# Patient Record
Sex: Male | Born: 1995 | Race: Black or African American | Hispanic: No | Marital: Single | Smoking: Current every day smoker
Health system: Southern US, Community
[De-identification: ages and names within clinical notes are randomized; demographics above are authoritative.]

## PROBLEM LIST (undated history)

## (undated) DIAGNOSIS — I480 Paroxysmal atrial fibrillation: Secondary | ICD-10-CM

## (undated) DIAGNOSIS — F17200 Nicotine dependence, unspecified, uncomplicated: Secondary | ICD-10-CM

## (undated) DIAGNOSIS — I10 Essential (primary) hypertension: Secondary | ICD-10-CM

## (undated) DIAGNOSIS — K219 Gastro-esophageal reflux disease without esophagitis: Secondary | ICD-10-CM

## (undated) DIAGNOSIS — N189 Chronic kidney disease, unspecified: Secondary | ICD-10-CM

## (undated) DIAGNOSIS — I700 Atherosclerosis of aorta: Secondary | ICD-10-CM

## (undated) DIAGNOSIS — I509 Heart failure, unspecified: Secondary | ICD-10-CM

## (undated) DIAGNOSIS — F1290 Cannabis use, unspecified, uncomplicated: Secondary | ICD-10-CM

## (undated) DIAGNOSIS — N186 End stage renal disease: Secondary | ICD-10-CM

## (undated) DIAGNOSIS — D696 Thrombocytopenia, unspecified: Secondary | ICD-10-CM

## (undated) DIAGNOSIS — K3184 Gastroparesis: Secondary | ICD-10-CM

## (undated) DIAGNOSIS — D631 Anemia in chronic kidney disease: Secondary | ICD-10-CM

## (undated) DIAGNOSIS — E139 Other specified diabetes mellitus without complications: Secondary | ICD-10-CM

## (undated) DIAGNOSIS — K611 Rectal abscess: Secondary | ICD-10-CM

## (undated) DIAGNOSIS — N184 Chronic kidney disease, stage 4 (severe): Secondary | ICD-10-CM

## (undated) DIAGNOSIS — G931 Anoxic brain damage, not elsewhere classified: Secondary | ICD-10-CM

## (undated) DIAGNOSIS — F32A Depression, unspecified: Secondary | ICD-10-CM

## (undated) DIAGNOSIS — R112 Nausea with vomiting, unspecified: Secondary | ICD-10-CM

## (undated) DIAGNOSIS — R1116 Cannabis hyperemesis syndrome: Secondary | ICD-10-CM

## (undated) DIAGNOSIS — F419 Anxiety disorder, unspecified: Secondary | ICD-10-CM

## (undated) HISTORY — DX: Rectal abscess: K61.1

## (undated) HISTORY — PX: OTHER SURGICAL HISTORY: SHX169

---
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
## 2013-08-05 ENCOUNTER — Ambulatory Visit: Payer: Self-pay | Admitting: Surgery

## 2013-08-05 LAB — CBC WITH DIFFERENTIAL/PLATELET
BASOS PCT: 1 %
Basophil #: 0.2 10*3/uL — ABNORMAL HIGH (ref 0.0–0.1)
EOS ABS: 0.1 10*3/uL (ref 0.0–0.7)
Eosinophil %: 0.8 %
HCT: 39.7 % — AB (ref 40.0–52.0)
HGB: 13.5 g/dL (ref 13.0–18.0)
LYMPHS ABS: 1.4 10*3/uL (ref 1.0–3.6)
LYMPHS PCT: 8.5 %
MCH: 28.1 pg (ref 26.0–34.0)
MCHC: 34.1 g/dL (ref 32.0–36.0)
MCV: 83 fL (ref 80–100)
MONO ABS: 1.1 x10 3/mm — AB (ref 0.2–1.0)
MONOS PCT: 6.5 %
NEUTROS ABS: 14.2 10*3/uL — AB (ref 1.4–6.5)
NEUTROS PCT: 83.2 %
PLATELETS: 309 10*3/uL (ref 150–440)
RBC: 4.81 10*6/uL (ref 4.40–5.90)
RDW: 13.5 % (ref 11.5–14.5)
WBC: 17.1 10*3/uL — AB (ref 3.8–10.6)

## 2013-08-05 LAB — BASIC METABOLIC PANEL
ANION GAP: 6 — AB (ref 7–16)
BUN: 5 mg/dL — ABNORMAL LOW (ref 7–18)
CHLORIDE: 99 mmol/L (ref 98–107)
CREATININE: 0.62 mg/dL (ref 0.60–1.30)
Calcium, Total: 9.4 mg/dL (ref 9.0–10.7)
Co2: 28 mmol/L (ref 21–32)
GLUCOSE: 301 mg/dL — AB (ref 65–99)
Osmolality: 275 (ref 275–301)
Potassium: 3.8 mmol/L (ref 3.5–5.1)
SODIUM: 133 mmol/L — AB (ref 136–145)

## 2013-08-05 LAB — HEPATIC FUNCTION PANEL A (ARMC)
ALBUMIN: 2.9 g/dL — AB (ref 3.8–5.6)
ALT: 10 U/L — AB (ref 12–78)
AST: 10 U/L (ref 10–41)
Alkaline Phosphatase: 173 U/L — ABNORMAL HIGH
BILIRUBIN TOTAL: 1.4 mg/dL — AB (ref 0.2–1.0)
Bilirubin, Direct: 0.4 mg/dL — ABNORMAL HIGH (ref 0.00–0.20)
Total Protein: 7.8 g/dL (ref 6.4–8.6)

## 2013-08-05 LAB — PROTIME-INR
INR: 1.1
PROTHROMBIN TIME: 13.6 s (ref 11.5–14.7)

## 2013-08-06 LAB — HEMOGLOBIN A1C: HEMOGLOBIN A1C: 12.9 % — AB (ref 4.2–6.3)

## 2013-08-07 LAB — CBC WITH DIFFERENTIAL/PLATELET
BASOS PCT: 0.8 %
Basophil #: 0.1 10*3/uL (ref 0.0–0.1)
Eosinophil #: 0.3 10*3/uL (ref 0.0–0.7)
Eosinophil %: 2.3 %
HCT: 34 % — ABNORMAL LOW (ref 40.0–52.0)
HGB: 11.5 g/dL — ABNORMAL LOW (ref 13.0–18.0)
LYMPHS ABS: 1.8 10*3/uL (ref 1.0–3.6)
LYMPHS PCT: 15.2 %
MCH: 28 pg (ref 26.0–34.0)
MCHC: 34 g/dL (ref 32.0–36.0)
MCV: 83 fL (ref 80–100)
MONOS PCT: 6.7 %
Monocyte #: 0.8 x10 3/mm (ref 0.2–1.0)
NEUTROS ABS: 9 10*3/uL — AB (ref 1.4–6.5)
NEUTROS PCT: 75 %
PLATELETS: 321 10*3/uL (ref 150–440)
RBC: 4.12 10*6/uL — ABNORMAL LOW (ref 4.40–5.90)
RDW: 13.5 % (ref 11.5–14.5)
WBC: 12 10*3/uL — AB (ref 3.8–10.6)

## 2013-08-09 LAB — WOUND CULTURE

## 2013-08-10 LAB — CULTURE, BLOOD (SINGLE)

## 2014-09-04 NOTE — H&P (Signed)
   Subjective/Chief Complaint Right breast swelling pain   History of Present Illness Jonathan Mcmillan is a pleasant 19 yo M with a history of diabetes with noncompliance who presents with 1 week of breast swelling and pain.  He says that it began 1 week ago and has progressively gotten larger.  He says that he has a history of prior abscesses in other places in the past.  Was doing well prior to this.  Has just began insulin again via [facility] at [facility].   Past Med/Surgical Hx:  HTN:   Diabetes Mellitus, Type II (NIDD):   denies surg hx:   ALLERGIES:  NKA: None  Family and Social History:  Family History Non-Contributory   Social History negative tobacco, negative ETOH, negative Illicit drugs   Review of Systems:  Subjective/Chief Complaint Riight breast swelling, pain   Fever/Chills No   Cough No   Sputum No   Abdominal Pain No   Diarrhea No   Constipation No   Nausea/Vomiting No   SOB/DOE No   Chest Pain No   Dysuria No   Tolerating Diet No   Physical Exam:  GEN well developed, well nourished, no acute distress   HEENT pink conjunctivae, PERRL, good dentition   RESP normal resp effort  clear BS  no use of accessory muscles  wheezing  Right breast swelling, pain   CARD regular rate  no murmur  no thrills  No LE edema  no JVD  no Rub   ABD denies tenderness  no hernia  soft  normal BS   SKIN normal to palpation, No rashes, No ulcers, skin turgor poor   NEURO cranial nerves deficit, negative rigidity, negative tremor   PSYCH A+O to time, place, person, good insight    Assessment/Admission Diagnosis 19 yo M with large right breast abscess, leukocytosis.   Plan To OR for I and D of right breast abscess.   Electronic Signatures: Floyde Parkins (MD)  (Signed 25-Mar-15 15:51)  Authored: CHIEF COMPLAINT and HISTORY, PAST MEDICAL/SURGIAL HISTORY, ALLERGIES, FAMILY AND SOCIAL HISTORY, REVIEW OF SYSTEMS, PHYSICAL EXAM, ASSESSMENT AND PLAN   Last  Updated: 25-Mar-15 15:51 by Floyde Parkins (MD)

## 2014-09-04 NOTE — Consult Note (Signed)
PATIENT NAME:  Jonathan Mcmillan, Jonathan Mcmillan MR#:  [id number] DATE OF BIRTH:  [birth year]  DATE OF CONSULTATION:  08/06/2013  REFERRING PHYSICIAN:  Dr. Rexene Edison. CONSULTING PHYSICIAN:  Nasha Diss A. Posey Pronto, MD  PRIMARY CARE PHYSICIAN:   [facility].   REASON FOR CONSULT:  Diabetes management.  The patient is postop.  Jonathan Mcmillan is a 19 year old African American gentleman with past medical history of type 1 diabetes who has been noncompliant with his insulin, who was recently started back on long-acting insulin along with NovoLog, was admitted on the surgical floor secondary to patient developing a large right breast abscess. The patient is postop day 1 I and D done by Dr. Rexene Edison. He is currently getting IV clindamycin. Internal medicine was consulted for management of diabetes. The patient's sugars have been running anywhere from 232 to 431. The patient tells me he has been noncompliant with his insulin; however, he recently was started on insulin Lantus and NovoLog, which he takes at home. He denies any other complaints and is requesting to go home.   PAST MEDICAL HISTORY: 1.  Hypertension, not on any medications.  2.  Type 1 diabetes. The patient was diagnosed with type 1 diabetes in 2008.   MEDICATIONS: 1.  Lantus 25 units at bedtime.  2.  NovoLog 5 units at suppertime.   FAMILY HISTORY: Positive for diabetes.   ALLERGIES: No known drug allergies.   SOCIAL HISTORY: The patient is a high Retail buyer. He smokes every day. He reports a pack lasts him about a week. He smokes "pot" on and off. Denies alcohol use or any other IV drug use.   REVIEW OF SYSTEMS:  CONSTITUTIONAL: No fever, fatigue, weakness.  EYES: No blurred or double vision, glaucoma or cataracts.  ENT: No tinnitus, ear pain, hearing loss or postnasal drip.  RESPIRATORY: No cough, wheeze, hemoptysis or COPD.  CARDIOVASCULAR: No chest pain, orthopnea, edema or hypertension.  GASTROINTESTINAL: No nausea, vomiting, diarrhea,  abdominal pain. No GERD.  GENITOURINARY: No dysuria, hematuria or frequency.  ENDOCRINE: No polyuria, nocturia or thyroid problems.  HEMATOLOGY: No anemia or easy bruising or bleeding.  SKIN: No acne, rash or any lesions.  MUSCULOSKELETAL: No arthritis, swelling or gout.  NEUROLOGIC: No CVA, TIA or vertigo.  PSYCHIATRIC: No anxiety, depression or bipolar disorder. All other systems reviewed and negative.   PHYSICAL EXAMINATION: GENERAL: The patient is awake, alert, oriented x 3, not in acute distress.  VITAL SIGNS: He is afebrile. Pulse is 79, blood pressure 123/71. Sats are 99% on room air.  HEENT: Atraumatic, normocephalic. Pupils: PERRLA. EOM intact. Oral mucosa is moist.  NECK: Supple. No JVD. No carotid bruit.  RESPIRATORY: Clear to auscultation bilaterally. No rales, rhonchi, respiratory distress or labored breathing.  CARDIOVASCULAR: Both the heart tones are normal. Rate, rhythm regular. PMI not lateralized. Chest nontender. Good pedal pulses. Good femoral pulses. No lower extremity edema.  ABDOMEN: Soft, benign, nontender. No organomegaly. Positive bowel sounds.  NEUROLOGIC: Grossly intact cranial nerves II through XII. No motor or sensory deficits.  SKIN: The patient does have a surgical dressing over the upper part of the chest with some drainage secondary to I and D of the right breast abscess.  PSYCHIATRIC: The patient is awake, alert, oriented x 3.   Last blood glucose was 291.  Wound culture colonies too small to read.  BUN is 5. Creatinine is 0.62, sodium 133, potassium 3.8. Chloride is 99. Bicarb is 28. Calcium is 9.4. White count is 17.1. H and H are  13.5 and 39.7. Platelet count is 309. Blood cultures: No growth in 8 to 12 hours. SGPT is 10. SGOT is 10. Alk phos is 173. PT/INR are 13.6 and 1.1. Hemoglobin A1c is 12.9.   ASSESSMENT: A 19 year old Jonathan Mcmillan came in with large right breast abscess status post incision and drainage. Internal medicine consulted for:  1.  Postop management of uncontrolled type 1 diabetes, appears secondary to noncompliance, and the patient's current infection, which is right breast abscess status post incision and drainage. The patient reports noncompliance of insulin, however, recently was started by his primary M.D. as [facility] on Lantus and NovoLog. We will resume Lantus 25 units at bedtime and I  will start him on NovoLog 8 units t.i.d. a.c., along with sliding scale. His A1c is 12.9. The patient was advised on lifestyle, compliance with insulin and follow up with primary care for better sugar control. We will adjust insulin depending on the patient's blood sugars.  2.  Right breast abscess status post incision and drainage per Dr. Rexene Edison. The patient is currently on IV clindamycin. His wound cultures show colonies are too small at this time.  3.  Tobacco abuse. The patient was counseled. About 4 minutes spent on smoking cessation. The patient appears not to be motivated at this time to quit smoking.   Thank you for the consult. We will follow while the patient is in house.   TIME SPENT:  45 minutes.    ____________________________ Hart Rochester Posey Pronto, MD sap:dmm D: 08/06/2013 11:08:27 ET T: 08/06/2013 11:52:38 ET JOB#: [id number]  cc: [name] A. Posey Pronto, MD, <Dictator> [place] MD ELECTRONICALLY SIGNED 08/20/2013 16:23

## 2014-09-04 NOTE — Op Note (Signed)
PATIENT NAME:  Jonathan Mcmillan, Jonathan Mcmillan MR#:  [id number] DATE OF BIRTH:  07-22-1995  DATE OF PROCEDURE:  08/06/2013  PREOPERATIVE DIAGNOSIS: Right breast abscess.   POSTOPERATIVE DIAGNOSIS: Right breast abscess.  PROCEDURE PERFORMED:  Incision and drainage of right breast abscess.   ANESTHESIA: General endotracheal.   ESTIMATED BLOOD LOSS:  5 mL.  COMPLICATIONS: None.   SPECIMEN:  Purlence sent for gram stain, culture and sensitivity  [name] is a pleasant 19 year old with a history of diabetes mellitus who presented with 1 week of enlarging right breast which was quite tender and erythematous. He also had leukocytosis and elevated blood sugars. He was brought to the operating room suite for incision and drainage of breast abscess.   DETAILS OF PROCEDURE: Are as follows:  Informed consent was obtained. [name] was brought to the operating room suite. He was induced. Endotracheal tube was placed; general anesthesia was administered. His right chest was then prepped and draped in standard surgical fashion. A timeout was then performed correctly identifying the patient name, operative site and procedure to be performed. An incision was made at the most inferomedial aspect of his right breast. Purulence was obtained. I then used a hemostat which showed a large cavity in his right breast. I then made counter incisions at the most lateral aspects of the inferior and superior part of the abscess. I then used 2 quarter inch Penrose drains to prevent this cavity from closing through the skin sites. These were sutured in place with 3-0 nylon. The wound was then packed with Kerlix gauze. The patient was then awoken, extubated and brought to the postanesthesia care unit after a sterile dressing was placed. There were no immediate complications. Needle, sponge and instrument counts were correct at the end of the procedure.    ____________________________ Glena Norfolk. Khaliah Barnick,  MD cal:dmm D: 08/06/2013 16:12:46 ET T: 08/06/2013 19:33:07 ET JOB#: [id number]  cc: Harrell Gave A. Ralphie Lovelady, MD, <Dictator> Floyde Parkins MD ELECTRONICALLY SIGNED 08/12/2013 12:26

## 2016-05-21 ENCOUNTER — Emergency Department: Payer: Self-pay

## 2016-05-21 ENCOUNTER — Inpatient Hospital Stay
Admission: EM | Admit: 2016-05-21 | Discharge: 2016-05-24 | DRG: 638 | Disposition: A | Discharge status: Home / Self Care | Payer: Self-pay | Attending: Internal Medicine | Admitting: Internal Medicine

## 2016-05-21 ENCOUNTER — Encounter: Payer: Self-pay | Admitting: Internal Medicine

## 2016-05-21 DIAGNOSIS — E1110 Type 2 diabetes mellitus with ketoacidosis without coma: Secondary | ICD-10-CM

## 2016-05-21 DIAGNOSIS — Z681 Body mass index (BMI) 19 or less, adult: Secondary | ICD-10-CM

## 2016-05-21 DIAGNOSIS — N179 Acute kidney failure, unspecified: Secondary | ICD-10-CM | POA: Diagnosis present

## 2016-05-21 DIAGNOSIS — Z9114 Patient's other noncompliance with medication regimen: Secondary | ICD-10-CM

## 2016-05-21 DIAGNOSIS — R000 Tachycardia, unspecified: Secondary | ICD-10-CM | POA: Diagnosis present

## 2016-05-21 DIAGNOSIS — E860 Dehydration: Secondary | ICD-10-CM | POA: Diagnosis present

## 2016-05-21 DIAGNOSIS — E10649 Type 1 diabetes mellitus with hypoglycemia without coma: Secondary | ICD-10-CM | POA: Diagnosis not present

## 2016-05-21 DIAGNOSIS — E876 Hypokalemia: Secondary | ICD-10-CM | POA: Diagnosis not present

## 2016-05-21 DIAGNOSIS — E440 Moderate protein-calorie malnutrition: Secondary | ICD-10-CM | POA: Diagnosis present

## 2016-05-21 DIAGNOSIS — Z23 Encounter for immunization: Secondary | ICD-10-CM

## 2016-05-21 DIAGNOSIS — T383X6A Underdosing of insulin and oral hypoglycemic [antidiabetic] drugs, initial encounter: Secondary | ICD-10-CM | POA: Diagnosis present

## 2016-05-21 DIAGNOSIS — F1210 Cannabis abuse, uncomplicated: Secondary | ICD-10-CM | POA: Diagnosis present

## 2016-05-21 DIAGNOSIS — E1010 Type 1 diabetes mellitus with ketoacidosis without coma: Principal | ICD-10-CM | POA: Diagnosis present

## 2016-05-21 DIAGNOSIS — R531 Weakness: Secondary | ICD-10-CM

## 2016-05-21 DIAGNOSIS — F17210 Nicotine dependence, cigarettes, uncomplicated: Secondary | ICD-10-CM | POA: Diagnosis present

## 2016-05-21 DIAGNOSIS — R739 Hyperglycemia, unspecified: Secondary | ICD-10-CM

## 2016-05-21 DIAGNOSIS — E870 Hyperosmolality and hypernatremia: Secondary | ICD-10-CM | POA: Diagnosis not present

## 2016-05-21 DIAGNOSIS — E875 Hyperkalemia: Secondary | ICD-10-CM | POA: Diagnosis present

## 2016-05-21 DIAGNOSIS — R55 Syncope and collapse: Secondary | ICD-10-CM | POA: Diagnosis present

## 2016-05-21 HISTORY — DX: Other specified diabetes mellitus without complications: E13.9

## 2016-05-21 HISTORY — DX: Type 2 diabetes mellitus with ketoacidosis without coma: E11.10

## 2016-05-21 LAB — CBC WITH DIFFERENTIAL/PLATELET
BASOS PCT: 1 %
Basophils Absolute: 0.1 10*3/uL (ref 0–0.1)
EOS ABS: 0 10*3/uL (ref 0–0.7)
Eosinophils Relative: 0 %
HEMATOCRIT: 56.5 % — AB (ref 40.0–52.0)
HEMOGLOBIN: 17.4 g/dL (ref 13.0–18.0)
Lymphocytes Relative: 12 %
Lymphs Abs: 1.4 10*3/uL (ref 1.0–3.6)
MCH: 30.1 pg (ref 26.0–34.0)
MCHC: 30.8 g/dL — AB (ref 32.0–36.0)
MCV: 97.7 fL (ref 80.0–100.0)
MONOS PCT: 6 %
Monocytes Absolute: 0.7 10*3/uL (ref 0.2–1.0)
NEUTROS ABS: 9.2 10*3/uL — AB (ref 1.4–6.5)
NEUTROS PCT: 81 %
Platelets: 343 10*3/uL (ref 150–440)
RBC: 5.78 MIL/uL (ref 4.40–5.90)
RDW: 13.5 % (ref 11.5–14.5)
WBC: 11.4 10*3/uL — AB (ref 3.8–10.6)

## 2016-05-21 LAB — BASIC METABOLIC PANEL
ANION GAP: 20 — AB (ref 5–15)
ANION GAP: 12 (ref 5–15)
Anion gap: 27 — ABNORMAL HIGH (ref 5–15)
BUN: 35 mg/dL — ABNORMAL HIGH (ref 6–20)
BUN: 33 mg/dL — ABNORMAL HIGH (ref 6–20)
BUN: 31 mg/dL — ABNORMAL HIGH (ref 6–20)
BUN: 31 mg/dL — ABNORMAL HIGH (ref 6–20)
CALCIUM: 9.2 mg/dL (ref 8.9–10.3)
CALCIUM: 9.7 mg/dL (ref 8.9–10.3)
CALCIUM: 9.8 mg/dL (ref 8.9–10.3)
CALCIUM: 9.4 mg/dL (ref 8.9–10.3)
CO2: 9 mmol/L — AB (ref 22–32)
CO2: 15 mmol/L — AB (ref 22–32)
CO2: 18 mmol/L — ABNORMAL LOW (ref 22–32)
Chloride: 111 mmol/L (ref 101–111)
Chloride: 116 mmol/L — ABNORMAL HIGH (ref 101–111)
Chloride: 121 mmol/L — ABNORMAL HIGH (ref 101–111)
Chloride: 127 mmol/L — ABNORMAL HIGH (ref 101–111)
Creatinine, Ser: 1.78 mg/dL — ABNORMAL HIGH (ref 0.61–1.24)
Creatinine, Ser: 1.68 mg/dL — ABNORMAL HIGH (ref 0.61–1.24)
Creatinine, Ser: 1.44 mg/dL — ABNORMAL HIGH (ref 0.61–1.24)
Creatinine, Ser: 1.26 mg/dL — ABNORMAL HIGH (ref 0.61–1.24)
GFR calc Af Amer: >60 mL/min (ref >60)
GFR calc non Af Amer: 53 mL/min — ABNORMAL LOW (ref >60)
GFR calc non Af Amer: 56 mL/min — ABNORMAL LOW (ref >60)
GFR calc non Af Amer: >60 mL/min (ref >60)
Glucose, Bld: 779 mg/dL (ref 65–99)
Glucose, Bld: 557 mg/dL (ref 65–99)
Glucose, Bld: 426 mg/dL — ABNORMAL HIGH (ref 65–99)
Glucose, Bld: 288 mg/dL — ABNORMAL HIGH (ref 65–99)
POTASSIUM: 6.4 mmol/L — AB (ref 3.5–5.1)
Potassium: 5.4 mmol/L — ABNORMAL HIGH (ref 3.5–5.1)
Potassium: 4.8 mmol/L (ref 3.5–5.1)
Potassium: 3.8 mmol/L (ref 3.5–5.1)
Sodium: 147 mmol/L — ABNORMAL HIGH (ref 135–145)
Sodium: 152 mmol/L — ABNORMAL HIGH (ref 135–145)
Sodium: 156 mmol/L — ABNORMAL HIGH (ref 135–145)
Sodium: 157 mmol/L — ABNORMAL HIGH (ref 135–145)

## 2016-05-21 LAB — URINE DRUG SCREEN, QUALITATIVE (ARMC ONLY)
Amphetamines, Ur Screen: NOT DETECTED
Barbiturates, Ur Screen: NOT DETECTED
Benzodiazepine, Ur Scrn: NOT DETECTED
Cannabinoid 50 Ng, Ur ~~LOC~~: POSITIVE — AB
Cocaine Metabolite,Ur ~~LOC~~: NOT DETECTED
MDMA (Ecstasy)Ur Screen: NOT DETECTED
Methadone Scn, Ur: NOT DETECTED
Opiate, Ur Screen: NOT DETECTED
Phencyclidine (PCP) Ur S: NOT DETECTED
Tricyclic, Ur Screen: NOT DETECTED

## 2016-05-21 LAB — GLUCOSE, CAPILLARY
GLUCOSE-CAPILLARY: 484 mg/dL — AB (ref 65–99)
GLUCOSE-CAPILLARY: 441 mg/dL — AB (ref 65–99)
GLUCOSE-CAPILLARY: 378 mg/dL — AB (ref 65–99)
Glucose-Capillary: 185 mg/dL — ABNORMAL HIGH (ref 65–99)
Glucose-Capillary: 238 mg/dL — ABNORMAL HIGH (ref 65–99)
Glucose-Capillary: 312 mg/dL — ABNORMAL HIGH (ref 65–99)
Glucose-Capillary: 338 mg/dL — ABNORMAL HIGH (ref 65–99)
Glucose-Capillary: 375 mg/dL — ABNORMAL HIGH (ref 65–99)
Glucose-Capillary: 463 mg/dL — ABNORMAL HIGH (ref 65–99)
Glucose-Capillary: 468 mg/dL — ABNORMAL HIGH (ref 65–99)
Glucose-Capillary: 524 mg/dL (ref 65–99)
Glucose-Capillary: 579 mg/dL (ref 65–99)
Glucose-Capillary: >600 mg/dL (ref 65–99)

## 2016-05-21 LAB — COMPREHENSIVE METABOLIC PANEL WITH GFR
ALT: 44 U/L (ref 17–63)
AST: 11 U/L — ABNORMAL LOW (ref 15–41)
Albumin: 4.4 g/dL (ref 3.5–5.0)
Alkaline Phosphatase: 165 U/L — ABNORMAL HIGH (ref 38–126)
Anion gap: 26 — ABNORMAL HIGH (ref 5–15)
BUN: 28 mg/dL — ABNORMAL HIGH (ref 6–20)
CO2: 12 mmol/L — ABNORMAL LOW (ref 22–32)
Calcium: 9.5 mg/dL (ref 8.9–10.3)
Chloride: 106 mmol/L (ref 101–111)
Creatinine, Ser: 1.69 mg/dL — ABNORMAL HIGH (ref 0.61–1.24)
GFR calc Af Amer: >60 mL/min
GFR calc non Af Amer: 56 mL/min — ABNORMAL LOW
Glucose, Bld: 825 mg/dL (ref 65–99)
Potassium: 5.1 mmol/L (ref 3.5–5.1)
Sodium: 144 mmol/L (ref 135–145)
Total Bilirubin: 2.4 mg/dL — ABNORMAL HIGH (ref 0.3–1.2)
Total Protein: 8.1 g/dL (ref 6.5–8.1)

## 2016-05-21 LAB — URINALYSIS, COMPLETE (UACMP) WITH MICROSCOPIC
Bacteria, UA: NONE SEEN
Bilirubin Urine: NEGATIVE
Glucose, UA: >=500 mg/dL — AB
Ketones, ur: 80 mg/dL — AB
Leukocytes, UA: NEGATIVE
Nitrite: NEGATIVE
Protein, ur: 30 mg/dL — AB
RBC / HPF: NONE SEEN RBC/hpf (ref 0–5)
Specific Gravity, Urine: 1.028 (ref 1.005–1.030)
Squamous Epithelial / HPF: NONE SEEN
WBC, UA: NONE SEEN WBC/hpf (ref 0–5)
pH: 6 (ref 5.0–8.0)

## 2016-05-21 LAB — TROPONIN I: Troponin I: <0.03 ng/mL (ref <0.03)

## 2016-05-21 LAB — MRSA PCR SCREENING: MRSA by PCR: NEGATIVE

## 2016-05-21 LAB — BLOOD GAS, VENOUS
Acid-base deficit: 19.8 mmol/L — ABNORMAL HIGH (ref 0.0–2.0)
BICARBONATE: 9.6 mmol/L — AB (ref 20.0–28.0)
O2 Saturation: 49.8 %
PCO2 VEN: 34 mmHg — AB (ref 44.0–60.0)
PH VEN: 7.06 — AB (ref 7.250–7.430)
PO2 VEN: 40 mmHg (ref 32.0–45.0)
Patient temperature: 37

## 2016-05-21 LAB — ETHANOL: Alcohol, Ethyl (B): <5 mg/dL

## 2016-05-21 LAB — BETA-HYDROXYBUTYRIC ACID: Beta-Hydroxybutyric Acid: >8 mmol/L — ABNORMAL HIGH (ref 0.05–0.27)

## 2016-05-21 MED ORDER — ONDANSETRON HCL 4 MG/2ML IJ SOLN: 4.0000 mg | Freq: Four times a day (QID) | INTRAMUSCULAR | Status: DC | PRN

## 2016-05-21 MED ORDER — SODIUM CHLORIDE 0.9 % IV BOLUS (SEPSIS)
1000.0000 mL | Freq: Once | INTRAVENOUS | Status: AC
  Administered 2016-05-21: 1000 mL via INTRAVENOUS

## 2016-05-21 MED ORDER — ENOXAPARIN SODIUM 40 MG/0.4ML ~~LOC~~ SOLN
40.0000 mg | SUBCUTANEOUS | Status: DC
  Administered 2016-05-21 – 2016-05-23 (×3): 40 mg via SUBCUTANEOUS
  Filled 2016-05-21 (×4): qty 0.4

## 2016-05-21 MED ORDER — SODIUM CHLORIDE 0.9 % IV SOLN: INTRAVENOUS | Status: AC

## 2016-05-21 MED ORDER — SODIUM CHLORIDE 0.9 % IV SOLN
INTRAVENOUS | Status: DC
  Administered 2016-05-21: 5.4 [IU]/h via INTRAVENOUS
  Filled 2016-05-21 (×3): qty 2.5

## 2016-05-21 MED ORDER — SODIUM CHLORIDE 0.9 % IV SOLN
30.0000 meq | Freq: Once | INTRAVENOUS | Status: AC
  Administered 2016-05-21: 30 meq via INTRAVENOUS
  Filled 2016-05-21: qty 15

## 2016-05-21 MED ORDER — SODIUM CHLORIDE 0.9 % IV SOLN
INTRAVENOUS | Status: DC
  Filled 2016-05-21: qty 2.5

## 2016-05-21 MED ORDER — SODIUM CHLORIDE 0.9 % IV SOLN
INTRAVENOUS | Status: DC
  Administered 2016-05-21 (×2): 150 mL/h via INTRAVENOUS

## 2016-05-21 MED ORDER — SODIUM CHLORIDE 0.9 % IV BOLUS (SEPSIS): 1000.0000 mL | Freq: Once | INTRAVENOUS | Status: DC

## 2016-05-21 MED ORDER — DEXTROSE-NACL 5-0.45 % IV SOLN
INTRAVENOUS | Status: DC
  Administered 2016-05-21: 23:00:00 via INTRAVENOUS
  Administered 2016-05-22: 125 mL/h via INTRAVENOUS

## 2016-05-21 NOTE — Progress Notes (Signed)
Went by to speak with patient about DM and medication needs. Patient is very drowsy and keeps falling back to sleep. Patient looks cachectic and patient reports he has lost "alot of weight". Patient providing short responses to questions asked. Asked about how long it has been since he took insulin and patient reports that it has "been about 2 years". Patient stated he would like to take a pill for DM. Explained that patient likely needs insulin as oral DM medications do not work for Type 1 diabetes since the body is not able to produce any insulin and insulin is absolutely required for DM control. Patient reported that he lives with his grandmother, uncles, and aunts and "we all have diabetes".  Informed patient diabetes coordinator will visit with him again tomorrow when he is more alert. Patient is currently on IV insulin per Glucostabilizer DKA protocol and glucose 579 mg/dl at 11:41 today. Will follow up with patient on 05/22/16.  Thanks, Barnie Alderman, RN, MSN, CDE Diabetes Coordinator Inpatient Diabetes Program [phone] (Team Pager from 8am to 5pm)

## 2016-05-21 NOTE — ED Triage Notes (Signed)
EMS pt to rm 26 from home with report of pt not feeling well and weak. Pt reports he passed out. Pt has diabetes and has not taken medication for 2 to 3 years. EMS reports FSBS 542 BP 127/88 and o2 saat of 97%

## 2016-05-21 NOTE — Progress Notes (Signed)
Admitted this morning because of DKA. Not taking insulin for last 2 years. Has DKA, continue insulin drip. Patient glucose still elevated to more than 500. Does have hyperkalemia. He is slightly lethargic.  Vitals reviewed, tachycardic.  #1 DKA in type 1 diabetes mellitus: Noncompliance with medication, not taking insulin for the past 2 years as per documents. Continue insulin drip, aggressive hydration, recheck the potassium levels, insulin drip should help with the hyperkalemia. Diabetes coordinator is a going to talk to patient when he is a more alert. His lethargy secondary to DKA,.  I spent 35 minutes(critical care time.)

## 2016-05-21 NOTE — Progress Notes (Addendum)
[place] for electrolyte management  Indication: DKA  Pharmacy consulted for electrolyte management for 21 yo male on DKA protocol. Patient currently receiving NS at 170mL/hr - per protocol. Patient has received three NS boluses in previous 14 hours.   Plan:   Patient's hyperkalemia has resolved. Will need to be aggressive with potassium supplementation while patient is on insulin drop.   Patient's sodium elevated. Will continue to monitor. If continues to climb, will need to contact primary team.   Patient ordered Q4hr BMP, next one due ~2200. Will continue to monitor with you.  0108 2200 electrolyte panel. No replacement indicated.  0109 0400 K+ 3.2. 40 mEq KCl PO ordered by MD.  No Known Allergies  Patient Measurements: Height: 5\' 11"  (180.3 cm) Weight: 102 lb 8.2 oz (46.5 kg) IBW/kg (Calculated) : 75.3   Vital Signs: Temp: 98.6 F (37 C) (01/08 1600) Temp Source: Oral (01/08 1600) BP: 129/89 (01/08 1700) Pulse Rate: 135 (01/08 1800) Intake/Output from previous day: No intake/output data recorded. Intake/Output from this shift: No intake/output data recorded.  Labs:  Recent Labs  05/21/16 0449  05/21/16 0547 05/21/16 0950 05/21/16 1417 05/21/16 1801  WBC 11.4*  --   --   --   --   --   HGB 17.4  --   --   --   --   --   HCT 56.5*  --   --   --   --   --   PLT 343  --   --   --   --   --   CREATININE  --   < > 1.69* 1.78* 1.68* 1.44*  ALBUMIN  --   --  4.4  --   --   --   PROT  --   --  8.1  --   --   --   AST  --   --  11*  --   --   --   ALT  --   --  44  --   --   --   ALKPHOS  --   --  165*  --   --   --   BILITOT  --   --  2.4*  --   --   --   < > = values in this interval not displayed. Estimated Creatinine Clearance: 52.9 mL/min (by C-G formula based on SCr of 1.44 mg/dL (H)).   Microbiology: Recent Results (from the past 720 hour(s))  MRSA PCR Screening     Status: None   Collection Time: 05/21/16   8:20 AM  Result Value Ref Range Status   MRSA by PCR NEGATIVE NEGATIVE Final    Comment:        The GeneXpert MRSA Assay (FDA approved for NASAL specimens only), is one component of a comprehensive MRSA colonization surveillance program. It is not intended to diagnose MRSA infection nor to guide or monitor treatment for MRSA infections.    Pharmacy will continue to monitor and adjust per consult.   Simpson,Michael L 05/21/2016,7:53 PM

## 2016-05-21 NOTE — Progress Notes (Signed)
Patient continues to have HR in 130's-140's. Call placed to [facility]. Spoke w/ Dr. Lamonte Sakai, who ordered 1L 0.9% NS bolus. Bolus started at 1645 hours. HR still sustained in the low 130's after bolus. Patient asymptomatic w/ this HR.

## 2016-05-21 NOTE — H&P (Addendum)
[facility] [place] NAME: Jonathan Mcmillan    MR#:  [id number]  DATE OF BIRTH:  1995-10-17  DATE OF ADMISSION:  05/21/2016  PRIMARY CARE PHYSICIAN: [place]   REQUESTING/REFERRING PHYSICIAN:   CHIEF COMPLAINT:   Chief Complaint  Patient presents with  . Weakness  . Hyperglycemia    HISTORY OF PRESENT ILLNESS: Jonathan Mcmillan  is a 21 y.o. male with a known history of Type 1 diabetes mellitus presented to the emergency room with generalized weakness. He also has some nausea and felt weak and about to pass out. Patient has been in and out of jail and not been on his diabetic medication. Workup in the emergency room showed elevated blood sugars and he was severely acidotic. Patient was given 2 L of IV fluid bolus in the emergency room and started on insulin drip. No complaints of any chest pain, shortness of breath. No history of any fever or chills or cough. Hospitalist service was consulted for further care of the patient. No abdominal pain. His ketones were positive in the urine and his blood sugar was greater than 800 mg/dL.  PAST MEDICAL HISTORY:   Past Medical History:  Diagnosis Date  . Diabetes 1.5, managed as type 1 ([place])     PAST SURGICAL HISTORY: Past Surgical History:  Procedure Laterality Date  . none      SOCIAL HISTORY:  Social History  Substance Use Topics  . Smoking status: Current Every Day Smoker  . Smokeless tobacco: Never Used  . Alcohol use Yes    FAMILY HISTORY:  Family History  Problem Relation Age of Onset  . Diabetes Mother     DRUG ALLERGIES: No Known Allergies  REVIEW OF SYSTEMS:   CONSTITUTIONAL: No fever, has weakness.  EYES: No blurred or double vision.  EARS, NOSE, AND THROAT: No tinnitus or ear pain.  RESPIRATORY: No cough, shortness of breath, wheezing or hemoptysis.  CARDIOVASCULAR: No chest pain, orthopnea, edema.  GASTROINTESTINAL: Has nausea, no vomiting, diarrhea or  abdominal pain.  GENITOURINARY: No dysuria, hematuria.  ENDOCRINE: Has polyuria, nocturia,  HEMATOLOGY: No anemia, easy bruising or bleeding SKIN: No rash or lesion. MUSCULOSKELETAL: No joint pain or arthritis.   NEUROLOGIC: No tingling, numbness, weakness.  PSYCHIATRY: No anxiety or depression.   MEDICATIONS AT HOME:  Prior to Admission medications   Medication Sig Start Date End Date Taking? Authorizing Provider  glucagon (GLUCAGON EMERGENCY) 1 MG injection Inject 1 mg into the vein once as needed.      Historical Provider, MD  Insulin Aspart (NOVOLOG FLEXPEN [place]) Inject into the skin.      Historical Provider, MD  insulin glargine (LANTUS) 100 UNIT/ML injection Inject 6.5 Units into the skin at bedtime.      Historical Provider, MD      PHYSICAL EXAMINATION:   VITAL SIGNS: Blood pressure 133/89, pulse (!) 130, temperature 98.2 F (36.8 C), temperature source Oral, resp. rate (!) 22, height 6\' 2"  (1.88 m), weight 61.2 kg (135 lb), SpO2 100 %.  GENERAL:  21 y.o.-year-old patient lying in the bed with no acute distress.  EYES: Pupils equal, round, reactive to light and accommodation. No scleral icterus. Extraocular muscles intact.  HEENT: Head atraumatic, normocephalic. Oropharynx dry and nasopharynx clear.  NECK:  Supple, no jugular venous distention. No thyroid enlargement, no tenderness.  LUNGS: Normal breath sounds bilaterally, no wheezing, rales,rhonchi or crepitation. No use of accessory muscles of respiration.  CARDIOVASCULAR: S1, S2  tachycardia noted. No murmurs, rubs, or gallops.  ABDOMEN: Soft, nontender, nondistended. Bowel sounds present. No organomegaly or mass.  EXTREMITIES: No pedal edema, cyanosis, or clubbing.  NEUROLOGIC: Cranial nerves II through XII are intact. Muscle strength 5/5 in all extremities. Sensation intact. Gait not checked.  PSYCHIATRIC: The patient is alert and oriented x 3.  SKIN: No obvious rash, lesion, or ulcer.   LABORATORY PANEL:    CBC  Recent Labs Lab 05/21/16 0449  WBC 11.4*  HGB 17.4  HCT 56.5*  PLT 343  MCV 97.7  MCH 30.1  MCHC 30.8*  RDW 13.5  LYMPHSABS 1.4  MONOABS 0.7  EOSABS 0.0  BASOSABS 0.1   ------------------------------------------------------------------------------------------------------------------  Chemistries  No results for input(s): NA, K, CL, CO2, GLUCOSE, BUN, CREATININE, CALCIUM, MG, AST, ALT, ALKPHOS, BILITOT in the last 168 hours.  Invalid input(s): GFRCGP ------------------------------------------------------------------------------------------------------------------ CrCl cannot be calculated (Patient's most recent lab result is older than the maximum 21 days allowed.). ------------------------------------------------------------------------------------------------------------------ No results for input(s): TSH, T4TOTAL, T3FREE, THYROIDAB in the last 72 hours.  Invalid input(s): FREET3   Coagulation profile No results for input(s): INR, PROTIME in the last 168 hours. ------------------------------------------------------------------------------------------------------------------- No results for input(s): DDIMER in the last 72 hours. -------------------------------------------------------------------------------------------------------------------  Cardiac Enzymes No results for input(s): CKMB, TROPONINI, MYOGLOBIN in the last 168 hours.  Invalid input(s): CK ------------------------------------------------------------------------------------------------------------------ Invalid input(s): POCBNP  ---------------------------------------------------------------------------------------------------------------  Urinalysis    Component Value Date/Time   COLORURINE STRAW (A) 05/21/2016 0556   APPEARANCEUR CLEAR (A) 05/21/2016 0556   LABSPEC 1.028 05/21/2016 0556   PHURINE 6.0 05/21/2016 0556   GLUCOSEU >=500 (A) 05/21/2016 0556   HGBUR SMALL (A) 05/21/2016 0556    BILIRUBINUR NEGATIVE 05/21/2016 0556   KETONESUR 80 (A) 05/21/2016 0556   PROTEINUR 30 (A) 05/21/2016 0556   UROBILINOGEN 0.2 05/10/2007 2155   NITRITE NEGATIVE 05/21/2016 0556   LEUKOCYTESUR NEGATIVE 05/21/2016 0556     RADIOLOGY: Ct Head Wo Contrast  Result Date: 05/21/2016 CLINICAL DATA:  21 y/o  M; syncope. EXAM: CT HEAD WITHOUT CONTRAST TECHNIQUE: Contiguous axial images were obtained from the base of the skull through the vertex without intravenous contrast. COMPARISON:  None. FINDINGS: Brain: No evidence of acute infarction, hemorrhage, hydrocephalus, extra-axial collection or mass lesion/mass effect. Vascular: No hyperdense vessel or unexpected calcification. Skull: Normal. Negative for fracture or focal lesion. Sinuses/Orbits: No acute finding. Other: None. IMPRESSION: No acute intracranial abnormality identified. Unremarkable CT of the head. Electronically Signed   By: Kristine Garbe M.D.   On: 05/21/2016 05:56   Dg Chest Port 1 View  Result Date: 05/21/2016 CLINICAL DATA:  Patient was found unresponsive this morning. EXAM: PORTABLE CHEST 1 VIEW COMPARISON:  01/26/2008 FINDINGS: Mild hyperinflation. Normal heart size and pulmonary vascularity. No focal airspace disease or consolidation in the lungs. No blunting of costophrenic angles. No pneumothorax. Mediastinal contours appear intact. IMPRESSION: No active disease. Electronically Signed   By: Lucienne Capers M.D.   On: 05/21/2016 06:11    EKG: Orders placed or performed during the hospital encounter of 05/21/16  . EKG 12-Lead  . EKG 12-Lead    IMPRESSION AND PLAN: 21 year old male patient with history of type 1 diabetes mellitus presented to the emergency room with generalized weakness. Admitting diagnosis 1. Diabetic ketoacidosis 2. Severe metabolic acidosis 3. Uncontrolled diabetes mellitus 4. Dehydration 5. Tachycardia 6. Hyperkalemia Treatment plan Admit patient to stepdown unit IV fluid hydration  aggressively IV insulin drip based on protocol Follow blood sugars every hourly Keep patient nothing by mouth Chem-7 every  4 hourly Diabetic education and medication adjustment Sequential compression devices to lower extremities for DVT prophylaxis.  All the records are reviewed and case discussed with ED provider. Management plans discussed with the patient, family and they are in agreement.  CODE STATUS:FULL CODE Code Status History    This patient does not have a recorded code status. Please follow your organizational policy for patients in this situation.       TOTAL CRITICAL CARE TIME TAKING CARE OF THIS PATIENT: 56 minutes.    Saundra Shelling M.D on 05/21/2016 at 6:59 AM  Between 7am to 6pm - Pager - [phone]  After 6pm go to [email or web address] - password [email or web address]  [facility]  Office  [phone]  CC: Primary care physician; [place]

## 2016-05-21 NOTE — ED Notes (Signed)
Critical ABG values given to Dr Beather Arbour.

## 2016-05-21 NOTE — ED Provider Notes (Signed)
[facility] Emergency Department Provider Note   ____________________________________________   First MD Initiated Contact with Patient 05/21/16 [phone]     (approximate)  I have reviewed the triage vital signs and the nursing notes.   HISTORY  Chief Complaint Weakness and Hyperglycemia  Limited by drowsiness  HPI Jonathan Mcmillan is a 21 y.o. male who presents to the ED from home via EMS with a chief complaint of generalized weakness, syncope and hyperglycemia. Patient has a history of diabetes who is not taking his medications for 2-3 years. His uncle reports seeing the patient stumble around and having syncopal episode.Rest of history is limited secondary to patient's drowsiness.   Past medical history Type 1 diabetes  Patient Active Problem List   Diagnosis Date Noted  . DKA (diabetic ketoacidoses) ([place]) 05/21/2016  . Type I (juvenile type) diabetes mellitus without mention of complication, uncontrolled 10/05/2010  . Hypertension 10/05/2010  . Obesity 10/05/2010    Past Surgical History:  Procedure Laterality Date  . none      Prior to Admission medications   Medication Sig Start Date End Date Taking? Authorizing Provider  glucagon (GLUCAGON EMERGENCY) 1 MG injection Inject 1 mg into the vein once as needed.      Historical Provider, MD  Insulin Aspart (NOVOLOG FLEXPEN ) Inject into the skin.      Historical Provider, MD  insulin glargine (LANTUS) 100 UNIT/ML injection Inject 6.5 Units into the skin at bedtime.      Historical Provider, MD    Allergies Patient has no known allergies.  Family History  Problem Relation Age of Onset  . Diabetes Mother     Social History Social History  Substance Use Topics  . Smoking status: Current Every Day Smoker  . Smokeless tobacco: Never Used  . Alcohol use Yes  Unknown  Review of Systems  Constitutional: Positive for generalized weakness. No fever/chills. Eyes: No visual changes. ENT:  No sore throat. Cardiovascular: Denies chest pain. Respiratory: Denies shortness of breath. Gastrointestinal: No abdominal pain.  No nausea, no vomiting.  No diarrhea.  No constipation. Genitourinary: Negative for dysuria. Musculoskeletal: Negative for back pain. Skin: Negative for rash. Neurological: Positive for syncope. Negative for headaches, focal weakness or numbness.  Limited secondary to decreased responsiveness; 10-point ROS otherwise negative.  ____________________________________________   PHYSICAL EXAM:  VITAL SIGNS: ED Triage Vitals  Enc Vitals Group     BP 05/21/16 0430 133/89     Pulse Rate 05/21/16 0444 (!) 129     Resp 05/21/16 0444 18     Temp 05/21/16 0444 98.2 F (36.8 C)     Temp Source 05/21/16 0444 Oral     SpO2 05/21/16 0444 100 %     Weight 05/21/16 0444 135 lb (61.2 kg)     Height 05/21/16 0444 6\' 2"  (1.88 m)     Head Circumference --      Peak Flow --      Pain Score 05/21/16 0445 4     Pain Loc --      Pain Edu? --      Excl. in [place]? --     Constitutional: Drowsy, awakens to voice. Ill appearing and in no acute distress. Eyes: Conjunctivae are normal. PERRL. EOMI. Head: Atraumatic. Nose: No congestion/rhinnorhea. Mouth/Throat: Mucous membranes are dry.  Oropharynx non-erythematous. Neck: No stridor.   Cardiovascular: Normal rate, regular rhythm. Grossly normal heart sounds.  Good peripheral circulation. Respiratory: Increased respiratory effort.  No retractions. Lungs CTAB. Gastrointestinal:  Soft and nontender. No distention. No abdominal bruits. No CVA tenderness. Musculoskeletal: No lower extremity tenderness nor edema.  No joint effusions. Neurologic:  Slurred speech and language. No gross focal neurologic deficits are appreciated.  Skin:  Skin is warm, dry and intact. No rash noted. Psychiatric: Mood and affect are normal. Speech and behavior are normal.  ____________________________________________   LABS (all labs ordered are  listed, but only abnormal results are displayed)  Labs Reviewed  CBC WITH DIFFERENTIAL/PLATELET - Abnormal; Notable for the following:       Result Value   WBC 11.4 (*)    HCT 56.5 (*)    MCHC 30.8 (*)    Neutro Abs 9.2 (*)    All other components within normal limits  URINALYSIS, COMPLETE (UACMP) WITH MICROSCOPIC - Abnormal; Notable for the following:    Color, Urine STRAW (*)    APPearance CLEAR (*)    Glucose, UA >=500 (*)    Hgb urine dipstick SMALL (*)    Ketones, ur 80 (*)    Protein, ur 30 (*)    All other components within normal limits  URINE DRUG SCREEN, QUALITATIVE ([facility] ONLY) - Abnormal; Notable for the following:    Cannabinoid 50 Ng, Ur [place] POSITIVE (*)    All other components within normal limits  BLOOD GAS, VENOUS - Abnormal; Notable for the following:    pH, Ven 7.06 (*)    pCO2, Ven 34 (*)    Bicarbonate 9.6 (*)    Acid-base deficit 19.8 (*)    All other components within normal limits  COMPREHENSIVE METABOLIC PANEL - Abnormal; Notable for the following:    CO2 12 (*)    Glucose, Bld 825 (*)    BUN 28 (*)    Creatinine, Ser 1.69 (*)    AST 11 (*)    Alkaline Phosphatase 165 (*)    Total Bilirubin 2.4 (*)    GFR calc non Af Amer 56 (*)    Anion gap 26 (*)    All other components within normal limits  ETHANOL  TROPONIN I  BETA-HYDROXYBUTYRIC ACID   ____________________________________________  EKG  ED ECG REPORT I, Stanlee Roehrig J, the attending physician, personally viewed and interpreted this ECG.   Date: 05/21/2016  EKG Time: 0442  Rate: 128  Rhythm: sinus tachycardia  Axis: Normal  Intervals:none  ST&T Change: Nonspecific  ____________________________________________  RADIOLOGY  Portable chest xray (viewed by me, interpreted per Dr. Gerilyn Nestle): No active disease.  CT Head interpreted per Dr. Toney Reil: No acute intracranial abnormality identified. Unremarkable CT of the  head.    ____________________________________________   PROCEDURES  Procedure(s) performed: None  Procedures  Critical Care performed: Yes, see critical care note(s)   CRITICAL CARE Performed by: Paulette Blanch   Total critical care time: 30 minutes  Critical care time was exclusive of separately billable procedures and treating other patients.  Critical care was necessary to treat or prevent imminent or life-threatening deterioration.  Critical care was time spent personally by me on the following activities: development of treatment plan with patient and/or surrogate as well as nursing, discussions with consultants, evaluation of patient's response to treatment, examination of patient, obtaining history from patient or surrogate, ordering and performing treatments and interventions, ordering and review of laboratory studies, ordering and review of radiographic studies, pulse oximetry and re-evaluation of patient's condition.  ____________________________________________   INITIAL IMPRESSION / ASSESSMENT AND PLAN / ED COURSE  Pertinent labs & imaging results that were available during my care of  the patient were reviewed by me and considered in my medical decision making (see chart for details).  21 year old male, type I diabetic, brought to the ED for generalized weakness and syncopal episode. He is drowsy but arousable to voice. Blood sugar in the 500s per EMS. Will initiate IV fluid resuscitation, obtain screening lab work and reassess. Will obtain CT head given story for syncope and decreased LOC. Anticipate patient hospital admission.  Clinical Course as of May 21 705  Mon May 21, 2016  5003 Discussed with hospitalist. Serum chemistries still pending. Will administer second liter IV bolus. Hospitalist recommends initiating insulin drip at this time.  [JS]    Clinical Course User Index [JS] Paulette Blanch, MD     ____________________________________________   FINAL CLINICAL  IMPRESSION(S) / ED DIAGNOSES  Final diagnoses:  Generalized weakness  Hyperglycemia  Syncope, unspecified syncope type  Diabetic ketoacidosis without coma associated with type 1 diabetes mellitus ([place])  Marijuana abuse      NEW MEDICATIONS STARTED DURING THIS VISIT:  New Prescriptions   No medications on file     Note:  This document was prepared using Dragon voice recognition software and may include unintentional dictation errors.    Paulette Blanch, MD 05/21/16 [phone]

## 2016-05-21 NOTE — Care Management Note (Signed)
Case Management Note  Patient Details  Name: Jonathan Mcmillan MRN: [id number] Date of Birth: 1995-11-10  Subjective/Objective:                   Met with patient to assess discharge planning needs. He said "yes" that he was from [facility] and that he doesn't have a PCP. He also said "yes" that he was safe to return to his home at discharge. He kept falling back to sleep. I contacted his grandmother Jonathan Mcmillan at [phone]. Patient apparently stays with her. She is determined that he will follow up with any referrals given however the patient accepted my offer from [facility] but this will need to be reiterated when he is more awake. I explained to his grandmother that patient has been given applications to [facility] and [facility]. She was appreciative "of any help we can give him". It is obvious that she is very concerned about him- just talking to her. I asked her if he had access to food/groceries and she said "he eats at that blame [employer] all the time. I tell him he has to take better care of himself but he don't listen".  Action/Plan:   Referral sent to [facility]. Medications prescriptions will be needed before the weekend to meet patient's Rx need.    Expected Discharge Date:                  Expected Discharge Plan:     In-House Referral:     Discharge planning Services  Medication Assistance, CM Consult, [facility]  Post Acute Care Choice:    Choice offered to:  Parent, Patient  DME Arranged:    DME Agency:     HH Arranged:    [place] Agency:     Status of Service:  In process, will continue to follow  If discussed at Long Length of Stay Meetings, dates discussed:    Additional Comments:  Marshell Garfinkel, RN 05/21/2016, 11:05 AM

## 2016-05-21 NOTE — Progress Notes (Signed)
Initial Nutrition Assessment  DOCUMENTATION CODES:   Severe malnutrition in context of chronic illness, Underweight  INTERVENTION:  Diet advancement per MD.  Will provide appropriate nutrition interventions when diet advanced and patient less lethargic.  If patient remains lethargic and NPO, may require NG for initiation of TF in setting of severe malnutrition with report of no intake for 1.5 weeks. Patient is at risk of refeeding syndrome.  NUTRITION DIAGNOSIS:   Inadequate oral intake related to inability to eat as evidenced by NPO status.  GOAL:   Patient will meet greater than or equal to 90% of their needs  MONITOR:   Diet advancement, Labs, Weight trends, I & O's  REASON FOR ASSESSMENT:   Other (Comment) (Low BMI)    ASSESSMENT:   21 y.o. male with a known history of Type 1 diabetes mellitus presented to the emergency room with generalized weakness. He also has some nausea and felt weak and about to pass out. Found to have DKA.   Spoke with patient at bedside but he was very lethargic so unable to provide much information. No family present at bedside. Patient reports he has not eaten in 1.5 weeks due to not feeling well. He also reports UBW 130 lbs, but was unable to answer over what period of time he has lost weight. Patient has lost 28 lbs (22% body weight) over unknown time period.   Discussed with RN who has been able to meet with family. Family has reported that patient is not compliant with medications or with diet and that he has lost a lot of weight quickly. Reports he eats constantly, just typically is junk food. Per review of other notes patient eats at [facility].   Medications reviewed and include: potassium chloride 30 mEq once today Iv, NS @ 150 ml/hr, regular insulin gtt.  Labs reviewed: CBG >600, Co2 12, Glucose 825, BUN 28, Creatinine 1.69, Anion gap 26, Alkaline Phosphatase 165, AST 11, T Bili 2.4, Beta-Hydroxybutyric Acid >8.  Nutrition-Focused  physical exam completed. Findings are severe fat depletion, severe muscle depletion, and no edema.   Patient meets criteria for severe chronic malnutrition in setting of severe fat depletion, severe muscle depletion. Patient has also lost 22% body weight, but unsure of exact time period weight loss has occurred over. Malnutrition likely in setting of chronic illness (noncompliance with insulin), but may also be related to social/environmental circumstances.  Diet Order:  Diet NPO time specified  Skin:  Reviewed, no issues  Last BM:  Unknown  Height:   Ht Readings from Last 1 Encounters:  05/21/16 5\' 11"  (1.803 m)    Weight:   Wt Readings from Last 1 Encounters:  05/21/16 102 lb 8.2 oz (46.5 kg)    Ideal Body Weight:  78.2 kg  BMI:  Body mass index is 14.3 kg/m.  Estimated Nutritional Needs:   Kcal:  1638-4665 (MSJ x 1.2-1.4)  Protein:  70-80 grams (1.5-1.7 grams/kg)  Fluid:  >/= 1.4-1.6 L/day (30-35 ml/kg)  EDUCATION NEEDS:   Education needs no appropriate at this time  Willey Blade, MS, RD, LDN Pager: [phone] After Hours Pager: [phone]

## 2016-05-21 NOTE — ED Notes (Signed)
Resumed care from [place], [place]. Pt resting comfortably; wakes to answer questions. NAD noted. Awaiting arrival of insulin drip from pharmacy.

## 2016-05-21 NOTE — ED Notes (Signed)
Report to Mary, RN

## 2016-05-22 DIAGNOSIS — E440 Moderate protein-calorie malnutrition: Secondary | ICD-10-CM | POA: Insufficient documentation

## 2016-05-22 LAB — GLUCOSE, CAPILLARY
GLUCOSE-CAPILLARY: 113 mg/dL — AB (ref 65–99)
GLUCOSE-CAPILLARY: 187 mg/dL — AB (ref 65–99)
GLUCOSE-CAPILLARY: 205 mg/dL — AB (ref 65–99)
GLUCOSE-CAPILLARY: 228 mg/dL — AB (ref 65–99)
GLUCOSE-CAPILLARY: 69 mg/dL (ref 65–99)
GLUCOSE-CAPILLARY: 96 mg/dL (ref 65–99)
GLUCOSE-CAPILLARY: 99 mg/dL (ref 65–99)
Glucose-Capillary: 220 mg/dL — ABNORMAL HIGH (ref 65–99)
Glucose-Capillary: 125 mg/dL — ABNORMAL HIGH (ref 65–99)
Glucose-Capillary: 148 mg/dL — ABNORMAL HIGH (ref 65–99)
Glucose-Capillary: 181 mg/dL — ABNORMAL HIGH (ref 65–99)
Glucose-Capillary: 108 mg/dL — ABNORMAL HIGH (ref 65–99)
Glucose-Capillary: 108 mg/dL — ABNORMAL HIGH (ref 65–99)
Glucose-Capillary: 123 mg/dL — ABNORMAL HIGH (ref 65–99)

## 2016-05-22 LAB — BASIC METABOLIC PANEL
ANION GAP: 4 — AB (ref 5–15)
Anion gap: 7 (ref 5–15)
BUN: 27 mg/dL — ABNORMAL HIGH (ref 6–20)
BUN: 19 mg/dL (ref 6–20)
CALCIUM: 9 mg/dL (ref 8.9–10.3)
CO2: 21 mmol/L — ABNORMAL LOW (ref 22–32)
CO2: 22 mmol/L (ref 22–32)
Calcium: 8.5 mg/dL — ABNORMAL LOW (ref 8.9–10.3)
Chloride: 129 mmol/L — ABNORMAL HIGH (ref 101–111)
Chloride: 118 mmol/L — ABNORMAL HIGH (ref 101–111)
Creatinine, Ser: 0.94 mg/dL (ref 0.61–1.24)
Creatinine, Ser: 0.72 mg/dL (ref 0.61–1.24)
GFR calc Af Amer: >60 mL/min (ref >60)
GFR calc Af Amer: >60 mL/min (ref >60)
GLUCOSE: 150 mg/dL — AB (ref 65–99)
Glucose, Bld: 72 mg/dL (ref 65–99)
POTASSIUM: 3.2 mmol/L — AB (ref 3.5–5.1)
POTASSIUM: 3.3 mmol/L — AB (ref 3.5–5.1)
SODIUM: 144 mmol/L (ref 135–145)
Sodium: 157 mmol/L — ABNORMAL HIGH (ref 135–145)

## 2016-05-22 MED ORDER — INSULIN ASPART 100 UNIT/ML ~~LOC~~ SOLN
4.0000 [IU] | Freq: Three times a day (TID) | SUBCUTANEOUS | Status: DC
Start: 2016-05-22 — End: 2016-05-23
  Administered 2016-05-22 (×2): 4 [IU] via SUBCUTANEOUS
  Filled 2016-05-22 (×2): qty 4

## 2016-05-22 MED ORDER — INSULIN ASPART 100 UNIT/ML ~~LOC~~ SOLN
0.0000 [IU] | Freq: Every day | SUBCUTANEOUS | Status: DC
  Administered 2016-05-23: 2 [IU] via SUBCUTANEOUS
  Filled 2016-05-22: qty 2

## 2016-05-22 MED ORDER — INSULIN GLARGINE 100 UNIT/ML ~~LOC~~ SOLN
10.0000 [IU] | Freq: Two times a day (BID) | SUBCUTANEOUS | Status: DC
  Administered 2016-05-22 (×2): 10 [IU] via SUBCUTANEOUS
  Filled 2016-05-22 (×4): qty 0.1

## 2016-05-22 MED ORDER — SODIUM CHLORIDE 0.9 % IV SOLN
30.0000 meq | Freq: Once | INTRAVENOUS | Status: AC
  Administered 2016-05-22: 30 meq via INTRAVENOUS
  Filled 2016-05-22: qty 15

## 2016-05-22 MED ORDER — GLUCERNA SHAKE PO LIQD
237.0000 mL | Freq: Three times a day (TID) | ORAL | Status: DC
  Administered 2016-05-23: 237 mL via ORAL

## 2016-05-22 MED ORDER — INSULIN ASPART 100 UNIT/ML ~~LOC~~ SOLN
0.0000 [IU] | Freq: Three times a day (TID) | SUBCUTANEOUS | Status: DC
  Administered 2016-05-23: 5 [IU] via SUBCUTANEOUS
  Administered 2016-05-23: 3 [IU] via SUBCUTANEOUS
  Administered 2016-05-24: 5 [IU] via SUBCUTANEOUS
  Filled 2016-05-22: qty 3
  Filled 2016-05-22 (×2): qty 5

## 2016-05-22 MED ORDER — PNEUMOCOCCAL VAC POLYVALENT 25 MCG/0.5ML IJ INJ
0.5000 mL | INJECTION | INTRAMUSCULAR | Status: AC
  Administered 2016-05-23: 0.5 mL via INTRAMUSCULAR
  Filled 2016-05-22: qty 0.5

## 2016-05-22 MED ORDER — SODIUM CHLORIDE 0.45 % IV SOLN: INTRAVENOUS | Status: DC

## 2016-05-22 MED ORDER — SODIUM CHLORIDE 0.45 % IV BOLUS
1000.0000 mL | Freq: Once | INTRAVENOUS | Status: AC
  Administered 2016-05-22: 1000 mL via INTRAVENOUS

## 2016-05-22 MED ORDER — SODIUM CHLORIDE 0.45 % IV SOLN
INTRAVENOUS | Status: DC
  Administered 2016-05-22: 100 mL/h via INTRAVENOUS
  Administered 2016-05-23 – 2016-05-24 (×3): via INTRAVENOUS

## 2016-05-22 MED ORDER — INFLUENZA VAC SPLIT QUAD 0.5 ML IM SUSY
0.5000 mL | PREFILLED_SYRINGE | INTRAMUSCULAR | Status: AC
  Administered 2016-05-23: 0.5 mL via INTRAMUSCULAR
  Filled 2016-05-22: qty 0.5

## 2016-05-22 MED ORDER — POTASSIUM CHLORIDE 20 MEQ PO PACK
40.0000 meq | PACK | Freq: Once | ORAL | Status: AC
  Administered 2016-05-22: 40 meq via ORAL
  Filled 2016-05-22: qty 2

## 2016-05-22 MED ORDER — INSULIN GLARGINE 100 UNIT/ML ~~LOC~~ SOLN
10.0000 [IU] | Freq: Two times a day (BID) | SUBCUTANEOUS | Status: DC
  Filled 2016-05-22: qty 0.1

## 2016-05-22 NOTE — Care Management (Signed)
This RNCM has faxed Insulin 70/30 prescription to [facility] for this patient.

## 2016-05-22 NOTE — Progress Notes (Signed)
[place] for electrolyte management  Indication: DKA  Pharmacy consulted for electrolyte management for 21 yo male on DKA protocol. Insulin drip was stopped at 1340 this afternoon.   Plan:   Patient received an additional 30 mEq of IV KCl this evening. No additional supplementation needed at this time. Will recheck electrolytes with AM labs tomorrow.  No Known Allergies  Patient Measurements: Height: 5\' 11"  (180.3 cm) Weight: 102 lb 8.2 oz (46.5 kg) IBW/kg (Calculated) : 75.3   Vital Signs: Temp: 98 F (36.7 C) (01/09 1800) Temp Source: Oral (01/09 1800) BP: 112/81 (01/09 1700) Pulse Rate: 105 (01/09 1800) Intake/Output from previous day: 01/08 0701 - 01/09 0700 In: 4743 [I.V.:4743] Out: 975 [Urine:975] Intake/Output from this shift: No intake/output data recorded.  Labs:  Recent Labs  05/21/16 0449 05/21/16 0547  05/21/16 2155 05/22/16 0411 05/22/16 1748  WBC 11.4*  --   --   --   --   --   HGB 17.4  --   --   --   --   --   HCT 56.5*  --   --   --   --   --   PLT 343  --   --   --   --   --   CREATININE  --  1.69*  < > 1.26* 0.94 0.72  ALBUMIN  --  4.4  --   --   --   --   PROT  --  8.1  --   --   --   --   AST  --  11*  --   --   --   --   ALT  --  44  --   --   --   --   ALKPHOS  --  165*  --   --   --   --   BILITOT  --  2.4*  --   --   --   --   < > = values in this interval not displayed. Estimated Creatinine Clearance: 95.3 mL/min (by C-G formula based on SCr of 0.72 mg/dL).   Microbiology: Recent Results (from the past 720 hour(s))  MRSA PCR Screening     Status: None   Collection Time: 05/21/16  8:20 AM  Result Value Ref Range Status   MRSA by PCR NEGATIVE NEGATIVE Final    Comment:        The GeneXpert MRSA Assay (FDA approved for NASAL specimens only), is one component of a comprehensive MRSA colonization surveillance program. It is not intended to diagnose MRSA infection nor to guide  or monitor treatment for MRSA infections.    Pharmacy will continue to monitor and adjust per consult.   Lenis Noon, PharmD, BCPS Clinical Pharmacist 05/22/2016,7:29 PM

## 2016-05-22 NOTE — Progress Notes (Addendum)
Inpatient Diabetes Program Recommendations  AACE/ADA: New Consensus Statement on Inpatient Glycemic Control (2015)  Target Ranges:  Prepandial:   less than 140 mg/dL      Peak postprandial:   less than 180 mg/dL (1-2 hours)      Critically ill patients:  140 - 180 mg/dL   Results for Jonathan Mcmillan, Jonathan Mcmillan (MRN [id number]) as of 05/22/2016 14:24  Ref. Range 05/22/2016 01:27 05/22/2016 02:46 05/22/2016 03:45 05/22/2016 04:52 05/22/2016 05:57 05/22/2016 06:34 05/22/2016 08:41 05/22/2016 09:52 05/22/2016 11:04 05/22/2016 12:09 05/22/2016 13:32  Glucose-Capillary Latest Ref Range: 65 - 99 mg/dL 220 (H) 125 (H) 148 (H) 181 (H) 108 (H) 108 (H) 99 228 (H) 123 (H) 96 113 (H)   Review of Glycemic Control  Diabetes history: DM1 Outpatient Diabetes medications: Lantus and Novolog in the past but per patient no insulin in over 2 years Current orders for Inpatient glycemic control: Lantus 10 units BID, Novolog 4 units TID with meals for meal coverage, Novolog 0-15 units TID with meals, Novolog 0-5 units QHS  Inpatient Diabetes Program Recommendations: Correction (SSI): Please consider decreasing Novolog correction to 0-9 units and change frequency of CBGs and Novolog correction to Q4H (for closer glucose monitoring given poor PO intake and to determine glucose trends).  Note: Patient has been transitioned of IV insulin to SQ insulin today. Patient received Lantus 10 units at 7:49 am today. Went by to speak with patient. Patient had head covered up and reluctantly uncovered his head when asked several times to engage in conversation. Patient reports again that he has not taken any insulin in over 2 years. Patient stated that he does not want to take insulin and wanted to take Metformin for his DM. Explained to patient that he will need insulin to live because his body is not able to produce adequate amount of insulin for DM control and glucose metabolism. Discussed DM1 and DM2 along with basic pathophysiology of both. Inquired about  cachetic state and patient reports that he has "lost so much weight he does not even know how much". Discussed DKA in basic terms and explained to patient that he will require insulin to maintain life. Patient finally stated that he "would take Lantus and Novolog but that is all I am going to do." Patient reports that he isn't going to go to this place and that place to get his insulin and see doctors. Patient has application at bedside for Medication Management [facility]) and [facility] which has not been filled out yet. Discussed application with patient and informed him that he would need to fill out the application to get assistance with medications and follow. Explained to patient that he would require insulin therefore he would have to follow up with a doctor to get prescriptions for the insulin and for assistance with DM control. Patient states that he does not check his glucose at all and states he does not have a glucometer or testing supplies. Will need prescription for glucometer and testing supplies at time of discharge.  Encouraged patient to check with [facility] to see if he can get a prescription for the glucometer and testing supplies filled there. If not, recommended patient get Reli-On glucometer ($9) and box of 50 test strips ($9) at [facility]. Provided handout information for Reli-On products at [facility]. Discussed glucose and A1C goals. Discussed importance of checking CBGs and maintaining good CBG control to prevent long-term and short-term complications. Explained how hyperglycemia leads to damage within blood vessels which lead to the common complications  seen with uncontrolled diabetes. Stressed to the patient the importance of improving glycemic control to prevent further complications from uncontrolled diabetes. Patient stated "I already know all this and I don't need to hear it again. I took insulin before." Stressed to patient that he will need to take insulin as prescribed and he will need to  monitor glucose so insulin can be adjusted if needed. Asked patient to fill out application for [facility] and [facility] (on bedside table).  Patient verbalized understanding of information discussed and he states that he has no further questions at this time related to diabetes.  MD may want to consider changing to 70/30 insulin for basal and meal coverage once patient is eating more consistently. 70/30 insulin would be more affordable insulin regimen and patient would take it BID. Based on current Lantus and Novolog meal coverage would recommend starting with 70/30 13 units BID (which will provide a total of 18 units for basal and 8 units for meal coverage per day). Please note if 70/30 is ordered as an inpatient, Lantus and Novolog meal coverage would need to be discontinued.  Thanks, Barnie Alderman, RN, MSN, CDE Diabetes Coordinator Inpatient Diabetes Program [phone] (Team Pager)

## 2016-05-22 NOTE — Care Management (Signed)
Paged MD to update on medications available through Medication management as patient does not have health insurance:  [facility] has Levemir and Apridra (4 vials left)---NO NOVOLOG OR LANTUS in stock. MD will review DM nurses recommendation and provide Rx for this RNCM to take to [facility] for this patient.

## 2016-05-22 NOTE — Progress Notes (Signed)
[place] at [place] NAME: Jonathan Mcmillan    MR#:  [id number]  DATE OF BIRTH:  09/28/1995  SUBJECTIVE: Patient is seen at bedside, admitted for DKA. Does have type 1 diabetes mellitus, not on insulin and not taking any insulin for the last 2 years. Patient supposed to take the insulin pump but could not afford and then stop it. Anion gap closed. But patient is tachycardic  And  has poor appetite.   CHIEF COMPLAINT:   Chief Complaint  Patient presents with  . Weakness  . Hyperglycemia    REVIEW OF SYSTEMS:   ROSN CONSTITUTIONAL: Weakness, fatigue. EYES: No blurred or double vision.  EARS, NOSE, AND THROAT: No tinnitus or ear pain.  RESPIRATORY: No cough, shortness of breath, wheezing or hemoptysis.  CARDIOVASCULAR: No chest pain, orthopnea, edema.  GASTROINTESTINAL: No nausea, vomiting, diarrhea or abdominal pain.  GENITOURINARY: No dysuria, hematuria.  ENDOCRINE: No polyuria, nocturia,  HEMATOLOGY: No anemia, easy bruising or bleeding SKIN: No rash or lesion. MUSCULOSKELETAL: No joint pain or arthritis.   NEUROLOGIC: No tingling, numbness, weakness.  PSYCHIATRY: No anxiety or depression.   DRUG ALLERGIES:  No Known Allergies  VITALS:  Blood pressure 125/84, pulse (!) 117, temperature 98.4 F (36.9 C), temperature source Oral, resp. rate 20, height 5\' 11"  (1.803 m), weight 46.5 kg (102 lb 8.2 oz), SpO2 100 %.  PHYSICAL EXAMINATION:  GENERAL:  21 y.o.-year-old patient lying in the bed with no acute distress. Tachycardic, mucous  membranes are dry.[name] EYES: Pupils equal, round, reactive to light and accommodation. No scleral icterus. Extraocular muscles intact.  HEENT: Head atraumatic, normocephalic. Oropharynx and nasopharynx clear.  NECK:  Supple, no jugular venous distention. No thyroid enlargement, no tenderness.  LUNGS: Normal breath sounds bilaterally, no wheezing, rales,rhonchi or crepitation. No use of accessory muscles  of respiration.  CARDIOVASCULAR: S1, S2 tachycardia.. No murmurs, rubs, or gallops.  ABDOMEN: Soft, nontender, nondistended. Bowel sounds present. No organomegaly or mass.  EXTREMITIES: No pedal edema, cyanosis, or clubbing.  NEUROLOGIC: Cranial nerves II through XII are intact. Muscle strength 5/5 in all extremities. Sensation intact. Gait not checked.  PSYCHIATRIC: The patient is alert and oriented x 3.  SKIN: No obvious rash, lesion, or ulcer.    LABORATORY PANEL:   CBC  Recent Labs Lab 05/21/16 0449  WBC 11.4*  HGB 17.4  HCT 56.5*  PLT 343   ------------------------------------------------------------------------------------------------------------------  Chemistries   Recent Labs Lab 05/21/16 0547  05/22/16 0411  NA 144  < > 157*  K 5.1  < > 3.2*  CL 106  < > 129*  CO2 12*  < > 21*  GLUCOSE 825*  < > 150*  BUN 28*  < > 27*  CREATININE 1.69*  < > 0.94  CALCIUM 9.5  < > 9.0  AST 11*  --   --   ALT 44  --   --   ALKPHOS 165*  --   --   BILITOT 2.4*  --   --   < > = values in this interval not displayed. ------------------------------------------------------------------------------------------------------------------  Cardiac Enzymes  Recent Labs Lab 05/21/16 0547  TROPONINI <0.03   ------------------------------------------------------------------------------------------------------------------  RADIOLOGY:  Ct Head Wo Contrast  Result Date: 05/21/2016 CLINICAL DATA:  21 y/o  M; syncope. EXAM: CT HEAD WITHOUT CONTRAST TECHNIQUE: Contiguous axial images were obtained from the base of the skull through the vertex without intravenous contrast. COMPARISON:  None. FINDINGS: Brain: No evidence of acute infarction, hemorrhage,  hydrocephalus, extra-axial collection or mass lesion/mass effect. Vascular: No hyperdense vessel or unexpected calcification. Skull: Normal. Negative for fracture or focal lesion. Sinuses/Orbits: No acute finding. Other: None. IMPRESSION: No  acute intracranial abnormality identified. Unremarkable CT of the head. Electronically Signed   By: Kristine Garbe M.D.   On: 05/21/2016 05:56   Dg Chest Port 1 View  Result Date: 05/21/2016 CLINICAL DATA:  Patient was found unresponsive this morning. EXAM: PORTABLE CHEST 1 VIEW COMPARISON:  01/26/2008 FINDINGS: Mild hyperinflation. Normal heart size and pulmonary vascularity. No focal airspace disease or consolidation in the lungs. No blunting of costophrenic angles. No pneumothorax. Mediastinal contours appear intact. IMPRESSION: No active disease. Electronically Signed   By: Lucienne Capers M.D.   On: 05/21/2016 06:11    EKG:   Orders placed or performed during the hospital encounter of 05/21/16  . EKG 12-Lead  . EKG 12-Lead    ASSESSMENT AND PLAN:   #1 DKA: Type 1 diabetes mellitus patient anion gap is closed. Of the insulin drip, start;IV fluids, continue Levemir. And sliding scale coverage.  #2. Hypernatremia and DKA: Continue IV fluids but change to half normal saline. #3 tachycardia due to dehydration given half normal saline 2 L bolus at this time, continue half normal saline at 100 cc an hour #4 hypokalemia: Patient potassium will be replaced with 20 of Meq of IV potassium.  #5 patient needs help from the case manager for his insulin, appreciated diabetes coordinator seeing the patient.  D/w RN    All the records are reviewed and case discussed with Care Management/Social Workerr. Management plans discussed with the patient, family and they are in agreement.  CODE STATUS: full  TOTAL TIME TAKING CARE OF THIS PATIENT: 35 minutes. (CCT)  POSSIBLE D/C IN3-4  DAYS, DEPENDING ON CLINICAL CONDITION.   Epifanio Lesches M.D on 05/22/2016 at 1:59 PM  Between 7am to 6pm - Pager - [phone]  After 6pm go to [email or web address] - password [email or web address] [place] [facility]  Office  [phone]  CC: Primary care physician; [facility]   Note:  This dictation was prepared with Dragon dictation along with smaller phrase technology. Any transcriptional errors that result from this process are unintentional.

## 2016-05-22 NOTE — Progress Notes (Signed)
Reported last BMP to prime MD.  CO2 is now at 21 and anion gap is at 7.  Dr. Estanislado Pandy will place orders to transition pt off insulin drip.

## 2016-05-23 ENCOUNTER — Encounter: Payer: Self-pay | Admitting: *Deleted

## 2016-05-23 LAB — GLUCOSE, CAPILLARY
Glucose-Capillary: 31 mg/dL — CL (ref 65–99)
Glucose-Capillary: 136 mg/dL — ABNORMAL HIGH (ref 65–99)
Glucose-Capillary: 202 mg/dL — ABNORMAL HIGH (ref 65–99)
Glucose-Capillary: 186 mg/dL — ABNORMAL HIGH (ref 65–99)
Glucose-Capillary: 205 mg/dL — ABNORMAL HIGH (ref 65–99)

## 2016-05-23 LAB — BASIC METABOLIC PANEL WITH GFR
Anion gap: 6 (ref 5–15)
BUN: 13 mg/dL (ref 6–20)
CO2: 21 mmol/L — ABNORMAL LOW (ref 22–32)
Calcium: 8.2 mg/dL — ABNORMAL LOW (ref 8.9–10.3)
Chloride: 111 mmol/L (ref 101–111)
Creatinine, Ser: 0.5 mg/dL — ABNORMAL LOW (ref 0.61–1.24)
GFR calc Af Amer: >60 mL/min
GFR calc non Af Amer: >60 mL/min
Glucose, Bld: 44 mg/dL — CL (ref 65–99)
Potassium: 3 mmol/L — ABNORMAL LOW (ref 3.5–5.1)
Sodium: 138 mmol/L (ref 135–145)

## 2016-05-23 MED ORDER — INSULIN ASPART PROT & ASPART (70-30 MIX) 100 UNIT/ML ~~LOC~~ SUSP
11.0000 [IU] | Freq: Two times a day (BID) | SUBCUTANEOUS | Status: DC
  Administered 2016-05-23 – 2016-05-24 (×2): 11 [IU] via SUBCUTANEOUS
  Filled 2016-05-23 (×2): qty 11

## 2016-05-23 MED ORDER — DEXTROSE 50 % IV SOLN
1.0000 | Freq: Once | INTRAVENOUS | Status: AC
  Administered 2016-05-23: 50 mL via INTRAVENOUS

## 2016-05-23 MED ORDER — POTASSIUM CHLORIDE CRYS ER 20 MEQ PO TBCR
40.0000 meq | EXTENDED_RELEASE_TABLET | Freq: Two times a day (BID) | ORAL | Status: AC
  Administered 2016-05-23 (×2): 40 meq via ORAL
  Filled 2016-05-23 (×2): qty 2

## 2016-05-23 MED ORDER — DEXTROSE 50 % IV SOLN
INTRAVENOUS | Status: AC
  Administered 2016-05-23: 50 mL via INTRAVENOUS
  Filled 2016-05-23: qty 50

## 2016-05-23 MED ORDER — SODIUM CHLORIDE 0.9 % IV SOLN
30.0000 meq | Freq: Once | INTRAVENOUS | Status: DC
  Filled 2016-05-23: qty 15

## 2016-05-23 NOTE — Progress Notes (Signed)
Patient arrived from ICU around 1410.  Assisted to the bathroom.  Patient steady.  Menu explained to the patient and discussed about good/healthy food choices

## 2016-05-23 NOTE — Progress Notes (Signed)
Patient is eating [employer] chicken that was brought in by family.  I had already discussed with him earlier about dietary changes

## 2016-05-23 NOTE — Progress Notes (Addendum)
Inpatient Diabetes Program Recommendations  AACE/ADA: New Consensus Statement on Inpatient Glycemic Control (2015)  Target Ranges:  Prepandial:   less than 140 mg/dL      Peak postprandial:   less than 180 mg/dL (1-2 hours)      Critically ill patients:  140 - 180 mg/dL   Results for JOBANY, MONTELLANO (MRN [id number]) as of 05/23/2016 07:44  Ref. Range 05/22/2016 08:41 05/22/2016 09:52 05/22/2016 11:04 05/22/2016 12:09 05/22/2016 13:32 05/22/2016 16:35 05/22/2016 21:32 05/23/2016 07:40  Glucose-Capillary Latest Ref Range: 65 - 99 mg/dL 99 228 (H) 123 (H) 96 113 (H) 69 187 (H) 31 (LL)   Review of Glycemic Control  Current orders for Inpatient glycemic control: Lantus 10 units BID, Novolog 0-15 units TID with meals, Novolog 0-5 units QHS, Novolog 4 units TID with meals  Inpatient Diabetes Program Recommendations: Insulin - Basal: Noted lab glucose of 44 mg/dl this morning and finger stick of 31 mg/dl at 7:40 am today. Please consider discontinuing Lantus and Novolog 4 units for meal coverage now and ordering 70/30 11 units BID with meals (starting with supper this evening). 70/30 11 units BID will provide a total of 15 units for basal and 7 units for meal coverage per day. Correction (SSI): Please consider decreasing Novolog correction sensitive scale (Novolog 0-9 units) TID. Insulin - Meal Coverage: If 70/30 is ordered as recommended, please discontinue Novolog 4 units TID with meals for meal coverage.  Thanks, Barnie Alderman, RN, MSN, CDE Diabetes Coordinator Inpatient Diabetes Program [phone] (Team Pager from 8am to 5pm)

## 2016-05-23 NOTE — Care Management (Addendum)
Insulin 70/30 is ready for pick up at [facility]. Patient doesn't appear to be ready for discharge. RNCM will pick insulin up for patient today. I have updated [place] of change in order to 11 U BID (instead of 13 U BID) to see if a new Rx will be needed.

## 2016-05-23 NOTE — Progress Notes (Signed)
Pt was assisted in drawing up his insulin.  He was able to give his own injection

## 2016-05-23 NOTE — Progress Notes (Signed)
[facility] [place] NAME: Jonathan Mcmillan    MR#:  [id number]  DATE OF BIRTH:  16-Jul-1995  SUBJECTIVE: Patient is seen at bedside, admitted for DKA. Does have type 1 diabetes mellitus, not on insulin and not taking any insulin for the last 2 years. Patient supposed to take the insulin pump but could not afford and then stop it. Anion gap closed. little better today. Sodium is improved.   CHIEF COMPLAINT:   Chief Complaint  Patient presents with  . Weakness  . Hyperglycemia    REVIEW OF SYSTEMS:   ROSN CONSTITUTIONAL: Weakness, fatigue. EYES: No blurred or double vision.  EARS, NOSE, AND THROAT: No tinnitus or ear pain.  RESPIRATORY: No cough, shortness of breath, wheezing or hemoptysis.  CARDIOVASCULAR: No chest pain, orthopnea, edema.  GASTROINTESTINAL: No nausea, vomiting, diarrhea or abdominal pain.  GENITOURINARY: No dysuria, hematuria.  ENDOCRINE: No polyuria, nocturia,  HEMATOLOGY: No anemia, easy bruising or bleeding SKIN: No rash or lesion. MUSCULOSKELETAL: No joint pain or arthritis.   NEUROLOGIC: No tingling, numbness, weakness.  PSYCHIATRY: No anxiety or depression.   DRUG ALLERGIES:  No Known Allergies  VITALS:  Blood pressure 112/62, pulse 92, temperature 98.3 F (36.8 C), temperature source Axillary, resp. rate 16, height 5\' 11"  (1.803 m), weight 46.5 kg (102 lb 8.2 oz), SpO2 100 %.  PHYSICAL EXAMINATION:  GENERAL:  21 y.o.-year-old patient lying in the bed with no acute distress. Tachycardic, mucous  membranes are dry.[name] EYES: Pupils equal, round, reactive to light and accommodation. No scleral icterus. Extraocular muscles intact.  HEENT: Head atraumatic, normocephalic. Oropharynx and nasopharynx clear.  NECK:  Supple, no jugular venous distention. No thyroid enlargement, no tenderness.  LUNGS: Normal breath sounds bilaterally, no wheezing, rales,rhonchi or crepitation. No use of accessory muscles of  respiration.  CARDIOVASCULAR: S1, S2 tachycardia.. No murmurs, rubs, or gallops.  ABDOMEN: Soft, nontender, nondistended. Bowel sounds present. No organomegaly or mass.  EXTREMITIES: No pedal edema, cyanosis, or clubbing.  NEUROLOGIC: Cranial nerves II through XII are intact. Muscle strength 5/5 in all extremities. Sensation intact. Gait not checked.  PSYCHIATRIC: The patient is alert and oriented x 3.  SKIN: No obvious rash, lesion, or ulcer.    LABORATORY PANEL:   CBC  Recent Labs Lab 05/21/16 0449  WBC 11.4*  HGB 17.4  HCT 56.5*  PLT 343   ------------------------------------------------------------------------------------------------------------------  Chemistries   Recent Labs Lab 05/21/16 0547  05/23/16 0608  NA 144  < > 138  K 5.1  < > 3.0*  CL 106  < > 111  CO2 12*  < > 21*  GLUCOSE 825*  < > 44*  BUN 28*  < > 13  CREATININE 1.69*  < > 0.50*  CALCIUM 9.5  < > 8.2*  AST 11*  --   --   ALT 44  --   --   ALKPHOS 165*  --   --   BILITOT 2.4*  --   --   < > = values in this interval not displayed. ------------------------------------------------------------------------------------------------------------------  Cardiac Enzymes  Recent Labs Lab 05/21/16 0547  TROPONINI <0.03   ------------------------------------------------------------------------------------------------------------------  RADIOLOGY:  No results found.  EKG:   Orders placed or performed during the hospital encounter of 05/21/16  . EKG 12-Lead  . EKG 12-Lead    ASSESSMENT AND PLAN:   #1 DKA: Type 1 diabetes mellitus patient anion gap is closed. Offthe insulin drip, start;IV fluids,  hypoGlycemia this morning ;  Received OJ,, graham crackers. Patient also received D50. Seen by diabetes coordinator. Discontinue Levemir, Humalog with   Start 70/30 twice a day starting with supper this evening. And changed insulin (novolog) correction to sensitive  scale.  #2. Hypernatremia and DKA:  Improving. #3 tachycardia due to dehydration ; finally improving. #5 patient needs help from the case manager for his insulin, appreciated diabetes coordinator seeing the patient.  D/w RN  #6. likely discharge in next 2 days  All the records are reviewed and case discussed with Care Management/Social Workerr. Management plans discussed with the patient, family and they are in agreement.  CODE STATUS: full  TOTAL TIME TAKING CARE OF THIS PATIENT: 35 minutes. (CCT)  POSSIBLE D/C IN3-4  DAYS, DEPENDING ON CLINICAL CONDITION.   Epifanio Lesches M.D on 05/23/2016 at 1:24 PM  Between 7am to 6pm - Pager - [phone]  After 6pm go to [email or web address] - password [email or web address] [facility]  Office  [phone]  CC: Primary care physician; [facility]   Note: This dictation was prepared with Dragon dictation along with smaller phrase technology. Any transcriptional errors that result from this process are unintentional.

## 2016-05-23 NOTE — Progress Notes (Signed)
Pt being transferred to room 228. Report called to [place], Therapist, sports. Pt and belongings moved to room 228 without incident.

## 2016-05-23 NOTE — Progress Notes (Signed)
Pt's CBG this morning 31 mg/dL. Pt alert and oriented, given apple juice and graham crackers. Dr. Vianne Bulls notified, stated to give 1 amp D50.

## 2016-05-23 NOTE — Progress Notes (Signed)
[facility] responded to a referral from RN to [facility]. Pt was eating lunch. RN felt Pt could use some company as he is young. [place] used this initial visit to establish a core relationship and will follow up to provided comfort and relation.     05/23/16 1400  Clinical Encounter Type  Visited With Patient;Health care provider  Visit Type Initial;Spiritual support  Referral From Nurse  Spiritual Encounters  Spiritual Needs Emotional

## 2016-05-23 NOTE — Progress Notes (Signed)
[place] for electrolyte management  Indication: DKA  Pharmacy consulted for electrolyte management for 21 yo male on DKA protocol. Insulin drip was stopped at 1340 this afternoon.   Plan:   K=3.0; will supplement with KCL 28mEq x2. Will recheck electrolytes with AM labs tomorrow.  No Known Allergies  Patient Measurements: Height: 5\' 11"  (180.3 cm) Weight: 102 lb 8.2 oz (46.5 kg) IBW/kg (Calculated) : 75.3   Vital Signs: Temp: 97.6 F (36.4 C) (01/10 0800) Temp Source: Axillary (01/10 0800) BP: 137/92 (01/10 0900) Pulse Rate: 94 (01/10 0900) Intake/Output from previous day: 01/09 0701 - 01/10 0700 In: 4667.2 [P.O.:240; I.V.:2427.2; IV Piggyback:2000] Out: 1650 [Urine:1650] Intake/Output from this shift: Total I/O In: 500 [I.V.:500] Out: 800 [Urine:800]  Labs:  Recent Labs  05/21/16 0449 05/21/16 0547  05/22/16 0411 05/22/16 1748 05/23/16 0608  WBC 11.4*  --   --   --   --   --   HGB 17.4  --   --   --   --   --   HCT 56.5*  --   --   --   --   --   PLT 343  --   --   --   --   --   CREATININE  --  1.69*  < > 0.94 0.72 0.50*  ALBUMIN  --  4.4  --   --   --   --   PROT  --  8.1  --   --   --   --   AST  --  11*  --   --   --   --   ALT  --  44  --   --   --   --   ALKPHOS  --  165*  --   --   --   --   BILITOT  --  2.4*  --   --   --   --   < > = values in this interval not displayed. Estimated Creatinine Clearance: 95.3 mL/min (by C-G formula based on SCr of 0.5 mg/dL (L)).   Microbiology: Recent Results (from the past 720 hour(s))  MRSA PCR Screening     Status: None   Collection Time: 05/21/16  8:20 AM  Result Value Ref Range Status   MRSA by PCR NEGATIVE NEGATIVE Final    Comment:        The GeneXpert MRSA Assay (FDA approved for NASAL specimens only), is one component of a comprehensive MRSA colonization surveillance program. It is not intended to diagnose MRSA infection nor to guide or monitor  treatment for MRSA infections.    Pharmacy will continue to monitor and adjust per consult.   Loree Fee, PharmD 05/23/2016,10:49 AM

## 2016-05-24 LAB — GLUCOSE, CAPILLARY
GLUCOSE-CAPILLARY: 261 mg/dL — AB (ref 65–99)
Glucose-Capillary: 245 mg/dL — ABNORMAL HIGH (ref 65–99)

## 2016-05-24 LAB — CBC
HCT: 30.9 % — ABNORMAL LOW (ref 40.0–52.0)
Hemoglobin: 10.6 g/dL — ABNORMAL LOW (ref 13.0–18.0)
MCH: 30.7 pg (ref 26.0–34.0)
MCHC: 34.2 g/dL (ref 32.0–36.0)
MCV: 90 fL (ref 80.0–100.0)
PLATELETS: 156 10*3/uL (ref 150–440)
RBC: 3.44 MIL/uL — ABNORMAL LOW (ref 4.40–5.90)
RDW: 12.4 % (ref 11.5–14.5)
WBC: 9.7 10*3/uL (ref 3.8–10.6)

## 2016-05-24 LAB — BASIC METABOLIC PANEL
Anion gap: 4 — ABNORMAL LOW (ref 5–15)
BUN: 10 mg/dL (ref 6–20)
CALCIUM: 8 mg/dL — AB (ref 8.9–10.3)
CO2: 25 mmol/L (ref 22–32)
CREATININE: 0.39 mg/dL — AB (ref 0.61–1.24)
Chloride: 108 mmol/L (ref 101–111)
GFR calc Af Amer: >60 mL/min (ref >60)
GLUCOSE: 247 mg/dL — AB (ref 65–99)
POTASSIUM: 3.7 mmol/L (ref 3.5–5.1)
SODIUM: 137 mmol/L (ref 135–145)

## 2016-05-24 MED ORDER — INSULIN ASPART PROT & ASPART (70-30 MIX) 100 UNIT/ML ~~LOC~~ SUSP: 11.0000 [IU] | Freq: Two times a day (BID) | SUBCUTANEOUS | 11 refills | Status: DC

## 2016-05-24 MED ORDER — INSULIN ASPART 100 UNIT/ML ~~LOC~~ SOLN: 0.0000 [IU] | Freq: Three times a day (TID) | SUBCUTANEOUS | 11 refills | Status: DC

## 2016-05-24 MED ORDER — INSULIN ASPART 100 UNIT/ML ~~LOC~~ SOLN
0.0000 [IU] | Freq: Three times a day (TID) | SUBCUTANEOUS | Status: DC
  Administered 2016-05-24: 8 [IU] via SUBCUTANEOUS
  Filled 2016-05-24: qty 8

## 2016-05-24 MED ORDER — GLUCERNA SHAKE PO LIQD: 237.0000 mL | Freq: Three times a day (TID) | ORAL | 0 refills | Status: DC

## 2016-05-24 MED ORDER — INSULIN ASPART 100 UNIT/ML ~~LOC~~ SOLN: 0.0000 [IU] | Freq: Every day | SUBCUTANEOUS | 11 refills | Status: DC

## 2016-05-24 MED ORDER — INSULIN ASPART PROT & ASPART (70-30 MIX) 100 UNIT/ML ~~LOC~~ SUSP: 13.0000 [IU] | Freq: Two times a day (BID) | SUBCUTANEOUS | 11 refills | Status: DC

## 2016-05-24 NOTE — Progress Notes (Signed)
Patient discharged to home as ordered. Patient seen by dietician and diabetic educator, patient given glucometer, and strips as ordered as well as 70/30 insulin, Patient verbalizes understanding of discharge orders and follow up appointments. Patient denies pain and ambulates well without assistance. IV discontinued site clean dry and intact.

## 2016-05-24 NOTE — Progress Notes (Signed)
  RD consulted for nutrition education regarding diabetes.   Lab Results  Component Value Date   HGBA1C 12.9 (H) 08/05/2013    RD provided "Carbohydrate Counting for People with Diabetes" handout from the Academy of Nutrition and Dietetics. Discussed different food groups and their effects on blood sugar, emphasizing carbohydrate-containing foods. Provided list of carbohydrates and recommended serving sizes of common foods.   Discussed importance of controlled and consistent carbohydrate intake throughout the day. Provided examples of ways to balance meals/snacks and encouraged intake of high-fiber, whole grain complex carbohydrates. Teach back method used.  Expect good compliance.  Pt to be discharged today  [facility] [place], [place], LDN [phone] Pager  [phone] Weekend/On-Call Pager

## 2016-05-24 NOTE — Discharge Instructions (Signed)
Diabetes Mellitus and Food It is important for you to manage your blood sugar (glucose) level. Your blood glucose level can be greatly affected by what you eat. Eating healthier foods in the appropriate amounts throughout the day at about the same time each day will help you control your blood glucose level. It can also help slow or prevent worsening of your diabetes mellitus. Healthy eating may even help you improve the level of your blood pressure and reach or maintain a healthy weight. General recommendations for healthful eating and cooking habits include:  Eating meals and snacks regularly. Avoid going long periods of time without eating to lose weight.  Eating a diet that consists mainly of plant-based foods, such as fruits, vegetables, nuts, legumes, and whole grains.  Using low-heat cooking methods, such as baking, instead of high-heat cooking methods, such as deep frying.  Work with your dietitian to make sure you understand how to use the Nutrition Facts information on food labels. How can food affect me? Carbohydrates Carbohydrates affect your blood glucose level more than any other type of food. Your dietitian will help you determine how many carbohydrates to eat at each meal and teach you how to count carbohydrates. Counting carbohydrates is important to keep your blood glucose at a healthy level, especially if you are using insulin or taking certain medicines for diabetes mellitus. Alcohol Alcohol can cause sudden decreases in blood glucose (hypoglycemia), especially if you use insulin or take certain medicines for diabetes mellitus. Hypoglycemia can be a life-threatening condition. Symptoms of hypoglycemia (sleepiness, dizziness, and disorientation) are similar to symptoms of having too much alcohol. If your health care provider has given you approval to drink alcohol, do so in moderation and use the following guidelines:  Women should not have more than one drink per day, and men  should not have more than two drinks per day. One drink is equal to: ? 12 oz of beer. ? 5 oz of wine. ? 1 oz of hard liquor.  Do not drink on an empty stomach.  Keep yourself hydrated. Have water, diet soda, or unsweetened iced tea.  Regular soda, juice, and other mixers might contain a lot of carbohydrates and should be counted.  What foods are not recommended? As you make food choices, it is important to remember that all foods are not the same. Some foods have fewer nutrients per serving than other foods, even though they might have the same number of calories or carbohydrates. It is difficult to get your body what it needs when you eat foods with fewer nutrients. Examples of foods that you should avoid that are high in calories and carbohydrates but low in nutrients include:  Trans fats (most processed foods list trans fats on the Nutrition Facts label).  Regular soda.  Juice.  Candy.  Sweets, such as cake, pie, doughnuts, and cookies.  Fried foods.  What foods can I eat? Eat nutrient-rich foods, which will nourish your body and keep you healthy. The food you should eat also will depend on several factors, including:  The calories you need.  The medicines you take.  Your weight.  Your blood glucose level.  Your blood pressure level.  Your cholesterol level.  You should eat a variety of foods, including:  Protein. ? Lean cuts of meat. ? Proteins low in saturated fats, such as fish, egg whites, and beans. Avoid processed meats.  Fruits and vegetables. ? Fruits and vegetables that may help control blood glucose levels, such as apples,   mangoes, and yams.  Dairy products. ? Choose fat-free or low-fat dairy products, such as milk, yogurt, and cheese.  Grains, bread, pasta, and rice. ? Choose whole grain products, such as multigrain bread, whole oats, and brown rice. These foods may help control blood pressure.  Fats. ? Foods containing healthful fats, such as  nuts, avocado, olive oil, canola oil, and fish.  Does everyone with diabetes mellitus have the same meal plan? Because every person with diabetes mellitus is different, there is not one meal plan that works for everyone. It is very important that you meet with a dietitian who will help you create a meal plan that is just right for you. This information is not intended to replace advice given to you by your health care provider. Make sure you discuss any questions you have with your health care provider. Document Released: 01/25/2005 Document Revised: 10/06/2015 Document Reviewed: 03/27/2013 Elsevier Interactive Patient Education  2017 Elsevier Inc.  

## 2016-05-24 NOTE — Discharge Summary (Signed)
Jonathan Mcmillan, is a 21 y.o. male  DOB Sep 10, 1995  MRN [id number].  Admission date:  05/21/2016  Admitting Physician  Saundra Shelling, MD  Discharge Date:  05/24/2016   Primary MD  [place]  Recommendations for primary care physician for things to follow:      Admission Diagnosis  Marijuana abuse [F12.10] Hyperglycemia [R73.9] Generalized weakness [R53.1] Diabetic ketoacidosis without coma associated with type 1 diabetes mellitus ([place]) [E10.10] Syncope, unspecified syncope type [R55]   Discharge Diagnosis  Marijuana abuse [F12.10] Hyperglycemia [R73.9] Generalized weakness [R53.1] Diabetic ketoacidosis without coma associated with type 1 diabetes mellitus ([place]) [E10.10] Syncope, unspecified syncope type [R55]    Active Problems:   DKA (diabetic ketoacidoses) (HCC)   Malnutrition of moderate degree      Past Medical History:  Diagnosis Date  . Diabetes 1.5, managed as type 1 [facility])     Past Surgical History:  Procedure Laterality Date  . none         History of present illness and  Hospital Course:     Kindly see H&P for history of present illness and admission details, please review complete Labs, Consult reports and Test reports for all details in brief  HPI  from the history and physical done on the day of admission Jonathan Mcmillan  is a 21 y.o. male with a known history of Type 1 diabetes mellitus presented to the emergency room with generalized weakness. He also has some nausea and felt weak and about to pass out. Patient has been in and out of jail and not been on his diabetic medication. Workup in the emergency room showed elevated blood sugars and he was severely acidotic. Patient was given 2 L of IV fluid bolus in the emergency room and started on insulin drip. . No abdominal pain. His  ketones were positive in the urine and his blood sugar was greater than 800 mg/dL.   Hospital Course  21 yr old  Male with [id number] to ICU.started on insulin drip,aggreesive hydration,the patient has severe metabolic  acidosis ,acute renal failure  : Patient required ICU admission, monitoring in ICU for 3 days. Patient came off insulin drip, seen by diabetes coordinator, started on insulin 70/ 30  11 units twice a day, NovoLog correction scale. Initially kept nothing by mouth, monitor Chem-7 every 4 hours, started on IV nausea medicines, IV PPIs. He is  not taking insulin for the last 2 years. Also non-complaint here, drinks a lot of sodas, [place]. The patient that he would require insulin and he would have to follow-up with primary doctor for insulin-dependent assistance. Diabetes control. Patient is encouraged to check at medication management clinic to see if she can get prescription for glucometer and testing supplies. ,  Discontinue Lantus and NovoLog at discharge. Started on lab 70/30 insulin 13 units twice a day as per  diabetes coordinator recommendation, 8 units education about label  checking, carb Counting.  Present medication include NovoLog correction scale 0-9 units 3 times a day with meals, insulin 70/30 13 units twice a day.   Discharge Condition: stable   Follow UP   (medication management clinic.   Discharge Instructions  and  Discharge Medications      Allergies as of 05/24/2016   No Known Allergies     Medication List    STOP taking these medications   GLUCAGON EMERGENCY 1 MG injection Generic drug:  glucagon   insulin glargine 100 UNIT/ML injection Commonly known as:  LANTUS  NOVOLOG FLEXPEN [place] Replaced by:  insulin aspart 100 UNIT/ML injection     TAKE these medications   feeding supplement (GLUCERNA SHAKE) Liqd Take 237 mLs by mouth 3 (three) times daily between meals.   insulin aspart 100 UNIT/ML injection Commonly known as:   novoLOG Inject 0-5 Units into the skin at bedtime. Replaces:  NOVOLOG FLEXPEN [place]   insulin aspart 100 UNIT/ML injection Commonly known as:  novoLOG Inject 0-9 Units into the skin 3 (three) times daily with meals.   insulin aspart protamine- aspart (70-30) 100 UNIT/ML injection Commonly known as:  NOVOLOG MIX 70/30 Inject 0.13 mLs (13 Units total) into the skin 2 (two) times daily before a meal.         Diet and Activity recommendation: See Discharge Instructions above   Consults obtained -diabetes coordinator   Major procedures and Radiology Reports - PLEASE review detailed and final reports for all details, in brief -     Ct Head Wo Contrast  Result Date: 05/21/2016 CLINICAL DATA:  21 y/o  M; syncope. EXAM: CT HEAD WITHOUT CONTRAST TECHNIQUE: Contiguous axial images were obtained from the base of the skull through the vertex without intravenous contrast. COMPARISON:  None. FINDINGS: Brain: No evidence of acute infarction, hemorrhage, hydrocephalus, extra-axial collection or mass lesion/mass effect. Vascular: No hyperdense vessel or unexpected calcification. Skull: Normal. Negative for fracture or focal lesion. Sinuses/Orbits: No acute finding. Other: None. IMPRESSION: No acute intracranial abnormality identified. Unremarkable CT of the head. Electronically Signed   By: Kristine Garbe M.D.   On: 05/21/2016 05:56   Dg Chest Port 1 View  Result Date: 05/21/2016 CLINICAL DATA:  Patient was found unresponsive this morning. EXAM: PORTABLE CHEST 1 VIEW COMPARISON:  01/26/2008 FINDINGS: Mild hyperinflation. Normal heart size and pulmonary vascularity. No focal airspace disease or consolidation in the lungs. No blunting of costophrenic angles. No pneumothorax. Mediastinal contours appear intact. IMPRESSION: No active disease. Electronically Signed   By: Lucienne Capers M.D.   On: 05/21/2016 06:11    Micro Results    Recent Results (from the past 240 hour(s))  MRSA PCR  Screening     Status: None   Collection Time: 05/21/16  8:20 AM  Result Value Ref Range Status   MRSA by PCR NEGATIVE NEGATIVE Final    Comment:        The GeneXpert MRSA Assay (FDA approved for NASAL specimens only), is one component of a comprehensive MRSA colonization surveillance program. It is not intended to diagnose MRSA infection nor to guide or monitor treatment for MRSA infections.        Today   Subjective:   Jonathan Mcmillan today has no headache,no chest abdominal pain,no new weakness tingling or numbness, feels much better wants to go home today.   Objective:   Blood pressure 116/69, pulse 88, temperature 98.3 F (36.8 C), temperature source Oral, resp. rate 16, height 5\' 11"  (1.803 m), weight 46.5 kg (102 lb 8.2 oz), SpO2 100 %.   Intake/Output Summary (Last 24 hours) at 05/24/16 0945 Last data filed at 05/24/16 0657  Gross per 24 hour  Intake             3448 ml  Output             2000 ml  Net             1448 ml    Exam Awake Alert, Oriented x 3, No new F.N deficits, Normal affect Jonathan Mcmillan.AT,PERRAL Supple Neck,No JVD, No cervical  lymphadenopathy appriciated.  Symmetrical Chest wall movement, Good air movement bilaterally, CTAB RRR,No Gallops,Rubs or new Murmurs, No Parasternal Heave +ve B.Sounds, Abd Soft, Non tender, No organomegaly appriciated, No rebound -guarding or rigidity. No Cyanosis, Clubbing or edema, No new Rash or bruise  Data Review   CBC w Diff: Lab Results  Component Value Date   WBC 9.7 05/24/2016   HGB 10.6 (L) 05/24/2016   HGB 11.5 (L) 08/07/2013   HCT 30.9 (L) 05/24/2016   HCT 34.0 (L) 08/07/2013   PLT 156 05/24/2016   PLT 321 08/07/2013   LYMPHOPCT 12 05/21/2016   LYMPHOPCT 15.2 08/07/2013   MONOPCT 6 05/21/2016   MONOPCT 6.7 08/07/2013   EOSPCT 0 05/21/2016   EOSPCT 2.3 08/07/2013   BASOPCT 1 05/21/2016   BASOPCT 0.8 08/07/2013    CMP: Lab Results  Component Value Date   NA 137 05/24/2016   NA 133 (L) 08/05/2013    K 3.7 05/24/2016   K 3.8 08/05/2013   CL 108 05/24/2016   CL 99 08/05/2013   CO2 25 05/24/2016   CO2 28 08/05/2013   BUN 10 05/24/2016   BUN 5 (L) 08/05/2013   CREATININE 0.39 (L) 05/24/2016   CREATININE 0.62 08/05/2013   PROT 8.1 05/21/2016   PROT 7.8 08/05/2013   ALBUMIN 4.4 05/21/2016   ALBUMIN 2.9 (L) 08/05/2013   BILITOT 2.4 (H) 05/21/2016   BILITOT 1.4 (H) 08/05/2013   ALKPHOS 165 (H) 05/21/2016   ALKPHOS 173 (H) 08/05/2013   AST 11 (L) 05/21/2016   AST 10 08/05/2013   ALT 44 05/21/2016   ALT 10 (L) 08/05/2013  .   Total Time in preparing paper work, data evaluation and todays exam - 36 minutes  Jonathan Mcmillan M.D on 05/24/2016 at 9:45 AM    Note: This dictation was prepared with Dragon dictation along with smaller phrase technology. Any transcriptional errors that result from this process are unintentional.

## 2016-05-24 NOTE — Progress Notes (Signed)
Inpatient Diabetes Program Recommendations  AACE/ADA: New Consensus Statement on Inpatient Glycemic Control (2015)  Target Ranges:  Prepandial:   less than 140 mg/dL      Peak postprandial:   less than 180 mg/dL (1-2 hours)      Critically ill patients:  140 - 180 mg/dL  Results for ANSEL, FERRALL (MRN [id number]) as of 05/24/2016 07:44  Ref. Range 05/24/2016 05:15  Glucose Latest Ref Range: 65 - 99 mg/dL 247 (H)   Results for ZADE, FALKNER (MRN [id number]) as of 05/24/2016 07:44  Ref. Range 05/23/2016 08:45 05/23/2016 11:57 05/23/2016 17:22 05/23/2016 21:10  Glucose-Capillary Latest Ref Range: 65 - 99 mg/dL 136 (H) 202 (H) 186 (H) 205 (H)   Review of Glycemic Control  Current orders for Inpatient glycemic control: 70/30 11 units BID, Novolog 0-15 units TID with meals, Novolog 0-5 units QHS  Inpatient Diabetes Program Recommendations:  Insulin - Basal: Please consider increasing 70/30 to 13 units BID (which will provide a total of 18 units for basal and 8 units for meal coverage per day). Correction (SSI): Please consider decreasing Novolog correction to 0-9 units TID with meals.  Thanks, Barnie Alderman, RN, MSN, CDE Diabetes Coordinator Inpatient Diabetes Program [phone] (Team Pager from 8am to 5pm)

## 2016-05-24 NOTE — Progress Notes (Signed)
Inpatient Diabetes Program Recommendations  AACE/ADA: New Consensus Statement on Inpatient Glycemic Control (2015)  Target Ranges:  Prepandial:   less than 140 mg/dL      Peak postprandial:   less than 180 mg/dL (1-2 hours)      Critically ill patients:  140 - 180 mg/dL   Lab Results  Component Value Date   GLUCAP 261 (H) 05/24/2016   HGBA1C 12.9 (H) 08/05/2013    For discharge: Please consider ordering Novolin 70/30 13 units bid and Apidra 0-9units tid with meals  (please include the entire sliding scale written out on the discharge orders so patient knows the amount of insulin to give).    We are requesting Novolin 70/30 and Apidra because this is what is available at the Medication Management clinic and prescriptions must be written for exactly for what will be dispensed.   Discussed with Isaias Cowman, Nurse Case Manager  Gentry Fitz, RN, BA, MHA, CDE Diabetes Coordinator Inpatient Diabetes Program  [phone] (Team Pager) [phone] ([place]) 05/24/2016 12:11 PM

## 2016-10-24 ENCOUNTER — Encounter: Payer: Self-pay | Admitting: Emergency Medicine

## 2016-10-24 ENCOUNTER — Emergency Department
Admission: EM | Admit: 2016-10-24 | Discharge: 2016-10-24 | Disposition: A | Discharge status: Home / Self Care | Payer: Self-pay | Attending: Emergency Medicine | Admitting: Emergency Medicine

## 2016-10-24 DIAGNOSIS — F17200 Nicotine dependence, unspecified, uncomplicated: Secondary | ICD-10-CM | POA: Insufficient documentation

## 2016-10-24 DIAGNOSIS — L02811 Cutaneous abscess of head [any part, except face]: Secondary | ICD-10-CM | POA: Insufficient documentation

## 2016-10-24 DIAGNOSIS — I10 Essential (primary) hypertension: Secondary | ICD-10-CM | POA: Insufficient documentation

## 2016-10-24 DIAGNOSIS — E1010 Type 1 diabetes mellitus with ketoacidosis without coma: Secondary | ICD-10-CM | POA: Insufficient documentation

## 2016-10-24 DIAGNOSIS — Z794 Long term (current) use of insulin: Secondary | ICD-10-CM | POA: Insufficient documentation

## 2016-10-24 DIAGNOSIS — L0291 Cutaneous abscess, unspecified: Secondary | ICD-10-CM

## 2016-10-24 MED ORDER — SULFAMETHOXAZOLE-TRIMETHOPRIM 800-160 MG PO TABS: 1.0000 | ORAL_TABLET | Freq: Two times a day (BID) | ORAL | 0 refills | Status: AC

## 2016-10-24 MED ORDER — SULFAMETHOXAZOLE-TRIMETHOPRIM 800-160 MG PO TABS
1.0000 | ORAL_TABLET | Freq: Once | ORAL | Status: AC
  Administered 2016-10-24: 1 via ORAL
  Filled 2016-10-24: qty 1

## 2016-10-24 NOTE — Discharge Instructions (Signed)
Please take your antibiotic as prescribed for its entire course. Please follow-up with your primary care doctor in 2-3 days for recheck/reevaluation. Return to the emergency department for any fever, vomiting, or any other symptom personally concerning to yourself.

## 2016-10-24 NOTE — ED Triage Notes (Signed)
Patient ambulatory to triage with steady gait, without difficulty or distress noted; pt reports cyst to back of head noted since Sunday; st hx of same

## 2016-10-24 NOTE — ED Provider Notes (Signed)
[facility] Emergency Department Provider Note  Time seen: 7:14 AM  I have reviewed the triage vital signs and the nursing notes.   HISTORY  Chief Complaint Abscess    HPI Jonathan Mcmillan is a 21 y.o. male with a past medical history of diabetes, who presents the emergency department for a painful area to the back of his head. According to the patient he has a history of abscesses which have required drainage in the past. He states for the past 2 days he has felt a tender swollen area to the back of his head. Denies any fever, nausea, vomiting. Overall negative review of systems.  Past Medical History:  Diagnosis Date  . Diabetes 1.5, managed as type 1 [facility])     Patient Active Problem List   Diagnosis Date Noted  . Malnutrition of moderate degree 05/22/2016  . DKA (diabetic ketoacidoses) ([place]) 05/21/2016  . Type I (juvenile type) diabetes mellitus without mention of complication, uncontrolled 10/05/2010  . Hypertension 10/05/2010  . Obesity 10/05/2010    Past Surgical History:  Procedure Laterality Date  . none      Prior to Admission medications   Medication Sig Start Date End Date Taking? Authorizing Provider  feeding supplement, GLUCERNA SHAKE, (GLUCERNA SHAKE) LIQD Take 237 mLs by mouth 3 (three) times daily between meals. 05/24/16   Epifanio Lesches, MD  insulin aspart protamine- aspart (NOVOLOG MIX 70/30) (70-30) 100 UNIT/ML injection Inject 0.13 mLs (13 Units total) into the skin 2 (two) times daily before a meal. 05/24/16   Epifanio Lesches, MD    No Known Allergies  Family History  Problem Relation Age of Onset  . Diabetes Mother     Social History Social History  Substance Use Topics  . Smoking status: Current Every Day Smoker  . Smokeless tobacco: Never Used  . Alcohol use Yes    Review of Systems Constitutional: Negative for fever. ENT: Negative for congestion Cardiovascular: Negative for chest pain. Respiratory:  Negative for shortness of breath. Gastrointestinal: Negative for abdominal pain, vomiting. Intermittent loose stool times several days. Genitourinary: Negative for dysuria. Musculoskeletal: Negative for back pain. Skin: Tender area to the back of the head Neurological: Negative for headache All other ROS negative  ____________________________________________   PHYSICAL EXAM:  VITAL SIGNS: ED Triage Vitals  Enc Vitals Group     BP 10/24/16 0624 (!) 156/101     Pulse Rate 10/24/16 0624 (!) 117     Resp 10/24/16 0624 18     Temp 10/24/16 0624 98.5 F (36.9 C)     Temp Source 10/24/16 0624 Oral     SpO2 10/24/16 0624 99 %     Weight 10/24/16 0619 140 lb (63.5 kg)     Height 10/24/16 0619 5\' 9"  (1.753 m)     Head Circumference --      Peak Flow --      Pain Score 10/24/16 0619 10     Pain Loc --      Pain Edu? --      Excl. in [place]? --     Constitutional: Alert and oriented. Well appearing and in no distress. Eyes: Normal exam ENT   Head: Atraumatic. Tender area approximately 2 x 2 centimeters to the occipital scalp, no erythema, mild induration.   Mouth/Throat: Mucous membranes are moist. Cardiovascular: Normal rate, regular rhythm. No murmur Respiratory: Normal respiratory effort without tachypnea nor retractions. Breath sounds are clear  Gastrointestinal: Soft and nontender. No distention.   Musculoskeletal:  Nontender with normal range of motion in all extremities. Neurologic:  Normal speech and language. No gross focal neurologic deficits Skin:  Skin is warm, dry. Tender area to occipital scalp with mild induration. No fluctuance. No erythema. Psychiatric: Mood and affect are normal.   ____________________________________________    INITIAL IMPRESSION / ASSESSMENT AND PLAN / ED COURSE  Pertinent labs & imaging results that were available during my care of the patient were reviewed by me and considered in my medical decision making (see chart for  details).  Patient with tender area to the back of the occipital scalp. Likely early abscess, at this time there does not appear to be any drainable pocket. No fluctuance or pointing. No fever or nausea or vomiting. We will discharge with Bactrim. I discussed return precautions with the patient. Patient agreeable to plan.  ____________________________________________   FINAL CLINICAL IMPRESSION(S) / ED DIAGNOSES  Early abscess    Harvest Dark, MD 10/24/16 [phone]

## 2016-11-23 ENCOUNTER — Encounter: Payer: Self-pay | Admitting: Emergency Medicine

## 2016-11-23 ENCOUNTER — Emergency Department
Admission: EM | Admit: 2016-11-23 | Discharge: 2016-11-23 | Disposition: A | Discharge status: Home / Self Care | Payer: Self-pay | Attending: Emergency Medicine | Admitting: Emergency Medicine

## 2016-11-23 ENCOUNTER — Emergency Department: Payer: Self-pay

## 2016-11-23 DIAGNOSIS — F17200 Nicotine dependence, unspecified, uncomplicated: Secondary | ICD-10-CM | POA: Insufficient documentation

## 2016-11-23 DIAGNOSIS — R739 Hyperglycemia, unspecified: Secondary | ICD-10-CM

## 2016-11-23 DIAGNOSIS — E1065 Type 1 diabetes mellitus with hyperglycemia: Secondary | ICD-10-CM | POA: Insufficient documentation

## 2016-11-23 DIAGNOSIS — Z794 Long term (current) use of insulin: Secondary | ICD-10-CM | POA: Insufficient documentation

## 2016-11-23 DIAGNOSIS — L02811 Cutaneous abscess of head [any part, except face]: Secondary | ICD-10-CM | POA: Insufficient documentation

## 2016-11-23 DIAGNOSIS — L0291 Cutaneous abscess, unspecified: Secondary | ICD-10-CM

## 2016-11-23 DIAGNOSIS — L03811 Cellulitis of head [any part, except face]: Secondary | ICD-10-CM | POA: Insufficient documentation

## 2016-11-23 DIAGNOSIS — I10 Essential (primary) hypertension: Secondary | ICD-10-CM | POA: Insufficient documentation

## 2016-11-23 LAB — BASIC METABOLIC PANEL
ANION GAP: 9 (ref 5–15)
BUN: 7 mg/dL (ref 6–20)
CO2: 25 mmol/L (ref 22–32)
Calcium: 8.8 mg/dL — ABNORMAL LOW (ref 8.9–10.3)
Chloride: 95 mmol/L — ABNORMAL LOW (ref 101–111)
Creatinine, Ser: 0.72 mg/dL (ref 0.61–1.24)
GFR calc Af Amer: >60 mL/min (ref >60)
Glucose, Bld: 549 mg/dL (ref 65–99)
Potassium: 3.9 mmol/L (ref 3.5–5.1)
SODIUM: 129 mmol/L — AB (ref 135–145)

## 2016-11-23 LAB — CBC
HCT: 40.6 % (ref 40.0–52.0)
Hemoglobin: 13.9 g/dL (ref 13.0–18.0)
MCH: 29.5 pg (ref 26.0–34.0)
MCHC: 34.2 g/dL (ref 32.0–36.0)
MCV: 86.3 fL (ref 80.0–100.0)
PLATELETS: 219 10*3/uL (ref 150–440)
RBC: 4.7 MIL/uL (ref 4.40–5.90)
RDW: 12.8 % (ref 11.5–14.5)
WBC: 8.6 10*3/uL (ref 3.8–10.6)

## 2016-11-23 LAB — GLUCOSE, CAPILLARY
GLUCOSE-CAPILLARY: 411 mg/dL — AB (ref 65–99)
Glucose-Capillary: 344 mg/dL — ABNORMAL HIGH (ref 65–99)

## 2016-11-23 MED ORDER — LIDOCAINE-PRILOCAINE 2.5-2.5 % EX CREA
TOPICAL_CREAM | Freq: Once | CUTANEOUS | Status: AC
  Administered 2016-11-23: 06:00:00 via TOPICAL
  Filled 2016-11-23: qty 5

## 2016-11-23 MED ORDER — CLINDAMYCIN HCL 150 MG PO CAPS
300.0000 mg | ORAL_CAPSULE | Freq: Once | ORAL | Status: AC
  Administered 2016-11-23: 300 mg via ORAL
  Filled 2016-11-23: qty 2

## 2016-11-23 MED ORDER — IOPAMIDOL (ISOVUE-300) INJECTION 61%
75.0000 mL | Freq: Once | INTRAVENOUS | Status: AC | PRN
  Administered 2016-11-23: 75 mL via INTRAVENOUS

## 2016-11-23 MED ORDER — TRAMADOL HCL 50 MG PO TABS: 50.0000 mg | ORAL_TABLET | Freq: Four times a day (QID) | ORAL | 0 refills | Status: DC | PRN

## 2016-11-23 MED ORDER — INSULIN ASPART 100 UNIT/ML ~~LOC~~ SOLN
8.0000 [IU] | Freq: Once | SUBCUTANEOUS | Status: AC
  Administered 2016-11-23: 8 [IU] via SUBCUTANEOUS
  Filled 2016-11-23: qty 1

## 2016-11-23 MED ORDER — CLINDAMYCIN HCL 150 MG PO CAPS
ORAL_CAPSULE | ORAL | Status: AC
  Filled 2016-11-23: qty 1

## 2016-11-23 MED ORDER — CLINDAMYCIN HCL 300 MG PO CAPS: 300.0000 mg | ORAL_CAPSULE | Freq: Three times a day (TID) | ORAL | 0 refills | Status: AC

## 2016-11-23 MED ORDER — SODIUM CHLORIDE 0.9 % IV BOLUS (SEPSIS)
1000.0000 mL | Freq: Once | INTRAVENOUS | Status: AC
  Administered 2016-11-23: 1000 mL via INTRAVENOUS

## 2016-11-23 NOTE — ED Notes (Signed)
Pt back from CT

## 2016-11-23 NOTE — ED Notes (Signed)
Date and time results received: 11/23/16 0457 (use smartphrase ".now" to insert current time)  Test: glucose Critical Value: 549  Name of Provider Notified: Dahlia Client  Orders Received? Or Actions Taken?: Orders Received - See Orders for details

## 2016-11-23 NOTE — ED Notes (Signed)
Patient transported to CT 

## 2016-11-23 NOTE — ED Notes (Signed)
AAOx3.  Skin warm and dry.  NAD 

## 2016-11-23 NOTE — Discharge Instructions (Signed)
Please return to the emergency department for further evaluation of her wound. I did place some packing in the wound and it does need to be reassessed within the next 24-48 hours. Please take her antibiotics to help improve her symptoms. Also continue to monitor your blood sugars as they were elevated this evening.

## 2016-11-23 NOTE — ED Triage Notes (Signed)
Pt ambulatory to triage with steady gait, no distress noted. Pt c/o cyst on back of head, no discharge noted. Pt seen 2 weeks ago for same, sent home with antibiotics. Pt to ED today due to cyst not decreasing in size.

## 2016-11-23 NOTE — Progress Notes (Signed)
Inpatient Diabetes Program Recommendations  AACE/ADA: New Consensus Statement on Inpatient Glycemic Control (2015)  Target Ranges:  Prepandial:   less than 140 mg/dL      Peak postprandial:   less than 180 mg/dL (1-2 hours)      Critically ill patients:  140 - 180 mg/dL   Lab Results  Component Value Date   GLUCAP 411 (H) 11/23/2016   HGBA1C 12.9 (H) 08/05/2013    Review of Glycemic Control  Results for Jonathan Mcmillan, Jonathan Mcmillan (MRN [id number]) as of 11/23/2016 08:12  Ref. Range 11/23/2016 07:52  Glucose-Capillary Latest Ref Range: 65 - 99 mg/dL 411 (H)    Diabetes history: Type 1 diabetes- patient of [facility] Diabetes medications: Novolog 70/30 15 units bid (from medical record) Current orders for Inpatient glycemic control: Novolog 8 units this am at 8.  Inpatient Diabetes Program Recommendations:  Patient has Type 1 diabetes- will require basal insulin this am to prevent DKA.  If the patient will remain in the ED, please give Levemir 10 units now and 1 unit of Novolog for meal coverage.  If he is being d/c'd home- please instruct the patient to take his Novolog 70/30 15 units when he gets home and to eat his breakfast.  Discussed with Dr. Dahlia Client by phone.   Confirmed with RN and patient that he takes Novolog 70/30 15 units bid, twice a day.  Gentry Fitz, RN, BA, MHA, CDE Diabetes Coordinator Inpatient Diabetes Program  [phone] (Team Pager) [phone] ([place]) 11/23/2016 8:22 AM

## 2016-11-23 NOTE — ED Notes (Signed)
Pt asleep and resting comfortably. 

## 2016-11-23 NOTE — ED Provider Notes (Signed)
[facility] Emergency Department Provider Note   ____________________________________________   First MD Initiated Contact with Patient 11/23/16 0408     (approximate)  I have reviewed the triage vital signs and the nursing notes.   HISTORY  Chief Complaint Abscess    HPI Jonathan Mcmillan is a 21 y.o. male who comes into the hospital today with a boil on the back of his head. He reports that he was here 2 weeks ago and given antibiotics. He reports though that it never went away. He wants to have it lanced. He was given tramadol and Bactrim. He states it is really bothering him. He's had boils before but nothing on the back of his head. The patient rates his pain a 10 out of 10 in intensity. He reports that he's not been placing any warm compresses to the area. It seemed to get worse over the last 2 days. He states that it never cleared up from when he was seen 2 weeks ago when the emergency department.   Past Medical History:  Diagnosis Date  . Diabetes 1.5, managed as type 1 [facility])     Patient Active Problem List   Diagnosis Date Noted  . Malnutrition of moderate degree 05/22/2016  . DKA (diabetic ketoacidoses) ([place]) 05/21/2016  . Type I (juvenile type) diabetes mellitus without mention of complication, uncontrolled 10/05/2010  . Hypertension 10/05/2010  . Obesity 10/05/2010    Past Surgical History:  Procedure Laterality Date  . none      Prior to Admission medications   Medication Sig Start Date End Date Taking? Authorizing Provider  insulin aspart protamine- aspart (NOVOLOG MIX 70/30) (70-30) 100 UNIT/ML injection Inject 0.13 mLs (13 Units total) into the skin 2 (two) times daily before a meal. Patient taking differently: Inject 15 Units into the skin 2 (two) times daily before a meal.  05/24/16  Yes Epifanio Lesches, MD  clindamycin (CLEOCIN) 300 MG capsule Take 1 capsule (300 mg total) by mouth 3 (three) times daily. 11/23/16 12/03/16   Loney Hering, MD  feeding supplement, GLUCERNA SHAKE, (GLUCERNA SHAKE) LIQD Take 237 mLs by mouth 3 (three) times daily between meals. 05/24/16   Epifanio Lesches, MD  traMADol (ULTRAM) 50 MG tablet Take 1 tablet (50 mg total) by mouth every 6 (six) hours as needed. 11/23/16   Loney Hering, MD    Allergies Patient has no known allergies.  Family History  Problem Relation Age of Onset  . Diabetes Mother     Social History Social History  Substance Use Topics  . Smoking status: Current Every Day Smoker  . Smokeless tobacco: Never Used  . Alcohol use Yes    Review of Systems  Constitutional: No fever/chills Eyes: No visual changes. ENT: No sore throat. Cardiovascular: Denies chest pain. Respiratory: Denies shortness of breath. Gastrointestinal: No abdominal pain.  No nausea, no vomiting.  No diarrhea.  No constipation. Genitourinary: Negative for dysuria. Musculoskeletal: Negative for back pain. Skin: Abscess to occipital area  Neurological: Negative for headaches, focal weakness or numbness.   ____________________________________________   PHYSICAL EXAM:  VITAL SIGNS: ED Triage Vitals  Enc Vitals Group     BP 11/23/16 0102 121/72     Pulse Rate 11/23/16 0102 88     Resp 11/23/16 0102 16     Temp 11/23/16 0102 98.6 F (37 C)     Temp Source 11/23/16 0102 Oral     SpO2 11/23/16 0102 99 %     Weight --  Height --      Head Circumference --      Peak Flow --      Pain Score 11/23/16 0344 10     Pain Loc --      Pain Edu? --      Excl. in [place]? --     Constitutional: Alert and oriented. Well appearing and in Mild distress. Eyes: Conjunctivae are normal. PERRL. EOMI. Head: Atraumatic. Soft tissue swelling to the posterior occiput extensive swelling. There is some induration with a small area of fluctuance. Swelling extends across the entire occiput. Nose: No congestion/rhinnorhea. Mouth/Throat: Mucous membranes are moist.  Oropharynx  non-erythematous. Cardiovascular: Normal rate, regular rhythm. Grossly normal heart sounds.  Good peripheral circulation. Respiratory: Normal respiratory effort.  No retractions. Lungs CTAB. Gastrointestinal: Soft and nontender. No distention. Positive bowel sounds Musculoskeletal: No lower extremity tenderness nor edema.   Neurologic:  Normal speech and language.  Skin:  Skin is warm, dry and intact. [name] Psychiatric: Mood and affect are normal.   ____________________________________________   LABS (all labs ordered are listed, but only abnormal results are displayed)  Labs Reviewed  BASIC METABOLIC PANEL - Abnormal; Notable for the following:       Result Value   Sodium 129 (*)    Chloride 95 (*)    Glucose, Bld 549 (*)    Calcium 8.8 (*)    All other components within normal limits  GLUCOSE, CAPILLARY - Abnormal; Notable for the following:    Glucose-Capillary 411 (*)    All other components within normal limits  CBC  CBG MONITORING, ED   ____________________________________________  EKG  none ____________________________________________  RADIOLOGY  Ct Soft Tissue Neck W Contrast  Result Date: 11/23/2016 CLINICAL DATA:  Initial evaluation for swollen area to occipital it. Evaluate for abscess. EXAM: CT NECK WITH CONTRAST TECHNIQUE: Multidetector CT imaging of the neck was performed using the standard protocol following the bolus administration of intravenous contrast. CONTRAST:  21mL ISOVUE-300 IOPAMIDOL (ISOVUE-300) INJECTION 61% COMPARISON:  None. FINDINGS: Pharynx and larynx: Oral cavity within normal limits without mass lesion or loculated fluid collection. No acute abnormality about the dentition. Scattered dental caries noted. [place] tonsils within normal limits. Parapharyngeal fat preserved. Nasopharynx normal. Retropharyngeal soft tissues within normal limits. Epiglottis normal. Remainder of the hypopharynx and supraglottic larynx within normal limits. True cords  symmetric and normal. Subglottic airway clear. Salivary glands: Salivary glands within normal limits. Thyroid: Thyroid normal. Lymph nodes: Few mildly prominent lymph nodes noted within the posterior neck, measuring up to 1 cm, likely reactive. No other adenopathy. Vascular: Normal intravascular enhancement seen throughout the neck. Limited intracranial: Unremarkable. Visualized orbits: Unremarkable. Mastoids and visualized paranasal sinuses: Paranasal sinuses are largely clear. Small retention cyst noted within the left maxillary sinus. Mastoids and middle ear cavities are clear. Skeleton: No acute osseus abnormality. No worrisome lytic or blastic osseous lesions. Upper chest: Visualized upper chest within normal limits. Partially visualized lungs are clear. Other: Extensive soft tissue swelling with inflammatory stranding present within the occipital scalp, extending inferiorly down the posterior neck, consistent with cellulitis. Superimposed irregular hypodense rim enhancing collection within the left occipital scalp measures 1.8 x 3.3 x 2.0 cm, consistent with abscess (series 2, image 21). Few additional subcentimeter hypodense pockets noted. Collection positioned approximately 3 mm deep to the skin. IMPRESSION: 1.8 x 3.3 x 2.0 cm abscess at the left occipital scalp. Associated extensive swelling with inflammatory stranding throughout the occipital scalp/posterior neck consistent with associated cellulitis. Electronically Signed   By:  Jeannine Boga M.D.   On: 11/23/2016 06:09    ____________________________________________   PROCEDURES  Procedure(s) performed: please, see procedure note(s).  [name].Incision and Drainage Date/Time: 11/23/2016 7:28 AM Performed by: Loney Hering Authorized by: Loney Hering   Consent:    Consent obtained:  Verbal   Consent given by:  Patient Location:    Type:  Abscess   Size:  3 x 2 x 1   Location:  Head   Head location:  Scalp Pre-procedure  details:    Skin preparation:  Chloraprep Anesthesia (see MAR for exact dosages):    Anesthesia method:  Topical application   Topical anesthetic:  EMLA cream Procedure type:    Complexity:  Simple Procedure details:    Incision types:  Stab incision   Scalpel blade:  11   Wound management:  Probed and deloculated   Drainage:  Purulent and serosanguinous   Drainage amount:  Moderate   Packing materials:  1/4 in iodoform gauze Post-procedure details:    Patient tolerance of procedure:  Tolerated well, no immediate complications    Critical Care performed: No  ____________________________________________   INITIAL IMPRESSION / ASSESSMENT AND PLAN / ED COURSE  Pertinent labs & imaging results that were available during my care of the patient were reviewed by me and considered in my medical decision making (see chart for details).  This is a 21 year old male who comes into the hospital today with some soft tissue swelling to his occiput. The patient is a diabetic although he initially states he has no medical problems. I did check some blood work and I will send the patient for a CT scan as he does have some significant swelling. I am sure if this is an abscess or another cystic structure. I will reassess the patient once I have the CT is resolved.     The patient's CT shows an abscess. I did drain the abscess and had some purulent drainage come forth. I was able to pack the abscess as well. I checked some blood work and the patient's blood sugars of 500. I gave him a liter of normal saline and afterwards it was 411. He will receive another liter and 8 units of subcutaneous insulin. I will give the patient some clindamycin and he will be discharged to home once his blood sugars have improved. The patient should return for reassessment of his wound and changing of his packing in the next 24-48 hours. ____________________________________________   FINAL CLINICAL IMPRESSION(S) / ED  DIAGNOSES  Final diagnoses:  Abscess  Cellulitis of head except face  Hyperglycemia      NEW MEDICATIONS STARTED DURING THIS VISIT:  New Prescriptions   CLINDAMYCIN (CLEOCIN) 300 MG CAPSULE    Take 1 capsule (300 mg total) by mouth 3 (three) times daily.   TRAMADOL (ULTRAM) 50 MG TABLET    Take 1 tablet (50 mg total) by mouth every 6 (six) hours as needed.     Note:  This document was prepared using Dragon voice recognition software and may include unintentional dictation errors.    Loney Hering, MD 11/23/16 [phone]

## 2016-11-24 ENCOUNTER — Encounter: Payer: Self-pay | Admitting: Emergency Medicine

## 2016-11-24 ENCOUNTER — Emergency Department
Admission: EM | Admit: 2016-11-24 | Discharge: 2016-11-24 | Disposition: A | Discharge status: Home / Self Care | Payer: Self-pay | Attending: Emergency Medicine | Admitting: Emergency Medicine

## 2016-11-24 DIAGNOSIS — I10 Essential (primary) hypertension: Secondary | ICD-10-CM | POA: Insufficient documentation

## 2016-11-24 DIAGNOSIS — E109 Type 1 diabetes mellitus without complications: Secondary | ICD-10-CM | POA: Insufficient documentation

## 2016-11-24 DIAGNOSIS — Z794 Long term (current) use of insulin: Secondary | ICD-10-CM | POA: Insufficient documentation

## 2016-11-24 DIAGNOSIS — Z79899 Other long term (current) drug therapy: Secondary | ICD-10-CM | POA: Insufficient documentation

## 2016-11-24 DIAGNOSIS — Z5189 Encounter for other specified aftercare: Secondary | ICD-10-CM

## 2016-11-24 DIAGNOSIS — F17200 Nicotine dependence, unspecified, uncomplicated: Secondary | ICD-10-CM | POA: Insufficient documentation

## 2016-11-24 DIAGNOSIS — E1010 Type 1 diabetes mellitus with ketoacidosis without coma: Secondary | ICD-10-CM | POA: Insufficient documentation

## 2016-11-24 DIAGNOSIS — Z4800 Encounter for change or removal of nonsurgical wound dressing: Secondary | ICD-10-CM | POA: Insufficient documentation

## 2016-11-24 NOTE — ED Triage Notes (Signed)
Had wound back of head drained here during night. Packing came out. Here for wound check.

## 2016-11-24 NOTE — ED Notes (Signed)
See triage note  States he was here yesterday and had abscess lanced and packed ..states packing was removed last pm when he removed the dressing

## 2016-11-24 NOTE — Discharge Instructions (Signed)
Continue to keep the wound clean and dry and covered with gauze and tape. Do not cover the wound where the wound is completely sealed off. Monitor for signs of infection and return to your primary care provider or the emergency department. Continue taken prescribed antibiotics as directed.

## 2016-11-24 NOTE — ED Provider Notes (Signed)
[facility] Emergency Department Provider Note   ____________________________________________   I have reviewed the triage vital signs and the nursing notes.   HISTORY  Chief Complaint Wound Check    HPI Jonathan Mcmillan is a 21 y.o. male presented to the emergency department for a wound recheck. Patient was here yesterday for an abscess I&D. Patient stated some time during the night the wound packing came out and he has returned to have a wound recheck. Patient reports compliance with taking his antibiotics at this time. He endorses some discomfort around the wound area however overall much improved pain compared to prior to the abscess being drained. He continues to note no complications at this time. Patient denies fever, chills, headache, vision changes, chest pain, chest tightness, shortness of breath, abdominal pain, nausea and vomiting.  Past Medical History:  Diagnosis Date  . Diabetes 1.5, managed as type 1 [facility])     Patient Active Problem List   Diagnosis Date Noted  . Malnutrition of moderate degree 05/22/2016  . DKA (diabetic ketoacidoses) ([place]) 05/21/2016  . Type I (juvenile type) diabetes mellitus without mention of complication, uncontrolled 10/05/2010  . Hypertension 10/05/2010  . Obesity 10/05/2010    Past Surgical History:  Procedure Laterality Date  . none      Prior to Admission medications   Medication Sig Start Date End Date Taking? Authorizing Provider  clindamycin (CLEOCIN) 300 MG capsule Take 1 capsule (300 mg total) by mouth 3 (three) times daily. 11/23/16 12/03/16  Loney Hering, MD  feeding supplement, GLUCERNA SHAKE, (GLUCERNA SHAKE) LIQD Take 237 mLs by mouth 3 (three) times daily between meals. 05/24/16   Epifanio Lesches, MD  insulin aspart protamine- aspart (NOVOLOG MIX 70/30) (70-30) 100 UNIT/ML injection Inject 0.13 mLs (13 Units total) into the skin 2 (two) times daily before a meal. Patient taking  differently: Inject 15 Units into the skin 2 (two) times daily before a meal.  05/24/16   Epifanio Lesches, MD  traMADol (ULTRAM) 50 MG tablet Take 1 tablet (50 mg total) by mouth every 6 (six) hours as needed. 11/23/16   Loney Hering, MD    Allergies Patient has no known allergies.  Family History  Problem Relation Age of Onset  . Diabetes Mother     Social History Social History  Substance Use Topics  . Smoking status: Current Every Day Smoker  . Smokeless tobacco: Never Used  . Alcohol use Yes    Constitutional: No fever/chills Eyes: No visual changes. ENT: No sore throat. Cardiovascular: Denies chest pain. Respiratory: Denies shortness of breath. Gastrointestinal: No abdominal pain.  No nausea, no vomiting. No diarrea. Genitourinary: Negative for dysuria. Musculoskeletal: Negative for back pain. Skin: Wound to occipital area.  Neurological: Negative for headaches, focal weakness or numbness. ____________________________________________   PHYSICAL EXAM:  VITAL SIGNS: ED Triage Vitals  Enc Vitals Group     BP 11/24/16 1044 126/89     Pulse Rate 11/24/16 1044 100     Resp 11/24/16 1044 18     Temp 11/24/16 1044 (!) 97.5 F (36.4 C)     Temp Source 11/24/16 1044 Oral     SpO2 11/24/16 1044 99 %     Weight 11/24/16 1046 160 lb (72.6 kg)     Height 11/24/16 1046 5\' 8"  (1.727 m)     Head Circumference --      Peak Flow --      Pain Score 11/24/16 1043 5  Pain Loc --      Pain Edu? --      Excl. in [place]? --     Constitutional: Alert and oriented. Well appearing and in no acute distress.  Head: Normocephalic and atraumatic. Mild swelling along the posterior occiput with wound status post I&D from previous abscess. No drainage noted.  Eyes: Conjunctivae are normal. PERRL. Normal extraocular movements. Cardiovascular: Normal rate, regular rhythm. Normal distal pulses. Respiratory: Normal respiratory effort. No wheezes/rales/rhonchi. Lungs  CTAB Gastrointestinal: Soft and nontender. No distention. Musculoskeletal: Nontender with normal range of motion in all extremities. Neurologic: Normal speech and language.  Skin:  Skin is warm, dry and intact. No rash noted. Psychiatric: Mood and affect are normal.  ____________________________________________   LABS (all labs ordered are listed, but only abnormal results are displayed)  Labs Reviewed - No data to display ____________________________________________  EKG None ____________________________________________  RADIOLOGY None ____________________________________________   PROCEDURES  Procedure(s) performed:  Inspected and cleaned the wound area. No signs of draining, minimal swelling and no erythema noted. Showing signs of healing. No loud will room to repack at this time. 2 x 2 gauze placed over wound and secured with paper tape.   Critical Care performed: no ____________________________________________   INITIAL IMPRESSION / ASSESSMENT AND PLAN / ED COURSE  Pertinent labs & imaging results that were available during my care of the patient were reviewed by me and considered in my medical decision making (see chart for details).  Patient presented for wound recheck. He was here yesterday for an I&D of an abscess along the posterior occiput. History, physical exam reassuring wound was healing appropriately, patient compliant with anabolic set this time and his vital signs were reassuring. Wound was inspected, cleaned and redressed. Patient was advised to keep wound clean, covered with nonocclusive dressing and dry. Advised him to continue monitoring the wound area and if he noted signs of infection to return to the emergency department. Patient informed of clinical course, understand medical decision-making process, and agree with plan. Patient was advised to follow up with PCP as needed and was also advised to return to the emergency department for symptoms that  change or worsen.    ____________________________________________   FINAL CLINICAL IMPRESSION(S) / ED DIAGNOSES  Final diagnoses:  None       NEW MEDICATIONS STARTED DURING THIS VISIT:  New Prescriptions   No medications on file     Note:  This document was prepared using Dragon voice recognition software and may include unintentional dictation errors.    Jerolyn Shin, PA-C 11/24/16 1134    Prince Olivier, Laroy Apple, PA-C 11/24/16 1136    Delman Kitten, MD 11/24/16 [phone]

## 2017-02-03 ENCOUNTER — Emergency Department
Admission: EM | Admit: 2017-02-03 | Discharge: 2017-02-03 | Disposition: A | Discharge status: Home / Self Care | Payer: Self-pay | Attending: Emergency Medicine | Admitting: Emergency Medicine

## 2017-02-03 ENCOUNTER — Encounter: Payer: Self-pay | Admitting: Emergency Medicine

## 2017-02-03 DIAGNOSIS — R739 Hyperglycemia, unspecified: Secondary | ICD-10-CM

## 2017-02-03 DIAGNOSIS — R55 Syncope and collapse: Secondary | ICD-10-CM | POA: Insufficient documentation

## 2017-02-03 DIAGNOSIS — Z79899 Other long term (current) drug therapy: Secondary | ICD-10-CM | POA: Insufficient documentation

## 2017-02-03 DIAGNOSIS — E1065 Type 1 diabetes mellitus with hyperglycemia: Secondary | ICD-10-CM | POA: Insufficient documentation

## 2017-02-03 DIAGNOSIS — Z794 Long term (current) use of insulin: Secondary | ICD-10-CM | POA: Insufficient documentation

## 2017-02-03 DIAGNOSIS — L02416 Cutaneous abscess of left lower limb: Secondary | ICD-10-CM | POA: Insufficient documentation

## 2017-02-03 DIAGNOSIS — L0291 Cutaneous abscess, unspecified: Secondary | ICD-10-CM

## 2017-02-03 DIAGNOSIS — F17210 Nicotine dependence, cigarettes, uncomplicated: Secondary | ICD-10-CM | POA: Insufficient documentation

## 2017-02-03 DIAGNOSIS — I10 Essential (primary) hypertension: Secondary | ICD-10-CM | POA: Insufficient documentation

## 2017-02-03 LAB — CBC WITH DIFFERENTIAL/PLATELET
Basophils Absolute: 0 10*3/uL (ref 0–0.1)
Basophils Relative: 0 %
EOS ABS: 0.3 10*3/uL (ref 0–0.7)
Eosinophils Relative: 2 %
HCT: 38.4 % — ABNORMAL LOW (ref 40.0–52.0)
Hemoglobin: 13.2 g/dL (ref 13.0–18.0)
LYMPHS ABS: 1.9 10*3/uL (ref 1.0–3.6)
Lymphocytes Relative: 12 %
MCH: 28.5 pg (ref 26.0–34.0)
MCHC: 34.5 g/dL (ref 32.0–36.0)
MCV: 82.7 fL (ref 80.0–100.0)
Monocytes Absolute: 1.2 10*3/uL — ABNORMAL HIGH (ref 0.2–1.0)
Monocytes Relative: 8 %
Neutro Abs: 12.3 10*3/uL — ABNORMAL HIGH (ref 1.4–6.5)
Neutrophils Relative %: 78 %
PLATELETS: 369 10*3/uL (ref 150–440)
RBC: 4.65 MIL/uL (ref 4.40–5.90)
RDW: 12.7 % (ref 11.5–14.5)
WBC: 15.8 10*3/uL — AB (ref 3.8–10.6)

## 2017-02-03 LAB — GLUCOSE, CAPILLARY: Glucose-Capillary: 371 mg/dL — ABNORMAL HIGH (ref 65–99)

## 2017-02-03 LAB — URINALYSIS, COMPLETE (UACMP) WITH MICROSCOPIC
BACTERIA UA: NONE SEEN
Bilirubin Urine: NEGATIVE
Ketones, ur: 20 mg/dL — AB
Leukocytes, UA: NEGATIVE
Nitrite: NEGATIVE
PROTEIN: NEGATIVE mg/dL
Specific Gravity, Urine: 1.022 (ref 1.005–1.030)
Squamous Epithelial / LPF: NONE SEEN
WBC UA: NONE SEEN WBC/hpf (ref 0–5)
pH: 7 (ref 5.0–8.0)

## 2017-02-03 LAB — BASIC METABOLIC PANEL
ANION GAP: 14 (ref 5–15)
BUN: 9 mg/dL (ref 6–20)
CALCIUM: 9 mg/dL (ref 8.9–10.3)
CO2: 27 mmol/L (ref 22–32)
Chloride: 93 mmol/L — ABNORMAL LOW (ref 101–111)
Creatinine, Ser: 0.65 mg/dL (ref 0.61–1.24)
Glucose, Bld: 485 mg/dL — ABNORMAL HIGH (ref 65–99)
POTASSIUM: 3.8 mmol/L (ref 3.5–5.1)
SODIUM: 134 mmol/L — AB (ref 135–145)

## 2017-02-03 LAB — LACTIC ACID, PLASMA: Lactic Acid, Venous: 1.1 mmol/L (ref 0.5–1.9)

## 2017-02-03 MED ORDER — SODIUM CHLORIDE 0.9 % IV BOLUS (SEPSIS)
1000.0000 mL | Freq: Once | INTRAVENOUS | Status: AC
  Administered 2017-02-03: 1000 mL via INTRAVENOUS

## 2017-02-03 MED ORDER — INSULIN ASPART PROT & ASPART (70-30 MIX) 100 UNIT/ML ~~LOC~~ SUSP: 13.0000 [IU] | Freq: Two times a day (BID) | SUBCUTANEOUS | 11 refills | Status: DC

## 2017-02-03 MED ORDER — VANCOMYCIN HCL IN DEXTROSE 1-5 GM/200ML-% IV SOLN
1000.0000 mg | Freq: Once | INTRAVENOUS | Status: AC
  Administered 2017-02-03: 1000 mg via INTRAVENOUS
  Filled 2017-02-03: qty 200

## 2017-02-03 MED ORDER — SULFAMETHOXAZOLE-TRIMETHOPRIM 800-160 MG PO TABS: 1.0000 | ORAL_TABLET | Freq: Two times a day (BID) | ORAL | 0 refills | Status: AC

## 2017-02-03 NOTE — ED Triage Notes (Signed)
Arrives via [facility] with c/o hyperglycemia and near syncope.  Patient reports having a left thigh abscess x 1 week, that started draining x 3 days.  States he has not been taking his insulin "for a while" due to being unemployed and not being able to afford medication.  Patient is seen through [facility].

## 2017-02-03 NOTE — Discharge Instructions (Signed)
Please seek medical attention for any high fevers, chest pain, shortness of breath, change in behavior, persistent vomiting, bloody stool or any other new or concerning symptoms.  

## 2017-02-03 NOTE — ED Notes (Signed)
Patient denies pain and is resting comfortably.  

## 2017-02-03 NOTE — ED Notes (Signed)
Left thigh abscess noted.  Center open with red purulent drainage.  Wound Cx obtained and sent.

## 2017-02-03 NOTE — ED Provider Notes (Signed)
[facility] Emergency Department Provider Note  ____________________________________________   I have reviewed the triage vital signs and the nursing notes.   HISTORY  Chief Complaint Hyperglycemia and Near Syncope   History limited by: Not Limited   HPI Jonathan Mcmillan is a 21 y.o. male who presents to the emergency department today . EMS after syncopal episode. Patient states he had just gotten up and was walking to the bathroom. States he felt lightheaded and then fell onto the ground. He does not think he was out for very long. He did not suffer any significant injury from the fall. He denies any associated chest pain or shortness of breath. The patient states that he does have a history of insulin-dependent diabetes however hasn't had his insulin for months. In addition he is complaining of a left thigh abscess. He states that he first noticed about a week ago however 3 days ago it started draining. It has been draining since then. He does have pain to that area. He denies any fevers.   Past Medical History:  Diagnosis Date  . Diabetes 1.5, managed as type 1 [facility])     Patient Active Problem List   Diagnosis Date Noted  . Malnutrition of moderate degree 05/22/2016  . DKA (diabetic ketoacidoses) ([place]) 05/21/2016  . Type I (juvenile type) diabetes mellitus without mention of complication, uncontrolled 10/05/2010  . Hypertension 10/05/2010  . Obesity 10/05/2010    Past Surgical History:  Procedure Laterality Date  . none      Prior to Admission medications   Medication Sig Start Date End Date Taking? Authorizing Provider  feeding supplement, GLUCERNA SHAKE, (GLUCERNA SHAKE) LIQD Take 237 mLs by mouth 3 (three) times daily between meals. 05/24/16   Epifanio Lesches, MD  insulin aspart protamine- aspart (NOVOLOG MIX 70/30) (70-30) 100 UNIT/ML injection Inject 0.13 mLs (13 Units total) into the skin 2 (two) times daily before a meal. Patient taking  differently: Inject 15 Units into the skin 2 (two) times daily before a meal.  05/24/16   Epifanio Lesches, MD  traMADol (ULTRAM) 50 MG tablet Take 1 tablet (50 mg total) by mouth every 6 (six) hours as needed. 11/23/16   Loney Hering, MD    Allergies Patient has no known allergies.  Family History  Problem Relation Age of Onset  . Diabetes Mother     Social History Social History  Substance Use Topics  . Smoking status: Current Every Day Smoker    Types: Cigarettes  . Smokeless tobacco: Never Used  . Alcohol use Yes    Review of Systems Constitutional: No fever/chills Eyes: No visual changes. ENT: No sore throat. Cardiovascular: Denies chest pain. Respiratory: Denies shortness of breath. Gastrointestinal: No abdominal pain.  No nausea, no vomiting.  No diarrhea.   Genitourinary: Negative for dysuria. Musculoskeletal: positive left thigh pain Skin: positive abscess in the left thigh Neurological: positive for syncopal episode  ____________________________________________   PHYSICAL EXAM:  VITAL SIGNS: ED Triage Vitals [02/03/17 1111]  Enc Vitals Group     BP (!) 115/91     Pulse Rate 97     Resp 16     Temp 97.7 F (36.5 C)     Temp Source Oral     SpO2 99 %     Weight 130 lb (59 kg)     Height 5\' 9"  (1.753 m)     Head Circumference      Peak Flow    Constitutional: Alert and oriented.  Well appearing and in no distress. Eyes: Conjunctivae are normal.  ENT   Head: Normocephalic and atraumatic.   Nose: No congestion/rhinnorhea.   Mouth/Throat: Mucous membranes are moist.   Neck: No stridor. Hematological/Lymphatic/Immunilogical: No cervical lymphadenopathy. Cardiovascular: Normal rate, regular rhythm.  No murmurs, rubs, or gallops.  Respiratory: Normal respiratory effort without tachypnea nor retractions. Breath sounds are clear and equal bilaterally. No wheezes/rales/rhonchi. Gastrointestinal: Soft and non tender. No rebound. No  guarding.  Genitourinary: Deferred Musculoskeletal: Normal range of motion in all extremities. No lower extremity edema. Neurologic:  Normal speech and language. No gross focal neurologic deficits are appreciated.  Skin:  abscess noticed to left thigh with active drainage. Psychiatric: Mood and affect are normal. Speech and behavior are normal. Patient exhibits appropriate insight and judgment.  ____________________________________________    LABS (pertinent positives/negatives)  CBC WBC: 15.8 Hgb: 13.2 Plt: 369 Lactic 1.1 BMP Na 134 K 3.8 Glu 485 PH 7.34 UA without leukocytes, nitrites or bacteria.    ____________________________________________   EKG  None  ____________________________________________    RADIOLOGY  None  ____________________________________________   PROCEDURES  Procedures  ____________________________________________   INITIAL IMPRESSION / ASSESSMENT AND PLAN / ED COURSE  Pertinent labs & imaging results that were available during my care of the patient were reviewed by me and considered in my medical decision making (see chart for details).  patient presented to the emergency department today after a syncopal episode. Patient sugars were found to be extremely elevated. He was given IV fluids and his sugars did improve. In addition patient was found to have an abscess to his left inner thigh. This was actively draining and I did help express a moderate amount of pus. The wound was open afterwards. This point I do not feel would get much benefit from incising. Patient was given dose of antibiotics here in the emergency department. Will plan on discharging home with further antibiotics and will give patient prescription for insulin.  ____________________________________________   FINAL CLINICAL IMPRESSION(S) / ED DIAGNOSES  Final diagnoses:  Hyperglycemia  Abscess  Syncope, unspecified syncope type     Note: This dictation was  prepared with Dragon dictation. Any transcriptional errors that result from this process are unintentional     Nance Pear, MD 02/03/17 1410

## 2017-02-03 NOTE — ED Notes (Signed)
AAOx3.  Skin warm and dry.  NAD 

## 2017-02-04 LAB — BLOOD GAS, VENOUS
ACID-BASE EXCESS: 2.1 mmol/L — AB (ref 0.0–2.0)
BICARBONATE: 29.1 mmol/L — AB (ref 20.0–28.0)
FIO2: 0.21
PATIENT TEMPERATURE: 37
pCO2, Ven: 54 mmHg (ref 44.0–60.0)
pH, Ven: 7.34 (ref 7.250–7.430)

## 2017-02-06 LAB — AEROBIC CULTURE  (SUPERFICIAL SPECIMEN)

## 2017-02-06 LAB — AEROBIC CULTURE W GRAM STAIN (SUPERFICIAL SPECIMEN): Gram Stain: NONE SEEN

## 2017-03-06 ENCOUNTER — Emergency Department: Payer: Self-pay

## 2017-03-06 ENCOUNTER — Encounter: Payer: Self-pay | Admitting: Emergency Medicine

## 2017-03-06 ENCOUNTER — Inpatient Hospital Stay
Admission: EM | Admit: 2017-03-06 | Discharge: 2017-03-08 | DRG: 854 | Disposition: A | Discharge status: Home / Self Care | Payer: Self-pay | Attending: Internal Medicine | Admitting: Internal Medicine

## 2017-03-06 DIAGNOSIS — Z681 Body mass index (BMI) 19 or less, adult: Secondary | ICD-10-CM

## 2017-03-06 DIAGNOSIS — A419 Sepsis, unspecified organism: Principal | ICD-10-CM | POA: Diagnosis present

## 2017-03-06 DIAGNOSIS — E10649 Type 1 diabetes mellitus with hypoglycemia without coma: Secondary | ICD-10-CM | POA: Diagnosis present

## 2017-03-06 DIAGNOSIS — L0291 Cutaneous abscess, unspecified: Secondary | ICD-10-CM

## 2017-03-06 DIAGNOSIS — L0201 Cutaneous abscess of face: Secondary | ICD-10-CM | POA: Diagnosis present

## 2017-03-06 DIAGNOSIS — E440 Moderate protein-calorie malnutrition: Secondary | ICD-10-CM | POA: Diagnosis present

## 2017-03-06 DIAGNOSIS — L03211 Cellulitis of face: Secondary | ICD-10-CM | POA: Diagnosis present

## 2017-03-06 DIAGNOSIS — F17210 Nicotine dependence, cigarettes, uncomplicated: Secondary | ICD-10-CM | POA: Diagnosis present

## 2017-03-06 DIAGNOSIS — E1065 Type 1 diabetes mellitus with hyperglycemia: Secondary | ICD-10-CM | POA: Diagnosis present

## 2017-03-06 DIAGNOSIS — M6008 Infective myositis, other site: Secondary | ICD-10-CM | POA: Diagnosis present

## 2017-03-06 DIAGNOSIS — R739 Hyperglycemia, unspecified: Secondary | ICD-10-CM

## 2017-03-06 LAB — GLUCOSE, CAPILLARY
GLUCOSE-CAPILLARY: 558 mg/dL — AB (ref 65–99)
Glucose-Capillary: 443 mg/dL — ABNORMAL HIGH (ref 65–99)
Glucose-Capillary: 358 mg/dL — ABNORMAL HIGH (ref 65–99)
Glucose-Capillary: 283 mg/dL — ABNORMAL HIGH (ref 65–99)
Glucose-Capillary: 433 mg/dL — ABNORMAL HIGH (ref 65–99)
Glucose-Capillary: 164 mg/dL — ABNORMAL HIGH (ref 65–99)
Glucose-Capillary: 164 mg/dL — ABNORMAL HIGH (ref 65–99)
Glucose-Capillary: 57 mg/dL — ABNORMAL LOW (ref 65–99)
Glucose-Capillary: 67 mg/dL (ref 65–99)
Glucose-Capillary: 247 mg/dL — ABNORMAL HIGH (ref 65–99)

## 2017-03-06 LAB — CBC WITH DIFFERENTIAL/PLATELET
BASOS PCT: 1 %
Basophils Absolute: 0.2 10*3/uL — ABNORMAL HIGH (ref 0–0.1)
EOS ABS: 0.3 10*3/uL (ref 0–0.7)
Eosinophils Relative: 2 %
HCT: 41.3 % (ref 40.0–52.0)
Hemoglobin: 13.4 g/dL (ref 13.0–18.0)
Lymphocytes Relative: 14 %
Lymphs Abs: 2.3 10*3/uL (ref 1.0–3.6)
MCH: 27.9 pg (ref 26.0–34.0)
MCHC: 32.4 g/dL (ref 32.0–36.0)
MCV: 85.9 fL (ref 80.0–100.0)
MONO ABS: 1.2 10*3/uL — AB (ref 0.2–1.0)
MONOS PCT: 7 %
Neutro Abs: 13 10*3/uL — ABNORMAL HIGH (ref 1.4–6.5)
Neutrophils Relative %: 76 %
Platelets: 414 10*3/uL (ref 150–440)
RBC: 4.81 MIL/uL (ref 4.40–5.90)
RDW: 13.4 % (ref 11.5–14.5)
WBC: 17 10*3/uL — ABNORMAL HIGH (ref 3.8–10.6)

## 2017-03-06 LAB — URINALYSIS, COMPLETE (UACMP) WITH MICROSCOPIC
BILIRUBIN URINE: NEGATIVE
KETONES UR: NEGATIVE mg/dL
LEUKOCYTES UA: NEGATIVE
NITRITE: NEGATIVE
PROTEIN: NEGATIVE mg/dL
SQUAMOUS EPITHELIAL / LPF: NONE SEEN
Specific Gravity, Urine: 1.029 (ref 1.005–1.030)
pH: 7 (ref 5.0–8.0)

## 2017-03-06 LAB — COMPREHENSIVE METABOLIC PANEL
ALBUMIN: 3.4 g/dL — AB (ref 3.5–5.0)
ALT: 11 U/L — ABNORMAL LOW (ref 17–63)
ANION GAP: 11 (ref 5–15)
AST: 15 U/L (ref 15–41)
Alkaline Phosphatase: 166 U/L — ABNORMAL HIGH (ref 38–126)
BILIRUBIN TOTAL: 0.7 mg/dL (ref 0.3–1.2)
BUN: 5 mg/dL — ABNORMAL LOW (ref 6–20)
CO2: 27 mmol/L (ref 22–32)
Calcium: 9.2 mg/dL (ref 8.9–10.3)
Chloride: 92 mmol/L — ABNORMAL LOW (ref 101–111)
Creatinine, Ser: 0.57 mg/dL — ABNORMAL LOW (ref 0.61–1.24)
GFR calc non Af Amer: >60 mL/min (ref >60)
GLUCOSE: 545 mg/dL — AB (ref 65–99)
Potassium: 4 mmol/L (ref 3.5–5.1)
SODIUM: 130 mmol/L — AB (ref 135–145)
TOTAL PROTEIN: 8.1 g/dL (ref 6.5–8.1)

## 2017-03-06 LAB — TSH: TSH: 1.43 u[IU]/mL (ref 0.350–4.500)

## 2017-03-06 LAB — LACTIC ACID, PLASMA: Lactic Acid, Venous: 1.9 mmol/L (ref 0.5–1.9)

## 2017-03-06 LAB — HEMOGLOBIN A1C
Hgb A1c MFr Bld: 14.1 % — ABNORMAL HIGH (ref 4.8–5.6)
Mean Plasma Glucose: 357.97 mg/dL

## 2017-03-06 MED ORDER — ONDANSETRON HCL 4 MG PO TABS: 4.0000 mg | ORAL_TABLET | Freq: Four times a day (QID) | ORAL | Status: DC | PRN

## 2017-03-06 MED ORDER — INSULIN GLARGINE 100 UNIT/ML ~~LOC~~ SOLN
20.0000 [IU] | Freq: Every day | SUBCUTANEOUS | Status: DC
  Filled 2017-03-06: qty 0.2

## 2017-03-06 MED ORDER — INSULIN ASPART 100 UNIT/ML ~~LOC~~ SOLN
0.0000 [IU] | Freq: Every day | SUBCUTANEOUS | Status: DC
  Administered 2017-03-06: 2 [IU] via SUBCUTANEOUS
  Administered 2017-03-07: 3 [IU] via SUBCUTANEOUS
  Filled 2017-03-06 (×2): qty 1

## 2017-03-06 MED ORDER — ONDANSETRON HCL 4 MG/2ML IJ SOLN
4.0000 mg | Freq: Once | INTRAMUSCULAR | Status: AC
  Administered 2017-03-06: 4 mg via INTRAVENOUS
  Filled 2017-03-06: qty 2

## 2017-03-06 MED ORDER — LIDOCAINE-PRILOCAINE 2.5-2.5 % EX CREA
TOPICAL_CREAM | Freq: Once | CUTANEOUS | Status: AC
  Administered 2017-03-06: 06:00:00 via TOPICAL

## 2017-03-06 MED ORDER — VANCOMYCIN HCL IN DEXTROSE 1-5 GM/200ML-% IV SOLN
1000.0000 mg | Freq: Once | INTRAVENOUS | Status: AC
  Administered 2017-03-06: 1000 mg via INTRAVENOUS
  Filled 2017-03-06: qty 200

## 2017-03-06 MED ORDER — MORPHINE SULFATE (PF) 4 MG/ML IV SOLN
4.0000 mg | Freq: Once | INTRAVENOUS | Status: AC
  Administered 2017-03-06: 4 mg via INTRAVENOUS
  Filled 2017-03-06: qty 1

## 2017-03-06 MED ORDER — ACETAMINOPHEN 325 MG PO TABS: 650.0000 mg | ORAL_TABLET | Freq: Four times a day (QID) | ORAL | Status: DC | PRN

## 2017-03-06 MED ORDER — LIDOCAINE-PRILOCAINE 2.5-2.5 % EX CREA
TOPICAL_CREAM | CUTANEOUS | Status: AC
  Filled 2017-03-06: qty 5

## 2017-03-06 MED ORDER — ENOXAPARIN SODIUM 40 MG/0.4ML ~~LOC~~ SOLN: 40.0000 mg | SUBCUTANEOUS | Status: DC

## 2017-03-06 MED ORDER — ONDANSETRON HCL 4 MG/2ML IJ SOLN: 4.0000 mg | Freq: Four times a day (QID) | INTRAMUSCULAR | Status: DC | PRN

## 2017-03-06 MED ORDER — LIDOCAINE HCL (PF) 1 % IJ SOLN
INTRAMUSCULAR | Status: AC
  Filled 2017-03-06: qty 5

## 2017-03-06 MED ORDER — INSULIN ASPART 100 UNIT/ML ~~LOC~~ SOLN
3.0000 [IU] | Freq: Three times a day (TID) | SUBCUTANEOUS | Status: DC
  Administered 2017-03-06 – 2017-03-08 (×6): 3 [IU] via SUBCUTANEOUS
  Filled 2017-03-06 (×6): qty 1

## 2017-03-06 MED ORDER — LIDOCAINE HCL (PF) 1 % IJ SOLN
5.0000 mL | Freq: Once | INTRAMUSCULAR | Status: AC
  Administered 2017-03-06: 5 mL via INTRADERMAL

## 2017-03-06 MED ORDER — DOCUSATE SODIUM 100 MG PO CAPS
100.0000 mg | ORAL_CAPSULE | Freq: Two times a day (BID) | ORAL | Status: DC
  Filled 2017-03-06 (×2): qty 1

## 2017-03-06 MED ORDER — OXYCODONE HCL 5 MG PO TABS
5.0000 mg | ORAL_TABLET | ORAL | Status: DC | PRN
  Administered 2017-03-06 – 2017-03-07 (×5): 5 mg via ORAL
  Filled 2017-03-06 (×5): qty 1

## 2017-03-06 MED ORDER — MORPHINE SULFATE (PF) 4 MG/ML IV SOLN
4.0000 mg | Freq: Once | INTRAVENOUS | Status: AC
Start: 2017-03-06 — End: 2017-03-06
  Administered 2017-03-06: 4 mg via INTRAVENOUS
  Filled 2017-03-06: qty 1

## 2017-03-06 MED ORDER — SODIUM CHLORIDE 0.9 % IV BOLUS (SEPSIS)
1000.0000 mL | Freq: Once | INTRAVENOUS | Status: AC
  Administered 2017-03-06: 1000 mL via INTRAVENOUS

## 2017-03-06 MED ORDER — INSULIN ASPART 100 UNIT/ML ~~LOC~~ SOLN
8.0000 [IU] | Freq: Once | SUBCUTANEOUS | Status: AC
  Administered 2017-03-06: 8 [IU] via INTRAVENOUS
  Filled 2017-03-06: qty 1

## 2017-03-06 MED ORDER — MORPHINE SULFATE (PF) 2 MG/ML IV SOLN
2.0000 mg | INTRAVENOUS | Status: DC | PRN
  Administered 2017-03-06 – 2017-03-08 (×10): 2 mg via INTRAVENOUS
  Filled 2017-03-06 (×10): qty 1

## 2017-03-06 MED ORDER — INSULIN ASPART 100 UNIT/ML ~~LOC~~ SOLN: 0.0000 [IU] | Freq: Three times a day (TID) | SUBCUTANEOUS | Status: DC

## 2017-03-06 MED ORDER — VANCOMYCIN HCL IN DEXTROSE 1-5 GM/200ML-% IV SOLN
1000.0000 mg | Freq: Two times a day (BID) | INTRAVENOUS | Status: DC
  Administered 2017-03-06 – 2017-03-07 (×4): 1000 mg via INTRAVENOUS
  Filled 2017-03-06 (×5): qty 200

## 2017-03-06 MED ORDER — SODIUM CHLORIDE 0.9 % IV SOLN
INTRAVENOUS | Status: DC
  Administered 2017-03-06 – 2017-03-08 (×5): via INTRAVENOUS

## 2017-03-06 MED ORDER — IOPAMIDOL (ISOVUE-300) INJECTION 61%
75.0000 mL | Freq: Once | INTRAVENOUS | Status: AC | PRN
  Administered 2017-03-06: 75 mL via INTRAVENOUS

## 2017-03-06 MED ORDER — INSULIN GLARGINE 100 UNIT/ML ~~LOC~~ SOLN
10.0000 [IU] | Freq: Every day | SUBCUTANEOUS | Status: DC
  Administered 2017-03-06 – 2017-03-08 (×3): 10 [IU] via SUBCUTANEOUS
  Filled 2017-03-06 (×3): qty 0.1

## 2017-03-06 MED ORDER — ACETAMINOPHEN 650 MG RE SUPP: 650.0000 mg | Freq: Four times a day (QID) | RECTAL | Status: DC | PRN

## 2017-03-06 MED ORDER — VANCOMYCIN HCL IN DEXTROSE 1-5 GM/200ML-% IV SOLN
1000.0000 mg | Freq: Three times a day (TID) | INTRAVENOUS | Status: DC
  Filled 2017-03-06 (×2): qty 200

## 2017-03-06 MED ORDER — INSULIN ASPART 100 UNIT/ML ~~LOC~~ SOLN
0.0000 [IU] | Freq: Three times a day (TID) | SUBCUTANEOUS | Status: DC
  Administered 2017-03-06: 9 [IU] via SUBCUTANEOUS
  Administered 2017-03-06: 5 [IU] via SUBCUTANEOUS
  Administered 2017-03-07: 1 [IU] via SUBCUTANEOUS
  Administered 2017-03-07: 3 [IU] via SUBCUTANEOUS
  Administered 2017-03-07 – 2017-03-08 (×2): 7 [IU] via SUBCUTANEOUS
  Administered 2017-03-08: 5 [IU] via SUBCUTANEOUS
  Filled 2017-03-06 (×7): qty 1

## 2017-03-06 MED ORDER — ACETAMINOPHEN 500 MG PO TABS
1000.0000 mg | ORAL_TABLET | Freq: Once | ORAL | Status: AC
  Administered 2017-03-06: 1000 mg via ORAL
  Filled 2017-03-06: qty 2

## 2017-03-06 NOTE — Progress Notes (Signed)
Hypoglycemic Event  CBG: 57  Treatment: Apple juice   Symptoms: None  Follow-up CBG: Time: 1659 CBG Result: 67  Possible Reasons for Event: medication management no control yet   Comments/MD notified: yes     Francesco Sor

## 2017-03-06 NOTE — ED Notes (Signed)
Called report to Abby RN

## 2017-03-06 NOTE — ED Notes (Signed)
MD made aware of pt's suspected sepsis.

## 2017-03-06 NOTE — H&P (Signed)
Jonathan Mcmillan is an 21 y.o. male.   Chief Complaint: Facial swelling HPI: This is a 21 year old male with type 1 diabetes who presents to the emergency department due to fever and facial swelling. The patient has had a lesion on his face that was an ingrown hair that has become infected. He had been taking double strength Bactrim for 1 week without relief. He had seen a dermatologist for the infection and had a superficial debridement but the lesion persisted. He has developed subjective fevers but denies nausea, vomiting or diarrhea. His right eye is also now swollen but he denies pain or limitations of his extraocular movements. CT scan in the emergency department showed facial cellulitis with small abscess as well as myositis of the right masseter. ENT was consulted who agreed with IV antibiotics after which the emergency department staff called the hospitalist service for admission.  Past Medical History:  Diagnosis Date  . Diabetes 1.5, managed as type 1 [facility])     Past Surgical History:  Procedure Laterality Date  . none      Family History  Problem Relation Age of Onset  . Diabetes Mother    Social History:  reports that he has been smoking Cigarettes.  He has never used smokeless tobacco. He reports that he drinks alcohol. He reports that he uses drugs, including Marijuana.  Allergies: No Known Allergies   (Not in a hospital admission)  Results for orders placed or performed during the hospital encounter of 03/06/17 (from the past 48 hour(s))  CBC with Differential     Status: Abnormal   Collection Time: 03/06/17  2:40 AM  Result Value Ref Range   WBC 17.0 (H) 3.8 - 10.6 K/uL   RBC 4.81 4.40 - 5.90 MIL/uL   Hemoglobin 13.4 13.0 - 18.0 g/dL   HCT 41.3 40.0 - 52.0 %   MCV 85.9 80.0 - 100.0 fL   MCH 27.9 26.0 - 34.0 pg   MCHC 32.4 32.0 - 36.0 g/dL   RDW 13.4 11.5 - 14.5 %   Platelets 414 150 - 440 K/uL   Neutrophils Relative % 76 %   Neutro Abs 13.0 (H) 1.4 - 6.5 K/uL   Lymphocytes Relative 14 %   Lymphs Abs 2.3 1.0 - 3.6 K/uL   Monocytes Relative 7 %   Monocytes Absolute 1.2 (H) 0.2 - 1.0 K/uL   Eosinophils Relative 2 %   Eosinophils Absolute 0.3 0 - 0.7 K/uL   Basophils Relative 1 %   Basophils Absolute 0.2 (H) 0 - 0.1 K/uL  Comprehensive metabolic panel     Status: Abnormal   Collection Time: 03/06/17  2:40 AM  Result Value Ref Range   Sodium 130 (L) 135 - 145 mmol/L   Potassium 4.0 3.5 - 5.1 mmol/L   Chloride 92 (L) 101 - 111 mmol/L   CO2 27 22 - 32 mmol/L   Glucose, Bld 545 (HH) 65 - 99 mg/dL    Comment: CRITICAL RESULT CALLED TO, READ BACK BY AND VERIFIED WITH VANESSA ASHLEY AT [id number] 03/06/17 ALV    BUN 5 (L) 6 - 20 mg/dL   Creatinine, Ser 0.57 (L) 0.61 - 1.24 mg/dL   Calcium 9.2 8.9 - 10.3 mg/dL   Total Protein 8.1 6.5 - 8.1 g/dL   Albumin 3.4 (L) 3.5 - 5.0 g/dL   AST 15 15 - 41 U/L   ALT 11 (L) 17 - 63 U/L   Alkaline Phosphatase 166 (H) 38 - 126 U/L   Total  Bilirubin 0.7 0.3 - 1.2 mg/dL   GFR calc non Af Amer >60 >60 mL/min   GFR calc Af Amer >60 >60 mL/min    Comment: (NOTE) The eGFR has been calculated using the CKD EPI equation. This calculation has not been validated in all clinical situations. eGFR's persistently <60 mL/min signify possible Chronic Kidney Disease.    Anion gap 11 5 - 15  Urinalysis, Complete w Microscopic     Status: Abnormal   Collection Time: 03/06/17  2:40 AM  Result Value Ref Range   Color, Urine STRAW (A) YELLOW   APPearance CLEAR (A) CLEAR   Specific Gravity, Urine 1.029 1.005 - 1.030   pH 7.0 5.0 - 8.0   Glucose, UA >=500 (A) NEGATIVE mg/dL   Hgb urine dipstick SMALL (A) NEGATIVE   Bilirubin Urine NEGATIVE NEGATIVE   Ketones, ur NEGATIVE NEGATIVE mg/dL   Protein, ur NEGATIVE NEGATIVE mg/dL   Nitrite NEGATIVE NEGATIVE   Leukocytes, UA NEGATIVE NEGATIVE   RBC / HPF 0-5 0 - 5 RBC/hpf   WBC, UA 0-5 0 - 5 WBC/hpf   Bacteria, UA RARE (A) NONE SEEN   Squamous Epithelial / LPF NONE SEEN NONE SEEN    Glucose, capillary     Status: Abnormal   Collection Time: 03/06/17  3:07 AM  Result Value Ref Range   Glucose-Capillary 558 (HH) 65 - 99 mg/dL   Comment 1 Notify RN   Lactic acid, plasma     Status: None   Collection Time: 03/06/17  3:35 AM  Result Value Ref Range   Lactic Acid, Venous 1.9 0.5 - 1.9 mmol/L  Culture, blood (Routine x 2)     Status: None (Preliminary result)   Collection Time: 03/06/17  3:35 AM  Result Value Ref Range   Specimen Description BLOOD RT FOREARM    Special Requests      BOTTLES DRAWN AEROBIC AND ANAEROBIC Blood Culture adequate volume   Culture NO GROWTH < 12 HOURS    Report Status PENDING   Culture, blood (Routine x 2)     Status: None (Preliminary result)   Collection Time: 03/06/17  3:36 AM  Result Value Ref Range   Specimen Description BLOOD LT FOREARM    Special Requests      BOTTLES DRAWN AEROBIC AND ANAEROBIC Blood Culture adequate volume   Culture NO GROWTH < 12 HOURS    Report Status PENDING    Ct Maxillofacial W Contrast  Result Date: 03/06/2017 CLINICAL DATA:  RIGHT facial swelling and abscess for 1 week, on antibiotics. History of diabetes. EXAM: CT MAXILLOFACIAL WITH CONTRAST TECHNIQUE: Multidetector CT imaging of the maxillofacial structures was performed with intravenous contrast. Multiplanar CT image reconstructions were also generated. CONTRAST:  60m ISOVUE-300 IOPAMIDOL (ISOVUE-300) INJECTION 61% COMPARISON:  CT neck November 23, 2016 FINDINGS: OSSEOUS: The mandible is intact, the condyles are located. No acute facial fracture. No destructive bony lesions. Multiple dental carie and scattered periapical abscess. ORBITS: Ocular globes and orbital contents are normal. SINUSES: Subcentimeter LEFT maxillary mucosal retention cysts without paranasal sinus air-fluid levels. Mastoid air cells are well aerated. Nasal septum is midline. SOFT TISSUES: RIGHT facial soft tissue swelling, skin thickening with subcutaneous fat stranding. Superimposed  irregular 2.4 x 1.5 x 3.1 cm (AP by transverse by CC) abscess RIGHT superficial face. Mildly enlarged RIGHT masseter muscle. Slight RIGHT parotid ductal dilatation compatible with reactive changes. LIMITED INTRACRANIAL: Normal. IMPRESSION: 1. RIGHT facial cellulitis, 2.4 x 1.5 x 3.1 cm facial abscess. RIGHT masseter myositis. 2.  Poor dentition. Electronically Signed   By: Elon Alas M.D.   On: 03/06/2017 04:40    Review of Systems  Constitutional: Positive for fever (subjective). Negative for chills.  HENT: Negative for sore throat and tinnitus.   Eyes: Negative for blurred vision and redness.  Respiratory: Negative for cough and shortness of breath.   Cardiovascular: Negative for chest pain, palpitations, orthopnea and PND.  Gastrointestinal: Negative for abdominal pain, diarrhea, nausea and vomiting.  Genitourinary: Negative for dysuria, frequency and urgency.  Musculoskeletal: Negative for joint pain and myalgias.  Skin: Negative for rash.       No lesions  Neurological: Negative for speech change, focal weakness and weakness.  Endo/Heme/Allergies: Does not bruise/bleed easily.       No temperature intolerance  Psychiatric/Behavioral: Negative for depression and suicidal ideas.    Blood pressure 131/86, pulse (!) 101, temperature 99.3 F (37.4 C), temperature source Oral, resp. rate 18, height 5' 9"  (1.753 m), weight 53.5 kg (118 lb), SpO2 100 %. Physical Exam  Vitals reviewed. Constitutional: He is oriented to person, place, and time. He appears well-developed and well-nourished. No distress.  HENT:  Head: Normocephalic and atraumatic.    Mouth/Throat: Oropharynx is clear and moist.  Area of cellulitis involving eyelid  Eyes: Pupils are equal, round, and reactive to light. Conjunctivae and EOM are normal. No scleral icterus.  Neck: Normal range of motion. Neck supple. No JVD present. No tracheal deviation present. No thyromegaly present.  Cardiovascular: Normal rate,  regular rhythm and normal heart sounds.  Exam reveals no gallop and no friction rub.   No murmur heard. Respiratory: Effort normal and breath sounds normal. No respiratory distress.  GI: Soft. Bowel sounds are normal. He exhibits no distension. There is no tenderness.  Genitourinary:  Genitourinary Comments: Deferred  Musculoskeletal: Normal range of motion. He exhibits no edema.  Lymphadenopathy:    He has no cervical adenopathy.  Neurological: He is alert and oriented to person, place, and time. No cranial nerve deficit.  Skin: Skin is warm and dry. No rash noted. No erythema.  Psychiatric: He has a normal mood and affect. His behavior is normal. Judgment and thought content normal.     Assessment/Plan This is a 21 year old male admitted for sepsis. 1. Sepsis: The patient meets criteria via leukocytosis, tachycardia and tachypnea. He's also had subjective fevers. He is hemodynamically stable. Blood cultures obtained in the emergency department. I'll growth and sensitivities. 2. Cellulitis of the face: Including periorbital area; no evidence of post-septal involvement. Vancomycin per pharmacy dosing. Consult ENT for further guidance. 3. Diabetes mellitus type 1: With hyperglycemia; continue basal insulin therapy as well as sliding scale hospitalized. Hydrate with intravenous fluid. Encourage Carbatrol diet. 4. Underweight: The patient's BMI is 17; encourage healthy diet and increase protein. 5. DVT prophylaxis: Lovenox 6. GI prophylaxis: None The patient is a full code. Time spent on admission was inpatient care possibly 45 minutes  Harrie Foreman, MD 03/06/2017, 7:59 AM

## 2017-03-06 NOTE — ED Notes (Signed)
abscess drained, cavity filled with Iodoform packing strip.

## 2017-03-06 NOTE — Progress Notes (Signed)
Notified Dr. Margaretmary Eddy that patient had blood glucose of 433. Covered patient at max 400 coverage, with meal coverage, and lantus. Per MD give Normal saline bolus 1,000 ml over one hour and then recheck sugar.

## 2017-03-06 NOTE — Care Management (Signed)
Patient listed as self pay.  [facility] was [facility] in January of 2018.  RNCM contacted pharmacist at [facility]  Who states that patient only picked is insulin up that one time.  She states that if patient does need assistance at this discharge they can provided it.  [facility] is listed as PCP.  RNCM contacted [facility] that states the patient has novolog ready for pick up and out of pocket cost would be $10, however patient has no picked up insulin from their pharmacy since 08/10/13 .  PCP is Posey Pronto and last visit was 03/04/17.  RNCM following for discharge planning needs

## 2017-03-06 NOTE — ED Provider Notes (Signed)
[facility] Emergency Department Provider Note   ____________________________________________   First MD Initiated Contact with Patient 03/06/17 [phone]     (approximate)  I have reviewed the triage vital signs and the nursing notes.   HISTORY  Chief Complaint Facial Swelling    HPI Jonathan Mcmillan is a 21 y.o. male who comes into the hospital today with a right facial abscess. The patient reports that he started having some right facial swelling proximally 4-5 days ago. He noted that he had a ingrown hair and thought it might become an abscess. He went to see Dr. Veda Canning about 2 days ago and was given doxycycline. He reports that they also used a needle to drain the abscess but told him that should his face got more swollen he should come into the hospital to have it lanced. The patient denies any fevers but tonight the face was sore and he rates his pain a 10 out of 10 in intensity. He's had no nausea no vomiting but his face was significantly more swollen. Mom states that she saw swelling to his eye which is when she decided to come into the hospital. The patient is here for evaluation of his right facial abscess.   Past Medical History:  Diagnosis Date  . Diabetes 1.5, managed as type 1 [facility])     Patient Active Problem List   Diagnosis Date Noted  . Sepsis ([place]) 03/06/2017  . Malnutrition of moderate degree 05/22/2016  . DKA (diabetic ketoacidoses) ([place]) 05/21/2016  . Type I (juvenile type) diabetes mellitus without mention of complication, uncontrolled 10/05/2010  . Hypertension 10/05/2010  . Obesity 10/05/2010    Past Surgical History:  Procedure Laterality Date  . none      Prior to Admission medications   Medication Sig Start Date End Date Taking? Authorizing Provider  doxycycline (VIBRA-TABS) 100 MG tablet Take 100 mg by mouth 2 (two) times daily.   Yes [provider]  insulin aspart protamine- aspart (NOVOLOG MIX 70/30)  (70-30) 100 UNIT/ML injection Inject 0.13 mLs (13 Units total) into the skin 2 (two) times daily before a meal. Patient taking differently: Inject 20 Units into the skin 2 (two) times daily before a meal.  02/03/17  Yes Nance Pear, MD    Allergies Patient has no known allergies.  Family History  Problem Relation Age of Onset  . Diabetes Mother     Social History Social History  Substance Use Topics  . Smoking status: Current Every Day Smoker    Types: Cigarettes  . Smokeless tobacco: Never Used  . Alcohol use Yes    Review of Systems  Constitutional: No fever/chills Eyes: No visual changes. ENT: right facial swelling Cardiovascular: Denies chest pain. Respiratory: Denies shortness of breath. Gastrointestinal: No abdominal pain.  No nausea, no vomiting.  No diarrhea.  No constipation. Genitourinary: Negative for dysuria. Musculoskeletal: Negative for back pain. Skin: right facial abscess Neurological: Negative for headaches, focal weakness or numbness.   ____________________________________________   PHYSICAL EXAM:  VITAL SIGNS: ED Triage Vitals  Enc Vitals Group     BP 03/06/17 0300 (!) 145/96     Pulse Rate 03/06/17 0230 (!) 128     Resp 03/06/17 0230 18     Temp 03/06/17 0230 98.2 F (36.8 C)     Temp Source 03/06/17 0230 Oral     SpO2 03/06/17 0230 100 %     Weight 03/06/17 0231 118 lb (53.5 kg)     Height  03/06/17 0231 5\' 9"  (1.753 m)     Head Circumference --      Peak Flow --      Pain Score 03/06/17 0229 10     Pain Loc --      Pain Edu? --      Excl. in [place]? --     Constitutional: Alert and oriented. Well appearing and in moderate distress. Eyes: Conjunctivae are normal. PERRL. EOMI. Right periorbital edema Head: right facial swelling with induration but without distinct fluctuance. Area or previous needle aspiration noted Nose: No congestion/rhinnorhea. Mouth/Throat: Mucous membranes are moist.  Oropharynx non-erythematous. Cardiovascular:  Tachycardia, regular rhythm. Grossly normal heart sounds.  Good peripheral circulation. Respiratory: Normal respiratory effort.  No retractions. Lungs CTAB. Gastrointestinal: Soft and nontender. No distention. Positive bowel sounds Musculoskeletal: No lower extremity tenderness nor edema.   Neurologic:  Normal speech and language. Skin:  Skin is warm, dry and intact.  Psychiatric: Mood and affect are normal.   ____________________________________________   LABS (all labs ordered are listed, but only abnormal results are displayed)  Labs Reviewed  CBC WITH DIFFERENTIAL/PLATELET - Abnormal; Notable for the following:       Result Value   WBC 17.0 (*)    Neutro Abs 13.0 (*)    Monocytes Absolute 1.2 (*)    Basophils Absolute 0.2 (*)    All other components within normal limits  COMPREHENSIVE METABOLIC PANEL - Abnormal; Notable for the following:    Sodium 130 (*)    Chloride 92 (*)    Glucose, Bld 545 (*)    BUN 5 (*)    Creatinine, Ser 0.57 (*)    Albumin 3.4 (*)    ALT 11 (*)    Alkaline Phosphatase 166 (*)    All other components within normal limits  URINALYSIS, COMPLETE (UACMP) WITH MICROSCOPIC - Abnormal; Notable for the following:    Color, Urine STRAW (*)    APPearance CLEAR (*)    Glucose, UA >=500 (*)    Hgb urine dipstick SMALL (*)    Bacteria, UA RARE (*)    All other components within normal limits  GLUCOSE, CAPILLARY - Abnormal; Notable for the following:    Glucose-Capillary 558 (*)    All other components within normal limits  CULTURE, BLOOD (ROUTINE X 2)  CULTURE, BLOOD (ROUTINE X 2)  AEROBIC CULTURE (SUPERFICIAL SPECIMEN)  LACTIC ACID, PLASMA   ____________________________________________  EKG  none ____________________________________________  RADIOLOGY  Ct Maxillofacial W Contrast  Result Date: 03/06/2017 CLINICAL DATA:  RIGHT facial swelling and abscess for 1 week, on antibiotics. History of diabetes. EXAM: CT MAXILLOFACIAL WITH CONTRAST  TECHNIQUE: Multidetector CT imaging of the maxillofacial structures was performed with intravenous contrast. Multiplanar CT image reconstructions were also generated. CONTRAST:  49mL ISOVUE-300 IOPAMIDOL (ISOVUE-300) INJECTION 61% COMPARISON:  CT neck November 23, 2016 FINDINGS: OSSEOUS: The mandible is intact, the condyles are located. No acute facial fracture. No destructive bony lesions. Multiple dental carie and scattered periapical abscess. ORBITS: Ocular globes and orbital contents are normal. SINUSES: Subcentimeter LEFT maxillary mucosal retention cysts without paranasal sinus air-fluid levels. Mastoid air cells are well aerated. Nasal septum is midline. SOFT TISSUES: RIGHT facial soft tissue swelling, skin thickening with subcutaneous fat stranding. Superimposed irregular 2.4 x 1.5 x 3.1 cm (AP by transverse by CC) abscess RIGHT superficial face. Mildly enlarged RIGHT masseter muscle. Slight RIGHT parotid ductal dilatation compatible with reactive changes. LIMITED INTRACRANIAL: Normal. IMPRESSION: 1. RIGHT facial cellulitis, 2.4 x 1.5 x 3.1 cm facial  abscess. RIGHT masseter myositis. 2. Poor dentition. Electronically Signed   By: Elon Alas M.D.   On: 03/06/2017 04:40    ____________________________________________   PROCEDURES  Procedure(s) performed: please, see procedure note(s).  [name].Incision and Drainage Date/Time: 03/06/2017 7:30 AM Performed by: Loney Hering Authorized by: Loney Hering   Consent:    Consent obtained:  Verbal   Consent given by:  Patient Location:    Type:  Abscess   Size:  3.1x2.4x1.5   Location:  Head   Head location:  Face Pre-procedure details:    Skin preparation:  Betadine Anesthesia (see MAR for exact dosages):    Anesthesia method:  Topical application   Topical anesthetic:  EMLA cream Procedure type:    Complexity:  Complex Procedure details:    Incision types:  Stab incision   Incision depth:  Subcutaneous   Scalpel blade:  11    Wound management:  Probed and deloculated and irrigated with saline   Drainage:  Purulent   Drainage amount:  Moderate   Packing materials:  1/4 in gauze   Amount 1/4":  2 inches Post-procedure details:    Patient tolerance of procedure:  Tolerated well, no immediate complications    Critical Care performed: Yes, see critical care note(s)   CRITICAL CARE Performed by: Charlesetta Ivory P   Total critical care time: 45 minutes  Critical care time was exclusive of separately billable procedures and treating other patients.  Critical care was necessary to treat or prevent imminent or life-threatening deterioration.  Critical care was time spent personally by me on the following activities: development of treatment plan with patient and/or surrogate as well as nursing, discussions with consultants, evaluation of patient's response to treatment, examination of patient, obtaining history from patient or surrogate, ordering and performing treatments and interventions, ordering and review of laboratory studies, ordering and review of radiographic studies, pulse oximetry and re-evaluation of patient's condition.,  ____________________________________________   INITIAL IMPRESSION / ASSESSMENT AND PLAN / ED COURSE  As part of my medical decision making, I reviewed the following data within the electronic MEDICAL RECORD NUMBER Notes from prior ED visits and [place] Controlled Substance Database   This is a 21 year old male who comes into the hospital today with a facial abscess. The patient has been on antibiotics for 2 days and has had needle aspiration 2 days ago.  My differential diagnosis includes deep space abscess, cellulitis.  We did check the blood work and the patient does have a white blood cell count of 17,000. patient is diabetic and also has a blood glucose in the 500s. His anion gap is 11 so he is not in DKA. the patient was tachycardic into the 120s when he arrived but he was afebrile.  We did give the patient some normal saline when he initially arrived to help control his tachycardia. I also gave the patient a dose of vancomycin is a concern for a staph infection to his face. I sent the patient for a CT scan to evaluate the depth of his abscess and extent to which it spreads. The patient's CT scan returned stating that he had some facial cellulitis with some masseter muscle myositis and a 3 x 2 x 1 cm superficial abscess. I did contact Dr. Tami Ribas from otolaryngology and he did recommend doing an I&D of abscess since it was described as superficial. I was able to I&D the abscess and obtain some moderate purulent drainage. Dr. Tami Ribas did recommend doing a culture which  was sent to determine the bacteria causing this abscess. The patient did receive a second liter of normal saline and 2 doses of morphine. He will be admitted to the hospitalist service for further evaluation of his hyperglycemia, facial cellulitis and abscess. patient will also have a units of insulin as he did eat a biscuit while he was here.      ____________________________________________   FINAL CLINICAL IMPRESSION(S) / ED DIAGNOSES  Final diagnoses:  Facial cellulitis  Infective myositis of other site  Abscess      NEW MEDICATIONS STARTED DURING THIS VISIT:  New Prescriptions   No medications on file     Note:  This document was prepared using Dragon voice recognition software and may include unintentional dictation errors.    Loney Hering, MD 03/06/17 [phone]

## 2017-03-06 NOTE — Progress Notes (Signed)
[place] [facility] [place] NAME: Jonathan Mcmillan    MR#:  [id number]  DATE OF BIRTH:  05/08/1996  SUBJECTIVE:  CHIEF COMPLAINT:  Rt face and rt eyelid are swollen  REVIEW OF SYSTEMS:  CONSTITUTIONAL: reports fever, fatigue  EYES: No blurred or double vision.  EARS, NOSE, AND THROAT:  Rt face and eye lid swollen No tinnitus or ear pain.  RESPIRATORY: No cough, shortness of breath, wheezing or hemoptysis.  CARDIOVASCULAR: No chest pain, orthopnea, edema.  GASTROINTESTINAL: No nausea, vomiting, diarrhea or abdominal pain.  GENITOURINARY: No dysuria, hematuria.  ENDOCRINE: No polyuria, nocturia,  HEMATOLOGY: No anemia, easy bruising or bleeding SKIN: No rash or lesion. MUSCULOSKELETAL: No joint pain or arthritis.   NEUROLOGIC: No tingling, numbness, weakness.  PSYCHIATRY: No anxiety or depression.   DRUG ALLERGIES:  No Known Allergies  VITALS:  Blood pressure (!) 153/92, pulse (!) 116, temperature 98.2 F (36.8 C), temperature source Oral, resp. rate 20, height 5\' 9"  (1.753 m), weight 53.5 kg (118 lb), SpO2 100 %.  PHYSICAL EXAMINATION:  GENERAL:  21 y.o.-year-old patient lying in the bed with no acute distress.  EYES: Pupils equal, round, reactive to light and accommodation. No scleral icterus. Extraocular muscles intact.  HEENT: Head atraumatic, normocephalic. Oropharynx couldn't examine as pt couldn't open mouth completely. Rt facial edema and tender  NECK:  Supple, no jugular venous distention. No thyroid enlargement, no tenderness.  LUNGS: Normal breath sounds bilaterally, no wheezing, rales,rhonchi or crepitation. No use of accessory muscles of respiration.  CARDIOVASCULAR: S1, S2 normal. No murmurs, rubs, or gallops.  ABDOMEN: Soft, nontender, nondistended. Bowel sounds present. No organomegaly or mass.  EXTREMITIES: No pedal edema, cyanosis, or clubbing.  NEUROLOGIC: Cranial nerves II through XII are intact. Muscle strength 5/5  in all extremities. Sensation intact. Gait not checked.  PSYCHIATRIC: The patient is alert and oriented x 3.  SKIN: No obvious rash, lesion, or ulcer.    LABORATORY PANEL:   CBC  Recent Labs Lab 03/06/17 0240  WBC 17.0*  HGB 13.4  HCT 41.3  PLT 414   ------------------------------------------------------------------------------------------------------------------  Chemistries   Recent Labs Lab 03/06/17 0240  NA 130*  K 4.0  CL 92*  CO2 27  GLUCOSE 545*  BUN 5*  CREATININE 0.57*  CALCIUM 9.2  AST 15  ALT 11*  ALKPHOS 166*  BILITOT 0.7   ------------------------------------------------------------------------------------------------------------------  Cardiac Enzymes No results for input(s): TROPONINI in the last 168 hours. ------------------------------------------------------------------------------------------------------------------  RADIOLOGY:  Ct Maxillofacial W Contrast  Result Date: 03/06/2017 CLINICAL DATA:  RIGHT facial swelling and abscess for 1 week, on antibiotics. History of diabetes. EXAM: CT MAXILLOFACIAL WITH CONTRAST TECHNIQUE: Multidetector CT imaging of the maxillofacial structures was performed with intravenous contrast. Multiplanar CT image reconstructions were also generated. CONTRAST:  54mL ISOVUE-300 IOPAMIDOL (ISOVUE-300) INJECTION 61% COMPARISON:  CT neck November 23, 2016 FINDINGS: OSSEOUS: The mandible is intact, the condyles are located. No acute facial fracture. No destructive bony lesions. Multiple dental carie and scattered periapical abscess. ORBITS: Ocular globes and orbital contents are normal. SINUSES: Subcentimeter LEFT maxillary mucosal retention cysts without paranasal sinus air-fluid levels. Mastoid air cells are well aerated. Nasal septum is midline. SOFT TISSUES: RIGHT facial soft tissue swelling, skin thickening with subcutaneous fat stranding. Superimposed irregular 2.4 x 1.5 x 3.1 cm (AP by transverse by CC) abscess RIGHT  superficial face. Mildly enlarged RIGHT masseter muscle. Slight RIGHT parotid ductal dilatation compatible with reactive changes. LIMITED INTRACRANIAL: Normal. IMPRESSION: 1. RIGHT facial  cellulitis, 2.4 x 1.5 x 3.1 cm facial abscess. RIGHT masseter myositis. 2. Poor dentition. Electronically Signed   By: Elon Alas M.D.   On: 03/06/2017 04:40    EKG:   Orders placed or performed during the hospital encounter of 05/21/16  . EKG 12-Lead  . EKG 12-Lead  . EKG    ASSESSMENT AND PLAN:   This is a 21 year old male admitted for sepsis. 1. Sepsis: The patient meets criteria with  leukocytosis, tachycardia and tachypnea. Follow Blood cultures obtained in the emergency department.   2. Cellulitis of the face: Including periorbital area; no evidence of post-septal involvement. Vancomycin per pharmacy dosing.  S/p drainage ; f/u with ENT for further guidance.  3. Diabetes mellitus type 1: With hypoglycemia;  continue basal insulin therapy as well as sliding scale hospitalized. Hydrate with intravenous fluid. Encourage Carbatrol diet.  4. Underweight: The patient's BMI is 17; encourage healthy diet and increase protein.  5. DVT prophylaxis: Lovenox     All the records are reviewed and case discussed with Care Management/Social Workerr. Management plans discussed with the patient, family and they are in agreement.  CODE STATUS: fc  TOTAL TIME TAKING CARE OF THIS PATIENT: 34 minutes.   POSSIBLE D/C IN 2 DAYS, DEPENDING ON CLINICAL CONDITION.  Note: This dictation was prepared with Dragon dictation along with smaller phrase technology. Any transcriptional errors that result from this process are unintentional.   Nicholes Mango M.D on 03/06/2017 at 7:45 PM  Between 7am to 6pm - Pager - [phone] After 6pm go to [email or web address] - password [email or web address] [place] [facility]  Office  [phone]  CC: Primary care physician; Center, [place]

## 2017-03-06 NOTE — ED Triage Notes (Signed)
Patient to ER for c/o facial swelling to right face. States he noticed an abscess approx 1 week ago. Saw PCP and was started on antibiotics a couple of days ago (also started back on insulin). No patient has significant swelling to right face and around right orbit.

## 2017-03-06 NOTE — Progress Notes (Signed)
ANTIBIOTIC CONSULT NOTE - INITIAL  Pharmacy Consult for vancomycin Indication: sepsis and cellulitis  No Known Allergies  Patient Measurements: Height: 5\' 9"  (175.3 cm) Weight: 118 lb (53.5 kg) IBW/kg (Calculated) : 70.7   Ke: 0.094 T1/2 7.37hrs Dosing weight 53.5kg  Vancomycin target trough 15-20  Vital Signs: Temp: 98 F (36.7 C) (10/24 0954) Temp Source: Oral (10/24 0954) BP: 129/79 (10/24 0954) Pulse Rate: 95 (10/24 0954) Intake/Output from previous day: No intake/output data recorded. Intake/Output from this shift: Total I/O In: -  Out: 500 [Urine:500]  Labs:  Recent Labs  03/06/17 0240  WBC 17.0*  HGB 13.4  PLT 414  CREATININE 0.57*   Estimated Creatinine Clearance: 108.7 mL/min (A) (by C-G formula based on SCr of 0.57 mg/dL (L)). No results for input(s): VANCOTROUGH, VANCOPEAK, VANCORANDOM, GENTTROUGH, GENTPEAK, GENTRANDOM, TOBRATROUGH, TOBRAPEAK, TOBRARND, AMIKACINPEAK, AMIKACINTROU, AMIKACIN in the last 72 hours.   Microbiology: Recent Results (from the past 720 hour(s))  Culture, blood (Routine x 2)     Status: None (Preliminary result)   Collection Time: 03/06/17  3:35 AM  Result Value Ref Range Status   Specimen Description BLOOD RT FOREARM  Final   Special Requests   Final    BOTTLES DRAWN AEROBIC AND ANAEROBIC Blood Culture adequate volume   Culture NO GROWTH < 12 HOURS  Final   Report Status PENDING  Incomplete  Culture, blood (Routine x 2)     Status: None (Preliminary result)   Collection Time: 03/06/17  3:36 AM  Result Value Ref Range Status   Specimen Description BLOOD LT FOREARM  Final   Special Requests   Final    BOTTLES DRAWN AEROBIC AND ANAEROBIC Blood Culture adequate volume   Culture NO GROWTH < 12 HOURS  Final   Report Status PENDING  Incomplete    Medical History: Past Medical History:  Diagnosis Date  . Diabetes 1.5, managed as type 1 ([place])    Estimated Creatinine Clearance: 108.7 mL/min (A) (by C-G formula based on SCr  of 0.57 mg/dL (L)).  Medications:  Prescriptions Prior to Admission  Medication Sig Dispense Refill Last Dose  . doxycycline (VIBRA-TABS) 100 MG tablet Take 100 mg by mouth 2 (two) times daily.   03/05/2017 at Unknown time  . insulin aspart protamine- aspart (NOVOLOG MIX 70/30) (70-30) 100 UNIT/ML injection Inject 0.13 mLs (13 Units total) into the skin 2 (two) times daily before a meal. (Patient taking differently: Inject 20 Units into the skin 2 (two) times daily before a meal. ) 10 mL 11 03/05/2017 at Unknown time   Scheduled:  . docusate sodium  100 mg Oral BID  . enoxaparin (LOVENOX) injection  40 mg Subcutaneous Q24H  . insulin aspart  0-5 Units Subcutaneous QHS  . insulin aspart  0-9 Units Subcutaneous TID WC  . insulin aspart  0-9 Units Subcutaneous TID WC  . insulin aspart  3 Units Subcutaneous TID WC  . insulin glargine  10 Units Subcutaneous Daily   Infusions:  . sodium chloride    . vancomycin     PRN: acetaminophen **OR** acetaminophen, ondansetron **OR** ondansetron (ZOFRAN) IV Anti-infectives    Start     Dose/Rate Route Frequency Ordered Stop   03/06/17 1100  vancomycin (VANCOCIN) IVPB 1000 mg/200 mL premix  Status:  Discontinued     1,000 mg 200 mL/hr over 60 Minutes Intravenous Every 8 hours 03/06/17 1021 03/06/17 1027   03/06/17 1100  vancomycin (VANCOCIN) IVPB 1000 mg/200 mL premix     1,000 mg 200  mL/hr over 60 Minutes Intravenous Every 12 hours 03/06/17 1027     03/06/17 0345  vancomycin (VANCOCIN) IVPB 1000 mg/200 mL premix     1,000 mg 200 mL/hr over 60 Minutes Intravenous  Once 03/06/17 0332 03/06/17 0445     Assessment: 21 year old male with face abscess cellulitis and also sepsis. Received vancomycin 1gm 10/24am, will begin on vancomycin 1gm iv q12h.   Goal of Therapy:  Vancomycin trough level 15-20 mcg/ml  Plan:  Expected duration 10 days with resolution of temperature and/or normalization of WBC  Follow up cultures Order vancomycin trough  before 4th dose of vancomycin 1gm iv q12h, 10/25@2230   Donna Christen Leianne Callins 03/06/2017,10:26 AM

## 2017-03-06 NOTE — Progress Notes (Signed)
Hypoglycemic Event  CBG: 67  Treatment: Apple juice   Symptoms: none   Follow-up CBG: Time:1726 CBG Result:164  Possible Reasons for Event: medication management no control yet   Comments/MD notified: yes    Francesco Sor

## 2017-03-06 NOTE — ED Notes (Signed)
ED Provider at bedside. 
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
## 2017-03-06 NOTE — Progress Notes (Addendum)
Inpatient Diabetes Program Recommendations  AACE/ADA: New Consensus Statement on Inpatient Glycemic Control (2015)  Target Ranges:  Prepandial:   less than 140 mg/dL      Peak postprandial:   less than 180 mg/dL (1-2 hours)      Critically ill patients:  140 - 180 mg/dL  Results for ROLDAN, LAFOREST (MRN [id number]) as of 03/06/2017 08:59  Ref. Range 03/06/2017 03:07  Glucose-Capillary Latest Ref Range: 65 - 99 mg/dL [facility])   Results for YUL, DIANA (MRN [id number]) as of 03/06/2017 08:59  Ref. Range 03/06/2017 02:40  Glucose Latest Ref Range: 65 - 99 mg/dL [facility])   Results for CHESLEY, VEASEY (MRN [id number]) as of 03/06/2017 08:59  Ref. Range 08/05/2013 13:26  Hemoglobin A1C Latest Ref Range: 4.2 - 6.3 % 12.9 (H)   Review of Glycemic Control  Diabetes history: DM 1.5 (treated as Type 1) Outpatient Diabetes medications: 70/30 20 units BID Current orders for Inpatient glycemic control: Lantus 20 units QHS, Novolog 0-9 units TID with meals (orders not released yet)  Inpatient Diabetes Program Recommendations: Insulin - Basal: Initial glucose 545 mg/dl on labs but not acidotic (CO2 27 and AG 11). Note order for Lantus 20 units QHS. During last admission in Jan 2018 patient was ordered Lantus 10 units BID and experirenced hypoglycemia. Please discontinue Lantus 20 units QHS and order Lantus 10 units Q24H (start now). Correction (SSI): Please consider ordering Novolog 0-5 units QHS for bedtime correction scale. HgbA1C: Please order an A1C (add to blood in lab) to evaluate glycemic control over the past 2-3 months.  Novolog-Meal Coverage: If patient is eating, please consider ordering Novolog 3 units TID with meals for meal coverage if patient eats at least 50% of meals.  NOTE: In reviewing chart, noted patient was inpatient from 05/21/16 to 05/24/16 and patient was seen twice by Diabetes Coordinator during admission and it was stressed to patient that he needed to take insulin. Patient  had stated he would take a pill for DM but did not want to take insulin. After further discussion with patient he had stated he would take insulin. Patient was discharged on 05/24/16 on 70/30 13 units BID and was suppose to [facility] and follow up at [facility]. Also noted patient was in the Emergency Room on 02/03/17 with thigh abscess and hyperglycemia at which time patient reported not taking any insulin in months.  Addendum 03/06/17@13 :15-Talked with patient regarding DM control. Patient states that he has not taken insulin in months. Inquired about why and patient stated "I am depressed and I just feel like taking care of myself."  Inquired about whether he has talked with a doctor about feeling depressed and patient states that he just saw Dr. Posey Pronto at the [facility] on 03/04/17 and he was started on an oral medication for depression so he had only took one pill before being admitted to the hospital. Discussed how depression can impact ability and motivation for self management of DM. Informed patient that it may take 3-4 weeks before he feels the medication for depression is working. Encouraged patient to continue to take medication and provided emotional support. Patient states that he has insulin at home. Patient appears malnourished and cachetic. Inquired if patient has lost more weight since he was inpatient in January 2018 and patient reports that he has lost more weight. Discussed how the body needs insulin in order to use glucose and how the cells in the body  are not able to use the glucose in the blood stream without insulin so the body is breaking down muscle and fat tissue trying to get glucose to function. Explained that he really needs to take the insulin in order for his body to use the glucose. Talked with patient about facial abscess and that it was noted he was in the ER on 02/03/17 with hyperglycemia and a thigh abscess. Explained how bacteria  spreads and infections get worse with uncontrolled DM. Stressed to patient that he is very young and that he is at increased risk of experiencing complications from uncontrolled DM such as losing his vision, on dialysis, losing limbs, having a heart attack or stroke. Encouraged patient to start checking glucose and taking insulin and to follow up at the [facility] to improve DM control. Patient verbalized understanding of information discussed and he states he has no questions at this time.   Thanks, Barnie Alderman, RN, MSN, CDE Diabetes Coordinator Inpatient Diabetes Program [phone] (Team Pager from 8am to 5pm)

## 2017-03-07 LAB — GLUCOSE, CAPILLARY
Glucose-Capillary: 220 mg/dL — ABNORMAL HIGH (ref 65–99)
Glucose-Capillary: 313 mg/dL — ABNORMAL HIGH (ref 65–99)
Glucose-Capillary: 151 mg/dL — ABNORMAL HIGH (ref 65–99)
Glucose-Capillary: 296 mg/dL — ABNORMAL HIGH (ref 65–99)

## 2017-03-07 LAB — HEMOGLOBIN A1C
Hgb A1c MFr Bld: 13.9 % — ABNORMAL HIGH (ref 4.8–5.6)
MEAN PLASMA GLUCOSE: 352.23 mg/dL

## 2017-03-07 LAB — VANCOMYCIN, TROUGH: VANCOMYCIN TR: 4 ug/mL — AB (ref 15–20)

## 2017-03-07 MED ORDER — ADULT MULTIVITAMIN W/MINERALS CH
1.0000 | ORAL_TABLET | Freq: Every day | ORAL | Status: DC
  Filled 2017-03-07: qty 1

## 2017-03-07 NOTE — Progress Notes (Addendum)
Inpatient Diabetes Program Recommendations  AACE/ADA: New Consensus Statement on Inpatient Glycemic Control (2015)  Target Ranges:  Prepandial:   less than 140 mg/dL      Peak postprandial:   less than 180 mg/dL (1-2 hours)      Critically ill patients:  140 - 180 mg/dL   Lab Results  Component Value Date   GLUCAP 220 (H) 03/07/2017   HGBA1C 14.1 (H) 03/06/2017     Review of Glycemic Control  Results for USHER, HEDBERG (MRN [id number]) as of 03/07/2017 08:47  Ref. Range 03/06/2017 16:33 03/06/2017 16:59 03/06/2017 17:26 03/06/2017 21:47 03/07/2017 07:46  Glucose-Capillary Latest Ref Range: 65 - 99 mg/dL 57 (L) 67 164 (H) 247 (H) 220 (H)    Diabetes history: DM 1.5 (treated as Type 1) Outpatient Diabetes medications: 70/30 20 units BID- ordered- not taken in months Current orders for Inpatient glycemic control: 70/30 10 units qday, Novolog 3 units tid, Novolog 0-9 units tid, Novolog 0-5 units qhs  Inpatient Diabetes Program Recommendations: low blood sugar yesterday because Novolog was given too closely together (@ 10:47am and again at 12:44pm) resulting in a total of 20 units of Novolog in 2 hours.   Continue with current insulin orders paying particular attention to insulin timing   Addendum; spent a great deal of time talking with Craigory about his diabetes which he developed 10 years ago. He tells me his 23 year old cousin, his aunt and his mother have all died of diabetes complications.  His aunt whom he lives with is supportive and pushed him to come to the hospital on this admission.  He confirms that Dr. Posey Pronto gave him an antidepressant but he never took it and she offer a counselor to him at [facility]- he has not gone. I strongly encouraged him to seek counseling and to take the antidepressant. Discussed the importance of taking the insulin to prevent long term complications.  Reviewed meal planning after he told me he "don't eat carbs", "they make you fat", "I only  drink Gatorade" and "I'm waiting for my aunt to bring me a biscuit".  Reviewed the "plate method" for eating and discussed the importance of carbs for health, weight gain, energy.  Patient did not want a referral to outpatient diabetes or nutrition education but I did speak to the inpatient dietitian, placed a referral for her and reviewed this information with her.    Gentry Fitz, RN, BA, MHA, CDE Diabetes Coordinator Inpatient Diabetes Program  [phone] (Team Pager) [phone] ([place]) 03/07/2017 8:53 AM

## 2017-03-07 NOTE — Progress Notes (Signed)
[facility] [facility] [place] NAME: Jonathan Mcmillan    MR#:  [id number]  DATE OF BIRTH:  1995-11-06  SUBJECTIVE:  CHIEF COMPLAINT:  Rt face and rt eyelid are less swollen, tolerating diet  REVIEW OF SYSTEMS:  CONSTITUTIONAL: reports fever, fatigue  EYES: No blurred or double vision.  EARS, NOSE, AND THROAT:  Rt face and eye lid swollen No tinnitus or ear pain.  RESPIRATORY: No cough, shortness of breath, wheezing or hemoptysis.  CARDIOVASCULAR: No chest pain, orthopnea, edema.  GASTROINTESTINAL: No nausea, vomiting, diarrhea or abdominal pain.  GENITOURINARY: No dysuria, hematuria.  ENDOCRINE: No polyuria, nocturia,  HEMATOLOGY: No anemia, easy bruising or bleeding SKIN: No rash or lesion. MUSCULOSKELETAL: No joint pain or arthritis.   NEUROLOGIC: No tingling, numbness, weakness.  PSYCHIATRY: No anxiety or depression.   DRUG ALLERGIES:  No Known Allergies  VITALS:  Blood pressure (!) 157/104, pulse (!) 101, temperature 98.6 F (37 C), temperature source Oral, resp. rate 20, height 5\' 9"  (1.753 m), weight 56.5 kg (124 lb 9.6 oz), SpO2 100 %.  PHYSICAL EXAMINATION:  GENERAL:  21 y.o.-year-old patient lying in the bed with no acute distress.  EYES: Pupils equal, round, reactive to light and accommodation. No scleral icterus. Extraocular muscles intact.  HEENT: Head atraumatic, normocephalic. Oropharynx couldn't examine as pt couldn't open mouth completely. Rt facial edema and tender  NECK:  Supple, no jugular venous distention. No thyroid enlargement, no tenderness.  LUNGS: Normal breath sounds bilaterally, no wheezing, rales,rhonchi or crepitation. No use of accessory muscles of respiration.  CARDIOVASCULAR: S1, S2 normal. No murmurs, rubs, or gallops.  ABDOMEN: Soft, nontender, nondistended. Bowel sounds present. No organomegaly or mass.  EXTREMITIES: No pedal edema, cyanosis, or clubbing.  NEUROLOGIC: Cranial nerves II through XII are  intact. Muscle strength 5/5 in all extremities. Sensation intact. Gait not checked.  PSYCHIATRIC: The patient is alert and oriented x 3.  SKIN: No obvious rash, lesion, or ulcer.    LABORATORY PANEL:   CBC  Recent Labs Lab 03/06/17 0240  WBC 17.0*  HGB 13.4  HCT 41.3  PLT 414   ------------------------------------------------------------------------------------------------------------------  Chemistries   Recent Labs Lab 03/06/17 0240  NA 130*  K 4.0  CL 92*  CO2 27  GLUCOSE 545*  BUN 5*  CREATININE 0.57*  CALCIUM 9.2  AST 15  ALT 11*  ALKPHOS 166*  BILITOT 0.7   ------------------------------------------------------------------------------------------------------------------  Cardiac Enzymes No results for input(s): TROPONINI in the last 168 hours. ------------------------------------------------------------------------------------------------------------------  RADIOLOGY:  Ct Maxillofacial W Contrast  Result Date: 03/06/2017 CLINICAL DATA:  RIGHT facial swelling and abscess for 1 week, on antibiotics. History of diabetes. EXAM: CT MAXILLOFACIAL WITH CONTRAST TECHNIQUE: Multidetector CT imaging of the maxillofacial structures was performed with intravenous contrast. Multiplanar CT image reconstructions were also generated. CONTRAST:  7mL ISOVUE-300 IOPAMIDOL (ISOVUE-300) INJECTION 61% COMPARISON:  CT neck November 23, 2016 FINDINGS: OSSEOUS: The mandible is intact, the condyles are located. No acute facial fracture. No destructive bony lesions. Multiple dental carie and scattered periapical abscess. ORBITS: Ocular globes and orbital contents are normal. SINUSES: Subcentimeter LEFT maxillary mucosal retention cysts without paranasal sinus air-fluid levels. Mastoid air cells are well aerated. Nasal septum is midline. SOFT TISSUES: RIGHT facial soft tissue swelling, skin thickening with subcutaneous fat stranding. Superimposed irregular 2.4 x 1.5 x 3.1 cm (AP by transverse  by CC) abscess RIGHT superficial face. Mildly enlarged RIGHT masseter muscle. Slight RIGHT parotid ductal dilatation compatible with reactive changes. LIMITED INTRACRANIAL:  Normal. IMPRESSION: 1. RIGHT facial cellulitis, 2.4 x 1.5 x 3.1 cm facial abscess. RIGHT masseter myositis. 2. Poor dentition. Electronically Signed   By: Elon Alas M.D.   On: 03/06/2017 04:40    EKG:   Orders placed or performed during the hospital encounter of 05/21/16  . EKG 12-Lead  . EKG 12-Lead  . EKG    ASSESSMENT AND PLAN:   This is a 21 year old male admitted for sepsis. 1. Sepsis: The patient meets criteria with  leukocytosis, tachycardia and tachypnea. Follow Blood cultures  Neg obtained in the emergency department.  Wound culture with rare gram-positive cocci  2. Cellulitis of the face: Including periorbital area; no evidence of post-septal involvement. Vancomycin per pharmacy dosing.  S/p drainage ; f/u with ENT for further guidance. Recommending doxycycline at the time of discharge  3. Diabetes mellitus type 1: With hypoglycemia;  continue basal insulin therapy as well as sliding scale hospitalized. Hydrate with intravenous fluid. Encourage Carbatrol diet.  4. Underweight: The patient's BMI is 17; encourage healthy diet and increase protein.  5. DVT prophylaxis: Lovenox     All the records are reviewed and case discussed with Care Management/Social Workerr. Management plans discussed with the patient, family and they are in agreement.  CODE STATUS: fc  TOTAL TIME TAKING CARE OF THIS PATIENT: 34 minutes.   POSSIBLE D/C IN 1  DAYS, DEPENDING ON CLINICAL CONDITION.  Note: This dictation was prepared with Dragon dictation along with smaller phrase technology. Any transcriptional errors that result from this process are unintentional.   Nicholes Mango M.D on 03/07/2017 at 4:32 PM  Between 7am to 6pm - Pager - [phone] After 6pm go to [email or web address] - password [email or web address] [facility]  Office  [phone]  CC: Primary care physician; [facility], [place]

## 2017-03-07 NOTE — Progress Notes (Signed)
Nutrition Education Note   RD consulted for nutrition education regarding diabetes.   Lab Results  Component Value Date   HGBA1C 13.9 (H) 03/07/2017    RD provided "Nutrition and Type I Diabetes" handout from the Academy of Nutrition and Dietetics. Discussed different food groups and their effects on blood sugar, emphasizing carbohydrate-containing foods. Provided list of carbohydrates and recommended serving sizes of common foods.  Discussed importance of controlled and consistent carbohydrate intake throughout the day. Provided examples of ways to balance meals/snacks and encouraged intake of high-fiber, whole grain complex carbohydrates. Teach back method used.  Expect poor compliance.  Body mass index is 18.4 kg/m. Pt meets criteria for underweight based on current BMI.  Current diet order is carbohydrate controlled, patient is consuming approximately 100% of meals at this time.   RD following this pt  Koleen Distance MS, RD, LDN Pager #[phone] After Hours Pager: [phone]

## 2017-03-07 NOTE — Progress Notes (Signed)
Initial Nutrition Assessment  DOCUMENTATION CODES:   Non-severe (moderate) malnutrition in context of acute illness/injury  INTERVENTION:   Double protein with all meals   MVI  Diabetes education   NUTRITION DIAGNOSIS:   Malnutrition (moderate) related to chronic illness (Diabetes Type I) as evidenced by moderate depletions of muscle mass, moderate depletion of body fat.  GOAL:   Patient will meet greater than or equal to 90% of their needs  MONITOR:   PO intake, Labs, Weight trends, Skin  REASON FOR ASSESSMENT:   Consult Assessment of nutrition requirement/status  ASSESSMENT:   21 y.o. year old male with h/o type I diabetes on 70/30 insulin status post ER I&D of facial abscess   Met with pt in room today. Pt reports that his appetite is often poor at home but he does eat; pt reports "I eat the things I like". Pt reports that his appetite today is fair but he does not like the hospital food. Pt on phone at time of RD visit requesting a biscuit from family members. Pt also had multiple food containers on his bedside table including [employer] nuggets. Pt reports that he has been eating food brought in from home. Pt does report that he feels he has lost some weight. Pt states that he used to weigh around 160lbs over the summer. It appears pt has lost some significant weight since then; per chart 6lbs(5%) over the past month. Pt received education today regarding his diabetes. Pt has been non compliant with his diet and insulin regimen. RD suspects pt's non compliance is related to his age and lack of knowledge of the importance of his lifestyle choices. Pt reports to RD that he drinks mainly Gatorade as he plays sports and sweats a lot. RD discussed with pt about the high sugar content in sports drinks and encouraged either G2 (gatorade with reduced sugar) or Propel. RD also discussed the importance of adequate protein take; pt declines supplements but will try double protein with  meals.    Medications reviewed and include: colace, lovenox, insulin, vancomycin, NaCl _0 /hr, oxycodone   Labs reviewed: Na 130(L), K 4.0 wnl, Cl 92(L), BUN 5(L), creat 0.57(L), alb 3.4(L)- 10/24 Wbc- 17(H)- 10/24 Glucose- 545 AIC 13.9(H)  Nutrition-Focused physical exam completed. Findings are moderate fat depletions in orbital regions, arms, and chest, moderate to severe muscle depletions over entire body, and no edema.   Diet Order:  Diet Carb Modified Fluid consistency: Thin; Room service appropriate? Yes  Skin:  Wound (see comment) (incision face )  Last BM:  10/24  Height:   Ht Readings from Last 1 Encounters:  03/06/17 _1  (1.753 m)    Weight:   Wt Readings from Last 1 Encounters:  03/07/17 124 lb 9.6 oz (56.5 kg)    Ideal Body Weight:  72.7 kg  BMI:  Body mass index is 18.4 kg/m.  Estimated Nutritional Needs:   Kcal:  1900-2200kcal/day   Protein:  74-85g/day   Fluid:  >1.9L/day   EDUCATION NEEDS:   Education needs addressed  Koleen Distance MS, RD, LDN Pager #[phone] After Hours Pager: [phone]

## 2017-03-07 NOTE — Progress Notes (Signed)
Jonathan Mcmillan, Jonathan Mcmillan [id number] 24-Sep-1995 Riley Nearing, MD   SUBJECTIVE: This 21 y.o. year old male is status post ER I&D of facial abscess. Doing well with no new complaints. Wound continues to drain.  Medications:  Current Facility-Administered Medications  Medication Dose Route Frequency Provider Last Rate Last Dose  . 0.9 %  sodium chloride infusion   Intravenous Continuous Harrie Foreman, MD 100 mL/hr at 03/07/17 0957    . acetaminophen (TYLENOL) tablet 650 mg  650 mg Oral Q6H PRN Harrie Foreman, MD       Or  . acetaminophen (TYLENOL) suppository 650 mg  650 mg Rectal Q6H PRN Harrie Foreman, MD      . docusate sodium (COLACE) capsule 100 mg  100 mg Oral BID Harrie Foreman, MD      . enoxaparin (LOVENOX) injection 40 mg  40 mg Subcutaneous Q24H Harrie Foreman, MD      . insulin aspart (novoLOG) injection 0-5 Units  0-5 Units Subcutaneous QHS Nicholes Mango, MD   2 Units at 03/06/17 2200  . insulin aspart (novoLOG) injection 0-9 Units  0-9 Units Subcutaneous TID WC Nicholes Mango, MD   3 Units at 03/07/17 0810  . insulin aspart (novoLOG) injection 3 Units  3 Units Subcutaneous TID WC Gouru, Aruna, MD   3 Units at 03/06/17 1244  . insulin glargine (LANTUS) injection 10 Units  10 Units Subcutaneous Daily Nicholes Mango, MD   10 Units at 03/07/17 0957  . morphine 2 MG/ML injection 2 mg  2 mg Intravenous Q4H PRN Gouru, Aruna, MD   2 mg at 03/07/17 1130  . ondansetron (ZOFRAN) tablet 4 mg  4 mg Oral Q6H PRN Harrie Foreman, MD       Or  . ondansetron [facility]) injection 4 mg  4 mg Intravenous Q6H PRN Harrie Foreman, MD      . oxyCODONE (Oxy IR/ROXICODONE) immediate release tablet 5 mg  5 mg Oral Q4H PRN Nicholes Mango, MD   5 mg at 03/07/17 7619  . vancomycin (VANCOCIN) IVPB 1000 mg/200 mL premix  1,000 mg Intravenous Q12H Coffee, Donna Christen, [facility]   Stopped at 03/06/17 2300  .  Medications Prior to Admission  Medication Sig Dispense Refill  . doxycycline (VIBRA-TABS) 100 MG  tablet Take 100 mg by mouth 2 (two) times daily.    . insulin aspart protamine- aspart (NOVOLOG MIX 70/30) (70-30) 100 UNIT/ML injection Inject 0.13 mLs (13 Units total) into the skin 2 (two) times daily before a meal. (Patient taking differently: Inject 20 Units into the skin 2 (two) times daily before a meal. ) 10 mL 11    OBJECTIVE:  PHYSICAL EXAM  Vitals: Blood pressure 127/78, pulse 90, temperature 99 F (37.2 C), temperature source Oral, resp. rate 18, height 5' 9"  (1.753 m), weight 118 lb (53.5 kg), SpO2 100 %.. General: Well-developed, Well-nourished in no acute distress Mood: Mood and affect well adjusted, pleasant and cooperative. Orientation: Grossly alert and oriented. Vocal Quality: No hoarseness. Communicates verbally. head and Face: Right facial edema about the same. Purulent drainage from I&D site. Tract extends from mid cheek up over the malar region. Mild periorbital edema is soft, non-fluctuant  MEDICAL DECISION MAKING: Data Review:  Results for orders placed or performed during the hospital encounter of 03/06/17 (from the past 48 hour(s))  CBC with Differential     Status: Abnormal   Collection Time: 03/06/17  2:40 AM  Result Value Ref Range   WBC 17.0 (H) 3.8 -  10.6 K/uL   RBC 4.81 4.40 - 5.90 MIL/uL   Hemoglobin 13.4 13.0 - 18.0 g/dL   HCT 41.3 40.0 - 52.0 %   MCV 85.9 80.0 - 100.0 fL   MCH 27.9 26.0 - 34.0 pg   MCHC 32.4 32.0 - 36.0 g/dL   RDW 13.4 11.5 - 14.5 %   Platelets 414 150 - 440 K/uL   Neutrophils Relative % 76 %   Neutro Abs 13.0 (H) 1.4 - 6.5 K/uL   Lymphocytes Relative 14 %   Lymphs Abs 2.3 1.0 - 3.6 K/uL   Monocytes Relative 7 %   Monocytes Absolute 1.2 (H) 0.2 - 1.0 K/uL   Eosinophils Relative 2 %   Eosinophils Absolute 0.3 0 - 0.7 K/uL   Basophils Relative 1 %   Basophils Absolute 0.2 (H) 0 - 0.1 K/uL  Comprehensive metabolic panel     Status: Abnormal   Collection Time: 03/06/17  2:40 AM  Result Value Ref Range   Sodium 130 (L) 135 -  145 mmol/L   Potassium 4.0 3.5 - 5.1 mmol/L   Chloride 92 (L) 101 - 111 mmol/L   CO2 27 22 - 32 mmol/L   Glucose, Bld 545 (HH) 65 - 99 mg/dL    Comment: CRITICAL RESULT CALLED TO, READ BACK BY AND VERIFIED WITH VANESSA ASHLEY AT 8242 03/06/17 ALV    BUN 5 (L) 6 - 20 mg/dL   Creatinine, Ser 0.57 (L) 0.61 - 1.24 mg/dL   Calcium 9.2 8.9 - 10.3 mg/dL   Total Protein 8.1 6.5 - 8.1 g/dL   Albumin 3.4 (L) 3.5 - 5.0 g/dL   AST 15 15 - 41 U/L   ALT 11 (L) 17 - 63 U/L   Alkaline Phosphatase 166 (H) 38 - 126 U/L   Total Bilirubin 0.7 0.3 - 1.2 mg/dL   GFR calc non Af Amer >60 >60 mL/min   GFR calc Af Amer >60 >60 mL/min    Comment: (NOTE) The eGFR has been calculated using the CKD EPI equation. This calculation has not been validated in all clinical situations. eGFR's persistently <60 mL/min signify possible Chronic Kidney Disease.    Anion gap 11 5 - 15  Urinalysis, Complete w Microscopic     Status: Abnormal   Collection Time: 03/06/17  2:40 AM  Result Value Ref Range   Color, Urine STRAW (A) YELLOW   APPearance CLEAR (A) CLEAR   Specific Gravity, Urine 1.029 1.005 - 1.030   pH 7.0 5.0 - 8.0   Glucose, UA >=500 (A) NEGATIVE mg/dL   Hgb urine dipstick SMALL (A) NEGATIVE   Bilirubin Urine NEGATIVE NEGATIVE   Ketones, ur NEGATIVE NEGATIVE mg/dL   Protein, ur NEGATIVE NEGATIVE mg/dL   Nitrite NEGATIVE NEGATIVE   Leukocytes, UA NEGATIVE NEGATIVE   RBC / HPF 0-5 0 - 5 RBC/hpf   WBC, UA 0-5 0 - 5 WBC/hpf   Bacteria, UA RARE (A) NONE SEEN   Squamous Epithelial / LPF NONE SEEN NONE SEEN  TSH     Status: None   Collection Time: 03/06/17  2:40 AM  Result Value Ref Range   TSH 1.430 0.350 - 4.500 uIU/mL    Comment: Performed by a 3rd Generation assay with a functional sensitivity of <=0.01 uIU/mL.  Hemoglobin A1c     Status: Abnormal   Collection Time: 03/06/17  2:40 AM  Result Value Ref Range   Hgb A1c MFr Bld 14.1 (H) 4.8 - 5.6 %    Comment: (NOTE) Pre  diabetes:           5.7%-6.4% Diabetes:              >6.4% Glycemic control for   <7.0% adults with diabetes    Mean Plasma Glucose 357.97 mg/dL    Comment: Performed at [facility] [address], [place], [place] [address]  Glucose, capillary     Status: Abnormal   Collection Time: 03/06/17  3:07 AM  Result Value Ref Range   Glucose-Capillary 558 (HH) 65 - 99 mg/dL   Comment 1 Notify RN   Lactic acid, plasma     Status: None   Collection Time: 03/06/17  3:35 AM  Result Value Ref Range   Lactic Acid, Venous 1.9 0.5 - 1.9 mmol/L  Culture, blood (Routine x 2)     Status: None (Preliminary result)   Collection Time: 03/06/17  3:35 AM  Result Value Ref Range   Specimen Description BLOOD RT FOREARM    Special Requests      BOTTLES DRAWN AEROBIC AND ANAEROBIC Blood Culture adequate volume   Culture NO GROWTH 1 DAY    Report Status PENDING   Culture, blood (Routine x 2)     Status: None (Preliminary result)   Collection Time: 03/06/17  3:36 AM  Result Value Ref Range   Specimen Description BLOOD LT FOREARM    Special Requests      BOTTLES DRAWN AEROBIC AND ANAEROBIC Blood Culture adequate volume   Culture NO GROWTH 1 DAY    Report Status PENDING   Wound or Superficial Culture     Status: None (Preliminary result)   Collection Time: 03/06/17  7:32 AM  Result Value Ref Range   Specimen Description FACE    Special Requests Normal    Gram Stain      MODERATE WBC PRESENT, PREDOMINANTLY PMN RARE GRAM POSITIVE COCCI Performed at [facility], [place] [address] [place], [place] [address]    Culture PENDING    Report Status PENDING   Glucose, capillary     Status: Abnormal   Collection Time: 03/06/17  8:12 AM  Result Value Ref Range   Glucose-Capillary 443 (H) 65 - 99 mg/dL  Glucose, capillary     Status: Abnormal   Collection Time: 03/06/17  9:03 AM  Result Value Ref Range   Glucose-Capillary 358 (H) 65 - 99 mg/dL  Glucose, capillary     Status: Abnormal   Collection Time: 03/06/17  10:07 AM  Result Value Ref Range   Glucose-Capillary 283 (H) 65 - 99 mg/dL   Comment 1 Notify RN   Glucose, capillary     Status: Abnormal   Collection Time: 03/06/17 11:53 AM  Result Value Ref Range   Glucose-Capillary 433 (H) 65 - 99 mg/dL   Comment 1 Notify RN   Glucose, capillary     Status: Abnormal   Collection Time: 03/06/17  2:46 PM  Result Value Ref Range   Glucose-Capillary 164 (H) 65 - 99 mg/dL   Comment 1 Notify RN   Glucose, capillary     Status: Abnormal   Collection Time: 03/06/17  4:33 PM  Result Value Ref Range   Glucose-Capillary 57 (L) 65 - 99 mg/dL   Comment 1 Notify RN   Glucose, capillary     Status: None   Collection Time: 03/06/17  4:59 PM  Result Value Ref Range   Glucose-Capillary 67 65 - 99 mg/dL  Glucose, capillary     Status: Abnormal  Collection Time: 03/06/17  5:26 PM  Result Value Ref Range   Glucose-Capillary 164 (H) 65 - 99 mg/dL   Comment 1 Notify RN   Glucose, capillary     Status: Abnormal   Collection Time: 03/06/17  9:47 PM  Result Value Ref Range   Glucose-Capillary 247 (H) 65 - 99 mg/dL  Hemoglobin A1c     Status: Abnormal   Collection Time: 03/07/17  4:41 AM  Result Value Ref Range   Hgb A1c MFr Bld 13.9 (H) 4.8 - 5.6 %    Comment: (NOTE) Pre diabetes:          5.7%-6.4% Diabetes:              >6.4% Glycemic control for   <7.0% adults with diabetes    Mean Plasma Glucose 352.23 mg/dL    Comment: Performed at [facility] [address] [place], [place] [address]  Glucose, capillary     Status: Abnormal   Collection Time: 03/07/17  7:46 AM  Result Value Ref Range   Glucose-Capillary 220 (H) 65 - 99 mg/dL  . Ct Maxillofacial W Contrast  Result Date: 03/06/2017 CLINICAL DATA:  RIGHT facial swelling and abscess for 1 week, on antibiotics. History of diabetes. EXAM: CT MAXILLOFACIAL WITH CONTRAST TECHNIQUE: Multidetector CT imaging of the maxillofacial structures was performed with intravenous contrast. Multiplanar CT  image reconstructions were also generated. CONTRAST:  21m ISOVUE-300 IOPAMIDOL (ISOVUE-300) INJECTION 61% COMPARISON:  CT neck November 23, 2016 FINDINGS: OSSEOUS: The mandible is intact, the condyles are located. No acute facial fracture. No destructive bony lesions. Multiple dental carie and scattered periapical abscess. ORBITS: Ocular globes and orbital contents are normal. SINUSES: Subcentimeter LEFT maxillary mucosal retention cysts without paranasal sinus air-fluid levels. Mastoid air cells are well aerated. Nasal septum is midline. SOFT TISSUES: RIGHT facial soft tissue swelling, skin thickening with subcutaneous fat stranding. Superimposed irregular 2.4 x 1.5 x 3.1 cm (AP by transverse by CC) abscess RIGHT superficial face. Mildly enlarged RIGHT masseter muscle. Slight RIGHT parotid ductal dilatation compatible with reactive changes. LIMITED INTRACRANIAL: Normal. IMPRESSION: 1. RIGHT facial cellulitis, 2.4 x 1.5 x 3.1 cm facial abscess. RIGHT masseter myositis. 2. Poor dentition. Electronically Signed   By: CElon AlasM.D.   On: 03/06/2017 04:40  .   ASSESSMENT: Facial cellulitis/abscess. No worsening, but still draining gross purulence  PLAN: Continue IV Vanc. Will have nursing staff clean and pack wound TID   BRiley Nearing MD 03/07/2017 11:46 AM

## 2017-03-08 LAB — GLUCOSE, CAPILLARY
Glucose-Capillary: 294 mg/dL — ABNORMAL HIGH (ref 65–99)
Glucose-Capillary: 323 mg/dL — ABNORMAL HIGH (ref 65–99)

## 2017-03-08 MED ORDER — OXYCODONE HCL 5 MG PO TABS: 5.0000 mg | ORAL_TABLET | Freq: Four times a day (QID) | ORAL | 0 refills | Status: DC | PRN

## 2017-03-08 MED ORDER — INSULIN LISPRO 100 UNIT/ML ~~LOC~~ SOLN: SUBCUTANEOUS | 3 refills | Status: DC

## 2017-03-08 MED ORDER — VANCOMYCIN HCL IN DEXTROSE 1-5 GM/200ML-% IV SOLN
1000.0000 mg | Freq: Three times a day (TID) | INTRAVENOUS | Status: DC
  Administered 2017-03-08: 1000 mg via INTRAVENOUS
  Filled 2017-03-08 (×4): qty 200

## 2017-03-08 MED ORDER — ADULT MULTIVITAMIN W/MINERALS CH: 1.0000 | ORAL_TABLET | Freq: Every day | ORAL | Status: DC

## 2017-03-08 MED ORDER — INSULIN GLARGINE 100 UNIT/ML ~~LOC~~ SOLN
12.0000 [IU] | Freq: Every day | SUBCUTANEOUS | Status: DC
  Filled 2017-03-08: qty 0.12

## 2017-03-08 MED ORDER — DOCUSATE SODIUM 100 MG PO CAPS: 100.0000 mg | ORAL_CAPSULE | Freq: Two times a day (BID) | ORAL | 0 refills | Status: DC | PRN

## 2017-03-08 MED ORDER — INSULIN LISPRO 100 UNIT/ML ~~LOC~~ SOLN: SUBCUTANEOUS | 1 refills | Status: DC

## 2017-03-08 MED ORDER — INSULIN ASPART 100 UNIT/ML ~~LOC~~ SOLN: 5.0000 [IU] | Freq: Three times a day (TID) | SUBCUTANEOUS | Status: DC

## 2017-03-08 MED ORDER — INSULIN GLARGINE 100 UNIT/ML ~~LOC~~ SOLN: 12.0000 [IU] | Freq: Every day | SUBCUTANEOUS | 1 refills | Status: DC

## 2017-03-08 MED ORDER — ACETAMINOPHEN 325 MG PO TABS: 650.0000 mg | ORAL_TABLET | Freq: Four times a day (QID) | ORAL | Status: DC | PRN

## 2017-03-08 NOTE — Care Management (Signed)
Patient to discharge home today.  Will follow up with Jonathan Mcmillan.  Scripts for insulin faxed to [facility] .  Confirmed with Pharmacist that they are in stock and will be available at discharge for no cost.  Patient provided with application for [facility]  And Odc.  RNCM signing off.

## 2017-03-08 NOTE — Progress Notes (Signed)
MD ordered patient to be discharged home.  Discharge instructions were reviewed with the patient and he voiced understanding.  Follow-up appointment was made.  Prescriptions given to the patient.  IVs were removed with catheter intact.  I spoke with Dr. Richardson Landry about what the patient should do for dressing changes at home.  He said to continue to irrigate and pack as we have been doing here.  Patient given supplies for dressing changes at home, and stated that he had family that would help him.  All patients questions were answered.  Patient walked out escorted by myself.

## 2017-03-08 NOTE — Progress Notes (Addendum)
Inpatient Diabetes Program Recommendations  AACE/ADA: New Consensus Statement on Inpatient Glycemic Control (2015)  Target Ranges:  Prepandial:   less than 140 mg/dL      Peak postprandial:   less than 180 mg/dL (1-2 hours)      Critically ill patients:  140 - 180 mg/dL   Lab Results  Component Value Date   GLUCAP 294 (H) 03/08/2017   HGBA1C 13.9 (H) 03/07/2017    Review of Glycemic Control   Results for Jonathan Mcmillan, Jonathan Mcmillan (MRN [id number]) as of 03/08/2017 09:51  Ref. 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 Continue Novolog sliding scale (correction) as ordered.  Text paged Dr. Margaretmary Eddy  @ 1:49pm  Gentry Fitz, RN, [facility] [place] [facility], [facility], CDE Diabetes Coordinator Inpatient Diabetes Program  [phone] (Team Pager) [phone] ([place]) 03/08/2017 9:54 AM

## 2017-03-08 NOTE — Progress Notes (Signed)
ANTIBIOTIC CONSULT NOTE - INITIAL  Pharmacy Consult for vancomycin Indication: sepsis and cellulitis  No Known Allergies  Patient Measurements: Height: 5\' 9"  (175.3 cm) Weight: 124 lb 9.6 oz (56.5 kg) (bed weight) IBW/kg (Calculated) : 70.7   Ke: 0.094 T1/2 7.37hrs Dosing weight 53.5kg  Vancomycin target trough 15-20  Vital Signs: Temp: 98.6 F (37 C) (10/25 2052) Temp Source: Oral (10/25 2052) BP: 159/99 (10/25 2052) Pulse Rate: 111 (10/25 2052) Intake/Output from previous day: 10/25 0701 - 10/26 0700 In: 1553.7 [P.O.:358; I.V.:995.7; IV Piggyback:200] Out: 600 [Urine:600] Intake/Output from this shift: Total I/O In: 118 [P.O.:118] Out: -   Labs:  Recent Labs  03/06/17 0240  WBC 17.0*  HGB 13.4  PLT 414  CREATININE 0.57*   Estimated Creatinine Clearance: 114.8 mL/min (A) (by C-G formula based on SCr of 0.57 mg/dL (L)).  Recent Labs  03/07/17 2249  [place] 4*     Microbiology: Recent Results (from the past 720 hour(s))  Culture, blood (Routine x 2)     Status: None (Preliminary result)   Collection Time: 03/06/17  3:35 AM  Result Value Ref Range Status   Specimen Description BLOOD RT FOREARM  Final   Special Requests   Final    BOTTLES DRAWN AEROBIC AND ANAEROBIC Blood Culture adequate volume   Culture NO GROWTH 1 DAY  Final   Report Status PENDING  Incomplete  Culture, blood (Routine x 2)     Status: None (Preliminary result)   Collection Time: 03/06/17  3:36 AM  Result Value Ref Range Status   Specimen Description BLOOD LT FOREARM  Final   Special Requests   Final    BOTTLES DRAWN AEROBIC AND ANAEROBIC Blood Culture adequate volume   Culture NO GROWTH 1 DAY  Final   Report Status PENDING  Incomplete  Wound or Superficial Culture     Status: None (Preliminary result)   Collection Time: 03/06/17  7:32 AM  Result Value Ref Range Status   Specimen Description FACE  Final   Special Requests Normal  Final   Gram Stain   Final    MODERATE WBC  PRESENT, PREDOMINANTLY PMN RARE GRAM POSITIVE COCCI    Culture   Final    CULTURE REINCUBATED FOR BETTER GROWTH Performed at [facility], [employer] [address] [place], [place] [address]    Report Status PENDING  Incomplete    Medical History: Past Medical History:  Diagnosis Date  . Diabetes 1.5, managed as type 1 ([place])    Estimated Creatinine Clearance: 114.8 mL/min (A) (by C-G formula based on SCr of 0.57 mg/dL (L)).  Medications:  Prescriptions Prior to Admission  Medication Sig Dispense Refill Last Dose  . doxycycline (VIBRA-TABS) 100 MG tablet Take 100 mg by mouth 2 (two) times daily.   03/05/2017 at Unknown time  . insulin aspart protamine- aspart (NOVOLOG MIX 70/30) (70-30) 100 UNIT/ML injection Inject 0.13 mLs (13 Units total) into the skin 2 (two) times daily before a meal. (Patient taking differently: Inject 20 Units into the skin 2 (two) times daily before a meal. ) 10 mL 11 03/05/2017 at Unknown time   Scheduled:  . docusate sodium  100 mg Oral BID  . enoxaparin (LOVENOX) injection  40 mg Subcutaneous Q24H  . insulin aspart  0-5 Units Subcutaneous QHS  . insulin aspart  0-9 Units Subcutaneous TID WC  . insulin aspart  3 Units Subcutaneous TID WC  . insulin glargine  10 Units Subcutaneous Daily  . multivitamin with minerals  1 tablet Oral Daily   Infusions:  . sodium chloride 100 mL/hr at 03/07/17 1930  . vancomycin     PRN: acetaminophen **OR** acetaminophen, morphine injection, ondansetron **OR** ondansetron (ZOFRAN) IV, oxyCODONE Anti-infectives    Start     Dose/Rate Route Frequency Ordered Stop   03/08/17 0731  vancomycin (VANCOCIN) IVPB 1000 mg/200 mL premix     1,000 mg 200 mL/hr over 60 Minutes Intravenous Every 8 hours 03/08/17 0049     03/06/17 1100  vancomycin (VANCOCIN) IVPB 1000 mg/200 mL premix  Status:  Discontinued     1,000 mg 200 mL/hr over 60 Minutes Intravenous Every 8 hours 03/06/17 1021 03/06/17 1027   03/06/17 1100  vancomycin  (VANCOCIN) IVPB 1000 mg/200 mL premix  Status:  Discontinued     1,000 mg 200 mL/hr over 60 Minutes Intravenous Every 12 hours 03/06/17 1027 03/08/17 0049   03/06/17 0345  vancomycin (VANCOCIN) IVPB 1000 mg/200 mL premix     1,000 mg 200 mL/hr over 60 Minutes Intravenous  Once 03/06/17 0332 03/06/17 0445     Assessment: 21 year old male with face abscess cellulitis and also sepsis. Received vancomycin 1gm 10/24am, will begin on vancomycin 1gm iv q12h.   Goal of Therapy:  Vancomycin trough level 15-20 mcg/ml  Plan:  Expected duration 10 days with resolution of temperature and/or normalization of WBC  Follow up cultures Order vancomycin trough before 4th dose of vancomycin 1gm iv q12h, 10/25@2230   10/26 @ 2249 VT 4 subtherapeutic. Will increase dose to vanc 1g IV q8h and will recheck VT 10/27 @ 0600 prior to 4th dose.  Tobie Lords, PharmD, BCPS Clinical Pharmacist 03/08/2017

## 2017-03-08 NOTE — Discharge Instructions (Signed)
Follow-up with primary care physician in 5-7 days or sooner as needed

## 2017-03-08 NOTE — Discharge Summary (Signed)
[facility] NAME: Jonathan Mcmillan    MR#:  [id number]  DATE OF BIRTH:  1996/02/24  DATE OF ADMISSION:  03/06/2017 ADMITTING PHYSICIAN: Harrie Foreman, MD  DATE OF DISCHARGE: 03/08/17  PRIMARY CARE PHYSICIAN: [facility]    ADMISSION DIAGNOSIS:  Abscess [L02.91] Hyperglycemia [R73.9] Facial cellulitis [V61.607] Infective myositis of other site [M60.08]  DISCHARGE DIAGNOSIS:  Active Problems:   Sepsis ([place])  Facial cellulitis-right side SECONDARY DIAGNOSIS:   Past Medical History:  Diagnosis Date  . Diabetes 1.5, managed as type 1 [facility])     HOSPITAL COURSE:   HPI: This is a 21 year old male with type 1 diabetes who presents to the emergency department due to fever and facial swelling. The patient has had a lesion on his face that was an ingrown hair that has become infected. He had been taking double strength Bactrim for 1 week without relief. He had seen a dermatologist for the infection and had a superficial debridement but the lesion persisted. He has developed subjective fevers but denies nausea, vomiting or diarrhea. His right eye is also now swollen but he denies pain or limitations of his extraocular movements. CT scan in the emergency department showed facial cellulitis with small abscess as well as myositis of the right masseter. ENT was consulted who agreed with IV antibiotics after which the emergency department staff called the hospitalist service for admission.   1. Sepsis: The patient meets criteria with  leukocytosis, tachycardia and tachypnea. Follow Blood cultures  Streptococcus agalactiae,obtained in the emergency department.  Wound culture with rare gram-positive cocci  2. Cellulitis of the face: Including periorbital area; no evidence of post-septal involvement. Vancomycin per pharmacy dosing.  S/p drainage ; ENT recommending doxycycline at the time of discharge,Will  discharge patient with by mouth doxycycline and oxycodone as needed for pain. Pt was given doxycycline prescription by pcp and has a filled at home  3. Diabetes mellitus type 1:  continue basal insulin therapy as well as sliding scale hospitalized. Hydrated with intravenous fluid. Encourage Carbatrol diet.  4. Underweight: The patient'sBMI is 17; encourage healthy diet and increase protein.  5. DVT prophylaxis: Lovenox  DISCHARGE CONDITIONS:   fair  CONSULTS OBTAINED:  Treatment Team:  Clyde Canterbury, MD   PROCEDURES incision and drainage of the right facial abscess done by the ED physician  DRUG ALLERGIES:  No Known Allergies  DISCHARGE MEDICATIONS:   Current Discharge Medication List    START taking these medications   Details  acetaminophen (TYLENOL) 325 MG tablet Take 2 tablets (650 mg total) by mouth every 6 (six) hours as needed for mild pain (or Fever >/= 101).    docusate sodium (COLACE) 100 MG capsule Take 1 capsule (100 mg total) by mouth 2 (two) times daily as needed for mild constipation. Qty: 10 capsule, Refills: 0    insulin glargine (LANTUS) 100 UNIT/ML injection Inject 0.12 mLs (12 Units total) into the skin daily. Qty: 100 mL, Refills: 1    insulin lispro (HUMALOG) 100 UNIT/ML injection 5 units 3 times a day with meals Qty: 100 mL, Refills: 1    Multiple Vitamin (MULTIVITAMIN WITH MINERALS) TABS tablet Take 1 tablet by mouth daily.    oxyCODONE (OXY IR/ROXICODONE) 5 MG immediate release tablet Take 1 tablet (5 mg total) by mouth every 6 (six) hours as needed for moderate pain or breakthrough pain. Qty: 30 tablet, Refills: 0      CONTINUE these  medications which have NOT CHANGED   Details  doxycycline (VIBRA-TABS) 100 MG tablet Take 100 mg by mouth 2 (two) times daily.      STOP taking these medications     insulin aspart protamine- aspart (NOVOLOG MIX 70/30) (70-30) 100 UNIT/ML injection          DISCHARGE INSTRUCTIONS:  Follow-up with  primary care physician in 5-7 days or sooner as needed    DIET:  Diabetic diet  DISCHARGE CONDITION:  Fair  ACTIVITY:  Activity as tolerated  OXYGEN:  Home Oxygen: No.   Oxygen Delivery: room air  DISCHARGE LOCATION:  home   If you experience worsening of your admission symptoms, develop shortness of breath, life threatening emergency, suicidal or homicidal thoughts you must seek medical attention immediately by calling 911 or calling your MD immediately  if symptoms less severe.  You Must read complete instructions/literature along with all the possible adverse reactions/side effects for all the Medicines you take and that have been prescribed to you. Take any new Medicines after you have completely understood and accpet all the possible adverse reactions/side effects.   Please note  You were cared for by a hospitalist during your hospital stay. If you have any questions about your discharge medications or the care you received while you were in the hospital after you are discharged, you can call the unit and asked to speak with the hospitalist on call if the hospitalist that took care of you is not available. Once you are discharged, your primary care physician will handle any further medical issues. Please note that NO REFILLS for any discharge medications will be authorized once you are discharged, as it is imperative that you return to your primary care physician (or establish a relationship with a primary care physician if you do not have one) for your aftercare needs so that they can reassess your need for medications and monitor your lab values.     Today  Chief Complaint  Patient presents with  . Facial Swelling   Patient examined getting in the hallway and feeling much better and wants to go home Tolerating diet  ROS:  CONSTITUTIONAL: Denies fevers, chills. Denies any fatigue, weakness.  EYES: Denies blurry vision, double vision, eye pain. EARS, NOSE, THROAT:  Denies tinnitus, ear pain, hearing loss. RESPIRATORY: Denies cough, wheeze, shortness of breath.  CARDIOVASCULAR: Denies chest pain, palpitations, edema.  GASTROINTESTINAL: Denies nausea, vomiting, diarrhea, abdominal pain. Denies bright red blood per rectum. GENITOURINARY: Denies dysuria, hematuria. ENDOCRINE: Denies nocturia or thyroid problems. HEMATOLOGIC AND LYMPHATIC: Denies easy bruising or bleeding. SKIN: decreased right facial swellingDenies rash or lesion. MUSCULOSKELETAL: Denies pain in neck, back, shoulder, knees, hips or arthritic symptoms.  NEUROLOGIC: Denies paralysis, paresthesias.  PSYCHIATRIC: Denies anxiety or depressive symptoms.   VITAL SIGNS:  Blood pressure (!) 149/88, pulse 100, temperature 98.5 F (36.9 C), temperature source Oral, resp. rate 20, height 5\' 9"  (1.753 m), weight 56.5 kg (124 lb 9.6 oz), SpO2 100 %.  I/O:    Intake/Output Summary (Last 24 hours) at 03/08/17 1417 Last data filed at 03/08/17 1251  Gross per 24 hour  Intake           2857.1 ml  Output              600 ml  Net           2257.1 ml    PHYSICAL EXAMINATION:  GENERAL:  21 y.o.-year-old patient lying in the bed with no acute distress.  EYES: Pupils equal, round, reactive to light and accommodation. No scleral icterus. Extraocular muscles intact.  HEENT:right facial tenderness and swelling is improving  Head atraumatic, normocephalic.   NECK:  Supple, no jugular venous distention. No thyroid enlargement, no tenderness.  LUNGS: Normal breath sounds bilaterally, no wheezing, rales,rhonchi or crepitation. No use of accessory muscles of respiration.  CARDIOVASCULAR: S1, S2 normal. No murmurs, rubs, or gallops.  ABDOMEN: Soft, non-tender, non-distended. Bowel sounds present. No organomegaly or mass.  EXTREMITIES: No pedal edema, cyanosis, or clubbing.  NEUROLOGIC: Cranial nerves II through XII are intact. Muscle strength 5/5 in all extremities. Sensation intact. Gait not checked.   PSYCHIATRIC: The patient is alert and oriented x 3.  SKIN: No obvious rash, lesion, or ulcer.   DATA REVIEW:   CBC  Recent Labs Lab 03/06/17 0240  WBC 17.0*  HGB 13.4  HCT 41.3  PLT 414    Chemistries   Recent Labs Lab 03/06/17 0240  NA 130*  K 4.0  CL 92*  CO2 27  GLUCOSE 545*  BUN 5*  CREATININE 0.57*  CALCIUM 9.2  AST 15  ALT 11*  ALKPHOS 166*  BILITOT 0.7    Cardiac Enzymes No results for input(s): TROPONINI in the last 168 hours.  Microbiology Results  Results for orders placed or performed during the hospital encounter of 03/06/17  Culture, blood (Routine x 2)     Status: None (Preliminary result)   Collection Time: 03/06/17  3:35 AM  Result Value Ref Range Status   Specimen Description BLOOD RT FOREARM  Final   Special Requests   Final    BOTTLES DRAWN AEROBIC AND ANAEROBIC Blood Culture adequate volume   Culture NO GROWTH 2 DAYS  Final   Report Status PENDING  Incomplete  Culture, blood (Routine x 2)     Status: None (Preliminary result)   Collection Time: 03/06/17  3:36 AM  Result Value Ref Range Status   Specimen Description BLOOD LT FOREARM  Final   Special Requests   Final    BOTTLES DRAWN AEROBIC AND ANAEROBIC Blood Culture adequate volume   Culture NO GROWTH 2 DAYS  Final   Report Status PENDING  Incomplete  Wound or Superficial Culture     Status: None (Preliminary result)   Collection Time: 03/06/17  7:32 AM  Result Value Ref Range Status   Specimen Description FACE  Final   Special Requests Normal  Final   Gram Stain   Final    MODERATE WBC PRESENT, PREDOMINANTLY PMN RARE GRAM POSITIVE COCCI Performed at [facility], [place] [address], [place], [place] [address]    Culture FEW GROUP B STREP(S.AGALACTIAE)ISOLATED  Final   Report Status PENDING  Incomplete    RADIOLOGY:  Ct Maxillofacial W Contrast  Result Date: 03/06/2017 CLINICAL DATA:  RIGHT facial swelling and abscess for 1 week, on antibiotics. History of diabetes.  EXAM: CT MAXILLOFACIAL WITH CONTRAST TECHNIQUE: Multidetector CT imaging of the maxillofacial structures was performed with intravenous contrast. Multiplanar CT image reconstructions were also generated. CONTRAST:  53mL ISOVUE-300 IOPAMIDOL (ISOVUE-300) INJECTION 61% COMPARISON:  CT neck November 23, 2016 FINDINGS: OSSEOUS: The mandible is intact, the condyles are located. No acute facial fracture. No destructive bony lesions. Multiple dental carie and scattered periapical abscess. ORBITS: Ocular globes and orbital contents are normal. SINUSES: Subcentimeter LEFT maxillary mucosal retention cysts without paranasal sinus air-fluid levels. Mastoid air cells are well aerated. Nasal septum is midline. SOFT TISSUES: RIGHT facial soft tissue swelling, skin thickening with  subcutaneous fat stranding. Superimposed irregular 2.4 x 1.5 x 3.1 cm (AP by transverse by CC) abscess RIGHT superficial face. Mildly enlarged RIGHT masseter muscle. Slight RIGHT parotid ductal dilatation compatible with reactive changes. LIMITED INTRACRANIAL: Normal. IMPRESSION: 1. RIGHT facial cellulitis, 2.4 x 1.5 x 3.1 cm facial abscess. RIGHT masseter myositis. 2. Poor dentition. Electronically Signed   By: Elon Alas M.D.   On: 03/06/2017 04:40    EKG:   Orders placed or performed during the hospital encounter of 05/21/16  . EKG 12-Lead  . EKG 12-Lead  . EKG      Management plans discussed with the patient, family and they are in agreement.  CODE STATUS:     Code Status Orders        Start     Ordered   03/06/17 1005  Full code  Continuous     03/06/17 1004    Code Status History    Date Active Date Inactive Code Status Order ID Comments User Context   05/21/2016  8:04 AM 05/24/2016  5:51 PM Full Code [id number]  Saundra Shelling, MD ED    Advance Directive Documentation     Most Recent Value  Type of Advance Directive  Healthcare Power of Attorney  Pre-existing out of facility DNR order (yellow form or pink MOST form)   -  "MOST" Form in Place?  -      TOTAL TIME TAKING CARE OF THIS PATIENT: 43  minutes.   Note: This dictation was prepared with Dragon dictation along with smaller phrase technology. Any transcriptional errors that result from this process are unintentional.   @MEC @  on 03/08/2017 at 2:17 PM  Between 7am to 6pm - Pager - [phone]  After 6pm go to [email or web address] - password [email or web address] [facility]  Office  [phone]  CC: Primary care physician; [facility] [place]

## 2017-03-09 LAB — AEROBIC CULTURE W GRAM STAIN (SUPERFICIAL SPECIMEN)

## 2017-03-09 LAB — AEROBIC CULTURE  (SUPERFICIAL SPECIMEN): SPECIAL REQUESTS: NORMAL

## 2017-03-11 LAB — CULTURE, BLOOD (ROUTINE X 2)
CULTURE: NO GROWTH
Culture: NO GROWTH
SPECIAL REQUESTS: ADEQUATE
SPECIAL REQUESTS: ADEQUATE

## 2017-06-03 ENCOUNTER — Emergency Department: Payer: Self-pay

## 2017-06-03 ENCOUNTER — Emergency Department
Admission: EM | Admit: 2017-06-03 | Discharge: 2017-06-03 | Disposition: A | Discharge status: Home / Self Care | Payer: Self-pay | Attending: Emergency Medicine | Admitting: Emergency Medicine

## 2017-06-03 ENCOUNTER — Other Ambulatory Visit: Payer: Self-pay

## 2017-06-03 DIAGNOSIS — F17210 Nicotine dependence, cigarettes, uncomplicated: Secondary | ICD-10-CM | POA: Insufficient documentation

## 2017-06-03 DIAGNOSIS — Z794 Long term (current) use of insulin: Secondary | ICD-10-CM | POA: Insufficient documentation

## 2017-06-03 DIAGNOSIS — E1065 Type 1 diabetes mellitus with hyperglycemia: Secondary | ICD-10-CM | POA: Insufficient documentation

## 2017-06-03 DIAGNOSIS — Z79899 Other long term (current) drug therapy: Secondary | ICD-10-CM | POA: Insufficient documentation

## 2017-06-03 DIAGNOSIS — E860 Dehydration: Secondary | ICD-10-CM | POA: Insufficient documentation

## 2017-06-03 DIAGNOSIS — R739 Hyperglycemia, unspecified: Secondary | ICD-10-CM

## 2017-06-03 DIAGNOSIS — E1010 Type 1 diabetes mellitus with ketoacidosis without coma: Secondary | ICD-10-CM | POA: Insufficient documentation

## 2017-06-03 DIAGNOSIS — K610 Anal abscess: Secondary | ICD-10-CM | POA: Insufficient documentation

## 2017-06-03 LAB — BASIC METABOLIC PANEL
ANION GAP: 11 (ref 5–15)
BUN: 9 mg/dL (ref 6–20)
CHLORIDE: 93 mmol/L — AB (ref 101–111)
CO2: 24 mmol/L (ref 22–32)
Calcium: 8.8 mg/dL — ABNORMAL LOW (ref 8.9–10.3)
Creatinine, Ser: 0.75 mg/dL (ref 0.61–1.24)
GFR calc non Af Amer: >60 mL/min (ref >60)
Glucose, Bld: 537 mg/dL (ref 65–99)
POTASSIUM: 3.4 mmol/L — AB (ref 3.5–5.1)
Sodium: 128 mmol/L — ABNORMAL LOW (ref 135–145)

## 2017-06-03 LAB — GLUCOSE, CAPILLARY: GLUCOSE-CAPILLARY: 453 mg/dL — AB (ref 65–99)

## 2017-06-03 LAB — CBC
HCT: 35.9 % — ABNORMAL LOW (ref 40.0–52.0)
HEMOGLOBIN: 11.8 g/dL — AB (ref 13.0–18.0)
MCH: 28.3 pg (ref 26.0–34.0)
MCHC: 32.9 g/dL (ref 32.0–36.0)
MCV: 85.9 fL (ref 80.0–100.0)
PLATELETS: 194 10*3/uL (ref 150–440)
RBC: 4.18 MIL/uL — AB (ref 4.40–5.90)
RDW: 14.2 % (ref 11.5–14.5)
WBC: 19.2 10*3/uL — AB (ref 3.8–10.6)

## 2017-06-03 MED ORDER — AMOXICILLIN-POT CLAVULANATE 875-125 MG PO TABS: 1.0000 | ORAL_TABLET | Freq: Two times a day (BID) | ORAL | 0 refills | Status: DC

## 2017-06-03 MED ORDER — OXYCODONE-ACETAMINOPHEN 7.5-325 MG PO TABS: 1.0000 | ORAL_TABLET | Freq: Four times a day (QID) | ORAL | 0 refills | Status: DC | PRN

## 2017-06-03 MED ORDER — AMOXICILLIN-POT CLAVULANATE 875-125 MG PO TABS: 1.0000 | ORAL_TABLET | Freq: Once | ORAL | Status: DC

## 2017-06-03 MED ORDER — LIDOCAINE-EPINEPHRINE 1 %-1:100000 IJ SOLN
10.0000 mL | Freq: Once | INTRAMUSCULAR | Status: DC
  Filled 2017-06-03 (×2): qty 10

## 2017-06-03 MED ORDER — ONDANSETRON HCL 4 MG/2ML IJ SOLN
4.0000 mg | Freq: Once | INTRAMUSCULAR | Status: AC
  Administered 2017-06-03: 4 mg via INTRAVENOUS
  Filled 2017-06-03: qty 2

## 2017-06-03 MED ORDER — SODIUM CHLORIDE 0.9 % IV BOLUS (SEPSIS)
1000.0000 mL | Freq: Once | INTRAVENOUS | Status: AC
Start: 2017-06-03 — End: 2017-06-03
  Administered 2017-06-03: 1000 mL via INTRAVENOUS

## 2017-06-03 MED ORDER — INSULIN ASPART 100 UNIT/ML ~~LOC~~ SOLN
15.0000 [IU] | Freq: Once | SUBCUTANEOUS | Status: AC
  Administered 2017-06-03: 15 [IU] via SUBCUTANEOUS
  Filled 2017-06-03: qty 1

## 2017-06-03 MED ORDER — BUPIVACAINE HCL 0.5 % IJ SOLN
50.0000 mL | Freq: Once | INTRAMUSCULAR | Status: DC
  Filled 2017-06-03: qty 50

## 2017-06-03 MED ORDER — KETOROLAC TROMETHAMINE 30 MG/ML IJ SOLN
15.0000 mg | Freq: Once | INTRAMUSCULAR | Status: AC
  Administered 2017-06-03: 15 mg via INTRAVENOUS
  Filled 2017-06-03: qty 1

## 2017-06-03 MED ORDER — AMOXICILLIN-POT CLAVULANATE 875-125 MG PO TABS
1.0000 | ORAL_TABLET | Freq: Once | ORAL | Status: AC
  Administered 2017-06-03: 1 via ORAL
  Filled 2017-06-03: qty 1

## 2017-06-03 MED ORDER — MORPHINE SULFATE (PF) 4 MG/ML IV SOLN
6.0000 mg | Freq: Once | INTRAVENOUS | Status: AC
  Administered 2017-06-03: 6 mg via INTRAVENOUS
  Filled 2017-06-03: qty 2

## 2017-06-03 MED ORDER — BUPIVACAINE HCL (PF) 0.5 % IJ SOLN
INTRAMUSCULAR | Status: AC
  Filled 2017-06-03: qty 30

## 2017-06-03 MED ORDER — DOCUSATE SODIUM 100 MG PO CAPS: 100.0000 mg | ORAL_CAPSULE | Freq: Every day | ORAL | 0 refills | Status: DC | PRN

## 2017-06-03 NOTE — ED Provider Notes (Signed)
Care signed over from Dr. Dahlia Client pending results of ultrasound.  The patient's ultrasound shows cobblestoning but no signs of definite abscess.  I had a lengthy discussion with the patient regarding the results and I offered him inpatient admission versus outpatient management with oral antibiotics and he would prefer to be treated as an outpatient which I think is entirely reasonable.  We will give him Augmentin for 14 days as well as sitz baths twice a day.  I will also help him get surgery follow-up this week.  2-day return precautions will be given.   Darel Hong, MD 06/03/17 0800

## 2017-06-03 NOTE — ED Provider Notes (Signed)
[facility] Emergency Department Provider Note   ____________________________________________   First MD Initiated Contact with Patient 06/03/17 [phone]     (approximate)  I have reviewed the triage vital signs and the nursing notes.   HISTORY  Chief Complaint Abscess    HPI KOSEI RHODES is a 22 y.o. male who comes into the hospital today because he thinks he has an abscess between his buttocks.  The patient states that he felt a forming on Saturday.  He has had some fevers and chills.  The patient reports that he has been drinking Gatorade to try to stay hydrated.  His blood sugars he states was initially 115 but then it increased over 400.  The patient rates his pain an 8 out of 10 in intensity.  He states that he was given an antibiotic by Dr. Posey Pronto from the last time he was here and he has been taking that.  He states that he has not eaten in 2 days.  He is here for further evaluation and treatment of his symptoms.  Past Medical History:  Diagnosis Date  . Diabetes 1.5, managed as type 1 [facility])     Patient Active Problem List   Diagnosis Date Noted  . Sepsis ([place]) 03/06/2017  . Malnutrition of moderate degree 05/22/2016  . DKA (diabetic ketoacidoses) ([place]) 05/21/2016  . Type I (juvenile type) diabetes mellitus without mention of complication, uncontrolled 10/05/2010  . Hypertension 10/05/2010  . Obesity 10/05/2010    Past Surgical History:  Procedure Laterality Date  . none      Prior to Admission medications   Medication Sig Start Date End Date Taking? Authorizing Provider  acetaminophen (TYLENOL) 325 MG tablet Take 2 tablets (650 mg total) by mouth every 6 (six) hours as needed for mild pain (or Fever >/= 101). 03/08/17   Nicholes Mango, MD  docusate sodium (COLACE) 100 MG capsule Take 1 capsule (100 mg total) by mouth 2 (two) times daily as needed for mild constipation. 03/08/17   Nicholes Mango, MD  doxycycline (VIBRA-TABS) 100 MG tablet Take  100 mg by mouth 2 (two) times daily.    [provider]  insulin glargine (LANTUS) 100 UNIT/ML injection Inject 0.12 mLs (12 Units total) into the skin daily. 03/09/17   Gouru, Illene Silver, MD  insulin lispro (HUMALOG) 100 UNIT/ML injection 5 units 3 times a day with meals 03/08/17 03/08/18  Nicholes Mango, MD  Multiple Vitamin (MULTIVITAMIN WITH MINERALS) TABS tablet Take 1 tablet by mouth daily. 03/09/17   Gouru, Illene Silver, MD  oxyCODONE (OXY IR/ROXICODONE) 5 MG immediate release tablet Take 1 tablet (5 mg total) by mouth every 6 (six) hours as needed for moderate pain or breakthrough pain. 03/08/17   Nicholes Mango, MD    Allergies Patient has no known allergies.  Family History  Problem Relation Age of Onset  . Diabetes Mother     Social History Social History   Tobacco Use  . Smoking status: Current Every Day Smoker    Packs/day: 0.50    Types: Cigarettes  . Smokeless tobacco: Never Used  Substance Use Topics  . Alcohol use: No  . Drug use: Yes    Types: Marijuana    Review of Systems  Constitutional:  fever/chills Eyes: No visual changes. ENT: No sore throat. Cardiovascular: Denies chest pain. Respiratory: Denies shortness of breath. Gastrointestinal: No abdominal pain.  No nausea, no vomiting.  No diarrhea.  No constipation. Genitourinary: Negative for dysuria. Musculoskeletal: Right-sided back pain. Skin: gluteal  abscess Neurological: Negative for headaches, focal weakness or numbness.   ____________________________________________   PHYSICAL EXAM:  VITAL SIGNS: ED Triage Vitals [06/03/17 0616]  Enc Vitals Group     BP 108/68     Pulse Rate (!) 126     Resp 20     Temp 99.6 F (37.6 C)     Temp Source Oral     SpO2 100 %     Weight 137 lb (62.1 kg)     Height 5\' 9"  (1.753 m)     Head Circumference      Peak Flow      Pain Score 9     Pain Loc      Pain Edu?      Excl. in [place]?     Constitutional: Alert and oriented. Well appearing and in moderate  distress. Eyes: Conjunctivae are normal. PERRL. EOMI. Head: Atraumatic. Nose: No congestion/rhinnorhea. Mouth/Throat: Mucous membranes are moist.  Oropharynx non-erythematous. Cardiovascular: Normal rate, regular rhythm. Grossly normal heart sounds.  Good peripheral circulation. Respiratory: Normal respiratory effort.  No retractions. Lungs CTAB. Gastrointestinal: Soft and nontender. No distention. Positive bowel sounds Musculoskeletal: No lower extremity tenderness nor edema.   Neurologic:  Normal speech and language.  Skin:  Skin is warm, dry and intact. Tenderness to palpation and some swelling to the right gluteal verge no swelling into the rectum. Psychiatric: Mood and affect are normal.   ____________________________________________   LABS (all labs ordered are listed, but only abnormal results are displayed)  Labs Reviewed  CBC - Abnormal; Notable for the following components:      Result Value   WBC 19.2 (*)    RBC 4.18 (*)    Hemoglobin 11.8 (*)    HCT 35.9 (*)    All other components within normal limits  GLUCOSE, CAPILLARY - Abnormal; Notable for the following components:   Glucose-Capillary 453 (*)    All other components within normal limits  CULTURE, BLOOD (ROUTINE X 2)  CULTURE, BLOOD (ROUTINE X 2)  BASIC METABOLIC PANEL  CBG MONITORING, ED   ____________________________________________  EKG  none ____________________________________________  RADIOLOGY  No results found.  ____________________________________________   PROCEDURES  Procedure(s) performed: None  Procedures  Critical Care performed: No  ____________________________________________   INITIAL IMPRESSION / ASSESSMENT AND PLAN / ED COURSE  As part of my medical decision making, I reviewed the following data within the electronic MEDICAL RECORD NUMBER Notes from prior ED visits and [place] Controlled Substance Database   This is a 22 year old male who comes into the hospital today with a  possible abscess to his buttock.  The patient states that he has been having fevers and chills and some pain.  My differential diagnosis includes abscess, cellulitis  The last time the patient was seen in the emergency department he was septic and had facial cellulitis.  We will check some blood work on the patient and give him a liter of normal saline as his heart rate is 126.  I will send the patient for an ultrasound to see if there is a fluid collection in this area or if this is a cellulitis.  The patient's care will be signed out to Dr. Mable Paris who will follow up the results of the [place] and the bloodwork.       ____________________________________________   FINAL CLINICAL IMPRESSION(S) / ED DIAGNOSES  Final diagnoses:  Abscess     ED Discharge Orders    None       Note:  This document was prepared using Dragon voice recognition software and may include unintentional dictation errors.    Loney Hering, MD 06/03/17 [phone]

## 2017-06-03 NOTE — ED Triage Notes (Signed)
Noticed area on right buttock at "crack" since Saturday.

## 2017-06-03 NOTE — ED Notes (Signed)
Patient transported to Ultrasound 

## 2017-06-03 NOTE — Discharge Instructions (Signed)
Please take all of your antibiotics as prescribed and make sure you use a sitz bath 2-3 times a day.  Follow-up with the general surgeon in 2 days for reevaluation and return to the emergency department sooner for any new or worsening symptoms such as fevers, chills, worsening pain, or for any other issues whatsoever.  It was a pleasure to take care of you today, and thank you for coming to our emergency department.  If you have any questions or concerns before leaving please ask the nurse to grab me and I'm more than happy to go through your aftercare instructions again.  If you were prescribed any opioid pain medication today such as Norco, Vicodin, Percocet, morphine, hydrocodone, or oxycodone please make sure you do not drive when you are taking this medication as it can alter your ability to drive safely.  If you have any concerns once you are home that you are not improving or are in fact getting worse before you can make it to your follow-up appointment, please do not hesitate to call 911 and come back for further evaluation.  Darel Hong, MD  Results for orders placed or performed during the hospital encounter of 06/03/17  CBC  Result Value Ref Range   WBC 19.2 (H) 3.8 - 10.6 K/uL   RBC 4.18 (L) 4.40 - 5.90 MIL/uL   Hemoglobin 11.8 (L) 13.0 - 18.0 g/dL   HCT 35.9 (L) 40.0 - 52.0 %   MCV 85.9 80.0 - 100.0 fL   MCH 28.3 26.0 - 34.0 pg   MCHC 32.9 32.0 - 36.0 g/dL   RDW 14.2 11.5 - 14.5 %   Platelets 194 150 - 440 K/uL  Basic metabolic panel  Result Value Ref Range   Sodium 128 (L) 135 - 145 mmol/L   Potassium 3.4 (L) 3.5 - 5.1 mmol/L   Chloride 93 (L) 101 - 111 mmol/L   CO2 24 22 - 32 mmol/L   Glucose, Bld 537 (HH) 65 - 99 mg/dL   BUN 9 6 - 20 mg/dL   Creatinine, Ser 0.75 0.61 - 1.24 mg/dL   Calcium 8.8 (L) 8.9 - 10.3 mg/dL   GFR calc non Af Amer >60 >60 mL/min   GFR calc Af Amer >60 >60 mL/min   Anion gap 11 5 - 15  Glucose, capillary  Result Value Ref Range   Glucose-Capillary 453 (H) 65 - 99 mg/dL   US Pelvis Limited  Result Date: 06/03/2017 CLINICAL DATA:  22 year old male with history of pain in the right buttock for the past 2 days. Evaluate for potential abscess. EXAM: LIMITED ULTRASOUND OF PELVIS TECHNIQUE: Limited transabdominal ultrasound examination of the pelvis was performed. COMPARISON:  None. FINDINGS: Focused ultrasound examination in the area of concern demonstrates no well-defined fluid collection to suggest abscess. Diffuse soft tissue edema was noted. IMPRESSION: 1. Diffuse soft tissue edema without definite abscess noted in the area of concern in the medial right buttock. Electronically Signed   By: Vinnie Langton M.D.   On: 06/03/2017 07:49

## 2017-06-03 NOTE — ED Notes (Signed)
Report to stephanie, rn

## 2017-06-04 ENCOUNTER — Telehealth: Payer: Self-pay | Admitting: General Practice

## 2017-06-04 NOTE — Telephone Encounter (Signed)
-----   Message from Mickie Kay sent at 06/04/2017 11:14 AM EST ----- Regarding: ED follow up-New patient Please call patient to make an appt with any surgeon for possible abscess to his buttock. Patient was seen in ED on 06/03/17.

## 2017-06-04 NOTE — Telephone Encounter (Signed)
Called made to the patient, patient stated on the phone he would be a self pay patient, I offer the patient the [place] financial assistance paperwork, patient said he would fill paperwork out and have it mailed back in and then would decide on treatment. Patient was also notify if the abscess was to get worse to call the office or go to the ED.

## 2017-06-08 LAB — CULTURE, BLOOD (ROUTINE X 2)
CULTURE: NO GROWTH
Culture: NO GROWTH
Special Requests: ADEQUATE
Special Requests: ADEQUATE

## 2017-06-16 ENCOUNTER — Encounter
Admission: EM | Disposition: A | Discharge status: Home / Self Care | Payer: Self-pay | Source: Home / Self Care | Attending: Internal Medicine

## 2017-06-16 ENCOUNTER — Emergency Department: Payer: Self-pay

## 2017-06-16 ENCOUNTER — Other Ambulatory Visit: Payer: Self-pay

## 2017-06-16 ENCOUNTER — Inpatient Hospital Stay: Payer: Self-pay | Admitting: Anesthesiology

## 2017-06-16 ENCOUNTER — Inpatient Hospital Stay
Admission: EM | Admit: 2017-06-16 | Discharge: 2017-06-18 | DRG: 330 | Disposition: A | Discharge status: Home / Self Care | Payer: Self-pay | Attending: Surgery | Admitting: Surgery

## 2017-06-16 DIAGNOSIS — K611 Rectal abscess: Secondary | ICD-10-CM

## 2017-06-16 DIAGNOSIS — E876 Hypokalemia: Secondary | ICD-10-CM | POA: Diagnosis present

## 2017-06-16 DIAGNOSIS — Z833 Family history of diabetes mellitus: Secondary | ICD-10-CM

## 2017-06-16 DIAGNOSIS — Z9119 Patient's noncompliance with other medical treatment and regimen: Secondary | ICD-10-CM

## 2017-06-16 DIAGNOSIS — F17210 Nicotine dependence, cigarettes, uncomplicated: Secondary | ICD-10-CM | POA: Diagnosis present

## 2017-06-16 DIAGNOSIS — E871 Hypo-osmolality and hyponatremia: Secondary | ICD-10-CM | POA: Diagnosis present

## 2017-06-16 DIAGNOSIS — Z794 Long term (current) use of insulin: Secondary | ICD-10-CM

## 2017-06-16 DIAGNOSIS — E109 Type 1 diabetes mellitus without complications: Secondary | ICD-10-CM | POA: Diagnosis present

## 2017-06-16 HISTORY — DX: Rectal abscess: K61.1

## 2017-06-16 HISTORY — PX: INCISION AND DRAINAGE PERIRECTAL ABSCESS: SHX1804

## 2017-06-16 LAB — LACTIC ACID, PLASMA: LACTIC ACID, VENOUS: 1.5 mmol/L (ref 0.5–1.9)

## 2017-06-16 LAB — GLUCOSE, CAPILLARY
GLUCOSE-CAPILLARY: 77 mg/dL (ref 65–99)
GLUCOSE-CAPILLARY: 306 mg/dL — AB (ref 65–99)
GLUCOSE-CAPILLARY: 232 mg/dL — AB (ref 65–99)
Glucose-Capillary: 373 mg/dL — ABNORMAL HIGH (ref 65–99)

## 2017-06-16 LAB — BASIC METABOLIC PANEL
Anion gap: 9 (ref 5–15)
BUN: <5 mg/dL — ABNORMAL LOW (ref 6–20)
CALCIUM: 8.3 mg/dL — AB (ref 8.9–10.3)
CHLORIDE: 96 mmol/L — AB (ref 101–111)
CO2: 26 mmol/L (ref 22–32)
CREATININE: 0.48 mg/dL — AB (ref 0.61–1.24)
GFR calc Af Amer: >60 mL/min (ref >60)
Glucose, Bld: 420 mg/dL — ABNORMAL HIGH (ref 65–99)
Potassium: 3.2 mmol/L — ABNORMAL LOW (ref 3.5–5.1)
SODIUM: 131 mmol/L — AB (ref 135–145)

## 2017-06-16 LAB — CBC
HCT: 29.2 % — ABNORMAL LOW (ref 40.0–52.0)
HEMOGLOBIN: 10 g/dL — AB (ref 13.0–18.0)
MCH: 28.6 pg (ref 26.0–34.0)
MCHC: 34.1 g/dL (ref 32.0–36.0)
MCV: 83.9 fL (ref 80.0–100.0)
PLATELETS: 634 10*3/uL — AB (ref 150–440)
RBC: 3.48 MIL/uL — ABNORMAL LOW (ref 4.40–5.90)
RDW: 14.2 % (ref 11.5–14.5)
WBC: 9.3 10*3/uL (ref 3.8–10.6)

## 2017-06-16 SURGERY — INCISION AND DRAINAGE, ABSCESS, PERIRECTAL
Anesthesia: General

## 2017-06-16 MED ORDER — ROCURONIUM BROMIDE 50 MG/5ML IV SOLN
INTRAVENOUS | Status: AC
  Filled 2017-06-16: qty 1

## 2017-06-16 MED ORDER — INSULIN ASPART 100 UNIT/ML ~~LOC~~ SOLN
SUBCUTANEOUS | Status: DC | PRN
  Administered 2017-06-16: 7 [IU] via SUBCUTANEOUS

## 2017-06-16 MED ORDER — PIPERACILLIN-TAZOBACTAM 3.375 G IVPB 30 MIN
3.3750 g | Freq: Once | INTRAVENOUS | Status: AC
  Administered 2017-06-16: 3.375 g via INTRAVENOUS

## 2017-06-16 MED ORDER — MIDAZOLAM HCL 2 MG/2ML IJ SOLN
INTRAMUSCULAR | Status: AC
  Filled 2017-06-16: qty 2

## 2017-06-16 MED ORDER — DEXAMETHASONE SODIUM PHOSPHATE 10 MG/ML IJ SOLN
INTRAMUSCULAR | Status: AC
  Filled 2017-06-16: qty 1

## 2017-06-16 MED ORDER — PIPERACILLIN-TAZOBACTAM 3.375 G IVPB
3.3750 g | Freq: Three times a day (TID) | INTRAVENOUS | Status: DC
Start: 2017-06-16 — End: 2017-06-17
  Filled 2017-06-16 (×2): qty 50

## 2017-06-16 MED ORDER — LIDOCAINE HCL (CARDIAC) 20 MG/ML IV SOLN
INTRAVENOUS | Status: DC | PRN
  Administered 2017-06-16: 60 mg via INTRAVENOUS

## 2017-06-16 MED ORDER — IOPAMIDOL (ISOVUE-300) INJECTION 61%
30.0000 mL | Freq: Once | INTRAVENOUS | Status: DC | PRN
  Filled 2017-06-16: qty 30

## 2017-06-16 MED ORDER — INSULIN ASPART 100 UNIT/ML ~~LOC~~ SOLN
7.0000 [IU] | Freq: Once | SUBCUTANEOUS | Status: AC
  Administered 2017-06-16: 7 [IU] via SUBCUTANEOUS

## 2017-06-16 MED ORDER — SUCCINYLCHOLINE CHLORIDE 20 MG/ML IJ SOLN
INTRAMUSCULAR | Status: AC
  Filled 2017-06-16: qty 1

## 2017-06-16 MED ORDER — ONDANSETRON HCL 4 MG/2ML IJ SOLN
INTRAMUSCULAR | Status: AC
  Filled 2017-06-16: qty 2

## 2017-06-16 MED ORDER — INSULIN GLARGINE 100 UNIT/ML ~~LOC~~ SOLN
6.0000 [IU] | Freq: Every day | SUBCUTANEOUS | Status: DC
  Filled 2017-06-16: qty 0.06

## 2017-06-16 MED ORDER — ONDANSETRON HCL 4 MG/2ML IJ SOLN: 4.0000 mg | Freq: Once | INTRAMUSCULAR | Status: DC | PRN

## 2017-06-16 MED ORDER — CLINDAMYCIN PHOSPHATE 600 MG/50ML IV SOLN
600.0000 mg | Freq: Once | INTRAVENOUS | Status: AC
  Administered 2017-06-16: 600 mg via INTRAVENOUS
  Filled 2017-06-16: qty 50

## 2017-06-16 MED ORDER — INSULIN ASPART 100 UNIT/ML ~~LOC~~ SOLN
0.0000 [IU] | Freq: Three times a day (TID) | SUBCUTANEOUS | Status: DC
  Administered 2017-06-17 (×2): 5 [IU] via SUBCUTANEOUS
  Administered 2017-06-18: 3 [IU] via SUBCUTANEOUS
  Filled 2017-06-16 (×4): qty 1

## 2017-06-16 MED ORDER — PROPOFOL 10 MG/ML IV BOLUS
INTRAVENOUS | Status: DC | PRN
  Administered 2017-06-16: 130 mg via INTRAVENOUS

## 2017-06-16 MED ORDER — PROPOFOL 10 MG/ML IV BOLUS
INTRAVENOUS | Status: AC
  Filled 2017-06-16: qty 20

## 2017-06-16 MED ORDER — IOPAMIDOL (ISOVUE-300) INJECTION 61%
30.0000 mL | Freq: Once | INTRAVENOUS | Status: AC | PRN
Start: 2017-06-16 — End: 2017-06-16
  Administered 2017-06-16: 30 mL
  Filled 2017-06-16: qty 30

## 2017-06-16 MED ORDER — INSULIN ASPART 100 UNIT/ML ~~LOC~~ SOLN: 0.0000 [IU] | Freq: Four times a day (QID) | SUBCUTANEOUS | Status: DC

## 2017-06-16 MED ORDER — ONDANSETRON HCL 4 MG PO TABS: 4.0000 mg | ORAL_TABLET | Freq: Four times a day (QID) | ORAL | Status: DC | PRN

## 2017-06-16 MED ORDER — ONDANSETRON HCL 4 MG/2ML IJ SOLN
4.0000 mg | Freq: Four times a day (QID) | INTRAMUSCULAR | Status: DC | PRN
  Administered 2017-06-16: 4 mg via INTRAVENOUS
  Filled 2017-06-16: qty 2

## 2017-06-16 MED ORDER — MORPHINE SULFATE (PF) 2 MG/ML IV SOLN
2.0000 mg | INTRAVENOUS | Status: DC | PRN
  Administered 2017-06-16 – 2017-06-18 (×10): 2 mg via INTRAVENOUS
  Filled 2017-06-16 (×12): qty 1

## 2017-06-16 MED ORDER — SODIUM CHLORIDE 0.9 % IV BOLUS (SEPSIS)
1000.0000 mL | Freq: Once | INTRAVENOUS | Status: AC
  Administered 2017-06-16: 1000 mL via INTRAVENOUS

## 2017-06-16 MED ORDER — PIPERACILLIN-TAZOBACTAM 3.375 G IVPB
3.3750 g | Freq: Three times a day (TID) | INTRAVENOUS | Status: DC
  Administered 2017-06-16 – 2017-06-18 (×5): 3.375 g via INTRAVENOUS
  Filled 2017-06-16 (×3): qty 50

## 2017-06-16 MED ORDER — INSULIN ASPART 100 UNIT/ML ~~LOC~~ SOLN
0.0000 [IU] | Freq: Every day | SUBCUTANEOUS | Status: DC
  Administered 2017-06-16: 2 [IU] via SUBCUTANEOUS
  Filled 2017-06-16 (×2): qty 1

## 2017-06-16 MED ORDER — FENTANYL CITRATE (PF) 100 MCG/2ML IJ SOLN
INTRAMUSCULAR | Status: AC
  Filled 2017-06-16: qty 2

## 2017-06-16 MED ORDER — LACTATED RINGERS IV SOLN
INTRAVENOUS | Status: DC
  Administered 2017-06-16 – 2017-06-17 (×3): via INTRAVENOUS

## 2017-06-16 MED ORDER — PHENYLEPHRINE HCL 10 MG/ML IJ SOLN
INTRAMUSCULAR | Status: DC | PRN
  Administered 2017-06-16: 50 ug via INTRAVENOUS
  Administered 2017-06-16: 100 ug via INTRAVENOUS

## 2017-06-16 MED ORDER — PIPERACILLIN-TAZOBACTAM 3.375 G IVPB
INTRAVENOUS | Status: AC
  Filled 2017-06-16: qty 50

## 2017-06-16 MED ORDER — INSULIN GLARGINE 100 UNIT/ML ~~LOC~~ SOLN
10.0000 [IU] | Freq: Every day | SUBCUTANEOUS | Status: DC
  Filled 2017-06-16 (×2): qty 0.1

## 2017-06-16 MED ORDER — IOPAMIDOL (ISOVUE-300) INJECTION 61%
100.0000 mL | Freq: Once | INTRAVENOUS | Status: AC | PRN
  Administered 2017-06-16: 100 mL via INTRAVENOUS
  Filled 2017-06-16: qty 100

## 2017-06-16 MED ORDER — FENTANYL CITRATE (PF) 100 MCG/2ML IJ SOLN: 25.0000 ug | INTRAMUSCULAR | Status: DC | PRN

## 2017-06-16 MED ORDER — HEPARIN SODIUM (PORCINE) 5000 UNIT/ML IJ SOLN
5000.0000 [IU] | Freq: Three times a day (TID) | INTRAMUSCULAR | Status: DC
  Administered 2017-06-17 – 2017-06-18 (×3): 5000 [IU] via SUBCUTANEOUS
  Filled 2017-06-16 (×4): qty 1

## 2017-06-16 MED ORDER — FENTANYL CITRATE (PF) 100 MCG/2ML IJ SOLN
INTRAMUSCULAR | Status: DC | PRN
  Administered 2017-06-16 (×4): 25 ug via INTRAVENOUS

## 2017-06-16 MED ORDER — OXYCODONE-ACETAMINOPHEN 5-325 MG PO TABS
1.0000 | ORAL_TABLET | ORAL | Status: DC | PRN
Start: 2017-06-16 — End: 2017-06-18
  Administered 2017-06-17 (×2): 1 via ORAL
  Filled 2017-06-16 (×3): qty 1

## 2017-06-16 MED ORDER — ONDANSETRON HCL 4 MG/2ML IJ SOLN
INTRAMUSCULAR | Status: DC | PRN
  Administered 2017-06-16: 4 mg via INTRAVENOUS

## 2017-06-16 SURGICAL SUPPLY — 23 items
BLADE SURG 15 STRL LF DISP TIS (BLADE) ×1 IMPLANT
BLADE SURG 15 STRL SS (BLADE) ×3
BLADE SURG SZ11 CARB STEEL (BLADE) ×3 IMPLANT
BRIEF STRETCH MATERNITY 2XLG (MISCELLANEOUS) ×3 IMPLANT
CANISTER SUCT 1200ML W/VALVE (MISCELLANEOUS) ×3 IMPLANT
DRAIN PENROSE 1/4X12 LTX (DRAIN) ×3 IMPLANT
DRAPE LAPAROTOMY 100X77 ABD (DRAPES) ×3 IMPLANT
DRAPE LEGGINS SURG 28X43 STRL (DRAPES) ×3 IMPLANT
DRAPE UNDER BUTTOCK W/FLU (DRAPES) ×3 IMPLANT
GAUZE PACKING IODOFORM 1/2 (PACKING) ×3 IMPLANT
GAUZE SPONGE 4X4 12PLY STRL (GAUZE/BANDAGES/DRESSINGS) ×3 IMPLANT
GLOVE BIO SURGEON STRL SZ8 (GLOVE) ×6 IMPLANT
GOWN STRL REUS W/ TWL LRG LVL3 (GOWN DISPOSABLE) ×2 IMPLANT
GOWN STRL REUS W/TWL LRG LVL3 (GOWN DISPOSABLE) ×6
KIT TURNOVER KIT A (KITS) ×3 IMPLANT
LABEL OR SOLS (LABEL) ×3 IMPLANT
NS IRRIG 500ML POUR BTL (IV SOLUTION) ×3 IMPLANT
PACK BASIN MINOR ARMC (MISCELLANEOUS) ×3 IMPLANT
PAD ABD DERMACEA PRESS 5X9 (GAUZE/BANDAGES/DRESSINGS) ×6 IMPLANT
PREP PVP WINGED SPONGE (MISCELLANEOUS) ×3 IMPLANT
SUT ETH BLK MONO 3 0 FS 1 12/B (SUTURE) ×3 IMPLANT
SUT NYLON 2-0 (SUTURE) ×3 IMPLANT
SWAB CULTURE AMIES ANAERIB BLU (MISCELLANEOUS) ×3 IMPLANT

## 2017-06-16 NOTE — Anesthesia Post-op Follow-up Note (Signed)
Anesthesia QCDR form completed.        

## 2017-06-16 NOTE — ED Notes (Signed)
Dr Burt Knack in with pt

## 2017-06-16 NOTE — Transfer of Care (Signed)
Immediate Anesthesia Transfer of Care Note  Patient: Jonathan Mcmillan  Procedure(s) Performed: IRRIGATION AND DEBRIDEMENT PERIRECTAL ABSCESS (N/A )  Patient Location: PACU  Anesthesia Type:General  Level of Consciousness: sedated  Airway & Oxygen Therapy: Patient Spontanous Breathing and Patient connected to face mask oxygen  Post-op Assessment: Report given to RN and Post -op Vital signs reviewed and stable  Post vital signs: Reviewed and stable  Last Vitals:  Vitals:   06/16/17 1140 06/16/17 1322  BP: (!) 143/88 111/63  Pulse: 100 86  Resp: 20 17  Temp: 37.1 C 36.7 C  SpO2: 100% 100%    Last Pain:  Vitals:   06/16/17 1322  TempSrc:   PainSc: Asleep         Complications: No apparent anesthesia complications

## 2017-06-16 NOTE — H&P (Signed)
Jonathan Mcmillan is an 22 y.o. male.    Chief Complaint: Buttock pain and drainage  HPI: Patient is a very poor historian and prefers to keep the sheet over his face not answering questions very directly.  He has a apparent spontaneously draining perirectal abscess that he is unclear as to when it started.  Apparently he has been given amoxicillin and doxycycline in the past but has not taken those medications as prescribed.  He does not answer questions concerning fevers or chills but has drainage and pain.  Review of systems is not possible due to the patient's somewhat noncompliance.  He is diabetic.  Family history is not obtainable.  Past Medical History:  Diagnosis Date  . Diabetes 1.5, managed as type 1 [facility])     Past Surgical History:  Procedure Laterality Date  . none      Family History  Problem Relation Age of Onset  . Diabetes Mother    Social History:  reports that he has been smoking cigarettes.  He has been smoking about 0.50 packs per day. he has never used smokeless tobacco. He reports that he uses drugs. Drug: Marijuana. He reports that he does not drink alcohol.  Allergies: No Known Allergies   (Not in a hospital admission)   Review of Systems  Unable to perform ROS: Patient unresponsive  Reason for not being able to obtain the review of systems as the patient does not answer questions reliably.   Physical Exam:  BP (!) 143/88 (BP Location: Right Arm)   Pulse 100   Temp 98.8 F (37.1 C) (Oral)   Resp 20   Ht 5' 8"  (1.727 m)   Wt 135 lb (61.2 kg)   SpO2 100%   BMI 20.53 kg/m   Physical Exam  Constitutional: He is oriented to person, place, and time and well-developed, well-nourished, and in no distress. No distress.  Low-grade temp no signs with tachycardia as well  HENT:  Head: Normocephalic and atraumatic.  Eyes: Pupils are equal, round, and reactive to light. Right eye exhibits no discharge. Left eye exhibits no discharge. No scleral icterus.   Neck: Normal range of motion.  Cardiovascular: Normal rate, regular rhythm and normal heart sounds.  Pulmonary/Chest: Effort normal. No respiratory distress. He has no wheezes. He has no rales.  Abdominal: Soft. He exhibits no distension. There is no tenderness.  Musculoskeletal: Normal range of motion. He exhibits no edema or tenderness.  Lymphadenopathy:    He has no cervical adenopathy.  Neurological: He is alert and oriented to person, place, and time.  Skin: Skin is warm. He is not diaphoretic. There is erythema.  Perirectal edema and induration with tenderness and spontaneous drainage.  Vitals reviewed.       Results for orders placed or performed during the hospital encounter of 06/16/17 (from the past 48 hour(s))  CBC     Status: Abnormal   Collection Time: 06/16/17  8:29 AM  Result Value Ref Range   WBC 9.3 3.8 - 10.6 K/uL   RBC 3.48 (L) 4.40 - 5.90 MIL/uL   Hemoglobin 10.0 (L) 13.0 - 18.0 g/dL   HCT 29.2 (L) 40.0 - 52.0 %   MCV 83.9 80.0 - 100.0 fL   MCH 28.6 26.0 - 34.0 pg   MCHC 34.1 32.0 - 36.0 g/dL   RDW 14.2 11.5 - 14.5 %   Platelets 634 (H) 150 - 440 K/uL    Comment: Performed at [facility], [address] [place],  [place] [address]  Basic metabolic panel     Status: Abnormal   Collection Time: 06/16/17  8:29 AM  Result Value Ref Range   Sodium 131 (L) 135 - 145 mmol/L   Potassium 3.2 (L) 3.5 - 5.1 mmol/L   Chloride 96 (L) 101 - 111 mmol/L   CO2 26 22 - 32 mmol/L   Glucose, Bld 420 (H) 65 - 99 mg/dL   BUN <5 (L) 6 - 20 mg/dL   Creatinine, Ser 0.48 (L) 0.61 - 1.24 mg/dL   Calcium 8.3 (L) 8.9 - 10.3 mg/dL   GFR calc non Af Amer >60 >60 mL/min   GFR calc Af Amer >60 >60 mL/min    Comment: (NOTE) The eGFR has been calculated using the CKD EPI equation. This calculation has not been validated in all clinical situations. eGFR's persistently <60 mL/min signify possible Chronic Kidney Disease.    Anion gap 9 5 - 15    Comment: Performed at  [facility], [place], [place] [address]  Lactic acid, plasma     Status: None   Collection Time: 06/16/17  8:29 AM  Result Value Ref Range   Lactic Acid, Venous 1.5 0.5 - 1.9 mmol/L    Comment: Performed at [facility], [address] [place], [place] [address]   Ct Pelvis W Contrast  Result Date: 06/16/2017 CLINICAL DATA:  Perirectal abscess, evaluate for fistula EXAM: CT PELVIS WITH CONTRAST TECHNIQUE: Multidetector CT imaging of the pelvis was performed using the standard protocol following the bolus administration of intravenous contrast. CONTRAST:  100 mL Isovue 300 IV COMPARISON:  None. FINDINGS: Urinary Tract:  Bladder is within normal limits. Bowel: Rectal contrast has been administered. Perirectal fluid along the right posterolateral aspect of the anorectal region (series 3/image 97), extending into the medial right gluteal region/buttock, suggesting anorectal fistula with perirectal abscess. However, a discrete fistulous tract is not evident on CT. Linear extraluminal contrast is present on the current study (series 3/image 114), but this reflects contaminant, likely related to removal of the rectal tube. Vascular/Lymphatic: No evidence of aneurysm. No suspicious pelvic lymphadenopathy. Reproductive:  Prostate is unremarkable. Other:  Small volume pelvic ascites. Musculoskeletal: Gas and fluid collection in the right medial gluteal/buttock region, measuring 2.8 x 6.4 cm in maximal axial dimension (series 3/image 123), at which level this reflects predominantly gas. Superiorly, this reflects fluid tracking medially towards the anorectal region (series 3/images 97, 101, and 108). The full craniocaudal extent of the abnormality is approximately 7.1 cm (sagittal image 74). IMPRESSION: 2.8 x 6.4 cm gas and fluid collection in the right medial gluteal/buttock region, with additional perirectal fluid along the posterolateral aspect of the anorectal junction. 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 However, a discrete fistulous tract is not evident on CT. The gas present in the soft tissues is presumed to be secondary to intervention and/or fistulous communication with rectum. Notably, soft tissue gas was likely present at the time of prior ultrasound on 06/03/2017, which supports this presumption. However, if there is clinical concern for necrotizing infection, consider emergent surgical evaluation. These results were called by telephone at the time of interpretation on 06/16/2017 at 10:36 am to [facility], who verbally acknowledged these results. Electronically Signed   By: Julian Hy M.D.   On: 06/16/2017 10:37     Assessment/Plan  Labs personally reviewed CT scan personally reviewed there is a large fluid and gas collection in the buttock.  This patient with a  large perirectal abscess.  It is spontaneously drained but he has a large undrained collection.  I have recommended operative drainage at this time.  The patient is a not a very good historian and seems perturbed at having to answer questions.  He also does not seem to appreciate the severity of his illness in relation to his.  He argued that this should be a outpatient procedure.  I have recommended and emphasized for him that this is a serious condition especially with his diabetes and an undrained fluid collection could be quite serious.  I recommended operative intervention at this time in the form of an incision and drainage.  I discussed with him the rationale for this risk of an open wound of the risk of recurrence he understood and seemed to agree with this plan.  Florene Glen, MD, FACS

## 2017-06-16 NOTE — ED Provider Notes (Signed)
[facility] Emergency Department Provider Note  ____________________________________________  Time seen: Approximately 10:57 AM  I have reviewed the triage vital signs and the nursing notes.   HISTORY  Chief Complaint Abscess    HPI Jonathan Mcmillan is a 22 y.o. male with uncontrolled type 1 diabetes that presents to the emergency department for evaluation of worsening abscess to rectum.  Patient was seen in the emergency department 2 days ago and was given a prescription for Augmentin.  He estimates he has about 10 pills left.  He saw his primary care provider 3 days ago and was told that it appears to be improving.  Pain and drainage worsened the following day.  He is currently noticing more blood than purulent drainage. He has had diarrhea this week. He used Lantus this morning.  He had pork grinds and a cherry Coke at 2 AM.  He denies fever, chills, abdominal pain.   Past Medical History:  Diagnosis Date  . Diabetes 1.5, managed as type 1 [facility])     Patient Active Problem List   Diagnosis Date Noted  . Perirectal abscess 06/16/2017  . Sepsis ([place]) 03/06/2017  . Malnutrition of moderate degree 05/22/2016  . DKA (diabetic ketoacidoses) ([place]) 05/21/2016  . Type I (juvenile type) diabetes mellitus without mention of complication, uncontrolled 10/05/2010  . Hypertension 10/05/2010  . Obesity 10/05/2010    Past Surgical History:  Procedure Laterality Date  . none      Prior to Admission medications   Medication Sig Start Date End Date Taking? Authorizing Provider  acetaminophen (TYLENOL) 325 MG tablet Take 2 tablets (650 mg total) by mouth every 6 (six) hours as needed for mild pain (or Fever >/= 101). 03/08/17   Nicholes Mango, MD  amoxicillin-clavulanate (AUGMENTIN) 875-125 MG tablet Take 1 tablet by mouth 2 (two) times daily for 14 days. 06/03/17 06/17/17  Darel Hong, MD  docusate sodium (COLACE) 100 MG capsule Take 1 capsule (100 mg total) by mouth 2  (two) times daily as needed for mild constipation. 03/08/17   Nicholes Mango, MD  docusate sodium (COLACE) 100 MG capsule Take 1 capsule (100 mg total) by mouth daily as needed. 06/03/17 06/03/18  Darel Hong, MD  doxycycline (VIBRA-TABS) 100 MG tablet Take 100 mg by mouth 2 (two) times daily.    [provider]  insulin glargine (LANTUS) 100 UNIT/ML injection Inject 0.12 mLs (12 Units total) into the skin daily. 03/09/17   Gouru, Illene Silver, MD  insulin lispro (HUMALOG) 100 UNIT/ML injection 5 units 3 times a day with meals 03/08/17 03/08/18  Nicholes Mango, MD  Multiple Vitamin (MULTIVITAMIN WITH MINERALS) TABS tablet Take 1 tablet by mouth daily. 03/09/17   Gouru, Illene Silver, MD  oxyCODONE (OXY IR/ROXICODONE) 5 MG immediate release tablet Take 1 tablet (5 mg total) by mouth every 6 (six) hours as needed for moderate pain or breakthrough pain. 03/08/17   Nicholes Mango, MD  oxyCODONE-acetaminophen (PERCOCET) 7.5-325 MG tablet Take 1 tablet by mouth every 6 (six) hours as needed for severe pain. 06/03/17 06/03/18  Darel Hong, MD    Allergies Patient has no known allergies.  Family History  Problem Relation Age of Onset  . Diabetes Mother     Social History Social History   Tobacco Use  . Smoking status: Current Every Day Smoker    Packs/day: 0.50    Types: Cigarettes  . Smokeless tobacco: Never Used  Substance Use Topics  . Alcohol use: No  . Drug use: Yes  Types: Marijuana     Review of Systems  Constitutional: No fever/chills Cardiovascular: No chest pain. Respiratory:  No SOB. Gastrointestinal: No abdominal pain.  No vomiting.  Musculoskeletal: Negative for musculoskeletal pain. Skin: Negative for abrasions, lacerations, ecchymosis.  ____________________________________________   PHYSICAL EXAM:  VITAL SIGNS: ED Triage Vitals  Enc Vitals Group     BP 06/16/17 0145 112/68     Pulse Rate 06/16/17 0145 (!) 112     Resp 06/16/17 0145 18     Temp 06/16/17 0145 99.3  F (37.4 C)     Temp Source 06/16/17 0145 Oral     SpO2 06/16/17 0145 100 %     Weight 06/16/17 0424 135 lb (61.2 kg)     Height 06/16/17 0424 5\' 8"  (1.727 m)     Head Circumference --      Peak Flow --      Pain Score 06/16/17 0145 8     Pain Loc --      Pain Edu? --      Excl. in [place]? --      Constitutional: Alert and oriented. Well appearing and in no acute distress. Eyes: Conjunctivae are normal. PERRL. EOMI. Head: Atraumatic. ENT:      Ears:      Nose: No congestion/rhinnorhea.      Mouth/Throat: Mucous membranes are moist.  Neck: No stridor.   Cardiovascular: Normal rate, regular rhythm.  Good peripheral circulation. Respiratory: Normal respiratory effort without tachypnea or retractions. Lungs CTAB. Good air entry to the bases with no decreased or absent breath sounds. Musculoskeletal: Full range of motion to all extremities. No gross deformities appreciated.  Genitourinary: Opening with bloody drainage to right of rectum with surrounding tenderness to palpation. Neurologic:  Normal speech and language. No gross focal neurologic deficits are appreciated.  Skin:  Skin is warm, dry.   ____________________________________________   LABS (all labs ordered are listed, but only abnormal results are displayed)  Labs Reviewed  CBC - Abnormal; Notable for the following components:      Result Value   RBC 3.48 (*)    Hemoglobin 10.0 (*)    HCT 29.2 (*)    Platelets 634 (*)    All other components within normal limits  BASIC METABOLIC PANEL - Abnormal; Notable for the following components:   Sodium 131 (*)    Potassium 3.2 (*)    Chloride 96 (*)    Glucose, Bld 420 (*)    BUN <5 (*)    Creatinine, Ser 0.48 (*)    Calcium 8.3 (*)    All other components within normal limits  GLUCOSE, CAPILLARY - Abnormal; Notable for the following components:   Glucose-Capillary 373 (*)    All other components within normal limits  CULTURE, BLOOD (ROUTINE X 2)  CULTURE, BLOOD  (ROUTINE X 2)  AEROBIC/ANAEROBIC CULTURE (SURGICAL/DEEP WOUND)  LACTIC ACID, PLASMA  LACTIC ACID, PLASMA  HIV ANTIBODY (ROUTINE TESTING)  HEMOGLOBIN A1C   ____________________________________________  EKG   ____________________________________________  RADIOLOGY Robinette Haines, personally viewed and evaluated these images (plain radiographs) as part of my medical decision making, as well as reviewing the written report by the radiologist.  Ct Pelvis W Contrast  Result Date: 06/16/2017 CLINICAL DATA:  Perirectal abscess, evaluate for fistula EXAM: CT PELVIS WITH CONTRAST TECHNIQUE: Multidetector CT imaging of the pelvis was performed using the standard protocol following the bolus administration of intravenous contrast. CONTRAST:  100 mL Isovue 300 IV COMPARISON:  None. FINDINGS: Urinary Tract:  Bladder is within normal limits. Bowel: Rectal contrast has been administered. Perirectal fluid along the right posterolateral aspect of the anorectal region (series 3/image 97), extending into the medial right gluteal region/buttock, suggesting anorectal fistula with perirectal abscess. However, a discrete fistulous tract is not evident on CT. Linear extraluminal contrast is present on the current study (series 3/image 114), but this reflects contaminant, likely related to removal of the rectal tube. Vascular/Lymphatic: No evidence of aneurysm. No suspicious pelvic lymphadenopathy. Reproductive:  Prostate is unremarkable. Other:  Small volume pelvic ascites. Musculoskeletal: Gas and fluid collection in the right medial gluteal/buttock region, measuring 2.8 x 6.4 cm in maximal axial dimension (series 3/image 123), at which level this reflects predominantly gas. Superiorly, this reflects fluid tracking medially towards the anorectal region (series 3/images 97, 101, and 108). The full craniocaudal extent of the abnormality is approximately 7.1 cm (sagittal image 74). IMPRESSION: 2.8 x 6.4 cm gas and fluid  collection in the right medial gluteal/buttock region, with additional perirectal fluid along the posterolateral aspect of the anorectal junction. 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 However, a discrete fistulous tract is not evident on CT. The gas present in the soft tissues is presumed to be secondary to intervention and/or fistulous communication with rectum. Notably, soft tissue gas was likely present at the time of prior ultrasound on 06/03/2017, which supports this presumption. However, if there is clinical concern for necrotizing infection, consider emergent surgical evaluation. These results were called by telephone at the time of interpretation on 06/16/2017 at 10:36 am to [facility], [place], who verbally acknowledged these results. Electronically Signed   By: Julian Hy M.D.   On: 06/16/2017 10:37    ____________________________________________    PROCEDURES  Procedure(s) performed:    Procedures    Medications  heparin injection 5,000 Units ( Subcutaneous MAR Unhold 06/16/17 1451)  lactated ringers infusion ( Intravenous Anesthesia Volume Adjustment 06/16/17 1318)  ondansetron (ZOFRAN) tablet 4 mg ( Oral MAR Unhold 06/16/17 1451)    Or  ondansetron (ZOFRAN) injection 4 mg ( Intravenous MAR Unhold 06/16/17 1451)  piperacillin-tazobactam (ZOSYN) IVPB 3.375 g ( Intravenous MAR Unhold 06/16/17 1451)  morphine 2 MG/ML injection 2 mg ( Intravenous MAR Unhold 06/16/17 1451)  piperacillin-tazobactam (ZOSYN) IVPB 3.375 g (not administered)  oxyCODONE-acetaminophen (PERCOCET/ROXICET) 5-325 MG per tablet 1 tablet (not administered)  insulin aspart (novoLOG) injection 0-9 Units (not administered)  insulin glargine (LANTUS) injection 10 Units (not administered)  sodium chloride 0.9 % bolus 1,000 mL (0 mLs Intravenous Stopped 06/16/17 1003)  iopamidol (ISOVUE-300) 61 % injection 30 mL (30 mLs Other Contrast Given 06/16/17 0937)  iopamidol (ISOVUE-300) 61 % injection 100  mL (100 mLs Intravenous Contrast Given 06/16/17 0937)  clindamycin (CLEOCIN) IVPB 600 mg (0 mg Intravenous Stopped 06/16/17 1140)  piperacillin-tazobactam (ZOSYN) IVPB 3.375 g (0 g Intravenous Stopped 06/16/17 1217)  insulin aspart (novoLOG) injection 7 Units (7 Units Subcutaneous Given 06/16/17 1346)     ____________________________________________   INITIAL IMPRESSION / ASSESSMENT AND PLAN / ED COURSE  Pertinent labs & imaging results that were available during my care of the patient were reviewed by me and considered in my medical decision making (see chart for details).  Review of the [place] CSRS was performed in accordance of the [place] prior to dispensing any controlled drugs.   Patient's diagnosis is consistent with  perirectal abscess. Abscess is worsening on oral antibiotics. IV fluids and Clindamycin. Low RBC and increased platelet count likely from active bleed. CT pelvis indicated likely  abscess with fistula and gas present in soft tissue. Dr. Burt Knack was consulted and came to see the patient. He recommended patient be admitted for surgery today.     ____________________________________________  FINAL CLINICAL IMPRESSION(S) / ED DIAGNOSES  Final diagnoses:  Perirectal abscess      NEW MEDICATIONS STARTED DURING THIS VISIT:  ED Discharge Orders    None          This chart was dictated using voice recognition software/Dragon. Despite best efforts to proofread, errors can occur which can change the meaning. Any change was purely unintentional.    Laban Emperor, PA-C 06/16/17 1555    Harvest Dark, MD 06/17/17 2249

## 2017-06-16 NOTE — Op Note (Signed)
06/16/2017  1:16 PM  PATIENT:  [name]  22 y.o. male  PRE-OPERATIVE DIAGNOSIS:  Perirectal abscess  POST-OPERATIVE DIAGNOSIS:  same  PROCEDURE: EUA, I&D perirectal abscess  SURGEON:  Florene Glen MD, FACS  ANESTHESIA:    gen with LMA  Details of Procedure: Patient was brought to the operating room identified and induced to general anesthesia.  A surgical timeout was held.  He was placed in high lithotomy position and examination under anesthesia was performed.  A chronic appearing sinus tract near the scrotum was identified.  A clamp was placed into this wound after slightly enlarging that wound.  Cultures were obtained of purulence that exuded from the wound.  The clamp traversed a rather large cavity which appeared somewhat chronic in nature and a counterincision was performed posterior.  Exuded purulence was cleaned and then 1/4 inch Penrose drain was placed which traverse the cavity from one incision to the other.  It was held in with 3-0 nylon.  Sterile dressings were placed.  Patient tolerated this procedure well there were no complications he was taken to the recovery room in stable condition to be admitted for continued care  Sponge lap needle count was correct.   Florene Glen, MD FACS

## 2017-06-16 NOTE — ED Triage Notes (Signed)
Reports having abscess on "butt".  Reports seen on Jan 21 for same and given antibiotics and not any better.

## 2017-06-16 NOTE — ED Notes (Signed)
Called with no answer 

## 2017-06-16 NOTE — ED Notes (Addendum)
Called X 3 five minutes apart with no answer by tech.

## 2017-06-16 NOTE — ED Notes (Signed)
Consent signed for OR  Clothes,phone,shoes and earrings and 2 yellow chains taken with pt to OR

## 2017-06-16 NOTE — Anesthesia Procedure Notes (Signed)
Procedure Name: LMA Insertion Date/Time: 06/16/2017 12:55 PM Performed by: Dionne Bucy, CRNA Pre-anesthesia Checklist: Patient identified, Patient being monitored, Timeout performed, Emergency Drugs available and Suction available Patient Re-evaluated:Patient Re-evaluated prior to induction Oxygen Delivery Method: Circle system utilized Preoxygenation: Pre-oxygenation with 100% oxygen Induction Type: IV induction Ventilation: Mask ventilation without difficulty LMA: LMA inserted LMA Size: 4.0 Tube type: Oral Number of attempts: 1 Placement Confirmation: positive ETCO2 and breath sounds checked- equal and bilateral Tube secured with: Tape Dental Injury: Teeth and Oropharynx as per pre-operative assessment

## 2017-06-16 NOTE — ED Notes (Addendum)
Pt up at desk asking about wait. . Explained to pt that tech had called him multiple times while walking through lobby for his room. Placed back in computer and placed in flex wait for room when available

## 2017-06-16 NOTE — ED Notes (Signed)
Brought back from ct  Ambulates well to bathroom  Abscess area draining a large amt  Gown and sheet changed

## 2017-06-16 NOTE — ED Notes (Signed)
See triage note  Presents with a possible abscess area to buttocks  States he has had similar in past   Was recently placed on antibiotics by his PCP  But noticed some bleeding from site this am

## 2017-06-16 NOTE — Consult Note (Signed)
Medical Consultation  Jonathan Mcmillan ZOX:[id number] DOB: 01/04/1996 DOA: 06/16/2017 [facility]CP: [facility] [place]   Requesting physician:  Dr Burt Knack Date of consultation: 06/16/2017 Reason for consultation: Diabetes  Impression/Recommendations  22 year old male with Diabetes who is POD #0 for perirectal abscess.  1. 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 Perirectal abscess: Management per Surgery  5. Tobacco dependence: Patient is encouraged to quit smoking. Counseling was provided for 4 minutes.   Case management consultation requested for medications at discharge Chief Complaint: perirectal abscess  HPI:   22 y/o male with Diabetes who presented to ED with draining perirectal abscess.  Hospitalists are consulted for diabetes management. Patient reports that he sees Dr. Veda Canning for his diabetes. He reports that he takes 70/3025 units twice a day on most days. He does not know his A1c.    Review of Systems  Constitutional: Negative for fever, chills weight loss HENT: Negative for ear pain, nosebleeds, congestion, facial swelling, rhinorrhea, neck pain, neck stiffness and ear discharge.   Respiratory: Negative for cough, shortness of breath, wheezing  Cardiovascular: Negative for chest pain, palpitations and leg swelling.  Gastrointestinal: Negative for heartburn, abdominal pain, vomiting, diarrhea or consitpation Genitourinary: Negative for dysuria, urgency, frequency, hematuria Musculoskeletal: Negative for back pain or joint pain Neurological: Negative for dizziness, seizures, syncope, focal weakness,  numbness and headaches.  Hematological: Does not bruise/bleed easily.   Psychiatric/Behavioral: Negative for hallucinations, confusion, dysphoric mood SKIN perirectal abscess which was drained  Past Medical History:  Diagnosis Date  . Diabetes 1.5, managed as type 1 [facility])    Past Surgical History:  Procedure Laterality Date  . none     Social History:  reports that he has been smoking cigarettes.  He has been smoking about 0.50 packs per day. he has never used smokeless tobacco. He reports that he uses drugs. Drug: Marijuana. He reports that he does not drink alcohol.  No Known Allergies Family History  Problem Relation Age of Onset  . Diabetes Mother     Prior to Admission medications   Medication Sig Start Date End Date Taking? Authorizing Provider  acetaminophen (TYLENOL) 325 MG tablet Take 2 tablets (650 mg total) by mouth every 6 (six) hours as needed for mild pain (or Fever >/= 101). 03/08/17   Nicholes Mango, MD  amoxicillin-clavulanate (AUGMENTIN) 875-125 MG tablet Take 1 tablet by mouth 2 (two) times daily for 14 days. 06/03/17 06/17/17  Darel Hong, MD  docusate sodium (COLACE) 100 MG capsule Take 1 capsule (100 mg total) by mouth 2 (two) times daily as needed for mild constipation. 03/08/17   Nicholes Mango, MD  docusate sodium (COLACE) 100 MG capsule Take 1 capsule (100 mg total) by mouth daily as needed. 06/03/17 06/03/18  Darel Hong, MD  doxycycline (VIBRA-TABS) 100 MG tablet Take 100 mg by mouth 2 (two) times daily.    [provider]  insulin glargine (LANTUS) 100 UNIT/ML injection Inject 0.12 mLs (12 Units total) into the skin daily. 03/09/17   Gouru, Illene Silver, MD  insulin lispro (HUMALOG) 100 UNIT/ML injection 5 units 3 times a day with meals 03/08/17 03/08/18  Nicholes Mango, MD  Multiple Vitamin (MULTIVITAMIN WITH MINERALS) TABS tablet Take 1 tablet  by mouth daily. 03/09/17   Gouru, Illene Silver, MD  oxyCODONE (OXY IR/ROXICODONE) 5 MG immediate release tablet Take 1 tablet (5 mg total) by mouth every 6 (six) hours as needed for moderate  pain or breakthrough pain. 03/08/17   Nicholes Mango, MD  oxyCODONE-acetaminophen (PERCOCET) 7.5-325 MG tablet Take 1 tablet by mouth every 6 (six) hours as needed for severe pain. 06/03/17 06/03/18  Darel Hong, MD    Physical Exam: Blood pressure (!) 127/97, pulse 99, temperature 97.9 F (36.6 C), temperature source Oral, resp. rate 18, height 5\' 8"  (1.727 m), weight 61.2 kg (135 lb), SpO2 100 %. @VITALS2 @ [employer]   06/16/17 0424  Weight: 61.2 kg (135 lb)    Intake/Output Summary (Last 24 hours) at 06/16/2017 1505 Last data filed at 06/16/2017 1422 Gross per 24 hour  Intake 745 ml  Output 5 ml  Net 740 ml     Constitutional: Appears well-developed and well-nourished. No distress. HENT: Normocephalic. [name] Oropharynx is clear and moist.  Eyes: Conjunctivae and EOM are normal. PERRLA, no scleral icterus.  Neck: Normal ROM. Neck supple. No JVD. No tracheal deviation. CVS: RRR, S1/S2 +, no murmurs, no gallops, no carotid bruit.  Pulmonary: Effort and breath sounds normal, no stridor, rhonchi, wheezes, rales.  Abdominal: Soft. BS +,  no distension, tenderness, rebound or guarding.  Musculoskeletal: Normal range of motion. No edema and no tenderness.  Neuro: Alert. CN 2-12 grossly intact. No focal deficits. Skin: he did not allow me to see his back side Psychiatric: Normal mood and affect.    Labs  Basic Metabolic Panel: Recent Labs  Lab 06/16/17 0829  NA 131*  K 3.2*  CL 96*  CO2 26  GLUCOSE 420*  BUN <5*  CREATININE 0.48*  CALCIUM 8.3*   Liver Function Tests: No results for input(s): AST, ALT, ALKPHOS, BILITOT, PROT, ALBUMIN in the last 168 hours. No results for input(s): LIPASE, AMYLASE in the last 168 hours.  CBC: Recent Labs  Lab 06/16/17 0829  WBC 9.3  HGB 10.0*  HCT 29.2*  MCV 83.9  PLT 634*   Cardiac Enzymes: No results for input(s): CKTOTAL, CKMB, CKMBINDEX, TROPONINI in the last 168 hours. BNP: Invalid input(s): POCBNP CBG: Recent Labs  Lab  06/16/17 1338  GLUCAP 373*    Radiological Exams: Ct Pelvis W Contrast  Result Date: 06/16/2017 CLINICAL DATA:  Perirectal abscess, evaluate for fistula EXAM: CT PELVIS WITH CONTRAST TECHNIQUE: Multidetector CT imaging of the pelvis was performed using the standard protocol following the bolus administration of intravenous contrast. CONTRAST:  100 mL Isovue 300 IV COMPARISON:  None. FINDINGS: Urinary Tract:  Bladder is within normal limits. Bowel: Rectal contrast has been administered. Perirectal fluid along the right posterolateral aspect of the anorectal region (series 3/image 97), extending into the medial right gluteal region/buttock, suggesting anorectal fistula with perirectal abscess. However, a discrete fistulous tract is not evident on CT. Linear extraluminal contrast is present on the current study (series 3/image 114), but this reflects contaminant, likely related to removal of the rectal tube. Vascular/Lymphatic: No evidence of aneurysm. No suspicious pelvic lymphadenopathy. Reproductive:  Prostate is unremarkable. Other:  Small volume pelvic ascites. Musculoskeletal: Gas and fluid collection in the right medial gluteal/buttock region, measuring 2.8 x 6.4 cm in maximal axial dimension (series 3/image 123), at which level this reflects predominantly gas. Superiorly, this reflects fluid tracking medially towards the anorectal region (series 3/images 97, 101, and 108). The full craniocaudal extent of the abnormality is approximately 7.1 cm (sagittal image  74). IMPRESSION: 2.8 x 6.4 cm gas and fluid collection in the right medial gluteal/buttock region, with additional perirectal fluid along the posterolateral aspect of the anorectal junction. 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 However, a discrete fistulous tract is not evident on CT. The gas present in the soft tissues is presumed to be secondary to intervention and/or fistulous communication with rectum. Notably,  soft tissue gas was likely present at the time of prior ultrasound on 06/03/2017, which supports this presumption. However, if there is clinical concern for necrotizing infection, consider emergent surgical evaluation. These results were called by telephone at the time of interpretation on 06/16/2017 at 10:36 am to [facility], [place], who verbally acknowledged these results. Electronically Signed   By: Julian Hy M.D.   On: 06/16/2017 10:37    EKG: none   Thank you for allowing me to participate in the care of your patient. We will continue to follow.   Note: This dictation was prepared with Dragon dictation along with smaller phrase technology. Any transcriptional errors that result from this process are unintentional.  Time spent: 41 minutes  PCP: Dr Baltazar Apo  Makayleigh Poliquin, Ulice Bold, MD

## 2017-06-16 NOTE — Anesthesia Preprocedure Evaluation (Addendum)
Anesthesia Evaluation  Patient identified by MRN, date of birth, ID band Patient awake    Reviewed: Allergy & Precautions, H&P , NPO status , Patient's Chart, lab work & pertinent test results, reviewed documented beta blocker date and time   Airway Mallampati: II  TM Distance: >3 FB Neck ROM: full    Dental  (+) Teeth Intact   Pulmonary neg pulmonary ROS, Current Smoker,    Pulmonary exam normal        Cardiovascular Exercise Tolerance: Good hypertension, negative cardio ROS Normal cardiovascular exam Rate:Normal     Neuro/Psych negative neurological ROS  negative psych ROS   GI/Hepatic negative GI ROS, Neg liver ROS,   Endo/Other  negative endocrine ROSdiabetes  Renal/GU negative Renal ROS  negative genitourinary   Musculoskeletal   Abdominal   Peds  Hematology negative hematology ROS (+)   Anesthesia Other Findings   Reproductive/Obstetrics negative OB ROS                            Anesthesia Physical Anesthesia Plan  ASA: III and emergent  Anesthesia Plan: General LMA   Post-op Pain Management:    Induction:   PONV Risk Score and Plan: 2  Airway Management Planned:   Additional Equipment:   Intra-op Plan:   Post-operative Plan:   Informed Consent: I have reviewed the patients History and Physical, chart, labs and discussed the procedure including the risks, benefits and alternatives for the proposed anesthesia with the patient or authorized representative who has indicated his/her understanding and acceptance.     Plan Discussed with: CRNA  Anesthesia Plan Comments: (Recent blood sugars noted and emergency status requested.  Will proceed with GA LMA as soon as possible.  JA)       Anesthesia Quick Evaluation

## 2017-06-17 ENCOUNTER — Encounter: Payer: Self-pay | Admitting: Surgery

## 2017-06-17 LAB — HEMOGLOBIN A1C
HEMOGLOBIN A1C: 10.9 % — AB (ref 4.8–5.6)
Mean Plasma Glucose: 266.13 mg/dL

## 2017-06-17 LAB — GLUCOSE, CAPILLARY
GLUCOSE-CAPILLARY: 253 mg/dL — AB (ref 65–99)
GLUCOSE-CAPILLARY: 269 mg/dL — AB (ref 65–99)
Glucose-Capillary: 119 mg/dL — ABNORMAL HIGH (ref 65–99)
Glucose-Capillary: 171 mg/dL — ABNORMAL HIGH (ref 65–99)

## 2017-06-17 MED ORDER — POTASSIUM CHLORIDE CRYS ER 20 MEQ PO TBCR
40.0000 meq | EXTENDED_RELEASE_TABLET | Freq: Once | ORAL | Status: AC
  Administered 2017-06-17: 40 meq via ORAL
  Filled 2017-06-17: qty 2

## 2017-06-17 MED ORDER — INSULIN GLARGINE 100 UNIT/ML ~~LOC~~ SOLN
15.0000 [IU] | Freq: Every day | SUBCUTANEOUS | Status: DC
  Filled 2017-06-17: qty 0.15

## 2017-06-17 MED ORDER — INSULIN GLARGINE 100 UNIT/ML ~~LOC~~ SOLN
15.0000 [IU] | Freq: Two times a day (BID) | SUBCUTANEOUS | Status: DC
  Filled 2017-06-17: qty 0.15

## 2017-06-17 MED ORDER — SULFAMETHOXAZOLE-TRIMETHOPRIM 800-160 MG PO TABS
1.0000 | ORAL_TABLET | Freq: Two times a day (BID) | ORAL | Status: DC
  Administered 2017-06-17 – 2017-06-18 (×3): 1 via ORAL
  Filled 2017-06-17 (×3): qty 1

## 2017-06-17 MED ORDER — INSULIN ASPART 100 UNIT/ML ~~LOC~~ SOLN
5.0000 [IU] | Freq: Three times a day (TID) | SUBCUTANEOUS | Status: DC
  Administered 2017-06-17 – 2017-06-18 (×3): 5 [IU] via SUBCUTANEOUS
  Filled 2017-06-17 (×3): qty 1

## 2017-06-17 MED ORDER — INSULIN ASPART PROT & ASPART (70-30 MIX) 100 UNIT/ML ~~LOC~~ SUSP
11.0000 [IU] | Freq: Two times a day (BID) | SUBCUTANEOUS | Status: DC
  Administered 2017-06-18: 11 [IU] via SUBCUTANEOUS
  Filled 2017-06-17: qty 1

## 2017-06-17 MED ORDER — ACETAMINOPHEN 325 MG PO TABS: 650.0000 mg | ORAL_TABLET | Freq: Four times a day (QID) | ORAL | Status: DC | PRN

## 2017-06-17 NOTE — Progress Notes (Signed)
Inpatient Diabetes Program Recommendations  AACE/ADA: New Consensus Statement on Inpatient Glycemic Control (2015)  Target Ranges:  Prepandial:   less than 140 mg/dL      Peak postprandial:   less than 180 mg/dL (1-2 hours)      Critically ill patients:  140 - 180 mg/dL   Lab Results  Component Value Date   GLUCAP 253 (H) 06/17/2017   HGBA1C 10.9 (H) 06/16/2017    Review of Glycemic ControlResults for Jonathan Mcmillan, Jonathan Mcmillan (MRN [id number]) as of 06/17/2017 11:30  Ref. Range 06/16/2017 13:38 06/16/2017 16:35 06/16/2017 20:52 06/16/2017 22:47 06/17/2017 07:24  Glucose-Capillary Latest Ref Range: 65 - 99 mg/dL 373 (H) 77 306 (H) 232 (H) 253 (H)   Results for Jonathan Mcmillan, Jonathan Mcmillan (MRN [id number]) as of 06/17/2017 11:30  Ref. Range 03/06/2017 02:40 03/07/2017 04:41 06/16/2017 08:29  Hemoglobin A1C Latest Ref Range: 4.8 - 5.6 % 14.1 (H) 13.9 (H) 10.9 (H)   Diabetes history: DM-  Outpatient Diabetes medications: Novolog 70/30 11 units bid (was using pens) Current orders for Inpatient glycemic control:  Lantus 10 units daily, Novolog sensitive tid with meals  Inpatient Diabetes Program Recommendations:    Spoke with Jonathan Mcmillan regarding diabetes.  At first he was defensive stating "I take my insulin". We discussed that his A1C in improved since October 2018.  I also gave him handout regarding A1C, goal blood sugars and standards of care.  Unclear if he is monitoring and taking insulin consistently, however I praised him for improved A1C results.  He still will need close follow up at [facility] clinic.  Called and talked to medication management clinic- Jonathan Mcmillan has had rx. For Lantus/Humalog in the past but was switched to 70/30.    They only have 70/30 available in vial at this time.  Confirmed with Jonathan Mcmillan that he is ok using vial and syringe.   While in the hospital, consider d/c of Lantus/Novolog and restart 70/30 11 units bid.   Upon D/C, he will need new Rx. For Novolog 70/30, insulin syringes, and glucose meter  strips.    Thanks, Adah Perl, RN, BC-ADM Inpatient Diabetes Coordinator Pager [phone] (8a-5p)

## 2017-06-17 NOTE — Progress Notes (Signed)
[place] Hospital Day(s): 1.   Post op day(s): 1 Day Post-Op.   Interval History: Patient seen and examined, no acute events or new complaints overnight. Patient reports minimal peri-incisional/peri-anal pain, tolerating diet, denies fever/chills, N/V, CP, or SOB.  Review of Systems:  Constitutional: denies fever, chills  HEENT: denies cough or congestion  Respiratory: denies any shortness of breath  Cardiovascular: denies chest pain or palpitations  Gastrointestinal: abdominal/perineal pain, N/V, and bowel function as per interval history Genitourinary: denies burning with urination or urinary frequency Musculoskeletal: denies pain, decreased motor or sensation Integumentary: denies any other rashes or skin discolorations except post-surgical perianal wound Neurological: denies HA or vision/hearing changes   Vital signs in last 24 hours: [min-max] current  Temp:  [97.9 F (36.6 C)-98.8 F (37.1 C)] 98 F (36.7 C) (02/03 2050) Pulse Rate:  [84-101] 89 (02/03 2050) Resp:  [17-23] 18 (02/03 2050) BP: (111-158)/(63-105) 148/96 (02/03 2050) SpO2:  [98 %-100 %] 100 % (02/03 2050)     Height: 5\' 8"  (172.7 cm) Weight: 135 lb (61.2 kg) BMI (Calculated): 20.53   Intake/Output this shift:  No intake/output data recorded.   Intake/Output last 2 shifts:  @IOLAST2SHIFTS @   Physical Exam:  Constitutional: alert, cooperative and no distress  HENT: normocephalic without obvious abnormality  Eyes: PERRL, EOM's grossly intact and symmetric  Neuro: CN II - XII grossly intact and symmetric without deficit  Respiratory: breathing non-labored at rest  Cardiovascular: regular rate and sinus rhythm  Gastrointestinal: soft, non-tender, non-distended, Right buttock peri-anal penrose drain without surrounding erythema or drainage Musculoskeletal: UE and LE FROM, no edema or wounds, motor and sensation grossly intact, NT   Labs:  CBC Latest Ref Rng & Units 06/16/2017 06/03/2017  03/06/2017  WBC 3.8 - 10.6 K/uL 9.3 19.2(H) 17.0(H)  Hemoglobin 13.0 - 18.0 g/dL 10.0(L) 11.8(L) 13.4  Hematocrit 40.0 - 52.0 % 29.2(L) 35.9(L) 41.3  Platelets 150 - 440 K/uL 634(H) 194 414   CMP Latest Ref Rng & Units 06/16/2017 06/03/2017 03/06/2017  Glucose 65 - 99 mg/dL 420(H) 537(HH) 545(HH)  BUN 6 - 20 mg/dL <5(L) 9 5(L)  Creatinine 0.61 - 1.24 mg/dL 0.48(L) 0.75 0.57(L)  Sodium 135 - 145 mmol/L 131(L) 128(L) 130(L)  Potassium 3.5 - 5.1 mmol/L 3.2(L) 3.4(L) 4.0  Chloride 101 - 111 mmol/L 96(L) 93(L) 92(L)  CO2 22 - 32 mmol/L 26 24 27   Calcium 8.9 - 10.3 mg/dL 8.3(L) 8.8(L) 9.2  Total Protein 6.5 - 8.1 g/dL - - 8.1  Total Bilirubin 0.3 - 1.2 mg/dL - - 0.7  Alkaline Phos 38 - 126 U/L - - 166(H)  AST 15 - 41 U/L - - 15  ALT 17 - 63 U/L - - 11(L)   HbA1C (06/16/2017): 10.9  Imaging studies: No new pertinent imaging studies   Assessment/Plan: (ICD-10's: K61.0) 22 y.o. male with peri-rectal abscess 1 Day Post-Op s/p operative incision and drainage, complicated by very poorly controlled DM and chronic ongoing tobacco smoking.   - pain control prn   - IV antibiotics and blood glucose control   - DVT prophylaxis, ambulation encouraged  - discussed with patient potential complications of poor blood glucose control, particularly exacerbated by smoking  - discharge planning with PO antibiotics  All of the above findings and recommendations were discussed with the patient, medical physician, and RN, and all of patient's questions were answered to his expressed satisfaction.  -- Marilynne Drivers Rosana Hoes, MD, [place]: [facility] - Partnering for exceptional  care. Office: [phone]

## 2017-06-17 NOTE — Progress Notes (Addendum)
[place] at [place] NAME: Jonathan Mcmillan    MR#:  [id number]  DATE OF BIRTH:  Jan 29, 1996  SUBJECTIVE:  I Want to go home Denies any complaints.  Sugars are a little bit better today  REVIEW OF SYSTEMS:   Review of Systems  Constitutional: Negative for chills, fever and weight loss.  HENT: Negative for ear discharge, ear pain and nosebleeds.   Eyes: Negative for blurred vision, pain and discharge.  Respiratory: Negative for sputum production, shortness of breath, wheezing and stridor.   Cardiovascular: Negative for chest pain, palpitations, orthopnea and PND.  Gastrointestinal: Negative for abdominal pain, diarrhea, nausea and vomiting.  Genitourinary: Negative for frequency and urgency.  Musculoskeletal: Negative for back pain and joint pain.  Neurological: Positive for weakness. Negative for sensory change, speech change and focal weakness.  Psychiatric/Behavioral: Negative for depression and hallucinations. The patient is not nervous/anxious.    Tolerating Diet: Yes Tolerating PT: Not needed  DRUG ALLERGIES:  No Known Allergies  VITALS:  Blood pressure (!) 148/96, pulse 89, temperature 98 F (36.7 C), temperature source Oral, resp. rate 18, height 5\' 8"  (1.727 m), weight 61.2 kg (135 lb), SpO2 100 %.  PHYSICAL EXAMINATION:   Physical Exam  GENERAL:  22 year old patient lying in the bed with no acute distress.  EYES: Pupils equal, round, reactive to light and accommodation. No scleral icterus. Extraocular muscles intact.  HEENT: Head atraumatic, normocephalic. Oropharynx and nasopharynx clear.  NECK:  Supple, no jugular venous distention. No thyroid enlargement, no tenderness.  LUNGS: Normal breath sounds bilaterally, no wheezing, rales, rhonchi. No use of accessory muscles of respiration.  CARDIOVASCULAR: S1, S2 normal. No murmurs, rubs, or gallops.  ABDOMEN: Soft, nontender, nondistended. Bowel sounds present. No  organomegaly or mass.  EXTREMITIES: No cyanosis, clubbing or edema b/l.    NEUROLOGIC: Cranial nerves II through XII are intact. No focal Motor or sensory deficits b/l.   PSYCHIATRIC:  patient is alert and oriented x 3.  SKIN: No obvious rash, lesion, or ulcer.  Surgical dressing on the buttock  LABORATORY PANEL:  CBC Recent Labs  Lab 06/16/17 0829  WBC 9.3  HGB 10.0*  HCT 29.2*  PLT 634*    Chemistries  Recent Labs  Lab 06/16/17 0829  NA 131*  K 3.2*  CL 96*  CO2 26  GLUCOSE 420*  BUN <5*  CREATININE 0.48*  CALCIUM 8.3*   Cardiac Enzymes No results for input(s): TROPONINI in the last 168 hours. RADIOLOGY:  Ct Pelvis W Contrast  Result Date: 06/16/2017 CLINICAL DATA:  Perirectal abscess, evaluate for fistula EXAM: CT PELVIS WITH CONTRAST TECHNIQUE: Multidetector CT imaging of the pelvis was performed using the standard protocol following the bolus administration of intravenous contrast. CONTRAST:  100 mL Isovue 300 IV COMPARISON:  None. FINDINGS: Urinary Tract:  Bladder is within normal limits. Bowel: Rectal contrast has been administered. Perirectal fluid along the right posterolateral aspect of the anorectal region (series 3/image 97), extending into the medial right gluteal region/buttock, suggesting anorectal fistula with perirectal abscess. However, a discrete fistulous tract is not evident on CT. Linear extraluminal contrast is present on the current study (series 3/image 114), but this reflects contaminant, likely related to removal of the rectal tube. Vascular/Lymphatic: No evidence of aneurysm. No suspicious pelvic lymphadenopathy. Reproductive:  Prostate is unremarkable. Other:  Small volume pelvic ascites. Musculoskeletal: Gas and fluid collection in the right medial gluteal/buttock region, measuring 2.8 x 6.4 cm in maximal axial dimension (  series 3/image 123), at which level this reflects predominantly gas. Superiorly, this reflects fluid tracking medially towards the  anorectal region (series 3/images 97, 101, and 108). The full craniocaudal extent of the abnormality is approximately 7.1 cm (sagittal image 74). IMPRESSION: 2.8 x 6.4 cm gas and fluid collection in the right medial gluteal/buttock region, with additional perirectal fluid along the posterolateral aspect of the anorectal junction. 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 However, a discrete fistulous tract is not evident on CT. The gas present in the soft tissues is presumed to be secondary to intervention and/or fistulous communication with rectum. Notably, soft tissue gas was likely present at the time of prior ultrasound on 06/03/2017, which supports this presumption. However, if there is clinical concern for necrotizing infection, consider emergent surgical evaluation. These results were called by telephone at the time of interpretation on 06/16/2017 at 10:36 am to [facility], [facility], who verbally acknowledged these results. Electronically Signed   By: Julian Hy M.D.   On: 06/16/2017 10:37   ASSESSMENT AND PLAN:  22 year old male with Diabetes who is POD #1 for right gluteal/perirectal abscess.  1. 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  He reports he uses Levemir and or 70/30 and reports "everything is the same" -Follows with [facility] not sure how compliant he is/  2.Hypokalemia: Replete and check in am Po k today  3, Hyponatremia: Corrects to 134 with elevated blood sugar Repeat in am after IVF  4. Perirectal abscess: Management per Surgery -Wound culture shows abundant gram-positive cocci, moderate gram-negative rods and moderate gram positive rods. -Currently on IV Zosyn, will add bactrim for MRSA coverage -consider augmentin =bactrim DS at d/c since [facility] is  polymicrobial  5. Tobacco dependence: Patient is encouraged to quit smoking. Counseling was provided for 4 minutes.   Case discussed with Care Management/Social Worker. Management plans discussed with the patient, family and they are in agreement.  CODE STATUS: full  DVT Prophylaxis: lovenox  TOTAL TIME TAKING CARE OF THIS PATIENT: 25 minutes.  >50% time spent on counselling and coordination of care  POSSIBLE D/C IN *1-2* DAYS, DEPENDING ON CLINICAL CONDITION.  Note: This dictation was prepared with Dragon dictation along with smaller phrase technology. Any transcriptional errors that result from this process are unintentional.  Fritzi Mandes M.D on 06/17/2017 at 10:57 AM  Between 7am to 6pm - Pager - [phone]  After 6pm go to [email or web address] - password [email or web address] [facility]  Office  [phone]  CC: Primary care physician; [facility]Patient ID: Jonathan Mcmillan, male   DOB: February 03, 1996, 22 y.o.   MRN: [id number]

## 2017-06-18 LAB — GLUCOSE, CAPILLARY: Glucose-Capillary: 229 mg/dL — ABNORMAL HIGH (ref 65–99)

## 2017-06-18 MED ORDER — AMOXICILLIN-POT CLAVULANATE 875-125 MG PO TABS: 1.0000 | ORAL_TABLET | Freq: Two times a day (BID) | ORAL | 0 refills | Status: AC

## 2017-06-18 MED ORDER — INSULIN ASPART PROT & ASPART (70-30 MIX) 100 UNIT/ML ~~LOC~~ SUSP: 11.0000 [IU] | Freq: Two times a day (BID) | SUBCUTANEOUS | 11 refills | Status: DC

## 2017-06-18 MED ORDER — OXYCODONE-ACETAMINOPHEN 5-325 MG PO TABS: 1.0000 | ORAL_TABLET | ORAL | 0 refills | Status: DC | PRN

## 2017-06-18 NOTE — Discharge Instructions (Signed)
In addition to included general instructions for Peri-Rectal Abscess,  Wound care: Keep abscess site clean and dry (okay to shower with soap and water, pat dry), but no baths or submerging incisions underwater until follow-up.  Medications: Resume all home medications AND complete course of prescribed antibiotics even if/when feeling better/well. For mild to moderate pain: acetaminophen (Tylenol) or ibuprofen/naproxen (if no kidney disease). Combining Tylenol with alcohol can substantially increase your risk of causing liver disease.  Call surgery office [phone]) at any time if any questions, worsening pain, fevers/chills, bleeding, increased drainage from incision site, or other concerns.

## 2017-06-18 NOTE — Progress Notes (Signed)
[place] at [place] NAME: Jonathan Mcmillan    MR#:  [id number]  DATE OF BIRTH:  Nov 01, 1995  SUBJECTIVE:  I Want to go home Denies any complaints.  Sugars are a little bit better today  REVIEW OF SYSTEMS:   Review of Systems  Constitutional: Negative for chills, fever and weight loss.  HENT: Negative for ear discharge, ear pain and nosebleeds.   Eyes: Negative for blurred vision, pain and discharge.  Respiratory: Negative for sputum production, shortness of breath, wheezing and stridor.   Cardiovascular: Negative for chest pain, palpitations, orthopnea and PND.  Gastrointestinal: Negative for abdominal pain, diarrhea, nausea and vomiting.  Genitourinary: Negative for frequency and urgency.  Musculoskeletal: Negative for back pain and joint pain.  Neurological: Positive for weakness. Negative for sensory change, speech change and focal weakness.  Psychiatric/Behavioral: Negative for depression and hallucinations. The patient is not nervous/anxious.    Tolerating Diet: Yes Tolerating PT: Not needed  DRUG ALLERGIES:  No Known Allergies  VITALS:  Blood pressure (!) 153/97, pulse 81, temperature 97.8 F (36.6 C), temperature source Oral, resp. rate 20, height 5\' 8"  (1.727 m), weight 61.2 kg (135 lb), SpO2 100 %.  PHYSICAL EXAMINATION:   Physical Exam  GENERAL:  22 y.o.-year-old patient lying in the bed with no acute distress.  EYES: Pupils equal, round, reactive to light and accommodation. No scleral icterus. Extraocular muscles intact.  HEENT: Head atraumatic, normocephalic. Oropharynx and nasopharynx clear.  NECK:  Supple, no jugular venous distention. No thyroid enlargement, no tenderness.  LUNGS: Normal breath sounds bilaterally, no wheezing, rales, rhonchi. No use of accessory muscles of respiration.  CARDIOVASCULAR: S1, S2 normal. No murmurs, rubs, or gallops.  ABDOMEN: Soft, nontender, nondistended. Bowel sounds present. No  organomegaly or mass.  EXTREMITIES: No cyanosis, clubbing or edema b/l.    NEUROLOGIC: Cranial nerves II through XII are intact. No focal Motor or sensory deficits b/l.   PSYCHIATRIC:  patient is alert and oriented x 3.  SKIN: No obvious rash, lesion, or ulcer.  Surgical dressing on the buttock  LABORATORY PANEL:  CBC Recent Labs  Lab 06/16/17 0829  WBC 9.3  HGB 10.0*  HCT 29.2*  PLT 634*    Chemistries  Recent Labs  Lab 06/16/17 0829  NA 131*  K 3.2*  CL 96*  CO2 26  GLUCOSE 420*  BUN <5*  CREATININE 0.48*  CALCIUM 8.3*   Cardiac Enzymes No results for input(s): TROPONINI in the last 168 hours. RADIOLOGY:  Ct Pelvis W Contrast  Result Date: 06/16/2017 CLINICAL DATA:  Perirectal abscess, evaluate for fistula EXAM: CT PELVIS WITH CONTRAST TECHNIQUE: Multidetector CT imaging of the pelvis was performed using the standard protocol following the bolus administration of intravenous contrast. CONTRAST:  100 mL Isovue 300 IV COMPARISON:  None. FINDINGS: Urinary Tract:  Bladder is within normal limits. Bowel: Rectal contrast has been administered. Perirectal fluid along the right posterolateral aspect of the anorectal region (series 3/image 97), extending into the medial right gluteal region/buttock, suggesting anorectal fistula with perirectal abscess. However, a discrete fistulous tract is not evident on CT. Linear extraluminal contrast is present on the current study (series 3/image 114), but this reflects contaminant, likely related to removal of the rectal tube. Vascular/Lymphatic: No evidence of aneurysm. No suspicious pelvic lymphadenopathy. Reproductive:  Prostate is unremarkable. Other:  Small volume pelvic ascites. Musculoskeletal: Gas and fluid collection in the right medial gluteal/buttock region, measuring 2.8 x 6.4 cm in maximal axial dimension (  series 3/image 123), at which level this reflects predominantly gas. Superiorly, this reflects fluid tracking medially towards the  anorectal region (series 3/images 97, 101, and 108). The full craniocaudal extent of the abnormality is approximately 7.1 cm (sagittal image 74). IMPRESSION: 2.8 x 6.4 cm gas and fluid collection in the right medial gluteal/buttock region, with additional perirectal fluid along the posterolateral aspect of the anorectal junction. 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 However, a discrete fistulous tract is not evident on CT. The gas present in the soft tissues is presumed to be secondary to intervention and/or fistulous communication with rectum. Notably, soft tissue gas was likely present at the time of prior ultrasound on 06/03/2017, which supports this presumption. However, if there is clinical concern for necrotizing infection, consider emergent surgical evaluation. These results were called by telephone at the time of interpretation on 06/16/2017 at 10:36 am to [facility], [place], who verbally acknowledged these results. Electronically Signed   By: Julian Hy M.D.   On: 06/16/2017 10:37   ASSESSMENT AND PLAN:  22 year old male with Diabetes who is POD #1 for right gluteal/perirectal abscess.  1. Uncontrolled Diabetes due to perirectal abscess:  - SSI with ADA diet -insulin 70/30 11 units twice daily, aspart 5 units 3 times daily with meals -A1 c 10.4 -It seems patient has been noncompliant with the kind of insulin he uses.  He reports he uses Levemir and or 70/30 and reports "everything is the same" -[facility]s [facility] not sure how compliant he is/  2.Hypokalemia:  Received po K  3, Hyponatremia: Corrects to 134 with elevated blood sugar -due to high sugars  4. Perirectal abscess: Management per Surgery -Wound culture shows abundant gram-positive cocci, moderate gram-negative rods and moderate gram positive rods. -Currently on IV Zosyn--augmentin and doxycline (finish up per Dr Rosana Hoes as out pt)  5. Tobacco dependence: Patient is encouraged  to quit smoking. Counseling was provided for 4 minutes.  Ok to d/c from medical standpoint  Case discussed with Care Management/Social Worker. Management plans discussed with the patient, family and they are in agreement.  CODE STATUS: full  DVT Prophylaxis: lovenox  TOTAL TIME TAKING CARE OF THIS PATIENT: 25 minutes.  >50% time spent on counselling and coordination of care     Note: This dictation was prepared with Dragon dictation along with smaller phrase technology. Any transcriptional errors that result from this process are unintentional.  Fritzi Mandes M.D on 06/18/2017 at 9:02 AM  Between 7am to 6pm - Pager - [phone]  After 6pm go to [email or web address] - password [email or web address] [facility]  Office  [phone]  CC: Primary care physician; [facility] HealthPatient ID: LAUTARO KORAL, male   DOB: [birth year], 23 y.o.   MRN: [id number]

## 2017-06-18 NOTE — Telephone Encounter (Signed)
Spoke with patients aunt, asking her to have the patient call our office.

## 2017-06-18 NOTE — Anesthesia Postprocedure Evaluation (Signed)
Anesthesia Post Note  Patient: Jonathan Mcmillan  Procedure(s) Performed: IRRIGATION AND DEBRIDEMENT PERIRECTAL ABSCESS (N/A )  Patient location during evaluation: PACU Anesthesia Type: General Level of consciousness: awake and alert Pain management: pain level controlled Vital Signs Assessment: post-procedure vital signs reviewed and stable Respiratory status: spontaneous breathing, nonlabored ventilation, respiratory function stable and patient connected to nasal cannula oxygen Cardiovascular status: blood pressure returned to baseline and stable Postop Assessment: no apparent nausea or vomiting Anesthetic complications: no     Last Vitals:  Vitals:   06/17/17 2036 06/18/17 0515  BP: 122/68 (!) 153/97  Pulse: 94 81  Resp: 20   Temp: 36.8 C 36.6 C  SpO2: 100% 100%    Last Pain:  Vitals:   06/18/17 0835  TempSrc:   PainSc: [place] Christy Ehrsam

## 2017-06-18 NOTE — Progress Notes (Signed)
Inpatient Diabetes Program Recommendations  AACE/ADA: New Consensus Statement on Inpatient Glycemic Control (2015)  Target Ranges:  Prepandial:   less than 140 mg/dL      Peak postprandial:   less than 180 mg/dL (1-2 hours)      Critically ill patients:  140 - 180 mg/dL   Lab Results  Component Value Date   GLUCAP 229 (H) 06/18/2017   HGBA1C 10.9 (H) 06/16/2017    Review of Glycemic ControlResults for JAHMAD, PETRICH (MRN [id number]) as of 06/18/2017 08:24  Ref. Range 06/17/2017 07:24 06/17/2017 11:32 06/17/2017 16:54 06/17/2017 21:07 06/18/2017 08:13  Glucose-Capillary Latest Ref Range: 65 - 99 mg/dL 253 (H) 269 (H) 119 (H) 171 (H) 229 (H)   Diabetes history: DM- Type 1 Outpatient Diabetes medications: Novolog 70/30 11 units bid (was using pens)  Current orders for Inpatient glycemic control:  Novolog sensitive tid with meals and HS, Novolog 5 units tid with meals, Novolog 70/30 mix 11 units bid Inpatient Diabetes Program Recommendations:   Please consider d/c of Novolog meal coverage. Text page sent.   Upon D/C, he will need new Rx. For Novolog 70/30, insulin syringes, and glucose meter strips.    Thanks,  Adah Perl, RN, BC-ADM Inpatient Diabetes Coordinator Pager [phone] (8a-5p)

## 2017-06-18 NOTE — Care Management (Signed)
Patient to discharge home today.  Previous admission scripts for insulin were sent to Medication Management .  RNCM confirmed with Medication Management  And Patient that those were not picked up.  Patient states "I already had some, and I had a boil on my face so I didn't go get it".  Patient states that he will pick up his insulin after this admission.  RNCM confirmed with Medication Management  That insulin is in stock.  Scripts sent over.  Patient confirms that he has glucometer in the home, and still follows up at [facility] clinic.  RNCM signing off.

## 2017-06-18 NOTE — Progress Notes (Signed)
Pt discharged per MD order. Pt instructed on how to care for drain and given supplies. Prescription given to pt for antibiotic and pain medication. Prescription given for insulin that pt is to have filled per instruction from care management. Discharge instructions reviewed with pt. IV removed. Pt declined wheelchair and chose to walk to car.

## 2017-06-20 LAB — AEROBIC/ANAEROBIC CULTURE W GRAM STAIN (SURGICAL/DEEP WOUND)

## 2017-06-20 LAB — AEROBIC/ANAEROBIC CULTURE (SURGICAL/DEEP WOUND)

## 2017-06-21 LAB — CULTURE, BLOOD (ROUTINE X 2)
CULTURE: NO GROWTH
Culture: NO GROWTH

## 2017-06-26 ENCOUNTER — Encounter: Payer: Self-pay | Admitting: Surgery

## 2017-06-26 ENCOUNTER — Ambulatory Visit (INDEPENDENT_AMBULATORY_CARE_PROVIDER_SITE_OTHER): Payer: Self-pay | Admitting: Surgery

## 2017-06-26 VITALS — BP 128/85 | HR 116 | Temp 98.0°F | Ht 68.0 in | Wt 130.0 lb

## 2017-06-26 DIAGNOSIS — Z09 Encounter for follow-up examination after completed treatment for conditions other than malignant neoplasm: Secondary | ICD-10-CM

## 2017-06-26 NOTE — Progress Notes (Signed)
06/26/2017  HPI: Patient is s/p I&D of perirectal abscess with Dr. Burt Knack on 2/3.  He presents for follow up.  He is taking his antibiotics.  He describes still having some drainage from the wound.  He does have some itching and discomfort from the drain.  Denies any fevers or chills.  Vital signs: BP 128/85   Pulse (!) 116   Temp 98 F (36.7 C) (Oral)   Ht 5\' 8"  (1.727 m)   Wt 59 kg (130 lb)   BMI 19.77 kg/m    Physical Exam: Constitutional: No acute distress Rectal:  There is still some purulent drainage, though thin, from the perianal incision.  No drainage from counter incision.  Skin induration much improved and there was no tenderness on palpation.  Penrose drain in place and secured with sutures.  Applied ABD pad over  Assessment/Plan: 22 yo male s/p I&D of perirectal abscess  --discussed with patient that given purulent drainage, would rather keep the drain in place still to allow for better drainage. --continue antibiotic course until complete --can do sitz baths to help with drainage. --follow up next week for possible drain removal if he improves.   Jonathan Mcmillan, [place]

## 2017-06-26 NOTE — Patient Instructions (Signed)
Finish antibiotics completely. You may take Ibuprofen and or Tylenol as needed for pain.   We will see you back as listed below:  How to Take a Sitz Bath A sitz bath is a warm water bath that is taken while you are sitting down. The water should only come up to your hips and should cover your buttocks. Your health care provider may recommend a sitz bath to help you:  Clean the lower part of your body, including your genital area.  With itching.  With pain.  With sore muscles or muscles that tighten or spasm.  How to take a sitz bath Take 3-4 sitz baths per day or as told by your health care provider. 1. Partially fill a bathtub with warm water. You will only need the water to be deep enough to cover your hips and buttocks when you are sitting in it. 2. If your health care provider told you to put medicine in the water, follow the directions exactly. 3. Sit in the water and open the tub drain a little. 4. Turn on the warm water again to keep the tub at the correct level. Keep the water running constantly. 5. Soak in the water for 15-20 minutes or as told by your health care provider. 6. After the sitz bath, pat the affected area dry first. Do not rub it. 7. Be careful when you stand up after the sitz bath because you may feel dizzy.  Contact a health care provider if:  Your symptoms get worse. Do not continue with sitz baths if your symptoms get worse.  You have new symptoms. Do not continue with sitz baths until you talk with your health care provider. This information is not intended to replace advice given to you by your health care provider. Make sure you discuss any questions you have with your health care provider. Document Released: 01/21/2004 Document Revised: 09/28/2015 Document Reviewed: 04/28/2014 Elsevier Interactive Patient Education  [employer].

## 2017-06-26 NOTE — Discharge Summary (Addendum)
Physician Discharge Summary  Patient ID: Jonathan Mcmillan MRN: [id number] DOB/AGE: February 27, 1996 22 y.o.  Admit date: 06/16/2017 Discharge date: 06/18/2017  Admission Diagnoses:  Discharge Diagnoses:  Active Problems:   Perirectal abscess   Discharged Condition: good  Hospital Course: 22 y.o. male with poorly-controlled DM and a history of medical non-compliance presented to [facility] ED for peri-rectal pain. Workup was found to be significant for CT imaging demonstrating a large peri-rectal abscess with inadequate spontaneous drainage. Informed consent was obtained and documented, and patient underwent operative incision and drainage of his large peri-rectal abscess with placement of Penrose drain Burt Knack, 06/16/2017).  Post-operatively, patient's pain improved and advancement of patient's diet and ambulation were well-tolerated. Patient subsequently remained a few days for IV antibiotics and for improved control of his blood glucose (DM). The remainder of patient's hospital course was essentially unremarkable, and discharge planning was initiated accordingly with patient safely able to be discharged home with appropriate discharge instructions, antibiotics, pain control, and outpatient surgical follow-up after all of his and family's questions were answered to their expressed satisfaction.  Consults: hospitalist/internal medicine  Significant Diagnostic Studies: radiology: CT scan: peri-rectal abscess  Treatments: antibiotics: Zosyn and surgery: incision and drainage of peri-rectal abscess  Discharge Exam: Blood pressure (!) 153/97, pulse 81, temperature 97.8 F (36.6 C), temperature source Oral, resp. rate 20, height 5\' 8"  (1.727 m), weight 135 lb (61.2 kg), SpO2 100 %. General appearance: alert, cooperative, appears stated age and no distress GI: soft, non-tender; bowel sounds normal; no masses,  no organomegaly  Anorectal: Penrose drain well-secured with appropriate peri-incisional tenderness to  palpation, no surrounding erythema or further purulent drainage  Disposition: 01-Home or Self Care   Allergies as of 06/18/2017   No Known Allergies     Medication List    STOP taking these medications   amoxicillin-clavulanate 875-125 MG tablet Commonly known as:  AUGMENTIN   insulin glargine 100 UNIT/ML injection Commonly known as:  LANTUS   oxyCODONE 5 MG immediate release tablet Commonly known as:  Oxy IR/ROXICODONE   oxyCODONE-acetaminophen 7.5-325 MG tablet Commonly known as:  PERCOCET Replaced by:  oxyCODONE-acetaminophen 5-325 MG tablet     TAKE these medications   acetaminophen 325 MG tablet Commonly known as:  TYLENOL Take 2 tablets (650 mg total) by mouth every 6 (six) hours as needed for mild pain (or Fever >/= 101).   docusate sodium 100 MG capsule Commonly known as:  COLACE Take 1 capsule (100 mg total) by mouth 2 (two) times daily as needed for mild constipation.   docusate sodium 100 MG capsule Commonly known as:  COLACE Take 1 capsule (100 mg total) by mouth daily as needed.   doxycycline 100 MG tablet Commonly known as:  VIBRA-TABS Take 100 mg by mouth 2 (two) times daily.   insulin aspart protamine- aspart (70-30) 100 UNIT/ML injection Commonly known as:  NOVOLOG MIX 70/30 Inject 0.11 mLs (11 Units total) into the skin 2 (two) times daily with a meal.   insulin lispro 100 UNIT/ML injection Commonly known as:  HUMALOG 5 units 3 times a day with meals   multivitamin with minerals Tabs tablet Take 1 tablet by mouth daily.   oxyCODONE-acetaminophen 5-325 MG tablet Commonly known as:  PERCOCET/ROXICET Take 1 tablet by mouth every 4 (four) hours as needed for severe pain. Replaces:  oxyCODONE-acetaminophen 7.5-325 MG tablet     ASK your doctor about these medications   amoxicillin-clavulanate 875-125 MG tablet Commonly known as:  AUGMENTIN Take 1 tablet by  mouth 2 (two) times daily for 5 days. Ask about: Should I take this medication?       Follow-up Information    Olean Ree, MD. Go on 06/26/2017.   Specialty:  Surgery Why:  Wednesday at 2:15pm for hospital follow-up Contact information: [address] [place] [address] [phone]           Signed: Vickie Epley 06/26/2017, 7:30 AM

## 2017-07-03 ENCOUNTER — Ambulatory Visit (INDEPENDENT_AMBULATORY_CARE_PROVIDER_SITE_OTHER): Payer: Self-pay | Admitting: Surgery

## 2017-07-03 ENCOUNTER — Encounter: Payer: Self-pay | Admitting: Surgery

## 2017-07-03 VITALS — BP 148/96 | HR 121 | Ht 68.0 in | Wt 133.6 lb

## 2017-07-03 DIAGNOSIS — Z09 Encounter for follow-up examination after completed treatment for conditions other than malignant neoplasm: Secondary | ICD-10-CM

## 2017-07-03 MED ORDER — AMOXICILLIN-POT CLAVULANATE 875-125 MG PO TABS: 1.0000 | ORAL_TABLET | Freq: Two times a day (BID) | ORAL | 0 refills | Status: DC

## 2017-07-03 MED ORDER — GABAPENTIN 300 MG PO CAPS: 300.0000 mg | ORAL_CAPSULE | Freq: Three times a day (TID) | ORAL | 0 refills | Status: DC

## 2017-07-03 MED ORDER — GABAPENTIN 300 MG PO CAPS
300.0000 mg | ORAL_CAPSULE | Freq: Three times a day (TID) | ORAL | 0 refills | Status: DC
Start: 2017-07-03 — End: 2017-07-03

## 2017-07-03 NOTE — Patient Instructions (Signed)
Please continue to wash the area with soap and water twice a day and after each bowel movement.  Please pick up your medication at the pharmacy and begin taking it today.   Please see your follow up appointment listed below.

## 2017-07-03 NOTE — Progress Notes (Signed)
S/p I/D perirectal abscess by Dr. Burt Knack, drain fell out, he is not on any A/Bs There has been some drainage No fevers, + PO  PE NAD, cachectic Rectal w induration to the left side of his buttocks, small wound where drain was. No evidence of further collection  A/p persistnet induration on perianal infection, we will prescribe augmentin, he wishes to have some gabapentin as well as adjuvant pain medication F/W w Dr. Burt Knack next week.

## 2017-07-11 ENCOUNTER — Encounter: Payer: Self-pay | Admitting: Surgery

## 2017-07-11 ENCOUNTER — Ambulatory Visit (INDEPENDENT_AMBULATORY_CARE_PROVIDER_SITE_OTHER): Payer: Self-pay | Admitting: Surgery

## 2017-07-11 VITALS — BP 119/85 | HR 105 | Temp 98.2°F | Ht 68.0 in | Wt 130.0 lb

## 2017-07-11 DIAGNOSIS — K611 Rectal abscess: Secondary | ICD-10-CM

## 2017-07-11 NOTE — Patient Instructions (Addendum)
Finish your antibiotics your last day for these should be 07/13/17 and continue to keep this area clean and dry at all times.   We will not need to see you back, however if you have any questions or concerns please give our office a call.

## 2017-07-11 NOTE — Progress Notes (Signed)
Surgical Clinic Progress/Follow-up Note   HPI:  22 y.o. Male presents to clinic for follow-up evaluation s/p incision and drainage of peri-rectal abscess Burt Knack, 2/3). At patient's prior appointment 1 week ago, he reported his surgically placed Penrose drain "fell out", and his antibiotics were extended by 10 days for persistent Right buttock induration. Patient describes complete resolution of his peri-rectal pain without any further drainage and denies any fever/chills, diarrhea, constipation, N/V, CP, or SOB. He says he's been controlling his blood glucose better (100 - 200) with insulin and has 2 days left of the Augmentin he was prescribed.  Review of Systems:  Constitutional: denies any other weight loss, fever, chills, or sweats  Eyes: denies any other vision changes, history of eye injury  ENT: denies sore throat, hearing problems  Respiratory: denies shortness of breath, wheezing  Cardiovascular: denies chest pain, palpitations  Gastrointestinal: abdominal pain, N/V, and bowel function as per HPI Musculoskeletal: denies any other joint pains or cramps  Skin: Denies any other rashes or skin discolorations  Neurological: denies any other headache, dizziness, weakness  Psychiatric: denies any other depression, anxiety  All other review of systems: otherwise negative   Vital Signs:  BP 119/85   Pulse (!) 105   Temp 98.2 F (36.8 C) (Oral)   Ht 5\' 8"  (1.727 m)   Wt 130 lb (59 kg)   BMI 19.77 kg/m    Physical Exam:  Constitutional:  -- Normal body habitus  -- Awake, alert, and oriented x3  Eyes:  -- Pupils equally round and reactive to light  -- No scleral icterus  Ear, nose, throat:  -- No jugular venous distension  -- No nasal drainage, bleeding Pulmonary:  -- No crackles -- Equal breath sounds bilaterally -- Breathing non-labored at rest Cardiovascular:  -- S1, S2 present  -- No pericardial rubs  Gastrointestinal:  -- Soft, nontender, non-distended, no  guarding/rebound  -- No abdominal masses appreciated, pulsatile or otherwise  Musculoskeletal / Integumentary:  -- Wounds or skin discoloration: Non-tender induration/scarring along prior drain tract and abscess cavity with prior surgical incisions well-approximated without any erythema or drainage -- Extremities: B/L UE and LE FROM, hands and feet warm, no edema  Neurologic:  -- Motor function: intact and symmetric  -- Sensation: intact and symmetric   Laboratory studies:  CBC Latest Ref Rng & Units 06/16/2017 06/03/2017 03/06/2017  WBC 3.8 - 10.6 K/uL 9.3 19.2(H) 17.0(H)  Hemoglobin 13.0 - 18.0 g/dL 10.0(L) 11.8(L) 13.4  Hematocrit 40.0 - 52.0 % 29.2(L) 35.9(L) 41.3  Platelets 150 - 440 K/uL 634(H) 194 414   CMP Latest Ref Rng & Units 06/16/2017 06/03/2017 03/06/2017  Glucose 65 - 99 mg/dL 420(H) 537(HH) 545(HH)  BUN 6 - 20 mg/dL <5(L) 9 5(L)  Creatinine 0.61 - 1.24 mg/dL 0.48(L) 0.75 0.57(L)  Sodium 135 - 145 mmol/L 131(L) 128(L) 130(L)  Potassium 3.5 - 5.1 mmol/L 3.2(L) 3.4(L) 4.0  Chloride 101 - 111 mmol/L 96(L) 93(L) 92(L)  CO2 22 - 32 mmol/L 26 24 27   Calcium 8.9 - 10.3 mg/dL 8.3(L) 8.8(L) 9.2  Total Protein 6.5 - 8.1 g/dL - - 8.1  Total Bilirubin 0.3 - 1.2 mg/dL - - 0.7  Alkaline Phos 38 - 126 U/L - - 166(H)  AST 15 - 41 U/L - - 15  ALT 17 - 63 U/L - - 11(L)    Assessment:  22 y.o. yo Male with a problem list including...  Patient Active Problem List   Diagnosis Date Noted  . Perirectal abscess  06/16/2017  . Sepsis ([place]) 03/06/2017  . Malnutrition of moderate degree 05/22/2016  . DKA (diabetic ketoacidoses) ([place]) 05/21/2016  . Type I (juvenile type) diabetes mellitus without mention of complication, uncontrolled 10/05/2010  . Hypertension 10/05/2010  . Obesity 10/05/2010    presents to clinic for follow-up evaluation, doing well s/p incision and drainage for peri-rectal abscess in the context of previously very poorly controlled diabetes mellitus, now reportedly better  controlled.  Plan:   - continue to shower and keep affected sites clean and dry  - complete prescribed course of antibiotics even if feeling better/well   - additional follow-up appointment following completion of antibiotics offered, but patient declines  - return to clinic as needed, instructed to call office if any questions or concerns  - importance of strict blood glucose control discussed and reinforced  All of the above recommendations were discussed with the patient, and all of patient's questions were answered to his expressed satisfaction.  -- Marilynne Drivers Rosana Hoes, MD, [place]: [facility] for exceptional care. Office: [phone]

## 2017-07-22 ENCOUNTER — Encounter: Payer: Self-pay | Admitting: Surgery

## 2017-12-04 LAB — HIV ANTIBODY (ROUTINE TESTING W REFLEX): HIV Screen 4th Generation wRfx: NONREACTIVE

## 2018-01-26 ENCOUNTER — Inpatient Hospital Stay
Admission: EM | Admit: 2018-01-26 | Discharge: 2018-01-27 | DRG: 603 | Disposition: A | Discharge status: Home / Self Care | Payer: Self-pay | Attending: Internal Medicine | Admitting: Internal Medicine

## 2018-01-26 ENCOUNTER — Encounter: Payer: Self-pay | Admitting: Emergency Medicine

## 2018-01-26 ENCOUNTER — Other Ambulatory Visit: Payer: Self-pay

## 2018-01-26 DIAGNOSIS — L0291 Cutaneous abscess, unspecified: Secondary | ICD-10-CM

## 2018-01-26 DIAGNOSIS — R000 Tachycardia, unspecified: Secondary | ICD-10-CM | POA: Diagnosis present

## 2018-01-26 DIAGNOSIS — E860 Dehydration: Secondary | ICD-10-CM | POA: Diagnosis present

## 2018-01-26 DIAGNOSIS — F17210 Nicotine dependence, cigarettes, uncomplicated: Secondary | ICD-10-CM | POA: Diagnosis present

## 2018-01-26 DIAGNOSIS — E1365 Other specified diabetes mellitus with hyperglycemia: Secondary | ICD-10-CM | POA: Diagnosis present

## 2018-01-26 DIAGNOSIS — E871 Hypo-osmolality and hyponatremia: Secondary | ICD-10-CM | POA: Diagnosis present

## 2018-01-26 DIAGNOSIS — A419 Sepsis, unspecified organism: Secondary | ICD-10-CM

## 2018-01-26 DIAGNOSIS — Z794 Long term (current) use of insulin: Secondary | ICD-10-CM

## 2018-01-26 DIAGNOSIS — L03113 Cellulitis of right upper limb: Secondary | ICD-10-CM | POA: Diagnosis present

## 2018-01-26 DIAGNOSIS — R739 Hyperglycemia, unspecified: Secondary | ICD-10-CM

## 2018-01-26 DIAGNOSIS — L02413 Cutaneous abscess of right upper limb: Principal | ICD-10-CM | POA: Diagnosis present

## 2018-01-26 DIAGNOSIS — E876 Hypokalemia: Secondary | ICD-10-CM | POA: Diagnosis present

## 2018-01-26 HISTORY — DX: Cellulitis of right upper limb: L03.113

## 2018-01-26 LAB — GLUCOSE, CAPILLARY
GLUCOSE-CAPILLARY: 396 mg/dL — AB (ref 70–99)
Glucose-Capillary: >600 mg/dL (ref 70–99)
Glucose-Capillary: 435 mg/dL — ABNORMAL HIGH (ref 70–99)
Glucose-Capillary: 199 mg/dL — ABNORMAL HIGH (ref 70–99)

## 2018-01-26 LAB — CBC
HCT: 39.9 % — ABNORMAL LOW (ref 40.0–52.0)
HEMOGLOBIN: 13.3 g/dL (ref 13.0–18.0)
MCH: 29.3 pg (ref 26.0–34.0)
MCHC: 33.5 g/dL (ref 32.0–36.0)
MCV: 87.6 fL (ref 80.0–100.0)
Platelets: 335 10*3/uL (ref 150–440)
RBC: 4.55 MIL/uL (ref 4.40–5.90)
RDW: 12.9 % (ref 11.5–14.5)
WBC: 11.8 10*3/uL — AB (ref 3.8–10.6)

## 2018-01-26 LAB — BASIC METABOLIC PANEL
ANION GAP: 12 (ref 5–15)
BUN: 8 mg/dL (ref 6–20)
CHLORIDE: 91 mmol/L — AB (ref 98–111)
CO2: 24 mmol/L (ref 22–32)
Calcium: 9.1 mg/dL (ref 8.9–10.3)
Creatinine, Ser: 0.93 mg/dL (ref 0.61–1.24)
GFR calc Af Amer: >60 mL/min (ref >60)
GFR calc non Af Amer: >60 mL/min (ref >60)
Glucose, Bld: 661 mg/dL (ref 70–99)
POTASSIUM: 3.5 mmol/L (ref 3.5–5.1)
SODIUM: 127 mmol/L — AB (ref 135–145)

## 2018-01-26 LAB — URINALYSIS, COMPLETE (UACMP) WITH MICROSCOPIC
BILIRUBIN URINE: NEGATIVE
Bacteria, UA: NONE SEEN
Glucose, UA: >=500 mg/dL — AB
KETONES UR: NEGATIVE mg/dL
LEUKOCYTES UA: NEGATIVE
Nitrite: NEGATIVE
PROTEIN: NEGATIVE mg/dL
SQUAMOUS EPITHELIAL / LPF: NONE SEEN (ref 0–5)
Specific Gravity, Urine: 1.023 (ref 1.005–1.030)
pH: 7 (ref 5.0–8.0)

## 2018-01-26 LAB — LACTIC ACID, PLASMA: Lactic Acid, Venous: 1.1 mmol/L (ref 0.5–1.9)

## 2018-01-26 MED ORDER — INSULIN ASPART 100 UNIT/ML ~~LOC~~ SOLN
5.0000 [IU] | Freq: Once | SUBCUTANEOUS | Status: AC
  Administered 2018-01-26: 5 [IU] via INTRAVENOUS
  Filled 2018-01-26: qty 1

## 2018-01-26 MED ORDER — POLYETHYLENE GLYCOL 3350 17 G PO PACK: 17.0000 g | PACK | Freq: Every day | ORAL | Status: DC | PRN

## 2018-01-26 MED ORDER — OXYCODONE HCL 5 MG PO TABS
5.0000 mg | ORAL_TABLET | ORAL | Status: DC | PRN
  Administered 2018-01-26: 5 mg via ORAL
  Filled 2018-01-26 (×2): qty 1

## 2018-01-26 MED ORDER — INSULIN ASPART 100 UNIT/ML ~~LOC~~ SOLN
5.0000 [IU] | Freq: Once | SUBCUTANEOUS | Status: AC
  Administered 2018-01-26: 5 [IU] via SUBCUTANEOUS
  Filled 2018-01-26: qty 1

## 2018-01-26 MED ORDER — ONDANSETRON HCL 4 MG PO TABS: 4.0000 mg | ORAL_TABLET | Freq: Four times a day (QID) | ORAL | Status: DC | PRN

## 2018-01-26 MED ORDER — SODIUM CHLORIDE 0.9 % IV BOLUS
1000.0000 mL | Freq: Once | INTRAVENOUS | Status: AC
  Administered 2018-01-26: 1000 mL via INTRAVENOUS

## 2018-01-26 MED ORDER — ONDANSETRON HCL 4 MG/2ML IJ SOLN: 4.0000 mg | Freq: Four times a day (QID) | INTRAMUSCULAR | Status: DC | PRN

## 2018-01-26 MED ORDER — VANCOMYCIN HCL IN DEXTROSE 1-5 GM/200ML-% IV SOLN
1000.0000 mg | Freq: Once | INTRAVENOUS | Status: AC
  Administered 2018-01-26: 1000 mg via INTRAVENOUS
  Filled 2018-01-26: qty 200

## 2018-01-26 MED ORDER — ACETAMINOPHEN 650 MG RE SUPP: 650.0000 mg | Freq: Four times a day (QID) | RECTAL | Status: DC | PRN

## 2018-01-26 MED ORDER — INSULIN ASPART 100 UNIT/ML ~~LOC~~ SOLN
10.0000 [IU] | Freq: Once | SUBCUTANEOUS | Status: AC
  Administered 2018-01-26: 10 [IU] via SUBCUTANEOUS
  Filled 2018-01-26: qty 1

## 2018-01-26 MED ORDER — INSULIN ASPART 100 UNIT/ML ~~LOC~~ SOLN
SUBCUTANEOUS | Status: AC
  Filled 2018-01-26: qty 1

## 2018-01-26 MED ORDER — INSULIN ASPART 100 UNIT/ML ~~LOC~~ SOLN: 0.0000 [IU] | Freq: Every day | SUBCUTANEOUS | Status: DC

## 2018-01-26 MED ORDER — INSULIN ASPART 100 UNIT/ML ~~LOC~~ SOLN
0.0000 [IU] | Freq: Three times a day (TID) | SUBCUTANEOUS | Status: DC
  Administered 2018-01-27: 8 [IU] via SUBCUTANEOUS
  Administered 2018-01-27: 11 [IU] via SUBCUTANEOUS
  Filled 2018-01-26 (×2): qty 1

## 2018-01-26 MED ORDER — SODIUM CHLORIDE 0.9 % IV SOLN
INTRAVENOUS | Status: DC
  Administered 2018-01-26 – 2018-01-27 (×2): via INTRAVENOUS

## 2018-01-26 MED ORDER — ENOXAPARIN SODIUM 40 MG/0.4ML ~~LOC~~ SOLN
40.0000 mg | SUBCUTANEOUS | Status: DC
  Filled 2018-01-26: qty 0.4

## 2018-01-26 MED ORDER — ACETAMINOPHEN 325 MG PO TABS: 650.0000 mg | ORAL_TABLET | Freq: Four times a day (QID) | ORAL | Status: DC | PRN

## 2018-01-26 MED ORDER — VANCOMYCIN HCL IN DEXTROSE 1-5 GM/200ML-% IV SOLN
1000.0000 mg | Freq: Two times a day (BID) | INTRAVENOUS | Status: DC
  Administered 2018-01-26 – 2018-01-27 (×2): 1000 mg via INTRAVENOUS
  Filled 2018-01-26 (×3): qty 200

## 2018-01-26 MED ORDER — SODIUM CHLORIDE 0.9 % IV SOLN
2.0000 g | INTRAVENOUS | Status: DC
  Administered 2018-01-26 – 2018-01-27 (×2): 2 g via INTRAVENOUS
  Filled 2018-01-26 (×2): qty 2

## 2018-01-26 MED ORDER — MORPHINE SULFATE (PF) 4 MG/ML IV SOLN
4.0000 mg | INTRAVENOUS | Status: DC | PRN
  Administered 2018-01-26 – 2018-01-27 (×4): 4 mg via INTRAVENOUS
  Filled 2018-01-26 (×4): qty 1

## 2018-01-26 MED ORDER — INSULIN GLARGINE 100 UNIT/ML ~~LOC~~ SOLN
15.0000 [IU] | Freq: Every day | SUBCUTANEOUS | Status: DC
  Administered 2018-01-26: 15 [IU] via SUBCUTANEOUS
  Filled 2018-01-26: qty 0.15

## 2018-01-26 NOTE — ED Triage Notes (Signed)
Pt arrived to Ed via POV. Pt c/o pain 10/10. Pt noticed abscess on R/arm.  Upon assessment this Rn noticed swelling on R/arm, below the elbow. No discharged noted. Pt states he has not taking insulin in a month.

## 2018-01-26 NOTE — H&P (Addendum)
[facility] [place] [facility] [place] NAME: Jonathan Mcmillan    MR#:  [id number]  DATE OF BIRTH:  August 02, 1995  DATE OF ADMISSION:  01/26/2018  PRIMARY CARE PHYSICIAN: [facility] [place]   REQUESTING/REFERRING PHYSICIAN: Larae Grooms, MD  CHIEF COMPLAINT:  Right arm abscess   HISTORY OF PRESENT ILLNESS:  Jonathan Mcmillan  is a 22 y.o. 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 He first noticed the abscess last week and it has continued to get larger. He then noticed that his right forearm started swelling and became warm to the touch. He denies any fevers, but has had chills for the last couple of days. No nausea or vomiting. He has not taking any insulin for the last month because "he has been too busy". He has not checked his blood sugars in over a month. He endorses polydipsia, but denies polyuria.  In the ED, he was afebrile but tachycardic to the 120s. Labs were significant for WBC 11.8 and glucose 661. Blood cultures were drawn. He was given a 1L NS bolus and he was started on vancomycin and ceftriaxone. Hospitalist team was called for admission.  PAST MEDICAL HISTORY:   Past Medical History:  Diagnosis Date  . Diabetes 1.5, managed as type 1 ([place])   . Perirectal abscess 06/16/2017    PAST SURGICAL HISTORY:   Past Surgical History:  Procedure Laterality Date  . INCISION AND DRAINAGE PERIRECTAL ABSCESS N/A 06/16/2017   Procedure: IRRIGATION AND DEBRIDEMENT PERIRECTAL ABSCESS;  Surgeon: Florene Glen, MD;  Location: [facility] ORS;  Service: General;  Laterality: N/A;  . none      SOCIAL HISTORY:   Social History   Tobacco Use  . Smoking status: Current Every Day Smoker    Packs/day: 0.50    Types: Cigarettes  . Smokeless tobacco: Never Used  Substance Use Topics  . Alcohol use: No    FAMILY HISTORY:   Family History  Problem Relation Age of  Onset  . Diabetes Mother     DRUG ALLERGIES:  No Known Allergies  REVIEW OF SYSTEMS:   Review of Systems  Constitutional: Positive for chills. Negative for fever.  HENT: Negative for congestion and sore throat.   Eyes: Negative for blurred vision and double vision.  Respiratory: Negative for cough and shortness of breath.   Cardiovascular: Negative for chest pain, palpitations and leg swelling.  Gastrointestinal: Negative for abdominal pain, nausea and vomiting.  Genitourinary: Negative for dysuria and frequency.  Musculoskeletal: Negative for back pain and neck pain.  Neurological: Negative for dizziness and headaches.  Psychiatric/Behavioral: Negative for depression. The patient is not nervous/anxious.     MEDICATIONS AT HOME:   Prior to Admission medications   Medication Sig Start Date End Date Taking? Authorizing Provider  insulin aspart protamine- aspart (NOVOLOG MIX 70/30) (70-30) 100 UNIT/ML injection Inject 0.11 mLs (11 Units total) into the skin 2 (two) times daily with a meal. Patient taking differently: Inject 10-13 Units into the skin 2 (two) times daily with a meal. Sliding scale based on carbs. 06/18/17  Yes Fritzi Mandes, MD  acetaminophen (TYLENOL) 325 MG tablet Take 2 tablets (650 mg total) by mouth every 6 (six) hours as needed for mild pain (or Fever >/= 101). Patient not taking: Reported on 01/26/2018 03/08/17   Nicholes Mango, MD  amoxicillin-clavulanate (AUGMENTIN) 875-125 MG tablet Take 1 tablet by mouth  2 (two) times daily. Patient not taking: Reported on 01/26/2018 07/03/17   Caroleen Hamman F, MD  docusate sodium (COLACE) 100 MG capsule Take 1 capsule (100 mg total) by mouth 2 (two) times daily as needed for mild constipation. Patient not taking: Reported on 01/26/2018 03/08/17   Nicholes Mango, MD  docusate sodium (COLACE) 100 MG capsule Take 1 capsule (100 mg total) by mouth daily as needed. Patient not taking: Reported on 01/26/2018 06/03/17 06/03/18  Darel Hong, MD   gabapentin (NEURONTIN) 300 MG capsule Take 1 capsule (300 mg total) by mouth 3 (three) times daily. Patient not taking: Reported on 07/11/2017 07/03/17   Caroleen Hamman F, MD  insulin lispro (HUMALOG) 100 UNIT/ML injection 5 units 3 times a day with meals Patient not taking: Reported on 01/26/2018 03/08/17 03/08/18  Nicholes Mango, MD  Multiple Vitamin (MULTIVITAMIN WITH MINERALS) TABS tablet Take 1 tablet by mouth daily. Patient not taking: Reported on 01/26/2018 03/09/17   Nicholes Mango, MD      VITAL SIGNS:  Blood pressure 135/80, pulse (!) 123, temperature 98.5 F (36.9 C), temperature source Oral, height 5\' 8"  (1.727 m), weight 63.5 kg, SpO2 99 %.  PHYSICAL EXAMINATION:  Physical Exam  GENERAL:  22 y.o.-year-old patient lying in the bed with no acute distress.  EYES: Pupils equal, round, reactive to light and accommodation. No scleral icterus. Extraocular muscles intact. mildy dry mucous membranes. HEENT: Head atraumatic, normocephalic. Oropharynx and nasopharynx clear.  NECK:  Supple, no jugular venous distention. No thyroid enlargement, no tenderness.  LUNGS: Normal breath sounds bilaterally, no wheezing, rales,rhonchi or crepitation. No use of accessory muscles of respiration.  CARDIOVASCULAR: Tachycardic, regular rhythm, S1, S2 normal. No murmurs, rubs, or gallops.  ABDOMEN: Soft, nontender, nondistended. Bowel sounds present. No organomegaly or mass.  EXTREMITIES: No pedal edema, cyanosis, or clubbing. +right forearm swelling, warmth, and induration, s/p I&D with packing. NEUROLOGIC: Cranial nerves II through XII are intact. Muscle strength 5/5 in all extremities. Sensation intact. Gait not checked.  PSYCHIATRIC: The patient is alert and oriented x 3.  SKIN: No obvious rash, lesion, or ulcer.   LABORATORY PANEL:   CBC Recent Labs  Lab 01/26/18 1503  WBC 11.8*  HGB 13.3  HCT 39.9*  PLT 335    ------------------------------------------------------------------------------------------------------------------  Chemistries  Recent Labs  Lab 01/26/18 1503  NA 127*  K 3.5  CL 91*  CO2 24  GLUCOSE 661*  BUN 8  CREATININE 0.93  CALCIUM 9.1   ------------------------------------------------------------------------------------------------------------------  Cardiac Enzymes No results for input(s): TROPONINI in the last 168 hours. ------------------------------------------------------------------------------------------------------------------  RADIOLOGY:  No results found.    IMPRESSION AND PLAN:   Right forearm cellulitis and abscess- s/p I&D in the ED. Uncontrolled diabetes likely contributing. Not quite meeting sepsis criteria. Lactic acid normal. 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 Anion gap normal, so not in DKA. Will try to hold off on starting insulin drip. - will give another bolus and start IVFs - check A1c - lantus 15 units qhs and moderate SSI - consult diabetes coordinator  Sinus tachycardia- likely related to infection and dehydration - IVFs as above - check TSH  Hyponatremia- likely due to hyperglycemia, corrects to normal  - IVFs - monitor  All  the records are reviewed and case discussed with ED provider. Management plans discussed with the patient, family and they are in agreement.  CODE STATUS: Full  TOTAL TIME TAKING CARE OF THIS PATIENT: 35 minutes.    Berna Spare Mayo M.D on 01/26/2018 at 4:57 PM  Between 7am to 6pm - Pager - [phone]  After 6pm go to [email or web address] - Technical brewer [facility]  Office  [phone]  CC: Primary care physician; [facility]  [place]   Note: This dictation was prepared with Diplomatic Services operational officer dictation along with smaller phrase technology. Any transcriptional errors that result from this process are unintentional.

## 2018-01-26 NOTE — Progress Notes (Signed)
Pharmacy Antibiotic Note  AAYAN HASKEW is a 22 y.o. male admitted on 01/26/2018 with cellulitis.  Pharmacy has been consulted for Vancomycin dosing.  Plan: Vancomycin 1g IV every 12 hours.  Goal trough 10-15 mcg/mL.  Vancomycin trough prior to 5th dose  Height: 5\' 8"  (172.7 cm) Weight: 139 lb 15.9 oz (63.5 kg) IBW/kg (Calculated) : 68.4  Temp (24hrs), Avg:98.5 F (36.9 C), Min:98.2 F (36.8 C), Max:98.7 F (37.1 C)  Recent Labs  Lab 01/26/18 1503 01/26/18 1547  WBC 11.8*  --   CREATININE 0.93  --   LATICACIDVEN  --  1.1    Estimated Creatinine Clearance: 110 mL/min (by C-G formula based on SCr of 0.93 mg/dL).    No Known Allergies  Antimicrobials this admission: Vancomycin 9/15 >>  Ceftriaxone 9/15 >>   Thank you for allowing pharmacy to be a part of this patient's care.  Paulina Fusi, PharmD, BCPS 01/26/2018 9:03 PM

## 2018-01-26 NOTE — Progress Notes (Signed)
CODE SEPSIS - PHARMACY COMMUNICATION  **Broad Spectrum Antibiotics should be administered within 1 hour of Sepsis diagnosis**  Time Code Sepsis Called/Page Received: 15:45  Antibiotics Ordered: Ceftriaxone and Vancomycin  Time of 1st antibiotic administration: Ceftriaxone given at 16:29  Additional action taken by pharmacy: n/a  If necessary, Name of Provider/Nurse Contacted: n/a    Vira Blanco ,PharmD Clinical Pharmacist  01/26/2018  4:45 PM

## 2018-01-26 NOTE — ED Provider Notes (Addendum)
[facility] Emergency Department Provider Note  ____________________________________________   First MD Initiated Contact with Patient 01/26/18 1510     (approximate)  I have reviewed the triage vital signs and the nursing notes.   HISTORY  Chief Complaint No chief complaint on file.   HPI Jonathan Mcmillan is a 22 y.o. male with a history of diabetes who was presented to the emergency department today with a right forearm abscess.  He says that he has not taken his medications over the past month and a half.  Says that his entire right forearm is now swollen.  Denies fever.  Denies drainage from a swollen area that he said came go ahead just distal to the olecranon on the dorsal surface of the forearm.  He said he tried to pick at this area but was unable to have any exudate drained.  Past Medical History:  Diagnosis Date  . Diabetes 1.5, managed as type 1 ([place])   . Perirectal abscess 06/16/2017    Patient Active Problem List   Diagnosis Date Noted  . Sepsis ([place]) 03/06/2017  . Malnutrition of moderate degree 05/22/2016  . DKA (diabetic ketoacidoses) ([place]) 05/21/2016  . Type I (juvenile type) diabetes mellitus without mention of complication, uncontrolled 10/05/2010  . Hypertension 10/05/2010  . Obesity 10/05/2010    Past Surgical History:  Procedure Laterality Date  . INCISION AND DRAINAGE PERIRECTAL ABSCESS N/A 06/16/2017   Procedure: IRRIGATION AND DEBRIDEMENT PERIRECTAL ABSCESS;  Surgeon: Florene Glen, MD;  Location: [facility] ORS;  Service: General;  Laterality: N/A;  . none      Prior to Admission medications   Medication Sig Start Date End Date Taking? Authorizing Provider  acetaminophen (TYLENOL) 325 MG tablet Take 2 tablets (650 mg total) by mouth every 6 (six) hours as needed for mild pain (or Fever >/= 101). 03/08/17   Nicholes Mango, MD  amoxicillin-clavulanate (AUGMENTIN) 875-125 MG tablet Take 1 tablet by mouth 2 (two) times daily. 07/03/17    Pabon, Diego F, MD  docusate sodium (COLACE) 100 MG capsule Take 1 capsule (100 mg total) by mouth 2 (two) times daily as needed for mild constipation. 03/08/17   Nicholes Mango, MD  docusate sodium (COLACE) 100 MG capsule Take 1 capsule (100 mg total) by mouth daily as needed. 06/03/17 06/03/18  Darel Hong, MD  doxycycline (VIBRA-TABS) 100 MG tablet Take 100 mg by mouth 2 (two) times daily.    [provider]  gabapentin (NEURONTIN) 300 MG capsule Take 1 capsule (300 mg total) by mouth 3 (three) times daily. Patient not taking: Reported on 07/11/2017 07/03/17   Caroleen Hamman F, MD  insulin aspart protamine- aspart (NOVOLOG MIX 70/30) (70-30) 100 UNIT/ML injection Inject 0.11 mLs (11 Units total) into the skin 2 (two) times daily with a meal. 06/18/17   Fritzi Mandes, MD  insulin lispro (HUMALOG) 100 UNIT/ML injection 5 units 3 times a day with meals 03/08/17 03/08/18  Nicholes Mango, MD  Multiple Vitamin (MULTIVITAMIN WITH MINERALS) TABS tablet Take 1 tablet by mouth daily. 03/09/17   Nicholes Mango, MD    Allergies Patient has no known allergies.  Family History  Problem Relation Age of Onset  . Diabetes Mother     Social History Social History   Tobacco Use  . Smoking status: Current Every Day Smoker    Packs/day: 0.50    Types: Cigarettes  . Smokeless tobacco: Never Used  Substance Use Topics  . Alcohol use: No  . Drug use:  Yes    Types: Marijuana    Review of Systems  Constitutional: No fever/chills Eyes: No visual changes. ENT: No sore throat. Cardiovascular: Denies chest pain. Respiratory: Denies shortness of breath. Gastrointestinal: No abdominal pain.  No nausea, no vomiting.  No diarrhea.  No constipation. Genitourinary: Negative for dysuria. Musculoskeletal: Negative for back pain. Skin: As above Neurological: Negative for headaches, focal weakness or numbness.   ____________________________________________   PHYSICAL EXAM:  VITAL SIGNS: ED Triage  Vitals  Enc Vitals Group     BP 01/26/18 1457 135/80     Pulse Rate 01/26/18 1457 (!) 123     Resp --      Temp 01/26/18 1457 98.5 F (36.9 C)     Temp Source 01/26/18 1457 Oral     SpO2 01/26/18 1457 99 %     Weight 01/26/18 1458 140 lb (63.5 kg)     Height 01/26/18 1458 5\' 8"  (1.727 m)     Head Circumference --      Peak Flow --      Pain Score 01/26/18 1457 10     Pain Loc --      Pain Edu? --      Excl. in [place]? --     Constitutional: Alert and oriented. Well appearing and in no acute distress. Eyes: Conjunctivae are normal.  Head: Atraumatic. Nose: No congestion/rhinnorhea. Mouth/Throat: Mucous membranes are moist.  Neck: No stridor.   Cardiovascular: Tachycardic, regular rhythm. Grossly normal heart sounds.   Respiratory: Normal respiratory effort.  No retractions. Lungs CTAB. Gastrointestinal: Soft and nontender. No distention.  Musculoskeletal: No lower extremity tenderness nor edema.  No joint effusions. Neurologic:  Normal speech and language. No gross focal neurologic deficits are appreciated. Skin:   Swollen, fluctuant area just distal to the olecranon on the dorsal surface of the right forearm.  The area is approximately 3 x 4 cm without any drainage of exudate.  The rest of the forearm is swollen moderately and warm.  Psychiatric: Mood and affect are normal. Speech and behavior are normal.  ____________________________________________   LABS (all labs ordered are listed, but only abnormal results are displayed)  Labs Reviewed  GLUCOSE, CAPILLARY - Abnormal; Notable for the following components:      Result Value   Glucose-Capillary >600 (*)    All other components within normal limits  BASIC METABOLIC PANEL - Abnormal; Notable for the following components:   Sodium 127 (*)    Chloride 91 (*)    Glucose, Bld 661 (*)    All other components within normal limits  CBC - Abnormal; Notable for the following components:   WBC 11.8 (*)    HCT 39.9 (*)    All  other components within normal limits  CULTURE, BLOOD (ROUTINE X 2)  CULTURE, BLOOD (ROUTINE X 2)  URINALYSIS, COMPLETE (UACMP) WITH MICROSCOPIC  URINALYSIS, ROUTINE W REFLEX MICROSCOPIC  LACTIC ACID, PLASMA  LACTIC ACID, PLASMA  CBG MONITORING, ED   ____________________________________________  EKG  ED ECG REPORT I, Doran Stabler, the attending physician, personally viewed and interpreted this ECG.   Date: 01/26/2018  EKG Time: 1618  Rate: 108  Rhythm: sinus tachycardia  Axis: Normal  Intervals:none  ST&T Change: Mild and diffuse ST elevations consistent with benign early repolarization.  No abnormal T wave inversion.  No ST depression.  ____________________________________________  [id number]   ____________________________________________   PROCEDURES  Procedure(s) performed:   .Critical Care Performed by: Orbie Pyo, MD Authorized by: Orbie Pyo,  MD   Critical care provider statement:    Critical care time (minutes):  35   Critical care was time spent personally by me on the following activities:  Discussions with consultants, evaluation of patient's response to treatment, examination of patient, ordering and performing treatments and interventions, ordering and review of laboratory studies, ordering and review of radiographic studies, pulse oximetry, re-evaluation of patient's condition, obtaining history from patient or surrogate and review of old charts .Marland KitchenIncision and Drainage Date/Time: 01/26/2018 4:35 PM Performed by: Orbie Pyo, MD Authorized by: Orbie Pyo, MD   Consent:    Consent obtained:  Verbal   Consent given by:  Patient   Risks discussed:  Bleeding, infection, incomplete drainage and pain   Alternatives discussed:  Alternative treatment, delayed treatment and observation Location:    Type:  Abscess   Size:  3x4cm   Location:  Upper extremity   Upper extremity location:  Arm   Arm  location:  R lower arm Pre-procedure details:    Skin preparation:  Chloraprep Anesthesia (see MAR for exact dosages):    Anesthesia method:  Local infiltration   Local anesthetic:  Lidocaine 1% WITH epi Procedure type:    Complexity:  Complex Procedure details:    Needle aspiration: no     Incision types:  Single straight   Incision depth:  Subcutaneous   Scalpel blade:  11   Wound management:  Probed and deloculated, irrigated with saline and extensive cleaning   Drainage:  Purulent   Drainage amount:  Moderate   Wound treatment:  Wound left open   Packing materials:  1/2 in gauze   Amount 1/2":  4cm Post-procedure details:    Patient tolerance of procedure:  Tolerated well, no immediate complications    Critical Care performed:   ____________________________________________   INITIAL IMPRESSION / ASSESSMENT AND PLAN / ED COURSE  Pertinent labs & imaging results that were available during my care of the patient were reviewed by me and considered in my medical decision making (see chart for details).  DDX: Sepsis, abscess, cellulitis, DKA, hyperglycemia As part of my medical decision making, I reviewed the following data within the electronic MEDICAL RECORD NUMBER Notes from prior ED visits  ----------------------------------------- 4:36 PM on 01/26/2018 -----------------------------------------  Patient at this time tolerated the procedure well.  He has hyperglycemia as well as signs of sepsis including tachycardia and a high white blood cell count.  Will be admitted to the hospital.  Signed out to the hospitalist.  Patient aware of the diagnosis as well as treatment and willing to comply. ____________________________________________   FINAL CLINICAL IMPRESSION(S) / ED DIAGNOSES  Sepsis.  Cellulitis.  Abscess of the right forearm.  Hyperglycemia.    NEW MEDICATIONS STARTED DURING THIS VISIT:  New Prescriptions   No medications on file     Note:  This document  was prepared using Dragon voice recognition software and may include unintentional dictation errors.     Orbie Pyo, MD 01/26/18 1637    Berkeley Veldman, Randall An, MD 01/26/18 1726

## 2018-01-26 NOTE — Progress Notes (Addendum)
Received patient. Infusing 2 of 2 NS bolus as per ED RN's report. VS taken. Paged MD for BP and complaints of 8/10 pain. Informed regarding repeat CBG -435.  --- Will order pain meds prn and requested to recheck BP after admin. Will also place order for 10 units of Novolog SQ once.

## 2018-01-26 NOTE — ED Notes (Addendum)
Pt denies dizziness/ N/V/D.

## 2018-01-27 LAB — BASIC METABOLIC PANEL
ANION GAP: 7 (ref 5–15)
BUN: 5 mg/dL — ABNORMAL LOW (ref 6–20)
CO2: 27 mmol/L (ref 22–32)
Calcium: 8.4 mg/dL — ABNORMAL LOW (ref 8.9–10.3)
Chloride: 101 mmol/L (ref 98–111)
Creatinine, Ser: 0.49 mg/dL — ABNORMAL LOW (ref 0.61–1.24)
GFR calc Af Amer: >60 mL/min (ref >60)
GLUCOSE: 247 mg/dL — AB (ref 70–99)
POTASSIUM: 3.2 mmol/L — AB (ref 3.5–5.1)
SODIUM: 135 mmol/L (ref 135–145)

## 2018-01-27 LAB — TSH: TSH: 0.874 u[IU]/mL (ref 0.350–4.500)

## 2018-01-27 LAB — CBC
HEMATOCRIT: 34.4 % — AB (ref 40.0–52.0)
HEMOGLOBIN: 11.8 g/dL — AB (ref 13.0–18.0)
MCH: 29.3 pg (ref 26.0–34.0)
MCHC: 34.4 g/dL (ref 32.0–36.0)
MCV: 85.3 fL (ref 80.0–100.0)
Platelets: 286 10*3/uL (ref 150–440)
RBC: 4.04 MIL/uL — ABNORMAL LOW (ref 4.40–5.90)
RDW: 12.8 % (ref 11.5–14.5)
WBC: 13.5 10*3/uL — AB (ref 3.8–10.6)

## 2018-01-27 LAB — GLUCOSE, CAPILLARY
GLUCOSE-CAPILLARY: 290 mg/dL — AB (ref 70–99)
Glucose-Capillary: 307 mg/dL — ABNORMAL HIGH (ref 70–99)

## 2018-01-27 MED ORDER — INSULIN ASPART 100 UNIT/ML ~~LOC~~ SOLN
6.0000 [IU] | Freq: Three times a day (TID) | SUBCUTANEOUS | Status: DC
  Administered 2018-01-27: 6 [IU] via SUBCUTANEOUS
  Filled 2018-01-27: qty 1

## 2018-01-27 MED ORDER — SULFAMETHOXAZOLE-TRIMETHOPRIM 800-160 MG PO TABS: 1.0000 | ORAL_TABLET | Freq: Two times a day (BID) | ORAL | 0 refills | Status: DC

## 2018-01-27 MED ORDER — TRAMADOL HCL 50 MG PO TABS: 50.0000 mg | ORAL_TABLET | Freq: Four times a day (QID) | ORAL | 0 refills | Status: DC | PRN

## 2018-01-27 MED ORDER — POTASSIUM CHLORIDE CRYS ER 20 MEQ PO TBCR
40.0000 meq | EXTENDED_RELEASE_TABLET | Freq: Once | ORAL | Status: AC
  Administered 2018-01-27: 40 meq via ORAL
  Filled 2018-01-27: qty 2

## 2018-01-27 MED ORDER — CEPHALEXIN 500 MG PO CAPS: 500.0000 mg | ORAL_CAPSULE | Freq: Three times a day (TID) | ORAL | 0 refills | Status: AC

## 2018-01-27 MED ORDER — INSULIN LISPRO 100 UNIT/ML ~~LOC~~ SOLN: SUBCUTANEOUS | 1 refills | Status: DC

## 2018-01-27 MED ORDER — INSULIN GLARGINE 100 UNIT/ML ~~LOC~~ SOLN
17.0000 [IU] | Freq: Every day | SUBCUTANEOUS | Status: DC
  Filled 2018-01-27: qty 0.17

## 2018-01-27 MED ORDER — INSULIN ASPART PROT & ASPART (70-30 MIX) 100 UNIT/ML ~~LOC~~ SUSP: 14.0000 [IU] | Freq: Two times a day (BID) | SUBCUTANEOUS | 11 refills | Status: DC

## 2018-01-27 MED ORDER — INSULIN ASPART 100 UNIT/ML ~~LOC~~ SOLN
3.0000 [IU] | Freq: Three times a day (TID) | SUBCUTANEOUS | Status: DC
  Administered 2018-01-27: 3 [IU] via SUBCUTANEOUS
  Filled 2018-01-27: qty 1

## 2018-01-27 NOTE — Discharge Summary (Addendum)
Jonathan Mcmillan NAME: Jonathan Mcmillan    MR#:  [id number]  DATE OF BIRTH:  August 03, 1995  DATE OF ADMISSION:  01/26/2018 ADMITTING PHYSICIAN: Sela Hua, MD  DATE OF DISCHARGE: 01/27/2018  PRIMARY CARE PHYSICIAN: [facility] [place]    ADMISSION DIAGNOSIS:  Abscess [L02.91] Hyperglycemia [R73.9] Right forearm cellulitis [O70.962] Sepsis, due to unspecified organism ([place]) [A41.9]  DISCHARGE DIAGNOSIS:  Active Problems:   Right arm cellulitis   SECONDARY DIAGNOSIS:   Past Medical History:  Diagnosis Date  . Diabetes 1.5, managed as type 1 ([place])   . Perirectal abscess 06/16/2017    HOSPITAL COURSE:   22 year old male with tobacco dependence and insulin dependent diabetes who presents to the emergency room with right forearm abscess that was I&D in the ER.  1.  Right forearm abscess status post I&D in ER: Patient says that he has much improvement with the swelling and pain.  He is able to move his elbow.  Cellulitis is improved.  Patient will be discharged on oral Bactrim and clindamycin.  He will follow-up with his PCP in 2 days.  He will follow-up with orthopedic surgery if needed within 4 days.  2.  Uncontrolled diabetes: Patient will resume his outpatient medications with an increase in his 32.  He will have follow-up with PCP regarding his diabetes.  Blood sugars were elevated as well due to the infection.  3.  Hypokalemia: This is repleted prior to discharge  4.Tobacco dependence: Patient is encouraged to quit smoking. Counseling was provided for 4 minutes. He did not want nicotine patch   5.  Hyponatremia: This has resolved DISCHARGE CONDITIONS AND DIET:   Stable for discharge on diabetic diet  CONSULTS OBTAINED:    DRUG ALLERGIES:  No Known Allergies  DISCHARGE MEDICATIONS:   Allergies as of 01/27/2018   No Known Allergies     Medication List    STOP taking these medications    acetaminophen 325 MG tablet Commonly known as:  TYLENOL   amoxicillin-clavulanate 875-125 MG tablet Commonly known as:  AUGMENTIN   docusate sodium 100 MG capsule Commonly known as:  COLACE   gabapentin 300 MG capsule Commonly known as:  NEURONTIN   multivitamin with minerals Tabs tablet     TAKE these medications   cephALEXin 500 MG capsule Commonly known as:  KEFLEX Take 1 capsule (500 mg total) by mouth 3 (three) times daily for 10 days.   insulin aspart protamine- aspart (70-30) 100 UNIT/ML injection Commonly known as:  NOVOLOG MIX 70/30 Inject 0.14 mLs (14 Units total) into the skin 2 (two) times daily with a meal. What changed:  how much to take   insulin lispro 100 UNIT/ML injection Commonly known as:  HUMALOG 6 units 3 times a day with meals What changed:  additional instructions   sulfamethoxazole-trimethoprim 800-160 MG tablet Commonly known as:  BACTRIM DS,SEPTRA DS Take 1 tablet by mouth 2 (two) times daily.   traMADol 50 MG tablet Commonly known as:  ULTRAM Take 1 tablet (50 mg total) by mouth every 6 (six) hours as needed.            Discharge Care Instructions  (From admission, onward)         Start     Ordered   01/27/18 0000  Discharge wound care:    Comments:  Pack dressing every other day or if solied   01/27/18 0953  Today   CHIEF COMPLAINT:  Patient wants to go home today reports that the pain and swelling have improved.   VITAL SIGNS:  Blood pressure (!) 141/99, pulse 96, temperature 97.8 F (36.6 C), temperature source Oral, resp. rate 18, height 5\' 8"  (1.727 m), weight 63.5 kg, SpO2 100 %.   REVIEW OF SYSTEMS:  Review of Systems  Constitutional: Negative.  Negative for chills, fever and malaise/fatigue.  HENT: Negative.  Negative for ear discharge, ear pain, hearing loss, nosebleeds and sore throat.   Eyes: Negative.  Negative for blurred vision and pain.  Respiratory: Negative.  Negative for cough,  hemoptysis, shortness of breath and wheezing.   Cardiovascular: Negative.  Negative for chest pain, palpitations and leg swelling.  Gastrointestinal: Negative.  Negative for abdominal pain, blood in stool, diarrhea, nausea and vomiting.  Genitourinary: Negative.  Negative for dysuria.  Musculoskeletal: Negative.  Negative for back pain.  Skin: Negative.        cellulitis  Neurological: Negative for dizziness, tremors, speech change, focal weakness, seizures and headaches.  Endo/Heme/Allergies: Negative.  Does not bruise/bleed easily.  Psychiatric/Behavioral: Negative.  Negative for depression, hallucinations and suicidal ideas.     PHYSICAL EXAMINATION:  GENERAL:  22 y.o.-year-old patient lying in the bed with no acute distress.  NECK:  Supple, no jugular venous distention. No thyroid enlargement, no tenderness.  LUNGS: Normal breath sounds bilaterally, no wheezing, rales,rhonchi  No use of accessory muscles of respiration.  CARDIOVASCULAR: S1, S2 normal. No murmurs, rubs, or gallops.  ABDOMEN: Soft, non-tender, non-distended. Bowel sounds present. No organomegaly or mass.  EXTREMITIES: No pedal edema, cyanosis, or clubbing.  PSYCHIATRIC: The patient is alert and oriented x 3.  SKIN: Right elbow status post I&D is packed no fluctuance noted patient able to move joint without any tenderness erythema has improved  DATA REVIEW:   CBC Recent Labs  Lab 01/27/18 0314  WBC 13.5*  HGB 11.8*  HCT 34.4*  PLT 286    Chemistries  Recent Labs  Lab 01/27/18 0314  NA 135  K 3.2*  CL 101  CO2 27  GLUCOSE 247*  BUN 5*  CREATININE 0.49*  CALCIUM 8.4*    Cardiac Enzymes No results for input(s): TROPONINI in the last 168 hours.  Microbiology Results  @MICRORSLT48 @  RADIOLOGY:  No results found.    Allergies as of 01/27/2018   No Known Allergies     Medication List    STOP taking these medications   acetaminophen 325 MG tablet Commonly known as:  TYLENOL    amoxicillin-clavulanate 875-125 MG tablet Commonly known as:  AUGMENTIN   docusate sodium 100 MG capsule Commonly known as:  COLACE   gabapentin 300 MG capsule Commonly known as:  NEURONTIN   multivitamin with minerals Tabs tablet     TAKE these medications   cephALEXin 500 MG capsule Commonly known as:  KEFLEX Take 1 capsule (500 mg total) by mouth 3 (three) times daily for 10 days.   insulin aspart protamine- aspart (70-30) 100 UNIT/ML injection Commonly known as:  NOVOLOG MIX 70/30 Inject 0.14 mLs (14 Units total) into the skin 2 (two) times daily with a meal. What changed:  how much to take   insulin lispro 100 UNIT/ML injection Commonly known as:  HUMALOG 6 units 3 times a day with meals What changed:  additional instructions   sulfamethoxazole-trimethoprim 800-160 MG tablet Commonly known as:  BACTRIM DS,SEPTRA DS Take 1 tablet by mouth 2 (two) times daily.   traMADol 50 MG  tablet Commonly known as:  ULTRAM Take 1 tablet (50 mg total) by mouth every 6 (six) hours as needed.            Discharge Care Instructions  (From admission, onward)         Start     Ordered   01/27/18 0000  Discharge wound care:    Comments:  Pack dressing every other day or if solied   01/27/18 0953             Management plans discussed with the patient and he is in agreement. Stable for discharge   Patient should follow up with pcp  CODE STATUS:     Code Status Orders  (From admission, onward)         Start     Ordered   01/26/18 1840  Full code  Continuous     01/26/18 1840        Code Status History    Date Active Date Inactive Code Status Order ID Comments User Context   06/16/2017 1141 06/18/2017 1433 Full Code [id number]  Florene Glen, MD ED   03/06/2017 1004 03/08/2017 1913 Full Code [id number]  Harrie Foreman, MD Inpatient   05/21/2016 0804 05/24/2016 1751 Full Code [id number]  Saundra Shelling, MD ED      TOTAL TIME TAKING CARE OF THIS PATIENT:  38 minutes.    Note: This dictation was prepared with Dragon dictation along with smaller phrase technology. Any transcriptional errors that result from this process are unintentional.  Dakiyah Heinke M.D on 01/27/2018 at 10:55 AM  Between 7am to 6pm - Pager - [phone] After 6pm go to [email or web address] - password [email or web address] [facility]  Office  [phone]  CC: Primary care physician; [facility] [place]

## 2018-01-27 NOTE — Progress Notes (Addendum)
Inpatient Diabetes Program Recommendations  AACE/ADA: New Consensus Statement on Inpatient Glycemic Control (2019)  Target Ranges:  Prepandial:   less than 140 mg/dL      Peak postprandial:   less than 180 mg/dL (1-2 hours)      Critically ill patients:  140 - 180 mg/dL  Results for LLIAM, HOH (MRN [id number]) as of 01/27/2018 08:30  Ref. Range 01/26/2018 14:56 01/26/2018 17:44 01/26/2018 18:50 01/26/2018 21:17 01/27/2018 07:36  Glucose-Capillary Latest Ref Range: 70 - 99 mg/dL >600 (HH) 396 (H) 435 (H) 199 (H) 290 (H)    Review of Glycemic Control  Diabetes history: DM1 per chart but likely DM2 since patient reported no insulin in past month and was not acidotic on initial labs Outpatient Diabetes medications: 70/30 10-13 units BID Current orders for Inpatient glycemic control: Lantus 17 units QHS, Novolog 0-15 units TID with meals, Novolog 0-5 units QHS, Novolog 3 units TID with meals  Inpatient Diabetes Program Recommendations:  Insulin - Basal: Noted Lantus was increased from 15 to 17 units QHS today. Insulin - Meal Coverage: Please consider increasing meal coverage to Novolog 6 units TID with meals if patient eats at least 50% of meals. HgbA1C: A1C in process.  NOTE: Per chart review, noted patient does not have insurance and goes to [employer] for health care needs. Will follow up with patient today.  Addendum 01/27/18@11 :30-Spoke with patient about diabetes and home regimen for diabetes control. Patient appeared to be aggravated with talking about DM and gave short yes/no answers to most questions and rolled his eyes at times.  Patient reports that he is followed by [facility] for diabetes management. Patient states that he has not taken his insulin in about 2 weeks because "I am too busy. I have to work and I take care of my grandmother. I don't have time to stop and take the insulin shots."  Patient reports that he has plenty of 70/30 insulin at home and he has  everything to check his glucose.  Discussed 70/30 insulin and explained it needed to be taken consistently BID.  Explained to patient that he was 22 years old and if he does not take care of himself and manage his DM, he may not be able to care for anyone else due to complications for uncontrolled DM.  Discussed glucose and A1C goals.  Explained how hyperglycemia leads to damage within blood vessels which lead to the common complications seen with uncontrolled diabetes. Patient has forearm abscess; discussed wound healing and stressed importance of DM control.  Stressed to the patient the importance of improving glycemic control to prevent further complications from uncontrolled diabetes. Provided emotional support and encouraged patient to monitor glucose, take insulin consistently, and follow up with clinic.  Patient verbalized understanding of information discussed and he states that he has no further questions at this time related to diabetes.  Thanks, Barnie Alderman, RN, MSN, CDE Diabetes Coordinator Inpatient Diabetes Program [phone] (Team Pager from 8am to 5pm)

## 2018-01-27 NOTE — Progress Notes (Signed)
Discharge instructions reviewed with the patient.  All rx including tramadol were given to the patient.  The patient insisted on walking himself out to his car.

## 2018-01-27 NOTE — Care Management Note (Signed)
Case Management Note  Patient Details  Name: Jonathan Mcmillan MRN: [id number] Date of Birth: 05/24/1995   Patient to discharge today. Patient admitted with right arm cellulitis.  Patient is uninsured.  PCP [facility].  Scripts were faxed to [facility]  And confirmed they will be able to provided patient medications at discharge. Per [facility]  Patient has not pick up any insulin from them since February of this year.  Patient confirms he has a working glucometer in the home.   Patient provides RNCM with very limited information.  Patient states "I don't know why yall keep giving more insulin, i've already told them I have plenty.  When asked where the patient obtains his insulin from he states [facility].  This contradicts the information that I received from [facility]  That the patient has not picked up insulin since February.    Patient denies issues with transportation, and asks that RNCM leave so he can take a nap before he discharges.    Subjective/Objective:                    Action/Plan:   Expected Discharge Date:  01/27/18               Expected Discharge Plan:  Home/Self Care  In-House Referral:     Discharge planning Services  Medication Assistance  Post Acute Care Choice:    Choice offered to:     DME Arranged:    DME Agency:     HH Arranged:    HH Agency:     Status of Service:  Completed, signed off  If discussed at [employer] of Stay Meetings, dates discussed:    Additional Comments:  Beverly Sessions, RN 01/27/2018, 2:12 PM

## 2018-01-28 LAB — HEMOGLOBIN A1C
Hgb A1c MFr Bld: 15.1 % — ABNORMAL HIGH (ref 4.8–5.6)
Mean Plasma Glucose: 387 mg/dL

## 2018-01-31 LAB — CULTURE, BLOOD (ROUTINE X 2)
CULTURE: NO GROWTH
Culture: NO GROWTH

## 2018-03-24 ENCOUNTER — Emergency Department: Payer: Self-pay

## 2018-03-24 ENCOUNTER — Encounter: Payer: Self-pay | Admitting: Emergency Medicine

## 2018-03-24 ENCOUNTER — Other Ambulatory Visit: Payer: Self-pay

## 2018-03-24 ENCOUNTER — Emergency Department
Admission: EM | Admit: 2018-03-24 | Discharge: 2018-03-24 | Disposition: A | Discharge status: Home / Self Care | Payer: Self-pay | Attending: Emergency Medicine | Admitting: Emergency Medicine

## 2018-03-24 DIAGNOSIS — R739 Hyperglycemia, unspecified: Secondary | ICD-10-CM

## 2018-03-24 DIAGNOSIS — F1210 Cannabis abuse, uncomplicated: Secondary | ICD-10-CM | POA: Insufficient documentation

## 2018-03-24 DIAGNOSIS — E860 Dehydration: Secondary | ICD-10-CM | POA: Insufficient documentation

## 2018-03-24 DIAGNOSIS — E1065 Type 1 diabetes mellitus with hyperglycemia: Secondary | ICD-10-CM | POA: Insufficient documentation

## 2018-03-24 DIAGNOSIS — F17210 Nicotine dependence, cigarettes, uncomplicated: Secondary | ICD-10-CM | POA: Insufficient documentation

## 2018-03-24 DIAGNOSIS — I10 Essential (primary) hypertension: Secondary | ICD-10-CM | POA: Insufficient documentation

## 2018-03-24 DIAGNOSIS — R112 Nausea with vomiting, unspecified: Secondary | ICD-10-CM | POA: Insufficient documentation

## 2018-03-24 LAB — BLOOD GAS, VENOUS
ACID-BASE EXCESS: 0.9 mmol/L (ref 0.0–2.0)
BICARBONATE: 27.6 mmol/L (ref 20.0–28.0)
O2 SAT: 74.6 %
PATIENT TEMPERATURE: 37
PH VEN: 7.35 (ref 7.250–7.430)
pCO2, Ven: 50 mmHg (ref 44.0–60.0)
pO2, Ven: 42 mmHg (ref 32.0–45.0)

## 2018-03-24 LAB — CK: Total CK: 77 U/L (ref 49–397)

## 2018-03-24 LAB — BETA-HYDROXYBUTYRIC ACID: Beta-Hydroxybutyric Acid: 1.49 mmol/L — ABNORMAL HIGH (ref 0.05–0.27)

## 2018-03-24 LAB — CBC WITH DIFFERENTIAL/PLATELET
ABS IMMATURE GRANULOCYTES: 0 10*3/uL (ref 0.00–0.07)
BASOS ABS: 0 10*3/uL (ref 0.0–0.1)
BASOS PCT: 1 %
EOS PCT: 2 %
Eosinophils Absolute: 0.1 10*3/uL (ref 0.0–0.5)
HCT: 45.3 % (ref 39.0–52.0)
Hemoglobin: 15.4 g/dL (ref 13.0–17.0)
IMMATURE GRANULOCYTES: 0 %
LYMPHS ABS: 1.5 10*3/uL (ref 0.7–4.0)
LYMPHS PCT: 36 %
MCH: 28.1 pg (ref 26.0–34.0)
MCHC: 34 g/dL (ref 30.0–36.0)
MCV: 82.5 fL (ref 80.0–100.0)
Monocytes Absolute: 0.3 10*3/uL (ref 0.1–1.0)
Monocytes Relative: 6 %
Neutro Abs: 2.4 10*3/uL (ref 1.7–7.7)
Neutrophils Relative %: 55 %
PLATELETS: 241 10*3/uL (ref 150–400)
RBC: 5.49 MIL/uL (ref 4.22–5.81)
RDW: 12.2 % (ref 11.5–15.5)
WBC: 4.3 10*3/uL (ref 4.0–10.5)
nRBC: 0 % (ref 0.0–0.2)

## 2018-03-24 LAB — URINALYSIS, COMPLETE (UACMP) WITH MICROSCOPIC
BACTERIA UA: NONE SEEN
Bilirubin Urine: NEGATIVE
Glucose, UA: >=500 mg/dL — AB
Ketones, ur: 20 mg/dL — AB
Leukocytes, UA: NEGATIVE
Nitrite: NEGATIVE
PROTEIN: NEGATIVE mg/dL
SPECIFIC GRAVITY, URINE: 1.029 (ref 1.005–1.030)
pH: 7 (ref 5.0–8.0)

## 2018-03-24 LAB — COMPREHENSIVE METABOLIC PANEL
ALT: 39 U/L (ref 0–44)
AST: 14 U/L — AB (ref 15–41)
Albumin: 4.2 g/dL (ref 3.5–5.0)
Alkaline Phosphatase: 171 U/L — ABNORMAL HIGH (ref 38–126)
Anion gap: 13 (ref 5–15)
BUN: 8 mg/dL (ref 6–20)
CHLORIDE: 90 mmol/L — AB (ref 98–111)
CO2: 25 mmol/L (ref 22–32)
CREATININE: 0.7 mg/dL (ref 0.61–1.24)
Calcium: 9 mg/dL (ref 8.9–10.3)
GFR calc Af Amer: >60 mL/min (ref >60)
Glucose, Bld: 494 mg/dL — ABNORMAL HIGH (ref 70–99)
Potassium: 4.1 mmol/L (ref 3.5–5.1)
Sodium: 128 mmol/L — ABNORMAL LOW (ref 135–145)
Total Bilirubin: 1.5 mg/dL — ABNORMAL HIGH (ref 0.3–1.2)
Total Protein: 8 g/dL (ref 6.5–8.1)

## 2018-03-24 LAB — GLUCOSE, CAPILLARY
Glucose-Capillary: 491 mg/dL — ABNORMAL HIGH (ref 70–99)
Glucose-Capillary: 323 mg/dL — ABNORMAL HIGH (ref 70–99)

## 2018-03-24 MED ORDER — INSULIN ASPART PROT & ASPART (70-30 MIX) 100 UNIT/ML ~~LOC~~ SUSP
15.0000 [IU] | Freq: Once | SUBCUTANEOUS | Status: AC
  Administered 2018-03-24: 15 [IU] via SUBCUTANEOUS
  Filled 2018-03-24: qty 10

## 2018-03-24 MED ORDER — INSULIN ASPART PROT & ASPART (70-30 MIX) 100 UNIT/ML ~~LOC~~ SUSP
SUBCUTANEOUS | Status: AC
  Administered 2018-03-24: 15 [IU] via SUBCUTANEOUS
  Filled 2018-03-24: qty 10

## 2018-03-24 MED ORDER — ONDANSETRON HCL 4 MG/2ML IJ SOLN
4.0000 mg | Freq: Once | INTRAMUSCULAR | Status: AC
  Administered 2018-03-24: 4 mg via INTRAVENOUS
  Filled 2018-03-24: qty 2

## 2018-03-24 MED ORDER — SODIUM CHLORIDE 0.9 % IV SOLN
Freq: Once | INTRAVENOUS | Status: AC
  Administered 2018-03-24: 12:00:00 via INTRAVENOUS

## 2018-03-24 MED ORDER — KETOROLAC TROMETHAMINE 30 MG/ML IJ SOLN
30.0000 mg | Freq: Once | INTRAMUSCULAR | Status: AC
  Administered 2018-03-24: 30 mg via INTRAVENOUS
  Filled 2018-03-24: qty 1

## 2018-03-24 MED ORDER — SODIUM CHLORIDE 0.9 % IV SOLN
Freq: Once | INTRAVENOUS | Status: AC
  Administered 2018-03-24: 09:00:00 via INTRAVENOUS

## 2018-03-24 MED ORDER — ONDANSETRON 4 MG PO TBDP: 4.0000 mg | ORAL_TABLET | Freq: Three times a day (TID) | ORAL | 0 refills | Status: DC | PRN

## 2018-03-24 MED ORDER — ONDANSETRON HCL 4 MG/2ML IJ SOLN: 4.0000 mg | Freq: Once | INTRAMUSCULAR | Status: DC

## 2018-03-24 MED ORDER — INSULIN ASPART 100 UNIT/ML ~~LOC~~ SOLN
10.0000 [IU] | Freq: Once | SUBCUTANEOUS | Status: AC
  Administered 2018-03-24: 10 [IU] via INTRAVENOUS
  Filled 2018-03-24: qty 1

## 2018-03-24 NOTE — ED Provider Notes (Signed)
[facility] Emergency Department Provider Note       Time seen: ----------------------------------------- 8:18 AM on 03/24/2018 -----------------------------------------   I have reviewed the triage vital signs and the nursing notes.  HISTORY   Chief Complaint Fatigue and Emesis    HPI Jonathan Mcmillan is a 22 y.o. male with a history of type 1 diabetes, hypertension, obesity, sepsis who presents to the ED for extreme fatigue and muscle pain for the past 2 days.  Patient reports he works 2 jobs, slept all day yesterday but has not had a fever.  He is been vomiting since yesterday and still nauseated but feels like nothing comes up.  Blood sugars have been higher than normal.  Past Medical History:  Diagnosis Date  . Diabetes 1.5, managed as type 1 ([place])   . Perirectal abscess 06/16/2017    Patient Active Problem List   Diagnosis Date Noted  . Right arm cellulitis 01/26/2018  . Sepsis ([place]) 03/06/2017  . Malnutrition of moderate degree 05/22/2016  . DKA (diabetic ketoacidoses) ([place]) 05/21/2016  . Type I (juvenile type) diabetes mellitus without mention of complication, uncontrolled 10/05/2010  . Hypertension 10/05/2010  . Obesity 10/05/2010    Past Surgical History:  Procedure Laterality Date  . INCISION AND DRAINAGE PERIRECTAL ABSCESS N/A 06/16/2017   Procedure: IRRIGATION AND DEBRIDEMENT PERIRECTAL ABSCESS;  Surgeon: Florene Glen, MD;  Location: [facility] ORS;  Service: General;  Laterality: N/A;  . none      Allergies Patient has no known allergies.  Social History Social History   Tobacco Use  . Smoking status: Current Every Day Smoker    Packs/day: 0.50    Types: Cigarettes  . Smokeless tobacco: Never Used  Substance Use Topics  . Alcohol use: No  . Drug use: Yes    Types: Marijuana   Review of Systems Constitutional: Negative for fever. Cardiovascular: Negative for chest pain. Respiratory: Negative for shortness of  breath. Gastrointestinal: Negative for abdominal pain, positive for vomiting Musculoskeletal: Negative for back pain.  Positive for muscle pain Skin: Negative for rash. Neurological: Positive for weakness  All systems negative/normal/unremarkable except as stated in the HPI  ____________________________________________   PHYSICAL EXAM:  VITAL SIGNS: ED Triage Vitals  Enc Vitals Group     BP 03/24/18 0807 123/87     Pulse Rate 03/24/18 0806 (!) 108     Resp 03/24/18 0806 16     Temp 03/24/18 0806 97.7 F (36.5 C)     Temp Source 03/24/18 0806 Oral     SpO2 03/24/18 0806 98 %     Weight 03/24/18 0806 140 lb (63.5 kg)     Height 03/24/18 0806 5\' 9"  (1.753 m)     Head Circumference --      Peak Flow --      Pain Score 03/24/18 0806 10     Pain Loc --      Pain Edu? --      Excl. in [place]? --    Constitutional: Alert and oriented. Well appearing and in no distress. Eyes: Conjunctivae are normal. Normal extraocular movements. Cardiovascular: Normal rate, regular rhythm. No murmurs, rubs, or gallops. Respiratory: Normal respiratory effort without tachypnea nor retractions. Breath sounds are clear and equal bilaterally. No wheezes/rales/rhonchi. Gastrointestinal: Soft and nontender. Normal bowel sounds Musculoskeletal: Nontender with normal range of motion in extremities. No lower extremity tenderness nor edema. Neurologic:  Normal speech and language. No gross focal neurologic deficits are appreciated.  Skin:  Skin is warm,  dry and intact. No rash noted. Psychiatric: Mood and affect are normal. Speech and behavior are normal.  ____________________________________________  ED COURSE:  As part of my medical decision making, I reviewed the following data within the [place] History obtained from family if available, nursing notes, old chart and ekg, as well as notes from prior ED visits. Patient presented for muscle aches and hyperglycemia, we will assess with labs  and imaging as indicated at this time. Clinical Course as of Mar 24 1130  Mon Mar 24, 2018  1130 Patient reports he is feeling much better, we have given him a shot of his 70/30 insulin.  He was perhaps in mild early DKA but otherwise appears well enough for outpatient follow-up.  I did offer admission to him but he has declined.   [JW]    Clinical Course User Index [JW] Earleen Newport, MD   Procedures ____________________________________________   LABS (pertinent positives/negatives)  Labs Reviewed  GLUCOSE, CAPILLARY - Abnormal; Notable for the following components:      Result Value   Glucose-Capillary 491 (*)    All other components within normal limits  COMPREHENSIVE METABOLIC PANEL - Abnormal; Notable for the following components:   Sodium 128 (*)    Chloride 90 (*)    Glucose, Bld 494 (*)    AST 14 (*)    Alkaline Phosphatase 171 (*)    Total Bilirubin 1.5 (*)    All other components within normal limits  BETA-HYDROXYBUTYRIC ACID - Abnormal; Notable for the following components:   Beta-Hydroxybutyric Acid 1.49 (*)    All other components within normal limits  URINALYSIS, COMPLETE (UACMP) WITH MICROSCOPIC - Abnormal; Notable for the following components:   Color, Urine COLORLESS (*)    APPearance CLEAR (*)    Glucose, UA >=500 (*)    Hgb urine dipstick SMALL (*)    Ketones, ur 20 (*)    All other components within normal limits  GLUCOSE, CAPILLARY - Abnormal; Notable for the following components:   Glucose-Capillary 323 (*)    All other components within normal limits  CBC WITH DIFFERENTIAL/PLATELET  BLOOD GAS, VENOUS  CK  CBG MONITORING, ED    RADIOLOGY Images were viewed by me  Chest x-ray IMPRESSION: Increased density projecting in the right upper lobe which may reflect overlap of normal bony structures but an infiltrate here is not excluded. Elsewhere the lungs are clear. No CHF. IMPRESSION: No definite persistent abnormality identified within  the UPPER RIGHT lung ____________________________________________  DIFFERENTIAL DIAGNOSIS   Dehydration, electrolyte abnormality, DKA, sepsis  FINAL ASSESSMENT AND PLAN  Hyperglycemia, mild DKA   Plan: The patient had presented for possible DKA. Patient's labs again likely indicated mild early DKA. Patient's imaging was negative.  I did offer admission but he has declined.  He is currently feeling better after fluids and insulin.  He is in no acute distress and likely will do fine as an outpatient.   Laurence Aly, MD   Note: This note was generated in part or whole with voice recognition software. Voice recognition is usually quite accurate but there are transcription errors that can and very often do occur. I apologize for any typographical errors that were not detected and corrected.     Earleen Newport, MD 03/24/18 [phone]

## 2018-03-24 NOTE — ED Triage Notes (Signed)
Has had extreme fatigue and muscle pain X 2 days. Works 2 jobs.  Slept all day yesterday. No fevers. Has been vomiting since yesterday and still nauseated but feels like nothing can come up. Type 1 DM.  Appears to feel bad.  Blood sugars have been higher than normal.

## 2018-03-24 NOTE — ED Notes (Signed)
Per pt he has not taken any insulin today.

## 2018-03-24 NOTE — Progress Notes (Signed)
Inpatient Diabetes Program Recommendations  AACE/ADA: New Consensus Statement on Inpatient Glycemic Control (2019)  Target Ranges:  Prepandial:   less than 140 mg/dL      Peak postprandial:   less than 180 mg/dL (1-2 hours)      Critically ill patients:  140 - 180 mg/dL  Results for Jonathan Mcmillan, Jonathan Mcmillan (MRN [id number]) as of 03/24/2018 09:58  Ref. Range 03/24/2018 08:30  Glucose Latest Ref Range: 70 - 99 mg/dL 494 (H)   Results for Jonathan Mcmillan, Jonathan Mcmillan (MRN [id number]) as of 03/24/2018 09:58  Ref. Range 01/27/2018 03:14  Hemoglobin A1C Latest Ref Range: 4.8 - 5.6 % 15.1 (H)   Review of Glycemic Control  Outpatient Diabetes medications: 70/30 14 units BID, Humalog 6 units TID with meals Current orders for Inpatient glycemic control: Novolog 10 units IV x 1 (ordered at 9:25am); in ED at this time   Inpatient Diabetes Program Recommendations:  Insulin - Basal: If patient is admitted, please consider ordering Lantus 17 units Q24H. Correction (SSI): If patient is admitted, please consider ordering Novolog 0-9 units TID with meals and Novolog 0-5 units QHS. Insulin - Meal Coverage: If patient is admitted, please consider ordering Novolog 5 units TID with meals for meal coverage if patient eats at least 50% of meals. HgbA1C: A1C 15.1% on 01/27/18 indicating an average glucose of 387 mg/dl. Inpatient Diabetes Coordinator spoke with patient on 01/27/18 during last hospital admission.   NOTE: Initial lab glucose 494 mg/dl today but not acidotic. Noted order for Novolog 10 units IV x1. Patient was recently inpatient 01/26/18 to 01/27/18 and was seen by Inpatient Diabetes Coordinator on 01/27/18. Patient reported that he [facility] and gets medications from [facility]. Will follow if admitted.  Thanks, Barnie Alderman, RN, MSN, CDE Diabetes Coordinator Inpatient Diabetes Program [phone] (Team Pager from 8am to 5pm)

## 2018-05-08 ENCOUNTER — Other Ambulatory Visit: Payer: Self-pay

## 2018-05-08 ENCOUNTER — Encounter: Payer: Self-pay | Admitting: Emergency Medicine

## 2018-05-08 ENCOUNTER — Emergency Department
Admission: EM | Admit: 2018-05-08 | Discharge: 2018-05-08 | Disposition: A | Discharge status: Home / Self Care | Payer: Self-pay | Attending: Emergency Medicine | Admitting: Emergency Medicine

## 2018-05-08 DIAGNOSIS — Z79899 Other long term (current) drug therapy: Secondary | ICD-10-CM | POA: Insufficient documentation

## 2018-05-08 DIAGNOSIS — K047 Periapical abscess without sinus: Secondary | ICD-10-CM | POA: Insufficient documentation

## 2018-05-08 DIAGNOSIS — F17210 Nicotine dependence, cigarettes, uncomplicated: Secondary | ICD-10-CM | POA: Insufficient documentation

## 2018-05-08 DIAGNOSIS — E119 Type 2 diabetes mellitus without complications: Secondary | ICD-10-CM | POA: Insufficient documentation

## 2018-05-08 MED ORDER — AMOXICILLIN 500 MG PO CAPS
1000.0000 mg | ORAL_CAPSULE | Freq: Once | ORAL | Status: AC
  Administered 2018-05-08: 1000 mg via ORAL
  Filled 2018-05-08: qty 2

## 2018-05-08 MED ORDER — AMOXICILLIN 875 MG PO TABS: 875.0000 mg | ORAL_TABLET | Freq: Two times a day (BID) | ORAL | 0 refills | Status: DC

## 2018-05-08 MED ORDER — MAGIC MOUTHWASH W/LIDOCAINE: 5.0000 mL | Freq: Four times a day (QID) | ORAL | 0 refills | Status: DC

## 2018-05-08 NOTE — ED Notes (Signed)
Pt with multiple dental caries. Swelling, pain top left front.

## 2018-05-08 NOTE — ED Provider Notes (Signed)
[facility] Emergency Department Provider Note  ____________________________________________  Time seen: Approximately 5:03 PM  I have reviewed the triage vital signs and the nursing notes.   HISTORY  Chief Complaint Dental Pain    HPI Jonathan Mcmillan is a 22 y.o. male who presents emergency department complaining of right upper dental pain and swelling.  Patient reports that he has "bad teeth" and he believes that 2 areas are infected.  No difficulty breathing or swallowing.  No fevers or chills.  Patient denies any headache, neck pain or stiffness, chest pain, shortness of breath abdominal pain, nausea or vomiting.  No medications for his complaint prior to arrival.  Patient does not have a dentist.    Past Medical History:  Diagnosis Date  . Diabetes 1.5, managed as type 1 ([place])   . Perirectal abscess 06/16/2017    Patient Active Problem List   Diagnosis Date Noted  . Right arm cellulitis 01/26/2018  . Sepsis ([place]) 03/06/2017  . Malnutrition of moderate degree 05/22/2016  . DKA (diabetic ketoacidoses) ([place]) 05/21/2016  . Type I (juvenile type) diabetes mellitus without mention of complication, uncontrolled 10/05/2010  . Hypertension 10/05/2010  . Obesity 10/05/2010    Past Surgical History:  Procedure Laterality Date  . INCISION AND DRAINAGE PERIRECTAL ABSCESS N/A 06/16/2017   Procedure: IRRIGATION AND DEBRIDEMENT PERIRECTAL ABSCESS;  Surgeon: Florene Glen, MD;  Location: [facility] ORS;  Service: General;  Laterality: N/A;  . none      Prior to Admission medications   Medication Sig Start Date End Date Taking? Authorizing Provider  amoxicillin (AMOXIL) 875 MG tablet Take 1 tablet (875 mg total) by mouth 2 (two) times daily. 05/08/18   , Charline Bills, PA-C  insulin aspart protamine- aspart (NOVOLOG MIX 70/30) (70-30) 100 UNIT/ML injection Inject 0.14 mLs (14 Units total) into the skin 2 (two) times daily with a meal. 01/27/18   Bettey Costa, MD   insulin lispro (HUMALOG) 100 UNIT/ML injection 6 units 3 times a day with meals 01/27/18 01/27/19  Bettey Costa, MD  magic mouthwash w/lidocaine SOLN Take 5 mLs by mouth 4 (four) times daily. 05/08/18   , Charline Bills, PA-C  ondansetron (ZOFRAN ODT) 4 MG disintegrating tablet Take 1 tablet (4 mg total) by mouth every 8 (eight) hours as needed for nausea or vomiting. 03/24/18   Earleen Newport, MD  sulfamethoxazole-trimethoprim (BACTRIM DS,SEPTRA DS) 800-160 MG tablet Take 1 tablet by mouth 2 (two) times daily. 01/27/18   Bettey Costa, MD  traMADol (ULTRAM) 50 MG tablet Take 1 tablet (50 mg total) by mouth every 6 (six) hours as needed. 01/27/18 01/27/19  Bettey Costa, MD    Allergies Patient has no known allergies.  Family History  Problem Relation Age of Onset  . Diabetes Mother     Social History Social History   Tobacco Use  . Smoking status: Current Every Day Smoker    Packs/day: 0.50    Types: Cigarettes  . Smokeless tobacco: Never Used  Substance Use Topics  . Alcohol use: No  . Drug use: Yes    Types: Marijuana     Review of Systems  Constitutional: No fever/chills Eyes: No visual changes. No discharge ENT: Positive for right upper dental pain/swelling/possible infection Cardiovascular: no chest pain. Respiratory: no cough. No SOB. Gastrointestinal: No abdominal pain.  No nausea, no vomiting.  No diarrhea.  No constipation. Musculoskeletal: Negative for musculoskeletal pain. Skin: Negative for rash, abrasions, lacerations, ecchymosis. Neurological: Negative for headaches, focal weakness or  numbness. 10-point ROS otherwise negative.  ____________________________________________   PHYSICAL EXAM:  VITAL SIGNS: ED Triage Vitals [05/08/18 1628]  Enc Vitals Group     BP (!) 141/92     Pulse Rate (!) 128     Resp 16     Temp 98.6 F (37 C)     Temp Source Oral     SpO2 99 %     Weight      Height      Head Circumference      Peak Flow      Pain Score  10     Pain Loc      Pain Edu?      Excl. in [place]?      Constitutional: Alert and oriented. Well appearing and in no acute distress. Eyes: Conjunctivae are normal. PERRL. EOMI. Head: Atraumatic. ENT:      Ears:       Nose: No congestion/rhinnorhea.      Mouth/Throat: Mucous membranes are moist.  Poor dentition throughout.  Multiple dental caries, erosions.  Of note, significant dental erosions into the front incisor and canine.  Erythema and edema noted to the gum.  No appreciable abscess.  No other obvious signs of infection.  Uvula is midline.  No indication of deep space infection. Neck: No stridor.  Neck is supple full range of motion Hematological/Lymphatic/Immunilogical: No cervical lymphadenopathy. Cardiovascular: Normal rate, regular rhythm. Normal S1 and S2.  Good peripheral circulation. Respiratory: Normal respiratory effort without tachypnea or retractions. Lungs CTAB. Good air entry to the bases with no decreased or absent breath sounds. Musculoskeletal: Full range of motion to all extremities. No gross deformities appreciated. Neurologic:  Normal speech and language. No gross focal neurologic deficits are appreciated.  Skin:  Skin is warm, dry and intact. No rash noted. Psychiatric: Mood and affect are normal. Speech and behavior are normal. Patient exhibits appropriate insight and judgement.   ____________________________________________   LABS (all labs ordered are listed, but only abnormal results are displayed)  Labs Reviewed - No data to display ____________________________________________  EKG   ____________________________________________  RADIOLOGY   No results found.  ____________________________________________    PROCEDURES  Procedure(s) performed:    Procedures    Medications  amoxicillin (AMOXIL) capsule 1,000 mg (has no administration in time range)     ____________________________________________   INITIAL IMPRESSION /  ASSESSMENT AND PLAN / ED COURSE  Pertinent labs & imaging results that were available during my care of the patient were reviewed by me and considered in my medical decision making (see chart for details).  Review of the  [place]RS was performed in accordance of the [place] prior to dispensing any controlled drugs.      Patient's diagnosis is consistent with dental abscess, dental caries, dental erosions.  Patient presents emergency department for pain and swelling to the right upper dentition.  Signs of infection are appreciated.  Differential included dental trauma, pain from dental caries, dental abscess.. Patient will be discharged home with prescriptions for amoxicillin, Magic mouthwash for symptom relief. Patient is to follow up with dentist as needed or otherwise directed. Patient is given ED precautions to return to the ED for any worsening or new symptoms.     ____________________________________________  FINAL CLINICAL IMPRESSION(S) / ED DIAGNOSES  Final diagnoses:  Dental abscess      NEW MEDICATIONS STARTED DURING THIS VISIT:  ED Discharge Orders         Ordered    amoxicillin (AMOXIL) 875 MG tablet  2 times daily     05/08/18 1711    magic mouthwash w/lidocaine SOLN  4 times daily    Note to Pharmacy:  Dispense in a 1/1/1 ratio. Use lidocaine, diphenhydramine, prednisolone   05/08/18 1711              This chart was dictated using voice recognition software/Dragon. Despite best efforts to proofread, errors can occur which can change the meaning. Any change was purely unintentional.    Darletta Moll, PA-C 05/08/18 1711    Carrie Mew, MD 05/19/18 [phone]

## 2018-05-08 NOTE — ED Triage Notes (Signed)
PT c/o upper dental pain x2 days. Denies fever, pt appears uncomfortable, swelling noted.

## 2018-05-24 ENCOUNTER — Other Ambulatory Visit: Payer: Self-pay

## 2018-05-24 ENCOUNTER — Emergency Department: Payer: Self-pay

## 2018-05-24 ENCOUNTER — Emergency Department
Admission: EM | Admit: 2018-05-24 | Discharge: 2018-05-24 | Disposition: A | Discharge status: Home / Self Care | Payer: Self-pay | Attending: Emergency Medicine | Admitting: Emergency Medicine

## 2018-05-24 ENCOUNTER — Encounter: Payer: Self-pay | Admitting: Emergency Medicine

## 2018-05-24 DIAGNOSIS — I10 Essential (primary) hypertension: Secondary | ICD-10-CM | POA: Insufficient documentation

## 2018-05-24 DIAGNOSIS — Z794 Long term (current) use of insulin: Secondary | ICD-10-CM | POA: Insufficient documentation

## 2018-05-24 DIAGNOSIS — R1010 Upper abdominal pain, unspecified: Secondary | ICD-10-CM | POA: Insufficient documentation

## 2018-05-24 DIAGNOSIS — F17210 Nicotine dependence, cigarettes, uncomplicated: Secondary | ICD-10-CM | POA: Insufficient documentation

## 2018-05-24 DIAGNOSIS — R197 Diarrhea, unspecified: Secondary | ICD-10-CM | POA: Insufficient documentation

## 2018-05-24 DIAGNOSIS — E109 Type 1 diabetes mellitus without complications: Secondary | ICD-10-CM | POA: Insufficient documentation

## 2018-05-24 DIAGNOSIS — Z79899 Other long term (current) drug therapy: Secondary | ICD-10-CM | POA: Insufficient documentation

## 2018-05-24 LAB — URINALYSIS, COMPLETE (UACMP) WITH MICROSCOPIC
BILIRUBIN URINE: NEGATIVE
Bacteria, UA: NONE SEEN
Glucose, UA: >=500 mg/dL — AB
Ketones, ur: 5 mg/dL — AB
Leukocytes, UA: NEGATIVE
NITRITE: NEGATIVE
Protein, ur: 30 mg/dL — AB
SPECIFIC GRAVITY, URINE: 1.03 (ref 1.005–1.030)
pH: 7 (ref 5.0–8.0)

## 2018-05-24 LAB — LIPASE, BLOOD: LIPASE: 25 U/L (ref 11–51)

## 2018-05-24 LAB — CBC WITH DIFFERENTIAL/PLATELET
ABS IMMATURE GRANULOCYTES: 0.02 10*3/uL (ref 0.00–0.07)
BASOS PCT: 0 %
Basophils Absolute: 0 10*3/uL (ref 0.0–0.1)
EOS ABS: 0.1 10*3/uL (ref 0.0–0.5)
EOS PCT: 1 %
HCT: 43.4 % (ref 39.0–52.0)
Hemoglobin: 14.6 g/dL (ref 13.0–17.0)
Immature Granulocytes: 0 %
Lymphocytes Relative: 31 %
Lymphs Abs: 1.7 10*3/uL (ref 0.7–4.0)
MCH: 28.7 pg (ref 26.0–34.0)
MCHC: 33.6 g/dL (ref 30.0–36.0)
MCV: 85.4 fL (ref 80.0–100.0)
MONO ABS: 0.4 10*3/uL (ref 0.1–1.0)
Monocytes Relative: 7 %
NEUTROS ABS: 3.3 10*3/uL (ref 1.7–7.7)
Neutrophils Relative %: 61 %
PLATELETS: 290 10*3/uL (ref 150–400)
RBC: 5.08 MIL/uL (ref 4.22–5.81)
RDW: 12.1 % (ref 11.5–15.5)
WBC: 5.5 10*3/uL (ref 4.0–10.5)
nRBC: 0 % (ref 0.0–0.2)

## 2018-05-24 LAB — COMPREHENSIVE METABOLIC PANEL
ALBUMIN: 4.1 g/dL (ref 3.5–5.0)
ALT: 22 U/L (ref 0–44)
AST: 11 U/L — AB (ref 15–41)
Alkaline Phosphatase: 141 U/L — ABNORMAL HIGH (ref 38–126)
Anion gap: 13 (ref 5–15)
BUN: 7 mg/dL (ref 6–20)
CHLORIDE: 95 mmol/L — AB (ref 98–111)
CO2: 25 mmol/L (ref 22–32)
CREATININE: 0.75 mg/dL (ref 0.61–1.24)
Calcium: 9.3 mg/dL (ref 8.9–10.3)
GFR calc non Af Amer: >60 mL/min (ref >60)
Glucose, Bld: 477 mg/dL — ABNORMAL HIGH (ref 70–99)
Potassium: 4.1 mmol/L (ref 3.5–5.1)
SODIUM: 133 mmol/L — AB (ref 135–145)
Total Bilirubin: 2 mg/dL — ABNORMAL HIGH (ref 0.3–1.2)
Total Protein: 8 g/dL (ref 6.5–8.1)

## 2018-05-24 LAB — URINE DRUG SCREEN, QUALITATIVE (ARMC ONLY)
Amphetamines, Ur Screen: NOT DETECTED
Barbiturates, Ur Screen: NOT DETECTED
Benzodiazepine, Ur Scrn: NOT DETECTED
Cannabinoid 50 Ng, Ur ~~LOC~~: POSITIVE — AB
Cocaine Metabolite,Ur ~~LOC~~: NOT DETECTED
MDMA (Ecstasy)Ur Screen: NOT DETECTED
Methadone Scn, Ur: NOT DETECTED
Opiate, Ur Screen: NOT DETECTED
Phencyclidine (PCP) Ur S: NOT DETECTED
TRICYCLIC, UR SCREEN: NOT DETECTED

## 2018-05-24 LAB — GLUCOSE, CAPILLARY
Glucose-Capillary: 401 mg/dL — ABNORMAL HIGH (ref 70–99)
Glucose-Capillary: 370 mg/dL — ABNORMAL HIGH (ref 70–99)

## 2018-05-24 LAB — TROPONIN I: Troponin I: <0.03 ng/mL (ref <0.03)

## 2018-05-24 LAB — INFLUENZA PANEL BY PCR (TYPE A & B)
INFLAPCR: NEGATIVE
Influenza B By PCR: NEGATIVE

## 2018-05-24 MED ORDER — SODIUM CHLORIDE 0.9 % IV BOLUS
1000.0000 mL | Freq: Once | INTRAVENOUS | Status: AC
  Administered 2018-05-24: 1000 mL via INTRAVENOUS

## 2018-05-24 MED ORDER — NITROGLYCERIN 2 % TD OINT: 1.0000 [in_us] | TOPICAL_OINTMENT | Freq: Once | TRANSDERMAL | Status: DC

## 2018-05-24 MED ORDER — AMLODIPINE BESYLATE 5 MG PO TABS
5.0000 mg | ORAL_TABLET | Freq: Once | ORAL | Status: AC
  Administered 2018-05-24: 5 mg via ORAL
  Filled 2018-05-24: qty 1

## 2018-05-24 MED ORDER — AMLODIPINE BESYLATE 5 MG PO TABS: 5.0000 mg | ORAL_TABLET | Freq: Every day | ORAL | 11 refills | Status: DC

## 2018-05-24 NOTE — Discharge Instructions (Signed)
The Norvasc 1 pill a day.  Follow-up with your primary care doctor in the next few days to see how your blood pressure is doing.  Return here for any fever continued diarrhea lightheadedness or any other problems.

## 2018-05-24 NOTE — ED Provider Notes (Signed)
[facility] Emergency Department Provider Note   ____________________________________________   First MD Initiated Contact with Patient 05/24/18 1100     (approximate)  I have reviewed the triage vital signs and the nursing notes.   HISTORY  Chief Complaint Abdominal Pain and Diarrhea   HPI Jonathan Mcmillan is a 23 y.o. male who complains of 3 days of nausea vomiting and diagonal pain with diarrhea.  Abdominal pain is crampy except for in the epigastric area where he is tender to palpation.  It is moderate he says he is feeling hot and cold but not running a fever.  She is not coughing but he is aching all over.  He has not had any nausea or vomiting this morning.  He asked for some apple juice to drink because he is very thirsty.  He has type 1 diabetes as well.   Past Medical History:  Diagnosis Date  . Diabetes 1.5, managed as type 1 ([place])   . Perirectal abscess 06/16/2017    Patient Active Problem List   Diagnosis Date Noted  . Right arm cellulitis 01/26/2018  . Sepsis ([place]) 03/06/2017  . Malnutrition of moderate degree 05/22/2016  . DKA (diabetic ketoacidoses) ([place]) 05/21/2016  . Type I (juvenile type) diabetes mellitus without mention of complication, uncontrolled 10/05/2010  . Hypertension 10/05/2010  . Obesity 10/05/2010    Past Surgical History:  Procedure Laterality Date  . INCISION AND DRAINAGE PERIRECTAL ABSCESS N/A 06/16/2017   Procedure: IRRIGATION AND DEBRIDEMENT PERIRECTAL ABSCESS;  Surgeon: Florene Glen, MD;  Location: [facility] ORS;  Service: General;  Laterality: N/A;  . none      Prior to Admission medications   Medication Sig Start Date End Date Taking? Authorizing Provider  amLODipine (NORVASC) 5 MG tablet Take 1 tablet (5 mg total) by mouth daily. 05/24/18 05/24/19  Nena Polio, MD  amoxicillin (AMOXIL) 875 MG tablet Take 1 tablet (875 mg total) by mouth 2 (two) times daily. 05/08/18   Cuthriell, Charline Bills, PA-C  insulin  aspart protamine- aspart (NOVOLOG MIX 70/30) (70-30) 100 UNIT/ML injection Inject 0.14 mLs (14 Units total) into the skin 2 (two) times daily with a meal. 01/27/18   Bettey Costa, MD  insulin lispro (HUMALOG) 100 UNIT/ML injection 6 units 3 times a day with meals 01/27/18 01/27/19  Bettey Costa, MD  magic mouthwash w/lidocaine SOLN Take 5 mLs by mouth 4 (four) times daily. 05/08/18   Cuthriell, Charline Bills, PA-C  ondansetron (ZOFRAN ODT) 4 MG disintegrating tablet Take 1 tablet (4 mg total) by mouth every 8 (eight) hours as needed for nausea or vomiting. 03/24/18   Earleen Newport, MD  sulfamethoxazole-trimethoprim (BACTRIM DS,SEPTRA DS) 800-160 MG tablet Take 1 tablet by mouth 2 (two) times daily. 01/27/18   Bettey Costa, MD  traMADol (ULTRAM) 50 MG tablet Take 1 tablet (50 mg total) by mouth every 6 (six) hours as needed. 01/27/18 01/27/19  Bettey Costa, MD    Allergies Patient has no known allergies.  Family History  Problem Relation Age of Onset  . Diabetes Mother     Social History Social History   Tobacco Use  . Smoking status: Current Every Day Smoker    Packs/day: 0.50    Types: Cigarettes  . Smokeless tobacco: Never Used  Substance Use Topics  . Alcohol use: No  . Drug use: Yes    Types: Marijuana    Review of Systems  Constitutional: See HPI Eyes: No visual changes. ENT: No sore throat.  Cardiovascular: Denies chest pain. Respiratory: Denies shortness of breath. Gastrointestinal: See HPI Genitourinary: Negative for dysuria. Musculoskeletal: Negative for back pain. Skin: Negative for rash. Neurological: Negative for headaches, focal weakness   ____________________________________________   PHYSICAL EXAM:  VITAL SIGNS: ED Triage Vitals  Enc Vitals Group     BP 05/24/18 1052 (!) 152/100     Pulse Rate 05/24/18 1052 (!) 113     Resp 05/24/18 1052 18     Temp 05/24/18 1052 98.7 F (37.1 C)     Temp Source 05/24/18 1052 Oral     SpO2 05/24/18 1052 100 %      Weight 05/24/18 1053 140 lb (63.5 kg)     Height 05/24/18 1053 5\' 9"  (1.753 m)     Head Circumference --      Peak Flow --      Pain Score 05/24/18 1052 7     Pain Loc --      Pain Edu? --      Excl. in [place]? --    Constitutional: Alert and oriented. Well appearing and in no acute distress. Eyes: Conjunctivae are normal.  Head: Atraumatic. Nose: No congestion/rhinnorhea. Mouth/Throat: Mucous membranes are moist.  Oropharynx non-erythematous. Neck: No stridor.   Cardiovascular: Normal rate, regular rhythm. Grossly normal heart sounds.  Good peripheral circulation. Respiratory: Normal respiratory effort.  No retractions. Lungs CTAB. Gastrointestinal: Soft and nontender except for mildly in the epigastric area to palpation.. No distention. No abdominal bruits.  There is some tenderness in the right CVA area.  None on the left.  CVA tenderness. Musculoskeletal: No lower extremity tenderness nor edema.  No joint effusions. Neurologic:  Normal speech and language. No gross focal neurologic deficits are appreciated. .  Skin:  Skin is warm, dry and intact. No rash noted. Psychiatric: Mood and affect are normal. Speech and behavior are normal.  ____________________________________________   LABS (all labs ordered are listed, but only abnormal results are displayed)  Labs Reviewed  COMPREHENSIVE METABOLIC PANEL - Abnormal; Notable for the following components:      Result Value   Sodium 133 (*)    Chloride 95 (*)    Glucose, Bld 477 (*)    AST 11 (*)    Alkaline Phosphatase 141 (*)    Total Bilirubin 2.0 (*)    All other components within normal limits  URINALYSIS, COMPLETE (UACMP) WITH MICROSCOPIC - Abnormal; Notable for the following components:   Color, Urine STRAW (*)    APPearance CLEAR (*)    Glucose, UA >=500 (*)    Hgb urine dipstick MODERATE (*)    Ketones, ur 5 (*)    Protein, ur 30 (*)    All other components within normal limits  GLUCOSE, CAPILLARY - Abnormal; Notable  for the following components:   Glucose-Capillary 401 (*)    All other components within normal limits  GLUCOSE, CAPILLARY - Abnormal; Notable for the following components:   Glucose-Capillary 370 (*)    All other components within normal limits  URINE DRUG SCREEN, QUALITATIVE ([facility] ONLY) - Abnormal; Notable for the following components:   Cannabinoid 50 Ng, Ur [place] POSITIVE (*)    All other components within normal limits  GASTROINTESTINAL PANEL BY PCR, STOOL (REPLACES STOOL CULTURE)  C DIFFICILE QUICK SCREEN W PCR REFLEX  LIPASE, BLOOD  TROPONIN I  CBC WITH DIFFERENTIAL/PLATELET  INFLUENZA PANEL BY PCR (TYPE A & B)  TROPONIN I  CBG MONITORING, ED  CBG MONITORING, ED   ____________________________________________  EKG EKG  read interpreted by me shows sinus tachycardia rate of 109 normal axis there is some hyperacute T waves present but these were present previously.  There is ST segment elevation in lead II half the complexes in lead III have some ST segment elevation in aVF has some PR segment depression there may be a little bit of ST elevation in V4 as well.  I have called the cardiologist Dr. Burt Knack and waiting for him to look at the EKG and call me back.  The patient is no chest pain or tightness just the epigastric pain to palpation.  _Dr. Burt Knack calls back.  He is reviewed the old EKGs and the present when he does not think that this is an acute MI.  However we will as we discussed repeat his troponin.  Probably do a CT of his belly as his flank pain may be due to renal colic as I noted in the initial impression.  ___________________________________________  RADIOLOGY  ED MD interpretation: Acute abdominal series read by me read by radiology as no acute disease. CT renal read as no acute disease  Official radiology report(s):  Dg Abdomen Acute W/chest  Result Date: 05/24/2018 CLINICAL DATA:  Acute upper abdominal pain. EXAM: DG ABDOMEN ACUTE W/ 1V CHEST COMPARISON:   Radiograph of March 24, 2018. FINDINGS: There is no evidence of dilated bowel loops or free intraperitoneal air. No radiopaque calculi or other significant radiographic abnormality is seen. Heart size and mediastinal contours are within normal limits. Both lungs are clear. IMPRESSION: No evidence of bowel obstruction or ileus. No acute cardiopulmonary disease. Electronically Signed   By: Marijo Conception, M.D.   On: 05/24/2018 12:25   Ct Renal Stone Study  Result Date: 05/24/2018 CLINICAL DATA:  Acute upper abdominal pain. EXAM: CT ABDOMEN AND PELVIS WITHOUT CONTRAST TECHNIQUE: Multidetector CT imaging of the abdomen and pelvis was performed following the standard protocol without IV contrast. COMPARISON:  CT scan of June 16, 2017. FINDINGS: Lower chest: No acute abnormality. Hepatobiliary: No focal liver abnormality is seen. No gallstones, gallbladder wall thickening, or biliary dilatation. Pancreas: Unremarkable. No pancreatic ductal dilatation or surrounding inflammatory changes. Spleen: Normal in size without focal abnormality. Adrenals/Urinary Tract: Adrenal glands are unremarkable. Kidneys are normal, without renal calculi, focal lesion, or hydronephrosis. Bladder is unremarkable. Stomach/Bowel: Stomach is within normal limits. Appendix appears normal. No evidence of bowel wall thickening, distention, or inflammatory changes. Vascular/Lymphatic: No significant vascular findings are present. No enlarged abdominal or pelvic lymph nodes. Reproductive: Prostate is unremarkable. Other: No abdominal wall hernia or abnormality. No abdominopelvic ascites. Musculoskeletal: No acute or significant osseous findings. IMPRESSION: No definite abnormality seen in the abdomen or pelvis. Electronically Signed   By: Marijo Conception, M.D.   On: 05/24/2018 13:19    ____________________________________________   PROCEDURES  Procedure(s) performed:  Procedures  Critical Care performed:    ____________________________________________   INITIAL IMPRESSION / ASSESSMENT AND PLAN / ED COURSE  Labs are okay except for his blood sugar being elevated.  Troponin is negative give him some fluids and see how he does.  He does have some red cells in his urine.  This in the flank pain could be indicative of renal colic, there is no stone on the plain films.  We will see how he does with the fluid and possibly do a CT scan.  ----------------------------------------- 2:38 PM on 05/24/2018 -----------------------------------------  Second troponin is negative I have tried to evaluate the patient's fundi is very difficult to see anything  as he keeps moving his eyes around.  I do not see any obvious pathology but again very limited exam.  CT of the abdomen does not show any renal stones second EKG shows normal sinus rhythm at a rate of 97 normal axis very similar to the first 1 ST elevation in 2 is now gone and there is some ST elevation in F patient's blood pressure is down slightly.  Clinical Course as of May 25 1651  Sat May 24, 2018  1440 Appearance(!): CLEAR [PM]    Clinical Course User Index [PM] Nena Polio, MD   ----------------------------------------- 4:53 PM on 05/24/2018 -----------------------------------------  Patient with no further diarrhea.  Patient blood pressure 372 systolic.  Discussed the patient with hospitalist and with renal.  Renal found an old UA with some blood cells in it as well.  He do not think that this is a hypertensive urgency at this point.  Especially since his blood pressure is down.  We will let him go start him on Norvasc 5 mg a day per renal have him follow-up with his doctor in the next few days.  He will return here if there is any further problems.  ____________________________________________   FINAL CLINICAL IMPRESSION(S) / ED DIAGNOSES  Final diagnoses:  Diarrhea, unspecified type  Hypertension, unspecified type     ED  Discharge Orders         Ordered    amLODipine (NORVASC) 5 MG tablet  Daily     05/24/18 1451           Note:  This document was prepared using Dragon voice recognition software and may include unintentional dictation errors.    Nena Polio, MD 05/24/18 [phone]

## 2018-05-24 NOTE — ED Notes (Signed)
Pt to Xray with xray tech.

## 2018-05-24 NOTE — ED Notes (Signed)
Pt verbalized understanding of d/c instructions, rx, and f/u care. No further questions at this time. Pt ambulatory to the exit with steady gait.

## 2018-05-24 NOTE — ED Notes (Signed)
Pt continues to rest comfortably in bed with rails upx2, on the monitor, call bell is within reach. We will continue to monitor

## 2018-05-24 NOTE — ED Triage Notes (Signed)
Abdominal pain and diarrhea x3 days

## 2018-07-16 IMAGING — CT CT PELVIS W/ CM
2 of 3 series · 16 of 46 positions shown, 18 images · IV contrast (isovue)
Comparison: None.

CLINICAL DATA: Perirectal abscess, evaluate for fistula

EXAM:
CT PELVIS WITH CONTRAST
TECHNIQUE: Multidetector CT imaging of the pelvis was performed using the
standard protocol following the bolus administration of intravenous
contrast.
CONTRAST:  100 mL Isovue 300 IV

[Series 3: axial st · axial · 0.72mm/px · z∈[-956,-706]mm · 13 of 145 slices shown, 15 images]
[im 10/145  soft-tissue]
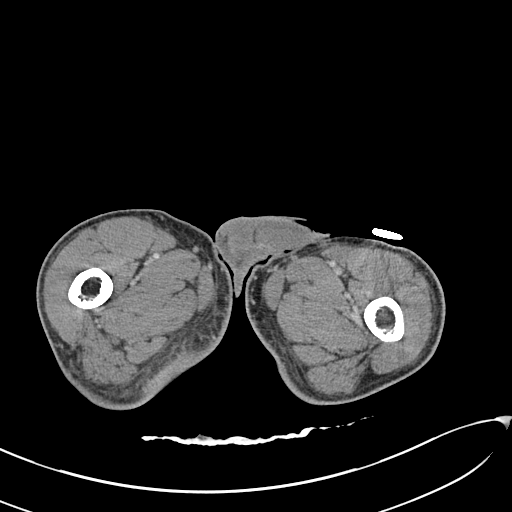
[im 10/145  bone]
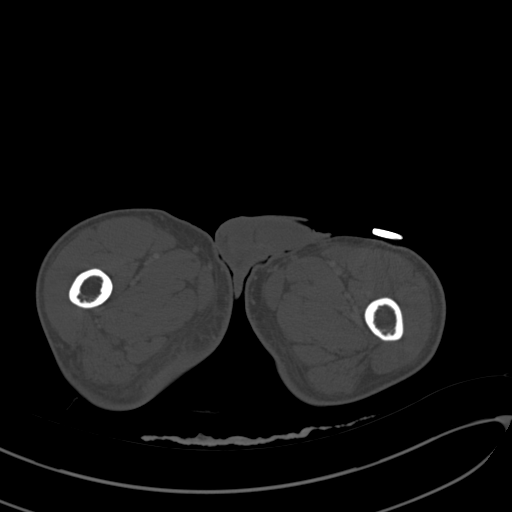
[im 19/145  soft-tissue]
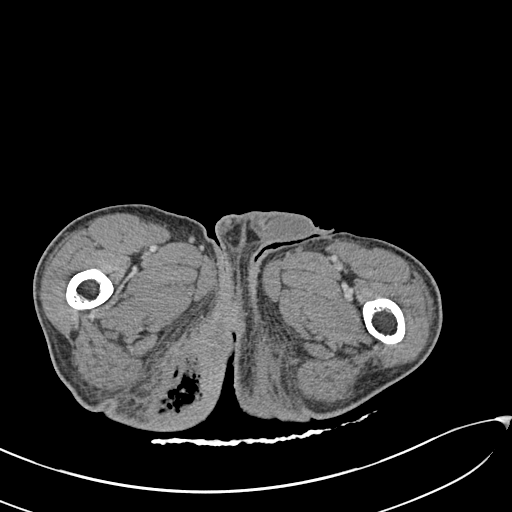
[im 28/145  soft-tissue]
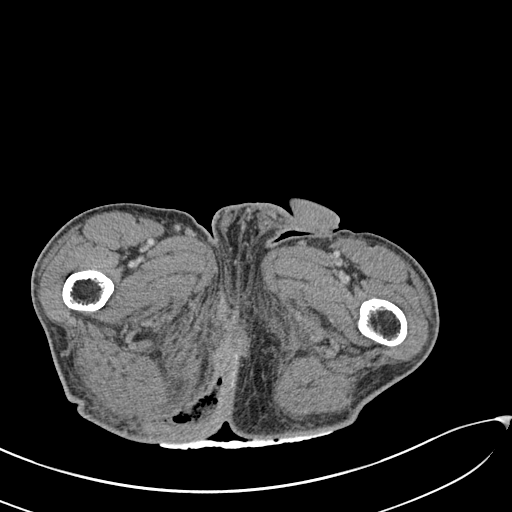
[im 42/145  soft-tissue]
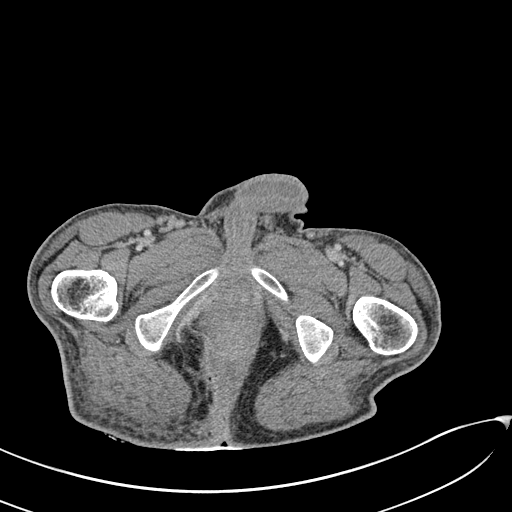
[im 52/145  soft-tissue]
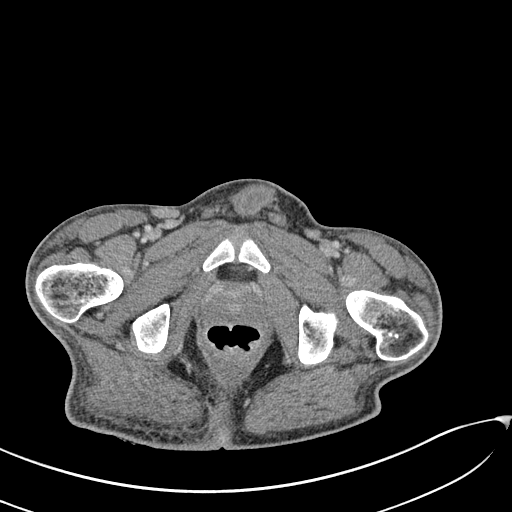
[im 61/145  soft-tissue]
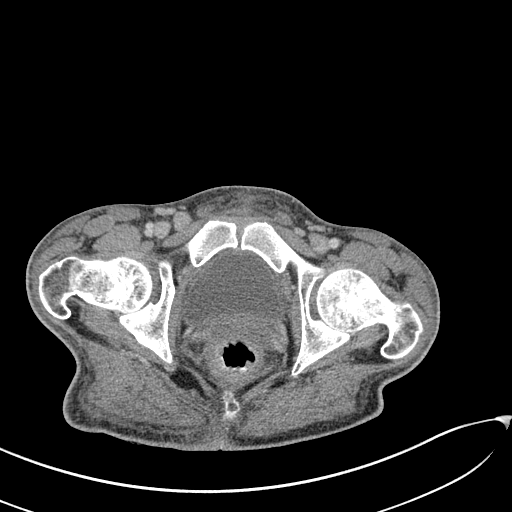
[im 75/145  soft-tissue]
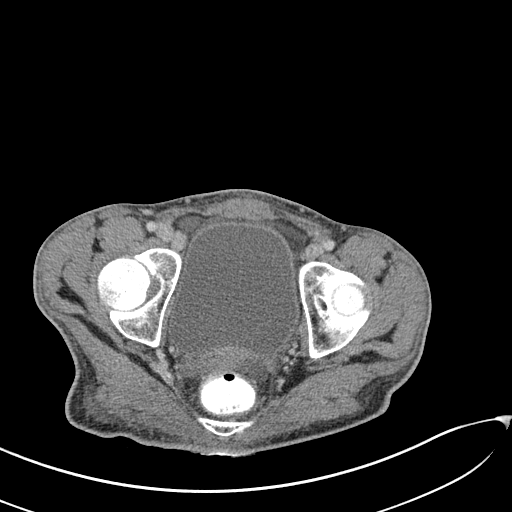
[im 84/145  soft-tissue]
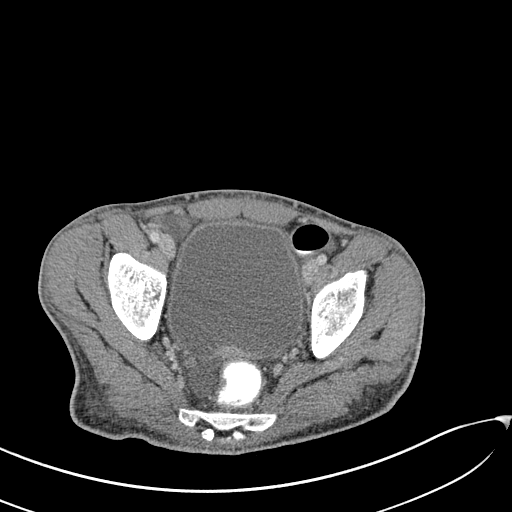
[im 93/145  soft-tissue]
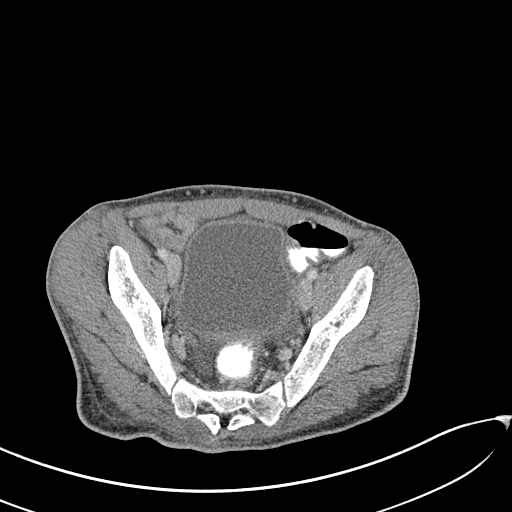
[im 93/145  bone]
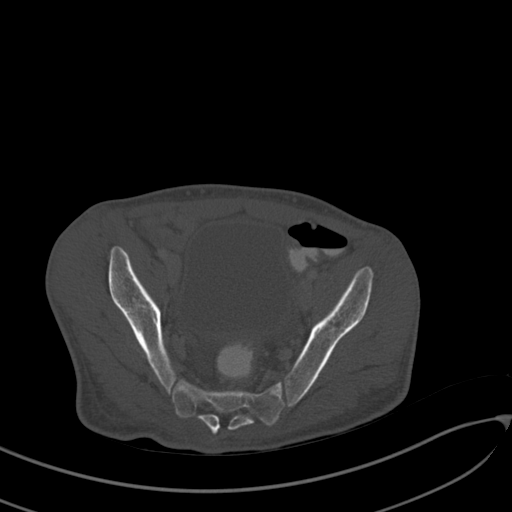
[im 103/145  soft-tissue]
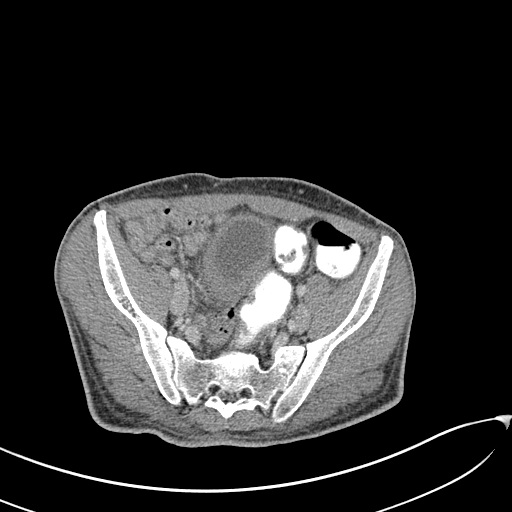
[im 117/145  soft-tissue]
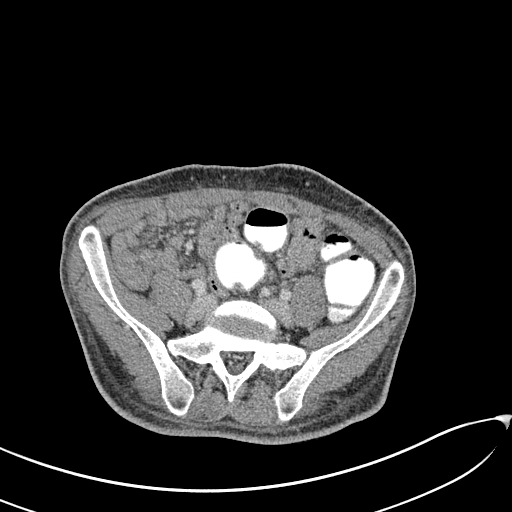
[im 126/145  soft-tissue]
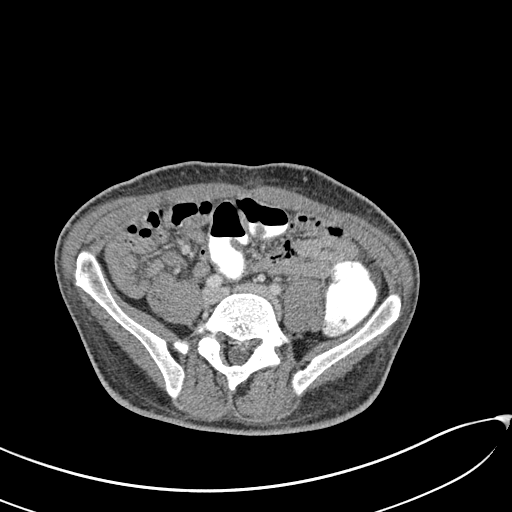
[im 135/145  soft-tissue]
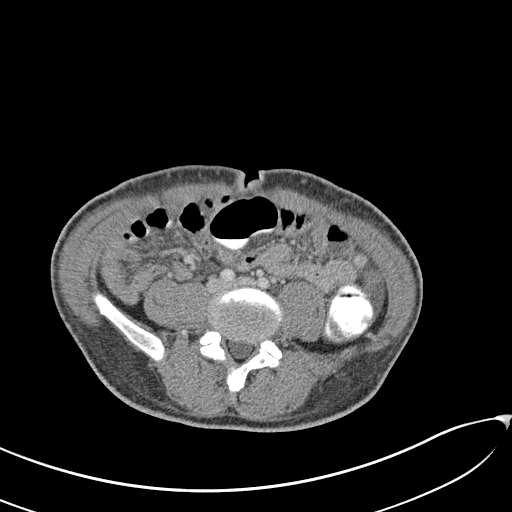

[Series 6: coronal st · coronal · 0.56mm/px · 3 of 106 slices shown]
[im 36/106  soft-tissue]
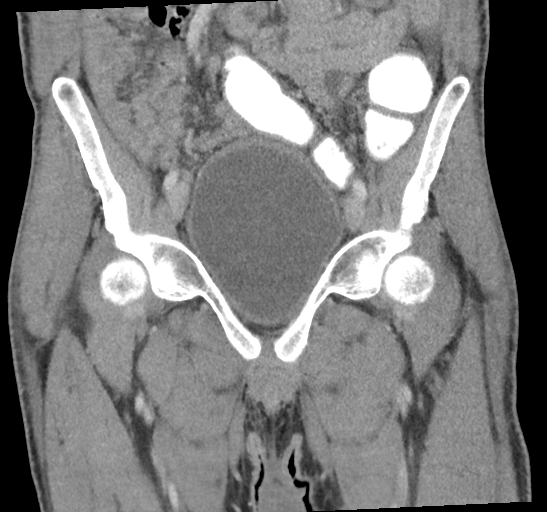
[im 47/106  soft-tissue]
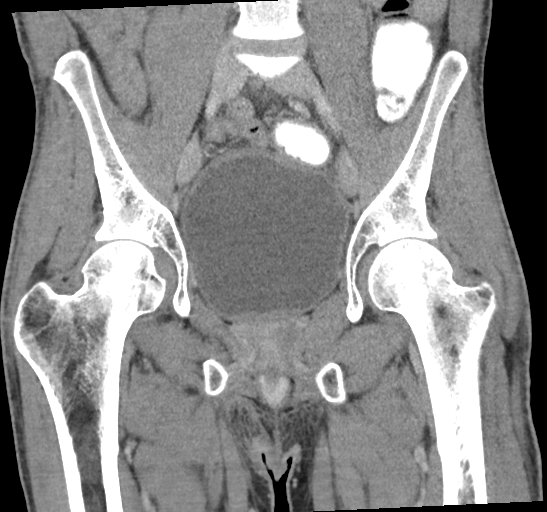
[im 59/106  soft-tissue]
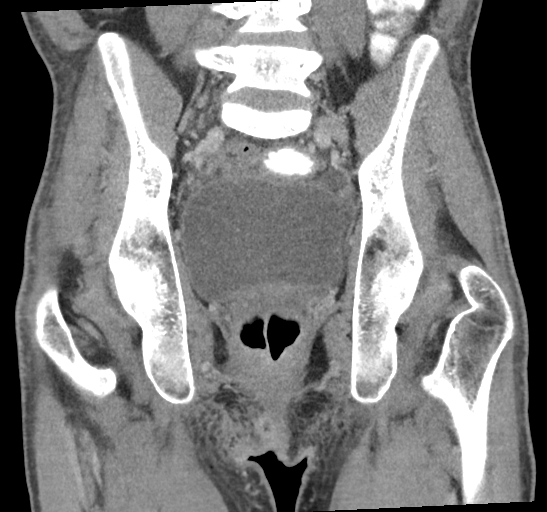

[16 of 46 positions shown; findings below may reference images not displayed]

FINDINGS: Urinary Tract:  Bladder is within normal limits.

Bowel: Rectal contrast has been administered. Perirectal fluid along
the right posterolateral aspect of the anorectal region (series
3/image 97), extending into the medial right gluteal region/buttock,
suggesting anorectal fistula with perirectal abscess.

However, a discrete fistulous tract is not evident on CT.

Linear extraluminal contrast is present on the current study (series
3/image 114), but this reflects contaminant, likely related to
removal of the rectal tube.

Vascular/Lymphatic: No evidence of aneurysm.

No suspicious pelvic lymphadenopathy.

Reproductive:  Prostate is unremarkable.

Other:  Small volume pelvic ascites.

Musculoskeletal: Gas and fluid collection in the right medial
gluteal/buttock region, measuring 2.8 x 6.4 cm in maximal axial
dimension (series 3/image 123), at which level this reflects
predominantly gas. Superiorly, this reflects fluid tracking medially
towards the anorectal region (series 3/images 97, 101, and 108). The
full craniocaudal extent of the abnormality is approximately 7.1 cm
(sagittal image 74).
IMPRESSION: 2.8 x 6.4 cm gas and fluid collection in the right medial
gluteal/buttock region, with additional perirectal fluid along the
posterolateral aspect of the anorectal junction.

This appearance suggests anorectal fistula with perirectal abscess.
However, a discrete fistulous tract is not evident on CT.

The gas present in the soft tissues is presumed to be secondary to
intervention and/or fistulous communication with rectum. Notably,
soft tissue gas was likely present at the time of prior ultrasound
on 06/03/2017, which supports this presumption. However, if there is
clinical concern for necrotizing infection, consider emergent
surgical evaluation.

These results were called by telephone at the time of interpretation
acknowledged these results.

## 2018-09-09 ENCOUNTER — Emergency Department: Payer: Self-pay

## 2018-09-09 ENCOUNTER — Observation Stay
Admission: EM | Admit: 2018-09-09 | Discharge: 2018-09-10 | Disposition: A | Discharge status: Home / Self Care | Payer: Self-pay | Attending: Internal Medicine | Admitting: Internal Medicine

## 2018-09-09 ENCOUNTER — Other Ambulatory Visit: Payer: Self-pay

## 2018-09-09 DIAGNOSIS — E1010 Type 1 diabetes mellitus with ketoacidosis without coma: Secondary | ICD-10-CM | POA: Insufficient documentation

## 2018-09-09 DIAGNOSIS — R739 Hyperglycemia, unspecified: Secondary | ICD-10-CM

## 2018-09-09 DIAGNOSIS — F17210 Nicotine dependence, cigarettes, uncomplicated: Secondary | ICD-10-CM | POA: Insufficient documentation

## 2018-09-09 DIAGNOSIS — Z833 Family history of diabetes mellitus: Secondary | ICD-10-CM | POA: Insufficient documentation

## 2018-09-09 DIAGNOSIS — Z794 Long term (current) use of insulin: Secondary | ICD-10-CM | POA: Insufficient documentation

## 2018-09-09 DIAGNOSIS — R197 Diarrhea, unspecified: Secondary | ICD-10-CM | POA: Insufficient documentation

## 2018-09-09 DIAGNOSIS — R112 Nausea with vomiting, unspecified: Secondary | ICD-10-CM | POA: Insufficient documentation

## 2018-09-09 DIAGNOSIS — R1010 Upper abdominal pain, unspecified: Principal | ICD-10-CM | POA: Insufficient documentation

## 2018-09-09 DIAGNOSIS — R109 Unspecified abdominal pain: Secondary | ICD-10-CM | POA: Diagnosis present

## 2018-09-09 DIAGNOSIS — I10 Essential (primary) hypertension: Secondary | ICD-10-CM | POA: Insufficient documentation

## 2018-09-09 DIAGNOSIS — R1084 Generalized abdominal pain: Secondary | ICD-10-CM

## 2018-09-09 LAB — URINALYSIS, COMPLETE (UACMP) WITH MICROSCOPIC
Bacteria, UA: NONE SEEN
Bilirubin Urine: NEGATIVE
Glucose, UA: >=500 mg/dL — AB
Ketones, ur: 5 mg/dL — AB
Leukocytes,Ua: NEGATIVE
Nitrite: NEGATIVE
Protein, ur: 100 mg/dL — AB
Specific Gravity, Urine: 1.031 — ABNORMAL HIGH (ref 1.005–1.030)
Squamous Epithelial / HPF: NONE SEEN (ref 0–5)
pH: 8 (ref 5.0–8.0)

## 2018-09-09 LAB — COMPREHENSIVE METABOLIC PANEL
ALT: 21 U/L (ref 0–44)
AST: 18 U/L (ref 15–41)
Albumin: 4.4 g/dL (ref 3.5–5.0)
Alkaline Phosphatase: 141 U/L — ABNORMAL HIGH (ref 38–126)
Anion gap: 9 (ref 5–15)
BUN: 10 mg/dL (ref 6–20)
CO2: 28 mmol/L (ref 22–32)
Calcium: 9.4 mg/dL (ref 8.9–10.3)
Chloride: 97 mmol/L — ABNORMAL LOW (ref 98–111)
Creatinine, Ser: 0.82 mg/dL (ref 0.61–1.24)
GFR calc Af Amer: >60 mL/min (ref >60)
GFR calc non Af Amer: >60 mL/min (ref >60)
Glucose, Bld: 479 mg/dL — ABNORMAL HIGH (ref 70–99)
Potassium: 4.4 mmol/L (ref 3.5–5.1)
Sodium: 134 mmol/L — ABNORMAL LOW (ref 135–145)
Total Bilirubin: 1.4 mg/dL — ABNORMAL HIGH (ref 0.3–1.2)
Total Protein: 8 g/dL (ref 6.5–8.1)

## 2018-09-09 LAB — CBC WITH DIFFERENTIAL/PLATELET
Abs Immature Granulocytes: 0.01 10*3/uL (ref 0.00–0.07)
Basophils Absolute: 0 10*3/uL (ref 0.0–0.1)
Basophils Relative: 0 %
Eosinophils Absolute: 0.1 10*3/uL (ref 0.0–0.5)
Eosinophils Relative: 1 %
HCT: 44.4 % (ref 39.0–52.0)
Hemoglobin: 15.1 g/dL (ref 13.0–17.0)
Immature Granulocytes: 0 %
Lymphocytes Relative: 27 %
Lymphs Abs: 1.7 10*3/uL (ref 0.7–4.0)
MCH: 29.3 pg (ref 26.0–34.0)
MCHC: 34 g/dL (ref 30.0–36.0)
MCV: 86 fL (ref 80.0–100.0)
Monocytes Absolute: 0.4 10*3/uL (ref 0.1–1.0)
Monocytes Relative: 7 %
Neutro Abs: 4 10*3/uL (ref 1.7–7.7)
Neutrophils Relative %: 65 %
Platelets: 227 10*3/uL (ref 150–400)
RBC: 5.16 MIL/uL (ref 4.22–5.81)
RDW: 11.7 % (ref 11.5–15.5)
WBC: 6.2 10*3/uL (ref 4.0–10.5)
nRBC: 0 % (ref 0.0–0.2)

## 2018-09-09 LAB — HEMOGLOBIN A1C
Hgb A1c MFr Bld: 13.3 % — ABNORMAL HIGH (ref 4.8–5.6)
Mean Plasma Glucose: 335.01 mg/dL

## 2018-09-09 LAB — BLOOD GAS, VENOUS
Acid-Base Excess: 4.7 mmol/L — ABNORMAL HIGH (ref 0.0–2.0)
Bicarbonate: 32 mmol/L — ABNORMAL HIGH (ref 20.0–28.0)
O2 Saturation: 55.7 %
Patient temperature: 37
pCO2, Ven: 58 mmHg (ref 44.0–60.0)
pH, Ven: 7.35 (ref 7.250–7.430)
pO2, Ven: 31 mmHg — CL (ref 32.0–45.0)

## 2018-09-09 LAB — BETA-HYDROXYBUTYRIC ACID: Beta-Hydroxybutyric Acid: 0.86 mmol/L — ABNORMAL HIGH (ref 0.05–0.27)

## 2018-09-09 LAB — GLUCOSE, CAPILLARY
Glucose-Capillary: 416 mg/dL — ABNORMAL HIGH (ref 70–99)
Glucose-Capillary: 258 mg/dL — ABNORMAL HIGH (ref 70–99)
Glucose-Capillary: 91 mg/dL (ref 70–99)
Glucose-Capillary: 173 mg/dL — ABNORMAL HIGH (ref 70–99)

## 2018-09-09 LAB — LIPASE, BLOOD: Lipase: 25 U/L (ref 11–51)

## 2018-09-09 LAB — TSH: TSH: 1.072 u[IU]/mL (ref 0.350–4.500)

## 2018-09-09 MED ORDER — INSULIN ASPART 100 UNIT/ML ~~LOC~~ SOLN
0.0000 [IU] | Freq: Three times a day (TID) | SUBCUTANEOUS | Status: DC
  Administered 2018-09-09: 8 [IU] via SUBCUTANEOUS
  Administered 2018-09-10: 3 [IU] via SUBCUTANEOUS
  Filled 2018-09-09 (×2): qty 1

## 2018-09-09 MED ORDER — HYDRALAZINE HCL 20 MG/ML IJ SOLN
10.0000 mg | Freq: Four times a day (QID) | INTRAMUSCULAR | Status: DC | PRN
  Administered 2018-09-09 – 2018-09-10 (×2): 10 mg via INTRAVENOUS
  Filled 2018-09-09 (×2): qty 1

## 2018-09-09 MED ORDER — METOCLOPRAMIDE HCL 5 MG/ML IJ SOLN
10.0000 mg | Freq: Once | INTRAMUSCULAR | Status: AC
  Administered 2018-09-09: 10 mg via INTRAVENOUS
  Filled 2018-09-09: qty 2

## 2018-09-09 MED ORDER — ACETAMINOPHEN 650 MG RE SUPP: 650.0000 mg | Freq: Four times a day (QID) | RECTAL | Status: DC | PRN

## 2018-09-09 MED ORDER — HYDRALAZINE HCL 20 MG/ML IJ SOLN
10.0000 mg | Freq: Four times a day (QID) | INTRAMUSCULAR | Status: DC | PRN
  Administered 2018-09-09: 10 mg via INTRAVENOUS
  Filled 2018-09-09: qty 1

## 2018-09-09 MED ORDER — MORPHINE SULFATE (PF) 4 MG/ML IV SOLN
4.0000 mg | Freq: Once | INTRAVENOUS | Status: AC
  Administered 2018-09-09: 4 mg via INTRAVENOUS
  Filled 2018-09-09: qty 1

## 2018-09-09 MED ORDER — SODIUM CHLORIDE 0.9 % IV SOLN
INTRAVENOUS | Status: DC
  Administered 2018-09-09 – 2018-09-10 (×4): via INTRAVENOUS

## 2018-09-09 MED ORDER — ONDANSETRON HCL 4 MG/2ML IJ SOLN
4.0000 mg | Freq: Once | INTRAMUSCULAR | Status: AC
  Administered 2018-09-09: 4 mg via INTRAVENOUS
  Filled 2018-09-09: qty 2

## 2018-09-09 MED ORDER — INSULIN ASPART 100 UNIT/ML ~~LOC~~ SOLN
5.0000 [IU] | Freq: Once | SUBCUTANEOUS | Status: AC
  Administered 2018-09-09: 5 [IU] via SUBCUTANEOUS
  Filled 2018-09-09: qty 1

## 2018-09-09 MED ORDER — ONDANSETRON HCL 4 MG PO TABS: 4.0000 mg | ORAL_TABLET | Freq: Four times a day (QID) | ORAL | Status: DC | PRN

## 2018-09-09 MED ORDER — ACETAMINOPHEN 325 MG PO TABS: 650.0000 mg | ORAL_TABLET | Freq: Four times a day (QID) | ORAL | Status: DC | PRN

## 2018-09-09 MED ORDER — INSULIN ASPART 100 UNIT/ML ~~LOC~~ SOLN
4.0000 [IU] | Freq: Three times a day (TID) | SUBCUTANEOUS | Status: DC
  Administered 2018-09-10: 4 [IU] via SUBCUTANEOUS
  Filled 2018-09-09: qty 1

## 2018-09-09 MED ORDER — ONDANSETRON HCL 4 MG/2ML IJ SOLN
4.0000 mg | Freq: Four times a day (QID) | INTRAMUSCULAR | Status: DC | PRN
  Administered 2018-09-10: 4 mg via INTRAVENOUS
  Filled 2018-09-09: qty 2

## 2018-09-09 MED ORDER — SODIUM CHLORIDE 0.9 % IV SOLN
Freq: Once | INTRAVENOUS | Status: AC
  Administered 2018-09-09: 09:00:00 via INTRAVENOUS

## 2018-09-09 MED ORDER — INSULIN GLARGINE 100 UNIT/ML ~~LOC~~ SOLN
30.0000 [IU] | Freq: Every day | SUBCUTANEOUS | Status: DC
  Administered 2018-09-09: 30 [IU] via SUBCUTANEOUS
  Filled 2018-09-09 (×2): qty 0.3

## 2018-09-09 MED ORDER — HYDROCODONE-ACETAMINOPHEN 5-325 MG PO TABS
1.0000 | ORAL_TABLET | ORAL | Status: DC | PRN
  Filled 2018-09-09: qty 2

## 2018-09-09 MED ORDER — MORPHINE SULFATE (PF) 2 MG/ML IV SOLN
2.0000 mg | INTRAVENOUS | Status: DC | PRN
  Administered 2018-09-09 – 2018-09-10 (×4): 2 mg via INTRAVENOUS
  Filled 2018-09-09 (×5): qty 1

## 2018-09-09 MED ORDER — ENOXAPARIN SODIUM 40 MG/0.4ML ~~LOC~~ SOLN
40.0000 mg | SUBCUTANEOUS | Status: DC
  Administered 2018-09-09: 40 mg via SUBCUTANEOUS
  Filled 2018-09-09: qty 0.4

## 2018-09-09 NOTE — ED Notes (Signed)
Pt ambulatory to toilet independently. 

## 2018-09-09 NOTE — ED Notes (Signed)
Patient transported to X-ray 

## 2018-09-09 NOTE — ED Triage Notes (Signed)
Pt c/o upper and pain with N/V/D for the past week.[name]

## 2018-09-09 NOTE — ED Notes (Signed)
ED TO INPATIENT HANDOFF REPORT  ED Nurse Name and Phone #: Caryl Pina, [place]  S Name/Age/Gender Jonathan Mcmillan 23 y.o. male Room/Bed: ED03A/ED03A  Code Status   Code Status: Prior  Home/SNF/Other Home Patient oriented to: self, place, time and situation Is this baseline? Yes   Triage Complete: Triage complete  Chief Complaint abd pain  Triage Note Pt c/o upper and pain with N/V/D for the past week..   Allergies No Known Allergies  Level of Care/Admitting Diagnosis ED Disposition    ED Disposition Condition [facility] Area: [place] [[id number]]  Level of Care: Med-Surg [16]  Covid Evaluation: N/A  Diagnosis: Abdominal pain [035597]  Admitting Physician: Dustin Flock [[id number]]  Attending Physician: Dustin Flock [[id number]]  PT Class (Do Not Modify): Observation [104]  PT Acc Code (Do Not Modify): Observation [10022]       B Medical/Surgery History Past Medical History:  Diagnosis Date  . Diabetes 1.5, managed as type 1 ([place])   . Perirectal abscess 06/16/2017   Past Surgical History:  Procedure Laterality Date  . INCISION AND DRAINAGE PERIRECTAL ABSCESS N/A 06/16/2017   Procedure: IRRIGATION AND DEBRIDEMENT PERIRECTAL ABSCESS;  Surgeon: Florene Glen, MD;  Location: [facility] ORS;  Service: General;  Laterality: N/A;  . none       A IV Location/Drains/Wounds Patient Lines/Drains/Airways Status   Active Line/Drains/Airways    Name:   Placement date:   Placement time:   Site:   Days:   Peripheral IV 09/09/18 Left Forearm   09/09/18    0858    Forearm   less than 1   Incision (Closed) 01/26/18 Arm Right   01/26/18    2224     226          Intake/Output Last 24 hours  Intake/Output Summary (Last 24 hours) at 09/09/2018 1045 Last data filed at 09/09/2018 0939 Gross per 24 hour  Intake 1000 ml  Output -  Net 1000 ml    Labs/Imaging Results for orders placed or performed during the hospital encounter of 09/09/18  (from the past 48 hour(s))  Glucose, capillary     Status: Abnormal   Collection Time: 09/09/18  8:25 AM  Result Value Ref Range   Glucose-Capillary 416 (H) 70 - 99 mg/dL  CBC with Differential     Status: None   Collection Time: 09/09/18  8:44 AM  Result Value Ref Range   WBC 6.2 4.0 - 10.5 K/uL   RBC 5.16 4.22 - 5.81 MIL/uL   Hemoglobin 15.1 13.0 - 17.0 g/dL   HCT 44.4 39.0 - 52.0 %   MCV 86.0 80.0 - 100.0 fL   MCH 29.3 26.0 - 34.0 pg   MCHC 34.0 30.0 - 36.0 g/dL   RDW 11.7 11.5 - 15.5 %   Platelets 227 150 - 400 K/uL   nRBC 0.0 0.0 - 0.2 %   Neutrophils Relative % 65 %   Neutro Abs 4.0 1.7 - 7.7 K/uL   Lymphocytes Relative 27 %   Lymphs Abs 1.7 0.7 - 4.0 K/uL   Monocytes Relative 7 %   Monocytes Absolute 0.4 0.1 - 1.0 K/uL   Eosinophils Relative 1 %   Eosinophils Absolute 0.1 0.0 - 0.5 K/uL   Basophils Relative 0 %   Basophils Absolute 0.0 0.0 - 0.1 K/uL   Immature Granulocytes 0 %   Abs Immature Granulocytes 0.01 0.00 - 0.07 K/uL    Comment: Performed at [facility],  [place], [place] [address]  Comprehensive metabolic panel     Status: Abnormal   Collection Time: 09/09/18  8:44 AM  Result Value Ref Range   Sodium 134 (L) 135 - 145 mmol/L   Potassium 4.4 3.5 - 5.1 mmol/L   Chloride 97 (L) 98 - 111 mmol/L   CO2 28 22 - 32 mmol/L   Glucose, Bld 479 (H) 70 - 99 mg/dL   BUN 10 6 - 20 mg/dL   Creatinine, Ser 0.82 0.61 - 1.24 mg/dL   Calcium 9.4 8.9 - 10.3 mg/dL   Total Protein 8.0 6.5 - 8.1 g/dL   Albumin 4.4 3.5 - 5.0 g/dL   AST 18 15 - 41 U/L   ALT 21 0 - 44 U/L   Alkaline Phosphatase 141 (H) 38 - 126 U/L   Total Bilirubin 1.4 (H) 0.3 - 1.2 mg/dL   GFR calc non Af Amer >60 >60 mL/min   GFR calc Af Amer >60 >60 mL/min   Anion gap 9 5 - 15    Comment: Performed at [facility], [address] [place], [place] [address]  Lipase, blood     Status: None   Collection Time: 09/09/18  8:44 AM  Result Value Ref Range   Lipase 25 11 -  51 U/L    Comment: Performed at [facility], [address] [place], [place] [address]  Urinalysis, Complete w Microscopic     Status: Abnormal   Collection Time: 09/09/18  8:44 AM  Result Value Ref Range   Color, Urine STRAW (A) YELLOW   APPearance CLEAR (A) CLEAR   Specific Gravity, Urine 1.031 (H) 1.005 - 1.030   pH 8.0 5.0 - 8.0   Glucose, UA >=500 (A) NEGATIVE mg/dL   Hgb urine dipstick MODERATE (A) NEGATIVE   Bilirubin Urine NEGATIVE NEGATIVE   Ketones, ur 5 (A) NEGATIVE mg/dL   Protein, ur 100 (A) NEGATIVE mg/dL   Nitrite NEGATIVE NEGATIVE   Leukocytes,Ua NEGATIVE NEGATIVE   RBC / HPF 21-50 0 - 5 RBC/hpf   WBC, UA 0-5 0 - 5 WBC/hpf   Bacteria, UA NONE SEEN NONE SEEN   Squamous Epithelial / LPF NONE SEEN 0 - 5    Comment: Performed at [facility], [address] [place], [place] [address]  Beta-hydroxybutyric acid     Status: Abnormal   Collection Time: 09/09/18  8:44 AM  Result Value Ref Range   Beta-Hydroxybutyric Acid 0.86 (H) 0.05 - 0.27 mmol/L    Comment: Performed at [facility], [address] [place], [place] [address]  Blood gas, venous     Status: Abnormal   Collection Time: 09/09/18  8:44 AM  Result Value Ref Range   pH, Ven 7.35 7.250 - 7.430   pCO2, Ven 58 44.0 - 60.0 mmHg   pO2, Ven 31.0 (LL) 32.0 - 45.0 mmHg   Bicarbonate 32.0 (H) 20.0 - 28.0 mmol/L   Acid-Base Excess 4.7 (H) 0.0 - 2.0 mmol/L   O2 Saturation 55.7 %   Patient temperature 37.0    Collection site VEIN    Sample type VEIN     Comment: Performed at [facility], [address] [place], [place] [address]   Dg Abdomen Acute W/chest  Result Date: 09/09/2018 CLINICAL DATA:  Abdominal pain EXAM: DG ABDOMEN ACUTE W/ 1V CHEST COMPARISON:  Abdomen series May 24, 2018; CT abdomen and pelvis May 24, 2018 FINDINGS: PA chest: There is no appreciable edema or  consolidation. Heart size and pulmonary vascularity are normal. No adenopathy. Supine and  upright abdomen: There is moderate stool in the colon. There is no bowel dilatation or air-fluid level to suggest bowel obstruction. No free air. No abnormal calcifications. IMPRESSION: No bowel obstruction or free air.  No edema or consolidation. Electronically Signed   By: Lowella Grip III M.D.   On: 09/09/2018 08:50    Pending Labs [employer] (From admission, onward)    Start     Ordered   Signed and Held  HIV antibody (Routine Testing)  Once,   R     Signed and Held   Signed and Held  CBC  (enoxaparin (LOVENOX)    CrCl >/= 30 ml/min)  Once,   R    Comments:  Baseline for enoxaparin therapy IF NOT ALREADY DRAWN.  Notify MD if PLT < 100 K.    Signed and Held   Signed and Held  Creatinine, serum  (enoxaparin (LOVENOX)    CrCl >/= 30 ml/min)  Once,   R    Comments:  Baseline for enoxaparin therapy IF NOT ALREADY DRAWN.    Signed and Held   Signed and Held  Creatinine, serum  (enoxaparin (LOVENOX)    CrCl >/= 30 ml/min)  Weekly,   R    Comments:  while on enoxaparin therapy    Signed and Held   Signed and Held  Hemoglobin A1c  Once,   R     Signed and Held   Signed and Held  TSH  Once,   R     Signed and Held   Signed and Held  CBC  Tomorrow morning,   R     Signed and Held   Signed and Held  Basic metabolic panel  Tomorrow morning,   R     Signed and Held          Vitals/Pain Today's Vitals   09/09/18 0826 09/09/18 0900 09/09/18 0909 09/09/18 0941  BP: (!) 205/139 (!) 183/118    Pulse: (!) 108 (!) 107    Resp: 14 12    Temp: 98 F (36.7 C)     TempSrc: Oral     SpO2: 100% 100%    Weight: 62.6 kg     Height: 5\' 8"  (1.727 m)     PainSc: 8   8  6      Isolation Precautions No active isolations  Medications Medications  hydrALAZINE (APRESOLINE) injection 10 mg (has no administration in time range)  insulin aspart (novoLOG) injection 0-15 Units (has no administration in time range)  insulin aspart (novoLOG) injection 4 Units (has no administration in time  range)  0.9 %  sodium chloride infusion ( Intravenous Stopped 09/09/18 0939)  morphine 4 MG/ML injection 4 mg (4 mg Intravenous Given 09/09/18 0902)  ondansetron (ZOFRAN) injection 4 mg (4 mg Intravenous Given 09/09/18 0902)  metoCLOPramide (REGLAN) injection 10 mg (10 mg Intravenous Given 09/09/18 0938)  insulin aspart (novoLOG) injection 5 Units (5 Units Subcutaneous Given 09/09/18 1019)    Mobility walks Low fall risk   Focused Assessments N/A   R Recommendations: See Admitting Provider Note  Report given to:   Additional Notes: N/A

## 2018-09-09 NOTE — ED Provider Notes (Signed)
[facility] Emergency Department Provider Note       Time seen: ----------------------------------------- 8:14 AM on 09/09/2018 -----------------------------------------   I have reviewed the triage vital signs and the nursing notes.  HISTORY   Chief Complaint Abdominal Pain    HPI Jonathan Mcmillan is a 23 y.o. male with a history of diabetes, cellulitis who presents to the ED for upper abdominal pain with nausea, vomiting and diarrhea for the past week.  Patient states he has had upper abdominal pain that radiates into his chest, nothing makes it better or worse.  Patient reports he is taking his insulin as scheduled, does not feel like he is in DKA.  Pain is currently 10 out of 10.  Past Medical History:  Diagnosis Date  . Diabetes 1.5, managed as type 1 ([place])   . Perirectal abscess 06/16/2017    Patient Active Problem List   Diagnosis Date Noted  . Right arm cellulitis 01/26/2018  . Sepsis ([place]) 03/06/2017  . Malnutrition of moderate degree 05/22/2016  . DKA (diabetic ketoacidoses) ([place]) 05/21/2016  . Type I (juvenile type) diabetes mellitus without mention of complication, uncontrolled 10/05/2010  . Hypertension 10/05/2010  . Obesity 10/05/2010    Past Surgical History:  Procedure Laterality Date  . INCISION AND DRAINAGE PERIRECTAL ABSCESS N/A 06/16/2017   Procedure: IRRIGATION AND DEBRIDEMENT PERIRECTAL ABSCESS;  Surgeon: Florene Glen, MD;  Location: [facility] ORS;  Service: General;  Laterality: N/A;  . none      Allergies Patient has no known allergies.  Social History Social History   Tobacco Use  . Smoking status: Current Every Day Smoker    Packs/day: 0.50    Types: Cigarettes  . Smokeless tobacco: Never Used  Substance Use Topics  . Alcohol use: No  . Drug use: Yes    Types: Marijuana   Review of Systems Constitutional: Negative for fever. Cardiovascular: Negative for chest pain. Respiratory: Negative for shortness of  breath. Gastrointestinal positive for abdominal pain, vomiting or diarrhea Musculoskeletal: Negative for back pain. Skin: Negative for rash. Neurological: Negative for headaches, focal weakness or numbness.  All systems negative/normal/unremarkable except as stated in the HPI  ____________________________________________   PHYSICAL EXAM:  VITAL SIGNS: ED Triage Vitals [09/09/18 0812]  Enc Vitals Group     BP      Pulse      Resp      Temp      Temp src      SpO2      Weight 155 lb (70.3 kg)     Height 6\' 1"  (1.854 m)     Head Circumference      Peak Flow      Pain Score 10     Pain Loc      Pain Edu?      Excl. in [place]?     Constitutional: Alert and oriented.  Chronically ill-appearing, no distress Eyes: Conjunctivae are normal. Normal extraocular movements. ENT      Head: Normocephalic and atraumatic.      Nose: No congestion/rhinnorhea.      Mouth/Throat: Mucous membranes are moist.      Neck: No stridor. Cardiovascular: Normal rate, regular rhythm. No murmurs, rubs, or gallops. Respiratory: Normal respiratory effort without tachypnea nor retractions. Breath sounds are clear and equal bilaterally. No wheezes/rales/rhonchi. Gastrointestinal: Upper abdominal tenderness, no rebound or guarding.  Normal bowel sounds. Musculoskeletal: Nontender with normal range of motion in extremities. No lower extremity tenderness nor edema. Neurologic:  Normal speech  and language. No gross focal neurologic deficits are appreciated.  Skin:  Skin is warm, dry and intact. No rash noted. Psychiatric: Mood and affect are normal. Speech and behavior are normal.  ___________________________________________  ED COURSE:  As part of my medical decision making, I reviewed the following data within the [place] History obtained from family if available, nursing notes, old chart and ekg, as well as notes from prior ED visits. Patient presented for abdominal pain with vomiting  and diarrhea, we will assess with labs and imaging as indicated at this time.   Procedures  Jonathan Mcmillan was evaluated in Emergency Department on 09/09/2018 for the symptoms described in the history of present illness. He was evaluated in the context of the global COVID-19 pandemic, which necessitated consideration that the patient might be at risk for infection with the SARS-CoV-2 virus that causes COVID-19. Institutional protocols and algorithms that pertain to the evaluation of patients at risk for COVID-19 are in a state of rapid change based on information released by regulatory bodies including the CDC and federal and state organizations. These policies and algorithms were followed during the patient's care in the ED.  ____________________________________________   LABS (pertinent positives/negatives)  Labs Reviewed  COMPREHENSIVE METABOLIC PANEL - Abnormal; Notable for the following components:      Result Value   Sodium 134 (*)    Chloride 97 (*)    Glucose, Bld 479 (*)    Alkaline Phosphatase 141 (*)    Total Bilirubin 1.4 (*)    All other components within normal limits  URINALYSIS, COMPLETE (UACMP) WITH MICROSCOPIC - Abnormal; Notable for the following components:   Color, Urine STRAW (*)    APPearance CLEAR (*)    Specific Gravity, Urine 1.031 (*)    Glucose, UA >=500 (*)    Hgb urine dipstick MODERATE (*)    Ketones, ur 5 (*)    Protein, ur 100 (*)    All other components within normal limits  GLUCOSE, CAPILLARY - Abnormal; Notable for the following components:   Glucose-Capillary 416 (*)    All other components within normal limits  BETA-HYDROXYBUTYRIC ACID - Abnormal; Notable for the following components:   Beta-Hydroxybutyric Acid 0.86 (*)    All other components within normal limits  BLOOD GAS, VENOUS - Abnormal; Notable for the following components:   pO2, Ven 31.0 (*)    Bicarbonate 32.0 (*)    Acid-Base Excess 4.7 (*)    All other components within normal  limits  CBC WITH DIFFERENTIAL/PLATELET  LIPASE, BLOOD    RADIOLOGY Images were viewed by me  Acute abdominal series IMPRESSION: No bowel obstruction or free air.  No edema or consolidation.  ____________________________________________   DIFFERENTIAL DIAGNOSIS   Dehydration, electrolyte abnormality, DKA, gastroparesis, gastroenteritis  FINAL ASSESSMENT AND PLAN  Abdominal pain, vomiting and diarrhea, hyperglycemia   Plan: The patient had presented for abdominal pain with vomiting and diarrhea. Patient's labs did indicate some abnormalities including an elevated beta hydroxybutyric acid and hyperglycemia with a blood sugar 479.  He does not appear to be in significant DKA but could be in mild DKA, he currently has a normal anion gap.  He could have gastroparesis and does appear dehydrated.  We have given IV fluids as well as some additional subcutaneous insulin, Zofran and Reglan.. Patient's imaging is unremarkable.  I will discuss with the hospitalist for admission.   Laurence Aly, MD    Note: This note was generated in part or  whole with voice recognition software. Voice recognition is usually quite accurate but there are transcription errors that can and very often do occur. I apologize for any typographical errors that were not detected and corrected.     Earleen Newport, MD 09/09/18 [phone]

## 2018-09-09 NOTE — ED Notes (Signed)
Hospitalist to bedside.

## 2018-09-09 NOTE — Progress Notes (Signed)
Pt BP elevated. Primary nurse paged and spoke to Dr. Brett Albino. MD to place orders.

## 2018-09-09 NOTE — Care Management (Signed)
RNCM contacted [facility] and spoke with Almyra Free. Patient was last seen and last filled insulin Oct. 2018.  He has several no-shows and last scheduled visit was supposed to be Sept.2019. Message also sent to [facility] and [facility] to check status with them.

## 2018-09-09 NOTE — H&P (Signed)
[place] [facility] [place] NAME: Jonathan Mcmillan    MR#:  [id number]  DATE OF BIRTH:  1995/08/14  DATE OF ADMISSION:  09/09/2018  PRIMARY CARE PHYSICIAN: [facility] [place]   REQUESTING/REFERRING PHYSICIAN: Earleen Newport, MD  CHIEF COMPLAINT:   Chief Complaint  Patient presents with  . Abdominal Pain    HISTORY OF PRESENT ILLNESS: Jonathan Mcmillan  is a 23 y.o. male with a known history of  Diabetes on insulin therapy, perirectal abscess and nicotine abuse who presents to the emergency room with complaint of having nausea vomiting and diarrhea for the past week.  Also states that he has been having upper abdominal pain radiating into the chest.  Denies any fevers complains of some chills.  Denies any urinary symptoms.  Evaluation in the ER shows patient to be dehydrated and he is noted to have blood sugars which are very high.  PAST MEDICAL HISTORY:   Past Medical History:  Diagnosis Date  . Diabetes 1.5, managed as type 1 ([place])   . Perirectal abscess 06/16/2017    PAST SURGICAL HISTORY:  Past Surgical History:  Procedure Laterality Date  . INCISION AND DRAINAGE PERIRECTAL ABSCESS N/A 06/16/2017   Procedure: IRRIGATION AND DEBRIDEMENT PERIRECTAL ABSCESS;  Surgeon: Florene Glen, MD;  Location: [facility] ORS;  Service: General;  Laterality: N/A;  . none      SOCIAL HISTORY:  Social History   Tobacco Use  . Smoking status: Current Every Day Smoker    Packs/day: 0.50    Types: Cigarettes  . Smokeless tobacco: Never Used  Substance Use Topics  . Alcohol use: No    FAMILY HISTORY:  Family History  Problem Relation Age of Onset  . Diabetes Mother     DRUG ALLERGIES: No Known Allergies  REVIEW OF SYSTEMS:   CONSTITUTIONAL: No fever, positive fatigue or positive weakness.  EYES: No blurred or double vision.  EARS, NOSE, AND THROAT: No tinnitus or ear pain.  RESPIRATORY: No cough, shortness of breath, wheezing or  hemoptysis.  CARDIOVASCULAR: No chest pain, orthopnea, edema.  GASTROINTESTINAL: Ulcerative nausea, positive vomiting, positive diarrhea or no abdominal pain.  GENITOURINARY: No dysuria, hematuria.  ENDOCRINE: No polyuria, nocturia,  HEMATOLOGY: No anemia, easy bruising or bleeding SKIN: No rash or lesion. MUSCULOSKELETAL: No joint pain or arthritis.   NEUROLOGIC: No tingling, numbness, weakness.  PSYCHIATRY: No anxiety or depression.   MEDICATIONS AT HOME:  Prior to Admission medications   Medication Sig Start Date End Date Taking? Authorizing Provider  insulin aspart protamine- aspart (NOVOLOG MIX 70/30) (70-30) 100 UNIT/ML injection Inject 0.14 mLs (14 Units total) into the skin 2 (two) times daily with a meal. Patient taking differently: Inject 15-25 Units into the skin 2 (two) times daily with a meal.  01/27/18  Yes Mody, Sital, MD  insulin glargine (LANTUS) 100 UNIT/ML injection Inject 30 Units into the skin at bedtime.   Yes [provider]      PHYSICAL EXAMINATION:   VITAL SIGNS: Blood pressure (!) 183/118, pulse (!) 107, temperature 98 F (36.7 C), temperature source Oral, resp. rate 12, height 5\' 8"  (1.727 m), weight 62.6 kg, SpO2 100 %.  GENERAL:  23 y.o.-year-old patient lying in the bed with no acute distress.  EYES: Pupils equal, round, reactive to light and accommodation. No scleral icterus. Extraocular muscles intact.  HEENT: Head atraumatic, normocephalic. Oropharynx and nasopharynx clear.  NECK:  Supple, no jugular venous distention. No thyroid enlargement, no  tenderness.  LUNGS: Normal breath sounds bilaterally, no wheezing, rales,rhonchi or crepitation. No use of accessory muscles of respiration.  CARDIOVASCULAR: S1, S2 normal. No murmurs, rubs, or gallops.  ABDOMEN: Soft, epigastric tenderness, nondistended. Bowel sounds present. No organomegaly or mass.  EXTREMITIES: No pedal edema, cyanosis, or clubbing.  NEUROLOGIC: Cranial nerves II through XII are  intact. Muscle strength 5/5 in all extremities. Sensation intact. Gait not checked.  PSYCHIATRIC: The patient is alert and oriented x 3.  SKIN: No obvious rash, lesion, or ulcer.   LABORATORY PANEL:   CBC Recent Labs  Lab 09/09/18 0844  WBC 6.2  HGB 15.1  HCT 44.4  PLT 227  MCV 86.0  MCH 29.3  MCHC 34.0  RDW 11.7  LYMPHSABS 1.7  MONOABS 0.4  EOSABS 0.1  BASOSABS 0.0   ------------------------------------------------------------------------------------------------------------------  Chemistries  Recent Labs  Lab 09/09/18 0844  NA 134*  K 4.4  CL 97*  CO2 28  GLUCOSE 479*  BUN 10  CREATININE 0.82  CALCIUM 9.4  AST 18  ALT 21  ALKPHOS 141*  BILITOT 1.4*   ------------------------------------------------------------------------------------------------------------------ estimated creatinine clearance is 123 mL/min (by C-G formula based on SCr of 0.82 mg/dL). ------------------------------------------------------------------------------------------------------------------ No results for input(s): TSH, T4TOTAL, T3FREE, THYROIDAB in the last 72 hours.  Invalid input(s): FREET3   Coagulation profile No results for input(s): INR, PROTIME in the last 168 hours. ------------------------------------------------------------------------------------------------------------------- No results for input(s): DDIMER in the last 72 hours. -------------------------------------------------------------------------------------------------------------------  Cardiac Enzymes No results for input(s): CKMB, TROPONINI, MYOGLOBIN in the last 168 hours.  Invalid input(s): CK ------------------------------------------------------------------------------------------------------------------ Invalid input(s): POCBNP  ---------------------------------------------------------------------------------------------------------------  Urinalysis    Component Value Date/Time   COLORURINE STRAW  (A) 09/09/2018 0844   APPEARANCEUR CLEAR (A) 09/09/2018 0844   LABSPEC 1.031 (H) 09/09/2018 0844   PHURINE 8.0 09/09/2018 0844   GLUCOSEU >=500 (A) 09/09/2018 0844   HGBUR MODERATE (A) 09/09/2018 0844   BILIRUBINUR NEGATIVE 09/09/2018 0844   KETONESUR 5 (A) 09/09/2018 0844   PROTEINUR 100 (A) 09/09/2018 0844   UROBILINOGEN 0.2 05/10/2007 2155   NITRITE NEGATIVE 09/09/2018 0844   LEUKOCYTESUR NEGATIVE 09/09/2018 0844     RADIOLOGY: Dg Abdomen Acute W/chest  Result Date: 09/09/2018 CLINICAL DATA:  Abdominal pain EXAM: DG ABDOMEN ACUTE W/ 1V CHEST COMPARISON:  Abdomen series May 24, 2018; CT abdomen and pelvis May 24, 2018 FINDINGS: PA chest: There is no appreciable edema or consolidation. Heart size and pulmonary vascularity are normal. No adenopathy. Supine and upright abdomen: There is moderate stool in the colon. There is no bowel dilatation or air-fluid level to suggest bowel obstruction. No free air. No abnormal calcifications. IMPRESSION: No bowel obstruction or free air.  No edema or consolidation. Electronically Signed   By: Lowella Grip III M.D.   On: 09/09/2018 08:50    EKG: Orders placed or performed in visit on 05/24/18  . EKG 12-Lead    IMPRESSION AND PLAN: Patient is 23 year old with history of diabetes presents with nausea and vomiting and abdominal pain  1.  Abdominal pain nausea vomiting likely due to gastroparesis we will treat patient with IV emetics give IV fluids, if symptoms persist patient will need a CT scan of the abdomen   2.  Diabetes type 1 with poor blood sugar control check a hemoglobin A1c We will continue his Lantus he is on a 7030 as well I will discontinue that place him on pre-meal insulin  3.  Accelerated hypertension we will use IV hydralazine as needed follow blood pressure may need  ACE inhibitor on discharge   4.  Nicotine abuse smoking cessation provided 4 minutes spent commended he stop smoking nicotine patch offered All the  records are reviewed and case discussed with ED provider. Management plans discussed with the patient, family and they are in agreement.  CODE STATUS: Code Status History    Date Active Date Inactive Code Status Order ID Comments User Context   01/26/2018 1840 01/27/2018 1734 Full Code [id number]  Sela Hua, MD Inpatient   06/16/2017 1141 06/18/2017 1433 Full Code [id number]  Florene Glen, MD ED   03/06/2017 1004 03/08/2017 1913 Full Code [id number]  Harrie Foreman, MD Inpatient   05/21/2016 0804 05/24/2016 1751 Full Code [id number]  Saundra Shelling, MD ED       TOTAL TIME TAKING CARE OF THIS PATIENT:37minutes.    Dustin Flock M.D on 09/09/2018 at 10:12 AM  Between 7am to 6pm - Pager - [phone]  After 6pm go to [email or web address] - Proofreader  [employer] Office  [phone]  CC: Primary care physician; [facility] [place]

## 2018-09-10 LAB — CBC
HCT: 39.8 % (ref 39.0–52.0)
Hemoglobin: 13.4 g/dL (ref 13.0–17.0)
MCH: 29.3 pg (ref 26.0–34.0)
MCHC: 33.7 g/dL (ref 30.0–36.0)
MCV: 87.1 fL (ref 80.0–100.0)
Platelets: 207 10*3/uL (ref 150–400)
RBC: 4.57 MIL/uL (ref 4.22–5.81)
RDW: 11.7 % (ref 11.5–15.5)
WBC: 6.3 10*3/uL (ref 4.0–10.5)
nRBC: 0 % (ref 0.0–0.2)

## 2018-09-10 LAB — BASIC METABOLIC PANEL
Anion gap: 9 (ref 5–15)
BUN: 6 mg/dL (ref 6–20)
CO2: 24 mmol/L (ref 22–32)
Calcium: 8.7 mg/dL — ABNORMAL LOW (ref 8.9–10.3)
Chloride: 107 mmol/L (ref 98–111)
Creatinine, Ser: 0.52 mg/dL — ABNORMAL LOW (ref 0.61–1.24)
GFR calc Af Amer: >60 mL/min (ref >60)
GFR calc non Af Amer: >60 mL/min (ref >60)
Glucose, Bld: 145 mg/dL — ABNORMAL HIGH (ref 70–99)
Potassium: 3.3 mmol/L — ABNORMAL LOW (ref 3.5–5.1)
Sodium: 140 mmol/L (ref 135–145)

## 2018-09-10 LAB — GLUCOSE, CAPILLARY
Glucose-Capillary: 146 mg/dL — ABNORMAL HIGH (ref 70–99)
Glucose-Capillary: 178 mg/dL — ABNORMAL HIGH (ref 70–99)
Glucose-Capillary: 81 mg/dL (ref 70–99)

## 2018-09-10 LAB — HIV ANTIBODY (ROUTINE TESTING W REFLEX): HIV Screen 4th Generation wRfx: NONREACTIVE

## 2018-09-10 MED ORDER — POTASSIUM CHLORIDE CRYS ER 20 MEQ PO TBCR
40.0000 meq | EXTENDED_RELEASE_TABLET | Freq: Once | ORAL | Status: AC
  Administered 2018-09-10: 40 meq via ORAL
  Filled 2018-09-10: qty 2

## 2018-09-10 MED ORDER — HYDROCODONE-ACETAMINOPHEN 5-325 MG PO TABS: 1.0000 | ORAL_TABLET | Freq: Four times a day (QID) | ORAL | Status: DC | PRN

## 2018-09-10 MED ORDER — LISINOPRIL 20 MG PO TABS
40.0000 mg | ORAL_TABLET | Freq: Every day | ORAL | Status: DC
  Administered 2018-09-10: 40 mg via ORAL
  Filled 2018-09-10: qty 2

## 2018-09-10 MED ORDER — HYDROCODONE-ACETAMINOPHEN 5-325 MG PO TABS: 1.0000 | ORAL_TABLET | Freq: Two times a day (BID) | ORAL | 0 refills | Status: AC | PRN

## 2018-09-10 MED ORDER — INSULIN ASPART PROT & ASPART (70-30 MIX) 100 UNIT/ML ~~LOC~~ SUSP: 20.0000 [IU] | Freq: Two times a day (BID) | SUBCUTANEOUS | 11 refills | Status: DC

## 2018-09-10 MED ORDER — BLOOD GLUCOSE MONITOR KIT: PACK | 0 refills | Status: DC

## 2018-09-10 MED ORDER — LISINOPRIL 40 MG PO TABS: 40.0000 mg | ORAL_TABLET | Freq: Every day | ORAL | 0 refills | Status: DC

## 2018-09-10 MED ORDER — IOPAMIDOL (ISOVUE-300) INJECTION 61%
15.0000 mL | INTRAVENOUS | Status: DC
  Administered 2018-09-10: 15 mL via ORAL

## 2018-09-10 MED ORDER — LORATADINE 10 MG PO TABS
10.0000 mg | ORAL_TABLET | Freq: Every day | ORAL | Status: DC
  Administered 2018-09-10: 10 mg via ORAL
  Filled 2018-09-10: qty 1

## 2018-09-10 MED ORDER — HYDROCODONE-ACETAMINOPHEN 5-325 MG PO TABS
1.0000 | ORAL_TABLET | Freq: Four times a day (QID) | ORAL | Status: DC | PRN
  Administered 2018-09-10: 1 via ORAL
  Filled 2018-09-10: qty 1

## 2018-09-10 MED ORDER — PANTOPRAZOLE SODIUM 40 MG PO TBEC: 40.0000 mg | DELAYED_RELEASE_TABLET | Freq: Every day | ORAL | 0 refills | Status: DC

## 2018-09-10 NOTE — Progress Notes (Signed)
Inpatient Diabetes Program Recommendations  AACE/ADA: New Consensus Statement on Inpatient Glycemic Control   Target Ranges:  Prepandial:   less than 140 mg/dL      Peak postprandial:   less than 180 mg/dL (1-2 hours)      Critically ill patients:  140 - 180 mg/dL  Results for Jonathan Mcmillan, Jonathan Mcmillan (MRN [id number]) as of 09/10/2018 13:30  Ref. Range 09/09/2018 08:25 09/09/2018 13:01 09/09/2018 16:38 09/09/2018 21:18 09/10/2018 00:08 09/10/2018 07:56 09/10/2018 11:36  Glucose-Capillary Latest Ref Range: 70 - 99 mg/dL 416 (H) 258 (H) 91 173 (H) 146 (H) 178 (H) 81  Results for Jonathan Mcmillan (MRN [id number]) as of 09/10/2018 13:30  Ref. Range 09/09/2018 14:31  Hemoglobin A1C Latest Ref Range: 4.8 - 5.6 % 13.3 (H)   Results for Jonathan Mcmillan (MRN [id number]) as of 09/10/2018 13:30  Ref. Range 09/09/2018 08:44  Beta-Hydroxybutyric Acid Latest Ref Range: 0.05 - 0.27 mmol/L 0.86 (H)  Glucose Latest Ref Range: 70 - 99 mg/dL 479 (H)   Review of Glycemic Control  Outpatient Diabetes medications: 70/30 14 units BID Current orders for Inpatient glycemic control: Lantus 30 units QHS, Novolog 0-15 units TID with meals, Novolog 0-5 units QHS, Novolog 4 units TID with meals  Inpatient Diabetes Program Recommendations:   Outpatient DM regimen: May want to consider increasing 70/30 to 20 units BID (will provide 28 units for basal and 12 units for meal coverage per day). Strongly encouraged patient to call [facility] for follow up, to check glucose 3-4 times per day, and to take insulin as prescribed.    HgbA1C: A1C 13.3% on 09/09/18 indicating an average glucose of 335 mg/dl over the past 2-3 months.  NOTE: Noted patient will be discharged today. Spoke with patient over the phone about diabetes and home regimen for diabetes control. Patient reports that he is followed by [facility] for diabetes management. Informed patient that per CM note on 09/09/18, he has not been seen at [facility] in over 1 year.  Patient states that [facility] and that he is taking 70/30 14 units BID. Patient states that he is not checking glucose at all because he does not have a glucometer. Will as CM to see if they have a Reli-On glucometer to give patient. Informed patient that if CM has a glucometer they may be able to provide him with one. However, I informed patient that he could purchase a Reli-On Premier Classic glucometer for $9 and a box of 50 test strips for $9 at [employer]. Patient reports that he is taking 70/30 14 units BID consistently without checking glucose. Patient reports that he is still drinking sugary beverages and not following a carb modified diet.  Discussed A1C results (13.3% on 09/09/18) and explained that his current A1C indicates an average glucose of 335 mg/dl over the past 2-3 months. Discussed glucose and A1C goals. Discussed importance of checking CBGs and maintaining good CBG control to prevent long-term and short-term complications. Explained how hyperglycemia leads to damage within blood vessels which lead to the common complications seen with uncontrolled diabetes. Stressed to the patient the importance of improving glycemic control to prevent further complications from uncontrolled diabetes. Discussed impact of nutrition, exercise, stress, sickness, and medications on diabetes control. Patient states that he helps take care of his grandmother. Explained that he needs to take care of himself so he is able to continue to help his grandmother. Encouraged patient to make dietary changes, to  check his glucose 3-4 times per day (before meals and at bedtime), to take insulin as prescribed, and to call [facility] as soon as possible to make a follow up appointment. Patient verbalized understanding of information discussed and he states that he has no further questions at this time related to diabetes.  Thanks, Barnie Alderman, RN, MSN, CDE Diabetes  Coordinator Inpatient Diabetes Program [phone] (Team Pager)

## 2018-09-10 NOTE — Progress Notes (Signed)
PT Stable. Instructions and RX's discussed with verbalized understanding

## 2018-09-10 NOTE — Care Management (Signed)
Patient discharged before TOC could assess. DM coordinator notified TOC that he would need glucometer however this was never delivered to patient.  I have sent message to [facility] at medication management and [facility] to please follow up with patient.

## 2018-09-10 NOTE — Discharge Instructions (Signed)
Soft bland diet for 2 3 days.  Advance to diabetic diet after this.  Please return if symptoms worsen in any way.

## 2018-09-13 NOTE — Discharge Summary (Signed)
[facility] NAME: Jonathan Mcmillan    MR#:  [id number]  DATE OF BIRTH:  09-23-1995  DATE OF ADMISSION:  09/09/2018 ADMITTING PHYSICIAN: Dustin Flock, MD  DATE OF DISCHARGE: 09/10/2018  2:05 PM  PRIMARY CARE PHYSICIAN: [facility] [place]   ADMISSION DIAGNOSIS:  Generalized abdominal pain [R10.84] Hyperglycemia [R73.9] Intractable vomiting with nausea, unspecified vomiting type [R11.2]  DISCHARGE DIAGNOSIS:  Active Problems:   Abdominal pain   SECONDARY DIAGNOSIS:   Past Medical History:  Diagnosis Date  . Diabetes 1.5, managed as type 1 ([place])   . Perirectal abscess 06/16/2017     ADMITTING HISTORY  HISTORY OF PRESENT ILLNESS: Jonathan Mcmillan  is a 23 y.o. male with a known history of  Diabetes on insulin therapy, perirectal abscess and nicotine abuse who presents to the emergency room with complaint of having nausea vomiting and diarrhea for the past week.  Also states that he has been having upper abdominal pain radiating into the chest.  Denies any fevers complains of some chills.  Denies any urinary symptoms.  Evaluation in the ER shows patient to be dehydrated and he is noted to have blood sugars which are very high.  HOSPITAL COURSE:   Patient is 23 year old with history of diabetes presents with nausea and vomiting and abdominal pain  1.  Abdominal pain nausea vomiting likely due to gastroparesis Treated with IV Zofran, Reglan and pain medications Patient continued to have pain and a CT scan of the abdomen and pelvis was ordered.  But patient refused this test.  He wanted to be discharged home. I do not see any infection or bleeding or acute abdomen.  Discussed with patient return if any worsening of symptoms.  Treat pain symptomatically at this time. Discharged home to follow-up with primary care physician.  2.  Diabetes type 1 , uncontrolled with hyperglycemia.  Patient on 70/30 insulin.  Dose  increased at discharge.  Improved blood sugars.  3.  Accelerated hypertension  Started on lisinopril and prescription given at discharge  4.  Nicotine abuse smoking cessation provided on admission  Stable for discharge home  CONSULTS OBTAINED:    DRUG ALLERGIES:  No Known Allergies  DISCHARGE MEDICATIONS:   Allergies as of 09/10/2018   No Known Allergies     Medication List    TAKE these medications   blood glucose meter kit and supplies Kit Dispense based on patient and insurance preference. Use up to four times daily as directed. (FOR ICD-9 250.00, 250.01).   HYDROcodone-acetaminophen 5-325 MG tablet Commonly known as:  NORCO/VICODIN Take 1 tablet by mouth 2 (two) times daily as needed for up to 3 days for severe pain.   insulin aspart protamine- aspart (70-30) 100 UNIT/ML injection Commonly known as:  NOVOLOG MIX 70/30 Inject 0.2 mLs (20 Units total) into the skin 2 (two) times daily with a meal. What changed:  how much to take   insulin glargine 100 UNIT/ML injection Commonly known as:  LANTUS Inject 30 Units into the skin at bedtime.   lisinopril 40 MG tablet Commonly known as:  ZESTRIL Take 1 tablet (40 mg total) by mouth daily.   pantoprazole 40 MG tablet Commonly known as:  Protonix Take 1 tablet (40 mg total) by mouth daily.       Today   VITAL SIGNS:  Blood pressure (!) 159/101, pulse (!) 108, temperature 97.9 F (36.6 C), temperature source Oral, resp. rate 16, height 5' 8"  (1.727  m), weight 62.6 kg, SpO2 100 %.  I/O:  No intake or output data in the 24 hours ending 09/13/18 1026  PHYSICAL EXAMINATION:  Physical Exam  GENERAL:  23 y.o.-year-old patient lying in the bed with no acute distress.  LUNGS: Normal breath sounds bilaterally, no wheezing, rales,rhonchi or crepitation. No use of accessory muscles of respiration.  CARDIOVASCULAR: S1, S2 normal. No murmurs, rubs, or gallops.  ABDOMEN: Soft, non-tender, non-distended. Bowel sounds  present. No organomegaly or mass.  NEUROLOGIC: Moves all 4 extremities. PSYCHIATRIC: The patient is alert and oriented x 3.  SKIN: No obvious rash, lesion, or ulcer.   DATA REVIEW:   CBC Recent Labs  Lab 09/10/18 0437  WBC 6.3  HGB 13.4  HCT 39.8  PLT 207    Chemistries  Recent Labs  Lab 09/09/18 0844 09/10/18 0437  NA 134* 140  K 4.4 3.3*  CL 97* 107  CO2 28 24  GLUCOSE 479* 145*  BUN 10 6  CREATININE 0.82 0.52*  CALCIUM 9.4 8.7*  AST 18  --   ALT 21  --   ALKPHOS 141*  --   BILITOT 1.4*  --     Cardiac Enzymes No results for input(s): TROPONINI in the last 168 hours.  Microbiology Results  Results for orders placed or performed during the hospital encounter of 01/26/18  Blood Culture (routine x 2)     Status: None   Collection Time: 01/26/18  4:21 PM  Result Value Ref Range Status   Specimen Description BLOOD L HAND  Final   Special Requests   Final    BOTTLES DRAWN AEROBIC AND ANAEROBIC Blood Culture results may not be optimal due to an excessive volume of blood received in culture bottles   Culture   Final    NO GROWTH 5 DAYS Performed at [facility], [address] [place], [place] [address]    Report Status 01/31/2018 FINAL  Final  Blood Culture (routine x 2)     Status: None   Collection Time: 01/26/18  4:22 PM  Result Value Ref Range Status   Specimen Description BLOOD LFA  Final   Special Requests   Final    BOTTLES DRAWN AEROBIC AND ANAEROBIC Blood Culture results may not be optimal due to an excessive volume of blood received in culture bottles   Culture   Final    NO GROWTH 5 DAYS Performed at [facility], [address], [place], [place] [address]    Report Status 01/31/2018 FINAL  Final    RADIOLOGY:  No results found.  Follow up with PCP in 1 week.  Management plans discussed with the patient, family and they are in agreement.  CODE STATUS:  Code Status History    Date Active Date Inactive Code Status  Order ID Comments User Context   09/09/2018 1215 09/10/2018 1711 Full Code [id number]  Dustin Flock, MD Inpatient   01/26/2018 1840 01/27/2018 1734 Full Code [id number]  Sela Hua, MD Inpatient   06/16/2017 1141 06/18/2017 1433 Full Code [id number]  Florene Glen, MD ED   03/06/2017 1004 03/08/2017 1913 Full Code [id number]  Harrie Foreman, MD Inpatient   05/21/2016 0804 05/24/2016 1751 Full Code [id number]  Saundra Shelling, MD ED      TOTAL TIME TAKING CARE OF THIS PATIENT ON DAY OF DISCHARGE: more than 30 minutes.   Neita Carp M.D on 09/13/2018 at 10:26 AM  Between 7am to 6pm - Pager - [phone]  After  6pm go to [email or web address] - password [email or web address] [facility]  Office  [phone]  CC: Primary care physician; [facility] [place]  Note: This dictation was prepared with Diplomatic Services operational officer dictation along with smaller phrase technology. Any transcriptional errors that result from this process are unintentional.

## 2018-09-22 ENCOUNTER — Emergency Department

## 2018-09-22 ENCOUNTER — Other Ambulatory Visit: Payer: Self-pay

## 2018-09-22 ENCOUNTER — Encounter: Payer: Self-pay | Admitting: Emergency Medicine

## 2018-09-22 ENCOUNTER — Emergency Department
Admission: EM | Admit: 2018-09-22 | Discharge: 2018-09-22 | Attending: Emergency Medicine | Admitting: Emergency Medicine

## 2018-09-22 DIAGNOSIS — Y929 Unspecified place or not applicable: Secondary | ICD-10-CM | POA: Insufficient documentation

## 2018-09-22 DIAGNOSIS — Z794 Long term (current) use of insulin: Secondary | ICD-10-CM | POA: Insufficient documentation

## 2018-09-22 DIAGNOSIS — Y939 Activity, unspecified: Secondary | ICD-10-CM | POA: Insufficient documentation

## 2018-09-22 DIAGNOSIS — Z79899 Other long term (current) drug therapy: Secondary | ICD-10-CM | POA: Insufficient documentation

## 2018-09-22 DIAGNOSIS — F17210 Nicotine dependence, cigarettes, uncomplicated: Secondary | ICD-10-CM | POA: Insufficient documentation

## 2018-09-22 DIAGNOSIS — S82124A Nondisplaced fracture of lateral condyle of right tibia, initial encounter for closed fracture: Secondary | ICD-10-CM | POA: Insufficient documentation

## 2018-09-22 DIAGNOSIS — Y999 Unspecified external cause status: Secondary | ICD-10-CM | POA: Insufficient documentation

## 2018-09-22 DIAGNOSIS — E109 Type 1 diabetes mellitus without complications: Secondary | ICD-10-CM | POA: Insufficient documentation

## 2018-09-22 DIAGNOSIS — I10 Essential (primary) hypertension: Secondary | ICD-10-CM | POA: Insufficient documentation

## 2018-09-22 DIAGNOSIS — S82141A Displaced bicondylar fracture of right tibia, initial encounter for closed fracture: Secondary | ICD-10-CM

## 2018-09-22 MED ORDER — NAPROXEN 500 MG PO TABS: 500.0000 mg | ORAL_TABLET | Freq: Two times a day (BID) | ORAL | 2 refills | Status: DC
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
## 2018-09-22 NOTE — Discharge Instructions (Signed)
Please do not bear weight on your right let while this is healing

## 2018-09-22 NOTE — ED Notes (Signed)
Patient's discharge and follow up information reviewed with patient by ED nursing staff and patient given the opportunity to ask questions pertaining to ED visit and discharge plan of care. Patient advised that should symptoms not continue to improve, resolve entirely, or should new symptoms develop then a follow up visit with their PCP or a return visit to the ED may be warranted. Patient verbalized consent and understanding of discharge plan of care including potential need for further evaluation. Patient discharged in stable condition per attending ED physician on duty.   Pt leaving in custody of [employer]. Pt assisted into police vehicle and help provided to elevate leg appropriately in police vehicle with knee immobilizer in place.

## 2018-09-22 NOTE — ED Notes (Signed)
X-ray at bedside

## 2018-09-24 ENCOUNTER — Telehealth: Payer: Self-pay | Admitting: Emergency Medicine

## 2018-09-24 MED ORDER — NAPROXEN 500 MG PO TABS: 500.0000 mg | ORAL_TABLET | Freq: Two times a day (BID) | ORAL | 2 refills | Status: DC

## 2018-10-26 ENCOUNTER — Other Ambulatory Visit: Payer: Self-pay

## 2018-10-26 ENCOUNTER — Emergency Department
Admission: EM | Admit: 2018-10-26 | Discharge: 2018-10-26 | Disposition: A | Discharge status: Home / Self Care | Payer: Self-pay | Attending: Emergency Medicine | Admitting: Emergency Medicine

## 2018-10-26 ENCOUNTER — Emergency Department: Payer: Self-pay

## 2018-10-26 DIAGNOSIS — Z79899 Other long term (current) drug therapy: Secondary | ICD-10-CM | POA: Insufficient documentation

## 2018-10-26 DIAGNOSIS — Z20828 Contact with and (suspected) exposure to other viral communicable diseases: Secondary | ICD-10-CM | POA: Insufficient documentation

## 2018-10-26 DIAGNOSIS — R739 Hyperglycemia, unspecified: Secondary | ICD-10-CM

## 2018-10-26 DIAGNOSIS — I10 Essential (primary) hypertension: Secondary | ICD-10-CM | POA: Insufficient documentation

## 2018-10-26 DIAGNOSIS — R109 Unspecified abdominal pain: Secondary | ICD-10-CM

## 2018-10-26 DIAGNOSIS — F17210 Nicotine dependence, cigarettes, uncomplicated: Secondary | ICD-10-CM | POA: Insufficient documentation

## 2018-10-26 DIAGNOSIS — E1065 Type 1 diabetes mellitus with hyperglycemia: Secondary | ICD-10-CM | POA: Insufficient documentation

## 2018-10-26 DIAGNOSIS — R112 Nausea with vomiting, unspecified: Secondary | ICD-10-CM

## 2018-10-26 LAB — LIPASE, BLOOD: Lipase: 25 U/L (ref 11–51)

## 2018-10-26 LAB — CBC
HCT: 41.8 % (ref 39.0–52.0)
Hemoglobin: 13.9 g/dL (ref 13.0–17.0)
MCH: 29.1 pg (ref 26.0–34.0)
MCHC: 33.3 g/dL (ref 30.0–36.0)
MCV: 87.6 fL (ref 80.0–100.0)
Platelets: 260 10*3/uL (ref 150–400)
RBC: 4.77 MIL/uL (ref 4.22–5.81)
RDW: 11.9 % (ref 11.5–15.5)
WBC: 5.5 10*3/uL (ref 4.0–10.5)
nRBC: 0 % (ref 0.0–0.2)

## 2018-10-26 LAB — URINALYSIS, COMPLETE (UACMP) WITH MICROSCOPIC
Bacteria, UA: NONE SEEN
Bilirubin Urine: NEGATIVE
Glucose, UA: >=500 mg/dL — AB
Ketones, ur: 20 mg/dL — AB
Leukocytes,Ua: NEGATIVE
Nitrite: NEGATIVE
Protein, ur: 100 mg/dL — AB
Specific Gravity, Urine: 1.022 (ref 1.005–1.030)
pH: 7 (ref 5.0–8.0)

## 2018-10-26 LAB — COMPREHENSIVE METABOLIC PANEL
ALT: 16 U/L (ref 0–44)
AST: 14 U/L — ABNORMAL LOW (ref 15–41)
Albumin: 4.2 g/dL (ref 3.5–5.0)
Alkaline Phosphatase: 137 U/L — ABNORMAL HIGH (ref 38–126)
Anion gap: 15 (ref 5–15)
BUN: 13 mg/dL (ref 6–20)
CO2: 26 mmol/L (ref 22–32)
Calcium: 9.4 mg/dL (ref 8.9–10.3)
Chloride: 97 mmol/L — ABNORMAL LOW (ref 98–111)
Creatinine, Ser: 1.11 mg/dL (ref 0.61–1.24)
GFR calc Af Amer: >60 mL/min (ref >60)
GFR calc non Af Amer: >60 mL/min (ref >60)
Glucose, Bld: 329 mg/dL — ABNORMAL HIGH (ref 70–99)
Potassium: 3.9 mmol/L (ref 3.5–5.1)
Sodium: 138 mmol/L (ref 135–145)
Total Bilirubin: 1.2 mg/dL (ref 0.3–1.2)
Total Protein: 7.6 g/dL (ref 6.5–8.1)

## 2018-10-26 LAB — BLOOD GAS, VENOUS
Acid-Base Excess: 4.9 mmol/L — ABNORMAL HIGH (ref 0.0–2.0)
Bicarbonate: 31.4 mmol/L — ABNORMAL HIGH (ref 20.0–28.0)
O2 Saturation: 78.7 %
Patient temperature: 37
pCO2, Ven: 53 mmHg (ref 44.0–60.0)
pH, Ven: 7.38 (ref 7.250–7.430)
pO2, Ven: 44 mmHg (ref 32.0–45.0)

## 2018-10-26 LAB — BETA-HYDROXYBUTYRIC ACID: Beta-Hydroxybutyric Acid: 2.14 mmol/L — ABNORMAL HIGH (ref 0.05–0.27)

## 2018-10-26 LAB — GLUCOSE, CAPILLARY: Glucose-Capillary: 311 mg/dL — ABNORMAL HIGH (ref 70–99)

## 2018-10-26 LAB — SARS CORONAVIRUS 2 BY RT PCR (HOSPITAL ORDER, PERFORMED IN ~~LOC~~ HOSPITAL LAB): SARS Coronavirus 2: NEGATIVE

## 2018-10-26 MED ORDER — SODIUM CHLORIDE 0.9 % IV BOLUS
1000.0000 mL | Freq: Once | INTRAVENOUS | Status: AC
  Administered 2018-10-26: 1000 mL via INTRAVENOUS

## 2018-10-26 MED ORDER — ONDANSETRON 4 MG PO TBDP: 4.0000 mg | ORAL_TABLET | Freq: Three times a day (TID) | ORAL | 0 refills | Status: DC | PRN

## 2018-10-26 MED ORDER — LISINOPRIL 10 MG PO TABS
40.0000 mg | ORAL_TABLET | Freq: Once | ORAL | Status: AC
  Administered 2018-10-26: 08:00:00 40 mg via ORAL
  Filled 2018-10-26: qty 4

## 2018-10-26 MED ORDER — SODIUM CHLORIDE 0.9% FLUSH
3.0000 mL | Freq: Once | INTRAVENOUS | Status: AC
  Administered 2018-10-26: 02:00:00 3 mL via INTRAVENOUS

## 2018-10-26 MED ORDER — ONDANSETRON HCL 4 MG/2ML IJ SOLN
4.0000 mg | Freq: Once | INTRAMUSCULAR | Status: AC
  Administered 2018-10-26: 4 mg via INTRAVENOUS
  Filled 2018-10-26: qty 2

## 2018-10-26 MED ORDER — FAMOTIDINE IN NACL 20-0.9 MG/50ML-% IV SOLN
20.0000 mg | Freq: Once | INTRAVENOUS | Status: AC
  Administered 2018-10-26: 04:00:00 20 mg via INTRAVENOUS
  Filled 2018-10-26: qty 50

## 2018-10-26 NOTE — ED Triage Notes (Signed)
Patient c/o abdominal pain, nausea, and body aches X 2 days.

## 2018-10-26 NOTE — ED Provider Notes (Signed)
 [facility] Emergency Department Provider Note   ____________________________________________   First MD Initiated Contact with Patient 10/26/18 0300     (approximate)  I have reviewed the triage vital signs and the nursing notes.   HISTORY  Chief Complaint Abdominal Pain and Generalized Body Aches    HPI Jonathan Mcmillan is a 23 y.o. male who presents to the ED from home with a chief complaint of abdominal pain, nausea/vomiting and body aches.  Patient has a history of type 1 diabetes who presents with the above symptoms x2 days.  Denies fever, cough, chest pain, shortness of breath, diarrhea.  Denies recent travel, trauma or exposure to persons diagnosed with coronavirus.       Past Medical History:  Diagnosis Date  . 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 Perirectal abscess 06/16/2017    Patient Active Problem List   Diagnosis Date Noted  . Abdominal pain 09/09/2018  . Right arm cellulitis 01/26/2018  . Sepsis ([facility]) 03/06/2017  . Malnutrition of moderate degree 05/22/2016  . DKA (diabetic ketoacidoses) ([facility]) 05/21/2016  . Type I (juvenile type) diabetes mellitus without mention of complication, uncontrolled 10/05/2010  . Hypertension 10/05/2010  . Obesity 10/05/2010    Past Surgical History:  Procedure Laterality Date  . INCISION AND DRAINAGE PERIRECTAL ABSCESS N/A 06/16/2017   Procedure: IRRIGATION AND DEBRIDEMENT PERIRECTAL ABSCESS;  Surgeon: Cooper, Richard E, MD;  Location: [facility] ORS;  Service: General;  Laterality: N/A;  . none      Prior to Admission medications   Medication Sig Start Date End Date Taking? Authorizing Provider  blood glucose meter kit and supplies KIT Dispense based on patient and insurance preference. Use up to four times daily as directed. (FOR ICD-9 250.00, 250.01). 09/10/18   Sudini, Srikar, MD  insulin aspart protamine- aspart (NOVOLOG MIX 70/30) (70-30) 100 UNIT/ML injection Inject 0.2 mLs (20 Units total) into  the skin 2 (two) times daily with a meal. 09/10/18   Sudini, Srikar, MD  insulin glargine (LANTUS) 100 UNIT/ML injection Inject 30 Units into the skin at bedtime.    [provider]  lisinopril (ZESTRIL) 40 MG tablet Take 1 tablet (40 mg total) by mouth daily. 09/11/18   Sudini, Srikar, MD  naproxen (NAPROSYN) 500 MG tablet Take 1 tablet (500 mg total) by mouth 2 (two) times daily with a meal. 09/22/18   Kinner, Robert, MD  naproxen (NAPROSYN) 500 MG tablet Take 1 tablet (500 mg total) by mouth 2 (two) times daily with a meal. 09/24/18   Kinner, Robert, MD  pantoprazole (PROTONIX) 40 MG tablet Take 1 tablet (40 mg total) by mouth daily. 09/10/18   Sudini, Srikar, MD    Allergies Patient has no known allergies.  Family History  Problem Relation Age of Onset  . Diabetes Mother     Social History Social History   Tobacco Use  . Smoking status: Current Every Day Smoker    Packs/day: 0.50    Types: Cigarettes  . Smokeless tobacco: Never Used  Substance Use Topics  . Alcohol use: No  . Drug use: Yes    Types: Marijuana    Review of Systems  Constitutional: No fever/chills Eyes: No visual changes. ENT: No sore throat. Cardiovascular: Denies chest pain. Respiratory: Positive for cough.  Denies shortness of breath. Gastrointestinal: Positive for abdominal pain, nausea and vomiting.  No diarrhea.  No constipation. Genitourinary: Negative for dysuria. Musculoskeletal: Negative for back pain. Skin: Negative for rash. Neurological: Negative for   headaches, focal weakness or numbness.   ____________________________________________   PHYSICAL EXAM:  VITAL SIGNS: ED Triage Vitals  Enc Vitals Group     BP 10/26/18 0143 (!) 149/92     Pulse Rate 10/26/18 0143 (!) 128     Resp 10/26/18 0143 19     Temp 10/26/18 0143 98 F (36.7 C)     Temp src --      SpO2 10/26/18 0143 99 %     Weight 10/26/18 0142 145 lb (65.8 kg)     Height 10/26/18 0142 5' 8" (1.727 m)     Head  Circumference --      Peak Flow --      Pain Score 10/26/18 0142 10     Pain Loc --      Pain Edu? --      Excl. in [place]? --     Constitutional: Sleeping, awakened for exam.  Alert and oriented. Well appearing and in no acute distress. Eyes: Conjunctivae are normal. PERRL. EOMI. Head: Atraumatic. Nose: No congestion/rhinnorhea. Mouth/Throat: Mucous membranes are moist.  Oropharynx non-erythematous. Neck: No stridor.   Cardiovascular: Tachycardic rate, regular rhythm. Grossly normal heart sounds.  Good peripheral circulation. Respiratory: Normal respiratory effort.  No retractions. Lungs CTAB. Gastrointestinal: Soft and nontender to light or deep palpation. No distention. No abdominal bruits. No CVA tenderness. Musculoskeletal: No lower extremity tenderness nor edema.  No joint effusions. Neurologic:  Normal speech and language. No gross focal neurologic deficits are appreciated. No gait instability. Skin:  Skin is warm, dry and intact. No rash noted. Psychiatric: Mood and affect are normal. Speech and behavior are normal.  ____________________________________________   LABS (all labs ordered are listed, but only abnormal results are displayed)  Labs Reviewed  COMPREHENSIVE METABOLIC PANEL - Abnormal; Notable for the following components:      Result Value   Chloride 97 (*)    Glucose, Bld 329 (*)    AST 14 (*)    Alkaline Phosphatase 137 (*)    All other components within normal limits  URINALYSIS, COMPLETE (UACMP) WITH MICROSCOPIC - Abnormal; Notable for the following components:   Color, Urine YELLOW (*)    APPearance CLEAR (*)    Glucose, UA >=500 (*)    Hgb urine dipstick MODERATE (*)    Ketones, ur 20 (*)    Protein, ur 100 (*)    All other components within normal limits  GLUCOSE, CAPILLARY - Abnormal; Notable for the following components:   Glucose-Capillary 311 (*)    All other components within normal limits  BETA-HYDROXYBUTYRIC ACID - Abnormal; Notable for the  following components:   Beta-Hydroxybutyric Acid 2.14 (*)    All other components within normal limits  BLOOD GAS, VENOUS - Abnormal; Notable for the following components:   Bicarbonate 31.4 (*)    Acid-Base Excess 4.9 (*)    All other components within normal limits  SARS CORONAVIRUS 2 (HOSPITAL ORDER, [place] [facility])  LIPASE, BLOOD  CBC  CBG MONITORING, ED   ____________________________________________  EKG  ED ECG REPORT I, , J, the attending physician, personally viewed and interpreted this ECG.   Date: 10/26/2018  EKG Time: 0411  Rate: 112  Rhythm: sinus tachycardia  Axis: Normal  Intervals:none  ST&T Change: Nonspecific  ____________________________________________  RADIOLOGY  ED MD interpretation: No acute cardiopulmonary process  Official radiology report(s): Dg Chest Port 1 View  Result Date: 10/26/2018 CLINICAL DATA:  Dyspnea.  Nausea and vomiting.  Abdominal pain. EXAM:  PORTABLE CHEST 1 VIEW COMPARISON:  09/09/2018 FINDINGS: The heart size and mediastinal contours are within normal limits. Both lungs are clear. The visualized skeletal structures are unremarkable. IMPRESSION: No active disease. Electronically Signed   By: Earle Gell M.D.   On: 10/26/2018 04:09    ____________________________________________   PROCEDURES  Procedure(s) performed (including Critical Care):  Procedures   ____________________________________________   INITIAL IMPRESSION / ASSESSMENT AND PLAN / ED COURSE  As part of my medical decision making, I reviewed the following data within the [facility] notes reviewed and incorporated, Labs reviewed, EKG interpreted, Old chart reviewed, Radiograph reviewed and Notes from prior ED visits     Jonathan Mcmillan was evaluated in Emergency Department on 10/26/2018 for the symptoms described in the history of present illness. He was evaluated in the context of the global COVID-19  pandemic, which necessitated consideration that the patient might be at risk for infection with the SARS-CoV-2 virus that causes COVID-19. Institutional protocols and algorithms that pertain to the evaluation of patients at risk for COVID-19 are in a state of rapid change based on information released by regulatory bodies including the CDC and federal and state organizations. These policies and algorithms were followed during the patient's care in the ED.   23 year old male with type 1 diabetes who presents with a 2-day history of abdominal pain, nausea/vomiting and body aches.  Differential diagnosis includes but is not limited to DKA, gastroenteritis, COVID-19, UTI, etc.  Patient received Zofran prior to being roomed and states nausea is already better.  Will infuse IV fluids, obtain chest x-ray, UA and cope with swab.  Will reassess.  Clinical Course as of Oct 25 625  Sun Oct 26, 2018  0615 Patient resting in no acute distress.  Feeling significantly better.  Awaiting urine results.   [JS]  [id number] Patient tolerated PO and emesis.  Strict return precautions given.  Patient verbalizes understanding and agrees with plan of care.   [JS]    Clinical Course User Index [JS] Paulette Blanch, MD     ____________________________________________   FINAL CLINICAL IMPRESSION(S) / ED DIAGNOSES  Final diagnoses:  Hyperglycemia  Abdominal pain, unspecified abdominal location  Non-intractable vomiting with nausea, unspecified vomiting type     ED Discharge Orders    None       Note:  This document was prepared using Dragon voice recognition software and may include unintentional dictation errors.   Paulette Blanch, MD 10/26/18 [phone]

## 2018-10-26 NOTE — Discharge Instructions (Addendum)
1.  You may take Zofran as needed for nausea/vomiting. 2.  Take your medicines as directed by your doctor. 3.  Clear liquids x12 hours, then bland diet x3 days, then slowly advance diet as tolerated. 4.  Return to the ER for worsening symptoms, persistent vomiting, difficulty breathing or other concerns.

## 2018-12-10 ENCOUNTER — Other Ambulatory Visit: Payer: Self-pay

## 2018-12-10 ENCOUNTER — Encounter: Payer: Self-pay | Admitting: Emergency Medicine

## 2018-12-10 ENCOUNTER — Emergency Department: Payer: Self-pay

## 2018-12-10 ENCOUNTER — Emergency Department
Admission: EM | Admit: 2018-12-10 | Discharge: 2018-12-11 | Disposition: A | Discharge status: Home / Self Care | Payer: Self-pay | Attending: Emergency Medicine | Admitting: Emergency Medicine

## 2018-12-10 DIAGNOSIS — Z20828 Contact with and (suspected) exposure to other viral communicable diseases: Secondary | ICD-10-CM | POA: Insufficient documentation

## 2018-12-10 DIAGNOSIS — F17210 Nicotine dependence, cigarettes, uncomplicated: Secondary | ICD-10-CM | POA: Insufficient documentation

## 2018-12-10 DIAGNOSIS — R739 Hyperglycemia, unspecified: Secondary | ICD-10-CM

## 2018-12-10 DIAGNOSIS — Z79899 Other long term (current) drug therapy: Secondary | ICD-10-CM | POA: Insufficient documentation

## 2018-12-10 DIAGNOSIS — N179 Acute kidney failure, unspecified: Secondary | ICD-10-CM | POA: Insufficient documentation

## 2018-12-10 DIAGNOSIS — R079 Chest pain, unspecified: Secondary | ICD-10-CM | POA: Insufficient documentation

## 2018-12-10 DIAGNOSIS — Z794 Long term (current) use of insulin: Secondary | ICD-10-CM | POA: Insufficient documentation

## 2018-12-10 DIAGNOSIS — E1065 Type 1 diabetes mellitus with hyperglycemia: Secondary | ICD-10-CM | POA: Insufficient documentation

## 2018-12-10 DIAGNOSIS — R42 Dizziness and giddiness: Secondary | ICD-10-CM | POA: Insufficient documentation

## 2018-12-10 DIAGNOSIS — F1410 Cocaine abuse, uncomplicated: Secondary | ICD-10-CM | POA: Insufficient documentation

## 2018-12-10 LAB — URINALYSIS, COMPLETE (UACMP) WITH MICROSCOPIC
Bacteria, UA: NONE SEEN
Bilirubin Urine: NEGATIVE
Glucose, UA: 150 mg/dL — AB
Ketones, ur: NEGATIVE mg/dL
Leukocytes,Ua: NEGATIVE
Nitrite: NEGATIVE
Protein, ur: NEGATIVE mg/dL
Specific Gravity, Urine: 1.013 (ref 1.005–1.030)
pH: 5 (ref 5.0–8.0)

## 2018-12-10 LAB — BASIC METABOLIC PANEL
Anion gap: 9 (ref 5–15)
Anion gap: 7 (ref 5–15)
BUN: 46 mg/dL — ABNORMAL HIGH (ref 6–20)
BUN: 42 mg/dL — ABNORMAL HIGH (ref 6–20)
CO2: 27 mmol/L (ref 22–32)
CO2: 25 mmol/L (ref 22–32)
Calcium: 9.1 mg/dL (ref 8.9–10.3)
Calcium: 8 mg/dL — ABNORMAL LOW (ref 8.9–10.3)
Chloride: 98 mmol/L (ref 98–111)
Chloride: 105 mmol/L (ref 98–111)
Creatinine, Ser: 2.06 mg/dL — ABNORMAL HIGH (ref 0.61–1.24)
Creatinine, Ser: 1.71 mg/dL — ABNORMAL HIGH (ref 0.61–1.24)
GFR calc Af Amer: 50 mL/min — ABNORMAL LOW (ref >60)
GFR calc Af Amer: >60 mL/min (ref >60)
GFR calc non Af Amer: 43 mL/min — ABNORMAL LOW (ref >60)
GFR calc non Af Amer: 54 mL/min — ABNORMAL LOW (ref >60)
Glucose, Bld: 225 mg/dL — ABNORMAL HIGH (ref 70–99)
Glucose, Bld: 55 mg/dL — ABNORMAL LOW (ref 70–99)
Potassium: 4.3 mmol/L (ref 3.5–5.1)
Potassium: 3.7 mmol/L (ref 3.5–5.1)
Sodium: 134 mmol/L — ABNORMAL LOW (ref 135–145)
Sodium: 137 mmol/L (ref 135–145)

## 2018-12-10 LAB — GLUCOSE, CAPILLARY
Glucose-Capillary: 206 mg/dL — ABNORMAL HIGH (ref 70–99)
Glucose-Capillary: 102 mg/dL — ABNORMAL HIGH (ref 70–99)

## 2018-12-10 LAB — CBC
HCT: 35.9 % — ABNORMAL LOW (ref 39.0–52.0)
Hemoglobin: 12.2 g/dL — ABNORMAL LOW (ref 13.0–17.0)
MCH: 29.5 pg (ref 26.0–34.0)
MCHC: 34 g/dL (ref 30.0–36.0)
MCV: 86.9 fL (ref 80.0–100.0)
Platelets: 260 10*3/uL (ref 150–400)
RBC: 4.13 MIL/uL — ABNORMAL LOW (ref 4.22–5.81)
RDW: 11.9 % (ref 11.5–15.5)
WBC: 6 10*3/uL (ref 4.0–10.5)
nRBC: 0 % (ref 0.0–0.2)

## 2018-12-10 LAB — HEPATIC FUNCTION PANEL
ALT: 16 U/L (ref 0–44)
AST: 15 U/L (ref 15–41)
Albumin: 3.9 g/dL (ref 3.5–5.0)
Alkaline Phosphatase: 90 U/L (ref 38–126)
Bilirubin, Direct: <0.1 mg/dL (ref 0.0–0.2)
Total Bilirubin: 0.7 mg/dL (ref 0.3–1.2)
Total Protein: 7 g/dL (ref 6.5–8.1)

## 2018-12-10 LAB — URINE DRUG SCREEN, QUALITATIVE (ARMC ONLY)
Amphetamines, Ur Screen: NOT DETECTED
Barbiturates, Ur Screen: NOT DETECTED
Benzodiazepine, Ur Scrn: NOT DETECTED
Cannabinoid 50 Ng, Ur ~~LOC~~: POSITIVE — AB
Cocaine Metabolite,Ur ~~LOC~~: POSITIVE — AB
MDMA (Ecstasy)Ur Screen: NOT DETECTED
Methadone Scn, Ur: NOT DETECTED
Opiate, Ur Screen: NOT DETECTED
Phencyclidine (PCP) Ur S: NOT DETECTED
Tricyclic, Ur Screen: NOT DETECTED

## 2018-12-10 LAB — TROPONIN I (HIGH SENSITIVITY): Troponin I (High Sensitivity): <2 ng/L (ref <18)

## 2018-12-10 LAB — CK: Total CK: 132 U/L (ref 49–397)

## 2018-12-10 LAB — SARS CORONAVIRUS 2 BY RT PCR (HOSPITAL ORDER, PERFORMED IN ~~LOC~~ HOSPITAL LAB): SARS Coronavirus 2: NEGATIVE

## 2018-12-10 MED ORDER — SODIUM CHLORIDE 0.9 % IV BOLUS
1000.0000 mL | Freq: Once | INTRAVENOUS | Status: AC
  Administered 2018-12-10: 1000 mL via INTRAVENOUS

## 2018-12-10 NOTE — Discharge Instructions (Addendum)
You were cocaine positive.  You showed signs of severe dehydration with an elevated kidney function of 2.  We gave you fluids.  You should follow-up with your primary care doctor in 2 days for a kidney function recheck.  You should avoid drugs.  Return to the ER for worsening chest pain or any other concerns

## 2018-12-10 NOTE — ED Notes (Signed)
Patient is resting comfortably. 

## 2018-12-10 NOTE — ED Provider Notes (Signed)
[facility] Emergency Department Provider Note  ____________________________________________   First MD Initiated Contact with Patient 12/10/18 2143     (approximate)  I have reviewed the triage vital signs and the nursing notes.   HISTORY  Chief Complaint Chest Pain    HPI Jonathan Mcmillan is a 23 y.o. male with diabetes, and hypertension who presents with chest pain.  Patient started having right-sided chest pain that started 2 days ago.  The pain is constant, nonradiating, nothing makes it better, nothing makes it worse.  Somewhat reproducible.  He denies any fevers or abdominal pain.  He said he is been drinking a lot water but still felt dehydrated recently.  Endorses some dark urine.  He denies drugs except for THC.  He denies daily alcohol use.  Denies any shortness of breath or coughing.        Past Medical History:  Diagnosis Date  . Diabetes 1.5, managed as type 1 ([place])   . Perirectal abscess 06/16/2017    Patient Active Problem List   Diagnosis Date Noted  . Abdominal pain 09/09/2018  . Right arm cellulitis 01/26/2018  . Sepsis ([place]) 03/06/2017  . Malnutrition of moderate degree 05/22/2016  . DKA (diabetic ketoacidoses) ([place]) 05/21/2016  . Type I (juvenile type) diabetes mellitus without mention of complication, uncontrolled 10/05/2010  . Hypertension 10/05/2010  . Obesity 10/05/2010    Past Surgical History:  Procedure Laterality Date  . INCISION AND DRAINAGE PERIRECTAL ABSCESS N/A 06/16/2017   Procedure: IRRIGATION AND DEBRIDEMENT PERIRECTAL ABSCESS;  Surgeon: Florene Glen, MD;  Location: [facility] ORS;  Service: General;  Laterality: N/A;  . none      Prior to Admission medications   Medication Sig Start Date End Date Taking? Authorizing Provider  blood glucose meter kit and supplies KIT Dispense based on patient and insurance preference. Use up to four times daily as directed. (FOR ICD-9 250.00, 250.01). 09/10/18   Hillary Bow,  MD  insulin aspart protamine- aspart (NOVOLOG MIX 70/30) (70-30) 100 UNIT/ML injection Inject 0.2 mLs (20 Units total) into the skin 2 (two) times daily with a meal. 09/10/18   Sudini, Srikar, MD  insulin glargine (LANTUS) 100 UNIT/ML injection Inject 30 Units into the skin at bedtime.    [provider]  lisinopril (ZESTRIL) 40 MG tablet Take 1 tablet (40 mg total) by mouth daily. 09/11/18   Hillary Bow, MD  naproxen (NAPROSYN) 500 MG tablet Take 1 tablet (500 mg total) by mouth 2 (two) times daily with a meal. 09/22/18   Lavonia Drafts, MD  naproxen (NAPROSYN) 500 MG tablet Take 1 tablet (500 mg total) by mouth 2 (two) times daily with a meal. 09/24/18   Lavonia Drafts, MD  ondansetron (ZOFRAN ODT) 4 MG disintegrating tablet Take 1 tablet (4 mg total) by mouth every 8 (eight) hours as needed for nausea or vomiting. 10/26/18   Paulette Blanch, MD  pantoprazole (PROTONIX) 40 MG tablet Take 1 tablet (40 mg total) by mouth daily. 09/10/18   Hillary Bow, MD    Allergies Patient has no known allergies.  Family History  Problem Relation Age of Onset  . Diabetes Mother     Social History Social History   Tobacco Use  . Smoking status: Current Every Day Smoker    Packs/day: 0.50    Types: Cigarettes  . Smokeless tobacco: Never Used  Substance Use Topics  . Alcohol use: No  . Drug use: Yes    Types: Marijuana  Review of Systems Constitutional: No fever/chills, positive for increased thirst Eyes: No visual changes. ENT: No sore throat. Cardiovascular: Positive chest pain Respiratory: Denies shortness of breath. Gastrointestinal: No abdominal pain.  No nausea, no vomiting.  No diarrhea.  No constipation. Genitourinary: Negative for dysuria. Musculoskeletal: Negative for back pain. Skin: Negative for rash. Neurological: Negative for headaches, focal weakness or numbness. All other ROS negative ____________________________________________   PHYSICAL EXAM:  VITAL  SIGNS: ED Triage Vitals [12/10/18 1958]  Enc Vitals Group     BP (!) 87/42     Pulse Rate (!) 107     Resp 20     Temp 98.3 F (36.8 C)     Temp Source Oral     SpO2 98 %     Weight 140 lb (63.5 kg)     Height _0  (1.753 m)     Head Circumference      Peak Flow      Pain Score 8     Pain Loc      Pain Edu?      Excl. in [place]?     Constitutional: Alert and oriented. Well appearing and in no acute distress. Eyes: Conjunctivae are normal. EOMI. Head: Atraumatic. Nose: No congestion/rhinnorhea. Mouth/Throat: Mucous membranes are moist.   Neck: No stridor. Trachea Midline. FROM Cardiovascular: Tachycardic regular rhythm. Grossly normal heart sounds.  Good peripheral circulation. Respiratory: Normal respiratory effort.  No retractions. Lungs CTAB. Gastrointestinal: Soft and nontender. No distention. No abdominal bruits.  Musculoskeletal: No lower extremity tenderness nor edema.  No joint effusions. Neurologic:  Normal speech and language. No gross focal neurologic deficits are appreciated.  Skin:  Skin is warm, dry and intact. No rash noted. Psychiatric: Mood and affect are normal. Speech and behavior are normal. GU: Deferred   ____________________________________________   LABS (all labs ordered are listed, but only abnormal results are displayed)  Labs Reviewed  BASIC METABOLIC PANEL - Abnormal; Notable for the following components:      Result Value   Sodium 134 (*)    Glucose, Bld 225 (*)    BUN 46 (*)    Creatinine, Ser 2.06 (*)    GFR calc non Af Amer 43 (*)    GFR calc Af Amer 50 (*)    All other components within normal limits  CBC - Abnormal; Notable for the following components:   RBC 4.13 (*)    Hemoglobin 12.2 (*)    HCT 35.9 (*)    All other components within normal limits  GLUCOSE, CAPILLARY - Abnormal; Notable for the following components:   Glucose-Capillary 206 (*)    All other components within normal limits  URINALYSIS, COMPLETE (UACMP) WITH  MICROSCOPIC - Abnormal; Notable for the following components:   Color, Urine YELLOW (*)    APPearance CLEAR (*)    Glucose, UA 150 (*)    Hgb urine dipstick SMALL (*)    All other components within normal limits  GLUCOSE, CAPILLARY - Abnormal; Notable for the following components:   Glucose-Capillary 102 (*)    All other components within normal limits  URINE DRUG SCREEN, QUALITATIVE ([facility] ONLY) - Abnormal; Notable for the following components:   Cocaine Metabolite,[place] POSITIVE (*)    Cannabinoid 50 Ng, Ur [place] POSITIVE (*)    All other components within normal limits  SARS CORONAVIRUS 2 (HOSPITAL ORDER, [place] [facility])  CK  HEPATIC FUNCTION PANEL  BASIC METABOLIC PANEL  CBG MONITORING, ED  TROPONIN I (  HIGH SENSITIVITY)   ____________________________________________   ED ECG REPORT I, Vanessa Orient, the attending physician, personally viewed and interpreted this ECG.  EKG is sinus tachycardia rate of 106, some early re-pole, no other elevations, no depressions, no T wave inversion.  This is similar to his prior EKG. ____________________________________________  [place], personally viewed and evaluated these images (plain radiographs) as part of my medical decision making, as well as reviewing the written report by the radiologist.  ED MD interpretation: No evidence of pneumonia  Official radiology report(s): Dg Chest 1 View  Result Date: 12/10/2018 CLINICAL DATA:  Right-sided chest pain. Dizziness. EXAM: CHEST  1 VIEW COMPARISON:  Radiographs 10/26/2018 FINDINGS: The cardiomediastinal contours are normal. The lungs are clear. Pulmonary vasculature is normal. No consolidation, pleural effusion, or pneumothorax. No acute osseous abnormalities are seen. IMPRESSION: Negative AP view of the chest. Electronically Signed   By: Keith Rake M.D.   On: 12/10/2018 22:01    ____________________________________________   PROCEDURES   Procedure(s) performed (including Critical Care):  Procedures   ____________________________________________   INITIAL IMPRESSION / ASSESSMENT AND PLAN / ED COURSE   KHARY SCHABEN was evaluated in Emergency Department on 12/10/2018 for the symptoms described in the history of present illness. He was evaluated in the context of the global COVID-19 pandemic, which necessitated consideration that the patient might be at risk for infection with the SARS-CoV-2 virus that causes COVID-19. Institutional protocols and algorithms that pertain to the evaluation of patients at risk for COVID-19 are in a state of rapid change based on information released by regulatory bodies including the CDC and federal and state organizations. These policies and algorithms were followed during the patient's care in the ED.    Most Likely DDx:  -MSK (atypical chest pain) but will get cardiac markers to evaluate for ACS given risk factors/age    DDx that was also considered d/t potential to cause harm, but was found less likely based on history and physical (as detailed above): -PNA (no fevers, cough but CXR to evaluate) -PNX (reassured with equal b/l breath sounds, CXR to evaluate) -Symptomatic anemia (will get H&H) -Pulmonary embolism as no sob at rest, not pleuritic in nature, no hypoxia -Aortic Dissection as no tearing pain and no radiation to the mid back, pulses equal -Pericarditis no rub on exam, EKG changes or hx to suggest dx -Tamponade (no notable SOB, tachycardic, hypotensive) -Esophageal rupture (no h/o diffuse vomitting/no crepitus)   Patient noted to have a creatinine elevation of 2.  This is double patient's baseline.  Patient's glucose is 225.  White count is normal. Trop negative.   Patient urine is negative for UTI.  Patient is cocaine and THC positive.  Chest pain is minimal in nature and very low suspicion for this being dissection.  Heart rate has come down with fluid and blood pressure has  come up.  I suspect patient was severely dehydrated from cocaine use.  CK level is normal.  We will repeat patient's BMP.  If creatinine is better will anticipate discharge home.    ____________________________________________   FINAL CLINICAL IMPRESSION(S) / ED DIAGNOSES   Final diagnoses:  Dizziness  Chest pain     MEDICATIONS GIVEN DURING THIS VISIT:  Medications - No data to display   ED Discharge Orders    None       Note:  This document was prepared using Dragon voice recognition software and may include unintentional dictation errors.   Marjean Donna  E, MD 12/10/18 [id number]

## 2018-12-10 NOTE — ED Notes (Signed)
Repeat labs drawn and sent. Pt given graham crackers and apple juice as requested.

## 2018-12-10 NOTE — ED Triage Notes (Signed)
Pt arrives with complaints of right sided chest pain and dizziness for the last 2 days. Pt states for the last "few days" he feels fatigued. CBG readings in 300s at home. Pt took his prescribed insulin medication. Pt hypotensive in triage

## 2018-12-11 LAB — GLUCOSE, CAPILLARY: Glucose-Capillary: 95 mg/dL (ref 70–99)

## 2018-12-11 LAB — TROPONIN I (HIGH SENSITIVITY): Troponin I (High Sensitivity): <2 ng/L (ref <18)

## 2018-12-11 NOTE — ED Provider Notes (Signed)
-----------------------------------------   12:39 AM on 12/11/2018 -----------------------------------------   Blood pressure 116/71, pulse 93, temperature 98.3 F (36.8 C), temperature source Oral, resp. rate 18, height 5\' 9"  (1.753 m), weight 63.5 kg, SpO2 99 %.  Assuming care from Dr. Jari Pigg of [facility] is a 23 y.o. male with a chief complaint of Chest Pain .    Please refer to H&P by previous MD for further details.  The current plan of care is to f/u repeat BMP, COVID, and troponin.   Repeat troponin negative.  COVID negative.  Blood glucose normalized after IV fluids.  Creatinine improved after fluids drug cessation counseling was provided to the patient.  Recommended increase oral hydration for the next 24 hours.  Recommended close follow-up with PCP.  Discussed my standard return precautions.  Patient be discharged home per plan left by Dr. Colin Ina, [place], MD 12/11/18 [phone]

## 2018-12-20 ENCOUNTER — Other Ambulatory Visit: Payer: Self-pay

## 2018-12-20 ENCOUNTER — Emergency Department
Admission: EM | Admit: 2018-12-20 | Discharge: 2018-12-20 | Disposition: A | Discharge status: Home / Self Care | Payer: Self-pay | Attending: Emergency Medicine | Admitting: Emergency Medicine

## 2018-12-20 ENCOUNTER — Encounter: Payer: Self-pay | Admitting: Emergency Medicine

## 2018-12-20 DIAGNOSIS — I10 Essential (primary) hypertension: Secondary | ICD-10-CM | POA: Insufficient documentation

## 2018-12-20 DIAGNOSIS — F17210 Nicotine dependence, cigarettes, uncomplicated: Secondary | ICD-10-CM | POA: Insufficient documentation

## 2018-12-20 DIAGNOSIS — H04301 Unspecified dacryocystitis of right lacrimal passage: Secondary | ICD-10-CM

## 2018-12-20 DIAGNOSIS — E109 Type 1 diabetes mellitus without complications: Secondary | ICD-10-CM | POA: Insufficient documentation

## 2018-12-20 DIAGNOSIS — H04321 Acute dacryocystitis of right lacrimal passage: Secondary | ICD-10-CM | POA: Insufficient documentation

## 2018-12-20 DIAGNOSIS — Z79899 Other long term (current) drug therapy: Secondary | ICD-10-CM | POA: Insufficient documentation

## 2018-12-20 MED ORDER — AMOXICILLIN 500 MG PO CAPS: 500.0000 mg | ORAL_CAPSULE | Freq: Three times a day (TID) | ORAL | 0 refills | Status: DC

## 2018-12-20 NOTE — ED Triage Notes (Signed)
PT arrived via POV with right eye swelling, pt states he thinks it is a stye. Sxs started about 2 days ago.

## 2018-12-20 NOTE — Discharge Instructions (Signed)
Follow-up with your regular doctor or [facility] if not better in 2 days.  Return emergency department for worsening.  Take antibiotic as prescribed.  Apply a warm compress to the eye.  Do this for 5 to 10 minutes several times throughout the day.  Return as needed

## 2018-12-20 NOTE — ED Notes (Signed)
See triage note. Pt upper eyelid on right eye is swollen. Pt states Sx onset yesterday and he has photophobia since Sx started and he had to wear a visor to protect his eyes on drive in. Denies any previous Hx of similar Sx.

## 2018-12-20 NOTE — ED Provider Notes (Signed)
[facility] Emergency Department Provider Note  ____________________________________________   First MD Initiated Contact with Patient 12/20/18 1914     (approximate)  I have reviewed the triage vital signs and the nursing notes.   HISTORY  Chief Complaint Eye Problem    HPI Jonathan Mcmillan is a 23 y.o. male presents emergency department complaining of right upper eyelid swelling since yesterday.  He denies any fever or chills.  States the eye has been swollen and tender and now he has clear drainage from his eye.  He denies any change in vision.  Denies any eye injury.    Past Medical History:  Diagnosis Date   Diabetes 1.5, managed as type 1 ([place])    Perirectal abscess 06/16/2017    Patient Active Problem List   Diagnosis Date Noted   Abdominal pain 09/09/2018   Right arm cellulitis 01/26/2018   Sepsis ([place]) 03/06/2017   Malnutrition of moderate degree 05/22/2016   DKA (diabetic ketoacidoses) ([place]) 05/21/2016   Type I (juvenile type) diabetes mellitus without mention of complication, uncontrolled 10/05/2010   Hypertension 10/05/2010   Obesity 10/05/2010    Past Surgical History:  Procedure Laterality Date   INCISION AND DRAINAGE PERIRECTAL ABSCESS N/A 06/16/2017   Procedure: IRRIGATION AND DEBRIDEMENT PERIRECTAL ABSCESS;  Surgeon: Florene Glen, MD;  Location: [facility] ORS;  Service: General;  Laterality: N/A;   none      Prior to Admission medications   Medication Sig Start Date End Date Taking? Authorizing Provider  amoxicillin (AMOXIL) 500 MG capsule Take 1 capsule (500 mg total) by mouth 3 (three) times daily. 12/20/18   Caryn Section Linden Dolin, PA-C  blood glucose meter kit and supplies KIT Dispense based on patient and insurance preference. Use up to four times daily as directed. (FOR ICD-9 250.00, 250.01). 09/10/18   Hillary Bow, MD  insulin aspart protamine- aspart (NOVOLOG MIX 70/30) (70-30) 100 UNIT/ML injection Inject 0.2 mLs  (20 Units total) into the skin 2 (two) times daily with a meal. 09/10/18   Sudini, Srikar, MD  insulin glargine (LANTUS) 100 UNIT/ML injection Inject 30 Units into the skin at bedtime.    [provider]  insulin lispro (HUMALOG) 100 UNIT/ML injection Inject 15 Units into the skin at bedtime. 01/27/18 01/27/19  [provider]  lisinopril (ZESTRIL) 40 MG tablet Take 1 tablet (40 mg total) by mouth daily. 09/11/18   Hillary Bow, MD  naproxen (NAPROSYN) 500 MG tablet Take 1 tablet (500 mg total) by mouth 2 (two) times daily with a meal. Patient not taking: Reported on 12/10/2018 09/22/18   Lavonia Drafts, MD  naproxen (NAPROSYN) 500 MG tablet Take 1 tablet (500 mg total) by mouth 2 (two) times daily with a meal. Patient not taking: Reported on 12/10/2018 09/24/18   Lavonia Drafts, MD  ondansetron (ZOFRAN ODT) 4 MG disintegrating tablet Take 1 tablet (4 mg total) by mouth every 8 (eight) hours as needed for nausea or vomiting. Patient not taking: Reported on 12/10/2018 10/26/18   Paulette Blanch, MD  pantoprazole (PROTONIX) 40 MG tablet Take 1 tablet (40 mg total) by mouth daily. Patient not taking: Reported on 12/10/2018 09/10/18   Hillary Bow, MD    Allergies Patient has no known allergies.  Family History  Problem Relation Age of Onset   Diabetes Mother     Social History Social History   Tobacco Use   Smoking status: Current Every Day Smoker    Packs/day: 0.50    Types: Cigarettes  Smokeless tobacco: Never Used  Substance Use Topics   Alcohol use: No   Drug use: Yes    Types: Marijuana    Review of Systems  Constitutional: No fever/chills Eyes: No visual changes.  Positive for swollen eyelid ENT: No sore throat. Respiratory: Denies cough Genitourinary: Negative for dysuria. Musculoskeletal: Negative for back pain. Skin: Negative for rash.    ____________________________________________   PHYSICAL EXAM:  VITAL SIGNS: ED Triage Vitals [12/20/18  1734]  Enc Vitals Group     BP 113/60     Pulse Rate (!) 106     Resp 16     Temp 99 F (37.2 C)     Temp Source Oral     SpO2 98 %     Weight 140 lb (63.5 kg)     Height 5' 9"  (1.753 m)     Head Circumference      Peak Flow      Pain Score 10     Pain Loc      Pain Edu?      Excl. in [place]?     Constitutional: Alert and oriented. Well appearing and in no acute distress. Eyes: Conjunctivae are normal.  Right upper lid is diffusely swollen and thickened, tears are noted to be draining, no actual stye is noted.  Visual acuity is normal Head: Atraumatic. Nose: No congestion/rhinnorhea. Mouth/Throat: Mucous membranes are moist.   Neck:  supple no lymphadenopathy noted Cardiovascular: Normal rate, regular rhythm. Heart sounds are normal Respiratory: Normal respiratory effort.  No retractions, lungs c t a  GU: deferred Musculoskeletal: FROM all extremities, warm and well perfused Neurologic:  Normal speech and language.  Skin:  Skin is warm, dry and intact. No rash noted. Psychiatric: Mood and affect are normal. Speech and behavior are normal.  ____________________________________________   LABS (all labs ordered are listed, but only abnormal results are displayed)  Labs Reviewed - No data to display ____________________________________________   ____________________________________________  RADIOLOGY    ____________________________________________   PROCEDURES  Procedure(s) performed: No  Procedures    ____________________________________________   INITIAL IMPRESSION / ASSESSMENT AND PLAN / ED COURSE  Pertinent labs & imaging results that were available during my care of the patient were reviewed by me and considered in my medical decision making (see chart for details).   Patient is a 23 year old male presents emergency department with right upper eyelid swelling.  States his eyes had some drainage and crusting.  Thinks he has a stye.  Physical exam shows  the right eye lid to be swollen and diffusely tender, no actual stye is noted, clear drainage is noted from the right eye.  Discussed the findings with patient.  Feel like dacryocystitis would be more appropriate than stye diagnosis at this time.  He was given a antibiotic, is instructed to use a warm compress, and follow-up with [facility] if not better in 2 days.  He is to return to the emergency department if worsening.  States he understands will comply.  Is discharged stable condition.    PIKE [place] was evaluated in Emergency Department on 12/20/2018 for the symptoms described in the history of present illness. He was evaluated in the context of the global COVID-19 pandemic, which necessitated consideration that the patient might be at risk for infection with the SARS-CoV-2 virus that causes COVID-19. Institutional protocols and algorithms that pertain to the evaluation of patients at risk for COVID-19 are in a state of rapid change based on information released  by regulatory bodies including the CDC and federal and state organizations. These policies and algorithms were followed during the patient's care in the ED.   As part of my medical decision making, I reviewed the following data within the [place] notes reviewed and incorporated, Old chart reviewed, Notes from prior ED visits and [place] Controlled Substance Database  ____________________________________________   FINAL CLINICAL IMPRESSION(S) / ED DIAGNOSES  Final diagnoses:  Dacrocystitis, right      NEW MEDICATIONS STARTED DURING THIS VISIT:  New Prescriptions   AMOXICILLIN (AMOXIL) 500 MG CAPSULE    Take 1 capsule (500 mg total) by mouth 3 (three) times daily.     Note:  This document was prepared using Dragon voice recognition software and may include unintentional dictation errors.    Versie Starks, PA-C 12/20/18 [place], MD 12/20/18 [phone]

## 2019-02-04 ENCOUNTER — Emergency Department
Admission: EM | Admit: 2019-02-04 | Discharge: 2019-02-04 | Disposition: A | Discharge status: Home / Self Care | Payer: Self-pay | Attending: Emergency Medicine | Admitting: Emergency Medicine

## 2019-02-04 ENCOUNTER — Encounter: Payer: Self-pay | Admitting: Emergency Medicine

## 2019-02-04 ENCOUNTER — Other Ambulatory Visit: Payer: Self-pay

## 2019-02-04 DIAGNOSIS — F17210 Nicotine dependence, cigarettes, uncomplicated: Secondary | ICD-10-CM | POA: Insufficient documentation

## 2019-02-04 DIAGNOSIS — R1010 Upper abdominal pain, unspecified: Secondary | ICD-10-CM | POA: Insufficient documentation

## 2019-02-04 DIAGNOSIS — Z79899 Other long term (current) drug therapy: Secondary | ICD-10-CM | POA: Insufficient documentation

## 2019-02-04 DIAGNOSIS — R109 Unspecified abdominal pain: Secondary | ICD-10-CM

## 2019-02-04 DIAGNOSIS — I10 Essential (primary) hypertension: Secondary | ICD-10-CM | POA: Insufficient documentation

## 2019-02-04 DIAGNOSIS — R112 Nausea with vomiting, unspecified: Secondary | ICD-10-CM | POA: Insufficient documentation

## 2019-02-04 DIAGNOSIS — E109 Type 1 diabetes mellitus without complications: Secondary | ICD-10-CM | POA: Insufficient documentation

## 2019-02-04 LAB — CBC
HCT: 38.1 % — ABNORMAL LOW (ref 39.0–52.0)
Hemoglobin: 12.8 g/dL — ABNORMAL LOW (ref 13.0–17.0)
MCH: 30 pg (ref 26.0–34.0)
MCHC: 33.6 g/dL (ref 30.0–36.0)
MCV: 89.2 fL (ref 80.0–100.0)
Platelets: 271 10*3/uL (ref 150–400)
RBC: 4.27 MIL/uL (ref 4.22–5.81)
RDW: 11.5 % (ref 11.5–15.5)
WBC: 5.6 10*3/uL (ref 4.0–10.5)
nRBC: 0 % (ref 0.0–0.2)

## 2019-02-04 LAB — COMPREHENSIVE METABOLIC PANEL
ALT: 19 U/L (ref 0–44)
AST: 14 U/L — ABNORMAL LOW (ref 15–41)
Albumin: 4.3 g/dL (ref 3.5–5.0)
Alkaline Phosphatase: 99 U/L (ref 38–126)
Anion gap: 13 (ref 5–15)
BUN: 15 mg/dL (ref 6–20)
CO2: 25 mmol/L (ref 22–32)
Calcium: 9.5 mg/dL (ref 8.9–10.3)
Chloride: 99 mmol/L (ref 98–111)
Creatinine, Ser: 1.39 mg/dL — ABNORMAL HIGH (ref 0.61–1.24)
GFR calc Af Amer: >60 mL/min (ref >60)
GFR calc non Af Amer: >60 mL/min (ref >60)
Glucose, Bld: 376 mg/dL — ABNORMAL HIGH (ref 70–99)
Potassium: 4.5 mmol/L (ref 3.5–5.1)
Sodium: 137 mmol/L (ref 135–145)
Total Bilirubin: 1.9 mg/dL — ABNORMAL HIGH (ref 0.3–1.2)
Total Protein: 7.6 g/dL (ref 6.5–8.1)

## 2019-02-04 LAB — LIPASE, BLOOD: Lipase: 27 U/L (ref 11–51)

## 2019-02-04 MED ORDER — SUCRALFATE 1 G PO TABS: 1.0000 g | ORAL_TABLET | Freq: Four times a day (QID) | ORAL | 0 refills | Status: DC

## 2019-02-04 MED ORDER — ONDANSETRON 4 MG PO TBDP
4.0000 mg | ORAL_TABLET | Freq: Once | ORAL | Status: DC | PRN
  Filled 2019-02-04: qty 1

## 2019-02-04 MED ORDER — MORPHINE SULFATE (PF) 4 MG/ML IV SOLN
4.0000 mg | Freq: Once | INTRAVENOUS | Status: AC
  Administered 2019-02-04: 4 mg via INTRAVENOUS
  Filled 2019-02-04: qty 1

## 2019-02-04 MED ORDER — ALUM & MAG HYDROXIDE-SIMETH 200-200-20 MG/5ML PO SUSP
30.0000 mL | Freq: Once | ORAL | Status: AC
  Administered 2019-02-04: 08:00:00 30 mL via ORAL
  Filled 2019-02-04: qty 30

## 2019-02-04 MED ORDER — ONDANSETRON HCL 4 MG/2ML IJ SOLN
4.0000 mg | INTRAMUSCULAR | Status: AC
  Administered 2019-02-04: 4 mg via INTRAVENOUS
  Filled 2019-02-04: qty 2

## 2019-02-04 MED ORDER — LIDOCAINE VISCOUS HCL 2 % MT SOLN
15.0000 mL | Freq: Once | OROMUCOSAL | Status: AC
  Administered 2019-02-04: 15 mL via ORAL
  Filled 2019-02-04: qty 15

## 2019-02-04 NOTE — ED Notes (Signed)
Pt reports pain is down to a 4/10 after GI medications. MD notified. Pt states "I just want to rest."

## 2019-02-04 NOTE — ED Notes (Signed)
Introduced self to pt. Pt resting in bed in NAD. Pt reports pain has decreased to a 6/10.

## 2019-02-04 NOTE — ED Triage Notes (Signed)
Patient to ER for c/o upper abd pain x2 days. Patient denies fever or diarrhea. +N/V.

## 2019-02-04 NOTE — Discharge Instructions (Addendum)
Please seek medical attention for any high fevers, chest pain, shortness of breath, change in behavior, persistent vomiting, bloody stool or any other new or concerning symptoms.  

## 2019-02-04 NOTE — ED Provider Notes (Signed)
[facility] Emergency Department Provider Note   ____________________________________________   I have reviewed the triage vital signs and the nursing notes.   HISTORY  Chief Complaint Abdominal Pain  History limited by: Not Limited   HPI Jonathan Mcmillan is a 23 y.o. male who presents to the emergency department today because of concern for abdominal pain. Says that its located in the upper abdomen.  He states that it started yesterday while he was eating.  He feels like he is having a hard time digesting food.  He has had some nonbloody vomiting.  He denies any diarrhea or bloody stool.  He denies any fevers.  He has had similar symptoms in the past.  He denies following up with GI.  He did not try taking any medication for his abdominal pain. Was given medication prior to my evaluation which he says has helped.   Records reviewed. Per medical record review patient has a history of diabetes, HTN, admission for abdominal pain in the past.   Past Medical History:  Diagnosis Date  . Diabetes 1.5, managed as type 1 ([place])   . Perirectal abscess 06/16/2017    Patient Active Problem List   Diagnosis Date Noted  . Abdominal pain 09/09/2018  . Right arm cellulitis 01/26/2018  . Sepsis ([place]) 03/06/2017  . Malnutrition of moderate degree 05/22/2016  . DKA (diabetic ketoacidoses) ([place]) 05/21/2016  . Type I (juvenile type) diabetes mellitus without mention of complication, uncontrolled 10/05/2010  . Hypertension 10/05/2010  . Obesity 10/05/2010    Past Surgical History:  Procedure Laterality Date  . INCISION AND DRAINAGE PERIRECTAL ABSCESS N/A 06/16/2017   Procedure: IRRIGATION AND DEBRIDEMENT PERIRECTAL ABSCESS;  Surgeon: Florene Glen, MD;  Location: [facility] ORS;  Service: General;  Laterality: N/A;  . none      Prior to Admission medications   Medication Sig Start Date End Date Taking? Authorizing Provider  amoxicillin (AMOXIL) 500 MG capsule Take 1 capsule  (500 mg total) by mouth 3 (three) times daily. 12/20/18   Caryn Section Linden Dolin, PA-C  blood glucose meter kit and supplies KIT Dispense based on patient and insurance preference. Use up to four times daily as directed. (FOR ICD-9 250.00, 250.01). 09/10/18   Hillary Bow, MD  insulin aspart protamine- aspart (NOVOLOG MIX 70/30) (70-30) 100 UNIT/ML injection Inject 0.2 mLs (20 Units total) into the skin 2 (two) times daily with a meal. 09/10/18   Sudini, Srikar, MD  insulin glargine (LANTUS) 100 UNIT/ML injection Inject 30 Units into the skin at bedtime.    [provider]  lisinopril (ZESTRIL) 40 MG tablet Take 1 tablet (40 mg total) by mouth daily. 09/11/18   Hillary Bow, MD  naproxen (NAPROSYN) 500 MG tablet Take 1 tablet (500 mg total) by mouth 2 (two) times daily with a meal. Patient not taking: Reported on 12/10/2018 09/22/18   Lavonia Drafts, MD  naproxen (NAPROSYN) 500 MG tablet Take 1 tablet (500 mg total) by mouth 2 (two) times daily with a meal. Patient not taking: Reported on 12/10/2018 09/24/18   Lavonia Drafts, MD  ondansetron (ZOFRAN ODT) 4 MG disintegrating tablet Take 1 tablet (4 mg total) by mouth every 8 (eight) hours as needed for nausea or vomiting. Patient not taking: Reported on 12/10/2018 10/26/18   Paulette Blanch, MD  pantoprazole (PROTONIX) 40 MG tablet Take 1 tablet (40 mg total) by mouth daily. Patient not taking: Reported on 12/10/2018 09/10/18   Hillary Bow, MD    Allergies Patient  has no known allergies.  Family History  Problem Relation Age of Onset  . Diabetes Mother     Social History Social History   Tobacco Use  . Smoking status: Current Every Day Smoker    Packs/day: 0.50    Types: Cigarettes  . Smokeless tobacco: Never Used  Substance Use Topics  . Alcohol use: No  . Drug use: Yes    Types: Marijuana    Review of Systems Constitutional: No fever/chills Eyes: No visual changes. ENT: No sore throat. Cardiovascular: Denies chest  pain. Respiratory: Denies shortness of breath. Gastrointestinal: Positive for abdominal pain, nausea and vomiting.  Genitourinary: Negative for dysuria. Musculoskeletal: Negative for back pain. Skin: Negative for rash. Neurological: Negative for headaches, focal weakness or numbness.  ____________________________________________   PHYSICAL EXAM:  VITAL SIGNS: ED Triage Vitals [02/04/19 0622]  Enc Vitals Group     BP (!) 171/114     Pulse Rate (!) 112     Resp 20     Temp 97.8 F (36.6 C)     Temp Source Oral     SpO2 100 %     Weight 145 lb (65.8 kg)     Height 5' 9" (1.753 m)     Head Circumference      Peak Flow      Pain Score 10   Constitutional: Alert and oriented.  Eyes: Conjunctivae are normal.  ENT      Head: Normocephalic and atraumatic.      Nose: No congestion/rhinnorhea.      Mouth/Throat: Mucous membranes are moist.      Neck: No stridor. Cardiovascular: Normal rate, regular rhythm.  No murmurs, rubs, or gallops. Respiratory: Normal respiratory effort without tachypnea nor retractions. Breath sounds are clear and equal bilaterally. No wheezes/rales/rhonchi. Gastrointestinal: Soft and non tender. No rebound. No guarding.  Genitourinary: Deferred Musculoskeletal: Normal range of motion in all extremities.  Neurologic:  Normal speech and language. No gross focal neurologic deficits are appreciated.  Skin:  Skin is warm, dry and intact. No rash noted. Psychiatric: Mood and affect are normal. Speech and behavior are normal. Patient exhibits appropriate insight and judgment.  ____________________________________________    LABS (pertinent positives/negatives)  Lipase 27 CBC wbc 5.6, hgb 12.8, plt 271 CMP wnl except glu 376, cr 1.39, ast 14, t bili 1.9  ____________________________________________   EKG  None  ____________________________________________     RADIOLOGY  None  ____________________________________________   PROCEDURES  Procedures  ____________________________________________   INITIAL IMPRESSION / ASSESSMENT AND PLAN / ED COURSE  Pertinent labs & imaging results that were available during my care of the patient were reviewed by me and considered in my medical decision making (see chart for details).   Patient presented to the emergency department today because of concerns for abdominal pain.  Patient has history of similar symptoms in the past.  Blood work without any concerning leukocytosis.  Patient's blood sugar is elevated however no anion gap elevation.  Patient did feel better after medications here.  I discussed with patient importance of following up with GI.  ____________________________________________   FINAL CLINICAL IMPRESSION(S) / ED DIAGNOSES  Final diagnoses:  Abdominal pain, unspecified abdominal location     Note: This dictation was prepared with Dragon dictation. Any transcriptional errors that result from this process are unintentional     Nance Pear, MD 02/04/19 [phone]

## 2019-05-03 ENCOUNTER — Emergency Department: Payer: Self-pay

## 2019-05-03 ENCOUNTER — Other Ambulatory Visit: Payer: Self-pay

## 2019-05-03 ENCOUNTER — Emergency Department
Admission: EM | Admit: 2019-05-03 | Discharge: 2019-05-03 | Disposition: A | Discharge status: Home / Self Care | Payer: Self-pay | Attending: Emergency Medicine | Admitting: Emergency Medicine

## 2019-05-03 DIAGNOSIS — F1290 Cannabis use, unspecified, uncomplicated: Secondary | ICD-10-CM

## 2019-05-03 DIAGNOSIS — Z794 Long term (current) use of insulin: Secondary | ICD-10-CM | POA: Insufficient documentation

## 2019-05-03 DIAGNOSIS — Z79899 Other long term (current) drug therapy: Secondary | ICD-10-CM | POA: Insufficient documentation

## 2019-05-03 DIAGNOSIS — R079 Chest pain, unspecified: Secondary | ICD-10-CM | POA: Insufficient documentation

## 2019-05-03 DIAGNOSIS — E109 Type 1 diabetes mellitus without complications: Secondary | ICD-10-CM | POA: Insufficient documentation

## 2019-05-03 DIAGNOSIS — R1110 Vomiting, unspecified: Secondary | ICD-10-CM | POA: Insufficient documentation

## 2019-05-03 DIAGNOSIS — F17210 Nicotine dependence, cigarettes, uncomplicated: Secondary | ICD-10-CM | POA: Insufficient documentation

## 2019-05-03 DIAGNOSIS — F1299 Cannabis use, unspecified with unspecified cannabis-induced disorder: Secondary | ICD-10-CM | POA: Insufficient documentation

## 2019-05-03 LAB — TROPONIN I (HIGH SENSITIVITY): Troponin I (High Sensitivity): 6 ng/L (ref <18)

## 2019-05-03 LAB — CBC
HCT: 35.2 % — ABNORMAL LOW (ref 39.0–52.0)
Hemoglobin: 11.9 g/dL — ABNORMAL LOW (ref 13.0–17.0)
MCH: 29.5 pg (ref 26.0–34.0)
MCHC: 33.8 g/dL (ref 30.0–36.0)
MCV: 87.1 fL (ref 80.0–100.0)
Platelets: 248 10*3/uL (ref 150–400)
RBC: 4.04 MIL/uL — ABNORMAL LOW (ref 4.22–5.81)
RDW: 11.3 % — ABNORMAL LOW (ref 11.5–15.5)
WBC: 4.9 10*3/uL (ref 4.0–10.5)
nRBC: 0 % (ref 0.0–0.2)

## 2019-05-03 LAB — BASIC METABOLIC PANEL
Anion gap: 10 (ref 5–15)
BUN: 17 mg/dL (ref 6–20)
CO2: 26 mmol/L (ref 22–32)
Calcium: 9.2 mg/dL (ref 8.9–10.3)
Chloride: 100 mmol/L (ref 98–111)
Creatinine, Ser: 1.25 mg/dL — ABNORMAL HIGH (ref 0.61–1.24)
GFR calc Af Amer: >60 mL/min (ref >60)
GFR calc non Af Amer: >60 mL/min (ref >60)
Glucose, Bld: 297 mg/dL — ABNORMAL HIGH (ref 70–99)
Potassium: 4.2 mmol/L (ref 3.5–5.1)
Sodium: 136 mmol/L (ref 135–145)

## 2019-05-03 MED ORDER — SODIUM CHLORIDE 0.9 % IV BOLUS
1000.0000 mL | Freq: Once | INTRAVENOUS | Status: AC
  Administered 2019-05-03: 1000 mL via INTRAVENOUS

## 2019-05-03 MED ORDER — FAMOTIDINE IN NACL 20-0.9 MG/50ML-% IV SOLN
20.0000 mg | Freq: Once | INTRAVENOUS | Status: AC
  Administered 2019-05-03: 20 mg via INTRAVENOUS
  Filled 2019-05-03: qty 50

## 2019-05-03 MED ORDER — ONDANSETRON HCL 4 MG/2ML IJ SOLN
4.0000 mg | Freq: Once | INTRAMUSCULAR | Status: AC
  Administered 2019-05-03: 4 mg via INTRAVENOUS
  Filled 2019-05-03: qty 2

## 2019-05-03 MED ORDER — ONDANSETRON 4 MG PO TBDP
4.0000 mg | ORAL_TABLET | Freq: Once | ORAL | Status: AC
  Administered 2019-05-03: 4 mg via ORAL
  Filled 2019-05-03: qty 1

## 2019-05-03 MED ORDER — ONDANSETRON 4 MG PO TBDP: 4.0000 mg | ORAL_TABLET | Freq: Three times a day (TID) | ORAL | 0 refills | Status: DC | PRN

## 2019-05-03 MED ORDER — SODIUM CHLORIDE 0.9% FLUSH: 3.0000 mL | Freq: Once | INTRAVENOUS | Status: DC

## 2019-05-03 NOTE — ED Triage Notes (Signed)
Pt presents via POV c/o "acid around chest". Reports seen x1 month ago at [facility] with similar symptoms and given a medicine to "break it up". Pt reports difficulty breathing associated with pain. Pt slightly uncooperative and aggressive.

## 2019-05-03 NOTE — Discharge Instructions (Signed)
In order for the pain and vomiting to stop, you will need to stop using marijuana.   Take the zofran as prescribed as well as the other medication you were prescribed by [facility].  Follow up with your primary care provider.  Return to the ER for symptoms that change or worsen or for new concerns if unable to schedule an appointment.

## 2019-05-03 NOTE — ED Provider Notes (Signed)
[facility] Emergency Department Provider Note ____________________________________________   None    (approximate)  I have reviewed the triage vital signs and the nursing notes.   HISTORY  Chief Complaint Gastroesophageal Reflux  HPI Jonathan Mcmillan is a 23 y.o. male who presents to the emergency department for treatment and evaluation of "stomach acid." He reports that he has had these symptoms in the past and was treated at "[facility]" and received prescriptions that help. He had one episode of vomiting yesterday, none today. Last bowel movement was yesterday. Epigastric pain increases when he stands up or lays down. Pain is the same as when he was evaluated at [facility].      Past Medical History:  Diagnosis Date  . Diabetes 1.5, managed as type 1 ([place])   . Perirectal abscess 06/16/2017    Patient Active Problem List   Diagnosis Date Noted  . Abdominal pain 09/09/2018  . Right arm cellulitis 01/26/2018  . Sepsis ([place]) 03/06/2017  . Malnutrition of moderate degree 05/22/2016  . DKA (diabetic ketoacidoses) ([place]) 05/21/2016  . Type I (juvenile type) diabetes mellitus without mention of complication, uncontrolled 10/05/2010  . Hypertension 10/05/2010  . Obesity 10/05/2010    Past Surgical History:  Procedure Laterality Date  . INCISION AND DRAINAGE PERIRECTAL ABSCESS N/A 06/16/2017   Procedure: IRRIGATION AND DEBRIDEMENT PERIRECTAL ABSCESS;  Surgeon: Florene Glen, MD;  Location: [facility] ORS;  Service: General;  Laterality: N/A;  . none      Prior to Admission medications   Medication Sig Start Date End Date Taking? Authorizing Provider  amoxicillin (AMOXIL) 500 MG capsule Take 1 capsule (500 mg total) by mouth 3 (three) times daily. 12/20/18   Caryn Section Linden Dolin, PA-C  blood glucose meter kit and supplies KIT Dispense based on patient and insurance preference. Use up to four times daily as directed. (FOR ICD-9 250.00, 250.01). 09/10/18   Hillary Bow, MD   insulin aspart protamine- aspart (NOVOLOG MIX 70/30) (70-30) 100 UNIT/ML injection Inject 0.2 mLs (20 Units total) into the skin 2 (two) times daily with a meal. 09/10/18   Sudini, Srikar, MD  insulin glargine (LANTUS) 100 UNIT/ML injection Inject 30 Units into the skin at bedtime.    [provider]  lisinopril (ZESTRIL) 40 MG tablet Take 1 tablet (40 mg total) by mouth daily. 09/11/18   Hillary Bow, MD  naproxen (NAPROSYN) 500 MG tablet Take 1 tablet (500 mg total) by mouth 2 (two) times daily with a meal. Patient not taking: Reported on 12/10/2018 09/22/18   Lavonia Drafts, MD  naproxen (NAPROSYN) 500 MG tablet Take 1 tablet (500 mg total) by mouth 2 (two) times daily with a meal. Patient not taking: Reported on 12/10/2018 09/24/18   Lavonia Drafts, MD  ondansetron (ZOFRAN-ODT) 4 MG disintegrating tablet Take 1 tablet (4 mg total) by mouth every 8 (eight) hours as needed for nausea or vomiting. 05/03/19   ,  B, FNP  pantoprazole (PROTONIX) 40 MG tablet Take 1 tablet (40 mg total) by mouth daily. Patient not taking: Reported on 12/10/2018 09/10/18   Hillary Bow, MD  sucralfate (CARAFATE) 1 g tablet Take 1 tablet (1 g total) by mouth 4 (four) times daily. 02/04/19   Nance Pear, MD    Allergies Patient has no known allergies.  Family History  Problem Relation Age of Onset  . Diabetes Mother     Social History Social History   Tobacco Use  . Smoking status: Current Every Day Smoker  Packs/day: 0.50    Types: Cigarettes  . Smokeless tobacco: Never Used  Substance Use Topics  . Alcohol use: No  . Drug use: Yes    Types: Marijuana    Review of Systems  Constitutional: No fever/chills Eyes: No visual changes. ENT: No sore throat. Cardiovascular: Denies chest pain. Respiratory: Denies shortness of breath. Gastrointestinal: Positive for abdominal pain. Positive for nausea and vomiting.  No diarrhea.  No constipation. Genitourinary: Negative for  dysuria. Musculoskeletal: Negative for back pain. Skin: Negative for rash. Neurological: Negative for headaches, focal weakness or numbness. ____________________________________________   PHYSICAL EXAM:  VITAL SIGNS: ED Triage Vitals  Enc Vitals Group     BP 05/03/19 1424 (!) 128/96     Pulse Rate 05/03/19 1424 (!) 107     Resp 05/03/19 1424 14     Temp 05/03/19 1424 98.3 F (36.8 C)     Temp Source 05/03/19 1424 Oral     SpO2 05/03/19 1424 99 %     Weight 05/03/19 1425 170 lb (77.1 kg)     Height --      Head Circumference --      Peak Flow --      Pain Score 05/03/19 1424 10     Pain Loc --      Pain Edu? --      Excl. in [place]? --     Constitutional: Alert and oriented. Well appearing and in no acute distress. Eyes: Conjunctivae are normal. Head: Atraumatic. Nose: No congestion/rhinnorhea. Mouth/Throat: Mucous membranes are moist.  Oropharynx non-erythematous. Neck: No stridor.   Hematological/Lymphatic/Immunilogical: No cervical lymphadenopathy. Cardiovascular: Normal rate, regular rhythm. Grossly normal heart sounds.  Good peripheral circulation. Respiratory: Normal respiratory effort.  No retractions. Lungs CTAB. Gastrointestinal: Soft and nontender. No distention. No abdominal bruits. Genitourinary:  Musculoskeletal: No lower extremity tenderness nor edema.  No joint effusions. Neurologic:  Normal speech and language. No gross focal neurologic deficits are appreciated. No gait instability. Skin:  Skin is warm, dry and intact. No rash noted. Psychiatric: Mood and affect are normal. Speech and behavior are normal.  ____________________________________________   LABS (all labs ordered are listed, but only abnormal results are displayed)  Labs Reviewed  BASIC METABOLIC PANEL - Abnormal; Notable for the following components:      Result Value   Glucose, Bld 297 (*)    Creatinine, Ser 1.25 (*)    All other components within normal limits  CBC - Abnormal; Notable  for the following components:   RBC 4.04 (*)    Hemoglobin 11.9 (*)    HCT 35.2 (*)    RDW 11.3 (*)    All other components within normal limits  TROPONIN I (HIGH SENSITIVITY)  TROPONIN I (HIGH SENSITIVITY)   ____________________________________________  EKG  ED ECG REPORT I,  , FNP-BC personally viewed and interpreted this ECG.   Date: 05/03/2019  EKG Time: 1430  Rate: 103  Rhythm: sinus tachycardia  Axis: normal  Intervals:none  ST&T Change: no ST elevation  ____________________________________________  RADIOLOGY  ED MD interpretation:    No acute cardiopulmonary abnormality per radiology.  Official radiology report(s): DG Chest 2 View  Result Date: 05/03/2019 CLINICAL DATA:  Chest pain.  Patient complains of acid around chest. EXAM: CHEST - 2 VIEW COMPARISON:  Radiographs 12/10/2018 and 10/26/2018. FINDINGS: The heart size and mediastinal contours are normal. The lungs are clear. There is no pleural effusion or pneumothorax. No acute osseous findings are identified. IMPRESSION: Stable chest.  No active cardiopulmonary process.  Electronically Signed   By: Richardean Sale M.D.   On: 05/03/2019 17:30    ____________________________________________   PROCEDURES  Procedure(s) performed (including Critical Care):  Procedures  ____________________________________________   INITIAL IMPRESSION / ASSESSMENT AND PLAN   23 year old male presents to the emergency department for evaluation of epigastric pain and nausea with one episode of vomiting yesterday. See HPI for further details. Very strong aroma of marijuana upon entering the room. Patient states that he smokes "2 or 3 joints per day."  DIFFERENTIAL DIAGNOSIS  Hyperemesis secondary to marijuana, gastroenteritis, GERD  ED COURSE  After 1 liter of IV fluids, pepcid, and zofran patient states that he feels better and would like to go home. He is concerned that the symptoms will return before the  pharmacy opens in the morning. Zofran ODT will be given prior to discharge.  I have advised him that he needs to stop smoking marijuana in order for the nausea and abdominal pain to resolve. He is to follow up with PCP or return to the ER for symptoms of concern. ____________________________________________   FINAL CLINICAL IMPRESSION(S) / ED DIAGNOSES  Final diagnoses:  Cannabinoid hyperemesis syndrome     ED Discharge Orders         Ordered    ondansetron (ZOFRAN-ODT) 4 MG disintegrating tablet  Every 8 hours PRN     05/03/19 1927           Note:  This document was prepared using Dragon voice recognition software and may include unintentional dictation errors.   Victorino Dike, FNP 05/03/19 1939    Nena Polio, MD 05/04/19 [phone]

## 2019-06-26 ENCOUNTER — Other Ambulatory Visit: Payer: Self-pay

## 2019-06-26 ENCOUNTER — Emergency Department: Payer: Self-pay

## 2019-06-26 ENCOUNTER — Encounter: Payer: Self-pay | Admitting: Emergency Medicine

## 2019-06-26 ENCOUNTER — Emergency Department
Admission: EM | Admit: 2019-06-26 | Discharge: 2019-06-26 | Disposition: A | Discharge status: Home / Self Care | Payer: Self-pay | Attending: Emergency Medicine | Admitting: Emergency Medicine

## 2019-06-26 DIAGNOSIS — I10 Essential (primary) hypertension: Secondary | ICD-10-CM | POA: Insufficient documentation

## 2019-06-26 DIAGNOSIS — Z794 Long term (current) use of insulin: Secondary | ICD-10-CM | POA: Insufficient documentation

## 2019-06-26 DIAGNOSIS — K3184 Gastroparesis: Secondary | ICD-10-CM | POA: Insufficient documentation

## 2019-06-26 DIAGNOSIS — F17210 Nicotine dependence, cigarettes, uncomplicated: Secondary | ICD-10-CM | POA: Insufficient documentation

## 2019-06-26 DIAGNOSIS — E109 Type 1 diabetes mellitus without complications: Secondary | ICD-10-CM | POA: Insufficient documentation

## 2019-06-26 DIAGNOSIS — Z79899 Other long term (current) drug therapy: Secondary | ICD-10-CM | POA: Insufficient documentation

## 2019-06-26 HISTORY — DX: Nausea with vomiting, unspecified: R11.2

## 2019-06-26 HISTORY — DX: Cannabis use, unspecified, uncomplicated: F12.90

## 2019-06-26 HISTORY — DX: Cannabis hyperemesis syndrome: R11.16

## 2019-06-26 LAB — HEPATIC FUNCTION PANEL
ALT: 21 U/L (ref 0–44)
AST: 15 U/L (ref 15–41)
Albumin: 3.7 g/dL (ref 3.5–5.0)
Alkaline Phosphatase: 137 U/L — ABNORMAL HIGH (ref 38–126)
Bilirubin, Direct: 0.1 mg/dL (ref 0.0–0.2)
Indirect Bilirubin: 1.1 mg/dL — ABNORMAL HIGH (ref 0.3–0.9)
Total Bilirubin: 1.2 mg/dL (ref 0.3–1.2)
Total Protein: 7.1 g/dL (ref 6.5–8.1)

## 2019-06-26 LAB — BASIC METABOLIC PANEL
Anion gap: 9 (ref 5–15)
BUN: 11 mg/dL (ref 6–20)
CO2: 28 mmol/L (ref 22–32)
Calcium: 9.2 mg/dL (ref 8.9–10.3)
Chloride: 103 mmol/L (ref 98–111)
Creatinine, Ser: 1.69 mg/dL — ABNORMAL HIGH (ref 0.61–1.24)
GFR calc Af Amer: >60 mL/min (ref >60)
GFR calc non Af Amer: 55 mL/min — ABNORMAL LOW (ref >60)
Glucose, Bld: 230 mg/dL — ABNORMAL HIGH (ref 70–99)
Potassium: 4.3 mmol/L (ref 3.5–5.1)
Sodium: 140 mmol/L (ref 135–145)

## 2019-06-26 LAB — LIPASE, BLOOD: Lipase: 22 U/L (ref 11–51)

## 2019-06-26 LAB — CBC
HCT: 33.6 % — ABNORMAL LOW (ref 39.0–52.0)
Hemoglobin: 11 g/dL — ABNORMAL LOW (ref 13.0–17.0)
MCH: 29.6 pg (ref 26.0–34.0)
MCHC: 32.7 g/dL (ref 30.0–36.0)
MCV: 90.3 fL (ref 80.0–100.0)
Platelets: 254 10*3/uL (ref 150–400)
RBC: 3.72 MIL/uL — ABNORMAL LOW (ref 4.22–5.81)
RDW: 11.9 % (ref 11.5–15.5)
WBC: 4.6 10*3/uL (ref 4.0–10.5)
nRBC: 0 % (ref 0.0–0.2)

## 2019-06-26 LAB — TROPONIN I (HIGH SENSITIVITY): Troponin I (High Sensitivity): 7 ng/L (ref <18)

## 2019-06-26 MED ORDER — PROMETHAZINE HCL 25 MG PO TABS: 25.0000 mg | ORAL_TABLET | Freq: Four times a day (QID) | ORAL | 0 refills | Status: DC | PRN

## 2019-06-26 MED ORDER — DROPERIDOL 2.5 MG/ML IJ SOLN: 2.5000 mg | Freq: Once | INTRAMUSCULAR | Status: DC

## 2019-06-26 MED ORDER — HALOPERIDOL LACTATE 5 MG/ML IJ SOLN: 2.5000 mg | Freq: Once | INTRAMUSCULAR | Status: DC

## 2019-06-26 MED ORDER — LACTATED RINGERS IV BOLUS: 1000.0000 mL | Freq: Once | INTRAVENOUS | Status: DC

## 2019-06-26 MED ORDER — HALOPERIDOL LACTATE 5 MG/ML IJ SOLN
2.5000 mg | Freq: Once | INTRAMUSCULAR | Status: AC
  Administered 2019-06-26: 2.5 mg via INTRAMUSCULAR
  Filled 2019-06-26: qty 1

## 2019-06-26 NOTE — ED Triage Notes (Signed)
Pt presents to ED via POV c/o pain across upper chest x2 days. Pt states pain feels similar to previous bouts of gastroparesis. Denies known covid contacts.

## 2019-06-26 NOTE — ED Notes (Addendum)
Patient c/o epigastric pain X3 days with N/v and not being able to keep some foods/liquids down. Reports he has had this same problem multiple times. Has been told he has cyclic vomiting syndrome. Patient smells of marijuana

## 2019-06-26 NOTE — ED Provider Notes (Signed)
[facility] Emergency Department Provider Note  ____________________________________________   First MD Initiated Contact with Patient 06/26/19 [phone]     (approximate)  I have reviewed the triage vital signs and the nursing notes.   HISTORY  Chief Complaint Chest Pain    HPI Jonathan Mcmillan is a 24 y.o. male  With PMHx T1DM, cannabis hyperemesis, here with nausea, vomiting. Pt states that since Tuesday, he has had progressively worsening nausea, vomiting, and fatigue. He states the sx began gradually and have progressively worsened. His pain is intermittent, epigastric, and associated with NBNB emesis. He's had difficulty keeping any food down due to this. No fever, chills. No diarrhea or constipation. He does admit to [facility] use, denies any recent food changes. No alleviating factors other than rest and not eating.       Past Medical History:  Diagnosis Date  . Cannabinoid hyperemesis syndrome   . Diabetes 1.5, managed as type 1 ([place])   . Perirectal abscess 06/16/2017    Patient Active Problem List   Diagnosis Date Noted  . Abdominal pain 09/09/2018  . Right arm cellulitis 01/26/2018  . Sepsis ([place]) 03/06/2017  . Malnutrition of moderate degree 05/22/2016  . DKA (diabetic ketoacidoses) ([place]) 05/21/2016  . Type I (juvenile type) diabetes mellitus without mention of complication, uncontrolled 10/05/2010  . Hypertension 10/05/2010  . Obesity 10/05/2010    Past Surgical History:  Procedure Laterality Date  . INCISION AND DRAINAGE PERIRECTAL ABSCESS N/A 06/16/2017   Procedure: IRRIGATION AND DEBRIDEMENT PERIRECTAL ABSCESS;  Surgeon: Florene Glen, MD;  Location: [facility] ORS;  Service: General;  Laterality: N/A;  . none      Prior to Admission medications   Medication Sig Start Date End Date Taking? Authorizing Provider  insulin aspart protamine- aspart (NOVOLOG MIX 70/30) (70-30) 100 UNIT/ML injection Inject 0.2 mLs (20 Units total) into the skin 2  (two) times daily with a meal. 09/10/18  Yes Sudini, Srikar, MD  insulin glargine (LANTUS) 100 UNIT/ML injection Inject 30 Units into the skin at bedtime.   Yes [provider]  lisinopril (ZESTRIL) 40 MG tablet Take 1 tablet (40 mg total) by mouth daily. 09/11/18  Yes Sudini, Alveta Heimlich, MD  amoxicillin (AMOXIL) 500 MG capsule Take 1 capsule (500 mg total) by mouth 3 (three) times daily. Patient not taking: Reported on 06/26/2019 12/20/18   Versie Starks, PA-C  blood glucose meter kit and supplies KIT Dispense based on patient and insurance preference. Use up to four times daily as directed. (FOR ICD-9 250.00, 250.01). 09/10/18   Hillary Bow, MD  naproxen (NAPROSYN) 500 MG tablet Take 1 tablet (500 mg total) by mouth 2 (two) times daily with a meal. Patient not taking: Reported on 12/10/2018 09/22/18   Lavonia Drafts, MD  naproxen (NAPROSYN) 500 MG tablet Take 1 tablet (500 mg total) by mouth 2 (two) times daily with a meal. Patient not taking: Reported on 12/10/2018 09/24/18   Lavonia Drafts, MD  ondansetron (ZOFRAN-ODT) 4 MG disintegrating tablet Take 1 tablet (4 mg total) by mouth every 8 (eight) hours as needed for nausea or vomiting. 05/03/19   Triplett, Cari B, FNP  pantoprazole (PROTONIX) 40 MG tablet Take 1 tablet (40 mg total) by mouth daily. Patient not taking: Reported on 12/10/2018 09/10/18   Hillary Bow, MD  promethazine (PHENERGAN) 25 MG tablet Take 1 tablet (25 mg total) by mouth every 6 (six) hours as needed for nausea or vomiting. 06/26/19   Jonathan Bruce, MD  sucralfate (  CARAFATE) 1 g tablet Take 1 tablet (1 g total) by mouth 4 (four) times daily. Patient not taking: Reported on 06/26/2019 02/04/19   Nance Pear, MD    Allergies Patient has no known allergies.  Family History  Problem Relation Age of Onset  . Diabetes Mother     Social History Social History   Tobacco Use  . Smoking status: Current Every Day Smoker    Packs/day: 0.50    Types: Cigarettes  .  Smokeless tobacco: Never Used  Substance Use Topics  . Alcohol use: No  . Drug use: Yes    Types: Marijuana    Review of Systems  Review of Systems  Constitutional: Positive for fatigue. Negative for chills and fever.  HENT: Negative for sore throat.   Respiratory: Negative for shortness of breath.   Cardiovascular: Negative for chest pain.  Gastrointestinal: Positive for nausea and vomiting. Negative for abdominal pain.  Genitourinary: Negative for flank pain.  Musculoskeletal: Negative for neck pain.  Skin: Negative for rash and wound.  Allergic/Immunologic: Negative for immunocompromised state.  Neurological: Positive for weakness. Negative for numbness.  Hematological: Does not bruise/bleed easily.  All other systems reviewed and are negative.    ____________________________________________  PHYSICAL EXAM:      VITAL SIGNS: ED Triage Vitals  Enc Vitals Group     BP 06/26/19 0821 (!) 143/92     Pulse Rate 06/26/19 0821 (!) 106     Resp 06/26/19 0821 18     Temp 06/26/19 0821 98.2 F (36.8 C)     Temp Source 06/26/19 0821 Oral     SpO2 06/26/19 0821 100 %     Weight 06/26/19 0823 155 lb (70.3 kg)     Height 06/26/19 0823 5' 10"  (1.778 m)     Head Circumference --      Peak Flow --      Pain Score 06/26/19 0823 8     Pain Loc --      Pain Edu? --      Excl. in [place]? --      Physical Exam Vitals and nursing note reviewed.  Constitutional:      General: He is not in acute distress.    Appearance: He is well-developed.  HENT:     Head: Normocephalic and atraumatic.  Eyes:     Conjunctiva/sclera: Conjunctivae normal.  Cardiovascular:     Rate and Rhythm: Regular rhythm. Tachycardia present.     Heart sounds: Normal heart sounds. No murmur. No friction rub.  Pulmonary:     Effort: Pulmonary effort is normal. No respiratory distress.     Breath sounds: Normal breath sounds. No wheezing or rales.  Abdominal:     General: There is no distension.     Palpations:  Abdomen is soft.     Tenderness: There is no abdominal tenderness.  Musculoskeletal:     Cervical back: Neck supple.  Skin:    General: Skin is warm.     Capillary Refill: Capillary refill takes less than 2 seconds.  Neurological:     Mental Status: He is alert and oriented to person, place, and time.     Motor: No abnormal muscle tone.       ____________________________________________   LABS (all labs ordered are listed, but only abnormal results are displayed)  Labs Reviewed  BASIC METABOLIC PANEL - Abnormal; Notable for the following components:      Result Value   Glucose, Bld 230 (*)    Creatinine,  Ser 1.69 (*)    GFR calc non Af Amer 55 (*)    All other components within normal limits  CBC - Abnormal; Notable for the following components:   RBC 3.72 (*)    Hemoglobin 11.0 (*)    HCT 33.6 (*)    All other components within normal limits  HEPATIC FUNCTION PANEL - Abnormal; Notable for the following components:   Alkaline Phosphatase 137 (*)    Indirect Bilirubin 1.1 (*)    All other components within normal limits  LIPASE, BLOOD  TROPONIN I (HIGH SENSITIVITY)  TROPONIN I (HIGH SENSITIVITY)    ____________________________________________  EKG: Sinus tachycardia, VR 105. PR 140, QRS 78, QTc 457. No acute ST elevations or depressions. TWI in aVL, nonspecific. ________________________________________  RADIOLOGY All imaging, including plain films, CT scans, and ultrasounds, independently reviewed by me, and interpretations confirmed via formal radiology reads.  ED MD interpretation:   CXR: Clear  Official radiology report(s): DG Chest 2 View  Result Date: 06/26/2019 CLINICAL DATA:  Chest pain EXAM: CHEST - 2 VIEW COMPARISON:  May 03, 2019 FINDINGS: Lungs are clear. Heart size and pulmonary vascularity are normal. No adenopathy. No pneumothorax. No bone lesions. IMPRESSION: No abnormality noted. Electronically Signed   By: Lowella Grip III M.D.   On:  06/26/2019 08:47    ____________________________________________  PROCEDURES   Procedure(s) performed (including Critical Care):  Procedures  ____________________________________________  INITIAL IMPRESSION / MDM / [place] / ED COURSE  As part of my medical decision making, I reviewed the following data within the [place] notes reviewed and incorporated, Old chart reviewed, Notes from prior ED visits, and [place] Controlled Substance Database       *Jonathan Mcmillan was evaluated in Emergency Department on 06/26/2019 for the symptoms described in the history of present illness. He was evaluated in the context of the global COVID-19 pandemic, which necessitated consideration that the patient might be at risk for infection with the SARS-CoV-2 virus that causes COVID-19. Institutional protocols and algorithms that pertain to the evaluation of patients at risk for COVID-19 are in a state of rapid change based on information released by regulatory bodies including the CDC and federal and state organizations. These policies and algorithms were followed during the patient's care in the ED.  Some ED evaluations and interventions may be delayed as a result of limited staffing during the pandemic.*  Clinical Course as of Jun 25 1122  Fri Jun 26, 2019  0953 24 yo M here with n/v and epigastric pain. H/o T1.5DM and recurrent gastroparesis vs THC-induced emesis. Abdomen soft, NT, ND and VSS. Will give antiemetics, fluids, re-assess.   [CI]  1224 Labs are reassuring. Glu 230, normal AG and CO2 - doubt DKA. Cr mildly elevated but near baseline.   [CI]    Clinical Course User Index [CI] Jonathan Bruce, MD    Medical Decision Making:  As above. Feeling better in ED and tolerating PO. Well appearing. Abdomen is soft, NT, ND. Suspect cannabis hyperemesis vs gastroparesis. D/c home.  ____________________________________________  FINAL CLINICAL IMPRESSION(S) / ED  DIAGNOSES  Final diagnoses:  Gastroparesis     MEDICATIONS GIVEN DURING THIS VISIT:  Medications  haloperidol lactate (HALDOL) injection 2.5 mg (2.5 mg Intramuscular Given 06/26/19 1120)     ED Discharge Orders         Ordered    promethazine (PHENERGAN) 25 MG tablet  Every 6 hours PRN     06/26/19 1123  Note:  This document was prepared using Dragon voice recognition software and may include unintentional dictation errors.   Jonathan Bruce, MD 06/26/19 1124

## 2019-07-23 ENCOUNTER — Emergency Department: Payer: Self-pay

## 2019-07-23 ENCOUNTER — Emergency Department
Admission: EM | Admit: 2019-07-23 | Discharge: 2019-07-23 | Disposition: A | Discharge status: Home / Self Care | Payer: Self-pay | Attending: Emergency Medicine | Admitting: Emergency Medicine

## 2019-07-23 ENCOUNTER — Other Ambulatory Visit: Payer: Self-pay

## 2019-07-23 DIAGNOSIS — I10 Essential (primary) hypertension: Secondary | ICD-10-CM | POA: Insufficient documentation

## 2019-07-23 DIAGNOSIS — K3184 Gastroparesis: Secondary | ICD-10-CM

## 2019-07-23 DIAGNOSIS — Z79899 Other long term (current) drug therapy: Secondary | ICD-10-CM | POA: Insufficient documentation

## 2019-07-23 DIAGNOSIS — F17210 Nicotine dependence, cigarettes, uncomplicated: Secondary | ICD-10-CM | POA: Insufficient documentation

## 2019-07-23 DIAGNOSIS — R112 Nausea with vomiting, unspecified: Secondary | ICD-10-CM

## 2019-07-23 DIAGNOSIS — E1143 Type 2 diabetes mellitus with diabetic autonomic (poly)neuropathy: Secondary | ICD-10-CM

## 2019-07-23 DIAGNOSIS — Z794 Long term (current) use of insulin: Secondary | ICD-10-CM | POA: Insufficient documentation

## 2019-07-23 DIAGNOSIS — E1043 Type 1 diabetes mellitus with diabetic autonomic (poly)neuropathy: Secondary | ICD-10-CM | POA: Insufficient documentation

## 2019-07-23 LAB — GLUCOSE, CAPILLARY: Glucose-Capillary: 247 mg/dL — ABNORMAL HIGH (ref 70–99)

## 2019-07-23 LAB — COMPREHENSIVE METABOLIC PANEL
ALT: 17 U/L (ref 0–44)
AST: 12 U/L — ABNORMAL LOW (ref 15–41)
Albumin: 4.1 g/dL (ref 3.5–5.0)
Alkaline Phosphatase: 122 U/L (ref 38–126)
Anion gap: 8 (ref 5–15)
BUN: 17 mg/dL (ref 6–20)
CO2: 28 mmol/L (ref 22–32)
Calcium: 9.4 mg/dL (ref 8.9–10.3)
Chloride: 98 mmol/L (ref 98–111)
Creatinine, Ser: 1.58 mg/dL — ABNORMAL HIGH (ref 0.61–1.24)
GFR calc Af Amer: >60 mL/min (ref >60)
GFR calc non Af Amer: 60 mL/min — ABNORMAL LOW (ref >60)
Glucose, Bld: 300 mg/dL — ABNORMAL HIGH (ref 70–99)
Potassium: 4.2 mmol/L (ref 3.5–5.1)
Sodium: 134 mmol/L — ABNORMAL LOW (ref 135–145)
Total Bilirubin: 0.8 mg/dL (ref 0.3–1.2)
Total Protein: 7.5 g/dL (ref 6.5–8.1)

## 2019-07-23 LAB — CBC
HCT: 34.2 % — ABNORMAL LOW (ref 39.0–52.0)
Hemoglobin: 11.4 g/dL — ABNORMAL LOW (ref 13.0–17.0)
MCH: 30 pg (ref 26.0–34.0)
MCHC: 33.3 g/dL (ref 30.0–36.0)
MCV: 90 fL (ref 80.0–100.0)
Platelets: 219 10*3/uL (ref 150–400)
RBC: 3.8 MIL/uL — ABNORMAL LOW (ref 4.22–5.81)
RDW: 11.3 % — ABNORMAL LOW (ref 11.5–15.5)
WBC: 4.4 10*3/uL (ref 4.0–10.5)
nRBC: 0 % (ref 0.0–0.2)

## 2019-07-23 LAB — LIPASE, BLOOD: Lipase: 29 U/L (ref 11–51)

## 2019-07-23 MED ORDER — ONDANSETRON 4 MG PO TBDP
4.0000 mg | ORAL_TABLET | Freq: Once | ORAL | Status: AC
  Administered 2019-07-23: 4 mg via ORAL
  Filled 2019-07-23: qty 1

## 2019-07-23 MED ORDER — ONDANSETRON 4 MG PO TBDP: 4.0000 mg | ORAL_TABLET | Freq: Four times a day (QID) | ORAL | 0 refills | Status: DC | PRN

## 2019-07-23 MED ORDER — SODIUM CHLORIDE 0.9 % IV BOLUS
1000.0000 mL | Freq: Once | INTRAVENOUS | Status: AC
  Administered 2019-07-23: 1000 mL via INTRAVENOUS

## 2019-07-23 MED ORDER — SODIUM CHLORIDE 0.9% FLUSH: 3.0000 mL | Freq: Once | INTRAVENOUS | Status: DC

## 2019-07-23 MED ORDER — ONDANSETRON HCL 4 MG/2ML IJ SOLN
4.0000 mg | Freq: Once | INTRAMUSCULAR | Status: AC
  Administered 2019-07-23: 4 mg via INTRAVENOUS
  Filled 2019-07-23: qty 2

## 2019-07-23 NOTE — ED Notes (Signed)
Pt states he is having pain in his abdomen with nausea. Pt has been here before for same issue.  Pt requesting  a prescription for Zofran. Pain 10/10.

## 2019-07-23 NOTE — ED Notes (Signed)
Per Dr Jacqualine Code, pt given a cup of ice water at this time to assess pt's ability to tolerate PO intake.

## 2019-07-23 NOTE — ED Notes (Signed)
Pt to Xray.

## 2019-07-23 NOTE — ED Provider Notes (Signed)
[facility] Emergency Department Provider Note   ____________________________________________   First MD Initiated Contact with Patient 07/23/19 1729     (approximate)  I have reviewed the triage vital signs and the nursing notes.   HISTORY  Chief Complaint Abdominal Pain    HPI Jonathan Mcmillan is a 24 y.o. male here for evaluation of "gastroparesis"  Patient reports that he will frequently get nausea and vomiting.  Occurring about once a month sometimes lasting a couple of days.  Reports the symptoms occur seemingly at random, start eating stress to feel fullness and then will progress to episodes of nausea with vomiting.  A little bit of abdominal pain, but primarily nausea and just cannot keep food in his stomach  This happened several times in the past.  Reports same is been happening the last 2 days.  Normally takes medication, reports he takes prescription Zofran which usually helps but he is run out of.  He is also an insulin-dependent diabetic, continues to use an insulin about once or twice a day primarily with meals based on sliding scale  No fevers or chills.  No body aches.  No known Covid exposure no chest pain or trouble breathing. Past Medical History:  Diagnosis Date  . Cannabinoid hyperemesis syndrome   . Diabetes 1.5, managed as type 1 ([place])   . Perirectal abscess 06/16/2017    Patient Active Problem List   Diagnosis Date Noted  . Abdominal pain 09/09/2018  . Right arm cellulitis 01/26/2018  . Sepsis ([place]) 03/06/2017  . Malnutrition of moderate degree 05/22/2016  . DKA (diabetic ketoacidoses) ([place]) 05/21/2016  . Type I (juvenile type) diabetes mellitus without mention of complication, uncontrolled 10/05/2010  . Hypertension 10/05/2010  . Obesity 10/05/2010    Past Surgical History:  Procedure Laterality Date  . INCISION AND DRAINAGE PERIRECTAL ABSCESS N/A 06/16/2017   Procedure: IRRIGATION AND DEBRIDEMENT PERIRECTAL ABSCESS;   Surgeon: Florene Glen, MD;  Location: [facility] ORS;  Service: General;  Laterality: N/A;  . none      Prior to Admission medications   Medication Sig Start Date End Date Taking? Authorizing Provider  amoxicillin (AMOXIL) 500 MG capsule Take 1 capsule (500 mg total) by mouth 3 (three) times daily. Patient not taking: Reported on 06/26/2019 12/20/18   Versie Starks, PA-C  blood glucose meter kit and supplies KIT Dispense based on patient and insurance preference. Use up to four times daily as directed. (FOR ICD-9 250.00, 250.01). 09/10/18   Hillary Bow, MD  insulin aspart protamine- aspart (NOVOLOG MIX 70/30) (70-30) 100 UNIT/ML injection Inject 0.2 mLs (20 Units total) into the skin 2 (two) times daily with a meal. 09/10/18   Sudini, Srikar, MD  insulin glargine (LANTUS) 100 UNIT/ML injection Inject 30 Units into the skin at bedtime.    [provider]  lisinopril (ZESTRIL) 40 MG tablet Take 1 tablet (40 mg total) by mouth daily. 09/11/18   Hillary Bow, MD  naproxen (NAPROSYN) 500 MG tablet Take 1 tablet (500 mg total) by mouth 2 (two) times daily with a meal. Patient not taking: Reported on 12/10/2018 09/22/18   Lavonia Drafts, MD  naproxen (NAPROSYN) 500 MG tablet Take 1 tablet (500 mg total) by mouth 2 (two) times daily with a meal. Patient not taking: Reported on 12/10/2018 09/24/18   Lavonia Drafts, MD  ondansetron (ZOFRAN ODT) 4 MG disintegrating tablet Take 1 tablet (4 mg total) by mouth every 6 (six) hours as needed for nausea or vomiting.  07/23/19   Delman Kitten, MD  pantoprazole (PROTONIX) 40 MG tablet Take 1 tablet (40 mg total) by mouth daily. Patient not taking: Reported on 12/10/2018 09/10/18   Hillary Bow, MD  promethazine (PHENERGAN) 25 MG tablet Take 1 tablet (25 mg total) by mouth every 6 (six) hours as needed for nausea or vomiting. 06/26/19   Duffy Bruce, MD  sucralfate (CARAFATE) 1 g tablet Take 1 tablet (1 g total) by mouth 4 (four) times daily. Patient not taking:  Reported on 06/26/2019 02/04/19   Nance Pear, MD    Allergies Patient has no known allergies.  Family History  Problem Relation Age of Onset  . Diabetes Mother     Social History Social History   Tobacco Use  . Smoking status: Current Every Day Smoker    Packs/day: 0.50    Types: Cigarettes  . Smokeless tobacco: Never Used  Substance Use Topics  . Alcohol use: No  . Drug use: Yes    Types: Marijuana    Review of Systems Constitutional: No fever/chills denies lightheadedness or weakness Eyes: No visual changes. ENT: No sore throat. Cardiovascular: Denies chest pain. Respiratory: Denies shortness of breath. Gastrointestinal: See HPI.  Mild abdominal pain, not in any one particular spot.  Reports primarily feeling nauseated occasional vomiting of nonbloody emesis.  Some flatus Genitourinary: Negative for dysuria. Musculoskeletal: Negative for back pain. Skin: Negative for rash. Neurological: Negative for headaches, areas of focal weakness or numbness.    ____________________________________________   PHYSICAL EXAM:  VITAL SIGNS: ED Triage Vitals  Enc Vitals Group     BP 07/23/19 1528 (!) 151/106     Pulse Rate 07/23/19 1528 (!) 102     Resp 07/23/19 1528 17     Temp 07/23/19 1528 97.9 F (36.6 C)     Temp src --      SpO2 07/23/19 1528 100 %     Weight 07/23/19 1529 160 lb (72.6 kg)     Height 07/23/19 1529 5' 10"  (1.778 m)     Head Circumference --      Peak Flow --      Pain Score 07/23/19 1529 10     Pain Loc --      Pain Edu? --      Excl. in [place]? --     Constitutional: Alert and oriented. Well appearing and in no acute distress.  He is quite pleasant and in no acute distress. Eyes: Conjunctivae are normal. Head: Atraumatic. Nose: No congestion/rhinnorhea. Mouth/Throat: Mucous membranes are slightly dry. Neck: No stridor.  Cardiovascular: Normal rate, regular rhythm. Grossly normal heart sounds.  Good peripheral circulation. Respiratory:  Normal respiratory effort.  No retractions. Lungs CTAB. Gastrointestinal: Soft and she is mild tenderness nonfocally with no rebound or guarding.  No peritonitis.  No focal abdominal pain.  No pain McBurney's point.  Negative Murphy.. No distention. Musculoskeletal: No lower extremity tenderness nor edema. Neurologic:  Normal speech and language. No gross focal neurologic deficits are appreciated.  Skin:  Skin is warm, dry and intact. No rash noted. Psychiatric: Mood and affect are normal. Speech and behavior are normal.  ____________________________________________   LABS (all labs ordered are listed, but only abnormal results are displayed)  Labs Reviewed  COMPREHENSIVE METABOLIC PANEL - Abnormal; Notable for the following components:      Result Value   Sodium 134 (*)    Glucose, Bld 300 (*)    Creatinine, Ser 1.58 (*)    AST 12 (*)  GFR calc non Af Amer 60 (*)    All other components within normal limits  CBC - Abnormal; Notable for the following components:   RBC 3.80 (*)    Hemoglobin 11.4 (*)    HCT 34.2 (*)    RDW 11.3 (*)    All other components within normal limits  GLUCOSE, CAPILLARY - Abnormal; Notable for the following components:   Glucose-Capillary 247 (*)    All other components within normal limits  LIPASE, BLOOD  URINALYSIS, COMPLETE (UACMP) WITH MICROSCOPIC  CBG MONITORING, ED   ____________________________________________  EKG   ____________________________________________  RADIOLOGY  DG Abd 2 Views  Result Date: 07/23/2019 CLINICAL DATA:  Vomiting. Nausea. Abdominal pain. Question gastroparesis. EXAM: ABDOMEN - 2 VIEW COMPARISON:  Abdominal radiographs 09/09/2018. Noncontrast CT 05/24/2018 FINDINGS: The bowel gas pattern is normal. There is no evidence of free air. No bowel dilatation to suggest obstruction. No abnormal gastric distension. Small volume of colonic stool. No radiopaque calculi or abnormal soft tissue calcifications. Unremarkable  osseous structures. Included lung bases are clear. IMPRESSION: Negative abdominal radiographs. Normal bowel gas pattern. Electronically Signed   By: Keith Rake M.D.   On: 07/23/2019 18:34    Imaging reviewed negative for acute obstructive findings ____________________________________________   PROCEDURES  Procedure(s) performed: None  Procedures  Critical Care performed: No  ____________________________________________   INITIAL IMPRESSION / ASSESSMENT AND PLAN / ED COURSE  Pertinent labs & imaging results that were available during my care of the patient were reviewed by me and considered in my medical decision making (see chart for details).   Differential diagnosis includes but is not limited to, abdominal perforation, aortic dissection, cholecystitis, appendicitis, diverticulitis, colitis, esophagitis/gastritis, kidney stone, pyelonephritis, urinary tract infection, aortic aneurysm. All are considered in decision and treatment plan. Based upon the patient's presentation and risk factors, and his previous history and his description it seems likely gastroparesis.  He reports good improvement with hydration and antiemetics.  We will start with Zofran which he reports was worked in the past as well as fluids.  Somewhat hyperglycemic, will monitor this as we hydrate him.  No evidence of DKA.  Reassuring lab work at this time.   ----------------------------------------- 8:00 PM on 07/23/2019 -----------------------------------------  Patient resting in no distress.  Will recheck glucose now.  No ongoing vomiting.  Reassuring evaluation.    ----------------------------------------- 8:20 PM on 07/23/2019 -----------------------------------------  Patient reports improvement, tolerating drinking water by mouth now.  Would like prescription for Zofran to go home with, will also provide a tablet here prior to leaving so he has antiemetic in case of ongoing symptoms this  evening.  Patient requesting ready for discharge, appears well in no acute distress  Return precautions and treatment recommendations and follow-up discussed with the patient who is agreeable with the plan.   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

## 2019-07-23 NOTE — ED Triage Notes (Signed)
Pt comes via POV from home with c/o abdominal pain and nausea. Pt states this started 2 days ago. Pt states severe pain 10/10.  Pt states hx of gastroparesis

## 2019-08-01 ENCOUNTER — Other Ambulatory Visit: Payer: Self-pay

## 2019-08-01 ENCOUNTER — Encounter: Payer: Self-pay | Admitting: Emergency Medicine

## 2019-08-01 DIAGNOSIS — F17210 Nicotine dependence, cigarettes, uncomplicated: Secondary | ICD-10-CM | POA: Insufficient documentation

## 2019-08-01 DIAGNOSIS — K3184 Gastroparesis: Secondary | ICD-10-CM | POA: Insufficient documentation

## 2019-08-01 DIAGNOSIS — E860 Dehydration: Secondary | ICD-10-CM | POA: Insufficient documentation

## 2019-08-01 DIAGNOSIS — I10 Essential (primary) hypertension: Secondary | ICD-10-CM | POA: Insufficient documentation

## 2019-08-01 DIAGNOSIS — E109 Type 1 diabetes mellitus without complications: Secondary | ICD-10-CM | POA: Insufficient documentation

## 2019-08-01 DIAGNOSIS — Z79899 Other long term (current) drug therapy: Secondary | ICD-10-CM | POA: Insufficient documentation

## 2019-08-01 LAB — CBC
HCT: 33.1 % — ABNORMAL LOW (ref 39.0–52.0)
Hemoglobin: 11 g/dL — ABNORMAL LOW (ref 13.0–17.0)
MCH: 29.9 pg (ref 26.0–34.0)
MCHC: 33.2 g/dL (ref 30.0–36.0)
MCV: 89.9 fL (ref 80.0–100.0)
Platelets: 225 10*3/uL (ref 150–400)
RBC: 3.68 MIL/uL — ABNORMAL LOW (ref 4.22–5.81)
RDW: 11.2 % — ABNORMAL LOW (ref 11.5–15.5)
WBC: 5.4 10*3/uL (ref 4.0–10.5)
nRBC: 0 % (ref 0.0–0.2)

## 2019-08-01 LAB — COMPREHENSIVE METABOLIC PANEL
ALT: 15 U/L (ref 0–44)
AST: 12 U/L — ABNORMAL LOW (ref 15–41)
Albumin: 3.9 g/dL (ref 3.5–5.0)
Alkaline Phosphatase: 116 U/L (ref 38–126)
Anion gap: 8 (ref 5–15)
BUN: 21 mg/dL — ABNORMAL HIGH (ref 6–20)
CO2: 28 mmol/L (ref 22–32)
Calcium: 9 mg/dL (ref 8.9–10.3)
Chloride: 99 mmol/L (ref 98–111)
Creatinine, Ser: 1.82 mg/dL — ABNORMAL HIGH (ref 0.61–1.24)
GFR calc Af Amer: 59 mL/min — ABNORMAL LOW (ref >60)
GFR calc non Af Amer: 50 mL/min — ABNORMAL LOW (ref >60)
Glucose, Bld: 267 mg/dL — ABNORMAL HIGH (ref 70–99)
Potassium: 3.9 mmol/L (ref 3.5–5.1)
Sodium: 135 mmol/L (ref 135–145)
Total Bilirubin: 1.2 mg/dL (ref 0.3–1.2)
Total Protein: 7.1 g/dL (ref 6.5–8.1)

## 2019-08-01 LAB — LIPASE, BLOOD: Lipase: 69 U/L — ABNORMAL HIGH (ref 11–51)

## 2019-08-01 NOTE — ED Triage Notes (Signed)
Patient with complaint of generalized abdominal pain, vomiting and diarrhea times two days.

## 2019-08-02 ENCOUNTER — Emergency Department
Admission: EM | Admit: 2019-08-02 | Discharge: 2019-08-02 | Disposition: A | Discharge status: Home / Self Care | Payer: Self-pay | Attending: Emergency Medicine | Admitting: Emergency Medicine

## 2019-08-02 ENCOUNTER — Encounter: Payer: Self-pay | Admitting: Radiology

## 2019-08-02 ENCOUNTER — Emergency Department: Payer: Self-pay

## 2019-08-02 DIAGNOSIS — K3184 Gastroparesis: Secondary | ICD-10-CM

## 2019-08-02 DIAGNOSIS — E860 Dehydration: Secondary | ICD-10-CM

## 2019-08-02 MED ORDER — INSULIN ASPART PROT & ASPART (70-30 MIX) 100 UNIT/ML ~~LOC~~ SUSP: 20.0000 [IU] | Freq: Two times a day (BID) | SUBCUTANEOUS | 11 refills | Status: DC

## 2019-08-02 MED ORDER — MORPHINE SULFATE (PF) 4 MG/ML IV SOLN
4.0000 mg | Freq: Once | INTRAVENOUS | Status: AC
  Administered 2019-08-02: 4 mg via INTRAVENOUS
  Filled 2019-08-02: qty 1

## 2019-08-02 MED ORDER — BLOOD GLUCOSE MONITOR KIT: PACK | 0 refills | Status: DC

## 2019-08-02 MED ORDER — DROPERIDOL 2.5 MG/ML IJ SOLN
2.5000 mg | Freq: Once | INTRAMUSCULAR | Status: AC
  Administered 2019-08-02: 2.5 mg via INTRAVENOUS
  Filled 2019-08-02: qty 2

## 2019-08-02 MED ORDER — SODIUM CHLORIDE 0.9 % IV BOLUS
1000.0000 mL | Freq: Once | INTRAVENOUS | Status: AC
  Administered 2019-08-02: 1000 mL via INTRAVENOUS

## 2019-08-02 MED ORDER — IOHEXOL 300 MG/ML  SOLN
100.0000 mL | Freq: Once | INTRAMUSCULAR | Status: AC | PRN
  Administered 2019-08-02: 100 mL via INTRAVENOUS

## 2019-08-02 NOTE — ED Notes (Addendum)
Pt c/o epigastric pain since Wednesday night pt reports sleeping, "it just comes on", reports dx of "gastroparosis" last week  Pt given warm blanket reports hx of diabetes and denies checking CBG today but took "all the insulin I had" which was 10 units of novalog  Pt denies ETOH or any drug use, pt requesting morphine for pain

## 2019-08-02 NOTE — ED Notes (Signed)
Pt to CT att

## 2019-08-02 NOTE — ED Notes (Signed)
Pt sleeping at this time in lobby in [place].

## 2019-08-02 NOTE — ED Provider Notes (Signed)
[facility] Emergency Department Provider Note  ____________________________________________   First MD Initiated Contact with Patient 08/02/19 0222     (approximate)  I have reviewed the triage vital signs and the nursing notes.   HISTORY  Chief Complaint Abdominal Pain    HPI Jonathan Mcmillan is a 24 y.o. 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   He presents for gradually worsening symptoms of generalized abdominal pain, nausea, vomiting, and diarrhea over the last 2 days.  He said he is inconsistent with his insulin because his uncle uses insulin to intakes his.  He has not been to see his primary care doctor recently.  He says that he smokes marijuana pretty much every day.  Nothing in particular makes his symptoms better or worse.  He is thirsty and reporting severe pain.  He said it feels similar to prior episodes of gastroparesis.  He says he has never been told in the past about cannabinoid hyperemesis syndrome.  He denies fever/chills, sore throat, chest pain, shortness of breath, cough.        Past Medical History:  Diagnosis Date  . Cannabinoid hyperemesis syndrome   . Diabetes 1.5, managed as type 1 ([place])   . Perirectal abscess 06/16/2017    Patient Active Problem List   Diagnosis Date Noted  . Abdominal pain 09/09/2018  . Right arm cellulitis 01/26/2018  . Sepsis ([place]) 03/06/2017  . Malnutrition of moderate degree 05/22/2016  . DKA (diabetic ketoacidoses) ([place]) 05/21/2016  . Type I (juvenile type) diabetes mellitus without mention of complication, uncontrolled 10/05/2010  . Hypertension 10/05/2010  . Obesity 10/05/2010    Past Surgical History:  Procedure Laterality Date  . INCISION AND DRAINAGE PERIRECTAL ABSCESS N/A 06/16/2017   Procedure: IRRIGATION AND DEBRIDEMENT PERIRECTAL ABSCESS;  Surgeon: Florene Glen, MD;   Location: [facility] ORS;  Service: General;  Laterality: N/A;  . none      Prior to Admission medications   Medication Sig Start Date End Date Taking? Authorizing Provider  amoxicillin (AMOXIL) 500 MG capsule Take 1 capsule (500 mg total) by mouth 3 (three) times daily. Patient not taking: Reported on 06/26/2019 12/20/18   Versie Starks, PA-C  blood glucose meter kit and supplies KIT Dispense based on patient and insurance preference. Use up to four times daily as directed. (FOR ICD-9 250.00, 250.01). 08/02/19   Hinda Kehr, MD  insulin aspart protamine- aspart (NOVOLOG MIX 70/30) (70-30) 100 UNIT/ML injection Inject 0.2 mLs (20 Units total) into the skin 2 (two) times daily with a meal. 08/02/19   Hinda Kehr, MD  insulin glargine (LANTUS) 100 UNIT/ML injection Inject 30 Units into the skin at bedtime.    [provider]  lisinopril (ZESTRIL) 40 MG tablet Take 1 tablet (40 mg total) by mouth daily. 09/11/18   Hillary Bow, MD  naproxen (NAPROSYN) 500 MG tablet Take 1 tablet (500 mg total) by mouth 2 (two) times daily with a meal. Patient not taking: Reported on 12/10/2018 09/22/18   Lavonia Drafts, MD  naproxen (NAPROSYN) 500 MG tablet Take 1 tablet (500 mg total) by mouth 2 (two) times daily with a meal. Patient not taking: Reported on 12/10/2018 09/24/18   Lavonia Drafts, MD  ondansetron (ZOFRAN ODT) 4 MG disintegrating tablet Take 1 tablet (4 mg total) by mouth every 6 (six) hours as needed for nausea or vomiting. 07/23/19   Delman Kitten, MD  pantoprazole (PROTONIX) 40 MG tablet Take 1 tablet (40 mg total) by mouth daily. Patient not taking: Reported on 12/10/2018 09/10/18   Hillary Bow, MD  promethazine (PHENERGAN) 25 MG tablet Take 1 tablet (25 mg total) by mouth every 6 (six) hours as needed for nausea or vomiting. 06/26/19   Duffy Bruce, MD  sucralfate (CARAFATE) 1 g tablet Take 1 tablet (1 g total) by mouth 4 (four) times daily. Patient not taking: Reported on 06/26/2019 02/04/19    Nance Pear, MD    Allergies Patient has no known allergies.  Family History  Problem Relation Age of Onset  . Diabetes Mother     Social History Social History   Tobacco Use  . Smoking status: Current Every Day Smoker    Packs/day: 0.50    Types: Cigarettes  . Smokeless tobacco: Never Used  Substance Use Topics  . Alcohol use: No  . Drug use: Yes    Types: Marijuana    Review of Systems Constitutional: No fever/chills Eyes: No visual changes. ENT: No sore throat. Cardiovascular: Denies chest pain. Respiratory: Denies shortness of breath. Gastrointestinal: Abdominal pain with persistent nausea and vomiting as well as some loose stools. Genitourinary: Negative for dysuria. Musculoskeletal: Negative for neck pain.  Negative for back pain. Integumentary: Negative for rash. Neurological: Negative for headaches, focal weakness or numbness.   ____________________________________________   PHYSICAL EXAM:  VITAL SIGNS: ED Triage Vitals  Enc Vitals Group     BP 08/01/19 2312 (!) 153/107     Pulse Rate 08/01/19 2312 (!) 102     Resp 08/01/19 2312 18     Temp 08/01/19 2312 97.9 F (36.6 C)     Temp Source 08/01/19 2312 Oral     SpO2 08/01/19 2312 100 %     Weight 08/01/19 2314 72.6 kg (160 lb)     Height 08/01/19 2314 1.778 m (5' 10" )     Head Circumference --      Peak Flow --      Pain Score 08/01/19 2319 10     Pain Loc --      Pain Edu? --      Excl. in [place]? --     Constitutional: Alert and oriented.  Appears uncomfortable. Eyes: Conjunctivae are normal.  Head: Atraumatic. Nose: No congestion/rhinnorhea. Mouth/Throat: Patient is wearing a mask. Neck: No stridor.  No meningeal signs.   Cardiovascular: Normal rate, regular rhythm. Good peripheral circulation. Grossly normal heart sounds. Respiratory: Normal respiratory effort.  No retractions. Gastrointestinal: Soft and nondistended.  Thin body habitus.  No appreciable tenderness to palpation  throughout the abdomen except possibly a little bit of tenderness in the epigastrium.  Negative Murphy sign. Musculoskeletal: No lower extremity tenderness nor edema. No gross deformities of extremities. Neurologic:  Normal speech and language. No gross focal neurologic deficits are appreciated.  Skin:  Skin is warm, dry and intact. Psychiatric: Mood and affect are irritable but generally understandable under the circumstances.  ____________________________________________   LABS (all labs ordered are listed, but only abnormal results are displayed)  Labs Reviewed  LIPASE, BLOOD - Abnormal; Notable for the following components:      Result Value   Lipase 69 (*)    All other components within normal limits  COMPREHENSIVE METABOLIC PANEL - Abnormal; Notable for the following components:   Glucose, Bld 267 (*)    BUN 21 (*)    Creatinine, Ser 1.82 (*)    AST 12 (*)    GFR calc non Af  Amer 50 (*)    GFR calc Af Amer 59 (*)    All other components within normal limits  CBC - Abnormal; Notable for the following components:   RBC 3.68 (*)    Hemoglobin 11.0 (*)    HCT 33.1 (*)    RDW 11.2 (*)    All other components within normal limits  URINALYSIS, COMPLETE (UACMP) WITH MICROSCOPIC   ____________________________________________  EKG  No indication for emergent EKG.  However a review of prior EKGs demonstrates no QTC prolongation. ____________________________________________  RADIOLOGY Ursula Alert, personally viewed and evaluated these images (plain radiographs) as part of my medical decision making, as well as reviewing the written report by the radiologist.  ED MD interpretation: No acute abnormalities identified on right upper quadrant ultrasound nor CT scan of the abdomen and pelvis  Official radiology report(s): CT ABDOMEN PELVIS W CONTRAST  Result Date: 08/02/2019 CLINICAL DATA:  Epigastric pain EXAM: CT ABDOMEN AND PELVIS WITH CONTRAST TECHNIQUE: Multidetector CT  imaging of the abdomen and pelvis was performed using the standard protocol following bolus administration of intravenous contrast. CONTRAST:  185m OMNIPAQUE IOHEXOL 300 MG/ML  SOLN COMPARISON:  05/24/2018 FINDINGS: Lower chest: C Lung bases are clear. No effusions. Heart is normal size. Hepatobiliary: No focal hepatic abnormality. Gallbladder unremarkable. Pancreas: No focal abnormality or ductal dilatation. Spleen: No focal abnormality.  Normal size. Adrenals/Urinary Tract: No adrenal abnormality. No focal renal abnormality. No stones or hydronephrosis. Urinary bladder is unremarkable. Stomach/Bowel: Normal appendix. Stomach, large and small bowel grossly unremarkable. Vascular/Lymphatic: No evidence of aneurysm or adenopathy. Reproductive: No visible focal abnormality. Other: No free fluid or free air. Musculoskeletal: No acute bony abnormality. IMPRESSION: No acute findings in the abdomen or pelvis. Electronically Signed   By: KRolm BaptiseM.D.   On: 08/02/2019 03:47   [place] RUQ  Result Date: 08/02/2019 CLINICAL DATA:  Normal right upper quadrant ultrasound. EXAM: ULTRASOUND ABDOMEN LIMITED RIGHT UPPER QUADRANT COMPARISON:  None. FINDINGS: Gallbladder: No gallstones or wall thickening visualized. No sonographic Murphy sign noted by sonographer. Common bile duct: Diameter: Normal caliber, 6 mm tapering to 4 mm distally. Liver: No focal lesion identified. Within normal limits in parenchymal echogenicity. Portal vein is patent on color Doppler imaging with normal direction of blood flow towards the liver. Other: None. IMPRESSION: Normal right upper quadrant ultrasound. Electronically Signed   By: KRolm BaptiseM.D.   On: 08/02/2019 02:56    ____________________________________________   PROCEDURES   Procedure(s) performed (including Critical Care):  Procedures   ____________________________________________   INITIAL IMPRESSION / MDM / [place]/ ED COURSE  As part of  my medical decision making, I reviewed the following data within the [employer] reviewed and incorporated, Labs reviewed , Old chart reviewed, Notes from prior ED visits and [place] Controlled Substance Database   Differential diagnosis includes, but is not limited to, diabetic gastroparesis, cannabinoid hyperemesis syndrome, other nonspecific cyclic vomiting syndrome, DKA, HHS, acute electrolyte or metabolic abnormality.  The patient's vital signs are normal except for some mild tachycardia.  Lab work is notable for elevated creatinine over baseline at 1.8 although his creatinines in the past have been extremely variable and have been as high or higher in the past.  Baseline seems to be around 1.5.  But I believe that she is likely volume depleted at the moment.  His anion gap is 8 which is reassuring and his CO2 is 28.  No significant electrolyte abnormalities.  However his lipase is 69 which I suspect is related to his acute GI symptoms but could also be reflective of pancreatitis.  CBC is normal.  I ordered a right upper quadrant ultrasound look for signs of biliary disease that could explain acute pancreatitis but the right upper quadrant ultrasound is normal.  Given the patient's comorbidities I am going to order a CT abdomen/pelvis with IV contrast to look for radiographic evidence of pancreatitis.  I treated the patient's symptoms with 1 L normal saline IV bolus and droperidol 2.5 mg IV.  I am also ordering morphine 4 mg IV given the possibility of pancreatitis and the fact that the patient does look quite uncomfortable.  I will reassess after the imaging and the medication treatment.      Clinical Course as of Aug 01 640  Sun Aug 02, 2019  0355 No acute abnormalities identified on CT of the abdomen and pelvis including no radiographic evidence of pancreatitis.  CT ABDOMEN PELVIS W CONTRAST [CF]  [phone] The patient has been sleeping comfortably for several hours.  I  woke him up and ask him how he is doing and if he is better and he said "a little bit".  We will do a p.o. challenge and then anticipate discharge with outpatient follow-up.   [CF]    Clinical Course User Index [CF] Hinda Kehr, MD     ____________________________________________  FINAL CLINICAL IMPRESSION(S) / ED DIAGNOSES  Final diagnoses:  Gastroparesis  Dehydration     MEDICATIONS GIVEN DURING THIS VISIT:  Medications  sodium chloride 0.9 % bolus 1,000 mL (1,000 mLs Intravenous New Bag/Given 08/02/19 0323)  droperidol (INAPSINE) 2.5 MG/ML injection 2.5 mg (2.5 mg Intravenous Given 08/02/19 0323)  morphine 4 MG/ML injection 4 mg (4 mg Intravenous Given 08/02/19 0326)  iohexol (OMNIPAQUE) 300 MG/ML solution 100 mL (100 mLs Intravenous Contrast Given 08/02/19 0335)     ED Discharge Orders         Ordered    insulin aspart protamine- aspart (NOVOLOG MIX 70/30) (70-30) 100 UNIT/ML injection  2 times daily with meals     08/02/19 0641    blood glucose meter kit and supplies KIT     08/02/19 0641          *Please note:  Jonathan Mcmillan was evaluated in Emergency Department on 08/02/2019 for the symptoms described in the history of present illness. He was evaluated in the context of the global COVID-19 pandemic, which necessitated consideration that the patient might be at risk for infection with the SARS-CoV-2 virus that causes COVID-19. Institutional protocols and algorithms that pertain to the evaluation of patients at risk for COVID-19 are in a state of rapid change based on information released by regulatory bodies including the CDC and federal and state organizations. These policies and algorithms were followed during the patient's care in the ED.  Some ED evaluations and interventions may be delayed as a result of limited staffing during the pandemic.*  Note:  This document was prepared using Dragon voice recognition software and may include unintentional dictation errors.     Hinda Kehr, MD 08/02/19 [phone]

## 2019-11-01 HISTORY — PX: DEBRIDEMENT  FOOT: SUR387

## 2020-01-02 ENCOUNTER — Other Ambulatory Visit: Payer: Self-pay

## 2020-01-02 ENCOUNTER — Emergency Department: Payer: Self-pay

## 2020-01-02 ENCOUNTER — Encounter: Payer: Self-pay | Admitting: Intensive Care

## 2020-01-02 ENCOUNTER — Emergency Department
Admission: EM | Admit: 2020-01-02 | Discharge: 2020-01-02 | Disposition: A | Discharge status: Home / Self Care | Payer: Self-pay | Attending: Emergency Medicine | Admitting: Emergency Medicine

## 2020-01-02 DIAGNOSIS — E1022 Type 1 diabetes mellitus with diabetic chronic kidney disease: Secondary | ICD-10-CM | POA: Insufficient documentation

## 2020-01-02 DIAGNOSIS — Y999 Unspecified external cause status: Secondary | ICD-10-CM | POA: Insufficient documentation

## 2020-01-02 DIAGNOSIS — S2241XA Multiple fractures of ribs, right side, initial encounter for closed fracture: Secondary | ICD-10-CM | POA: Insufficient documentation

## 2020-01-02 DIAGNOSIS — F17210 Nicotine dependence, cigarettes, uncomplicated: Secondary | ICD-10-CM | POA: Insufficient documentation

## 2020-01-02 DIAGNOSIS — N181 Chronic kidney disease, stage 1: Secondary | ICD-10-CM | POA: Insufficient documentation

## 2020-01-02 DIAGNOSIS — Y93E1 Activity, personal bathing and showering: Secondary | ICD-10-CM | POA: Insufficient documentation

## 2020-01-02 DIAGNOSIS — W01198A Fall on same level from slipping, tripping and stumbling with subsequent striking against other object, initial encounter: Secondary | ICD-10-CM | POA: Insufficient documentation

## 2020-01-02 DIAGNOSIS — Y92002 Bathroom of unspecified non-institutional (private) residence single-family (private) house as the place of occurrence of the external cause: Secondary | ICD-10-CM | POA: Insufficient documentation

## 2020-01-02 DIAGNOSIS — Z794 Long term (current) use of insulin: Secondary | ICD-10-CM | POA: Insufficient documentation

## 2020-01-02 DIAGNOSIS — Z79899 Other long term (current) drug therapy: Secondary | ICD-10-CM | POA: Insufficient documentation

## 2020-01-02 DIAGNOSIS — E875 Hyperkalemia: Secondary | ICD-10-CM | POA: Insufficient documentation

## 2020-01-02 LAB — CBC
HCT: 30.8 % — ABNORMAL LOW (ref 39.0–52.0)
Hemoglobin: 10.4 g/dL — ABNORMAL LOW (ref 13.0–17.0)
MCH: 30.4 pg (ref 26.0–34.0)
MCHC: 33.8 g/dL (ref 30.0–36.0)
MCV: 90.1 fL (ref 80.0–100.0)
Platelets: 283 10*3/uL (ref 150–400)
RBC: 3.42 MIL/uL — ABNORMAL LOW (ref 4.22–5.81)
RDW: 12.3 % (ref 11.5–15.5)
WBC: 8.6 10*3/uL (ref 4.0–10.5)
nRBC: 0 % (ref 0.0–0.2)

## 2020-01-02 LAB — BASIC METABOLIC PANEL
Anion gap: 10 (ref 5–15)
Anion gap: 7 (ref 5–15)
BUN: 25 mg/dL — ABNORMAL HIGH (ref 6–20)
BUN: 24 mg/dL — ABNORMAL HIGH (ref 6–20)
CO2: 24 mmol/L (ref 22–32)
CO2: 23 mmol/L (ref 22–32)
Calcium: 9 mg/dL (ref 8.9–10.3)
Calcium: 8.4 mg/dL — ABNORMAL LOW (ref 8.9–10.3)
Chloride: 102 mmol/L (ref 98–111)
Chloride: 106 mmol/L (ref 98–111)
Creatinine, Ser: 2.2 mg/dL — ABNORMAL HIGH (ref 0.61–1.24)
Creatinine, Ser: 2.03 mg/dL — ABNORMAL HIGH (ref 0.61–1.24)
GFR calc Af Amer: 46 mL/min — ABNORMAL LOW (ref >60)
GFR calc Af Amer: 51 mL/min — ABNORMAL LOW (ref >60)
GFR calc non Af Amer: 40 mL/min — ABNORMAL LOW (ref >60)
GFR calc non Af Amer: 44 mL/min — ABNORMAL LOW (ref >60)
Glucose, Bld: 260 mg/dL — ABNORMAL HIGH (ref 70–99)
Glucose, Bld: 232 mg/dL — ABNORMAL HIGH (ref 70–99)
Potassium: 5.2 mmol/L — ABNORMAL HIGH (ref 3.5–5.1)
Potassium: 5.2 mmol/L — ABNORMAL HIGH (ref 3.5–5.1)
Sodium: 136 mmol/L (ref 135–145)
Sodium: 136 mmol/L (ref 135–145)

## 2020-01-02 MED ORDER — PATIROMER SORBITEX CALCIUM 8.4 G PO PACK
16.8000 g | PACK | Freq: Once | ORAL | Status: AC
  Administered 2020-01-02: 16.8 g via ORAL
  Filled 2020-01-02: qty 2

## 2020-01-02 MED ORDER — OXYCODONE-ACETAMINOPHEN 5-325 MG PO TABS
1.0000 | ORAL_TABLET | ORAL | Status: AC | PRN
  Administered 2020-01-02 (×2): 1 via ORAL
  Filled 2020-01-02 (×2): qty 1

## 2020-01-02 MED ORDER — FENTANYL CITRATE (PF) 100 MCG/2ML IJ SOLN
50.0000 ug | Freq: Once | INTRAMUSCULAR | Status: AC
  Administered 2020-01-02: 50 ug via INTRAMUSCULAR
  Filled 2020-01-02: qty 2

## 2020-01-02 MED ORDER — SODIUM CHLORIDE 0.9 % IV BOLUS
1000.0000 mL | Freq: Once | INTRAVENOUS | Status: AC
  Administered 2020-01-02: 1000 mL via INTRAVENOUS

## 2020-01-02 MED ORDER — OXYCODONE-ACETAMINOPHEN 5-325 MG PO TABS: 1.0000 | ORAL_TABLET | ORAL | 0 refills | Status: AC | PRN

## 2020-01-02 NOTE — ED Notes (Signed)
Says he slipped in tub this am and hit right side ribs on tub.

## 2020-01-02 NOTE — Discharge Instructions (Addendum)
Follow-up with Dr. Candiss Norse about your kidneys, return the emergency department if worsening rib pain or difficulty breathing.  Take medication as prescribed.

## 2020-01-02 NOTE — ED Notes (Addendum)
Patient made aware he cannot drive after receiving pain medication. Patient verbally agreed he will not drive for four hours after receiving pain medication

## 2020-01-02 NOTE — ED Provider Notes (Signed)
Assumed care of the patient from Laban Emperor, PA-C.  Labs are pending.  She states patient does not want to stay for admission. Physical Exam  BP (!) 138/97 (BP Location: Right Arm)   Pulse 99   Temp 98.2 F (36.8 C) (Oral)   Resp 18   Ht 5\' 9"  (1.753 m)   Wt 70.3 kg   SpO2 100%   BMI 22.89 kg/m   Physical Exam patient appears to be stable  ED Course/Procedures     Procedures  MDM   Patient had presented to emergency department with multiple rib fractures.  Upon lab testing for CT we did discover the patient has decreased kidney function.  It is worse than his normal chronic kidney function.  Patient does have history of diabetes.  Patient was given fluids and potassium is still elevated.  I did discuss the findings with the patient.  He does not want to stay.  I explained to him I would encourage him with the potassium level being what it is to stay.  However I did discuss this with Dr. Joni Fears and we gave him medication to decrease potassium levels.  Sent a secure message to Dr. Candiss Norse concerning the patient.  I did explain to him that he does not want to be admitted and I would like to have him follow-up in his clinic.  Patient was instructed to call and answer the phone for [facility].  He was given a work note due to the rib fractures.  Is discharged stable condition.       Versie Starks, PA-C 01/02/20 Gerrianne Scale, MD 01/02/20 [phone]

## 2020-01-02 NOTE — ED Provider Notes (Signed)
[facility] Emergency Department Provider Note  ____________________________________________  Time seen: Approximately 1:25 PM  I have reviewed the triage vital signs and the nursing notes.   HISTORY  Chief Complaint Fall    HPI Jonathan Mcmillan is a 24 y.o. male with past medical history of diabetes and hypertension that presents to the emergency department for evaluation of right rib pain after falling this morning.  Patient was getting out of the shower when he slipped and hit his right rib cage on the shower.  He did not hit his head or lose consciousness.  No additional injuries.  No shortness of breath or abdominal pain.  Past Medical History:  Diagnosis Date  . Cannabinoid hyperemesis syndrome   . Diabetes 1.5, managed as type 1 ([place])   . Perirectal abscess 06/16/2017    Patient Active Problem List   Diagnosis Date Noted  . Abdominal pain 09/09/2018  . Right arm cellulitis 01/26/2018  . Sepsis ([place]) 03/06/2017  . Malnutrition of moderate degree 05/22/2016  . DKA (diabetic ketoacidoses) ([place]) 05/21/2016  . Type I (juvenile type) diabetes mellitus without mention of complication, uncontrolled 10/05/2010  . Hypertension 10/05/2010  . Obesity 10/05/2010    Past Surgical History:  Procedure Laterality Date  . INCISION AND DRAINAGE PERIRECTAL ABSCESS N/A 06/16/2017   Procedure: IRRIGATION AND DEBRIDEMENT PERIRECTAL ABSCESS;  Surgeon: Florene Glen, MD;  Location: [facility] ORS;  Service: General;  Laterality: N/A;  . none      Prior to Admission medications   Medication Sig Start Date End Date Taking? Authorizing Provider  insulin aspart protamine- aspart (NOVOLOG MIX 70/30) (70-30) 100 UNIT/ML injection Inject 0.2 mLs (20 Units total) into the skin 2 (two) times daily with a meal. 08/02/19  Yes Hinda Kehr, MD  insulin glargine (LANTUS) 100 UNIT/ML injection Inject 30 Units into the skin at bedtime.   Yes [provider]  amoxicillin  (AMOXIL) 500 MG capsule Take 1 capsule (500 mg total) by mouth 3 (three) times daily. Patient not taking: Reported on 06/26/2019 12/20/18   Versie Starks, PA-C  blood glucose meter kit and supplies KIT Dispense based on patient and insurance preference. Use up to four times daily as directed. (FOR ICD-9 250.00, 250.01). 08/02/19   Hinda Kehr, MD  lisinopril (ZESTRIL) 40 MG tablet Take 1 tablet (40 mg total) by mouth daily. 09/11/18   Hillary Bow, MD  naproxen (NAPROSYN) 500 MG tablet Take 1 tablet (500 mg total) by mouth 2 (two) times daily with a meal. Patient not taking: Reported on 12/10/2018 09/22/18   Lavonia Drafts, MD  naproxen (NAPROSYN) 500 MG tablet Take 1 tablet (500 mg total) by mouth 2 (two) times daily with a meal. Patient not taking: Reported on 12/10/2018 09/24/18   Lavonia Drafts, MD  ondansetron (ZOFRAN ODT) 4 MG disintegrating tablet Take 1 tablet (4 mg total) by mouth every 6 (six) hours as needed for nausea or vomiting. 07/23/19   Delman Kitten, MD  pantoprazole (PROTONIX) 40 MG tablet Take 1 tablet (40 mg total) by mouth daily. Patient not taking: Reported on 12/10/2018 09/10/18   Hillary Bow, MD  promethazine (PHENERGAN) 25 MG tablet Take 1 tablet (25 mg total) by mouth every 6 (six) hours as needed for nausea or vomiting. 06/26/19   Duffy Bruce, MD  sucralfate (CARAFATE) 1 g tablet Take 1 tablet (1 g total) by mouth 4 (four) times daily. Patient not taking: Reported on 06/26/2019 02/04/19   Nance Pear, MD  Allergies Patient has no known allergies.  Family History  Problem Relation Age of Onset  . Diabetes Mother     Social History Social History   Tobacco Use  . Smoking status: Current Every Day Smoker    Packs/day: 0.50    Types: Cigarettes  . Smokeless tobacco: Never Used  Vaping Use  . Vaping Use: Never used  Substance Use Topics  . Alcohol use: No  . Drug use: Yes    Types: Marijuana     Review of Systems  Cardiovascular: No chest  pain. Respiratory: No SOB. Gastrointestinal: No abdominal pain.  No nausea, no vomiting.  Musculoskeletal: Positive for rib pain. Skin: Negative for rash, abrasions, lacerations, ecchymosis. Neurological: Negative for headaches, numbness or tingling   ____________________________________________   PHYSICAL EXAM:  VITAL SIGNS: ED Triage Vitals  Enc Vitals Group     BP 01/02/20 1200 (!) 138/97     Pulse Rate 01/02/20 1200 99     Resp 01/02/20 1200 18     Temp 01/02/20 1200 98.2 F (36.8 C)     Temp Source 01/02/20 1200 Oral     SpO2 01/02/20 1200 100 %     Weight 01/02/20 1200 155 lb (70.3 kg)     Height 01/02/20 1200 _0  (1.753 m)     Head Circumference --      Peak Flow --      Pain Score 01/02/20 1204 10     Pain Loc --      Pain Edu? --      Excl. in [place]? --      Constitutional: Alert and oriented. Well appearing and in no acute distress. Eyes: Conjunctivae are normal. PERRL. EOMI. Head: Atraumatic. ENT:      Ears:      Nose: No congestion/rhinnorhea.      Mouth/Throat: Mucous membranes are moist.  Neck: No stridor.   Cardiovascular: Normal rate, regular rhythm.  Good peripheral circulation. Respiratory: Normal respiratory effort without tachypnea or retractions. Lungs CTAB. Good air entry to the bases with no decreased or absent breath sounds. Gastrointestinal: Bowel sounds 4 quadrants. Soft and nontender to palpation. No guarding or rigidity. No palpable masses. No distention.  Musculoskeletal: Full range of motion to all extremities. No gross deformities appreciated.  Tenderness to palpation to right anterior inferior rib cage. Neurologic:  Normal speech and language. No gross focal neurologic deficits are appreciated.  Skin:  Skin is warm, dry and intact. No rash noted. Psychiatric: Mood and affect are normal. Speech and behavior are normal. Patient exhibits appropriate insight and judgement.   ____________________________________________   LABS (all labs  ordered are listed, but only abnormal results are displayed)  Labs Reviewed  BASIC METABOLIC PANEL - Abnormal; Notable for the following components:      Result Value   Potassium 5.2 (*)    Glucose, Bld 260 (*)    BUN 25 (*)    Creatinine, Ser 2.20 (*)    GFR calc non Af Amer 40 (*)    GFR calc Af Amer 46 (*)    All other components within normal limits  CBC - Abnormal; Notable for the following components:   RBC 3.42 (*)    Hemoglobin 10.4 (*)    HCT 30.8 (*)    All other components within normal limits   ____________________________________________  EKG   ____________________________________________  RADIOLOGY Robinette Haines, personally viewed and evaluated these images (plain radiographs) as part of my medical decision making, as well as  reviewing the written report by the radiologist.  DG Ribs Unilateral W/Chest Right  Result Date: 01/02/2020 CLINICAL DATA:  Acute RIGHT chest and rib pain following fall today. Initial encounter. EXAM: RIGHT RIBS AND CHEST - 3+ VIEW COMPARISON:  06/26/2019 prior radiographs FINDINGS: There is a fracture of the anterolateral RIGHT 8th rib with 4 mm displacement. Nondisplaced fractures of the anterolateral RIGHT 7th and 9th ribs are present. The cardiomediastinal silhouette is unremarkable. There is no evidence of focal airspace disease, pulmonary edema, suspicious pulmonary nodule/mass, pleural effusion, or pneumothorax. IMPRESSION: Fractures of the anterolateral RIGHT 8th, 7th and 9th ribs. The 8th rib fracture is displaced. No pleural effusion or pneumothorax. Electronically Signed   By: Margarette Canada M.D.   On: 01/02/2020 14:05    ____________________________________________    PROCEDURES  Procedure(s) performed:    Procedures    Medications  oxyCODONE-acetaminophen (PERCOCET/ROXICET) 5-325 MG per tablet 1 tablet (1 tablet Oral Given 01/02/20 1209)  sodium chloride 0.9 % bolus 1,000 mL (has no administration in time range)   fentaNYL (SUBLIMAZE) injection 50 mcg (50 mcg Intramuscular Given 01/02/20 1454)     ____________________________________________   INITIAL IMPRESSION / ASSESSMENT AND PLAN / ED COURSE  Pertinent labs & imaging results that were available during my care of the patient were reviewed by me and considered in my medical decision making (see chart for details).  Review of the [place] CSRS was performed in accordance of the [place] prior to dispensing any controlled drugs.   Patient presented to emergency department for evaluation after fall.  Vital signs and exam are reassuring.  X-ray consistent with non displaced seventh, displaced eight, and nondisplaced ninth rib fractures.  Lab work is remarkable for potassium elevation of 5.2, glucose 260, BUN 25, creatinine 2.2. GFR is 46.  Patient has chronic kidney disease and BUN and creatinine are mildly increased from previous.  1 L of fluids were ordered.  CT chest without contrast was ordered to further evaluate the rib fractures.  Patient was given fentanyl and Percocet for rib fractures.  Patient refuses admission at this time for pain control and hyperkalemia.  Lab work will be rechecked following fluid bolus.  Care was transferred to [facility] for disposition pending CT results and labwork recheck.  OTHNIEL MARET was evaluated in Emergency Department on 01/02/2020 for the symptoms described in the history of present illness. He was evaluated in the context of the global COVID-19 pandemic, which necessitated consideration that the patient might be at risk for infection with the SARS-CoV-2 virus that causes COVID-19. Institutional protocols and algorithms that pertain to the evaluation of patients at risk for COVID-19 are in a state of rapid change based on information released by regulatory bodies including the CDC and federal and state organizations. These policies and algorithms were followed during the patient's care in the  ED. ____________________________________________  FINAL CLINICAL IMPRESSION(S) / ED DIAGNOSES     NEW MEDICATIONS STARTED DURING THIS VISIT:  ED Discharge Orders    None          This chart was dictated using voice recognition software/Dragon. Despite best efforts to proofread, errors can occur which can change the meaning. Any change was purely unintentional.    Laban Emperor, PA-C 01/03/20 0731    Lavonia Drafts, MD 01/03/20 [phone]

## 2020-01-02 NOTE — ED Notes (Addendum)
bp is up, and Jonathan Mcmillan is aware.   The patient says he normally takes lisinopril in eve and will take when he gets home.  He agrees to recheck his bp tomorrow--he says he has bp machine at home.  Incentive spironometer given and instructed on use.

## 2020-01-02 NOTE — ED Triage Notes (Signed)
Patient reports falling in tub and now experiencing right rib cage pain. Denies hitting head or LOC. Drove self to ER

## 2020-01-02 NOTE — ED Provider Notes (Signed)
Procedures     ----------------------------------------- 5:27 PM on 01/02/2020 ----------------------------------------- EKG interpreted by me Normal sinus rhythm, rate of 98.  Normal axis and intervals.  Normal QRS and ST segments.  T waves appear peaked anteriorly without inversion or other abnormality.     Carrie Mew, MD 01/02/20 1728

## 2020-03-08 ENCOUNTER — Encounter: Payer: Self-pay | Admitting: Emergency Medicine

## 2020-03-08 ENCOUNTER — Other Ambulatory Visit: Payer: Self-pay

## 2020-03-08 ENCOUNTER — Emergency Department: Payer: Self-pay

## 2020-03-08 ENCOUNTER — Emergency Department
Admission: EM | Admit: 2020-03-08 | Discharge: 2020-03-08 | Disposition: A | Discharge status: Home / Self Care | Payer: Self-pay | Attending: Emergency Medicine | Admitting: Emergency Medicine

## 2020-03-08 DIAGNOSIS — I129 Hypertensive chronic kidney disease with stage 1 through stage 4 chronic kidney disease, or unspecified chronic kidney disease: Secondary | ICD-10-CM | POA: Insufficient documentation

## 2020-03-08 DIAGNOSIS — E1022 Type 1 diabetes mellitus with diabetic chronic kidney disease: Secondary | ICD-10-CM | POA: Insufficient documentation

## 2020-03-08 DIAGNOSIS — Z794 Long term (current) use of insulin: Secondary | ICD-10-CM | POA: Insufficient documentation

## 2020-03-08 DIAGNOSIS — F17210 Nicotine dependence, cigarettes, uncomplicated: Secondary | ICD-10-CM | POA: Insufficient documentation

## 2020-03-08 DIAGNOSIS — E1010 Type 1 diabetes mellitus with ketoacidosis without coma: Secondary | ICD-10-CM | POA: Insufficient documentation

## 2020-03-08 DIAGNOSIS — Z79899 Other long term (current) drug therapy: Secondary | ICD-10-CM | POA: Insufficient documentation

## 2020-03-08 DIAGNOSIS — N189 Chronic kidney disease, unspecified: Secondary | ICD-10-CM | POA: Insufficient documentation

## 2020-03-08 DIAGNOSIS — K3184 Gastroparesis: Secondary | ICD-10-CM | POA: Insufficient documentation

## 2020-03-08 DIAGNOSIS — E1043 Type 1 diabetes mellitus with diabetic autonomic (poly)neuropathy: Secondary | ICD-10-CM | POA: Insufficient documentation

## 2020-03-08 DIAGNOSIS — R197 Diarrhea, unspecified: Secondary | ICD-10-CM | POA: Insufficient documentation

## 2020-03-08 HISTORY — DX: Gastroparesis: K31.84

## 2020-03-08 LAB — URINALYSIS, COMPLETE (UACMP) WITH MICROSCOPIC
Bilirubin Urine: NEGATIVE
Glucose, UA: NEGATIVE mg/dL
Ketones, ur: NEGATIVE mg/dL
Leukocytes,Ua: NEGATIVE
Nitrite: NEGATIVE
Protein, ur: >=300 mg/dL — AB
Specific Gravity, Urine: 1.012 (ref 1.005–1.030)
Squamous Epithelial / HPF: NONE SEEN (ref 0–5)
pH: 7 (ref 5.0–8.0)

## 2020-03-08 LAB — COMPREHENSIVE METABOLIC PANEL
ALT: 14 U/L (ref 0–44)
AST: 15 U/L (ref 15–41)
Albumin: 3.8 g/dL (ref 3.5–5.0)
Alkaline Phosphatase: 119 U/L (ref 38–126)
Anion gap: 10 (ref 5–15)
BUN: 18 mg/dL (ref 6–20)
CO2: 27 mmol/L (ref 22–32)
Calcium: 9.1 mg/dL (ref 8.9–10.3)
Chloride: 101 mmol/L (ref 98–111)
Creatinine, Ser: 2.3 mg/dL — ABNORMAL HIGH (ref 0.61–1.24)
GFR, Estimated: 39 mL/min — ABNORMAL LOW (ref >60)
Glucose, Bld: 128 mg/dL — ABNORMAL HIGH (ref 70–99)
Potassium: 3.9 mmol/L (ref 3.5–5.1)
Sodium: 138 mmol/L (ref 135–145)
Total Bilirubin: 1.3 mg/dL — ABNORMAL HIGH (ref 0.3–1.2)
Total Protein: 7 g/dL (ref 6.5–8.1)

## 2020-03-08 LAB — CBC WITH DIFFERENTIAL/PLATELET
Abs Immature Granulocytes: 0.01 10*3/uL (ref 0.00–0.07)
Basophils Absolute: 0 10*3/uL (ref 0.0–0.1)
Basophils Relative: 0 %
Eosinophils Absolute: 0.1 10*3/uL (ref 0.0–0.5)
Eosinophils Relative: 2 %
HCT: 31 % — ABNORMAL LOW (ref 39.0–52.0)
Hemoglobin: 10.2 g/dL — ABNORMAL LOW (ref 13.0–17.0)
Immature Granulocytes: 0 %
Lymphocytes Relative: 38 %
Lymphs Abs: 1.7 10*3/uL (ref 0.7–4.0)
MCH: 29.7 pg (ref 26.0–34.0)
MCHC: 32.9 g/dL (ref 30.0–36.0)
MCV: 90.4 fL (ref 80.0–100.0)
Monocytes Absolute: 0.4 10*3/uL (ref 0.1–1.0)
Monocytes Relative: 10 %
Neutro Abs: 2.3 10*3/uL (ref 1.7–7.7)
Neutrophils Relative %: 50 %
Platelets: 237 10*3/uL (ref 150–400)
RBC: 3.43 MIL/uL — ABNORMAL LOW (ref 4.22–5.81)
RDW: 12.2 % (ref 11.5–15.5)
WBC: 4.6 10*3/uL (ref 4.0–10.5)
nRBC: 0 % (ref 0.0–0.2)

## 2020-03-08 LAB — URINE DRUG SCREEN, QUALITATIVE (ARMC ONLY)
Amphetamines, Ur Screen: NOT DETECTED
Barbiturates, Ur Screen: NOT DETECTED
Benzodiazepine, Ur Scrn: NOT DETECTED
Cannabinoid 50 Ng, Ur ~~LOC~~: POSITIVE — AB
Cocaine Metabolite,Ur ~~LOC~~: NOT DETECTED
MDMA (Ecstasy)Ur Screen: NOT DETECTED
Methadone Scn, Ur: NOT DETECTED
Opiate, Ur Screen: NOT DETECTED
Phencyclidine (PCP) Ur S: NOT DETECTED
Tricyclic, Ur Screen: NOT DETECTED

## 2020-03-08 LAB — TROPONIN I (HIGH SENSITIVITY): Troponin I (High Sensitivity): 7 ng/L (ref <18)

## 2020-03-08 LAB — LIPASE, BLOOD: Lipase: 25 U/L (ref 11–51)

## 2020-03-08 MED ORDER — OMEPRAZOLE 40 MG PO CPDR: 40.0000 mg | DELAYED_RELEASE_CAPSULE | Freq: Every day | ORAL | 1 refills | Status: DC

## 2020-03-08 MED ORDER — ONDANSETRON 4 MG PO TBDP
4.0000 mg | ORAL_TABLET | Freq: Once | ORAL | Status: AC
  Administered 2020-03-08: 4 mg via ORAL
  Filled 2020-03-08: qty 1

## 2020-03-08 MED ORDER — FAMOTIDINE IN NACL 20-0.9 MG/50ML-% IV SOLN
20.0000 mg | Freq: Once | INTRAVENOUS | Status: AC
  Administered 2020-03-08: 20 mg via INTRAVENOUS
  Filled 2020-03-08: qty 50

## 2020-03-08 MED ORDER — SODIUM CHLORIDE 0.9 % IV BOLUS
1000.0000 mL | Freq: Once | INTRAVENOUS | Status: AC
  Administered 2020-03-08: 1000 mL via INTRAVENOUS

## 2020-03-08 MED ORDER — METOCLOPRAMIDE HCL 5 MG/ML IJ SOLN
10.0000 mg | Freq: Once | INTRAMUSCULAR | Status: AC
  Administered 2020-03-08: 10 mg via INTRAVENOUS
  Filled 2020-03-08: qty 2

## 2020-03-08 NOTE — ED Triage Notes (Addendum)
Patient ambulatory to triage with steady gait, without difficulty or distress noted; pt reports upper CP radiating into back and abd with no accomp symptoms x 2-3 days; st hx gastroparesis; pt smells of marijuana

## 2020-03-08 NOTE — ED Notes (Signed)
ED Provider at bedside. 

## 2020-03-08 NOTE — ED Provider Notes (Signed)
[facility] Emergency Department Provider Note ____________________________________________   First MD Initiated Contact with Patient 03/08/20 [phone]     (approximate)  I have reviewed the triage vital signs and the nursing notes.   HISTORY  Chief Complaint Chest Pain    HPI Jonathan Mcmillan is a 24 y.o. male with PMH as noted below including insulin-dependent diabetes, gastroparesis, and possible cannabinoid hyperemesis who presents with nausea and vomiting over the last 2 days, multiple episodes, associated with upper abdominal pain that radiates into his chest, as well as with some diarrhea.  The patient states that the symptoms are consistent with his episodes of gastroparesis in the past, and that he has had similar episodes every few weeks for some time.  He was most recently seen [facility] the ED at [facility] a few weeks ago.  He has been taking p.o. Reglan at home with no significant improvement.  He reports being compliant with his insulin and lisinopril for blood pressure.  Past Medical History:  Diagnosis Date  . Cannabinoid hyperemesis syndrome   . Diabetes 1.5, managed as type 1 ([place])   . Gastroparesis   . Perirectal abscess 06/16/2017    Patient Active Problem List   Diagnosis Date Noted  . Abdominal pain 09/09/2018  . Right arm cellulitis 01/26/2018  . Sepsis ([place]) 03/06/2017  . Malnutrition of moderate degree 05/22/2016  . DKA (diabetic ketoacidoses) 05/21/2016  . Type I (juvenile type) diabetes mellitus without mention of complication, uncontrolled 10/05/2010  . Hypertension 10/05/2010  . Obesity 10/05/2010    Past Surgical History:  Procedure Laterality Date  . INCISION AND DRAINAGE PERIRECTAL ABSCESS N/A 06/16/2017   Procedure: IRRIGATION AND DEBRIDEMENT PERIRECTAL ABSCESS;  Surgeon: Florene Glen, MD;  Location: [facility] ORS;  Service: General;  Laterality: N/A;  . none      Prior to Admission medications   Medication Sig Start Date  End Date Taking? Authorizing Provider  blood glucose meter kit and supplies KIT Dispense based on patient and insurance preference. Use up to four times daily as directed. (FOR ICD-9 250.00, 250.01). 08/02/19   Hinda Kehr, MD  insulin aspart protamine- aspart (NOVOLOG MIX 70/30) (70-30) 100 UNIT/ML injection Inject 0.2 mLs (20 Units total) into the skin 2 (two) times daily with a meal. 08/02/19   Hinda Kehr, MD  insulin glargine (LANTUS) 100 UNIT/ML injection Inject 30 Units into the skin at bedtime.    [provider]  lisinopril (ZESTRIL) 40 MG tablet Take 1 tablet (40 mg total) by mouth daily. 09/11/18   Hillary Bow, MD  omeprazole (PRILOSEC) 40 MG capsule Take 1 capsule (40 mg total) by mouth daily. 03/08/20 05/07/20  Arta Silence, MD  ondansetron (ZOFRAN ODT) 4 MG disintegrating tablet Take 1 tablet (4 mg total) by mouth every 6 (six) hours as needed for nausea or vomiting. 07/23/19   Delman Kitten, MD  oxyCODONE-acetaminophen (PERCOCET) 5-325 MG tablet Take 1 tablet by mouth every 4 (four) hours as needed for severe pain. 01/02/20 01/01/21  Fisher, Linden Dolin, PA-C  promethazine (PHENERGAN) 25 MG tablet Take 1 tablet (25 mg total) by mouth every 6 (six) hours as needed for nausea or vomiting. 06/26/19   Duffy Bruce, MD  pantoprazole (PROTONIX) 40 MG tablet Take 1 tablet (40 mg total) by mouth daily. Patient not taking: Reported on 12/10/2018 09/10/18 01/02/20  Hillary Bow, MD  sucralfate (CARAFATE) 1 g tablet Take 1 tablet (1 g total) by mouth 4 (four) times daily. Patient not taking:  Reported on 06/26/2019 02/04/19 01/02/20  Nance Pear, MD    Allergies Patient has no known allergies.  Family History  Problem Relation Age of Onset  . Diabetes Mother     Social History Social History   Tobacco Use  . Smoking status: Current Every Day Smoker    Packs/day: 0.50    Types: Cigarettes  . Smokeless tobacco: Never Used  Vaping Use  . Vaping Use: Never used    Substance Use Topics  . Alcohol use: No  . Drug use: Yes    Types: Marijuana    Review of Systems  Constitutional: No fever. Eyes: No redness. ENT: No sore throat. Cardiovascular: Denies chest pain. Respiratory: Denies shortness of breath. Gastrointestinal: Positive for vomiting and diarrhea. Genitourinary: Negative for dysuria.  Musculoskeletal: Negative for back pain. Skin: Negative for rash. Neurological: Negative for headache.   ____________________________________________   PHYSICAL EXAM:  VITAL SIGNS: ED Triage Vitals  Enc Vitals Group     BP 03/08/20 0612 126/82     Pulse Rate 03/08/20 0612 (!) 102     Resp 03/08/20 0612 20     Temp 03/08/20 0612 98.8 F (37.1 C)     Temp Source 03/08/20 0612 Oral     SpO2 03/08/20 0612 100 %     Weight 03/08/20 0609 150 lb (68 kg)     Height 03/08/20 0609 6' (1.829 m)     Head Circumference --      Peak Flow --      Pain Score 03/08/20 0609 8     Pain Loc --      Pain Edu? --      Excl. in [place]? --     Constitutional: Alert and oriented.  Relatively well appearing, in no acute distress. Eyes: Conjunctivae are normal.  No scleral icterus. Head: Atraumatic. Nose: No congestion/rhinnorhea. Mouth/Throat: Mucous membranes are moist.   Neck: Normal range of motion.  Cardiovascular: Normal rate, regular rhythm. Good peripheral circulation. Respiratory: Normal respiratory effort.  No retractions.  Gastrointestinal: Soft with mild diffuse discomfort to palpation, but no focal tenderness or peritoneal signs.  No distention.  Genitourinary: No flank tenderness. Musculoskeletal: No lower extremity edema.  Extremities warm and well perfused.  Neurologic:  Normal speech and language. No gross focal neurologic deficits are appreciated.  Skin:  Skin is warm and dry. No rash noted. Psychiatric: Mood and affect are normal. Speech and behavior are normal.  ____________________________________________   LABS (all labs ordered are  listed, but only abnormal results are displayed)  Labs Reviewed  CBC WITH DIFFERENTIAL/PLATELET - Abnormal; Notable for the following components:      Result Value   RBC 3.43 (*)    Hemoglobin 10.2 (*)    HCT 31.0 (*)    All other components within normal limits  COMPREHENSIVE METABOLIC PANEL - Abnormal; Notable for the following components:   Glucose, Bld 128 (*)    Creatinine, Ser 2.30 (*)    Total Bilirubin 1.3 (*)    GFR, Estimated 39 (*)    All other components within normal limits  URINALYSIS, COMPLETE (UACMP) WITH MICROSCOPIC - Abnormal; Notable for the following components:   Color, Urine YELLOW (*)    APPearance CLEAR (*)    Hgb urine dipstick SMALL (*)    Protein, ur >=300 (*)    Bacteria, UA RARE (*)    All other components within normal limits  URINE DRUG SCREEN, QUALITATIVE ([facility] ONLY) - Abnormal; Notable for the following components:  Cannabinoid 50 Ng, Ur [place] POSITIVE (*)    All other components within normal limits  LIPASE, BLOOD  TROPONIN I (HIGH SENSITIVITY)   ____________________________________________  EKG  ED ECG REPORT I, Arta Silence, the attending physician, personally viewed and interpreted this ECG.  Date: 03/08/2020 EKG Time: 0612 Rate: 100 Rhythm: normal sinus rhythm QRS Axis: normal Intervals: normal ST/T Wave abnormalities: normal Narrative Interpretation: no evidence of acute ischemia  ____________________________________________  RADIOLOGY    ____________________________________________   PROCEDURES  Procedure(s) performed: No  Procedures  Critical Care performed: No ____________________________________________   INITIAL IMPRESSION / ASSESSMENT AND PLAN / ED COURSE  Pertinent labs & imaging results that were available during my care of the patient were reviewed by me and considered in my medical decision making (see chart for details).  24 year old male with PMH as noted above including diabetes, gastroparesis,  and chronic kidney disease presents with an episode of nausea and vomiting, diarrhea, and upper abdominal pain, which he states is consistent with prior episodes of what he refers to his gastroparesis.  The patient also frequently uses marijuana and has been diagnosed with possible cannabinoid hyperemesis as well.  He reports being compliant with his medications.  I reviewed the past medical records in [place].  The patient was most recently seen at the [facility] ED earlier this month with a similar presentation, treated with Reglan with improvement.  He was seen here for similar presentation in March.  On exam, the patient is overall well-appearing.  His vitals are normal except for hypertension.  The abdomen is soft with mild discomfort but no focal tenderness.  Physical exam is otherwise unremarkable.  Differential includes gastroparesis, gastritis, gastroenteritis, cannabinoid hyperemesis, pancreatitis, or less likely other hepatobiliary cause.  There is no evidence of cardiac etiology.  We will give fluids, IV Reglan, obtain lab work-up, and reassess.  ----------------------------------------- 9:26 AM on 03/08/2020 -----------------------------------------  The patient is feeling much better after Reglan, Pepcid, and fluids.  Lab work-up is significant for a creatinine of 2.3.  This appears to have gradually worsened over the last few months, but is not an acute change.  Most recent creatinine from 2 weeks ago at [facility] was 2.17.  There is also some protein on the urinalysis which has been seen previously.  The patient's blood pressure is fairly elevated although he is asymptomatic with no signs or symptoms of end organ dysfunction.  He has not yet taken his blood pressure medication today.  At this time there is no evidence of hypertensive emergency or indication for immediate treatment.  At this time, the patient is stable for discharge home with outpatient follow-up.   He states that he already has nephrology follow-up scheduled for next month.  In addition to the Reglan he is already taking I will start him on Prilosec.  I counseled him on the results of the work-up, the importance of following up with nephrology and with his PMD, and medication compliance.  Return precautions given, and he expresses understanding.  ____________________________________________   FINAL CLINICAL IMPRESSION(S) / ED DIAGNOSES  Final diagnoses:  Gastroparesis      NEW MEDICATIONS STARTED DURING THIS VISIT:  New Prescriptions   OMEPRAZOLE (PRILOSEC) 40 MG CAPSULE    Take 1 capsule (40 mg total) by mouth daily.     Note:  This document was prepared using Dragon voice recognition software and may include unintentional dictation errors.   Arta Silence, MD 03/08/20 [phone]

## 2020-03-08 NOTE — ED Notes (Signed)
Able to drink without emesis.

## 2020-03-08 NOTE — ED Notes (Signed)
Pt alert and oriented X 4, stable for discharge. RR even and unlabored, color WNL. Discussed discharge instructions and follow-up as directed. Discharge medications discussed if provided. Pt had opportunity to ask questions if necessary and RN to provide patient/family eduction. EDP aware of BP and pt educated to continue to take his BP medication, as he had missed the last 3-4 days of it.

## 2020-03-08 NOTE — Discharge Instructions (Addendum)
Start taking the Prilosec as prescribed daily.  You can continue taking the Reglan (metoclopramide) for nausea when needed.  Make sure you are taking your blood pressure medication and insulin consistently.  Follow-up with the kidney doctor as well as with your primary care doctor as scheduled.  Return to the ER for new, worsening, or persistent severe vomiting, abdominal pain, weakness, or any other new or worsening symptoms that concern you.

## 2020-05-22 ENCOUNTER — Emergency Department
Admission: EM | Admit: 2020-05-22 | Discharge: 2020-05-22 | Disposition: A | Discharge status: Home / Self Care | Payer: HRSA Program | Attending: Emergency Medicine | Admitting: Emergency Medicine

## 2020-05-22 ENCOUNTER — Other Ambulatory Visit: Payer: Self-pay

## 2020-05-22 ENCOUNTER — Encounter: Payer: Self-pay | Admitting: Emergency Medicine

## 2020-05-22 DIAGNOSIS — J069 Acute upper respiratory infection, unspecified: Secondary | ICD-10-CM | POA: Diagnosis not present

## 2020-05-22 DIAGNOSIS — F17210 Nicotine dependence, cigarettes, uncomplicated: Secondary | ICD-10-CM | POA: Insufficient documentation

## 2020-05-22 DIAGNOSIS — Z794 Long term (current) use of insulin: Secondary | ICD-10-CM | POA: Diagnosis not present

## 2020-05-22 DIAGNOSIS — U071 COVID-19: Secondary | ICD-10-CM

## 2020-05-22 DIAGNOSIS — I10 Essential (primary) hypertension: Secondary | ICD-10-CM | POA: Insufficient documentation

## 2020-05-22 DIAGNOSIS — Z79899 Other long term (current) drug therapy: Secondary | ICD-10-CM | POA: Diagnosis not present

## 2020-05-22 DIAGNOSIS — E1010 Type 1 diabetes mellitus with ketoacidosis without coma: Secondary | ICD-10-CM | POA: Insufficient documentation

## 2020-05-22 DIAGNOSIS — R519 Headache, unspecified: Secondary | ICD-10-CM | POA: Diagnosis present

## 2020-05-22 DIAGNOSIS — E1043 Type 1 diabetes mellitus with diabetic autonomic (poly)neuropathy: Secondary | ICD-10-CM | POA: Diagnosis not present

## 2020-05-22 HISTORY — DX: COVID-19: U07.1

## 2020-05-22 MED ORDER — ONDANSETRON 4 MG PO TBDP: 4.0000 mg | ORAL_TABLET | Freq: Three times a day (TID) | ORAL | 0 refills | Status: AC | PRN

## 2020-05-22 NOTE — ED Triage Notes (Signed)
Pt to ED via POV with c/o "I feel like I got symptoms of a cold or the flu". Pt c/o generalized body aches, cough, HA.

## 2020-05-22 NOTE — ED Provider Notes (Signed)
[facility]-EMERGENCY DEPARTMENT  ____________________________________________  Time seen: Approximately 10:51 PM  I have reviewed the triage vital signs and the nursing notes.   HISTORY  Chief Complaint Generalized Body Aches, Cough, and Headache   Historian Patient     HPI Jonathan Mcmillan is a 25 y.o. male presents to the emergency department with viral URI-like symptoms.  Patient states that he has close contacts with someone who tested positive for COVID-19.  Patient reports headache, body aches and cough.  No chest pain, chest tightness or abdominal pain.   Past Medical History:  Diagnosis Date  . Cannabinoid hyperemesis syndrome   . Diabetes 1.5, managed as type 1 ([place])   . Gastroparesis   . Perirectal abscess 06/16/2017     Immunizations up to date:  Yes.     Past Medical History:  Diagnosis Date  . Cannabinoid hyperemesis syndrome   . Diabetes 1.5, managed as type 1 ([place])   . Gastroparesis   . Perirectal abscess 06/16/2017    Patient Active Problem List   Diagnosis Date Noted  . Abdominal pain 09/09/2018  . Right arm cellulitis 01/26/2018  . Sepsis ([place]) 03/06/2017  . Malnutrition of moderate degree 05/22/2016  . DKA (diabetic ketoacidoses) 05/21/2016  . Type I (juvenile type) diabetes mellitus without mention of complication, uncontrolled 10/05/2010  . Hypertension 10/05/2010  . Obesity 10/05/2010    Past Surgical History:  Procedure Laterality Date  . INCISION AND DRAINAGE PERIRECTAL ABSCESS N/A 06/16/2017   Procedure: IRRIGATION AND DEBRIDEMENT PERIRECTAL ABSCESS;  Surgeon: Florene Glen, MD;  Location: [facility] ORS;  Service: General;  Laterality: N/A;  . none      Prior to Admission medications   Medication Sig Start Date End Date Taking? Authorizing Provider  ondansetron (ZOFRAN ODT) 4 MG disintegrating tablet Take 1 tablet (4 mg total) by mouth every 8 (eight) hours as needed for up to 5 days. 05/22/20 05/27/20 Yes Vallarie Mare M, PA-C  blood  glucose meter kit and supplies KIT Dispense based on patient and insurance preference. Use up to four times daily as directed. (FOR ICD-9 250.00, 250.01). 08/02/19   Hinda Kehr, MD  insulin aspart protamine- aspart (NOVOLOG MIX 70/30) (70-30) 100 UNIT/ML injection Inject 0.2 mLs (20 Units total) into the skin 2 (two) times daily with a meal. 08/02/19   Hinda Kehr, MD  insulin glargine (LANTUS) 100 UNIT/ML injection Inject 30 Units into the skin at bedtime.    [provider]  lisinopril (ZESTRIL) 40 MG tablet Take 1 tablet (40 mg total) by mouth daily. 09/11/18   Hillary Bow, MD  omeprazole (PRILOSEC) 40 MG capsule Take 1 capsule (40 mg total) by mouth daily. 03/08/20 05/07/20  Arta Silence, MD  oxyCODONE-acetaminophen (PERCOCET) 5-325 MG tablet Take 1 tablet by mouth every 4 (four) hours as needed for severe pain. 01/02/20 01/01/21  Fisher, Linden Dolin, PA-C  promethazine (PHENERGAN) 25 MG tablet Take 1 tablet (25 mg total) by mouth every 6 (six) hours as needed for nausea or vomiting. 06/26/19   Duffy Bruce, MD  pantoprazole (PROTONIX) 40 MG tablet Take 1 tablet (40 mg total) by mouth daily. Patient not taking: Reported on 12/10/2018 09/10/18 01/02/20  Hillary Bow, MD  sucralfate (CARAFATE) 1 g tablet Take 1 tablet (1 g total) by mouth 4 (four) times daily. Patient not taking: Reported on 06/26/2019 02/04/19 01/02/20  Nance Pear, MD    Allergies Patient has no known allergies.  Family History  Problem Relation Age of Onset  . Diabetes Mother  Social History Social History   Tobacco Use  . Smoking status: Current Every Day Smoker    Packs/day: 0.50    Types: Cigarettes  . Smokeless tobacco: Never Used  Vaping Use  . Vaping Use: Never used  Substance Use Topics  . Alcohol use: No  . Drug use: Yes    Types: Marijuana      Review of Systems  Constitutional: Patient has fever.  Eyes: No visual changes. No discharge ENT: Patient has congestion.   Cardiovascular: no chest pain. Respiratory: Patient has cough.  Gastrointestinal: No abdominal pain.  No nausea, no vomiting. Patient had diarrhea.  Genitourinary: Negative for dysuria. No hematuria Musculoskeletal: Patient has myalgias.  Skin: Negative for rash, abrasions, lacerations, ecchymosis. Neurological: Patient has headache, no focal weakness or numbness.    ____________________________________________   PHYSICAL EXAM:  VITAL SIGNS: ED Triage Vitals  Enc Vitals Group     BP 05/22/20 1814 105/84     Pulse Rate 05/22/20 1814 (!) 101     Resp 05/22/20 1814 19     Temp 05/22/20 1814 98.5 F (36.9 C)     Temp Source 05/22/20 1814 Oral     SpO2 05/22/20 1814 100 %     Weight 05/22/20 1812 160 lb (72.6 kg)     Height 05/22/20 1812 5' 9"  (1.753 m)     Head Circumference --      Peak Flow --      Pain Score 05/22/20 1812 7     Pain Loc --      Pain Edu? --      Excl. in [place]? --      Constitutional: Alert and oriented. Patient is lying supine. Eyes: Conjunctivae are normal. PERRL. EOMI. Head: Atraumatic. ENT:      Ears: Tympanic membranes are mildly injected with mild effusion bilaterally.       Nose: No congestion/rhinnorhea.      Mouth/Throat: Mucous membranes are moist. Posterior pharynx is mildly erythematous.  Hematological/Lymphatic/Immunilogical: No cervical lymphadenopathy.  Cardiovascular: Normal rate, regular rhythm. Normal S1 and S2.  Good peripheral circulation. Respiratory: Normal respiratory effort without tachypnea or retractions. Lungs CTAB. Good air entry to the bases with no decreased or absent breath sounds. Gastrointestinal: Bowel sounds 4 quadrants. Soft and nontender to palpation. No guarding or rigidity. No palpable masses. No distention. No CVA tenderness. Musculoskeletal: Full range of motion to all extremities. No gross deformities appreciated. Neurologic:  Normal speech and language. No gross focal neurologic deficits are appreciated.   Skin:  Skin is warm, dry and intact. No rash noted. Psychiatric: Mood and affect are normal. Speech and behavior are normal. Patient exhibits appropriate insight and judgement.   ____________________________________________   LABS (all labs ordered are listed, but only abnormal results are displayed)  Labs Reviewed  SARS CORONAVIRUS 2 (TAT 6-24 HRS)   ____________________________________________  EKG   ____________________________________________  RADIOLOGY  No results found.  ____________________________________________    PROCEDURES  Procedure(s) performed:     Procedures     Medications - No data to display   ____________________________________________   INITIAL IMPRESSION / ASSESSMENT AND PLAN / ED COURSE  Pertinent labs & imaging results that were available during my care of the patient were reviewed by me and considered in my medical decision making (see chart for details).      Assessment and plan Viral URI 25 year old male presents to the emergency department with viral URI-like symptoms.  Vital signs are reassuring at triage.  On physical exam,  patient was alert, active and nontoxic-appearing.  Patient feels comfortable awaiting COVID-19 results at home.  Rest and hydration were encouraged at home.  Tylenol and ibuprofen alternating were recommended for body aches and fever.  Return precautions were given to return with new or worsening symptoms.   ____________________________________________  FINAL CLINICAL IMPRESSION(S) / ED DIAGNOSES  Final diagnoses:  Viral upper respiratory tract infection      NEW MEDICATIONS STARTED DURING THIS VISIT:  ED Discharge Orders         Ordered    ondansetron (ZOFRAN ODT) 4 MG disintegrating tablet  Every 8 hours PRN        05/22/20 1959              This chart was dictated using voice recognition software/Dragon. Despite best efforts to proofread, errors can occur which can change the  meaning. Any change was purely unintentional.     Lannie Fields, PA-C 05/22/20 2254    Carrie Mew, MD 05/23/20 [phone]

## 2020-05-23 LAB — SARS CORONAVIRUS 2 (TAT 6-24 HRS): SARS Coronavirus 2: POSITIVE — AB

## 2020-05-24 ENCOUNTER — Telehealth: Payer: Self-pay | Admitting: *Deleted

## 2020-05-24 ENCOUNTER — Other Ambulatory Visit: Payer: Self-pay | Admitting: Adult Health

## 2020-05-24 NOTE — Telephone Encounter (Signed)
I connected by phone with Jonathan Mcmillan on 05/24/2020    I have spoken and communicated the following to the patient or parent/caregiver regarding COVID I connected by phone with Jonathan Mcmillan on 05/24/2020 at 4:27 PM to discuss the potential use of a new treatment for mild to moderate COVID-19 viral infection in non-hospitalized patients.  This patient is a 25 y.o. male that meets the FDA criteria for Emergency Use Authorization of COVID monoclonal antibody sotrovimab .Has a (+) direct SARS-CoV-2 viral test result Has mild or moderate COVID-19 Is NOT hospitalized due to COVID-19Is within 10 days of symptom onset Has at least one of the high risk factor(s) for progression to severe COVID-19 and/or hospitalization as defined in EUA. Specific high risk criteria :  HTN  1. Guidelines  2. The patient or parent/caregiver has the option to accept or refuse COVID monoclonal antibody treatment.  After reviewing this information with the patient,  The patient has agreed to one of the treatments. Tarry Kos, RN 05/24/2020 4:27 PM

## 2020-05-24 NOTE — Progress Notes (Signed)
I connected by phone with Jonathan Mcmillan on 05/24/2020 at 6:13 PM to discuss the potential use of a new treatment for mild to moderate COVID-19 viral infection in non-hospitalized patients.  This patient is a 25 y.o. male that meets the FDA criteria for Emergency Use Authorization of COVID monoclonal antibody sotrovimab.  Has a (+) direct SARS-CoV-2 viral test result  Has mild or moderate COVID-19   Is NOT hospitalized due to COVID-19  Is within 10 days of symptom onset  Has at least one of the high risk factor(s) for progression to severe COVID-19 and/or hospitalization as defined in EUA.  Specific high risk criteria : Diabetes   I have spoken and communicated the following to the patient or parent/caregiver regarding COVID monoclonal antibody treatment:  1. FDA has authorized the emergency use for the treatment of mild to moderate COVID-19 in adults and pediatric patients with positive results of direct SARS-CoV-2 viral testing who are 46 years of age and older weighing at least 40 kg, and who are at high risk for progressing to severe COVID-19 and/or hospitalization.  2. The significant known and potential risks and benefits of COVID monoclonal antibody, and the extent to which such potential risks and benefits are unknown.  3. Information on available alternative treatments and the risks and benefits of those alternatives, including clinical trials.  4. Patients treated with COVID monoclonal antibody should continue to self-isolate and use infection control measures (e.g., wear mask, isolate, social distance, avoid sharing personal items, clean and disinfect "high touch" surfaces, and frequent handwashing) according to CDC guidelines.   5. The patient or parent/caregiver has the option to accept or refuse COVID monoclonal antibody treatment.  After reviewing this information with the patient, the patient has agreed to receive one of the available covid 19 monoclonal antibodies and  will be provided an appropriate fact sheet prior to infusion.  Sx onset 05/20/20 , Hx of T1DM , CKD, unvaccinated. Set up for MAB 05/25/20 at 1330.    Rexene Edison, NP 05/24/2020 6:13 PM

## 2020-05-25 ENCOUNTER — Ambulatory Visit (HOSPITAL_COMMUNITY)
Admission: RE | Admit: 2020-05-25 | Discharge: 2020-05-25 | Disposition: A | Discharge status: Home / Self Care | Payer: HRSA Program | Source: Ambulatory Visit | Attending: Pulmonary Disease | Admitting: Pulmonary Disease

## 2020-05-25 DIAGNOSIS — U071 COVID-19: Secondary | ICD-10-CM | POA: Diagnosis not present

## 2020-05-25 MED ORDER — EPINEPHRINE 0.3 MG/0.3ML IJ SOAJ: 0.3000 mg | Freq: Once | INTRAMUSCULAR | Status: DC | PRN

## 2020-05-25 MED ORDER — SODIUM CHLORIDE 0.9 % IV SOLN: INTRAVENOUS | Status: DC | PRN

## 2020-05-25 MED ORDER — DIPHENHYDRAMINE HCL 50 MG/ML IJ SOLN: 50.0000 mg | Freq: Once | INTRAMUSCULAR | Status: DC | PRN

## 2020-05-25 MED ORDER — FAMOTIDINE IN NACL 20-0.9 MG/50ML-% IV SOLN: 20.0000 mg | Freq: Once | INTRAVENOUS | Status: DC | PRN

## 2020-05-25 MED ORDER — SOTROVIMAB 500 MG/8ML IV SOLN
500.0000 mg | Freq: Once | INTRAVENOUS | Status: AC
  Administered 2020-05-25: 500 mg via INTRAVENOUS

## 2020-05-25 MED ORDER — METHYLPREDNISOLONE SODIUM SUCC 125 MG IJ SOLR: 125.0000 mg | Freq: Once | INTRAMUSCULAR | Status: DC | PRN

## 2020-05-25 MED ORDER — ALBUTEROL SULFATE HFA 108 (90 BASE) MCG/ACT IN AERS: 2.0000 | INHALATION_SPRAY | Freq: Once | RESPIRATORY_TRACT | Status: DC | PRN

## 2020-05-25 NOTE — Progress Notes (Signed)
Patient reviewed Fact Sheet for Patients, Parents, and Caregivers for Emergency Use Authorization (EUA) of sotrovimab for the Treatment of Coronavirus. Patient also reviewed and is agreeable to the estimated cost of treatment. Patient is agreeable to proceed.   

## 2020-05-25 NOTE — Discharge Instructions (Signed)

## 2020-05-25 NOTE — Progress Notes (Signed)
Diagnosis: COVID-19  Physician: Dr. Patrick Wright  Procedure: [facility] Med: Sotrovimab infusion - Provided patient with sotrovimab fact sheet for patients, parents, and caregivers prior to infusion.   Complications: No immediate complications noted  Discharge: Discharged home    

## 2020-06-10 ENCOUNTER — Encounter: Payer: Self-pay | Admitting: Emergency Medicine

## 2020-06-10 ENCOUNTER — Inpatient Hospital Stay
Admission: EM | Admit: 2020-06-10 | Discharge: 2020-06-13 | DRG: 683 | Disposition: A | Discharge status: Home / Self Care | Payer: Self-pay | Attending: Internal Medicine | Admitting: Internal Medicine

## 2020-06-10 ENCOUNTER — Other Ambulatory Visit: Payer: Self-pay

## 2020-06-10 ENCOUNTER — Emergency Department: Payer: Self-pay

## 2020-06-10 DIAGNOSIS — E1322 Other specified diabetes mellitus with diabetic chronic kidney disease: Secondary | ICD-10-CM

## 2020-06-10 DIAGNOSIS — E1043 Type 1 diabetes mellitus with diabetic autonomic (poly)neuropathy: Secondary | ICD-10-CM | POA: Diagnosis present

## 2020-06-10 DIAGNOSIS — N170 Acute kidney failure with tubular necrosis: Principal | ICD-10-CM | POA: Diagnosis present

## 2020-06-10 DIAGNOSIS — I129 Hypertensive chronic kidney disease with stage 1 through stage 4 chronic kidney disease, or unspecified chronic kidney disease: Secondary | ICD-10-CM | POA: Diagnosis present

## 2020-06-10 DIAGNOSIS — Z79899 Other long term (current) drug therapy: Secondary | ICD-10-CM

## 2020-06-10 DIAGNOSIS — Z841 Family history of disorders of kidney and ureter: Secondary | ICD-10-CM

## 2020-06-10 DIAGNOSIS — K219 Gastro-esophageal reflux disease without esophagitis: Secondary | ICD-10-CM

## 2020-06-10 DIAGNOSIS — F17210 Nicotine dependence, cigarettes, uncomplicated: Secondary | ICD-10-CM | POA: Diagnosis present

## 2020-06-10 DIAGNOSIS — E1022 Type 1 diabetes mellitus with diabetic chronic kidney disease: Secondary | ICD-10-CM | POA: Diagnosis present

## 2020-06-10 DIAGNOSIS — N189 Chronic kidney disease, unspecified: Secondary | ICD-10-CM

## 2020-06-10 DIAGNOSIS — N179 Acute kidney failure, unspecified: Secondary | ICD-10-CM

## 2020-06-10 DIAGNOSIS — E871 Hypo-osmolality and hyponatremia: Secondary | ICD-10-CM | POA: Diagnosis present

## 2020-06-10 DIAGNOSIS — Z794 Long term (current) use of insulin: Secondary | ICD-10-CM

## 2020-06-10 DIAGNOSIS — I10 Essential (primary) hypertension: Secondary | ICD-10-CM

## 2020-06-10 DIAGNOSIS — D631 Anemia in chronic kidney disease: Secondary | ICD-10-CM | POA: Diagnosis present

## 2020-06-10 DIAGNOSIS — E10649 Type 1 diabetes mellitus with hypoglycemia without coma: Secondary | ICD-10-CM | POA: Diagnosis present

## 2020-06-10 DIAGNOSIS — N183 Chronic kidney disease, stage 3 unspecified: Secondary | ICD-10-CM

## 2020-06-10 DIAGNOSIS — K3184 Gastroparesis: Secondary | ICD-10-CM | POA: Diagnosis present

## 2020-06-10 DIAGNOSIS — E108 Type 1 diabetes mellitus with unspecified complications: Secondary | ICD-10-CM

## 2020-06-10 DIAGNOSIS — N1832 Chronic kidney disease, stage 3b: Secondary | ICD-10-CM | POA: Diagnosis present

## 2020-06-10 DIAGNOSIS — E860 Dehydration: Secondary | ICD-10-CM | POA: Diagnosis present

## 2020-06-10 DIAGNOSIS — Z8616 Personal history of COVID-19: Secondary | ICD-10-CM

## 2020-06-10 DIAGNOSIS — Z833 Family history of diabetes mellitus: Secondary | ICD-10-CM

## 2020-06-10 DIAGNOSIS — F1210 Cannabis abuse, uncomplicated: Secondary | ICD-10-CM | POA: Diagnosis present

## 2020-06-10 LAB — URINALYSIS, COMPLETE (UACMP) WITH MICROSCOPIC
Bacteria, UA: NONE SEEN
Bilirubin Urine: NEGATIVE
Glucose, UA: 50 mg/dL — AB
Hgb urine dipstick: NEGATIVE
Ketones, ur: NEGATIVE mg/dL
Leukocytes,Ua: NEGATIVE
Nitrite: NEGATIVE
Protein, ur: >=300 mg/dL — AB
Specific Gravity, Urine: 1.012 (ref 1.005–1.030)
pH: 7 (ref 5.0–8.0)

## 2020-06-10 LAB — COMPREHENSIVE METABOLIC PANEL
ALT: 17 U/L (ref 0–44)
AST: 13 U/L — ABNORMAL LOW (ref 15–41)
Albumin: 3.7 g/dL (ref 3.5–5.0)
Alkaline Phosphatase: 94 U/L (ref 38–126)
Anion gap: 15 (ref 5–15)
BUN: 43 mg/dL — ABNORMAL HIGH (ref 6–20)
CO2: 28 mmol/L (ref 22–32)
Calcium: 8.9 mg/dL (ref 8.9–10.3)
Chloride: 97 mmol/L — ABNORMAL LOW (ref 98–111)
Creatinine, Ser: 5.34 mg/dL — ABNORMAL HIGH (ref 0.61–1.24)
GFR, Estimated: 14 mL/min — ABNORMAL LOW (ref >60)
Glucose, Bld: 174 mg/dL — ABNORMAL HIGH (ref 70–99)
Potassium: 5 mmol/L (ref 3.5–5.1)
Sodium: 140 mmol/L (ref 135–145)
Total Bilirubin: 1.2 mg/dL (ref 0.3–1.2)
Total Protein: 7.1 g/dL (ref 6.5–8.1)

## 2020-06-10 LAB — CBG MONITORING, ED: Glucose-Capillary: 194 mg/dL — ABNORMAL HIGH (ref 70–99)

## 2020-06-10 LAB — CBC
HCT: 29 % — ABNORMAL LOW (ref 39.0–52.0)
Hemoglobin: 9.5 g/dL — ABNORMAL LOW (ref 13.0–17.0)
MCH: 30.3 pg (ref 26.0–34.0)
MCHC: 32.8 g/dL (ref 30.0–36.0)
MCV: 92.4 fL (ref 80.0–100.0)
Platelets: 231 10*3/uL (ref 150–400)
RBC: 3.14 MIL/uL — ABNORMAL LOW (ref 4.22–5.81)
RDW: 12.4 % (ref 11.5–15.5)
WBC: 5.2 10*3/uL (ref 4.0–10.5)
nRBC: 0 % (ref 0.0–0.2)

## 2020-06-10 MED ORDER — INSULIN ASPART PROT & ASPART (70-30 MIX) 100 UNIT/ML ~~LOC~~ SUSP
20.0000 [IU] | Freq: Two times a day (BID) | SUBCUTANEOUS | Status: DC
  Administered 2020-06-11: 20 [IU] via SUBCUTANEOUS
  Filled 2020-06-10: qty 10

## 2020-06-10 MED ORDER — SODIUM CHLORIDE 0.9 % IV BOLUS
1000.0000 mL | Freq: Once | INTRAVENOUS | Status: AC
  Administered 2020-06-10: 1000 mL via INTRAVENOUS

## 2020-06-10 MED ORDER — HEPARIN SODIUM (PORCINE) 5000 UNIT/ML IJ SOLN
5000.0000 [IU] | Freq: Three times a day (TID) | INTRAMUSCULAR | Status: DC
  Administered 2020-06-10 – 2020-06-13 (×6): 5000 [IU] via SUBCUTANEOUS
  Filled 2020-06-10 (×5): qty 1

## 2020-06-10 MED ORDER — SODIUM CHLORIDE 0.9 % IV SOLN: INTRAVENOUS | Status: DC

## 2020-06-10 MED ORDER — TRAZODONE HCL 50 MG PO TABS
25.0000 mg | ORAL_TABLET | Freq: Every evening | ORAL | Status: DC | PRN
  Administered 2020-06-12 (×2): 25 mg via ORAL
  Filled 2020-06-10 (×2): qty 1

## 2020-06-10 MED ORDER — INSULIN ASPART 100 UNIT/ML ~~LOC~~ SOLN
0.0000 [IU] | Freq: Three times a day (TID) | SUBCUTANEOUS | Status: DC
  Administered 2020-06-10 – 2020-06-11 (×2): 3 [IU] via SUBCUTANEOUS
  Filled 2020-06-10 (×2): qty 1

## 2020-06-10 MED ORDER — PANTOPRAZOLE SODIUM 40 MG PO TBEC
40.0000 mg | DELAYED_RELEASE_TABLET | Freq: Every day | ORAL | Status: DC
  Administered 2020-06-10 – 2020-06-13 (×4): 40 mg via ORAL
  Filled 2020-06-10 (×5): qty 1

## 2020-06-10 MED ORDER — ONDANSETRON HCL 4 MG PO TABS
4.0000 mg | ORAL_TABLET | Freq: Four times a day (QID) | ORAL | Status: DC | PRN
  Administered 2020-06-12: 4 mg via ORAL
  Filled 2020-06-10: qty 1

## 2020-06-10 MED ORDER — ACETAMINOPHEN 650 MG RE SUPP: 650.0000 mg | Freq: Four times a day (QID) | RECTAL | Status: DC | PRN

## 2020-06-10 MED ORDER — ACETAMINOPHEN 325 MG PO TABS
650.0000 mg | ORAL_TABLET | Freq: Four times a day (QID) | ORAL | Status: DC | PRN
  Administered 2020-06-11 (×2): 650 mg via ORAL
  Filled 2020-06-10 (×2): qty 2

## 2020-06-10 MED ORDER — INSULIN GLARGINE 100 UNIT/ML ~~LOC~~ SOLN
30.0000 [IU] | Freq: Every day | SUBCUTANEOUS | Status: DC
  Administered 2020-06-10: 30 [IU] via SUBCUTANEOUS
  Filled 2020-06-10 (×2): qty 0.3

## 2020-06-10 MED ORDER — MAGNESIUM HYDROXIDE 400 MG/5ML PO SUSP
30.0000 mL | Freq: Every day | ORAL | Status: DC | PRN
  Administered 2020-06-11: 30 mL via ORAL
  Filled 2020-06-10: qty 30

## 2020-06-10 MED ORDER — ONDANSETRON HCL 4 MG/2ML IJ SOLN
4.0000 mg | Freq: Four times a day (QID) | INTRAMUSCULAR | Status: DC | PRN
  Administered 2020-06-10 – 2020-06-12 (×3): 4 mg via INTRAVENOUS
  Filled 2020-06-10 (×3): qty 2

## 2020-06-10 NOTE — ED Provider Notes (Signed)
[facility] Emergency Department Provider Note  ____________________________________________   I have reviewed the triage vital signs and the nursing notes.   HISTORY  Chief Complaint abnormal lab work   History limited by: Not Limited   HPI Jonathan Mcmillan is a 25 y.o. male who presents to the emergency department today because of concerns for abnormal kidney function found on blood test ran yesterday.  Patient states he went to his primary care as part of a routine exam.  He does state that he has not been feeling particularly well recently.  He was diagnosed with Covid of couple of weeks ago.  He states that the congestion has improved but he has felt weak every morning.  Patient states that he does have some family history of kidney dysfunction.  Patient has history of diabetes.   Records reviewed. Per medical record review patient has a history of gastroparesis, dm.   Past Medical History:  Diagnosis Date  . Cannabinoid hyperemesis syndrome   . Diabetes 1.5, managed as type 1 ([place])   . Gastroparesis   . Perirectal abscess 06/16/2017    Patient Active Problem List   Diagnosis Date Noted  . Abdominal pain 09/09/2018  . Right arm cellulitis 01/26/2018  . Sepsis ([place]) 03/06/2017  . Malnutrition of moderate degree 05/22/2016  . DKA (diabetic ketoacidoses) 05/21/2016  . Type I (juvenile type) diabetes mellitus without mention of complication, uncontrolled 10/05/2010  . Hypertension 10/05/2010  . Obesity 10/05/2010    Past Surgical History:  Procedure Laterality Date  . INCISION AND DRAINAGE PERIRECTAL ABSCESS N/A 06/16/2017   Procedure: IRRIGATION AND DEBRIDEMENT PERIRECTAL ABSCESS;  Surgeon: Florene Glen, MD;  Location: [facility] ORS;  Service: General;  Laterality: N/A;  . none      Prior to Admission medications   Medication Sig Start Date End Date Taking? Authorizing Provider  blood glucose meter kit and supplies KIT Dispense based on patient  and insurance preference. Use up to four times daily as directed. (FOR ICD-9 250.00, 250.01). 08/02/19   Hinda Kehr, MD  insulin aspart protamine- aspart (NOVOLOG MIX 70/30) (70-30) 100 UNIT/ML injection Inject 0.2 mLs (20 Units total) into the skin 2 (two) times daily with a meal. 08/02/19   Hinda Kehr, MD  insulin glargine (LANTUS) 100 UNIT/ML injection Inject 30 Units into the skin at bedtime.    [provider]  lisinopril (ZESTRIL) 40 MG tablet Take 1 tablet (40 mg total) by mouth daily. 09/11/18   Hillary Bow, MD  omeprazole (PRILOSEC) 40 MG capsule Take 1 capsule (40 mg total) by mouth daily. 03/08/20 05/07/20  Arta Silence, MD  oxyCODONE-acetaminophen (PERCOCET) 5-325 MG tablet Take 1 tablet by mouth every 4 (four) hours as needed for severe pain. 01/02/20 01/01/21  Fisher, Linden Dolin, PA-C  promethazine (PHENERGAN) 25 MG tablet Take 1 tablet (25 mg total) by mouth every 6 (six) hours as needed for nausea or vomiting. 06/26/19   Duffy Bruce, MD  pantoprazole (PROTONIX) 40 MG tablet Take 1 tablet (40 mg total) by mouth daily. Patient not taking: Reported on 12/10/2018 09/10/18 01/02/20  Hillary Bow, MD  sucralfate (CARAFATE) 1 g tablet Take 1 tablet (1 g total) by mouth 4 (four) times daily. Patient not taking: Reported on 06/26/2019 02/04/19 01/02/20  Nance Pear, MD    Allergies Patient has no known allergies.  Family History  Problem Relation Age of Onset  . Diabetes Mother     Social History Social History   Tobacco Use  .  Smoking status: Current Every Day Smoker    Packs/day: 0.50    Types: Cigarettes  . Smokeless tobacco: Never Used  Vaping Use  . Vaping Use: Never used  Substance Use Topics  . Alcohol use: No  . Drug use: Yes    Types: Marijuana    Review of Systems Constitutional: Positive for generalized weakness. Eyes: No visual changes. ENT: No sore throat. Cardiovascular: Denies chest pain. Respiratory: Denies shortness of  breath. Gastrointestinal: Positive for decreased appetite.  Genitourinary: Negative for dysuria. Musculoskeletal: Positive for body aches. Skin: Negative for rash. Neurological: Negative for headaches, focal weakness or numbness.  ____________________________________________   PHYSICAL EXAM:  VITAL SIGNS: ED Triage Vitals [06/10/20 1656]  Enc Vitals Group     BP 100/72     Pulse Rate (!) 118     Resp 18     Temp 98 F (36.7 C)     Temp Source Oral     SpO2 99 %     Weight 130 lb (59 kg)     Height 5' 9"  (1.753 m)     Head Circumference      Peak Flow      Pain Score 6   Constitutional: Alert and oriented.  Eyes: Conjunctivae are normal.  ENT      Head: Normocephalic and atraumatic.      Nose: No congestion/rhinnorhea.      Mouth/Throat: Mucous membranes are moist.      Neck: No stridor. Hematological/Lymphatic/Immunilogical: No cervical lymphadenopathy. Cardiovascular: Tachycardic, regular rhythm.  No murmurs, rubs, or gallops.  Respiratory: Normal respiratory effort without tachypnea nor retractions. Breath sounds are clear and equal bilaterally. No wheezes/rales/rhonchi. Gastrointestinal: Soft and non tender. No rebound. No guarding.  Genitourinary: Deferred Musculoskeletal: Normal range of motion in all extremities. No lower extremity edema. Neurologic:  Normal speech and language. No gross focal neurologic deficits are appreciated.  Skin:  Skin is warm, dry and intact. No rash noted. Psychiatric: Mood and affect are normal. Speech and behavior are normal. Patient exhibits appropriate insight and judgment.  ____________________________________________    LABS (pertinent positives/negatives)  CBC wbc 5.2, hgb 9.5, plt 231 CMP na 140, k 5.0, glu 174, cr 5.34  ____________________________________________   EKG  None  ____________________________________________     RADIOLOGY  None  ____________________________________________   PROCEDURES  Procedures  ____________________________________________   INITIAL IMPRESSION / ASSESSMENT AND PLAN / ED COURSE  Pertinent labs & imaging results that were available during my care of the patient were reviewed by me and considered in my medical decision making (see chart for details).   Patient presented to the emergency department today because of concerns for elevated creatinine found on blood work yesterday.  Patient's creatinine is quite elevated here today.  Will plan on admission.  Will give IV fluids.  Discussed this with the patient.  Also discussed with nephrology who recommended obtaining imaging at this time.  ___________________________________________   FINAL CLINICAL IMPRESSION(S) / ED DIAGNOSES  Final diagnoses:  AKI (acute kidney injury) ([place])     Note: This dictation was prepared with Dragon dictation. Any transcriptional errors that result from this process are unintentional     Nance Pear, MD 06/10/20 2000

## 2020-06-10 NOTE — ED Triage Notes (Signed)
Pt ambulatory to triage without difficulty states was called by his PCP and told that blood work he had done yesterday showed his "renal function was off".  PT was advised to come here for further evaluation.

## 2020-06-10 NOTE — ED Notes (Signed)
Pt states generalized body pain 8/10 started last night. Today got call from PCP informing of abnormal kidney labs. Pt advised to come to ED. Pt resting in bed.

## 2020-06-10 NOTE — ED Notes (Signed)
EDP at bedside speaking with pt.

## 2020-06-10 NOTE — H&P (Addendum)
PATIENT NAME: Jonathan Mcmillan    MR#:  [id number]  DATE OF BIRTH:  [birth year]  DATE OF ADMISSION:  06/10/2020  PRIMARY CARE PHYSICIAN: [facility] [place]   REQUESTING/REFERRING PHYSICIAN: Nance Pear, MD CHIEF COMPLAINT:   Chief Complaint  Patient presents with  . abnormal lab work    HISTORY OF PRESENT ILLNESS:  Jonathan Mcmillan  is a 25 y.o. African-American male, coming from home, with a known history of diabetes mellitus, gastroparesis, GERD and hypertension who presented to the emergency room with abnormal lab work with elevated renal functions that were found on routine physical lab that was drawn yesterday by his PCP.  He has not been feeling well recently with mild generalized weakness over the last 2 to 3 days.  He was diagnosed with Covid-19 on 05/22/2020.  He was having chest congestion which has improved but he continues to feel weak every morning.  He he denies any cough or wheezing.  No fever or chills.  No dysuria, oliguria or hematuria or flank pain.  He has been having heartburn for which she takes as needed Reglan.  No nausea or vomiting or diarrhea.  He has not been vaccinated for COVID-19.  He continues to smoke 3 cigarettes/day and a joint of marijuana daily.  Upon presentation to the ER, heart rate was 118 with otherwise normal vital signs.  Labs revealed anemia with hemoglobin of 9.5 and hematocrit 29 compared to 10.2/31 on 03/08/2020.  CMP revealed a potassium of 5, glucose of 174 BUN of 43 and creatinine 5.34 compared to 18/2.3 on 03/08/2020 with a GFR of 14 compared to 39.  The patient was given a liter bolus of IV normal saline.  After phone consultation with nephrology renal ultrasound and urine protein as well as urinalysis was ordered.  He will be admitted to a medical bed for further evaluation and management.  PAST MEDICAL HISTORY:   Past Medical History:  Diagnosis Date  . Cannabinoid hyperemesis syndrome   .  Diabetes 1.5, managed as type 1 ([place])   . Gastroparesis   . Perirectal abscess 06/16/2017    PAST SURGICAL HISTORY:   Past Surgical History:  Procedure Laterality Date  . INCISION AND DRAINAGE PERIRECTAL ABSCESS N/A 06/16/2017   Procedure: IRRIGATION AND DEBRIDEMENT PERIRECTAL ABSCESS;  Surgeon: Florene Glen, MD;  Location: [facility] ORS;  Service: General;  Laterality: N/A;  . none      SOCIAL HISTORY:   Social History   Tobacco Use  . Smoking status: Current Every Day Smoker    Packs/day: 0.50    Types: Cigarettes  . Smokeless tobacco: Never Used  Substance Use Topics  . Alcohol use: No    FAMILY HISTORY:   Family History  Problem Relation Age of Onset  . Diabetes Mother     DRUG ALLERGIES:  No Known Allergies  REVIEW OF SYSTEMS:   Review of Systems  Constitutional: Negative for fever.   As per history of present illness. All pertinent systems were reviewed above. Constitutional, HEENT, cardiovascular, respiratory, GI, GU, musculoskeletal, neuro, psychiatric, endocrine, integumentary and hematologic systems were reviewed and are otherwise negative/unremarkable except for positive findings mentioned above in the HPI.   MEDICATIONS AT HOME:   Prior to Admission medications   Medication Sig Start Date End Date Taking? Authorizing Provider  blood glucose meter kit and supplies KIT Dispense based on patient and insurance preference. Use up to four times daily as directed. (FOR ICD-9  250.00, 250.01). 08/02/19   Hinda Kehr, MD  insulin aspart protamine- aspart (NOVOLOG MIX 70/30) (70-30) 100 UNIT/ML injection Inject 0.2 mLs (20 Units total) into the skin 2 (two) times daily with a meal. 08/02/19   Hinda Kehr, MD  insulin glargine (LANTUS) 100 UNIT/ML injection Inject 30 Units into the skin at bedtime.    [provider]  lisinopril (ZESTRIL) 40 MG tablet Take 1 tablet (40 mg total) by mouth daily. 09/11/18   Hillary Bow, MD  omeprazole (PRILOSEC) 40 MG  capsule Take 1 capsule (40 mg total) by mouth daily. 03/08/20 05/07/20  Arta Silence, MD  oxyCODONE-acetaminophen (PERCOCET) 5-325 MG tablet Take 1 tablet by mouth every 4 (four) hours as needed for severe pain. 01/02/20 01/01/21  Fisher, Linden Dolin, PA-C  promethazine (PHENERGAN) 25 MG tablet Take 1 tablet (25 mg total) by mouth every 6 (six) hours as needed for nausea or vomiting. 06/26/19   Duffy Bruce, MD  pantoprazole (PROTONIX) 40 MG tablet Take 1 tablet (40 mg total) by mouth daily. Patient not taking: Reported on 12/10/2018 09/10/18 01/02/20  Hillary Bow, MD  sucralfate (CARAFATE) 1 g tablet Take 1 tablet (1 g total) by mouth 4 (four) times daily. Patient not taking: Reported on 06/26/2019 02/04/19 01/02/20  Nance Pear, MD      VITAL SIGNS:  Blood pressure 100/72, pulse (!) 118, temperature 98 F (36.7 C), temperature source Oral, resp. rate 18, height _0  (1.753 m), weight 59 kg, SpO2 99 %.  PHYSICAL EXAMINATION:  Physical Exam  GENERAL:  25 y.o.-year-old African-American male patient lying in the bed with no acute distress.  EYES: Pupils equal, round, reactive to light and accommodation. No scleral icterus. Extraocular muscles intact.  HEENT: Head atraumatic, normocephalic. Oropharynx and nasopharynx clear.  NECK:  Supple, no jugular venous distention. No thyroid enlargement, no tenderness.  LUNGS: Normal breath sounds bilaterally, no wheezing, rales,rhonchi or crepitation. No use of accessory muscles of respiration.  CARDIOVASCULAR: Regular rate and rhythm, S1, S2 normal. No murmurs, rubs, or gallops.  ABDOMEN: Soft, nondistended, nontender. Bowel sounds present. No organomegaly or mass.  EXTREMITIES: No pedal edema, cyanosis, or clubbing.  NEUROLOGIC: Cranial nerves II through XII are intact. Muscle strength 5/5 in all extremities. Sensation intact. Gait not checked.  PSYCHIATRIC: The patient is alert and oriented x 3.  Normal affect and good eye contact. SKIN: No  obvious rash, lesion, or ulcer.   LABORATORY PANEL:   CBC Recent Labs  Lab 06/10/20 1705  WBC 5.2  HGB 9.5*  HCT 29.0*  PLT 231   ------------------------------------------------------------------------------------------------------------------  Chemistries  Recent Labs  Lab 06/10/20 1705  NA 140  K 5.0  CL 97*  CO2 28  GLUCOSE 174*  BUN 43*  CREATININE 5.34*  CALCIUM 8.9  AST 13*  ALT 17  ALKPHOS 94  BILITOT 1.2   ------------------------------------------------------------------------------------------------------------------  Cardiac Enzymes No results for input(s): TROPONINI in the last 168 hours. ------------------------------------------------------------------------------------------------------------------  RADIOLOGY:  No results found.    IMPRESSION AND PLAN:   1.  Acute kidney injury superimposed on stage IIIb chronic kidney disease. -The patient will be admitted to a medical bed. -He will be hydrated with IV normal saline. -We will follow serial BMPs. -We will follow his renal ultrasound as well as urine protein and urinalysis. -Nephrotoxins will be held off. -Nephrology consultation will be obtained. -I notified Dr. Theador Hawthorne about the patient.  2.  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 -The patient will  be continued on his basal coverage and will place him on supplemental coverage with NovoLog. -No current signs of gastroparesis flare.  3.  Essential hypertension. -His Zestril be held off and he will be placed on as needed IV labetalol.  4.  GERD. -PPI therapy will be resumed.  5.  Ongoing tobacco and marijuana abuse. -I counseled him for cessation and he will receive further counseling here.  6. DVT prophylaxis. -Subcutaneous heparin.  All the records are reviewed and case discussed with ED provider. The plan of care was discussed in details with the patient (and family). I  answered all questions. The patient agreed to proceed with the above mentioned plan. Further management will depend upon hospital course.   CODE STATUS: Full code  Status is: Inpatient  Remains inpatient appropriate because:Ongoing diagnostic testing needed not appropriate for outpatient work up, Unsafe d/c plan, IV treatments appropriate due to intensity of illness or inability to take PO and Inpatient level of care appropriate due to severity of illness   Dispo: The patient is from: Home              Anticipated d/c is to: Home              Anticipated d/c date is: 2 days              Patient currently is not medically stable to d/c.   Difficult to place patient No  TOTAL TIME TAKING CARE OF THIS PATIENT: 55 minutes.    Christel Mormon M.D on 06/10/2020 at 7:55 PM  [facility]   From 7 PM-7 AM, contact night-coverage [email or web address]  CC: Primary care physician; [facility] [place]

## 2020-06-10 NOTE — ED Notes (Signed)
Took over care of pt. Pt resting at this time. VSS. Awaiting further orders. Will continue to monitor.

## 2020-06-11 ENCOUNTER — Inpatient Hospital Stay: Payer: Self-pay

## 2020-06-11 DIAGNOSIS — F1910 Other psychoactive substance abuse, uncomplicated: Secondary | ICD-10-CM

## 2020-06-11 DIAGNOSIS — E108 Type 1 diabetes mellitus with unspecified complications: Secondary | ICD-10-CM

## 2020-06-11 DIAGNOSIS — N179 Acute kidney failure, unspecified: Secondary | ICD-10-CM | POA: Diagnosis present

## 2020-06-11 DIAGNOSIS — R110 Nausea: Secondary | ICD-10-CM

## 2020-06-11 LAB — BASIC METABOLIC PANEL
Anion gap: 10 (ref 5–15)
BUN: 40 mg/dL — ABNORMAL HIGH (ref 6–20)
CO2: 28 mmol/L (ref 22–32)
Calcium: 8.2 mg/dL — ABNORMAL LOW (ref 8.9–10.3)
Chloride: 102 mmol/L (ref 98–111)
Creatinine, Ser: 4.93 mg/dL — ABNORMAL HIGH (ref 0.61–1.24)
GFR, Estimated: 16 mL/min — ABNORMAL LOW (ref >60)
Glucose, Bld: 58 mg/dL — ABNORMAL LOW (ref 70–99)
Potassium: 4.8 mmol/L (ref 3.5–5.1)
Sodium: 140 mmol/L (ref 135–145)

## 2020-06-11 LAB — CBC
HCT: 24.9 % — ABNORMAL LOW (ref 39.0–52.0)
Hemoglobin: 8.3 g/dL — ABNORMAL LOW (ref 13.0–17.0)
MCH: 30.6 pg (ref 26.0–34.0)
MCHC: 33.3 g/dL (ref 30.0–36.0)
MCV: 91.9 fL (ref 80.0–100.0)
Platelets: 187 10*3/uL (ref 150–400)
RBC: 2.71 MIL/uL — ABNORMAL LOW (ref 4.22–5.81)
RDW: 12.3 % (ref 11.5–15.5)
WBC: 4.2 10*3/uL (ref 4.0–10.5)
nRBC: 0 % (ref 0.0–0.2)

## 2020-06-11 LAB — IRON AND TIBC
Iron: 102 ug/dL (ref 45–182)
Saturation Ratios: 42 % — ABNORMAL HIGH (ref 17.9–39.5)
TIBC: 245 ug/dL — ABNORMAL LOW (ref 250–450)
UIBC: 143 ug/dL

## 2020-06-11 LAB — CBG MONITORING, ED
Glucose-Capillary: 171 mg/dL — ABNORMAL HIGH (ref 70–99)
Glucose-Capillary: 49 mg/dL — ABNORMAL LOW (ref 70–99)
Glucose-Capillary: 54 mg/dL — ABNORMAL LOW (ref 70–99)
Glucose-Capillary: 137 mg/dL — ABNORMAL HIGH (ref 70–99)
Glucose-Capillary: 108 mg/dL — ABNORMAL HIGH (ref 70–99)
Glucose-Capillary: 93 mg/dL (ref 70–99)
Glucose-Capillary: 89 mg/dL (ref 70–99)

## 2020-06-11 LAB — PHOSPHORUS: Phosphorus: 4.5 mg/dL (ref 2.5–4.6)

## 2020-06-11 LAB — FOLATE: Folate: 11.2 ng/mL (ref >5.9)

## 2020-06-11 LAB — RETICULOCYTES
Immature Retic Fract: 8.5 % (ref 2.3–15.9)
RBC.: 2.69 MIL/uL — ABNORMAL LOW (ref 4.22–5.81)
Retic Count, Absolute: 32.3 10*3/uL (ref 19.0–186.0)
Retic Ct Pct: 1.2 % (ref 0.4–3.1)

## 2020-06-11 LAB — HIV ANTIBODY (ROUTINE TESTING W REFLEX): HIV Screen 4th Generation wRfx: NONREACTIVE

## 2020-06-11 LAB — FERRITIN: Ferritin: 138 ng/mL (ref 24–336)

## 2020-06-11 LAB — PROTEIN, URINE, RANDOM: Total Protein, Urine: 276 mg/dL

## 2020-06-11 MED ORDER — DEXTROSE 10 % IV SOLN: INTRAVENOUS | Status: DC

## 2020-06-11 MED ORDER — ALUM & MAG HYDROXIDE-SIMETH 200-200-20 MG/5ML PO SUSP
30.0000 mL | ORAL | Status: DC | PRN
  Administered 2020-06-11: 30 mL via ORAL
  Filled 2020-06-11: qty 30

## 2020-06-11 MED ORDER — TRAMADOL HCL 50 MG PO TABS
50.0000 mg | ORAL_TABLET | Freq: Two times a day (BID) | ORAL | Status: DC | PRN
  Administered 2020-06-11 – 2020-06-13 (×2): 50 mg via ORAL
  Filled 2020-06-11 (×2): qty 1

## 2020-06-11 NOTE — Consult Note (Signed)
Jonathan Mcmillan MRN: [id number] DOB/AGE: Mar 17, 1996 25 y.o. Primary Care Physician:[facility] [place] date: 06/10/2020 Chief Complaint:  Chief Complaint  Patient presents with  . abnormal lab work   HPI: Patient is a 25 year old African-American male with a past medical history of diabetes mellitus, diabetic gastroparesis, hypertension who came to the ER with chief complaint of abdominal labs.  History of present illness date back to January 9 and patient was diagnosed with COVID-19.  Patient main complaint at that time was weakness.  Patient thereafter had labs done at his primary care office yesterday and after receiving the lab patient was asked to come to the ER.  Upon evaluation in the ER patient was found to have anemia with hemoglobin of 9.5, creatinine of 5.3 and patient was admitted for further treatment.  Patient was seen today in the ER Patient offers no specific complaints-patient main complaint was" I do not want to go on dialysis" Patient offers no complaint of chest pain/shortness of breath No complaint of fever cough or chills No complaint of hematuria. No complaint of over-the-counter NSAID use No complaint of change in speech or change in vision No complaint of syncope No complaint of recent sore throat Patient does give a history of having had diabetes mellitus from past 10+ years  Past Medical History:  Diagnosis Date  . Cannabinoid hyperemesis syndrome   . Diabetes 1.5, managed as type 1 ([place])   . Gastroparesis   . Perirectal abscess 06/16/2017        Family History  Problem Relation Age of Onset  . Diabetes Mother   Mother and sister on HD    Social History:  reports that he has been smoking cigarettes. He has been smoking about 0.50 packs per day. He has never used smokeless tobacco. He reports current drug use. Drug: Marijuana. He reports that he does not drink alcohol.   Allergies: No Known Allergies  (Not in a hospital  admission)      [id number] from the symptoms mentioned above,there are no other symptoms referable to all systems reviewed.  . heparin  5,000 Units Subcutaneous Q8H  . insulin aspart  0-15 Units Subcutaneous TID PC & HS  . insulin aspart protamine- aspart  20 Units Subcutaneous BID WC  . insulin glargine  30 Units Subcutaneous QHS  . pantoprazole  40 mg Oral Daily      Physical Exam: Vital signs in last 24 hours: Temp:  [98 F (36.7 C)] 98 F (36.7 C) (01/28 1656) Pulse Rate:  [84-118] 98 (01/29 0945) Resp:  [11-19] 19 (01/29 0945) BP: (100-162)/(72-101) 126/80 (01/29 0945) SpO2:  [98 %-100 %] 99 % (01/29 0945) Weight:  [59 kg] 59 kg (01/28 1656) Weight change:     Intake/Output from previous day: 01/28 0701 - 01/29 0700 In: 999 [IV Piggyback:999] Out: -  No intake/output data recorded.   Physical Exam: General- pt is awake,alert, oriented to time place and person HEENT-head is atraumatic normocephalic Resp- No acute REsp distress, CTA B/L NO Rhonchi CVS- S1S2 regular in rate and rhythm GIT- BS+, soft, NT, ND EXT- NO LE Edema, Cyanosis CNS- CN 2-12 grossly intact. Moving all 4 extremities Psych- normal mood and affect    Lab Results: CBC Recent Labs    06/10/20 1705 06/11/20 0341  WBC 5.2 4.2  HGB 9.5* 8.3*  HCT 29.0* 24.9*  PLT 231 187    BMET Recent Labs    06/10/20 1705 06/11/20 0341  NA 140 140  K 5.0 4.8  CL 97* 102  CO2 28 28  GLUCOSE 174* 58*  BUN 43* 40*  CREATININE 5.34* 4.93*  CALCIUM 8.9 8.2*   Creatinine trend 2022 5.3==>4.9 2021 1.6--2.2 2020 1.3--2.0-AKI 2019 0.5--1.0 2018 0.9--1.8-AKI   Patient urine does show proteinuria going back to 2018  MICRO No results found for this or any previous visit (from the past 240 hour(s)).    Lab Results  Component Value Date   CALCIUM 8.2 (L) 06/11/2020   CAION 1.32 05/10/2007   Renal ultrasound done on January 28 Right Kidney:  Renal measurements: 9.5 x 5.7 x 6.2 cm. =  volume: 175 mL. Echogenicity within normal limits. No mass or hydronephrosis visualized.  Left Kidney:  Renal measurements: 9.6 x 6.2 x 6.1 cm. = volume: 194 mL. Echogenicity within normal limits. No mass or hydronephrosis visualized.  Bladder:  Appears normal for degree of bladder distention.  Other:  None.  IMPRESSION: Normal-appearing renal ultrasound.     Impression: 1)Renal   AKI secondary to ATN Patient has ATN most likely secondary to poor p.o. intake/recent diagnosis of Covid? AKI on CKD CKD stage IIIb Patient has CKD stage III since 2018  Creatinine trend 2022 5.3==>4.9 2021 1.6--2.2 2020 1.3--2.0-AKI 2019 0.5--1.0 2018 0.9--1.8-AKI    Patient has CKD stage most likely secondary to diabetes mellitus Possible contribution from history of hypertension as well Patient also does have a strong family history of ESRD Patient's mother and sibling both on dialysis Patient also does have a history of cannabinoid abuse    2)HTN Blood pressure stable  3)Anemia of chronic disease? HGb is not at goal (9--11) We will check anemia profile  4) secondary hyperparathyroidism CKD Mineral-Bone Disorder PTH not avail  secondary Hyperparathyroidism w/u pending  phosphorus will check   5) tobacco abuse  Educated patient on importance of quitting smoking  6) electrolytes  normokalemic NOrmonatremic   7)Acid base Co2 at goal   8) proteinuria Patient UA going back to 2018 shows proteinuria We will quantify patient proteinuria     Plan:  Will increase IV fluids rate We will ask for CKD work-up We will ask for autoimmune work-up We will ask for anemia work-up We will ask for  secondary hyperparathyroidism work-up We will quantify proteinuria I reviewed the patient what importance of quitting smoking Educated patient about his current GFR No need for renal replacement therapy today   [name] s Theador Hawthorne 06/11/2020, 10:52 AM

## 2020-06-11 NOTE — ED Notes (Signed)
Admitting MD Joellyn Quails regarding pt request for more pain medicine at this time. Awaiting further orders.

## 2020-06-11 NOTE — ED Notes (Signed)
Glucose of 58 noted on BMP. Patient given apple juice.

## 2020-06-11 NOTE — Progress Notes (Signed)
[place] [facility] [place] NAME: Jonathan Mcmillan    MR#:  [id number]  DATE OF BIRTH:  11/15/1995  SUBJECTIVE:  patient appears frustrated and asking when he can go home. Came in after he was found to have abnormal labs. Was diagnosed with COVID-19 on nine January. Has been having poor PO intake. Denies any nausea vomiting. Tells me as gastroparesis however no episodes of vomiting. Drinking his apple juice. Received insulin yesterday and sugars have been 49 and 58 this morning. Starting some dextrose drip.  REVIEW OF SYSTEMS:   Review of Systems  Constitutional: Negative for chills, fever and weight loss.  HENT: Negative for ear discharge, ear pain and nosebleeds.   Eyes: Negative for blurred vision, pain and discharge.  Respiratory: Negative for sputum production, shortness of breath, wheezing and stridor.   Cardiovascular: Negative for chest pain, palpitations, orthopnea and PND.  Gastrointestinal: Positive for nausea. Negative for abdominal pain, diarrhea and vomiting.  Genitourinary: Negative for frequency and urgency.  Musculoskeletal: Negative for back pain and joint pain.  Neurological: Positive for weakness. Negative for sensory change, speech change and focal weakness.  Psychiatric/Behavioral: Negative for depression and hallucinations. The patient is not nervous/anxious.    Tolerating Diet: Tolerating PT:   DRUG ALLERGIES:  No Known Allergies  VITALS:  Blood pressure 125/78, pulse 96, temperature 98 F (36.7 C), temperature source Oral, resp. rate 19, height '5\' 9"'$  (1.753 m), weight 59 kg, SpO2 99 %.  PHYSICAL EXAMINATION:   Physical Exam  GENERAL:  25 y.o.-year-old patient lying in the bed with no acute distress.  LUNGS: Normal breath sounds bilaterally, no wheezing, rales, rhonchi. No use of accessory muscles of respiration.  CARDIOVASCULAR: S1, S2 normal. No murmurs, rubs, or gallops.  ABDOMEN: Soft, nontender, nondistended. Bowel  sounds present. No organomegaly or mass.  EXTREMITIES: No cyanosis, clubbing or edema b/l.    NEUROLOGIC: grossly nonfocal PSYCHIATRIC:  patient is alert and oriented x 3.  SKIN: No obvious rash, lesion, or ulcer.   LABORATORY PANEL:  CBC Recent Labs  Lab 06/11/20 0341  WBC 4.2  HGB 8.3*  HCT 24.9*  PLT 187    Chemistries  Recent Labs  Lab 06/10/20 1705 06/11/20 0341  NA 140 140  K 5.0 4.8  CL 97* 102  CO2 28 28  GLUCOSE 174* 58*  BUN 43* 40*  CREATININE 5.34* 4.93*  CALCIUM 8.9 8.2*  AST 13*  --   ALT 17  --   ALKPHOS 94  --   BILITOT 1.2  --    Cardiac Enzymes No results for input(s): TROPONINI in the last 168 hours. RADIOLOGY:  US Renal  Result Date: 06/10/2020 CLINICAL DATA:  Acute renal injury EXAM: RENAL / URINARY TRACT ULTRASOUND COMPLETE COMPARISON:  None. FINDINGS: Right Kidney: Renal measurements: 9.5 x 5.7 x 6.2 cm. = volume: 175 mL. Echogenicity within normal limits. No mass or hydronephrosis visualized. Left Kidney: Renal measurements: 9.6 x 6.2 x 6.1 cm. = volume: 194 mL. Echogenicity within normal limits. No mass or hydronephrosis visualized. Bladder: Appears normal for degree of bladder distention. Other: None. IMPRESSION: Normal-appearing renal ultrasound. Electronically Signed   By: Inez Catalina M.D.   On: 06/10/2020 19:58   ASSESSMENT AND PLAN:   Cejay Hoeksema  is a 25 y.o. African-American male, coming from home, with a known history of diabetes mellitus, gastroparesis, GERD and hypertension who presented to the emergency room with abnormal lab work with elevated renal functions that were  found on routine physical lab that was drawn yesterday by his PCP.  He has not been feeling well recently with mild generalized weakness over the last 2 to 3 days.  He was diagnosed with Covid-19 on 05/22/2020.   Acute kidney injury superimposed on stage IIIb chronic kidney disease. - IV normal saline. -came in with creat 5.53--4.93 - renal ultrasound no acute  abnormality. -Avoid Nephrotoxins  -Nephrology consultation Dr. 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 Hypoglycemia due to poor po intake --No current signs of gastroparesis flare. --IV dextrose 10% at 50 cc/hr --Hold insulin  Essential hypertension. -Zestril--on hold  GERD. -PPI therapy will be resumed.  Ongoing tobacco and marijuana abuse. -counseled him for cessation and he will receive further counseling here.  DVT prophylaxis. -Subcutaneous heparin.  Recent COVID 19 infection --positive on 05/23/2019 (per Epic)  CODE STATUS: Full code  Status is: Inpatient  Dispo: The patient is from: Home  Anticipated d/c is to: Home  Anticipated d/c date is: 2-3days  Patient currently is not medically stable to d/c.              Difficult to place patient No  Level of care: Med-Surg treating for acute on chronic kidney disease with dehydration nephrology consultation pending      TOTAL TIME TAKING CARE OF THIS PATIENT: *25* minutes.  >50% time spent on counselling and coordination of care  Note: This dictation was prepared with Dragon dictation along with smaller phrase technology. Any transcriptional errors that result from this process are unintentional.  Fritzi Mandes M.D    [facility]   CC: [facility] care physician; [facility] HealthPatient ID: MALO PRAYER, male   DOB: Aug 25, 1995, 25 y.o.   MRN: [id number]

## 2020-06-11 NOTE — ED Notes (Signed)
Admitting MD Joellyn Quails regarding pt CBG level at this time. Pt given juice and meal tray and encouraged to eat/drink at this time. Awaiting new orders at this time

## 2020-06-11 NOTE — ED Notes (Signed)
Pt given apple juice at this time due to CBG of 58.

## 2020-06-11 NOTE — ED Notes (Signed)
Took over care of pt. Pt resting comfortably. Pt in NAD at this time. VSS. Awaiting further orders. Will continue to monitor.  

## 2020-06-11 NOTE — ED Notes (Signed)
Pt given meal tray at this time. Pt also given unit phone to update family at this time

## 2020-06-11 NOTE — ED Notes (Signed)
Patient given water per request.

## 2020-06-11 NOTE — ED Notes (Signed)
Patient's aunt, Lynelle Smoke updated after receiving permission from patient.

## 2020-06-11 NOTE — ED Notes (Signed)
Admitting MD Joellyn Quails regarding pt concerns at this time.

## 2020-06-12 LAB — URINE DRUG SCREEN, QUALITATIVE (ARMC ONLY)
Amphetamines, Ur Screen: NOT DETECTED
Barbiturates, Ur Screen: NOT DETECTED
Benzodiazepine, Ur Scrn: NOT DETECTED
Cannabinoid 50 Ng, Ur ~~LOC~~: POSITIVE — AB
Cocaine Metabolite,Ur ~~LOC~~: NOT DETECTED
MDMA (Ecstasy)Ur Screen: NOT DETECTED
Methadone Scn, Ur: NOT DETECTED
Opiate, Ur Screen: NOT DETECTED
Phencyclidine (PCP) Ur S: NOT DETECTED
Tricyclic, Ur Screen: NOT DETECTED

## 2020-06-12 LAB — BASIC METABOLIC PANEL
Anion gap: 7 (ref 5–15)
BUN: 29 mg/dL — ABNORMAL HIGH (ref 6–20)
CO2: 24 mmol/L (ref 22–32)
Calcium: 8.1 mg/dL — ABNORMAL LOW (ref 8.9–10.3)
Chloride: 103 mmol/L (ref 98–111)
Creatinine, Ser: 4.13 mg/dL — ABNORMAL HIGH (ref 0.61–1.24)
GFR, Estimated: 19 mL/min — ABNORMAL LOW (ref >60)
Glucose, Bld: 96 mg/dL (ref 70–99)
Potassium: 4.6 mmol/L (ref 3.5–5.1)
Sodium: 134 mmol/L — ABNORMAL LOW (ref 135–145)

## 2020-06-12 LAB — VITAMIN D 25 HYDROXY (VIT D DEFICIENCY, FRACTURES): Vit D, 25-Hydroxy: 16.37 ng/mL — ABNORMAL LOW (ref 30–100)

## 2020-06-12 LAB — HEPATITIS PANEL, ACUTE
HCV Ab: NONREACTIVE
Hep A IgM: NONREACTIVE
Hep B C IgM: NONREACTIVE
Hepatitis B Surface Ag: NONREACTIVE

## 2020-06-12 LAB — CBG MONITORING, ED
Glucose-Capillary: 93 mg/dL (ref 70–99)
Glucose-Capillary: 106 mg/dL — ABNORMAL HIGH (ref 70–99)
Glucose-Capillary: 110 mg/dL — ABNORMAL HIGH (ref 70–99)
Glucose-Capillary: 101 mg/dL — ABNORMAL HIGH (ref 70–99)

## 2020-06-12 LAB — GLUCOSE, CAPILLARY: Glucose-Capillary: 125 mg/dL — ABNORMAL HIGH (ref 70–99)

## 2020-06-12 LAB — VITAMIN B12: Vitamin B-12: 185 pg/mL (ref 180–914)

## 2020-06-12 MED ORDER — AMLODIPINE BESYLATE 10 MG PO TABS
10.0000 mg | ORAL_TABLET | Freq: Every day | ORAL | Status: DC
  Administered 2020-06-12 – 2020-06-13 (×2): 10 mg via ORAL
  Filled 2020-06-12: qty 2
  Filled 2020-06-12: qty 1

## 2020-06-12 MED ORDER — MORPHINE SULFATE (PF) 2 MG/ML IV SOLN
2.0000 mg | INTRAVENOUS | Status: DC | PRN
  Administered 2020-06-12 (×2): 2 mg via INTRAVENOUS
  Filled 2020-06-12 (×2): qty 1

## 2020-06-12 MED ORDER — METOCLOPRAMIDE HCL 5 MG/ML IJ SOLN
5.0000 mg | Freq: Four times a day (QID) | INTRAMUSCULAR | Status: DC | PRN
  Administered 2020-06-12: 5 mg via INTRAVENOUS
  Filled 2020-06-12: qty 2

## 2020-06-12 MED ORDER — HYDRALAZINE HCL 20 MG/ML IJ SOLN
10.0000 mg | Freq: Four times a day (QID) | INTRAMUSCULAR | Status: DC | PRN
  Administered 2020-06-12: 10 mg via INTRAVENOUS
  Filled 2020-06-12: qty 1

## 2020-06-12 MED ORDER — LABETALOL HCL 5 MG/ML IV SOLN
20.0000 mg | INTRAVENOUS | Status: DC | PRN
  Administered 2020-06-12 – 2020-06-13 (×3): 20 mg via INTRAVENOUS
  Filled 2020-06-12 (×3): qty 4

## 2020-06-12 MED ORDER — LABETALOL HCL 5 MG/ML IV SOLN
20.0000 mg | INTRAVENOUS | Status: DC | PRN
  Administered 2020-06-12: 20 mg via INTRAVENOUS
  Filled 2020-06-12: qty 4

## 2020-06-12 MED ORDER — OXYCODONE-ACETAMINOPHEN 5-325 MG PO TABS
1.0000 | ORAL_TABLET | Freq: Four times a day (QID) | ORAL | Status: DC | PRN
  Administered 2020-06-12 – 2020-06-13 (×4): 1 via ORAL
  Filled 2020-06-12 (×5): qty 1

## 2020-06-12 MED ORDER — EPOETIN ALFA 4000 UNIT/ML IJ SOLN
3000.0000 [IU] | INTRAMUSCULAR | Status: AC
  Administered 2020-06-12: 3000 [IU] via SUBCUTANEOUS
  Filled 2020-06-12: qty 1

## 2020-06-12 NOTE — ED Notes (Signed)
Pt stated coming into the ER for kidney issues and that he has diabetes. Pt states he is doing "a little bit better." Aunt called for update. RN asked if he could give her an update and pt stated he would and called her.

## 2020-06-12 NOTE — ED Notes (Signed)
Provider notified of BP 172/112

## 2020-06-12 NOTE — Progress Notes (Signed)
Jonathan Mcmillan at [place] NAME: Jonathan Mcmillan    MR#:  [id number]  DATE OF BIRTH:  1995-10-04  SUBJECTIVE:  patient frustrated again. Wants to go home. He does not like the food in the hospital. Continues to drink his apple juice. Sugars are stable in the upper 90s --100's no vomiting. Complains of abdominal pain however KUB normal. I have asked him to use urinal to measure urine output.  REVIEW OF SYSTEMS:   Review of Systems  Constitutional: Negative for chills, fever and weight loss.  HENT: Negative for ear discharge, ear pain and nosebleeds.   Eyes: Negative for blurred vision, pain and discharge.  Respiratory: Negative for sputum production, shortness of breath, wheezing and stridor.   Cardiovascular: Negative for chest pain, palpitations, orthopnea and PND.  Gastrointestinal: Positive for nausea. Negative for abdominal pain, diarrhea and vomiting.  Genitourinary: Negative for frequency and urgency.  Musculoskeletal: Negative for back pain and joint pain.  Neurological: Positive for weakness. Negative for sensory change, speech change and focal weakness.  Psychiatric/Behavioral: Negative for depression and hallucinations. The patient is not nervous/anxious.    Tolerating Diet: Tolerating PT:   DRUG ALLERGIES:  No Known Allergies  VITALS:  Blood pressure (!) 158/109, pulse 96, temperature 98.7 F (37.1 C), resp. rate (!) 21, height '5\' 9"'$  (1.753 m), weight 59 kg, SpO2 100 %.  PHYSICAL EXAMINATION:   Physical Exam  GENERAL:  25 y.o.-year-old patient lying in the bed with no acute distress.  LUNGS: Normal breath sounds bilaterally, no wheezing, rales, rhonchi. No use of accessory muscles of respiration.  CARDIOVASCULAR: S1, S2 normal. No murmurs, rubs, or gallops.  ABDOMEN: Soft, nontender, nondistended. Bowel sounds present. No organomegaly or mass.  EXTREMITIES: No cyanosis, clubbing or edema b/l.    NEUROLOGIC: grossly  nonfocal PSYCHIATRIC:  patient is alert and oriented x 3.  SKIN: No obvious rash, lesion, or ulcer.   LABORATORY PANEL:  CBC Recent Labs  Lab 06/11/20 0341  WBC 4.2  HGB 8.3*  HCT 24.9*  PLT 187    Chemistries  Recent Labs  Lab 06/10/20 1705 06/11/20 0341 06/12/20 0515  NA 140   < > 134*  K 5.0   < > 4.6  CL 97*   < > 103  CO2 28   < > 24  GLUCOSE 174*   < > 96  BUN 43*   < > 29*  CREATININE 5.34*   < > 4.13*  CALCIUM 8.9   < > 8.1*  AST 13*  --   --   ALT 17  --   --   ALKPHOS 94  --   --   BILITOT 1.2  --   --    < > = values in this interval not displayed.   Cardiac Enzymes No results for input(s): TROPONINI in the last 168 hours. RADIOLOGY:  DG Abdomen 1 View  Result Date: 06/11/2020 CLINICAL DATA:  Abdominal pain EXAM: ABDOMEN - 1 VIEW FINDINGS: No dilated large or small bowel. Gas and stool in the rectum. No organomegaly. No pathologic calcifications. No acute osseous abnormality. IMPRESSION: No acute abdominal findings plain film. Electronically Signed   By: Suzy Bouchard M.D.   On: 06/11/2020 16:31   US Renal  Result Date: 06/10/2020 CLINICAL DATA:  Acute renal injury EXAM: RENAL / URINARY TRACT ULTRASOUND COMPLETE COMPARISON:  None. FINDINGS: Right Kidney: Renal measurements: 9.5 x 5.7 x 6.2 cm. = volume: 175 mL. Echogenicity within normal  limits. No mass or hydronephrosis visualized. Left Kidney: Renal measurements: 9.6 x 6.2 x 6.1 cm. = volume: 194 mL. Echogenicity within normal limits. No mass or hydronephrosis visualized. Bladder: Appears normal for degree of bladder distention. Other: None. IMPRESSION: Normal-appearing renal ultrasound. Electronically Signed   By: Inez Catalina M.D.   On: 06/10/2020 19:58   ASSESSMENT AND PLAN:   Jonathan Mcmillan  is a 25 y.o. African-American male, coming from home, with a known history of diabetes mellitus, gastroparesis, GERD and hypertension who presented to the emergency room with abnormal lab work with elevated renal  functions that were found on routine physical lab that was drawn yesterday by his PCP.  He has not been feeling well recently with mild generalized weakness over the last 2 to 3 days.  He was diagnosed with Covid-19 on 05/22/2020.   Acute kidney injury superimposed on stage IIIb chronic kidney disease, history of juvenile diabetes, history of diabetic foot - IV normal saline. -came in with creat 5.53--4.93--4.1 - renal ultrasound no acute abnormality. -Avoid Nephrotoxins  -Nephrology consultation Dr. 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 Hypoglycemia due to poor po intake --No current signs of gastroparesis flare. --IV dextrose 10% at 50 cc/hr-- will DC today. Sugar stable --Holding  insulin  Essential hypertension. -Zestril--on hold -- blood pressure elevated will start on amlodipine and PRN hydralazine  GERD/gastroparesis/abdominal pain -PPI therapy -- Reglan as needed -- KB within normal limit  Ongoing tobacco and marijuana abuse. -counseled him for cessation   DVT prophylaxis. -Subcutaneous heparin.  Recent COVID 19 infection --positive on 05/23/2019 (per Epic) -- relatively asymptomatic. Sats stable  CODE STATUS: Full code  Status is: Inpatient  Dispo: The patient is from: Home  Anticipated d/c is to: Home  Anticipated d/c date is: 2-3days  Patient currently is not medically stable to d/c.              Difficult to place patient No  Level of care: Med-Surg treating for acute on chronic kidney disease with dehydration nephrology consultation       TOTAL TIME TAKING CARE OF THIS PATIENT: *25* minutes.  >50% time spent on counselling and coordination of care  Note: This dictation was prepared with Dragon dictation along with smaller phrase technology. Any transcriptional errors that result from this process are unintentional.  Fritzi Mandes M.D    [facility]   CC: Primary care physician; [facility] HealthPatient ID: Jonathan Mcmillan, male   DOB: 07-19-1995, 25 y.o.   MRN: [id number]

## 2020-06-12 NOTE — ED Notes (Signed)
Lunch tray taken into pt room. Pt states that he has asked for sandwich tray because he does not like the food they bring him. Pt informed that I will check to see if we have any

## 2020-06-12 NOTE — Progress Notes (Signed)
Jonathan Mcmillan  MRN: [id number]  DOB/AGE: December 12, 1995 25 y.o.  Primary Care Physician:[facility], [place] date: 06/10/2020  Chief Complaint:  Chief Complaint  Patient presents with  . abnormal lab work    S-Pt presented on  06/10/2020 with  Chief Complaint  Patient presents with  . abnormal lab work  .    Pt today feels better  Medications . amLODipine  10 mg Oral Daily  . heparin  5,000 Units Subcutaneous Q8H  . pantoprazole  40 mg Oral Daily         [id number] from the symptoms mentioned above,there are no other symptoms referable to all systems reviewed.  Physical Exam: Vital signs in last 24 hours: Temp:  [98.7 F (37.1 C)] 98.7 F (37.1 C) (01/29 1920) Pulse Rate:  [79-96] 93 (01/30 1300) Resp:  [0-24] 21 (01/30 0800) BP: (115-169)/(78-113) 153/106 (01/30 1300) SpO2:  [96 %-100 %] 96 % (01/30 1300) Weight change:     Intake/Output from previous day: No intake/output data recorded. No intake/output data recorded.   Physical Exam:  General- pt is awake,alert, oriented to time place and person  Resp- No acute REsp distress, CTA B/L NO Rhonchi  CVS- S1S2 regular in rate and rhythm  GIT- BS+, soft, Non tender , Non distended  EXT- No LE Edema,  No Cyanosis    Lab Results:  CBC  Recent Labs    06/10/20 1705 06/11/20 0341  WBC 5.2 4.2  HGB 9.5* 8.3*  HCT 29.0* 24.9*  PLT 231 187    BMET  Recent Labs    06/11/20 0341 06/12/20 0515  NA 140 134*  K 4.8 4.6  CL 102 103  CO2 28 24  GLUCOSE 58* 96  BUN 40* 29*  CREATININE 4.93* 4.13*  CALCIUM 8.2* 8.1*     Creatinine trend 2022 5.3==>4.9==>4.1 2021 1.6--2.2 2020 1.3--2.0-AKI 2019 0.5--1.0 2018 0.9--1.8-AKI   Most recent Creatinine trend  Lab Results  Component Value Date   CREATININE 4.13 (H) 06/12/2020   CREATININE 4.93 (H) 06/11/2020   CREATININE 5.34 (H) 06/10/2020      MICRO   No results found for this or any previous visit (from the  past 240 hour(s)).       Impression:   1)Renal   AKI secondary to ATN Patient has ATN most likely secondary to poor p.o. intake/recent diagnosis of Covid? AKI on CKD CKD stage IIIb Patient has CKD stage III since 2018  Creatinine trend 2022 5.3==>4.9==>4.1 2021 1.6--2.2 2020 1.3--2.0-AKI 2019 0.5--1.0 2018 0.9--1.8-AKI    Patient has CKD stage most likely secondary to diabetes mellitus Possible contribution from history of hypertension as well Patient also does have a strong family history of ESRD Patient's mother and sibling both on dialysis Patient also does have a history of cannabinoid abuse   AKI is improving Creatinine trending down   2)HTN    Blood pressure is stable    3)Anemia of chronic disease  CBC Latest Ref Rng & Units 06/11/2020 06/10/2020 03/08/2020  WBC 4.0 - 10.5 K/uL 4.2 5.2 4.6  Hemoglobin 13.0 - 17.0 g/dL 8.3(L) 9.5(L) 10.2(L)  Hematocrit 39.0 - 52.0 % 24.9(L) 29.0(L) 31.0(L)  Platelets 150 - 400 K/uL 187 231 237    Results for Jonathan Mcmillan (MRN [id number]) as of 06/12/2020 09:46  Ref. Range 06/11/2020 17:39  Iron Latest Ref Range: 45 - 182 ug/dL 102  UIBC Latest Units: ug/dL 143  TIBC Latest Ref Range: 250 - 450 ug/dL 245 (L)  Saturation Ratios Latest Ref Range: 17.9 - 39.5 % 42 (H)  Ferritin Latest Ref Range: 24 - 336 ng/mL 138  Folate Latest Ref Range: >5.9 ng/mL 11.2   Results for Jonathan Mcmillan (MRN [id number]) as of 06/12/2020 09:46  Ref. Range 06/11/2020 17:12  Vitamin B12 Latest Ref Range: 180 - 914 pg/mL 185      HGb is not at goal (9--11)  Patient iron saturation/vitamin B12/folate are within normal limits We will start patient on Epogen   4) Secondary hyperparathyroidism -CKD Mineral-Bone Disorder    Lab Results  Component Value Date   CALCIUM 8.1 (L) 06/12/2020   CAION 1.32 05/10/2007   PHOS 4.5 06/11/2020    Secondary Hyperparathyroidism absent.  Phosphorus at goal.   5) proteinuria Work-up  until pending   6) Electrolytes   BMP Latest Ref Rng & Units 06/12/2020 06/11/2020 06/10/2020  Glucose 70 - 99 mg/dL 96 58(L) 174(H)  BUN 6 - 20 mg/dL 29(H) 40(H) 43(H)  Creatinine 0.61 - 1.24 mg/dL 4.13(H) 4.93(H) 5.34(H)  Sodium 135 - 145 mmol/L 134(L) 140 140  Potassium 3.5 - 5.1 mmol/L 4.6 4.8 5.0  Chloride 98 - 111 mmol/L 103 102 97(L)  CO2 22 - 32 mmol/L '24 28 28  '$ Calcium 8.9 - 10.3 mg/dL 8.1(L) 8.2(L) 8.9     Sodium Hyponatremia We will follow   Potassium Normokalemic    7)Acid base    Co2 at goal     Plan:  We will start patient on Epogen Will follow Chem-7     Jonathan Mcmillan s Theador Hawthorne 06/12/2020, 1:41 PM

## 2020-06-12 NOTE — ED Notes (Signed)
Dinner tray given.  CBG 101

## 2020-06-12 NOTE — ED Notes (Signed)
Mansy, MD sent secure chat notifying him of pts BP of 169/113. Pt states pain has improved since morphine. All other vital signs WNL. Awaiting further orders. Will continue to monitor.

## 2020-06-12 NOTE — ED Notes (Signed)
Pt has c/o increased pain in right flank and RUQ abd that is not controlled by ordered pain meds. Mansy, MD notified via secure message. Awaiting further orders. Will continue to monitor.

## 2020-06-12 NOTE — ED Notes (Signed)
This RN called and spoke with dinning, they are going to send sandwich tray.

## 2020-06-12 NOTE — ED Notes (Signed)
Pt given a sandwich tray per pt request, dietary called for more patient menus.

## 2020-06-13 LAB — BASIC METABOLIC PANEL
Anion gap: 6 (ref 5–15)
BUN: 27 mg/dL — ABNORMAL HIGH (ref 6–20)
CO2: 24 mmol/L (ref 22–32)
Calcium: 8.5 mg/dL — ABNORMAL LOW (ref 8.9–10.3)
Chloride: 107 mmol/L (ref 98–111)
Creatinine, Ser: 3.92 mg/dL — ABNORMAL HIGH (ref 0.61–1.24)
GFR, Estimated: 21 mL/min — ABNORMAL LOW (ref >60)
Glucose, Bld: 118 mg/dL — ABNORMAL HIGH (ref 70–99)
Potassium: 5 mmol/L (ref 3.5–5.1)
Sodium: 137 mmol/L (ref 135–145)

## 2020-06-13 LAB — GLUCOSE, CAPILLARY: Glucose-Capillary: 111 mg/dL — ABNORMAL HIGH (ref 70–99)

## 2020-06-13 LAB — HEMOGLOBIN A1C
Hgb A1c MFr Bld: 7 % — ABNORMAL HIGH (ref 4.8–5.6)
Mean Plasma Glucose: 154.2 mg/dL

## 2020-06-13 MED ORDER — BLOOD GLUCOSE MONITOR KIT: PACK | 0 refills | Status: DC

## 2020-06-13 MED ORDER — HYDRALAZINE HCL 25 MG PO TABS
25.0000 mg | ORAL_TABLET | Freq: Two times a day (BID) | ORAL | Status: DC
  Administered 2020-06-13: 25 mg via ORAL
  Filled 2020-06-13: qty 1

## 2020-06-13 MED ORDER — HYDRALAZINE HCL 50 MG PO TABS: 50.0000 mg | ORAL_TABLET | Freq: Two times a day (BID) | ORAL | 2 refills | Status: DC

## 2020-06-13 MED ORDER — AMLODIPINE BESYLATE 10 MG PO TABS: 10.0000 mg | ORAL_TABLET | Freq: Every day | ORAL | 0 refills | Status: DC

## 2020-06-13 NOTE — TOC Initial Note (Signed)
Transition of Care [facility]) - Initial/Assessment Note    Patient Details  Name: Jonathan Mcmillan MRN: [id number] Date of Birth: Oct 08, 1995  Transition of Care [facility]) CM/SW Contact:    Beverly Sessions, RN Phone Number: 06/13/2020, 1:12 PM  Clinical Narrative:                  Patient admitted from home with AKI Met with patient to completed assessment for high risk for readmission score.    Patient tearful, states "I just want my IV out and to go home" Patient would not elaborate during my assessment  State he lives at home.  PCP charles drew.  Uses their pharmacy and confirms he has all of his needed medication at home.    Patient states that he has his on transportation.  States that he does not have his own glucometer and has been sharing with a family member.  Patient provided glucometer testing kit and supplies prior to discharge        Patient Goals and CMS Choice        Expected Discharge Plan and Services           Expected Discharge Date: 06/13/20                                    Prior Living Arrangements/Services                       Activities of Daily Living Home Assistive Devices/Equipment: None ADL Screening (condition at time of admission) Patient's cognitive ability adequate to safely complete daily activities?: Yes Is the patient deaf or have difficulty hearing?: No Does the patient have difficulty seeing, even when wearing glasses/contacts?: No Does the patient have difficulty concentrating, remembering, or making decisions?: No Patient able to express need for assistance with ADLs?: Yes Does the patient have difficulty dressing or bathing?: No Independently performs ADLs?: Yes (appropriate for developmental age) Does the patient have difficulty walking or climbing stairs?: No Weakness of Legs: None Weakness of Arms/Hands: None  Permission Sought/Granted                  Emotional Assessment               Admission diagnosis:  AKI (acute kidney injury) ([place]) [N17.9] Acute renal failure superimposed on stage 3 chronic kidney disease, unspecified acute renal failure type, unspecified whether stage 3a or 3b CKD ([place]) [N17.9, N18.30] Patient Active Problem List   Diagnosis Date Noted  . Acute renal failure superimposed on stage 3 chronic kidney disease, unspecified acute renal failure type, unspecified whether stage 3a or 3b CKD ([place]) 06/11/2020  . Nausea   . Polysubstance abuse ([place])   . AKI (acute kidney injury) ([place]) 06/10/2020  . Abdominal pain 09/09/2018  . Right arm cellulitis 01/26/2018  . Sepsis ([place]) 03/06/2017  . Malnutrition of moderate degree 05/22/2016  . DKA (diabetic ketoacidoses) 05/21/2016  . DM (diabetes mellitus), 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[place]) 44/92/0100  . Hypertension 10/05/2010  . Obesity 10/05/2010   PCP:  [facility], [place]:   [id number] [place] [place] [id number]: [phone]: [phone]    Social Determinants of Health (SDOH) Interventions    Readmission Risk Interventions Readmission Risk Prevention Plan 06/13/2020  Transportation Screening Complete  Palliative Care Screening  Not Applicable  Medication Review (RN Care Manager) Complete  Some recent data might be hidden    

## 2020-06-13 NOTE — Progress Notes (Signed)
Mobility Specialist - Progress Note   06/13/20 1200  Mobility  Activity Contraindicated/medical hold  Mobility performed by Mobility specialist  $Mobility charge 1 Mobility    Pt was sitting in recliner upon arrival utilizing room air. Pt agreed to session, however, elevated BP of 180/111 (129) limited exertional activity this date. Per discussion with pt and nurse, medication was given prior to session to manage BP. Will continue to monitor and attempt session later this date if appropriate.    Kathee Delton Mobility Specialist 06/13/20, 12:10 PM

## 2020-06-13 NOTE — Discharge Instructions (Signed)
Acute Kidney Injury, Adult  Acute kidney injury is a sudden worsening of kidney function. The kidneys are organs that have several jobs. They filter the blood to remove waste products and extra fluid. They also maintain a healthy balance of minerals and hormones in the body, which helps control blood pressure and keep bones strong. With this condition, your kidneys do not do their jobs as well as they should. This condition ranges from mild to severe. Over time, it may develop into long-lasting (chronic) kidney disease. Early detection and treatment may prevent acute kidney injury from developing into a chronic condition. What are the causes? Common causes of this condition include:  A problem with blood flow to the kidneys. This may be caused by: ? Low blood pressure (hypotension) or shock. ? Blood loss. ? Heart and blood vessel (cardiovascular) disease. ? Severe burns. ? Liver disease.  Direct damage to the kidneys. This may be caused by: ? Certain medicines. ? A kidney infection. ? Poisoning. ? Being around or in contact with toxic substances. ? A surgical wound. ? A hard, direct hit to the kidney area.  A sudden blockage of urine flow. This may be caused by: ? Cancer. ? Kidney stones. ? An enlarged prostate in males. What increases the risk? You are more likely to develop this condition if you:  Are older than age 46.  Are male.  Are hospitalized, especially if you are in critical condition.  Have certain conditions, such as: ? Chronic kidney disease. ? Diabetes. ? Coronary artery disease and heart failure. ? Pulmonary disease. ? Chronic liver disease. What are the signs or symptoms? Symptoms of this condition may not be obvious until the condition becomes severe. Symptoms of this condition can include:  Tiredness (lethargy) or difficulty staying awake.  Nausea or vomiting.  Swelling (edema) of the face, legs, ankles, or feet.  Problems with urination, such  as: ? Pain in the abdomen, or pain along the side of your stomach (flank). ? Producing little or no urine. ? Passing urine with a weak flow.  Muscle twitches and cramps, especially in the legs.  Confusion or trouble concentrating.  Loss of appetite.  Fever. How is this diagnosed? Your health care provider can diagnose this condition based on your symptoms, medical history, and a physical exam.  You may also have other tests, such as:  Blood tests.  Urine tests.  Imaging tests.  A test in which a sample of tissue is removed from the kidneys to be examined under a microscope (kidney biopsy). How is this treated? Treatment for this condition depends on the cause and how severe the condition is. In mild cases, treatment may not be needed. The kidneys may heal on their own. In more severe cases, treatment will involve:  Treating the cause of the kidney injury. This may involve changing any medicines you are taking or adjusting your dosage.  Fluids. You may need specialized IV fluids to balance your body's needs.  Having a catheter placed to drain urine and prevent blockages.  Preventing problems from occurring. This may mean avoiding certain medicines or procedures that can cause further injury to the kidneys. In some cases, treatment may also require:  A procedure to remove toxic wastes from the body (dialysis or continuous renal replacement therapy, CRRT).  Surgery. This may be done to repair a torn kidney or to remove the blockage from the urinary system. Follow these instructions at home: Medicines  Take over-the-counter and prescription medicines only  as told by your health care provider.  Do not take any new medicines without your health care provider's approval. Many medicines can worsen your kidney damage.  Do not take any vitamin and mineral supplements without your health care provider's approval. Many nutritional supplements can worsen your kidney  damage. Lifestyle  If your health care provider prescribed changes to your diet, follow them. You may need to decrease the amount of protein you eat.  Achieve and maintain a healthy weight. If you need help with this, ask your health care provider.  Start or continue an exercise plan. Try to exercise at least 30 minutes a day, 5 days a week.  Do not use any products that contain nicotine or tobacco, such as cigarettes, e-cigarettes, and chewing tobacco. If you need help quitting, ask your health care provider.   General instructions  Keep track of your blood pressure. Report changes in your blood pressure as told by your health care provider.  Stay up to date with your vaccines. Ask your health care provider which vaccines you need.  Keep all follow-up visits as told by your health care provider. This is important.   Where to find more information  American Association of Kidney Patients: BombTimer.gl  National Kidney Foundation: www.kidney.[place]: https://mathis.com/  Life Options Rehabilitation Program: ? www.lifeoptions.org ? www.kidneyschool.org Contact a health care provider if:  Your symptoms get worse.  You develop new symptoms. Get help right away if:  You develop symptoms of worsening kidney disease, which include: ? Headaches. ? Abnormally dark or light skin. ? Easy bruising. ? Frequent hiccups. ? Chest pain. ? Shortness of breath. ? End of menstruation in women. ? Seizures. ? Confusion or altered mental status. ? Abdominal or back pain. ? Itchiness.  You have a fever.  Your body is producing less urine.  You have pain or bleeding when you urinate. Summary  Acute kidney injury is a sudden worsening of kidney function.  Acute kidney injury can be caused by problems with blood flow to the kidneys, direct damage to the kidneys, and sudden blockage of urine flow.  Symptoms of this condition may not be obvious until it becomes severe.  Symptoms may include edema, lethargy, confusion, nausea or vomiting, and problems passing urine.  This condition can be diagnosed with blood tests, urine tests, and imaging tests. Sometimes a kidney biopsy is done to diagnose this condition.  Treatment for this condition often involves treating the underlying cause. It is treated with fluids, medicines, diet changes, dialysis, or surgery. This information is not intended to replace advice given to you by your health care provider. Make sure you discuss any questions you have with your health care provider. Document Revised: 03/10/2019 Document Reviewed: 03/10/2019 Elsevier Patient Education  2021 [place]. Patient is advised not to take any form of insulin. He is asked to keep a log of his sugars at home and review with Juanda Crumble drew PCP. He is recommended take his blood pressure medication keep log of blood pressure at home.

## 2020-06-13 NOTE — Progress Notes (Signed)
AVS given to patient, IV removed. Walked patient out to medical mall entrance.

## 2020-06-13 NOTE — Plan of Care (Signed)
  Problem: Education: Goal: Knowledge of disease and its progression will improve Outcome: Adequate for Discharge   Problem: Health Behavior/Discharge Planning: Goal: Ability to manage health-related needs will improve Outcome: Adequate for Discharge   Problem: Clinical Measurements: Goal: Complications related to the disease process or treatment will be avoided or minimized Outcome: Adequate for Discharge Goal: Dialysis access will remain free of complications Outcome: Adequate for Discharge   Problem: Activity: Goal: Activity intolerance will improve Outcome: Adequate for Discharge   Problem: Fluid Volume: Goal: Fluid volume balance will be maintained or improved Outcome: Adequate for Discharge   Problem: Nutritional: Goal: Ability to make appropriate dietary choices will improve Outcome: Adequate for Discharge   Problem: Respiratory: Goal: Respiratory symptoms related to disease process will be avoided Outcome: Adequate for Discharge   Problem: Self-Concept: Goal: Body image disturbance will be avoided or minimized Outcome: Adequate for Discharge   Problem: Urinary Elimination: Goal: Progression of disease will be identified and treated Outcome: Adequate for Discharge

## 2020-06-13 NOTE — Discharge Summary (Signed)
[facility] NAME: Jonathan Mcmillan    MR#:  [id number]  DATE OF BIRTH:  1995/12/10  DATE OF ADMISSION:  06/10/2020 ADMITTING PHYSICIAN: Fritzi Mandes, MD  DATE OF DISCHARGE: 06/13/2020  PRIMARY CARE PHYSICIAN: [facility]    ADMISSION DIAGNOSIS:  AKI (acute kidney injury) ([place]) [N17.9] Acute renal failure superimposed on stage 3 chronic kidney disease, unspecified acute renal failure type, unspecified whether stage 3a or 3b CKD ([place]) [N17.9, N18.30]  DISCHARGE DIAGNOSIS:  Acute on CKD-IIIB with dehydration and poor po intake HTN DM-1 with diabetic nephropathy  SECONDARY DIAGNOSIS:   Past Medical History:  Diagnosis Date  . Cannabinoid hyperemesis syndrome   . Diabetes 1.5, managed as type 1 ([place])   . Gastroparesis   . Perirectal abscess 06/16/2017    HOSPITAL COURSE:   BrandonKellyis a24 y.o.African-American male, coming from home,with a known history of diabetes mellitus, gastroparesis, GERD and hypertension who presented to the emergency room with abnormal lab work with elevated renal functions that were found on routine physical lab that was drawn yesterday by his PCP. He has not been feeling well recently with mild generalized weakness over the last 2 to 3 days. He was diagnosed with Covid-19 on 05/22/2020.   Acute kidney injury superimposed on stage IIIb chronic kidney disease, history of juvenile diabetes, history of diabetic foot - IV normal saline. -came in with creat 5.53--4.93--4.1--3.92 - renal ultrasound no acute abnormality. -Avoid Nephrotoxins  -Nephrology consultation Dr. Theador Hawthorne -- appreciate input --d/w dr singh--pt ok to d/c and f/u out pt. 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 Hypoglycemia due to poor po intake --No current signs of gastroparesis flare. --IV dextrose 10% at  50 cc/hr--  DCed now. Sugar stable --Holding  insulin at discharge. Pt advised to cont check sugars and review with PCP --A1c is 7.0%  Essential hypertension. -Zestril--on hold -- blood pressure elevated started on amlodipine and  Hydralazine 50 mg bid --bp a bit up due to pt being upset--found out uncle died!  GERD/gastroparesis/abdominal pain -PPI therapy -- Reglan as needed -- KUB within normal limit  Ongoing tobacco and marijuana abuse. -counseled him for cessation   DVT prophylaxis. -Subcutaneous heparin.  Recent COVID 19 infection --positive on 05/23/2019 (per Epic) -- relatively asymptomatic. Sats stable  CODE STATUS: Full code  Status is: Inpatient  Dispo: The patient is from:Home Anticipated d/c is CX:KGYJ Anticipated d/c date EH:UDJSH Patient currently is  Medically improving stable to d/c. Difficult to place patient No  Level of care: Med-Surg   CONSULTS OBTAINED:  Treatment Team:  Liana Gerold, MD  DRUG ALLERGIES:  No Known Allergies  DISCHARGE MEDICATIONS:   Allergies as of 06/13/2020   No Known Allergies     Medication List    STOP taking these medications   insulin aspart protamine- aspart (70-30) 100 UNIT/ML injection Commonly known as: NOVOLOG MIX 70/30   insulin glargine 100 UNIT/ML injection Commonly known as: LANTUS   lisinopril 40 MG tablet Commonly known as: ZESTRIL   promethazine 25 MG tablet Commonly known as: PHENERGAN     TAKE these medications   amLODipine 10 MG tablet Commonly known as: NORVASC Take 1 tablet (10 mg total) by mouth daily. Start taking on: June 14, 2020   blood glucose meter kit and supplies Kit Dispense based on patient and insurance preference. Use up to four times  daily as directed. (FOR ICD-9 250.00, 250.01). What changed: Another medication with the same name was added. Make sure you understand how and when to take each.    blood glucose meter kit and supplies Kit Dispense based on patient and insurance preference. Use up to four times daily as directed. (FOR ICD-9 250.00, 250.01). What changed: You were already taking a medication with the same name, and this prescription was added. Make sure you understand how and when to take each.   hydrALAZINE 50 MG tablet Commonly known as: APRESOLINE Take 1 tablet (50 mg total) by mouth 2 (two) times daily.   omeprazole 40 MG capsule Commonly known as: PRILOSEC Take 1 capsule (40 mg total) by mouth daily.   oxyCODONE-acetaminophen 5-325 MG tablet Commonly known as: Percocet Take 1 tablet by mouth every 4 (four) hours as needed for severe pain.       If you experience worsening of your admission symptoms, develop shortness of breath, life threatening emergency, suicidal or homicidal thoughts you must seek medical attention immediately by calling 911 or calling your MD immediately  if symptoms less severe.  You Must read complete instructions/literature along with all the possible adverse reactions/side effects for all the Medicines you take and that have been prescribed to you. Take any new Medicines after you have completely understood and accept all the possible adverse reactions/side effects.   Please note  You were cared for by a hospitalist during your hospital stay. If you have any questions about your discharge medications or the care you received while you were in the hospital after you are discharged, you can call the unit and asked to speak with the hospitalist on call if the hospitalist that took care of you is not available. Once you are discharged, your primary care physician will handle any further medical issues. Please note that NO REFILLS for any discharge medications will be authorized once you are discharged, as it is imperative that you return to your primary care physician (or establish a relationship with a primary care physician if you do not have  one) for your aftercare needs so that they can reassess your need for medications and monitor your lab values. Today   SUBJECTIVE   A bit upset--uncle died Eating a bit better Wants to go home  VITAL SIGNS:  Blood pressure (!) 170/90, pulse 89, temperature 97.8 F (36.6 C), temperature source Oral, resp. rate 18, height 5' 9" (1.753 m), weight 59 kg, SpO2 100 %.  I/O:    Intake/Output Summary (Last 24 hours) at 06/13/2020 1251 Last data filed at 06/13/2020 9735 Gross per 24 hour  Intake 3000 ml  Output 920 ml  Net 2080 ml    PHYSICAL EXAMINATION:  GENERAL:  25 y.o.-year-old patient lying in the bed with no acute distress.  LUNGS: Normal breath sounds bilaterally, no wheezing, rales,rhonchi or crepitation. No use of accessory muscles of respiration.  CARDIOVASCULAR: S1, S2 normal. No murmurs, rubs, or gallops.  ABDOMEN: Soft, non-tender, non-distended. Bowel sounds present. No organomegaly or mass.  EXTREMITIES: No pedal edema, cyanosis, or clubbing.  NEUROLOGIC: Muscle strength 5/5 in all extremities. Sensation intact. Gait not checked.  PSYCHIATRIC: patient is alert and oriented x 3.  SKIN: No obvious rash, lesion, or ulcer.   DATA REVIEW:   CBC  Recent Labs  Lab 06/11/20 0341  WBC 4.2  HGB 8.3*  HCT 24.9*  PLT 187    Chemistries  Recent Labs  Lab 06/10/20 1705 06/11/20 0341 06/13/20 0540  NA 140   < > 137  K 5.0   < > 5.0  CL 97*   < > 107  CO2 28   < > 24  GLUCOSE 174*   < > 118*  BUN 43*   < > 27*  CREATININE 5.34*   < > 3.92*  CALCIUM 8.9   < > 8.5*  AST 13*  --   --   ALT 17  --   --   ALKPHOS 94  --   --   BILITOT 1.2  --   --    < > = values in this interval not displayed.    Microbiology Results   No results found for this or any previous visit (from the past 240 hour(s)).  RADIOLOGY:  DG Abdomen 1 View  Result Date: 06/11/2020 CLINICAL DATA:  Abdominal pain EXAM: ABDOMEN - 1 VIEW FINDINGS: No dilated large or small bowel. Gas and stool  in the rectum. No organomegaly. No pathologic calcifications. No acute osseous abnormality. IMPRESSION: No acute abdominal findings plain film. Electronically Signed   By: Suzy Bouchard M.D.   On: 06/11/2020 16:31     CODE STATUS:     Code Status Orders  (From admission, onward)         Start     Ordered   06/10/20 1952  Full code  Continuous        06/10/20 1955        Code Status History    Date Active Date Inactive Code Status Order ID Comments User Context   09/09/2018 1215 09/10/2018 1711 Full Code [id number]  Dustin Flock, MD Inpatient   01/26/2018 1840 01/27/2018 1734 Full Code [id number]  Sela Hua, MD Inpatient   06/16/2017 1141 06/18/2017 1433 Full Code [id number]  Florene Glen, MD ED   03/06/2017 1004 03/08/2017 1913 Full Code [id number]  Harrie Foreman, MD Inpatient   05/21/2016 0804 05/24/2016 1751 Full Code [id number]  Saundra Shelling, MD ED   Advance Care Planning Activity       TOTAL TIME TAKING CARE OF THIS PATIENT: *35* minutes.    Fritzi Mandes M.D  [facility]    CC: Primary care physician; [facility] [place]

## 2020-06-13 NOTE — Progress Notes (Signed)
Mobility Specialist - Progress Note   06/13/20 1226  Mobility  Activity Ambulated in hall  Level of Assistance Independent  Assistive Device None  Distance Ambulated (ft) 360 ft  Mobility Response Tolerated well  Mobility performed by Mobility specialist  $Mobility charge 1 Mobility    Pt seen ambulating 2 laps around nursing station shortly after session. Pt denied weakness, fatigue, or pain with activity. Pt denied SOB and dizziness. Pt stated that he feels he is at his baseline of functioning, as he's anticipating d/c. Overall, pt appears to be tolerating well. Pt back in room, with all needs in reach.    Kathee Delton Mobility Specialist 06/13/20, 12:29 PM

## 2020-06-13 NOTE — Progress Notes (Signed)
Patient in room-- became audible irritated with someone in the room. Patient reports that he just received some bad news regarding the passing of his uncle. Patient states he needs to get home. MD aware. Patient educated on high blood pressure- states he understands and will take appropriate meds at home. Will continue to monitor.

## 2020-06-14 LAB — PROTEIN ELECTROPHORESIS, SERUM
A/G Ratio: 3.2
Albumin ELP: 4.1 g/dL
Alpha-1-Globulin: 0.2 g/dL
Alpha-2-Globulin: 0.9 g/dL
Beta Globulin: 1 g/dL
Gamma Globulin: 1.2 g/dL
Globulin, Total: 1.3 g/dL
Total Protein ELP: 5.4 g/dL — ABNORMAL LOW (ref 6.0–8.5)

## 2020-06-14 LAB — IMMUNOFIXATION ELECTROPHORESIS
IgA: 148 mg/dL (ref 90–386)
IgG (Immunoglobin G), Serum: 863 mg/dL (ref 603–1613)
IgM (Immunoglobulin M), Srm: 36 mg/dL (ref 20–172)
Total Protein ELP: 5.5 g/dL — ABNORMAL LOW (ref 6.0–8.5)

## 2020-06-14 LAB — MPO/PR-3 (ANCA) ANTIBODIES
ANCA Proteinase 3: <3.5 U/mL (ref 0.0–3.5)
Myeloperoxidase Abs: <9 U/mL (ref 0.0–9.0)

## 2020-06-14 LAB — ANTINUCLEAR ANTIBODIES, IFA: ANA Ab, IFA: NEGATIVE

## 2020-06-14 LAB — ANTI-DNA ANTIBODY, DOUBLE-STRANDED: ds DNA Ab: <1 IU/mL (ref 0–9)

## 2020-06-15 LAB — PTH, INTACT AND CALCIUM
Calcium, Total (PTH): 8 mg/dL
PTH: 110 pg/mL — ABNORMAL HIGH (ref 15–65)

## 2020-06-15 LAB — GLOMERULAR BASEMENT MEMBRANE ANTIBODIES: GBM Ab: 4 units (ref 0–20)

## 2020-06-16 LAB — C3 COMPLEMENT: C3 Complement: 104 mg/dL (ref 82–167)

## 2020-06-16 LAB — C4 COMPLEMENT: Complement C4, Body Fluid: 18 mg/dL (ref 12–38)

## 2020-06-17 LAB — COMPLEMENT, TOTAL

## 2020-06-20 LAB — ANCA TITERS
Atypical P-ANCA titer: <1:20 {titer}
C-ANCA: <1:20 {titer}
P-ANCA: <1:20 {titer}

## 2020-08-25 HISTORY — PX: AMPUTATION TOE: SHX6595

## 2021-03-10 ENCOUNTER — Other Ambulatory Visit: Payer: Self-pay

## 2021-03-10 ENCOUNTER — Emergency Department
Admission: EM | Admit: 2021-03-10 | Discharge: 2021-03-10 | Disposition: A | Discharge status: Home / Self Care | Payer: Self-pay | Attending: Emergency Medicine | Admitting: Emergency Medicine

## 2021-03-10 DIAGNOSIS — Y92410 Unspecified street and highway as the place of occurrence of the external cause: Secondary | ICD-10-CM | POA: Insufficient documentation

## 2021-03-10 DIAGNOSIS — F17210 Nicotine dependence, cigarettes, uncomplicated: Secondary | ICD-10-CM | POA: Insufficient documentation

## 2021-03-10 DIAGNOSIS — F419 Anxiety disorder, unspecified: Secondary | ICD-10-CM | POA: Insufficient documentation

## 2021-03-10 DIAGNOSIS — E1010 Type 1 diabetes mellitus with ketoacidosis without coma: Secondary | ICD-10-CM | POA: Insufficient documentation

## 2021-03-10 DIAGNOSIS — R519 Headache, unspecified: Secondary | ICD-10-CM | POA: Insufficient documentation

## 2021-03-10 DIAGNOSIS — T148XXA Other injury of unspecified body region, initial encounter: Secondary | ICD-10-CM

## 2021-03-10 DIAGNOSIS — N183 Chronic kidney disease, stage 3 unspecified: Secondary | ICD-10-CM | POA: Insufficient documentation

## 2021-03-10 DIAGNOSIS — Z79899 Other long term (current) drug therapy: Secondary | ICD-10-CM | POA: Insufficient documentation

## 2021-03-10 DIAGNOSIS — I129 Hypertensive chronic kidney disease with stage 1 through stage 4 chronic kidney disease, or unspecified chronic kidney disease: Secondary | ICD-10-CM | POA: Insufficient documentation
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
## 2021-03-10 NOTE — Discharge Instructions (Signed)
Take Tylenol or ibuprofen for pain as needed. Follow-up with your regular doctor if not improving to 3 days

## 2021-05-22 DIAGNOSIS — Z8616 Personal history of COVID-19: Secondary | ICD-10-CM

## 2021-05-22 HISTORY — DX: Personal history of COVID-19: Z86.16

## 2021-05-31 ENCOUNTER — Inpatient Hospital Stay: Payer: Medicaid Other

## 2021-05-31 ENCOUNTER — Inpatient Hospital Stay
Admission: EM | Admit: 2021-05-31 | Discharge: 2021-06-22 | DRG: 870 | Disposition: A | Discharge status: Rehabilitation Facility | Payer: Medicaid Other | Attending: Internal Medicine | Admitting: Internal Medicine

## 2021-05-31 ENCOUNTER — Other Ambulatory Visit: Payer: Self-pay

## 2021-05-31 ENCOUNTER — Encounter: Payer: Self-pay | Admitting: Emergency Medicine

## 2021-05-31 ENCOUNTER — Emergency Department: Payer: Medicaid Other

## 2021-05-31 DIAGNOSIS — A419 Sepsis, unspecified organism: Secondary | ICD-10-CM | POA: Insufficient documentation

## 2021-05-31 DIAGNOSIS — D509 Iron deficiency anemia, unspecified: Secondary | ICD-10-CM | POA: Diagnosis present

## 2021-05-31 DIAGNOSIS — N170 Acute kidney failure with tubular necrosis: Secondary | ICD-10-CM | POA: Diagnosis not present

## 2021-05-31 DIAGNOSIS — I480 Paroxysmal atrial fibrillation: Secondary | ICD-10-CM

## 2021-05-31 DIAGNOSIS — R627 Adult failure to thrive: Secondary | ICD-10-CM | POA: Diagnosis not present

## 2021-05-31 DIAGNOSIS — Z7189 Other specified counseling: Secondary | ICD-10-CM | POA: Diagnosis not present

## 2021-05-31 DIAGNOSIS — D631 Anemia in chronic kidney disease: Secondary | ICD-10-CM | POA: Diagnosis present

## 2021-05-31 DIAGNOSIS — N189 Chronic kidney disease, unspecified: Secondary | ICD-10-CM | POA: Diagnosis not present

## 2021-05-31 DIAGNOSIS — N186 End stage renal disease: Secondary | ICD-10-CM

## 2021-05-31 DIAGNOSIS — L89302 Pressure ulcer of unspecified buttock, stage 2: Secondary | ICD-10-CM | POA: Diagnosis not present

## 2021-05-31 DIAGNOSIS — E11649 Type 2 diabetes mellitus with hypoglycemia without coma: Secondary | ICD-10-CM | POA: Diagnosis not present

## 2021-05-31 DIAGNOSIS — D649 Anemia, unspecified: Secondary | ICD-10-CM

## 2021-05-31 DIAGNOSIS — E872 Acidosis, unspecified: Secondary | ICD-10-CM | POA: Diagnosis present

## 2021-05-31 DIAGNOSIS — R404 Transient alteration of awareness: Secondary | ICD-10-CM | POA: Diagnosis not present

## 2021-05-31 DIAGNOSIS — F32A Depression, unspecified: Secondary | ICD-10-CM | POA: Diagnosis present

## 2021-05-31 DIAGNOSIS — R570 Cardiogenic shock: Secondary | ICD-10-CM | POA: Diagnosis not present

## 2021-05-31 DIAGNOSIS — I468 Cardiac arrest due to other underlying condition: Secondary | ICD-10-CM | POA: Diagnosis not present

## 2021-05-31 DIAGNOSIS — N185 Chronic kidney disease, stage 5: Secondary | ICD-10-CM | POA: Diagnosis not present

## 2021-05-31 DIAGNOSIS — Z841 Family history of disorders of kidney and ureter: Secondary | ICD-10-CM

## 2021-05-31 DIAGNOSIS — J155 Pneumonia due to Escherichia coli: Secondary | ICD-10-CM | POA: Diagnosis present

## 2021-05-31 DIAGNOSIS — Z978 Presence of other specified devices: Secondary | ICD-10-CM

## 2021-05-31 DIAGNOSIS — K8681 Exocrine pancreatic insufficiency: Secondary | ICD-10-CM | POA: Diagnosis not present

## 2021-05-31 DIAGNOSIS — E43 Unspecified severe protein-calorie malnutrition: Secondary | ICD-10-CM | POA: Diagnosis not present

## 2021-05-31 DIAGNOSIS — Z4659 Encounter for fitting and adjustment of other gastrointestinal appliance and device: Secondary | ICD-10-CM

## 2021-05-31 DIAGNOSIS — A4151 Sepsis due to Escherichia coli [E. coli]: Principal | ICD-10-CM | POA: Diagnosis present

## 2021-05-31 DIAGNOSIS — R197 Diarrhea, unspecified: Secondary | ICD-10-CM | POA: Diagnosis present

## 2021-05-31 DIAGNOSIS — G931 Anoxic brain damage, not elsewhere classified: Secondary | ICD-10-CM | POA: Diagnosis not present

## 2021-05-31 DIAGNOSIS — I248 Other forms of acute ischemic heart disease: Secondary | ICD-10-CM | POA: Diagnosis present

## 2021-05-31 DIAGNOSIS — M791 Myalgia, unspecified site: Secondary | ICD-10-CM | POA: Diagnosis present

## 2021-05-31 DIAGNOSIS — E875 Hyperkalemia: Secondary | ICD-10-CM | POA: Diagnosis present

## 2021-05-31 DIAGNOSIS — I5020 Unspecified systolic (congestive) heart failure: Secondary | ICD-10-CM | POA: Diagnosis not present

## 2021-05-31 DIAGNOSIS — E1043 Type 1 diabetes mellitus with diabetic autonomic (poly)neuropathy: Secondary | ICD-10-CM | POA: Diagnosis present

## 2021-05-31 DIAGNOSIS — R809 Proteinuria, unspecified: Secondary | ICD-10-CM | POA: Diagnosis present

## 2021-05-31 DIAGNOSIS — Z01818 Encounter for other preprocedural examination: Secondary | ICD-10-CM

## 2021-05-31 DIAGNOSIS — T68XXXA Hypothermia, initial encounter: Secondary | ICD-10-CM | POA: Diagnosis not present

## 2021-05-31 DIAGNOSIS — N184 Chronic kidney disease, stage 4 (severe): Secondary | ICD-10-CM | POA: Diagnosis present

## 2021-05-31 DIAGNOSIS — Z992 Dependence on renal dialysis: Secondary | ICD-10-CM

## 2021-05-31 DIAGNOSIS — I5043 Acute on chronic combined systolic (congestive) and diastolic (congestive) heart failure: Secondary | ICD-10-CM | POA: Diagnosis present

## 2021-05-31 DIAGNOSIS — I469 Cardiac arrest, cause unspecified: Secondary | ICD-10-CM

## 2021-05-31 DIAGNOSIS — R778 Other specified abnormalities of plasma proteins: Secondary | ICD-10-CM

## 2021-05-31 DIAGNOSIS — I82409 Acute embolism and thrombosis of unspecified deep veins of unspecified lower extremity: Secondary | ICD-10-CM

## 2021-05-31 DIAGNOSIS — N179 Acute kidney failure, unspecified: Secondary | ICD-10-CM | POA: Diagnosis present

## 2021-05-31 DIAGNOSIS — I5042 Chronic combined systolic (congestive) and diastolic (congestive) heart failure: Secondary | ICD-10-CM

## 2021-05-31 DIAGNOSIS — K219 Gastro-esophageal reflux disease without esophagitis: Secondary | ICD-10-CM | POA: Diagnosis present

## 2021-05-31 DIAGNOSIS — J690 Pneumonitis due to inhalation of food and vomit: Secondary | ICD-10-CM

## 2021-05-31 DIAGNOSIS — K3184 Gastroparesis: Secondary | ICD-10-CM | POA: Diagnosis present

## 2021-05-31 DIAGNOSIS — Z1611 Resistance to penicillins: Secondary | ICD-10-CM | POA: Diagnosis present

## 2021-05-31 DIAGNOSIS — D696 Thrombocytopenia, unspecified: Secondary | ICD-10-CM | POA: Diagnosis present

## 2021-05-31 DIAGNOSIS — R319 Hematuria, unspecified: Secondary | ICD-10-CM | POA: Diagnosis present

## 2021-05-31 DIAGNOSIS — T829XXA Unspecified complication of cardiac and vascular prosthetic device, implant and graft, initial encounter: Secondary | ICD-10-CM

## 2021-05-31 DIAGNOSIS — L89620 Pressure ulcer of left heel, unstageable: Secondary | ICD-10-CM | POA: Diagnosis present

## 2021-05-31 DIAGNOSIS — N2581 Secondary hyperparathyroidism of renal origin: Secondary | ICD-10-CM | POA: Diagnosis present

## 2021-05-31 DIAGNOSIS — Z20822 Contact with and (suspected) exposure to covid-19: Secondary | ICD-10-CM | POA: Diagnosis present

## 2021-05-31 DIAGNOSIS — F17200 Nicotine dependence, unspecified, uncomplicated: Secondary | ICD-10-CM | POA: Insufficient documentation

## 2021-05-31 DIAGNOSIS — E876 Hypokalemia: Secondary | ICD-10-CM | POA: Diagnosis not present

## 2021-05-31 DIAGNOSIS — Z79899 Other long term (current) drug therapy: Secondary | ICD-10-CM

## 2021-05-31 DIAGNOSIS — R509 Fever, unspecified: Secondary | ICD-10-CM | POA: Diagnosis not present

## 2021-05-31 DIAGNOSIS — E108 Type 1 diabetes mellitus with unspecified complications: Secondary | ICD-10-CM | POA: Insufficient documentation

## 2021-05-31 DIAGNOSIS — E1040 Type 1 diabetes mellitus with diabetic neuropathy, unspecified: Secondary | ICD-10-CM | POA: Diagnosis present

## 2021-05-31 DIAGNOSIS — F1210 Cannabis abuse, uncomplicated: Secondary | ICD-10-CM | POA: Diagnosis present

## 2021-05-31 DIAGNOSIS — J9601 Acute respiratory failure with hypoxia: Secondary | ICD-10-CM

## 2021-05-31 DIAGNOSIS — R6521 Severe sepsis with septic shock: Secondary | ICD-10-CM | POA: Diagnosis present

## 2021-05-31 DIAGNOSIS — L8990 Pressure ulcer of unspecified site, unspecified stage: Secondary | ICD-10-CM | POA: Insufficient documentation

## 2021-05-31 DIAGNOSIS — R1310 Dysphagia, unspecified: Secondary | ICD-10-CM | POA: Diagnosis present

## 2021-05-31 DIAGNOSIS — Z560 Unemployment, unspecified: Secondary | ICD-10-CM

## 2021-05-31 DIAGNOSIS — G928 Other toxic encephalopathy: Secondary | ICD-10-CM | POA: Diagnosis not present

## 2021-05-31 DIAGNOSIS — D638 Anemia in other chronic diseases classified elsewhere: Secondary | ICD-10-CM | POA: Diagnosis not present

## 2021-05-31 DIAGNOSIS — R051 Acute cough: Secondary | ICD-10-CM

## 2021-05-31 DIAGNOSIS — R6510 Systemic inflammatory response syndrome (SIRS) of non-infectious origin without acute organ dysfunction: Secondary | ICD-10-CM

## 2021-05-31 DIAGNOSIS — I2510 Atherosclerotic heart disease of native coronary artery without angina pectoris: Secondary | ICD-10-CM | POA: Diagnosis present

## 2021-05-31 DIAGNOSIS — R571 Hypovolemic shock: Secondary | ICD-10-CM | POA: Diagnosis present

## 2021-05-31 DIAGNOSIS — Z9911 Dependence on respirator [ventilator] status: Secondary | ICD-10-CM

## 2021-05-31 DIAGNOSIS — J9602 Acute respiratory failure with hypercapnia: Secondary | ICD-10-CM | POA: Diagnosis not present

## 2021-05-31 DIAGNOSIS — Z452 Encounter for adjustment and management of vascular access device: Secondary | ICD-10-CM

## 2021-05-31 DIAGNOSIS — I959 Hypotension, unspecified: Secondary | ICD-10-CM

## 2021-05-31 DIAGNOSIS — E10649 Type 1 diabetes mellitus with hypoglycemia without coma: Secondary | ICD-10-CM | POA: Diagnosis not present

## 2021-05-31 DIAGNOSIS — E871 Hypo-osmolality and hyponatremia: Secondary | ICD-10-CM | POA: Diagnosis present

## 2021-05-31 DIAGNOSIS — E1122 Type 2 diabetes mellitus with diabetic chronic kidney disease: Secondary | ICD-10-CM | POA: Diagnosis present

## 2021-05-31 DIAGNOSIS — Z6823 Body mass index (BMI) 23.0-23.9, adult: Secondary | ICD-10-CM

## 2021-05-31 DIAGNOSIS — B349 Viral infection, unspecified: Secondary | ICD-10-CM | POA: Diagnosis present

## 2021-05-31 DIAGNOSIS — I132 Hypertensive heart and chronic kidney disease with heart failure and with stage 5 chronic kidney disease, or end stage renal disease: Secondary | ICD-10-CM | POA: Diagnosis present

## 2021-05-31 DIAGNOSIS — E1022 Type 1 diabetes mellitus with diabetic chronic kidney disease: Secondary | ICD-10-CM | POA: Diagnosis present

## 2021-05-31 DIAGNOSIS — Z515 Encounter for palliative care: Secondary | ICD-10-CM | POA: Diagnosis not present

## 2021-05-31 DIAGNOSIS — Z794 Long term (current) use of insulin: Secondary | ICD-10-CM

## 2021-05-31 DIAGNOSIS — R34 Anuria and oliguria: Secondary | ICD-10-CM | POA: Diagnosis not present

## 2021-05-31 DIAGNOSIS — Z833 Family history of diabetes mellitus: Secondary | ICD-10-CM

## 2021-05-31 DIAGNOSIS — F17210 Nicotine dependence, cigarettes, uncomplicated: Secondary | ICD-10-CM | POA: Diagnosis present

## 2021-05-31 DIAGNOSIS — Z0189 Encounter for other specified special examinations: Secondary | ICD-10-CM

## 2021-05-31 DIAGNOSIS — R1110 Vomiting, unspecified: Secondary | ICD-10-CM | POA: Diagnosis present

## 2021-05-31 DIAGNOSIS — I10 Essential (primary) hypertension: Secondary | ICD-10-CM

## 2021-05-31 HISTORY — DX: Nicotine dependence, unspecified, uncomplicated: F17.200

## 2021-05-31 HISTORY — DX: Chronic kidney disease, stage 4 (severe): N18.4

## 2021-05-31 HISTORY — DX: Essential (primary) hypertension: I10

## 2021-05-31 LAB — C DIFFICILE QUICK SCREEN W PCR REFLEX
C Diff antigen: NEGATIVE
C Diff interpretation: NOT DETECTED
C Diff toxin: NEGATIVE

## 2021-05-31 LAB — HEMOGLOBIN A1C
Hgb A1c MFr Bld: 5.7 % — ABNORMAL HIGH (ref 4.8–5.6)
Mean Plasma Glucose: 116.89 mg/dL

## 2021-05-31 LAB — LIPASE, BLOOD: Lipase: 29 U/L (ref 11–51)

## 2021-05-31 LAB — RESPIRATORY PANEL BY PCR

## 2021-05-31 LAB — GASTROINTESTINAL PANEL BY PCR, STOOL (REPLACES STOOL CULTURE)

## 2021-05-31 LAB — COMPREHENSIVE METABOLIC PANEL
ALT: 33 U/L (ref 0–44)
AST: 24 U/L (ref 15–41)
Albumin: 2.9 g/dL — ABNORMAL LOW (ref 3.5–5.0)
Alkaline Phosphatase: 70 U/L (ref 38–126)
Anion gap: 14 (ref 5–15)
BUN: 90 mg/dL — ABNORMAL HIGH (ref 6–20)
CO2: 15 mmol/L — ABNORMAL LOW (ref 22–32)
Calcium: 6.7 mg/dL — ABNORMAL LOW (ref 8.9–10.3)
Chloride: 102 mmol/L (ref 98–111)
Creatinine, Ser: 13.86 mg/dL — ABNORMAL HIGH (ref 0.61–1.24)
GFR, Estimated: 5 mL/min — ABNORMAL LOW (ref >60)
Glucose, Bld: 177 mg/dL — ABNORMAL HIGH (ref 70–99)
Potassium: 4.4 mmol/L (ref 3.5–5.1)
Sodium: 131 mmol/L — ABNORMAL LOW (ref 135–145)
Total Bilirubin: 0.6 mg/dL (ref 0.3–1.2)
Total Protein: 6.3 g/dL — ABNORMAL LOW (ref 6.5–8.1)

## 2021-05-31 LAB — PREPARE RBC (CROSSMATCH)

## 2021-05-31 LAB — CBC WITH DIFFERENTIAL/PLATELET
Abs Immature Granulocytes: 0.12 10*3/uL — ABNORMAL HIGH (ref 0.00–0.07)
Basophils Absolute: 0 10*3/uL (ref 0.0–0.1)
Basophils Relative: 0 %
Eosinophils Absolute: 0.1 10*3/uL (ref 0.0–0.5)
Eosinophils Relative: 1 %
HCT: 15.8 % — ABNORMAL LOW (ref 39.0–52.0)
Hemoglobin: 5.2 g/dL — ABNORMAL LOW (ref 13.0–17.0)
Immature Granulocytes: 1 %
Lymphocytes Relative: 2 %
Lymphs Abs: 0.3 10*3/uL — ABNORMAL LOW (ref 0.7–4.0)
MCH: 29.5 pg (ref 26.0–34.0)
MCHC: 32.9 g/dL (ref 30.0–36.0)
MCV: 89.8 fL (ref 80.0–100.0)
Monocytes Absolute: 0.6 10*3/uL (ref 0.1–1.0)
Monocytes Relative: 5 %
Neutro Abs: 11.6 10*3/uL — ABNORMAL HIGH (ref 1.7–7.7)
Neutrophils Relative %: 91 %
Platelets: 172 10*3/uL (ref 150–400)
RBC: 1.76 MIL/uL — ABNORMAL LOW (ref 4.22–5.81)
RDW: 12.8 % (ref 11.5–15.5)
WBC: 12.7 10*3/uL — ABNORMAL HIGH (ref 4.0–10.5)
nRBC: 0 % (ref 0.0–0.2)

## 2021-05-31 LAB — RESP PANEL BY RT-PCR (FLU A&B, COVID) ARPGX2
Influenza A by PCR: NEGATIVE
Influenza B by PCR: NEGATIVE
SARS Coronavirus 2 by RT PCR: NEGATIVE

## 2021-05-31 LAB — URINALYSIS, COMPLETE (UACMP) WITH MICROSCOPIC
Bacteria, UA: NONE SEEN
Bilirubin Urine: NEGATIVE
Glucose, UA: 50 mg/dL — AB
Ketones, ur: NEGATIVE mg/dL
Leukocytes,Ua: NEGATIVE
Nitrite: NEGATIVE
Protein, ur: >=300 mg/dL — AB
Specific Gravity, Urine: 1.012 (ref 1.005–1.030)
Squamous Epithelial / HPF: NONE SEEN (ref 0–5)
pH: 6 (ref 5.0–8.0)

## 2021-05-31 LAB — PROTIME-INR
INR: 1.3 — ABNORMAL HIGH (ref 0.8–1.2)
Prothrombin Time: 15.8 seconds — ABNORMAL HIGH (ref 11.4–15.2)

## 2021-05-31 LAB — IRON AND TIBC
Iron: 9 ug/dL — ABNORMAL LOW (ref 45–182)
Saturation Ratios: 5 % — ABNORMAL LOW (ref 17.9–39.5)
TIBC: 195 ug/dL — ABNORMAL LOW (ref 250–450)
UIBC: 186 ug/dL

## 2021-05-31 LAB — LACTIC ACID, PLASMA
Lactic Acid, Venous: 2.1 mmol/L (ref 0.5–1.9)
Lactic Acid, Venous: 1.2 mmol/L (ref 0.5–1.9)

## 2021-05-31 LAB — ABO/RH: ABO/RH(D): B POS

## 2021-05-31 LAB — URINE DRUG SCREEN, QUALITATIVE (ARMC ONLY)
Amphetamines, Ur Screen: NOT DETECTED
Barbiturates, Ur Screen: NOT DETECTED
Benzodiazepine, Ur Scrn: NOT DETECTED
Cannabinoid 50 Ng, Ur ~~LOC~~: POSITIVE — AB
Cocaine Metabolite,Ur ~~LOC~~: NOT DETECTED
MDMA (Ecstasy)Ur Screen: NOT DETECTED
Methadone Scn, Ur: NOT DETECTED
Opiate, Ur Screen: NOT DETECTED
Phencyclidine (PCP) Ur S: NOT DETECTED
Tricyclic, Ur Screen: NOT DETECTED

## 2021-05-31 LAB — PROTEIN / CREATININE RATIO, URINE
Creatinine, Urine: 105 mg/dL
Protein Creatinine Ratio: 4.51 mg/mg{Cre} — ABNORMAL HIGH (ref 0.00–0.15)
Total Protein, Urine: 474 mg/dL

## 2021-05-31 LAB — GLUCOSE, CAPILLARY
Glucose-Capillary: 127 mg/dL — ABNORMAL HIGH (ref 70–99)
Glucose-Capillary: 114 mg/dL — ABNORMAL HIGH (ref 70–99)
Glucose-Capillary: 98 mg/dL (ref 70–99)

## 2021-05-31 LAB — FERRITIN: Ferritin: 108 ng/mL (ref 24–336)

## 2021-05-31 LAB — APTT: aPTT: 33 seconds (ref 24–36)

## 2021-05-31 LAB — HEPATITIS B SURFACE ANTIGEN: Hepatitis B Surface Ag: NONREACTIVE

## 2021-05-31 LAB — GROUP A STREP BY PCR: Group A Strep by PCR: NOT DETECTED

## 2021-05-31 LAB — CK: Total CK: 411 U/L — ABNORMAL HIGH (ref 49–397)

## 2021-05-31 LAB — PROCALCITONIN: Procalcitonin: 1.1 ng/mL

## 2021-05-31 LAB — HIV ANTIBODY (ROUTINE TESTING W REFLEX): HIV Screen 4th Generation wRfx: NONREACTIVE

## 2021-05-31 LAB — TRIGLYCERIDES: Triglycerides: 43 mg/dL (ref <150)

## 2021-05-31 MED ORDER — ONDANSETRON HCL 4 MG/2ML IJ SOLN
4.0000 mg | Freq: Four times a day (QID) | INTRAMUSCULAR | Status: DC | PRN
  Administered 2021-06-11: 03:00:00 4 mg via INTRAVENOUS
  Filled 2021-05-31: qty 2

## 2021-05-31 MED ORDER — ACETAMINOPHEN 650 MG RE SUPP: 650.0000 mg | Freq: Four times a day (QID) | RECTAL | Status: DC | PRN

## 2021-05-31 MED ORDER — AMLODIPINE BESYLATE 10 MG PO TABS
10.0000 mg | ORAL_TABLET | Freq: Every day | ORAL | Status: DC
  Administered 2021-05-31: 10 mg via ORAL
  Filled 2021-05-31: qty 1

## 2021-05-31 MED ORDER — GUAIFENESIN-DM 100-10 MG/5ML PO SYRP: 5.0000 mL | ORAL_SOLUTION | ORAL | Status: DC | PRN

## 2021-05-31 MED ORDER — SODIUM CHLORIDE 0.9 % IV SOLN
10.0000 mL/h | Freq: Once | INTRAVENOUS | Status: AC
  Administered 2021-05-31: 10 mL/h via INTRAVENOUS

## 2021-05-31 MED ORDER — IBUPROFEN 400 MG PO TABS
400.0000 mg | ORAL_TABLET | Freq: Once | ORAL | Status: AC
  Administered 2021-05-31: 400 mg via ORAL
  Filled 2021-05-31: qty 1

## 2021-05-31 MED ORDER — HYDROMORPHONE HCL 1 MG/ML IJ SOLN
INTRAMUSCULAR | Status: AC
  Filled 2021-05-31: qty 1

## 2021-05-31 MED ORDER — ONDANSETRON HCL 4 MG PO TABS: 4.0000 mg | ORAL_TABLET | Freq: Three times a day (TID) | ORAL | 0 refills | Status: DC | PRN

## 2021-05-31 MED ORDER — INSULIN ASPART 100 UNIT/ML IJ SOLN: 0.0000 [IU] | Freq: Three times a day (TID) | INTRAMUSCULAR | Status: DC

## 2021-05-31 MED ORDER — ACETAMINOPHEN 325 MG PO TABS
650.0000 mg | ORAL_TABLET | Freq: Four times a day (QID) | ORAL | Status: DC | PRN
  Administered 2021-05-31 – 2021-06-01 (×2): 650 mg via ORAL
  Filled 2021-05-31 (×2): qty 2

## 2021-05-31 MED ORDER — HYDRALAZINE HCL 50 MG PO TABS
50.0000 mg | ORAL_TABLET | Freq: Two times a day (BID) | ORAL | Status: DC
  Administered 2021-05-31 (×2): 50 mg via ORAL
  Filled 2021-05-31 (×2): qty 1

## 2021-05-31 MED ORDER — HYDROMORPHONE HCL 1 MG/ML IJ SOLN
0.5000 mg | Freq: Once | INTRAMUSCULAR | Status: AC
Start: 2021-05-31 — End: 2021-05-31
  Administered 2021-05-31: 0.5 mg via INTRAVENOUS

## 2021-05-31 MED ORDER — STERILE WATER FOR INJECTION IV SOLN
INTRAVENOUS | Status: DC
  Filled 2021-05-31: qty 150
  Filled 2021-05-31 (×2): qty 1000
  Filled 2021-05-31: qty 150
  Filled 2021-05-31: qty 1000
  Filled 2021-05-31 (×2): qty 150

## 2021-05-31 MED ORDER — LACTATED RINGERS IV BOLUS
1000.0000 mL | Freq: Once | INTRAVENOUS | Status: AC
  Administered 2021-05-31: 1000 mL via INTRAVENOUS

## 2021-05-31 MED ORDER — GUAIFENESIN-DM 100-10 MG/5ML PO SYRP
10.0000 mL | ORAL_SOLUTION | Freq: Four times a day (QID) | ORAL | Status: DC | PRN
  Administered 2021-05-31: 10 mL via ORAL
  Filled 2021-05-31: qty 10

## 2021-05-31 MED ORDER — PANTOPRAZOLE SODIUM 40 MG PO TBEC
40.0000 mg | DELAYED_RELEASE_TABLET | Freq: Every day | ORAL | Status: DC
  Administered 2021-05-31: 40 mg via ORAL
  Filled 2021-05-31: qty 1

## 2021-05-31 MED ORDER — ACETAMINOPHEN 500 MG PO TABS
1000.0000 mg | ORAL_TABLET | Freq: Once | ORAL | Status: AC
  Administered 2021-05-31: 1000 mg via ORAL
  Filled 2021-05-31: qty 2

## 2021-05-31 MED ORDER — HYDROXYZINE HCL 10 MG PO TABS
10.0000 mg | ORAL_TABLET | Freq: Once | ORAL | Status: DC
  Filled 2021-05-31: qty 1

## 2021-05-31 MED ORDER — METHOCARBAMOL 500 MG PO TABS
500.0000 mg | ORAL_TABLET | Freq: Once | ORAL | Status: AC
  Administered 2021-05-31: 500 mg via ORAL
  Filled 2021-05-31: qty 1

## 2021-05-31 MED ORDER — ONDANSETRON HCL 4 MG PO TABS: 4.0000 mg | ORAL_TABLET | Freq: Four times a day (QID) | ORAL | Status: DC | PRN

## 2021-05-31 NOTE — ED Notes (Signed)
Pt frustrated w/ being hooked to monitoring equipment and wanting to know how much longer he'll need to be hooked up.  This Probation officer explained that he is being admitted to the hospital and will be hook up "for a while."  Pt asked "what if I change my mind?"  This Probation officer asked why he didn't want to stay and he just stated that he doesn't like hospitals.  This Probation officer offered to have the EDP come speak to him, but the Pt refused.

## 2021-05-31 NOTE — ED Notes (Addendum)
Pt headed to CT and then, IP room.  All belongings sent with Pt.

## 2021-05-31 NOTE — Significant Event (Signed)
Patient with Suspected Blood Transfusion Reaction witnessed by primary RN @1600 .  Patient developed a persistent, dry, non-productive cough accompanied by chills, tachycardia, SOB, and hypertension.  Blood transfusion was stopped and NS was flushed through the line with MD Agbata notified of clinical change.  MD advised to HOLD second unit of blood and stop current transfusion and continue infusion of continuous sodium bicarbonate fluids.  Blood bank was notified of transfusion reaction and remaining PRBCs along with tubing was transported to the blood bank.     1713 - MD Agbata notified of patient's worsening symptoms including back pain, anixety, tearfulness, tachycardia - HR sustaining in the 120s and continued persistent, dry cough despite administration of PRN robitussin.  MD ordered 1x dose of 0.5mg  Dilaudid for treatment of symptoms with Primary RN administering 1x dose at 1809.  Will con't to monitor patient for any clinical changes during shift.  1830: Patient is sleeping with no signs or symptoms of distress noted on assessment.  Heart rate has improved sustaining in the 110's and will continue to monitor for any changes.

## 2021-05-31 NOTE — ED Notes (Signed)
Pt transported to xray 

## 2021-05-31 NOTE — ED Notes (Signed)
Lab notified of Respiratory panel add on.

## 2021-05-31 NOTE — ED Provider Notes (Addendum)
[facility] Provider Note    None    (approximate)   History   Generalized Body Aches and Chills   HPI  Jonathan Mcmillan is a 26 y.o. male denying any PMH although on chart review with a past medical history of HTN, DM, GERD, and CKD (seen on chart review, d/c summary from 06/13/2020) who presents for assessment of 3 days of fevers, chills, cough nonproductive, nonbloody nonbilious vomiting and nonbloody diarrhea.  Patient has not had any rashes, urinary symptoms, earache, sore throat, focal chest pain or abdominal pain or any focal extremity or back pain.  He has not taken any medications for his symptoms.  He endorses intermittent THC use but denies any other illicit drug use or EtOH use.  Also endorses some tobacco abuse.  No other acute concerns at this time.      Physical Exam  Triage Vital Signs: ED Triage Vitals [05/31/21 0637]  Enc Vitals Group     BP (!) 155/110     Pulse Rate (!) 128     Resp 20     Temp (!) 100.4 F (38 C)     Temp Source Oral     SpO2 100 %     Weight 145 lb (65.8 kg)     Height 5\' 8"  (1.727 m)     Head Circumference      Peak Flow      Pain Score 10     Pain Loc      Pain Edu?      Excl. in [place]?     Most recent vital signs: Vitals:   05/31/21 0637  BP: (!) 155/110  Pulse: (!) 128  Resp: 20  Temp: (!) 100.4 F (38 C)  SpO2: 100%    General: Awake appears mildly uncomfortable shivering under blankets. CV:  Good peripheral perfusion.  2+ radial pulses.  Less than 2-second cap refill in extremities. Resp:  Normal effort.  Clear bilaterally. Abd:  No distention.  Soft throughout. Other:  Slightly dry mucous membranes.  Oropharynx is otherwise unremarkable.  Cranial nerves II through XII are grossly intact.  Patient does not have any neck stiffness.   ED Results / Procedures / Treatments  Labs (all labs ordered are listed, but only abnormal results are displayed) Labs Reviewed  RESP PANEL BY RT-PCR (FLU A&B,  COVID) ARPGX2  GROUP A STREP BY PCR  CULTURE, BLOOD (ROUTINE X 2)  CULTURE, BLOOD (ROUTINE X 2)  URINALYSIS, COMPLETE (UACMP) WITH MICROSCOPIC  CBC WITH DIFFERENTIAL/PLATELET  COMPREHENSIVE METABOLIC PANEL  LACTIC ACID, PLASMA  LACTIC ACID, PLASMA  LIPASE, BLOOD  PROTIME-INR  APTT  PROCALCITONIN     EKG  ECG remarkable for sinus tachycardia with a ventricular rate of 115, normal axis, unremarkable intervals without clear evidence of acute ischemia or significant arrhythmia.  Nonspecific change in aVL.  RADIOLOGY .  Chest x-ray ordered and reviewed by myself shows no clear focal consolidation, effusion, edema, pneumothorax or other clear acute thoracic process.  I also reviewed radiology interpretation and agree with the findings.   PROCEDURES:  Critical Care performed: No  Procedures   MEDICATIONS ORDERED IN ED: Medications  lactated ringers bolus 1,000 mL (has no administration in time range)  acetaminophen (TYLENOL) tablet 1,000 mg (1,000 mg Oral Given 05/31/21 0727)  ibuprofen (ADVIL) tablet 400 mg (400 mg Oral Given 05/31/21 0727)     IMPRESSION / MDM / ASSESSMENT AND PLAN / ED COURSE  I reviewed the triage  vital signs and the nursing notes.                              Differential diagnosis includes, but is not limited to viral bronchitis and URI with a component of gastroenteritis, bacterial pneumonia, dehydration, electrolyte derangements, kidney injury and possible sepsis.   Chest x-ray ordered and reviewed by myself shows no clear focal consolidation, effusion, edema, pneumothorax or other clear acute thoracic process.  I also reviewed radiology interpretation and agree with the findings.  COVID influenza PCR is negative.  Care patient taken over by sitting provider at approximately 8 AM.  Plan is to follow-up labs and urine and reassess.  I will also send a strep screen.  We will hold off on broad-spectrum antibiotics at this time as a continue to suspect  primarily likely an acute viral process with the primary etiology.  Will hydrate with IV fluids and given Tylenol & ibuprofen pending remainder of work-up.    Of note, patient initially told this provider he did not have any medical history he was aware of on arrival and so had initially been given a dose ibuprofen based on his history and exam. On review of old records history of CKD discovered clearly any nsaids should be avoided going forward in this patient.        FINAL CLINICAL IMPRESSION(S) / ED DIAGNOSES   Final diagnoses:  Hypertension, unspecified type  Acute cough  SIRS (systemic inflammatory response syndrome) ([place])     Rx / DC Orders   ED Discharge Orders          Ordered    ondansetron (ZOFRAN) 4 MG tablet  Every 8 hours PRN        05/31/21 0756             Note:  This document was prepared using Dragon voice recognition software and may include unintentional dictation errors.   Lucrezia Starch, MD 05/31/21 3716    Lucrezia Starch, MD 05/31/21 0830    Lucrezia Starch, MD 05/31/21 1034    Lucrezia Starch, MD 05/31/21 1046

## 2021-05-31 NOTE — Progress Notes (Signed)
[facility]  ROUNDING NOTE   Subjective:   Patient known to our practice from previous admissions. Patient follows with [facility], Dr. Radene Knee, who he last saw in 08/01/20 for chronic kidney disease stage IV. At that time, patient was asked to start lisinopril for renal protection. However he states he never picked this up. He has not taken any prescribed medications in several months. Patient states he has a distrust in the healthcare system after his toe amputation in April 2022.   Patient with fevers, chills, and body aches.   Found to have elevated creatinine of 13.86, hyponatremia, metabolic acidosis, uremia, hypocalcemia, and iron deficiency anemia.     Objective:  Vital signs in last 24 hours:  Temp:  [98 F (36.7 C)-102.6 F (39.2 C)] 98 F (36.7 C) (01/18 1110) Pulse Rate:  [100-128] 100 (01/18 1015) Resp:  [20-29] 29 (01/18 1015) BP: (128-155)/(68-110) 135/76 (01/18 1015) SpO2:  [96 %-100 %] 97 % (01/18 1015) Weight:  [65.8 kg] 65.8 kg (01/18 0637)  Weight change:  Filed Weights   05/31/21 0637  Weight: 65.8 kg    Intake/Output: No intake/output data recorded.   Intake/Output this shift:  No intake/output data recorded.  Physical Exam: General: NAD, cachectic  Head: Normocephalic, atraumatic. Moist oral mucosal membranes  Eyes: Anicteric, PERRL  Neck: Supple, trachea midline  Lungs:  Clear to auscultation  Heart: Regular rate and rhythm  Abdomen:  Soft, nontender  Extremities: No peripheral edema.  Neurologic: Nonfocal, moving all four extremities  Skin: No lesions       Basic Metabolic Panel: Recent Labs  Lab 05/31/21 0835  NA 131*  K 4.4  CL 102  CO2 15*  GLUCOSE 177*  BUN 90*  CREATININE 13.86*  CALCIUM 6.7*    Liver Function Tests: Recent Labs  Lab 05/31/21 0835  AST 24  ALT 33  ALKPHOS 70  BILITOT 0.6  PROT 6.3*  ALBUMIN 2.9*   Recent Labs  Lab 05/31/21 0835  LIPASE 29   No results for input(s): AMMONIA in  the last 168 hours.  CBC: Recent Labs  Lab 05/31/21 0835  WBC 12.7*  NEUTROABS 11.6*  HGB 5.2*  HCT 15.8*  MCV 89.8  PLT 172    Cardiac Enzymes: No results for input(s): CKTOTAL, CKMB, CKMBINDEX, TROPONINI in the last 168 hours.  BNP: Invalid input(s): POCBNP  CBG: No results for input(s): GLUCAP in the last 168 hours.  Microbiology: Results for orders placed or performed during the hospital encounter of 05/31/21  Resp Panel by RT-PCR (Flu A&B, Covid) Nasopharyngeal Swab     Status: None   Collection Time: 05/31/21  6:58 AM   Specimen: Nasopharyngeal Swab; Nasopharyngeal(NP) swabs in vial transport medium  Result Value Ref Range Status   SARS Coronavirus 2 by RT PCR NEGATIVE NEGATIVE Final    Comment: (NOTE) SARS-CoV-2 target nucleic acids are NOT DETECTED.  The SARS-CoV-2 RNA is generally detectable in upper respiratory specimens during the acute phase of infection. The lowest concentration of SARS-CoV-2 viral copies this assay can detect is 138 copies/mL. A negative result does not preclude SARS-Cov-2 infection and should not be used as the sole basis for treatment or other patient management decisions. A negative result may occur with  improper specimen collection/handling, submission of specimen other than nasopharyngeal swab, presence of viral mutation(s) within the areas targeted by this assay, and inadequate number of viral copies(<138 copies/mL). A negative result must be combined with clinical observations, patient history, and epidemiological information. The  expected result is Negative.  Fact Sheet for Patients:  EntrepreneurPulse.com.au  Fact Sheet for Healthcare Providers:  IncredibleEmployment.be  This test is no t yet approved or cleared by the [place] FDA and  has been authorized for detection and/or diagnosis of SARS-CoV-2 by FDA under an Emergency Use Authorization (EUA). This EUA will remain  in effect  (meaning this test can be used) for the duration of the COVID-19 declaration under Section 564(b)(1) of the Act, 21 U.S.C.section 360bbb-3(b)(1), unless the authorization is terminated  or revoked sooner.       Influenza A by PCR NEGATIVE NEGATIVE Final   Influenza B by PCR NEGATIVE NEGATIVE Final    Comment: (NOTE) The Xpert Xpress SARS-CoV-2/FLU/RSV plus assay is intended as an aid in the diagnosis of influenza from Nasopharyngeal swab specimens and should not be used as a sole basis for treatment. Nasal washings and aspirates are unacceptable for Xpert Xpress SARS-CoV-2/FLU/RSV testing.  Fact Sheet for Patients: EntrepreneurPulse.com.au  Fact Sheet for Healthcare Providers: IncredibleEmployment.be  This test is not yet approved or cleared by the [place] FDA and has been authorized for detection and/or diagnosis of SARS-CoV-2 by FDA under an Emergency Use Authorization (EUA). This EUA will remain in effect (meaning this test can be used) for the duration of the COVID-19 declaration under Section 564(b)(1) of the Act, 21 U.S.C. section 360bbb-3(b)(1), unless the authorization is terminated or revoked.  Performed at [facility], [place], [place] [address]   Group A Strep by PCR [facility] Only)     Status: None   Collection Time: 05/31/21  8:35 AM   Specimen: Throat; Sterile Swab  Result Value Ref Range Status   Group A Strep by PCR NOT DETECTED NOT DETECTED Final    Comment: Performed at [facility], [address] [place],  [address]    Coagulation Studies: Recent Labs    05/31/21 0835  LABPROT 15.8*  INR 1.3*    Urinalysis: Recent Labs    05/31/21 0927  COLORURINE YELLOW*  LABSPEC 1.012  PHURINE 6.0  GLUCOSEU 50*  HGBUR MODERATE*  BILIRUBINUR NEGATIVE  KETONESUR NEGATIVE  PROTEINUR >=300*  NITRITE NEGATIVE  LEUKOCYTESUR NEGATIVE      Imaging: DG Chest 2  View  Result Date: 05/31/2021 CLINICAL DATA:  Cough.  Fever. EXAM: CHEST - 2 VIEW COMPARISON:  Multiple chest XRs, most recently 03/08/2020. CT chest, 01/02/2020. FINDINGS: Cardiomediastinal silhouette is within normal limits given positioning and technique. Lungs are well inflated. No focal consolidation or mass. No pleural effusion or pneumothorax. No acute displaced fracture. IMPRESSION: No active cardiopulmonary disease. Electronically Signed   By: Michaelle Birks M.D.   On: 05/31/2021 07:34     Medications:     sodium bicarbonate (isotonic) infusion in sterile water      amLODipine  10 mg Oral Daily   hydrALAZINE  50 mg Oral BID   insulin aspart  0-9 Units Subcutaneous TID WC   pantoprazole  40 mg Oral Daily   acetaminophen **OR** acetaminophen, ondansetron **OR** ondansetron (ZOFRAN) IV  Assessment/ Plan:  [name] is a 26 y.o. black male with hypertension, insulin dependent diabetes mellitus type I, diabetic gastroparesis, diabetic neuropathy, tobacco use, THC use, right toe amputation, perirectal abscess, who is admitted to [facility] on 05/31/2021 for AKI (acute kidney injury) ([place]) [N17.9]  Acute kidney injury on chronic kidney disease stage IV versus progression of chronic kidney disease to end stage renal disease. Baseline creatinine of 4.13, GFR  of 19 on 08/26/20. History of nephrotic range proteinuria and hematuria. No history of renal biopsy. Strong family history of dialysis with mother with ESRD before passing.  - discussed dialysis with patient. If no improvement in renal function, patient will need dialysis.   - will discuss with [facility]  Metablic acidosis: acute. Secondary to renal failure.  - start bicarb infusion.   Hypocalcemia: suspect secondary to renal failure and secondary hyperparathyroidism - Corrected 7.6.  - hypocalcemia work up.   Anemia of chronic kidney disease and anemia of iron deficiency. Patient will need GI work up. Recommend IV iron if  infection free.   Hypertension with chronic kidney disease: 132/74. Currently on hydralazine.   Hyponatremia: secondary to renal failure. IVF as above.    LOS: 0 Lovina Zuver 1/18/202311:33 AM

## 2021-05-31 NOTE — H&P (Signed)
History and Physical    HAI GRABE UDT:[id number] DOB: 29-Oct-1995 DOA: 05/31/2021  [facility] [place]   Patient coming from: Home  I have personally briefly reviewed patient's old medical records in [place]  Chief Complaint: Body aches/fever  HPI: Jonathan Mcmillan is a 26 y.o. male with medical history significant for diabetes mellitus with complications of stage IV chronic kidney disease, hypertension, nicotine dependence who presents to the emergency room for evaluation of a 3-day history of fever, chills, nonproductive cough, myalgias and diarrhea.  He denies having any sick contacts. He denies having any urinary frequency, nocturia or dysuria and denies having any abdominal pain.  He denies having a headache, no sore throat, no chest pain, no shortness of breath, no back pain, no lower extremity swelling, no focal deficits of blurred vision.  He denies having any hematemesis, no melena stools or hematochezia and denies NSAID use. Upon arrival to the ER he was noted to have a T-max of 102.6, he was tachycardic and tachypneic.  Sodium 131, potassium 4.4, chloride 102, bicarb 15, glucose 177, BUN 19, creatinine 13.86 when compared to baseline of 3.92, calcium 6.7, alkaline phosphatase 70, albumin 2.9, lipase 29, AST 24, ALT 33, total protein 6.3, lactic acid 2.1, white count 12.7, hemoglobin 5.2 compared to baseline of 8.3, hematocrit 15.8, MCV 89.8, RDW 12.8, platelet count 172, PT 15.8, INR 1.3 Respiratory viral panel is negative Urine analysis shows proteinuria Chest x-ray reviewed by me shows no evidence of acute cardiopulmonary disease Twelve-lead EKG reviewed by me shows sinus tachycardia with nonspecific T wave changes in the lateral leads.    ED Course: Patient is a 26 year old African-American male with a known history of diabetes mellitus with stage IV chronic kidney disease who presents to the ER for evaluation of what appears to be a viral  syndrome associated with fever, nonproductive cough, chills and myalgias. His respiratory viral panel is negative and his labs reveal worsening of his renal function from baseline 3.92 >> 13.86. Urine analysis shows proteinuria. He will be admitted to the hospital for further evaluation.   Review of Systems: As per HPI otherwise all other systems reviewed and negative.    Past Medical History:  Diagnosis Date   Cannabinoid hyperemesis syndrome    Diabetes 1.5, managed as type 1 ([place])    Gastroparesis    Perirectal abscess 06/16/2017    Past Surgical History:  Procedure Laterality Date   INCISION AND DRAINAGE PERIRECTAL ABSCESS N/A 06/16/2017   Procedure: IRRIGATION AND DEBRIDEMENT PERIRECTAL ABSCESS;  Surgeon: Florene Glen, MD;  Location: [facility] ORS;  Service: General;  Laterality: N/A;   none       reports that he has been smoking cigarettes. He has been smoking an average of .5 packs per day. He has never used smokeless tobacco. He reports current drug use. Drug: Marijuana. He reports that he does not drink alcohol.  No Known Allergies  Family History  Problem Relation Age of Onset   Diabetes Mother       Prior to Admission medications   Medication Sig Start Date End Date Taking? Authorizing Provider  ondansetron (ZOFRAN) 4 MG tablet Take 1 tablet (4 mg total) by mouth every 8 (eight) hours as needed for up to 10 doses for nausea or vomiting. 05/31/21  Yes Lucrezia Starch, MD  amLODipine (NORVASC) 10 MG tablet Take 1 tablet (10 mg total) by mouth daily. 06/14/20   Fritzi Mandes, MD  blood glucose  meter kit and supplies KIT Dispense based on patient and insurance preference. Use up to four times daily as directed. (FOR ICD-9 250.00, 250.01). 08/02/19   Hinda Kehr, MD  blood glucose meter kit and supplies KIT Dispense based on patient and insurance preference. Use up to four times daily as directed. (FOR ICD-9 250.00, 250.01). 06/13/20   Fritzi Mandes, MD  hydrALAZINE (APRESOLINE)  50 MG tablet Take 1 tablet (50 mg total) by mouth 2 (two) times daily. 06/13/20   Fritzi Mandes, MD  omeprazole (PRILOSEC) 40 MG capsule Take 1 capsule (40 mg total) by mouth daily. 03/08/20 05/07/20  Arta Silence, MD  pantoprazole (PROTONIX) 40 MG tablet Take 1 tablet (40 mg total) by mouth daily. Patient not taking: Reported on 12/10/2018 09/10/18 01/02/20  Hillary Bow, MD  sucralfate (CARAFATE) 1 g tablet Take 1 tablet (1 g total) by mouth 4 (four) times daily. Patient not taking: Reported on 06/26/2019 02/04/19 01/02/20  Nance Pear, MD    Physical Exam: Vitals:   05/31/21 0930 05/31/21 1015 05/31/21 1110 05/31/21 1130  BP: 128/68 135/76  132/74  Pulse: (!) 101 100  92  Resp: (!) 21 (!) 29  (!) 29  Temp:   98 F (36.7 C)   TempSrc:   Oral   SpO2: 97% 97%  98%  Weight:      Height:         Vitals:   05/31/21 0930 05/31/21 1015 05/31/21 1110 05/31/21 1130  BP: 128/68 135/76  132/74  Pulse: (!) 101 100  92  Resp: (!) 21 (!) 29  (!) 29  Temp:   98 F (36.7 C)   TempSrc:   Oral   SpO2: 97% 97%  98%  Weight:      Height:          Constitutional: Alert and oriented x 3 . Not in any apparent distress. Chronically ill appearing HEENT:      Head: Normocephalic and atraumatic.         Eyes: PERLA, EOMI, Conjunctivae pallor. Sclera is non-icteric.       Mouth/Throat: Mucous membranes are moist.       Neck: Supple with no signs of meningismus. Cardiovascular: Regular rate and rhythm. No murmurs, gallops, or rubs. 2+ symmetrical distal pulses are present . No JVD. No LE edema Respiratory: Respiratory effort normal .Lungs sounds clear bilaterally. No wheezes, crackles, or rhonchi.  Gastrointestinal: Soft, non tender, and non distended with positive bowel sounds.  Genitourinary: No CVA tenderness. Musculoskeletal: Nontender with normal range of motion in all extremities. No cyanosis, or erythema of extremities. Neurologic:  Face is symmetric. Moving all extremities. No  gross focal neurologic deficits . Skin: Skin is warm, dry.  No rash or ulcers Psychiatric: Mood and affect are normal    Labs on Admission: I have personally reviewed following labs and imaging studies  CBC: Recent Labs  Lab 05/31/21 0835  WBC 12.7*  NEUTROABS 11.6*  HGB 5.2*  HCT 15.8*  MCV 89.8  PLT 287   Basic Metabolic Panel: Recent Labs  Lab 05/31/21 0835  NA 131*  K 4.4  CL 102  CO2 15*  GLUCOSE 177*  BUN 90*  CREATININE 13.86*  CALCIUM 6.7*   GFR: Estimated Creatinine Clearance: 7.5 mL/min (A) (by C-G formula based on SCr of 13.86 mg/dL (H)). Liver Function Tests: Recent Labs  Lab 05/31/21 0835  AST 24  ALT 33  ALKPHOS 70  BILITOT 0.6  PROT 6.3*  ALBUMIN 2.9*  Recent Labs  Lab 05/31/21 0835  LIPASE 29   No results for input(s): AMMONIA in the last 168 hours. Coagulation Profile: Recent Labs  Lab 05/31/21 0835  INR 1.3*   Cardiac Enzymes: No results for input(s): CKTOTAL, CKMB, CKMBINDEX, TROPONINI in the last 168 hours. BNP (last 3 results) No results for input(s): PROBNP in the last 8760 hours. HbA1C: No results for input(s): HGBA1C in the last 72 hours. CBG: No results for input(s): GLUCAP in the last 168 hours. Lipid Profile: Recent Labs    05/31/21 0835  TRIG 43   Thyroid Function Tests: No results for input(s): TSH, T4TOTAL, FREET4, T3FREE, THYROIDAB in the last 72 hours. Anemia Panel: Recent Labs    05/31/21 0844  FERRITIN 108  TIBC 195*  IRON 9*   Urine analysis:    Component Value Date/Time   COLORURINE YELLOW (A) 05/31/2021 0927   APPEARANCEUR CLEAR (A) 05/31/2021 0927   LABSPEC 1.012 05/31/2021 0927   PHURINE 6.0 05/31/2021 0927   GLUCOSEU 50 (A) 05/31/2021 0927   HGBUR MODERATE (A) 05/31/2021 0927   BILIRUBINUR NEGATIVE 05/31/2021 0927   KETONESUR NEGATIVE 05/31/2021 0927   PROTEINUR >=300 (A) 05/31/2021 0927   UROBILINOGEN 0.2 05/10/2007 2155   NITRITE NEGATIVE 05/31/2021 0927   LEUKOCYTESUR NEGATIVE  05/31/2021 0927    Radiological Exams on Admission: DG Chest 2 View  Result Date: 05/31/2021 CLINICAL DATA:  Cough.  Fever. EXAM: CHEST - 2 VIEW COMPARISON:  Multiple chest XRs, most recently 03/08/2020. CT chest, 01/02/2020. FINDINGS: Cardiomediastinal silhouette is within normal limits given positioning and technique. Lungs are well inflated. No focal consolidation or mass. No pleural effusion or pneumothorax. No acute displaced fracture. IMPRESSION: No active cardiopulmonary disease. Electronically Signed   By: Michaelle Birks M.D.   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 His baseline serum creatinine is 3.92 but today on admission it is 13.86 with an elevated BUN. He also has a mild anion gap acidosis without evidence of hyperkalemia We will start patient on a bicarbonate infusion Obtain renal ultrasound Avoid nephrotoxic agents Consult nephrology    Anemia due to chronic kidney disease Patient noted to have a significant drop in his H&H from 8.3g/dl to 5.2g/dl He denies any NSAID use and denies having any hematochezia, melena stools or hematemesis Obtain iron panel Check Hemoccult Will transfuse 2 units of packed RBC     Nicotine dependence Smoking cessation was discussed with patient in detail He declines a nicotine transdermal patch at this time   Diabetes mellitus with  complications of stage IV chronic kidney disease Maintain consistent carbohydrate diet Glycemic control with sliding scale insulin     Hypertension Blood pressure stable Continue amlodipine and hydralazine    Fever/myalgias Unclear etiology Urine analysis and chest x-ray are sterile Follow-up results of blood cultures Will obtain respiratory viral panel  DVT prophylaxis: SCD Code  Status: full code  Family Communication: Greater than 50% of time was spent discussing patient's condition and plan of care with him at the bedside.  All questions and concerns have been addressed.  He verbalizes understanding and agrees with the plan. Disposition Plan: Back to previous home environment Consults called: Nephrology Status:At the time of admission, it appears that the appropriate admission status for this patient is inpatient. This is judged to be reasonable and necessary to provide the required intensity of service to ensure the patient's safety given the presenting symptoms, physical exam findings, and initial radiographic and laboratory data in the context of their comorbid conditions. Patient requires inpatient status due to high intensity of service, high risk for further deterioration and high frequency of surveillance required.     Collier Bullock MD [facility]     05/31/2021, 11:40 AM

## 2021-05-31 NOTE — ED Notes (Signed)
Pt is denying history of anemia and denies dark stools/rectal bleeding or coughing up blood.

## 2021-05-31 NOTE — ED Notes (Addendum)
Pt noted to have drank another 8oz apple juice and another glass of ice cup.  Additional water provided.   This Probation officer educated Pt to only cover up w/ 1 sheet as to not increase fever.

## 2021-05-31 NOTE — ED Notes (Signed)
Pt noted to have drank 4oz apple juice and a glass of ice water.  Pt provided additional apple juice x2 and another glass of water.

## 2021-05-31 NOTE — ED Provider Notes (Signed)
.  Critical Care Performed by: Carrie Mew, MD Authorized by: Carrie Mew, MD   Critical care provider statement:    Critical care time (minutes):  33   Critical care time was exclusive of:  Separately billable procedures and treating other patients   Critical care was necessary to treat or prevent imminent or life-threatening deterioration of the following conditions:  Renal failure and circulatory failure   Critical care was time spent personally by me on the following activities:  Development of treatment plan with patient or surrogate, discussions with consultants, evaluation of patient's response to treatment, examination of patient, obtaining history from patient or surrogate, ordering and performing treatments and interventions, ordering and review of laboratory studies, ordering and review of radiographic studies, pulse oximetry, re-evaluation of patient's condition and review of old charts   Care discussed with: admitting provider       ----------------------------------------- 10:50 AM on 05/31/2021 -----------------------------------------  Labs show acute on chronic anemia with a current hemoglobin of 5.  Also acute on chronic renal failure with a current creatinine of 13.  No apparent infectious source right now, doubt sepsis but blood cultures are sent for follow-up.  Discussed with hospitalist for further management.  Patient provides verbal consent for blood transfusion for hemodynamic support.  Final diagnoses:  Hypertension, unspecified type  Acute cough  SIRS (systemic inflammatory response syndrome) (HCC)  Symptomatic anemia  Acute renal failure superimposed on stage 5 chronic kidney disease, not on chronic dialysis, unspecified acute renal failure type [facility])       Carrie Mew, MD 05/31/21 1052

## 2021-05-31 NOTE — Discharge Instructions (Addendum)
Please note your blood pressure was noted to be elevated today.  This could be because you are sick but I also recommend he have this rechecked in 7 to 10 days by primary care physician.

## 2021-05-31 NOTE — ED Triage Notes (Signed)
Pt to ED from home c/o body aches, chills, nausea since Sunday.  Has not taken COVID/Flu test at home, denies taking medications, denies known exposure to ill people.  Pt A&Ox4, chest rise even and unlabored.

## 2021-05-31 NOTE — ED Notes (Addendum)
Presents to the ER for 3 days of chills, generalized body aches, vomiting, fever and cough. Last episode of vomiting yesterday. Rigors. A&Ox4. Skin p/w/d. RR even and nonlabored. C/o pain to the chest and into the back. Dr. Tamala Julian present at bedside.

## 2021-06-01 ENCOUNTER — Inpatient Hospital Stay: Payer: Medicaid Other

## 2021-06-01 ENCOUNTER — Inpatient Hospital Stay (HOSPITAL_COMMUNITY)
Admit: 2021-06-01 | Discharge: 2021-06-01 | Disposition: A | Discharge status: Home / Self Care | Payer: Medicaid Other | Attending: Pulmonary Disease | Admitting: Pulmonary Disease

## 2021-06-01 DIAGNOSIS — I429 Cardiomyopathy, unspecified: Secondary | ICD-10-CM

## 2021-06-01 DIAGNOSIS — L8990 Pressure ulcer of unspecified site, unspecified stage: Secondary | ICD-10-CM | POA: Insufficient documentation

## 2021-06-01 DIAGNOSIS — I469 Cardiac arrest, cause unspecified: Secondary | ICD-10-CM

## 2021-06-01 DIAGNOSIS — N189 Chronic kidney disease, unspecified: Secondary | ICD-10-CM

## 2021-06-01 DIAGNOSIS — I959 Hypotension, unspecified: Secondary | ICD-10-CM

## 2021-06-01 DIAGNOSIS — A419 Sepsis, unspecified organism: Secondary | ICD-10-CM | POA: Insufficient documentation

## 2021-06-01 DIAGNOSIS — R509 Fever, unspecified: Secondary | ICD-10-CM

## 2021-06-01 DIAGNOSIS — E11649 Type 2 diabetes mellitus with hypoglycemia without coma: Secondary | ICD-10-CM

## 2021-06-01 DIAGNOSIS — N179 Acute kidney failure, unspecified: Secondary | ICD-10-CM

## 2021-06-01 DIAGNOSIS — D638 Anemia in other chronic diseases classified elsewhere: Secondary | ICD-10-CM

## 2021-06-01 HISTORY — DX: Cardiomyopathy, unspecified: I42.9

## 2021-06-01 HISTORY — DX: Cardiac arrest, cause unspecified: I46.9

## 2021-06-01 LAB — GLUCOSE, CAPILLARY
Glucose-Capillary: 108 mg/dL — ABNORMAL HIGH (ref 70–99)
Glucose-Capillary: 120 mg/dL — ABNORMAL HIGH (ref 70–99)
Glucose-Capillary: 43 mg/dL — CL (ref 70–99)
Glucose-Capillary: 49 mg/dL — ABNORMAL LOW (ref 70–99)
Glucose-Capillary: 49 mg/dL — ABNORMAL LOW (ref 70–99)
Glucose-Capillary: 62 mg/dL — ABNORMAL LOW (ref 70–99)
Glucose-Capillary: 63 mg/dL — ABNORMAL LOW (ref 70–99)
Glucose-Capillary: 64 mg/dL — ABNORMAL LOW (ref 70–99)
Glucose-Capillary: 75 mg/dL (ref 70–99)
Glucose-Capillary: 76 mg/dL (ref 70–99)
Glucose-Capillary: 79 mg/dL (ref 70–99)
Glucose-Capillary: 80 mg/dL (ref 70–99)
Glucose-Capillary: 85 mg/dL (ref 70–99)
Glucose-Capillary: 86 mg/dL (ref 70–99)
Glucose-Capillary: 88 mg/dL (ref 70–99)

## 2021-06-01 LAB — BASIC METABOLIC PANEL
Anion gap: 13 (ref 5–15)
BUN: 95 mg/dL — ABNORMAL HIGH (ref 6–20)
CO2: 14 mmol/L — ABNORMAL LOW (ref 22–32)
Calcium: 6.3 mg/dL — CL (ref 8.9–10.3)
Chloride: 101 mmol/L (ref 98–111)
Creatinine, Ser: 13.14 mg/dL — ABNORMAL HIGH (ref 0.61–1.24)
GFR, Estimated: 5 mL/min — ABNORMAL LOW (ref >60)
Glucose, Bld: 53 mg/dL — ABNORMAL LOW (ref 70–99)
Potassium: 4.3 mmol/L (ref 3.5–5.1)
Sodium: 128 mmol/L — ABNORMAL LOW (ref 135–145)

## 2021-06-01 LAB — URINE DRUG SCREEN, QUALITATIVE (ARMC ONLY)
Amphetamines, Ur Screen: NOT DETECTED
Barbiturates, Ur Screen: NOT DETECTED
Benzodiazepine, Ur Scrn: NOT DETECTED
Cannabinoid 50 Ng, Ur ~~LOC~~: POSITIVE — AB
Cocaine Metabolite,Ur ~~LOC~~: NOT DETECTED
MDMA (Ecstasy)Ur Screen: NOT DETECTED
Methadone Scn, Ur: NOT DETECTED
Opiate, Ur Screen: NOT DETECTED
Phencyclidine (PCP) Ur S: NOT DETECTED
Tricyclic, Ur Screen: NOT DETECTED

## 2021-06-01 LAB — BLOOD GAS, ARTERIAL
Acid-base deficit: 19.4 mmol/L — ABNORMAL HIGH (ref 0.0–2.0)
Acid-base deficit: 13.3 mmol/L — ABNORMAL HIGH (ref 0.0–2.0)
Bicarbonate: 8 mmol/L — ABNORMAL LOW (ref 20.0–28.0)
Bicarbonate: 12.9 mmol/L — ABNORMAL LOW (ref 20.0–28.0)
FIO2: 1
FIO2: 0.35
MECHVT: 450 mL
O2 Saturation: 100 %
O2 Saturation: 97.5 %
PEEP: 5 cmH2O
PEEP: 5 cmH2O
Patient temperature: 37
Patient temperature: 37
RATE: 18 resp/min
pCO2 arterial: 24 mmHg — ABNORMAL LOW (ref 32.0–48.0)
pCO2 arterial: 30 mmHg — ABNORMAL LOW (ref 32.0–48.0)
pH, Arterial: 7.13 — CL (ref 7.350–7.450)
pH, Arterial: 7.24 — ABNORMAL LOW (ref 7.350–7.450)
pO2, Arterial: 445 mmHg — ABNORMAL HIGH (ref 83.0–108.0)
pO2, Arterial: 112 mmHg — ABNORMAL HIGH (ref 83.0–108.0)

## 2021-06-01 LAB — URINALYSIS, COMPLETE (UACMP) WITH MICROSCOPIC
Bacteria, UA: NONE SEEN
Bilirubin Urine: NEGATIVE
Glucose, UA: 50 mg/dL — AB
Ketones, ur: NEGATIVE mg/dL
Nitrite: NEGATIVE
Protein, ur: >=300 mg/dL — AB
RBC / HPF: >50 RBC/hpf — ABNORMAL HIGH (ref 0–5)
Specific Gravity, Urine: 1.017 (ref 1.005–1.030)
WBC, UA: >50 WBC/hpf — ABNORMAL HIGH (ref 0–5)
pH: 6 (ref 5.0–8.0)

## 2021-06-01 LAB — CBC
HCT: 17.4 % — ABNORMAL LOW (ref 39.0–52.0)
HCT: 21.9 % — ABNORMAL LOW (ref 39.0–52.0)
Hemoglobin: 5.9 g/dL — ABNORMAL LOW (ref 13.0–17.0)
Hemoglobin: 7.7 g/dL — ABNORMAL LOW (ref 13.0–17.0)
MCH: 29.1 pg (ref 26.0–34.0)
MCH: 30.8 pg (ref 26.0–34.0)
MCHC: 33.9 g/dL (ref 30.0–36.0)
MCHC: 35.2 g/dL (ref 30.0–36.0)
MCV: 85.7 fL (ref 80.0–100.0)
MCV: 87.6 fL (ref 80.0–100.0)
Platelets: 124 10*3/uL — ABNORMAL LOW (ref 150–400)
Platelets: 100 10*3/uL — ABNORMAL LOW (ref 150–400)
RBC: 2.03 MIL/uL — ABNORMAL LOW (ref 4.22–5.81)
RBC: 2.5 MIL/uL — ABNORMAL LOW (ref 4.22–5.81)
RDW: 12.8 % (ref 11.5–15.5)
RDW: 13.2 % (ref 11.5–15.5)
WBC: 4.5 10*3/uL (ref 4.0–10.5)
WBC: 4.9 10*3/uL (ref 4.0–10.5)
nRBC: 0 % (ref 0.0–0.2)
nRBC: 0 % (ref 0.0–0.2)

## 2021-06-01 LAB — ECHOCARDIOGRAM COMPLETE
AR max vel: 2.4 cm2
AV Area VTI: 2.64 cm2
AV Area mean vel: 2.23 cm2
AV Mean grad: 6 mmHg
AV Peak grad: 10.1 mmHg
Ao pk vel: 1.59 m/s
Area-P 1/2: 5.16 cm2
Height: 68 in
MV VTI: 2.47 cm2
S' Lateral: 3.79 cm
Weight: 2320 oz

## 2021-06-01 LAB — ALBUMIN: Albumin: 2.2 g/dL — ABNORMAL LOW (ref 3.5–5.0)

## 2021-06-01 LAB — RENAL FUNCTION PANEL
Albumin: 2.1 g/dL — ABNORMAL LOW (ref 3.5–5.0)
Anion gap: 22 — ABNORMAL HIGH (ref 5–15)
BUN: 91 mg/dL — ABNORMAL HIGH (ref 6–20)
CO2: 15 mmol/L — ABNORMAL LOW (ref 22–32)
Calcium: 6.6 mg/dL — ABNORMAL LOW (ref 8.9–10.3)
Chloride: 96 mmol/L — ABNORMAL LOW (ref 98–111)
Creatinine, Ser: 12.67 mg/dL — ABNORMAL HIGH (ref 0.61–1.24)
GFR, Estimated: 5 mL/min — ABNORMAL LOW (ref >60)
Glucose, Bld: 65 mg/dL — ABNORMAL LOW (ref 70–99)
Phosphorus: 7.6 mg/dL — ABNORMAL HIGH (ref 2.5–4.6)
Potassium: 4.1 mmol/L (ref 3.5–5.1)
Sodium: 133 mmol/L — ABNORMAL LOW (ref 135–145)

## 2021-06-01 LAB — PROCALCITONIN: Procalcitonin: 108.03 ng/mL

## 2021-06-01 LAB — PROTIME-INR
INR: 2.3 — ABNORMAL HIGH (ref 0.8–1.2)
Prothrombin Time: 25 seconds — ABNORMAL HIGH (ref 11.4–15.2)

## 2021-06-01 LAB — COMPREHENSIVE METABOLIC PANEL
ALT: 78 U/L — ABNORMAL HIGH (ref 0–44)
AST: 101 U/L — ABNORMAL HIGH (ref 15–41)
Albumin: 2 g/dL — ABNORMAL LOW (ref 3.5–5.0)
Alkaline Phosphatase: 68 U/L (ref 38–126)
Anion gap: 22 — ABNORMAL HIGH (ref 5–15)
BUN: 88 mg/dL — ABNORMAL HIGH (ref 6–20)
CO2: 11 mmol/L — ABNORMAL LOW (ref 22–32)
Calcium: 6.5 mg/dL — ABNORMAL LOW (ref 8.9–10.3)
Chloride: 98 mmol/L (ref 98–111)
Creatinine, Ser: 12.78 mg/dL — ABNORMAL HIGH (ref 0.61–1.24)
GFR, Estimated: 5 mL/min — ABNORMAL LOW (ref >60)
Glucose, Bld: 104 mg/dL — ABNORMAL HIGH (ref 70–99)
Potassium: 3.6 mmol/L (ref 3.5–5.1)
Sodium: 131 mmol/L — ABNORMAL LOW (ref 135–145)
Total Bilirubin: 1.1 mg/dL (ref 0.3–1.2)
Total Protein: 4.7 g/dL — ABNORMAL LOW (ref 6.5–8.1)

## 2021-06-01 LAB — HEPATITIS B CORE ANTIBODY, IGM: Hep B C IgM: NONREACTIVE

## 2021-06-01 LAB — TROPONIN I (HIGH SENSITIVITY)
Troponin I (High Sensitivity): 267 ng/L (ref <18)
Troponin I (High Sensitivity): 290 ng/L (ref <18)

## 2021-06-01 LAB — LACTIC ACID, PLASMA
Lactic Acid, Venous: 8.3 mmol/L (ref 0.5–1.9)
Lactic Acid, Venous: 6.5 mmol/L (ref 0.5–1.9)

## 2021-06-01 LAB — HEPATITIS B CORE ANTIBODY, TOTAL: Hep B Core Total Ab: NONREACTIVE

## 2021-06-01 LAB — HEPATITIS B SURFACE ANTIBODY,QUALITATIVE: Hep B S Ab: NONREACTIVE

## 2021-06-01 LAB — PREPARE RBC (CROSSMATCH)

## 2021-06-01 LAB — HEPATITIS C ANTIBODY: HCV Ab: NONREACTIVE

## 2021-06-01 LAB — PHOSPHORUS: Phosphorus: 5.6 mg/dL — ABNORMAL HIGH (ref 2.5–4.6)

## 2021-06-01 MED ORDER — CHLORHEXIDINE GLUCONATE CLOTH 2 % EX PADS
6.0000 | MEDICATED_PAD | Freq: Every day | CUTANEOUS | Status: DC
  Administered 2021-06-01: 6 via TOPICAL

## 2021-06-01 MED ORDER — FENTANYL 2500MCG IN NS 250ML (10MCG/ML) PREMIX INFUSION
50.0000 ug/h | INTRAVENOUS | Status: DC
  Administered 2021-06-01: 50 ug/h via INTRAVENOUS
  Administered 2021-06-02: 75 ug/h via INTRAVENOUS
  Filled 2021-06-01 (×2): qty 250

## 2021-06-01 MED ORDER — MIDAZOLAM HCL 2 MG/2ML IJ SOLN
INTRAMUSCULAR | Status: AC
  Administered 2021-06-01: 2 mg
  Filled 2021-06-01: qty 2

## 2021-06-01 MED ORDER — DEXTROSE 10 % IV SOLN: INTRAVENOUS | Status: DC

## 2021-06-01 MED ORDER — SODIUM CHLORIDE 0.9 % FOR CRRT
INTRAVENOUS_CENTRAL | Status: DC | PRN
  Filled 2021-06-01: qty 1000

## 2021-06-01 MED ORDER — MIDAZOLAM HCL 2 MG/2ML IJ SOLN
2.0000 mg | Freq: Once | INTRAMUSCULAR | Status: DC
Start: 2021-06-01 — End: 2021-06-01

## 2021-06-01 MED ORDER — GLUCOSE 4 G PO CHEW: 1.0000 | CHEWABLE_TABLET | Freq: Once | ORAL | Status: DC

## 2021-06-01 MED ORDER — PRISMASOL BGK 0/2.5 32-2.5 MEQ/L EC SOLN
Status: DC
  Filled 2021-06-01 (×23): qty 5000

## 2021-06-01 MED ORDER — FENTANYL BOLUS VIA INFUSION
50.0000 ug | INTRAVENOUS | Status: DC | PRN
  Administered 2021-06-01: 100 ug via INTRAVENOUS
  Filled 2021-06-01: qty 100

## 2021-06-01 MED ORDER — ORAL CARE MOUTH RINSE
15.0000 mL | OROMUCOSAL | Status: DC
  Administered 2021-06-02 – 2021-06-03 (×14): 15 mL via OROMUCOSAL

## 2021-06-01 MED ORDER — CHLORHEXIDINE GLUCONATE CLOTH 2 % EX PADS
6.0000 | MEDICATED_PAD | Freq: Every day | CUTANEOUS | Status: DC
  Administered 2021-06-01 – 2021-06-22 (×14): 6 via TOPICAL

## 2021-06-01 MED ORDER — VANCOMYCIN HCL 750 MG/150ML IV SOLN
750.0000 mg | INTRAVENOUS | Status: DC
  Administered 2021-06-02: 750 mg via INTRAVENOUS
  Filled 2021-06-01 (×2): qty 150

## 2021-06-01 MED ORDER — ACETAMINOPHEN 650 MG RE SUPP: 650.0000 mg | Freq: Four times a day (QID) | RECTAL | Status: DC | PRN

## 2021-06-01 MED ORDER — SODIUM BICARBONATE 8.4 % IV SOLN
INTRAVENOUS | Status: AC
  Administered 2021-06-01: 50 meq
  Filled 2021-06-01: qty 100

## 2021-06-01 MED ORDER — DOCUSATE SODIUM 50 MG/5ML PO LIQD
100.0000 mg | Freq: Two times a day (BID) | ORAL | Status: DC
  Administered 2021-06-06 – 2021-06-07 (×2): 100 mg
  Filled 2021-06-01 (×5): qty 10

## 2021-06-01 MED ORDER — SODIUM CHLORIDE 0.9 % IV SOLN
1.0000 g | INTRAVENOUS | Status: DC
  Administered 2021-06-01: 1 g via INTRAVENOUS
  Filled 2021-06-01: qty 10

## 2021-06-01 MED ORDER — DEXTROSE 50 % IV SOLN
1.0000 | Freq: Once | INTRAVENOUS | Status: AC
  Administered 2021-06-01: 50 mL via INTRAVENOUS

## 2021-06-01 MED ORDER — GLUCOSE 4 G PO CHEW
CHEWABLE_TABLET | ORAL | Status: AC
  Filled 2021-06-01: qty 1

## 2021-06-01 MED ORDER — FENTANYL CITRATE PF 50 MCG/ML IJ SOSY
50.0000 ug | PREFILLED_SYRINGE | Freq: Once | INTRAMUSCULAR | Status: AC
  Administered 2021-06-01: 50 ug via INTRAVENOUS

## 2021-06-01 MED ORDER — SODIUM BICARBONATE 8.4 % IV SOLN: 100.0000 meq | Freq: Once | INTRAVENOUS | Status: AC

## 2021-06-01 MED ORDER — SODIUM CHLORIDE 0.9 % IV BOLUS
1000.0000 mL | Freq: Once | INTRAVENOUS | Status: AC
  Administered 2021-06-01: 1000 mL via INTRAVENOUS

## 2021-06-01 MED ORDER — MIDAZOLAM HCL 2 MG/2ML IJ SOLN
2.0000 mg | Freq: Once | INTRAMUSCULAR | Status: AC
  Administered 2021-06-01: 2 mg via INTRAVENOUS
  Filled 2021-06-01: qty 2

## 2021-06-01 MED ORDER — ACETAMINOPHEN 650 MG RE SUPP: 650.0000 mg | RECTAL | Status: DC

## 2021-06-01 MED ORDER — SODIUM BICARBONATE 8.4 % IV SOLN
150.0000 meq | Freq: Once | INTRAVENOUS | Status: AC
  Administered 2021-06-01: 150 meq via INTRAVENOUS

## 2021-06-01 MED ORDER — MIDAZOLAM HCL 2 MG/2ML IJ SOLN
INTRAMUSCULAR | Status: AC
  Filled 2021-06-01: qty 2

## 2021-06-01 MED ORDER — GLUCAGON HCL RDNA (DIAGNOSTIC) 1 MG IJ SOLR
INTRAMUSCULAR | Status: AC
  Filled 2021-06-01: qty 1

## 2021-06-01 MED ORDER — SIMETHICONE 80 MG PO CHEW
160.0000 mg | CHEWABLE_TABLET | Freq: Once | ORAL | Status: AC
  Administered 2021-06-01: 160 mg via ORAL
  Filled 2021-06-01: qty 2

## 2021-06-01 MED ORDER — HYDROCORTISONE SOD SUC (PF) 100 MG IJ SOLR
100.0000 mg | Freq: Two times a day (BID) | INTRAMUSCULAR | Status: DC
  Administered 2021-06-01 – 2021-06-03 (×5): 100 mg via INTRAVENOUS
  Filled 2021-06-01 (×5): qty 2

## 2021-06-01 MED ORDER — DEXTROSE 50 % IV SOLN
12.5000 g | INTRAVENOUS | Status: AC
  Administered 2021-06-01: 12.5 g via INTRAVENOUS

## 2021-06-01 MED ORDER — SODIUM BICARBONATE 8.4 % IV SOLN
INTRAVENOUS | Status: AC
  Filled 2021-06-01: qty 50

## 2021-06-01 MED ORDER — MIDAZOLAM HCL 2 MG/2ML IJ SOLN
2.0000 mg | INTRAMUSCULAR | Status: DC | PRN
  Administered 2021-06-01 – 2021-06-02 (×4): 2 mg via INTRAVENOUS
  Filled 2021-06-01 (×3): qty 2

## 2021-06-01 MED ORDER — VANCOMYCIN HCL 1250 MG/250ML IV SOLN
1250.0000 mg | Freq: Once | INTRAVENOUS | Status: AC
  Administered 2021-06-01: 1250 mg via INTRAVENOUS
  Filled 2021-06-01: qty 250

## 2021-06-01 MED ORDER — MIDAZOLAM HCL 2 MG/2ML IJ SOLN
2.0000 mg | INTRAMUSCULAR | Status: AC | PRN
  Administered 2021-06-01 – 2021-06-03 (×3): 2 mg via INTRAVENOUS
  Filled 2021-06-01 (×2): qty 2

## 2021-06-01 MED ORDER — BUSPIRONE HCL 15 MG PO TABS
30.0000 mg | ORAL_TABLET | Freq: Three times a day (TID) | ORAL | Status: DC
  Filled 2021-06-01 (×4): qty 2

## 2021-06-01 MED ORDER — NOREPINEPHRINE 4 MG/250ML-% IV SOLN
INTRAVENOUS | Status: AC
  Filled 2021-06-01: qty 250

## 2021-06-01 MED ORDER — ACETAMINOPHEN 325 MG PO TABS
650.0000 mg | ORAL_TABLET | Freq: Once | ORAL | Status: DC
  Filled 2021-06-01: qty 2

## 2021-06-01 MED ORDER — PIPERACILLIN-TAZOBACTAM 3.375 G IVPB 30 MIN
3.3750 g | Freq: Once | INTRAVENOUS | Status: DC
  Filled 2021-06-01 (×2): qty 50

## 2021-06-01 MED ORDER — ACETAMINOPHEN 325 MG PO TABS
650.0000 mg | ORAL_TABLET | Freq: Four times a day (QID) | ORAL | Status: DC | PRN
  Administered 2021-06-01: 650 mg via ORAL

## 2021-06-01 MED ORDER — SODIUM CHLORIDE 0.9 % IV BOLUS
500.0000 mL | Freq: Once | INTRAVENOUS | Status: AC
Start: 2021-06-01 — End: 2021-06-01
  Administered 2021-06-01: 500 mL via INTRAVENOUS

## 2021-06-01 MED ORDER — PRISMASOL BGK 0/2.5 32-2.5 MEQ/L EC SOLN
Status: DC
  Filled 2021-06-01 (×92): qty 5000

## 2021-06-01 MED ORDER — CHLORHEXIDINE GLUCONATE CLOTH 2 % EX PADS
6.0000 | MEDICATED_PAD | Freq: Every day | CUTANEOUS | Status: DC
  Administered 2021-06-03: 6 via TOPICAL

## 2021-06-01 MED ORDER — PROPOFOL 1000 MG/100ML IV EMUL
0.0000 ug/kg/min | INTRAVENOUS | Status: DC
  Administered 2021-06-01: 25 ug/kg/min via INTRAVENOUS
  Administered 2021-06-01 – 2021-06-02 (×3): 50 ug/kg/min via INTRAVENOUS
  Filled 2021-06-01 (×5): qty 100

## 2021-06-01 MED ORDER — HEPARIN SODIUM (PORCINE) 1000 UNIT/ML DIALYSIS
1000.0000 [IU] | INTRAMUSCULAR | Status: DC | PRN
  Administered 2021-06-01: 3000 [IU] via INTRAVENOUS_CENTRAL
  Administered 2021-06-03: 2600 [IU] via INTRAVENOUS_CENTRAL
  Administered 2021-06-05 (×2): 1800 [IU] via INTRAVENOUS_CENTRAL
  Administered 2021-06-09: 1400 [IU] via INTRAVENOUS_CENTRAL
  Administered 2021-06-10: 3000 [IU] via INTRAVENOUS_CENTRAL
  Filled 2021-06-01 (×2): qty 6
  Filled 2021-06-01: qty 2
  Filled 2021-06-01: qty 4
  Filled 2021-06-01 (×4): qty 6
  Filled 2021-06-01: qty 2
  Filled 2021-06-01: qty 6
  Filled 2021-06-01: qty 2
  Filled 2021-06-01: qty 6
  Filled 2021-06-01 (×2): qty 1
  Filled 2021-06-01 (×2): qty 4

## 2021-06-01 MED ORDER — CHLORHEXIDINE GLUCONATE 0.12% ORAL RINSE (MEDLINE KIT)
15.0000 mL | Freq: Two times a day (BID) | OROMUCOSAL | Status: DC
  Administered 2021-06-01 – 2021-06-03 (×4): 15 mL via OROMUCOSAL

## 2021-06-01 MED ORDER — PANTOPRAZOLE SODIUM 40 MG IV SOLR
40.0000 mg | Freq: Every day | INTRAVENOUS | Status: DC
  Administered 2021-06-01 – 2021-06-03 (×3): 40 mg via INTRAVENOUS
  Filled 2021-06-01 (×3): qty 40

## 2021-06-01 MED ORDER — SODIUM CHLORIDE 0.9% IV SOLUTION: Freq: Once | INTRAVENOUS | Status: AC

## 2021-06-01 MED ORDER — DIPHENHYDRAMINE HCL 25 MG PO CAPS: 25.0000 mg | ORAL_CAPSULE | Freq: Once | ORAL | Status: DC

## 2021-06-01 MED ORDER — ACETAMINOPHEN 325 MG PO TABS: 650.0000 mg | ORAL_TABLET | ORAL | Status: DC

## 2021-06-01 MED ORDER — ACETAMINOPHEN 160 MG/5ML PO SOLN
650.0000 mg | ORAL | Status: DC
  Filled 2021-06-01 (×6): qty 20.3

## 2021-06-01 MED ORDER — ALPRAZOLAM 0.25 MG PO TABS
0.2500 mg | ORAL_TABLET | Freq: Once | ORAL | Status: AC
  Administered 2021-06-01: 0.25 mg via ORAL
  Filled 2021-06-01: qty 1

## 2021-06-01 MED ORDER — PIPERACILLIN-TAZOBACTAM 3.375 G IVPB 30 MIN
3.3750 g | Freq: Four times a day (QID) | INTRAVENOUS | Status: DC
  Administered 2021-06-01 – 2021-06-04 (×12): 3.375 g via INTRAVENOUS
  Filled 2021-06-01 (×23): qty 50

## 2021-06-01 MED ORDER — FENTANYL CITRATE PF 50 MCG/ML IJ SOSY
PREFILLED_SYRINGE | INTRAMUSCULAR | Status: AC
  Administered 2021-06-01: 50 ug
  Filled 2021-06-01: qty 1

## 2021-06-01 MED ORDER — POLYETHYLENE GLYCOL 3350 17 G PO PACK: 17.0000 g | PACK | Freq: Every day | ORAL | Status: DC

## 2021-06-01 MED ORDER — OXYCODONE HCL 5 MG PO TABS: 5.0000 mg | ORAL_TABLET | ORAL | Status: DC | PRN

## 2021-06-01 MED ORDER — FENTANYL CITRATE PF 50 MCG/ML IJ SOSY
PREFILLED_SYRINGE | INTRAMUSCULAR | Status: AC
  Filled 2021-06-01: qty 1

## 2021-06-01 MED ORDER — NOREPINEPHRINE 4 MG/250ML-% IV SOLN
0.0000 ug/min | INTRAVENOUS | Status: DC
  Administered 2021-06-01: 8 ug/min via INTRAVENOUS
  Administered 2021-06-01: 5 ug/min via INTRAVENOUS
  Administered 2021-06-02: 13 ug/min via INTRAVENOUS
  Administered 2021-06-02: 5 ug/min via INTRAVENOUS
  Administered 2021-06-02: 13 ug/min via INTRAVENOUS
  Administered 2021-06-02 – 2021-06-03 (×2): 11 ug/min via INTRAVENOUS
  Administered 2021-06-03: 8 ug/min via INTRAVENOUS
  Administered 2021-06-04: 13 ug/min via INTRAVENOUS
  Administered 2021-06-04: 8 ug/min via INTRAVENOUS
  Filled 2021-06-01 (×9): qty 250

## 2021-06-01 MED ORDER — BUSPIRONE HCL 15 MG PO TABS
30.0000 mg | ORAL_TABLET | Freq: Three times a day (TID) | ORAL | Status: DC
  Administered 2021-06-01 – 2021-06-02 (×2): 30 mg
  Filled 2021-06-01 (×4): qty 2

## 2021-06-01 NOTE — TOC Initial Note (Signed)
Transition of Care [facility]) - Initial/Assessment Note    Patient Details  Name: Jonathan Mcmillan MRN: [id number] Date of Birth: 05-Oct-1995  Transition of Care [facility]) CM/SW Contact:    Shelbie Hutching, RN Phone Number: 06/01/2021, 10:55 AM  Clinical Narrative:                 Patient admitted to the hospital with SIRS, hypertension, history of stage IV kidney disease.  Patient coded this morning, transferred to the ICU, intubated and critically ill.  Family has been at the bedside this morning being supported by the [facility].  TOC will follow.         Patient Goals and CMS Choice        Expected Discharge Plan and Services                                                Prior Living Arrangements/Services                       Activities of Daily Living Home Assistive Devices/Equipment: None ADL Screening (condition at time of admission) Patient's cognitive ability adequate to safely complete daily activities?: Yes Is the patient deaf or have difficulty hearing?: No Does the patient have difficulty seeing, even when wearing glasses/contacts?: No Does the patient have difficulty concentrating, remembering, or making decisions?: No Patient able to express need for assistance with ADLs?: Yes Does the patient have difficulty dressing or bathing?: No Independently performs ADLs?: Yes (appropriate for developmental age) Does the patient have difficulty walking or climbing stairs?: No Weakness of Legs: None Weakness of Arms/Hands: None  Permission Sought/Granted                  Emotional Assessment              Admission diagnosis:  SIRS (systemic inflammatory response syndrome) ([place]) [R65.10] AKI (acute kidney injury) ([place]) [N17.9] Symptomatic anemia [D64.9] Acute renal failure superimposed on stage 5 chronic kidney disease, not on chronic dialysis, unspecified acute renal failure type ([place]) [N17.9, N18.5] Hypertension, unspecified type  [I10] Acute cough [R05.1] Patient Active Problem List   Diagnosis Date Noted   Hypotension    Fever    Cardiopulmonary arrest ([place])    Type 2 diabetes mellitus with hypoglycemia without coma, without long-term current use of insulin ([place]) 05/31/2021   Nicotine dependence 05/31/2021   Anemia of chronic disease 05/31/2021   Acute kidney injury superimposed on CKD ([place]) 06/11/2020   Nausea    Polysubstance abuse ([place])    AKI (acute kidney injury) ([place]) 06/10/2020   Abdominal pain 09/09/2018   Right arm cellulitis 01/26/2018   Sepsis ([place]) 03/06/2017   Malnutrition of moderate degree 05/22/2016   DKA (diabetic ketoacidoses) 05/21/2016   DM (diabetes mellitus), 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[place]) 35/46/5681   Hypertension 10/05/2010   Obesity 10/05/2010   PCP:  [facility] [place]:   [id number] [place], [place] - [address] [place] [address] Phone: [phone] Fax: [phone]     Social Determinants of Health (SDOH) Interventions    Readmission Risk Interventions Readmission Risk Prevention Plan 06/13/2020  Transportation Screening Complete  Palliative Care Screening Not Applicable  Medication Review (RN Care Manager) Complete  Some recent data might be hidden

## 2021-06-01 NOTE — Progress Notes (Signed)
[facility]  ROUNDING NOTE   Subjective:   Patient known to our practice from previous admissions. Patient follows with [facility], Dr. Radene Knee, who he last saw in 08/01/20 for chronic kidney disease stage IV.   Patient ill appearing today, mumbled, garbled speech Hypoglycemic event this am Reports shortness of breath, room air  Creatinine remains elevated 13.14, BUN 95, CO2 14 Corrected calcium 7.7 3 urine occurrences recorded in 24 hours   Objective:  Vital signs in last 24 hours:  Temp:  [97.4 F (36.3 C)-100.1 F (37.8 C)] 99.6 F (37.6 C) (01/19 0312) Pulse Rate:  [69-128] 98 (01/19 1030) Resp:  [18-29] 20 (01/19 1030) BP: (78-194)/(53-136) 132/77 (01/19 1030) SpO2:  [90 %-100 %] 100 % (01/19 1057) FiO2 (%):  [35 %-100 %] 35 % (01/19 1057)  Weight change:  Filed Weights   05/31/21 0637  Weight: 65.8 kg    Intake/Output: I/O last 3 completed shifts: In: 1352.3 [I.V.:1054.8; Blood:297.5] Out: -    Intake/Output this shift:  No intake/output data recorded.  Physical Exam: General: NAD, cachectic  Head: Normocephalic, atraumatic. Moist oral mucosal membranes  Eyes: Anicteric  Lungs:  Clear to auscultation, normal effort   Heart: Regular rate and rhythm  Abdomen:  Soft, nontender  Extremities: No peripheral edema.  Neurologic: Nonfocal, moving all four extremities  Skin: No lesions       Basic Metabolic Panel: Recent Labs  Lab 05/31/21 0835 06/01/21 0404  NA 131* 128*  K 4.4 4.3  CL 102 101  CO2 15* 14*  GLUCOSE 177* 53*  BUN 90* 95*  CREATININE 13.86* 13.14*  CALCIUM 6.7* 6.3*  PHOS  --  5.6*     Liver Function Tests: Recent Labs  Lab 05/31/21 0835 06/01/21 0404  AST 24  --   ALT 33  --   ALKPHOS 70  --   BILITOT 0.6  --   PROT 6.3*  --   ALBUMIN 2.9* 2.2*    Recent Labs  Lab 05/31/21 0835  LIPASE 29    No results for input(s): AMMONIA in the last 168 hours.  CBC: Recent Labs  Lab 05/31/21 0835  06/01/21 0404  WBC 12.7* 4.5  NEUTROABS 11.6*  --   HGB 5.2* 5.9*  HCT 15.8* 17.4*  MCV 89.8 85.7  PLT 172 124*     Cardiac Enzymes: Recent Labs  Lab 05/31/21 1114  CKTOTAL 411*    BNP: Invalid input(s): POCBNP  CBG: Recent Labs  Lab 06/01/21 0624 06/01/21 0700 06/01/21 0807 06/01/21 1001 06/01/21 1048  GLUCAP 76 86 80 85 88    Microbiology: Results for orders placed or performed during the hospital encounter of 05/31/21  Resp Panel by RT-PCR (Flu A&B, Covid) Nasopharyngeal Swab     Status: None   Collection Time: 05/31/21  6:58 AM   Specimen: Nasopharyngeal Swab; Nasopharyngeal(NP) swabs in vial transport medium  Result Value Ref Range Status   SARS Coronavirus 2 by RT PCR NEGATIVE NEGATIVE Final    Comment: (NOTE) SARS-CoV-2 target nucleic acids are NOT DETECTED.  The SARS-CoV-2 RNA is generally detectable in upper respiratory specimens during the acute phase of infection. The lowest concentration of SARS-CoV-2 viral copies this assay can detect is 138 copies/mL. A negative result does not preclude SARS-Cov-2 infection and should not be used as the sole basis for treatment or other patient management decisions. A negative result may occur with  improper specimen collection/handling, submission of specimen other than nasopharyngeal swab, presence of viral mutation(s) within  the areas targeted by this assay, and inadequate number of viral copies(<138 copies/mL). A negative result must be combined with clinical observations, patient history, and epidemiological information. The expected result is Negative.  Fact Sheet for Patients:  EntrepreneurPulse.com.au  Fact Sheet for Healthcare Providers:  IncredibleEmployment.be  This test is no t yet approved or cleared by the [place] FDA and  has been authorized for detection and/or diagnosis of SARS-CoV-2 by FDA under an Emergency Use Authorization (EUA). This EUA will  remain  in effect (meaning this test can be used) for the duration of the COVID-19 declaration under Section 564(b)(1) of the Act, 21 U.S.C.section 360bbb-3(b)(1), unless the authorization is terminated  or revoked sooner.       Influenza A by PCR NEGATIVE NEGATIVE Final   Influenza B by PCR NEGATIVE NEGATIVE Final    Comment: (NOTE) The Xpert Xpress SARS-CoV-2/FLU/RSV plus assay is intended as an aid in the diagnosis of influenza from Nasopharyngeal swab specimens and should not be used as a sole basis for treatment. Nasal washings and aspirates are unacceptable for Xpert Xpress SARS-CoV-2/FLU/RSV testing.  Fact Sheet for Patients: EntrepreneurPulse.com.au  Fact Sheet for Healthcare Providers: IncredibleEmployment.be  This test is not yet approved or cleared by the [place] FDA and has been authorized for detection and/or diagnosis of SARS-CoV-2 by FDA under an Emergency Use Authorization (EUA). This EUA will remain in effect (meaning this test can be used) for the duration of the COVID-19 declaration under Section 564(b)(1) of the Act, 21 U.S.C. section 360bbb-3(b)(1), unless the authorization is terminated or revoked.  Performed at [facility], [place], [place] [address]   Respiratory (~20 pathogens) panel by PCR     Status: None   Collection Time: 05/31/21  6:58 AM   Specimen: Nasopharyngeal Swab; Respiratory  Result Value Ref Range Status   Adenovirus NOT DETECTED NOT DETECTED Final   Coronavirus 229E NOT DETECTED NOT DETECTED Final    Comment: (NOTE) The Coronavirus on the Respiratory Panel, DOES NOT test for the novel  Coronavirus (2019 nCoV)    Coronavirus HKU1 NOT DETECTED NOT DETECTED Final   Coronavirus NL63 NOT DETECTED NOT DETECTED Final   Coronavirus OC43 NOT DETECTED NOT DETECTED Final   Metapneumovirus NOT DETECTED NOT DETECTED Final   Rhinovirus / Enterovirus NOT DETECTED NOT DETECTED  Final   Influenza A NOT DETECTED NOT DETECTED Final   Influenza B NOT DETECTED NOT DETECTED Final   Parainfluenza Virus 1 NOT DETECTED NOT DETECTED Final   Parainfluenza Virus 2 NOT DETECTED NOT DETECTED Final   Parainfluenza Virus 3 NOT DETECTED NOT DETECTED Final   Parainfluenza Virus 4 NOT DETECTED NOT DETECTED Final   Respiratory Syncytial Virus NOT DETECTED NOT DETECTED Final   Bordetella pertussis NOT DETECTED NOT DETECTED Final   Bordetella Parapertussis NOT DETECTED NOT DETECTED Final   Chlamydophila pneumoniae NOT DETECTED NOT DETECTED Final   Mycoplasma pneumoniae NOT DETECTED NOT DETECTED Final    Comment: Performed at [facility], [place]. [address], [place], [place] [address]  Group A Strep by PCR [facility] Only)     Status: None   Collection Time: 05/31/21  8:35 AM   Specimen: Throat; Sterile Swab  Result Value Ref Range Status   Group A Strep by PCR NOT DETECTED NOT DETECTED Final    Comment: Performed at [facility], [address] [place], [place] [address]  Blood culture (routine x 2)     Status: None (Preliminary result)  Collection Time: 05/31/21  8:39 AM   Specimen: BLOOD  Result Value Ref Range Status   Specimen Description BLOOD BLOOD RIGHT FOREARM  Final   Special Requests   Final    BOTTLES DRAWN AEROBIC AND ANAEROBIC Blood Culture results may not be optimal due to an excessive volume of blood received in culture bottles   Culture   Final    NO GROWTH < 24 HOURS Performed at [facility], [address] [place], [place] [address]    Report Status PENDING  Incomplete  Blood culture (routine x 2)     Status: None (Preliminary result)   Collection Time: 05/31/21  8:39 AM   Specimen: BLOOD  Result Value Ref Range Status   Specimen Description BLOOD RIGHT ANTECUBITAL  Final   Special Requests   Final    BOTTLES DRAWN AEROBIC AND ANAEROBIC Blood Culture results may not be optimal due to an excessive volume of blood received in culture  bottles   Culture   Final    NO GROWTH < 24 HOURS Performed at [facility], [address] [place], [place] [address]    Report Status PENDING  Incomplete  C Difficile Quick Screen w PCR reflex     Status: None   Collection Time: 05/31/21  1:20 PM   Specimen: Stool  Result Value Ref Range Status   C Diff antigen NEGATIVE NEGATIVE Final   C Diff toxin NEGATIVE NEGATIVE Final   C Diff interpretation No C. difficile detected.  Final    Comment: Performed at [facility], [address] [place], [place] [address]  Gastrointestinal Panel by PCR , Stool     Status: None   Collection Time: 05/31/21  1:30 PM   Specimen: Stool  Result Value Ref Range Status   Campylobacter species NOT DETECTED NOT DETECTED Final   Plesimonas shigelloides NOT DETECTED NOT DETECTED Final   Salmonella species NOT DETECTED NOT DETECTED Final   Yersinia enterocolitica NOT DETECTED NOT DETECTED Final   Vibrio species NOT DETECTED NOT DETECTED Final   Vibrio cholerae NOT DETECTED NOT DETECTED Final   Enteroaggregative E coli (EAEC) NOT DETECTED NOT DETECTED Final   Enteropathogenic E coli (EPEC) NOT DETECTED NOT DETECTED Final   Enterotoxigenic E coli (ETEC) NOT DETECTED NOT DETECTED Final   Shiga like toxin producing E coli (STEC) NOT DETECTED NOT DETECTED Final   Shigella/Enteroinvasive E coli (EIEC) NOT DETECTED NOT DETECTED Final   Cryptosporidium NOT DETECTED NOT DETECTED Final   Cyclospora cayetanensis NOT DETECTED NOT DETECTED Final   Entamoeba histolytica NOT DETECTED NOT DETECTED Final   Giardia lamblia NOT DETECTED NOT DETECTED Final   Adenovirus F40/41 NOT DETECTED NOT DETECTED Final   Astrovirus NOT DETECTED NOT DETECTED Final   Norovirus GI/GII NOT DETECTED NOT DETECTED Final   Rotavirus A NOT DETECTED NOT DETECTED Final   Sapovirus (I, II, IV, and V) NOT DETECTED NOT DETECTED Final    Comment: Performed at [facility], [address] [place], [place] [address]     Coagulation Studies: Recent Labs    05/31/21 0835  LABPROT 15.8*  INR 1.3*     Urinalysis: Recent Labs    05/31/21 0927  COLORURINE YELLOW*  LABSPEC 1.012  PHURINE 6.0  GLUCOSEU 50*  HGBUR MODERATE*  BILIRUBINUR NEGATIVE  KETONESUR NEGATIVE  PROTEINUR >=300*  NITRITE NEGATIVE  LEUKOCYTESUR NEGATIVE       Imaging: DG Chest 2 View  Result Date: 05/31/2021 CLINICAL DATA:  Cough.  Fever. EXAM: CHEST - 2 VIEW COMPARISON:  Multiple chest XRs, most recently 03/08/2020. CT chest, 01/02/2020. FINDINGS: Cardiomediastinal silhouette is within normal limits given positioning and technique. Lungs are well inflated. No focal consolidation or mass. No pleural effusion or pneumothorax. No acute displaced fracture. IMPRESSION: No active cardiopulmonary disease. Electronically Signed   By: Michaelle Birks M.D.   On: 05/31/2021 07:34   CT CHEST WO CONTRAST  Result Date: 05/31/2021 CLINICAL DATA:  Body aches, chills, nausea since Sunday. EXAM: CT CHEST WITHOUT CONTRAST TECHNIQUE: Multidetector CT imaging of the chest was performed following the standard protocol without IV contrast. RADIATION DOSE REDUCTION: This exam was performed according to the departmental dose-optimization program which includes automated exposure control, adjustment of the mA and/or kV according to patient size and/or use of iterative reconstruction technique. COMPARISON:  Same day chest radiograph, CT chest 01/02/2020 FINDINGS: Cardiovascular: The heart is not enlarged. There is trace pericardial fluid. The blood pool is hypodense suggesting anemia. The vasculature is unremarkable, as can be assessed in the absence of intravenous contrast. Mediastinum/Nodes: The thyroid is unremarkable. The esophagus is grossly unremarkable. There is no mediastinal or axillary lymphadenopathy. There is no bulky hilar adenopathy, suboptimally evaluated in the absence of intravenous contrast. Lungs/Pleura: The trachea and central airways are  patent. The lungs are well inflated. There is no focal consolidation or pulmonary edema. There is no pleural effusion or pneumothorax. Upper Abdomen: The imaged portions of the upper abdominal viscera are unremarkable. Musculoskeletal: Multiple remote right-sided rib fractures are seen. There is no acute osseous abnormality or aggressive osseous lesion. Other: There is mild diffuse body wall edema. IMPRESSION: 1. Trace pericardial effusion and mild diffuse body wall edema, new since 01/02/2020. 2. Clear lungs, with no focal consolidation or pleural effusion. 3. Hypodense blood pool consistent with anemia. Electronically Signed   By: Valetta Mole M.D.   On: 05/31/2021 12:08   US RENAL  Result Date: 05/31/2021 CLINICAL DATA:  Acute kidney injury EXAM: RENAL / URINARY TRACT ULTRASOUND COMPLETE COMPARISON:  06/10/2020 FINDINGS: Right Kidney: Renal measurements: 10.9 x 6.1 x 7.0 cm = volume: 244 mL. Increased cortical echogenicity with medical renal disease. Trace retroperitoneal perinephric edema. No hydronephrosis focal abnormality. Left Kidney: Renal measurements: 11.1 x 6.9 x 6.5 cm = volume: 257 mL. Similar increased cortical echogenicity compatible with medical renal disease. Similar trace retroperitoneal perinephric fluid/edema. No hydronephrosis. No focal abnormality. Bladder: Nearly collapsed, accounting for wall prominence Other: None. IMPRESSION: Increased renal echogenicity compatible with medical renal disease. Retroperitoneal perinephric edema suspect related to volume overload. Electronically Signed   By: Jerilynn Mages.  Shick M.D.   On: 05/31/2021 14:01     Medications:    cefTRIAXone (ROCEPHIN)  IV 1 g (06/01/21 0910)   dextrose 30 mL/hr at 06/01/21 0903   fentaNYL infusion INTRAVENOUS     propofol (DIPRIVAN) infusion      sodium bicarbonate (isotonic) infusion in sterile water 100 mL/hr at 06/01/21 0717    acetaminophen  650 mg Oral Once   diphenhydrAMINE  25 mg Oral Once   docusate  100 mg Per  Tube BID   glucagon (human recombinant)       glucose       glucose  1 tablet Oral Once   glucose  1 tablet Oral Once   hydrOXYzine  10 mg Oral Once   insulin aspart  0-9 Units Subcutaneous TID WC   midazolam       midazolam  2 mg Intravenous Once   pantoprazole (PROTONIX)  IV  40 mg Intravenous Daily   polyethylene glycol  17 g Per Tube Daily   sodium bicarbonate       acetaminophen **OR** acetaminophen, fentaNYL, guaiFENesin-dextromethorphan, midazolam, midazolam, ondansetron **OR** ondansetron (ZOFRAN) IV, oxyCODONE  Assessment/ Plan:  Mr. IVA POSTEN is a 26 y.o. black male with hypertension, insulin dependent diabetes mellitus type I, diabetic gastroparesis, diabetic neuropathy, tobacco use, THC use, right toe amputation, perirectal abscess, who is admitted to [facility] on 05/31/2021 for SIRS (systemic inflammatory response syndrome) ([place]) [R65.10] AKI (acute kidney injury) ([place]) [N17.9] Symptomatic anemia [D64.9] Acute renal failure superimposed on stage 5 chronic kidney disease, not on chronic dialysis, unspecified acute renal failure type ([place]) [N17.9, N18.5] Hypertension, unspecified type [I10] Acute cough [R05.1]  Acute kidney injury on chronic kidney disease stage IV versus progression of chronic kidney disease to end stage renal disease. Baseline creatinine of 4.13, GFR of 19 on 08/26/20. History of nephrotic range proteinuria and hematuria. No history of renal biopsy. Strong family history of dialysis with mother with ESRD before passing.  - will discuss with [facility] - Little improvement with labs overnight, clinical signs of increased uremia (drowsiness, confusion, weakness) Voiced our concerns to patient and the desire to initiate dialysis. Patient agreeable.  - Will contact vascular to place HD temp cath and plan to initiate renal replacement therapy afterwards.   Metablic acidosis: acute. Secondary to renal failure.  - Continue Sodium bicarb infusion  Hypocalcemia:  suspect secondary to renal failure and secondary hyperparathyroidism - Corrected 7.7 - hypocalcemia work up.   Anemia of chronic kidney disease and anemia of iron deficiency. Patient will need GI work up. Recommend IV iron if infection free.   Hypertension with chronic kidney disease: 132/74. Currently on hydralazine.   Hyponatremia: secondary to renal failure. IVF as above.   7. Hypoglycemic: glucose 57 with am labs. Treated with juice and glucose tab. Will add Dextrose to IVF to maintain levels  Code Blue called on patient later this morning with CPR initiated. Heart rhythm regained and patient transferred to intensive care unit. Plan to initiate CRRT today.   LOS: 1   1/19/202311:12 AM

## 2021-06-01 NOTE — Progress Notes (Signed)
Initial Nutrition Assessment  DOCUMENTATION CODES:   Not applicable  INTERVENTION:   RD will monitor and provide nutrition support when appropriate.   Pt at high refeed risk  NUTRITION DIAGNOSIS:   Inadequate oral intake related to inability to eat (pt sedated and ventilated) as evidenced by NPO status.  GOAL:   Provide needs based on ASPEN/SCCM guidelines  MONITOR:   Vent status, Labs, Weight trends, TF tolerance, Skin, I & O's  REASON FOR ASSESSMENT:   Malnutrition Screening Tool, Ventilator    ASSESSMENT:   26 y.o. male with medical history significant for type 1 diabetes mellitus, stage IV chronic kidney disease, hypertension and nicotine dependence who presents to the emergency room for evaluation of a 3-day history of fever, chills, nonproductive cough, myalgias and diarrhea. Pt found to have SIRS, AKI and anemia.  Pt s/p PEA arrest this morning. Unable to assess pt today as medical staff if trying to resuscitate him at this time. No enteral access noted. Pt is not stable for nutrition support. RD will follow and provide nutrition support when appropriate. Per chart, pt is down 15lbs(9%) over the past year; RD unsure how recently weight loss occurred.   Medications reviewed and include: colace, insulin, glucose, protonix, miralax, Na bicarbonate, ceftriaxone, 10% dextrose @30ml /hr, propofol   Labs reviewed: Na 128(L), BUN 95(H), creat 13.14(H), P 5.6(H) Hgb 5.9(L), Hct 17.4(L) Cbgs- 88, 85, 80, 86, 76, 62 x 24 hrs AIC 5.7(H)  Patient is currently intubated on ventilator support MV: 24.0 L/min Temp (24hrs), Avg:98.5 F (36.9 C), Min:97.4 F (36.3 C), Max:100.1 F (37.8 C)  MAP- >20mmg/Hg   NUTRITION - FOCUSED PHYSICAL EXAM: Unable to perform at this time   Diet Order:   Diet Order             Diet NPO time specified  Diet effective now                  EDUCATION NEEDS:   No education needs have been identified at this time  Skin:  Skin  Assessment: Reviewed RN Assessment (L and R heel wounds)  Last BM:  1/19- TYPE 7  Height:   Ht Readings from Last 1 Encounters:  05/31/21 5\' 8"  (1.727 m)    Weight:   Wt Readings from Last 1 Encounters:  05/31/21 65.8 kg    Ideal Body Weight:  70 kg  BMI:  Body mass index is 22.05 kg/m.  Estimated Nutritional Needs:   Kcal:  2272kcal/day  Protein:  100-115g/day  Fluid:  2.0-2.3L/day  Koleen Distance MS, RD, LDN Please refer to [facility] for RD and/or RD on-call/weekend/after hours pager

## 2021-06-01 NOTE — Procedures (Signed)
Arterial Catheter Insertion Procedure Note Jonathan Mcmillan [id number] 1995/11/10  Procedure: Insertion of Arterial Catheter  Indications: Blood pressure monitoring and Frequent blood sampling  Procedure Details Consent: Risks of procedure as well as the alternatives and risks of each were explained to the (patient/caregiver).  Consent for procedure obtained. Time Out: Verified patient identification, verified procedure, site/side was marked, verified correct patient position, special equipment/implants available, medications/allergies/relevent history reviewed, required imaging and test results available.  Performed  Maximum sterile technique was used including antiseptics, cap, gloves, gown, hand hygiene, mask, and sheet. Skin prep: Chlorhexidine; local anesthetic administered 20 gauge catheter was inserted into left femoral artery using the Seldinger technique.  Evaluation Blood flow good; BP tracing good. Complications: No apparent complications.  BIOPATCH applied to the insertion site.      Darel Hong, AGACNP-BC [facility] Prefer epic messenger for cross cover needs If after hours, please call E-link  Bradly Bienenstock 06/01/2021

## 2021-06-01 NOTE — Progress Notes (Signed)
Pt was transported to CT and back to CCU while on the vent. °

## 2021-06-01 NOTE — Progress Notes (Signed)
Chaplain Maggie responded to code blue to 2C. Support to pt's Aunt who is a Social worker with [employer]. Support also offered to numerous staff present at code and in the midst of transfer to ICU 1. Continued support available per on call chaplain.

## 2021-06-01 NOTE — Consult Note (Signed)
NAME:  Jonathan Mcmillan, MRN:  [id number], DOB:  December 29, 1995, LOS: 1 ADMISSION DATE:  05/31/2021, CONSULTATION DATE:  06/01/2021 REFERRING MD:  Dr. Leslye Peer, CHIEF COMPLAINT:  Cardiac Arrest   Brief Pt Description / Synopsis:  26 y.o. admitted with AKI on CKD in setting of viral syndrome and diarrhea.  Suffered in-hospital cardiac arrest (initial rhythm asystole), suspect due to severe metabolic acidosis and multiple metabolic derangements.  History of Present Illness:  Jonathan Mcmillan is a 26 year old male with a past medical history significant for diabetes mellitus type 1, chronic kidney disease stage IV, hypertension, nicotine dependence who presented to [facility] ED on 05/31/2021 due to 3-day history of fever, chills, nonproductive cough, myalgias, and diarrhea. Patient denied urinary frequency, nocturia, dysuria, abdominal pain, hematemesis, chest pain, shortness of breath, edema.  ED Course: Initial vital signs: Temperature 102.6, RR 22, HR 104, BP 139/70, SpO2 97% on room air Significant labs: Sodium 131, potassium 4.4, chloride 103, bicarb 15, glucose 177, BUN 19, creatinine 13.86 (baseline of 3.92), calcium 6.7, alkaline phosphatase 70, lipase 29, AST 24, ALT 33, lactic acid 2.1, WBC 12.7, hemoglobin 5.2 (baseline of 8.3) hematocrit 15.8, MCV 89.8, RDW 12.8, platelets 172, PT 15.8, INR 1.3. COVID-19 and influenza PCR negative Respiratory viral panel negative Urinalysis with proteinuria Imaging: Chest x-ray negative for acute cardiopulmonary disease CT Chest w/o contrast>>1. Trace pericardial effusion and mild diffuse body wall edema, new since 01/02/2020. 2. Clear lungs, with no focal consolidation or pleural effusion. 3. Hypodense blood pool consistent with anemia. Renal US>>Increased renal echogenicity compatible with medical renal disease. Retroperitoneal perinephric edema suspect related to volume overload.  Patient was admitted by the hospitalist for further work-up and treatment of  acute kidney injury on CKD.  Nephrology was consulted.  Interval History Early in the morning on 06/01/2021 rapid response was called when he was found in the shower on a chair passed out. Found to be  hypotensive, hypoglycemic, and lethargic with his "eyes rolling in the back of his head."  Patient was given 500 cc fluid bolus and ordered for blood transfusion.  However CODE BLUE was called as pt was found to be in asystole (progressed to PEA).  He received epinephrine x2, sodium bicarbonate x2, calcium x1, glucose and insulin.  Pulse was regained and he was transferred to ICU.  Upon arrival to the ICU patient patient is critically ill and concern for decorticate posturing.  PCCM consulted for further management.  Pertinent  Medical History  Chronic Kidney Disease Stage IV Diabetes Mellitus 1  Micro Data:  05/31/2021: SARS-CoV-2 and influenza PCR>> negative 05/31/2021: Respiratory viral panel>> negative 05/31/2021: Group A strep PCR>> not detected 05/31/2021: Blood culture x2>> no growth to date 05/31/2021: GI panel>> negative 05/31/2021: C. Difficile>> negative 06/01/2021: Tracheal aspirate>> 06/01/2021: Urine>> 06/01/2021: Blood culture>>  Antimicrobials:  Ceftriaxone 1/19 x 1 dose Vancomycin 1/19>> Zosyn 1/19>>  Significant Hospital Events: Including procedures, antibiotic start and stop dates in addition to other pertinent events   1/18: Admitted by the hospitalist.  Nephrology consulted, plans for vascular to place temporary dialysis catheter with initiation of dialysis 1/19: In-hospital cardiac arrest.  Critically ill. Transferred to ICU.  Plan for CRRT.  Central line, A line, and Trialysis catheter placed.  Concern for possible anoxic brain injury.  Sepsis workup in progress  Interim History / Subjective:  -This morning suffered in-hospital cardiac arrest (initial rhythm asystole then PEA) -Received epinephrine x2, sodium bicarbonate x3, calcium x1, glucose and insulin -Suspect  cardiac arrest due to severe  metabolic acidosis and multiple metabolic derangements -Discussed with nephrology, plan to initiate CRRT ~temporary dialysis catheter placed along with central line and A-line due to hemodynamic instability -Patient's aunts updated at bedside  Objective   Blood pressure 108/60, pulse 80, temperature (!) 94.1 F (34.5 C), temperature source Axillary, resp. rate 18, height 5' 8"  (1.727 m), weight 65.8 kg, SpO2 99 %.    Vent Mode: PCV FiO2 (%):  [35 %-100 %] 35 % Set Rate:  [16 bmp-18 bmp] 16 bmp Vt Set:  [450 mL] 450 mL PEEP:  [5 cmH20] 5 cmH20   Intake/Output Summary (Last 24 hours) at 06/01/2021 1326 Last data filed at 06/01/2021 1200 Gross per 24 hour  Intake 1604.78 ml  Output --  Net 1604.78 ml   Filed Weights   05/31/21 0637  Weight: 65.8 kg    Examination: General: Critically ill-appearing male, laying in bed, intubated, unresponsive, no acute distress HENT: Atraumatic, normocephalic, neck supple, no JVD Lungs: Coarse breath sounds bilaterally, overbreathing the ventilator, even Cardiovascular: Tachycardia, regular rhythm, S1-S2, no murmurs, rubs, gallops Abdomen: Soft, nontender, nondistended, no guarding rebound tenderness, bowel sounds positive x4 Extremities: Normal bulk and tone, no deformities, no edema Neuro: Unresponsive, appears to have decorticate posturing, does not withdraw from pain, Pupils PERRLA  GU: Foley catheter being placed  Resolved Hospital Problem list     Assessment & Plan:   In-Hospital Cardiac Arrest (initial rhythm asystole), suspect in setting of severe metabolic acidosis and multiple metabolic derangements Multifactorial Shock: Hypovolemic/Hemorrhagic +/- Cardiogenic +/- Septic Elevated Troponin, suspect demand ischemia PMHx: Hypertension -Continuous cardiac monitoring -Serial EKG's -Maintain MAP >65 -IV fluids -Transfusions as indicated -Vasopressors as needed to maintain MAP goal -Stress dose  steroids -Trend lactic acid until normalized -Trend HS Troponin until peaked -Echocardiogram pending -Urine drug screen + for Cannabinoid -Sepsis workup on progress -Cardiology consulted, appreciate input  Acute Hypoxic Respiratory Failure in setting of Cardiac Arrest ? Aspiration -Full vent support, implement lung protective strategies -Plateau pressures less than 30 cm H20 -Wean FiO2 & PEEP as tolerated to maintain O2 sats >92% -Follow intermittent Chest X-ray & ABG as needed -Spontaneous Breathing Trials when respiratory parameters met and mental status permits -Implement VAP Bundle -Prn Bronchodilators -ABX as above  Meets SIRS Criteria ? Aspiration with cardiac arrest Admitted with suspected viral syndrome and diarrhea -Monitor fever curve -Trend WBC's & Procalcitonin -Follow cultures as above -Start empiric Vancomycin & Zosyn pending cultures & sensitivities  Anemia of Chronic Disease Thrombocytopenia -Monitor for S/Sx of bleeding -Trend CBC -SCD's for VTE Prophylaxis  -Transfuse for Hgb <7 -Transfuse platelets for platelet count <50K with active bleeding -Received 2 units prbc's 1/19  AKI on CKD Stage IV Severe Anion Gap Metabolic Acidosis in setting of AKI & Lactic Acidosis Hyponatremia Hypocalcemia -Monitor I&O's / urinary output -Follow BMP -Ensure adequate renal perfusion -Avoid nephrotoxic agents as able -Replace electrolytes as indicated -Given 3 amps Bicarb and Bicarb gtt -Nephrology following, appreciate input ~ plan for CRRT ~ temporary dialysis catheter placed after arrival to ICU  Concern for possible anoxic brain injury following cardiac arrest Sedation needs in the setting of mechanical ventilation -Maintain a RASS goal of -1 to -2 -Fentanyl and Propofol as needed to maintain RASS goal -Avoid sedating medications as able -Daily wake up assessment -Normothermia protocol -Obtain CT Head and EEG -Neurology consulted, appreciate  input  Elevated LFT's, suspect developing shock liver following cardiac arrest -Follow LFT's and bleeding times -Obtain viral Hepatitis panel  Hypoglycemia PMHx: DM type I -  CBG's q4h; Target range of 140 to 180 -D10 infusion -Follow ICU Hypo/Hyperglycemia protocol   Pt is critically ill with multiorgan failure, concern for possible anoxic brain injury.  Prognosis is guarded, high risk for further cardiac arrest and death.  Recommend DNR status.  Pt's 2 aunt's updated at bedside.    Best Practice (right click and "Reselect all SmartList Selections" daily)   Diet/type: NPO DVT prophylaxis: SCD (holding chemical prophylaxis due to anemia and high risk of bleeding with developing liver failure GI prophylaxis: PPI Lines: Central line and yes and it is still needed Foley:  Yes, and it is still needed Code Status:  full code Last date of multidisciplinary goals of care discussion [N/A]  Pt's aunts updated at bedside 06/01/21  Labs   CBC: Recent Labs  Lab 05/31/21 0835 06/01/21 0404  WBC 12.7* 4.5  NEUTROABS 11.6*  --   HGB 5.2* 5.9*  HCT 15.8* 17.4*  MCV 89.8 85.7  PLT 172 124*    Basic Metabolic Panel: Recent Labs  Lab 05/31/21 0835 06/01/21 0404  NA 131* 128*  K 4.4 4.3  CL 102 101  CO2 15* 14*  GLUCOSE 177* 53*  BUN 90* 95*  CREATININE 13.86* 13.14*  CALCIUM 6.7* 6.3*  PHOS  --  5.6*   GFR: Estimated Creatinine Clearance: 7.9 mL/min (A) (by C-G formula based on SCr of 13.14 mg/dL (H)). Recent Labs  Lab 05/31/21 0835 05/31/21 1013 06/01/21 0404  PROCALCITON 1.10  --   --   WBC 12.7*  --  4.5  LATICACIDVEN 2.1* 1.2  --     Liver Function Tests: Recent Labs  Lab 05/31/21 0835 06/01/21 0404  AST 24  --   ALT 33  --   ALKPHOS 70  --   BILITOT 0.6  --   PROT 6.3*  --   ALBUMIN 2.9* 2.2*   Recent Labs  Lab 05/31/21 0835  LIPASE 29   No results for input(s): AMMONIA in the last 168 hours.  ABG    Component Value Date/Time   PHART 7.13  (LL) 06/01/2021 1000   PCO2ART 24 (L) 06/01/2021 1000   PO2ART 445 (H) 06/01/2021 1000   HCO3 8.0 (L) 06/01/2021 1000   TCO2 22 06/24/2007 0045   ACIDBASEDEF 19.4 (H) 06/01/2021 1000   O2SAT 100.0 06/01/2021 1000     Coagulation Profile: Recent Labs  Lab 05/31/21 0835  INR 1.3*    Cardiac Enzymes: Recent Labs  Lab 05/31/21 1114  CKTOTAL 411*    HbA1C: Hemoglobin-A1c  Date/Time Value Ref Range Status  06/26/2007 06:40 AM   Final   17.6% HIGH (NOTE) Reference Intervals:  Diabetic Adult        <9.0 Healthy Adult         3.9 - 7.3 Current ADA guidelines recommend a treatment goal of <7.0% HgbA1c for diabetic patients, which corresponds to a <9.0% Glycohemoglobin result with this method.   Performed at:  [facility]               [place], [place]  [address]   Hemoglobin A1C  Date/Time Value Ref Range Status  08/05/2013 01:26 PM 12.9 (H) 4.2 - 6.3 % Final    Comment:    The American Diabetes Association recommends that a primary goal of therapy should be <7% and that physicians should reevaluate the treatment regimen in patients with HbA1c values consistently >8%.  Hgb A1c MFr Bld  Date/Time Value Ref Range Status  05/31/2021 12:05 PM 5.7 (H) 4.8 - 5.6 % Final    Comment:    RESULTS CONFIRMED BY MANUAL DILUTION REPEATED TO VERIFY (NOTE) Pre diabetes:          5.7%-6.4%  Diabetes:              >6.4%  Glycemic control for   <7.0% adults with diabetes   06/11/2020 03:41 AM 7.0 (H) 4.8 - 5.6 % Final    Comment:    (NOTE) Pre diabetes:          5.7%-6.4%  Diabetes:              >6.4%  Glycemic control for   <7.0% adults with diabetes     CBG: Recent Labs  Lab 06/01/21 0807 06/01/21 1001 06/01/21 1048 06/01/21 1209 06/01/21 1252  GLUCAP 80 85 88 43* 120*    Review of Systems:   Unable to assess due to intubation, AMS, critical illness   Past Medical History:  He,  has a past medical history of Cannabinoid  hyperemesis syndrome, Diabetes 1.5, managed as type 1 ([place]), Gastroparesis, and Perirectal abscess (06/16/2017).   Surgical History:   Past Surgical History:  Procedure Laterality Date   INCISION AND DRAINAGE PERIRECTAL ABSCESS N/A 06/16/2017   Procedure: IRRIGATION AND DEBRIDEMENT PERIRECTAL ABSCESS;  Surgeon: Florene Glen, MD;  Location: [facility] ORS;  Service: General;  Laterality: N/A;   none       Social History:   reports that he has been smoking cigarettes. He has been smoking an average of .5 packs per day. He has never used smokeless tobacco. He reports current drug use. Drug: Marijuana. He reports that he does not drink alcohol.   Family History:  His family history includes Diabetes in his mother.   Allergies No Known Allergies   Home Medications  Prior to Admission medications   Medication Sig Start Date End Date Taking? Authorizing Provider  ondansetron (ZOFRAN) 4 MG tablet Take 1 tablet (4 mg total) by mouth every 8 (eight) hours as needed for up to 10 doses for nausea or vomiting. 05/31/21  Yes Lucrezia Starch, MD  amLODipine (NORVASC) 10 MG tablet Take 1 tablet (10 mg total) by mouth daily. Patient not taking: Reported on 06/01/2021 06/14/20   Fritzi Mandes, MD  hydrALAZINE (APRESOLINE) 50 MG tablet Take 1 tablet (50 mg total) by mouth 2 (two) times daily. Patient not taking: Reported on 06/01/2021 06/13/20   Fritzi Mandes, MD  insulin glargine (LANTUS) 100 UNIT/ML injection Inject 20 Units into the skin at bedtime. Patient not taking: Reported on 06/01/2021 08/26/20   [provider]  omeprazole (PRILOSEC) 40 MG capsule Take 1 capsule (40 mg total) by mouth daily. 03/08/20 05/07/20  Arta Silence, MD  pantoprazole (PROTONIX) 40 MG tablet Take 1 tablet (40 mg total) by mouth daily. Patient not taking: Reported on 12/10/2018 09/10/18 01/02/20  Hillary Bow, MD  sucralfate (CARAFATE) 1 g tablet Take 1 tablet (1 g total) by mouth 4 (four) times daily. Patient not  taking: Reported on 06/26/2019 02/04/19 01/02/20  Nance Pear, MD     Critical care time: 60 minutes     Darel Hong, AGACNP-BC [facility] Prefer epic messenger for cross cover needs If after hours, please call E-link

## 2021-06-01 NOTE — Procedures (Signed)
Central Venous Catheter Insertion Procedure Note Jonathan Mcmillan [id number] [birth year]  Procedure: Insertion of Central Venous Catheter Indications: Assessment of intravascular volume, Drug and/or fluid administration, and Frequent blood sampling  Procedure Details Consent: Risks of procedure as well as the alternatives and risks of each were explained to the (patient/caregiver).  Consent for procedure obtained. Time Out: Verified patient identification, verified procedure, site/side was marked, verified correct patient position, special equipment/implants available, medications/allergies/relevent history reviewed, required imaging and test results available.  Performed  Maximum sterile technique was used including antiseptics, cap, gloves, gown, hand hygiene, mask, and sheet. Skin prep: Chlorhexidine; local anesthetic administered A antimicrobial bonded/coated triple lumen catheter was placed in the left femoral vein due to multiple attempts, no other available access using the Seldinger technique.  Evaluation Blood flow good Complications: No apparent complications Patient did tolerate procedure well. Chest X-ray ordered to verify placement.  CXR:  not needed for femoral placement  Line secured at the 20 cm mark. BIOPATCH applied to the insertion site.      Jonathan Mcmillan, AGACNP-BC [facility] Prefer epic messenger for cross cover needs If after hours, please call E-link   Jonathan Mcmillan 06/01/2021, 1:29 PM

## 2021-06-01 NOTE — Progress Notes (Signed)
Patients glucose was 63 protocol placed for 4oz of juice. Patient still 64 so glucose tab 4 gm was given and glucose was then 62 so another glucose tab 4gm was given to result in glucose being 76 at 624 this morning.

## 2021-06-01 NOTE — Consult Note (Signed)
Jonathan Mcmillan is a 26 y.o. male  [id number]  Primary Cardiologist: Neoma Laming, MD Reason for Consultation: hospital arrest  HPI: Jonathan Mcmillan is a 26 y.o. male with medical history including diabetes, CKD, HTN, nicotine dependence admitted on 05/31/2021 with AKI on CKD, anemia, and fevers/chills/myalgias . Patient suffered cardiac arrest on the morning of 1/19 and was ultimately intubated and transferred to the ICU.   Review of Systems: patient currently sedated and intubated   Past Medical History:  Diagnosis Date   Cannabinoid hyperemesis syndrome    Diabetes 1.5, managed as type 1 ([place])    Gastroparesis    Perirectal abscess 06/16/2017    Medications Prior to Admission  Medication Sig Dispense Refill   amLODipine (NORVASC) 10 MG tablet Take 1 tablet (10 mg total) by mouth daily. (Patient not taking: Reported on 06/01/2021) 10 tablet 0   hydrALAZINE (APRESOLINE) 50 MG tablet Take 1 tablet (50 mg total) by mouth 2 (two) times daily. (Patient not taking: Reported on 06/01/2021) 60 tablet 2   insulin glargine (LANTUS) 100 UNIT/ML injection Inject 20 Units into the skin at bedtime. (Patient not taking: Reported on 06/01/2021)     omeprazole (PRILOSEC) 40 MG capsule Take 1 capsule (40 mg total) by mouth daily. 30 capsule 1      acetaminophen  650 mg Oral Q4H   Or   acetaminophen (TYLENOL) oral liquid 160 mg/5 mL  650 mg Per Tube Q4H   Or   acetaminophen  650 mg Rectal Q4H   busPIRone  30 mg Oral Q8H   Or   busPIRone  30 mg Per Tube Q8H   Chlorhexidine Gluconate Cloth  6 each Topical Daily   Chlorhexidine Gluconate Cloth  6 each Topical Daily   Chlorhexidine Gluconate Cloth  6 each Topical Daily   docusate  100 mg Per Tube BID   glucagon (human recombinant)       glucose       hydrocortisone sod succinate (SOLU-CORTEF) inj  100 mg Intravenous Q12H   insulin aspart  0-9 Units Subcutaneous TID WC   midazolam       pantoprazole (PROTONIX) IV  40 mg Intravenous Daily    polyethylene glycol  17 g Per Tube Daily   sodium bicarbonate  150 mEq Intravenous Once    Infusions:  dextrose 30 mL/hr at 06/01/21 0903   fentaNYL infusion INTRAVENOUS 50 mcg/hr (06/01/21 1437)   norepinephrine     norepinephrine (LEVOPHED) Adult infusion 5 mcg/min (06/01/21 1234)   piperacillin-tazobactam     piperacillin-tazobactam     prismasol BGK 2/2.5 dialysis solution     prismasol BGK 2/2.5 replacement solution     prismasol BGK 2/2.5 replacement solution     propofol (DIPRIVAN) infusion 25 mcg/kg/min (06/01/21 1235)    sodium bicarbonate (isotonic) infusion in sterile water 100 mL/hr at 06/01/21 0717   vancomycin     [START ON 06/02/2021] vancomycin      No Known Allergies  Social History   Socioeconomic History   Marital status: Single    Spouse name: Not on file   Number of children: Not on file   Years of education: Not on file   Highest education level: Not on file  Occupational History   Occupation: unemployed  Tobacco Use   Smoking status: Every Day    Packs/day: 0.50    Types: Cigarettes   Smokeless tobacco: Never  Vaping Use   Vaping Use: Never used  Substance and Sexual Activity  Alcohol use: No   Drug use: Yes    Types: Marijuana   Sexual activity: Not on file  Other Topics Concern   Not on file  Social History Narrative   Not on file   Social Determinants of Health   Financial Resource Strain: Not on file  Food Insecurity: Not on file  Transportation Needs: Not on file  Physical Activity: Not on file  Stress: Not on file  Social Connections: Not on file  Intimate Partner Violence: Not on file    Family History  Problem Relation Age of Onset   Diabetes Mother     PHYSICAL EXAM: Vitals:   06/01/21 1415 06/01/21 1500  BP: (!) 172/94 (!) 91/55  Pulse: 95 78  Resp: 13 (!) 29  Temp:    SpO2: 100% 100%     Intake/Output Summary (Last 24 hours) at 06/01/2021 1623 Last data filed at 06/01/2021 1408 Gross per 24 hour  Intake  1589.78 ml  Output --  Net 1589.78 ml    General:  Well appearing. No respiratory difficulty HEENT: normal Neck: supple. no JVD. Carotids 2+ bilat; no bruits. No lymphadenopathy or thryomegaly appreciated. Cor: PMI nondisplaced. Regular rate & rhythm. No rubs, gallops or murmurs. Lungs: clear Abdomen: soft, nontender, nondistended. No hepatosplenomegaly. No bruits or masses. Good bowel sounds. Extremities: no cyanosis, clubbing, rash, edema Neuro: alert & oriented x 3, cranial nerves grossly intact. moves all 4 extremities w/o difficulty. Affect pleasant.  ECG: sinus tachycardia, HR 115, non-specific ST changes  Results for orders placed or performed during the hospital encounter of 05/31/21 (from the past 24 hour(s))  Glucose, capillary     Status: Abnormal   Collection Time: 05/31/21  4:38 PM  Result Value Ref Range   Glucose-Capillary 114 (H) 70 - 99 mg/dL   Comment 1 Notify RN   Transfusion reaction     Status: None (Preliminary result)   Collection Time: 05/31/21  4:42 PM  Result Value Ref Range   Post RXN DAT IgG NEG    DAT C3 NEG    Path interp tx rxn      NO LABORATORY EVIDENCE OF HEMOLYTIC TRANSFUSION REACTION. OK TO CONTINUE WITH MORE TRANSFUSIONS. REVIEWED BY DR Claudette Laws 05/31/21 [facility] Performed at [facility], [address] [place], [place] [address]   Glucose, capillary     Status: None   Collection Time: 05/31/21  9:15 PM  Result Value Ref Range   Glucose-Capillary 98 70 - 99 mg/dL  Hepatitis B surface antibody,qualitative     Status: None   Collection Time: 06/01/21  4:04 AM  Result Value Ref Range   Hep B S Ab NON REACTIVE NON REACTIVE  Phosphorus     Status: Abnormal   Collection Time: 06/01/21  4:04 AM  Result Value Ref Range   Phosphorus 5.6 (H) 2.5 - 4.6 mg/dL  Basic metabolic panel     Status: Abnormal   Collection Time: 06/01/21  4:04 AM  Result Value Ref Range   Sodium 128 (L) 135 - 145 mmol/L   Potassium 4.3 3.5 - 5.1 mmol/L    Chloride 101 98 - 111 mmol/L   CO2 14 (L) 22 - 32 mmol/L   Glucose, Bld 53 (L) 70 - 99 mg/dL   BUN 95 (H) 6 - 20 mg/dL   Creatinine, Ser 13.14 (H) 0.61 - 1.24 mg/dL   Calcium 6.3 (LL) 8.9 - 10.3 mg/dL   GFR, Estimated 5 (L) >60 mL/min   Anion gap 13  5 - 15  CBC     Status: Abnormal   Collection Time: 06/01/21  4:04 AM  Result Value Ref Range   WBC 4.5 4.0 - 10.5 K/uL   RBC 2.03 (L) 4.22 - 5.81 MIL/uL   Hemoglobin 5.9 (L) 13.0 - 17.0 g/dL   HCT 17.4 (L) 39.0 - 52.0 %   MCV 85.7 80.0 - 100.0 fL   MCH 29.1 26.0 - 34.0 pg   MCHC 33.9 30.0 - 36.0 g/dL   RDW 12.8 11.5 - 15.5 %   Platelets 124 (L) 150 - 400 K/uL   nRBC 0.0 0.0 - 0.2 %  Albumin     Status: Abnormal   Collection Time: 06/01/21  4:04 AM  Result Value Ref Range   Albumin 2.2 (L) 3.5 - 5.0 g/dL  Glucose, capillary     Status: Abnormal   Collection Time: 06/01/21  5:19 AM  Result Value Ref Range   Glucose-Capillary 63 (L) 70 - 99 mg/dL   Comment 1 Notify RN   Glucose, capillary     Status: Abnormal   Collection Time: 06/01/21  5:38 AM  Result Value Ref Range   Glucose-Capillary 64 (L) 70 - 99 mg/dL   Comment 1 Notify RN   Glucose, capillary     Status: Abnormal   Collection Time: 06/01/21  5:58 AM  Result Value Ref Range   Glucose-Capillary 62 (L) 70 - 99 mg/dL  Glucose, capillary     Status: None   Collection Time: 06/01/21  6:24 AM  Result Value Ref Range   Glucose-Capillary 76 70 - 99 mg/dL  Glucose, capillary     Status: None   Collection Time: 06/01/21  7:00 AM  Result Value Ref Range   Glucose-Capillary 86 70 - 99 mg/dL  Prepare RBC (crossmatch)     Status: None   Collection Time: 06/01/21  7:38 AM  Result Value Ref Range   Order Confirmation      ORDER PROCESSED BY BLOOD BANK Performed at [facility], [address] [place], [place] [address]   Glucose, capillary     Status: None   Collection Time: 06/01/21  8:07 AM  Result Value Ref Range   Glucose-Capillary 80 70 - 99 mg/dL  Blood  gas, arterial     Status: Abnormal   Collection Time: 06/01/21 10:00 AM  Result Value Ref Range   FIO2 1.00    Delivery systems VENTILATOR    Mode PRESSURE REGULATED VOLUME CONTROL    VT 450 mL   LHR 18 resp/min   Peep/cpap 5.0 cm H20   pH, Arterial 7.13 (LL) 7.350 - 7.450   pCO2 arterial 24 (L) 32.0 - 48.0 mmHg   pO2, Arterial 445 (H) 83.0 - 108.0 mmHg   Bicarbonate 8.0 (L) 20.0 - 28.0 mmol/L   Acid-base deficit 19.4 (H) 0.0 - 2.0 mmol/L   O2 Saturation 100.0 %   Patient temperature 37.0    Collection site RIGHT RADIAL    Sample type ARTERIAL DRAW    Allens test (pass/fail) PASS PASS  Glucose, capillary     Status: None   Collection Time: 06/01/21 10:01 AM  Result Value Ref Range   Glucose-Capillary 85 70 - 99 mg/dL  Urine Drug Screen, Qualitative ([facility] only)     Status: Abnormal   Collection Time: 06/01/21 10:23 AM  Result Value Ref Range   Tricyclic, Ur Screen NONE DETECTED NONE DETECTED   Amphetamines, Ur Screen NONE DETECTED NONE DETECTED   MDMA (Ecstasy)Ur  Screen NONE DETECTED NONE DETECTED   Cocaine Metabolite,Ur [place] NONE DETECTED NONE DETECTED   Opiate, Ur Screen NONE DETECTED NONE DETECTED   Phencyclidine (PCP) Ur S NONE DETECTED NONE DETECTED   Cannabinoid 50 Ng, Ur [place] POSITIVE (A) NONE DETECTED   Barbiturates, Ur Screen NONE DETECTED NONE DETECTED   Benzodiazepine, Ur Scrn NONE DETECTED NONE DETECTED   Methadone Scn, Ur NONE DETECTED NONE DETECTED  Urinalysis, Complete w Microscopic     Status: Abnormal   Collection Time: 06/01/21 10:23 AM  Result Value Ref Range   Color, Urine Lilyian Quayle (A) YELLOW   APPearance TURBID (A) CLEAR   Specific Gravity, Urine 1.017 1.005 - 1.030   pH 6.0 5.0 - 8.0   Glucose, UA 50 (A) NEGATIVE mg/dL   Hgb urine dipstick MODERATE (A) NEGATIVE   Bilirubin Urine NEGATIVE NEGATIVE   Ketones, ur NEGATIVE NEGATIVE mg/dL   Protein, ur >=300 (A) NEGATIVE mg/dL   Nitrite NEGATIVE NEGATIVE   Leukocytes,Ua LARGE (A) NEGATIVE   RBC / HPF >50 (H)  0 - 5 RBC/hpf   WBC, UA >50 (H) 0 - 5 WBC/hpf   Bacteria, UA NONE SEEN NONE SEEN   Squamous Epithelial / LPF 0-5 0 - 5   Sperm, UA PRESENT   Glucose, capillary     Status: None   Collection Time: 06/01/21 10:48 AM  Result Value Ref Range   Glucose-Capillary 88 70 - 99 mg/dL  Glucose, capillary     Status: Abnormal   Collection Time: 06/01/21 12:09 PM  Result Value Ref Range   Glucose-Capillary 43 (LL) 70 - 99 mg/dL  Glucose, capillary     Status: Abnormal   Collection Time: 06/01/21 12:52 PM  Result Value Ref Range   Glucose-Capillary 120 (H) 70 - 99 mg/dL  CBC     Status: Abnormal   Collection Time: 06/01/21  1:25 PM  Result Value Ref Range   WBC 4.9 4.0 - 10.5 K/uL   RBC 2.50 (L) 4.22 - 5.81 MIL/uL   Hemoglobin 7.7 (L) 13.0 - 17.0 g/dL   HCT 21.9 (L) 39.0 - 52.0 %   MCV 87.6 80.0 - 100.0 fL   MCH 30.8 26.0 - 34.0 pg   MCHC 35.2 30.0 - 36.0 g/dL   RDW 13.2 11.5 - 15.5 %   Platelets 100 (L) 150 - 400 K/uL   nRBC 0.0 0.0 - 0.2 %  Comprehensive metabolic panel     Status: Abnormal   Collection Time: 06/01/21  1:25 PM  Result Value Ref Range   Sodium 131 (L) 135 - 145 mmol/L   Potassium 3.6 3.5 - 5.1 mmol/L   Chloride 98 98 - 111 mmol/L   CO2 11 (L) 22 - 32 mmol/L   Glucose, Bld 104 (H) 70 - 99 mg/dL   BUN 88 (H) 6 - 20 mg/dL   Creatinine, Ser 12.78 (H) 0.61 - 1.24 mg/dL   Calcium 6.5 (L) 8.9 - 10.3 mg/dL   Total Protein 4.7 (L) 6.5 - 8.1 g/dL   Albumin 2.0 (L) 3.5 - 5.0 g/dL   AST 101 (H) 15 - 41 U/L   ALT 78 (H) 0 - 44 U/L   Alkaline Phosphatase 68 38 - 126 U/L   Total Bilirubin 1.1 0.3 - 1.2 mg/dL   GFR, Estimated 5 (L) >60 mL/min   Anion gap 22 (H) 5 - 15  Procalcitonin - Baseline     Status: None   Collection Time: 06/01/21  1:25 PM  Result Value Ref  Range   Procalcitonin 108.03 ng/mL  Troponin I (High Sensitivity)     Status: Abnormal   Collection Time: 06/01/21  1:25 PM  Result Value Ref Range   Troponin I (High Sensitivity) 267 (HH) <18 ng/L  Lactic acid,  plasma     Status: Abnormal   Collection Time: 06/01/21  1:25 PM  Result Value Ref Range   Lactic Acid, Venous 8.3 (HH) 0.5 - 1.9 mmol/L  Glucose, capillary     Status: None   Collection Time: 06/01/21  3:00 PM  Result Value Ref Range   Glucose-Capillary 79 70 - 99 mg/dL  Glucose, capillary     Status: None   Collection Time: 06/01/21  3:37 PM  Result Value Ref Range   Glucose-Capillary 75 70 - 99 mg/dL   DG Chest 2 View  Result Date: 05/31/2021 CLINICAL DATA:  Cough.  Fever. EXAM: CHEST - 2 VIEW COMPARISON:  Multiple chest XRs, most recently 03/08/2020. CT chest, 01/02/2020. FINDINGS: Cardiomediastinal silhouette is within normal limits given positioning and technique. Lungs are well inflated. No focal consolidation or mass. No pleural effusion or pneumothorax. No acute displaced fracture. IMPRESSION: No active cardiopulmonary disease. Electronically Signed   By: Michaelle Birks M.D.   On: 05/31/2021 07:34   CT CHEST WO CONTRAST  Result Date: 05/31/2021 CLINICAL DATA:  Body aches, chills, nausea since Sunday. EXAM: CT CHEST WITHOUT CONTRAST TECHNIQUE: Multidetector CT imaging of the chest was performed following the standard protocol without IV contrast. RADIATION DOSE REDUCTION: This exam was performed according to the departmental dose-optimization program which includes automated exposure control, adjustment of the mA and/or kV according to patient size and/or use of iterative reconstruction technique. COMPARISON:  Same day chest radiograph, CT chest 01/02/2020 FINDINGS: Cardiovascular: The heart is not enlarged. There is trace pericardial fluid. The blood pool is hypodense suggesting anemia. The vasculature is unremarkable, as can be assessed in the absence of intravenous contrast. Mediastinum/Nodes: The thyroid is unremarkable. The esophagus is grossly unremarkable. There is no mediastinal or axillary lymphadenopathy. There is no bulky hilar adenopathy, suboptimally evaluated in the absence of  intravenous contrast. Lungs/Pleura: The trachea and central airways are patent. The lungs are well inflated. There is no focal consolidation or pulmonary edema. There is no pleural effusion or pneumothorax. Upper Abdomen: The imaged portions of the upper abdominal viscera are unremarkable. Musculoskeletal: Multiple remote right-sided rib fractures are seen. There is no acute osseous abnormality or aggressive osseous lesion. Other: There is mild diffuse body wall edema. IMPRESSION: 1. Trace pericardial effusion and mild diffuse body wall edema, new since 01/02/2020. 2. Clear lungs, with no focal consolidation or pleural effusion. 3. Hypodense blood pool consistent with anemia. Electronically Signed   By: Valetta Mole M.D.   On: 05/31/2021 12:08   US RENAL  Result Date: 05/31/2021 CLINICAL DATA:  Acute kidney injury EXAM: RENAL / URINARY TRACT ULTRASOUND COMPLETE COMPARISON:  06/10/2020 FINDINGS: Right Kidney: Renal measurements: 10.9 x 6.1 x 7.0 cm = volume: 244 mL. Increased cortical echogenicity with medical renal disease. Trace retroperitoneal perinephric edema. No hydronephrosis focal abnormality. Left Kidney: Renal measurements: 11.1 x 6.9 x 6.5 cm = volume: 257 mL. Similar increased cortical echogenicity compatible with medical renal disease. Similar trace retroperitoneal perinephric fluid/edema. No hydronephrosis. No focal abnormality. Bladder: Nearly collapsed, accounting for wall prominence Other: None. IMPRESSION: Increased renal echogenicity compatible with medical renal disease. Retroperitoneal perinephric edema suspect related to volume overload. Electronically Signed   By: Jerilynn Mages.  Shick M.D.   On: 05/31/2021  14:01   DG Chest Port 1 View  Result Date: 06/01/2021 CLINICAL DATA:  Intubation. EXAM: PORTABLE CHEST 1 VIEW COMPARISON:  Chest radiographs and CT 05/31/2021 FINDINGS: An endotracheal tube has been placed and terminates proximally 2.5 cm above the carina. Telemetry leads overlie the chest. The  cardiac silhouette is accentuated by portable AP technique although true mild interval enlargement, such as from a pericardial effusion, is not excluded. Lung volumes are lower than on the prior radiographs, and there are mild opacities in both lung bases. No sizable pleural effusion or pneumothorax is identified. IMPRESSION: 1. Endotracheal tube in appropriate position. 2. Low lung volumes with mild bibasilar opacities which may reflect atelectasis or infection. Electronically Signed   By: Logan Bores M.D.   On: 06/01/2021 13:56     ASSESSMENT AND PLAN: Patient cardiac arrest likely due to asystole secondary to metabolic disorder, Mildly elevated troponin levels due to demand ischemia. Echo results pending. Will look at echo results for any wall motion abnormalities to suggest coronary artery disease. Will continue to follow.   Engineer, drilling  FNP-C

## 2021-06-01 NOTE — Progress Notes (Signed)
*  PRELIMINARY RESULTS* Echocardiogram 2D Echocardiogram has been performed.  Jonathan Mcmillan [name] 06/01/2021, 2:19 PM

## 2021-06-01 NOTE — Significant Event (Signed)
Patient was found this morning in shower on chair passed out by an RN on the unit. This RN went into room and patient was communicating with staff and then started to pass out again. This RN called for NT to come to room to get him back to bed and called a rapid on patient because he was becoming lethargic eyes rolling back and started to mumble. Patient very weak at this point. Dr. Leslye Peer notified and orders placed for a 572ml bolus and to hold his BP medications.

## 2021-06-01 NOTE — ED Provider Notes (Signed)
I responded to CODE BLUE on the floor for this patient.  CPR in progress with hospitalist and ICU physician at the bedside.  Patient intubated by respiratory and I assist with providing ACLS per protocol.  ROSC achieved and patient transitioned to the ICU.   Vladimir Crofts, MD 06/01/21 1257

## 2021-06-01 NOTE — Progress Notes (Signed)
Patient c/o right flank pain, requesting Kpad. On-call B. Randol Kern, NP notified and Kpad ordered. Barbaraann Faster, RN 1:08 AM 06/01/2021

## 2021-06-01 NOTE — Consult Note (Signed)
Pharmacy Antibiotic Note  Jonathan Mcmillan is a 26 y.o. male with medical history including diabetes, CKD, HTN, nicotine dependence admitted on 05/31/2021 with  AKI on CKD, anemia, and fevers/chills/myalgias . Patient suffered cardiac arrest on the morning of 1/19 and was ultimately intubated and transferred to the ICU. Patient is pending initiation of CRRT per nephrology. Pharmacy has been consulted for vancomycin and Zosyn dosing.  Plan:  Zosyn 3.375 g IV q6h (30-minute infusion)  Vancomycin 1.25 g IV LD followed by 750 mg IV q24h  Height: 5\' 8"  (172.7 cm) Weight: 65.8 kg (145 lb) IBW/kg (Calculated) : 68.4  Temp (24hrs), Avg:96.7 F (35.9 C), Min:93.1 F (33.9 C), Max:100.1 F (37.8 C)  Recent Labs  Lab 05/31/21 0835 05/31/21 1013 06/01/21 0404 06/01/21 1325  WBC 12.7*  --  4.5 4.9  CREATININE 13.86*  --  13.14* 12.78*  LATICACIDVEN 2.1* 1.2  --  8.3*    Estimated Creatinine Clearance: 8.1 mL/min (A) (by C-G formula based on SCr of 12.78 mg/dL (H)).    No Known Allergies  Antimicrobials this admission: Ceftriaxone 1/19 x 1 Vancomycin 1/19 >>  Zosyn 1/19 >>   Dose adjustments this admission: N/A  Microbiology results: 1/18 C diff: (-) 1/18 GI panel: (-) 1/18 Expanded respiratory panel: (-) 1/19 BCx: pending 1/19 UCx: pending  1/19 Sputum: pending  1/19 MRSA PCR: pending  Thank you for allowing pharmacy to be a part of this patients care.  Benita Gutter 06/01/2021 3:19 PM

## 2021-06-01 NOTE — Progress Notes (Signed)
Patient ID: Jonathan Mcmillan, male   DOB: 1995-06-18, 26 y.o.   MRN: [id number]  Responded to code [place].  Initial reading on the monitor was asystole then PEA.  CPR was done.  Patient was intubated by respiratory.  Medications pushed were epinephrine x2, sodium bicarb x2, calcium x1, glucose and insulin.  Patient regained pulse.  The patient was transferred to the ICU.  Signout given to Dr. Merrilee Jansky ICU specialist.  Case discussed with aunt at the bedside and another aunt Tammy on the phone.  Patient remains in critical condition in the next 24 to 48 hours will be critical to see how he responds neurologically.  Dr Loletha Grayer

## 2021-06-01 NOTE — Progress Notes (Signed)
Patient ID: Jonathan Mcmillan, male   DOB: Jul 16, 1995, 26 y.o.   MRN: [id number] [facility] PROGRESS NOTE  Jonathan Mcmillan TWS:[id number] DOB: 28-Aug-1995 DOA: 05/31/2021 PCP: [facility] [place]  HPI/Subjective: Responded this morning to rapid response.  Patient had a low blood pressure.  Patient also had some low sugars and he thinks that was the reason why he was not feeling good.  Patient was also having worsening kidney function.  I spoke with the blood bank and no reaction to the blood transfusion last night.  I spoke with the nephrology team to evaluate him to start dialysis.  Objective: Vitals:   06/01/21 0317 06/01/21 0318  BP: 109/69 115/71  Pulse: (!) 112 (!) 111  Resp:    Temp:    SpO2: 100% 100%    Intake/Output Summary (Last 24 hours) at 06/01/2021 0730 Last data filed at 06/01/2021 0335 Gross per 24 hour  Intake 1352.28 ml  Output --  Net 1352.28 ml   Filed Weights   05/31/21 0637  Weight: 65.8 kg    ROS: Review of Systems  Respiratory:  Positive for shortness of breath.   Cardiovascular:  Positive for chest pain.  Gastrointestinal:  Negative for abdominal pain, nausea and vomiting.  Exam: Physical Exam HENT:     Head: Normocephalic.     Mouth/Throat:     Pharynx: No oropharyngeal exudate.  Eyes:     General: Lids are normal.     Conjunctiva/sclera: Conjunctivae normal.  Cardiovascular:     Rate and Rhythm: Normal rate and regular rhythm.     Heart sounds: Normal heart sounds, S1 normal and S2 normal.  Pulmonary:     Breath sounds: No decreased breath sounds, wheezing, rhonchi or rales.  Abdominal:     Palpations: Abdomen is soft.     Tenderness: There is no abdominal tenderness.  Musculoskeletal:     Right lower leg: No swelling.     Left lower leg: No swelling.  Skin:    General: Skin is warm.     Findings: No rash.  Neurological:     Mental Status: He is alert and oriented to person, place, and time.      Scheduled  Meds:  glucagon (human recombinant)       glucose       glucose  1 tablet Oral Once   glucose  1 tablet Oral Once   hydrOXYzine  10 mg Oral Once   insulin aspart  0-9 Units Subcutaneous TID WC   pantoprazole  40 mg Oral Daily   Continuous Infusions:  dextrose      sodium bicarbonate (isotonic) infusion in sterile water 100 mL/hr at 06/01/21 0717    Assessment/Plan:  Hypotension.  I ordered a fluid bolus.  I spoke with the blood bank and no blood transfusion reaction.  Ordered another unit of blood for his hemoglobin of 5.9. Acute kidney injury on chronic kidney disease stage IV.  Spoke with nephrology and they were going to initiate dialysis. Anemia of chronic disease.  Hemoglobin of 5.9.  Ordered for a blood transfusion. Type 2 diabetes mellitus hypoglycemia.  Ordered for D10 drip initially and dextrose. Fever myalgias.  COVID test negative.  Ordered empiric Rocephin.  Follow-up cultures.      Code Status:     Code Status Orders  (From admission, onward)           Start     Ordered   05/31/21 1123  Full code  Continuous  05/31/21 1124           Code Status History     Date Active Date Inactive Code Status Order ID Comments User Context   06/10/2020 1955 06/13/2020 1823 Full Code [id number]  Sidney Ace Arvella Merles, MD ED   09/09/2018 1215 09/10/2018 1711 Full Code [id number]  Dustin Flock, MD Inpatient   01/26/2018 1840 01/27/2018 1734 Full Code [id number]  Sela Hua, MD Inpatient   06/16/2017 1141 06/18/2017 1433 Full Code [id number]  Florene Glen, MD ED   03/06/2017 1004 03/08/2017 1913 Full Code [id number]  Harrie Foreman, MD Inpatient   05/21/2016 0804 05/24/2016 1751 Full Code [id number]  Saundra Shelling, MD ED      Disposition Plan: Status is: Inpatient Case discussed with nephrology  Loletha Grayer  [facility]

## 2021-06-01 NOTE — Procedures (Signed)
Hemodialysis Catheter Insertion Procedure Note Jonathan Mcmillan [id number] 03-Apr-1996  Procedure: Insertion of Hemodialysis Catheter Indications: Hemodialysis  Procedure Details Consent: Risks of procedure as well as the alternatives and risks of each were explained to the (patient/caregiver).  Consent for procedure obtained.  Time Out: Verified patient identification, verified procedure, site/side was marked, verified correct patient position, special equipment/implants available, medications/allergies/relevent history reviewed, required imaging and test results available.  Performed  Maximum sterile technique was used including antiseptics, cap, gloves, gown, hand hygiene, mask, and sheet.  Skin prep: Chlorhexidine; local anesthetic administered  A Trialysis HD catheter was placed in the right femoral vein due to patient being a dialysis patient and emergent situation using the Seldinger technique.  Evaluation Blood flow good Complications: No apparent complications Patient did tolerate procedure well. Chest X-ray ordered to verify placement.  CXR:  Not needed for femoral placement .   Procedure performed under direct supervision of Dr. Merrilee Jansky and with ultrasound guidance for real time vessel cannulation.      Line secured at the 30 cm mark. BIOPATCH applied to the insertion site.    Darel Hong, AGACNP-BC [facility] Prefer epic messenger for cross cover needs If after hours, please call E-link  06/01/2021, 1:31 PM

## 2021-06-02 ENCOUNTER — Inpatient Hospital Stay: Payer: Medicaid Other

## 2021-06-02 LAB — TRIGLYCERIDES: Triglycerides: 253 mg/dL — ABNORMAL HIGH (ref <150)

## 2021-06-02 LAB — GLUCOSE, CAPILLARY
Glucose-Capillary: 71 mg/dL (ref 70–99)
Glucose-Capillary: 65 mg/dL — ABNORMAL LOW (ref 70–99)
Glucose-Capillary: 95 mg/dL (ref 70–99)
Glucose-Capillary: 70 mg/dL (ref 70–99)
Glucose-Capillary: 81 mg/dL (ref 70–99)
Glucose-Capillary: 65 mg/dL — ABNORMAL LOW (ref 70–99)
Glucose-Capillary: 67 mg/dL — ABNORMAL LOW (ref 70–99)
Glucose-Capillary: 84 mg/dL (ref 70–99)

## 2021-06-02 LAB — TYPE AND SCREEN
ABO/RH(D): B POS
Antibody Screen: NEGATIVE
Unit division: 0
Unit division: 0
Unit division: 0

## 2021-06-02 LAB — PROTEIN ELECTROPHORESIS, SERUM
A/G Ratio: 0.9 (ref 0.7–1.7)
Albumin ELP: 2.3 g/dL — ABNORMAL LOW (ref 2.9–4.4)
Alpha-1-Globulin: 0.3 g/dL (ref 0.0–0.4)
Alpha-2-Globulin: 0.6 g/dL (ref 0.4–1.0)
Beta Globulin: 0.5 g/dL — ABNORMAL LOW (ref 0.7–1.3)
Gamma Globulin: 1.1 g/dL (ref 0.4–1.8)
Globulin, Total: 2.5 g/dL (ref 2.2–3.9)
Total Protein ELP: 4.8 g/dL — ABNORMAL LOW (ref 6.0–8.5)

## 2021-06-02 LAB — BPAM RBC
Blood Product Expiration Date: 202301262359
Blood Product Expiration Date: 202301302359
Blood Product Expiration Date: 202301312359
ISSUE DATE / TIME: 202301181338
ISSUE DATE / TIME: 202301191007
ISSUE DATE / TIME: 202301191157
Unit Type and Rh: 1700
Unit Type and Rh: 7300
Unit Type and Rh: 7300

## 2021-06-02 LAB — BLOOD GAS, ARTERIAL
Acid-base deficit: 5.1 mmol/L — ABNORMAL HIGH (ref 0.0–2.0)
Bicarbonate: 20.6 mmol/L (ref 20.0–28.0)
FIO2: 0.35
MECHVT: 450 mL
Mechanical Rate: 16
O2 Saturation: 97.9 %
Patient temperature: 37
RATE: 16 resp/min
pCO2 arterial: 40 mmHg (ref 32.0–48.0)
pH, Arterial: 7.32 — ABNORMAL LOW (ref 7.350–7.450)
pO2, Arterial: 110 mmHg — ABNORMAL HIGH (ref 83.0–108.0)

## 2021-06-02 LAB — CBC
HCT: 25.4 % — ABNORMAL LOW (ref 39.0–52.0)
Hemoglobin: 9.2 g/dL — ABNORMAL LOW (ref 13.0–17.0)
MCH: 30.5 pg (ref 26.0–34.0)
MCHC: 36.2 g/dL — ABNORMAL HIGH (ref 30.0–36.0)
MCV: 84.1 fL (ref 80.0–100.0)
Platelets: 76 10*3/uL — ABNORMAL LOW (ref 150–400)
RBC: 3.02 MIL/uL — ABNORMAL LOW (ref 4.22–5.81)
RDW: 13.8 % (ref 11.5–15.5)
WBC: 8.6 10*3/uL (ref 4.0–10.5)
nRBC: 0 % (ref 0.0–0.2)

## 2021-06-02 LAB — KAPPA/LAMBDA LIGHT CHAINS
Kappa free light chain: 203.8 mg/L — ABNORMAL HIGH (ref 3.3–19.4)
Kappa, lambda light chain ratio: 1.99 — ABNORMAL HIGH (ref 0.26–1.65)
Lambda free light chains: 102.6 mg/L — ABNORMAL HIGH (ref 5.7–26.3)

## 2021-06-02 LAB — PROTEIN ELECTRO, RANDOM URINE
Albumin ELP, Urine: 53.2 %
Alpha-1-Globulin, U: 6.2 %
Alpha-2-Globulin, U: 7.9 %
Beta Globulin, U: 11.9 %
Gamma Globulin, U: 20.8 %
Total Protein, Urine: 437.2 mg/dL

## 2021-06-02 LAB — RENAL FUNCTION PANEL
Albumin: 2 g/dL — ABNORMAL LOW (ref 3.5–5.0)
Albumin: 2.1 g/dL — ABNORMAL LOW (ref 3.5–5.0)
Anion gap: 12 (ref 5–15)
Anion gap: 14 (ref 5–15)
BUN: 58 mg/dL — ABNORMAL HIGH (ref 6–20)
BUN: 38 mg/dL — ABNORMAL HIGH (ref 6–20)
CO2: 19 mmol/L — ABNORMAL LOW (ref 22–32)
CO2: 20 mmol/L — ABNORMAL LOW (ref 22–32)
Calcium: 6.6 mg/dL — ABNORMAL LOW (ref 8.9–10.3)
Calcium: 8 mg/dL — ABNORMAL LOW (ref 8.9–10.3)
Chloride: 99 mmol/L (ref 98–111)
Chloride: 99 mmol/L (ref 98–111)
Creatinine, Ser: 7.7 mg/dL — ABNORMAL HIGH (ref 0.61–1.24)
Creatinine, Ser: 4.69 mg/dL — ABNORMAL HIGH (ref 0.61–1.24)
GFR, Estimated: 9 mL/min — ABNORMAL LOW (ref >60)
GFR, Estimated: 17 mL/min — ABNORMAL LOW (ref >60)
Glucose, Bld: 95 mg/dL (ref 70–99)
Glucose, Bld: 96 mg/dL (ref 70–99)
Phosphorus: 5.8 mg/dL — ABNORMAL HIGH (ref 2.5–4.6)
Phosphorus: 5.1 mg/dL — ABNORMAL HIGH (ref 2.5–4.6)
Potassium: 3.1 mmol/L — ABNORMAL LOW (ref 3.5–5.1)
Potassium: 3.9 mmol/L (ref 3.5–5.1)
Sodium: 130 mmol/L — ABNORMAL LOW (ref 135–145)
Sodium: 133 mmol/L — ABNORMAL LOW (ref 135–145)

## 2021-06-02 LAB — MRSA NEXT GEN BY PCR, NASAL: MRSA by PCR Next Gen: NOT DETECTED

## 2021-06-02 LAB — LACTIC ACID, PLASMA
Lactic Acid, Venous: 4.7 mmol/L (ref 0.5–1.9)
Lactic Acid, Venous: 4.3 mmol/L (ref 0.5–1.9)

## 2021-06-02 LAB — PARATHYROID HORMONE, INTACT (NO CA): PTH: 115 pg/mL — ABNORMAL HIGH (ref 15–65)

## 2021-06-02 LAB — MAGNESIUM: Magnesium: 1.6 mg/dL — ABNORMAL LOW (ref 1.7–2.4)

## 2021-06-02 LAB — TROPONIN I (HIGH SENSITIVITY)
Troponin I (High Sensitivity): 374 ng/L (ref <18)
Troponin I (High Sensitivity): 386 ng/L (ref <18)
Troponin I (High Sensitivity): 347 ng/L (ref <18)

## 2021-06-02 LAB — PROCALCITONIN: Procalcitonin: 93.56 ng/mL

## 2021-06-02 LAB — ANTINUCLEAR ANTIBODIES, IFA: ANA Ab, IFA: NEGATIVE

## 2021-06-02 MED ORDER — JUVEN PO PACK
1.0000 | PACK | Freq: Two times a day (BID) | ORAL | Status: DC
  Administered 2021-06-02 – 2021-06-10 (×12): 1

## 2021-06-02 MED ORDER — RENA-VITE PO TABS
1.0000 | ORAL_TABLET | Freq: Every day | ORAL | Status: DC
  Administered 2021-06-02 – 2021-06-10 (×6): 1
  Filled 2021-06-02 (×8): qty 1

## 2021-06-02 MED ORDER — DEXTROSE 50 % IV SOLN
12.5000 g | INTRAVENOUS | Status: AC
  Administered 2021-06-02: 12.5 g via INTRAVENOUS

## 2021-06-02 MED ORDER — VITAL 1.5 CAL PO LIQD
1000.0000 mL | ORAL | Status: DC
  Administered 2021-06-02: 1000 mL

## 2021-06-02 MED ORDER — PROSOURCE TF PO LIQD
45.0000 mL | Freq: Three times a day (TID) | ORAL | Status: DC
  Administered 2021-06-02 – 2021-06-03 (×3): 45 mL

## 2021-06-02 MED ORDER — DEXMEDETOMIDINE HCL IN NACL 400 MCG/100ML IV SOLN
0.4000 ug/kg/h | INTRAVENOUS | Status: DC
  Administered 2021-06-02: 0.6 ug/kg/h via INTRAVENOUS
  Administered 2021-06-02: 0.4 ug/kg/h via INTRAVENOUS
  Administered 2021-06-03: 1 ug/kg/h via INTRAVENOUS
  Filled 2021-06-02 (×3): qty 100

## 2021-06-02 MED ORDER — DEXTROSE 50 % IV SOLN
INTRAVENOUS | Status: AC
  Filled 2021-06-02: qty 50

## 2021-06-02 MED ORDER — OXYCODONE HCL 5 MG PO TABS: 5.0000 mg | ORAL_TABLET | ORAL | Status: DC | PRN

## 2021-06-02 MED ORDER — MAGNESIUM SULFATE IN D5W 1-5 GM/100ML-% IV SOLN
1.0000 g | Freq: Once | INTRAVENOUS | Status: AC
  Administered 2021-06-02: 1 g via INTRAVENOUS
  Filled 2021-06-02: qty 100

## 2021-06-02 MED ORDER — FREE WATER
30.0000 mL | Status: DC
  Administered 2021-06-02 – 2021-06-03 (×6): 30 mL

## 2021-06-02 NOTE — Progress Notes (Signed)
OGT retracted 3 cm. No complications.

## 2021-06-02 NOTE — Progress Notes (Signed)
Per Nephrology keep pt on 2K 3.5 Ca bath. Unable to scan in [facility] due to not being in order set. RN changed pre, post, and dialysate prismasol bags approximately 0800.

## 2021-06-02 NOTE — Progress Notes (Signed)
SUBJECTIVE: Jonathan Mcmillan is a 26 y.o. male with medical history including diabetes, CKD, HTN, nicotine dependence admitted on 05/31/2021 with AKI on CKD, anemia, and fevers/chills/myalgias . Patient suffered cardiac arrest on the morning of 1/19 and was ultimately intubated and transferred to the ICU.  Patient awake, continues to be intubated.   Vitals:   06/02/21 0600 06/02/21 0700 06/02/21 0800 06/02/21 0809  BP: 125/67 (!) 98/58    Pulse: 100 91    Resp:      Temp: (!) 97 F (36.1 C) (!) 97.3 F (36.3 C) (!) 97.5 F (36.4 C)   TempSrc:  Rectal Rectal   SpO2: 100% 100% 99% 100%  Weight:      Height:        Intake/Output Summary (Last 24 hours) at 06/02/2021 0913 Last data filed at 06/02/2021 0900 Gross per 24 hour  Intake 3931.38 ml  Output 3141 ml  Net 790.38 ml    LABS: Basic Metabolic Panel: Recent Labs    06/01/21 1937 06/02/21 0357  NA 133* 130*  K 4.1 3.1*  CL 96* 99  CO2 15* 19*  GLUCOSE 65* 95  BUN 91* 58*  CREATININE 12.67* 7.70*  CALCIUM 6.6* 6.6*  MG  --  1.6*  PHOS 7.6* 5.8*   Liver Function Tests: Recent Labs    05/31/21 0835 06/01/21 0404 06/01/21 1325 06/01/21 1937 06/02/21 0357  AST 24  --  101*  --   --   ALT 33  --  78*  --   --   ALKPHOS 70  --  68  --   --   BILITOT 0.6  --  1.1  --   --   PROT 6.3*  --  4.7*  --   --   ALBUMIN 2.9*   < > 2.0* 2.1* 2.0*   < > = values in this interval not displayed.   Recent Labs    05/31/21 0835  LIPASE 29   CBC: Recent Labs    05/31/21 0835 06/01/21 0404 06/01/21 1325 06/02/21 0357  WBC 12.7*   < > 4.9 8.6  NEUTROABS 11.6*  --   --   --   HGB 5.2*   < > 7.7* 9.2*  HCT 15.8*   < > 21.9* 25.4*  MCV 89.8   < > 87.6 84.1  PLT 172   < > 100* 76*   < > = values in this interval not displayed.   Cardiac Enzymes: Recent Labs    05/31/21 1114  CKTOTAL 411*   BNP: Invalid input(s): POCBNP D-Dimer: No results for input(s): DDIMER in the last 72 hours. Hemoglobin A1C: Recent Labs     05/31/21 1205  HGBA1C 5.7*   Fasting Lipid Panel: Recent Labs    06/02/21 0357  TRIG 253*   Thyroid Function Tests: No results for input(s): TSH, T4TOTAL, T3FREE, THYROIDAB in the last 72 hours.  Invalid input(s): FREET3 Anemia Panel: Recent Labs    05/31/21 0844  FERRITIN 108  TIBC 195*  IRON 9*     PHYSICAL EXAM General: Well developed, well nourished HEENT:  Normocephalic and atramatic Neck:  No JVD.  Lungs: Clear bilaterally to auscultation and percussion. Heart: HRRR . Normal S1 and S2 without gallops or murmurs.  Abdomen: Bowel sounds are positive, abdomen soft and non-tender  Extremities: No clubbing, cyanosis or edema.   Neuro: Alert    TELEMETRY: sinus rhythm, HR 118 bpm  ASSESSMENT AND PLAN: Patient cardiac arrest likely due to asystole  secondary to metabolic disorder, Mildly elevated troponin levels due to demand ischemia.  Echo performed 06/01/21 revealed EF 35-40%. Mild LV dysfunction likely non-cardiac, age of patient most likely not CAD. Will need started on an ARB when blood pressure can tolerate it. Will continue to follow.  Principal Problem:   AKI (acute kidney injury) ([place]) Active Problems:   Hypertension   Acute kidney injury superimposed on CKD ([place])   Type 2 diabetes mellitus with hypoglycemia without coma, without long-term current use of insulin (HCC)   Nicotine dependence   Anemia of chronic disease   Hypotension   Fever   Cardiopulmonary arrest ([place])   Pressure injury of skin    Danyelle Brookover, FNP-C 06/02/2021 9:13 AM

## 2021-06-02 NOTE — Progress Notes (Signed)
°   05/31/21 1926  Assess: MEWS Score  Temp 100.1 F (37.8 C)  BP (!) 144/86  Pulse Rate (!) 117  Resp 19  SpO2 100 %  O2 Device Room Air  Assess: MEWS Score  MEWS Temp 0  MEWS Systolic 0  MEWS Pulse 2  MEWS RR 0  MEWS LOC 0  MEWS Score 2  MEWS Score Color Yellow  Assess: if the MEWS score is Yellow or Red  Were vital signs taken at a resting state? Yes  Focused Assessment No change from prior assessment  Does the patient meet 2 or more of the SIRS criteria? No  MEWS guidelines implemented *See Row Information* Yes  Treat  MEWS Interventions Escalated (See documentation below)  Take Vital Signs  Increase Vital Sign Frequency  Yellow: Q 2hr X 2 then Q 4hr X 2, if remains yellow, continue Q 4hrs  Escalate  MEWS: Escalate Yellow: discuss with charge nurse/RN and consider discussing with provider and RRT  Notify: Charge Nurse/RN  Name of Charge Nurse/RN Notified Frances Furbish RN  Date Charge Nurse/RN Notified 05/31/21  Time Charge Nurse/RN Notified 1941  Assess: SIRS CRITERIA  SIRS Temperature  0  SIRS Pulse 1  SIRS Respirations  0  SIRS WBC 0  SIRS Score Sum  1

## 2021-06-02 NOTE — Progress Notes (Signed)
Nutrition Follow Up Note   DOCUMENTATION CODES:   Not applicable  INTERVENTION:   Vital 1.5_0 /hr- Initiate at 16m/hr and increase by 160mhr q 8 hours until goal rate is reached.   Pro-Source 4542mID via tube, provides 40kcal and 11g of protein per serving   Free water flushes 66m72m hours to maintain tube patency   Regimen provides 1920kcal/day, 114g/day protein and 1097ml79m free water.   Rena- vit daily via tube   Juven Fruit Punch BID via tube, each serving provides 95kcal and 2.5g of protein (amino acids glutamine and arginine)  NUTRITION DIAGNOSIS:   Inadequate oral intake related to inability to eat (pt sedated and ventilated) as evidenced by NPO status.  GOAL:   Provide needs based on ASPEN/SCCM guidelines -not met   MONITOR:   Vent status, Labs, Weight trends, TF tolerance, Skin, I & O's  ASSESSMENT:   25 y.49 male with medical history significant for type 1 diabetes mellitus, stage IV chronic kidney disease, hypertension and nicotine dependence who presents to the emergency room for evaluation of a 3-day history of fever, chills, nonproductive cough, myalgias and diarrhea. Pt found to have SIRS, AKI and anemia.  Pt sedated and ventilated. OGT in place. Plan is to start tube feeds today. Pt at high refeed risk. Pt awake and following some commands. CRRT initiated yesterday. Pt on cooling protocol. It is difficult to determine if pt has had any true weight changes as documented weights vary significantly.   Medications reviewed and include: colace, solu-cortef, protonix, miralax, 10% dextrose _1 /hr, levophed, zosyn, vancomycin   Labs reviewed: Na 130(L), K 3.1(L), BUN 58(H), creat 7.70(H), P 5.8(H), Mg 1.6(L) Hgb 9.2(L), Hct 25.4(L) Cbgs- 71, 108, 49, 49, 75, 79 x 24 hrs  Patient is currently intubated on ventilator support MV: 9.0 L/min Temp (24hrs), Avg:95.9 F (35.5 C), Min:93.1 F (33.9 C), Max:97.9 F (36.6 C)  Propofol: none   NUTRITION -  FOCUSED PHYSICAL EXAM:  Flowsheet Row Most Recent Value  Orbital Region No depletion  Upper Arm Region Mild depletion  Thoracic and Lumbar Region Mild depletion  Buccal Region No depletion  Temple Region No depletion  Clavicle Bone Region No depletion  Clavicle and Acromion Bone Region No depletion  Scapular Bone Region No depletion  Dorsal Hand No depletion  Patellar Region Severe depletion  Anterior Thigh Region Severe depletion  Posterior Calf Region Severe depletion  Edema (RD Assessment) None  Hair Reviewed  Eyes Reviewed  Mouth Reviewed  Skin Reviewed  Nails Reviewed   Diet Order:   Diet Order             Diet NPO time specified  Diet effective now                  EDUCATION NEEDS:   No education needs have been identified at this time  Skin:  Skin Assessment: Reviewed RN Assessment (L and R heel wounds)  Last BM:  1/19- TYPE 7  Height:   Ht Readings from Last 1 Encounters:  06/02/21 5' 7.99" (1.727 m)    Weight:   Wt Readings from Last 1 Encounters:  06/02/21 75 kg    Ideal Body Weight:  70 kg  BMI:  Body mass index is 25.15 kg/m.  Estimated Nutritional Needs:   Kcal:  1812kcal/day  Protein:  100-115g/day  Fluid:  2.0-2.3L/day  CaseyKoleen DistanceRD, LDN Please refer to [facility] and/or RD on-call/weekend/after hours pager

## 2021-06-02 NOTE — Consult Note (Signed)
Pharmacy Antibiotic Note  Jonathan Mcmillan is a 26 y.o. male with medical history including diabetes, CKD, HTN, nicotine dependence admitted on 05/31/2021 with  AKI on CKD, anemia, and fevers/chills/myalgias . Patient suffered cardiac arrest on the morning of 1/19 and was ultimately intubated and transferred to the ICU. Patient is currently on CRRT per nephrology. Pharmacy has been consulted for vancomycin and Zosyn dosing.  Plan:  Zosyn 3.375 g IV q6h (30-minute infusion)  Vancomycin 750 mg IV q24h --Will consider levels periodically if therapy continued and patient remains on CRRT  Height: 5' 7.99" (172.7 cm) Weight: 75 kg (165 lb 5.5 oz) IBW/kg (Calculated) : 68.38  Temp (24hrs), Avg:95.8 F (35.4 C), Min:93.1 F (33.9 C), Max:97.7 F (36.5 C)  Recent Labs  Lab 05/31/21 0835 05/31/21 1013 06/01/21 0404 06/01/21 1325 06/01/21 1715 06/01/21 1937 06/02/21 0357  WBC 12.7*  --  4.5 4.9  --   --  8.6  CREATININE 13.86*  --  13.14* 12.78*  --  12.67* 7.70*  LATICACIDVEN 2.1* 1.2  --  8.3* 6.5*  --   --      Estimated Creatinine Clearance: 13.9 mL/min (A) (by C-G formula based on SCr of 7.7 mg/dL (H)).    No Known Allergies  Antimicrobials this admission: Ceftriaxone 1/19 x 1 Vancomycin 1/19 >>  Zosyn 1/19 >>   Dose adjustments this admission: N/A  Microbiology results: 1/18 C diff: (-) 1/18 GI panel: (-) 1/18 Expanded respiratory panel: (-) 1/19 BCx: NG two days 1/19 UCx: pending  1/19 Sputum: pending  1/19 MRSA PCR: pending 1/19 Tracheal Aspirate: gram positive cocci, gram positive rods, gram negative rods, yeast  Thank you for allowing pharmacy to be a part of this patients care.  Owens Loffler 06/02/2021 8:11 AM

## 2021-06-02 NOTE — Progress Notes (Signed)
NAME:  Jonathan Mcmillan, MRN:  [id number], DOB:  30-Apr-1996, LOS: 2 ADMISSION DATE:  05/31/2021, CONSULTATION DATE:  06/01/2021 REFERRING MD:  Dr. Leslye Peer, CHIEF COMPLAINT:  Cardiac Arrest   Brief Pt Description / Synopsis:  26 y.o. admitted with AKI on CKD in setting of viral syndrome and diarrhea.  Suffered in-hospital cardiac arrest (initial rhythm asystole), suspect due to severe metabolic acidosis and multiple metabolic derangements.  History of Present Illness:  Jonathan Mcmillan is a 26 year old male with a past medical history significant for diabetes mellitus type 1, chronic kidney disease stage IV, hypertension, nicotine dependence who presented to [facility] ED on 05/31/2021 due to 3-day history of fever, chills, nonproductive cough, myalgias, and diarrhea. Patient denied urinary frequency, nocturia, dysuria, abdominal pain, hematemesis, chest pain, shortness of breath, edema.  ED Course: Initial vital signs: Temperature 102.6, RR 22, HR 104, BP 139/70, SpO2 97% on room air Significant labs: Sodium 131, potassium 4.4, chloride 103, bicarb 15, glucose 177, BUN 19, creatinine 13.86 (baseline of 3.92), calcium 6.7, alkaline phosphatase 70, lipase 29, AST 24, ALT 33, lactic acid 2.1, WBC 12.7, hemoglobin 5.2 (baseline of 8.3) hematocrit 15.8, MCV 89.8, RDW 12.8, platelets 172, PT 15.8, INR 1.3. COVID-19 and influenza PCR negative Respiratory viral panel negative Urinalysis with proteinuria Imaging: Chest x-ray negative for acute cardiopulmonary disease CT Chest w/o contrast>>1. Trace pericardial effusion and mild diffuse body wall edema, new since 01/02/2020. 2. Clear lungs, with no focal consolidation or pleural effusion. 3. Hypodense blood pool consistent with anemia. Renal US>>Increased renal echogenicity compatible with medical renal disease. Retroperitoneal perinephric edema suspect related to volume overload.  Patient was admitted by the hospitalist for further work-up and treatment of  acute kidney injury on CKD.  Nephrology was consulted.  Interval History Early in the morning on 06/01/2021 rapid response was called when he was found in the shower on a chair passed out. Found to be  hypotensive, hypoglycemic, and lethargic with his "eyes rolling in the back of his head."  Patient was given 500 cc fluid bolus and ordered for blood transfusion.  However CODE BLUE was called as pt was found to be in asystole (progressed to PEA).  He received epinephrine x2, sodium bicarbonate x2, calcium x1, glucose and insulin.  Pulse was regained and he was transferred to ICU.  Upon arrival to the ICU patient patient is critically ill and concern for decorticate posturing.  PCCM consulted for further management.  Pertinent  Medical History  Chronic Kidney Disease Stage IV Diabetes Mellitus 1  Micro Data:  05/31/2021: SARS-CoV-2 and influenza PCR>> negative 05/31/2021: Respiratory viral panel>> negative 05/31/2021: Group A strep PCR>> not detected 05/31/2021: Blood culture x2>> no growth to date 05/31/2021: GI panel>> negative 05/31/2021: C. Difficile>> negative 06/01/2021: Tracheal aspirate>> gram + cocci, gram + rods, gram - rods,  06/01/2021: Urine>> 06/01/2021: Blood culture>>  Antimicrobials:  Ceftriaxone 1/19 x 1 dose Vancomycin 1/19>> Zosyn 1/19>>  Significant Hospital Events: Including procedures, antibiotic start and stop dates in addition to other pertinent events   1/18: Admitted by the hospitalist.  Nephrology consulted, plans for vascular to place temporary dialysis catheter with initiation of dialysis 1/19: In-hospital cardiac arrest.  Critically ill. Transferred to ICU.  Plan for CRRT.  Central line, A line, and Trialysis catheter placed.  Concern for possible anoxic brain injury.  Sepsis workup in progress 1/20: Pt is awake, tracking, following simple commands. Remains on CRRT, bicarb gtt d/c.  Weaning fiO2 and pressors. Tracheal aspirate growing gram + cocci,  gram + rods, gram  - rods  Interim History / Subjective:  -No significant events noted overnight -CT Head yesterday evening negative for acute intracranial abnormality -This morning pt is awake, tracking, and able to follow simple commands -Remains on CRRT, bicarb gtt d/c by Nephrology -Weaning FIO2 and pressors -Discussed with dr. Merrilee Jansky, will keep intubated today to continue to allow for correction of metabolic derangements -ECHO with LVEF 35-40% and global hypokinesis, grade II diastolic dysfunction, mildly reduced RV function, and moderately increased Pulmonary systolic pressure -Preliminary results from tracheal aspirate with gram + cocci, gram + rods, gram - rods ~ will continue Zosyn and Vanc for now pending further culture and sensitivities  Objective   Blood pressure (!) 98/58, pulse 91, temperature (!) 97.5 F (36.4 C), temperature source Rectal, resp. rate 16, height 5' 7.99" (1.727 m), weight 75 kg, SpO2 100 %.    Vent Mode: Spontaneous FiO2 (%):  [30 %-100 %] 30 % Set Rate:  [16 bmp-18 bmp] 16 bmp Vt Set:  [450 mL] 450 mL PEEP:  [5 cmH20] 5 cmH20 Pressure Support:  [5 cmH20-10 cmH20] 5 cmH20   Intake/Output Summary (Last 24 hours) at 06/02/2021 1610 Last data filed at 06/02/2021 0800 Gross per 24 hour  Intake 3744.18 ml  Output 2912 ml  Net 832.18 ml    Filed Weights   05/31/21 9604 06/02/21 0424  Weight: 65.8 kg 75 kg    Examination: General: Critically ill-appearing male, laying in bed, intubated, awake, no acute distress HENT: Atraumatic, normocephalic, neck supple, no JVD Lungs: Coarse breath sounds bilaterally, in PSV and tolerating, even Cardiovascular: Tachycardia, regular rhythm, S1-S2, no murmurs, rubs, gallops Abdomen: Soft, nontender, nondistended, no guarding rebound tenderness, bowel sounds positive x4 Extremities: Normal bulk and tone, no deformities, no edema Neuro: Awake, tracking, follows simple commands (generalized weakness), no focal deficits,  Pupils PERRLA   GU: Foley catheter in place with minimal urine output  Resolved Hospital Problem list     Assessment & Plan:   In-Hospital Cardiac Arrest (initial rhythm asystole), suspect in setting of severe metabolic acidosis and multiple metabolic derangements Multifactorial Shock: Hypovolemic/Hemorrhagic +/- Cardiogenic +/- Septic Elevated Troponin, suspect demand ischemia PMHx: Hypertension -Continuous cardiac monitoring -Serial EKG's -Maintain MAP >65 -Transfusions as indicated -Vasopressors as needed to maintain MAP goal -Stress dose steroids -Trend lactic acid until normalized (8.3 ~ 6.5 ~ 4.7 ~ ) -Trend HS Troponin until peaked (267 ~ 290 ~ 374 ~ ) -Echocardiogram 06/01/21>>LVEF 35-40% and global hypokinesis, grade II diastolic dysfunction, mildly reduced RV function, and moderately increased Pulmonary systolic pressure -Urine drug screen + for Cannabinoid -Sepsis workup on progress -Cardiology following, appreciate input  Acute Hypoxic Respiratory Failure in setting of Cardiac Arrest ? Aspiration -Full vent support, implement lung protective strategies -Plateau pressures less than 30 cm H20 -Wean FiO2 & PEEP as tolerated to maintain O2 sats >92% -Follow intermittent Chest X-ray & ABG as needed -Spontaneous Breathing Trials when respiratory parameters met and mental status permits -Implement VAP Bundle -Prn Bronchodilators -ABX as above  Meets SIRS Criteria ? Aspiration with cardiac arrest UTI Admitted with suspected viral syndrome and diarrhea -Monitor fever curve -Trend WBC's & Procalcitonin -Follow cultures as above -Continue empiric Vancomycin & Zosyn pending cultures & sensitivities  Anemia of Chronic Disease Thrombocytopenia -Monitor for S/Sx of bleeding -Trend CBC -SCD's for VTE Prophylaxis  -Transfuse for Hgb <7 -Transfuse platelets for platelet count <50K with active bleeding -Received 2 units prbc's 1/19  AKI on CKD Stage IV Severe Anion Gap  Metabolic  Acidosis in setting of AKI & Lactic Acidosis Hyponatremia Hypocalcemia -Monitor I&O's / urinary output -Follow BMP -Ensure adequate renal perfusion -Avoid nephrotoxic agents as able -Replace electrolytes as indicated -Nephrology following, appreciate input ~ Continues on CRRT  Initial concern for possible anoxic brain injury following cardiac arrest ~ now awake,tracking,and following commands Sedation needs in the setting of mechanical ventilation -Maintain a RASS goal of -1 to -2 -Fentanyl and versed as needed to maintain RASS goal -Avoid sedating medications as able -Daily wake up assessment -Normothermia protocol -CT Head negative for acute intracranial abnormality -Will cancel Neurology consult given improvement in Neuro exam  Elevated LFT's, suspect developing shock liver following cardiac arrest -Follow LFT's and bleeding times -Obtain viral Hepatitis panel  Hypoglycemia PMHx: DM type I -CBG's q4h; Target range of 140 to 180 -D10 infusion -Follow ICU Hypo/Hyperglycemia protocol   Pt is critically ill with multiorgan failure.  Prognosis is guarded, high risk for further cardiac arrest and death.  Recommend DNR status.  Pt's 2 aunt's updated at bedside.    Best Practice (right click and "Reselect all SmartList Selections" daily)   Diet/type: NPO, start tube feeds DVT prophylaxis: SCD (holding chemical prophylaxis due to anemia and high risk of bleeding with developing liver failure GI prophylaxis: PPI Lines: Central line and yes and it is still needed Foley:  Yes, and it is still needed Code Status:  full code Last date of multidisciplinary goals of care discussion [06/02/21]  Pt's aunts updated at bedside 06/02/21. All questions answered to their satisfaction.  Labs   CBC: Recent Labs  Lab 05/31/21 0835 06/01/21 0404 06/01/21 1325 06/02/21 0357  WBC 12.7* 4.5 4.9 8.6  NEUTROABS 11.6*  --   --   --   HGB 5.2* 5.9* 7.7* 9.2*  HCT 15.8* 17.4* 21.9* 25.4*   MCV 89.8 85.7 87.6 84.1  PLT 172 124* 100* 76*     Basic Metabolic Panel: Recent Labs  Lab 05/31/21 0835 06/01/21 0404 06/01/21 1325 06/01/21 1937 06/02/21 0357  NA 131* 128* 131* 133* 130*  K 4.4 4.3 3.6 4.1 3.1*  CL 102 101 98 96* 99  CO2 15* 14* 11* 15* 19*  GLUCOSE 177* 53* 104* 65* 95  BUN 90* 95* 88* 91* 58*  CREATININE 13.86* 13.14* 12.78* 12.67* 7.70*  CALCIUM 6.7* 6.3* 6.5* 6.6* 6.6*  MG  --   --   --   --  1.6*  PHOS  --  5.6*  --  7.6* 5.8*    GFR: Estimated Creatinine Clearance: 13.9 mL/min (A) (by C-G formula based on SCr of 7.7 mg/dL (H)). Recent Labs  Lab 05/31/21 0835 05/31/21 1013 06/01/21 0404 06/01/21 1325 06/01/21 1715 06/02/21 0357  PROCALCITON 1.10  --   --  108.03  --  93.56  WBC 12.7*  --  4.5 4.9  --  8.6  LATICACIDVEN 2.1* 1.2  --  8.3* 6.5*  --      Liver Function Tests: Recent Labs  Lab 05/31/21 0835 06/01/21 0404 06/01/21 1325 06/01/21 1937 06/02/21 0357  AST 24  --  101*  --   --   ALT 33  --  78*  --   --   ALKPHOS 70  --  68  --   --   BILITOT 0.6  --  1.1  --   --   PROT 6.3*  --  4.7*  --   --   ALBUMIN 2.9* 2.2* 2.0* 2.1* 2.0*    Recent Labs  Lab 05/31/21 0835  LIPASE 29    No results for input(s): AMMONIA in the last 168 hours.  ABG    Component Value Date/Time   PHART 7.32 (L) 06/02/2021 0723   PCO2ART 40 06/02/2021 0723   PO2ART 110 (H) 06/02/2021 0723   HCO3 20.6 06/02/2021 0723   TCO2 22 06/24/2007 0045   ACIDBASEDEF 5.1 (H) 06/02/2021 0723   O2SAT 97.9 06/02/2021 0723      Coagulation Profile: Recent Labs  Lab 05/31/21 0835 06/01/21 1715  INR 1.3* 2.3*     Cardiac Enzymes: Recent Labs  Lab 05/31/21 1114  CKTOTAL 411*     HbA1C: Hemoglobin-A1c  Date/Time Value Ref Range Status  06/26/2007 06:40 AM   Final   17.6% HIGH (NOTE) Reference Intervals:  Diabetic Adult        <9.0 Healthy Adult         3.9 - 7.3 Current ADA guidelines recommend a treatment goal of <7.0% HgbA1c for  diabetic patients, which corresponds to a <9.0% Glycohemoglobin result with this method.   Performed at:  [facility]               [place], [place]  [address]   Hemoglobin A1C  Date/Time Value Ref Range Status  08/05/2013 01:26 PM 12.9 (H) 4.2 - 6.3 % Final    Comment:    The American Diabetes Association recommends that a primary goal of therapy should be <7% and that physicians should reevaluate the treatment regimen in patients with HbA1c values consistently >8%.    Hgb A1c MFr Bld  Date/Time Value Ref Range Status  05/31/2021 12:05 PM 5.7 (H) 4.8 - 5.6 % Final    Comment:    RESULTS CONFIRMED BY MANUAL DILUTION REPEATED TO VERIFY (NOTE) Pre diabetes:          5.7%-6.4%  Diabetes:              >6.4%  Glycemic control for   <7.0% adults with diabetes   06/11/2020 03:41 AM 7.0 (H) 4.8 - 5.6 % Final    Comment:    (NOTE) Pre diabetes:          5.7%-6.4%  Diabetes:              >6.4%  Glycemic control for   <7.0% adults with diabetes     CBG: Recent Labs  Lab 06/01/21 1537 06/01/21 2255 06/01/21 2257 06/01/21 2331 06/02/21 0709  GLUCAP 75 49* 49* 108* 71     Review of Systems:   Unable to assess due to intubation, AMS, critical illness   Past Medical History:  He,  has a past medical history of Cannabinoid hyperemesis syndrome, Diabetes 1.5, managed as type 1 ([place]), Gastroparesis, and Perirectal abscess (06/16/2017).   Surgical History:   Past Surgical History:  Procedure Laterality Date   INCISION AND DRAINAGE PERIRECTAL ABSCESS N/A 06/16/2017   Procedure: IRRIGATION AND DEBRIDEMENT PERIRECTAL ABSCESS;  Surgeon: Florene Glen, MD;  Location: [facility] ORS;  Service: General;  Laterality: N/A;   none       Social History:   reports that he has been smoking cigarettes. He has been smoking an average of .5 packs per day. He has never used smokeless tobacco. He reports current drug use. Drug: Marijuana. He reports that he does  not drink alcohol.   Family History:  His family history includes Diabetes in his mother.  Allergies No Known Allergies   Home Medications  Prior to Admission medications   Medication Sig Start Date End Date Taking? Authorizing Provider  ondansetron (ZOFRAN) 4 MG tablet Take 1 tablet (4 mg total) by mouth every 8 (eight) hours as needed for up to 10 doses for nausea or vomiting. 05/31/21  Yes Lucrezia Starch, MD  amLODipine (NORVASC) 10 MG tablet Take 1 tablet (10 mg total) by mouth daily. Patient not taking: Reported on 06/01/2021 06/14/20   Fritzi Mandes, MD  hydrALAZINE (APRESOLINE) 50 MG tablet Take 1 tablet (50 mg total) by mouth 2 (two) times daily. Patient not taking: Reported on 06/01/2021 06/13/20   Fritzi Mandes, MD  insulin glargine (LANTUS) 100 UNIT/ML injection Inject 20 Units into the skin at bedtime. Patient not taking: Reported on 06/01/2021 08/26/20   [provider]  omeprazole (PRILOSEC) 40 MG capsule Take 1 capsule (40 mg total) by mouth daily. 03/08/20 05/07/20  Arta Silence, MD  pantoprazole (PROTONIX) 40 MG tablet Take 1 tablet (40 mg total) by mouth daily. Patient not taking: Reported on 12/10/2018 09/10/18 01/02/20  Hillary Bow, MD  sucralfate (CARAFATE) 1 g tablet Take 1 tablet (1 g total) by mouth 4 (four) times daily. Patient not taking: Reported on 06/26/2019 02/04/19 01/02/20  Nance Pear, MD     Critical care time: 40 minutes     Darel Hong, AGACNP-BC [facility] Prefer epic messenger for cross cover needs If after hours, please call E-link

## 2021-06-02 NOTE — Consult Note (Signed)
[place] for Electrolyte Monitoring and Replacement   Recent Labs: Potassium (mmol/L)  Date Value  06/02/2021 3.1 (L)  08/05/2013 3.8   Magnesium (mg/dL)  Date Value  06/02/2021 1.6 (L)   Calcium (mg/dL)  Date Value  06/02/2021 6.6 (L)   Calcium, Total (PTH) (mg/dL)  Date Value  06/12/2020 8.0   Albumin (g/dL)  Date Value  06/02/2021 2.0 (L)  08/05/2013 2.9 (L)   Phosphorus (mg/dL)  Date Value  06/02/2021 5.8 (H)   Sodium (mmol/L)  Date Value  06/02/2021 130 (L)  08/05/2013 133 (L)   Assessment: Jonathan Mcmillan is a 26 y.o. male with medical history including diabetes, CKD, HTN, nicotine dependence admitted on 05/31/2021 with  AKI on CKD, anemia, and fevers/chills/myalgias . Patient suffered cardiac arrest on the morning of 1/19 and was ultimately intubated and transferred to the ICU. Patient is currently on CRRT per nephrology. Pharmacy has been consulted to monitor and replace electrolytes.  Goal of Therapy:  Electrolytes within normal limits  Plan:  --Na 130, continue to monitor --Defer hypokalemia management to nephrology per CRRT --Mg 1.6, Magnesium sulfate 1 g IV x 1 dose --Hyperphosphatemia noted, will continue to monitor --Corrected calcium 8.2, continue to monitor --Continue to monitor renal function panels BID while on CRRT  Owens Loffler 06/02/2021 8:19 AM

## 2021-06-02 NOTE — Progress Notes (Addendum)
Inpatient Diabetes Program Recommendations  AACE/ADA: New Consensus Statement on Inpatient Glycemic Control (2015)  Target Ranges:  Prepandial:   less than 140 mg/dL      Peak postprandial:   less than 180 mg/dL (1-2 hours)      Critically ill patients:  140 - 180 mg/dL    Latest Reference Range & Units 06/01/21 12:09 06/01/21 12:52 06/01/21 15:00 06/01/21 15:37 06/01/21 22:55 06/01/21 22:57 06/01/21 23:31 06/02/21 07:09  Glucose-Capillary 70 - 99 mg/dL 43 (LL) 120 (H) 79 75 49 (L) 49 (L) 108 (H) 71      Home DM Meds: Lantus 20 units QHS (NOT taking)  Current Orders: Novolog Sensitive Correction Scale/ SSI (0-9 units) TID AC   Getting Solucortef 100 mg BID    MD- Note patient on Vent.  CBGs labile.  Please reduce the Novolog SSi to the very sensitive scale 0-6 units Q4 hours  Please increase the frequency of the CBG checks to Q4 hours as well     --Will follow patient during hospitalization--  Wyn Quaker RN, MSN, CDE Diabetes Coordinator Inpatient Glycemic Control Team Team Pager: [phone] (8a-5p)

## 2021-06-02 NOTE — Progress Notes (Signed)
[facility]  ROUNDING NOTE   Subjective:   CRRT started yesterday.   Patient weaned to norepinephrine 4.  On pressure support and following commands. Aunt at bedside.   Arctic sun protocol.    Objective:  Vital signs in last 24 hours:  Temp:  [93.1 F (33.9 C)-97.7 F (36.5 C)] 97.5 F (36.4 C) (01/20 0800) Pulse Rate:  [71-105] 91 (01/20 0700) Resp:  [0-47] 16 (01/19 1800) BP: (78-194)/(53-136) 98/58 (01/20 0700) SpO2:  [95 %-100 %] 100 % (01/20 0809) Arterial Line BP: (94-201)/(50-96) 145/72 (01/20 0809) FiO2 (%):  [30 %-100 %] 30 % (01/20 0809) Weight:  [75 kg] 75 kg (01/20 0424)  Weight change:  Filed Weights   05/31/21 0637 06/02/21 0424  Weight: 65.8 kg 75 kg    Intake/Output: I/O last 3 completed shifts: In: 7829 [I.V.:3474; Blood:525; Other:10; IV Piggyback:400] Out: 2719 [Urine:39; FAOZH:0865; HQION:6295]   Intake/Output this shift:  Total I/O In: 226.5 [I.V.:226.5] Out: 193 [Other:193]  Physical Exam: General: Critically ill  Head: ETT  Eyes: Anicteric  Lungs:  Pressure Support. FIO2 30%  Heart: Regular rate and rhythm  Abdomen:  Soft, nontender  Extremities: + peripheral edema.  Neurologic: Intubated. Following commands off sedation  Skin: No lesions  Access:  Right femoral temp HD catheter 2/84/13    Basic Metabolic Panel: Recent Labs  Lab 05/31/21 0835 06/01/21 0404 06/01/21 1325 06/01/21 1937 06/02/21 0357  NA 131* 128* 131* 133* 130*  K 4.4 4.3 3.6 4.1 3.1*  CL 102 101 98 96* 99  CO2 15* 14* 11* 15* 19*  GLUCOSE 177* 53* 104* 65* 95  BUN 90* 95* 88* 91* 58*  CREATININE 13.86* 13.14* 12.78* 12.67* 7.70*  CALCIUM 6.7* 6.3* 6.5* 6.6* 6.6*  MG  --   --   --   --  1.6*  PHOS  --  5.6*  --  7.6* 5.8*     Liver Function Tests: Recent Labs  Lab 05/31/21 0835 06/01/21 0404 06/01/21 1325 06/01/21 1937 06/02/21 0357  AST 24  --  101*  --   --   ALT 33  --  78*  --   --   ALKPHOS 70  --  68  --   --   BILITOT  0.6  --  1.1  --   --   PROT 6.3*  --  4.7*  --   --   ALBUMIN 2.9* 2.2* 2.0* 2.1* 2.0*    Recent Labs  Lab 05/31/21 0835  LIPASE 29    No results for input(s): AMMONIA in the last 168 hours.  CBC: Recent Labs  Lab 05/31/21 0835 06/01/21 0404 06/01/21 1325 06/02/21 0357  WBC 12.7* 4.5 4.9 8.6  NEUTROABS 11.6*  --   --   --   HGB 5.2* 5.9* 7.7* 9.2*  HCT 15.8* 17.4* 21.9* 25.4*  MCV 89.8 85.7 87.6 84.1  PLT 172 124* 100* 76*     Cardiac Enzymes: Recent Labs  Lab 05/31/21 1114  CKTOTAL 411*     BNP: Invalid input(s): POCBNP  CBG: Recent Labs  Lab 06/01/21 1537 06/01/21 2255 06/01/21 2257 06/01/21 2331 06/02/21 0709  GLUCAP 75 49* 49* 108* 71     Microbiology: Results for orders placed or performed during the hospital encounter of 05/31/21  Resp Panel by RT-PCR (Flu A&B, Covid) Nasopharyngeal Swab     Status: None   Collection Time: 05/31/21  6:58 AM   Specimen: Nasopharyngeal Swab; Nasopharyngeal(NP) swabs in vial transport medium  Result  Value Ref Range Status   SARS Coronavirus 2 by RT PCR NEGATIVE NEGATIVE Final    Comment: (NOTE) SARS-CoV-2 target nucleic acids are NOT DETECTED.  The SARS-CoV-2 RNA is generally detectable in upper respiratory specimens during the acute phase of infection. The lowest concentration of SARS-CoV-2 viral copies this assay can detect is 138 copies/mL. A negative result does not preclude SARS-Cov-2 infection and should not be used as the sole basis for treatment or other patient management decisions. A negative result may occur with  improper specimen collection/handling, submission of specimen other than nasopharyngeal swab, presence of viral mutation(s) within the areas targeted by this assay, and inadequate number of viral copies(<138 copies/mL). A negative result must be combined with clinical observations, patient history, and epidemiological information. The expected result is Negative.  Fact Sheet for  Patients:  EntrepreneurPulse.com.au  Fact Sheet for Healthcare Providers:  IncredibleEmployment.be  This test is no t yet approved or cleared by the [place] FDA and  has been authorized for detection and/or diagnosis of SARS-CoV-2 by FDA under an Emergency Use Authorization (EUA). This EUA will remain  in effect (meaning this test can be used) for the duration of the COVID-19 declaration under Section 564(b)(1) of the Act, 21 U.S.C.section 360bbb-3(b)(1), unless the authorization is terminated  or revoked sooner.       Influenza A by PCR NEGATIVE NEGATIVE Final   Influenza B by PCR NEGATIVE NEGATIVE Final    Comment: (NOTE) The Xpert Xpress SARS-CoV-2/FLU/RSV plus assay is intended as an aid in the diagnosis of influenza from Nasopharyngeal swab specimens and should not be used as a sole basis for treatment. Nasal washings and aspirates are unacceptable for Xpert Xpress SARS-CoV-2/FLU/RSV testing.  Fact Sheet for Patients: EntrepreneurPulse.com.au  Fact Sheet for Healthcare Providers: IncredibleEmployment.be  This test is not yet approved or cleared by the [place] FDA and has been authorized for detection and/or diagnosis of SARS-CoV-2 by FDA under an Emergency Use Authorization (EUA). This EUA will remain in effect (meaning this test can be used) for the duration of the COVID-19 declaration under Section 564(b)(1) of the Act, 21 U.S.C. section 360bbb-3(b)(1), unless the authorization is terminated or revoked.  Performed at [facility], [place], [place] [address]   Respiratory (~20 pathogens) panel by PCR     Status: None   Collection Time: 05/31/21  6:58 AM   Specimen: Nasopharyngeal Swab; Respiratory  Result Value Ref Range Status   Adenovirus NOT DETECTED NOT DETECTED Final   Coronavirus 229E NOT DETECTED NOT DETECTED Final    Comment: (NOTE) The Coronavirus  on the Respiratory Panel, DOES NOT test for the novel  Coronavirus (2019 nCoV)    Coronavirus HKU1 NOT DETECTED NOT DETECTED Final   Coronavirus NL63 NOT DETECTED NOT DETECTED Final   Coronavirus OC43 NOT DETECTED NOT DETECTED Final   Metapneumovirus NOT DETECTED NOT DETECTED Final   Rhinovirus / Enterovirus NOT DETECTED NOT DETECTED Final   Influenza A NOT DETECTED NOT DETECTED Final   Influenza B NOT DETECTED NOT DETECTED Final   Parainfluenza Virus 1 NOT DETECTED NOT DETECTED Final   Parainfluenza Virus 2 NOT DETECTED NOT DETECTED Final   Parainfluenza Virus 3 NOT DETECTED NOT DETECTED Final   Parainfluenza Virus 4 NOT DETECTED NOT DETECTED Final   Respiratory Syncytial Virus NOT DETECTED NOT DETECTED Final   Bordetella pertussis NOT DETECTED NOT DETECTED Final   Bordetella Parapertussis NOT DETECTED NOT DETECTED Final   Chlamydophila pneumoniae NOT DETECTED NOT DETECTED  Final   Mycoplasma pneumoniae NOT DETECTED NOT DETECTED Final    Comment: Performed at [facility], [place] [address], [place], [place] [address]  Group A Strep by PCR [facility] Only)     Status: None   Collection Time: 05/31/21  8:35 AM   Specimen: Throat; Sterile Swab  Result Value Ref Range Status   Group A Strep by PCR NOT DETECTED NOT DETECTED Final    Comment: Performed at [facility], [address] [place], [place] [address]  Blood culture (routine x 2)     Status: None (Preliminary result)   Collection Time: 05/31/21  8:39 AM   Specimen: BLOOD  Result Value Ref Range Status   Specimen Description BLOOD BLOOD RIGHT FOREARM  Final   Special Requests   Final    BOTTLES DRAWN AEROBIC AND ANAEROBIC Blood Culture results may not be optimal due to an excessive volume of blood received in culture bottles   Culture   Final    NO GROWTH 2 DAYS Performed at [facility], [address] [place], [place] [address]    Report Status PENDING  Incomplete  Blood culture (routine x 2)     Status:  None (Preliminary result)   Collection Time: 05/31/21  8:39 AM   Specimen: BLOOD  Result Value Ref Range Status   Specimen Description BLOOD RIGHT ANTECUBITAL  Final   Special Requests   Final    BOTTLES DRAWN AEROBIC AND ANAEROBIC Blood Culture results may not be optimal due to an excessive volume of blood received in culture bottles   Culture   Final    NO GROWTH 2 DAYS Performed at [facility], [address], [place], [place] [address]    Report Status PENDING  Incomplete  C Difficile Quick Screen w PCR reflex     Status: None   Collection Time: 05/31/21  1:20 PM   Specimen: Stool  Result Value Ref Range Status   C Diff antigen NEGATIVE NEGATIVE Final   C Diff toxin NEGATIVE NEGATIVE Final   C Diff interpretation No C. difficile detected.  Final    Comment: Performed at [facility], [address] [place], [place] [address]  Gastrointestinal Panel by PCR , Stool     Status: None   Collection Time: 05/31/21  1:30 PM   Specimen: Stool  Result Value Ref Range Status   Campylobacter species NOT DETECTED NOT DETECTED Final   Plesimonas shigelloides NOT DETECTED NOT DETECTED Final   Salmonella species NOT DETECTED NOT DETECTED Final   Yersinia enterocolitica NOT DETECTED NOT DETECTED Final   Vibrio species NOT DETECTED NOT DETECTED Final   Vibrio cholerae NOT DETECTED NOT DETECTED Final   Enteroaggregative E coli (EAEC) NOT DETECTED NOT DETECTED Final   Enteropathogenic E coli (EPEC) NOT DETECTED NOT DETECTED Final   Enterotoxigenic E coli (ETEC) NOT DETECTED NOT DETECTED Final   Shiga like toxin producing E coli (STEC) NOT DETECTED NOT DETECTED Final   Shigella/Enteroinvasive E coli (EIEC) NOT DETECTED NOT DETECTED Final   Cryptosporidium NOT DETECTED NOT DETECTED Final   Cyclospora cayetanensis NOT DETECTED NOT DETECTED Final   Entamoeba histolytica NOT DETECTED NOT DETECTED Final   Giardia lamblia NOT DETECTED NOT DETECTED Final   Adenovirus F40/41 NOT  DETECTED NOT DETECTED Final   Astrovirus NOT DETECTED NOT DETECTED Final   Norovirus GI/GII NOT DETECTED NOT DETECTED Final   Rotavirus A NOT DETECTED NOT DETECTED Final   Sapovirus (I, II, IV,  and V) NOT DETECTED NOT DETECTED Final    Comment: Performed at [facility], [address] [place], [place] [address]  Culture, Respiratory w Gram Stain     Status: None (Preliminary result)   Collection Time: 06/01/21  3:59 PM   Specimen: Tracheal Aspirate; Respiratory  Result Value Ref Range Status   Specimen Description   Final    TRACHEAL ASPIRATE Performed at [facility], [address] [place], [place] [address]    Special Requests   Final    NONE Performed at [facility], [address] [place], [place] [address]    Gram Stain   Final    RARE SQUAMOUS EPITHELIAL CELLS PRESENT MODERATE WBC PRESENT,BOTH PMN AND MONONUCLEAR MODERATE GRAM POSITIVE COCCI FEW GRAM NEGATIVE RODS FEW GRAM POSITIVE RODS RARE YEAST Performed at [facility], [place] [address], [place], [place] [address]    Culture PENDING  Incomplete   Report Status PENDING  Incomplete  CULTURE, BLOOD (ROUTINE X 2) w Reflex to ID Panel     Status: None (Preliminary result)   Collection Time: 06/01/21  7:06 PM   Specimen: BLOOD  Result Value Ref Range Status   Specimen Description BLOOD RIGHT ANTECUBITAL  Final   Special Requests   Final    BOTTLES DRAWN AEROBIC ONLY Blood Culture results may not be optimal due to an inadequate volume of blood received in culture bottles   Culture   Final    NO GROWTH < 12 HOURS Performed at [facility], [address] [place], [place] [address]    Report Status PENDING  Incomplete  CULTURE, BLOOD (ROUTINE X 2) w Reflex to ID Panel     Status: None (Preliminary result)   Collection Time: 06/01/21  7:37 PM   Specimen: BLOOD  Result Value Ref Range Status   Specimen Description BLOOD [facility]  Final   Special Requests BOTTLES DRAWN AEROBIC  AND ANAEROBIC BCLV  Final   Culture   Final    NO GROWTH < 12 HOURS Performed at [facility], [address] [place],  [address]    Report Status PENDING  Incomplete    Coagulation Studies: Recent Labs    05/31/21 0835 06/01/21 1715  LABPROT 15.8* 25.0*  INR 1.3* 2.3*     Urinalysis: Recent Labs    05/31/21 0927 06/01/21 1023  COLORURINE YELLOW* AMBER*  LABSPEC 1.012 1.017  PHURINE 6.0 6.0  GLUCOSEU 50* 50*  HGBUR MODERATE* MODERATE*  BILIRUBINUR NEGATIVE NEGATIVE  KETONESUR NEGATIVE NEGATIVE  PROTEINUR >=300* >=300*  NITRITE NEGATIVE NEGATIVE  LEUKOCYTESUR NEGATIVE LARGE*       Imaging: DG Abd 1 View  Result Date: 06/01/2021 CLINICAL DATA:  Encounter for feeding tube placement EXAM: ABDOMEN - 1 VIEW COMPARISON:  X-ray abdomen 06/11/2020. FINDINGS: Enteric tube coursing below the hemidiaphragm with tip and side port overlying the gastric lumen. Tip likely in the region of the pylorus/first portion of the duodenum. Bilateral femoral approach catheters noted with left catheter overlying the L5 vertebral body and right catheter with tip overlying the right aspect of the L1 vertebral body. Nonobstructive bowel gas pattern. Slight distension of the small bowel with gas. No radio-opaque calculi or other significant radiographic abnormality are seen. IMPRESSION: Enteric tube coursing below within the gastric lumen with tip overlying the expected region of the pylorus. Consider retracting by 3 cm. Electronically Signed   By: Iven Finn M.D.   On: 06/01/2021 18:11   CT HEAD WO  CONTRAST (5MM)  Result Date: 06/01/2021 CLINICAL DATA:  Altered mental status EXAM: CT HEAD WITHOUT CONTRAST TECHNIQUE: Contiguous axial images were obtained from the base of the skull through the vertex without intravenous contrast. RADIATION DOSE REDUCTION: This exam was performed according to the departmental dose-optimization program which includes automated exposure control,  adjustment of the mA and/or kV according to patient size and/or use of iterative reconstruction technique. COMPARISON:  05/21/2016 FINDINGS: Brain: No acute intracranial findings are seen. There are no signs of bleeding within the cranium. Ventricles are not dilated. There is no shift of midline structures. There are no epidural or subdural fluid collections. Vascular: Unremarkable. Skull: Unremarkable. Sinuses/Orbits: Air-fluid level is seen in the sphenoid sinus. There is mucosal thickening in the ethmoid and maxillary sinuses. Small air-fluid levels are seen in both maxillary sinuses. Other: None IMPRESSION: No acute intracranial findings are seen in noncontrast CT brain. Chronic sinusitis. Air-fluid levels are seen in the sphenoid and maxillary sinuses suggesting possible acute sinusitis. Electronically Signed   By: Elmer Picker M.D.   On: 06/01/2021 17:03   CT CHEST WO CONTRAST  Result Date: 05/31/2021 CLINICAL DATA:  Body aches, chills, nausea since Sunday. EXAM: CT CHEST WITHOUT CONTRAST TECHNIQUE: Multidetector CT imaging of the chest was performed following the standard protocol without IV contrast. RADIATION DOSE REDUCTION: This exam was performed according to the departmental dose-optimization program which includes automated exposure control, adjustment of the mA and/or kV according to patient size and/or use of iterative reconstruction technique. COMPARISON:  Same day chest radiograph, CT chest 01/02/2020 FINDINGS: Cardiovascular: The heart is not enlarged. There is trace pericardial fluid. The blood pool is hypodense suggesting anemia. The vasculature is unremarkable, as can be assessed in the absence of intravenous contrast. Mediastinum/Nodes: The thyroid is unremarkable. The esophagus is grossly unremarkable. There is no mediastinal or axillary lymphadenopathy. There is no bulky hilar adenopathy, suboptimally evaluated in the absence of intravenous contrast. Lungs/Pleura: The trachea and  central airways are patent. The lungs are well inflated. There is no focal consolidation or pulmonary edema. There is no pleural effusion or pneumothorax. Upper Abdomen: The imaged portions of the upper abdominal viscera are unremarkable. Musculoskeletal: Multiple remote right-sided rib fractures are seen. There is no acute osseous abnormality or aggressive osseous lesion. Other: There is mild diffuse body wall edema. IMPRESSION: 1. Trace pericardial effusion and mild diffuse body wall edema, new since 01/02/2020. 2. Clear lungs, with no focal consolidation or pleural effusion. 3. Hypodense blood pool consistent with anemia. Electronically Signed   By: Valetta Mole M.D.   On: 05/31/2021 12:08   US RENAL  Result Date: 05/31/2021 CLINICAL DATA:  Acute kidney injury EXAM: RENAL / URINARY TRACT ULTRASOUND COMPLETE COMPARISON:  06/10/2020 FINDINGS: Right Kidney: Renal measurements: 10.9 x 6.1 x 7.0 cm = volume: 244 mL. Increased cortical echogenicity with medical renal disease. Trace retroperitoneal perinephric edema. No hydronephrosis focal abnormality. Left Kidney: Renal measurements: 11.1 x 6.9 x 6.5 cm = volume: 257 mL. Similar increased cortical echogenicity compatible with medical renal disease. Similar trace retroperitoneal perinephric fluid/edema. No hydronephrosis. No focal abnormality. Bladder: Nearly collapsed, accounting for wall prominence Other: None. IMPRESSION: Increased renal echogenicity compatible with medical renal disease. Retroperitoneal perinephric edema suspect related to volume overload. Electronically Signed   By: Jerilynn Mages.  Shick M.D.   On: 05/31/2021 14:01   DG Chest Port 1 View  Result Date: 06/02/2021 CLINICAL DATA:  Acute respiratory failure, hypoxia EXAM: PORTABLE CHEST 1 VIEW COMPARISON:  Chest radiograph 1 day prior  FINDINGS: The endotracheal tube is approximately 2.6 cm from the carina. The enteric catheter tip is off the field of view. A presumed esophageal temperature probe  terminates in the upper thorax. The cardiac silhouette is enlarged, unchanged. The mediastinal contours are stable. There are patchy opacities in the lung bases, particularly on the right, likely not significantly changed in the interim. There is no significant pleural effusion. There is no pneumothorax. There is no acute osseous abnormality. IMPRESSION: 1. Unchanged enlargement of the cardiac silhouette. 2. Patchy bibasilar opacities are not significantly changed and may reflect atelectasis or developing infection. 3. Presumed esophageal temperature probe terminating in the upper thorax. Endotracheal tube in satisfactory position. Enteric catheter tip is off the field of view. Electronically Signed   By: Valetta Mole M.D.   On: 06/02/2021 07:25   DG Chest Port 1 View  Result Date: 06/01/2021 CLINICAL DATA:  Intubation. EXAM: PORTABLE CHEST 1 VIEW COMPARISON:  Chest radiographs and CT 05/31/2021 FINDINGS: An endotracheal tube has been placed and terminates proximally 2.5 cm above the carina. Telemetry leads overlie the chest. The cardiac silhouette is accentuated by portable AP technique although true mild interval enlargement, such as from a pericardial effusion, is not excluded. Lung volumes are lower than on the prior radiographs, and there are mild opacities in both lung bases. No sizable pleural effusion or pneumothorax is identified. IMPRESSION: 1. Endotracheal tube in appropriate position. 2. Low lung volumes with mild bibasilar opacities which may reflect atelectasis or infection. Electronically Signed   By: Logan Bores M.D.   On: 06/01/2021 13:56   ECHOCARDIOGRAM COMPLETE  Result Date: 06/01/2021    ECHOCARDIOGRAM REPORT   Patient Name:   LENY MOROZOV Date of Exam: 06/01/2021 Medical Rec #:  [id number]       Height:       68.0 in Accession #:    [id number]      Weight:       145.0 lb Date of Birth:  September 03, 1995       BSA:          1.783 m Patient Age:    25 years        BP:           155/83 mmHg  Patient Gender: M               HR:           95 bpm. Exam Location:  [facility] Procedure: 2D Echo, Color Doppler, Cardiac Doppler and Strain Analysis Indications:     I46.9 Cardiac arrest  History:         Patient has no prior history of Echocardiogram examinations.                  ESRD, stage IV, Signs/Symptoms:Fever; Risk Factors:Diabetes.  Sonographer:     Charmayne Sheer Referring Phys:  [id number] Bradly Bienenstock Diagnosing Phys: Ida Rogue MD  Sonographer Comments: Echo performed with patient supine and on artificial respirator. Global longitudinal strain was attempted. IMPRESSIONS  1. Left ventricular ejection fraction, by estimation, is 35 to 40%. The left ventricle has moderately decreased function. The left ventricle demonstrates global hypokinesis. There is moderate left ventricular hypertrophy. Left ventricular diastolic parameters are consistent with Grade II diastolic dysfunction (pseudonormalization). The average left ventricular global longitudinal strain is -11.3 %. The global longitudinal strain is abnormal.  2. Right ventricular systolic function is mildly reduced. The right ventricular size is normal. There is moderately elevated pulmonary artery systolic pressure.  The estimated right ventricular systolic pressure is 02.5 mmHg.  3. Left atrial size was mildly dilated.  4. The mitral valve is normal in structure. Mild mitral valve regurgitation. No evidence of mitral stenosis.  5. The aortic valve is normal in structure. Aortic valve regurgitation is mild. No aortic stenosis is present.  6. The inferior vena cava is dilated in size with <50% respiratory variability, suggesting right atrial pressure of 15 mmHg. FINDINGS  Left Ventricle: Left ventricular ejection fraction, by estimation, is 35 to 40%. The left ventricle has moderately decreased function. The left ventricle demonstrates global hypokinesis. The average left ventricular global longitudinal strain is -11.3 %. The global longitudinal strain is  abnormal. The left ventricular internal cavity size was normal in size. There is moderate left ventricular hypertrophy. Left ventricular diastolic parameters are consistent with Grade II diastolic dysfunction (pseudonormalization). Right Ventricle: The right ventricular size is normal. No increase in right ventricular wall thickness. Right ventricular systolic function is mildly reduced. There is moderately elevated pulmonary artery systolic pressure. The tricuspid regurgitant velocity is 2.80 m/s, and with an assumed right atrial pressure of 15 mmHg, the estimated right ventricular systolic pressure is 85.2 mmHg. Left Atrium: Left atrial size was mildly dilated. Right Atrium: Right atrial size was normal in size. Pericardium: There is no evidence of pericardial effusion. Mitral Valve: The mitral valve is normal in structure. Mild mitral valve regurgitation. No evidence of mitral valve stenosis. MV peak gradient, 5.4 mmHg. The mean mitral valve gradient is 3.0 mmHg. Tricuspid Valve: The tricuspid valve is normal in structure. Tricuspid valve regurgitation is mild . No evidence of tricuspid stenosis. Aortic Valve: The aortic valve is normal in structure. Aortic valve regurgitation is mild. No aortic stenosis is present. Aortic valve mean gradient measures 6.0 mmHg. Aortic valve peak gradient measures 10.1 mmHg. Aortic valve area, by VTI measures 2.64  cm. Pulmonic Valve: The pulmonic valve was normal in structure. Pulmonic valve regurgitation is trivial. No evidence of pulmonic stenosis. Aorta: The aortic root is normal in size and structure. Venous: The inferior vena cava is dilated in size with less than 50% respiratory variability, suggesting right atrial pressure of 15 mmHg. IAS/Shunts: No atrial level shunt detected by color flow Doppler.  LEFT VENTRICLE PLAX 2D LVIDd:         4.90 cm   Diastology LVIDs:         3.79 cm   LV e' medial:    8.59 cm/s LV PW:         1.43 cm   LV E/e' medial:  14.4 LV IVS:         0.93 cm   LV e' lateral:   9.46 cm/s LVOT diam:     2.10 cm   LV E/e' lateral: 13.1 LV SV:         58 LV SV Index:   33        2D Longitudinal Strain LVOT Area:     3.46 cm  2D Strain GLS Avg:     -11.3 %  RIGHT VENTRICLE RV Basal diam:  3.95 cm RV S prime:     11.40 cm/s LEFT ATRIUM             Index        RIGHT ATRIUM           Index LA diam:        4.40 cm 2.47 cm/m   RA Area:     20.10 cm LA  Vol [facility]):   77.6 ml 43.53 ml/m  RA Volume:   60.00 ml  33.66 ml/m LA Vol (A4C):   73.9 ml 41.45 ml/m LA Biplane Vol: 79.4 ml 44.54 ml/m  AORTIC VALVE                     PULMONIC VALVE AV Area (Vmax):    2.40 cm      PV Vmax:       1.12 m/s AV Area (Vmean):   2.23 cm      PV Vmean:      82.500 cm/s AV Area (VTI):     2.64 cm      PV VTI:        0.187 m AV Vmax:           159.00 cm/s   PV Peak grad:  5.0 mmHg AV Vmean:          113.000 cm/s  PV Mean grad:  3.0 mmHg AV VTI:            0.220 m AV Peak Grad:      10.1 mmHg AV Mean Grad:      6.0 mmHg LVOT Vmax:         110.00 cm/s LVOT Vmean:        72.600 cm/s LVOT VTI:          0.168 m LVOT/AV VTI ratio: 0.76  AORTA Ao Root diam: 2.80 cm MITRAL VALVE                TRICUSPID VALVE MV Area (PHT): 5.16 cm     TR Peak grad:   31.4 mmHg MV Area VTI:   2.47 cm     TR Vmax:        280.00 cm/s MV Peak grad:  5.4 mmHg MV Mean grad:  3.0 mmHg     SHUNTS MV Vmax:       1.16 m/s     Systemic VTI:  0.17 m MV Vmean:      77.4 cm/s    Systemic Diam: 2.10 cm MV Decel Time: 147 msec MV E velocity: 124.00 cm/s MV A velocity: 54.40 cm/s MV E/A ratio:  2.28 Ida Rogue MD Electronically signed by Ida Rogue MD Signature Date/Time: 06/01/2021/6:41:43 PM    Final      Medications:    dextrose 30 mL/hr at 06/02/21 0800   fentaNYL infusion INTRAVENOUS Stopped (06/02/21 0718)   magnesium sulfate bolus IVPB 1 g (06/02/21 0852)   norepinephrine (LEVOPHED) Adult infusion 5 mcg/min (06/02/21 0829)   piperacillin-tazobactam 100 mL/hr at 06/02/21 0800   prismasol BGK 2/2.5  dialysis solution 2,000 mL/hr at 06/02/21 0540   prismasol BGK 2/2.5 replacement solution 500 mL/hr at 06/01/21 2155   prismasol BGK 2/2.5 replacement solution 500 mL/hr at 06/01/21 2155   propofol (DIPRIVAN) infusion Stopped (06/02/21 0717)   vancomycin      busPIRone  30 mg Oral Q8H   Or   busPIRone  30 mg Per Tube Q8H   chlorhexidine gluconate (MEDLINE KIT)  15 mL Mouth Rinse BID   Chlorhexidine Gluconate Cloth  6 each Topical Daily   Chlorhexidine Gluconate Cloth  6 each Topical Daily   Chlorhexidine Gluconate Cloth  6 each Topical Daily   docusate  100 mg Per Tube BID   hydrocortisone sod succinate (SOLU-CORTEF) inj  100 mg Intravenous Q12H   mouth rinse  15 mL Mouth Rinse 10 times per day   pantoprazole (PROTONIX)  IV  40 mg Intravenous Daily   polyethylene glycol  17 g Per Tube Daily   fentaNYL, guaiFENesin-dextromethorphan, heparin, midazolam, midazolam, ondansetron **OR** ondansetron (ZOFRAN) IV, oxyCODONE, sodium chloride  Assessment/ Plan:  [name] is a 26 y.o. black male with hypertension, insulin dependent diabetes mellitus type I, diabetic gastroparesis, diabetic neuropathy, tobacco use, THC use, right toe amputation, perirectal abscess, who is admitted to [facility] on 05/31/2021 for SIRS (systemic inflammatory response syndrome) ([place]) [R65.10] AKI (acute kidney injury) ([place]) [N17.9] Symptomatic anemia [D64.9] Acute renal failure superimposed on stage 5 chronic kidney disease, not on chronic dialysis, unspecified acute renal failure type ([place]) [N17.9, N18.5] Hypertension, unspecified type [I10] Acute cough [R05.1]  Cardiac arrest yesterday with code blue called. Transferred to ICU. Intubate  Acute kidney injury on chronic kidney disease stage IV versus progression of chronic kidney disease to end stage renal disease. Baseline creatinine of 4.13, GFR of 19 on 08/26/20. History of nephrotic range proteinuria and hematuria. No history of renal biopsy. Strong family  history of dialysis with mother with ESRD before passing.  - remains hemodynamically unstable.  - Continue CVVHD with no ultrafiltration.   Metablic acidosis: acute. Secondary to renal failure.  - Discontinue Sodium bicarb infusion. Now on CRRT  Hypocalcemia: suspect secondary to renal failure and secondary hyperparathyroidism - Change to 3.5 calcium bath  Anemia of chronic kidney disease and anemia of iron deficiency. Status post multiple transfusion  Hypotension with cardiogenic shock.  - requiring vasopressors.   Hyponatremia: secondary to renal failure. Renal replacement as above.   Discussed plan and prognosis with patient's Aunt, Tammy. Patient remains critically ill.    LOS: 2 Stephanie Littman 1/20/20238:54 AM

## 2021-06-02 NOTE — Progress Notes (Signed)
PT able to open eyes to voice and follow commands when sedation turned off. Not extubating this shift. Pt re-sedated with light sedation. Precedex added in place of propofol. PRN versed for agitation. VSS, Levophed titrated per orders. Trickle tube feeds started and pt is tolerating so far. Making a large amount of loose stool this shift. 18cc urine output this shift. CRRT running without complications. Dialysis catheter intact. Arterial line appropriate and zeroed. Will continue to monitor.

## 2021-06-02 NOTE — Progress Notes (Signed)
[place] Progress Note Patient Name: Jonathan Mcmillan DOB: June 07, 1995 MRN: [id number]   Date of Service  06/02/2021  HPI/Events of Note Asked by bedside RN re: Tylenol standing order post cardiac arrest for TTM. LFT's are moderately elevated. Patient already on [place] and TTM already being achieved at temp 35C currently    eICU Interventions D/c Tylenol          [name] 06/02/2021, 12:57 AM

## 2021-06-03 ENCOUNTER — Inpatient Hospital Stay: Payer: Medicaid Other

## 2021-06-03 LAB — RENAL FUNCTION PANEL
Albumin: 1.9 g/dL — ABNORMAL LOW (ref 3.5–5.0)
Albumin: 1.7 g/dL — ABNORMAL LOW (ref 3.5–5.0)
Anion gap: 11 (ref 5–15)
Anion gap: 10 (ref 5–15)
BUN: 23 mg/dL — ABNORMAL HIGH (ref 6–20)
BUN: 17 mg/dL (ref 6–20)
CO2: 23 mmol/L (ref 22–32)
CO2: 24 mmol/L (ref 22–32)
Calcium: 7.9 mg/dL — ABNORMAL LOW (ref 8.9–10.3)
Calcium: 8.3 mg/dL — ABNORMAL LOW (ref 8.9–10.3)
Chloride: 97 mmol/L — ABNORMAL LOW (ref 98–111)
Chloride: 102 mmol/L (ref 98–111)
Creatinine, Ser: 2.85 mg/dL — ABNORMAL HIGH (ref 0.61–1.24)
Creatinine, Ser: 1.97 mg/dL — ABNORMAL HIGH (ref 0.61–1.24)
GFR, Estimated: 30 mL/min — ABNORMAL LOW (ref >60)
GFR, Estimated: 47 mL/min — ABNORMAL LOW (ref >60)
Glucose, Bld: 146 mg/dL — ABNORMAL HIGH (ref 70–99)
Glucose, Bld: 134 mg/dL — ABNORMAL HIGH (ref 70–99)
Phosphorus: 3.7 mg/dL (ref 2.5–4.6)
Phosphorus: 2.2 mg/dL — ABNORMAL LOW (ref 2.5–4.6)
Potassium: 3.5 mmol/L (ref 3.5–5.1)
Potassium: 3.2 mmol/L — ABNORMAL LOW (ref 3.5–5.1)
Sodium: 131 mmol/L — ABNORMAL LOW (ref 135–145)
Sodium: 136 mmol/L (ref 135–145)

## 2021-06-03 LAB — HEPATIC FUNCTION PANEL
ALT: 42 U/L (ref 0–44)
AST: 21 U/L (ref 15–41)
Albumin: 1.7 g/dL — ABNORMAL LOW (ref 3.5–5.0)
Alkaline Phosphatase: 91 U/L (ref 38–126)
Bilirubin, Direct: 1.3 mg/dL — ABNORMAL HIGH (ref 0.0–0.2)
Indirect Bilirubin: 0.9 mg/dL (ref 0.3–0.9)
Total Bilirubin: 2.2 mg/dL — ABNORMAL HIGH (ref 0.3–1.2)
Total Protein: 4.4 g/dL — ABNORMAL LOW (ref 6.5–8.1)

## 2021-06-03 LAB — CBC
HCT: 26.2 % — ABNORMAL LOW (ref 39.0–52.0)
Hemoglobin: 9.2 g/dL — ABNORMAL LOW (ref 13.0–17.0)
MCH: 30.3 pg (ref 26.0–34.0)
MCHC: 35.1 g/dL (ref 30.0–36.0)
MCV: 86.2 fL (ref 80.0–100.0)
Platelets: 29 10*3/uL — CL (ref 150–400)
RBC: 3.04 MIL/uL — ABNORMAL LOW (ref 4.22–5.81)
RDW: 45.8 % — ABNORMAL HIGH (ref 11.5–15.5)
WBC: 25.9 10*3/uL — ABNORMAL HIGH (ref 4.0–10.5)
nRBC: 0 % (ref 0.0–0.2)

## 2021-06-03 LAB — BLOOD GAS, ARTERIAL
Acid-base deficit: 1.5 mmol/L (ref 0.0–2.0)
Bicarbonate: 24.3 mmol/L (ref 20.0–28.0)
O2 Saturation: 85.5 %
Patient temperature: 37
pCO2 arterial: 45 mmHg (ref 32.0–48.0)
pH, Arterial: 7.34 — ABNORMAL LOW (ref 7.350–7.450)
pO2, Arterial: 54 mmHg — ABNORMAL LOW (ref 83.0–108.0)

## 2021-06-03 LAB — URINE CULTURE: Culture: NO GROWTH

## 2021-06-03 LAB — MAGNESIUM: Magnesium: 1.8 mg/dL (ref 1.7–2.4)

## 2021-06-03 LAB — GLUCOSE, CAPILLARY
Glucose-Capillary: 102 mg/dL — ABNORMAL HIGH (ref 70–99)
Glucose-Capillary: 109 mg/dL — ABNORMAL HIGH (ref 70–99)
Glucose-Capillary: 111 mg/dL — ABNORMAL HIGH (ref 70–99)
Glucose-Capillary: 121 mg/dL — ABNORMAL HIGH (ref 70–99)
Glucose-Capillary: 144 mg/dL — ABNORMAL HIGH (ref 70–99)
Glucose-Capillary: 73 mg/dL (ref 70–99)
Glucose-Capillary: 92 mg/dL (ref 70–99)

## 2021-06-03 LAB — CULTURE, RESPIRATORY W GRAM STAIN: Culture: NORMAL

## 2021-06-03 LAB — PROCALCITONIN: Procalcitonin: 67.49 ng/mL

## 2021-06-03 MED ORDER — ETOMIDATE 2 MG/ML IV SOLN
INTRAVENOUS | Status: AC
  Administered 2021-06-04: 20 mg
  Filled 2021-06-03: qty 20

## 2021-06-03 MED ORDER — FENTANYL CITRATE (PF) 100 MCG/2ML IJ SOLN
INTRAMUSCULAR | Status: AC
  Administered 2021-06-04: 100 ug
  Filled 2021-06-03: qty 2

## 2021-06-03 MED ORDER — PRISMASOL BGK 4/2.5 32-4-2.5 MEQ/L REPLACEMENT SOLN
Status: DC
Start: 2021-06-03 — End: 2021-06-10
  Filled 2021-06-03 (×11): qty 5000

## 2021-06-03 MED ORDER — LIDOCAINE 5 % EX PTCH
1.0000 | MEDICATED_PATCH | CUTANEOUS | Status: DC
  Administered 2021-06-03 – 2021-06-10 (×6): 1 via TRANSDERMAL
  Filled 2021-06-03 (×9): qty 1

## 2021-06-03 MED ORDER — DEXTROSE 10 % IV SOLN: INTRAVENOUS | Status: AC

## 2021-06-03 MED ORDER — OXYCODONE HCL 5 MG PO TABS
5.0000 mg | ORAL_TABLET | ORAL | Status: DC | PRN
  Administered 2021-06-09 (×2): 5 mg via ORAL
  Filled 2021-06-03 (×2): qty 1

## 2021-06-03 MED ORDER — FENTANYL BOLUS VIA INFUSION
50.0000 ug | INTRAVENOUS | Status: DC | PRN
  Filled 2021-06-03: qty 100

## 2021-06-03 MED ORDER — PRISMASOL BGK 4/2.5 32-4-2.5 MEQ/L EC SOLN: Status: DC

## 2021-06-03 MED ORDER — FENTANYL 2500MCG IN NS 250ML (10MCG/ML) PREMIX INFUSION
50.0000 ug/h | INTRAVENOUS | Status: DC
  Administered 2021-06-04: 50 ug/h via INTRAVENOUS
  Administered 2021-06-05: 100 ug/h via INTRAVENOUS
  Administered 2021-06-06: 23:00:00 150 ug/h via INTRAVENOUS
  Administered 2021-06-06: 04:00:00 125 ug/h via INTRAVENOUS
  Administered 2021-06-07 – 2021-06-08 (×2): 200 ug/h via INTRAVENOUS
  Filled 2021-06-03 (×6): qty 250

## 2021-06-03 MED ORDER — ROCURONIUM BROMIDE 10 MG/ML (PF) SYRINGE
PREFILLED_SYRINGE | INTRAVENOUS | Status: AC
  Administered 2021-06-04: 100 mg
  Filled 2021-06-03: qty 10

## 2021-06-03 MED ORDER — PROPOFOL 1000 MG/100ML IV EMUL
0.0000 ug/kg/min | INTRAVENOUS | Status: DC
  Administered 2021-06-03: 5 ug/kg/min via INTRAVENOUS
  Administered 2021-06-04 – 2021-06-05 (×3): 15 ug/kg/min via INTRAVENOUS
  Administered 2021-06-06: 05:00:00 20 ug/kg/min via INTRAVENOUS
  Administered 2021-06-06 – 2021-06-07 (×3): 30 ug/kg/min via INTRAVENOUS
  Filled 2021-06-03 (×8): qty 100

## 2021-06-03 MED ORDER — PANTOPRAZOLE SODIUM 40 MG PO TBEC
40.0000 mg | DELAYED_RELEASE_TABLET | Freq: Every day | ORAL | Status: DC
  Administered 2021-06-04 – 2021-06-06 (×2): 40 mg via ORAL
  Filled 2021-06-03 (×2): qty 1

## 2021-06-03 NOTE — Progress Notes (Signed)
Chaplain Maggie offered support to pt's family at bedside and in the waiting room. Storytelling and empathetic listening were central to this visit. Family is open to pastoral care and welcomes conversation. They are encouraged by the improvement the pt is making and recognize he has a way to go.

## 2021-06-03 NOTE — Progress Notes (Signed)
Patient placed into SBT (PSV 5/5) by Dr. Jonnie Finner at this time.

## 2021-06-03 NOTE — Consult Note (Signed)
[place] for Electrolyte Monitoring and Replacement   Recent Labs: Potassium (mmol/L)  Date Value  06/03/2021 3.5  08/05/2013 3.8   Magnesium (mg/dL)  Date Value  06/03/2021 1.8   Calcium (mg/dL)  Date Value  06/03/2021 7.9 (L)   Calcium, Total (PTH) (mg/dL)  Date Value  06/12/2020 8.0   Albumin (g/dL)  Date Value  06/03/2021 1.9 (L)  08/05/2013 2.9 (L)   Phosphorus (mg/dL)  Date Value  06/03/2021 3.7   Sodium (mmol/L)  Date Value  06/03/2021 131 (L)  08/05/2013 133 (L)   Assessment: Jonathan Mcmillan is a 26 y.o. male with medical history including diabetes, CKD, HTN, nicotine dependence admitted on 05/31/2021 with  AKI on CKD, anemia, and fevers/chills/myalgias . Patient suffered cardiac arrest on the morning of 1/19 and was ultimately intubated and transferred to the ICU. Patient is currently on CRRT per nephrology. Pharmacy has been consulted to monitor and replace electrolytes.  Goal of Therapy:  Electrolytes within normal limits  Plan:  --Remains on CRRT --Phos trending down in setting of CRRT - may require replacement in next 24 hrs --Continue to monitor renal function panels BID while on CRRT  Tawnya Crook, PharmD, BCPS Clinical Pharmacist 06/03/2021 7:57 AM

## 2021-06-03 NOTE — Progress Notes (Signed)
NAME:  Jonathan Mcmillan, MRN:  [id number], DOB:  1995-08-22, LOS: 3 ADMISSION DATE:  05/31/2021, CONSULTATION DATE:  06/01/2021 REFERRING MD:  Dr. Leslye Peer, CHIEF COMPLAINT:  Cardiac Arrest   Brief Pt Description / Synopsis:  26 y.o. admitted with AKI on CKD in setting of viral syndrome and diarrhea.  Suffered in-hospital cardiac arrest (initial rhythm asystole), suspect due to severe metabolic acidosis and multiple metabolic derangements.  History of Present Illness:  Jonathan Mcmillan is a 26 year old male with a past medical history significant for diabetes mellitus type 1, chronic kidney disease stage IV, hypertension, nicotine dependence who presented to [facility] ED on 05/31/2021 due to 3-day history of fever, chills, nonproductive cough, myalgias, and diarrhea. Patient denied urinary frequency, nocturia, dysuria, abdominal pain, hematemesis, chest pain, shortness of breath, edema.  ED Course: Initial vital signs: Temperature 102.6, RR 22, HR 104, BP 139/70, SpO2 97% on room air Significant labs: Sodium 131, potassium 4.4, chloride 103, bicarb 15, glucose 177, BUN 19, creatinine 13.86 (baseline of 3.92), calcium 6.7, alkaline phosphatase 70, lipase 29, AST 24, ALT 33, lactic acid 2.1, WBC 12.7, hemoglobin 5.2 (baseline of 8.3) hematocrit 15.8, MCV 89.8, RDW 12.8, platelets 172, PT 15.8, INR 1.3. COVID-19 and influenza PCR negative Respiratory viral panel negative Urinalysis with proteinuria Imaging: Chest x-ray negative for acute cardiopulmonary disease CT Chest w/o contrast>>1. Trace pericardial effusion and mild diffuse body wall edema, new since 01/02/2020. 2. Clear lungs, with no focal consolidation or pleural effusion. 3. Hypodense blood pool consistent with anemia. Renal US>>Increased renal echogenicity compatible with medical renal disease. Retroperitoneal perinephric edema suspect related to volume overload.  Patient was admitted by the hospitalist for further work-up and treatment of  acute kidney injury on CKD.  Nephrology was consulted.  Interval History Early in the morning on 06/01/2021 rapid response was called when he was found in the shower on a chair passed out. Found to be  hypotensive, hypoglycemic, and lethargic with his "eyes rolling in the back of his head."  Patient was given 500 cc fluid bolus and ordered for blood transfusion.  However CODE BLUE was called as pt was found to be in asystole (progressed to PEA).  He received epinephrine x2, sodium bicarbonate x2, calcium x1, glucose and insulin.  Pulse was regained and he was transferred to ICU.  Upon arrival to the ICU patient patient is critically ill and concern for decorticate posturing.  PCCM consulted for further management.  Pertinent  Medical History  Chronic Kidney Disease Stage IV Diabetes Mellitus 1  Micro Data:  05/31/2021: SARS-CoV-2 and influenza PCR>> negative 05/31/2021: Respiratory viral panel>> negative 05/31/2021: Group A strep PCR>> not detected 05/31/2021: Blood culture x2>> no growth to date 05/31/2021: GI panel>> negative 05/31/2021: C. Difficile>> negative 06/01/2021: Tracheal aspirate>> gram + cocci, gram + rods, gram - rods,  06/01/2021: Urine>> 06/01/2021: Blood culture>>  Antimicrobials:  Ceftriaxone 1/19 x 1 dose Vancomycin 1/19>> Zosyn 1/19>>  Significant Hospital Events: Including procedures, antibiotic start and stop dates in addition to other pertinent events   1/18: Admitted by the hospitalist.  Nephrology consulted, plans for vascular to place temporary dialysis catheter with initiation of dialysis 1/19: In-hospital cardiac arrest.  Critically ill. Transferred to ICU.  Plan for CRRT.  Central line, A line, and Trialysis catheter placed.  Concern for possible anoxic brain injury.  Sepsis workup in progress 1/20: Pt is awake, tracking, following simple commands. Remains on CRRT, bicarb gtt d/c.  Weaning fiO2 and pressors. Tracheal aspirate growing gram + cocci,  gram + rods, gram  - rods 1/21: New leukocytosis, thrombocytopenia  Interim History / Subjective:  Afebrile.  Awake and following commands.  Remains on low-dose pressor support. Remains intubated.  Respiratory cultures are pending.  Remains on antibiotics.  Remains on CRRT, electrolytes have normalized.  There is new leukocytosis and thrombocytopenia. Objective   Blood pressure 92/61, pulse 95, temperature 98.8 F (37.1 C), temperature source Oral, resp. rate 16, height 5' 7.99" (1.727 m), weight 72.7 kg, SpO2 94 %.    Vent Mode: PRVC FiO2 (%):  [24 %] 24 % Set Rate:  [16 bmp] 16 bmp Vt Set:  [450 mL] 450 mL PEEP:  [5 cmH20] 5 cmH20 Plateau Pressure:  [11 cmH20] 11 cmH20   Intake/Output Summary (Last 24 hours) at 06/03/2021 1217 Last data filed at 06/03/2021 1200 Gross per 24 hour  Intake 3656.46 ml  Output 4724 ml  Net -1067.54 ml   Filed Weights   05/31/21 0637 06/02/21 0424 06/03/21 0409  Weight: 65.8 kg 75 kg 72.7 kg    Examination: General: ill-appearing male, laying in bed, intubated Lungs: CTA b/l, mechanical breath soudns Cardiovascular: Tachycardia, regular rhythm, S1-S2, no murmurs, rubs, gallops Abdomen: Soft, nontender, nondistended, no guarding rebound tenderness, bowel sounds positive x4 Extremities: Normal bulk and tone, no deformities, no edema Neuro: awake, following commands GU: Foley catheter in place with minimal urine output  Resolved Hospital Problem list     Assessment & Plan:   In-Hospital Cardiac Arrest (initial rhythm asystole), suspect in setting of severe metabolic acidosis and multiple metabolic derangements New systolic heart failure Multifactorial shock (cardiogenic, septic) Lactic acidosis PMHx: Hypertension -Continuous cardiac monitoring -Serial EKG's -Maintain MAP >65 -Transfusions as indicated -Vasopressors as needed to maintain MAP goal -Stop stress dose steroids -Echocardiogram 06/01/21>>LVEF 35-40% and global hypokinesis, grade II diastolic  dysfunction, mildly reduced RV function, and moderately increased Pulmonary systolic pressure -Urine drug screen + for Cannabinoid -Cardiology following, appreciate input  Acute Hypoxic Respiratory Failure in setting of Cardiac Arrest -Full vent support, implement lung protective strategies -Plateau pressures less than 30 cm H20 -Wean FiO2 & PEEP as tolerated to maintain O2 sats >92% -Follow intermittent Chest X-ray & ABG as needed -Implement VAP Bundle -SAT/SBT today -Prn Bronchodilators  Septic shock Aspiration pneumonia Admitted with suspected viral syndrome and diarrhea -Monitor fever curve -Trend WBC's & Procalcitonin -Follow cultures -Continue vanc, Zosyn while awaiting respiratory cultures  Anemia of Chronic Disease Thrombocytopenia (4T score is 3-4) -Monitor for S/Sx of bleeding -Trend CBC -SCD's for VTE Prophylaxis  -Transfuse for Hgb <7 -Transfuse platelets for platelet count <10K -Received 2 units prbc's 1/19 -Timeline does not fit with HIT, 4T score is low to moderate probability given his baseline dialysis requirement and probable heparin exposure -Suspect thrombocytopenia in setting of sepsis, however, will continue to monitor -Send HIT panel, [id number], haptoglobin; doubt HUS but on differential -Avoid heparin products  AKI on CKD Stage IV Severe Anion Gap Metabolic Acidosis in setting of AKI & Lactic Acidosis Hyponatremia Hypocalcemia -Monitor I&O's / urinary output -Follow BMP -Ensure adequate renal perfusion -Avoid nephrotoxic agents as able -Replace electrolytes as indicated -Nephrology following, appreciate input ~ Continues on CRRT  Elevated LFT's, suspect developing shock liver following cardiac arrest -Follow LFT's and bleeding times -Obtain viral Hepatitis panel  Hypoglycemia PMHx: DM type I -CBG's q4h; Target range of 140 to 180 -D10 infusion -Follow ICU Hypo/Hyperglycemia protocol  Pt is critically ill with multiorgan failure.  Prognosis  is guarded, high risk for further cardiac arrest and  death.    Best Practice (right click and "Reselect all SmartList Selections" daily)   Diet/type: NPO DVT prophylaxis: SCD (holding chemical prophylaxis due to thrombocytopenia) GI prophylaxis: PPI Lines: Central line and yes and it is still needed Foley:  Yes, and it is still needed Code Status:  full code Last date of multidisciplinary goals of care discussion [06/02/21]  Pt's aunts updated at bedside 06/02/21. All questions answered to their satisfaction.  Labs   CBC: Recent Labs  Lab 05/31/21 0835 06/01/21 0404 06/01/21 1325 06/02/21 0357 06/03/21 0307  WBC 12.7* 4.5 4.9 8.6 25.9*  NEUTROABS 11.6*  --   --   --   --   HGB 5.2* 5.9* 7.7* 9.2* 9.2*  HCT 15.8* 17.4* 21.9* 25.4* 26.2*  MCV 89.8 85.7 87.6 84.1 86.2  PLT 172 124* 100* 76* 29*    Basic Metabolic Panel: Recent Labs  Lab 06/01/21 0404 06/01/21 1325 06/01/21 1937 06/02/21 0357 06/02/21 1535 06/03/21 0307 06/03/21 0500  NA 128* 131* 133* 130* 133*  --  131*  K 4.3 3.6 4.1 3.1* 3.9  --  3.5  CL 101 98 96* 99 99  --  97*  CO2 14* 11* 15* 19* 20*  --  23  GLUCOSE 53* 104* 65* 95 96  --  146*  BUN 95* 88* 91* 58* 38*  --  23*  CREATININE 13.14* 12.78* 12.67* 7.70* 4.69*  --  2.85*  CALCIUM 6.3* 6.5* 6.6* 6.6* 8.0*  --  7.9*  MG  --   --   --  1.6*  --  1.8  --   PHOS 5.6*  --  7.6* 5.8* 5.1*  --  3.7   GFR: Estimated Creatinine Clearance: 37.7 mL/min (A) (by C-G formula based on SCr of 2.85 mg/dL (H)). Recent Labs  Lab 05/31/21 0835 05/31/21 1013 06/01/21 0404 06/01/21 1325 06/01/21 1715 06/02/21 0357 06/02/21 0916 06/02/21 1123 06/03/21 0307  PROCALCITON 1.10  --   --  108.03  --  93.56  --   --  67.49  WBC 12.7*  --  4.5 4.9  --  8.6  --   --  25.9*  LATICACIDVEN 2.1*   < >  --  8.3* 6.5*  --  4.7* 4.3*  --    < > = values in this interval not displayed.    Liver Function Tests: Recent Labs  Lab 05/31/21 0835 06/01/21 0404  06/01/21 1325 06/01/21 1937 06/02/21 0357 06/02/21 1535 06/03/21 0500  AST 24  --  101*  --   --   --   --   ALT 33  --  78*  --   --   --   --   ALKPHOS 70  --  68  --   --   --   --   BILITOT 0.6  --  1.1  --   --   --   --   PROT 6.3*  --  4.7*  --   --   --   --   ALBUMIN 2.9*   < > 2.0* 2.1* 2.0* 2.1* 1.9*   < > = values in this interval not displayed.   Recent Labs  Lab 05/31/21 0835  LIPASE 29   No results for input(s): AMMONIA in the last 168 hours.  ABG    Component Value Date/Time   PHART 7.32 (L) 06/02/2021 0723   PCO2ART 40 06/02/2021 0723   PO2ART 110 (H) 06/02/2021 0723   HCO3 20.6 06/02/2021 0723  TCO2 22 06/24/2007 0045   ACIDBASEDEF 5.1 (H) 06/02/2021 0723   O2SAT 97.9 06/02/2021 0723     Coagulation Profile: Recent Labs  Lab 05/31/21 0835 06/01/21 1715  INR 1.3* 2.3*    Cardiac Enzymes: Recent Labs  Lab 05/31/21 1114  CKTOTAL 411*    HbA1C: Hemoglobin-A1c  Date/Time Value Ref Range Status  06/26/2007 06:40 AM   Final   17.6% HIGH (NOTE) Reference Intervals:  Diabetic Adult        <9.0 Healthy Adult         3.9 - 7.3 Current ADA guidelines recommend a treatment goal of <7.0% HgbA1c for diabetic patients, which corresponds to a <9.0% Glycohemoglobin result with this method.   Performed at:  [facility]               [place], [place]  [address]   Hemoglobin A1C  Date/Time Value Ref Range Status  08/05/2013 01:26 PM 12.9 (H) 4.2 - 6.3 % Final    Comment:    The American Diabetes Association recommends that a primary goal of therapy should be <7% and that physicians should reevaluate the treatment regimen in patients with HbA1c values consistently >8%.    Hgb A1c MFr Bld  Date/Time Value Ref Range Status  05/31/2021 12:05 PM 5.7 (H) 4.8 - 5.6 % Final    Comment:    RESULTS CONFIRMED BY MANUAL DILUTION REPEATED TO VERIFY (NOTE) Pre diabetes:          5.7%-6.4%  Diabetes:               >6.4%  Glycemic control for   <7.0% adults with diabetes   06/11/2020 03:41 AM 7.0 (H) 4.8 - 5.6 % Final    Comment:    (NOTE) Pre diabetes:          5.7%-6.4%  Diabetes:              >6.4%  Glycemic control for   <7.0% adults with diabetes     CBG: Recent Labs  Lab 06/02/21 2227 06/03/21 0004 06/03/21 0332 06/03/21 0728 06/03/21 1110  GLUCAP 84 109* 92 144* 121*    Review of Systems:   Unable to assess due to intubation, AMS, critical illness   Past Medical History:  He,  has a past medical history of Cannabinoid hyperemesis syndrome, Diabetes 1.5, managed as type 1 ([place]), Gastroparesis, and Perirectal abscess (06/16/2017).   Surgical History:   Past Surgical History:  Procedure Laterality Date   INCISION AND DRAINAGE PERIRECTAL ABSCESS N/A 06/16/2017   Procedure: IRRIGATION AND DEBRIDEMENT PERIRECTAL ABSCESS;  Surgeon: Florene Glen, MD;  Location: [facility] ORS;  Service: General;  Laterality: N/A;   none       Social History:   reports that he has been smoking cigarettes. He has been smoking an average of .5 packs per day. He has never used smokeless tobacco. He reports current drug use. Drug: Marijuana. He reports that he does not drink alcohol.   Family History:  His family history includes Diabetes in his mother.   Allergies No Known Allergies   Home Medications  Prior to Admission medications   Medication Sig Start Date End Date Taking? Authorizing Provider  ondansetron (ZOFRAN) 4 MG tablet Take 1 tablet (4 mg total) by mouth every 8 (eight) hours as needed for up to 10 doses for nausea or vomiting. 05/31/21  Yes Hulan Saas  P, MD  amLODipine (NORVASC) 10 MG tablet Take 1 tablet (10 mg total) by mouth daily. Patient not taking: Reported on 06/01/2021 06/14/20   Fritzi Mandes, MD  hydrALAZINE (APRESOLINE) 50 MG tablet Take 1 tablet (50 mg total) by mouth 2 (two) times daily. Patient not taking: Reported on 06/01/2021 06/13/20   Fritzi Mandes, MD  insulin  glargine (LANTUS) 100 UNIT/ML injection Inject 20 Units into the skin at bedtime. Patient not taking: Reported on 06/01/2021 08/26/20   [provider]  omeprazole (PRILOSEC) 40 MG capsule Take 1 capsule (40 mg total) by mouth daily. 03/08/20 05/07/20  Arta Silence, MD  pantoprazole (PROTONIX) 40 MG tablet Take 1 tablet (40 mg total) by mouth daily. Patient not taking: Reported on 12/10/2018 09/10/18 01/02/20  Hillary Bow, MD  sucralfate (CARAFATE) 1 g tablet Take 1 tablet (1 g total) by mouth 4 (four) times daily. Patient not taking: Reported on 06/26/2019 02/04/19 01/02/20  Nance Pear, MD     Critical care time: 25 minutes     Wrigley Plasencia Sharene Butters, MD 06/03/21 12:17 PM   [facility] Prefer epic messenger for cross cover needs If after hours, please call E-link

## 2021-06-03 NOTE — Plan of Care (Signed)

## 2021-06-03 NOTE — Progress Notes (Signed)
Patient extubated, per MD order, to 2L [place] with no complications. Saturations are 100% on the 2L [place].

## 2021-06-03 NOTE — Progress Notes (Signed)
[facility]  ROUNDING NOTE   Subjective:   CRRT continued No problems reported by nursing Neuro- sedated with precedex- off fentanyl Pulm - vent assisted; fio2 25% Cvs: pressors - nor epi Renal- low UOP; crrt continued   Objective:  Vital signs in last 24 hours:  Temp:  [96.8 F (36 C)-98.2 F (36.8 C)] 97 F (36.1 C) (01/21 1000) Pulse Rate:  [94-112] 94 (01/21 1000) Resp:  [16] 16 (01/20 2000) BP: (90-130)/(59-93) 107/79 (01/21 1000) SpO2:  [97 %-100 %] 100 % (01/21 1000) Arterial Line BP: (96-177)/(50-82) 111/66 (01/21 1000) FiO2 (%):  [24 %-30 %] 24 % (01/21 0725) Weight:  [72.7 kg] 72.7 kg (01/21 0409)  Weight change: -2.3 kg Filed Weights   05/31/21 0637 06/02/21 0424 06/03/21 0409  Weight: 65.8 kg 75 kg 72.7 kg    Intake/Output: I/O last 3 completed shifts: In: 6718 [I.V.:5053.4; Other:35; NG/GT:779.7; IV Piggyback:849.9] Out: 1287 [Urine:83; OMVEH:2094; Stool:2250]   Intake/Output this shift:  Total I/O In: 416.7 [I.V.:296; NG/GT:120.7] Out: 423 [Other:423]  Physical Exam: General: Critically ill  Eyes: Anicteric  Lungs:  Vent assisted  Heart: Regular rate and rhythm, tachycardic  Abdomen:  Soft,    Extremities: + peripheral edema.  Neurologic: Intubated.    Skin: No lesions  Access:  Right femoral temp HD catheter 06/01/21  Rectal tube in place  Basic Metabolic Panel: Recent Labs  Lab 06/01/21 0404 06/01/21 1325 06/01/21 1937 06/02/21 0357 06/02/21 1535 06/03/21 0307 06/03/21 0500  NA 128* 131* 133* 130* 133*  --  131*  K 4.3 3.6 4.1 3.1* 3.9  --  3.5  CL 101 98 96* 99 99  --  97*  CO2 14* 11* 15* 19* 20*  --  23  GLUCOSE 53* 104* 65* 95 96  --  146*  BUN 95* 88* 91* 58* 38*  --  23*  CREATININE 13.14* 12.78* 12.67* 7.70* 4.69*  --  2.85*  CALCIUM 6.3* 6.5* 6.6* 6.6* 8.0*  --  7.9*  MG  --   --   --  1.6*  --  1.8  --   PHOS 5.6*  --  7.6* 5.8* 5.1*  --  3.7     Liver Function Tests: Recent Labs  Lab 05/31/21 0835  06/01/21 0404 06/01/21 1325 06/01/21 1937 06/02/21 0357 06/02/21 1535 06/03/21 0500  AST 24  --  101*  --   --   --   --   ALT 33  --  78*  --   --   --   --   ALKPHOS 70  --  68  --   --   --   --   BILITOT 0.6  --  1.1  --   --   --   --   PROT 6.3*  --  4.7*  --   --   --   --   ALBUMIN 2.9*   < > 2.0* 2.1* 2.0* 2.1* 1.9*   < > = values in this interval not displayed.    Recent Labs  Lab 05/31/21 0835  LIPASE 29    No results for input(s): AMMONIA in the last 168 hours.  CBC: Recent Labs  Lab 05/31/21 0835 06/01/21 0404 06/01/21 1325 06/02/21 0357 06/03/21 0307  WBC 12.7* 4.5 4.9 8.6 25.9*  NEUTROABS 11.6*  --   --   --   --   HGB 5.2* 5.9* 7.7* 9.2* 9.2*  HCT 15.8* 17.4* 21.9* 25.4* 26.2*  MCV 89.8 85.7 87.6  84.1 86.2  PLT 172 124* 100* 76* 29*     Cardiac Enzymes: Recent Labs  Lab 05/31/21 1114  CKTOTAL 411*     BNP: Invalid input(s): POCBNP  CBG: Recent Labs  Lab 06/02/21 2159 06/02/21 2227 06/03/21 0004 06/03/21 0332 06/03/21 0728  GLUCAP 65* 84 109* 92 144*     Microbiology: Results for orders placed or performed during the hospital encounter of 05/31/21  Resp Panel by RT-PCR (Flu A&B, Covid) Nasopharyngeal Swab     Status: None   Collection Time: 05/31/21  6:58 AM   Specimen: Nasopharyngeal Swab; Nasopharyngeal(NP) swabs in vial transport medium  Result Value Ref Range Status   SARS Coronavirus 2 by RT PCR NEGATIVE NEGATIVE Final    Comment: (NOTE) SARS-CoV-2 target nucleic acids are NOT DETECTED.  The SARS-CoV-2 RNA is generally detectable in upper respiratory specimens during the acute phase of infection. The lowest concentration of SARS-CoV-2 viral copies this assay can detect is 138 copies/mL. A negative result does not preclude SARS-Cov-2 infection and should not be used as the sole basis for treatment or other patient management decisions. A negative result may occur with  improper specimen collection/handling, submission  of specimen other than nasopharyngeal swab, presence of viral mutation(s) within the areas targeted by this assay, and inadequate number of viral copies(<138 copies/mL). A negative result must be combined with clinical observations, patient history, and epidemiological information. The expected result is Negative.  Fact Sheet for Patients:  EntrepreneurPulse.com.au  Fact Sheet for Healthcare Providers:  IncredibleEmployment.be  This test is no t yet approved or cleared by the [place] FDA and  has been authorized for detection and/or diagnosis of SARS-CoV-2 by FDA under an Emergency Use Authorization (EUA). This EUA will remain  in effect (meaning this test can be used) for the duration of the COVID-19 declaration under Section 564(b)(1) of the Act, 21 U.S.C.section 360bbb-3(b)(1), unless the authorization is terminated  or revoked sooner.       Influenza A by PCR NEGATIVE NEGATIVE Final   Influenza B by PCR NEGATIVE NEGATIVE Final    Comment: (NOTE) The Xpert Xpress SARS-CoV-2/FLU/RSV plus assay is intended as an aid in the diagnosis of influenza from Nasopharyngeal swab specimens and should not be used as a sole basis for treatment. Nasal washings and aspirates are unacceptable for Xpert Xpress SARS-CoV-2/FLU/RSV testing.  Fact Sheet for Patients: EntrepreneurPulse.com.au  Fact Sheet for Healthcare Providers: IncredibleEmployment.be  This test is not yet approved or cleared by the [place] FDA and has been authorized for detection and/or diagnosis of SARS-CoV-2 by FDA under an Emergency Use Authorization (EUA). This EUA will remain in effect (meaning this test can be used) for the duration of the COVID-19 declaration under Section 564(b)(1) of the Act, 21 U.S.C. section 360bbb-3(b)(1), unless the authorization is terminated or revoked.  Performed at [facility], [place], [place] [address]   Respiratory (~20 pathogens) panel by PCR     Status: None   Collection Time: 05/31/21  6:58 AM   Specimen: Nasopharyngeal Swab; Respiratory  Result Value Ref Range Status   Adenovirus NOT DETECTED NOT DETECTED Final   Coronavirus 229E NOT DETECTED NOT DETECTED Final    Comment: (NOTE) The Coronavirus on the Respiratory Panel, DOES NOT test for the novel  Coronavirus (2019 nCoV)    Coronavirus HKU1 NOT DETECTED NOT DETECTED Final   Coronavirus NL63 NOT DETECTED NOT DETECTED Final   Coronavirus OC43 NOT DETECTED NOT DETECTED Final   Metapneumovirus  NOT DETECTED NOT DETECTED Final   Rhinovirus / Enterovirus NOT DETECTED NOT DETECTED Final   Influenza A NOT DETECTED NOT DETECTED Final   Influenza B NOT DETECTED NOT DETECTED Final   Parainfluenza Virus 1 NOT DETECTED NOT DETECTED Final   Parainfluenza Virus 2 NOT DETECTED NOT DETECTED Final   Parainfluenza Virus 3 NOT DETECTED NOT DETECTED Final   Parainfluenza Virus 4 NOT DETECTED NOT DETECTED Final   Respiratory Syncytial Virus NOT DETECTED NOT DETECTED Final   Bordetella pertussis NOT DETECTED NOT DETECTED Final   Bordetella Parapertussis NOT DETECTED NOT DETECTED Final   Chlamydophila pneumoniae NOT DETECTED NOT DETECTED Final   Mycoplasma pneumoniae NOT DETECTED NOT DETECTED Final    Comment: Performed at [facility], [place] [address], [place], [place] [address]  Group A Strep by PCR [facility] Only)     Status: None   Collection Time: 05/31/21  8:35 AM   Specimen: Throat; Sterile Swab  Result Value Ref Range Status   Group A Strep by PCR NOT DETECTED NOT DETECTED Final    Comment: Performed at [facility], [address] [place], [place] [address]  Blood culture (routine x 2)     Status: None (Preliminary result)   Collection Time: 05/31/21  8:39 AM   Specimen: BLOOD  Result Value Ref Range Status   Specimen Description BLOOD BLOOD RIGHT FOREARM  Final   Special Requests   Final     BOTTLES DRAWN AEROBIC AND ANAEROBIC Blood Culture results may not be optimal due to an excessive volume of blood received in culture bottles   Culture   Final    NO GROWTH 3 DAYS Performed at [facility], [address] [place], [place] [address]    Report Status PENDING  Incomplete  Blood culture (routine x 2)     Status: None (Preliminary result)   Collection Time: 05/31/21  8:39 AM   Specimen: BLOOD  Result Value Ref Range Status   Specimen Description BLOOD RIGHT ANTECUBITAL  Final   Special Requests   Final    BOTTLES DRAWN AEROBIC AND ANAEROBIC Blood Culture results may not be optimal due to an excessive volume of blood received in culture bottles   Culture   Final    NO GROWTH 3 DAYS Performed at [facility], [address], [place], [place] [address]    Report Status PENDING  Incomplete  C Difficile Quick Screen w PCR reflex     Status: None   Collection Time: 05/31/21  1:20 PM   Specimen: Stool  Result Value Ref Range Status   C Diff antigen NEGATIVE NEGATIVE Final   C Diff toxin NEGATIVE NEGATIVE Final   C Diff interpretation No C. difficile detected.  Final    Comment: Performed at [facility], [address] [place],  [address]  Gastrointestinal Panel by PCR , Stool     Status: None   Collection Time: 05/31/21  1:30 PM   Specimen: Stool  Result Value Ref Range Status   Campylobacter species NOT DETECTED NOT DETECTED Final   Plesimonas shigelloides NOT DETECTED NOT DETECTED Final   Salmonella species NOT DETECTED NOT DETECTED Final   Yersinia enterocolitica NOT DETECTED NOT DETECTED Final   Vibrio species NOT DETECTED NOT DETECTED Final   Vibrio cholerae NOT DETECTED NOT DETECTED Final   Enteroaggregative E coli (EAEC) NOT DETECTED NOT DETECTED Final   Enteropathogenic E coli (EPEC) NOT DETECTED NOT DETECTED Final   Enterotoxigenic E  coli (ETEC) NOT DETECTED NOT DETECTED Final   Shiga like toxin producing E coli (STEC) NOT  DETECTED NOT DETECTED Final   Shigella/Enteroinvasive E coli (EIEC) NOT DETECTED NOT DETECTED Final   Cryptosporidium NOT DETECTED NOT DETECTED Final   Cyclospora cayetanensis NOT DETECTED NOT DETECTED Final   Entamoeba histolytica NOT DETECTED NOT DETECTED Final   Giardia lamblia NOT DETECTED NOT DETECTED Final   Adenovirus F40/41 NOT DETECTED NOT DETECTED Final   Astrovirus NOT DETECTED NOT DETECTED Final   Norovirus GI/GII NOT DETECTED NOT DETECTED Final   Rotavirus A NOT DETECTED NOT DETECTED Final   Sapovirus (I, II, IV, and V) NOT DETECTED NOT DETECTED Final    Comment: Performed at [facility], [address] [place], [place] [address]  Urine Culture     Status: None   Collection Time: 06/01/21 10:23 AM   Specimen: In/Out Cath Urine  Result Value Ref Range Status   Specimen Description   Final    IN/OUT CATH URINE Performed at [facility], [address] [place], [place] [address]    Special Requests   Final    NONE Performed at [facility], [address] [place], [place] [address]    Culture   Final    NO GROWTH Performed at [facility], [place] [address], [place], [place] [address]    Report Status 06/03/2021 FINAL  Final  Culture, Respiratory w Gram Stain     Status: None (Preliminary result)   Collection Time: 06/01/21  3:59 PM   Specimen: Tracheal Aspirate; Respiratory  Result Value Ref Range Status   Specimen Description   Final    TRACHEAL ASPIRATE Performed at [facility], [address] [place], [place] [address]    Special Requests   Final    NONE Performed at [facility], [address] [place], [place] [address]    Gram Stain   Final    RARE SQUAMOUS EPITHELIAL CELLS PRESENT MODERATE WBC PRESENT,BOTH PMN AND MONONUCLEAR MODERATE GRAM POSITIVE COCCI FEW GRAM NEGATIVE RODS FEW GRAM POSITIVE RODS RARE YEAST    Culture   Final    TOO YOUNG TO READ Performed at [facility],  [place] [address] [place], [place] [address]    Report Status PENDING  Incomplete  CULTURE, BLOOD (ROUTINE X 2) w Reflex to ID Panel     Status: None (Preliminary result)   Collection Time: 06/01/21  7:06 PM   Specimen: BLOOD  Result Value Ref Range Status   Specimen Description BLOOD RIGHT ANTECUBITAL  Final   Special Requests   Final    BOTTLES DRAWN AEROBIC ONLY Blood Culture results may not be optimal due to an inadequate volume of blood received in culture bottles   Culture   Final    NO GROWTH 2 DAYS Performed at [facility], [address], [place], [place] [address]    Report Status PENDING  Incomplete  CULTURE, BLOOD (ROUTINE X 2) w Reflex to ID Panel     Status: None (Preliminary result)   Collection Time: 06/01/21  7:37 PM   Specimen: BLOOD  Result Value Ref Range Status   Specimen Description BLOOD [facility]  Final   Special Requests BOTTLES DRAWN AEROBIC AND ANAEROBIC BCLV  Final   Culture   Final    NO GROWTH 2 DAYS Performed at [facility], [address], [place],  [address]    Report Status PENDING  Incomplete  MRSA  Next Gen by PCR, Nasal     Status: None   Collection Time: 06/02/21 10:46 AM   Specimen: Nasal Mucosa; Nasal Swab  Result Value Ref Range Status   MRSA by PCR Next Gen NOT DETECTED NOT DETECTED Final    Comment: (NOTE) The GeneXpert MRSA Assay (FDA approved for NASAL specimens only), is one component of a comprehensive MRSA colonization surveillance program. It is not intended to diagnose MRSA infection nor to guide or monitor treatment for MRSA infections. Test performance is not FDA approved in patients less than 36 years old. Performed at [facility], [address] [place], [place] [address]     Coagulation Studies: Recent Labs    06/01/21 1715  LABPROT 25.0*  INR 2.3*     Urinalysis: Recent Labs    06/01/21 1023  COLORURINE AMBER*  LABSPEC 1.017  PHURINE 6.0  GLUCOSEU 50*  HGBUR MODERATE*   BILIRUBINUR NEGATIVE  KETONESUR NEGATIVE  PROTEINUR >=300*  NITRITE NEGATIVE  LEUKOCYTESUR LARGE*       Imaging: DG Abd 1 View  Result Date: 06/01/2021 CLINICAL DATA:  Encounter for feeding tube placement EXAM: ABDOMEN - 1 VIEW COMPARISON:  X-ray abdomen 06/11/2020. FINDINGS: Enteric tube coursing below the hemidiaphragm with tip and side port overlying the gastric lumen. Tip likely in the region of the pylorus/first portion of the duodenum. Bilateral femoral approach catheters noted with left catheter overlying the L5 vertebral body and right catheter with tip overlying the right aspect of the L1 vertebral body. Nonobstructive bowel gas pattern. Slight distension of the small bowel with gas. No radio-opaque calculi or other significant radiographic abnormality are seen. IMPRESSION: Enteric tube coursing below within the gastric lumen with tip overlying the expected region of the pylorus. Consider retracting by 3 cm. Electronically Signed   By: Iven Finn M.D.   On: 06/01/2021 18:11   CT HEAD WO CONTRAST (5MM)  Result Date: 06/01/2021 CLINICAL DATA:  Altered mental status EXAM: CT HEAD WITHOUT CONTRAST TECHNIQUE: Contiguous axial images were obtained from the base of the skull through the vertex without intravenous contrast. RADIATION DOSE REDUCTION: This exam was performed according to the departmental dose-optimization program which includes automated exposure control, adjustment of the mA and/or kV according to patient size and/or use of iterative reconstruction technique. COMPARISON:  05/21/2016 FINDINGS: Brain: No acute intracranial findings are seen. There are no signs of bleeding within the cranium. Ventricles are not dilated. There is no shift of midline structures. There are no epidural or subdural fluid collections. Vascular: Unremarkable. Skull: Unremarkable. Sinuses/Orbits: Air-fluid level is seen in the sphenoid sinus. There is mucosal thickening in the ethmoid and maxillary  sinuses. Small air-fluid levels are seen in both maxillary sinuses. Other: None IMPRESSION: No acute intracranial findings are seen in noncontrast CT brain. Chronic sinusitis. Air-fluid levels are seen in the sphenoid and maxillary sinuses suggesting possible acute sinusitis. Electronically Signed   By: Elmer Picker M.D.   On: 06/01/2021 17:03   DG Chest Port 1 View  Result Date: 06/02/2021 CLINICAL DATA:  Acute respiratory failure, hypoxia EXAM: PORTABLE CHEST 1 VIEW COMPARISON:  Chest radiograph 1 day prior FINDINGS: The endotracheal tube is approximately 2.6 cm from the carina. The enteric catheter tip is off the field of view. A presumed esophageal temperature probe terminates in the upper thorax. The cardiac silhouette is enlarged, unchanged. The mediastinal contours are stable. There are patchy opacities in the lung bases, particularly on the right, likely not significantly changed in the interim. There  is no significant pleural effusion. There is no pneumothorax. There is no acute osseous abnormality. IMPRESSION: 1. Unchanged enlargement of the cardiac silhouette. 2. Patchy bibasilar opacities are not significantly changed and may reflect atelectasis or developing infection. 3. Presumed esophageal temperature probe terminating in the upper thorax. Endotracheal tube in satisfactory position. Enteric catheter tip is off the field of view. Electronically Signed   By: Valetta Mole M.D.   On: 06/02/2021 07:25   DG Chest Port 1 View  Result Date: 06/01/2021 CLINICAL DATA:  Intubation. EXAM: PORTABLE CHEST 1 VIEW COMPARISON:  Chest radiographs and CT 05/31/2021 FINDINGS: An endotracheal tube has been placed and terminates proximally 2.5 cm above the carina. Telemetry leads overlie the chest. The cardiac silhouette is accentuated by portable AP technique although true mild interval enlargement, such as from a pericardial effusion, is not excluded. Lung volumes are lower than on the prior radiographs,  and there are mild opacities in both lung bases. No sizable pleural effusion or pneumothorax is identified. IMPRESSION: 1. Endotracheal tube in appropriate position. 2. Low lung volumes with mild bibasilar opacities which may reflect atelectasis or infection. Electronically Signed   By: Logan Bores M.D.   On: 06/01/2021 13:56   ECHOCARDIOGRAM COMPLETE  Result Date: 06/01/2021    ECHOCARDIOGRAM REPORT   Patient Name:   Jonathan Mcmillan Date of Exam: 06/01/2021 Medical Rec #:  [id number]       Height:       68.0 in Accession #:    [id number]      Weight:       145.0 lb Date of Birth:  [birth year]       BSA:          1.783 m Patient Age:    25 years        BP:           155/83 mmHg Patient Gender: M               HR:           95 bpm. Exam Location:  [facility] Procedure: 2D Echo, Color Doppler, Cardiac Doppler and Strain Analysis Indications:     I46.9 Cardiac arrest  History:         Patient has no prior history of Echocardiogram examinations.                  ESRD, stage IV, Signs/Symptoms:Fever; Risk Factors:Diabetes.  Sonographer:     Charmayne Sheer Referring Phys:  [id number] Bradly Bienenstock Diagnosing Phys: Ida Rogue MD  Sonographer Comments: Echo performed with patient supine and on artificial respirator. Global longitudinal strain was attempted. IMPRESSIONS  1. Left ventricular ejection fraction, by estimation, is 35 to 40%. The left ventricle has moderately decreased function. The left ventricle demonstrates global hypokinesis. There is moderate left ventricular hypertrophy. Left ventricular diastolic parameters are consistent with Grade II diastolic dysfunction (pseudonormalization). The average left ventricular global longitudinal strain is -11.3 %. The global longitudinal strain is abnormal.  2. Right ventricular systolic function is mildly reduced. The right ventricular size is normal. There is moderately elevated pulmonary artery systolic pressure. The estimated right ventricular systolic pressure is 40.0  mmHg.  3. Left atrial size was mildly dilated.  4. The mitral valve is normal in structure. Mild mitral valve regurgitation. No evidence of mitral stenosis.  5. The aortic valve is normal in structure. Aortic valve regurgitation is mild. No aortic stenosis is present.  6. The inferior vena cava is dilated  in size with <50% respiratory variability, suggesting right atrial pressure of 15 mmHg. FINDINGS  Left Ventricle: Left ventricular ejection fraction, by estimation, is 35 to 40%. The left ventricle has moderately decreased function. The left ventricle demonstrates global hypokinesis. The average left ventricular global longitudinal strain is -11.3 %. The global longitudinal strain is abnormal. The left ventricular internal cavity size was normal in size. There is moderate left ventricular hypertrophy. Left ventricular diastolic parameters are consistent with Grade II diastolic dysfunction (pseudonormalization). Right Ventricle: The right ventricular size is normal. No increase in right ventricular wall thickness. Right ventricular systolic function is mildly reduced. There is moderately elevated pulmonary artery systolic pressure. The tricuspid regurgitant velocity is 2.80 m/s, and with an assumed right atrial pressure of 15 mmHg, the estimated right ventricular systolic pressure is 81.8 mmHg. Left Atrium: Left atrial size was mildly dilated. Right Atrium: Right atrial size was normal in size. Pericardium: There is no evidence of pericardial effusion. Mitral Valve: The mitral valve is normal in structure. Mild mitral valve regurgitation. No evidence of mitral valve stenosis. MV peak gradient, 5.4 mmHg. The mean mitral valve gradient is 3.0 mmHg. Tricuspid Valve: The tricuspid valve is normal in structure. Tricuspid valve regurgitation is mild . No evidence of tricuspid stenosis. Aortic Valve: The aortic valve is normal in structure. Aortic valve regurgitation is mild. No aortic stenosis is present. Aortic valve  mean gradient measures 6.0 mmHg. Aortic valve peak gradient measures 10.1 mmHg. Aortic valve area, by VTI measures 2.64  cm. Pulmonic Valve: The pulmonic valve was normal in structure. Pulmonic valve regurgitation is trivial. No evidence of pulmonic stenosis. Aorta: The aortic root is normal in size and structure. Venous: The inferior vena cava is dilated in size with less than 50% respiratory variability, suggesting right atrial pressure of 15 mmHg. IAS/Shunts: No atrial level shunt detected by color flow Doppler.  LEFT VENTRICLE PLAX 2D LVIDd:         4.90 cm   Diastology LVIDs:         3.79 cm   LV e' medial:    8.59 cm/s LV PW:         1.43 cm   LV E/e' medial:  14.4 LV IVS:        0.93 cm   LV e' lateral:   9.46 cm/s LVOT diam:     2.10 cm   LV E/e' lateral: 13.1 LV SV:         58 LV SV Index:   33        2D Longitudinal Strain LVOT Area:     3.46 cm  2D Strain GLS Avg:     -11.3 %  RIGHT VENTRICLE RV Basal diam:  3.95 cm RV S prime:     11.40 cm/s LEFT ATRIUM             Index        RIGHT ATRIUM           Index LA diam:        4.40 cm 2.47 cm/m   RA Area:     20.10 cm LA Vol (A2C):   77.6 ml 43.53 ml/m  RA Volume:   60.00 ml  33.66 ml/m LA Vol (A4C):   73.9 ml 41.45 ml/m LA Biplane Vol: 79.4 ml 44.54 ml/m  AORTIC VALVE                     PULMONIC VALVE AV Area (Vmax):  2.40 cm      PV Vmax:       1.12 m/s AV Area (Vmean):   2.23 cm      PV Vmean:      82.500 cm/s AV Area (VTI):     2.64 cm      PV VTI:        0.187 m AV Vmax:           159.00 cm/s   PV Peak grad:  5.0 mmHg AV Vmean:          113.000 cm/s  PV Mean grad:  3.0 mmHg AV VTI:            0.220 m AV Peak Grad:      10.1 mmHg AV Mean Grad:      6.0 mmHg LVOT Vmax:         110.00 cm/s LVOT Vmean:        72.600 cm/s LVOT VTI:          0.168 m LVOT/AV VTI ratio: 0.76  AORTA Ao Root diam: 2.80 cm MITRAL VALVE                TRICUSPID VALVE MV Area (PHT): 5.16 cm     TR Peak grad:   31.4 mmHg MV Area VTI:   2.47 cm     TR Vmax:         280.00 cm/s MV Peak grad:  5.4 mmHg MV Mean grad:  3.0 mmHg     SHUNTS MV Vmax:       1.16 m/s     Systemic VTI:  0.17 m MV Vmean:      77.4 cm/s    Systemic Diam: 2.10 cm MV Decel Time: 147 msec MV E velocity: 124.00 cm/s MV A velocity: 54.40 cm/s MV E/A ratio:  2.28 Ida Rogue MD Electronically signed by Ida Rogue MD Signature Date/Time: 06/01/2021/6:41:43 PM    Final      Medications:    dexmedetomidine (PRECEDEX) IV infusion 1 mcg/kg/hr (06/03/21 1000)   dextrose 40 mL/hr at 06/03/21 1000   feeding supplement (VITAL 1.5 CAL) 40 mL/hr at 06/03/21 0700   fentaNYL infusion INTRAVENOUS Stopped (06/03/21 0850)   norepinephrine (LEVOPHED) Adult infusion 10 mcg/min (06/03/21 1000)   piperacillin-tazobactam Stopped (06/03/21 0548)   prismasol BGK 2/2.5 dialysis solution 2,000 mL/hr at 06/03/21 0736   prismasol BGK 2/2.5 replacement solution 500 mL/hr at 06/03/21 0505   prismasol BGK 2/2.5 replacement solution 500 mL/hr at 06/03/21 0505   vancomycin Stopped (06/02/21 1703)    chlorhexidine gluconate (MEDLINE KIT)  15 mL Mouth Rinse BID   Chlorhexidine Gluconate Cloth  6 each Topical Daily   docusate  100 mg Per Tube BID   feeding supplement (PROSource TF)  45 mL Per Tube TID   free water  30 mL Per Tube Q4H   hydrocortisone sod succinate (SOLU-CORTEF) inj  100 mg Intravenous Q12H   mouth rinse  15 mL Mouth Rinse 10 times per day   multivitamin  1 tablet Per Tube QHS   nutrition supplement (JUVEN)  1 packet Per Tube BID BM   pantoprazole (PROTONIX) IV  40 mg Intravenous Daily   polyethylene glycol  17 g Per Tube Daily   fentaNYL, guaiFENesin-dextromethorphan, heparin, midazolam, ondansetron **OR** ondansetron (ZOFRAN) IV, oxyCODONE, sodium chloride  Assessment/ Plan:  Mr. TATUM MASSMAN is a 26 y.o. black male with hypertension, insulin dependent diabetes mellitus type I, diabetic gastroparesis, diabetic neuropathy, tobacco use, THC  use, right toe amputation, perirectal abscess, who  is admitted to [facility] on 05/31/2021 for SIRS (systemic inflammatory response syndrome) (HCC) [R65.10] AKI (acute kidney injury) ([place]) [N17.9] Symptomatic anemia [D64.9] Acute renal failure superimposed on stage 5 chronic kidney disease, not on chronic dialysis, unspecified acute renal failure type ([place]) [N17.9, N18.5] Hypertension, unspecified type [I10] Acute cough [R05.1]  Cardiac arrest with code blue 06/01/2021 at 10 am. Transferred to ICU. Intubated  Acute kidney injury on chronic kidney disease stage IV versus progression of chronic kidney disease to end stage renal disease. Baseline creatinine of 4.13, GFR of 19 on 08/26/20. History of nephrotic range proteinuria and hematuria. No history of renal biopsy. Strong family history of dialysis with mother with ESRD before passing.  - remains hemodynamically unstable.  - Continue CVVHD with net even Pre filter and post filter replacement 585m/hr Dialysate 2000 ml/hr Net Fluid removal rate 0 ml/hr   Hyponatremia: secondary to renal failure. Renal replacement as above.  Anemia of chronic kidney disease and anemia of iron deficiency. Status post multiple transfusions  Hypotension with cardiogenic shock.  - requiring vasopressors.   5. Acute respiratory failure  Vent assisted  Extubation planned for later today   LOS: 3 Reon Hunley 1/21/202310:20 AM

## 2021-06-03 NOTE — Procedures (Addendum)
Intubation Procedure Note  Jonathan Mcmillan  [id number]  [birth year]  Date:06/03/21  Time:11:02 PM   Provider Performing:Cherokee Clowers A Raveena Hebdon    Procedure: Intubation ([id number])  Indication(s) Respiratory Failure  Consent Unable to obtain consent due to emergent nature of procedure.  Anesthesia Etomidate, Fentanyl, and Rocuronium  Time Out Verified patient identification, verified procedure, site/side was marked, verified correct patient position, special equipment/implants available, medications/allergies/relevant history reviewed, required imaging and test results available.  Sterile Technique Usual hand hygeine, masks, and gloves were used  Procedure Description Patient positioned in bed supine.  Sedation given as noted above.  Patient was intubated with endotracheal tube using Glidescope.  View was Grade 1 full glottis .  Number of attempts was 1.  Colorimetric CO2 detector was consistent with tracheal placement.  Complications/Tolerance None; patient tolerated the procedure well. Chest X-ray is ordered to verify placement.  EBL Minimal  Specimen(s) None    Rufina Falco, DNP, CCRN, FNP-C, AGACNP-BC Acute Care Nurse Practitioner  [facility] Pager: [phone] [place] at [facility]

## 2021-06-03 NOTE — Progress Notes (Incomplete)
Pati

## 2021-06-03 NOTE — Progress Notes (Signed)
Pt awoke from sedation this morning agitated and attempting to pull out ET tube, he was Sinus Tach at 117, and BP in the 225J systolic. Levophed was turned off and he was able to nod and gesture, as well as follow commands. 2 bolus of fentanyl were given with pt still agitated. 2mg  of versed was given and pt calmed down with HR returning back to 103, BP went down to a MAP of 58 and levophed was restarted. Currently pt has a RASS of -1 and levophed is going at 13 mcg. CRRT is running with no complications. Pt did require 12.5 g of D50 at 2211 earlier for a BG of 65. Last BG was 92 at 0332.

## 2021-06-04 ENCOUNTER — Inpatient Hospital Stay: Payer: Medicaid Other

## 2021-06-04 LAB — CBC
HCT: 25.1 % — ABNORMAL LOW (ref 39.0–52.0)
HCT: 24.6 % — ABNORMAL LOW (ref 39.0–52.0)
HCT: 24.8 % — ABNORMAL LOW (ref 39.0–52.0)
Hemoglobin: 9 g/dL — ABNORMAL LOW (ref 13.0–17.0)
Hemoglobin: 8.8 g/dL — ABNORMAL LOW (ref 13.0–17.0)
Hemoglobin: 9 g/dL — ABNORMAL LOW (ref 13.0–17.0)
MCH: 30.6 pg (ref 26.0–34.0)
MCH: 30.3 pg (ref 26.0–34.0)
MCH: 30.5 pg (ref 26.0–34.0)
MCHC: 35.9 g/dL (ref 30.0–36.0)
MCHC: 35.8 g/dL (ref 30.0–36.0)
MCHC: 36.3 g/dL — ABNORMAL HIGH (ref 30.0–36.0)
MCV: 85.4 fL (ref 80.0–100.0)
MCV: 84.8 fL (ref 80.0–100.0)
MCV: 84.1 fL (ref 80.0–100.0)
Platelets: 14 10*3/uL — CL (ref 150–400)
Platelets: 10 10*3/uL — CL (ref 150–400)
Platelets: 13 10*3/uL — CL (ref 150–400)
RBC: 2.94 MIL/uL — ABNORMAL LOW (ref 4.22–5.81)
RBC: 2.9 MIL/uL — ABNORMAL LOW (ref 4.22–5.81)
RBC: 2.95 MIL/uL — ABNORMAL LOW (ref 4.22–5.81)
RDW: 14.4 % (ref 11.5–15.5)
RDW: 14.4 % (ref 11.5–15.5)
RDW: 14.2 % (ref 11.5–15.5)
WBC: 22.5 10*3/uL — ABNORMAL HIGH (ref 4.0–10.5)
WBC: 25.2 10*3/uL — ABNORMAL HIGH (ref 4.0–10.5)
WBC: 21.2 10*3/uL — ABNORMAL HIGH (ref 4.0–10.5)
nRBC: 0 % (ref 0.0–0.2)
nRBC: 0 % (ref 0.0–0.2)

## 2021-06-04 LAB — RENAL FUNCTION PANEL
Albumin: 1.8 g/dL — ABNORMAL LOW (ref 3.5–5.0)
Albumin: 1.7 g/dL — ABNORMAL LOW (ref 3.5–5.0)
Anion gap: 10 (ref 5–15)
Anion gap: 8 (ref 5–15)
BUN: 15 mg/dL (ref 6–20)
BUN: 15 mg/dL (ref 6–20)
CO2: 23 mmol/L (ref 22–32)
CO2: 23 mmol/L (ref 22–32)
Calcium: 7.9 mg/dL — ABNORMAL LOW (ref 8.9–10.3)
Calcium: 7.7 mg/dL — ABNORMAL LOW (ref 8.9–10.3)
Chloride: 101 mmol/L (ref 98–111)
Chloride: 103 mmol/L (ref 98–111)
Creatinine, Ser: 1.68 mg/dL — ABNORMAL HIGH (ref 0.61–1.24)
Creatinine, Ser: 1.52 mg/dL — ABNORMAL HIGH (ref 0.61–1.24)
GFR, Estimated: 57 mL/min — ABNORMAL LOW (ref >60)
GFR, Estimated: >60 mL/min (ref >60)
Glucose, Bld: 154 mg/dL — ABNORMAL HIGH (ref 70–99)
Glucose, Bld: 193 mg/dL — ABNORMAL HIGH (ref 70–99)
Phosphorus: 1.5 mg/dL — ABNORMAL LOW (ref 2.5–4.6)
Phosphorus: 1.6 mg/dL — ABNORMAL LOW (ref 2.5–4.6)
Potassium: 3.1 mmol/L — ABNORMAL LOW (ref 3.5–5.1)
Potassium: 3.4 mmol/L — ABNORMAL LOW (ref 3.5–5.1)
Sodium: 134 mmol/L — ABNORMAL LOW (ref 135–145)
Sodium: 134 mmol/L — ABNORMAL LOW (ref 135–145)

## 2021-06-04 LAB — CBC WITH DIFFERENTIAL/PLATELET
Abs Immature Granulocytes: 3.56 10*3/uL — ABNORMAL HIGH (ref 0.00–0.07)
Basophils Absolute: 0 10*3/uL (ref 0.0–0.1)
Basophils Absolute: 0 10*3/uL (ref 0.0–0.1)
Basophils Relative: 0 %
Basophils Relative: 0 %
Eosinophils Absolute: 0.5 10*3/uL (ref 0.0–0.5)
Eosinophils Absolute: 0.3 10*3/uL (ref 0.0–0.5)
Eosinophils Relative: 2 %
Eosinophils Relative: 1 %
HCT: 25.4 % — ABNORMAL LOW (ref 39.0–52.0)
HCT: 24.8 % — ABNORMAL LOW (ref 39.0–52.0)
Hemoglobin: 8.9 g/dL — ABNORMAL LOW (ref 13.0–17.0)
Hemoglobin: 8.7 g/dL — ABNORMAL LOW (ref 13.0–17.0)
Immature Granulocytes: 15 %
Lymphocytes Relative: 3 %
Lymphocytes Relative: 10 %
Lymphs Abs: 0.6 10*3/uL — ABNORMAL LOW (ref 0.7–4.0)
Lymphs Abs: 1.4 10*3/uL (ref 0.7–4.0)
MCH: 29.8 pg (ref 26.0–34.0)
MCH: 30.2 pg (ref 26.0–34.0)
MCHC: 35 g/dL (ref 30.0–36.0)
MCHC: 35.1 g/dL (ref 30.0–36.0)
MCV: 84.9 fL (ref 80.0–100.0)
MCV: 86.1 fL (ref 80.0–100.0)
Monocytes Absolute: 0.2 10*3/uL (ref 0.1–1.0)
Monocytes Absolute: 0.3 10*3/uL (ref 0.1–1.0)
Monocytes Relative: 1 %
Monocytes Relative: 1 %
Neutro Abs: 19.3 10*3/uL — ABNORMAL HIGH (ref 1.7–7.7)
Neutro Abs: 21.9 10*3/uL — ABNORMAL HIGH (ref 1.7–7.7)
Neutrophils Relative %: 79 %
Neutrophils Relative %: 80 %
Platelets: 10 10*3/uL — CL (ref 150–400)
Platelets: 29 10*3/uL — CL (ref 150–400)
RBC: 2.99 MIL/uL — ABNORMAL LOW (ref 4.22–5.81)
RBC: 2.88 MIL/uL — ABNORMAL LOW (ref 4.22–5.81)
RDW: 14.3 % (ref 11.5–15.5)
RDW: 14.6 % (ref 11.5–15.5)
Smear Review: DECREASED
WBC: 24.1 10*3/uL — ABNORMAL HIGH (ref 4.0–10.5)
WBC: 29.3 10*3/uL — ABNORMAL HIGH (ref 4.0–10.5)
nRBC: 0 % (ref 0.0–0.2)
nRBC: 0 /100 WBC

## 2021-06-04 LAB — BLOOD GAS, ARTERIAL
Acid-base deficit: 0.7 mmol/L (ref 0.0–2.0)
Acid-base deficit: 3 mmol/L — ABNORMAL HIGH (ref 0.0–2.0)
Bicarbonate: 23.6 mmol/L (ref 20.0–28.0)
Bicarbonate: 24.2 mmol/L (ref 20.0–28.0)
FIO2: 40
FIO2: 50
MECHVT: 450 mL
MECHVT: 450 mL
Mechanical Rate: 16
Mechanical Rate: 20
O2 Saturation: 95.4 %
O2 Saturation: 98.2 %
PEEP: 5 cmH2O
PEEP: 5 cmH2O
Patient temperature: 37
Patient temperature: 37
pCO2 arterial: 40 mmHg (ref 32.0–48.0)
pCO2 arterial: 49 mmHg — ABNORMAL HIGH (ref 32.0–48.0)
pH, Arterial: 7.29 — ABNORMAL LOW (ref 7.350–7.450)
pH, Arterial: 7.39 (ref 7.350–7.450)
pO2, Arterial: 109 mmHg — ABNORMAL HIGH (ref 83.0–108.0)
pO2, Arterial: 87 mmHg (ref 83.0–108.0)

## 2021-06-04 LAB — BASIC METABOLIC PANEL
Anion gap: 8 (ref 5–15)
BUN: 15 mg/dL (ref 6–20)
CO2: 22 mmol/L (ref 22–32)
Calcium: 7.9 mg/dL — ABNORMAL LOW (ref 8.9–10.3)
Chloride: 103 mmol/L (ref 98–111)
Creatinine, Ser: 1.54 mg/dL — ABNORMAL HIGH (ref 0.61–1.24)
GFR, Estimated: >60 mL/min (ref >60)
Glucose, Bld: 175 mg/dL — ABNORMAL HIGH (ref 70–99)
Potassium: 3.2 mmol/L — ABNORMAL LOW (ref 3.5–5.1)
Sodium: 133 mmol/L — ABNORMAL LOW (ref 135–145)

## 2021-06-04 LAB — PROTIME-INR
INR: 1.3 — ABNORMAL HIGH (ref 0.8–1.2)
Prothrombin Time: 16.6 seconds — ABNORMAL HIGH (ref 11.4–15.2)

## 2021-06-04 LAB — COMPREHENSIVE METABOLIC PANEL
ALT: 39 U/L (ref 0–44)
AST: 18 U/L (ref 15–41)
Albumin: 1.8 g/dL — ABNORMAL LOW (ref 3.5–5.0)
Alkaline Phosphatase: 89 U/L (ref 38–126)
Anion gap: 9 (ref 5–15)
BUN: 17 mg/dL (ref 6–20)
CO2: 24 mmol/L (ref 22–32)
Calcium: 8.1 mg/dL — ABNORMAL LOW (ref 8.9–10.3)
Chloride: 101 mmol/L (ref 98–111)
Creatinine, Ser: 1.81 mg/dL — ABNORMAL HIGH (ref 0.61–1.24)
GFR, Estimated: 52 mL/min — ABNORMAL LOW (ref >60)
Glucose, Bld: 134 mg/dL — ABNORMAL HIGH (ref 70–99)
Potassium: 2.9 mmol/L — ABNORMAL LOW (ref 3.5–5.1)
Sodium: 134 mmol/L — ABNORMAL LOW (ref 135–145)
Total Bilirubin: 2.3 mg/dL — ABNORMAL HIGH (ref 0.3–1.2)
Total Protein: 4.6 g/dL — ABNORMAL LOW (ref 6.5–8.1)

## 2021-06-04 LAB — STREP PNEUMONIAE URINARY ANTIGEN: Strep Pneumo Urinary Antigen: NEGATIVE

## 2021-06-04 LAB — RETIC PANEL
Immature Retic Fract: 7.5 % (ref 2.3–15.9)
RBC.: 3.05 MIL/uL — ABNORMAL LOW (ref 4.22–5.81)
Retic Count, Absolute: 13.7 10*3/uL — ABNORMAL LOW (ref 19.0–186.0)
Retic Ct Pct: 0.5 % (ref 0.4–3.1)
Reticulocyte Hemoglobin: 29.2 pg (ref >27.9)

## 2021-06-04 LAB — TECHNOLOGIST SMEAR REVIEW: Plt Morphology: DECREASED

## 2021-06-04 LAB — GLUCOSE, CAPILLARY
Glucose-Capillary: 117 mg/dL — ABNORMAL HIGH (ref 70–99)
Glucose-Capillary: 132 mg/dL — ABNORMAL HIGH (ref 70–99)
Glucose-Capillary: 131 mg/dL — ABNORMAL HIGH (ref 70–99)
Glucose-Capillary: 160 mg/dL — ABNORMAL HIGH (ref 70–99)
Glucose-Capillary: 155 mg/dL — ABNORMAL HIGH (ref 70–99)
Glucose-Capillary: 170 mg/dL — ABNORMAL HIGH (ref 70–99)
Glucose-Capillary: 154 mg/dL — ABNORMAL HIGH (ref 70–99)
Glucose-Capillary: 186 mg/dL — ABNORMAL HIGH (ref 70–99)

## 2021-06-04 LAB — D-DIMER, QUANTITATIVE: D-Dimer, Quant: 14.96 ug/mL-FEU — ABNORMAL HIGH (ref 0.00–0.50)

## 2021-06-04 LAB — HAPTOGLOBIN: Haptoglobin: 73 mg/dL (ref 17–317)

## 2021-06-04 LAB — LACTIC ACID, PLASMA
Lactic Acid, Venous: 2.8 mmol/L (ref 0.5–1.9)
Lactic Acid, Venous: 2.8 mmol/L (ref 0.5–1.9)
Lactic Acid, Venous: 3.3 mmol/L (ref 0.5–1.9)

## 2021-06-04 LAB — IMMATURE PLATELET FRACTION: Immature Platelet Fraction: 41.4 % — ABNORMAL HIGH (ref 1.2–8.6)

## 2021-06-04 LAB — TROPONIN I (HIGH SENSITIVITY)
Troponin I (High Sensitivity): 142 ng/L (ref <18)
Troponin I (High Sensitivity): 116 ng/L (ref <18)

## 2021-06-04 LAB — VITAMIN B12: Vitamin B-12: 4519 pg/mL — ABNORMAL HIGH (ref 180–914)

## 2021-06-04 LAB — LACTATE DEHYDROGENASE: LDH: 136 U/L (ref 98–192)

## 2021-06-04 LAB — MAGNESIUM: Magnesium: 1.9 mg/dL (ref 1.7–2.4)

## 2021-06-04 LAB — FOLATE: Folate: 12.7 ng/mL (ref >5.9)

## 2021-06-04 LAB — BRAIN NATRIURETIC PEPTIDE: B Natriuretic Peptide: 3692.3 pg/mL — ABNORMAL HIGH (ref 0.0–100.0)

## 2021-06-04 LAB — TRIGLYCERIDES: Triglycerides: 208 mg/dL — ABNORMAL HIGH (ref <150)

## 2021-06-04 LAB — AMMONIA: Ammonia: 23 umol/L (ref 9–35)

## 2021-06-04 LAB — FIBRINOGEN: Fibrinogen: 458 mg/dL (ref 210–475)

## 2021-06-04 LAB — PROCALCITONIN: Procalcitonin: 34.14 ng/mL

## 2021-06-04 MED ORDER — ALBUTEROL SULFATE (2.5 MG/3ML) 0.083% IN NEBU
2.5000 mg | INHALATION_SOLUTION | Freq: Four times a day (QID) | RESPIRATORY_TRACT | Status: DC
  Administered 2021-06-04 – 2021-06-10 (×22): 2.5 mg via RESPIRATORY_TRACT
  Filled 2021-06-04 (×23): qty 3

## 2021-06-04 MED ORDER — SODIUM CHLORIDE 0.9% IV SOLUTION: Freq: Once | INTRAVENOUS | Status: AC

## 2021-06-04 MED ORDER — IPRATROPIUM-ALBUTEROL 0.5-2.5 (3) MG/3ML IN SOLN
3.0000 mL | Freq: Four times a day (QID) | RESPIRATORY_TRACT | Status: DC
  Administered 2021-06-04: 3 mL via RESPIRATORY_TRACT
  Filled 2021-06-04: qty 3

## 2021-06-04 MED ORDER — CHLORHEXIDINE GLUCONATE 0.12% ORAL RINSE (MEDLINE KIT)
15.0000 mL | Freq: Two times a day (BID) | OROMUCOSAL | Status: DC
  Administered 2021-06-05 – 2021-06-21 (×27): 15 mL via OROMUCOSAL

## 2021-06-04 MED ORDER — METRONIDAZOLE 500 MG/100ML IV SOLN
500.0000 mg | Freq: Three times a day (TID) | INTRAVENOUS | Status: DC
  Administered 2021-06-04 – 2021-06-05 (×2): 500 mg via INTRAVENOUS
  Filled 2021-06-04 (×4): qty 100

## 2021-06-04 MED ORDER — POLYVINYL ALCOHOL 1.4 % OP SOLN
Freq: Every evening | OPHTHALMIC | Status: DC | PRN
  Administered 2021-06-05 – 2021-06-06 (×2): 1 [drp] via OPHTHALMIC
  Filled 2021-06-04 (×2): qty 15

## 2021-06-04 MED ORDER — ORAL CARE MOUTH RINSE
15.0000 mL | OROMUCOSAL | Status: DC
  Administered 2021-06-04 – 2021-06-08 (×44): 15 mL via OROMUCOSAL

## 2021-06-04 MED ORDER — SODIUM CHLORIDE 0.9 % IV SOLN
2.0000 g | Freq: Three times a day (TID) | INTRAVENOUS | Status: DC
  Administered 2021-06-04 – 2021-06-05 (×2): 2 g via INTRAVENOUS
  Filled 2021-06-04 (×3): qty 2

## 2021-06-04 MED ORDER — IPRATROPIUM-ALBUTEROL 0.5-2.5 (3) MG/3ML IN SOLN
3.0000 mL | Freq: Four times a day (QID) | RESPIRATORY_TRACT | Status: DC | PRN
  Administered 2021-06-10: 3 mL via RESPIRATORY_TRACT
  Filled 2021-06-04: qty 9

## 2021-06-04 MED ORDER — HYDROCORTISONE SOD SUC (PF) 100 MG IJ SOLR
50.0000 mg | Freq: Four times a day (QID) | INTRAMUSCULAR | Status: DC
  Administered 2021-06-04 – 2021-06-11 (×30): 50 mg via INTRAVENOUS
  Filled 2021-06-04 (×30): qty 2

## 2021-06-04 MED ORDER — BUDESONIDE 0.25 MG/2ML IN SUSP
0.2500 mg | Freq: Two times a day (BID) | RESPIRATORY_TRACT | Status: DC
  Administered 2021-06-04: 0.25 mg via RESPIRATORY_TRACT
  Filled 2021-06-04: qty 2

## 2021-06-04 MED ORDER — SODIUM PHOSPHATES 45 MMOLE/15ML IV SOLN
20.0000 mmol | Freq: Once | INTRAVENOUS | Status: AC
  Administered 2021-06-04: 20 mmol via INTRAVENOUS
  Filled 2021-06-04: qty 6.67

## 2021-06-04 MED ORDER — NOREPINEPHRINE 16 MG/250ML-% IV SOLN
0.0000 ug/min | INTRAVENOUS | Status: DC
  Administered 2021-06-04: 13 ug/min via INTRAVENOUS
  Administered 2021-06-05: 17 ug/min via INTRAVENOUS
  Administered 2021-06-05: 21 ug/min via INTRAVENOUS
  Administered 2021-06-06: 09:00:00 13 ug/min via INTRAVENOUS
  Administered 2021-06-09: 2 ug/min via INTRAVENOUS
  Filled 2021-06-04 (×5): qty 250

## 2021-06-04 MED ORDER — VASOPRESSIN 20 UNITS/100 ML INFUSION FOR SHOCK
0.0000 [IU]/min | INTRAVENOUS | Status: DC
  Administered 2021-06-04 – 2021-06-08 (×9): 0.03 [IU]/min via INTRAVENOUS
  Administered 2021-06-08: 0.02 [IU]/min via INTRAVENOUS
  Administered 2021-06-09: 0.03 [IU]/min via INTRAVENOUS
  Filled 2021-06-04 (×14): qty 100

## 2021-06-04 MED ORDER — BLISTEX MEDICATED EX OINT
TOPICAL_OINTMENT | CUTANEOUS | Status: DC | PRN
  Administered 2021-06-09: 1 via TOPICAL
  Filled 2021-06-04: qty 6.3

## 2021-06-04 MED ORDER — DEXTROSE 10 % IV SOLN: INTRAVENOUS | Status: AC

## 2021-06-04 NOTE — Progress Notes (Signed)
NAME:  Jonathan Mcmillan, MRN:  [id number], DOB:  24-Jan-1996, LOS: 4 ADMISSION DATE:  05/31/2021, CONSULTATION DATE:  06/01/2021 REFERRING MD:  Dr. Leslye Peer, CHIEF COMPLAINT:  Cardiac Arrest   Brief Pt Description / Synopsis:  26 y.o. admitted with AKI on CKD in setting of viral syndrome and diarrhea.  Suffered in-hospital cardiac arrest (initial rhythm asystole), suspect due to severe metabolic acidosis and multiple metabolic derangements.  History of Present Illness:  Jonathan Mcmillan is a 26 year old male with a past medical history significant for diabetes mellitus type 1, chronic kidney disease stage IV, hypertension, nicotine dependence who presented to [facility] ED on 05/31/2021 due to 3-day history of fever, chills, nonproductive cough, myalgias, and diarrhea. Patient denied urinary frequency, nocturia, dysuria, abdominal pain, hematemesis, chest pain, shortness of breath, edema.  ED Course: Initial vital signs: Temperature 102.6, RR 22, HR 104, BP 139/70, SpO2 97% on room air Significant labs: Sodium 131, potassium 4.4, chloride 103, bicarb 15, glucose 177, BUN 19, creatinine 13.86 (baseline of 3.92), calcium 6.7, alkaline phosphatase 70, lipase 29, AST 24, ALT 33, lactic acid 2.1, WBC 12.7, hemoglobin 5.2 (baseline of 8.3) hematocrit 15.8, MCV 89.8, RDW 12.8, platelets 172, PT 15.8, INR 1.3. COVID-19 and influenza PCR negative Respiratory viral panel negative Urinalysis with proteinuria Imaging: Chest x-ray negative for acute cardiopulmonary disease CT Chest w/o contrast>>1. Trace pericardial effusion and mild diffuse body wall edema, new since 01/02/2020. 2. Clear lungs, with no focal consolidation or pleural effusion. 3. Hypodense blood pool consistent with anemia. Renal US>>Increased renal echogenicity compatible with medical renal disease. Retroperitoneal perinephric edema suspect related to volume overload.  Patient was admitted by the hospitalist for further work-up and treatment of  acute kidney injury on CKD.  Nephrology was consulted.  Interval History Early in the morning on 06/01/2021 rapid response was called when he was found in the shower on a chair passed out. Found to be  hypotensive, hypoglycemic, and lethargic with his "eyes rolling in the back of his head."  Patient was given 500 cc fluid bolus and ordered for blood transfusion.  However CODE BLUE was called as pt was found to be in asystole (progressed to PEA).  He received epinephrine x2, sodium bicarbonate x2, calcium x1, glucose and insulin.  Pulse was regained and he was transferred to ICU.  Upon arrival to the ICU patient patient is critically ill and concern for decorticate posturing.  PCCM consulted for further management.  Pertinent  Medical History  Chronic Kidney Disease Stage IV Diabetes Mellitus 1  Micro Data:  05/31/2021: SARS-CoV-2 and influenza PCR>> negative 05/31/2021: Respiratory viral panel>> negative 05/31/2021: Group A strep PCR>> not detected 05/31/2021: Blood culture x2>> no growth to date 05/31/2021: GI panel>> negative 05/31/2021: C. Difficile>> negative 06/01/2021: Tracheal aspirate>> gram + cocci, gram + rods, gram - rods,  06/01/2021: Urine>> 06/01/2021: Blood culture>>  Antimicrobials:  Ceftriaxone 1/19 x 1 dose Vancomycin 1/19>> Zosyn 1/19>>  Significant Hospital Events: Including procedures, antibiotic start and stop dates in addition to other pertinent events   1/18: Admitted by the hospitalist.  Nephrology consulted, plans for vascular to place temporary dialysis catheter with initiation of dialysis 1/19: In-hospital cardiac arrest.  Critically ill. Transferred to ICU.  Plan for CRRT.  Central line, A line, and Trialysis catheter placed.  Concern for possible anoxic brain injury.  Sepsis workup in progress 1/20: Pt is awake, tracking, following simple commands. Remains on CRRT, bicarb gtt d/c.  Weaning fiO2 and pressors. Tracheal aspirate growing gram + cocci,  gram + rods, gram  - rods 1/21: Extubated; leukocytosis, thrombocytopenia 1/22: Reintubated overnight; apparent large volume aspiration; worsening shock  Interim History / Subjective:  Unfortunately reintubated overnight after being apparently obtunded and hypoxic. CT head showed no abnormalities. CT c/a/p showed large volume of dependent lower lobe consolidations R>L consistent with aspiration along with moderate-sized bilateral pleural effusions. Requiring >20 mcg/min Levo to maintain MAP >65 this morning. Platelets further decreased to 13. No bleeding or thrombosis noted. Labs not indicative of DIC or hemolysis.  Objective   Blood pressure (!) 90/55, pulse (!) 110, temperature (!) 97.3 F (36.3 C), resp. rate 20, height 5' 7.99" (1.727 m), weight 67.9 kg, SpO2 100 %.    Vent Mode: PRVC FiO2 (%):  [30 %-50 %] 30 % Set Rate:  [16 bmp-20 bmp] 20 bmp Vt Set:  [450 mL] 450 mL PEEP:  [5 cmH20] 5 cmH20 Plateau Pressure:  [19 cmH20] 19 cmH20   Intake/Output Summary (Last 24 hours) at 06/04/2021 1130 Last data filed at 06/04/2021 1100 Gross per 24 hour  Intake 1767.62 ml  Output 3261 ml  Net -1493.38 ml   Filed Weights   06/02/21 0424 06/03/21 0409 06/04/21 0423  Weight: 75 kg 72.7 kg 67.9 kg    Examination: General: ill-appearing male, intubated and sedated Lungs: decreased at the bases bilaterally, rhonchorous bilaterally Cardiovascular: Tachycardia, regular rhythm, no MRG Abdomen: Soft, nondistended, +BS Extremities: 1+ pitting edema to shins bilaterally Neuro: moving all extremities, nonf-ocal exam GU: Foley catheter in place with minimal urine output  Resolved Hospital Problem list     Assessment & Plan:   Acute hypoxic respiratory failure Aspiration pneumonitis -No indication for bronchoscopy as consolidations are dependent and in the lower lobes, consistent with aspiration, no proximal bronchus cut-off that would be improved -Continue Zosyn, repeat tracheal aspirate -Aggressive chest  PT -Full vent support, implement lung protective strategies -Plateau pressures less than 30 cm H20 -Wean FiO2 & PEEP as tolerated to maintain O2 sats >92% -Follow intermittent Chest X-ray & ABG as needed -Implement VAP Bundle -Prn Bronchodilators  Septic shock Lactic acidosis -Continue Zosyn as above -Add vasopressin, stress dose steroids -Continue Levophed -MAP goal >65 -Stop UF; continue CRRT at even volume target -Discussed CRRT plan with Dr. Candiss Norse -Follow up cultures -Trend lactate to close  In-Hospital Cardiac Arrest (initial rhythm asystole), suspect in setting of severe metabolic acidosis and multiple metabolic derangements New systolic heart failure PMHx: Hypertension -Continuous cardiac monitoring -Serial EKG's -Maintain MAP >65 -Transfusions as indicated -Vasopressors as needed to maintain MAP goal -Echocardiogram 06/01/21>>LVEF 35-40% and global hypokinesis, grade II diastolic dysfunction, mildly reduced RV function, and moderately increased Pulmonary systolic pressure -Urine drug screen + for Cannabinoid -Cardiology following, appreciate input  Anemia of Chronic Disease Thrombocytopenia (4T score is 3-4) -Dr. Gale Journey with Hematology consulted -Monitor for S/Sx of bleeding -Trend CBC -SCD's for VTE Prophylaxis  -Transfuse for Hgb <7 -Transfuse platelets for platelet count <10K -Received 2 units prbc's 1/19 -Timeline does not fit with HIT, 4T score is low to moderate probability given his baseline dialysis requirement and probable heparin exposure -Suspect thrombocytopenia in setting of sepsis, however, will continue to monitor -Sent HIT panel, ADAMTS13, haptoglobin; doubt HUS but on differential -Obtain peripheral smear -Avoid heparin products  AKI on CKD Stage IV Severe Anion Gap Metabolic Acidosis in setting of AKI & Lactic Acidosis Hyponatremia Hypocalcemia -Monitor I&O's / urinary output -Follow BMP -Ensure adequate renal perfusion -Avoid nephrotoxic  agents as able -Replace electrolytes as indicated -Nephrology following, appreciate  input ~ Continues on CRRT  Hypoglycemia PMHx: DM type I -CBG's q4h; Target range of 140 to 180 -D10 infusion -Follow ICU Hypo/Hyperglycemia protocol -Holding tube feeds until shock improves  Pt is critically ill with multiorgan failure.  Prognosis is guarded, high risk for further cardiac arrest and death. This was communicated with aunt and HCPOA today (1/22). He is not a candidate for any additional advanced therapies at this time.   Best Practice (right click and "Reselect all SmartList Selections" daily)   Diet/type: NPO DVT prophylaxis: SCD (holding chemical prophylaxis due to thrombocytopenia) GI prophylaxis: PPI Lines: Central line and yes and it is still needed Foley:  Yes, and it is still needed Code Status:  full code Last date of multidisciplinary goals of care discussion [06/02/21]  Labs   CBC: Recent Labs  Lab 05/31/21 0835 06/01/21 0404 06/02/21 0357 06/03/21 0307 06/04/21 0016 06/04/21 0234 06/04/21 0936  WBC 12.7*   < > 8.6 25.9* 22.5* 21.2* 24.1*  NEUTROABS 11.6*  --   --   --   --   --  19.3*  HGB 5.2*   < > 9.2* 9.2* 9.0* 9.0* 8.9*  HCT 15.8*   < > 25.4* 26.2* 25.1* 24.8* 25.4*  MCV 89.8   < > 84.1 86.2 85.4 84.1 84.9  PLT 172   < > 76* 29* 14* 13* 10*   < > = values in this interval not displayed.    Basic Metabolic Panel: Recent Labs  Lab 06/02/21 0357 06/02/21 1535 06/03/21 0307 06/03/21 0500 06/03/21 1416 06/04/21 0016 06/04/21 0234 06/04/21 0508  NA 130* 133*  --  131* 136 134*  --  134*  K 3.1* 3.9  --  3.5 3.2* 2.9*  --  3.1*  CL 99 99  --  97* 102 101  --  101  CO2 19* 20*  --  23 24 24   --  23  GLUCOSE 95 96  --  146* 134* 134*  --  154*  BUN 58* 38*  --  23* 17 17  --  15  CREATININE 7.70* 4.69*  --  2.85* 1.97* 1.81*  --  1.68*  CALCIUM 6.6* 8.0*  --  7.9* 8.3* 8.1*  --  7.9*  MG 1.6*  --  1.8  --   --   --  1.9  --   PHOS 5.8* 5.1*  --  3.7  2.2*  --   --  1.5*   GFR: Estimated Creatinine Clearance: 63.4 mL/min (A) (by C-G formula based on SCr of 1.68 mg/dL (H)). Recent Labs  Lab 06/01/21 1325 06/01/21 1715 06/02/21 0357 06/02/21 0916 06/02/21 1123 06/03/21 0307 06/04/21 0016 06/04/21 0158 06/04/21 0210 06/04/21 0234 06/04/21 0936  PROCALCITON 108.03  --  93.56  --   --  67.49  --  34.14  --   --   --   WBC 4.9  --  8.6  --   --  25.9* 22.5*  --   --  21.2* 24.1*  LATICACIDVEN 8.3*   < >  --    < > 4.3*  --  2.8*  --  2.8*  --  3.3*   < > = values in this interval not displayed.    Liver Function Tests: Recent Labs  Lab 05/31/21 0835 06/01/21 0404 06/01/21 1325 06/01/21 1937 06/02/21 1535 06/03/21 0500 06/03/21 1416 06/04/21 0016 06/04/21 0508  AST 24  --  101*  --   --   --  21 18  --  ALT 33  --  78*  --   --   --  42 39  --   ALKPHOS 70  --  68  --   --   --  91 89  --   BILITOT 0.6  --  1.1  --   --   --  2.2* 2.3*  --   PROT 6.3*  --  4.7*  --   --   --  4.4* 4.6*  --   ALBUMIN 2.9*   < > 2.0*   < > 2.1* 1.9* 1.7*   1.7* 1.8* 1.8*   < > = values in this interval not displayed.   Recent Labs  Lab 05/31/21 0835  LIPASE 29   Recent Labs  Lab 06/04/21 0016  AMMONIA 23    ABG    Component Value Date/Time   PHART 7.39 06/04/2021 0508   PCO2ART 40 06/04/2021 0508   PO2ART 109 (H) 06/04/2021 0508   HCO3 24.2 06/04/2021 0508   TCO2 22 06/24/2007 0045   ACIDBASEDEF 0.7 06/04/2021 0508   O2SAT 98.2 06/04/2021 0508     Coagulation Profile: Recent Labs  Lab 05/31/21 0835 06/01/21 1715 06/04/21 0234  INR 1.3* 2.3* 1.3*    Cardiac Enzymes: Recent Labs  Lab 05/31/21 1114  CKTOTAL 411*    HbA1C: Hemoglobin-A1c  Date/Time Value Ref Range Status  06/26/2007 06:40 AM   Final   17.6% HIGH (NOTE) Reference Intervals:  Diabetic Adult        <9.0 Healthy Adult         3.9 - 7.3 Current ADA guidelines recommend a treatment goal of <7.0% HgbA1c for diabetic patients, which corresponds to  a <9.0% Glycohemoglobin result with this method.   Performed at:  [facility]               [place], [place]  [id number]   Hemoglobin A1C  Date/Time Value Ref Range Status  08/05/2013 01:26 PM 12.9 (H) 4.2 - 6.3 % Final    Comment:    The American Diabetes Association recommends that a primary goal of therapy should be <7% and that physicians should reevaluate the treatment regimen in patients with HbA1c values consistently >8%.    Hgb A1c MFr Bld  Date/Time Value Ref Range Status  05/31/2021 12:05 PM 5.7 (H) 4.8 - 5.6 % Final    Comment:    RESULTS CONFIRMED BY MANUAL DILUTION REPEATED TO VERIFY (NOTE) Pre diabetes:          5.7%-6.4%  Diabetes:              >6.4%  Glycemic control for   <7.0% adults with diabetes   06/11/2020 03:41 AM 7.0 (H) 4.8 - 5.6 % Final    Comment:    (NOTE) Pre diabetes:          5.7%-6.4%  Diabetes:              >6.4%  Glycemic control for   <7.0% adults with diabetes     CBG: Recent Labs  Lab 06/03/21 2200 06/04/21 0156 06/04/21 0416 06/04/21 0613 06/04/21 0731  GLUCAP 73 117* 132* 131* 160*    Review of Systems:   Unable to assess due to intubation, AMS, critical illness   Past Medical History:  He,  has a past medical history of Cannabinoid hyperemesis syndrome, Diabetes 1.5, managed as type 1 ([place]), Gastroparesis,  and Perirectal abscess (06/16/2017).   Surgical History:   Past Surgical History:  Procedure Laterality Date   INCISION AND DRAINAGE PERIRECTAL ABSCESS N/A 06/16/2017   Procedure: IRRIGATION AND DEBRIDEMENT PERIRECTAL ABSCESS;  Surgeon: Florene Glen, MD;  Location: [facility] ORS;  Service: General;  Laterality: N/A;   none       Social History:   reports that he has been smoking cigarettes. He has been smoking an average of .5 packs per day. He has never used smokeless tobacco. He reports current drug use. Drug: Marijuana. He reports that he does not drink alcohol.   Family History:   His family history includes Diabetes in his mother.   Allergies No Known Allergies   Home Medications  Prior to Admission medications   Medication Sig Start Date End Date Taking? Authorizing Provider  ondansetron (ZOFRAN) 4 MG tablet Take 1 tablet (4 mg total) by mouth every 8 (eight) hours as needed for up to 10 doses for nausea or vomiting. 05/31/21  Yes Lucrezia Starch, MD  amLODipine (NORVASC) 10 MG tablet Take 1 tablet (10 mg total) by mouth daily. Patient not taking: Reported on 06/01/2021 06/14/20   Fritzi Mandes, MD  hydrALAZINE (APRESOLINE) 50 MG tablet Take 1 tablet (50 mg total) by mouth 2 (two) times daily. Patient not taking: Reported on 06/01/2021 06/13/20   Fritzi Mandes, MD  insulin glargine (LANTUS) 100 UNIT/ML injection Inject 20 Units into the skin at bedtime. Patient not taking: Reported on 06/01/2021 08/26/20   [provider]  omeprazole (PRILOSEC) 40 MG capsule Take 1 capsule (40 mg total) by mouth daily. 03/08/20 05/07/20  Arta Silence, MD  pantoprazole (PROTONIX) 40 MG tablet Take 1 tablet (40 mg total) by mouth daily. Patient not taking: Reported on 12/10/2018 09/10/18 01/02/20  Hillary Bow, MD  sucralfate (CARAFATE) 1 g tablet Take 1 tablet (1 g total) by mouth 4 (four) times daily. Patient not taking: Reported on 06/26/2019 02/04/19 01/02/20  Nance Pear, MD     Critical care time: 44 minutes     Laiken Nohr Sharene Butters, MD 06/04/21 11:30 AM   [facility] Prefer epic messenger for cross cover needs If after hours, please call E-link

## 2021-06-04 NOTE — Consult Note (Addendum)
[place] for Electrolyte Monitoring and Replacement   Recent Labs: Potassium (mmol/L)  Date Value  06/04/2021 3.1 (L)  08/05/2013 3.8   Magnesium (mg/dL)  Date Value  06/04/2021 1.9   Calcium (mg/dL)  Date Value  06/04/2021 7.9 (L)   Calcium, Total (PTH) (mg/dL)  Date Value  06/12/2020 8.0   Albumin (g/dL)  Date Value  06/04/2021 1.8 (L)  08/05/2013 2.9 (L)   Phosphorus (mg/dL)  Date Value  06/04/2021 1.5 (L)   Sodium (mmol/L)  Date Value  06/04/2021 134 (L)  08/05/2013 133 (L)   Assessment: Jonathan Mcmillan is a 26 y.o. male with medical history including diabetes, CKD, HTN, nicotine dependence admitted on 05/31/2021 with  AKI on CKD, anemia, and fevers/chills/myalgias . Patient suffered cardiac arrest on the morning of 1/19 and was ultimately intubated and transferred to the ICU. Patient is currently on CRRT per nephrology. Pharmacy has been consulted to monitor and replace electrolytes.  Goal of Therapy:  Electrolytes within normal limits  Plan:  --Remains on CRRT --Potassium correction with CRRT fluids --Phos 20 mmol IV x 1 --Continue to monitor renal function panels BID while on CRRT  Tawnya Crook, PharmD, BCPS Clinical Pharmacist 06/04/2021 10:57 AM

## 2021-06-04 NOTE — Progress Notes (Signed)
Patient glucose 73. D10 running at 30 ml/hr. NP notified and D10 increased to 50/hr.

## 2021-06-04 NOTE — Progress Notes (Signed)
° °  11 am: Patient remains on ventilator at 40% Fi02, with minimal sedation. Patient remains unable to follow commands, no grimacing or agitation but is responsive to pain and will open eyes when stimulated. Patients BP extremely labile ranging from systolic in the mid 480'X to systolic in the low 65'V within minutes despite no dose rate changes being made. Vasopressin added.  Regarding CRRT, patients fluid removal rate changed from net negative 100 to net zero (keep even) per Dr. Jonnie Finner.

## 2021-06-04 NOTE — Consult Note (Signed)
Pharmacy Antibiotic Note  Jonathan Mcmillan is a 26 y.o. male admitted on 05/31/2021 with pneumonia.  Pharmacy has been consulted for cefepime dosing.  Plan: Cefepime 2gm IV every 8 hours starting at [place] 3.75gm IV every 6 hours (last dose given at 1300)  Height: 5' 7.99" (172.7 cm) Weight: 67.9 kg (149 lb 11.1 oz) IBW/kg (Calculated) : 68.38  Temp (24hrs), Avg:97 F (36.1 C), Min:95.4 F (35.2 C), Max:99.5 F (37.5 C)  Recent Labs  Lab 06/02/21 0916 06/02/21 1123 06/02/21 1535 06/03/21 0307 06/03/21 0500 06/03/21 1416 06/04/21 0016 06/04/21 0210 06/04/21 0234 06/04/21 0508 06/04/21 0936 06/04/21 1126  WBC  --   --   --  25.9*  --   --  22.5*  --  21.2*  --  24.1* 25.2*  CREATININE  --   --    < >  --  2.85* 1.97* 1.81*  --   --  1.68*  --  1.54*  LATICACIDVEN 4.7* 4.3*  --   --   --   --  2.8* 2.8*  --   --  3.3*  --    < > = values in this interval not displayed.    Estimated Creatinine Clearance: 69.2 mL/min (A) (by C-G formula based on SCr of 1.54 mg/dL (H)).    No Known Allergies  Antimicrobials this admission: 1/22 Cefepime >> 1/19 Zosyn >> 1/22 1/19 Vancomycin >> 1/20   Microbiology results: 1/19 BCx: no growth 1/19 Tracheal aspirate:  Moderate GPC, No pseudomonas spp 1/20 MRSA PCR: Not Detected  Thank you for allowing pharmacy to be a part of this patients care.  Darrick Penna 06/04/2021 3:33 PM

## 2021-06-04 NOTE — Progress Notes (Signed)
On assessment patient has severe rhonci with weak cough effort. NT suction attempted with minimal results. Of note, during catheter insertion for deep suction patient patient has minimal cough and gag reflexes. Rhonci persisted. Dr Candiss Norse contacted for fluid removal via CRRT. New net goal -100. Will continue to monitor.

## 2021-06-04 NOTE — Progress Notes (Signed)
[facility]  ROUNDING NOTE   Subjective:   Patient was extubated on 1/21 but had to be re-intubated for respiratory distress; suspected to have aspirated Neuro- sedated with propofol Pulm - vent assisted; fio2 40% Cvs: pressors - nor epi, A-line Renal- low UOP; crrt continued   Objective:  Vital signs in last 24 hours:  Temp:  [95.4 F (35.2 C)-98.8 F (37.1 C)] 97.2 F (36.2 C) (01/22 0900) Pulse Rate:  [85-110] 108 (01/22 0900) Resp:  [20] 20 (01/22 0800) BP: (85-145)/(47-93) 104/61 (01/22 0900) SpO2:  [92 %-100 %] 100 % (01/22 0900) Arterial Line BP: (88-169)/(48-88) 104/50 (01/22 0900) FiO2 (%):  [40 %-50 %] 40 % (01/22 0800) Weight:  [67.9 kg] 67.9 kg (01/22 0423)  Weight change: -4.8 kg Filed Weights   06/02/21 0424 06/03/21 0409 06/04/21 0423  Weight: 75 kg 72.7 kg 67.9 kg    Intake/Output: I/O last 3 completed shifts: In: 3801.9 [I.V.:2926.9; Other:35; NG/GT:590; IV Piggyback:250] Out: 5176 [Urine:43; Other:3808; Stool:1325]   Intake/Output this shift:  Total I/O In: 258.3 [I.V.:208.3; IV Piggyback:50] Out: 245 [Other:245]  Physical Exam: General: Critically ill  Eyes: Anicteric  Lungs:  Vent assisted  Heart: Regular rate and rhythm, tachycardic  Abdomen:  Soft,    Extremities: + peripheral edema.  Neurologic: Intubated.    Skin: No lesions  Access:  Right femoral temp HD catheter 06/01/21  Rectal tube, Foley in place  Basic Metabolic Panel: Recent Labs  Lab 06/02/21 0357 06/02/21 1535 06/03/21 0307 06/03/21 0500 06/03/21 1416 06/04/21 0016 06/04/21 0234 06/04/21 0508  NA 130* 133*  --  131* 136 134*  --  134*  K 3.1* 3.9  --  3.5 3.2* 2.9*  --  3.1*  CL 99 99  --  97* 102 101  --  101  CO2 19* 20*  --  _0 --  23  GLUCOSE 95 96  --  146* 134* 134*  --  154*  BUN 58* 38*  --  23* 17 17  --  15  CREATININE 7.70* 4.69*  --  2.85* 1.97* 1.81*  --  1.68*  CALCIUM 6.6* 8.0*  --  7.9* 8.3* 8.1*  --  7.9*  MG 1.6*  --  1.8   --   --   --  1.9  --   PHOS 5.8* 5.1*  --  3.7 2.2*  --   --  1.5*     Liver Function Tests: Recent Labs  Lab 05/31/21 0835 06/01/21 0404 06/01/21 1325 06/01/21 1937 06/02/21 1535 06/03/21 0500 06/03/21 1416 06/04/21 0016 06/04/21 0508  AST 24  --  101*  --   --   --  21 18  --   ALT 33  --  78*  --   --   --  42 39  --   ALKPHOS 70  --  68  --   --   --  91 89  --   BILITOT 0.6  --  1.1  --   --   --  2.2* 2.3*  --   PROT 6.3*  --  4.7*  --   --   --  4.4* 4.6*  --   ALBUMIN 2.9*   < > 2.0*   < > 2.1* 1.9* 1.7*   1.7* 1.8* 1.8*   < > = values in this interval not displayed.    Recent Labs  Lab 05/31/21 0835  LIPASE 29    Recent Labs  Lab  06/04/21 0016  AMMONIA 23    CBC: Recent Labs  Lab 05/31/21 0835 06/01/21 0404 06/01/21 1325 06/02/21 0357 06/03/21 0307 06/04/21 0016 06/04/21 0234  WBC 12.7*   < > 4.9 8.6 25.9* 22.5* 21.2*  NEUTROABS 11.6*  --   --   --   --   --   --   HGB 5.2*   < > 7.7* 9.2* 9.2* 9.0* 9.0*  HCT 15.8*   < > 21.9* 25.4* 26.2* 25.1* 24.8*  MCV 89.8   < > 87.6 84.1 86.2 85.4 84.1  PLT 172   < > 100* 76* 29* 14* 13*   < > = values in this interval not displayed.     Cardiac Enzymes: Recent Labs  Lab 05/31/21 1114  CKTOTAL 411*     BNP: Invalid input(s): POCBNP  CBG: Recent Labs  Lab 06/03/21 2200 06/04/21 0156 06/04/21 0416 06/04/21 0613 06/04/21 0731  GLUCAP 73 117* 132* 131* 160*     Microbiology: Results for orders placed or performed during the hospital encounter of 05/31/21  Resp Panel by RT-PCR (Flu A&B, Covid) Nasopharyngeal Swab     Status: None   Collection Time: 05/31/21  6:58 AM   Specimen: Nasopharyngeal Swab; Nasopharyngeal(NP) swabs in vial transport medium  Result Value Ref Range Status   SARS Coronavirus 2 by RT PCR NEGATIVE NEGATIVE Final    Comment: (NOTE) SARS-CoV-2 target nucleic acids are NOT DETECTED.  The SARS-CoV-2 RNA is generally detectable in upper respiratory specimens during  the acute phase of infection. The lowest concentration of SARS-CoV-2 viral copies this assay can detect is 138 copies/mL. A negative result does not preclude SARS-Cov-2 infection and should not be used as the sole basis for treatment or other patient management decisions. A negative result may occur with  improper specimen collection/handling, submission of specimen other than nasopharyngeal swab, presence of viral mutation(s) within the areas targeted by this assay, and inadequate number of viral copies(<138 copies/mL). A negative result must be combined with clinical observations, patient history, and epidemiological information. The expected result is Negative.  Fact Sheet for Patients:  EntrepreneurPulse.com.au  Fact Sheet for Healthcare Providers:  IncredibleEmployment.be  This test is no t yet approved or cleared by the [place] FDA and  has been authorized for detection and/or diagnosis of SARS-CoV-2 by FDA under an Emergency Use Authorization (EUA). This EUA will remain  in effect (meaning this test can be used) for the duration of the COVID-19 declaration under Section 564(b)(1) of the Act, 21 U.S.C.section 360bbb-3(b)(1), unless the authorization is terminated  or revoked sooner.       Influenza A by PCR NEGATIVE NEGATIVE Final   Influenza B by PCR NEGATIVE NEGATIVE Final    Comment: (NOTE) The Xpert Xpress SARS-CoV-2/FLU/RSV plus assay is intended as an aid in the diagnosis of influenza from Nasopharyngeal swab specimens and should not be used as a sole basis for treatment. Nasal washings and aspirates are unacceptable for Xpert Xpress SARS-CoV-2/FLU/RSV testing.  Fact Sheet for Patients: EntrepreneurPulse.com.au  Fact Sheet for Healthcare Providers: IncredibleEmployment.be  This test is not yet approved or cleared by the [place] FDA and has been authorized for detection and/or  diagnosis of SARS-CoV-2 by FDA under an Emergency Use Authorization (EUA). This EUA will remain in effect (meaning this test can be used) for the duration of the COVID-19 declaration under Section 564(b)(1) of the Act, 21 U.S.C. section 360bbb-3(b)(1), unless the authorization is terminated or revoked.  Performed at [facility],  [place], [place] [address]   Respiratory (~20 pathogens) panel by PCR     Status: None   Collection Time: 05/31/21  6:58 AM   Specimen: Nasopharyngeal Swab; Respiratory  Result Value Ref Range Status   Adenovirus NOT DETECTED NOT DETECTED Final   Coronavirus 229E NOT DETECTED NOT DETECTED Final    Comment: (NOTE) The Coronavirus on the Respiratory Panel, DOES NOT test for the novel  Coronavirus (2019 nCoV)    Coronavirus HKU1 NOT DETECTED NOT DETECTED Final   Coronavirus NL63 NOT DETECTED NOT DETECTED Final   Coronavirus OC43 NOT DETECTED NOT DETECTED Final   Metapneumovirus NOT DETECTED NOT DETECTED Final   Rhinovirus / Enterovirus NOT DETECTED NOT DETECTED Final   Influenza A NOT DETECTED NOT DETECTED Final   Influenza B NOT DETECTED NOT DETECTED Final   Parainfluenza Virus 1 NOT DETECTED NOT DETECTED Final   Parainfluenza Virus 2 NOT DETECTED NOT DETECTED Final   Parainfluenza Virus 3 NOT DETECTED NOT DETECTED Final   Parainfluenza Virus 4 NOT DETECTED NOT DETECTED Final   Respiratory Syncytial Virus NOT DETECTED NOT DETECTED Final   Bordetella pertussis NOT DETECTED NOT DETECTED Final   Bordetella Parapertussis NOT DETECTED NOT DETECTED Final   Chlamydophila pneumoniae NOT DETECTED NOT DETECTED Final   Mycoplasma pneumoniae NOT DETECTED NOT DETECTED Final    Comment: Performed at [facility], [place]. [address] [place], [place] [address]  Group A Strep by PCR [facility] Only)     Status: None   Collection Time: 05/31/21  8:35 AM   Specimen: Throat; Sterile Swab  Result Value Ref Range Status   Group A Strep by PCR NOT  DETECTED NOT DETECTED Final    Comment: Performed at [facility], [address] [place], [place] [address]  Blood culture (routine x 2)     Status: None (Preliminary result)   Collection Time: 05/31/21  8:39 AM   Specimen: BLOOD  Result Value Ref Range Status   Specimen Description BLOOD BLOOD RIGHT FOREARM  Final   Special Requests   Final    BOTTLES DRAWN AEROBIC AND ANAEROBIC Blood Culture results may not be optimal due to an excessive volume of blood received in culture bottles   Culture   Final    NO GROWTH 4 DAYS Performed at [facility], [address], [place], [place] [address]    Report Status PENDING  Incomplete  Blood culture (routine x 2)     Status: None (Preliminary result)   Collection Time: 05/31/21  8:39 AM   Specimen: BLOOD  Result Value Ref Range Status   Specimen Description BLOOD RIGHT ANTECUBITAL  Final   Special Requests   Final    BOTTLES DRAWN AEROBIC AND ANAEROBIC Blood Culture results may not be optimal due to an excessive volume of blood received in culture bottles   Culture   Final    NO GROWTH 4 DAYS Performed at [facility], [address] [place], [place] [address]    Report Status PENDING  Incomplete  C Difficile Quick Screen w PCR reflex     Status: None   Collection Time: 05/31/21  1:20 PM   Specimen: Stool  Result Value Ref Range Status   C Diff antigen NEGATIVE NEGATIVE Final   C Diff toxin NEGATIVE NEGATIVE Final   C Diff interpretation No C. difficile detected.  Final    Comment: Performed at [facility], [address], [place], [place] [address]  Gastrointestinal Panel by PCR , Stool     Status: None   Collection Time: 05/31/21  1:30 PM   Specimen: Stool  Result Value Ref Range Status   Campylobacter species NOT DETECTED NOT DETECTED Final   Plesimonas shigelloides NOT DETECTED NOT DETECTED Final   Salmonella species NOT DETECTED NOT DETECTED Final   Yersinia enterocolitica NOT DETECTED  NOT DETECTED Final   Vibrio species NOT DETECTED NOT DETECTED Final   Vibrio cholerae NOT DETECTED NOT DETECTED Final   Enteroaggregative E coli (EAEC) NOT DETECTED NOT DETECTED Final   Enteropathogenic E coli (EPEC) NOT DETECTED NOT DETECTED Final   Enterotoxigenic E coli (ETEC) NOT DETECTED NOT DETECTED Final   Shiga like toxin producing E coli (STEC) NOT DETECTED NOT DETECTED Final   Shigella/Enteroinvasive E coli (EIEC) NOT DETECTED NOT DETECTED Final   Cryptosporidium NOT DETECTED NOT DETECTED Final   Cyclospora cayetanensis NOT DETECTED NOT DETECTED Final   Entamoeba histolytica NOT DETECTED NOT DETECTED Final   Giardia lamblia NOT DETECTED NOT DETECTED Final   Adenovirus F40/41 NOT DETECTED NOT DETECTED Final   Astrovirus NOT DETECTED NOT DETECTED Final   Norovirus GI/GII NOT DETECTED NOT DETECTED Final   Rotavirus A NOT DETECTED NOT DETECTED Final   Sapovirus (I, II, IV, and V) NOT DETECTED NOT DETECTED Final    Comment: Performed at [facility], [address], [place], [place] [address]  Urine Culture     Status: None   Collection Time: 06/01/21 10:23 AM   Specimen: In/Out Cath Urine  Result Value Ref Range Status   Specimen Description   Final    IN/OUT CATH URINE Performed at [facility], [address] [place], [place] [address]    Special Requests   Final    NONE Performed at [facility], [address], [place], [place] [address]    Culture   Final    NO GROWTH Performed at [facility], [address] [place], [place] [address]    Report Status 06/03/2021 FINAL  Final  Culture, Respiratory w Gram Stain     Status: None   Collection Time: 06/01/21  3:59 PM   Specimen: Tracheal Aspirate; Respiratory  Result Value Ref Range Status   Specimen Description   Final    TRACHEAL ASPIRATE Performed at [facility], [address], [place], [place] [address]    Special Requests   Final    NONE Performed at [facility], [place], [place] [address]    Gram Stain   Final    RARE SQUAMOUS EPITHELIAL CELLS PRESENT MODERATE WBC PRESENT,BOTH PMN AND MONONUCLEAR MODERATE GRAM POSITIVE COCCI FEW GRAM NEGATIVE RODS FEW GRAM POSITIVE RODS RARE YEAST    Culture   Final    FEW Consistent with normal respiratory flora. No Pseudomonas species isolated Performed at [facility] [address] [place], [place] [address]    Report Status 06/03/2021 FINAL  Final  CULTURE, BLOOD (ROUTINE X 2) w Reflex to ID Panel     Status: None (Preliminary result)   Collection Time: 06/01/21  7:06 PM   Specimen: BLOOD  Result Value Ref Range Status   Specimen Description BLOOD RIGHT ANTECUBITAL  Final   Special Requests   Final    BOTTLES DRAWN AEROBIC ONLY Blood Culture results may not be optimal due to an inadequate volume of blood received in culture bottles   Culture   Final    NO  GROWTH 3 DAYS Performed at [facility], [address] [place], [place] [address]    Report Status PENDING  Incomplete  CULTURE, BLOOD (ROUTINE X 2) w Reflex to ID Panel     Status: None (Preliminary result)   Collection Time: 06/01/21  7:37 PM   Specimen: BLOOD  Result Value Ref Range Status   Specimen Description BLOOD [facility]  Final   Special Requests BOTTLES DRAWN AEROBIC AND ANAEROBIC BCLV  Final   Culture   Final    NO GROWTH 3 DAYS Performed at [facility], [address] [place], [place] [address]    Report Status PENDING  Incomplete  MRSA Next Gen by PCR, Nasal     Status: None   Collection Time: 06/02/21 10:46 AM   Specimen: Nasal Mucosa; Nasal Swab  Result Value Ref Range Status   MRSA by PCR Next Gen NOT DETECTED NOT DETECTED Final    Comment: (NOTE) The GeneXpert MRSA Assay (FDA approved for NASAL specimens only), is one component of a comprehensive MRSA colonization surveillance program. It is not intended to diagnose MRSA infection nor to guide or monitor treatment  for MRSA infections. Test performance is not FDA approved in patients less than 33 years old. Performed at [facility], [address] [place], [place] [address]     Coagulation Studies: Recent Labs    06/01/21 1715 06/04/21 0234  LABPROT 25.0* 16.6*  INR 2.3* 1.3*     Urinalysis: Recent Labs    06/01/21 1023  COLORURINE AMBER*  LABSPEC 1.017  PHURINE 6.0  GLUCOSEU 50*  HGBUR MODERATE*  BILIRUBINUR NEGATIVE  KETONESUR NEGATIVE  PROTEINUR >=300*  NITRITE NEGATIVE  LEUKOCYTESUR LARGE*       Imaging: DG Chest 1 View  Result Date: 06/03/2021 CLINICAL DATA:  Post intubation EXAM: CHEST  1 VIEW COMPARISON:  06/02/2021, 05/31/2021, 03/08/2020 FINDINGS: Tip of the endotracheal tube is about 3.3 cm superior to carina. Cardiomegaly with bilateral effusions and worsened basilar airspace disease. Vascular congestion. Esophageal tube has been removed. IMPRESSION: 1. Endotracheal tube tip about 3.3 cm superior to carina. 2. Cardiomegaly with interim development of bilateral effusions and basilar airspace disease. Vascular congestion and mild edema. Overall aeration is worse as compared with 06/02/2021. Electronically Signed   By: Donavan Foil M.D.   On: 06/03/2021 23:24   DG Abd 1 View  Result Date: 06/04/2021 CLINICAL DATA:  OG tube. EXAM: ABDOMEN - 1 VIEW COMPARISON:  CT CAP, 06/03/2021.  KUB, 06/03/2021 and 06/01/2021. FINDINGS: Support lines: Enteric tube with tip and side port within stomach. RIGHT femoral dialysis catheter, incompletely imaged. Overlying pacer leads. The bowel gas pattern is normal.  No interval osseous abnormality. IMPRESSION: 1. Well-positioned enteric feeding tube, with tip and side port within stomach. Additional lines and tubes as above. 2. Nonobstructed bowel-gas. Electronically Signed   By: Michaelle Birks M.D.   On: 06/04/2021 08:39   DG Abd 1 View  Result Date: 06/03/2021 CLINICAL DATA:  Distension EXAM: ABDOMEN - 1 VIEW COMPARISON:   06/01/2021 FINDINGS: Nonobstructed gas pattern with paucity of bowel gas. Bilateral femoral catheters, right catheter tip over lies the right aspect of the L2 vertebral body. Left catheter tip overlies the upper aspect of left sacrum. IMPRESSION: Nonobstructed gas pattern with overall paucity of bowel gas. Esophageal tube has been removed. Electronically Signed   By: Donavan Foil M.D.   On: 06/03/2021 23:25   CT HEAD WO CONTRAST (5MM)  Result Date: 06/04/2021 CLINICAL DATA:  Initial  evaluation for neuro deficit, stroke suspected. EXAM: CT HEAD WITHOUT CONTRAST TECHNIQUE: Contiguous axial images were obtained from the base of the skull through the vertex without intravenous contrast. RADIATION DOSE REDUCTION: This exam was performed according to the departmental dose-optimization program which includes automated exposure control, adjustment of the mA and/or kV according to patient size and/or use of iterative reconstruction technique. COMPARISON:  Prior CT from 06/01/2021. FINDINGS: Brain: Cerebral volume stable, and remains within normal limits. No acute intracranial hemorrhage. No acute large vessel territory infarct. No mass lesion or midline shift. No hydrocephalus or extra-axial fluid collection. Vascular: No hyperdense vessel. Skull: Scalp soft tissues demonstrate no new abnormality. Suspected anasarca. Calvarium intact. Sinuses/Orbits: Globes and orbital soft tissues demonstrate no acute finding. Scattered mucosal thickening noted about the sphenoid ethmoidal and maxillary sinuses. Few scattered superimposed small air-fluid levels noted. Trace bilateral mastoid effusions. Other: None. IMPRESSION: 1. Stable head CT with no acute intracranial abnormality. 2. Scattered paranasal sinus disease with superimposed small air-fluid levels, suggesting acute sinusitis. Associated trace mastoid effusions. Appearance is similar to previous. Electronically Signed   By: Jeannine Boga M.D.   On: 06/04/2021 01:14    US Venous Img Lower Bilateral (DVT)  Result Date: 06/04/2021 CLINICAL DATA:  Initial evaluation for bilateral lower extremity swelling. EXAM: BILATERAL LOWER EXTREMITY VENOUS DOPPLER ULTRASOUND TECHNIQUE: Gray-scale sonography with graded compression, as well as color Doppler and duplex ultrasound were performed to evaluate the lower extremity deep venous systems from the level of the common femoral vein and including the common femoral, femoral, profunda femoral, popliteal and calf veins including the posterior tibial, peroneal and gastrocnemius veins when visible. The superficial great saphenous vein was also interrogated. Spectral Doppler was utilized to evaluate flow at rest and with distal augmentation maneuvers in the common femoral, femoral and popliteal veins. COMPARISON:  None. FINDINGS: RIGHT LOWER EXTREMITY Common Femoral Vein: No evidence of thrombus. Normal compressibility, respiratory phasicity and response to augmentation. Saphenofemoral Junction: No evidence of thrombus. Normal compressibility and flow on color Doppler imaging. Profunda Femoral Vein: No evidence of thrombus. Normal compressibility and flow on color Doppler imaging. Femoral Vein: No evidence of thrombus. Normal compressibility, respiratory phasicity and response to augmentation. Popliteal Vein: No evidence of thrombus. Normal compressibility, respiratory phasicity and response to augmentation. Calf Veins: No evidence of thrombus. Normal compressibility and flow on color Doppler imaging. Superficial Great Saphenous Vein: No evidence of thrombus. Normal compressibility. Venous Reflux:  None. Other Findings:  None. LEFT LOWER EXTREMITY Common Femoral Vein: Not seen, obscured by overlying bandaging material. Saphenofemoral Junction: Not seen, obscured by overlying bandaging material. Profunda Femoral Vein: Not seen, obscured by overlying bandaging material. Femoral Vein: No evidence of thrombus. Normal compressibility, respiratory  phasicity and response to augmentation. Popliteal Vein: No evidence of thrombus. Normal compressibility, respiratory phasicity and response to augmentation. Calf Veins: No evidence of thrombus. Normal compressibility and flow on color Doppler imaging. Superficial Great Saphenous Vein: No evidence of thrombus. Normal compressibility. Venous Reflux:  None. Other Findings:  None. IMPRESSION: 1. Nonvisualization of the left common femoral vein, saphenofemoral junction, and profunda femoral vein, obscured by overlying bandaging material. 2. Otherwise no evidence of deep venous thrombosis in either lower extremity. Electronically Signed   By: Jeannine Boga M.D.   On: 06/04/2021 05:30   CT CHEST ABDOMEN PELVIS WO CONTRAST  Result Date: 06/04/2021 CLINICAL DATA:  Sepsis. EXAM: CT CHEST, ABDOMEN AND PELVIS WITHOUT CONTRAST TECHNIQUE: Multidetector CT imaging of the chest, abdomen and pelvis was performed following the standard  protocol without IV contrast. RADIATION DOSE REDUCTION: This exam was performed according to the departmental dose-optimization program which includes automated exposure control, adjustment of the mA and/or kV according to patient size and/or use of iterative reconstruction technique. COMPARISON:  Chest CT dated 05/31/2021. FINDINGS: Evaluation of this exam is limited in the absence of intravenous contrast. Evaluation is also limited due to streak artifact caused by patient's arms as well as anasarca. CT CHEST FINDINGS Cardiovascular: Top-normal cardiac size. No pericardial effusion. There is hypoattenuation of the cardiac blood pool suggestive of anemia. Clinical correlation is recommended. Mild atherosclerotic calcification of the aortic arch. The central pulmonary arteries are grossly unremarkable. Mediastinum/Nodes: Evaluation of the hilar and mediastinal structure is very limited due to factors above. No mediastinal fluid collection. Lungs/Pleura: Small bilateral pleural effusions.  There are large areas of consolidative change involving the lower lobes, right greater left. There is near complete consolidation of the right lower lobe. There is impaction of the right lower lobe bronchus as well as impaction of the left lower lobe bronchi which may represent aspiration. No pneumothorax. Tracheostomy with tip approximately 3 cm above the carina. Musculoskeletal: Diffuse chest wall edema and anasarca. No acute osseous pathology. Old healed right posterior rib fracture. CT ABDOMEN PELVIS FINDINGS No intra-abdominal free air.  Small ascites. Hepatobiliary: The liver is unremarkable. Mild periportal edema. Probable gallbladder sludge. No calcified gallstone. Pancreas: Unremarkable. No pancreatic ductal dilatation or surrounding inflammatory changes. Spleen: Normal in size without focal abnormality. Adrenals/Urinary Tract: The adrenal glands are unremarkable. There is no hydronephrosis or nephrolithiasis on either side. The urinary bladder is decompressed around a Foley catheter. Stomach/Bowel: A rectal tube is in place. No bowel obstruction. The appendix is unremarkable as visualized. Vascular/Lymphatic: The abdominal aorta and IVC are grossly unremarkable. Right femoral approach venous line with tip in the IVC inferior to the renal vein. Left femoral approach venous line with tip in the left common iliac vein. Right femoral arterial line with tip in the right external iliac artery. No portal venous gas. No adenopathy. Reproductive: The prostate and seminal vesicles are grossly unremarkable. Other: Diffuse subcutaneous edema and anasarca. Musculoskeletal: No acute or significant osseous findings. IMPRESSION: 1. Impacted bilateral lower lobe bronchi with large areas of consolidation involving the lower lobes with near complete consolidation the right lower lobe, new since the prior CT. Findings likely represent postobstructive atelectasis or pneumonia. Aspiration is not excluded. Small bilateral  pleural effusions also new since the prior CT. 2. Small ascites and anasarca. 3. Aortic Atherosclerosis (ICD10-I70.0). Electronically Signed   By: Anner Crete M.D.   On: 06/04/2021 00:07     Medications:     prismasol BGK 4/2.5 300 mL/hr at 06/04/21 0135    prismasol BGK 4/2.5 300 mL/hr at 06/04/21 0135   dextrose 30 mL/hr at 06/04/21 0746   fentaNYL infusion INTRAVENOUS 75 mcg/hr (06/04/21 1000)   norepinephrine (LEVOPHED) Adult infusion 25 mcg/min (06/04/21 0949)   piperacillin-tazobactam Stopped (06/04/21 0820)   prismasol BGK 2/2.5 dialysis solution Stopped (06/03/21 1756)   prismasol BGK 2/2.5 replacement solution Stopped (06/03/21 1756)   prismasol BGK 2/2.5 replacement solution Stopped (06/03/21 1756)   prismasol BGK 4/2.5 1,500 mL/hr at 06/04/21 0822   propofol (DIPRIVAN) infusion 10 mcg/kg/min (06/04/21 1000)   vasopressin      sodium chloride   Intravenous Once   albuterol  2.5 mg Nebulization Q6H   [START ON 06/05/2021] chlorhexidine gluconate (MEDLINE KIT)  15 mL Mouth Rinse BID   Chlorhexidine Gluconate Cloth  6  each Topical Daily   docusate  100 mg Per Tube BID   hydrocortisone sod succinate (SOLU-CORTEF) inj  50 mg Intravenous Q6H   lidocaine  1 patch Transdermal Q24H   mouth rinse  15 mL Mouth Rinse 10 times per day   multivitamin  1 tablet Per Tube QHS   nutrition supplement (JUVEN)  1 packet Per Tube BID BM   pantoprazole  40 mg Oral Daily   polyethylene glycol  17 g Per Tube Daily   fentaNYL, guaiFENesin-dextromethorphan, heparin, ipratropium-albuterol, ondansetron **OR** ondansetron (ZOFRAN) IV, oxyCODONE, sodium chloride  Assessment/ Plan:  [name] is a 26 y.o. black male with hypertension, insulin dependent diabetes mellitus type I, diabetic gastroparesis, diabetic neuropathy, tobacco use, THC use, right toe amputation, perirectal abscess, who is admitted to [facility] on 05/31/2021 for SIRS (systemic inflammatory response syndrome) ([place]) [R65.10] AKI  (acute kidney injury) ([place]) [N17.9] Symptomatic anemia [D64.9] Acute renal failure superimposed on stage 5 chronic kidney disease, not on chronic dialysis, unspecified acute renal failure type ([place]) [N17.9, N18.5] Hypertension, unspecified type [I10] Acute cough [R05.1]  Cardiac arrest with code blue 06/01/2021 at 10 am. Transferred to ICU. Intubated  Acute kidney injury on chronic kidney disease stage IV versus progression of chronic kidney disease to end stage renal disease. Baseline creatinine of 4.13, GFR of 19 on 08/26/20. History of nephrotic range proteinuria and hematuria. No history of renal biopsy. Strong family history of dialysis with mother with ESRD before passing.  - remains hemodynamically unstable.  - Continue CVVHD with net even Pre filter and post filter replacement 300 ml/hr Dialysate 1500 ml/hr Net Fluid removal rate 0 ml/hr    Acute respiratory failure Vent assisted, suspected to have aspirated Extubated then re-intubated 06/03/2021  Hypotension with cardiogenic shock - requiring vasopressors.  4. Insulin dependent Diabetes type 1 with CKD and proteinuria   Lab Results  Component Value Date   HGBA1C 5.7 (H) 05/31/2021           LOS: 4 Lazlo Tunney 1/22/202310:02 AM

## 2021-06-04 NOTE — Progress Notes (Signed)
PT Cancellation Note  Patient Details Name: Jonathan Mcmillan MRN: [id number] DOB: May 28, 1995   Cancelled Treatment:    Reason Eval/Treat Not Completed: Patient not medically ready PT orders received, chart reviewed. Pt noted to be re-intubated & not appropriate for PT at this time. Will complete current PT orders & await new ones when pt is medically appropriate - MD made aware.   Lavone Nian, PT, DPT 06/04/21, 7:43 AM   Waunita Schooner 06/04/2021, 7:42 AM

## 2021-06-04 NOTE — Consult Note (Signed)
Hematology/Oncology Consult note [facility] [phone] [phone]  Patient Care Team: [facility], [facility] as PCP - General (General Practice)   Name of the patient: Jonathan Mcmillan  [id number]  [birth year]   Date of visit: 06/04/21 REASON FOR COSULTATION:   History of presenting illness-  26 y.o. male with PMH including type 1 diabetes, chronic kidney disease stage IV, hypertension, who initially presented to [facility] ED due to 3 days of fever, chills, nonproductive cough, muscle pain diarrhea, admitted for further work-up and treatment of acute kidney injury on CKD.  Patient coded during the hospitalization, he was found to be in asystole [progression to PEA], underwent CPR and regained pulse and transfer to ICU.  Patient was extubated on 06/03/2020, reintubated on 06/04/2020. Initial CT on 05/31/2021 showed trace pericardial effusion and mild to diffuse body wall edema.  Clear lungs. 06/03/2021, repeat CT scan showed bilateral lower lobe bronchi with large area of consolidation.  Small bilateral pleural effusion.  Small ascites and anasarca.  Patient has developed severe thrombocytopenia during current admission.  Hematology was consulted for further evaluation. Patient is currently intubated.  Not able to provide history.  HPI obtained from reviewing medical records and discussed with ICU physician.  Review of Systems  Unable to perform ROS: Intubated   No Known Allergies  Patient Active Problem List   Diagnosis Date Noted   Pressure injury of skin 06/01/2021   Hypotension    Fever    Cardiopulmonary arrest ([place])    Type 2 diabetes mellitus with hypoglycemia without coma, without long-term current use of insulin ([place]) 05/31/2021   Nicotine dependence 05/31/2021   Anemia of chronic disease 05/31/2021   Acute kidney injury superimposed on CKD ([place]) 06/11/2020   Nausea    Polysubstance abuse ([place])    AKI (acute kidney  injury) ([place]) 06/10/2020   Abdominal pain 09/09/2018   Right arm cellulitis 01/26/2018   Sepsis ([place]) 03/06/2017   Malnutrition of moderate degree 05/22/2016   DKA (diabetic ketoacidoses) 05/21/2016   DM (diabetes mellitus), 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[place]) 44/92/0100   Hypertension 10/05/2010   Obesity 10/05/2010     Past Medical History:  Diagnosis Date   Cannabinoid hyperemesis syndrome    Diabetes 1.5, managed as type 1 ([place])    Gastroparesis    Perirectal abscess 06/16/2017     Past Surgical History:  Procedure Laterality Date   INCISION AND DRAINAGE PERIRECTAL ABSCESS N/A 06/16/2017   Procedure: IRRIGATION AND DEBRIDEMENT PERIRECTAL ABSCESS;  Surgeon: Florene Glen, MD;  Location: [facility] ORS;  Service: General;  Laterality: N/A;   none      Social History   Socioeconomic History   Marital status: Single    Spouse name: Not on file   Number of children: Not on file   Years of education: Not on file   Highest education level: Not on file  Occupational History   Occupation: unemployed  Tobacco Use   Smoking status: Every Day    Packs/day: 0.50    Types: Cigarettes   Smokeless tobacco: Never  Vaping Use   Vaping Use: Never used  Substance and Sexual Activity   Alcohol use: No   Drug use: Yes    Types: Marijuana   Sexual activity: Not on file  Other Topics Concern   Not on file  Social History Narrative   Not on file   Social Determinants of Health   Financial Resource Strain: Not on file  Food Insecurity: Not  on file  Transportation Needs: Not on file  Physical Activity: Not on file  Stress: Not on file  Social Connections: Not on file  Intimate Partner Violence: Not on file     Family History  Problem Relation Age of Onset   Diabetes Mother      Current Facility-Administered Medications:     prismasol BGK 4/2.5 infusion, , CRRT, Continuous, Candiss Norse, [place], MD, Last Rate: 300 mL/hr at 06/04/21 0135, New Bag at 06/04/21 0135    prismasol BGK  4/2.5 infusion, , CRRT, Continuous, Murlean Iba, MD, Last Rate: 300 mL/hr at 06/04/21 0135, New Bag at 06/04/21 0135   0.9 %  sodium chloride infusion (Manually program via Guardrails IV Fluids), , Intravenous, Once, Lang Snow, NP, Held at 06/04/21 0247   albuterol (PROVENTIL) (2.5 MG/3ML) 0.083% nebulizer solution 2.5 mg, 2.5 mg, Nebulization, Q6H, Schertz, Michele Mcalpine, MD, 2.5 mg at 06/04/21 1324   [START ON 06/05/2021] chlorhexidine gluconate (MEDLINE KIT) (PERIDEX) 0.12 % solution 15 mL, 15 mL, Mouth Rinse, BID, Schertz, Michele Mcalpine, MD   Chlorhexidine Gluconate Cloth 2 % PADS 6 each, 6 each, Topical, Daily, Loletha Grayer, MD, 6 each at 06/01/21 1231   dextrose 10 % infusion, , Intravenous, Continuous, Schertz, Michele Mcalpine, MD, Last Rate: 30 mL/hr at 06/04/21 0746, Rate Change at 06/04/21 0746   docusate (COLACE) 50 MG/5ML liquid 100 mg, 100 mg, Per Tube, BID, Darel Hong D, NP   fentaNYL (SUBLIMAZE) bolus via infusion 50-100 mcg, 50-100 mcg, Intravenous, Q1H PRN, Lang Snow, NP   fentaNYL 256mg in NS 252m(1022mml) infusion-PREMIX, 50-200 mcg/hr, Intravenous, Continuous, Ouma, EliBing NeighborsP, Last Rate: 10 mL/hr at 06/04/21 1400, 100 mcg/hr at 06/04/21 1400   guaiFENesin-dextromethorphan (ROBITUSSIN DM) 100-10 MG/5ML syrup 10 mL, 10 mL, Oral, Q6H PRN, Agbata, Tochukwu, MD, 10 mL at 05/31/21 1652   heparin injection 1,000-6,000 Units, 1,000-6,000 Units, CRRT, PRN, Kolluru, Sarath, MD, 2,600 Units at 06/03/21 2207   hydrocortisone sodium succinate (SOLU-CORTEF) 100 MG injection 50 mg, 50 mg, Intravenous, Q6H, SchBennie PieriniD, 50 mg at 06/04/21 0950   ipratropium-albuterol (DUONEB) 0.5-2.5 (3) MG/3ML nebulizer solution 3 mL, 3 mL, Nebulization, Q6H PRN, Ouma, EliBing NeighborsP   lidocaine (LIDODERM) 5 % 1 patch, 1 patch, Transdermal, Q24H, Ouma, EliBing NeighborsP, 1 patch at 06/03/21 2143   MEDLINE mouth rinse, 15 mL, Mouth Rinse, 10 times per  day, SchBennie PieriniD, 15 mL at 06/04/21 1123   multivitamin (RENA-VIT) tablet 1 tablet, 1 tablet, Per Tube, QHS, BreNelle DonD, 1 tablet at 06/02/21 2139   norepinephrine (LEVOPHED) 16 mg in 250m32memix infusion, 0-40 mcg/min, Intravenous, Titrated, ScheBennie Pierini, Last Rate: 16.88 mL/hr at 06/04/21 1400, 18 mcg/min at 06/04/21 1400   nutrition supplement (JUVEN) (JUVEN) powder packet 1 packet, 1 packet, Per Tube, BID BM, BresNelle Don, 1 packet at 06/04/21 0950   ondansetron (ZOFRAN) tablet 4 mg, 4 mg, Oral, Q6H PRN **OR** ondansetron (ZOFRAN) injection 4 mg, 4 mg, Intravenous, Q6H PRN, Agbata, Tochukwu, MD   oxyCODONE (Oxy IR/ROXICODONE) immediate release tablet 5 mg, 5 mg, Oral, Q4H PRN, ScheJonnie FinneramMichele Mcalpine   pantoprazole (PROTONIX) EC tablet 40 mg, 40 mg, Oral, Daily, ScheBennie Pierini, 40 mg at 06/04/21 0950   piperacillin-tazobactam (ZOSYN) IVPB 3.375 g, 3.375 g, Intravenous, Q6H, ChapBenita GutterH, Stopped at 06/04/21 1323   polyethylene glycol (MIRALAX / GLYCOLAX) packet 17 g, 17 g, Per Tube, Daily, KeenBradly Bienenstock  prismasol BGK 2/2.5 dialysis solution, , CRRT, Continuous, Kolluru, Sarath, MD, Stopped at 06/03/21 1756   prismasol BGK 2/2.5 replacement solution, , CRRT, Continuous, Kolluru, Sarath, MD, Stopped at 06/03/21 1756   prismasol BGK 2/2.5 replacement solution, , CRRT, Continuous, Kolluru, Sarath, MD, Stopped at 06/03/21 1756   prismasol BGK 4/2.5 infusion, , CRRT, Continuous, Murlean Iba, MD, Last Rate: 1,500 mL/hr at 06/04/21 1125, New Bag at 06/04/21 1125   propofol (DIPRIVAN) 1000 MG/100ML infusion, 0-50 mcg/kg/min, Intravenous, Continuous, Ouma, Bing Neighbors, NP, Last Rate: 6.54 mL/hr at 06/04/21 1400, 15 mcg/kg/min at 06/04/21 1400   sodium chloride 0.9 % primer fluid for CRRT, , CRRT, PRN, Kolluru, Sarath, MD   sodium phosphate 20 mmol in dextrose 5 % 250 mL infusion, 20 mmol, Intravenous, Once, Ellington, Abby K, RPH    vasopressin (PITRESSIN) 20 Units in sodium chloride 0.9 % 100 mL infusion-*FOR SHOCK*, 0-0.03 Units/min, Intravenous, Continuous, Bennie Pierini, MD, Last Rate: 9 mL/hr at 06/04/21 1400, 0.03 Units/min at 06/04/21 1400   Physical exam:  Vitals:   06/04/21 1230 06/04/21 1300 06/04/21 1324 06/04/21 1330  BP: 111/66 108/65  113/65  Pulse: (!) 114 (!) 113  (!) 114  Resp:      Temp: 98.1 F (36.7 C) 98.2 F (36.8 C)  98.4 F (36.9 C)  TempSrc:      SpO2:  100% 100% 100%  Weight:      Height:       Physical Exam HENT:     Head: Normocephalic.  Cardiovascular:     Rate and Rhythm: Tachycardia present.     Heart sounds: No murmur heard. Pulmonary:     Comments: Intubated Abdominal:     Palpations: Abdomen is soft.  Musculoskeletal:        General: No swelling.     Cervical back: Neck supple.     Comments: History of right toe amputation  Skin:    General: Skin is warm.     Comments: +Bair hugger warming device         CMP Latest Ref Rng & Units 06/04/2021  Glucose 70 - 99 mg/dL 175(H)  BUN 6 - 20 mg/dL 15  Creatinine 0.61 - 1.24 mg/dL 1.54(H)  Sodium 135 - 145 mmol/L 133(L)  Potassium 3.5 - 5.1 mmol/L 3.2(L)  Chloride 98 - 111 mmol/L 103  CO2 22 - 32 mmol/L 22  Calcium 8.9 - 10.3 mg/dL 7.9(L)  Total Protein 6.5 - 8.1 g/dL -  Total Bilirubin 0.3 - 1.2 mg/dL -  Alkaline Phos 38 - 126 U/L -  AST 15 - 41 U/L -  ALT 0 - 44 U/L -   CBC Latest Ref Rng & Units 06/04/2021  WBC 4.0 - 10.5 K/uL 25.2(H)  Hemoglobin 13.0 - 17.0 g/dL 8.8(L)  Hematocrit 39.0 - 52.0 % 24.6(L)  Platelets 150 - 400 K/uL 10(LL)    RADIOGRAPHIC STUDIES: I have personally reviewed the radiological images as listed and agreed with the findings in the report. DG Chest 1 View  Result Date: 06/03/2021 CLINICAL DATA:  Post intubation EXAM: CHEST  1 VIEW COMPARISON:  06/02/2021, 05/31/2021, 03/08/2020 FINDINGS: Tip of the endotracheal tube is about 3.3 cm superior to carina. Cardiomegaly with  bilateral effusions and worsened basilar airspace disease. Vascular congestion. Esophageal tube has been removed. IMPRESSION: 1. Endotracheal tube tip about 3.3 cm superior to carina. 2. Cardiomegaly with interim development of bilateral effusions and basilar airspace disease. Vascular congestion and mild edema. Overall aeration is worse as compared with  06/02/2021. Electronically Signed   By: Donavan Foil M.D.   On: 06/03/2021 23:24   DG Chest 2 View  Result Date: 05/31/2021 CLINICAL DATA:  Cough.  Fever. EXAM: CHEST - 2 VIEW COMPARISON:  Multiple chest XRs, most recently 03/08/2020. CT chest, 01/02/2020. FINDINGS: Cardiomediastinal silhouette is within normal limits given positioning and technique. Lungs are well inflated. No focal consolidation or mass. No pleural effusion or pneumothorax. No acute displaced fracture. IMPRESSION: No active cardiopulmonary disease. Electronically Signed   By: Michaelle Birks M.D.   On: 05/31/2021 07:34   DG Abd 1 View  Result Date: 06/04/2021 CLINICAL DATA:  OG tube. EXAM: ABDOMEN - 1 VIEW COMPARISON:  CT CAP, 06/03/2021.  KUB, 06/03/2021 and 06/01/2021. FINDINGS: Support lines: Enteric tube with tip and side port within stomach. RIGHT femoral dialysis catheter, incompletely imaged. Overlying pacer leads. The bowel gas pattern is normal.  No interval osseous abnormality. IMPRESSION: 1. Well-positioned enteric feeding tube, with tip and side port within stomach. Additional lines and tubes as above. 2. Nonobstructed bowel-gas. Electronically Signed   By: Michaelle Birks M.D.   On: 06/04/2021 08:39   DG Abd 1 View  Result Date: 06/03/2021 CLINICAL DATA:  Distension EXAM: ABDOMEN - 1 VIEW COMPARISON:  06/01/2021 FINDINGS: Nonobstructed gas pattern with paucity of bowel gas. Bilateral femoral catheters, right catheter tip over lies the right aspect of the L2 vertebral body. Left catheter tip overlies the upper aspect of left sacrum. IMPRESSION: Nonobstructed gas pattern with  overall paucity of bowel gas. Esophageal tube has been removed. Electronically Signed   By: Donavan Foil M.D.   On: 06/03/2021 23:25   DG Abd 1 View  Result Date: 06/01/2021 CLINICAL DATA:  Encounter for feeding tube placement EXAM: ABDOMEN - 1 VIEW COMPARISON:  X-ray abdomen 06/11/2020. FINDINGS: Enteric tube coursing below the hemidiaphragm with tip and side port overlying the gastric lumen. Tip likely in the region of the pylorus/first portion of the duodenum. Bilateral femoral approach catheters noted with left catheter overlying the L5 vertebral body and right catheter with tip overlying the right aspect of the L1 vertebral body. Nonobstructive bowel gas pattern. Slight distension of the small bowel with gas. No radio-opaque calculi or other significant radiographic abnormality are seen. IMPRESSION: Enteric tube coursing below within the gastric lumen with tip overlying the expected region of the pylorus. Consider retracting by 3 cm. Electronically Signed   By: Iven Finn M.D.   On: 06/01/2021 18:11   CT HEAD WO CONTRAST (5MM)  Result Date: 06/04/2021 CLINICAL DATA:  Initial evaluation for neuro deficit, stroke suspected. EXAM: CT HEAD WITHOUT CONTRAST TECHNIQUE: Contiguous axial images were obtained from the base of the skull through the vertex without intravenous contrast. RADIATION DOSE REDUCTION: This exam was performed according to the departmental dose-optimization program which includes automated exposure control, adjustment of the mA and/or kV according to patient size and/or use of iterative reconstruction technique. COMPARISON:  Prior CT from 06/01/2021. FINDINGS: Brain: Cerebral volume stable, and remains within normal limits. No acute intracranial hemorrhage. No acute large vessel territory infarct. No mass lesion or midline shift. No hydrocephalus or extra-axial fluid collection. Vascular: No hyperdense vessel. Skull: Scalp soft tissues demonstrate no new abnormality. Suspected  anasarca. Calvarium intact. Sinuses/Orbits: Globes and orbital soft tissues demonstrate no acute finding. Scattered mucosal thickening noted about the sphenoid ethmoidal and maxillary sinuses. Few scattered superimposed small air-fluid levels noted. Trace bilateral mastoid effusions. Other: None. IMPRESSION: 1. Stable head CT with no acute intracranial abnormality. 2.  Scattered paranasal sinus disease with superimposed small air-fluid levels, suggesting acute sinusitis. Associated trace mastoid effusions. Appearance is similar to previous. Electronically Signed   By: Jeannine Boga M.D.   On: 06/04/2021 01:14   CT HEAD WO CONTRAST (5MM)  Result Date: 06/01/2021 CLINICAL DATA:  Altered mental status EXAM: CT HEAD WITHOUT CONTRAST TECHNIQUE: Contiguous axial images were obtained from the base of the skull through the vertex without intravenous contrast. RADIATION DOSE REDUCTION: This exam was performed according to the departmental dose-optimization program which includes automated exposure control, adjustment of the mA and/or kV according to patient size and/or use of iterative reconstruction technique. COMPARISON:  05/21/2016 FINDINGS: Brain: No acute intracranial findings are seen. There are no signs of bleeding within the cranium. Ventricles are not dilated. There is no shift of midline structures. There are no epidural or subdural fluid collections. Vascular: Unremarkable. Skull: Unremarkable. Sinuses/Orbits: Air-fluid level is seen in the sphenoid sinus. There is mucosal thickening in the ethmoid and maxillary sinuses. Small air-fluid levels are seen in both maxillary sinuses. Other: None IMPRESSION: No acute intracranial findings are seen in noncontrast CT brain. Chronic sinusitis. Air-fluid levels are seen in the sphenoid and maxillary sinuses suggesting possible acute sinusitis. Electronically Signed   By: Elmer Picker M.D.   On: 06/01/2021 17:03   CT CHEST WO CONTRAST  Result Date:  05/31/2021 CLINICAL DATA:  Body aches, chills, nausea since Sunday. EXAM: CT CHEST WITHOUT CONTRAST TECHNIQUE: Multidetector CT imaging of the chest was performed following the standard protocol without IV contrast. RADIATION DOSE REDUCTION: This exam was performed according to the departmental dose-optimization program which includes automated exposure control, adjustment of the mA and/or kV according to patient size and/or use of iterative reconstruction technique. COMPARISON:  Same day chest radiograph, CT chest 01/02/2020 FINDINGS: Cardiovascular: The heart is not enlarged. There is trace pericardial fluid. The blood pool is hypodense suggesting anemia. The vasculature is unremarkable, as can be assessed in the absence of intravenous contrast. Mediastinum/Nodes: The thyroid is unremarkable. The esophagus is grossly unremarkable. There is no mediastinal or axillary lymphadenopathy. There is no bulky hilar adenopathy, suboptimally evaluated in the absence of intravenous contrast. Lungs/Pleura: The trachea and central airways are patent. The lungs are well inflated. There is no focal consolidation or pulmonary edema. There is no pleural effusion or pneumothorax. Upper Abdomen: The imaged portions of the upper abdominal viscera are unremarkable. Musculoskeletal: Multiple remote right-sided rib fractures are seen. There is no acute osseous abnormality or aggressive osseous lesion. Other: There is mild diffuse body wall edema. IMPRESSION: 1. Trace pericardial effusion and mild diffuse body wall edema, new since 01/02/2020. 2. Clear lungs, with no focal consolidation or pleural effusion. 3. Hypodense blood pool consistent with anemia. Electronically Signed   By: Valetta Mole M.D.   On: 05/31/2021 12:08   US RENAL  Result Date: 05/31/2021 CLINICAL DATA:  Acute kidney injury EXAM: RENAL / URINARY TRACT ULTRASOUND COMPLETE COMPARISON:  06/10/2020 FINDINGS: Right Kidney: Renal measurements: 10.9 x 6.1 x 7.0 cm =  volume: 244 mL. Increased cortical echogenicity with medical renal disease. Trace retroperitoneal perinephric edema. No hydronephrosis focal abnormality. Left Kidney: Renal measurements: 11.1 x 6.9 x 6.5 cm = volume: 257 mL. Similar increased cortical echogenicity compatible with medical renal disease. Similar trace retroperitoneal perinephric fluid/edema. No hydronephrosis. No focal abnormality. Bladder: Nearly collapsed, accounting for wall prominence Other: None. IMPRESSION: Increased renal echogenicity compatible with medical renal disease. Retroperitoneal perinephric edema suspect related to volume overload. Electronically Signed  By: Eugenie Filler M.D.   On: 05/31/2021 14:01   US Venous Img Lower Bilateral (DVT)  Result Date: 06/04/2021 CLINICAL DATA:  Initial evaluation for bilateral lower extremity swelling. EXAM: BILATERAL LOWER EXTREMITY VENOUS DOPPLER ULTRASOUND TECHNIQUE: Gray-scale sonography with graded compression, as well as color Doppler and duplex ultrasound were performed to evaluate the lower extremity deep venous systems from the level of the common femoral vein and including the common femoral, femoral, profunda femoral, popliteal and calf veins including the posterior tibial, peroneal and gastrocnemius veins when visible. The superficial great saphenous vein was also interrogated. Spectral Doppler was utilized to evaluate flow at rest and with distal augmentation maneuvers in the common femoral, femoral and popliteal veins. COMPARISON:  None. FINDINGS: RIGHT LOWER EXTREMITY Common Femoral Vein: No evidence of thrombus. Normal compressibility, respiratory phasicity and response to augmentation. Saphenofemoral Junction: No evidence of thrombus. Normal compressibility and flow on color Doppler imaging. Profunda Femoral Vein: No evidence of thrombus. Normal compressibility and flow on color Doppler imaging. Femoral Vein: No evidence of thrombus. Normal compressibility, respiratory phasicity and  response to augmentation. Popliteal Vein: No evidence of thrombus. Normal compressibility, respiratory phasicity and response to augmentation. Calf Veins: No evidence of thrombus. Normal compressibility and flow on color Doppler imaging. Superficial Great Saphenous Vein: No evidence of thrombus. Normal compressibility. Venous Reflux:  None. Other Findings:  None. LEFT LOWER EXTREMITY Common Femoral Vein: Not seen, obscured by overlying bandaging material. Saphenofemoral Junction: Not seen, obscured by overlying bandaging material. Profunda Femoral Vein: Not seen, obscured by overlying bandaging material. Femoral Vein: No evidence of thrombus. Normal compressibility, respiratory phasicity and response to augmentation. Popliteal Vein: No evidence of thrombus. Normal compressibility, respiratory phasicity and response to augmentation. Calf Veins: No evidence of thrombus. Normal compressibility and flow on color Doppler imaging. Superficial Great Saphenous Vein: No evidence of thrombus. Normal compressibility. Venous Reflux:  None. Other Findings:  None. IMPRESSION: 1. Nonvisualization of the left common femoral vein, saphenofemoral junction, and profunda femoral vein, obscured by overlying bandaging material. 2. Otherwise no evidence of deep venous thrombosis in either lower extremity. Electronically Signed   By: Jeannine Boga M.D.   On: 06/04/2021 05:30   DG Chest Port 1 View  Result Date: 06/02/2021 CLINICAL DATA:  Acute respiratory failure, hypoxia EXAM: PORTABLE CHEST 1 VIEW COMPARISON:  Chest radiograph 1 day prior FINDINGS: The endotracheal tube is approximately 2.6 cm from the carina. The enteric catheter tip is off the field of view. A presumed esophageal temperature probe terminates in the upper thorax. The cardiac silhouette is enlarged, unchanged. The mediastinal contours are stable. There are patchy opacities in the lung bases, particularly on the right, likely not significantly changed in the  interim. There is no significant pleural effusion. There is no pneumothorax. There is no acute osseous abnormality. IMPRESSION: 1. Unchanged enlargement of the cardiac silhouette. 2. Patchy bibasilar opacities are not significantly changed and may reflect atelectasis or developing infection. 3. Presumed esophageal temperature probe terminating in the upper thorax. Endotracheal tube in satisfactory position. Enteric catheter tip is off the field of view. Electronically Signed   By: Valetta Mole M.D.   On: 06/02/2021 07:25   DG Chest Port 1 View  Result Date: 06/01/2021 CLINICAL DATA:  Intubation. EXAM: PORTABLE CHEST 1 VIEW COMPARISON:  Chest radiographs and CT 05/31/2021 FINDINGS: An endotracheal tube has been placed and terminates proximally 2.5 cm above the carina. Telemetry leads overlie the chest. The cardiac silhouette is accentuated by portable AP technique although true  mild interval enlargement, such as from a pericardial effusion, is not excluded. Lung volumes are lower than on the prior radiographs, and there are mild opacities in both lung bases. No sizable pleural effusion or pneumothorax is identified. IMPRESSION: 1. Endotracheal tube in appropriate position. 2. Low lung volumes with mild bibasilar opacities which may reflect atelectasis or infection. Electronically Signed   By: Logan Bores M.D.   On: 06/01/2021 13:56   ECHOCARDIOGRAM COMPLETE  Result Date: 06/01/2021    ECHOCARDIOGRAM REPORT   Patient Name:   TAVARI LOADHOLT Date of Exam: 06/01/2021 Medical Rec #:  [id number]       Height:       68.0 in Accession #:    [id number]      Weight:       145.0 lb Date of Birth:  16-Apr-1996       BSA:          1.783 m Patient Age:    25 years        BP:           155/83 mmHg Patient Gender: M               HR:           95 bpm. Exam Location:  [facility] Procedure: 2D Echo, Color Doppler, Cardiac Doppler and Strain Analysis Indications:     I46.9 Cardiac arrest  History:         Patient has no prior history  of Echocardiogram examinations.                  ESRD, stage IV, Signs/Symptoms:Fever; Risk Factors:Diabetes.  Sonographer:     Charmayne Sheer Referring Phys:  [id number] Bradly Bienenstock Diagnosing Phys: Ida Rogue MD  Sonographer Comments: Echo performed with patient supine and on artificial respirator. Global longitudinal strain was attempted. IMPRESSIONS  1. Left ventricular ejection fraction, by estimation, is 35 to 40%. The left ventricle has moderately decreased function. The left ventricle demonstrates global hypokinesis. There is moderate left ventricular hypertrophy. Left ventricular diastolic parameters are consistent with Grade II diastolic dysfunction (pseudonormalization). The average left ventricular global longitudinal strain is -11.3 %. The global longitudinal strain is abnormal.  2. Right ventricular systolic function is mildly reduced. The right ventricular size is normal. There is moderately elevated pulmonary artery systolic pressure. The estimated right ventricular systolic pressure is 22.2 mmHg.  3. Left atrial size was mildly dilated.  4. The mitral valve is normal in structure. Mild mitral valve regurgitation. No evidence of mitral stenosis.  5. The aortic valve is normal in structure. Aortic valve regurgitation is mild. No aortic stenosis is present.  6. The inferior vena cava is dilated in size with <50% respiratory variability, suggesting right atrial pressure of 15 mmHg. FINDINGS  Left Ventricle: Left ventricular ejection fraction, by estimation, is 35 to 40%. The left ventricle has moderately decreased function. The left ventricle demonstrates global hypokinesis. The average left ventricular global longitudinal strain is -11.3 %. The global longitudinal strain is abnormal. The left ventricular internal cavity size was normal in size. There is moderate left ventricular hypertrophy. Left ventricular diastolic parameters are consistent with Grade II diastolic dysfunction  (pseudonormalization). Right Ventricle: The right ventricular size is normal. No increase in right ventricular wall thickness. Right ventricular systolic function is mildly reduced. There is moderately elevated pulmonary artery systolic pressure. The tricuspid regurgitant velocity is 2.80 m/s, and with an assumed right atrial pressure of 15 mmHg, the estimated right  ventricular systolic pressure is 18.5 mmHg. Left Atrium: Left atrial size was mildly dilated. Right Atrium: Right atrial size was normal in size. Pericardium: There is no evidence of pericardial effusion. Mitral Valve: The mitral valve is normal in structure. Mild mitral valve regurgitation. No evidence of mitral valve stenosis. MV peak gradient, 5.4 mmHg. The mean mitral valve gradient is 3.0 mmHg. Tricuspid Valve: The tricuspid valve is normal in structure. Tricuspid valve regurgitation is mild . No evidence of tricuspid stenosis. Aortic Valve: The aortic valve is normal in structure. Aortic valve regurgitation is mild. No aortic stenosis is present. Aortic valve mean gradient measures 6.0 mmHg. Aortic valve peak gradient measures 10.1 mmHg. Aortic valve area, by VTI measures 2.64  cm. Pulmonic Valve: The pulmonic valve was normal in structure. Pulmonic valve regurgitation is trivial. No evidence of pulmonic stenosis. Aorta: The aortic root is normal in size and structure. Venous: The inferior vena cava is dilated in size with less than 50% respiratory variability, suggesting right atrial pressure of 15 mmHg. IAS/Shunts: No atrial level shunt detected by color flow Doppler.  LEFT VENTRICLE PLAX 2D LVIDd:         4.90 cm   Diastology LVIDs:         3.79 cm   LV e' medial:    8.59 cm/s LV PW:         1.43 cm   LV E/e' medial:  14.4 LV IVS:        0.93 cm   LV e' lateral:   9.46 cm/s LVOT diam:     2.10 cm   LV E/e' lateral: 13.1 LV SV:         58 LV SV Index:   33        2D Longitudinal Strain LVOT Area:     3.46 cm  2D Strain GLS Avg:     -11.3 %   RIGHT VENTRICLE RV Basal diam:  3.95 cm RV S prime:     11.40 cm/s LEFT ATRIUM             Index        RIGHT ATRIUM           Index LA diam:        4.40 cm 2.47 cm/m   RA Area:     20.10 cm LA Vol (A2C):   77.6 ml 43.53 ml/m  RA Volume:   60.00 ml  33.66 ml/m LA Vol (A4C):   73.9 ml 41.45 ml/m LA Biplane Vol: 79.4 ml 44.54 ml/m  AORTIC VALVE                     PULMONIC VALVE AV Area (Vmax):    2.40 cm      PV Vmax:       1.12 m/s AV Area (Vmean):   2.23 cm      PV Vmean:      82.500 cm/s AV Area (VTI):     2.64 cm      PV VTI:        0.187 m AV Vmax:           159.00 cm/s   PV Peak grad:  5.0 mmHg AV Vmean:          113.000 cm/s  PV Mean grad:  3.0 mmHg AV VTI:            0.220 m AV Peak Grad:      10.1 mmHg AV Mean  Grad:      6.0 mmHg LVOT Vmax:         110.00 cm/s LVOT Vmean:        72.600 cm/s LVOT VTI:          0.168 m LVOT/AV VTI ratio: 0.76  AORTA Ao Root diam: 2.80 cm MITRAL VALVE                TRICUSPID VALVE MV Area (PHT): 5.16 cm     TR Peak grad:   31.4 mmHg MV Area VTI:   2.47 cm     TR Vmax:        280.00 cm/s MV Peak grad:  5.4 mmHg MV Mean grad:  3.0 mmHg     SHUNTS MV Vmax:       1.16 m/s     Systemic VTI:  0.17 m MV Vmean:      77.4 cm/s    Systemic Diam: 2.10 cm MV Decel Time: 147 msec MV E velocity: 124.00 cm/s MV A velocity: 54.40 cm/s MV E/A ratio:  2.28 Ida Rogue MD Electronically signed by Ida Rogue MD Signature Date/Time: 06/01/2021/6:41:43 PM    Final    CT CHEST ABDOMEN PELVIS WO CONTRAST  Result Date: 06/04/2021 CLINICAL DATA:  Sepsis. EXAM: CT CHEST, ABDOMEN AND PELVIS WITHOUT CONTRAST TECHNIQUE: Multidetector CT imaging of the chest, abdomen and pelvis was performed following the standard protocol without IV contrast. RADIATION DOSE REDUCTION: This exam was performed according to the departmental dose-optimization program which includes automated exposure control, adjustment of the mA and/or kV according to patient size and/or use of iterative reconstruction  technique. COMPARISON:  Chest CT dated 05/31/2021. FINDINGS: Evaluation of this exam is limited in the absence of intravenous contrast. Evaluation is also limited due to streak artifact caused by patient's arms as well as anasarca. CT CHEST FINDINGS Cardiovascular: Top-normal cardiac size. No pericardial effusion. There is hypoattenuation of the cardiac blood pool suggestive of anemia. Clinical correlation is recommended. Mild atherosclerotic calcification of the aortic arch. The central pulmonary arteries are grossly unremarkable. Mediastinum/Nodes: Evaluation of the hilar and mediastinal structure is very limited due to factors above. No mediastinal fluid collection. Lungs/Pleura: Small bilateral pleural effusions. There are large areas of consolidative change involving the lower lobes, right greater left. There is near complete consolidation of the right lower lobe. There is impaction of the right lower lobe bronchus as well as impaction of the left lower lobe bronchi which may represent aspiration. No pneumothorax. Tracheostomy with tip approximately 3 cm above the carina. Musculoskeletal: Diffuse chest wall edema and anasarca. No acute osseous pathology. Old healed right posterior rib fracture. CT ABDOMEN PELVIS FINDINGS No intra-abdominal free air.  Small ascites. Hepatobiliary: The liver is unremarkable. Mild periportal edema. Probable gallbladder sludge. No calcified gallstone. Pancreas: Unremarkable. No pancreatic ductal dilatation or surrounding inflammatory changes. Spleen: Normal in size without focal abnormality. Adrenals/Urinary Tract: The adrenal glands are unremarkable. There is no hydronephrosis or nephrolithiasis on either side. The urinary bladder is decompressed around a Foley catheter. Stomach/Bowel: A rectal tube is in place. No bowel obstruction. The appendix is unremarkable as visualized. Vascular/Lymphatic: The abdominal aorta and IVC are grossly unremarkable. Right femoral approach venous  line with tip in the IVC inferior to the renal vein. Left femoral approach venous line with tip in the left common iliac vein. Right femoral arterial line with tip in the right external iliac artery. No portal venous gas. No adenopathy. Reproductive: The prostate and seminal vesicles are grossly  unremarkable. Other: Diffuse subcutaneous edema and anasarca. Musculoskeletal: No acute or significant osseous findings. IMPRESSION: 1. Impacted bilateral lower lobe bronchi with large areas of consolidation involving the lower lobes with near complete consolidation the right lower lobe, new since the prior CT. Findings likely represent postobstructive atelectasis or pneumonia. Aspiration is not excluded. Small bilateral pleural effusions also new since the prior CT. 2. Small ascites and anasarca. 3. Aortic Atherosclerosis (ICD10-I70.0). Electronically Signed   By: Anner Crete M.D.   On: 06/04/2021 00:07    Assessment and plan-   #Acute on chronic anemia and severe thrombocytopenia Patient presented with a platelet count of 172,000. Progressively trended down during his hospitalization. Currently at 10,000.  No active bleeding. HIT and ADAMTS 13, haptoglobin have been sent.  Results are pending. Check immature platelet fraction, B12, folate, LDH, reticulocyte panel. Fibrinogen normal, less likely DIC. Patient has normal LDH, indirect bilirubin level, less likely hemolysis. Immature platelet fraction was checked which was significantly increased, indicating possible peripheral destruction. I reviewed his peripheral blood smear, there were few schistocytes, not significant amount.  Less likely microangiopathic hemolysis. Thrombocytopenia, likely multifactorial due to severe sepsis. There may a component of peripheral destruction.  ?ITP, ?DITP from vancomycin and Zosyn. Consider switch to other antibiotics if ok from infectious aspect.   Acute hypoxic respiratory failure, aspiration pneumonia, septic  shock.  Lactic acidosis Managed by ICU.  Acute on chronic renal failure.  On CRRT.  Thank you for allowing me to participate in the care of this patient.    Earlie Server, MD, PhD Hematology Oncology  06/04/2021

## 2021-06-04 NOTE — Progress Notes (Addendum)
° °  BRIEF OVERNIGHT PROGRESS REPORT   SUBJECTIVE: Notified by patient's primary nurse that patient was unresponsive and hypoxic. CRRT stopped  OBJECTIVE:On arrival to the bedside, he was hypothermic 95.9 (35.5) with blood pressure 77/37 mm Hg and pulse rate 74 beats/min, sats 61% on 2L [place]. Patient was obtunded and hypoxic with sats in the mid 60's. He required BVM ventilation. Patient was emergently intubated for airway protection.  Diagnostics; STAT  CT Head, Chest, Abdomen and Pelvis obtained which showed no acute intracranial abnormality.  CT chest abdomen pelvis shows Impacted bilateral lower lobe bronchi with large areas of consolidation involving the lower lobes with near complete consolidation the right lower lobe, new since the prior CT. Findings likely represent postobstructive atelectasis or pneumonia. Aspiration is not excluded. Small bilateral pleural effusions also new since the prior CT. 2. Small ascites and anasarca.  ASSESSMENT&PLAN: 26 y.o. admitted with AKI on CKD in setting of viral syndrome and diarrhea.  Hospital course complicated by PEA cardiac arrest suspect due to severe metabolic acidosis and multiple metabolic derangements.  Acute Hypoxic Respiratory Failure secondary to Bilateral Pulmonary Edema & Bilateral Pneumonia Extubated 1/21, Re-intubated 1/21 due to respiratory distress and unresponsiveness -continue ventilator support & lung protective strategies -Wean PEEP & FiO2 as tolerated, maintain SpO2 > 90% -Head of bed elevated 30 degrees, VAP protocol in place -Plateau pressures less than 30 cm H20  -Intermittent chest x-ray & ABG PRN -Daily WUA with SBT per protocol -Ensure adequate pulmonary hygiene  -Budesonide inhaler nebs BID, Bronchodilators PRN and Scheduled -wean sedation/analgesia for RASS goal 0  Severe thrombocytopenia  Rapid decrease in platelets (>50% fall in 24-48 hours), 124>100>76>29>14>13 ?HUS - presenting with 3-day history of fever, chills,  nonproductive cough, myalgias and diarrhea -Will transfuse 1 unit of platelet to assess for responsiveness, will check plts after an hour of transfusion to eval for  immune platelet consumption if no improvement. -Check SRA,  -ADAMTS13 activity, Haptoglobin and HIT antibody pending -Check Coagulation studies (INR, PTT). -Check DIC labs (fibrinogen, D-dimer). -Check EBV, RSV, CMV, Varicella -Eval for DVT -Hold Heparin products for now    Rufina Falco, DNP, CCRN, FNP-C, AGACNP-BC Acute Care Nurse Practitioner  [facility] Pager: [phone] [place] [facility]  .

## 2021-06-04 NOTE — Progress Notes (Signed)
Ventilator rate changed to 20 per discussion with NP.

## 2021-06-04 NOTE — Progress Notes (Signed)
Patient filter pressures elevated. CRRT interrupted for set change.   2300: Patient has increased lethargy, no longer restless or attempting to converse. O2 sats begin to drop. High flow [place] and nonrebreather attempted and failed to bring up O2 sats. NP notified and came to bedside. Intubation performed. Pt became very hypotensive, Levophed increased and patient became HTN/Tachycardic. Quickly resolved.   2355: Patient transported to CT. No acute events during transport.   0135: CRRT restarted with increased fluid removal .

## 2021-06-05 ENCOUNTER — Inpatient Hospital Stay (HOSPITAL_COMMUNITY)
Admit: 2021-06-05 | Discharge: 2021-06-05 | Disposition: A | Discharge status: Home / Self Care | Payer: Medicaid Other | Attending: Internal Medicine | Admitting: Internal Medicine

## 2021-06-05 DIAGNOSIS — I469 Cardiac arrest, cause unspecified: Secondary | ICD-10-CM

## 2021-06-05 DIAGNOSIS — I5020 Unspecified systolic (congestive) heart failure: Secondary | ICD-10-CM

## 2021-06-05 DIAGNOSIS — N189 Chronic kidney disease, unspecified: Secondary | ICD-10-CM

## 2021-06-05 DIAGNOSIS — J9601 Acute respiratory failure with hypoxia: Secondary | ICD-10-CM

## 2021-06-05 DIAGNOSIS — D638 Anemia in other chronic diseases classified elsewhere: Secondary | ICD-10-CM

## 2021-06-05 LAB — TRANSFUSION REACTION
DAT C3: NEGATIVE
Post RXN DAT IgG: NEGATIVE

## 2021-06-05 LAB — GLUCOSE, CAPILLARY
Glucose-Capillary: 152 mg/dL — ABNORMAL HIGH (ref 70–99)
Glucose-Capillary: 147 mg/dL — ABNORMAL HIGH (ref 70–99)
Glucose-Capillary: 177 mg/dL — ABNORMAL HIGH (ref 70–99)
Glucose-Capillary: 177 mg/dL — ABNORMAL HIGH (ref 70–99)
Glucose-Capillary: 171 mg/dL — ABNORMAL HIGH (ref 70–99)
Glucose-Capillary: 162 mg/dL — ABNORMAL HIGH (ref 70–99)

## 2021-06-05 LAB — PREPARE PLATELET PHERESIS: Unit division: 0

## 2021-06-05 LAB — CBC WITH DIFFERENTIAL/PLATELET
Abs Immature Granulocytes: 1.57 10*3/uL — ABNORMAL HIGH (ref 0.00–0.07)
Basophils Absolute: 0 10*3/uL (ref 0.0–0.1)
Basophils Relative: 0 %
Eosinophils Absolute: 0.2 10*3/uL (ref 0.0–0.5)
Eosinophils Relative: 1 %
HCT: 23.8 % — ABNORMAL LOW (ref 39.0–52.0)
Hemoglobin: 8.3 g/dL — ABNORMAL LOW (ref 13.0–17.0)
Immature Granulocytes: 5 %
Lymphocytes Relative: 6 %
Lymphs Abs: 2 10*3/uL (ref 0.7–4.0)
MCH: 30.3 pg (ref 26.0–34.0)
MCHC: 34.9 g/dL (ref 30.0–36.0)
MCV: 86.9 fL (ref 80.0–100.0)
Monocytes Absolute: 0.3 10*3/uL (ref 0.1–1.0)
Monocytes Relative: 1 %
Neutro Abs: 26.2 10*3/uL — ABNORMAL HIGH (ref 1.7–7.7)
Neutrophils Relative %: 87 %
Platelets: 15 10*3/uL — CL (ref 150–400)
RBC: 2.74 MIL/uL — ABNORMAL LOW (ref 4.22–5.81)
RDW: 15.1 % (ref 11.5–15.5)
Smear Review: DECREASED
WBC Morphology: INCREASED
WBC: 30.3 10*3/uL — ABNORMAL HIGH (ref 4.0–10.5)
nRBC: 0 % (ref 0.0–0.2)

## 2021-06-05 LAB — RENAL FUNCTION PANEL
Albumin: 1.7 g/dL — ABNORMAL LOW (ref 3.5–5.0)
Albumin: 1.7 g/dL — ABNORMAL LOW (ref 3.5–5.0)
Anion gap: 7 (ref 5–15)
Anion gap: 11 (ref 5–15)
BUN: 17 mg/dL (ref 6–20)
BUN: 19 mg/dL (ref 6–20)
CO2: 22 mmol/L (ref 22–32)
CO2: 21 mmol/L — ABNORMAL LOW (ref 22–32)
Calcium: 7.2 mg/dL — ABNORMAL LOW (ref 8.9–10.3)
Calcium: 7.1 mg/dL — ABNORMAL LOW (ref 8.9–10.3)
Chloride: 104 mmol/L (ref 98–111)
Chloride: 101 mmol/L (ref 98–111)
Creatinine, Ser: 1.42 mg/dL — ABNORMAL HIGH (ref 0.61–1.24)
Creatinine, Ser: 1.41 mg/dL — ABNORMAL HIGH (ref 0.61–1.24)
GFR, Estimated: >60 mL/min (ref >60)
GFR, Estimated: >60 mL/min (ref >60)
Glucose, Bld: 182 mg/dL — ABNORMAL HIGH (ref 70–99)
Glucose, Bld: 203 mg/dL — ABNORMAL HIGH (ref 70–99)
Phosphorus: 1.7 mg/dL — ABNORMAL LOW (ref 2.5–4.6)
Phosphorus: 3.1 mg/dL (ref 2.5–4.6)
Potassium: 3.5 mmol/L (ref 3.5–5.1)
Potassium: 3.6 mmol/L (ref 3.5–5.1)
Sodium: 133 mmol/L — ABNORMAL LOW (ref 135–145)
Sodium: 133 mmol/L — ABNORMAL LOW (ref 135–145)

## 2021-06-05 LAB — CULTURE, BLOOD (ROUTINE X 2)
Culture: NO GROWTH
Culture: NO GROWTH

## 2021-06-05 LAB — VARICELLA ZOSTER ANTIBODY, IGG: Varicella IgG: <135 index — ABNORMAL LOW (ref >165)

## 2021-06-05 LAB — BPAM PLATELET PHERESIS
Blood Product Expiration Date: 202301232359
ISSUE DATE / TIME: 202301222028
Unit Type and Rh: 6200

## 2021-06-05 LAB — CMV IGM: CMV IgM: <30 AU/mL (ref 0.0–29.9)

## 2021-06-05 LAB — EPSTEIN-BARR VIRUS VCA, IGG: EBV VCA IgG: 282 U/mL — ABNORMAL HIGH (ref 0.0–17.9)

## 2021-06-05 LAB — ADAMTS13 ACTIVITY: Adamts 13 Activity: 47.1 % — ABNORMAL LOW (ref >66.8)

## 2021-06-05 LAB — PROCALCITONIN: Procalcitonin: 49.08 ng/mL

## 2021-06-05 LAB — EPSTEIN-BARR VIRUS VCA, IGM: EBV VCA IgM: <36 U/mL (ref 0.0–35.9)

## 2021-06-05 LAB — IMMATURE PLATELET FRACTION: Immature Platelet Fraction: 23.9 % — ABNORMAL HIGH (ref 1.2–8.6)

## 2021-06-05 LAB — ADAMTS13 ACTIVITY REFLEX

## 2021-06-05 LAB — LEGIONELLA PNEUMOPHILA SEROGP 1 UR AG: L. pneumophila Serogp 1 Ur Ag: NEGATIVE

## 2021-06-05 LAB — PATHOLOGIST SMEAR REVIEW

## 2021-06-05 LAB — MAGNESIUM: Magnesium: 2.2 mg/dL (ref 1.7–2.4)

## 2021-06-05 MED ORDER — AMIODARONE LOAD VIA INFUSION
150.0000 mg | Freq: Once | INTRAVENOUS | Status: AC
  Administered 2021-06-05: 150 mg via INTRAVENOUS
  Filled 2021-06-05: qty 83.34

## 2021-06-05 MED ORDER — SODIUM CHLORIDE 0.9 % IV SOLN
2.0000 g | Freq: Two times a day (BID) | INTRAVENOUS | Status: DC
  Administered 2021-06-05: 2 g via INTRAVENOUS
  Filled 2021-06-05 (×2): qty 2

## 2021-06-05 MED ORDER — AMIODARONE HCL IN DEXTROSE 360-4.14 MG/200ML-% IV SOLN
60.0000 mg/h | INTRAVENOUS | Status: DC
  Administered 2021-06-05 (×2): 60 mg/h via INTRAVENOUS
  Filled 2021-06-05 (×2): qty 200

## 2021-06-05 MED ORDER — INSULIN ASPART 100 UNIT/ML IJ SOLN
3.0000 [IU] | INTRAMUSCULAR | Status: DC
  Administered 2021-06-05 (×2): 6 [IU] via SUBCUTANEOUS
  Administered 2021-06-06 (×2): 3 [IU] via SUBCUTANEOUS
  Administered 2021-06-07: 6 [IU] via SUBCUTANEOUS
  Administered 2021-06-07: 01:00:00 9 [IU] via SUBCUTANEOUS
  Administered 2021-06-07: 04:00:00 6 [IU] via SUBCUTANEOUS
  Administered 2021-06-07: 21:00:00 3 [IU] via SUBCUTANEOUS
  Administered 2021-06-07 (×2): 6 [IU] via SUBCUTANEOUS
  Administered 2021-06-08 – 2021-06-09 (×4): 3 [IU] via SUBCUTANEOUS
  Administered 2021-06-09 (×3): 6 [IU] via SUBCUTANEOUS
  Administered 2021-06-09: 3 [IU] via SUBCUTANEOUS
  Administered 2021-06-09 – 2021-06-10 (×2): 9 [IU] via SUBCUTANEOUS
  Administered 2021-06-10: 6 [IU] via SUBCUTANEOUS
  Filled 2021-06-05 (×21): qty 1

## 2021-06-05 MED ORDER — AMIODARONE HCL IN DEXTROSE 360-4.14 MG/200ML-% IV SOLN
30.0000 mg/h | INTRAVENOUS | Status: DC
  Administered 2021-06-05 – 2021-06-10 (×10): 30 mg/h via INTRAVENOUS
  Filled 2021-06-05 (×10): qty 200

## 2021-06-05 MED ORDER — SODIUM PHOSPHATES 45 MMOLE/15ML IV SOLN
30.0000 mmol | Freq: Once | INTRAVENOUS | Status: AC
  Administered 2021-06-05: 30 mmol via INTRAVENOUS
  Filled 2021-06-05: qty 5

## 2021-06-05 MED ORDER — METOCLOPRAMIDE HCL 5 MG/ML IJ SOLN
5.0000 mg | Freq: Three times a day (TID) | INTRAMUSCULAR | Status: DC
  Administered 2021-06-05 – 2021-06-11 (×19): 5 mg via INTRAVENOUS
  Filled 2021-06-05 (×19): qty 2

## 2021-06-05 MED ORDER — SODIUM CHLORIDE 0.9 % IV SOLN
3.0000 g | Freq: Three times a day (TID) | INTRAVENOUS | Status: DC
  Administered 2021-06-05 – 2021-06-07 (×5): 3 g via INTRAVENOUS
  Filled 2021-06-05: qty 3
  Filled 2021-06-05: qty 8
  Filled 2021-06-05 (×3): qty 3
  Filled 2021-06-05: qty 8
  Filled 2021-06-05: qty 3

## 2021-06-05 NOTE — Consult Note (Signed)
[facility] for Electrolyte Monitoring and Replacement   Recent Labs: Potassium (mmol/L)  Date Value  06/05/2021 3.5  08/05/2013 3.8   Magnesium (mg/dL)  Date Value  06/05/2021 2.2   Calcium (mg/dL)  Date Value  06/05/2021 7.2 (L)   Calcium, Total (PTH) (mg/dL)  Date Value  06/12/2020 8.0   Albumin (g/dL)  Date Value  06/05/2021 1.7 (L)  08/05/2013 2.9 (L)   Phosphorus (mg/dL)  Date Value  06/05/2021 1.7 (L)   Sodium (mmol/L)  Date Value  06/05/2021 133 (L)  08/05/2013 133 (L)   Assessment: Jonathan Mcmillan is a 26 y.o. male with medical history including diabetes, CKD, HTN, nicotine dependence admitted on 05/31/2021 with  AKI on CKD, anemia, and fevers/chills/myalgias . Patient suffered cardiac arrest on the morning of 1/19 and was ultimately intubated and transferred to the ICU. Patient is currently on CRRT per nephrology. Pharmacy has been consulted to monitor and replace electrolytes.  K: 3.4>3.5 Phos: 1.6>1.7 Na: 134>133   Goal of Therapy:  Electrolytes within normal limits  Plan:  Remains on CRRT (Prismasol 4K/2.5Ca) Potassium correction with CRRT fluids (improved WNL) Repeat dose of Sodium Phos 30 mmol IV x1 (low/uptrending) Continue to monitor renal function panels BID while on CRRT  Lorna Dibble, PharmD, [facility] Clinical Pharmacist 06/05/2021 7:42 AM

## 2021-06-05 NOTE — Progress Notes (Signed)
*  PRELIMINARY RESULTS* Echocardiogram 2D Echocardiogram has been performed.  Jonathan Mcmillan 06/05/2021, 1:49 PM

## 2021-06-05 NOTE — Progress Notes (Addendum)
Hematology/Oncology Progress Note  [phone] [phone]  Patient Care Team: [facility] as PCP - General (General Practice)   Name of the patient: Jonathan Mcmillan  [id number]  [birth year]  Date of visit: 06/05/21   INTERVAL HISTORY-  Remains unresponsive, intubated.    Review of systems- Review of Systems  Unable to perform ROS: Intubated   No Known Allergies  Patient Active Problem List   Diagnosis Date Noted   Pressure injury of skin 06/01/2021   Hypotension    Fever    Cardiopulmonary arrest ([place])    Type 2 diabetes mellitus with hypoglycemia without coma, without long-term current use of insulin ([place]) 05/31/2021   Nicotine dependence 05/31/2021   Anemia of chronic disease 05/31/2021   Acute kidney injury superimposed on CKD ([place]) 06/11/2020   Nausea    Polysubstance abuse ([place])    AKI (acute kidney injury) ([place]) 06/10/2020   Abdominal pain 09/09/2018   Right arm cellulitis 01/26/2018   Sepsis ([place]) 03/06/2017   Malnutrition of moderate degree 05/22/2016   DKA (diabetic ketoacidoses) 05/21/2016   DM (diabetes mellitus), 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[place]) 45/07/8880   Hypertension 10/05/2010   Obesity 10/05/2010     Past Medical History:  Diagnosis Date   Cannabinoid hyperemesis syndrome    Diabetes 1.5, managed as type 1 ([place])    Gastroparesis    Perirectal abscess 06/16/2017     Past Surgical History:  Procedure Laterality Date   INCISION AND DRAINAGE PERIRECTAL ABSCESS N/A 06/16/2017   Procedure: IRRIGATION AND DEBRIDEMENT PERIRECTAL ABSCESS;  Surgeon: Florene Glen, MD;  Location: [facility] ORS;  Service: General;  Laterality: N/A;   none      Social History   Socioeconomic History   Marital status: Single    Spouse name: Not on file   Number of children: Not on file   Years of education: Not on file   Highest education level: Not on file  Occupational History   Occupation: unemployed  Tobacco Use    Smoking status: Every Day    Packs/day: 0.50    Types: Cigarettes   Smokeless tobacco: Never  Vaping Use   Vaping Use: Never used  Substance and Sexual Activity   Alcohol use: No   Drug use: Yes    Types: Marijuana   Sexual activity: Not on file  Other Topics Concern   Not on file  Social History Narrative   Not on file   Social Determinants of Health   Financial Resource Strain: Not on file  Food Insecurity: Not on file  Transportation Needs: Not on file  Physical Activity: Not on file  Stress: Not on file  Social Connections: Not on file  Intimate Partner Violence: Not on file     Family History  Problem Relation Age of Onset   Diabetes Mother      Current Facility-Administered Medications:     prismasol BGK 4/2.5 infusion, , CRRT, Continuous, Candiss Norse, Harmeet, MD, Last Rate: 300 mL/hr at 06/05/21 1209, New Bag at 06/05/21 1209    prismasol BGK 4/2.5 infusion, , CRRT, Continuous, Singh, Harmeet, MD, Last Rate: 300 mL/hr at 06/05/21 1208, New Bag at 06/05/21 1208   0.9 %  sodium chloride infusion (Manually program via Guardrails IV Fluids), , Intravenous, Once, Lang Snow, NP, Held at 06/04/21 0247   albuterol (PROVENTIL) (2.5 MG/3ML) 0.083% nebulizer solution 2.5 mg, 2.5 mg, Nebulization, Q6H, Schertz, Michele Mcalpine, MD, 2.5 mg at 06/05/21 0748   [COMPLETED] amiodarone (  NEXTERONE) 1.8 mg/mL load via infusion 150 mg, 150 mg, Intravenous, Once, 150 mg at 06/05/21 1100 **FOLLOWED BY** amiodarone (NEXTERONE PREMIX) 360-4.14 MG/200ML-% (1.8 mg/mL) IV infusion, 60 mg/hr, Intravenous, Continuous, Last Rate: 33.3 mL/hr at 06/05/21 1600, 60 mg/hr at 06/05/21 1600 **FOLLOWED BY** amiodarone (NEXTERONE PREMIX) 360-4.14 MG/200ML-% (1.8 mg/mL) IV infusion, 30 mg/hr, Intravenous, Continuous, Scoggins, Amber, NP   Ampicillin-Sulbactam (UNASYN) 3 g in sodium chloride 0.9 % 100 mL IVPB, 3 g, Intravenous, Q8H, Kasa, Kurian, MD   chlorhexidine gluconate (MEDLINE KIT) (PERIDEX) 0.12 %  solution 15 mL, 15 mL, Mouth Rinse, BID, Jonnie Finner, Michele Mcalpine, MD, 15 mL at 06/05/21 7915   Chlorhexidine Gluconate Cloth 2 % PADS 6 each, 6 each, Topical, Daily, Wieting, Richard, MD, 6 each at 06/01/21 1231   docusate (COLACE) 50 MG/5ML liquid 100 mg, 100 mg, Per Tube, BID, Darel Hong D, NP   fentaNYL (SUBLIMAZE) bolus via infusion 50-100 mcg, 50-100 mcg, Intravenous, Q1H PRN, Ouma, Bing Neighbors, NP   fentaNYL 25104mg in NS 2569m(10106mml) infusion-PREMIX, 50-200 mcg/hr, Intravenous, Continuous, Ouma, EliBing NeighborsP, Last Rate: 10 mL/hr at 06/05/21 1600, 100 mcg/hr at 06/05/21 1600   guaiFENesin-dextromethorphan (ROBITUSSIN DM) 100-10 MG/5ML syrup 10 mL, 10 mL, Oral, Q6H PRN, Agbata, Tochukwu, MD, 10 mL at 05/31/21 1652   heparin injection 1,000-6,000 Units, 1,000-6,000 Units, CRRT, PRN, Kolluru, Sarath, MD, 1,800 Units at 06/05/21 1013   hydrocortisone sodium succinate (SOLU-CORTEF) 100 MG injection 50 mg, 50 mg, Intravenous, Q6H, SchBennie PieriniD, 50 mg at 06/05/21 0908   ipratropium-albuterol (DUONEB) 0.5-2.5 (3) MG/3ML nebulizer solution 3 mL, 3 mL, Nebulization, Q6H PRN, Ouma, EliBing NeighborsP   lidocaine (LIDODERM) 5 % 1 patch, 1 patch, Transdermal, Q24H, Ouma, EliBing NeighborsP, 1 patch at 06/03/21 2143   lip balm (BLISTEX) ointment, , Topical, PRN, OumLang SnowP   MEDLINE mouth rinse, 15 mL, Mouth Rinse, 10 times per day, SchBennie PieriniD, 15 mL at 06/05/21 1621   metoCLOPramide (REGLAN) injection 5 mg, 5 mg, Intravenous, Q8H, Kasa, Kurian, MD, 5 mg at 06/05/21 1333   multivitamin (RENA-VIT) tablet 1 tablet, 1 tablet, Per Tube, QHS, BreNelle DonD, 1 tablet at 06/02/21 2139   norepinephrine (LEVOPHED) 16 mg in 250m34memix infusion, 0-40 mcg/min, Intravenous, Titrated, ScheBennie Pierini, Last Rate: 15.9 mL/hr at 06/05/21 0700, 16.96 mcg/min at 06/05/21 0700   nutrition supplement (JUVEN) (JUVEN) powder packet 1 packet, 1 packet,  Per Tube, BID BM, BresNelle Don, 1 packet at 06/04/21 0950   ondansetron (ZOFRAN) tablet 4 mg, 4 mg, Oral, Q6H PRN **OR** ondansetron (ZOFRAN) injection 4 mg, 4 mg, Intravenous, Q6H PRN, Agbata, Tochukwu, MD   oxyCODONE (Oxy IR/ROXICODONE) immediate release tablet 5 mg, 5 mg, Oral, Q4H PRN, ScheJonnie FinneramMichele Mcalpine   pantoprazole (PROTONIX) EC tablet 40 mg, 40 mg, Oral, Daily, ScheBennie Pierini, 40 mg at 06/04/21 0950   polyethylene glycol (MIRALAX / GLYCOLAX) packet 17 g, 17 g, Per Tube, Daily, KeenDarel HongNP   polyvinyl alcohol (LIQUIFILM TEARS) 1.4 % ophthalmic solution, , Both Eyes, QHS PRN, Ouma, ElizBing Neighbors   prismasol BGK 2/2.5 dialysis solution, , CRRT, Continuous, Kolluru, SaraLurena Nida, Stopped at 06/03/21 1756   pris[place].5 replacement solution, , CRRT, Continuous, Kolluru, Sarath, MD, Stopped at 06/03/21 1756   prismasol BGK 2/2.5 replacement solution, , CRRT, Continuous, Kolluru, Sarath, MD, Stopped at 06/03/21 1756   prismasol BGK 4/2.5 infusion, , CRRT, Continuous, SingMurlean Iba, Last  Rate: 1,500 mL/hr at 06/05/21 1528, New Bag at 06/05/21 1528   propofol (DIPRIVAN) 1000 MG/100ML infusion, 0-50 mcg/kg/min, Intravenous, Continuous, Ouma, Bing Neighbors, NP, Last Rate: 6.54 mL/hr at 06/05/21 1600, 15 mcg/kg/min at 06/05/21 1600   sodium chloride 0.9 % primer fluid for CRRT, , CRRT, PRN, Kolluru, Sarath, MD   sodium phosphate 30 mmol in dextrose 5 % 250 mL infusion, 30 mmol, Intravenous, Once, Averill, Pons, RPH, Last Rate: 43 mL/hr at 06/05/21 1600, Infusion Verify at 06/05/21 1600   vasopressin (PITRESSIN) 20 Units in sodium chloride 0.9 % 100 mL infusion-*FOR SHOCK*, 0-0.03 Units/min, Intravenous, Continuous, Bennie Pierini, MD, Last Rate: 9 mL/hr at 06/05/21 1600, 0.03 Units/min at 06/05/21 1600   Physical exam:  Vitals:   06/05/21 1331 06/05/21 1400 06/05/21 1500 06/05/21 1622  BP: (!) 122/92 116/62 128/73   Pulse:  (!) 53    Resp:       Temp: 98.1 F (36.7 C) 98.2 F (36.8 C) 98.2 F (36.8 C)   TempSrc:      SpO2:  100%  100%  Weight:      Height:       Physical Exam     CMP Latest Ref Rng & Units 06/05/2021  Glucose 70 - 99 mg/dL 182(H)  BUN 6 - 20 mg/dL 17  Creatinine 0.61 - 1.24 mg/dL 1.42(H)  Sodium 135 - 145 mmol/L 133(L)  Potassium 3.5 - 5.1 mmol/L 3.5  Chloride 98 - 111 mmol/L 104  CO2 22 - 32 mmol/L 22  Calcium 8.9 - 10.3 mg/dL 7.2(L)  Total Protein 6.5 - 8.1 g/dL -  Total Bilirubin 0.3 - 1.2 mg/dL -  Alkaline Phos 38 - 126 U/L -  AST 15 - 41 U/L -  ALT 0 - 44 U/L -   CBC Latest Ref Rng & Units 06/05/2021  WBC 4.0 - 10.5 K/uL 30.3(H)  Hemoglobin 13.0 - 17.0 g/dL 8.3(L)  Hematocrit 39.0 - 52.0 % 23.8(L)  Platelets 150 - 400 K/uL 15(LL)    RADIOGRAPHIC STUDIES: I have personally reviewed the radiological images as listed and agreed with the findings in the report. DG Chest 1 View  Result Date: 06/03/2021 CLINICAL DATA:  Post intubation EXAM: CHEST  1 VIEW COMPARISON:  06/02/2021, 05/31/2021, 03/08/2020 FINDINGS: Tip of the endotracheal tube is about 3.3 cm superior to carina. Cardiomegaly with bilateral effusions and worsened basilar airspace disease. Vascular congestion. Esophageal tube has been removed. IMPRESSION: 1. Endotracheal tube tip about 3.3 cm superior to carina. 2. Cardiomegaly with interim development of bilateral effusions and basilar airspace disease. Vascular congestion and mild edema. Overall aeration is worse as compared with 06/02/2021. Electronically Signed   By: Donavan Foil M.D.   On: 06/03/2021 23:24   DG Chest 2 View  Result Date: 05/31/2021 CLINICAL DATA:  Cough.  Fever. EXAM: CHEST - 2 VIEW COMPARISON:  Multiple chest XRs, most recently 03/08/2020. CT chest, 01/02/2020. FINDINGS: Cardiomediastinal silhouette is within normal limits given positioning and technique. Lungs are well inflated. No focal consolidation or mass. No pleural effusion or pneumothorax. No acute  displaced fracture. IMPRESSION: No active cardiopulmonary disease. Electronically Signed   By: Michaelle Birks M.D.   On: 05/31/2021 07:34   DG Abd 1 View  Result Date: 06/04/2021 CLINICAL DATA:  OG tube. EXAM: ABDOMEN - 1 VIEW COMPARISON:  CT CAP, 06/03/2021.  KUB, 06/03/2021 and 06/01/2021. FINDINGS: Support lines: Enteric tube with tip and side port within stomach. RIGHT femoral dialysis catheter, incompletely imaged. Overlying pacer leads.  The bowel gas pattern is normal.  No interval osseous abnormality. IMPRESSION: 1. Well-positioned enteric feeding tube, with tip and side port within stomach. Additional lines and tubes as above. 2. Nonobstructed bowel-gas. Electronically Signed   By: Michaelle Birks M.D.   On: 06/04/2021 08:39   DG Abd 1 View  Result Date: 06/03/2021 CLINICAL DATA:  Distension EXAM: ABDOMEN - 1 VIEW COMPARISON:  06/01/2021 FINDINGS: Nonobstructed gas pattern with paucity of bowel gas. Bilateral femoral catheters, right catheter tip over lies the right aspect of the L2 vertebral body. Left catheter tip overlies the upper aspect of left sacrum. IMPRESSION: Nonobstructed gas pattern with overall paucity of bowel gas. Esophageal tube has been removed. Electronically Signed   By: Donavan Foil M.D.   On: 06/03/2021 23:25   DG Abd 1 View  Result Date: 06/01/2021 CLINICAL DATA:  Encounter for feeding tube placement EXAM: ABDOMEN - 1 VIEW COMPARISON:  X-ray abdomen 06/11/2020. FINDINGS: Enteric tube coursing below the hemidiaphragm with tip and side port overlying the gastric lumen. Tip likely in the region of the pylorus/first portion of the duodenum. Bilateral femoral approach catheters noted with left catheter overlying the L5 vertebral body and right catheter with tip overlying the right aspect of the L1 vertebral body. Nonobstructive bowel gas pattern. Slight distension of the small bowel with gas. No radio-opaque calculi or other significant radiographic abnormality are seen. IMPRESSION:  Enteric tube coursing below within the gastric lumen with tip overlying the expected region of the pylorus. Consider retracting by 3 cm. Electronically Signed   By: Iven Finn M.D.   On: 06/01/2021 18:11   CT HEAD WO CONTRAST (5MM)  Result Date: 06/04/2021 CLINICAL DATA:  Initial evaluation for neuro deficit, stroke suspected. EXAM: CT HEAD WITHOUT CONTRAST TECHNIQUE: Contiguous axial images were obtained from the base of the skull through the vertex without intravenous contrast. RADIATION DOSE REDUCTION: This exam was performed according to the departmental dose-optimization program which includes automated exposure control, adjustment of the mA and/or kV according to patient size and/or use of iterative reconstruction technique. COMPARISON:  Prior CT from 06/01/2021. FINDINGS: Brain: Cerebral volume stable, and remains within normal limits. No acute intracranial hemorrhage. No acute large vessel territory infarct. No mass lesion or midline shift. No hydrocephalus or extra-axial fluid collection. Vascular: No hyperdense vessel. Skull: Scalp soft tissues demonstrate no new abnormality. Suspected anasarca. Calvarium intact. Sinuses/Orbits: Globes and orbital soft tissues demonstrate no acute finding. Scattered mucosal thickening noted about the sphenoid ethmoidal and maxillary sinuses. Few scattered superimposed small air-fluid levels noted. Trace bilateral mastoid effusions. Other: None. IMPRESSION: 1. Stable head CT with no acute intracranial abnormality. 2. Scattered paranasal sinus disease with superimposed small air-fluid levels, suggesting acute sinusitis. Associated trace mastoid effusions. Appearance is similar to previous. Electronically Signed   By: Jeannine Boga M.D.   On: 06/04/2021 01:14   CT HEAD WO CONTRAST (5MM)  Result Date: 06/01/2021 CLINICAL DATA:  Altered mental status EXAM: CT HEAD WITHOUT CONTRAST TECHNIQUE: Contiguous axial images were obtained from the base of the skull  through the vertex without intravenous contrast. RADIATION DOSE REDUCTION: This exam was performed according to the departmental dose-optimization program which includes automated exposure control, adjustment of the mA and/or kV according to patient size and/or use of iterative reconstruction technique. COMPARISON:  05/21/2016 FINDINGS: Brain: No acute intracranial findings are seen. There are no signs of bleeding within the cranium. Ventricles are not dilated. There is no shift of midline structures. There are no epidural or subdural  fluid collections. Vascular: Unremarkable. Skull: Unremarkable. Sinuses/Orbits: Air-fluid level is seen in the sphenoid sinus. There is mucosal thickening in the ethmoid and maxillary sinuses. Small air-fluid levels are seen in both maxillary sinuses. Other: None IMPRESSION: No acute intracranial findings are seen in noncontrast CT brain. Chronic sinusitis. Air-fluid levels are seen in the sphenoid and maxillary sinuses suggesting possible acute sinusitis. Electronically Signed   By: Elmer Picker M.D.   On: 06/01/2021 17:03   CT CHEST WO CONTRAST  Result Date: 05/31/2021 CLINICAL DATA:  Body aches, chills, nausea since Sunday. EXAM: CT CHEST WITHOUT CONTRAST TECHNIQUE: Multidetector CT imaging of the chest was performed following the standard protocol without IV contrast. RADIATION DOSE REDUCTION: This exam was performed according to the departmental dose-optimization program which includes automated exposure control, adjustment of the mA and/or kV according to patient size and/or use of iterative reconstruction technique. COMPARISON:  Same day chest radiograph, CT chest 01/02/2020 FINDINGS: Cardiovascular: The heart is not enlarged. There is trace pericardial fluid. The blood pool is hypodense suggesting anemia. The vasculature is unremarkable, as can be assessed in the absence of intravenous contrast. Mediastinum/Nodes: The thyroid is unremarkable. The esophagus is grossly  unremarkable. There is no mediastinal or axillary lymphadenopathy. There is no bulky hilar adenopathy, suboptimally evaluated in the absence of intravenous contrast. Lungs/Pleura: The trachea and central airways are patent. The lungs are well inflated. There is no focal consolidation or pulmonary edema. There is no pleural effusion or pneumothorax. Upper Abdomen: The imaged portions of the upper abdominal viscera are unremarkable. Musculoskeletal: Multiple remote right-sided rib fractures are seen. There is no acute osseous abnormality or aggressive osseous lesion. Other: There is mild diffuse body wall edema. IMPRESSION: 1. Trace pericardial effusion and mild diffuse body wall edema, new since 01/02/2020. 2. Clear lungs, with no focal consolidation or pleural effusion. 3. Hypodense blood pool consistent with anemia. Electronically Signed   By: Valetta Mole M.D.   On: 05/31/2021 12:08   US RENAL  Result Date: 05/31/2021 CLINICAL DATA:  Acute kidney injury EXAM: RENAL / URINARY TRACT ULTRASOUND COMPLETE COMPARISON:  06/10/2020 FINDINGS: Right Kidney: Renal measurements: 10.9 x 6.1 x 7.0 cm = volume: 244 mL. Increased cortical echogenicity with medical renal disease. Trace retroperitoneal perinephric edema. No hydronephrosis focal abnormality. Left Kidney: Renal measurements: 11.1 x 6.9 x 6.5 cm = volume: 257 mL. Similar increased cortical echogenicity compatible with medical renal disease. Similar trace retroperitoneal perinephric fluid/edema. No hydronephrosis. No focal abnormality. Bladder: Nearly collapsed, accounting for wall prominence Other: None. IMPRESSION: Increased renal echogenicity compatible with medical renal disease. Retroperitoneal perinephric edema suspect related to volume overload. Electronically Signed   By: Jerilynn Mages.  Shick M.D.   On: 05/31/2021 14:01   US Venous Img Lower Bilateral (DVT)  Result Date: 06/04/2021 CLINICAL DATA:  Initial evaluation for bilateral lower extremity swelling. EXAM:  BILATERAL LOWER EXTREMITY VENOUS DOPPLER ULTRASOUND TECHNIQUE: Gray-scale sonography with graded compression, as well as color Doppler and duplex ultrasound were performed to evaluate the lower extremity deep venous systems from the level of the common femoral vein and including the common femoral, femoral, profunda femoral, popliteal and calf veins including the posterior tibial, peroneal and gastrocnemius veins when visible. The superficial great saphenous vein was also interrogated. Spectral Doppler was utilized to evaluate flow at rest and with distal augmentation maneuvers in the common femoral, femoral and popliteal veins. COMPARISON:  None. FINDINGS: RIGHT LOWER EXTREMITY Common Femoral Vein: No evidence of thrombus. Normal compressibility, respiratory phasicity and response to augmentation. Saphenofemoral  Junction: No evidence of thrombus. Normal compressibility and flow on color Doppler imaging. Profunda Femoral Vein: No evidence of thrombus. Normal compressibility and flow on color Doppler imaging. Femoral Vein: No evidence of thrombus. Normal compressibility, respiratory phasicity and response to augmentation. Popliteal Vein: No evidence of thrombus. Normal compressibility, respiratory phasicity and response to augmentation. Calf Veins: No evidence of thrombus. Normal compressibility and flow on color Doppler imaging. Superficial Great Saphenous Vein: No evidence of thrombus. Normal compressibility. Venous Reflux:  None. Other Findings:  None. LEFT LOWER EXTREMITY Common Femoral Vein: Not seen, obscured by overlying bandaging material. Saphenofemoral Junction: Not seen, obscured by overlying bandaging material. Profunda Femoral Vein: Not seen, obscured by overlying bandaging material. Femoral Vein: No evidence of thrombus. Normal compressibility, respiratory phasicity and response to augmentation. Popliteal Vein: No evidence of thrombus. Normal compressibility, respiratory phasicity and response to  augmentation. Calf Veins: No evidence of thrombus. Normal compressibility and flow on color Doppler imaging. Superficial Great Saphenous Vein: No evidence of thrombus. Normal compressibility. Venous Reflux:  None. Other Findings:  None. IMPRESSION: 1. Nonvisualization of the left common femoral vein, saphenofemoral junction, and profunda femoral vein, obscured by overlying bandaging material. 2. Otherwise no evidence of deep venous thrombosis in either lower extremity. Electronically Signed   By: Jeannine Boga M.D.   On: 06/04/2021 05:30   DG Chest Port 1 View  Result Date: 06/02/2021 CLINICAL DATA:  Acute respiratory failure, hypoxia EXAM: PORTABLE CHEST 1 VIEW COMPARISON:  Chest radiograph 1 day prior FINDINGS: The endotracheal tube is approximately 2.6 cm from the carina. The enteric catheter tip is off the field of view. A presumed esophageal temperature probe terminates in the upper thorax. The cardiac silhouette is enlarged, unchanged. The mediastinal contours are stable. There are patchy opacities in the lung bases, particularly on the right, likely not significantly changed in the interim. There is no significant pleural effusion. There is no pneumothorax. There is no acute osseous abnormality. IMPRESSION: 1. Unchanged enlargement of the cardiac silhouette. 2. Patchy bibasilar opacities are not significantly changed and may reflect atelectasis or developing infection. 3. Presumed esophageal temperature probe terminating in the upper thorax. Endotracheal tube in satisfactory position. Enteric catheter tip is off the field of view. Electronically Signed   By: Valetta Mole M.D.   On: 06/02/2021 07:25   DG Chest Port 1 View  Result Date: 06/01/2021 CLINICAL DATA:  Intubation. EXAM: PORTABLE CHEST 1 VIEW COMPARISON:  Chest radiographs and CT 05/31/2021 FINDINGS: An endotracheal tube has been placed and terminates proximally 2.5 cm above the carina. Telemetry leads overlie the chest. The cardiac  silhouette is accentuated by portable AP technique although true mild interval enlargement, such as from a pericardial effusion, is not excluded. Lung volumes are lower than on the prior radiographs, and there are mild opacities in both lung bases. No sizable pleural effusion or pneumothorax is identified. IMPRESSION: 1. Endotracheal tube in appropriate position. 2. Low lung volumes with mild bibasilar opacities which may reflect atelectasis or infection. Electronically Signed   By: Logan Bores M.D.   On: 06/01/2021 13:56   ECHOCARDIOGRAM COMPLETE  Result Date: 06/01/2021    ECHOCARDIOGRAM REPORT   Patient Name:   FRANKEY BOTTING Date of Exam: 06/01/2021 Medical Rec #:  [id number]       Height:       68.0 in Accession #:    [id number]      Weight:       145.0 lb Date of Birth:  December 07, 1995  BSA:          1.783 m Patient Age:    25 years        BP:           155/83 mmHg Patient Gender: M               HR:           95 bpm. Exam Location:  [facility] Procedure: 2D Echo, Color Doppler, Cardiac Doppler and Strain Analysis Indications:     I46.9 Cardiac arrest  History:         Patient has no prior history of Echocardiogram examinations.                  ESRD, stage IV, Signs/Symptoms:Fever; Risk Factors:Diabetes.  Sonographer:     Charmayne Sheer Referring Phys:  [id number] Bradly Bienenstock Diagnosing Phys: Ida Rogue MD  Sonographer Comments: Echo performed with patient supine and on artificial respirator. Global longitudinal strain was attempted. IMPRESSIONS  1. Left ventricular ejection fraction, by estimation, is 35 to 40%. The left ventricle has moderately decreased function. The left ventricle demonstrates global hypokinesis. There is moderate left ventricular hypertrophy. Left ventricular diastolic parameters are consistent with Grade II diastolic dysfunction (pseudonormalization). The average left ventricular global longitudinal strain is -11.3 %. The global longitudinal strain is abnormal.  2. Right ventricular  systolic function is mildly reduced. The right ventricular size is normal. There is moderately elevated pulmonary artery systolic pressure. The estimated right ventricular systolic pressure is 36.6 mmHg.  3. Left atrial size was mildly dilated.  4. The mitral valve is normal in structure. Mild mitral valve regurgitation. No evidence of mitral stenosis.  5. The aortic valve is normal in structure. Aortic valve regurgitation is mild. No aortic stenosis is present.  6. The inferior vena cava is dilated in size with <50% respiratory variability, suggesting right atrial pressure of 15 mmHg. FINDINGS  Left Ventricle: Left ventricular ejection fraction, by estimation, is 35 to 40%. The left ventricle has moderately decreased function. The left ventricle demonstrates global hypokinesis. The average left ventricular global longitudinal strain is -11.3 %. The global longitudinal strain is abnormal. The left ventricular internal cavity size was normal in size. There is moderate left ventricular hypertrophy. Left ventricular diastolic parameters are consistent with Grade II diastolic dysfunction (pseudonormalization). Right Ventricle: The right ventricular size is normal. No increase in right ventricular wall thickness. Right ventricular systolic function is mildly reduced. There is moderately elevated pulmonary artery systolic pressure. The tricuspid regurgitant velocity is 2.80 m/s, and with an assumed right atrial pressure of 15 mmHg, the estimated right ventricular systolic pressure is 29.4 mmHg. Left Atrium: Left atrial size was mildly dilated. Right Atrium: Right atrial size was normal in size. Pericardium: There is no evidence of pericardial effusion. Mitral Valve: The mitral valve is normal in structure. Mild mitral valve regurgitation. No evidence of mitral valve stenosis. MV peak gradient, 5.4 mmHg. The mean mitral valve gradient is 3.0 mmHg. Tricuspid Valve: The tricuspid valve is normal in structure. Tricuspid valve  regurgitation is mild . No evidence of tricuspid stenosis. Aortic Valve: The aortic valve is normal in structure. Aortic valve regurgitation is mild. No aortic stenosis is present. Aortic valve mean gradient measures 6.0 mmHg. Aortic valve peak gradient measures 10.1 mmHg. Aortic valve area, by VTI measures 2.64  cm. Pulmonic Valve: The pulmonic valve was normal in structure. Pulmonic valve regurgitation is trivial. No evidence of pulmonic stenosis. Aorta: The aortic root is normal  in size and structure. Venous: The inferior vena cava is dilated in size with less than 50% respiratory variability, suggesting right atrial pressure of 15 mmHg. IAS/Shunts: No atrial level shunt detected by color flow Doppler.  LEFT VENTRICLE PLAX 2D LVIDd:         4.90 cm   Diastology LVIDs:         3.79 cm   LV e' medial:    8.59 cm/s LV PW:         1.43 cm   LV E/e' medial:  14.4 LV IVS:        0.93 cm   LV e' lateral:   9.46 cm/s LVOT diam:     2.10 cm   LV E/e' lateral: 13.1 LV SV:         58 LV SV Index:   33        2D Longitudinal Strain LVOT Area:     3.46 cm  2D Strain GLS Avg:     -11.3 %  RIGHT VENTRICLE RV Basal diam:  3.95 cm RV S prime:     11.40 cm/s LEFT ATRIUM             Index        RIGHT ATRIUM           Index LA diam:        4.40 cm 2.47 cm/m   RA Area:     20.10 cm LA Vol (A2C):   77.6 ml 43.53 ml/m  RA Volume:   60.00 ml  33.66 ml/m LA Vol (A4C):   73.9 ml 41.45 ml/m LA Biplane Vol: 79.4 ml 44.54 ml/m  AORTIC VALVE                     PULMONIC VALVE AV Area (Vmax):    2.40 cm      PV Vmax:       1.12 m/s AV Area (Vmean):   2.23 cm      PV Vmean:      82.500 cm/s AV Area (VTI):     2.64 cm      PV VTI:        0.187 m AV Vmax:           159.00 cm/s   PV Peak grad:  5.0 mmHg AV Vmean:          113.000 cm/s  PV Mean grad:  3.0 mmHg AV VTI:            0.220 m AV Peak Grad:      10.1 mmHg AV Mean Grad:      6.0 mmHg LVOT Vmax:         110.00 cm/s LVOT Vmean:        72.600 cm/s LVOT VTI:          0.168 m  LVOT/AV VTI ratio: 0.76  AORTA Ao Root diam: 2.80 cm MITRAL VALVE                TRICUSPID VALVE MV Area (PHT): 5.16 cm     TR Peak grad:   31.4 mmHg MV Area VTI:   2.47 cm     TR Vmax:        280.00 cm/s MV Peak grad:  5.4 mmHg MV Mean grad:  3.0 mmHg     SHUNTS MV Vmax:       1.16 m/s     Systemic VTI:  0.17 m MV Vmean:  77.4 cm/s    Systemic Diam: 2.10 cm MV Decel Time: 147 msec MV E velocity: 124.00 cm/s MV A velocity: 54.40 cm/s MV E/A ratio:  2.28 Ida Rogue MD Electronically signed by Ida Rogue MD Signature Date/Time: 06/01/2021/6:41:43 PM    Final    CT CHEST ABDOMEN PELVIS WO CONTRAST  Result Date: 06/04/2021 CLINICAL DATA:  Sepsis. EXAM: CT CHEST, ABDOMEN AND PELVIS WITHOUT CONTRAST TECHNIQUE: Multidetector CT imaging of the chest, abdomen and pelvis was performed following the standard protocol without IV contrast. RADIATION DOSE REDUCTION: This exam was performed according to the departmental dose-optimization program which includes automated exposure control, adjustment of the mA and/or kV according to patient size and/or use of iterative reconstruction technique. COMPARISON:  Chest CT dated 05/31/2021. FINDINGS: Evaluation of this exam is limited in the absence of intravenous contrast. Evaluation is also limited due to streak artifact caused by patient's arms as well as anasarca. CT CHEST FINDINGS Cardiovascular: Top-normal cardiac size. No pericardial effusion. There is hypoattenuation of the cardiac blood pool suggestive of anemia. Clinical correlation is recommended. Mild atherosclerotic calcification of the aortic arch. The central pulmonary arteries are grossly unremarkable. Mediastinum/Nodes: Evaluation of the hilar and mediastinal structure is very limited due to factors above. No mediastinal fluid collection. Lungs/Pleura: Small bilateral pleural effusions. There are large areas of consolidative change involving the lower lobes, right greater left. There is near complete  consolidation of the right lower lobe. There is impaction of the right lower lobe bronchus as well as impaction of the left lower lobe bronchi which may represent aspiration. No pneumothorax. Tracheostomy with tip approximately 3 cm above the carina. Musculoskeletal: Diffuse chest wall edema and anasarca. No acute osseous pathology. Old healed right posterior rib fracture. CT ABDOMEN PELVIS FINDINGS No intra-abdominal free air.  Small ascites. Hepatobiliary: The liver is unremarkable. Mild periportal edema. Probable gallbladder sludge. No calcified gallstone. Pancreas: Unremarkable. No pancreatic ductal dilatation or surrounding inflammatory changes. Spleen: Normal in size without focal abnormality. Adrenals/Urinary Tract: The adrenal glands are unremarkable. There is no hydronephrosis or nephrolithiasis on either side. The urinary bladder is decompressed around a Foley catheter. Stomach/Bowel: A rectal tube is in place. No bowel obstruction. The appendix is unremarkable as visualized. Vascular/Lymphatic: The abdominal aorta and IVC are grossly unremarkable. Right femoral approach venous line with tip in the IVC inferior to the renal vein. Left femoral approach venous line with tip in the left common iliac vein. Right femoral arterial line with tip in the right external iliac artery. No portal venous gas. No adenopathy. Reproductive: The prostate and seminal vesicles are grossly unremarkable. Other: Diffuse subcutaneous edema and anasarca. Musculoskeletal: No acute or significant osseous findings. IMPRESSION: 1. Impacted bilateral lower lobe bronchi with large areas of consolidation involving the lower lobes with near complete consolidation the right lower lobe, new since the prior CT. Findings likely represent postobstructive atelectasis or pneumonia. Aspiration is not excluded. Small bilateral pleural effusions also new since the prior CT. 2. Small ascites and anasarca. 3. Aortic Atherosclerosis (ICD10-I70.0).  Electronically Signed   By: Anner Crete M.D.   On: 06/04/2021 00:07    Assessment and plan-   # severe thrombocytopenia Platelet went up to 29,000 after transfusion, decrease to 15,000 today.  No active bleeding. HIT and ADAMTS 13, Results are pending. Thrombocytopenia is likely multifactorial, in the setting of sepsis.  Due to the increased immature platelet fraction, there is a component of peripheral destruction due to drug or autoimmune condition. Previously on vancomycin and Zosyn  and both have been discontinued. Continue monitor CBC daily.  Transfuse platelet if count drops below 10,000 or active bleeding.  Continue monitor CBC closely. Recommend sterid. - he has been on stress dose of steroid already.   he has normal vitamin B12, folate, normal LDH, no reticulocytosis, normal haptoglobin. There were schistocytes on the smear, but he has no other evidence of MAHA. ADAMTS 13 has been sent.   Schistocytes could be due to being on CRRT.  Fibrinogen was normal, less likely DIC. 4T score is low,unlikely HIT.  HIT panel has been sent previously as well.   # s/p Cardiac arrest, acute respiratory failure, Aspiration pneumonia, Septic shock, prognosis is poor. Managed by ICU.  Thank you for allowing me to participate in the care of this patient.   Earlie Server, MD, PhD 06/05/2021

## 2021-06-05 NOTE — Progress Notes (Signed)
Patient intermittently following commands during assessments. Went into Afib 150's-190's this am. EKG obtained and amiodarone drip ordered. Electrolytes replaced. CRRT clotted and restarted with no complications. Rectal tube intact. Per Dr. Mortimer Fries hooked up OG tube to LIS. No output during my shift. Foley catheter intact with 8 cc urine output for my shift. Family at bedside throughout the shift. Continue to assess.

## 2021-06-05 NOTE — Progress Notes (Signed)
GOALS OF CARE DISCUSSION  The Clinical status was relayed to family in detail. Aunts at bedside Updated and notified of patients medical condition.    Patient remains unresponsive and will not open eyes to command.   Patient is having a weak cough and struggling to remove secretions.   Patient with increased WOB and using accessory muscles to breathe Explained to family course of therapy and the modalities    Patient with Progressive multiorgan failure with a very high probablity of a very minimal chance of meaningful recovery despite all aggressive and optimal medical therapy.  PATIENT REMAINS FULL CODE  Family understands the situation.   Family are satisfied with Plan of action and management. All questions answered  Additional CC time 35 mins   Yaziel  Patricia Pesa, M.D.  [facility]  Medical Director [place] Director [facility] Cardio-Pulmonary Department

## 2021-06-05 NOTE — Progress Notes (Signed)
Called and spoke with Dr. Humphrey Rolls regarding patients sustaining heart rate 150-190's, afib per patients EKG. MD requested to speak with Dr. Mortimer Fries. Orders to start amiodarone bolus/drip and to titrate levophed to maintain blood pressure.Bolus has been given and patient on the 60 mg/hr rate. Heart rate currently in the 140-150's, afib. CRRT restarted with no complications. Tammy called for update. Continue to assess.

## 2021-06-05 NOTE — Progress Notes (Signed)
NAME:  Jonathan Mcmillan, MRN:  [id number], DOB:  05/06/1996, LOS: 5 ADMISSION DATE:  05/31/2021, CONSULTATION DATE:  06/01/2021 REFERRING MD:  Dr. Leslye Peer, CHIEF COMPLAINT:  Cardiac Arrest   Brief Pt Description / Synopsis:  26 y.o. admitted with AKI on CKD in setting of viral syndrome and diarrhea.  Suffered in-hospital cardiac arrest (initial rhythm asystole), suspect due to severe metabolic acidosis and multiple metabolic derangements.  History of Present Illness:  Jonathan Mcmillan is a 27 year old male with a past medical history significant for diabetes mellitus type 1, chronic kidney disease stage IV, hypertension, nicotine dependence who presented to [facility] ED on 05/31/2021 due to 3-day history of fever, chills, nonproductive cough, myalgias, and diarrhea. Patient denied urinary frequency, nocturia, dysuria, abdominal pain, hematemesis, chest pain, shortness of breath, edema.  ED Course: Initial vital signs: Temperature 102.6, RR 22, HR 104, BP 139/70, SpO2 97% on room air Significant labs: Sodium 131, potassium 4.4, chloride 103, bicarb 15, glucose 177, BUN 19, creatinine 13.86 (baseline of 3.92), calcium 6.7, alkaline phosphatase 70, lipase 29, AST 24, ALT 33, lactic acid 2.1, WBC 12.7, hemoglobin 5.2 (baseline of 8.3) hematocrit 15.8, MCV 89.8, RDW 12.8, platelets 172, PT 15.8, INR 1.3. COVID-19 and influenza PCR negative Respiratory viral panel negative Urinalysis with proteinuria Imaging: Chest x-ray negative for acute cardiopulmonary disease CT Chest w/o contrast>>1. Trace pericardial effusion and mild diffuse body wall edema, new since 01/02/2020. 2. Clear lungs, with no focal consolidation or pleural effusion. 3. Hypodense blood pool consistent with anemia. Renal US>>Increased renal echogenicity compatible with medical renal disease. Retroperitoneal perinephric edema suspect related to volume overload.  Patient was admitted by the hospitalist for further work-up and treatment of  acute kidney injury on CKD.  Nephrology was consulted.   Early in the morning on 06/01/2021 rapid response was called when he was found in the shower on a chair passed out. Found to be  hypotensive, hypoglycemic, and lethargic with his "eyes rolling in the back of his head."  Patient was given 500 cc fluid bolus and ordered for blood transfusion.  However CODE BLUE was called as pt was found to be in asystole (progressed to PEA).  He received epinephrine x2, sodium bicarbonate x2, calcium x1, glucose and insulin.  Pulse was regained and he was transferred to ICU.  Upon arrival to the ICU patient patient is critically ill and concern for decorticate posturing.  PCCM consulted for further management.  Interval History Severe mulitorgan failure  On pressors Remains on vent On CRRT Severe thrombocytopenia s/p plt transfusion for bleeding   Pertinent  Medical History  Chronic Kidney Disease Stage IV Diabetes Mellitus 1  Micro Data:  05/31/2021: SARS-CoV-2 and influenza PCR>> negative 05/31/2021: Respiratory viral panel>> negative 05/31/2021: Group A strep PCR>> not detected 05/31/2021: Blood culture x2>> no growth to date 05/31/2021: GI panel>> negative 05/31/2021: C. Difficile>> negative 06/01/2021: Tracheal aspirate>> gram + cocci, gram + rods, gram - rods,  06/01/2021: Urine>> 06/01/2021: Blood culture>>  Antimicrobials:  Ceftriaxone 1/19 x 1 dose Vancomycin 1/19>> Zosyn 1/19>>  Significant Hospital Events: Including procedures, antibiotic start and stop dates in addition to other pertinent events   1/18: Admitted by the hospitalist.  Nephrology consulted, plans for vascular to place temporary dialysis catheter with initiation of dialysis 1/19: In-hospital cardiac arrest.  Critically ill. Transferred to ICU.  Plan for CRRT.  Central line, A line, and Trialysis catheter placed.  Concern for possible anoxic brain injury.  Sepsis workup in progress 1/20: Pt is awake,  tracking, following  simple commands. Remains on CRRT, bicarb gtt d/c.  Weaning fiO2 and pressors. Tracheal aspirate growing gram + cocci, gram + rods, gram - rods 1/21: Extubated; leukocytosis, thrombocytopenia 1/22: Reintubated overnight; apparent large volume aspiration; worsening shock 1/23 remains with multiorgan failure, plt transfusion    Objective   Blood pressure 133/75, pulse (!) 109, temperature 99.3 F (37.4 C), resp. rate (!) 9, height 5' 7.99" (1.727 m), weight 69 kg, SpO2 100 %.    Vent Mode: PRVC FiO2 (%):  [30 %-40 %] 30 % Set Rate:  [20 bmp] 20 bmp Vt Set:  [450 mL] 450 mL PEEP:  [5 cmH20] 5 cmH20 Plateau Pressure:  [14 cmH20] 14 cmH20   Intake/Output Summary (Last 24 hours) at 06/05/2021 0732 Last data filed at 06/05/2021 0700 Gross per 24 hour  Intake 2389.04 ml  Output 2431 ml  Net -41.96 ml    Filed Weights   06/03/21 0409 06/04/21 0423 06/05/21 0423  Weight: 72.7 kg 67.9 kg 69 kg    REVIEW OF SYSTEMS  PATIENT IS UNABLE TO PROVIDE COMPLETE REVIEW OF SYSTEMS DUE TO SEVERE CRITICAL ILLNESS AND TOXIC METABOLIC ENCEPHALOPATHY    PHYSICAL EXAMINATION:  GENERAL:critically ill appearing, +resp distress EYES: Pupils equal, round, reactive to light.  No scleral icterus.  MOUTH: Moist mucosal membrane. INTUBATED NECK: Supple.  PULMONARY: +rhonchi, +wheezing CARDIOVASCULAR: S1 and S2.  No murmurs  GASTROINTESTINAL: Soft, nontender, -distended. Positive bowel sounds.  MUSCULOSKELETAL: No swelling, clubbing, or edema.  NEUROLOGIC: obtunded SKIN:intact,warm,dry    Assessment & Plan:     Admitted to ICU for In-Hospital Cardiac Arrest (initial rhythm asystole), suspect in setting of severe metabolic acidosis and multiple metabolic derangements New systolic heart failure with severe aspiration pneumonia  and multiorgan failure   Severe ACUTE Hypoxic and Hypercapnic Respiratory Failure Acute hypoxic respiratory failure Aspiration pneumonitis -continue Mechanical Ventilator  support -Wean Fio2 and PEEP as tolerated -VAP/VENT bundle implementation - Wean PEEP & FiO2 as tolerated, maintain SpO2 > 88% - Head of bed elevated 30 degrees, VAP protocol in place - Plateau pressures less than 30 cm H20  - Intermittent chest x-ray & ABG PRN - Ensure adequate pulmonary hygiene  -will perform SAT/SBT when respiratory parameters are met  SEPTIC shock SOURCE-lungs, GI -use vasopressors to keep MAP>65 as needed -follow ABG and LA as needed -follow up cultures -emperic ABX -consider stress dose steroids -aggressive IV fluid Resuscitation   ACUTE SYSTOLIC CARDIAC FAILURE- EF 35% -oxygen as needed -follow up cardiac enzymes as indicated -follow up cardiology recs  PMHx: Hypertension -Echocardiogram 06/01/21>>LVEF 35-40% and global hypokinesis, grade II diastolic dysfunction, mildly reduced RV function, and moderately increased Pulmonary systolic pressure -Urine drug screen + for Cannabinoid -Cardiology following, appreciate input  Thrombocytopenia (4T score is 3-4) -Dr. Gale Journey with Hematology consulted -Monitor for S/Sx of bleeding -Trend CBC -SCD's for VTE Prophylaxis  -Transfuse for Hgb <7 -Transfuse platelets for platelet count <10K -Received 2 units prbc's 1/19 -Timeline does not fit with HIT, 4T score is low to moderate probability given his baseline dialysis requirement and probable heparin exposure -Suspect thrombocytopenia in setting of sepsis, however, will continue to monitor -Sent HIT panel, [id number], haptoglobin; doubt HUS but on differential -Obtain peripheral smear -Avoid heparin products    ACUTE KIDNEY INJURY/Renal Failure -continue Foley Catheter-assess need -Avoid nephrotoxic agents -Follow urine output, BMP -Ensure adequate renal perfusion, optimize oxygenation -Renal dose medications Continue CRRT per nephrology   Intake/Output Summary (Last 24 hours) at 06/05/2021 0739 Last data filed at  06/05/2021 0700 Gross per 24 hour  Intake  2389.04 ml  Output 2431 ml  Net -41.96 ml    ENDO - ICU hypoglycemic\Hyperglycemia protocol -check FSBS per protocol   GI GI PROPHYLAXIS as indicated  NUTRITIONAL STATUS DIET-->TF's as tolerated Constipation protocol as indicated   ELECTROLYTES -follow labs as needed -replace as needed -pharmacy consultation and following    Best Practice (right click and "Reselect all SmartList Selections" daily)   Diet/type: NPO DVT prophylaxis: SCD (holding chemical prophylaxis due to thrombocytopenia) GI prophylaxis: PPI Lines: Central line and yes and it is still needed Foley:  Yes, and it is still needed Code Status:  full code   Labs   CBC: Recent Labs  Lab 05/31/21 0835 06/01/21 0404 06/04/21 0234 06/04/21 0936 06/04/21 1126 06/04/21 2308 06/05/21 0417  WBC 12.7*   < > 21.2* 24.1* 25.2* 29.3* 30.3*  NEUTROABS 11.6*  --   --  19.3*  --  21.9* 26.2*  HGB 5.2*   < > 9.0* 8.9* 8.8* 8.7* 8.3*  HCT 15.8*   < > 24.8* 25.4* 24.6* 24.8* 23.8*  MCV 89.8   < > 84.1 84.9 84.8 86.1 86.9  PLT 172   < > 13* 10* 10* 29* 15*   < > = values in this interval not displayed.     Basic Metabolic Panel: Recent Labs  Lab 06/02/21 0357 06/02/21 1535 06/03/21 0307 06/03/21 0500 06/03/21 1416 06/04/21 0016 06/04/21 0234 06/04/21 0508 06/04/21 1126 06/04/21 1611 06/05/21 0410  NA 130*   < >  --  131* 136 134*  --  134* 133* 134* 133*  K 3.1*   < >  --  3.5 3.2* 2.9*  --  3.1* 3.2* 3.4* 3.5  CL 99   < >  --  97* 102 101  --  101 103 103 104  CO2 19*   < >  --  23 24 24   --  23 22 23 22   GLUCOSE 95   < >  --  146* 134* 134*  --  154* 175* 193* 182*  BUN 58*   < >  --  23* 17 17  --  15 15 15 17   CREATININE 7.70*   < >  --  2.85* 1.97* 1.81*  --  1.68* 1.54* 1.52* 1.42*  CALCIUM 6.6*   < >  --  7.9* 8.3* 8.1*  --  7.9* 7.9* 7.7* 7.2*  MG 1.6*  --  1.8  --   --   --  1.9  --   --   --  2.2  PHOS 5.8*   < >  --  3.7 2.2*  --   --  1.5*  --  1.6* 1.7*   < > = values in this  interval not displayed.    GFR: Estimated Creatinine Clearance: 75.6 mL/min (A) (by C-G formula based on SCr of 1.42 mg/dL (H)). Recent Labs  Lab 06/02/21 0357 06/02/21 0916 06/02/21 1123 06/03/21 0307 06/04/21 0016 06/04/21 0158 06/04/21 0210 06/04/21 0234 06/04/21 0936 06/04/21 1126 06/04/21 2308 06/05/21 0410 06/05/21 0417  PROCALCITON 93.56  --   --  67.49  --  34.14  --   --   --   --   --  49.08  --   WBC 8.6  --   --  25.9* 22.5*  --   --    < > 24.1* 25.2* 29.3*  --  30.3*  LATICACIDVEN  --    < >  4.3*  --  2.8*  --  2.8*  --  3.3*  --   --   --   --    < > = values in this interval not displayed.     Liver Function Tests: Recent Labs  Lab 05/31/21 0835 06/01/21 0404 06/01/21 1325 06/01/21 1937 06/03/21 1416 06/04/21 0016 06/04/21 0508 06/04/21 1611 06/05/21 0410  AST 24  --  101*  --  21 18  --   --   --   ALT 33  --  78*  --  42 39  --   --   --   ALKPHOS 70  --  68  --  91 89  --   --   --   BILITOT 0.6  --  1.1  --  2.2* 2.3*  --   --   --   PROT 6.3*  --  4.7*  --  4.4* 4.6*  --   --   --   ALBUMIN 2.9*   < > 2.0*   < > 1.7*   1.7* 1.8* 1.8* 1.7* 1.7*   < > = values in this interval not displayed.    Recent Labs  Lab 05/31/21 0835  LIPASE 29    Recent Labs  Lab 06/04/21 0016  AMMONIA 23     ABG    Component Value Date/Time   PHART 7.39 06/04/2021 0508   PCO2ART 40 06/04/2021 0508   PO2ART 109 (H) 06/04/2021 0508   HCO3 24.2 06/04/2021 0508   TCO2 22 06/24/2007 0045   ACIDBASEDEF 0.7 06/04/2021 0508   O2SAT 98.2 06/04/2021 0508      Coagulation Profile: Recent Labs  Lab 05/31/21 0835 06/01/21 1715 06/04/21 0234  INR 1.3* 2.3* 1.3*     Cardiac Enzymes: Recent Labs  Lab 05/31/21 1114  CKTOTAL 411*     HbA1C: Hemoglobin-A1c  Date/Time Value Ref Range Status  06/26/2007 06:40 AM   Final   17.6% HIGH (NOTE) Reference Intervals:  Diabetic Adult        <9.0 Healthy Adult         3.9 - 7.3 Current ADA guidelines  recommend a treatment goal of <7.0% HgbA1c for diabetic patients, which corresponds to a <9.0% Glycohemoglobin result with this method.   Performed at:  [facility]               [place], [place]  [id number]   Hemoglobin A1C  Date/Time Value Ref Range Status  08/05/2013 01:26 PM 12.9 (H) 4.2 - 6.3 % Final    Comment:    The American Diabetes Association recommends that a primary goal of therapy should be <7% and that physicians should reevaluate the treatment regimen in patients with HbA1c values consistently >8%.    Hgb A1c MFr Bld  Date/Time Value Ref Range Status  05/31/2021 12:05 PM 5.7 (H) 4.8 - 5.6 % Final    Comment:    RESULTS CONFIRMED BY MANUAL DILUTION REPEATED TO VERIFY (NOTE) Pre diabetes:          5.7%-6.4%  Diabetes:              >6.4%  Glycemic control for   <7.0% adults with diabetes   06/11/2020 03:41 AM 7.0 (H) 4.8 - 5.6 % Final    Comment:    (NOTE) Pre diabetes:          5.7%-6.4%  Diabetes:              >6.4%  Glycemic control for   <7.0% adults with diabetes     CBG: Recent Labs  Lab 06/04/21 1610 06/04/21 1917 06/04/21 2302 06/05/21 0309 06/05/21 0713  GLUCAP 170* 154* 186* 152* 147*    Home Medications  Prior to Admission medications   Medication Sig Start Date End Date Taking? Authorizing Provider  ondansetron (ZOFRAN) 4 MG tablet Take 1 tablet (4 mg total) by mouth every 8 (eight) hours as needed for up to 10 doses for nausea or vomiting. 05/31/21  Yes Lucrezia Starch, MD  amLODipine (NORVASC) 10 MG tablet Take 1 tablet (10 mg total) by mouth daily. Patient not taking: Reported on 06/01/2021 06/14/20   Fritzi Mandes, MD  hydrALAZINE (APRESOLINE) 50 MG tablet Take 1 tablet (50 mg total) by mouth 2 (two) times daily. Patient not taking: Reported on 06/01/2021 06/13/20   Fritzi Mandes, MD  insulin glargine (LANTUS) 100 UNIT/ML injection Inject 20 Units into the skin at bedtime. Patient not taking: Reported on  06/01/2021 08/26/20   [provider]  omeprazole (PRILOSEC) 40 MG capsule Take 1 capsule (40 mg total) by mouth daily. 03/08/20 05/07/20  Arta Silence, MD  pantoprazole (PROTONIX) 40 MG tablet Take 1 tablet (40 mg total) by mouth daily. Patient not taking: Reported on 12/10/2018 09/10/18 01/02/20  Hillary Bow, MD  sucralfate (CARAFATE) 1 g tablet Take 1 tablet (1 g total) by mouth 4 (four) times daily. Patient not taking: Reported on 06/26/2019 02/04/19 01/02/20  Nance Pear, MD      DVT/GI PRX  assessed I Assessed the need for Labs I Assessed the need for Foley I Assessed the need for Central Venous Line Family Discussion when available I Assessed the need for Mobilization I made an Assessment of medications to be adjusted accordingly Safety Risk assessment completed  CASE DISCUSSED IN MULTIDISCIPLINARY ROUNDS WITH ICU TEAM     Critical Care Time devoted to patient care services described in this note is 55 minutes.  Critical care was necessary to treat /prevent imminent and life-threatening deterioration. Overall, patient is critically ill, prognosis is guarded.  Patient with Multiorgan failure and at high risk for cardiac arrest and death.    Corrin Parker, M.D.  [facility]  Medical Director [place] Director [facility] Cardio-Pulmonary Department

## 2021-06-05 NOTE — Progress Notes (Signed)
PHARMACY NOTE:  ANTIMICROBIAL RENAL DOSAGE ADJUSTMENT  Current antimicrobial regimen includes a mismatch between antimicrobial dosage and estimated renal function.  As per policy approved by the Pharmacy & Therapeutics and Medical Executive Committees, the antimicrobial dosage will be adjusted accordingly.  Current antimicrobial dosage:  Cefepime 2g q8h  Indication: HCAP  Renal Function:  Estimated Creatinine Clearance: 75.6 mL/min (A) (by C-G formula based on SCr of 1.42 mg/dL (H)). []      On intermittent HD, scheduled: [x]      On CRRT    Antimicrobial dosage has been changed to:  Cefepime 2g q12h  Additional comments:Adjusted for CRRT  Thank you for allowing pharmacy to be a part of this patient's care.  KEITHAN DILEONARDO, [facility] 06/05/2021 7:52 AM

## 2021-06-05 NOTE — Progress Notes (Signed)
[facility]  ROUNDING NOTE   Subjective:   Patient was extubated on 1/21 but had to be re-intubated for respiratory distress; suspected to have aspirated Neuro- sedated with propofol, fentanyl Pulm - vent assisted; fio2 30% Cvs: pressors - nor epi, A-line Renal- low UOP; crrt continued   Objective:  Vital signs in last 24 hours:  Temp:  [97.5 F (36.4 C)-100.4 F (38 C)] 99.1 F (37.3 C) (01/23 1100) Pulse Rate:  [27-119] 67 (01/23 1100) Resp:  [8-20] 9 (01/22 2203) BP: (90-166)/(55-96) 90/71 (01/23 1100) SpO2:  [99 %-100 %] 100 % (01/23 1136) Arterial Line BP: (88-177)/(39-89) 95/39 (01/23 1100) FiO2 (%):  [30 %] 30 % (01/23 1136) Weight:  [69 kg] 69 kg (01/23 0423)  Weight change: 1.1 kg Filed Weights   06/03/21 0409 06/04/21 0423 06/05/21 0423  Weight: 72.7 kg 67.9 kg 69 kg    Intake/Output: I/O last 3 completed shifts: In: 3336.7 [I.V.:2318.2; Blood:196.3; Other:15; IV Piggyback:807.2] Out: 5361 [Urine:11; WERXV:4008; Stool:425]   Intake/Output this shift:  Total I/O In: 51.1 [I.V.:51.1] Out: 70 [Other:70]  Physical Exam: General: Critically ill  Eyes: Anicteric  Lungs:  Vent assisted  Heart:  tachycardic  Abdomen:  Soft,    Extremities: + peripheral edema.  Neurologic: Intubated.    Skin: No lesions  Access:  Right femoral temp HD catheter 06/01/21  Rectal tube, Foley in place  Basic Metabolic Panel: Recent Labs  Lab 06/02/21 0357 06/02/21 1535 06/03/21 0307 06/03/21 0500 06/03/21 1416 06/04/21 0016 06/04/21 0234 06/04/21 0508 06/04/21 1126 06/04/21 1611 06/05/21 0410  NA 130*   < >  --  131* 136 134*  --  134* 133* 134* 133*  K 3.1*   < >  --  3.5 3.2* 2.9*  --  3.1* 3.2* 3.4* 3.5  CL 99   < >  --  97* 102 101  --  101 103 103 104  CO2 19*   < >  --  23 24 24   --  23 22 23 22   GLUCOSE 95   < >  --  146* 134* 134*  --  154* 175* 193* 182*  BUN 58*   < >  --  23* 17 17  --  15 15 15 17   CREATININE 7.70*   < >  --  2.85* 1.97*  1.81*  --  1.68* 1.54* 1.52* 1.42*  CALCIUM 6.6*   < >  --  7.9* 8.3* 8.1*  --  7.9* 7.9* 7.7* 7.2*  MG 1.6*  --  1.8  --   --   --  1.9  --   --   --  2.2  PHOS 5.8*   < >  --  3.7 2.2*  --   --  1.5*  --  1.6* 1.7*   < > = values in this interval not displayed.     Liver Function Tests: Recent Labs  Lab 05/31/21 0835 06/01/21 0404 06/01/21 1325 06/01/21 1937 06/03/21 1416 06/04/21 0016 06/04/21 0508 06/04/21 1611 06/05/21 0410  AST 24  --  101*  --  21 18  --   --   --   ALT 33  --  78*  --  42 39  --   --   --   ALKPHOS 70  --  68  --  91 89  --   --   --   BILITOT 0.6  --  1.1  --  2.2* 2.3*  --   --   --  PROT 6.3*  --  4.7*  --  4.4* 4.6*  --   --   --   ALBUMIN 2.9*   < > 2.0*   < > 1.7*   1.7* 1.8* 1.8* 1.7* 1.7*   < > = values in this interval not displayed.    Recent Labs  Lab 05/31/21 0835  LIPASE 29    Recent Labs  Lab 06/04/21 0016  AMMONIA 23     CBC: Recent Labs  Lab 05/31/21 0835 06/01/21 0404 06/04/21 0234 06/04/21 0936 06/04/21 1126 06/04/21 2308 06/05/21 0417  WBC 12.7*   < > 21.2* 24.1* 25.2* 29.3* 30.3*  NEUTROABS 11.6*  --   --  19.3*  --  21.9* 26.2*  HGB 5.2*   < > 9.0* 8.9* 8.8* 8.7* 8.3*  HCT 15.8*   < > 24.8* 25.4* 24.6* 24.8* 23.8*  MCV 89.8   < > 84.1 84.9 84.8 86.1 86.9  PLT 172   < > 13* 10* 10* 29* 15*   < > = values in this interval not displayed.     Cardiac Enzymes: Recent Labs  Lab 05/31/21 1114  CKTOTAL 411*     BNP: Invalid input(s): POCBNP  CBG: Recent Labs  Lab 06/04/21 1917 06/04/21 2302 06/05/21 0309 06/05/21 0713 06/05/21 1109  GLUCAP 154* 186* 152* 147* 177*     Microbiology: Results for orders placed or performed during the hospital encounter of 05/31/21  Resp Panel by RT-PCR (Flu A&B, Covid) Nasopharyngeal Swab     Status: None   Collection Time: 05/31/21  6:58 AM   Specimen: Nasopharyngeal Swab; Nasopharyngeal(NP) swabs in vial transport medium  Result Value Ref Range Status    SARS Coronavirus 2 by RT PCR NEGATIVE NEGATIVE Final    Comment: (NOTE) SARS-CoV-2 target nucleic acids are NOT DETECTED.  The SARS-CoV-2 RNA is generally detectable in upper respiratory specimens during the acute phase of infection. The lowest concentration of SARS-CoV-2 viral copies this assay can detect is 138 copies/mL. A negative result does not preclude SARS-Cov-2 infection and should not be used as the sole basis for treatment or other patient management decisions. A negative result may occur with  improper specimen collection/handling, submission of specimen other than nasopharyngeal swab, presence of viral mutation(s) within the areas targeted by this assay, and inadequate number of viral copies(<138 copies/mL). A negative result must be combined with clinical observations, patient history, and epidemiological information. The expected result is Negative.  Fact Sheet for Patients:  EntrepreneurPulse.com.au  Fact Sheet for Healthcare Providers:  IncredibleEmployment.be  This test is no t yet approved or cleared by the [place] FDA and  has been authorized for detection and/or diagnosis of SARS-CoV-2 by FDA under an Emergency Use Authorization (EUA). This EUA will remain  in effect (meaning this test can be used) for the duration of the COVID-19 declaration under Section 564(b)(1) of the Act, 21 U.S.C.section 360bbb-3(b)(1), unless the authorization is terminated  or revoked sooner.       Influenza A by PCR NEGATIVE NEGATIVE Final   Influenza B by PCR NEGATIVE NEGATIVE Final    Comment: (NOTE) The Xpert Xpress SARS-CoV-2/FLU/RSV plus assay is intended as an aid in the diagnosis of influenza from Nasopharyngeal swab specimens and should not be used as a sole basis for treatment. Nasal washings and aspirates are unacceptable for Xpert Xpress SARS-CoV-2/FLU/RSV testing.  Fact Sheet for  Patients: EntrepreneurPulse.com.au  Fact Sheet for Healthcare Providers: IncredibleEmployment.be  This test is not yet approved or cleared  by the [place] and has been authorized for detection and/or diagnosis of SARS-CoV-2 by FDA under an Emergency Use Authorization (EUA). This EUA will remain in effect (meaning this test can be used) for the duration of the COVID-19 declaration under Section 564(b)(1) of the Act, 21 U.S.C. section 360bbb-3(b)(1), unless the authorization is terminated or revoked.  Performed at [facility], [place], [place] [address]   Respiratory (~20 pathogens) panel by PCR     Status: None   Collection Time: 05/31/21  6:58 AM   Specimen: Nasopharyngeal Swab; Respiratory  Result Value Ref Range Status   Adenovirus NOT DETECTED NOT DETECTED Final   Coronavirus 229E NOT DETECTED NOT DETECTED Final    Comment: (NOTE) The Coronavirus on the Respiratory Panel, DOES NOT test for the novel  Coronavirus (2019 nCoV)    Coronavirus HKU1 NOT DETECTED NOT DETECTED Final   Coronavirus NL63 NOT DETECTED NOT DETECTED Final   Coronavirus OC43 NOT DETECTED NOT DETECTED Final   Metapneumovirus NOT DETECTED NOT DETECTED Final   Rhinovirus / Enterovirus NOT DETECTED NOT DETECTED Final   Influenza A NOT DETECTED NOT DETECTED Final   Influenza B NOT DETECTED NOT DETECTED Final   Parainfluenza Virus 1 NOT DETECTED NOT DETECTED Final   Parainfluenza Virus 2 NOT DETECTED NOT DETECTED Final   Parainfluenza Virus 3 NOT DETECTED NOT DETECTED Final   Parainfluenza Virus 4 NOT DETECTED NOT DETECTED Final   Respiratory Syncytial Virus NOT DETECTED NOT DETECTED Final   Bordetella pertussis NOT DETECTED NOT DETECTED Final   Bordetella Parapertussis NOT DETECTED NOT DETECTED Final   Chlamydophila pneumoniae NOT DETECTED NOT DETECTED Final   Mycoplasma pneumoniae NOT DETECTED NOT DETECTED Final    Comment: Performed at  [facility], [place]. [address] [place], [place] [address]  Group A Strep by PCR [facility] Only)     Status: None   Collection Time: 05/31/21  8:35 AM   Specimen: Throat; Sterile Swab  Result Value Ref Range Status   Group A Strep by PCR NOT DETECTED NOT DETECTED Final    Comment: Performed at [facility], [address] [place], [place] [address]  Blood culture (routine x 2)     Status: None   Collection Time: 05/31/21  8:39 AM   Specimen: BLOOD  Result Value Ref Range Status   Specimen Description BLOOD BLOOD RIGHT FOREARM  Final   Special Requests   Final    BOTTLES DRAWN AEROBIC AND ANAEROBIC Blood Culture results may not be optimal due to an excessive volume of blood received in culture bottles   Culture   Final    NO GROWTH 5 DAYS Performed at [facility], [address] [place], [place] [address]    Report Status 06/05/2021 FINAL  Final  Blood culture (routine x 2)     Status: None   Collection Time: 05/31/21  8:39 AM   Specimen: BLOOD  Result Value Ref Range Status   Specimen Description BLOOD RIGHT ANTECUBITAL  Final   Special Requests   Final    BOTTLES DRAWN AEROBIC AND ANAEROBIC Blood Culture results may not be optimal due to an excessive volume of blood received in culture bottles   Culture   Final    NO GROWTH 5 DAYS Performed at [facility], [address] [place], [place] [address]    Report Status 06/05/2021 FINAL  Final  C Difficile Quick Screen w PCR reflex     Status:  None   Collection Time: 05/31/21  1:20 PM   Specimen: Stool  Result Value Ref Range Status   C Diff antigen NEGATIVE NEGATIVE Final   C Diff toxin NEGATIVE NEGATIVE Final   C Diff interpretation No C. difficile detected.  Final    Comment: Performed at [facility], [address] [place], [place] [address]  Gastrointestinal Panel by PCR , Stool     Status: None   Collection Time: 05/31/21  1:30 PM   Specimen: Stool  Result Value Ref Range  Status   Campylobacter species NOT DETECTED NOT DETECTED Final   Plesimonas shigelloides NOT DETECTED NOT DETECTED Final   Salmonella species NOT DETECTED NOT DETECTED Final   Yersinia enterocolitica NOT DETECTED NOT DETECTED Final   Vibrio species NOT DETECTED NOT DETECTED Final   Vibrio cholerae NOT DETECTED NOT DETECTED Final   Enteroaggregative E coli (EAEC) NOT DETECTED NOT DETECTED Final   Enteropathogenic E coli (EPEC) NOT DETECTED NOT DETECTED Final   Enterotoxigenic E coli (ETEC) NOT DETECTED NOT DETECTED Final   Shiga like toxin producing E coli (STEC) NOT DETECTED NOT DETECTED Final   Shigella/Enteroinvasive E coli (EIEC) NOT DETECTED NOT DETECTED Final   Cryptosporidium NOT DETECTED NOT DETECTED Final   Cyclospora cayetanensis NOT DETECTED NOT DETECTED Final   Entamoeba histolytica NOT DETECTED NOT DETECTED Final   Giardia lamblia NOT DETECTED NOT DETECTED Final   Adenovirus F40/41 NOT DETECTED NOT DETECTED Final   Astrovirus NOT DETECTED NOT DETECTED Final   Norovirus GI/GII NOT DETECTED NOT DETECTED Final   Rotavirus A NOT DETECTED NOT DETECTED Final   Sapovirus (I, II, IV, and V) NOT DETECTED NOT DETECTED Final    Comment: Performed at [facility], [address], [place], [place] [address]  Urine Culture     Status: None   Collection Time: 06/01/21 10:23 AM   Specimen: In/Out Cath Urine  Result Value Ref Range Status   Specimen Description   Final    IN/OUT CATH URINE Performed at [facility], [address], [place], [place] [address]    Special Requests   Final    NONE Performed at [facility], [address] [place], [place] [address]    Culture   Final    NO GROWTH Performed at [facility], [address] [place], [place] [address]    Report Status 06/03/2021 FINAL  Final  Culture, Respiratory w Gram Stain     Status: None   Collection Time: 06/01/21  3:59 PM   Specimen: Tracheal Aspirate; Respiratory  Result  Value Ref Range Status   Specimen Description   Final    TRACHEAL ASPIRATE Performed at [facility], [address], [place], [place] [address]    Special Requests   Final    NONE Performed at [facility], [place], [place] [address]    Gram Stain   Final    RARE SQUAMOUS EPITHELIAL CELLS PRESENT MODERATE WBC PRESENT,BOTH PMN AND MONONUCLEAR MODERATE GRAM POSITIVE COCCI FEW GRAM NEGATIVE RODS FEW GRAM POSITIVE RODS RARE YEAST    Culture   Final    FEW Consistent with normal respiratory flora. No Pseudomonas species isolated Performed at [facility] [address] [place], [place] [address]    Report Status 06/03/2021 FINAL  Final  CULTURE, BLOOD (ROUTINE X 2) w Reflex to ID Panel     Status: None (Preliminary result)   Collection Time: 06/01/21  7:06 PM   Specimen: BLOOD  Result Value Ref Range Status   Specimen Description BLOOD RIGHT ANTECUBITAL  Final   Special Requests   Final    BOTTLES DRAWN AEROBIC ONLY Blood Culture results may not be optimal due to an inadequate volume of blood received in culture bottles   Culture   Final    NO GROWTH 4 DAYS Performed at [facility], [address] [place], [place] [address]    Report Status PENDING  Incomplete  CULTURE, BLOOD (ROUTINE X 2) w Reflex to ID Panel     Status: None (Preliminary result)   Collection Time: 06/01/21  7:37 PM   Specimen: BLOOD  Result Value Ref Range Status   Specimen Description BLOOD [facility]  Final   Special Requests BOTTLES DRAWN AEROBIC AND ANAEROBIC BCLV  Final   Culture   Final    NO GROWTH 4 DAYS Performed at [facility], [address] [place], [place] [address]    Report Status PENDING  Incomplete  MRSA Next Gen by PCR, Nasal     Status: None   Collection Time: 06/02/21 10:46 AM   Specimen: Nasal Mucosa; Nasal Swab  Result Value Ref Range Status   MRSA by PCR Next Gen NOT DETECTED NOT DETECTED Final    Comment: (NOTE) The  GeneXpert MRSA Assay (FDA approved for NASAL specimens only), is one component of a comprehensive MRSA colonization surveillance program. It is not intended to diagnose MRSA infection nor to guide or monitor treatment for MRSA infections. Test performance is not FDA approved in patients less than 37 years old. Performed at [facility], [address] [place], [place] [address]   Culture, Respiratory w Gram Stain     Status: None (Preliminary result)   Collection Time: 06/04/21  7:38 AM   Specimen: Tracheal Aspirate; Respiratory  Result Value Ref Range Status   Specimen Description   Final    TRACHEAL ASPIRATE Performed at [facility], [address], [place], [place] [address]    Special Requests   Final    NONE Performed at [facility], [address] [place], [place] [address]    Gram Stain   Final    MODERATE WBC PRESENT,BOTH PMN AND MONONUCLEAR NO ORGANISMS SEEN    Culture   Final    FEW YEAST CULTURE REINCUBATED FOR BETTER GROWTH Performed at [facility], [place] [address], [place], [place] [address]    Report Status PENDING  Incomplete  CULTURE, BLOOD (ROUTINE X 2) w Reflex to ID Panel     Status: None (Preliminary result)   Collection Time: 06/04/21  6:35 PM   Specimen: BLOOD  Result Value Ref Range Status   Specimen Description BLOOD BLOOD LEFT HAND  Final   Special Requests   Final    BOTTLES DRAWN AEROBIC AND ANAEROBIC Blood Culture adequate volume   Culture   Final    NO GROWTH < 12 HOURS Performed at [facility], [address], [place], [place] [address]    Report Status PENDING  Incomplete    Coagulation Studies: Recent Labs    06/04/21 0234  LABPROT 16.6*  INR 1.3*     Urinalysis: No results for input(s): COLORURINE, LABSPEC, PHURINE, GLUCOSEU, HGBUR, BILIRUBINUR, KETONESUR, PROTEINUR, UROBILINOGEN, NITRITE, LEUKOCYTESUR in the last 72 hours.  Invalid input(s): APPERANCEUR     Imaging: DG  Chest 1 View  Result Date: 06/03/2021 CLINICAL DATA:  Post intubation EXAM: CHEST  1  VIEW COMPARISON:  06/02/2021, 05/31/2021, 03/08/2020 FINDINGS: Tip of the endotracheal tube is about 3.3 cm superior to carina. Cardiomegaly with bilateral effusions and worsened basilar airspace disease. Vascular congestion. Esophageal tube has been removed. IMPRESSION: 1. Endotracheal tube tip about 3.3 cm superior to carina. 2. Cardiomegaly with interim development of bilateral effusions and basilar airspace disease. Vascular congestion and mild edema. Overall aeration is worse as compared with 06/02/2021. Electronically Signed   By: Donavan Foil M.D.   On: 06/03/2021 23:24   DG Abd 1 View  Result Date: 06/04/2021 CLINICAL DATA:  OG tube. EXAM: ABDOMEN - 1 VIEW COMPARISON:  CT CAP, 06/03/2021.  KUB, 06/03/2021 and 06/01/2021. FINDINGS: Support lines: Enteric tube with tip and side port within stomach. RIGHT femoral dialysis catheter, incompletely imaged. Overlying pacer leads. The bowel gas pattern is normal.  No interval osseous abnormality. IMPRESSION: 1. Well-positioned enteric feeding tube, with tip and side port within stomach. Additional lines and tubes as above. 2. Nonobstructed bowel-gas. Electronically Signed   By: Michaelle Birks M.D.   On: 06/04/2021 08:39   DG Abd 1 View  Result Date: 06/03/2021 CLINICAL DATA:  Distension EXAM: ABDOMEN - 1 VIEW COMPARISON:  06/01/2021 FINDINGS: Nonobstructed gas pattern with paucity of bowel gas. Bilateral femoral catheters, right catheter tip over lies the right aspect of the L2 vertebral body. Left catheter tip overlies the upper aspect of left sacrum. IMPRESSION: Nonobstructed gas pattern with overall paucity of bowel gas. Esophageal tube has been removed. Electronically Signed   By: Donavan Foil M.D.   On: 06/03/2021 23:25   CT HEAD WO CONTRAST (5MM)  Result Date: 06/04/2021 CLINICAL DATA:  Initial evaluation for neuro deficit, stroke suspected. EXAM: CT HEAD WITHOUT  CONTRAST TECHNIQUE: Contiguous axial images were obtained from the base of the skull through the vertex without intravenous contrast. RADIATION DOSE REDUCTION: This exam was performed according to the departmental dose-optimization program which includes automated exposure control, adjustment of the mA and/or kV according to patient size and/or use of iterative reconstruction technique. COMPARISON:  Prior CT from 06/01/2021. FINDINGS: Brain: Cerebral volume stable, and remains within normal limits. No acute intracranial hemorrhage. No acute large vessel territory infarct. No mass lesion or midline shift. No hydrocephalus or extra-axial fluid collection. Vascular: No hyperdense vessel. Skull: Scalp soft tissues demonstrate no new abnormality. Suspected anasarca. Calvarium intact. Sinuses/Orbits: Globes and orbital soft tissues demonstrate no acute finding. Scattered mucosal thickening noted about the sphenoid ethmoidal and maxillary sinuses. Few scattered superimposed small air-fluid levels noted. Trace bilateral mastoid effusions. Other: None. IMPRESSION: 1. Stable head CT with no acute intracranial abnormality. 2. Scattered paranasal sinus disease with superimposed small air-fluid levels, suggesting acute sinusitis. Associated trace mastoid effusions. Appearance is similar to previous. Electronically Signed   By: Jeannine Boga M.D.   On: 06/04/2021 01:14   US Venous Img Lower Bilateral (DVT)  Result Date: 06/04/2021 CLINICAL DATA:  Initial evaluation for bilateral lower extremity swelling. EXAM: BILATERAL LOWER EXTREMITY VENOUS DOPPLER ULTRASOUND TECHNIQUE: Gray-scale sonography with graded compression, as well as color Doppler and duplex ultrasound were performed to evaluate the lower extremity deep venous systems from the level of the common femoral vein and including the common femoral, femoral, profunda femoral, popliteal and calf veins including the posterior tibial, peroneal and gastrocnemius veins  when visible. The superficial great saphenous vein was also interrogated. Spectral Doppler was utilized to evaluate flow at rest and with distal augmentation maneuvers in the common femoral, femoral and popliteal veins. COMPARISON:  None. FINDINGS:  RIGHT LOWER EXTREMITY Common Femoral Vein: No evidence of thrombus. Normal compressibility, respiratory phasicity and response to augmentation. Saphenofemoral Junction: No evidence of thrombus. Normal compressibility and flow on color Doppler imaging. Profunda Femoral Vein: No evidence of thrombus. Normal compressibility and flow on color Doppler imaging. Femoral Vein: No evidence of thrombus. Normal compressibility, respiratory phasicity and response to augmentation. Popliteal Vein: No evidence of thrombus. Normal compressibility, respiratory phasicity and response to augmentation. Calf Veins: No evidence of thrombus. Normal compressibility and flow on color Doppler imaging. Superficial Great Saphenous Vein: No evidence of thrombus. Normal compressibility. Venous Reflux:  None. Other Findings:  None. LEFT LOWER EXTREMITY Common Femoral Vein: Not seen, obscured by overlying bandaging material. Saphenofemoral Junction: Not seen, obscured by overlying bandaging material. Profunda Femoral Vein: Not seen, obscured by overlying bandaging material. Femoral Vein: No evidence of thrombus. Normal compressibility, respiratory phasicity and response to augmentation. Popliteal Vein: No evidence of thrombus. Normal compressibility, respiratory phasicity and response to augmentation. Calf Veins: No evidence of thrombus. Normal compressibility and flow on color Doppler imaging. Superficial Great Saphenous Vein: No evidence of thrombus. Normal compressibility. Venous Reflux:  None. Other Findings:  None. IMPRESSION: 1. Nonvisualization of the left common femoral vein, saphenofemoral junction, and profunda femoral vein, obscured by overlying bandaging material. 2. Otherwise no evidence of  deep venous thrombosis in either lower extremity. Electronically Signed   By: Jeannine Boga M.D.   On: 06/04/2021 05:30   CT CHEST ABDOMEN PELVIS WO CONTRAST  Result Date: 06/04/2021 CLINICAL DATA:  Sepsis. EXAM: CT CHEST, ABDOMEN AND PELVIS WITHOUT CONTRAST TECHNIQUE: Multidetector CT imaging of the chest, abdomen and pelvis was performed following the standard protocol without IV contrast. RADIATION DOSE REDUCTION: This exam was performed according to the departmental dose-optimization program which includes automated exposure control, adjustment of the mA and/or kV according to patient size and/or use of iterative reconstruction technique. COMPARISON:  Chest CT dated 05/31/2021. FINDINGS: Evaluation of this exam is limited in the absence of intravenous contrast. Evaluation is also limited due to streak artifact caused by patient's arms as well as anasarca. CT CHEST FINDINGS Cardiovascular: Top-normal cardiac size. No pericardial effusion. There is hypoattenuation of the cardiac blood pool suggestive of anemia. Clinical correlation is recommended. Mild atherosclerotic calcification of the aortic arch. The central pulmonary arteries are grossly unremarkable. Mediastinum/Nodes: Evaluation of the hilar and mediastinal structure is very limited due to factors above. No mediastinal fluid collection. Lungs/Pleura: Small bilateral pleural effusions. There are large areas of consolidative change involving the lower lobes, right greater left. There is near complete consolidation of the right lower lobe. There is impaction of the right lower lobe bronchus as well as impaction of the left lower lobe bronchi which may represent aspiration. No pneumothorax. Tracheostomy with tip approximately 3 cm above the carina. Musculoskeletal: Diffuse chest wall edema and anasarca. No acute osseous pathology. Old healed right posterior rib fracture. CT ABDOMEN PELVIS FINDINGS No intra-abdominal free air.  Small ascites.  Hepatobiliary: The liver is unremarkable. Mild periportal edema. Probable gallbladder sludge. No calcified gallstone. Pancreas: Unremarkable. No pancreatic ductal dilatation or surrounding inflammatory changes. Spleen: Normal in size without focal abnormality. Adrenals/Urinary Tract: The adrenal glands are unremarkable. There is no hydronephrosis or nephrolithiasis on either side. The urinary bladder is decompressed around a Foley catheter. Stomach/Bowel: A rectal tube is in place. No bowel obstruction. The appendix is unremarkable as visualized. Vascular/Lymphatic: The abdominal aorta and IVC are grossly unremarkable. Right femoral approach venous line with tip in the IVC inferior  to the renal vein. Left femoral approach venous line with tip in the left common iliac vein. Right femoral arterial line with tip in the right external iliac artery. No portal venous gas. No adenopathy. Reproductive: The prostate and seminal vesicles are grossly unremarkable. Other: Diffuse subcutaneous edema and anasarca. Musculoskeletal: No acute or significant osseous findings. IMPRESSION: 1. Impacted bilateral lower lobe bronchi with large areas of consolidation involving the lower lobes with near complete consolidation the right lower lobe, new since the prior CT. Findings likely represent postobstructive atelectasis or pneumonia. Aspiration is not excluded. Small bilateral pleural effusions also new since the prior CT. 2. Small ascites and anasarca. 3. Aortic Atherosclerosis (ICD10-I70.0). Electronically Signed   By: Anner Crete M.D.   On: 06/04/2021 00:07     Medications:     prismasol BGK 4/2.5 300 mL/hr at 06/04/21 1658    prismasol BGK 4/2.5 300 mL/hr at 06/04/21 1657   amiodarone 60 mg/hr (06/05/21 1100)   Followed by   amiodarone     ampicillin-sulbactam (UNASYN) IV     fentaNYL infusion INTRAVENOUS 100 mcg/hr (06/05/21 0900)   norepinephrine (LEVOPHED) Adult infusion 16.96 mcg/min (06/05/21 0700)   prismasol  BGK 2/2.5 dialysis solution Stopped (06/03/21 1756)   prismasol BGK 2/2.5 replacement solution Stopped (06/03/21 1756)   prismasol BGK 2/2.5 replacement solution Stopped (06/03/21 1756)   prismasol BGK 4/2.5 1,500 mL/hr at 06/05/21 0736   propofol (DIPRIVAN) infusion 15 mcg/kg/min (06/05/21 0900)   sodium phosphate  Dextrose 5% IVPB 30 mmol (06/05/21 1119)   vasopressin 0.03 Units/min (06/05/21 0900)    sodium chloride   Intravenous Once   albuterol  2.5 mg Nebulization Q6H   chlorhexidine gluconate (MEDLINE KIT)  15 mL Mouth Rinse BID   Chlorhexidine Gluconate Cloth  6 each Topical Daily   docusate  100 mg Per Tube BID   hydrocortisone sod succinate (SOLU-CORTEF) inj  50 mg Intravenous Q6H   lidocaine  1 patch Transdermal Q24H   mouth rinse  15 mL Mouth Rinse 10 times per day   metoCLOPramide (REGLAN) injection  5 mg Intravenous Q8H   multivitamin  1 tablet Per Tube QHS   nutrition supplement (JUVEN)  1 packet Per Tube BID BM   pantoprazole  40 mg Oral Daily   polyethylene glycol  17 g Per Tube Daily   fentaNYL, guaiFENesin-dextromethorphan, heparin, ipratropium-albuterol, lip balm, ondansetron **OR** ondansetron (ZOFRAN) IV, oxyCODONE, polyvinyl alcohol, sodium chloride  Assessment/ Plan:  Mr. RHONE OZAKI is a 26 y.o. black male with hypertension, insulin dependent diabetes mellitus type I, diabetic gastroparesis, diabetic neuropathy, tobacco use, THC use, right toe amputation, perirectal abscess, who is admitted to [facility] on 05/31/2021 for SIRS (systemic inflammatory response syndrome) ([place]) [R65.10] AKI (acute kidney injury) ([place]) [N17.9] Symptomatic anemia [D64.9] Acute renal failure superimposed on stage 5 chronic kidney disease, not on chronic dialysis, unspecified acute renal failure type ([place]) [N17.9, N18.5] Hypertension, unspecified type [I10] Acute cough [R05.1]  Cardiac arrest with code blue 06/01/2021 at 10 am. Transferred to ICU. Intubated  Acute kidney injury on  chronic kidney disease stage IV versus progression of chronic kidney disease to end stage renal disease. Baseline creatinine of 4.13, GFR of 19 on 08/26/20. History of nephrotic range proteinuria and hematuria. No history of renal biopsy. Strong family history of dialysis with mother with ESRD before passing.  - remains hemodynamically unstable.  - Continue CVVHDF Pre filter and post filter replacement 300 ml/hr Dialysate 1500 ml/hr Net Fluid removal rate 0 ml/hr  Acute respiratory failure Vent assisted, suspected to have aspirated Extubated then re-intubated 06/03/2021  Hypotension with cardiogenic shock - requiring vasopressors.  4. Insulin dependent Diabetes type 1 with CKD and proteinuria   Lab Results  Component Value Date   HGBA1C 5.7 (H) 05/31/2021    5. Thrombocytopenia Hematology evaluation on 1/22 Suspected peripheral destruction Work up in progress      LOS: [place] 1/23/202311:54 AM

## 2021-06-05 NOTE — Progress Notes (Signed)
CRRT stopped, pressure alarms. Will re-start initiating CRRT.

## 2021-06-05 NOTE — Progress Notes (Signed)
NP [place] into assess patient. Heart rate up to 180's irregular. Orders to continue to assess and call if patients heart rate sustains. No additional orders at this time.

## 2021-06-05 NOTE — Progress Notes (Signed)
SUBJECTIVE: Jonathan Mcmillan is a 26 y.o. male with medical history including diabetes, CKD, HTN, nicotine dependence admitted on 05/31/2021 with AKI on CKD, anemia, and fevers/chills/myalgias . Patient suffered cardiac arrest on the morning of 1/19 and was ultimately intubated and transferred to the ICU.  Patient sedated and intubated.    Vitals:   06/05/21 0630 06/05/21 0700 06/05/21 0749 06/05/21 0800  BP: 125/71 133/75  109/66  Pulse: (!) 112 (!) 109  (!) 105  Resp:      Temp: 99.9 F (37.7 C) 99.3 F (37.4 C)  98.1 F (36.7 C)  TempSrc:      SpO2: 100% 100% 100%   Weight:      Height:        Intake/Output Summary (Last 24 hours) at 06/05/2021 0841 Last data filed at 06/05/2021 0800 Gross per 24 hour  Intake 2292.93 ml  Output 2275 ml  Net 17.93 ml    LABS: Basic Metabolic Panel: Recent Labs    06/04/21 0234 06/04/21 0508 06/04/21 1611 06/05/21 0410  NA  --    < > 134* 133*  K  --    < > 3.4* 3.5  CL  --    < > 103 104  CO2  --    < > 23 22  GLUCOSE  --    < > 193* 182*  BUN  --    < > 15 17  CREATININE  --    < > 1.52* 1.42*  CALCIUM  --    < > 7.7* 7.2*  MG 1.9  --   --  2.2  PHOS  --    < > 1.6* 1.7*   < > = values in this interval not displayed.   Liver Function Tests: Recent Labs    06/03/21 1416 06/04/21 0016 06/04/21 0508 06/04/21 1611 06/05/21 0410  AST 21 18  --   --   --   ALT 42 39  --   --   --   ALKPHOS 91 89  --   --   --   BILITOT 2.2* 2.3*  --   --   --   PROT 4.4* 4.6*  --   --   --   ALBUMIN 1.7*   1.7* 1.8*   < > 1.7* 1.7*   < > = values in this interval not displayed.   No results for input(s): LIPASE, AMYLASE in the last 72 hours. CBC: Recent Labs    06/04/21 2308 06/05/21 0417  WBC 29.3* 30.3*  NEUTROABS 21.9* 26.2*  HGB 8.7* 8.3*  HCT 24.8* 23.8*  MCV 86.1 86.9  PLT 29* 15*   Cardiac Enzymes: No results for input(s): CKTOTAL, CKMB, CKMBINDEX, TROPONINI in the last 72 hours. BNP: Invalid input(s):  POCBNP D-Dimer: Recent Labs    06/04/21 0234  DDIMER 14.96*   Hemoglobin A1C: No results for input(s): HGBA1C in the last 72 hours. Fasting Lipid Panel: Recent Labs    06/04/21 0508  TRIG 208*   Thyroid Function Tests: No results for input(s): TSH, T4TOTAL, T3FREE, THYROIDAB in the last 72 hours.  Invalid input(s): FREET3 Anemia Panel: Recent Labs    06/04/21 1254  VITAMINB12 4,519*  FOLATE 12.7  RETICCTPCT 0.5     PHYSICAL EXAM General: Well developed, well nourished, in no acute distress HEENT:  Normocephalic and atramatic Neck:  No JVD.  Lungs: Clear bilaterally to auscultation and percussion. Heart: HRRR . Normal S1 and S2 without gallops or murmurs.  Abdomen:  Bowel sounds are positive, abdomen soft and non-tender  Msk:  Back normal, normal gait. Normal strength and tone for age. Extremities: No clubbing, cyanosis or edema.   Neuro: Alert and oriented X 3. Psych:  Good affect, responds appropriately  TELEMETRY: sinus rhythm, HR 95-120s bpm  ASSESSMENT AND PLAN: Patient experiencing episodes of tachycardia related to septic shock. Unable to use Digoxin due to decreased kidney function. Metoprolol and Cardizem not tolerable due to low b/p.  Patient now in atrial fibrillation with RVR. Amiodarone per protocol added. Will continue to follow.   Principal Problem:   AKI (acute kidney injury) ([place]) Active Problems:   Hypertension   Acute kidney injury superimposed on CKD ([place])   Type 2 diabetes mellitus with hypoglycemia without coma, without long-term current use of insulin ([facility])   Nicotine dependence   Anemia of chronic disease   Hypotension   Fever   Cardiopulmonary arrest ([place])   Pressure injury of skin    Sissi Padia, FNP-C 06/05/2021 8:41 AM

## 2021-06-05 NOTE — Progress Notes (Signed)
Np made aware patients heart rate 160-180 and sustaining. EKG ordered. She will sspeak with Dr. Humphrey Rolls. She will  call back with orders. Coninue to assess.

## 2021-06-05 NOTE — Consult Note (Signed)
Pharmacy Antibiotic Note  Jonathan Mcmillan is a 26 y.o. male admitted on 05/31/2021 with c/f aspiration pneumonia cardiac arrest.  Pharmacy has been consulted for transition of CFP/MTZ > unasyn dosing.  Plan: Zosyn >> Cefepime/MTZ >> Unasyn 3g IV q8h (CRRT dosing)  Height: 5' 7.99" (172.7 cm) Weight: 69 kg (152 lb 1.9 oz) IBW/kg (Calculated) : 68.38  Temp (24hrs), Avg:98.9 F (37.2 C), Min:97.3 F (36.3 C), Max:100.4 F (38 C)  Recent Labs  Lab 06/02/21 0916 06/02/21 1123 06/02/21 1535 06/04/21 0016 06/04/21 0210 06/04/21 0234 06/04/21 0508 06/04/21 0936 06/04/21 1126 06/04/21 1611 06/04/21 2308 06/05/21 0410 06/05/21 0417  WBC  --   --    < > 22.5*  --  21.2*  --  24.1* 25.2*  --  29.3*  --  30.3*  CREATININE  --   --    < > 1.81*  --   --  1.68*  --  1.54* 1.52*  --  1.42*  --   LATICACIDVEN 4.7* 4.3*  --  2.8* 2.8*  --   --  3.3*  --   --   --   --   --    < > = values in this interval not displayed.     Estimated Creatinine Clearance: 75.6 mL/min (A) (by C-G formula based on SCr of 1.42 mg/dL (H)).    No Known Allergies  Antimicrobials this admission: 1/19-20 Vancomycin 1/19 Zosyn >> 1/22        CFP/MTZ (1/22-1/23)                     Unasyn (1/23>>  Microbiology results: 1/20 MRSA PCR: Not Detected 1/19 BCx: no growth 1/19 RespCx: few GNR/GPRs, MOD GPCs all c/w Normal flora. 1/18 & 19 Bcx NGTD 1/18 RVP negative 1/18 GIP & CDIFF neg/neg 1/18 Cov/flu - neg  Thank you for allowing pharmacy to be a part of this patients care.  Lorna Dibble, PharmD, [facility] Clinical Pharmacist 06/05/2021 11:21 AM

## 2021-06-05 NOTE — Progress Notes (Signed)
CRRT restarted continue to assess.

## 2021-06-05 NOTE — Progress Notes (Signed)
Nutrition Follow Up Note   DOCUMENTATION CODES:   Not applicable  INTERVENTION:   Once appropriate for tube feeds, recommend:  Vital 1.5@50ml /hr- Initiate at 42m/hr and increase by 169mhr q 8 hours until goal rate is reached.   Pro-Source 4562mID via tube, provides 40kcal and 11g of protein per serving   Free water flushes 69m12m hours to maintain tube patency   Regimen provides 1920kcal/day, 114g/day protein and 1097ml10m free water.   Rena- vit daily via tube   Juven Fruit Punch BID via tube, each serving provides 95kcal and 2.5g of protein (amino acids glutamine and arginine)  NUTRITION DIAGNOSIS:   Inadequate oral intake related to inability to eat (pt sedated and ventilated) as evidenced by NPO status.  GOAL:   Provide needs based on ASPEN/SCCM guidelines -not met   MONITOR:   Vent status, Labs, Weight trends, TF tolerance, Skin, I & O's  ASSESSMENT:   25 y.30 male with medical history significant for type 1 diabetes mellitus, stage IV chronic kidney disease, hypertension and nicotine dependence who presents to the emergency room for evaluation of a 3-day history of fever, chills, nonproductive cough, myalgias and diarrhea. Pt found to have SIRS, AKI and anemia. Pt noted to have new Afib with RVR.   Pt extubated 1/21 and then re-intubated later that night for respiratory distress from suspected aspiration. Tube feeds were held upon re-intubation as pt with increasing pressor requirements  at that time. Pt remains on pressors today but requirements are decreasing. OGT in place to LIS. MD would like to hold on tube feeds today; will plan to re-assess tomorrow. Will start reglan today as pt with h/o DM with concerns for possible gastroparesis and aspiration. Pt remains on CRRT. Per chart, pt up ~7lbs from his admit weight. Pt +391ml 55mis I & Os.   Medications reviewed and include: colace, solu-cortef, reglan, ren-vit, juven, protonix, miralax, unasyn, levophed,  vasopressin   Labs reviewed: Na 133(L), K 3.5 wnl, creat 1.42(H), P 1.7(L), Mg 2.2 wnl Wbc- 30.3(H), Hgb 8.3(L), Hct 23.8(L) Cbgs- 177, 147, 152 x 24 hrs  Patient is currently intubated on ventilator support MV: 9.0 L/min Temp (24hrs), Avg:98.9 F (37.2 C), Min:97.5 F (36.4 C), Max:100.4 F (38 C)  Propofol: 6.54 ml/hr- provides 172kcal/day   MAP- >65mmHg36miet Order:   Diet Order             Diet NPO time specified  Diet effective now                  EDUCATION NEEDS:   No education needs have been identified at this time  Skin:  Skin Assessment: Reviewed RN Assessment (L and R heel wounds)  Last BM:  1/23- type 7  Height:   Ht Readings from Last 1 Encounters:  06/03/21 5' 7.99" (1.727 m)    Weight:   Wt Readings from Last 1 Encounters:  06/05/21 69 kg    Ideal Body Weight:  70 kg  BMI:  Body mass index is 23.13 kg/m.  Estimated Nutritional Needs:   Kcal:  1988kcal/day  Protein:  100-115g/day  Fluid:  2.0-2.3L/day  Kishia Shackett CKoleen Distance, LDN Please [facility] and/or RD on-call/weekend/after hours pager

## 2021-06-06 LAB — ECHOCARDIOGRAM LIMITED
AR max vel: 2.38 cm2
AV Area VTI: 2.9 cm2
AV Area mean vel: 2.5 cm2
AV Mean grad: 3.5 mmHg
AV Peak grad: 7.1 mmHg
Ao pk vel: 1.34 m/s
Area-P 1/2: 3.65 cm2
Height: 67.992 in
S' Lateral: 4 cm
Weight: 2433.88 oz

## 2021-06-06 LAB — IMMATURE PLATELET FRACTION: Immature Platelet Fraction: 36.2 % — ABNORMAL HIGH (ref 1.2–8.6)

## 2021-06-06 LAB — CBC
HCT: 22.6 % — ABNORMAL LOW (ref 39.0–52.0)
Hemoglobin: 7.9 g/dL — ABNORMAL LOW (ref 13.0–17.0)
MCH: 30.2 pg (ref 26.0–34.0)
MCHC: 35 g/dL (ref 30.0–36.0)
MCV: 86.3 fL (ref 80.0–100.0)
Platelets: 11 10*3/uL — CL (ref 150–400)
RBC: 2.62 MIL/uL — ABNORMAL LOW (ref 4.22–5.81)
RDW: 14.9 % (ref 11.5–15.5)
WBC: 32.5 10*3/uL — ABNORMAL HIGH (ref 4.0–10.5)
nRBC: 0 % (ref 0.0–0.2)

## 2021-06-06 LAB — BLOOD GAS, ARTERIAL
Acid-base deficit: 0.3 mmol/L (ref 0.0–2.0)
Bicarbonate: 25.4 mmol/L (ref 20.0–28.0)
FIO2: 30
MECHVT: 450 mL
O2 Saturation: 98.6 %
PEEP: 5 cmH2O
Patient temperature: 37
RATE: 20 resp/min
pCO2 arterial: 46 mmHg (ref 32.0–48.0)
pH, Arterial: 7.35 (ref 7.350–7.450)
pO2, Arterial: 124 mmHg — ABNORMAL HIGH (ref 83.0–108.0)

## 2021-06-06 LAB — RENAL FUNCTION PANEL
Albumin: 1.7 g/dL — ABNORMAL LOW (ref 3.5–5.0)
Albumin: 1.6 g/dL — ABNORMAL LOW (ref 3.5–5.0)
Anion gap: 8 (ref 5–15)
Anion gap: 8 (ref 5–15)
BUN: 19 mg/dL (ref 6–20)
BUN: 18 mg/dL (ref 6–20)
CO2: 25 mmol/L (ref 22–32)
CO2: 25 mmol/L (ref 22–32)
Calcium: 7.4 mg/dL — ABNORMAL LOW (ref 8.9–10.3)
Calcium: 7.3 mg/dL — ABNORMAL LOW (ref 8.9–10.3)
Chloride: 104 mmol/L (ref 98–111)
Chloride: 104 mmol/L (ref 98–111)
Creatinine, Ser: 1.24 mg/dL (ref 0.61–1.24)
Creatinine, Ser: 1.08 mg/dL (ref 0.61–1.24)
GFR, Estimated: >60 mL/min (ref >60)
GFR, Estimated: >60 mL/min (ref >60)
Glucose, Bld: 147 mg/dL — ABNORMAL HIGH (ref 70–99)
Glucose, Bld: 138 mg/dL — ABNORMAL HIGH (ref 70–99)
Phosphorus: 1.6 mg/dL — ABNORMAL LOW (ref 2.5–4.6)
Phosphorus: 1.9 mg/dL — ABNORMAL LOW (ref 2.5–4.6)
Potassium: 3.7 mmol/L (ref 3.5–5.1)
Potassium: 3.7 mmol/L (ref 3.5–5.1)
Sodium: 137 mmol/L (ref 135–145)
Sodium: 137 mmol/L (ref 135–145)

## 2021-06-06 LAB — RETIC PANEL
Immature Retic Fract: 11 % (ref 2.3–15.9)
RBC.: 2.58 MIL/uL — ABNORMAL LOW (ref 4.22–5.81)
Retic Count, Absolute: 11.1 10*3/uL — ABNORMAL LOW (ref 19.0–186.0)
Retic Ct Pct: 0.5 % (ref 0.4–3.1)
Reticulocyte Hemoglobin: 28.9 pg (ref >27.9)

## 2021-06-06 LAB — HEPARIN INDUCED PLATELET AB (HIT ANTIBODY): Heparin Induced Plt Ab: 0.085 OD (ref 0.000–0.400)

## 2021-06-06 LAB — CULTURE, BLOOD (ROUTINE X 2)
Culture: NO GROWTH
Culture: NO GROWTH

## 2021-06-06 LAB — MAGNESIUM: Magnesium: 2.4 mg/dL (ref 1.7–2.4)

## 2021-06-06 LAB — RSV(RESPIRATORY SYNCYTIAL VIRUS) AB, BLOOD: RSV Ab: 1:8 {titer} — ABNORMAL HIGH

## 2021-06-06 LAB — GLUCOSE, CAPILLARY
Glucose-Capillary: 126 mg/dL — ABNORMAL HIGH (ref 70–99)
Glucose-Capillary: 107 mg/dL — ABNORMAL HIGH (ref 70–99)
Glucose-Capillary: 93 mg/dL (ref 70–99)
Glucose-Capillary: 121 mg/dL — ABNORMAL HIGH (ref 70–99)
Glucose-Capillary: 86 mg/dL (ref 70–99)

## 2021-06-06 LAB — SEROTONIN RELEASE ASSAY (SRA)
SRA .2 IU/mL UFH Ser-aCnc: <1 % (ref 0–20)
SRA 100IU/mL UFH Ser-aCnc: <1 % (ref 0–20)

## 2021-06-06 LAB — PROCALCITONIN: Procalcitonin: 39.56 ng/mL

## 2021-06-06 MED ORDER — VITAL 1.5 CAL PO LIQD
1000.0000 mL | ORAL | Status: DC
  Administered 2021-06-06 – 2021-06-07 (×2): 1000 mL

## 2021-06-06 MED ORDER — PHENYLEPHRINE HCL-NACL 20-0.9 MG/250ML-% IV SOLN
0.0000 ug/min | INTRAVENOUS | Status: DC
  Filled 2021-06-06: qty 250

## 2021-06-06 MED ORDER — FREE WATER
30.0000 mL | Status: DC
  Administered 2021-06-06 – 2021-06-08 (×13): 30 mL

## 2021-06-06 MED ORDER — POTASSIUM & SODIUM PHOSPHATES 280-160-250 MG PO PACK
2.0000 | PACK | ORAL | Status: AC
  Administered 2021-06-06 – 2021-06-07 (×3): 2
  Filled 2021-06-06 (×3): qty 2

## 2021-06-06 MED ORDER — PROSOURCE TF PO LIQD
45.0000 mL | Freq: Three times a day (TID) | ORAL | Status: DC
  Administered 2021-06-06 – 2021-06-09 (×6): 45 mL
  Filled 2021-06-06 (×10): qty 45

## 2021-06-06 MED ORDER — PHENYLEPHRINE CONCENTRATED 100MG/250ML (0.4 MG/ML) INFUSION SIMPLE
0.0000 ug/min | INTRAVENOUS | Status: DC
  Administered 2021-06-06: 400 ug/min via INTRAVENOUS
  Administered 2021-06-06: 20 ug/min via INTRAVENOUS
  Administered 2021-06-07 (×2): 400 ug/min via INTRAVENOUS
  Administered 2021-06-07: 250 ug/min via INTRAVENOUS
  Administered 2021-06-07: 350 ug/min via INTRAVENOUS
  Administered 2021-06-08: 160 ug/min via INTRAVENOUS
  Filled 2021-06-06 (×9): qty 250

## 2021-06-06 MED ORDER — POTASSIUM & SODIUM PHOSPHATES 280-160-250 MG PO PACK
2.0000 | PACK | Freq: Three times a day (TID) | ORAL | Status: DC
  Administered 2021-06-06 (×2): 2
  Filled 2021-06-06 (×2): qty 2

## 2021-06-06 MED ORDER — MIDODRINE HCL 5 MG PO TABS
10.0000 mg | ORAL_TABLET | Freq: Three times a day (TID) | ORAL | Status: DC
  Administered 2021-06-06 – 2021-06-10 (×14): 10 mg
  Filled 2021-06-06 (×14): qty 2

## 2021-06-06 MED ORDER — POTASSIUM & SODIUM PHOSPHATES 280-160-250 MG PO PACK: 2.0000 | PACK | Freq: Three times a day (TID) | ORAL | Status: DC

## 2021-06-06 MED ORDER — POTASSIUM & SODIUM PHOSPHATES 280-160-250 MG PO PACK: 2.0000 | PACK | ORAL | Status: DC

## 2021-06-06 NOTE — Progress Notes (Addendum)
NP made aware of patient hypotension despite being maxed out on Neo & Vaso. I was informed with ESRD, his map goal is >60.  2210: Patient Map <60. Levo restarted

## 2021-06-06 NOTE — Progress Notes (Signed)
[facility]  ROUNDING NOTE   Subjective:   Patient was extubated on 1/21 but had to be re-intubated for respiratory distress; suspected to have aspirated Neuro- sedated with propofol, fentanyl Pulm - vent assisted; fio2 30% Cvs: pressors - nor epi, vasopressin, A-line Renal- low UOP; crrt continued   Objective:  Vital signs in last 24 hours:  Temp:  [95.2 F (35.1 C)-99.1 F (37.3 C)] 95.2 F (35.1 C) (01/24 0900) Pulse Rate:  [25-135] 32 (01/24 0615) Resp:  [20] 20 (01/24 0900) BP: (90-176)/(62-127) 113/89 (01/24 0900) SpO2:  [99 %-100 %] 100 % (01/24 0900) Arterial Line BP: (95-195)/(39-97) 129/72 (01/24 0900) FiO2 (%):  [30 %] 30 % (01/24 0900) Weight:  [68 kg] 68 kg (01/24 0417)  Weight change: -1 kg Filed Weights   06/04/21 0423 06/05/21 0423 06/06/21 0417  Weight: 67.9 kg 69 kg 68 kg    Intake/Output: I/O last 3 completed shifts: In: 3235.7 [I.V.:2115.8; Blood:196.3; Other:15; IV Piggyback:908.7] Out: 2919 [Urine:10; Emesis/NG output:100; TYOMA:0045; Stool:180]   Intake/Output this shift:  Total I/O In: 128.7 [I.V.:128.7] Out: 126 [Other:126]  Physical Exam: General: Critically ill  Eyes: Anicteric  Lungs:  Vent assisted  Heart:  Tachycardic, irregular  Abdomen:  Soft,    Extremities: + peripheral edema.  Neurologic: Intubated.    Skin: No acute lesions  Access:  Right femoral temp HD catheter 06/01/21  Rectal tube, Foley in place  Basic Metabolic Panel: Recent Labs  Lab 06/02/21 0357 06/02/21 1535 06/03/21 0307 06/03/21 0500 06/04/21 0234 06/04/21 0508 06/04/21 1126 06/04/21 1611 06/05/21 0410 06/05/21 1610 06/06/21 0420  NA 130*   < >  --    < >  --  134* 133* 134* 133* 133* 137  K 3.1*   < >  --    < >  --  3.1* 3.2* 3.4* 3.5 3.6 3.7  CL 99   < >  --    < >  --  101 103 103 104 101 104  CO2 19*   < >  --    < >  --  23 22 23 22  21* 25  GLUCOSE 95   < >  --    < >  --  154* 175* 193* 182* 203* 147*  BUN 58*   < >  --    < >   --  15 15 15 17 19 19   CREATININE 7.70*   < >  --    < >  --  1.68* 1.54* 1.52* 1.42* 1.41* 1.24  CALCIUM 6.6*   < >  --    < >  --  7.9* 7.9* 7.7* 7.2* 7.1* 7.4*  MG 1.6*  --  1.8  --  1.9  --   --   --  2.2  --  2.4  PHOS 5.8*   < >  --    < >  --  1.5*  --  1.6* 1.7* 3.1 1.6*   < > = values in this interval not displayed.     Liver Function Tests: Recent Labs  Lab 05/31/21 0835 06/01/21 0404 06/01/21 1325 06/01/21 1937 06/03/21 1416 06/04/21 0016 06/04/21 0508 06/04/21 1611 06/05/21 0410 06/05/21 1610 06/06/21 0420  AST 24  --  101*  --  21 18  --   --   --   --   --   ALT 33  --  78*  --  42 39  --   --   --   --   --  ALKPHOS 70  --  68  --  91 89  --   --   --   --   --   BILITOT 0.6  --  1.1  --  2.2* 2.3*  --   --   --   --   --   PROT 6.3*  --  4.7*  --  4.4* 4.6*  --   --   --   --   --   ALBUMIN 2.9*   < > 2.0*   < > 1.7*   1.7* 1.8* 1.8* 1.7* 1.7* 1.7* 1.7*   < > = values in this interval not displayed.    Recent Labs  Lab 05/31/21 0835  LIPASE 29    Recent Labs  Lab 06/04/21 0016  AMMONIA 23     CBC: Recent Labs  Lab 05/31/21 0835 06/01/21 0404 06/04/21 0936 06/04/21 1126 06/04/21 2308 06/05/21 0417 06/06/21 0423  WBC 12.7*   < > 24.1* 25.2* 29.3* 30.3* 32.5*  NEUTROABS 11.6*  --  19.3*  --  21.9* 26.2*  --   HGB 5.2*   < > 8.9* 8.8* 8.7* 8.3* 7.9*  HCT 15.8*   < > 25.4* 24.6* 24.8* 23.8* 22.6*  MCV 89.8   < > 84.9 84.8 86.1 86.9 86.3  PLT 172   < > 10* 10* 29* 15* 11*   < > = values in this interval not displayed.     Cardiac Enzymes: Recent Labs  Lab 05/31/21 1114  CKTOTAL 411*     BNP: Invalid input(s): POCBNP  CBG: Recent Labs  Lab 06/05/21 1604 06/05/21 1936 06/05/21 2312 06/06/21 0404 06/06/21 0720  GLUCAP 177* 171* 162* 126* 107*     Microbiology: Results for orders placed or performed during the hospital encounter of 05/31/21  Resp Panel by RT-PCR (Flu A&B, Covid) Nasopharyngeal Swab     Status: None    Collection Time: 05/31/21  6:58 AM   Specimen: Nasopharyngeal Swab; Nasopharyngeal(NP) swabs in vial transport medium  Result Value Ref Range Status   SARS Coronavirus 2 by RT PCR NEGATIVE NEGATIVE Final    Comment: (NOTE) SARS-CoV-2 target nucleic acids are NOT DETECTED.  The SARS-CoV-2 RNA is generally detectable in upper respiratory specimens during the acute phase of infection. The lowest concentration of SARS-CoV-2 viral copies this assay can detect is 138 copies/mL. A negative result does not preclude SARS-Cov-2 infection and should not be used as the sole basis for treatment or other patient management decisions. A negative result may occur with  improper specimen collection/handling, submission of specimen other than nasopharyngeal swab, presence of viral mutation(s) within the areas targeted by this assay, and inadequate number of viral copies(<138 copies/mL). A negative result must be combined with clinical observations, patient history, and epidemiological information. The expected result is Negative.  Fact Sheet for Patients:  EntrepreneurPulse.com.au  Fact Sheet for Healthcare Providers:  IncredibleEmployment.be  This test is no t yet approved or cleared by the [place] FDA and  has been authorized for detection and/or diagnosis of SARS-CoV-2 by FDA under an Emergency Use Authorization (EUA). This EUA will remain  in effect (meaning this test can be used) for the duration of the COVID-19 declaration under Section 564(b)(1) of the Act, 21 U.S.C.section 360bbb-3(b)(1), unless the authorization is terminated  or revoked sooner.       Influenza A by PCR NEGATIVE NEGATIVE Final   Influenza B by PCR NEGATIVE NEGATIVE Final    Comment: (NOTE) The Xpert Xpress  SARS-CoV-2/FLU/RSV plus assay is intended as an aid in the diagnosis of influenza from Nasopharyngeal swab specimens and should not be used as a sole basis for treatment.  Nasal washings and aspirates are unacceptable for Xpert Xpress SARS-CoV-2/FLU/RSV testing.  Fact Sheet for Patients: EntrepreneurPulse.com.au  Fact Sheet for Healthcare Providers: IncredibleEmployment.be  This test is not yet approved or cleared by the [place] FDA and has been authorized for detection and/or diagnosis of SARS-CoV-2 by FDA under an Emergency Use Authorization (EUA). This EUA will remain in effect (meaning this test can be used) for the duration of the COVID-19 declaration under Section 564(b)(1) of the Act, 21 U.S.C. section 360bbb-3(b)(1), unless the authorization is terminated or revoked.  Performed at [facility], [place], [place] [address]   Respiratory (~20 pathogens) panel by PCR     Status: None   Collection Time: 05/31/21  6:58 AM   Specimen: Nasopharyngeal Swab; Respiratory  Result Value Ref Range Status   Adenovirus NOT DETECTED NOT DETECTED Final   Coronavirus 229E NOT DETECTED NOT DETECTED Final    Comment: (NOTE) The Coronavirus on the Respiratory Panel, DOES NOT test for the novel  Coronavirus (2019 nCoV)    Coronavirus HKU1 NOT DETECTED NOT DETECTED Final   Coronavirus NL63 NOT DETECTED NOT DETECTED Final   Coronavirus OC43 NOT DETECTED NOT DETECTED Final   Metapneumovirus NOT DETECTED NOT DETECTED Final   Rhinovirus / Enterovirus NOT DETECTED NOT DETECTED Final   Influenza A NOT DETECTED NOT DETECTED Final   Influenza B NOT DETECTED NOT DETECTED Final   Parainfluenza Virus 1 NOT DETECTED NOT DETECTED Final   Parainfluenza Virus 2 NOT DETECTED NOT DETECTED Final   Parainfluenza Virus 3 NOT DETECTED NOT DETECTED Final   Parainfluenza Virus 4 NOT DETECTED NOT DETECTED Final   Respiratory Syncytial Virus NOT DETECTED NOT DETECTED Final   Bordetella pertussis NOT DETECTED NOT DETECTED Final   Bordetella Parapertussis NOT DETECTED NOT DETECTED Final   Chlamydophila pneumoniae NOT  DETECTED NOT DETECTED Final   Mycoplasma pneumoniae NOT DETECTED NOT DETECTED Final    Comment: Performed at [facility], [place]. [address], [place], [place] [address]  Group A Strep by PCR [facility] Only)     Status: None   Collection Time: 05/31/21  8:35 AM   Specimen: Throat; Sterile Swab  Result Value Ref Range Status   Group A Strep by PCR NOT DETECTED NOT DETECTED Final    Comment: Performed at [facility], [address] [place], [place] [address]  Blood culture (routine x 2)     Status: None   Collection Time: 05/31/21  8:39 AM   Specimen: BLOOD  Result Value Ref Range Status   Specimen Description BLOOD BLOOD RIGHT FOREARM  Final   Special Requests   Final    BOTTLES DRAWN AEROBIC AND ANAEROBIC Blood Culture results may not be optimal due to an excessive volume of blood received in culture bottles   Culture   Final    NO GROWTH 5 DAYS Performed at [facility], [address], [place], [place] [address]    Report Status 06/05/2021 FINAL  Final  Blood culture (routine x 2)     Status: None   Collection Time: 05/31/21  8:39 AM   Specimen: BLOOD  Result Value Ref Range Status   Specimen Description BLOOD RIGHT ANTECUBITAL  Final   Special Requests   Final    BOTTLES DRAWN AEROBIC AND ANAEROBIC Blood Culture results  may not be optimal due to an excessive volume of blood received in culture bottles   Culture   Final    NO GROWTH 5 DAYS Performed at [facility], [address] [place], [place] [address]    Report Status 06/05/2021 FINAL  Final  C Difficile Quick Screen w PCR reflex     Status: None   Collection Time: 05/31/21  1:20 PM   Specimen: Stool  Result Value Ref Range Status   C Diff antigen NEGATIVE NEGATIVE Final   C Diff toxin NEGATIVE NEGATIVE Final   C Diff interpretation No C. difficile detected.  Final    Comment: Performed at [facility], [address] [place], [place] [address]  Gastrointestinal Panel by PCR ,  Stool     Status: None   Collection Time: 05/31/21  1:30 PM   Specimen: Stool  Result Value Ref Range Status   Campylobacter species NOT DETECTED NOT DETECTED Final   Plesimonas shigelloides NOT DETECTED NOT DETECTED Final   Salmonella species NOT DETECTED NOT DETECTED Final   Yersinia enterocolitica NOT DETECTED NOT DETECTED Final   Vibrio species NOT DETECTED NOT DETECTED Final   Vibrio cholerae NOT DETECTED NOT DETECTED Final   Enteroaggregative E coli (EAEC) NOT DETECTED NOT DETECTED Final   Enteropathogenic E coli (EPEC) NOT DETECTED NOT DETECTED Final   Enterotoxigenic E coli (ETEC) NOT DETECTED NOT DETECTED Final   Shiga like toxin producing E coli (STEC) NOT DETECTED NOT DETECTED Final   Shigella/Enteroinvasive E coli (EIEC) NOT DETECTED NOT DETECTED Final   Cryptosporidium NOT DETECTED NOT DETECTED Final   Cyclospora cayetanensis NOT DETECTED NOT DETECTED Final   Entamoeba histolytica NOT DETECTED NOT DETECTED Final   Giardia lamblia NOT DETECTED NOT DETECTED Final   Adenovirus F40/41 NOT DETECTED NOT DETECTED Final   Astrovirus NOT DETECTED NOT DETECTED Final   Norovirus GI/GII NOT DETECTED NOT DETECTED Final   Rotavirus A NOT DETECTED NOT DETECTED Final   Sapovirus (I, II, IV, and V) NOT DETECTED NOT DETECTED Final    Comment: Performed at [facility], [address], [place], [place] [address]  Urine Culture     Status: None   Collection Time: 06/01/21 10:23 AM   Specimen: In/Out Cath Urine  Result Value Ref Range Status   Specimen Description   Final    IN/OUT CATH URINE Performed at [facility], [address], [place], [place] [address]    Special Requests   Final    NONE Performed at [facility], [address], [place], [place] [address]    Culture   Final    NO GROWTH Performed at [facility], [address], [place], [place] [address]    Report Status 06/03/2021 FINAL  Final  Culture, Respiratory w Gram Stain      Status: None   Collection Time: 06/01/21  3:59 PM   Specimen: Tracheal Aspirate; Respiratory  Result Value Ref Range Status   Specimen Description   Final    TRACHEAL ASPIRATE Performed at [facility], [address], [place], [place] [address]    Special Requests   Final    NONE Performed at [facility], [address] [place], [place] [address]    Gram Stain   Final    RARE SQUAMOUS EPITHELIAL CELLS PRESENT MODERATE WBC PRESENT,BOTH PMN AND MONONUCLEAR MODERATE GRAM POSITIVE COCCI FEW GRAM NEGATIVE RODS FEW GRAM POSITIVE RODS RARE YEAST    Culture  Final    FEW Consistent with normal respiratory flora. No Pseudomonas species isolated Performed at [facility] [address], [place], [place] [address]    Report Status 06/03/2021 FINAL  Final  CULTURE, BLOOD (ROUTINE X 2) w Reflex to ID Panel     Status: None   Collection Time: 06/01/21  7:06 PM   Specimen: BLOOD  Result Value Ref Range Status   Specimen Description BLOOD RIGHT ANTECUBITAL  Final   Special Requests   Final    BOTTLES DRAWN AEROBIC ONLY Blood Culture results may not be optimal due to an inadequate volume of blood received in culture bottles   Culture   Final    NO GROWTH 5 DAYS Performed at [facility], [address] [place], [place] [address]    Report Status 06/06/2021 FINAL  Final  CULTURE, BLOOD (ROUTINE X 2) w Reflex to ID Panel     Status: None   Collection Time: 06/01/21  7:37 PM   Specimen: BLOOD  Result Value Ref Range Status   Specimen Description BLOOD [facility]  Final   Special Requests BOTTLES DRAWN AEROBIC AND ANAEROBIC BCLV  Final   Culture   Final    NO GROWTH 5 DAYS Performed at [facility], [address] [place], [place] [address]    Report Status 06/06/2021 FINAL  Final  MRSA Next Gen by PCR, Nasal     Status: None   Collection Time: 06/02/21 10:46 AM   Specimen: Nasal Mucosa; Nasal Swab  Result Value Ref Range Status   MRSA by  PCR Next Gen NOT DETECTED NOT DETECTED Final    Comment: (NOTE) The GeneXpert MRSA Assay (FDA approved for NASAL specimens only), is one component of a comprehensive MRSA colonization surveillance program. It is not intended to diagnose MRSA infection nor to guide or monitor treatment for MRSA infections. Test performance is not FDA approved in patients less than 60 years old. Performed at [facility], [address] [place], [place] [address]   Culture, Respiratory w Gram Stain     Status: None (Preliminary result)   Collection Time: 06/04/21  7:38 AM   Specimen: Tracheal Aspirate; Respiratory  Result Value Ref Range Status   Specimen Description   Final    TRACHEAL ASPIRATE Performed at [facility], [address], [place], [place] [address]    Special Requests   Final    NONE Performed at [facility], [address] [place], [place] [address]    Gram Stain   Final    MODERATE WBC PRESENT,BOTH PMN AND MONONUCLEAR NO ORGANISMS SEEN    Culture   Final    FEW YEAST RARE GRAM NEGATIVE RODS IDENTIFICATION TO FOLLOW Performed at [facility], [place] [address] [place], [place] [address]    Report Status PENDING  Incomplete  CULTURE, BLOOD (ROUTINE X 2) w Reflex to ID Panel     Status: None (Preliminary result)   Collection Time: 06/04/21  6:35 PM   Specimen: BLOOD  Result Value Ref Range Status   Specimen Description BLOOD BLOOD LEFT HAND  Final   Special Requests   Final    BOTTLES DRAWN AEROBIC AND ANAEROBIC Blood Culture adequate volume   Culture   Final    NO GROWTH 2 DAYS Performed at [facility], [address], [place],  [address]    Report Status PENDING  Incomplete    Coagulation Studies: Recent Labs  06/04/21 0234  LABPROT 16.6*  INR 1.3*     Urinalysis: No results for input(s): COLORURINE, LABSPEC, PHURINE, GLUCOSEU, HGBUR, BILIRUBINUR, KETONESUR, PROTEINUR, UROBILINOGEN, NITRITE, LEUKOCYTESUR  in the last 72 hours.  Invalid input(s): APPERANCEUR     Imaging: No results found.   Medications:     prismasol BGK 4/2.5 300 mL/hr at 06/06/21 0330    prismasol BGK 4/2.5 300 mL/hr at 06/06/21 0330   amiodarone 30 mg/hr (06/06/21 0900)   ampicillin-sulbactam (UNASYN) IV Stopped (06/06/21 0536)   fentaNYL infusion INTRAVENOUS 150 mcg/hr (06/06/21 0900)   norepinephrine (LEVOPHED) Adult infusion 12 mcg/min (06/06/21 0900)   prismasol BGK 2/2.5 dialysis solution Stopped (06/03/21 1756)   prismasol BGK 2/2.5 replacement solution Stopped (06/03/21 1756)   prismasol BGK 2/2.5 replacement solution Stopped (06/03/21 1756)   prismasol BGK 4/2.5 1,500 mL/hr at 06/06/21 0753   propofol (DIPRIVAN) infusion 30 mcg/kg/min (06/06/21 0900)   vasopressin 0.03 Units/min (06/06/21 0900)    sodium chloride   Intravenous Once   albuterol  2.5 mg Nebulization Q6H   chlorhexidine gluconate (MEDLINE KIT)  15 mL Mouth Rinse BID   Chlorhexidine Gluconate Cloth  6 each Topical Daily   docusate  100 mg Per Tube BID   hydrocortisone sod succinate (SOLU-CORTEF) inj  50 mg Intravenous Q6H   insulin aspart  3-9 Units Subcutaneous Q4H   lidocaine  1 patch Transdermal Q24H   mouth rinse  15 mL Mouth Rinse 10 times per day   metoCLOPramide (REGLAN) injection  5 mg Intravenous Q8H   multivitamin  1 tablet Per Tube QHS   nutrition supplement (JUVEN)  1 packet Per Tube BID BM   pantoprazole  40 mg Oral Daily   polyethylene glycol  17 g Per Tube Daily   potassium & sodium phosphates  2 packet Per Tube TID   fentaNYL, guaiFENesin-dextromethorphan, heparin, ipratropium-albuterol, lip balm, ondansetron **OR** ondansetron (ZOFRAN) IV, oxyCODONE, polyvinyl alcohol, sodium chloride  Assessment/ Plan:  [name] is a 26 y.o. black male with hypertension, insulin dependent diabetes mellitus type I, diabetic gastroparesis, diabetic neuropathy, tobacco use, THC use, right toe amputation, perirectal abscess,  who is admitted to [facility] on 05/31/2021 for SIRS (systemic inflammatory response syndrome) ([place]) [R65.10] AKI (acute kidney injury) ([place]) [N17.9] Symptomatic anemia [D64.9] Acute renal failure superimposed on stage 5 chronic kidney disease, not on chronic dialysis, unspecified acute renal failure type ([place]) [N17.9, N18.5] Hypertension, unspecified type [I10] Acute cough [R05.1]  Cardiac arrest with code blue 06/01/2021 at 10 am. Transferred to ICU. Intubated  Acute kidney injury on chronic kidney disease stage IV versus progression of chronic kidney disease to end stage renal disease. Baseline creatinine of 4.13, GFR of 19 on 08/26/20. History of nephrotic range proteinuria and hematuria. No history of renal biopsy. Strong family history of dialysis with mother with ESRD before passing.  - remains hemodynamically unstable.  - Continue CVVHDF Pre filter and post filter replacement 300 ml/hr Dialysate 1500 ml/hr Net Fluid removal rate 50 ml/hr    Acute respiratory failure Vent assisted, suspected to have aspirated Extubated then re-intubated 06/03/2021  Hypotension with cardiogenic shock - requiring vasopressors.   4. Insulin dependent Diabetes type 1 with CKD and proteinuria   Lab Results  Component Value Date   HGBA1C 5.7 (H) 05/31/2021    5. Thrombocytopenia Hematology evaluation on 1/22 Suspected peripheral destruction Work up in progress      LOS: [place] 1/24/20239:50 AM

## 2021-06-06 NOTE — Progress Notes (Signed)
SUBJECTIVE: Jonathan Mcmillan is a 26 y.o. male with medical history including diabetes, CKD, HTN, nicotine dependence admitted on 05/31/2021 with AKI on CKD, anemia, and fevers/chills/myalgias . Patient suffered cardiac arrest on the morning of 1/19 and was ultimately intubated and transferred to the ICU.   Patient sedated and intubated.    Vitals:   06/06/21 0630 06/06/21 0700 06/06/21 0723 06/06/21 0800  BP: (!) 110/93 138/76  (!) 130/93  Pulse:      Resp:  20  20  Temp: 98.1 F (36.7 C) (!) 97.5 F (36.4 C)  (!) 96.4 F (35.8 C)  TempSrc:  Esophageal  Esophageal  SpO2:  100% 99% 99%  Weight:      Height:        Intake/Output Summary (Last 24 hours) at 06/06/2021 0836 Last data filed at 06/06/2021 0800 [place] per 24 hour  Intake 2166.83 ml  Output 2138 ml  Net 28.83 ml    LABS: Basic Metabolic Panel: Recent Labs    06/05/21 0410 06/05/21 1610 06/06/21 0420  NA 133* 133* 137  K 3.5 3.6 3.7  CL 104 101 104  CO2 22 21* 25  GLUCOSE 182* 203* 147*  BUN 17 19 19   CREATININE 1.42* 1.41* 1.24  CALCIUM 7.2* 7.1* 7.4*  MG 2.2  --  2.4  PHOS 1.7* 3.1 1.6*   Liver Function Tests: Recent Labs    06/03/21 1416 06/04/21 0016 06/04/21 0508 06/05/21 1610 06/06/21 0420  AST 21 18  --   --   --   ALT 42 39  --   --   --   ALKPHOS 91 89  --   --   --   BILITOT 2.2* 2.3*  --   --   --   PROT 4.4* 4.6*  --   --   --   ALBUMIN 1.7*   1.7* 1.8*   < > 1.7* 1.7*   < > = values in this interval not displayed.   No results for input(s): LIPASE, AMYLASE in the last 72 hours. CBC: Recent Labs    06/04/21 2308 06/05/21 0417 06/06/21 0423  WBC 29.3* 30.3* 32.5*  NEUTROABS 21.9* 26.2*  --   HGB 8.7* 8.3* 7.9*  HCT 24.8* 23.8* 22.6*  MCV 86.1 86.9 86.3  PLT 29* 15* 11*   Cardiac Enzymes: No results for input(s): CKTOTAL, CKMB, CKMBINDEX, TROPONINI in the last 72 hours. BNP: Invalid input(s): POCBNP D-Dimer: Recent Labs    06/04/21 0234  DDIMER 14.96*   Hemoglobin  A1C: No results for input(s): HGBA1C in the last 72 hours. Fasting Lipid Panel: Recent Labs    06/04/21 0508  TRIG 208*   Thyroid Function Tests: No results for input(s): TSH, T4TOTAL, T3FREE, THYROIDAB in the last 72 hours.  Invalid input(s): FREET3 Anemia Panel: Recent Labs    06/04/21 1254  VITAMINB12 4,519*  FOLATE 12.7  RETICCTPCT 0.5     PHYSICAL EXAM General: critically ill HEENT:  Normocephalic and atramatic Neck:  No JVD.  Lungs: vent assited Heart: atrial fibrillation with RVR Abdomen:  soft  Extremities: peripheral edema   Neuro: intubated   TELEMETRY: atrial fibrillation, HR 110s-120s  ASSESSMENT AND PLAN: Patient cardiac arrest likely due to asystole secondary to metabolic disorder, Mildly elevated troponin levels due to demand ischemia.   Echo performed 06/01/21 revealed EF 35-40%. Mild LV dysfunction likely non-cardiac, age of patient most likely not CAD.   06/05/21 Patient went in to atrial fibrillation with RVR. Amiodarone drip per protocol  started. Patient heart rate improved today, 100s-120s, continues to be in atrial fibrillation. Will continue to follow.  Principal Problem:   AKI (acute kidney injury) ([place]) Active Problems:   Hypertension   Acute kidney injury superimposed on CKD ([place])   Type 2 diabetes mellitus with hypoglycemia without coma, without long-term current use of insulin (HCC)   Nicotine dependence   Anemia of chronic disease   Hypotension   Fever   Cardiopulmonary arrest ([place])   Pressure injury of skin   Acute respiratory failure with hypoxia ([place])    Valera Vallas, FNP-C 06/06/2021 8:36 AM

## 2021-06-06 NOTE — Progress Notes (Signed)
RE: [name]  To Whom It May Concern,           This letter is to confirm that Mr, Jonathan Mcmillan is admitted to ICU with ongoing medical issues. He was admitted   on 1/18 and discharge date is TBD.   If there any questions please call [phone].     Corrin Parker, M.D.  [facility]  Medical Director [place] Director [facility] Cardio-Pulmonary Department

## 2021-06-06 NOTE — Consult Note (Signed)
[place] for Electrolyte Monitoring and Replacement   Recent Labs: Potassium (mmol/L)  Date Value  06/06/2021 3.7  08/05/2013 3.8   Magnesium (mg/dL)  Date Value  06/06/2021 2.4   Calcium (mg/dL)  Date Value  06/06/2021 7.3 (L)   Calcium, Total (PTH) (mg/dL)  Date Value  06/12/2020 8.0   Albumin (g/dL)  Date Value  06/06/2021 1.6 (L)  08/05/2013 2.9 (L)   Phosphorus (mg/dL)  Date Value  06/06/2021 1.9 (L)   Sodium (mmol/L)  Date Value  06/06/2021 137  08/05/2013 133 (L)   Assessment: Jonathan Mcmillan is a 26 y.o. male with medical history including diabetes, CKD, HTN, nicotine dependence admitted on 05/31/2021 with  AKI on CKD, anemia, and fevers/chills/myalgias . Patient suffered cardiac arrest on the morning of 1/19 and was ultimately intubated and transferred to the ICU. Patient is currently on CRRT per nephrology. Pharmacy has been consulted to monitor and replace electrolytes.  Phos: 1.7>3.1>1.6 Na: 133>137 K:3.6>3.7   Goal of Therapy:  Electrolytes within normal limits  Plan:  Remains on CRRT (Prismasol 4K/2.5Ca) Potassium correction with CRRT fluids (improved WNL) Phos 1.7>3.1>1.6 > 1.9  Na/K+ WNL. Will dose Phos-NaK 500 TID per tube q4H x  3 doses.  Continue to monitor renal function panels BID while on CRRT  Eleonore Chiquito, PharmD, BCPS.  Clinical Pharmacist 06/06/2021 4:24 PM

## 2021-06-06 NOTE — Progress Notes (Signed)
NAME:  Jonathan Mcmillan, MRN:  [id number], DOB:  1995-07-27, LOS: 6 ADMISSION DATE:  05/31/2021, CONSULTATION DATE:  06/01/2021 REFERRING MD:  Dr. Leslye Peer, CHIEF COMPLAINT:  Cardiac Arrest   Brief Pt Description / Synopsis:  26 y.o. admitted with AKI on CKD in setting of viral syndrome and diarrhea.  Suffered in-hospital cardiac arrest (initial rhythm asystole), suspect due to severe metabolic acidosis and multiple metabolic derangements.  History of Present Illness:  Jonathan Mcmillan is a 26 year old male with a past medical history significant for diabetes mellitus type 1, chronic kidney disease stage IV, hypertension, nicotine dependence who presented to [facility] ED on 05/31/2021 due to 3-day history of fever, chills, nonproductive cough, myalgias, and diarrhea. Patient denied urinary frequency, nocturia, dysuria, abdominal pain, hematemesis, chest pain, shortness of breath, edema.  ED Course: Initial vital signs: Temperature 102.6, RR 22, HR 104, BP 139/70, SpO2 97% on room air Significant labs: Sodium 131, potassium 4.4, chloride 103, bicarb 15, glucose 177, BUN 19, creatinine 13.86 (baseline of 3.92), calcium 6.7, alkaline phosphatase 70, lipase 29, AST 24, ALT 33, lactic acid 2.1, WBC 12.7, hemoglobin 5.2 (baseline of 8.3) hematocrit 15.8, MCV 89.8, RDW 12.8, platelets 172, PT 15.8, INR 1.3. COVID-19 and influenza PCR negative Respiratory viral panel negative Urinalysis with proteinuria Imaging: Chest x-ray negative for acute cardiopulmonary disease CT Chest w/o contrast>>1. Trace pericardial effusion and mild diffuse body wall edema, new since 01/02/2020. 2. Clear lungs, with no focal consolidation or pleural effusion. 3. Hypodense blood pool consistent with anemia. Renal US>>Increased renal echogenicity compatible with medical renal disease. Retroperitoneal perinephric edema suspect related to volume overload.  Patient was admitted by the hospitalist for further work-up and treatment of  acute kidney injury on CKD.  Nephrology was consulted.   Early in the morning on 06/01/2021 rapid response was called when he was found in the shower on a chair passed out. Found to be  hypotensive, hypoglycemic, and lethargic with his "eyes rolling in the back of his head."  Patient was given 500 cc fluid bolus and ordered for blood transfusion.  However CODE BLUE was called as pt was found to be in asystole (progressed to PEA).  He received epinephrine x2, sodium bicarbonate x2, calcium x1, glucose and insulin.  Pulse was regained and he was transferred to ICU.  Upon arrival to the ICU patient patient is critically ill and concern for decorticate posturing.  PCCM consulted for further management.  INTERVAL HISTORY Severe multiorgan failure On pressors severe sCHF Severe renal failure On CRRT Severe thrombocytopenia    Pertinent  Medical History  Chronic Kidney Disease Stage IV Diabetes Mellitus 1  Micro Data:  05/31/2021: SARS-CoV-2 and influenza PCR>> negative 05/31/2021: Respiratory viral panel>> negative 05/31/2021: Group A strep PCR>> not detected 05/31/2021: Blood culture x2>> no growth to date 05/31/2021: GI panel>> negative 05/31/2021: C. Difficile>> negative 06/01/2021: Tracheal aspirate>> gram + cocci, gram + rods, gram - rods,  06/01/2021: Urine>> 06/01/2021: Blood culture>>  Antimicrobials:  Ceftriaxone 1/19 x 1 dose Vancomycin 1/19>> Zosyn 1/19>>  Significant Hospital Events: Including procedures, antibiotic start and stop dates in addition to other pertinent events   1/18: Admitted by the hospitalist.  Nephrology consulted, plans for vascular to place temporary dialysis catheter with initiation of dialysis 1/19: In-hospital cardiac arrest.  Critically ill. Transferred to ICU.  Plan for CRRT.  Central line, A line, and Trialysis catheter placed.  Concern for possible anoxic brain injury.  Sepsis workup in progress 1/20: Pt is awake, tracking, following simple  commands.  Remains on CRRT, bicarb gtt d/c.  Weaning fiO2 and pressors. Tracheal aspirate growing gram + cocci, gram + rods, gram - rods 1/21: Extubated; leukocytosis, thrombocytopenia 1/22: Reintubated overnight; apparent large volume aspiration; worsening shock 1/23 remains with multiorgan failure, plt transfusion 1/24 severe multiorgan failure    Objective   Blood pressure (!) 110/93, pulse (!) 32, temperature 98.1 F (36.7 C), resp. rate (!) 9, height 5' 7.99" (1.727 m), weight 68 kg, SpO2 100 %.    Vent Mode: PRVC FiO2 (%):  [30 %] 30 % Set Rate:  [20 bmp] 20 bmp Vt Set:  [450 mL] 450 mL PEEP:  [5 cmH20] 5 cmH20 Plateau Pressure:  [16 cmH20] 16 cmH20   Intake/Output Summary (Last 24 hours) at 06/06/2021 0717 Last data filed at 06/06/2021 0700 Gross per 24 hour  Intake 2127.76 ml  Output 2121 ml  Net 6.76 ml    Filed Weights   06/04/21 0423 06/05/21 0423 06/06/21 0417  Weight: 67.9 kg 69 kg 68 kg    REVIEW OF SYSTEMS  PATIENT IS UNABLE TO PROVIDE COMPLETE REVIEW OF SYSTEMS DUE TO SEVERE CRITICAL ILLNESS AND TOXIC METABOLIC ENCEPHALOPATHY    PHYSICAL EXAMINATION:  GENERAL:critically ill appearing, +resp distress EYES: Pupils equal, round, reactive to light.  No scleral icterus.  MOUTH: Moist mucosal membrane. INTUBATED NECK: Supple.  PULMONARY: +rhonchi, +wheezing CARDIOVASCULAR: S1 and S2.  No murmurs  GASTROINTESTINAL: Soft, nontender, -distended. Positive bowel sounds.  MUSCULOSKELETAL: No swelling, clubbing, or edema.  NEUROLOGIC: obtunded SKIN:intact,warm,dry     Assessment & Plan:     Admitted to ICU for In-Hospital Cardiac Arrest (initial rhythm asystole), suspect in setting of severe metabolic acidosis and multiple metabolic derangements New systolic heart failure with severe aspiration pneumonia  and multiorgan failure  Severe ACUTE Hypoxic and Hypercapnic Respiratory Failure -continue Mechanical Ventilator support -Wean Fio2 and PEEP as  tolerated -VAP/VENT bundle implementation - Wean PEEP & FiO2 as tolerated, maintain SpO2 > 88% - Head of bed elevated 30 degrees, VAP protocol in place - Plateau pressures less than 30 cm H20  - Intermittent chest x-ray & ABG PRN - Ensure adequate pulmonary hygiene  -will NOT perform SAT/SBT when respiratory parameters are met  SEPTIC shock SOURCE-LUNGS,GI -use vasopressors to keep MAP>65 as needed -follow ABG and LA as needed -follow up cultures -emperic ABX  stress dose steroids  ACUTE SYSTOLIC CARDIAC FAILURE- EF 35% Afib with RVR -follow up cardiac enzymes as indicated -follow up cardiology recs On AMIO infusion   PMHx: Hypertension -Echocardiogram 06/01/21>>LVEF 35-40% and global hypokinesis, grade II diastolic dysfunction, mildly reduced RV function, and moderately increased Pulmonary systolic pressure -Urine drug screen + for Cannabinoid -Cardiology following, appreciate input    Thrombocytopenia (4T score is 3-4) -Dr. Tasia Catchings with Hematology consulted TTP lees likely at this time -Monitor for S/Sx of bleeding -Trend CBC -SCD's for VTE Prophylaxis  -Transfuse for Hgb <7 -Transfuse platelets for platelet count <10K -Timeline does not fit with HIT, 4T score is low to moderate probability given his baseline dialysis requirement and probable heparin exposure -Suspect thrombocytopenia in setting of sepsis, however, will continue to monitor -Sent HIT panel, ADAMTS13, haptoglobin; doubt HUS but on differential -Avoid heparin products   ACUTE KIDNEY INJURY/Renal Failure -continue Foley Catheter-assess need -Avoid nephrotoxic agents -Follow urine output, BMP -Ensure adequate renal perfusion, optimize oxygenation -Renal dose medications On CRRT  Intake/Output Summary (Last 24 hours) at 06/06/2021 0720 Last data filed at 06/06/2021 0700 Gross per 24 hour  Intake 2127.76 ml  Output 2121 ml  Net 6.76 ml     ENDO - ICU hypoglycemic\Hyperglycemia protocol -check FSBS  per protocol   GI GI PROPHYLAXIS as indicated  NUTRITIONAL STATUS DIET-->TF's as tolerated Constipation protocol as indicated   ELECTROLYTES -follow labs as needed -replace as needed -pharmacy consultation and following   Best Practice (right click and "Reselect all SmartList Selections" daily)   Diet/type: NPO DVT prophylaxis: SCD (holding chemical prophylaxis due to thrombocytopenia) GI prophylaxis: PPI Lines: Central line and yes and it is still needed Foley:  Yes, and it is still needed Code Status:  full code Family updated 1/23   Labs   CBC: Recent Labs  Lab 05/31/21 0835 06/01/21 0404 06/04/21 0936 06/04/21 1126 06/04/21 2308 06/05/21 0417 06/06/21 0423  WBC 12.7*   < > 24.1* 25.2* 29.3* 30.3* 32.5*  NEUTROABS 11.6*  --  19.3*  --  21.9* 26.2*  --   HGB 5.2*   < > 8.9* 8.8* 8.7* 8.3* 7.9*  HCT 15.8*   < > 25.4* 24.6* 24.8* 23.8* 22.6*  MCV 89.8   < > 84.9 84.8 86.1 86.9 86.3  PLT 172   < > 10* 10* 29* 15* 11*   < > = values in this interval not displayed.     Basic Metabolic Panel: Recent Labs  Lab 06/02/21 0357 06/02/21 1535 06/03/21 0307 06/03/21 0500 06/04/21 0234 06/04/21 0508 06/04/21 1126 06/04/21 1611 06/05/21 0410 06/05/21 1610 06/06/21 0420  NA 130*   < >  --    < >  --  134* 133* 134* 133* 133* 137  K 3.1*   < >  --    < >  --  3.1* 3.2* 3.4* 3.5 3.6 3.7  CL 99   < >  --    < >  --  101 103 103 104 101 104  CO2 19*   < >  --    < >  --  _0 21* 25  GLUCOSE 95   < >  --    < >  --  154* 175* 193* 182* 203* 147*  BUN 58*   < >  --    < >  --  _1 CREATININE 7.70*   < >  --    < >  --  1.68* 1.54* 1.52* 1.42* 1.41* 1.24  CALCIUM 6.6*   < >  --    < >  --  7.9* 7.9* 7.7* 7.2* 7.1* 7.4*  MG 1.6*  --  1.8  --  1.9  --   --   --  2.2  --  2.4  PHOS 5.8*   < >  --    < >  --  1.5*  --  1.6* 1.7* 3.1 1.6*   < > = values in this interval not displayed.    GFR: Estimated Creatinine Clearance: 86.1 mL/min (by C-G  formula based on SCr of 1.24 mg/dL). Recent Labs  Lab 06/02/21 0357 06/02/21 0916 06/02/21 1123 06/03/21 0307 06/04/21 0016 06/04/21 0158 06/04/21 0210 06/04/21 0234 06/04/21 0936 06/04/21 1126 06/04/21 2308 06/05/21 0410 06/05/21 0417 06/06/21 0423  PROCALCITON 93.56  --   --  67.49  --  34.14  --   --   --   --   --  49.08  --   --   WBC 8.6  --   --  25.9* 22.5*  --   --    < >  24.1* 25.2* 29.3*  --  30.3* 32.5*  LATICACIDVEN  --    < > 4.3*  --  2.8*  --  2.8*  --  3.3*  --   --   --   --   --    < > = values in this interval not displayed.     Liver Function Tests: Recent Labs  Lab 05/31/21 0835 06/01/21 0404 06/01/21 1325 06/01/21 1937 06/03/21 1416 06/04/21 0016 06/04/21 0508 06/04/21 1611 06/05/21 0410 06/05/21 1610 06/06/21 0420  AST 24  --  101*  --  21 18  --   --   --   --   --   ALT 33  --  78*  --  42 39  --   --   --   --   --   ALKPHOS 70  --  68  --  91 89  --   --   --   --   --   BILITOT 0.6  --  1.1  --  2.2* 2.3*  --   --   --   --   --   PROT 6.3*  --  4.7*  --  4.4* 4.6*  --   --   --   --   --   ALBUMIN 2.9*   < > 2.0*   < > 1.7*   1.7* 1.8* 1.8* 1.7* 1.7* 1.7* 1.7*   < > = values in this interval not displayed.    Recent Labs  Lab 05/31/21 0835  LIPASE 29    Recent Labs  Lab 06/04/21 0016  AMMONIA 23     ABG    Component Value Date/Time   PHART 7.39 06/04/2021 0508   PCO2ART 40 06/04/2021 0508   PO2ART 109 (H) 06/04/2021 0508   HCO3 24.2 06/04/2021 0508   TCO2 22 06/24/2007 0045   ACIDBASEDEF 0.7 06/04/2021 0508   O2SAT 98.2 06/04/2021 0508      Coagulation Profile: Recent Labs  Lab 05/31/21 0835 06/01/21 1715 06/04/21 0234  INR 1.3* 2.3* 1.3*     Cardiac Enzymes: Recent Labs  Lab 05/31/21 1114  CKTOTAL 411*     HbA1C: Hemoglobin-A1c  Date/Time Value Ref Range Status  06/26/2007 06:40 AM   Final   17.6% HIGH (NOTE) Reference Intervals:  Diabetic Adult        <9.0 Healthy Adult         3.9 - 7.3  Current ADA guidelines recommend a treatment goal of <7.0% HgbA1c for diabetic patients, which corresponds to a <9.0% Glycohemoglobin result with this method.   Performed at:  [facility]               [place], [place]  [id number]   Hemoglobin A1C  Date/Time Value Ref Range Status  08/05/2013 01:26 PM 12.9 (H) 4.2 - 6.3 % Final    Comment:    The American Diabetes Association recommends that a primary goal of therapy should be <7% and that physicians should reevaluate the treatment regimen in patients with HbA1c values consistently >8%.    Hgb A1c MFr Bld  Date/Time Value Ref Range Status  05/31/2021 12:05 PM 5.7 (H) 4.8 - 5.6 % Final    Comment:    RESULTS CONFIRMED BY MANUAL DILUTION REPEATED TO VERIFY (NOTE) Pre diabetes:          5.7%-6.4%  Diabetes:              >  6.4%  Glycemic control for   <7.0% adults with diabetes   06/11/2020 03:41 AM 7.0 (H) 4.8 - 5.6 % Final    Comment:    (NOTE) Pre diabetes:          5.7%-6.4%  Diabetes:              >6.4%  Glycemic control for   <7.0% adults with diabetes     CBG: Recent Labs  Lab 06/05/21 1109 06/05/21 1604 06/05/21 1936 06/05/21 2312 06/06/21 0404  GLUCAP 177* 177* 171* 162* 126*    Home Medications  Prior to Admission medications   Medication Sig Start Date End Date Taking? Authorizing Provider  ondansetron (ZOFRAN) 4 MG tablet Take 1 tablet (4 mg total) by mouth every 8 (eight) hours as needed for up to 10 doses for nausea or vomiting. 05/31/21  Yes Lucrezia Starch, MD  amLODipine (NORVASC) 10 MG tablet Take 1 tablet (10 mg total) by mouth daily. Patient not taking: Reported on 06/01/2021 06/14/20   Fritzi Mandes, MD  hydrALAZINE (APRESOLINE) 50 MG tablet Take 1 tablet (50 mg total) by mouth 2 (two) times daily. Patient not taking: Reported on 06/01/2021 06/13/20   Fritzi Mandes, MD  insulin glargine (LANTUS) 100 UNIT/ML injection Inject 20 Units into the skin at bedtime. Patient not  taking: Reported on 06/01/2021 08/26/20   [provider]  omeprazole (PRILOSEC) 40 MG capsule Take 1 capsule (40 mg total) by mouth daily. 03/08/20 05/07/20  Arta Silence, MD  pantoprazole (PROTONIX) 40 MG tablet Take 1 tablet (40 mg total) by mouth daily. Patient not taking: Reported on 12/10/2018 09/10/18 01/02/20  Hillary Bow, MD  sucralfate (CARAFATE) 1 g tablet Take 1 tablet (1 g total) by mouth 4 (four) times daily. Patient not taking: Reported on 06/26/2019 02/04/19 01/02/20  Nance Pear, MD      DVT/GI PRX  assessed I Assessed the need for Labs I Assessed the need for Foley I Assessed the need for Central Venous Line Family Discussion when available I Assessed the need for Mobilization I made an Assessment of medications to be adjusted accordingly Safety Risk assessment completed  CASE DISCUSSED IN MULTIDISCIPLINARY ROUNDS WITH ICU TEAM     Critical Care Time devoted to patient care services described in this note is 55 minutes.  Critical care was necessary to treat /prevent imminent and life-threatening deterioration. Overall, patient is critically ill, prognosis is guarded.  Patient with Multiorgan failure and at high risk for cardiac arrest and death.    Corrin Parker, M.D.  [facility]  Medical Director [place] Director [facility] Cardio-Pulmonary Department

## 2021-06-06 NOTE — Consult Note (Signed)
[place] for Electrolyte Monitoring and Replacement   Recent Labs: Potassium (mmol/L)  Date Value  06/06/2021 3.7  08/05/2013 3.8   Magnesium (mg/dL)  Date Value  06/06/2021 2.4   Calcium (mg/dL)  Date Value  06/06/2021 7.4 (L)   Calcium, Total (PTH) (mg/dL)  Date Value  06/12/2020 8.0   Albumin (g/dL)  Date Value  06/06/2021 1.7 (L)  08/05/2013 2.9 (L)   Phosphorus (mg/dL)  Date Value  06/06/2021 1.6 (L)   Sodium (mmol/L)  Date Value  06/06/2021 137  08/05/2013 133 (L)   Assessment: Jonathan Mcmillan is a 26 y.o. male with medical history including diabetes, CKD, HTN, nicotine dependence admitted on 05/31/2021 with  AKI on CKD, anemia, and fevers/chills/myalgias . Patient suffered cardiac arrest on the morning of 1/19 and was ultimately intubated and transferred to the ICU. Patient is currently on CRRT per nephrology. Pharmacy has been consulted to monitor and replace electrolytes.  Phos: 1.7>3.1>1.6 Na: 133>137 K:3.6>3.7   Goal of Therapy:  Electrolytes within normal limits  Plan:  Remains on CRRT (Prismasol 4K/2.5Ca) Potassium correction with CRRT fluids (improved WNL) Phos 1.7>3.1>1.6 today. Na/K+ WNL. Will dose Phos-NaK 500 TID per tube x3. Continue to monitor renal function panels BID while on CRRT  Lorna Dibble, PharmD, [facility] Clinical Pharmacist 06/06/2021 7:43 AM

## 2021-06-07 DIAGNOSIS — Z515 Encounter for palliative care: Secondary | ICD-10-CM

## 2021-06-07 DIAGNOSIS — N185 Chronic kidney disease, stage 5: Secondary | ICD-10-CM

## 2021-06-07 DIAGNOSIS — Z7189 Other specified counseling: Secondary | ICD-10-CM

## 2021-06-07 DIAGNOSIS — D649 Anemia, unspecified: Secondary | ICD-10-CM

## 2021-06-07 DIAGNOSIS — D696 Thrombocytopenia, unspecified: Secondary | ICD-10-CM

## 2021-06-07 LAB — RENAL FUNCTION PANEL
Albumin: 1.7 g/dL — ABNORMAL LOW (ref 3.5–5.0)
Albumin: 1.7 g/dL — ABNORMAL LOW (ref 3.5–5.0)
Anion gap: 7 (ref 5–15)
Anion gap: 10 (ref 5–15)
BUN: 20 mg/dL (ref 6–20)
BUN: 19 mg/dL (ref 6–20)
CO2: 24 mmol/L (ref 22–32)
CO2: 24 mmol/L (ref 22–32)
Calcium: 7.2 mg/dL — ABNORMAL LOW (ref 8.9–10.3)
Calcium: 7 mg/dL — ABNORMAL LOW (ref 8.9–10.3)
Chloride: 105 mmol/L (ref 98–111)
Chloride: 104 mmol/L (ref 98–111)
Creatinine, Ser: 1.05 mg/dL (ref 0.61–1.24)
Creatinine, Ser: 1.02 mg/dL (ref 0.61–1.24)
GFR, Estimated: >60 mL/min (ref >60)
GFR, Estimated: >60 mL/min (ref >60)
Glucose, Bld: 196 mg/dL — ABNORMAL HIGH (ref 70–99)
Glucose, Bld: 93 mg/dL (ref 70–99)
Phosphorus: 1.9 mg/dL — ABNORMAL LOW (ref 2.5–4.6)
Phosphorus: 3 mg/dL (ref 2.5–4.6)
Potassium: 3.7 mmol/L (ref 3.5–5.1)
Potassium: 3.6 mmol/L (ref 3.5–5.1)
Sodium: 136 mmol/L (ref 135–145)
Sodium: 138 mmol/L (ref 135–145)

## 2021-06-07 LAB — PHOSPHORUS: Phosphorus: 1.9 mg/dL — ABNORMAL LOW (ref 2.5–4.6)

## 2021-06-07 LAB — CBC WITH DIFFERENTIAL/PLATELET
Abs Immature Granulocytes: 1.3 10*3/uL — ABNORMAL HIGH (ref 0.00–0.07)
Abs Immature Granulocytes: 1.64 10*3/uL — ABNORMAL HIGH (ref 0.00–0.07)
Basophils Absolute: 0 10*3/uL (ref 0.0–0.1)
Basophils Absolute: 0.2 10*3/uL — ABNORMAL HIGH (ref 0.0–0.1)
Basophils Relative: 0 %
Basophils Relative: 1 %
Eosinophils Absolute: 0 10*3/uL (ref 0.0–0.5)
Eosinophils Absolute: 0 10*3/uL (ref 0.0–0.5)
Eosinophils Relative: 0 %
Eosinophils Relative: 0 %
HCT: 22.7 % — ABNORMAL LOW (ref 39.0–52.0)
HCT: 20.9 % — ABNORMAL LOW (ref 39.0–52.0)
Hemoglobin: 7.8 g/dL — ABNORMAL LOW (ref 13.0–17.0)
Hemoglobin: 7.3 g/dL — ABNORMAL LOW (ref 13.0–17.0)
Immature Granulocytes: 4 %
Immature Granulocytes: 5 %
Lymphocytes Relative: 9 %
Lymphocytes Relative: 14 %
Lymphs Abs: 2.9 10*3/uL (ref 0.7–4.0)
Lymphs Abs: 4.7 10*3/uL — ABNORMAL HIGH (ref 0.7–4.0)
MCH: 30.1 pg (ref 26.0–34.0)
MCH: 29.8 pg (ref 26.0–34.0)
MCHC: 34.4 g/dL (ref 30.0–36.0)
MCHC: 34.9 g/dL (ref 30.0–36.0)
MCV: 87.6 fL (ref 80.0–100.0)
MCV: 85.3 fL (ref 80.0–100.0)
Monocytes Absolute: 0.8 10*3/uL (ref 0.1–1.0)
Monocytes Absolute: 1.2 10*3/uL — ABNORMAL HIGH (ref 0.1–1.0)
Monocytes Relative: 3 %
Monocytes Relative: 3 %
Neutro Abs: 27.6 10*3/uL — ABNORMAL HIGH (ref 1.7–7.7)
Neutro Abs: 26.5 10*3/uL — ABNORMAL HIGH (ref 1.7–7.7)
Neutrophils Relative %: 84 %
Neutrophils Relative %: 77 %
Platelets: 9 10*3/uL — CL (ref 150–400)
Platelets: 21 10*3/uL — CL (ref 150–400)
RBC: 2.59 MIL/uL — ABNORMAL LOW (ref 4.22–5.81)
RBC: 2.45 MIL/uL — ABNORMAL LOW (ref 4.22–5.81)
RDW: 15.4 % (ref 11.5–15.5)
RDW: 15 % (ref 11.5–15.5)
Smear Review: DECREASED
Smear Review: DECREASED
WBC Morphology: INCREASED
WBC Morphology: INCREASED
WBC: 32.6 10*3/uL — ABNORMAL HIGH (ref 4.0–10.5)
WBC: 34.2 10*3/uL — ABNORMAL HIGH (ref 4.0–10.5)
nRBC: 0.2 % (ref 0.0–0.2)
nRBC: 0.4 % — ABNORMAL HIGH (ref 0.0–0.2)

## 2021-06-07 LAB — GLUCOSE, CAPILLARY
Glucose-Capillary: 148 mg/dL — ABNORMAL HIGH (ref 70–99)
Glucose-Capillary: 164 mg/dL — ABNORMAL HIGH (ref 70–99)
Glucose-Capillary: 170 mg/dL — ABNORMAL HIGH (ref 70–99)
Glucose-Capillary: 180 mg/dL — ABNORMAL HIGH (ref 70–99)
Glucose-Capillary: 192 mg/dL — ABNORMAL HIGH (ref 70–99)
Glucose-Capillary: 201 mg/dL — ABNORMAL HIGH (ref 70–99)
Glucose-Capillary: 90 mg/dL (ref 70–99)

## 2021-06-07 LAB — BASIC METABOLIC PANEL
Anion gap: 8 (ref 5–15)
BUN: 20 mg/dL (ref 6–20)
CO2: 23 mmol/L (ref 22–32)
Calcium: 7.2 mg/dL — ABNORMAL LOW (ref 8.9–10.3)
Chloride: 104 mmol/L (ref 98–111)
Creatinine, Ser: 0.99 mg/dL (ref 0.61–1.24)
GFR, Estimated: >60 mL/min (ref >60)
Glucose, Bld: 194 mg/dL — ABNORMAL HIGH (ref 70–99)
Potassium: 3.7 mmol/L (ref 3.5–5.1)
Sodium: 135 mmol/L (ref 135–145)

## 2021-06-07 LAB — CULTURE, RESPIRATORY W GRAM STAIN

## 2021-06-07 LAB — TRIGLYCERIDES: Triglycerides: 345 mg/dL — ABNORMAL HIGH (ref <150)

## 2021-06-07 LAB — MAGNESIUM: Magnesium: 2.5 mg/dL — ABNORMAL HIGH (ref 1.7–2.4)

## 2021-06-07 MED ORDER — SODIUM PHOSPHATES 45 MMOLE/15ML IV SOLN
30.0000 mmol | Freq: Once | INTRAVENOUS | Status: AC
  Administered 2021-06-07: 10:00:00 30 mmol via INTRAVENOUS
  Filled 2021-06-07: qty 10

## 2021-06-07 MED ORDER — PANTOPRAZOLE 2 MG/ML SUSPENSION
40.0000 mg | Freq: Every day | ORAL | Status: DC
  Administered 2021-06-07 – 2021-06-08 (×2): 40 mg
  Filled 2021-06-07 (×2): qty 20

## 2021-06-07 MED ORDER — SODIUM CHLORIDE 0.9 % IV SOLN
2.0000 g | Freq: Two times a day (BID) | INTRAVENOUS | Status: DC
  Administered 2021-06-07 (×2): 2 g via INTRAVENOUS
  Filled 2021-06-07 (×5): qty 2

## 2021-06-07 MED ORDER — SODIUM CHLORIDE 0.9 % IV SOLN
2.0000 g | Freq: Three times a day (TID) | INTRAVENOUS | Status: DC
  Filled 2021-06-07 (×2): qty 2

## 2021-06-07 MED ORDER — DEXMEDETOMIDINE HCL IN NACL 400 MCG/100ML IV SOLN
0.4000 ug/kg/h | INTRAVENOUS | Status: DC
  Administered 2021-06-07: 12:00:00 0.4 ug/kg/h via INTRAVENOUS
  Administered 2021-06-07: 19:00:00 0.7 ug/kg/h via INTRAVENOUS
  Administered 2021-06-08: 1 ug/kg/h via INTRAVENOUS
  Administered 2021-06-08: 0.5 ug/kg/h via INTRAVENOUS
  Administered 2021-06-08: 1 ug/kg/h via INTRAVENOUS
  Filled 2021-06-07 (×5): qty 100

## 2021-06-07 MED ORDER — SODIUM CHLORIDE 0.9% IV SOLUTION: Freq: Once | INTRAVENOUS | Status: AC

## 2021-06-07 NOTE — Consult Note (Signed)
[place] for Electrolyte Monitoring and Replacement   Recent Labs: Potassium (mmol/L)  Date Value  06/07/2021 3.7  06/07/2021 3.7  08/05/2013 3.8   Magnesium (mg/dL)  Date Value  06/07/2021 2.5 (H)   Calcium (mg/dL)  Date Value  06/07/2021 7.2 (L)  06/07/2021 7.2 (L)   Calcium, Total (PTH) (mg/dL)  Date Value  06/12/2020 8.0   Albumin (g/dL)  Date Value  06/07/2021 1.7 (L)  08/05/2013 2.9 (L)   Phosphorus (mg/dL)  Date Value  06/07/2021 1.9 (L)  06/07/2021 1.9 (L)   Sodium (mmol/L)  Date Value  06/07/2021 136  06/07/2021 135  08/05/2013 133 (L)   Assessment: Jonathan Mcmillan is a 26 y.o. male with medical history including diabetes, CKD, HTN, nicotine dependence admitted on 05/31/2021 with  AKI on CKD, anemia, and fevers/chills/myalgias . Patient suffered cardiac arrest on the morning of 1/19 and was ultimately intubated and transferred to the ICU. Patient is currently on CRRT per nephrology. Pharmacy has been consulted to monitor and replace electrolytes.   Goal of Therapy:  Electrolytes within normal limits  Plan:  --Remains on CRRT --Phos 30 mmol IV x 1 for ongoing loss s/t CRRT --Continue to monitor renal function panels BID while on CRRT  Tawnya Crook, PharmD, BCPS Clinical Pharmacist 06/07/2021 10:46 AM

## 2021-06-07 NOTE — Progress Notes (Signed)
Nutrition Follow-up  DOCUMENTATION CODES:   Not applicable  INTERVENTION:   Continue TF via OGT:   Vital 1.5 @ 50 ml/hr  45 ml Prosource TF TID  30 ml free water flush every 4 hours  Tube feeding regimen provides 1920 kcal (100% of needs), 114 grams of protein, and 917 ml of H2O.  Total free water: 1097 ml daily  -1 packet Juven BID, each packet provides 95 calories, 2.5 grams of protein (collagen), and 9.8 grams of carbohydrate (3 grams sugar); also contains 7 grams of L-arginine and L-glutamine, 300 mg vitamin C, 15 mg vitamin E, 1.2 mcg vitamin B-12, 9.5 mg zinc, 200 mg calcium, and 1.5 g  Calcium Beta-hydroxy-Beta-methylbutyrate to support wound healing   NUTRITION DIAGNOSIS:   Inadequate oral intake related to inability to eat (pt sedated and ventilated) as evidenced by NPO status.  Ongoing  GOAL:   Provide needs based on ASPEN/SCCM guidelines  Met with TF  MONITOR:   Vent status, Labs, Weight trends, TF tolerance, Skin, I & O's  REASON FOR ASSESSMENT:   Malnutrition Screening Tool, Ventilator    ASSESSMENT:   26 y.o. male with medical history significant for type 1 diabetes mellitus, stage IV chronic kidney disease, hypertension and nicotine dependence who presents to the emergency room for evaluation of a 3-day history of fever, chills, nonproductive cough, myalgias and diarrhea. Pt found to have SIRS, AKI and anemia.  1/21- extubated and reintubated  Patient is currently intubated on ventilator support MV: 8.7 L/min Temp (24hrs), Avg:97.8 F (36.6 C), Min:96.1 F (35.6 C), Max:99 F (37.2 C)  Reviewed I/O's: -1.1 L x 24 hours and -768 ml since admission  NGT output: 100 ml x 24 hours  Rectal tube output: 400 ml x 24 hours   Case discussed with MD, RN, and during ICU rounds. Pt if off levophed and maxed out on neo; plan to start precedex. Pt is following commands; plan for wake up assessment today.   Pt remains on CRRT and not producing urine.    TF resumed on 1/24; pt tolerating well at goals rate per RN. Pt is having diarrhea, but with rectal tube.   Medications reviewed and include colace, reglan, solu-cortef, miralax, amidarone, precedex, phenylephrine, and vasopressin.   Labs reviewed: CBGS: 86-201 (inpatient orders for glycemic control are 3-9 units insulin aspart every 4 hours).    Diet Order:   Diet Order             Diet NPO time specified  Diet effective now                   EDUCATION NEEDS:   No education needs have been identified at this time  Skin:  Skin Assessment: Reviewed RN Assessment (L and R heel wounds)  Last BM:  06/07/21 (100 ml via rectal tube)  Height:   Ht Readings from Last 1 Encounters:  06/03/21 5' 7.99" (1.727 m)    Weight:   Wt Readings from Last 1 Encounters:  06/07/21 67.9 kg    Ideal Body Weight:  70 kg  BMI:  Body mass index is 22.77 kg/m.  Estimated Nutritional Needs:   Kcal:  1988kcal/day  Protein:  100-115g/day  Fluid:  2.0-2.3L/day    Loistine Chance, RD, LDN, [place] Registered Dietitian II Certified Diabetes Care and Education Specialist Please refer to AMION for RD and/or RD on-call/weekend/after hours pager

## 2021-06-07 NOTE — Progress Notes (Signed)
SUBJECTIVE: Jonathan Mcmillan is a 26 y.o. male with medical history including diabetes, CKD, HTN, nicotine dependence admitted on 05/31/2021 with AKI on CKD, anemia, and fevers/chills/myalgias . Patient suffered cardiac arrest on the morning of 1/19 and was ultimately intubated and transferred to the ICU.   Patient sedated and intubated.      Vitals:   06/07/21 0710 06/07/21 0745 06/07/21 0800 06/07/21 0817  BP:  108/73 104/72 103/70  Pulse:    79  Resp:  20 20 20   Temp:  (!) 97 F (36.1 C) (!) 97.5 F (36.4 C) (!) 97.3 F (36.3 C)  TempSrc:  Esophageal Esophageal   SpO2: 100% 100% 100%   Weight:      Height:        Intake/Output Summary (Last 24 hours) at 06/07/2021 0845 Last data filed at 06/07/2021 0800 Gross per 24 hour  Intake 3619.3 ml  Output 4707 ml  Net -1087.7 ml    LABS: Basic Metabolic Panel: Recent Labs    06/06/21 0420 06/06/21 1530 06/07/21 0435  NA 137 137 136   135  K 3.7 3.7 3.7   3.7  CL 104 104 105   104  CO2 25 25 24   23   GLUCOSE 147* 138* 196*   194*  BUN 19 18 20   20   CREATININE 1.24 1.08 1.05   0.99  CALCIUM 7.4* 7.3* 7.2*   7.2*  MG 2.4  --  2.5*  PHOS 1.6* 1.9* 1.9*   1.9*   Liver Function Tests: Recent Labs    06/06/21 1530 06/07/21 0435  ALBUMIN 1.6* 1.7*   No results for input(s): LIPASE, AMYLASE in the last 72 hours. CBC: Recent Labs    06/05/21 0417 06/06/21 0423 06/07/21 0435  WBC 30.3* 32.5* 32.6*  NEUTROABS 26.2*  --  27.6*  HGB 8.3* 7.9* 7.8*  HCT 23.8* 22.6* 22.7*  MCV 86.9 86.3 87.6  PLT 15* 11* 9*   Cardiac Enzymes: No results for input(s): CKTOTAL, CKMB, CKMBINDEX, TROPONINI in the last 72 hours. BNP: Invalid input(s): POCBNP D-Dimer: No results for input(s): DDIMER in the last 72 hours. Hemoglobin A1C: No results for input(s): HGBA1C in the last 72 hours. Fasting Lipid Panel: Recent Labs    06/07/21 0435  TRIG 345*   Thyroid Function Tests: No results for input(s): TSH, T4TOTAL, T3FREE, THYROIDAB  in the last 72 hours.  Invalid input(s): FREET3 Anemia Panel: Recent Labs    06/04/21 1254 06/06/21 0423  VITAMINB12 4,519*  --   FOLATE 12.7  --   RETICCTPCT 0.5 0.5     PHYSICAL EXAM General: critically ill HEENT:  Normocephalic and atramatic Neck:         No JVD.  Lungs: vent assited Heart: sinus rhythm Abdomen:  soft  Extremities: peripheral edema   Neuro: intubated  TELEMETRY: sinus rhythm, HR 78 bpm  ASSESSMENT AND PLAN: Patient cardiac arrest likely due to asystole secondary to metabolic disorder, Mildly elevated troponin levels due to demand ischemia.   Echo performed 06/01/21 revealed EF 35-40%. Mild LV dysfunction likely non-cardiac, age of patient most likely not CAD.    06/05/21 Patient went in to atrial fibrillation with RVR. Amiodarone drip per protocol started. Patient heart rate improved today, 78 bpm and converted to sinus rhythm. Continue amiodarone for now. Will continue to follow.  Principal Problem:   AKI (acute kidney injury) ([place]) Active Problems:   Hypertension   Acute kidney injury superimposed on CKD ([name])   Type 2 diabetes  mellitus with hypoglycemia without coma, without long-term current use of insulin (HCC)   Nicotine dependence   Anemia of chronic disease   Hypotension   Fever   Cardiopulmonary arrest (HCC)   Pressure injury of skin   Acute respiratory failure with hypoxia ([place])    Jonathan Mcconnell, FNP-C 06/07/2021 8:45 AM

## 2021-06-07 NOTE — Progress Notes (Signed)
NAME:  Jonathan Mcmillan, MRN:  [id number], DOB:  May 22, 1995, LOS: 7 ADMISSION DATE:  05/31/2021, CONSULTATION DATE:  06/01/2021 REFERRING MD:  Dr. Leslye Peer, CHIEF COMPLAINT:  Cardiac Arrest   Brief Pt Description / Synopsis:  26 y.o. admitted with AKI on CKD in setting of viral syndrome and diarrhea.  Suffered in-hospital cardiac arrest (initial rhythm asystole), suspect due to severe metabolic acidosis and multiple metabolic derangements.  History of Present Illness:  Jonathan Mcmillan is a 26 year old male with a past medical history significant for diabetes mellitus type 1, chronic kidney disease stage IV, hypertension, nicotine dependence who presented to [facility] ED on 05/31/2021 due to 3-day history of fever, chills, nonproductive cough, myalgias, and diarrhea. Patient denied urinary frequency, nocturia, dysuria, abdominal pain, hematemesis, chest pain, shortness of breath, edema.  ED Course: Initial vital signs: Temperature 102.6, RR 22, HR 104, BP 139/70, SpO2 97% on room air Significant labs: Sodium 131, potassium 4.4, chloride 103, bicarb 15, glucose 177, BUN 19, creatinine 13.86 (baseline of 3.92), calcium 6.7, alkaline phosphatase 70, lipase 29, AST 24, ALT 33, lactic acid 2.1, WBC 12.7, hemoglobin 5.2 (baseline of 8.3) hematocrit 15.8, MCV 89.8, RDW 12.8, platelets 172, PT 15.8, INR 1.3. COVID-19 and influenza PCR negative Respiratory viral panel negative Urinalysis with proteinuria Imaging: Chest x-ray negative for acute cardiopulmonary disease CT Chest w/o contrast>>1. Trace pericardial effusion and mild diffuse body wall edema, new since 01/02/2020. 2. Clear lungs, with no focal consolidation or pleural effusion. 3. Hypodense blood pool consistent with anemia. Renal US>>Increased renal echogenicity compatible with medical renal disease. Retroperitoneal perinephric edema suspect related to volume overload.  Patient was admitted by the hospitalist for further work-up and treatment of  acute kidney injury on CKD.  Nephrology was consulted.   Early in the morning on 06/01/2021 rapid response was called when he was found in the shower on a chair passed out. Found to be  hypotensive, hypoglycemic, and lethargic with his "eyes rolling in the back of his head."  Patient was given 500 cc fluid bolus and ordered for blood transfusion.  However CODE BLUE was called as pt was found to be in asystole (progressed to PEA).  He received epinephrine x2, sodium bicarbonate x2, calcium x1, glucose and insulin.  Pulse was regained and he was transferred to ICU.  Upon arrival to the ICU patient patient is critically ill and concern for decorticate posturing.  PCCM consulted for further management.  INTERVAL HISTORY Severe multiorgan failure Severe resp and renal failure Plt transfusions today On CRRT and pressors      Pertinent  Medical History  Chronic Kidney Disease Stage IV Diabetes Mellitus 1  Micro Data:  05/31/2021: SARS-CoV-2 and influenza PCR>> negative 05/31/2021: Respiratory viral panel>> negative 05/31/2021: Group A strep PCR>> not detected 05/31/2021: Blood culture x2>> no growth to date 05/31/2021: GI panel>> negative 05/31/2021: C. Difficile>> negative 06/01/2021: Tracheal aspirate>> gram + cocci, gram + rods, gram - rods,  06/01/2021: Urine>> 06/01/2021: Blood culture>>  Antimicrobials:  Ceftriaxone 1/19 x 1 dose Vancomycin 1/19>> Zosyn 1/19>>  Significant Hospital Events: Including procedures, antibiotic start and stop dates in addition to other pertinent events   1/18: Admitted by the hospitalist.  Nephrology consulted, plans for vascular to place temporary dialysis catheter with initiation of dialysis 1/19: In-hospital cardiac arrest.  Critically ill. Transferred to ICU.  Plan for CRRT.  Central line, A line, and Trialysis catheter placed.  Concern for possible anoxic brain injury.  Sepsis workup in progress 1/20: Pt is  awake, tracking, following simple commands.  Remains on CRRT, bicarb gtt d/c.  Weaning fiO2 and pressors. Tracheal aspirate growing gram + cocci, gram + rods, gram - rods 1/21: Extubated; leukocytosis, thrombocytopenia 1/22: Reintubated overnight; apparent large volume aspiration; worsening shock 1/23 remains with multiorgan failure, plt transfusion 1/24 severe multiorgan failure    Objective   Blood pressure (!) 97/59, pulse (!) 32, temperature 98.2 F (36.8 C), resp. rate 20, height 5' 7.99" (1.727 m), weight 67.9 kg, SpO2 100 %.    Vent Mode: PRVC FiO2 (%):  [30 %] 30 % Set Rate:  [20 bmp] 20 bmp Vt Set:  [450 mL] 450 mL PEEP:  [5 cmH20] 5 cmH20 Plateau Pressure:  [15 cmH20-16 cmH20] 15 cmH20   Intake/Output Summary (Last 24 hours) at 06/07/2021 0709 Last data filed at 06/07/2021 0700 Gross per 24 hour  Intake 3498.3 ml  Output 4569 ml  Net -1070.7 ml    Filed Weights   06/05/21 0423 06/06/21 0417 06/07/21 0500  Weight: 69 kg 68 kg 67.9 kg    REVIEW OF SYSTEMS  PATIENT IS UNABLE TO PROVIDE COMPLETE REVIEW OF SYSTEMS DUE TO SEVERE CRITICAL ILLNESS AND TOXIC METABOLIC ENCEPHALOPATHY    PHYSICAL EXAMINATION:  GENERAL:critically ill appearing, +resp distress EYES: Pupils equal, round, reactive to light.  No scleral icterus.  MOUTH: Moist mucosal membrane. INTUBATED NECK: Supple.  PULMONARY: +rhonchi, +wheezing CARDIOVASCULAR: S1 and S2.  No murmurs  GASTROINTESTINAL: Soft, nontender, -distended. Positive bowel sounds.  MUSCULOSKELETAL: No swelling, clubbing, or edema.  NEUROLOGIC: obtunded SKIN:intact,warm,dry    Assessment & Plan:     Admitted to ICU for In-Hospital Cardiac Arrest (initial rhythm asystole), suspect in setting of severe metabolic acidosis and multiple metabolic derangements New systolic heart failure with severe aspiration pneumonia  and multiorgan failure   Severe ACUTE Hypoxic and Hypercapnic Respiratory Failure -continue Mechanical Ventilator support -Wean Fio2 and PEEP as  tolerated -VAP/VENT bundle implementation - Wean PEEP & FiO2 as tolerated, maintain SpO2 > 88% - Head of bed elevated 30 degrees, VAP protocol in place - Plateau pressures less than 30 cm H20  - Intermittent chest x-ray & ABG PRN - Ensure adequate pulmonary hygiene  -will perform SAT/SBT when respiratory parameters are met  SEPTIC shock SOURCE-Lungs,GI -use vasopressors to keep MAP>65 as needed -follow ABG and LA as needed -follow up cultures -emperic ABX stress dose steroids   ACUTE SYSTOLIC CARDIAC FAILURE- EF 35% Afib with RVR -follow up cardiac enzymes as indicated -follow up cardiology recs On AMIO infusion   PMHx: Hypertension -Echocardiogram 06/01/21>>LVEF 35-40% and global hypokinesis, grade II diastolic dysfunction, mildly reduced RV function, and moderately increased Pulmonary systolic pressure -Urine drug screen + for Cannabinoid -Cardiology following, appreciate input    Thrombocytopenia (4T score is 3-4) -Dr. Tasia Catchings with Hematology consulted TTP lees likely at this time -Monitor for S/Sx of bleeding -Trend CBC -SCD's for VTE Prophylaxis  -Transfuse for Hgb <7 -Transfuse platelets for platelet count <10K -Timeline does not fit with HIT, 4T score is low to moderate probability given his baseline dialysis requirement and probable heparin exposure -Suspect thrombocytopenia in setting of sepsis, however, will continue to monitor -Sent HIT panel, ADAMTS13, haptoglobin; doubt HUS but on differential -Avoid heparin products  ACUTE KIDNEY INJURY/Renal Failure -continue Foley Catheter-assess need -Avoid nephrotoxic agents -Follow urine output, BMP -Ensure adequate renal perfusion, optimize oxygenation -Renal dose medications On CRRT   Intake/Output Summary (Last 24 hours) at 06/07/2021 0711 Last data filed at 06/07/2021 0700 Gross per 24 hour  Intake 3498.3 ml  Output 4569 ml  Net -1070.7 ml    ENDO - ICU hypoglycemic\Hyperglycemia protocol -check FSBS per  protocol   GI GI PROPHYLAXIS as indicated  NUTRITIONAL STATUS DIET-->TF's as tolerated Constipation protocol as indicated   ELECTROLYTES -follow labs as needed -replace as needed -pharmacy consultation and following    Best Practice (right click and "Reselect all SmartList Selections" daily)   Diet/type: NPO DVT prophylaxis: SCD (holding chemical prophylaxis due to thrombocytopenia) GI prophylaxis: PPI Lines: Central line and yes and it is still needed Foley:  Yes, and it is still needed Code Status:  full code Family updated 1/23   Labs   CBC: Recent Labs  Lab 05/31/21 0835 06/01/21 0404 06/04/21 0936 06/04/21 1126 06/04/21 2308 06/05/21 0417 06/06/21 0423 06/07/21 0435  WBC 12.7*   < > 24.1* 25.2* 29.3* 30.3* 32.5* 32.6*  NEUTROABS 11.6*  --  19.3*  --  21.9* 26.2*  --  27.6*  HGB 5.2*   < > 8.9* 8.8* 8.7* 8.3* 7.9* 7.8*  HCT 15.8*   < > 25.4* 24.6* 24.8* 23.8* 22.6* 22.7*  MCV 89.8   < > 84.9 84.8 86.1 86.9 86.3 87.6  PLT 172   < > 10* 10* 29* 15* 11* 9*   < > = values in this interval not displayed.     Basic Metabolic Panel: Recent Labs  Lab 06/03/21 0307 06/03/21 0500 06/04/21 0234 06/04/21 0508 06/05/21 0410 06/05/21 1610 06/06/21 0420 06/06/21 1530 06/07/21 0435  NA  --    < >  --    < > 133* 133* 137 137 136   135  K  --    < >  --    < > 3.5 3.6 3.7 3.7 3.7   3.7  CL  --    < >  --    < > 104 101 104 104 105   104  CO2  --    < >  --    < > 22 21* _0 GLUCOSE  --    < >  --    < > 182* 203* 147* 138* 196*   194*  BUN  --    < >  --    < > _1 CREATININE  --    < >  --    < > 1.42* 1.41* 1.24 1.08 1.05   0.99  CALCIUM  --    < >  --    < > 7.2* 7.1* 7.4* 7.3* 7.2*   7.2*  MG 1.8  --  1.9  --  2.2  --  2.4  --  2.5*  PHOS  --    < >  --    < > 1.7* 3.1 1.6* 1.9* 1.9*   1.9*   < > = values in this interval not displayed.    GFR: Estimated Creatinine Clearance: 101.5 mL/min (by C-G formula based on SCr of  1.05 mg/dL). Recent Labs  Lab 06/02/21 1123 06/03/21 0307 06/04/21 0016 06/04/21 0158 06/04/21 0210 06/04/21 0234 06/04/21 0936 06/04/21 1126 06/04/21 2308 06/05/21 0410 06/05/21 0417 06/06/21 0420 06/06/21 0423 06/07/21 0435  PROCALCITON  --  67.49  --  34.14  --   --   --   --   --  49.08  --  39.56  --   --   WBC  --  25.9* 22.5*  --   --    < >  24.1*   < > 29.3*  --  30.3*  --  32.5* 32.6*  LATICACIDVEN 4.3*  --  2.8*  --  2.8*  --  3.3*  --   --   --   --   --   --   --    < > = values in this interval not displayed.     Liver Function Tests: Recent Labs  Lab 05/31/21 0835 06/01/21 0404 06/01/21 1325 06/01/21 1937 06/03/21 1416 06/04/21 0016 06/04/21 0508 06/05/21 0410 06/05/21 1610 06/06/21 0420 06/06/21 1530 06/07/21 0435  AST 24  --  101*  --  21 18  --   --   --   --   --   --   ALT 33  --  78*  --  42 39  --   --   --   --   --   --   ALKPHOS 70  --  68  --  91 89  --   --   --   --   --   --   BILITOT 0.6  --  1.1  --  2.2* 2.3*  --   --   --   --   --   --   PROT 6.3*  --  4.7*  --  4.4* 4.6*  --   --   --   --   --   --   ALBUMIN 2.9*   < > 2.0*   < > 1.7*   1.7* 1.8*   < > 1.7* 1.7* 1.7* 1.6* 1.7*   < > = values in this interval not displayed.    Recent Labs  Lab 05/31/21 0835  LIPASE 29    Recent Labs  Lab 06/04/21 0016  AMMONIA 23     ABG    Component Value Date/Time   PHART 7.35 06/06/2021 1038   PCO2ART 46 06/06/2021 1038   PO2ART 124 (H) 06/06/2021 1038   HCO3 25.4 06/06/2021 1038   TCO2 22 06/24/2007 0045   ACIDBASEDEF 0.3 06/06/2021 1038   O2SAT 98.6 06/06/2021 1038      Coagulation Profile: Recent Labs  Lab 05/31/21 0835 06/01/21 1715 06/04/21 0234  INR 1.3* 2.3* 1.3*     Cardiac Enzymes: Recent Labs  Lab 05/31/21 1114  CKTOTAL 411*     HbA1C: Hemoglobin-A1c  Date/Time Value Ref Range Status  06/26/2007 06:40 AM   Final   17.6% HIGH (NOTE) Reference Intervals:  Diabetic Adult        <9.0 Healthy  Adult         3.9 - 7.3 Current ADA guidelines recommend a treatment goal of <7.0% HgbA1c for diabetic patients, which corresponds to a <9.0% Glycohemoglobin result with this method.   Performed at:  [facility]               [place], [place]  [id number]   Hemoglobin A1C  Date/Time Value Ref Range Status  08/05/2013 01:26 PM 12.9 (H) 4.2 - 6.3 % Final    Comment:    The American Diabetes Association recommends that a primary goal of therapy should be <7% and that physicians should reevaluate the treatment regimen in patients with HbA1c values consistently >8%.    Hgb A1c MFr Bld  Date/Time Value Ref Range Status  05/31/2021 12:05 PM 5.7 (H) 4.8 - 5.6 % Final    Comment:  RESULTS CONFIRMED BY MANUAL DILUTION REPEATED TO VERIFY (NOTE) Pre diabetes:          5.7%-6.4%  Diabetes:              >6.4%  Glycemic control for   <7.0% adults with diabetes   06/11/2020 03:41 AM 7.0 (H) 4.8 - 5.6 % Final    Comment:    (NOTE) Pre diabetes:          5.7%-6.4%  Diabetes:              >6.4%  Glycemic control for   <7.0% adults with diabetes     CBG: Recent Labs  Lab 06/06/21 1123 06/06/21 1526 06/06/21 1946 06/07/21 0028 06/07/21 0353  GLUCAP 93 121* 86 201* 170*    Home Medications  Prior to Admission medications   Medication Sig Start Date End Date Taking? Authorizing Provider  ondansetron (ZOFRAN) 4 MG tablet Take 1 tablet (4 mg total) by mouth every 8 (eight) hours as needed for up to 10 doses for nausea or vomiting. 05/31/21  Yes Lucrezia Starch, MD  amLODipine (NORVASC) 10 MG tablet Take 1 tablet (10 mg total) by mouth daily. Patient not taking: Reported on 06/01/2021 06/14/20   Fritzi Mandes, MD  hydrALAZINE (APRESOLINE) 50 MG tablet Take 1 tablet (50 mg total) by mouth 2 (two) times daily. Patient not taking: Reported on 06/01/2021 06/13/20   Fritzi Mandes, MD  insulin glargine (LANTUS) 100 UNIT/ML injection Inject 20 Units into the skin at  bedtime. Patient not taking: Reported on 06/01/2021 08/26/20   [provider]  omeprazole (PRILOSEC) 40 MG capsule Take 1 capsule (40 mg total) by mouth daily. 03/08/20 05/07/20  Arta Silence, MD  pantoprazole (PROTONIX) 40 MG tablet Take 1 tablet (40 mg total) by mouth daily. Patient not taking: Reported on 12/10/2018 09/10/18 01/02/20  Hillary Bow, MD  sucralfate (CARAFATE) 1 g tablet Take 1 tablet (1 g total) by mouth 4 (four) times daily. Patient not taking: Reported on 06/26/2019 02/04/19 01/02/20  Nance Pear, MD      DVT/GI PRX  assessed I Assessed the need for Labs I Assessed the need for Foley I Assessed the need for Central Venous Line Family Discussion when available I Assessed the need for Mobilization I made an Assessment of medications to be adjusted accordingly Safety Risk assessment completed  CASE DISCUSSED IN MULTIDISCIPLINARY ROUNDS WITH ICU TEAM     Critical Care Time devoted to patient care services described in this note is 55 minutes.  Critical care was necessary to treat /prevent imminent and life-threatening deterioration. Overall, patient is critically ill, prognosis is guarded.  Patient with Multiorgan failure and at high risk for cardiac arrest and death.    Corrin Parker, M.D.  [facility]  Medical Director [place] Director [facility] Cardio-Pulmonary Department

## 2021-06-07 NOTE — Progress Notes (Signed)
Hematology/Oncology Progress Note  [phone] [phone]  Patient Care Team: Center, [facility] as PCP - General (General Practice)   Name of the patient: Jonathan Mcmillan  [id number]  1995/12/21  Date of visit: 06/07/21   INTERVAL HISTORY-  Remains  intubated.   Platelets pertinent labs, 9000 today.  No active bleeding.  Status post platelet transfusion.   Review of systems- Review of Systems  Unable to perform ROS: Intubated   No Known Allergies  Patient Active Problem List   Diagnosis Date Noted   Acute respiratory failure with hypoxia ([place])    Pressure injury of skin 06/01/2021   Hypotension    Fever    Cardiopulmonary arrest ([place])    Type 2 diabetes mellitus with hypoglycemia without coma, without long-term current use of insulin ([place]) 05/31/2021   Nicotine dependence 05/31/2021   Anemia of chronic disease 05/31/2021   Acute kidney injury superimposed on CKD ([place]) 06/11/2020   Nausea    Polysubstance abuse ([place])    AKI (acute kidney injury) ([place]) 06/10/2020   Abdominal pain 09/09/2018   Right arm cellulitis 01/26/2018   Sepsis ([place]) 03/06/2017   Malnutrition of moderate degree 05/22/2016   DKA (diabetic ketoacidoses) 05/21/2016   DM (diabetes mellitus), 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[place]) 77/93/9030   Hypertension 10/05/2010   Obesity 10/05/2010     Past Medical History:  Diagnosis Date   Cannabinoid hyperemesis syndrome    Diabetes 1.5, managed as type 1 ([place])    Gastroparesis    Perirectal abscess 06/16/2017     Past Surgical History:  Procedure Laterality Date   INCISION AND DRAINAGE PERIRECTAL ABSCESS N/A 06/16/2017   Procedure: IRRIGATION AND DEBRIDEMENT PERIRECTAL ABSCESS;  Surgeon: Florene Glen, MD;  Location: [facility] ORS;  Service: General;  Laterality: N/A;   none      Social History   Socioeconomic History   Marital status: Single    Spouse name: Not on file   Number of children: Not on file   Years  of education: Not on file   Highest education level: Not on file  Occupational History   Occupation: unemployed  Tobacco Use   Smoking status: Every Day    Packs/day: 0.50    Types: Cigarettes   Smokeless tobacco: Never  Vaping Use   Vaping Use: Never used  Substance and Sexual Activity   Alcohol use: No   Drug use: Yes    Types: Marijuana   Sexual activity: Not on file  Other Topics Concern   Not on file  Social History Narrative   Not on file   Social Determinants of Health   Financial Resource Strain: Not on file  Food Insecurity: Not on file  Transportation Needs: Not on file  Physical Activity: Not on file  Stress: Not on file  Social Connections: Not on file  Intimate Partner Violence: Not on file     Family History  Problem Relation Age of Onset   Diabetes Mother      Current Facility-Administered Medications:     prismasol BGK 4/2.5 infusion, , CRRT, Continuous, Candiss Norse, Harmeet, MD, Last Rate: 300 mL/hr at 06/07/21 1133, New Bag at 06/07/21 1133    prismasol BGK 4/2.5 infusion, , CRRT, Continuous, Singh, Harmeet, MD, Last Rate: 300 mL/hr at 06/07/21 1133, New Bag at 06/07/21 1133   0.9 %  sodium chloride infusion (Manually program via Guardrails IV Fluids), , Intravenous, Once, Lang Snow, NP, Held at 06/04/21 0247   albuterol (PROVENTIL) (  2.5 MG/3ML) 0.083% nebulizer solution 2.5 mg, 2.5 mg, Nebulization, Q6H, Schertz, Michele Mcalpine, MD, 2.5 mg at 06/07/21 0710   [COMPLETED] amiodarone (NEXTERONE) 1.8 mg/mL load via infusion 150 mg, 150 mg, Intravenous, Once, 150 mg at 06/05/21 1100 **FOLLOWED BY** [EXPIRED] amiodarone (NEXTERONE PREMIX) 360-4.14 MG/200ML-% (1.8 mg/mL) IV infusion, 60 mg/hr, Intravenous, Continuous, Last Rate: 33.3 mL/hr at 06/05/21 1700, 60 mg/hr at 06/05/21 1700 **FOLLOWED BY** amiodarone (NEXTERONE PREMIX) 360-4.14 MG/200ML-% (1.8 mg/mL) IV infusion, 30 mg/hr, Intravenous, Continuous, Scoggins, Amber, NP, Last Rate: 16.67 mL/hr at  06/07/21 1200, 30 mg/hr at 06/07/21 1200   ceFEPIme (MAXIPIME) 2 g in sodium chloride 0.9 % 100 mL IVPB, 2 g, Intravenous, Q12H, Ellington, Abby K, RPH, Last Rate: 200 mL/hr at 06/07/21 1200, 2 g at 06/07/21 1200   chlorhexidine gluconate (MEDLINE KIT) (PERIDEX) 0.12 % solution 15 mL, 15 mL, Mouth Rinse, BID, Jonnie Finner, Michele Mcalpine, MD, 15 mL at 06/07/21 0737   Chlorhexidine Gluconate Cloth 2 % PADS 6 each, 6 each, Topical, Daily, Wieting, Richard, MD, 6 each at 06/06/21 0912   dexmedetomidine (PRECEDEX) 400 MCG/100ML (4 mcg/mL) infusion, 0.4-1.2 mcg/kg/hr, Intravenous, Titrated, Kasa, Kurian, MD, Last Rate: 10.19 mL/hr at 06/07/21 1200, 0.6 mcg/kg/hr at 06/07/21 1200   docusate (COLACE) 50 MG/5ML liquid 100 mg, 100 mg, Per Tube, BID, Darel Hong D, NP, 100 mg at 06/06/21 2207   feeding supplement (PROSource TF) liquid 45 mL, 45 mL, Per Tube, TID, Mortimer Fries, Kurian, MD, 45 mL at 06/07/21 0930   feeding supplement (VITAL 1.5 CAL) liquid 1,000 mL, 1,000 mL, Per Tube, Continuous, Kasa, Kurian, MD, Last Rate: 50 mL/hr at 06/07/21 1255, Rate Change at 06/07/21 1255   fentaNYL (SUBLIMAZE) bolus via infusion 50-100 mcg, 50-100 mcg, Intravenous, Q1H PRN, Lang Snow, NP   fentaNYL 2516mcg in NS 234mL (21mcg/ml) infusion-PREMIX, 50-200 mcg/hr, Intravenous, Continuous, Ouma, Bing Neighbors, NP, Last Rate: 17.5 mL/hr at 06/07/21 1200, 175 mcg/hr at 06/07/21 1200   free water 30 mL, 30 mL, Per Tube, Q4H, Kasa, Kurian, MD, 30 mL at 06/07/21 1215   guaiFENesin-dextromethorphan (ROBITUSSIN DM) 100-10 MG/5ML syrup 10 mL, 10 mL, Oral, Q6H PRN, Agbata, Tochukwu, MD, 10 mL at 05/31/21 1652   heparin injection 1,000-6,000 Units, 1,000-6,000 Units, CRRT, PRN, Kolluru, Sarath, MD, 1,800 Units at 06/05/21 1013   hydrocortisone sodium succinate (SOLU-CORTEF) 100 MG injection 50 mg, 50 mg, Intravenous, Q6H, Schertz, Michele Mcalpine, MD, 50 mg at 06/07/21 0931   insulin aspart (novoLOG) injection 3-9 Units, 3-9 Units,  Subcutaneous, Q4H, Kasa, Kurian, MD, 6 Units at 06/07/21 1140   ipratropium-albuterol (DUONEB) 0.5-2.5 (3) MG/3ML nebulizer solution 3 mL, 3 mL, Nebulization, Q6H PRN, Ouma, Bing Neighbors, NP   lidocaine (LIDODERM) 5 % 1 patch, 1 patch, Transdermal, Q24H, Ouma, Bing Neighbors, NP, 1 patch at 06/03/21 2143   lip balm (BLISTEX) ointment, , Topical, PRN, Lang Snow, NP   MEDLINE mouth rinse, 15 mL, Mouth Rinse, 10 times per day, Bennie Pierini, MD, 15 mL at 06/07/21 1140   metoCLOPramide (REGLAN) injection 5 mg, 5 mg, Intravenous, Q8H, Kasa, Kurian, MD, 5 mg at 06/07/21 0600   midodrine (PROAMATINE) tablet 10 mg, 10 mg, Per Tube, TID WC, Kasa, Kurian, MD, 10 mg at 06/07/21 1140   multivitamin (RENA-VIT) tablet 1 tablet, 1 tablet, Per Tube, QHS, Nelle Don, MD, 1 tablet at 06/06/21 2208   norepinephrine (LEVOPHED) 16 mg in 225mL premix infusion, 0-40 mcg/min, Intravenous, Titrated, Bennie Pierini, MD, Last Rate: 2.81 mL/hr at 06/07/21 1200, 3 mcg/min  at 06/07/21 1200   nutrition supplement (JUVEN) (JUVEN) powder packet 1 packet, 1 packet, Per Tube, BID BM, Nelle Don, MD, 1 packet at 06/07/21 0930   ondansetron (ZOFRAN) tablet 4 mg, 4 mg, Oral, Q6H PRN **OR** ondansetron (ZOFRAN) injection 4 mg, 4 mg, Intravenous, Q6H PRN, Agbata, Tochukwu, MD   oxyCODONE (Oxy IR/ROXICODONE) immediate release tablet 5 mg, 5 mg, Oral, Q4H PRN, Jonnie Finner, Michele Mcalpine, MD   pantoprazole sodium (PROTONIX) 40 mg/20 mL oral suspension 40 mg, 40 mg, Per Tube, Daily, Kasa, Kurian, MD, 40 mg at 06/07/21 0931   phenylephrine CONCENTRATED 100mg  in sodium chloride 0.9% 257mL (0.4mg /mL) premix infusion, 0-400 mcg/min, Intravenous, Titrated, Kasa, Kurian, MD, Last Rate: 45 mL/hr at 06/07/21 1200, 300 mcg/min at 06/07/21 1200   polyethylene glycol (MIRALAX / GLYCOLAX) packet 17 g, 17 g, Per Tube, Daily, Darel Hong D, NP   polyvinyl alcohol (LIQUIFILM TEARS) 1.4 % ophthalmic solution, , Both Eyes,  QHS PRN, Lang Snow, NP, Given at 06/07/21 0125   prismasol BGK 2/2.5 dialysis solution, , CRRT, Continuous, Kolluru, Sarath, MD, Stopped at 06/03/21 1756   prismasol BGK 2/2.5 replacement solution, , CRRT, Continuous, Kolluru, Sarath, MD, Stopped at 06/03/21 [place] 2/2.5 replacement solution, , CRRT, Continuous, Kolluru, Sarath, MD, Stopped at 06/03/21 1756   prismasol BGK 4/2.5 infusion, , CRRT, Continuous, Singh, Harmeet, MD, Last Rate: 1,200 mL/hr at 06/07/21 1053, New Bag at 06/07/21 1053   propofol (DIPRIVAN) 1000 MG/100ML infusion, 0-50 mcg/kg/min, Intravenous, Continuous, Ouma, Bing Neighbors, NP, Stopped at 06/07/21 1038   sodium chloride 0.9 % primer fluid for CRRT, , CRRT, PRN, Kolluru, Sarath, MD   vasopressin (PITRESSIN) 20 Units in sodium chloride 0.9 % 100 mL infusion-*FOR SHOCK*, 0-0.03 Units/min, Intravenous, Continuous, Bennie Pierini, MD, Last Rate: 9 mL/hr at 06/07/21 1200, 0.03 Units/min at 06/07/21 1200   Physical exam:  Vitals:   06/07/21 1119 06/07/21 1129 06/07/21 1130 06/07/21 1200  BP:   112/67 107/65  Pulse:      Resp:    13  Temp:   98.4 F (36.9 C) 98.4 F (36.9 C)  TempSrc:    Esophageal  SpO2: 100% 100%  100%  Weight:      Height:       Physical Exam Constitutional:      Comments: Intubated  HENT:     Head: Normocephalic.  Pulmonary:     Comments: Intubated Abdominal:     Palpations: Abdomen is soft.  Musculoskeletal:     Cervical back: Neck supple.     Right lower leg: No edema.     Left lower leg: No edema.  Skin:    General: Skin is warm.  Neurological:     Comments: Eyes are open, track  for short period of time.        CMP Latest Ref Rng & Units 06/07/2021  Glucose 70 - 99 mg/dL 196(H)  BUN 6 - 20 mg/dL 20  Creatinine 0.61 - 1.24 mg/dL 1.05  Sodium 135 - 145 mmol/L 136  Potassium 3.5 - 5.1 mmol/L 3.7  Chloride 98 - 111 mmol/L 105  CO2 22 - 32 mmol/L 24  Calcium 8.9 - 10.3 mg/dL 7.2(L)  Total  Protein 6.5 - 8.1 g/dL -  Total Bilirubin 0.3 - 1.2 mg/dL -  Alkaline Phos 38 - 126 U/L -  AST 15 - 41 U/L -  ALT 0 - 44 U/L -   CBC Latest Ref Rng & Units 06/07/2021  WBC 4.0 -  10.5 K/uL 32.6(H)  Hemoglobin 13.0 - 17.0 g/dL 7.8(L)  Hematocrit 39.0 - 52.0 % 22.7(L)  Platelets 150 - 400 K/uL 9(LL)    RADIOGRAPHIC STUDIES: I have personally reviewed the radiological images as listed and agreed with the findings in the report. DG Chest 1 View  Result Date: 06/03/2021 CLINICAL DATA:  Post intubation EXAM: CHEST  1 VIEW COMPARISON:  06/02/2021, 05/31/2021, 03/08/2020 FINDINGS: Tip of the endotracheal tube is about 3.3 cm superior to carina. Cardiomegaly with bilateral effusions and worsened basilar airspace disease. Vascular congestion. Esophageal tube has been removed. IMPRESSION: 1. Endotracheal tube tip about 3.3 cm superior to carina. 2. Cardiomegaly with interim development of bilateral effusions and basilar airspace disease. Vascular congestion and mild edema. Overall aeration is worse as compared with 06/02/2021. Electronically Signed   By: Donavan Foil M.D.   On: 06/03/2021 23:24   DG Chest 2 View  Result Date: 05/31/2021 CLINICAL DATA:  Cough.  Fever. EXAM: CHEST - 2 VIEW COMPARISON:  Multiple chest XRs, most recently 03/08/2020. CT chest, 01/02/2020. FINDINGS: Cardiomediastinal silhouette is within normal limits given positioning and technique. Lungs are well inflated. No focal consolidation or mass. No pleural effusion or pneumothorax. No acute displaced fracture. IMPRESSION: No active cardiopulmonary disease. Electronically Signed   By: Michaelle Birks M.D.   On: 05/31/2021 07:34   DG Abd 1 View  Result Date: 06/04/2021 CLINICAL DATA:  OG tube. EXAM: ABDOMEN - 1 VIEW COMPARISON:  CT CAP, 06/03/2021.  KUB, 06/03/2021 and 06/01/2021. FINDINGS: Support lines: Enteric tube with tip and side port within stomach. RIGHT femoral dialysis catheter, incompletely imaged. Overlying pacer leads. The  bowel gas pattern is normal.  No interval osseous abnormality. IMPRESSION: 1. Well-positioned enteric feeding tube, with tip and side port within stomach. Additional lines and tubes as above. 2. Nonobstructed bowel-gas. Electronically Signed   By: Michaelle Birks M.D.   On: 06/04/2021 08:39   DG Abd 1 View  Result Date: 06/03/2021 CLINICAL DATA:  Distension EXAM: ABDOMEN - 1 VIEW COMPARISON:  06/01/2021 FINDINGS: Nonobstructed gas pattern with paucity of bowel gas. Bilateral femoral catheters, right catheter tip over lies the right aspect of the L2 vertebral body. Left catheter tip overlies the upper aspect of left sacrum. IMPRESSION: Nonobstructed gas pattern with overall paucity of bowel gas. Esophageal tube has been removed. Electronically Signed   By: Donavan Foil M.D.   On: 06/03/2021 23:25   DG Abd 1 View  Result Date: 06/01/2021 CLINICAL DATA:  Encounter for feeding tube placement EXAM: ABDOMEN - 1 VIEW COMPARISON:  X-ray abdomen 06/11/2020. FINDINGS: Enteric tube coursing below the hemidiaphragm with tip and side port overlying the gastric lumen. Tip likely in the region of the pylorus/first portion of the duodenum. Bilateral femoral approach catheters noted with left catheter overlying the L5 vertebral body and right catheter with tip overlying the right aspect of the L1 vertebral body. Nonobstructive bowel gas pattern. Slight distension of the small bowel with gas. No radio-opaque calculi or other significant radiographic abnormality are seen. IMPRESSION: Enteric tube coursing below within the gastric lumen with tip overlying the expected region of the pylorus. Consider retracting by 3 cm. Electronically Signed   By: Iven Finn M.D.   On: 06/01/2021 18:11   CT HEAD WO CONTRAST (5MM)  Result Date: 06/04/2021 CLINICAL DATA:  Initial evaluation for neuro deficit, stroke suspected. EXAM: CT HEAD WITHOUT CONTRAST TECHNIQUE: Contiguous axial images were obtained from the base of the skull through  the vertex without intravenous contrast.  RADIATION DOSE REDUCTION: This exam was performed according to the departmental dose-optimization program which includes automated exposure control, adjustment of the mA and/or kV according to patient size and/or use of iterative reconstruction technique. COMPARISON:  Prior CT from 06/01/2021. FINDINGS: Brain: Cerebral volume stable, and remains within normal limits. No acute intracranial hemorrhage. No acute large vessel territory infarct. No mass lesion or midline shift. No hydrocephalus or extra-axial fluid collection. Vascular: No hyperdense vessel. Skull: Scalp soft tissues demonstrate no new abnormality. Suspected anasarca. Calvarium intact. Sinuses/Orbits: Globes and orbital soft tissues demonstrate no acute finding. Scattered mucosal thickening noted about the sphenoid ethmoidal and maxillary sinuses. Few scattered superimposed small air-fluid levels noted. Trace bilateral mastoid effusions. Other: None. IMPRESSION: 1. Stable head CT with no acute intracranial abnormality. 2. Scattered paranasal sinus disease with superimposed small air-fluid levels, suggesting acute sinusitis. Associated trace mastoid effusions. Appearance is similar to previous. Electronically Signed   By: Jeannine Boga M.D.   On: 06/04/2021 01:14   CT HEAD WO CONTRAST (5MM)  Result Date: 06/01/2021 CLINICAL DATA:  Altered mental status EXAM: CT HEAD WITHOUT CONTRAST TECHNIQUE: Contiguous axial images were obtained from the base of the skull through the vertex without intravenous contrast. RADIATION DOSE REDUCTION: This exam was performed according to the departmental dose-optimization program which includes automated exposure control, adjustment of the mA and/or kV according to patient size and/or use of iterative reconstruction technique. COMPARISON:  05/21/2016 FINDINGS: Brain: No acute intracranial findings are seen. There are no signs of bleeding within the cranium. Ventricles are  not dilated. There is no shift of midline structures. There are no epidural or subdural fluid collections. Vascular: Unremarkable. Skull: Unremarkable. Sinuses/Orbits: Air-fluid level is seen in the sphenoid sinus. There is mucosal thickening in the ethmoid and maxillary sinuses. Small air-fluid levels are seen in both maxillary sinuses. Other: None IMPRESSION: No acute intracranial findings are seen in noncontrast CT brain. Chronic sinusitis. Air-fluid levels are seen in the sphenoid and maxillary sinuses suggesting possible acute sinusitis. Electronically Signed   By: Elmer Picker M.D.   On: 06/01/2021 17:03   CT CHEST WO CONTRAST  Result Date: 05/31/2021 CLINICAL DATA:  Body aches, chills, nausea since Sunday. EXAM: CT CHEST WITHOUT CONTRAST TECHNIQUE: Multidetector CT imaging of the chest was performed following the standard protocol without IV contrast. RADIATION DOSE REDUCTION: This exam was performed according to the departmental dose-optimization program which includes automated exposure control, adjustment of the mA and/or kV according to patient size and/or use of iterative reconstruction technique. COMPARISON:  Same day chest radiograph, CT chest 01/02/2020 FINDINGS: Cardiovascular: The heart is not enlarged. There is trace pericardial fluid. The blood pool is hypodense suggesting anemia. The vasculature is unremarkable, as can be assessed in the absence of intravenous contrast. Mediastinum/Nodes: The thyroid is unremarkable. The esophagus is grossly unremarkable. There is no mediastinal or axillary lymphadenopathy. There is no bulky hilar adenopathy, suboptimally evaluated in the absence of intravenous contrast. Lungs/Pleura: The trachea and central airways are patent. The lungs are well inflated. There is no focal consolidation or pulmonary edema. There is no pleural effusion or pneumothorax. Upper Abdomen: The imaged portions of the upper abdominal viscera are unremarkable. Musculoskeletal:  Multiple remote right-sided rib fractures are seen. There is no acute osseous abnormality or aggressive osseous lesion. Other: There is mild diffuse body wall edema. IMPRESSION: 1. Trace pericardial effusion and mild diffuse body wall edema, new since 01/02/2020. 2. Clear lungs, with no focal consolidation or pleural effusion. 3. Hypodense blood pool consistent  with anemia. Electronically Signed   By: Valetta Mole M.D.   On: 05/31/2021 12:08   US RENAL  Result Date: 05/31/2021 CLINICAL DATA:  Acute kidney injury EXAM: RENAL / URINARY TRACT ULTRASOUND COMPLETE COMPARISON:  06/10/2020 FINDINGS: Right Kidney: Renal measurements: 10.9 x 6.1 x 7.0 cm = volume: 244 mL. Increased cortical echogenicity with medical renal disease. Trace retroperitoneal perinephric edema. No hydronephrosis focal abnormality. Left Kidney: Renal measurements: 11.1 x 6.9 x 6.5 cm = volume: 257 mL. Similar increased cortical echogenicity compatible with medical renal disease. Similar trace retroperitoneal perinephric fluid/edema. No hydronephrosis. No focal abnormality. Bladder: Nearly collapsed, accounting for wall prominence Other: None. IMPRESSION: Increased renal echogenicity compatible with medical renal disease. Retroperitoneal perinephric edema suspect related to volume overload. Electronically Signed   By: Jerilynn Mages.  Shick M.D.   On: 05/31/2021 14:01   US Venous Img Lower Bilateral (DVT)  Result Date: 06/04/2021 CLINICAL DATA:  Initial evaluation for bilateral lower extremity swelling. EXAM: BILATERAL LOWER EXTREMITY VENOUS DOPPLER ULTRASOUND TECHNIQUE: Gray-scale sonography with graded compression, as well as color Doppler and duplex ultrasound were performed to evaluate the lower extremity deep venous systems from the level of the common femoral vein and including the common femoral, femoral, profunda femoral, popliteal and calf veins including the posterior tibial, peroneal and gastrocnemius veins when visible. The superficial great  saphenous vein was also interrogated. Spectral Doppler was utilized to evaluate flow at rest and with distal augmentation maneuvers in the common femoral, femoral and popliteal veins. COMPARISON:  None. FINDINGS: RIGHT LOWER EXTREMITY Common Femoral Vein: No evidence of thrombus. Normal compressibility, respiratory phasicity and response to augmentation. Saphenofemoral Junction: No evidence of thrombus. Normal compressibility and flow on color Doppler imaging. Profunda Femoral Vein: No evidence of thrombus. Normal compressibility and flow on color Doppler imaging. Femoral Vein: No evidence of thrombus. Normal compressibility, respiratory phasicity and response to augmentation. Popliteal Vein: No evidence of thrombus. Normal compressibility, respiratory phasicity and response to augmentation. Calf Veins: No evidence of thrombus. Normal compressibility and flow on color Doppler imaging. Superficial Great Saphenous Vein: No evidence of thrombus. Normal compressibility. Venous Reflux:  None. Other Findings:  None. LEFT LOWER EXTREMITY Common Femoral Vein: Not seen, obscured by overlying bandaging material. Saphenofemoral Junction: Not seen, obscured by overlying bandaging material. Profunda Femoral Vein: Not seen, obscured by overlying bandaging material. Femoral Vein: No evidence of thrombus. Normal compressibility, respiratory phasicity and response to augmentation. Popliteal Vein: No evidence of thrombus. Normal compressibility, respiratory phasicity and response to augmentation. Calf Veins: No evidence of thrombus. Normal compressibility and flow on color Doppler imaging. Superficial Great Saphenous Vein: No evidence of thrombus. Normal compressibility. Venous Reflux:  None. Other Findings:  None. IMPRESSION: 1. Nonvisualization of the left common femoral vein, saphenofemoral junction, and profunda femoral vein, obscured by overlying bandaging material. 2. Otherwise no evidence of deep venous thrombosis in either  lower extremity. Electronically Signed   By: Jeannine Boga M.D.   On: 06/04/2021 05:30   DG Chest Port 1 View  Result Date: 06/02/2021 CLINICAL DATA:  Acute respiratory failure, hypoxia EXAM: PORTABLE CHEST 1 VIEW COMPARISON:  Chest radiograph 1 day prior FINDINGS: The endotracheal tube is approximately 2.6 cm from the carina. The enteric catheter tip is off the field of view. A presumed esophageal temperature probe terminates in the upper thorax. The cardiac silhouette is enlarged, unchanged. The mediastinal contours are stable. There are patchy opacities in the lung bases, particularly on the right, likely not significantly changed in the interim. There is  no significant pleural effusion. There is no pneumothorax. There is no acute osseous abnormality. IMPRESSION: 1. Unchanged enlargement of the cardiac silhouette. 2. Patchy bibasilar opacities are not significantly changed and may reflect atelectasis or developing infection. 3. Presumed esophageal temperature probe terminating in the upper thorax. Endotracheal tube in satisfactory position. Enteric catheter tip is off the field of view. Electronically Signed   By: Valetta Mole M.D.   On: 06/02/2021 07:25   DG Chest Port 1 View  Result Date: 06/01/2021 CLINICAL DATA:  Intubation. EXAM: PORTABLE CHEST 1 VIEW COMPARISON:  Chest radiographs and CT 05/31/2021 FINDINGS: An endotracheal tube has been placed and terminates proximally 2.5 cm above the carina. Telemetry leads overlie the chest. The cardiac silhouette is accentuated by portable AP technique although true mild interval enlargement, such as from a pericardial effusion, is not excluded. Lung volumes are lower than on the prior radiographs, and there are mild opacities in both lung bases. No sizable pleural effusion or pneumothorax is identified. IMPRESSION: 1. Endotracheal tube in appropriate position. 2. Low lung volumes with mild bibasilar opacities which may reflect atelectasis or infection.  Electronically Signed   By: Logan Bores M.D.   On: 06/01/2021 13:56   ECHOCARDIOGRAM COMPLETE  Result Date: 06/01/2021    ECHOCARDIOGRAM REPORT   Patient Name:   DECKLYN HYDER Date of Exam: 06/01/2021 Medical Rec #:  [id number]       Height:       68.0 in Accession #:    [id number]      Weight:       145.0 lb Date of Birth:  12/29/1995       BSA:          1.783 m Patient Age:    25 years        BP:           155/83 mmHg Patient Gender: M               HR:           95 bpm. Exam Location:  [facility] Procedure: 2D Echo, Color Doppler, Cardiac Doppler and Strain Analysis Indications:     I46.9 Cardiac arrest  History:         Patient has no prior history of Echocardiogram examinations.                  ESRD, stage IV, Signs/Symptoms:Fever; Risk Factors:Diabetes.  Sonographer:     Charmayne Sheer Referring Phys:  [id number] Bradly Bienenstock Diagnosing Phys: Ida Rogue MD  Sonographer Comments: Echo performed with patient supine and on artificial respirator. Global longitudinal strain was attempted. IMPRESSIONS  1. Left ventricular ejection fraction, by estimation, is 35 to 40%. The left ventricle has moderately decreased function. The left ventricle demonstrates global hypokinesis. There is moderate left ventricular hypertrophy. Left ventricular diastolic parameters are consistent with Grade II diastolic dysfunction (pseudonormalization). The average left ventricular global longitudinal strain is -11.3 %. The global longitudinal strain is abnormal.  2. Right ventricular systolic function is mildly reduced. The right ventricular size is normal. There is moderately elevated pulmonary artery systolic pressure. The estimated right ventricular systolic pressure is 30.8 mmHg.  3. Left atrial size was mildly dilated.  4. The mitral valve is normal in structure. Mild mitral valve regurgitation. No evidence of mitral stenosis.  5. The aortic valve is normal in structure. Aortic valve regurgitation is mild. No aortic stenosis is  present.  6. The inferior vena cava is dilated in  size with <50% respiratory variability, suggesting right atrial pressure of 15 mmHg. FINDINGS  Left Ventricle: Left ventricular ejection fraction, by estimation, is 35 to 40%. The left ventricle has moderately decreased function. The left ventricle demonstrates global hypokinesis. The average left ventricular global longitudinal strain is -11.3 %. The global longitudinal strain is abnormal. The left ventricular internal cavity size was normal in size. There is moderate left ventricular hypertrophy. Left ventricular diastolic parameters are consistent with Grade II diastolic dysfunction (pseudonormalization). Right Ventricle: The right ventricular size is normal. No increase in right ventricular wall thickness. Right ventricular systolic function is mildly reduced. There is moderately elevated pulmonary artery systolic pressure. The tricuspid regurgitant velocity is 2.80 m/s, and with an assumed right atrial pressure of 15 mmHg, the estimated right ventricular systolic pressure is 27.6 mmHg. Left Atrium: Left atrial size was mildly dilated. Right Atrium: Right atrial size was normal in size. Pericardium: There is no evidence of pericardial effusion. Mitral Valve: The mitral valve is normal in structure. Mild mitral valve regurgitation. No evidence of mitral valve stenosis. MV peak gradient, 5.4 mmHg. The mean mitral valve gradient is 3.0 mmHg. Tricuspid Valve: The tricuspid valve is normal in structure. Tricuspid valve regurgitation is mild . No evidence of tricuspid stenosis. Aortic Valve: The aortic valve is normal in structure. Aortic valve regurgitation is mild. No aortic stenosis is present. Aortic valve mean gradient measures 6.0 mmHg. Aortic valve peak gradient measures 10.1 mmHg. Aortic valve area, by VTI measures 2.64  cm. Pulmonic Valve: The pulmonic valve was normal in structure. Pulmonic valve regurgitation is trivial. No evidence of pulmonic stenosis.  Aorta: The aortic root is normal in size and structure. Venous: The inferior vena cava is dilated in size with less than 50% respiratory variability, suggesting right atrial pressure of 15 mmHg. IAS/Shunts: No atrial level shunt detected by color flow Doppler.  LEFT VENTRICLE PLAX 2D LVIDd:         4.90 cm   Diastology LVIDs:         3.79 cm   LV e' medial:    8.59 cm/s LV PW:         1.43 cm   LV E/e' medial:  14.4 LV IVS:        0.93 cm   LV e' lateral:   9.46 cm/s LVOT diam:     2.10 cm   LV E/e' lateral: 13.1 LV SV:         58 LV SV Index:   33        2D Longitudinal Strain LVOT Area:     3.46 cm  2D Strain GLS Avg:     -11.3 %  RIGHT VENTRICLE RV Basal diam:  3.95 cm RV S prime:     11.40 cm/s LEFT ATRIUM             Index        RIGHT ATRIUM           Index LA diam:        4.40 cm 2.47 cm/m   RA Area:     20.10 cm LA Vol (A2C):   77.6 ml 43.53 ml/m  RA Volume:   60.00 ml  33.66 ml/m LA Vol (A4C):   73.9 ml 41.45 ml/m LA Biplane Vol: 79.4 ml 44.54 ml/m  AORTIC VALVE                     PULMONIC VALVE AV Area (Vmax):  2.40 cm      PV Vmax:       1.12 m/s AV Area (Vmean):   2.23 cm      PV Vmean:      82.500 cm/s AV Area (VTI):     2.64 cm      PV VTI:        0.187 m AV Vmax:           159.00 cm/s   PV Peak grad:  5.0 mmHg AV Vmean:          113.000 cm/s  PV Mean grad:  3.0 mmHg AV VTI:            0.220 m AV Peak Grad:      10.1 mmHg AV Mean Grad:      6.0 mmHg LVOT Vmax:         110.00 cm/s LVOT Vmean:        72.600 cm/s LVOT VTI:          0.168 m LVOT/AV VTI ratio: 0.76  AORTA Ao Root diam: 2.80 cm MITRAL VALVE                TRICUSPID VALVE MV Area (PHT): 5.16 cm     TR Peak grad:   31.4 mmHg MV Area VTI:   2.47 cm     TR Vmax:        280.00 cm/s MV Peak grad:  5.4 mmHg MV Mean grad:  3.0 mmHg     SHUNTS MV Vmax:       1.16 m/s     Systemic VTI:  0.17 m MV Vmean:      77.4 cm/s    Systemic Diam: 2.10 cm MV Decel Time: 147 msec MV E velocity: 124.00 cm/s MV A velocity: 54.40 cm/s MV E/A ratio:   2.28 Ida Rogue MD Electronically signed by Ida Rogue MD Signature Date/Time: 06/01/2021/6:41:43 PM    Final    ECHOCARDIOGRAM LIMITED  Result Date: 06/06/2021    ECHOCARDIOGRAM LIMITED REPORT   Patient Name:   KYRE JEFFRIES Date of Exam: 06/05/2021 Medical Rec #:  [id number]       Height:       68.0 in Accession #:    [id number]      Weight:       152.1 lb Date of Birth:  25-Feb-1996       BSA:          1.819 m Patient Age:    25 years        BP:           102/80 mmHg Patient Gender: M               HR:           56 bpm. Exam Location:  [facility] Procedure: 2D Echo, Color Doppler and Cardiac Doppler Indications:     CHF I50.9  History:         Patient has prior history of Echocardiogram examinations, most                  recent 06/01/2021. Risk Factors:Diabetes.  Sonographer:     Sherrie Sport Referring Phys:  [id number] ADAM ROSS SCHERTZ Diagnosing Phys: Ida Rogue MD  Sonographer Comments: Echo performed with patient supine and on artificial respirator. IMPRESSIONS  1. Left ventricular ejection fraction, by estimation, is 35 to 40%. The left ventricle has moderately decreased function. The left ventricle demonstrates global hypokinesis. There is  mild left ventricular hypertrophy. Left ventricular diastolic parameters are indeterminate.  2. Right ventricular systolic function is mildly reduced. The right ventricular size is normal. There is normal pulmonary artery systolic pressure. The estimated right ventricular systolic pressure is 97.0 mmHg.  3. The mitral valve is normal in structure. Mild mitral valve regurgitation. No evidence of mitral stenosis.  4. Tricuspid valve regurgitation is mild to moderate.  5. The aortic valve has an indeterminant number of cusps. Aortic valve regurgitation is mild. No aortic stenosis is present.  6. The inferior vena cava is normal in size with greater than 50% respiratory variability, suggesting right atrial pressure of 3 mmHg. FINDINGS  Left Ventricle: Left ventricular  ejection fraction, by estimation, is 35 to 40%. The left ventricle has moderately decreased function. The left ventricle demonstrates global hypokinesis. The left ventricular internal cavity size was normal in size. There is mild left ventricular hypertrophy. Left ventricular diastolic parameters are indeterminate. Right Ventricle: The right ventricular size is normal. No increase in right ventricular wall thickness. Right ventricular systolic function is mildly reduced. There is normal pulmonary artery systolic pressure. The tricuspid regurgitant velocity is 2.58 m/s, and with an assumed right atrial pressure of 5 mmHg, the estimated right ventricular systolic pressure is 26.3 mmHg. Left Atrium: Left atrial size was normal in size. Right Atrium: Right atrial size was normal in size. Pericardium: There is no evidence of pericardial effusion. Mitral Valve: The mitral valve is normal in structure. Mild mitral valve regurgitation. No evidence of mitral valve stenosis. Tricuspid Valve: The tricuspid valve is normal in structure. Tricuspid valve regurgitation is mild to moderate. No evidence of tricuspid stenosis. Aortic Valve: The aortic valve has an indeterminant number of cusps. Aortic valve regurgitation is mild. No aortic stenosis is present. Aortic valve mean gradient measures 3.5 mmHg. Aortic valve peak gradient measures 7.1 mmHg. Aortic valve area, by VTI measures 2.90 cm. Pulmonic Valve: The pulmonic valve was normal in structure. Pulmonic valve regurgitation is not visualized. No evidence of pulmonic stenosis. Aorta: The aortic root is normal in size and structure. Venous: The inferior vena cava is normal in size with greater than 50% respiratory variability, suggesting right atrial pressure of 3 mmHg. IAS/Shunts: No atrial level shunt detected by color flow Doppler. LEFT VENTRICLE PLAX 2D LVIDd:         4.60 cm LVIDs:         4.00 cm LV PW:         1.60 cm LV IVS:        0.90 cm LVOT diam:     2.20 cm LV SV:          39 LV SV Index:   21 LVOT Area:     3.80 cm  RIGHT VENTRICLE RV Basal diam:  3.90 cm RV S prime:     11.00 cm/s TAPSE (M-mode): 1.8 cm LEFT ATRIUM             Index        RIGHT ATRIUM           Index LA diam:        3.40 cm 1.87 cm/m   RA Area:     13.20 cm LA Vol (A2C):   49.2 ml 27.04 ml/m  RA Volume:   29.90 ml  16.43 ml/m LA Vol (A4C):   31.9 ml 17.53 ml/m LA Biplane Vol: 41.9 ml 23.03 ml/m  AORTIC VALVE  PULMONIC VALVE AV Area (Vmax):    2.38 cm     PV Vmax:        0.57 m/s AV Area (Vmean):   2.50 cm     PV Vmean:       35.800 cm/s AV Area (VTI):     2.90 cm     PV VTI:         0.060 m AV Vmax:           133.50 cm/s  PV Peak grad:   1.3 mmHg AV Vmean:          83.900 cm/s  PV Mean grad:   1.0 mmHg AV VTI:            0.134 m      RVOT Peak grad: 4 mmHg AV Peak Grad:      7.1 mmHg AV Mean Grad:      3.5 mmHg LVOT Vmax:         83.60 cm/s LVOT Vmean:        55.100 cm/s LVOT VTI:          0.102 m LVOT/AV VTI ratio: 0.76  AORTA Ao Root diam: 2.93 cm MITRAL VALVE                TRICUSPID VALVE MV Area (PHT): 3.65 cm     TR Peak grad:   26.6 mmHg MV Decel Time: 208 msec     TR Vmax:        258.00 cm/s MV E velocity: 101.00 cm/s                             SHUNTS                             Systemic VTI:  0.10 m                             Systemic Diam: 2.20 cm                             Pulmonic VTI:  0.115 m Ida Rogue MD Electronically signed by Ida Rogue MD Signature Date/Time: 06/06/2021/1:23:45 PM    Final    CT CHEST ABDOMEN PELVIS WO CONTRAST  Result Date: 06/04/2021 CLINICAL DATA:  Sepsis. EXAM: CT CHEST, ABDOMEN AND PELVIS WITHOUT CONTRAST TECHNIQUE: Multidetector CT imaging of the chest, abdomen and pelvis was performed following the standard protocol without IV contrast. RADIATION DOSE REDUCTION: This exam was performed according to the departmental dose-optimization program which includes automated exposure control, adjustment of the mA and/or kV according  to patient size and/or use of iterative reconstruction technique. COMPARISON:  Chest CT dated 05/31/2021. FINDINGS: Evaluation of this exam is limited in the absence of intravenous contrast. Evaluation is also limited due to streak artifact caused by patient's arms as well as anasarca. CT CHEST FINDINGS Cardiovascular: Top-normal cardiac size. No pericardial effusion. There is hypoattenuation of the cardiac blood pool suggestive of anemia. Clinical correlation is recommended. Mild atherosclerotic calcification of the aortic arch. The central pulmonary arteries are grossly unremarkable. Mediastinum/Nodes: Evaluation of the hilar and mediastinal structure is very limited due to factors above. No mediastinal fluid collection. Lungs/Pleura: Small bilateral pleural effusions. There are large areas of consolidative change involving the lower lobes, right greater  left. There is near complete consolidation of the right lower lobe. There is impaction of the right lower lobe bronchus as well as impaction of the left lower lobe bronchi which may represent aspiration. No pneumothorax. Tracheostomy with tip approximately 3 cm above the carina. Musculoskeletal: Diffuse chest wall edema and anasarca. No acute osseous pathology. Old healed right posterior rib fracture. CT ABDOMEN PELVIS FINDINGS No intra-abdominal free air.  Small ascites. Hepatobiliary: The liver is unremarkable. Mild periportal edema. Probable gallbladder sludge. No calcified gallstone. Pancreas: Unremarkable. No pancreatic ductal dilatation or surrounding inflammatory changes. Spleen: Normal in size without focal abnormality. Adrenals/Urinary Tract: The adrenal glands are unremarkable. There is no hydronephrosis or nephrolithiasis on either side. The urinary bladder is decompressed around a Foley catheter. Stomach/Bowel: A rectal tube is in place. No bowel obstruction. The appendix is unremarkable as visualized. Vascular/Lymphatic: The abdominal aorta and IVC  are grossly unremarkable. Right femoral approach venous line with tip in the IVC inferior to the renal vein. Left femoral approach venous line with tip in the left common iliac vein. Right femoral arterial line with tip in the right external iliac artery. No portal venous gas. No adenopathy. Reproductive: The prostate and seminal vesicles are grossly unremarkable. Other: Diffuse subcutaneous edema and anasarca. Musculoskeletal: No acute or significant osseous findings. IMPRESSION: 1. Impacted bilateral lower lobe bronchi with large areas of consolidation involving the lower lobes with near complete consolidation the right lower lobe, new since the prior CT. Findings likely represent postobstructive atelectasis or pneumonia. Aspiration is not excluded. Small bilateral pleural effusions also new since the prior CT. 2. Small ascites and anasarca. 3. Aortic Atherosclerosis (ICD10-I70.0). Electronically Signed   By: Anner Crete M.D.   On: 06/04/2021 00:07    Assessment and plan-   # severe thrombocytopenia Platelet count 9,000 today, no active bleeding.  Agree with transfusion of platelet count to keep platelet above 10,000. Etiology of thrombocytopenia is multifactorial, likely due to sepsis. ADAMTS 13 level is mildly decreased, likely due to sepsis. No other evidence of microangiopathic hemolysis. normal LDH, no reticulocytosis, normal haptoglobin. Schistocytes could be due to being on CRRT.  Fibrinogen was normal, less likely DIC. HIT panel was negative.  zosyn has been discontinued, no improvement of platelet counts,   # s/p Cardiac arrest, acute respiratory failure, Aspiration pneumonia, Septic shock, still on pressors.  Prognosis is poor. Managed by ICU.   Thank you for allowing me to participate in the care of this patient.   Earlie Server, MD, PhD 06/07/2021

## 2021-06-07 NOTE — Consult Note (Signed)
Consultation Note Date: 06/07/2021   Patient Name: Jonathan Mcmillan  DOB: 27-Apr-1996  MRN: [id number]  Age / Sex: 26 y.o., male  [facility] [place] Referring Physician: Flora Lipps, MD  Reason for Consultation: Establishing goals of care  HPI/Patient Profile: 26 y.o. male  with past medical history of diabetes, CKD stage 4, hypertension, perirectal abscess, gastroparesis admitted on 05/31/2021 with 3 days of fever, chills, nonproductive cough, myalgias, diarrhea. Complicated hospitalization with asystole cardiac arrest with ongoing need for vent and vasopressor support, CRRT, heart failure EF 35%, thrombocytopenia.   Clinical Assessment and Goals of Care: I met today at Jonathan Mcmillan's bedside after reviewing records and received update from RN. Jonathan Mcmillan is tolerating some weaning on ventilator and appears comfortable (but he is requiring some Precedex). RN reports that he has been more responsive and following some commands today. He was not as responsive or interactive during my visit. Requiring 3 vasopressors. CRRT running.   I called and spoke with aunt Tammy. Tammy shares that Jonathan Mcmillan lives with her and when I ask what Jonathan Mcmillan is all about she tells me "family." Tammy reports that they are and have always been very close as well as her sister, Channie. We reflected on Jonathan Mcmillan's illness and that he continues to be critically ill. Tammy has good understanding of the details of Jonathan Mcmillan's illness with heart failure, need for dialysis (expected to be long term), ventilator support, vasopressor support. She understands the severity of her illness. Tammy shares that their family is very hopeful after seeing him more responsive today. She shares that they continue to hope and pray for him to improve. We discussed that it is too soon to know if Jonathan Mcmillan's body will be able to have any recovery from this  severe illness. Tammy expresses understanding. Tammy plans to be back at the hospital tomorrow and we agree to meet in person tomorrow 06/08/21 1300. Tammy expresses appreciation of the care team and communication.   All questions/concerns addressed. Emotional support provided.   Primary Decision Maker NEXT OF KIN aunts Tammy and Channie    SUMMARY OF RECOMMENDATIONS   Continue full scope Family hopeful for some level of improvement  Code Status/Advance Care Planning: Full code   Symptom Management:  Per PCCM.   Palliative Prophylaxis:  Delirium Protocol, Frequent Pain Assessment, Oral Care, and Turn Reposition  Additional Recommendations (Limitations, Scope, Preferences): Full Scope Treatment  Psycho-social/Spiritual:  Desire for further Chaplaincy support:yes  Prognosis:  Prognosis concerning with multiorgan failure.   Discharge Planning: To Be Determined      Primary Diagnoses: Present on Admission:  AKI (acute kidney injury) ([place])  Hypertension  Nicotine dependence   I have reviewed the medical record, interviewed the patient and family, and examined the patient. The following aspects are pertinent.  Past Medical History:  Diagnosis Date   Cannabinoid hyperemesis syndrome    Diabetes 1.5, managed as type 1 ([place])    Gastroparesis    Perirectal abscess 06/16/2017   Social History   Socioeconomic History  Marital status: Single    Spouse name: Not on file   Number of children: Not on file   Years of education: Not on file   Highest education level: Not on file  Occupational History   Occupation: unemployed  Tobacco Use   Smoking status: Every Day    Packs/day: 0.50    Types: Cigarettes   Smokeless tobacco: Never  Vaping Use   Vaping Use: Never used  Substance and Sexual Activity   Alcohol use: No   Drug use: Yes    Types: Marijuana   Sexual activity: Not on file  Other Topics Concern   Not on file  Social History Narrative   Not on file    Social Determinants of Health   Financial Resource Strain: Not on file  Food Insecurity: Not on file  Transportation Needs: Not on file  Physical Activity: Not on file  Stress: Not on file  Social Connections: Not on file   Family History  Problem Relation Age of Onset   Diabetes Mother    Scheduled Meds:  sodium chloride   Intravenous Once   albuterol  2.5 mg Nebulization Q6H   chlorhexidine gluconate (MEDLINE KIT)  15 mL Mouth Rinse BID   Chlorhexidine Gluconate Cloth  6 each Topical Daily   docusate  100 mg Per Tube BID   feeding supplement (PROSource TF)  45 mL Per Tube TID   free water  30 mL Per Tube Q4H   hydrocortisone sod succinate (SOLU-CORTEF) inj  50 mg Intravenous Q6H   insulin aspart  3-9 Units Subcutaneous Q4H   lidocaine  1 patch Transdermal Q24H   mouth rinse  15 mL Mouth Rinse 10 times per day   metoCLOPramide (REGLAN) injection  5 mg Intravenous Q8H   midodrine  10 mg Per Tube TID WC   multivitamin  1 tablet Per Tube QHS   nutrition supplement (JUVEN)  1 packet Per Tube BID BM   pantoprazole sodium  40 mg Per Tube Daily   polyethylene glycol  17 g Per Tube Daily   Continuous Infusions:   prismasol BGK 4/2.5 300 mL/hr at 06/07/21 1133    prismasol BGK 4/2.5 300 mL/hr at 06/07/21 1133   amiodarone 30 mg/hr (06/07/21 1600)   ceFEPime (MAXIPIME) IV Stopped (06/07/21 1300)   dexmedetomidine (PRECEDEX) IV infusion 0.6 mcg/kg/hr (06/07/21 1600)   feeding supplement (VITAL 1.5 CAL) 50 mL/hr at 06/07/21 1255   fentaNYL infusion INTRAVENOUS 200 mcg/hr (06/07/21 1600)   norepinephrine (LEVOPHED) Adult infusion 4 mcg/min (06/07/21 1600)   phenylephrine (NEO-SYNEPHRINE) Adult infusion 250 mcg/min (06/07/21 1600)   prismasol BGK 2/2.5 dialysis solution Stopped (06/03/21 1756)   prismasol BGK 2/2.5 replacement solution Stopped (06/03/21 1756)   prismasol BGK 2/2.5 replacement solution Stopped (06/03/21 1756)   prismasol BGK 4/2.5 1,200 mL/hr at 06/07/21 1457    propofol (DIPRIVAN) infusion Stopped (06/07/21 1038)   vasopressin 0.03 Units/min (06/07/21 1600)   PRN Meds:.fentaNYL, guaiFENesin-dextromethorphan, heparin, ipratropium-albuterol, lip balm, ondansetron **OR** ondansetron (ZOFRAN) IV, oxyCODONE, polyvinyl alcohol, sodium chloride No Known Allergies Review of Systems  Unable to perform ROS: Acuity of condition   Physical Exam Vitals and nursing note reviewed.  Constitutional:      Appearance: He is ill-appearing.     Interventions: He is intubated.  Cardiovascular:     Rate and Rhythm: Normal rate.  Pulmonary:     Effort: No tachypnea, accessory muscle usage or respiratory distress. He is intubated.  Abdominal:     Palpations: Abdomen is  soft.  Neurological:     Comments: Sedated on vent    Vital Signs: BP 96/65    Pulse 79    Temp 99.1 F (37.3 C)    Resp 18    Ht 5' 7.99" (1.727 m)    Wt 67.9 kg    SpO2 100%    BMI 22.77 kg/m  Pain Scale: CPOT POSS *See Group Information*: 1-Acceptable,Awake and alert Pain Score: 0-No pain   SpO2: SpO2: 100 % O2 Device:SpO2: 100 % O2 Flow Rate: .O2 Flow Rate (L/min): 2 L/min  IO: Intake/output summary:  Intake/Output Summary (Last 24 hours) at 06/07/2021 1637 Last data filed at 06/07/2021 1600 Gross per 24 hour  Intake 4589.98 ml  Output 5085 ml  Net -495.02 ml    LBM: Last BM Date: 06/07/21 Baseline Weight: Weight: 65.8 kg Most recent weight: Weight: 67.9 kg     Palliative Assessment/Data:     Time Total: 45 min  Greater than 50%  of this time was spent counseling and coordinating care related to the above assessment and plan.  Signed by: Vinie Sill, NP Palliative Medicine Team Pager # [phone] (M-F 8a-5p) Team Phone # [phone] (Nights/Weekends)

## 2021-06-07 NOTE — Progress Notes (Signed)
Pts skin sloughing off & peeling when you remove anything with adhesive. Areas cleaned and vaseline gauze applied under foam dressing. NP aware.

## 2021-06-07 NOTE — Progress Notes (Signed)
[facility]  ROUNDING NOTE   Subjective:   Patient was extubated on 1/21 but had to be re-intubated for respiratory distress; suspected to have aspirated Neuro- sedated with propofol, fentanyl Pulm - vent assisted; fio2 30% Cvs: pressors - nor epi, vasopressin; Neosy was also added A-line Renal- low UOP; crrt continued   Objective:  Vital signs in last 24 hours:  Temp:  [95.4 F (35.2 C)-98.8 F (37.1 C)] 97.5 F (36.4 C) (01/25 0900) Pulse Rate:  [79] 79 (01/25 0817) Resp:  [20] 20 (01/25 0900) BP: (70-142)/(48-81) 103/70 (01/25 0900) SpO2:  [100 %] 100 % (01/25 0900) Arterial Line BP: (92-193)/(39-77) 121/64 (01/25 0900) FiO2 (%):  [30 %] 30 % (01/25 0900) Weight:  [67.9 kg] 67.9 kg (01/25 0500)  Weight change: -0.1 kg Filed Weights   06/05/21 0423 06/06/21 0417 06/07/21 0500  Weight: 69 kg 68 kg 67.9 kg    Intake/Output: I/O last 3 completed shifts: In: 4447.4 [I.V.:2957.7; NG/GT:989.7; IV Piggyback:500] Out: 2482 [Urine:5; Emesis/NG output:200; NOIBB:0488; Stool:500]   Intake/Output this shift:  Total I/O In: 341.1 [I.V.:231.1; NG/GT:110] Out: 390 [Other:390]  Physical Exam: General: Critically ill  Eyes: Anicteric  Lungs:  Vent assisted  Heart:  Tachycardic, irregular  Abdomen:  Soft,    Extremities: + peripheral edema.  Neurologic: Intubated.    Skin: No acute lesions  Access:  Right femoral temp HD catheter 06/01/21  Rectal tube, Foley in place  Basic Metabolic Panel: Recent Labs  Lab 06/03/21 0307 06/03/21 0500 06/04/21 0234 06/04/21 0508 06/05/21 0410 06/05/21 1610 06/06/21 0420 06/06/21 1530 06/07/21 0435  NA  --    < >  --    < > 133* 133* 137 137 136   135  K  --    < >  --    < > 3.5 3.6 3.7 3.7 3.7   3.7  CL  --    < >  --    < > 104 101 104 104 105   104  CO2  --    < >  --    < > 22 21* 25 25 24   23   GLUCOSE  --    < >  --    < > 182* 203* 147* 138* 196*   194*  BUN  --    < >  --    < > 17 19 19 18 20   20    CREATININE  --    < >  --    < > 1.42* 1.41* 1.24 1.08 1.05   0.99  CALCIUM  --    < >  --    < > 7.2* 7.1* 7.4* 7.3* 7.2*   7.2*  MG 1.8  --  1.9  --  2.2  --  2.4  --  2.5*  PHOS  --    < >  --    < > 1.7* 3.1 1.6* 1.9* 1.9*   1.9*   < > = values in this interval not displayed.     Liver Function Tests: Recent Labs  Lab 06/01/21 1325 06/01/21 1937 06/03/21 1416 06/04/21 0016 06/04/21 0508 06/05/21 0410 06/05/21 1610 06/06/21 0420 06/06/21 1530 06/07/21 0435  AST 101*  --  21 18  --   --   --   --   --   --   ALT 78*  --  42 39  --   --   --   --   --   --  ALKPHOS 68  --  91 89  --   --   --   --   --   --   BILITOT 1.1  --  2.2* 2.3*  --   --   --   --   --   --   PROT 4.7*  --  4.4* 4.6*  --   --   --   --   --   --   ALBUMIN 2.0*   < > 1.7*   1.7* 1.8*   < > 1.7* 1.7* 1.7* 1.6* 1.7*   < > = values in this interval not displayed.    No results for input(s): LIPASE, AMYLASE in the last 168 hours.  Recent Labs  Lab 06/04/21 0016  AMMONIA 23     CBC: Recent Labs  Lab 06/04/21 0936 06/04/21 1126 06/04/21 2308 06/05/21 0417 06/06/21 0423 06/07/21 0435  WBC 24.1* 25.2* 29.3* 30.3* 32.5* 32.6*  NEUTROABS 19.3*  --  21.9* 26.2*  --  27.6*  HGB 8.9* 8.8* 8.7* 8.3* 7.9* 7.8*  HCT 25.4* 24.6* 24.8* 23.8* 22.6* 22.7*  MCV 84.9 84.8 86.1 86.9 86.3 87.6  PLT 10* 10* 29* 15* 11* 9*     Cardiac Enzymes: Recent Labs  Lab 05/31/21 1114  CKTOTAL 411*     BNP: Invalid input(s): POCBNP  CBG: Recent Labs  Lab 06/06/21 1526 06/06/21 1946 06/07/21 0028 06/07/21 0353 06/07/21 0731  GLUCAP 121* 86 201* 170* 180*     Microbiology: Results for orders placed or performed during the hospital encounter of 05/31/21  Resp Panel by RT-PCR (Flu A&B, Covid) Nasopharyngeal Swab     Status: None   Collection Time: 05/31/21  6:58 AM   Specimen: Nasopharyngeal Swab; Nasopharyngeal(NP) swabs in vial transport medium  Result Value Ref Range Status   SARS Coronavirus  2 by RT PCR NEGATIVE NEGATIVE Final    Comment: (NOTE) SARS-CoV-2 target nucleic acids are NOT DETECTED.  The SARS-CoV-2 RNA is generally detectable in upper respiratory specimens during the acute phase of infection. The lowest concentration of SARS-CoV-2 viral copies this assay can detect is 138 copies/mL. A negative result does not preclude SARS-Cov-2 infection and should not be used as the sole basis for treatment or other patient management decisions. A negative result may occur with  improper specimen collection/handling, submission of specimen other than nasopharyngeal swab, presence of viral mutation(s) within the areas targeted by this assay, and inadequate number of viral copies(<138 copies/mL). A negative result must be combined with clinical observations, patient history, and epidemiological information. The expected result is Negative.  Fact Sheet for Patients:  EntrepreneurPulse.com.au  Fact Sheet for Healthcare Providers:  IncredibleEmployment.be  This test is no t yet approved or cleared by the [place] FDA and  has been authorized for detection and/or diagnosis of SARS-CoV-2 by FDA under an Emergency Use Authorization (EUA). This EUA will remain  in effect (meaning this test can be used) for the duration of the COVID-19 declaration under Section 564(b)(1) of the Act, 21 U.S.C.section 360bbb-3(b)(1), unless the authorization is terminated  or revoked sooner.       Influenza A by PCR NEGATIVE NEGATIVE Final   Influenza B by PCR NEGATIVE NEGATIVE Final    Comment: (NOTE) The Xpert Xpress SARS-CoV-2/FLU/RSV plus assay is intended as an aid in the diagnosis of influenza from Nasopharyngeal swab specimens and should not be used as a sole basis for treatment. Nasal washings and aspirates are unacceptable for Xpert Xpress SARS-CoV-2/FLU/RSV testing.  Fact Sheet for Patients: EntrepreneurPulse.com.au  Fact  Sheet for Healthcare Providers: IncredibleEmployment.be  This test is not yet approved or cleared by the [place] FDA and has been authorized for detection and/or diagnosis of SARS-CoV-2 by FDA under an Emergency Use Authorization (EUA). This EUA will remain in effect (meaning this test can be used) for the duration of the COVID-19 declaration under Section 564(b)(1) of the Act, 21 U.S.C. section 360bbb-3(b)(1), unless the authorization is terminated or revoked.  Performed at [facility], [place], [place] [address]   Respiratory (~20 pathogens) panel by PCR     Status: None   Collection Time: 05/31/21  6:58 AM   Specimen: Nasopharyngeal Swab; Respiratory  Result Value Ref Range Status   Adenovirus NOT DETECTED NOT DETECTED Final   Coronavirus 229E NOT DETECTED NOT DETECTED Final    Comment: (NOTE) The Coronavirus on the Respiratory Panel, DOES NOT test for the novel  Coronavirus (2019 nCoV)    Coronavirus HKU1 NOT DETECTED NOT DETECTED Final   Coronavirus NL63 NOT DETECTED NOT DETECTED Final   Coronavirus OC43 NOT DETECTED NOT DETECTED Final   Metapneumovirus NOT DETECTED NOT DETECTED Final   Rhinovirus / Enterovirus NOT DETECTED NOT DETECTED Final   Influenza A NOT DETECTED NOT DETECTED Final   Influenza B NOT DETECTED NOT DETECTED Final   Parainfluenza Virus 1 NOT DETECTED NOT DETECTED Final   Parainfluenza Virus 2 NOT DETECTED NOT DETECTED Final   Parainfluenza Virus 3 NOT DETECTED NOT DETECTED Final   Parainfluenza Virus 4 NOT DETECTED NOT DETECTED Final   Respiratory Syncytial Virus NOT DETECTED NOT DETECTED Final   Bordetella pertussis NOT DETECTED NOT DETECTED Final   Bordetella Parapertussis NOT DETECTED NOT DETECTED Final   Chlamydophila pneumoniae NOT DETECTED NOT DETECTED Final   Mycoplasma pneumoniae NOT DETECTED NOT DETECTED Final    Comment: Performed at [facility], [place]. [address], [place], [place]  [address]  Group A Strep by PCR [facility] Only)     Status: None   Collection Time: 05/31/21  8:35 AM   Specimen: Throat; Sterile Swab  Result Value Ref Range Status   Group A Strep by PCR NOT DETECTED NOT DETECTED Final    Comment: Performed at [facility], [address] [place], [place] [address]  Blood culture (routine x 2)     Status: None   Collection Time: 05/31/21  8:39 AM   Specimen: BLOOD  Result Value Ref Range Status   Specimen Description BLOOD BLOOD RIGHT FOREARM  Final   Special Requests   Final    BOTTLES DRAWN AEROBIC AND ANAEROBIC Blood Culture results may not be optimal due to an excessive volume of blood received in culture bottles   Culture   Final    NO GROWTH 5 DAYS Performed at [facility], [address] [place], [place] [address]    Report Status 06/05/2021 FINAL  Final  Blood culture (routine x 2)     Status: None   Collection Time: 05/31/21  8:39 AM   Specimen: BLOOD  Result Value Ref Range Status   Specimen Description BLOOD RIGHT ANTECUBITAL  Final   Special Requests   Final    BOTTLES DRAWN AEROBIC AND ANAEROBIC Blood Culture results may not be optimal due to an excessive volume of blood received in culture bottles   Culture   Final    NO GROWTH 5 DAYS Performed at [facility], [address] [place], [place] [address]  Report Status 06/05/2021 FINAL  Final  C Difficile Quick Screen w PCR reflex     Status: None   Collection Time: 05/31/21  1:20 PM   Specimen: Stool  Result Value Ref Range Status   C Diff antigen NEGATIVE NEGATIVE Final   C Diff toxin NEGATIVE NEGATIVE Final   C Diff interpretation No C. difficile detected.  Final    Comment: Performed at [facility], [address] [place], [place] [address]  Gastrointestinal Panel by PCR , Stool     Status: None   Collection Time: 05/31/21  1:30 PM   Specimen: Stool  Result Value Ref Range Status   Campylobacter species NOT DETECTED NOT DETECTED  Final   Plesimonas shigelloides NOT DETECTED NOT DETECTED Final   Salmonella species NOT DETECTED NOT DETECTED Final   Yersinia enterocolitica NOT DETECTED NOT DETECTED Final   Vibrio species NOT DETECTED NOT DETECTED Final   Vibrio cholerae NOT DETECTED NOT DETECTED Final   Enteroaggregative E coli (EAEC) NOT DETECTED NOT DETECTED Final   Enteropathogenic E coli (EPEC) NOT DETECTED NOT DETECTED Final   Enterotoxigenic E coli (ETEC) NOT DETECTED NOT DETECTED Final   Shiga like toxin producing E coli (STEC) NOT DETECTED NOT DETECTED Final   Shigella/Enteroinvasive E coli (EIEC) NOT DETECTED NOT DETECTED Final   Cryptosporidium NOT DETECTED NOT DETECTED Final   Cyclospora cayetanensis NOT DETECTED NOT DETECTED Final   Entamoeba histolytica NOT DETECTED NOT DETECTED Final   Giardia lamblia NOT DETECTED NOT DETECTED Final   Adenovirus F40/41 NOT DETECTED NOT DETECTED Final   Astrovirus NOT DETECTED NOT DETECTED Final   Norovirus GI/GII NOT DETECTED NOT DETECTED Final   Rotavirus A NOT DETECTED NOT DETECTED Final   Sapovirus (I, II, IV, and V) NOT DETECTED NOT DETECTED Final    Comment: Performed at [facility], [address], [place], [place] [address]  Urine Culture     Status: None   Collection Time: 06/01/21 10:23 AM   Specimen: In/Out Cath Urine  Result Value Ref Range Status   Specimen Description   Final    IN/OUT CATH URINE Performed at [facility], [address], [place], [place] [address]    Special Requests   Final    NONE Performed at [facility], [address] [place], [place] [address]    Culture   Final    NO GROWTH Performed at [facility], [address] [place], [place] [address]    Report Status 06/03/2021 FINAL  Final  Culture, Respiratory w Gram Stain     Status: None   Collection Time: 06/01/21  3:59 PM   Specimen: Tracheal Aspirate; Respiratory  Result Value Ref Range Status   Specimen Description   Final     TRACHEAL ASPIRATE Performed at [facility], [address], [place], [place] [address]    Special Requests   Final    NONE Performed at [facility], [place], [place] [address]    Gram Stain   Final    RARE SQUAMOUS EPITHELIAL CELLS PRESENT MODERATE WBC PRESENT,BOTH PMN AND MONONUCLEAR MODERATE GRAM POSITIVE COCCI FEW GRAM NEGATIVE RODS FEW GRAM POSITIVE RODS RARE YEAST    Culture   Final    FEW Consistent with normal respiratory flora. No Pseudomonas species isolated Performed at [facility] [address], [place], [place] [address]    Report Status 06/03/2021 FINAL  Final  CULTURE, BLOOD (ROUTINE X 2)  w Reflex to ID Panel     Status: None   Collection Time: 06/01/21  7:06 PM   Specimen: BLOOD  Result Value Ref Range Status   Specimen Description BLOOD RIGHT ANTECUBITAL  Final   Special Requests   Final    BOTTLES DRAWN AEROBIC ONLY Blood Culture results may not be optimal due to an inadequate volume of blood received in culture bottles   Culture   Final    NO GROWTH 5 DAYS Performed at [facility], [address] [place], [place] [address]    Report Status 06/06/2021 FINAL  Final  CULTURE, BLOOD (ROUTINE X 2) w Reflex to ID Panel     Status: None   Collection Time: 06/01/21  7:37 PM   Specimen: BLOOD  Result Value Ref Range Status   Specimen Description BLOOD [facility]  Final   Special Requests BOTTLES DRAWN AEROBIC AND ANAEROBIC BCLV  Final   Culture   Final    NO GROWTH 5 DAYS Performed at [facility], [address] [place], [place] [address]    Report Status 06/06/2021 FINAL  Final  MRSA Next Gen by PCR, Nasal     Status: None   Collection Time: 06/02/21 10:46 AM   Specimen: Nasal Mucosa; Nasal Swab  Result Value Ref Range Status   MRSA by PCR Next Gen NOT DETECTED NOT DETECTED Final    Comment: (NOTE) The GeneXpert MRSA Assay (FDA approved for NASAL specimens only), is one component of a  comprehensive MRSA colonization surveillance program. It is not intended to diagnose MRSA infection nor to guide or monitor treatment for MRSA infections. Test performance is not FDA approved in patients less than 62 years old. Performed at [facility], [address] [place], [place] [address]   Culture, Respiratory w Gram Stain     Status: None (Preliminary result)   Collection Time: 06/04/21  7:38 AM   Specimen: Tracheal Aspirate; Respiratory  Result Value Ref Range Status   Specimen Description   Final    TRACHEAL ASPIRATE Performed at [facility], [address], [place], [place] [address]    Special Requests   Final    NONE Performed at [facility], [address] [place], [place] [address]    Gram Stain   Final    MODERATE WBC PRESENT,BOTH PMN AND MONONUCLEAR NO ORGANISMS SEEN    Culture   Final    FEW CANDIDA ALBICANS RARE GRAM NEGATIVE RODS IDENTIFICATION TO FOLLOW Performed at [facility], [place] [address], [place], [place] [address]    Report Status PENDING  Incomplete  CULTURE, BLOOD (ROUTINE X 2) w Reflex to ID Panel     Status: None (Preliminary result)   Collection Time: 06/04/21  6:35 PM   Specimen: BLOOD  Result Value Ref Range Status   Specimen Description BLOOD BLOOD LEFT HAND  Final   Special Requests   Final    BOTTLES DRAWN AEROBIC AND ANAEROBIC Blood Culture adequate volume   Culture   Final    NO GROWTH 2 DAYS Performed at [facility], [address] [place], [place] [address]    Report Status PENDING  Incomplete    Coagulation Studies: No results for input(s): LABPROT, INR in the last 72 hours.   Urinalysis: No results for input(s): COLORURINE, LABSPEC, PHURINE, GLUCOSEU, HGBUR, BILIRUBINUR, KETONESUR, PROTEINUR, UROBILINOGEN, NITRITE, LEUKOCYTESUR in the last 72 hours.  Invalid input(s): APPERANCEUR     Imaging: ECHOCARDIOGRAM LIMITED  Result Date: 06/06/2021    ECHOCARDIOGRAM  LIMITED REPORT   Patient Name:   Jonathan Mcmillan Date of Exam: 06/05/2021 Medical Rec #:  [id number]       Height:       68.0 in Accession #:    [id number]      Weight:       152.1 lb Date of Birth:  July 28, 1995       BSA:          1.819 m Patient Age:    25 years        BP:           102/80 mmHg Patient Gender: M               HR:           56 bpm. Exam Location:  [facility] Procedure: 2D Echo, Color Doppler and Cardiac Doppler Indications:     CHF I50.9  History:         Patient has prior history of Echocardiogram examinations, most                  recent 06/01/2021. Risk Factors:Diabetes.  Sonographer:     Sherrie Sport Referring Phys:  [id number] ADAM ROSS SCHERTZ Diagnosing Phys: Ida Rogue MD  Sonographer Comments: Echo performed with patient supine and on artificial respirator. IMPRESSIONS  1. Left ventricular ejection fraction, by estimation, is 35 to 40%. The left ventricle has moderately decreased function. The left ventricle demonstrates global hypokinesis. There is mild left ventricular hypertrophy. Left ventricular diastolic parameters are indeterminate.  2. Right ventricular systolic function is mildly reduced. The right ventricular size is normal. There is normal pulmonary artery systolic pressure. The estimated right ventricular systolic pressure is 22.0 mmHg.  3. The mitral valve is normal in structure. Mild mitral valve regurgitation. No evidence of mitral stenosis.  4. Tricuspid valve regurgitation is mild to moderate.  5. The aortic valve has an indeterminant number of cusps. Aortic valve regurgitation is mild. No aortic stenosis is present.  6. The inferior vena cava is normal in size with greater than 50% respiratory variability, suggesting right atrial pressure of 3 mmHg. FINDINGS  Left Ventricle: Left ventricular ejection fraction, by estimation, is 35 to 40%. The left ventricle has moderately decreased function. The left ventricle demonstrates global hypokinesis. The left ventricular internal cavity  size was normal in size. There is mild left ventricular hypertrophy. Left ventricular diastolic parameters are indeterminate. Right Ventricle: The right ventricular size is normal. No increase in right ventricular wall thickness. Right ventricular systolic function is mildly reduced. There is normal pulmonary artery systolic pressure. The tricuspid regurgitant velocity is 2.58 m/s, and with an assumed right atrial pressure of 5 mmHg, the estimated right ventricular systolic pressure is 25.4 mmHg. Left Atrium: Left atrial size was normal in size. Right Atrium: Right atrial size was normal in size. Pericardium: There is no evidence of pericardial effusion. Mitral Valve: The mitral valve is normal in structure. Mild mitral valve regurgitation. No evidence of mitral valve stenosis. Tricuspid Valve: The tricuspid valve is normal in structure. Tricuspid valve regurgitation is mild to moderate. No evidence of tricuspid stenosis. Aortic Valve: The aortic valve has an indeterminant number of cusps. Aortic valve regurgitation is mild. No aortic stenosis is present. Aortic valve mean gradient measures 3.5 mmHg. Aortic valve peak gradient measures 7.1 mmHg. Aortic valve area, by VTI measures 2.90 cm. Pulmonic Valve: The pulmonic valve was normal in structure. Pulmonic valve regurgitation is  not visualized. No evidence of pulmonic stenosis. Aorta: The aortic root is normal in size and structure. Venous: The inferior vena cava is normal in size with greater than 50% respiratory variability, suggesting right atrial pressure of 3 mmHg. IAS/Shunts: No atrial level shunt detected by color flow Doppler. LEFT VENTRICLE PLAX 2D LVIDd:         4.60 cm LVIDs:         4.00 cm LV PW:         1.60 cm LV IVS:        0.90 cm LVOT diam:     2.20 cm LV SV:         39 LV SV Index:   21 LVOT Area:     3.80 cm  RIGHT VENTRICLE RV Basal diam:  3.90 cm RV S prime:     11.00 cm/s TAPSE (M-mode): 1.8 cm LEFT ATRIUM             Index        RIGHT  ATRIUM           Index LA diam:        3.40 cm 1.87 cm/m   RA Area:     13.20 cm LA Vol (A2C):   49.2 ml 27.04 ml/m  RA Volume:   29.90 ml  16.43 ml/m LA Vol (A4C):   31.9 ml 17.53 ml/m LA Biplane Vol: 41.9 ml 23.03 ml/m  AORTIC VALVE                    PULMONIC VALVE AV Area (Vmax):    2.38 cm     PV Vmax:        0.57 m/s AV Area (Vmean):   2.50 cm     PV Vmean:       35.800 cm/s AV Area (VTI):     2.90 cm     PV VTI:         0.060 m AV Vmax:           133.50 cm/s  PV Peak grad:   1.3 mmHg AV Vmean:          83.900 cm/s  PV Mean grad:   1.0 mmHg AV VTI:            0.134 m      RVOT Peak grad: 4 mmHg AV Peak Grad:      7.1 mmHg AV Mean Grad:      3.5 mmHg LVOT Vmax:         83.60 cm/s LVOT Vmean:        55.100 cm/s LVOT VTI:          0.102 m LVOT/AV VTI ratio: 0.76  AORTA Ao Root diam: 2.93 cm MITRAL VALVE                TRICUSPID VALVE MV Area (PHT): 3.65 cm     TR Peak grad:   26.6 mmHg MV Decel Time: 208 msec     TR Vmax:        258.00 cm/s MV E velocity: 101.00 cm/s                             SHUNTS                             Systemic VTI:  0.10 m  Systemic Diam: 2.20 cm                             Pulmonic VTI:  0.115 m Ida Rogue MD Electronically signed by Ida Rogue MD Signature Date/Time: 06/06/2021/1:23:45 PM    Final      Medications:     prismasol BGK 4/2.5 300 mL/hr at 06/06/21 1855    prismasol BGK 4/2.5 300 mL/hr at 06/06/21 1854   amiodarone 30 mg/hr (06/07/21 0941)   ceFEPime (MAXIPIME) IV     feeding supplement (VITAL 1.5 CAL) 40 mL/hr at 06/07/21 0700   fentaNYL infusion INTRAVENOUS 150 mcg/hr (06/07/21 0900)   norepinephrine (LEVOPHED) Adult infusion 2 mcg/min (06/07/21 0900)   phenylephrine (NEO-SYNEPHRINE) Adult infusion 400 mcg/min (06/07/21 0900)   prismasol BGK 2/2.5 dialysis solution Stopped (06/03/21 1756)   prismasol BGK 2/2.5 replacement solution Stopped (06/03/21 1756)   prismasol BGK 2/2.5 replacement solution Stopped  (06/03/21 1756)   prismasol BGK 4/2.5 1,500 mL/hr at 06/07/21 0724   propofol (DIPRIVAN) infusion 25 mcg/kg/min (06/07/21 0900)   sodium phosphate  Dextrose 5% IVPB     vasopressin 0.03 Units/min (06/07/21 0900)    sodium chloride   Intravenous Once   albuterol  2.5 mg Nebulization Q6H   chlorhexidine gluconate (MEDLINE KIT)  15 mL Mouth Rinse BID   Chlorhexidine Gluconate Cloth  6 each Topical Daily   docusate  100 mg Per Tube BID   feeding supplement (PROSource TF)  45 mL Per Tube TID   free water  30 mL Per Tube Q4H   hydrocortisone sod succinate (SOLU-CORTEF) inj  50 mg Intravenous Q6H   insulin aspart  3-9 Units Subcutaneous Q4H   lidocaine  1 patch Transdermal Q24H   mouth rinse  15 mL Mouth Rinse 10 times per day   metoCLOPramide (REGLAN) injection  5 mg Intravenous Q8H   midodrine  10 mg Per Tube TID WC   multivitamin  1 tablet Per Tube QHS   nutrition supplement (JUVEN)  1 packet Per Tube BID BM   pantoprazole sodium  40 mg Per Tube Daily   polyethylene glycol  17 g Per Tube Daily   fentaNYL, guaiFENesin-dextromethorphan, heparin, ipratropium-albuterol, lip balm, ondansetron **OR** ondansetron (ZOFRAN) IV, oxyCODONE, polyvinyl alcohol, sodium chloride  Assessment/ Plan:  [name] is a 26 y.o. black male with hypertension, insulin dependent diabetes mellitus type I, diabetic gastroparesis, diabetic neuropathy, tobacco use, THC use, right toe amputation, perirectal abscess, who is admitted to [facility] on 05/31/2021 for SIRS (systemic inflammatory response syndrome) ([place]) [R65.10] AKI (acute kidney injury) ([place]) [N17.9] Symptomatic anemia [D64.9] Acute renal failure superimposed on stage 5 chronic kidney disease, not on chronic dialysis, unspecified acute renal failure type ([place]) [N17.9, N18.5] Hypertension, unspecified type [I10] Acute cough [R05.1]  Cardiac arrest with code blue 06/01/2021 at 10 am. Transferred to ICU. Intubated  Acute kidney injury on chronic kidney  disease stage IV versus progression of chronic kidney disease to end stage renal disease. Baseline creatinine of 4.13, GFR of 19 on 08/26/20. History of nephrotic range proteinuria and hematuria. No history of renal biopsy. Strong family history of dialysis with mother with ESRD before passing.  - remains hemodynamically unstable.  - Continue CVVHDF Pre filter and post filter replacement 300 ml/hr Dialysate 1500 ml/hr-->1200 cc/min Net Fluid removal rate 50 ml/hr-->0 cc/hr    Acute respiratory failure Vent assisted, suspected to have aspirated Extubated then re-intubated 06/03/2021  Hypotension with cardiogenic  shock - requiring vasopressors.   4. Insulin dependent Diabetes type 1 with CKD and proteinuria   Lab Results  Component Value Date   HGBA1C 5.7 (H) 05/31/2021    5. Thrombocytopenia Hematology evaluation on 1/22 Suspected peripheral destruction Work up in progress Getting platelet infusion today      LOS: 7 Samani Deal 1/25/202310:17 AM

## 2021-06-08 ENCOUNTER — Inpatient Hospital Stay: Payer: Medicaid Other

## 2021-06-08 LAB — CBC
HCT: 18.5 % — ABNORMAL LOW (ref 39.0–52.0)
Hemoglobin: 6.7 g/dL — ABNORMAL LOW (ref 13.0–17.0)
MCH: 29.9 pg (ref 26.0–34.0)
MCHC: 36.2 g/dL — ABNORMAL HIGH (ref 30.0–36.0)
MCV: 82.6 fL (ref 80.0–100.0)
Platelets: 17 10*3/uL — CL (ref 150–400)
RBC: 2.24 MIL/uL — ABNORMAL LOW (ref 4.22–5.81)
RDW: 15.1 % (ref 11.5–15.5)
WBC: 31.7 10*3/uL — ABNORMAL HIGH (ref 4.0–10.5)
nRBC: 0.6 % — ABNORMAL HIGH (ref 0.0–0.2)

## 2021-06-08 LAB — BASIC METABOLIC PANEL
Anion gap: 7 (ref 5–15)
BUN: 27 mg/dL — ABNORMAL HIGH (ref 6–20)
CO2: 22 mmol/L (ref 22–32)
Calcium: 6.6 mg/dL — ABNORMAL LOW (ref 8.9–10.3)
Chloride: 107 mmol/L (ref 98–111)
Creatinine, Ser: 1.21 mg/dL (ref 0.61–1.24)
GFR, Estimated: >60 mL/min (ref >60)
Glucose, Bld: 157 mg/dL — ABNORMAL HIGH (ref 70–99)
Potassium: 3.5 mmol/L (ref 3.5–5.1)
Sodium: 136 mmol/L (ref 135–145)

## 2021-06-08 LAB — RENAL FUNCTION PANEL
Albumin: 1.7 g/dL — ABNORMAL LOW (ref 3.5–5.0)
Albumin: 1.6 g/dL — ABNORMAL LOW (ref 3.5–5.0)
Anion gap: 9 (ref 5–15)
Anion gap: 9 (ref 5–15)
BUN: 28 mg/dL — ABNORMAL HIGH (ref 6–20)
BUN: 33 mg/dL — ABNORMAL HIGH (ref 6–20)
CO2: 22 mmol/L (ref 22–32)
CO2: 23 mmol/L (ref 22–32)
Calcium: 6.7 mg/dL — ABNORMAL LOW (ref 8.9–10.3)
Calcium: 6.8 mg/dL — ABNORMAL LOW (ref 8.9–10.3)
Chloride: 106 mmol/L (ref 98–111)
Chloride: 105 mmol/L (ref 98–111)
Creatinine, Ser: 1.19 mg/dL (ref 0.61–1.24)
Creatinine, Ser: 1.16 mg/dL (ref 0.61–1.24)
GFR, Estimated: >60 mL/min (ref >60)
GFR, Estimated: >60 mL/min (ref >60)
Glucose, Bld: 158 mg/dL — ABNORMAL HIGH (ref 70–99)
Glucose, Bld: 122 mg/dL — ABNORMAL HIGH (ref 70–99)
Phosphorus: 2.6 mg/dL (ref 2.5–4.6)
Phosphorus: 2.7 mg/dL (ref 2.5–4.6)
Potassium: 3.6 mmol/L (ref 3.5–5.1)
Potassium: 3.9 mmol/L (ref 3.5–5.1)
Sodium: 137 mmol/L (ref 135–145)
Sodium: 137 mmol/L (ref 135–145)

## 2021-06-08 LAB — ADAMTS13 ACTIVITY: Adamts 13 Activity: 49.2 % — ABNORMAL LOW (ref >66.8)

## 2021-06-08 LAB — BPAM PLATELET PHERESIS
Blood Product Expiration Date: 202301252359
ISSUE DATE / TIME: 202301250754
Unit Type and Rh: 600

## 2021-06-08 LAB — GLUCOSE, CAPILLARY
Glucose-Capillary: 143 mg/dL — ABNORMAL HIGH (ref 70–99)
Glucose-Capillary: 142 mg/dL — ABNORMAL HIGH (ref 70–99)
Glucose-Capillary: 117 mg/dL — ABNORMAL HIGH (ref 70–99)
Glucose-Capillary: 115 mg/dL — ABNORMAL HIGH (ref 70–99)
Glucose-Capillary: 129 mg/dL — ABNORMAL HIGH (ref 70–99)

## 2021-06-08 LAB — PREPARE PLATELET PHERESIS: Unit division: 0

## 2021-06-08 LAB — PHOSPHORUS: Phosphorus: 2.6 mg/dL (ref 2.5–4.6)

## 2021-06-08 LAB — MAGNESIUM: Magnesium: 2.4 mg/dL (ref 1.7–2.4)

## 2021-06-08 LAB — ADAMTS13 ACTIVITY REFLEX

## 2021-06-08 LAB — TRIGLYCERIDES: Triglycerides: 110 mg/dL (ref <150)

## 2021-06-08 LAB — CORTISOL-AM, BLOOD: Cortisol - AM: >100 ug/dL — ABNORMAL HIGH (ref 6.7–22.6)

## 2021-06-08 LAB — LIPASE, BLOOD: Lipase: 16 U/L (ref 11–51)

## 2021-06-08 MED ORDER — ALTEPLASE 2 MG IJ SOLR
2.0000 mg | Freq: Once | INTRAMUSCULAR | Status: AC
  Administered 2021-06-08: 2 mg
  Filled 2021-06-08: qty 2

## 2021-06-08 MED ORDER — STERILE WATER FOR INJECTION IJ SOLN
INTRAMUSCULAR | Status: AC
  Filled 2021-06-08: qty 10

## 2021-06-08 MED ORDER — MIDAZOLAM HCL 2 MG/2ML IJ SOLN
2.0000 mg | Freq: Once | INTRAMUSCULAR | Status: AC
  Administered 2021-06-08: 2 mg via INTRAVENOUS
  Filled 2021-06-08: qty 2

## 2021-06-08 MED ORDER — SODIUM CHLORIDE 0.9% IV SOLUTION: Freq: Once | INTRAVENOUS | Status: AC

## 2021-06-08 MED ORDER — SODIUM CHLORIDE 0.9% FLUSH
10.0000 mL | INTRAVENOUS | Status: DC | PRN
  Administered 2021-06-11: 09:00:00 10 mL

## 2021-06-08 MED ORDER — CEFAZOLIN SODIUM-DEXTROSE 2-4 GM/100ML-% IV SOLN
2.0000 g | Freq: Two times a day (BID) | INTRAVENOUS | Status: DC
  Administered 2021-06-08 – 2021-06-10 (×6): 2 g via INTRAVENOUS
  Filled 2021-06-08 (×7): qty 100

## 2021-06-08 MED ORDER — SODIUM CHLORIDE 0.9% FLUSH
10.0000 mL | Freq: Two times a day (BID) | INTRAVENOUS | Status: DC
  Administered 2021-06-08 – 2021-06-22 (×24): 10 mL

## 2021-06-08 MED ORDER — ORAL CARE MOUTH RINSE
15.0000 mL | Freq: Two times a day (BID) | OROMUCOSAL | Status: DC
  Administered 2021-06-08 – 2021-06-22 (×24): 15 mL via OROMUCOSAL

## 2021-06-08 NOTE — Progress Notes (Signed)
SUBJECTIVE: Jonathan Mcmillan is a 26 y.o. male with medical history including diabetes, CKD, HTN, nicotine dependence admitted on 05/31/2021 with AKI on CKD, anemia, and fevers/chills/myalgias . Patient suffered cardiac arrest on the morning of 1/19 and was ultimately intubated and transferred to the ICU.   Patient intubated.    Vitals:   06/08/21 0500 06/08/21 0600 06/08/21 0700 06/08/21 0732  BP: 114/77 116/77 97/75   Pulse:      Resp:      Temp: (!) 97.3 F (36.3 C) (!) 96.8 F (36 C) (!) 97.3 F (36.3 C)   TempSrc:      SpO2:    100%  Weight:      Height:        Intake/Output Summary (Last 24 hours) at 06/08/2021 0847 Last data filed at 06/08/2021 0800 Gross per 24 hour  Intake 4421.34 ml  Output 3896.6 ml  Net 524.74 ml    LABS: Basic Metabolic Panel: Recent Labs    06/07/21 0435 06/07/21 1530 06/08/21 0407  NA 136   135 138 137   136  K 3.7   3.7 3.6 3.6   3.5  CL 105   104 104 106   107  CO2 24   23 24 22   22   GLUCOSE 196*   194* 93 158*   157*  BUN 20   20 19  28*   27*  CREATININE 1.05   0.99 1.02 1.19   1.21  CALCIUM 7.2*   7.2* 7.0* 6.7*   6.6*  MG 2.5*  --  2.4  PHOS 1.9*   1.9* 3.0 2.6   2.6   Liver Function Tests: Recent Labs    06/07/21 1530 06/08/21 0407  ALBUMIN 1.7* 1.7*   Recent Labs    06/08/21 0407  LIPASE 16   CBC: Recent Labs    06/07/21 0435 06/07/21 2004 06/08/21 0407  WBC 32.6* 34.2* 31.7*  NEUTROABS 27.6* 26.5*  --   HGB 7.8* 7.3* 6.7*  HCT 22.7* 20.9* 18.5*  MCV 87.6 85.3 82.6  PLT 9* 21* 17*   Cardiac Enzymes: No results for input(s): CKTOTAL, CKMB, CKMBINDEX, TROPONINI in the last 72 hours. BNP: Invalid input(s): POCBNP D-Dimer: No results for input(s): DDIMER in the last 72 hours. Hemoglobin A1C: No results for input(s): HGBA1C in the last 72 hours. Fasting Lipid Panel: Recent Labs    06/08/21 0407  TRIG 110   Thyroid Function Tests: No results for input(s): TSH, T4TOTAL, T3FREE, THYROIDAB in the last 72  hours.  Invalid input(s): FREET3 Anemia Panel: Recent Labs    06/06/21 0423  RETICCTPCT 0.5     PHYSICAL EXAM General: critically ill HEENT:  Normocephalic and atramatic Neck:         No JVD.  Lungs: vent assited Heart: atrial fibrillation with RVR Abdomen:  soft  Extremities: peripheral edema   Neuro: intubated  TELEMETRY: sinus rhythm, HR 73 bpm  ASSESSMENT AND PLAN: Patient cardiac arrest likely due to asystole secondary to metabolic disorder, Mildly elevated troponin levels due to demand ischemia.   Echo performed 06/01/21 revealed EF 35-40%. Mild LV dysfunction likely non-cardiac, age of patient most likely not CAD.    06/05/21 Patient went in to atrial fibrillation with RVR. Amiodarone drip per protocol started. Patient heart rate improved today, 78 bpm and converted to sinus rhythm. Continue amiodarone for now. Patient alert this morning, asking for writing supplies. Will continue to follow.  Principal Problem:   AKI (acute kidney injury) ([place])  Active Problems:   Hypertension   Acute kidney injury superimposed on CKD (HCC)   Type 2 diabetes mellitus with hypoglycemia without coma, without long-term current use of insulin (HCC)   Nicotine dependence   Symptomatic anemia   Hypotension   Fever   Cardiopulmonary arrest (HCC)   Pressure injury of skin   Acute respiratory failure with hypoxia (HCC)   Thrombocytopenia ([place])    Chosen Geske, FNP-C 06/08/2021 8:47 AM

## 2021-06-08 NOTE — Progress Notes (Signed)
CRRT filter pressure elevated, attempted to return blood but machine would not allow.

## 2021-06-08 NOTE — Progress Notes (Signed)
[facility]  ROUNDING NOTE   Subjective:   Patient was extubated on 1/21 but had to be re-intubated for respiratory distress; suspected to have aspirated Neuro- sedated with propofol, fentanyl. Awake this morning.  Indicating that he wants to write something Pulm - vent assisted; fio2 30%.  Possible extubation later today Cvs: pressors - nor epi, vasopressin; Neo-Synephrine.  A-line in place. Renal- low UOP; crrt continued    Objective:  Vital signs in last 24 hours:  Temp:  [96.8 F (36 C)-99.9 F (37.7 C)] 97.7 F (36.5 C) (01/26 0900) Pulse Rate:  [77-80] 77 (01/25 1730) Resp:  [13-20] 18 (01/25 1900) BP: (93-161)/(57-111) 104/74 (01/26 0900) SpO2:  [100 %] 100 % (01/26 0732) Arterial Line BP: (104-197)/(48-105) 127/72 (01/26 0900) FiO2 (%):  [30 %] 30 % (01/26 0732) Weight:  [67.4 kg] 67.4 kg (01/26 0437)  Weight change: -0.5 kg Filed Weights   06/06/21 0417 06/07/21 0500 06/08/21 0437  Weight: 68 kg 67.9 kg 67.4 kg    Intake/Output: I/O last 3 completed shifts: In: 1287 [I.V.:3800.8; Blood:367; NG/GT:2065.8; IV Piggyback:441.4] Out: Nadia.Doe [Urine:5; Other:5907; Stool:850]   Intake/Output this shift:  Total I/O In: 561.4 [I.V.:241.4; NG/GT:320] Out: 470.6 [Other:220.6; Stool:250]  Physical Exam: General: Critically ill  Eyes: Anicteric  Lungs:  Vent assisted  Heart:  Tachycardic, irregular  Abdomen:  Soft,    Extremities: + peripheral edema.  Neurologic: Drowsy, looking around in the room.  Skin: No acute lesions  Access:  Right femoral temp HD catheter 06/01/21  Rectal tube, Foley in place  Basic Metabolic Panel: Recent Labs  Lab 06/04/21 0234 06/04/21 0508 06/05/21 0410 06/05/21 1610 06/06/21 0420 06/06/21 1530 06/07/21 0435 06/07/21 1530 06/08/21 0407  NA  --    < > 133*   < > 137 137 136   135 138 137   136  K  --    < > 3.5   < > 3.7 3.7 3.7   3.7 3.6 3.6   3.5  CL  --    < > 104   < > 104 104 105   104 104 106   107  CO2  --     < > 22   < > _0 GLUCOSE  --    < > 182*   < > 147* 138* 196*   194* 93 158*   157*  BUN  --    < > 17   < > _1 28*   27*  CREATININE  --    < > 1.42*   < > 1.24 1.08 1.05   0.99 1.02 1.19   1.21  CALCIUM  --    < > 7.2*   < > 7.4* 7.3* 7.2*   7.2* 7.0* 6.7*   6.6*  MG 1.9  --  2.2  --  2.4  --  2.5*  --  2.4  PHOS  --    < > 1.7*   < > 1.6* 1.9* 1.9*   1.9* 3.0 2.6   2.6   < > = values in this interval not displayed.     Liver Function Tests: Recent Labs  Lab 06/01/21 1325 06/01/21 1937 06/03/21 1416 06/04/21 0016 06/04/21 0508 06/06/21 0420 06/06/21 1530 06/07/21 0435 06/07/21 1530 06/08/21 0407  AST 101*  --  21 18  --   --   --   --   --   --  ALT 78*  --  42 39  --   --   --   --   --   --   ALKPHOS 68  --  91 89  --   --   --   --   --   --   BILITOT 1.1  --  2.2* 2.3*  --   --   --   --   --   --   PROT 4.7*  --  4.4* 4.6*  --   --   --   --   --   --   ALBUMIN 2.0*   < > 1.7*   1.7* 1.8*   < > 1.7* 1.6* 1.7* 1.7* 1.7*   < > = values in this interval not displayed.    Recent Labs  Lab 06/08/21 0407  LIPASE 16    Recent Labs  Lab 06/04/21 0016  AMMONIA 23     CBC: Recent Labs  Lab 06/04/21 0936 06/04/21 1126 06/04/21 2308 06/05/21 0417 06/06/21 0423 06/07/21 0435 06/07/21 2004 06/08/21 0407  WBC 24.1*   < > 29.3* 30.3* 32.5* 32.6* 34.2* 31.7*  NEUTROABS 19.3*  --  21.9* 26.2*  --  27.6* 26.5*  --   HGB 8.9*   < > 8.7* 8.3* 7.9* 7.8* 7.3* 6.7*  HCT 25.4*   < > 24.8* 23.8* 22.6* 22.7* 20.9* 18.5*  MCV 84.9   < > 86.1 86.9 86.3 87.6 85.3 82.6  PLT 10*   < > 29* 15* 11* 9* 21* 17*   < > = values in this interval not displayed.     Cardiac Enzymes: No results for input(s): CKTOTAL, CKMB, CKMBINDEX, TROPONINI in the last 168 hours.   BNP: Invalid input(s): POCBNP  CBG: Recent Labs  Lab 06/07/21 1521 06/07/21 2041 06/07/21 2333 06/08/21 0421 06/08/21 0832  GLUCAP 90 148* 192* 143* 142*      Microbiology: Results for orders placed or performed during the hospital encounter of 05/31/21  Resp Panel by RT-PCR (Flu A&B, Covid) Nasopharyngeal Swab     Status: None   Collection Time: 05/31/21  6:58 AM   Specimen: Nasopharyngeal Swab; Nasopharyngeal(NP) swabs in vial transport medium  Result Value Ref Range Status   SARS Coronavirus 2 by RT PCR NEGATIVE NEGATIVE Final    Comment: (NOTE) SARS-CoV-2 target nucleic acids are NOT DETECTED.  The SARS-CoV-2 RNA is generally detectable in upper respiratory specimens during the acute phase of infection. The lowest concentration of SARS-CoV-2 viral copies this assay can detect is 138 copies/mL. A negative result does not preclude SARS-Cov-2 infection and should not be used as the sole basis for treatment or other patient management decisions. A negative result may occur with  improper specimen collection/handling, submission of specimen other than nasopharyngeal swab, presence of viral mutation(s) within the areas targeted by this assay, and inadequate number of viral copies(<138 copies/mL). A negative result must be combined with clinical observations, patient history, and epidemiological information. The expected result is Negative.  Fact Sheet for Patients:  EntrepreneurPulse.com.au  Fact Sheet for Healthcare Providers:  IncredibleEmployment.be  This test is no t yet approved or cleared by the [place] FDA and  has been authorized for detection and/or diagnosis of SARS-CoV-2 by FDA under an Emergency Use Authorization (EUA). This EUA will remain  in effect (meaning this test can be used) for the duration of the COVID-19 declaration under Section 564(b)(1) of the Act, 21 U.S.C.section 360bbb-3(b)(1), unless the authorization is terminated  or  revoked sooner.       Influenza A by PCR NEGATIVE NEGATIVE Final   Influenza B by PCR NEGATIVE NEGATIVE Final    Comment: (NOTE) The Xpert  Xpress SARS-CoV-2/FLU/RSV plus assay is intended as an aid in the diagnosis of influenza from Nasopharyngeal swab specimens and should not be used as a sole basis for treatment. Nasal washings and aspirates are unacceptable for Xpert Xpress SARS-CoV-2/FLU/RSV testing.  Fact Sheet for Patients: EntrepreneurPulse.com.au  Fact Sheet for Healthcare Providers: IncredibleEmployment.be  This test is not yet approved or cleared by the [place] FDA and has been authorized for detection and/or diagnosis of SARS-CoV-2 by FDA under an Emergency Use Authorization (EUA). This EUA will remain in effect (meaning this test can be used) for the duration of the COVID-19 declaration under Section 564(b)(1) of the Act, 21 U.S.C. section 360bbb-3(b)(1), unless the authorization is terminated or revoked.  Performed at [facility], [place], [place] [address]   Respiratory (~20 pathogens) panel by PCR     Status: None   Collection Time: 05/31/21  6:58 AM   Specimen: Nasopharyngeal Swab; Respiratory  Result Value Ref Range Status   Adenovirus NOT DETECTED NOT DETECTED Final   Coronavirus 229E NOT DETECTED NOT DETECTED Final    Comment: (NOTE) The Coronavirus on the Respiratory Panel, DOES NOT test for the novel  Coronavirus (2019 nCoV)    Coronavirus HKU1 NOT DETECTED NOT DETECTED Final   Coronavirus NL63 NOT DETECTED NOT DETECTED Final   Coronavirus OC43 NOT DETECTED NOT DETECTED Final   Metapneumovirus NOT DETECTED NOT DETECTED Final   Rhinovirus / Enterovirus NOT DETECTED NOT DETECTED Final   Influenza A NOT DETECTED NOT DETECTED Final   Influenza B NOT DETECTED NOT DETECTED Final   Parainfluenza Virus 1 NOT DETECTED NOT DETECTED Final   Parainfluenza Virus 2 NOT DETECTED NOT DETECTED Final   Parainfluenza Virus 3 NOT DETECTED NOT DETECTED Final   Parainfluenza Virus 4 NOT DETECTED NOT DETECTED Final   Respiratory Syncytial Virus  NOT DETECTED NOT DETECTED Final   Bordetella pertussis NOT DETECTED NOT DETECTED Final   Bordetella Parapertussis NOT DETECTED NOT DETECTED Final   Chlamydophila pneumoniae NOT DETECTED NOT DETECTED Final   Mycoplasma pneumoniae NOT DETECTED NOT DETECTED Final    Comment: Performed at [facility], [place]. [address], [place], [place] [address]  Group A Strep by PCR [facility] Only)     Status: None   Collection Time: 05/31/21  8:35 AM   Specimen: Throat; Sterile Swab  Result Value Ref Range Status   Group A Strep by PCR NOT DETECTED NOT DETECTED Final    Comment: Performed at [facility], [address] [place], [place] [address]  Blood culture (routine x 2)     Status: None   Collection Time: 05/31/21  8:39 AM   Specimen: BLOOD  Result Value Ref Range Status   Specimen Description BLOOD BLOOD RIGHT FOREARM  Final   Special Requests   Final    BOTTLES DRAWN AEROBIC AND ANAEROBIC Blood Culture results may not be optimal due to an excessive volume of blood received in culture bottles   Culture   Final    NO GROWTH 5 DAYS Performed at [facility], [address], [place], [place] [address]    Report Status 06/05/2021 FINAL  Final  Blood culture (routine x 2)     Status: None   Collection Time: 05/31/21  8:39 AM   Specimen: BLOOD  Result Value Ref Range Status   Specimen Description BLOOD RIGHT ANTECUBITAL  Final   Special Requests   Final    BOTTLES DRAWN AEROBIC AND ANAEROBIC Blood Culture results may not be optimal due to an excessive volume of blood received in culture bottles   Culture   Final    NO GROWTH 5 DAYS Performed at [facility], [address] [place], [place] [address]    Report Status 06/05/2021 FINAL  Final  C Difficile Quick Screen w PCR reflex     Status: None   Collection Time: 05/31/21  1:20 PM   Specimen: Stool  Result Value Ref Range Status   C Diff antigen NEGATIVE NEGATIVE Final   C Diff toxin NEGATIVE NEGATIVE Final   C  Diff interpretation No C. difficile detected.  Final    Comment: Performed at [facility], [address] [place], [place] [address]  Gastrointestinal Panel by PCR , Stool     Status: None   Collection Time: 05/31/21  1:30 PM   Specimen: Stool  Result Value Ref Range Status   Campylobacter species NOT DETECTED NOT DETECTED Final   Plesimonas shigelloides NOT DETECTED NOT DETECTED Final   Salmonella species NOT DETECTED NOT DETECTED Final   Yersinia enterocolitica NOT DETECTED NOT DETECTED Final   Vibrio species NOT DETECTED NOT DETECTED Final   Vibrio cholerae NOT DETECTED NOT DETECTED Final   Enteroaggregative E coli (EAEC) NOT DETECTED NOT DETECTED Final   Enteropathogenic E coli (EPEC) NOT DETECTED NOT DETECTED Final   Enterotoxigenic E coli (ETEC) NOT DETECTED NOT DETECTED Final   Shiga like toxin producing E coli (STEC) NOT DETECTED NOT DETECTED Final   Shigella/Enteroinvasive E coli (EIEC) NOT DETECTED NOT DETECTED Final   Cryptosporidium NOT DETECTED NOT DETECTED Final   Cyclospora cayetanensis NOT DETECTED NOT DETECTED Final   Entamoeba histolytica NOT DETECTED NOT DETECTED Final   Giardia lamblia NOT DETECTED NOT DETECTED Final   Adenovirus F40/41 NOT DETECTED NOT DETECTED Final   Astrovirus NOT DETECTED NOT DETECTED Final   Norovirus GI/GII NOT DETECTED NOT DETECTED Final   Rotavirus A NOT DETECTED NOT DETECTED Final   Sapovirus (I, II, IV, and V) NOT DETECTED NOT DETECTED Final    Comment: Performed at [facility], [address] [place], [place] [address]  Urine Culture     Status: None   Collection Time: 06/01/21 10:23 AM   Specimen: In/Out Cath Urine  Result Value Ref Range Status   Specimen Description   Final    IN/OUT CATH URINE Performed at [facility], [address], [place], [place] [address]    Special Requests   Final    NONE Performed at [facility], [address], [place], [place] [address]    Culture    Final    NO GROWTH Performed at [facility], [address], [place], [place] [address]    Report Status 06/03/2021 FINAL  Final  Culture, Respiratory w Gram Stain     Status: None   Collection Time: 06/01/21  3:59 PM   Specimen: Tracheal Aspirate; Respiratory  Result Value Ref Range Status   Specimen Description   Final    TRACHEAL ASPIRATE Performed at [facility], [address] [place], [place] [address]    Special Requests   Final    NONE Performed at [facility], [address] [place], [place] [address]    Gram Stain   Final  RARE SQUAMOUS EPITHELIAL CELLS PRESENT MODERATE WBC PRESENT,BOTH PMN AND MONONUCLEAR MODERATE GRAM POSITIVE COCCI FEW GRAM NEGATIVE RODS FEW GRAM POSITIVE RODS RARE YEAST    Culture   Final    FEW Consistent with normal respiratory flora. No Pseudomonas species isolated Performed at [facility] [address], [place], [place] [address]    Report Status 06/03/2021 FINAL  Final  CULTURE, BLOOD (ROUTINE X 2) w Reflex to ID Panel     Status: None   Collection Time: 06/01/21  7:06 PM   Specimen: BLOOD  Result Value Ref Range Status   Specimen Description BLOOD RIGHT ANTECUBITAL  Final   Special Requests   Final    BOTTLES DRAWN AEROBIC ONLY Blood Culture results may not be optimal due to an inadequate volume of blood received in culture bottles   Culture   Final    NO GROWTH 5 DAYS Performed at [facility], [address] [place], [place] [address]    Report Status 06/06/2021 FINAL  Final  CULTURE, BLOOD (ROUTINE X 2) w Reflex to ID Panel     Status: None   Collection Time: 06/01/21  7:37 PM   Specimen: BLOOD  Result Value Ref Range Status   Specimen Description BLOOD [facility]  Final   Special Requests BOTTLES DRAWN AEROBIC AND ANAEROBIC BCLV  Final   Culture   Final    NO GROWTH 5 DAYS Performed at [facility] [place] [facility], [address], [place], [place] [address]    Report Status 06/06/2021  FINAL  Final  MRSA Next Gen by PCR, Nasal     Status: None   Collection Time: 06/02/21 10:46 AM   Specimen: Nasal Mucosa; Nasal Swab  Result Value Ref Range Status   MRSA by PCR Next Gen NOT DETECTED NOT DETECTED Final    Comment: (NOTE) The GeneXpert MRSA Assay (FDA approved for NASAL specimens only), is one component of a comprehensive MRSA colonization surveillance program. It is not intended to diagnose MRSA infection nor to guide or monitor treatment for MRSA infections. Test performance is not FDA approved in patients less than 59 years old. Performed at [facility], [address] [place], [place] [address]   Culture, Respiratory w Gram Stain     Status: None   Collection Time: 06/04/21  7:38 AM   Specimen: Tracheal Aspirate; Respiratory  Result Value Ref Range Status   Specimen Description   Final    TRACHEAL ASPIRATE Performed at [facility], [address], [place], [place] [address]    Special Requests   Final    NONE Performed at [facility], [address] [place], [place] [address]    Gram Stain   Final    MODERATE WBC PRESENT,BOTH PMN AND MONONUCLEAR NO ORGANISMS SEEN Performed at [facility], [place] [address], [place],  [address]    Culture FEW CANDIDA ALBICANS RARE ESCHERICHIA COLI   Final   Report Status 06/07/2021 FINAL  Final   Organism ID, Bacteria ESCHERICHIA COLI  Final      Susceptibility   Escherichia coli - MIC*    AMPICILLIN >=32 RESISTANT Resistant     CEFAZOLIN <=4 SENSITIVE Sensitive     CEFEPIME <=0.12 SENSITIVE Sensitive     CEFTAZIDIME <=1 SENSITIVE Sensitive     CEFTRIAXONE <=0.25 SENSITIVE Sensitive     CIPROFLOXACIN <=0.25 SENSITIVE Sensitive     GENTAMICIN <=1 SENSITIVE Sensitive     IMIPENEM <=0.25 SENSITIVE Sensitive  TRIMETH/SULFA >=320 RESISTANT Resistant     AMPICILLIN/SULBACTAM >=32 RESISTANT Resistant     PIP/TAZO <=4 SENSITIVE Sensitive     * RARE ESCHERICHIA COLI   CULTURE, BLOOD (ROUTINE X 2) w Reflex to ID Panel     Status: None (Preliminary result)   Collection Time: 06/04/21  6:35 PM   Specimen: BLOOD  Result Value Ref Range Status   Specimen Description BLOOD BLOOD LEFT HAND  Final   Special Requests   Final    BOTTLES DRAWN AEROBIC AND ANAEROBIC Blood Culture adequate volume   Culture   Final    NO GROWTH 4 DAYS Performed at [facility], [address] [place], [place] [address]    Report Status PENDING  Incomplete    Coagulation Studies: No results for input(s): LABPROT, INR in the last 72 hours.   Urinalysis: No results for input(s): COLORURINE, LABSPEC, PHURINE, GLUCOSEU, HGBUR, BILIRUBINUR, KETONESUR, PROTEINUR, UROBILINOGEN, NITRITE, LEUKOCYTESUR in the last 72 hours.  Invalid input(s): APPERANCEUR     Imaging: No results found.   Medications:     prismasol BGK 4/2.5 300 mL/hr at 06/08/21 0607    prismasol BGK 4/2.5 300 mL/hr at 06/08/21 0552   amiodarone 30 mg/hr (06/08/21 1000)    ceFAZolin (ANCEF) IV     dexmedetomidine (PRECEDEX) IV infusion Stopped (06/08/21 0952)   feeding supplement (VITAL 1.5 CAL) Stopped (06/08/21 0900)   fentaNYL infusion INTRAVENOUS 100 mcg/hr (06/08/21 1000)   norepinephrine (LEVOPHED) Adult infusion 3 mcg/min (06/08/21 1000)   phenylephrine (NEO-SYNEPHRINE) Adult infusion 100 mcg/min (06/08/21 1000)   prismasol BGK 2/2.5 dialysis solution Stopped (06/03/21 1756)   prismasol BGK 2/2.5 replacement solution Stopped (06/03/21 1756)   prismasol BGK 2/2.5 replacement solution Stopped (06/03/21 1756)   prismasol BGK 4/2.5 1,200 mL/hr at 06/08/21 0533   propofol (DIPRIVAN) infusion Stopped (06/07/21 1038)   vasopressin 0.02 Units/min (06/08/21 1000)    albuterol  2.5 mg Nebulization Q6H   chlorhexidine gluconate (MEDLINE KIT)  15 mL Mouth Rinse BID   Chlorhexidine Gluconate Cloth  6 each Topical Daily   docusate  100 mg Per Tube BID   feeding supplement (PROSource TF)  45 mL Per  Tube TID   free water  30 mL Per Tube Q4H   hydrocortisone sod succinate (SOLU-CORTEF) inj  50 mg Intravenous Q6H   insulin aspart  3-9 Units Subcutaneous Q4H   lidocaine  1 patch Transdermal Q24H   mouth rinse  15 mL Mouth Rinse 10 times per day   metoCLOPramide (REGLAN) injection  5 mg Intravenous Q8H   midodrine  10 mg Per Tube TID WC   multivitamin  1 tablet Per Tube QHS   nutrition supplement (JUVEN)  1 packet Per Tube BID BM   pantoprazole sodium  40 mg Per Tube Daily   polyethylene glycol  17 g Per Tube Daily   fentaNYL, guaiFENesin-dextromethorphan, heparin, ipratropium-albuterol, lip balm, ondansetron **OR** ondansetron (ZOFRAN) IV, oxyCODONE, polyvinyl alcohol, sodium chloride  Assessment/ Plan:  [name] is a 26 y.o. black male with hypertension, insulin dependent diabetes mellitus type I, diabetic gastroparesis, diabetic neuropathy, tobacco use, THC use, right toe amputation, perirectal abscess, who is admitted to [facility] on 05/31/2021 for SIRS (systemic inflammatory response syndrome) ([place]) [R65.10] AKI (acute kidney injury) ([place]) [N17.9] Symptomatic anemia [D64.9] Acute renal failure superimposed on stage 5 chronic kidney disease, not on chronic dialysis, unspecified acute renal failure type ([place]) [N17.9, N18.5] Hypertension, unspecified type [I10] Acute cough [R05.1]  Cardiac arrest with code  blue 06/01/2021 at 10 am. Transferred to ICU. Intubated  Acute kidney injury on chronic kidney disease stage IV versus progression of chronic kidney disease to end stage renal disease. Baseline creatinine of 4.13, GFR of 19 on 08/26/20. History of nephrotic range proteinuria and hematuria. No history of renal biopsy. Strong family history of dialysis with mother with ESRD before passing.  - remains hemodynamically unstable.  - Continue CVVHDF Pre filter and post filter replacement 300 ml/hr Dialysate 1200 cc/min Net Fluid removal rate 0 cc/hr ICU team planning to move cathter  to left IJ    Acute respiratory failure Vent assisted, suspected to have aspirated Extubated then re-intubated 06/03/2021  Hypotension with cardiogenic shock - requiring vasopressors.   4. Insulin dependent Diabetes type 1 with CKD and proteinuria   Lab Results  Component Value Date   HGBA1C 5.7 (H) 05/31/2021    5. Thrombocytopenia Hematology evaluation on 1/22 Suspected peripheral destruction Work up in progress Got platelet infusion on January 25.    LOS: 8 Shahrukh Pasch 1/26/202310:20 AM

## 2021-06-08 NOTE — Progress Notes (Signed)
Palliative:  HPI: 26 y.o. male  with past medical history of diabetes, CKD stage 4, hypertension, perirectal abscess, gastroparesis admitted on 05/31/2021 with 3 days of fever, chills, nonproductive cough, myalgias, diarrhea. Complicated hospitalization with asystole cardiac arrest with ongoing need for vent and vasopressor support, CRRT, heart failure EF 35%, thrombocytopenia.    I met today at Fernie's bedside along with his aunt 3. Tammy shares with me that she loves her nephew and that he has such a good soul but that he is stubborn. She has tried to get him to take better care of himself and take his medication and she says that he will for awhile but then he gets tired of it. We discussed his dialysis and she says that he was aware that he would need dialysis one day so this will not be shocking. She does feel that he will go to dialysis as indicated but as far as following diet recommendations and taking medications he often does not stick with this for too long. Tammy expresses deep faith and hope that Rowan will continue to improve. They can live with him needing dialysis and he is making some improvements with less vasopressor support and hopeful for extubation today. They wish to continue with full aggressive care with hopes of improvement.   Exam: Sedated on vent. No distress. CRRT running. HR stable. On Levo and vasopressin (off neo). Abd flat. Warm to touch.   Plan: - Goals clear. Full code, full scope. Hopeful for continued improvement.   25 min   Vinie Sill, NP Palliative Medicine Team Pager [phone] (Please see [email or web address] for schedule) Team Phone [phone]    Greater than 50%  of this time was spent counseling and coordinating care related to the above assessment and plan

## 2021-06-08 NOTE — TOC Progression Note (Signed)
Transition of Care [facility]) - Progression Note    Patient Details  Name: Jonathan Mcmillan MRN: [id number] Date of Birth: 30-Jun-1995  Transition of Care [facility]) CM/SW Contact  Shelbie Hutching, RN Phone Number: 06/08/2021, 10:08 AM  Clinical Narrative:    Patient is awake today, per bedside RN wanting to try and write.  Plan to wean vent and potentially extubate.   TOC will follow.     Expected Discharge Plan:  (TBD) Barriers to Discharge: Continued Medical Work up  Expected Discharge Plan and Services Expected Discharge Plan:  (TBD)   Discharge Planning Services: CM Consult   Living arrangements for the past 2 months: Single Family Home                                       Social Determinants of Health (SDOH) Interventions    Readmission Risk Interventions Readmission Risk Prevention Plan 06/13/2020  Transportation Screening Complete  Palliative Care Screening Not Applicable  Medication Review (RN Care Manager) Complete  Some recent data might be hidden

## 2021-06-08 NOTE — Procedures (Signed)
Hemodialysis Catheter Insertion Procedure Note Jonathan Mcmillan [id number] Jul 15, 1995  Procedure: Insertion of Hemodialysis Catheter Indications: Hemodialysis  Procedure Details Consent: Risks of procedure as well as the alternatives and risks of each were explained to the (patient/caregiver).  Consent for procedure obtained.  Time Out: Verified patient identification, verified procedure, site/side was marked, verified correct patient position, special equipment/implants available, medications/allergies/relevent history reviewed, required imaging and test results available.  Performed  Maximum sterile technique was used including antiseptics, cap, gloves, gown, hand hygiene, mask, and sheet.  Skin prep: Chlorhexidine; local anesthetic administered  A Trialysis HD catheter was placed in the left internal jugular vein using the Seldinger technique.  Evaluation Blood flow good Complications: No apparent complications Patient did tolerate procedure well. Chest X-ray ordered to verify placement.  CXR: pending.   Procedure performed under direct supervision of Dr. Mortimer Fries and with ultrasound guidance for real time vessel cannulation.      Line secured at the 20 cm mark. BIOPATCH applied to the insertion site.   Darel Hong, AGACNP-BC  Pulmonary & Critical Care Prefer epic messenger for cross cover needs If after hours, please call E-link

## 2021-06-08 NOTE — Consult Note (Signed)
[facility] for Electrolyte Monitoring and Replacement   Recent Labs: Potassium (mmol/L)  Date Value  06/08/2021 3.6  06/08/2021 3.5  08/05/2013 3.8   Magnesium (mg/dL)  Date Value  06/08/2021 2.4   Calcium (mg/dL)  Date Value  06/08/2021 6.7 (L)  06/08/2021 6.6 (L)   Calcium, Total (PTH) (mg/dL)  Date Value  06/12/2020 8.0   Albumin (g/dL)  Date Value  06/08/2021 1.7 (L)  08/05/2013 2.9 (L)   Phosphorus (mg/dL)  Date Value  06/08/2021 2.6  06/08/2021 2.6   Sodium (mmol/L)  Date Value  06/08/2021 137  06/08/2021 136  08/05/2013 133 (L)   Assessment: Jonathan Mcmillan is a 26 y.o. male with medical history including diabetes, CKD, HTN, nicotine dependence admitted on 05/31/2021 with  AKI on CKD, anemia, and fevers/chills/myalgias . Patient suffered cardiac arrest on the morning of 1/19 and was ultimately intubated and transferred to the ICU. Patient is currently on CRRT per nephrology. Pharmacy has been consulted to monitor and replace electrolytes.   Goal of Therapy:  Electrolytes within normal limits  Plan:  --Remains on CRRT --No replacement indicated at this time --Continue to monitor renal function panels BID while on CRRT  Tawnya Crook, PharmD, BCPS Clinical Pharmacist 06/08/2021 1:46 PM

## 2021-06-08 NOTE — Progress Notes (Signed)
Pt extubated and placed on bubble high flow per dr Mortimer Fries. No stridor noted, respiratory rate 16/min in no distress.

## 2021-06-08 NOTE — Progress Notes (Signed)
NAME:  Jonathan Mcmillan, MRN:  [id number], DOB:  1996-01-17, LOS: 8 ADMISSION DATE:  05/31/2021, CONSULTATION DATE:  06/01/2021 REFERRING MD:  Dr. Leslye Peer, CHIEF COMPLAINT:  Cardiac Arrest   Brief Pt Description / Synopsis:  26 y.o. admitted with AKI on CKD in setting of viral syndrome and diarrhea.  Suffered in-hospital cardiac arrest (initial rhythm asystole), suspect due to severe metabolic acidosis and multiple metabolic derangements.  History of Present Illness:  Jonathan Mcmillan is a 26 year old male with a past medical history significant for diabetes mellitus type 1, chronic kidney disease stage IV, hypertension, nicotine dependence who presented to [facility] ED on 05/31/2021 due to 3-day history of fever, chills, nonproductive cough, myalgias, and diarrhea. Patient denied urinary frequency, nocturia, dysuria, abdominal pain, hematemesis, chest pain, shortness of breath, edema.  ED Course: Initial vital signs: Temperature 102.6, RR 22, HR 104, BP 139/70, SpO2 97% on room air Significant labs: Sodium 131, potassium 4.4, chloride 103, bicarb 15, glucose 177, BUN 19, creatinine 13.86 (baseline of 3.92), calcium 6.7, alkaline phosphatase 70, lipase 29, AST 24, ALT 33, lactic acid 2.1, WBC 12.7, hemoglobin 5.2 (baseline of 8.3) hematocrit 15.8, MCV 89.8, RDW 12.8, platelets 172, PT 15.8, INR 1.3. COVID-19 and influenza PCR negative Respiratory viral panel negative Urinalysis with proteinuria Imaging: Chest x-ray negative for acute cardiopulmonary disease CT Chest w/o contrast>>1. Trace pericardial effusion and mild diffuse body wall edema, new since 01/02/2020. 2. Clear lungs, with no focal consolidation or pleural effusion. 3. Hypodense blood pool consistent with anemia. Renal US>>Increased renal echogenicity compatible with medical renal disease. Retroperitoneal perinephric edema suspect related to volume overload.  Patient was admitted by the hospitalist for further work-up and treatment of  acute kidney injury on CKD.  Nephrology was consulted.  Interval History Early in the morning on 06/01/2021 rapid response was called when he was found in the shower on a chair passed out. Found to be  hypotensive, hypoglycemic, and lethargic with his "eyes rolling in the back of his head."  Patient was given 500 cc fluid bolus and ordered for blood transfusion.  However CODE BLUE was called as pt was found to be in asystole (progressed to PEA).  He received epinephrine x2, sodium bicarbonate x2, calcium x1, glucose and insulin.  Pulse was regained and he was transferred to ICU.  Upon arrival to the ICU patient patient is critically ill and concern for decorticate posturing.  PCCM consulted for further management.  Pertinent  Medical History  Chronic Kidney Disease Stage IV Diabetes Mellitus 1  Micro Data:  05/31/2021: SARS-CoV-2 and influenza PCR>> negative 05/31/2021: Respiratory viral panel>> negative 05/31/2021: Group A strep PCR>> not detected 05/31/2021: Blood culture x2>> no growth to date 05/31/2021: GI panel>> negative 05/31/2021: C. Difficile>> negative 06/01/2021: Tracheal aspirate>> normal respiratory flora 06/01/2021: Urine>>no growth 06/01/2021: Blood culture>>no growth 06/02/2021: MRSA PCR >>negative 06/04/2021: Tracheal aspirate>> E. Coli (resistant to ampicillin, Unasyn, Bactrim) 06/04/2021: Blood culture>> NGTD 06/04/2021: Blood Fungus>>  Antimicrobials:  Ceftriaxone 1/19 x 1 dose Vancomycin 1/19>>1/21 Zosyn 1/19>>1/22 Flagyl 1/22>>1/23 Unasyn 1/23>>1/25 Cefepime 1/23 x1 dose, restarted 1/25>>1/26 Cefazolin 1/26>>  Significant Hospital Events: Including procedures, antibiotic start and stop dates in addition to other pertinent events   1/18: Admitted by the hospitalist.  Nephrology consulted, plans for vascular to place temporary dialysis catheter with initiation of dialysis 1/19: In-hospital cardiac arrest.  Critically ill. Transferred to ICU.  Plan for CRRT.  Central line,  A line, and Trialysis catheter placed.  Concern for possible anoxic brain injury.  Sepsis workup in progress 1/20: Pt is awake, tracking, following simple commands. Remains on CRRT, bicarb gtt d/c.  Weaning fiO2 and pressors. Tracheal aspirate growing gram + cocci, gram + rods, gram - rods 1/21: Extubated; leukocytosis, thrombocytopenia 1/22: Reintubated overnight; apparent large volume aspiration; worsening shock 1/23 remains with multiorgan failure, plt transfusion 1/24 severe multiorgan failure 1/26:  Plan to place Trialysis in IJ and remove femoral, transfuse 1 unit plt prior to procedure. transfuse 1 unit prbc (hgb 6.7).  Tolerating PSV, hopeful for liberation from vent following line placement.  ABX changed to Cefazolin due to Cx/S.  Interim History / Subjective:  -No significant events reported overnight -Afebrile, continues to require Neo (150 mcg) & Vasopressin, Levophed, -maintaining NSR w/ Amiodarone gtt -On PSV 8/5 and tolerating, RASS -1 on Precedex and Fentanyl, able to arouse and follow commands -Hgb 6.7 this am, will give 1 unit pRBCs -Pt's Trialysis, central line, and A-line all located in the groin and have been in place for about 1 week ~ will plan to place new HD catheter in IJ with consideration for removal other A-line and central line -Will give 1 units of Platelets prior to procedure given PLT count 17K (21 K yesterday) -Discussed with Nephrology, requests that new HD line be placed in left IJ, and that right IJ be left open for eventual permcath -Will change Cefepime to Cefazolin based on TA culture and sensitivities  Objective   Blood pressure 97/75, pulse 77, temperature (!) 97.3 F (36.3 C), resp. rate 18, height 5' 7.99" (1.727 m), weight 67.4 kg, SpO2 100 %.    Vent Mode: PSV;CPAP FiO2 (%):  [30 %] 30 % Set Rate:  [20 bmp] 20 bmp Vt Set:  [450 mL] 450 mL PEEP:  [5 cmH20] 5 cmH20 Pressure Support:  [8 cmH20] 8 cmH20 Plateau Pressure:  [14 cmH20] 14 cmH20    Intake/Output Summary (Last 24 hours) at 06/08/2021 9735 Last data filed at 06/08/2021 0800 Gross per 24 hour  Intake 4421.34 ml  Output 3896.6 ml  Net 524.74 ml    Filed Weights   06/06/21 0417 06/07/21 0500 06/08/21 0437  Weight: 68 kg 67.9 kg 67.4 kg    Examination: General: Acutely ill-appearing male, intubated, lightly sedated, in NAD Lungs: Coarse breath sounds bilaterally, no wheezing, in PSV and tolerating, even, on assessory muscle use Cardiovascular: regular rate and rhythm, no MRG Abdomen: Soft, nontender, nondistended, no guarding or rebound tenderness, +BS Extremities: 1+ pitting edema to shins bilaterally Neuro: RASS 0, moving all extremities to command, nonf-ocal exam GU: Foley catheter in place with minimal urine output  Resolved Hospital Problem list     Assessment & Plan:   Acute hypoxic respiratory failure in setting of Cardiac Arrest, Aspiration Pneumonia & New HFrEF -Full vent support, implement lung protective strategies -Plateau pressures less than 30 cm H20 -Wean FiO2 & PEEP as tolerated to maintain O2 sats >92% -Follow intermittent Chest X-ray & ABG as needed -Spontaneous Breathing Trials when respiratory parameters met and mental status permits -Implement VAP Bundle -Prn Bronchodilators -Continue ABX as above -Volume removal with HD/CRRT  Septic shock +/- Cardiogenic Shock New HFrEF: -Echocardiogram 06/01/21>>LVEF 35-40% and global hypokinesis, grade II diastolic dysfunction, mildly reduced RV function, and moderately increased Pulmonary systolic pressure In-Hospital-Cardiac Arrest due to severe metabolic acidosis and multiple metabolic derangements Mildly elevated troponin due to demand ischemia PMHx: Hypertension -Continuous cardiac monitoring -Maintain MAP >65 -Vasopressors as needed to maintain MAP goal -Transfusions as indicated -Continue Midodrine -Stress dose steroids -HS  Troponin peaked at 386 -Cardiology following, appreciate  input  Severe Sepsis due to Aspiration Pneumonia (E.coli) -Monitor fever curve -Trend WBC's & Procalcitonin -Follow cultures as above -Narrow to Cefazolin on 1/16 based on cultures & sensitivities  Anemia of Chronic Disease Thrombocytopenia (4T score is 3-4) in setting of Sepsis -Monitor for S/Sx of bleeding -Trend CBC -SCD's for VTE Prophylaxis  -Transfuse for Hgb <7 -Transfuse Platelets for platelet count <10K  -Hematology following, appreciate input -Workup negative for DIC or HIT  AKI on CKD Stage IV -Monitor I&O's / urinary output -Follow BMP -Ensure adequate renal perfusion -Avoid nephrotoxic agents as able -Replace electrolytes as indicated -Nephrology following, appreciate input ~ Continues on CRRT  PMHx: DM type I -CBG's q4h; Target range of 140 to 180 -SSI -Follow ICU Hypo/Hyperglycemia protocol   Pt is critically ill with multiorgan failure.  Prognosis is guarded, high risk for further cardiac arrest and death.   Best Practice (right click and "Reselect all SmartList Selections" daily)   Diet/type: NPO, tube feeds DVT prophylaxis: SCD (holding chemical prophylaxis due to thrombocytopenia) GI prophylaxis: PPI Lines: Central line and yes and it is still needed Foley:  Yes, and it is still needed Code Status:  full code Last date of multidisciplinary goals of care discussion [06/08/21]  Pt's aunt Rolan Wrightsman updated at bedside 1/26.  All questions answered.  Labs   CBC: Recent Labs  Lab 06/04/21 0936 06/04/21 1126 06/04/21 2308 06/05/21 0417 06/06/21 0423 06/07/21 0435 06/07/21 2004 06/08/21 0407  WBC 24.1*   < > 29.3* 30.3* 32.5* 32.6* 34.2* 31.7*  NEUTROABS 19.3*  --  21.9* 26.2*  --  27.6* 26.5*  --   HGB 8.9*   < > 8.7* 8.3* 7.9* 7.8* 7.3* 6.7*  HCT 25.4*   < > 24.8* 23.8* 22.6* 22.7* 20.9* 18.5*  MCV 84.9   < > 86.1 86.9 86.3 87.6 85.3 82.6  PLT 10*   < > 29* 15* 11* 9* 21* 17*   < > = values in this interval not displayed.     Basic  Metabolic Panel: Recent Labs  Lab 06/04/21 0234 06/04/21 0508 06/05/21 0410 06/05/21 1610 06/06/21 0420 06/06/21 1530 06/07/21 0435 06/07/21 1530 06/08/21 0407  NA  --    < > 133*   < > 137 137 136   135 138 137   136  K  --    < > 3.5   < > 3.7 3.7 3.7   3.7 3.6 3.6   3.5  CL  --    < > 104   < > 104 104 105   104 104 106   107  CO2  --    < > 22   < > _0 GLUCOSE  --    < > 182*   < > 147* 138* 196*   194* 93 158*   157*  BUN  --    < > 17   < > _1 28*   27*  CREATININE  --    < > 1.42*   < > 1.24 1.08 1.05   0.99 1.02 1.19   1.21  CALCIUM  --    < > 7.2*   < > 7.4* 7.3* 7.2*   7.2* 7.0* 6.7*   6.6*  MG 1.9  --  2.2  --  2.4  --  2.5*  --  2.4  PHOS  --    < > 1.7*   < > 1.6* 1.9* 1.9*   1.9* 3.0 2.6   2.6   < > = values in this interval not displayed.    GFR: Estimated Creatinine Clearance: 88.9 mL/min (by C-G formula based on SCr of 1.19 mg/dL). Recent Labs  Lab 06/02/21 1123 06/03/21 0307 06/04/21 0016 06/04/21 0158 06/04/21 0210 06/04/21 0234 06/04/21 0936 06/04/21 1126 06/05/21 0410 06/05/21 0417 06/06/21 0420 06/06/21 0423 06/07/21 0435 06/07/21 2004 06/08/21 0407  PROCALCITON  --  67.49  --  34.14  --   --   --   --  49.08  --  39.56  --   --   --   --   WBC  --  25.9* 22.5*  --   --    < > 24.1*   < >  --    < >  --  32.5* 32.6* 34.2* 31.7*  LATICACIDVEN 4.3*  --  2.8*  --  2.8*  --  3.3*  --   --   --   --   --   --   --   --    < > = values in this interval not displayed.     Liver Function Tests: Recent Labs  Lab 06/01/21 1325 06/01/21 1937 06/03/21 1416 06/04/21 0016 06/04/21 0508 06/06/21 0420 06/06/21 1530 06/07/21 0435 06/07/21 1530 06/08/21 0407  AST 101*  --  21 18  --   --   --   --   --   --   ALT 78*  --  42 39  --   --   --   --   --   --   ALKPHOS 68  --  91 89  --   --   --   --   --   --   BILITOT 1.1  --  2.2* 2.3*  --   --   --   --   --   --   PROT 4.7*  --  4.4* 4.6*  --   --   --    --   --   --   ALBUMIN 2.0*   < > 1.7*   1.7* 1.8*   < > 1.7* 1.6* 1.7* 1.7* 1.7*   < > = values in this interval not displayed.    Recent Labs  Lab 06/08/21 0407  LIPASE 16    Recent Labs  Lab 06/04/21 0016  AMMONIA 23     ABG    Component Value Date/Time   PHART 7.35 06/06/2021 1038   PCO2ART 46 06/06/2021 1038   PO2ART 124 (H) 06/06/2021 1038   HCO3 25.4 06/06/2021 1038   TCO2 22 06/24/2007 0045   ACIDBASEDEF 0.3 06/06/2021 1038   O2SAT 98.6 06/06/2021 1038      Coagulation Profile: Recent Labs  Lab 06/01/21 1715 06/04/21 0234  INR 2.3* 1.3*     Cardiac Enzymes: No results for input(s): CKTOTAL, CKMB, CKMBINDEX, TROPONINI in the last 168 hours.   HbA1C: Hemoglobin-A1c  Date/Time Value Ref Range Status  06/26/2007 06:40 AM   Final   17.6% HIGH (NOTE) Reference Intervals:  Diabetic Adult        <9.0 Healthy Adult         3.9 - 7.3 Current ADA guidelines recommend a treatment goal of <7.0% HgbA1c for diabetic patients, which corresponds to a <9.0% Glycohemoglobin result with this method.   Performed at:  [facility]  [place], [place]  [address]   Hemoglobin A1C  Date/Time Value Ref Range Status  08/05/2013 01:26 PM 12.9 (H) 4.2 - 6.3 % Final    Comment:    The American Diabetes Association recommends that a primary goal of therapy should be <7% and that physicians should reevaluate the treatment regimen in patients with HbA1c values consistently >8%.    Hgb A1c MFr Bld  Date/Time Value Ref Range Status  05/31/2021 12:05 PM 5.7 (H) 4.8 - 5.6 % Final    Comment:    RESULTS CONFIRMED BY MANUAL DILUTION REPEATED TO VERIFY (NOTE) Pre diabetes:          5.7%-6.4%  Diabetes:              >6.4%  Glycemic control for   <7.0% adults with diabetes   06/11/2020 03:41 AM 7.0 (H) 4.8 - 5.6 % Final    Comment:    (NOTE) Pre diabetes:          5.7%-6.4%  Diabetes:              >6.4%  Glycemic control for    <7.0% adults with diabetes     CBG: Recent Labs  Lab 06/07/21 1132 06/07/21 1521 06/07/21 2041 06/07/21 2333 06/08/21 0421  GLUCAP 164* 90 148* 192* 143*     Review of Systems:   Unable to assess due to intubation, AMS, critical illness   Past Medical History:  He,  has a past medical history of Cannabinoid hyperemesis syndrome, Diabetes 1.5, managed as type 1 ([place]), Gastroparesis, and Perirectal abscess (06/16/2017).   Surgical History:   Past Surgical History:  Procedure Laterality Date   INCISION AND DRAINAGE PERIRECTAL ABSCESS N/A 06/16/2017   Procedure: IRRIGATION AND DEBRIDEMENT PERIRECTAL ABSCESS;  Surgeon: Florene Glen, MD;  Location: [facility] ORS;  Service: General;  Laterality: N/A;   none       Social History:   reports that he has been smoking cigarettes. He has been smoking an average of .5 packs per day. He has never used smokeless tobacco. He reports current drug use. Drug: Marijuana. He reports that he does not drink alcohol.   Family History:  His family history includes Diabetes in his mother.   Allergies No Known Allergies   Home Medications  Prior to Admission medications   Medication Sig Start Date End Date Taking? Authorizing Provider  ondansetron (ZOFRAN) 4 MG tablet Take 1 tablet (4 mg total) by mouth every 8 (eight) hours as needed for up to 10 doses for nausea or vomiting. 05/31/21  Yes Lucrezia Starch, MD  amLODipine (NORVASC) 10 MG tablet Take 1 tablet (10 mg total) by mouth daily. Patient not taking: Reported on 06/01/2021 06/14/20   Fritzi Mandes, MD  hydrALAZINE (APRESOLINE) 50 MG tablet Take 1 tablet (50 mg total) by mouth 2 (two) times daily. Patient not taking: Reported on 06/01/2021 06/13/20   Fritzi Mandes, MD  insulin glargine (LANTUS) 100 UNIT/ML injection Inject 20 Units into the skin at bedtime. Patient not taking: Reported on 06/01/2021 08/26/20   [provider]  omeprazole (PRILOSEC) 40 MG capsule Take 1 capsule (40 mg total)  by mouth daily. 03/08/20 05/07/20  Arta Silence, MD  pantoprazole (PROTONIX) 40 MG tablet Take 1 tablet (40 mg total) by mouth daily. Patient not taking: Reported on 12/10/2018 09/10/18 01/02/20  Hillary Bow, MD  sucralfate (CARAFATE) 1 g tablet  Take 1 tablet (1 g total) by mouth 4 (four) times daily. Patient not taking: Reported on 06/26/2019 02/04/19 01/02/20  Nance Pear, MD     Critical care time: 63 minutes     Bradly Bienenstock, MD 06/08/21 8:24 AM   [facility] Prefer epic messenger for cross cover needs If after hours, please call E-link

## 2021-06-09 ENCOUNTER — Inpatient Hospital Stay: Payer: Medicaid Other

## 2021-06-09 LAB — CBC
HCT: 15.8 % — ABNORMAL LOW (ref 39.0–52.0)
Hemoglobin: 5.6 g/dL — ABNORMAL LOW (ref 13.0–17.0)
MCH: 30.4 pg (ref 26.0–34.0)
MCHC: 35.4 g/dL (ref 30.0–36.0)
MCV: 85.9 fL (ref 80.0–100.0)
Platelets: 23 10*3/uL — CL (ref 150–400)
RBC: 1.84 MIL/uL — ABNORMAL LOW (ref 4.22–5.81)
RDW: 14.9 % (ref 11.5–15.5)
WBC: 24.8 10*3/uL — ABNORMAL HIGH (ref 4.0–10.5)
nRBC: 0.4 % — ABNORMAL HIGH (ref 0.0–0.2)

## 2021-06-09 LAB — MAGNESIUM: Magnesium: 2.4 mg/dL (ref 1.7–2.4)

## 2021-06-09 LAB — RENAL FUNCTION PANEL
Albumin: 1.7 g/dL — ABNORMAL LOW (ref 3.5–5.0)
Albumin: 1.8 g/dL — ABNORMAL LOW (ref 3.5–5.0)
Anion gap: 5 (ref 5–15)
Anion gap: 12 (ref 5–15)
BUN: 32 mg/dL — ABNORMAL HIGH (ref 6–20)
BUN: 31 mg/dL — ABNORMAL HIGH (ref 6–20)
CO2: 25 mmol/L (ref 22–32)
CO2: 24 mmol/L (ref 22–32)
Calcium: 6.6 mg/dL — ABNORMAL LOW (ref 8.9–10.3)
Calcium: 7.3 mg/dL — ABNORMAL LOW (ref 8.9–10.3)
Chloride: 104 mmol/L (ref 98–111)
Chloride: 103 mmol/L (ref 98–111)
Creatinine, Ser: 1.24 mg/dL (ref 0.61–1.24)
Creatinine, Ser: 1.34 mg/dL — ABNORMAL HIGH (ref 0.61–1.24)
GFR, Estimated: >60 mL/min (ref >60)
GFR, Estimated: >60 mL/min (ref >60)
Glucose, Bld: 93 mg/dL (ref 70–99)
Glucose, Bld: 167 mg/dL — ABNORMAL HIGH (ref 70–99)
Phosphorus: 2.1 mg/dL — ABNORMAL LOW (ref 2.5–4.6)
Phosphorus: 2.5 mg/dL (ref 2.5–4.6)
Potassium: 3.8 mmol/L (ref 3.5–5.1)
Potassium: 4.1 mmol/L (ref 3.5–5.1)
Sodium: 134 mmol/L — ABNORMAL LOW (ref 135–145)
Sodium: 139 mmol/L (ref 135–145)

## 2021-06-09 LAB — GLUCOSE, CAPILLARY
Glucose-Capillary: 123 mg/dL — ABNORMAL HIGH (ref 70–99)
Glucose-Capillary: 126 mg/dL — ABNORMAL HIGH (ref 70–99)
Glucose-Capillary: 162 mg/dL — ABNORMAL HIGH (ref 70–99)
Glucose-Capillary: 175 mg/dL — ABNORMAL HIGH (ref 70–99)
Glucose-Capillary: 175 mg/dL — ABNORMAL HIGH (ref 70–99)
Glucose-Capillary: 201 mg/dL — ABNORMAL HIGH (ref 70–99)
Glucose-Capillary: 90 mg/dL (ref 70–99)

## 2021-06-09 LAB — BPAM PLATELET PHERESIS
Blood Product Expiration Date: 202301272359
ISSUE DATE / TIME: 202301261039
Unit Type and Rh: 5100

## 2021-06-09 LAB — HEMOGLOBIN AND HEMATOCRIT, BLOOD
HCT: 23.2 % — ABNORMAL LOW (ref 39.0–52.0)
Hemoglobin: 8.3 g/dL — ABNORMAL LOW (ref 13.0–17.0)

## 2021-06-09 LAB — RETIC PANEL
Immature Retic Fract: 40.5 % — ABNORMAL HIGH (ref 2.3–15.9)
RBC.: 1.85 MIL/uL — ABNORMAL LOW (ref 4.22–5.81)
Retic Count, Absolute: 12.6 10*3/uL — ABNORMAL LOW (ref 19.0–186.0)
Retic Ct Pct: 0.7 % (ref 0.4–3.1)
Reticulocyte Hemoglobin: 30.9 pg (ref >27.9)

## 2021-06-09 LAB — PHOSPHORUS: Phosphorus: 2 mg/dL — ABNORMAL LOW (ref 2.5–4.6)

## 2021-06-09 LAB — BASIC METABOLIC PANEL
Anion gap: 6 (ref 5–15)
BUN: 32 mg/dL — ABNORMAL HIGH (ref 6–20)
CO2: 25 mmol/L (ref 22–32)
Calcium: 6.7 mg/dL — ABNORMAL LOW (ref 8.9–10.3)
Chloride: 105 mmol/L (ref 98–111)
Creatinine, Ser: 1.31 mg/dL — ABNORMAL HIGH (ref 0.61–1.24)
GFR, Estimated: >60 mL/min (ref >60)
Glucose, Bld: 92 mg/dL (ref 70–99)
Potassium: 3.8 mmol/L (ref 3.5–5.1)
Sodium: 136 mmol/L (ref 135–145)

## 2021-06-09 LAB — PREPARE RBC (CROSSMATCH)

## 2021-06-09 LAB — HEPATIC FUNCTION PANEL
ALT: 29 U/L (ref 0–44)
AST: 25 U/L (ref 15–41)
Albumin: 1.6 g/dL — ABNORMAL LOW (ref 3.5–5.0)
Alkaline Phosphatase: 194 U/L — ABNORMAL HIGH (ref 38–126)
Bilirubin, Direct: 1.8 mg/dL — ABNORMAL HIGH (ref 0.0–0.2)
Indirect Bilirubin: 0.9 mg/dL (ref 0.3–0.9)
Total Bilirubin: 2.7 mg/dL — ABNORMAL HIGH (ref 0.3–1.2)
Total Protein: 4.8 g/dL — ABNORMAL LOW (ref 6.5–8.1)

## 2021-06-09 LAB — PREPARE PLATELET PHERESIS: Unit division: 0

## 2021-06-09 LAB — CULTURE, BLOOD (ROUTINE X 2)
Culture: NO GROWTH
Special Requests: ADEQUATE

## 2021-06-09 LAB — SEDIMENTATION RATE: Sed Rate: 37 mm/hr — ABNORMAL HIGH (ref 0–15)

## 2021-06-09 LAB — C-REACTIVE PROTEIN: CRP: 9.7 mg/dL — ABNORMAL HIGH (ref <1.0)

## 2021-06-09 MED ORDER — OXYCODONE HCL 5 MG PO TABS
5.0000 mg | ORAL_TABLET | ORAL | Status: DC | PRN
  Administered 2021-06-09 – 2021-06-10 (×4): 5 mg
  Filled 2021-06-09 (×4): qty 1

## 2021-06-09 MED ORDER — THIAMINE HCL 100 MG/ML IJ SOLN
100.0000 mg | INTRAMUSCULAR | Status: DC
  Administered 2021-06-12 – 2021-06-15 (×4): 100 mg via INTRAVENOUS
  Filled 2021-06-09 (×4): qty 2

## 2021-06-09 MED ORDER — PROSOURCE TF PO LIQD
45.0000 mL | Freq: Every day | ORAL | Status: DC
  Administered 2021-06-10: 45 mL
  Filled 2021-06-09: qty 45

## 2021-06-09 MED ORDER — SODIUM CHLORIDE 0.9% IV SOLUTION: Freq: Once | INTRAVENOUS | Status: AC

## 2021-06-09 MED ORDER — SODIUM PHOSPHATES 45 MMOLE/15ML IV SOLN
20.0000 mmol | Freq: Once | INTRAVENOUS | Status: AC
  Administered 2021-06-09: 20 mmol via INTRAVENOUS
  Filled 2021-06-09: qty 6.67

## 2021-06-09 MED ORDER — HYDROCERIN EX CREA
TOPICAL_CREAM | Freq: Two times a day (BID) | CUTANEOUS | Status: DC
  Filled 2021-06-09 (×4): qty 113

## 2021-06-09 MED ORDER — PANTOPRAZOLE SODIUM 40 MG IV SOLR
40.0000 mg | INTRAVENOUS | Status: DC
  Administered 2021-06-09 – 2021-06-11 (×3): 40 mg via INTRAVENOUS
  Filled 2021-06-09 (×3): qty 40

## 2021-06-09 MED ORDER — FREE WATER
30.0000 mL | Status: DC
  Administered 2021-06-09 – 2021-06-10 (×7): 30 mL

## 2021-06-09 MED ORDER — ALTEPLASE 2 MG IJ SOLR
2.0000 mg | Freq: Once | INTRAMUSCULAR | Status: AC | PRN
  Administered 2021-06-09: 2 mg

## 2021-06-09 MED ORDER — COLLAGENASE 250 UNIT/GM EX OINT
TOPICAL_OINTMENT | Freq: Every day | CUTANEOUS | Status: DC
  Filled 2021-06-09 (×2): qty 30

## 2021-06-09 MED ORDER — THIAMINE HCL 100 MG/ML IJ SOLN
500.0000 mg | INTRAVENOUS | Status: AC
  Administered 2021-06-09 – 2021-06-11 (×3): 500 mg via INTRAVENOUS
  Filled 2021-06-09 (×3): qty 5

## 2021-06-09 MED ORDER — SODIUM CHLORIDE 0.9% IV SOLUTION: Freq: Once | INTRAVENOUS | Status: DC

## 2021-06-09 MED ORDER — GUAIFENESIN-DM 100-10 MG/5ML PO SYRP
10.0000 mL | ORAL_SOLUTION | Freq: Four times a day (QID) | ORAL | Status: DC | PRN
  Administered 2021-06-09: 10 mL
  Filled 2021-06-09: qty 10

## 2021-06-09 MED ORDER — VITAL 1.5 CAL PO LIQD
1000.0000 mL | ORAL | Status: DC
  Administered 2021-06-09: 1000 mL

## 2021-06-09 MED ORDER — FENTANYL CITRATE PF 50 MCG/ML IJ SOSY
25.0000 ug | PREFILLED_SYRINGE | INTRAMUSCULAR | Status: DC | PRN
  Administered 2021-06-09: 25 ug via INTRAVENOUS
  Filled 2021-06-09: qty 1

## 2021-06-09 MED ORDER — FENTANYL CITRATE PF 50 MCG/ML IJ SOSY
50.0000 ug | PREFILLED_SYRINGE | INTRAMUSCULAR | Status: DC | PRN
  Administered 2021-06-09 – 2021-06-21 (×23): 50 ug via INTRAVENOUS
  Filled 2021-06-09 (×23): qty 1

## 2021-06-09 NOTE — Progress Notes (Signed)
CRRT stopped no/blood returned due to flow issues with the patient's access catheter. Will instill Cathflo to address the issue. Patient's return line is heparin locked for the time being. Patient is currently calm and resting in the bed. Vitals are WNL.   Cameron Ali, RN

## 2021-06-09 NOTE — Consult Note (Addendum)
[facility] Nurse Consult Note: Reason for Consult: Consult requested for buttocks/sacrum.  Pt is critically ill in ICU with multiple systemic factors which can impair healing.   Skin to back and bilat buttocks and sacrum is dry and peeling off in sheets from unknown etiology to some locations, including bilat buttocks and sacrum.  Lower buttock with Stage 2 pressure injury noted as present on admission in bedside nurses wound flow sheet; 4X4X.1cm, red and moist. Pt was noted to have surgery in 2019 for infected perirectal abscess, according to EMR.  The scar tissue location over the sacrum now has patchy areas of eschar; affected area is approx 7X7cm, 50% red and moist where skin has peeled and revealed partial thickness skin loss, and 50% full thickness wounds with black eschar, which is difficult to see related to the Pt's skin coloring. Pressure Injury POA: Yes Dressing procedure/placement/frequency: Pt is on a low airloss mattress to reduce pressure.  Topical treatment orders provided for bedside nurses to perform to assist with removal of nonviable tissue:  1. Apply Santyl to sacrum wound Q day, then cover with moist 4X4 and foam dressing.  (Change foam dressing Q 3 days or PRN soiling.) 2. Foam dressing to lower buttock, change Q 3 days or PRN soiling.) 3. Apply Eucerin cream to back dry peeling skin areas BID. Please re-consult if further assistance is needed.  Thank-you,  Julien Girt MSN, [place], [place], [place], [place]

## 2021-06-09 NOTE — Progress Notes (Signed)
NAME:  Jonathan Mcmillan, MRN:  [id number], DOB:  04/09/1996, LOS: 9 ADMISSION DATE:  05/31/2021, CONSULTATION DATE:  06/01/2021 REFERRING MD:  Dr. Leslye Peer, CHIEF COMPLAINT:  Cardiac Arrest   Brief Pt Description / Synopsis:  25 y.o. admitted with AKI on CKD in setting of viral syndrome and diarrhea.  Suffered in-hospital cardiac arrest (initial rhythm asystole), suspect due to severe metabolic acidosis and multiple metabolic derangements.  History of Present Illness:  Jonathan Mcmillan is a 26 year old male with a past medical history significant for diabetes mellitus type 1, chronic kidney disease stage IV, hypertension, nicotine dependence who presented to [facility] ED on 05/31/2021 due to 3-day history of fever, chills, nonproductive cough, myalgias, and diarrhea. Patient denied urinary frequency, nocturia, dysuria, abdominal pain, hematemesis, chest pain, shortness of breath, edema.  ED Course: Initial vital signs: Temperature 102.6, RR 22, HR 104, BP 139/70, SpO2 97% on room air Significant labs: Sodium 131, potassium 4.4, chloride 103, bicarb 15, glucose 177, BUN 19, creatinine 13.86 (baseline of 3.92), calcium 6.7, alkaline phosphatase 70, lipase 29, AST 24, ALT 33, lactic acid 2.1, WBC 12.7, hemoglobin 5.2 (baseline of 8.3) hematocrit 15.8, MCV 89.8, RDW 12.8, platelets 172, PT 15.8, INR 1.3. COVID-19 and influenza PCR negative Respiratory viral panel negative Urinalysis with proteinuria Imaging: Chest x-ray negative for acute cardiopulmonary disease CT Chest w/o contrast>>1. Trace pericardial effusion and mild diffuse body wall edema, new since 01/02/2020. 2. Clear lungs, with no focal consolidation or pleural effusion. 3. Hypodense blood pool consistent with anemia. Renal US>>Increased renal echogenicity compatible with medical renal disease. Retroperitoneal perinephric edema suspect related to volume overload.  Patient was admitted by the hospitalist for further work-up and treatment of  acute kidney injury on CKD.  Nephrology was consulted.  Interval History Early in the morning on 06/01/2021 rapid response was called when he was found in the shower on a chair passed out. Found to be  hypotensive, hypoglycemic, and lethargic with his "eyes rolling in the back of his head."  Patient was given 500 cc fluid bolus and ordered for blood transfusion.  However CODE BLUE was called as pt was found to be in asystole (progressed to PEA).  He received epinephrine x2, sodium bicarbonate x2, calcium x1, glucose and insulin.  Pulse was regained and he was transferred to ICU.  Upon arrival to the ICU patient patient is critically ill and concern for decorticate posturing.  PCCM consulted for further management.  Pertinent  Medical History  Chronic Kidney Disease Stage IV Diabetes Mellitus 1  Micro Data:  05/31/2021: SARS-CoV-2 and influenza PCR>> negative 05/31/2021: Respiratory viral panel>> negative 05/31/2021: Group A strep PCR>> not detected 05/31/2021: Blood culture x2>> no growth to date 05/31/2021: GI panel>> negative 05/31/2021: C. Difficile>> negative 06/01/2021: Tracheal aspirate>> normal respiratory flora 06/01/2021: Urine>>no growth 06/01/2021: Blood culture>>no growth 06/02/2021: MRSA PCR >>negative 06/04/2021: Tracheal aspirate>> E. Coli (resistant to ampicillin, Unasyn, Bactrim) 06/04/2021: Blood culture>> NGTD 06/04/2021: Blood Fungus>>  Antimicrobials:  Ceftriaxone 1/19 x 1 dose Vancomycin 1/19>>1/21 Zosyn 1/19>>1/22 Flagyl 1/22>>1/23 Unasyn 1/23>>1/25 Cefepime 1/23 x1 dose, restarted 1/25>>1/26 Cefazolin 1/26>>  Significant Hospital Events: Including procedures, antibiotic start and stop dates in addition to other pertinent events   1/18: Admitted by the hospitalist.  Nephrology consulted, plans for vascular to place temporary dialysis catheter with initiation of dialysis 1/19: In-hospital cardiac arrest.  Critically ill. Transferred to ICU.  Plan for CRRT.  Central line,  A line, and Trialysis catheter placed.  Concern for possible anoxic brain injury.  Sepsis workup in progress 1/20: Pt is awake, tracking, following simple commands. Remains on CRRT, bicarb gtt d/c.  Weaning fiO2 and pressors. Tracheal aspirate growing gram + cocci, gram + rods, gram - rods 1/21: Extubated; leukocytosis, thrombocytopenia 1/22: Reintubated overnight; apparent large volume aspiration; worsening shock 1/23 remains with multiorgan failure, plt transfusion 1/24 severe multiorgan failure 1/26:  Plan to place Trialysis in IJ and remove femoral, transfuse 1 unit plt prior to procedure. transfuse 1 unit prbc (hgb 6.7).  Tolerating PSV, hopeful for liberation from vent following line placement.  ABX changed to Cefazolin due to Cx/S. EXTUBATED 1/27: Remains extubated, weaned to room air.  Weaning vasopressors.  Ordered for 2 units pRBC's due to Hgb 5.6. Connective tissue workup ordered  Interim History / Subjective:  -Extubated yesterday evening, remains extubated, weaned to RA -Awake and alert, confused to situation -No significant events reported overnight -Afebrile, Neo weaned off, Levo weaned to 2 mcg, continues on Vasopressin -Hgb 5.6 this am (6.7), will give 2 unit pRBCs -Femoral trialysis and A-line removed yesterday; Left IJ trialysis placed yesterday -Remains on CRRT -Leukocytosis improved to 24.8 (31.7)  Objective   Blood pressure 123/78, pulse 86, temperature 97.7 F (36.5 C), temperature source Oral, resp. rate 17, height 5' 7.99" (1.727 m), weight 65.3 kg, SpO2 100 %.    Vent Mode: Spontaneous FiO2 (%):  [21 %-30 %] 21 % Set Rate:  [20 bmp] 20 bmp Vt Set:  [450 mL] 450 mL PEEP:  [5 cmH20] 5 cmH20 Pressure Support:  [5 cmH20] 5 cmH20   Intake/Output Summary (Last 24 hours) at 06/09/2021 0825 Last data filed at 06/09/2021 0800 Gross per 24 hour  Intake 2005.86 ml  Output 2244 ml  Net -238.14 ml    Filed Weights   06/08/21 0437 06/08/21 1400 06/09/21 0436  Weight:  67.4 kg 65.1 kg 65.3 kg    Examination: General: Acutely ill-appearing male, Awake, on RA, in NAD Lungs: Coarse breath sounds bilaterally, no wheezing,, even, nonlabored, no assessory muscle use Cardiovascular: regular rate and rhythm, no MRG Abdomen: Soft, nontender, nondistended, no guarding or rebound tenderness, +BS Extremities: 1+ pitting edema to shins bilaterally Neuro: Awake and alert, disoriented to situation, moving all extremities to command, nonf-ocal exam GU: Foley catheter in place with minimal urine output  Resolved Hospital Problem list     Assessment & Plan:   Acute hypoxic respiratory failure in setting of Cardiac Arrest, Aspiration Pneumonia & New HFrEF -EXTUBATED 1/26 -Supplemental O2 as needed to maintain O2 sats >92% -Follow intermittent Chest X-ray & ABG as needed -Prn Bronchodilators -Continue ABX as above -Volume removal with HD/CRRT -Ensure pulmonary hygiene -Aspiration precautions  Septic shock +/- Cardiogenic Shock New HFrEF: -Echocardiogram 06/01/21>>LVEF 35-40% and global hypokinesis, grade II diastolic dysfunction, mildly reduced RV function, and moderately increased Pulmonary systolic pressure In-Hospital-Cardiac Arrest due to severe metabolic acidosis and multiple metabolic derangements Mildly elevated troponin due to demand ischemia PMHx: Hypertension -Continuous cardiac monitoring -Maintain MAP >65 -Vasopressors as needed to maintain MAP goal -Transfusions as indicated -Continue Midodrine -Stress dose steroids -HS Troponin peaked at 386 -Cardiology following, appreciate input  Severe Sepsis due to Aspiration Pneumonia (E.coli) -Monitor fever curve -Trend WBC's & Procalcitonin -Follow cultures as above -Narrow to Cefazolin on 1/16 based on cultures & sensitivities  Anemia of Chronic Disease Thrombocytopenia (4T score is 3-4) in setting of Sepsis -Monitor for S/Sx of bleeding -Trend CBC -SCD's for VTE Prophylaxis  -Transfuse for Hgb  <7 ~ to receive 2 units pRBC's 1/27 -Transfuse  Platelets for platelet count <10K  -Hematology following, appreciate input -Workup negative for DIC or HIT  AKI on CKD Stage IV -Monitor I&O's / urinary output -Follow BMP -Ensure adequate renal perfusion -Avoid nephrotoxic agents as able -Replace electrolytes as indicated -Nephrology following, appreciate input ~ Continues on CRRT  PMHx: DM type I -CBG's q4h; Target range of 140 to 180 -SSI -Follow ICU Hypo/Hyperglycemia protocol   Pt is critically ill with multiorgan failure.  Prognosis is guarded, high risk for further cardiac arrest and death.   Best Practice (right click and "Reselect all SmartList Selections" daily)   Diet/type: NPO, tube feeds DVT prophylaxis: SCD (holding chemical prophylaxis due to thrombocytopenia) GI prophylaxis: PPI Lines: Central line and yes and it is still needed Foley:  Yes, and it is still needed Code Status:  full code Last date of multidisciplinary goals of care discussion [06/09/21]   Labs   CBC: Recent Labs  Lab 06/04/21 0936 06/04/21 1126 06/04/21 2308 06/05/21 0417 06/06/21 0423 06/07/21 0435 06/07/21 2004 06/08/21 0407 06/09/21 0411  WBC 24.1*   < > 29.3* 30.3* 32.5* 32.6* 34.2* 31.7* 24.8*  NEUTROABS 19.3*  --  21.9* 26.2*  --  27.6* 26.5*  --   --   HGB 8.9*   < > 8.7* 8.3* 7.9* 7.8* 7.3* 6.7* 5.6*  HCT 25.4*   < > 24.8* 23.8* 22.6* 22.7* 20.9* 18.5* 15.8*  MCV 84.9   < > 86.1 86.9 86.3 87.6 85.3 82.6 85.9  PLT 10*   < > 29* 15* 11* 9* 21* 17* 23*   < > = values in this interval not displayed.     Basic Metabolic Panel: Recent Labs  Lab 06/05/21 0410 06/05/21 1610 06/06/21 0420 06/06/21 1530 06/07/21 0435 06/07/21 1530 06/08/21 0407 06/08/21 1703 06/09/21 0411  NA 133*   < > 137   < > 136   135 138 137   136 137 136   134*  K 3.5   < > 3.7   < > 3.7   3.7 3.6 3.6   3.5 3.9 3.8   3.8  CL 104   < > 104   < > 105   104 104 106   107 105 105   104  CO2 22   < > 25    < > 24   23 24 22   22 23 25   25   GLUCOSE 182*   < > 147*   < > 196*   194* 93 158*   157* 122* 92   93  BUN 17   < > 19   < > 20   20 19  28*   27* 33* 32*   32*  CREATININE 1.42*   < > 1.24   < > 1.05   0.99 1.02 1.19   1.21 1.16 1.31*   1.24  CALCIUM 7.2*   < > 7.4*   < > 7.2*   7.2* 7.0* 6.7*   6.6* 6.8* 6.7*   6.6*  MG 2.2  --  2.4  --  2.5*  --  2.4  --  2.4  PHOS 1.7*   < > 1.6*   < > 1.9*   1.9* 3.0 2.6   2.6 2.7 2.0*   2.1*   < > = values in this interval not displayed.    GFR: Estimated Creatinine Clearance: 82.6 mL/min (by C-G formula based on SCr of 1.24 mg/dL). Recent Labs  Lab 06/02/21 1123 06/03/21 0307 06/03/21  0240 06/04/21 0016 06/04/21 0158 06/04/21 0210 06/04/21 0234 06/04/21 0936 06/04/21 1126 06/05/21 0410 06/05/21 0417 06/06/21 0420 06/06/21 0423 06/07/21 0435 06/07/21 2004 06/08/21 0407 06/09/21 0411  PROCALCITON  --  67.49  --   --  34.14  --   --   --   --  49.08  --  39.56  --   --   --   --   --   WBC  --  25.9*   < > 22.5*  --   --    < > 24.1*   < >  --    < >  --    < > 32.6* 34.2* 31.7* 24.8*  LATICACIDVEN 4.3*  --   --  2.8*  --  2.8*  --  3.3*  --   --   --   --   --   --   --   --   --    < > = values in this interval not displayed.     Liver Function Tests: Recent Labs  Lab 06/03/21 1416 06/04/21 0016 06/04/21 0508 06/07/21 0435 06/07/21 1530 06/08/21 0407 06/08/21 1703 06/09/21 0411  AST 21 18  --   --   --   --   --   --   ALT 42 39  --   --   --   --   --   --   ALKPHOS 91 89  --   --   --   --   --   --   BILITOT 2.2* 2.3*  --   --   --   --   --   --   PROT 4.4* 4.6*  --   --   --   --   --   --   ALBUMIN 1.7*   1.7* 1.8*   < > 1.7* 1.7* 1.7* 1.6* 1.7*   < > = values in this interval not displayed.    Recent Labs  Lab 06/08/21 0407  LIPASE 16    Recent Labs  Lab 06/04/21 0016  AMMONIA 23     ABG    Component Value Date/Time   PHART 7.35 06/06/2021 1038   PCO2ART 46 06/06/2021 1038   PO2ART 124 (H)  06/06/2021 1038   HCO3 25.4 06/06/2021 1038   TCO2 22 06/24/2007 0045   ACIDBASEDEF 0.3 06/06/2021 1038   O2SAT 98.6 06/06/2021 1038      Coagulation Profile: Recent Labs  Lab 06/04/21 0234  INR 1.3*     Cardiac Enzymes: No results for input(s): CKTOTAL, CKMB, CKMBINDEX, TROPONINI in the last 168 hours.   HbA1C: Hemoglobin-A1c  Date/Time Value Ref Range Status  06/26/2007 06:40 AM   Final   17.6% HIGH (NOTE) Reference Intervals:  Diabetic Adult        <9.0 Healthy Adult         3.9 - 7.3 Current ADA guidelines recommend a treatment goal of <7.0% HgbA1c for diabetic patients, which corresponds to a <9.0% Glycohemoglobin result with this method.   Performed at:  [facility]               [place], [place]  [address]   Hemoglobin A1C  Date/Time Value Ref Range Status  08/05/2013 01:26 PM 12.9 (H) 4.2 - 6.3 % Final    Comment:    The American Diabetes Association recommends that a  primary goal of therapy should be <7% and that physicians should reevaluate the treatment regimen in patients with HbA1c values consistently >8%.    Hgb A1c MFr Bld  Date/Time Value Ref Range Status  05/31/2021 12:05 PM 5.7 (H) 4.8 - 5.6 % Final    Comment:    RESULTS CONFIRMED BY MANUAL DILUTION REPEATED TO VERIFY (NOTE) Pre diabetes:          5.7%-6.4%  Diabetes:              >6.4%  Glycemic control for   <7.0% adults with diabetes   06/11/2020 03:41 AM 7.0 (H) 4.8 - 5.6 % Final    Comment:    (NOTE) Pre diabetes:          5.7%-6.4%  Diabetes:              >6.4%  Glycemic control for   <7.0% adults with diabetes     CBG: Recent Labs  Lab 06/08/21 1708 06/08/21 2019 06/09/21 0012 06/09/21 0416 06/09/21 0730  GLUCAP 115* 129* 126* 90 175*     Review of Systems:   Positives in BOLD: Gen: Denies fever, chills, weight change, fatigue, night sweats HEENT: Denies blurred vision, double vision, hearing loss, tinnitus, sinus congestion,  rhinorrhea, sore throat, neck stiffness, dysphagia PULM: Denies shortness of breath, cough, sputum production, hemoptysis, wheezing CV: Denies chest pain, edema, orthopnea, paroxysmal nocturnal dyspnea, palpitations GI: Denies abdominal pain, nausea, vomiting, diarrhea, hematochezia, melena, constipation, change in bowel habits GU: Denies dysuria, hematuria, polyuria, oliguria, urethral discharge Endocrine: Denies hot or cold intolerance, polyuria, polyphagia or appetite change Derm: Denies rash, dry skin, scaling or peeling skin change Heme: Denies easy bruising, bleeding, bleeding gums Neuro: Denies headache, numbness, weakness, slurred speech, loss of memory or consciousness    Past Medical History:  He,  has a past medical history of Cannabinoid hyperemesis syndrome, Diabetes 1.5, managed as type 1 ([place]), Gastroparesis, and Perirectal abscess (06/16/2017).   Surgical History:   Past Surgical History:  Procedure Laterality Date   INCISION AND DRAINAGE PERIRECTAL ABSCESS N/A 06/16/2017   Procedure: IRRIGATION AND DEBRIDEMENT PERIRECTAL ABSCESS;  Surgeon: Florene Glen, MD;  Location: [facility] ORS;  Service: General;  Laterality: N/A;   none       Social History:   reports that he has been smoking cigarettes. He has been smoking an average of .5 packs per day. He has never used smokeless tobacco. He reports current drug use. Drug: Marijuana. He reports that he does not drink alcohol.   Family History:  His family history includes Diabetes in his mother.   Allergies No Known Allergies   Home Medications  Prior to Admission medications   Medication Sig Start Date End Date Taking? Authorizing Provider  ondansetron (ZOFRAN) 4 MG tablet Take 1 tablet (4 mg total) by mouth every 8 (eight) hours as needed for up to 10 doses for nausea or vomiting. 05/31/21  Yes Lucrezia Starch, MD  amLODipine (NORVASC) 10 MG tablet Take 1 tablet (10 mg total) by mouth daily. Patient not taking: Reported  on 06/01/2021 06/14/20   Fritzi Mandes, MD  hydrALAZINE (APRESOLINE) 50 MG tablet Take 1 tablet (50 mg total) by mouth 2 (two) times daily. Patient not taking: Reported on 06/01/2021 06/13/20   Fritzi Mandes, MD  insulin glargine (LANTUS) 100 UNIT/ML injection Inject 20 Units into the skin at bedtime. Patient not taking: Reported on 06/01/2021 08/26/20   [provider]  omeprazole (PRILOSEC) 40 MG capsule Take  1 capsule (40 mg total) by mouth daily. 03/08/20 05/07/20  Arta Silence, MD  pantoprazole (PROTONIX) 40 MG tablet Take 1 tablet (40 mg total) by mouth daily. Patient not taking: Reported on 12/10/2018 09/10/18 01/02/20  Hillary Bow, MD  sucralfate (CARAFATE) 1 g tablet Take 1 tablet (1 g total) by mouth 4 (four) times daily. Patient not taking: Reported on 06/26/2019 02/04/19 01/02/20  Nance Pear, MD     Critical care time: 32 minutes     Bradly Bienenstock, MD 06/09/21 8:25 AM   [facility] Prefer epic messenger for cross cover needs If after hours, please call E-link

## 2021-06-09 NOTE — Consult Note (Signed)
[facility] for Electrolyte Monitoring and Replacement   Recent Labs: Potassium (mmol/L)  Date Value  06/09/2021 3.8  06/09/2021 3.8  08/05/2013 3.8   Magnesium (mg/dL)  Date Value  06/09/2021 2.4   Calcium (mg/dL)  Date Value  06/09/2021 6.6 (L)  06/09/2021 6.7 (L)   Calcium, Total (PTH) (mg/dL)  Date Value  06/12/2020 8.0   Albumin (g/dL)  Date Value  06/09/2021 1.7 (L)  06/09/2021 1.6 (L)  08/05/2013 2.9 (L)   Phosphorus (mg/dL)  Date Value  06/09/2021 2.1 (L)  06/09/2021 2.0 (L)   Sodium (mmol/L)  Date Value  06/09/2021 134 (L)  06/09/2021 136  08/05/2013 133 (L)   Assessment: KAHLE MCQUEEN is a 26 y.o. male with medical history including diabetes, CKD, HTN, nicotine dependence admitted on 05/31/2021 with  AKI on CKD, anemia, and fevers/chills/myalgias . Patient suffered cardiac arrest on the morning of 1/19 and was ultimately intubated and transferred to the ICU. Patient is currently on CRRT per nephrology. Pharmacy has been consulted to monitor and replace electrolytes.   Goal of Therapy:  Electrolytes within normal limits  Plan:  --Na phos 20 mmol IV x 1 --Continue to monitor renal function panels BID while on CRRT  Tawnya Crook, PharmD, BCPS Clinical Pharmacist 06/09/2021 11:41 AM

## 2021-06-09 NOTE — Progress Notes (Signed)
SUBJECTIVE: Jonathan Mcmillan is a 26 y.o. male with medical history including diabetes, CKD, HTN, nicotine dependence admitted on 05/31/2021 with AKI on CKD, anemia, and fevers/chills/myalgias . Patient suffered cardiac arrest on the morning of 1/19 and was ultimately intubated and transferred to the ICU.  Patient extubated 06/08/21.   Vitals:   06/09/21 0730 06/09/21 0750 06/09/21 0757 06/09/21 0800  BP: 122/73  123/78 123/78  Pulse: 81  84 86  Resp: 17  18 17   Temp: 97.7 F (36.5 C)  97.7 F (36.5 C)   TempSrc: Oral  Oral   SpO2: 100% 100% 100% 100%  Weight:      Height:        Intake/Output Summary (Last 24 hours) at 06/09/2021 0848 Last data filed at 06/09/2021 0800 Gross per 24 hour  Intake 2005.86 ml  Output 2244 ml  Net -238.14 ml    LABS: Basic Metabolic Panel: Recent Labs    06/08/21 0407 06/08/21 1703 06/09/21 0411  NA 137   136 137 136   134*  K 3.6   3.5 3.9 3.8   3.8  CL 106   107 105 105   104  CO2 22   22 23 25   25   GLUCOSE 158*   157* 122* 92   93  BUN 28*   27* 33* 32*   32*  CREATININE 1.19   1.21 1.16 1.31*   1.24  CALCIUM 6.7*   6.6* 6.8* 6.7*   6.6*  MG 2.4  --  2.4  PHOS 2.6   2.6 2.7 2.0*   2.1*   Liver Function Tests: Recent Labs    06/08/21 1703 06/09/21 0411  ALBUMIN 1.6* 1.7*   Recent Labs    06/08/21 0407  LIPASE 16   CBC: Recent Labs    06/07/21 0435 06/07/21 2004 06/08/21 0407 06/09/21 0411  WBC 32.6* 34.2* 31.7* 24.8*  NEUTROABS 27.6* 26.5*  --   --   HGB 7.8* 7.3* 6.7* 5.6*  HCT 22.7* 20.9* 18.5* 15.8*  MCV 87.6 85.3 82.6 85.9  PLT 9* 21* 17* 23*   Cardiac Enzymes: No results for input(s): CKTOTAL, CKMB, CKMBINDEX, TROPONINI in the last 72 hours. BNP: Invalid input(s): POCBNP D-Dimer: No results for input(s): DDIMER in the last 72 hours. Hemoglobin A1C: No results for input(s): HGBA1C in the last 72 hours. Fasting Lipid Panel: Recent Labs    06/08/21 0407  TRIG 110   Thyroid Function Tests: No results  for input(s): TSH, T4TOTAL, T3FREE, THYROIDAB in the last 72 hours.  Invalid input(s): FREET3 Anemia Panel: No results for input(s): VITAMINB12, FOLATE, FERRITIN, TIBC, IRON, RETICCTPCT in the last 72 hours.   PHYSICAL EXAM General: Well developed, well nourished, in no acute distress HEENT:  Normocephalic and atramatic Neck:  No JVD.  Lungs: Clear bilaterally to auscultation and percussion. Heart: HRRR . Normal S1 and S2 without gallops or murmurs.  Abdomen: Bowel sounds are positive, abdomen soft and non-tender  Msk:  Back normal, normal gait. Normal strength and tone for age. Extremities: No clubbing, cyanosis or edema.   Neuro: Alert and oriented X 3. Psych:  Good affect, responds appropriately  TELEMETRY: sinus rhythm, HR 85 bpm  ASSESSMENT AND PLAN: Patient cardiac arrest likely due to asystole secondary to metabolic disorder, Mildly elevated troponin levels due to demand ischemia.   Echo performed 06/01/21 revealed EF 35-40%. Mild LV dysfunction likely non-cardiac, age of patient most likely not CAD.    06/05/21 Patient went in to  atrial fibrillation with RVR. Amiodarone drip per protocol started. Patient maintaining sinus rhythm. Continue amiodarone for now. Patient alert this morning, extubated yesterday. Will continue to follow.  Principal Problem:   AKI (acute kidney injury) ([place]) Active Problems:   Hypertension   Acute kidney injury superimposed on CKD ([place])   Type 2 diabetes mellitus with hypoglycemia without coma, without long-term current use of insulin (H[facility])   Nicotine dependence   Symptomatic anemia   Hypotension   Fever   Cardiopulmonary arrest ([place])   Pressure injury of skin   Acute respiratory failure with hypoxia ([facility])   Thrombocytopenia ([place])    Jonathan Lienemann, FNP-C 06/09/2021 8:48 AM

## 2021-06-09 NOTE — Progress Notes (Signed)
[facility]  ROUNDING NOTE   Subjective:   Patient was extubated on 1/21 but had to be re-intubated for respiratory distress; suspected to have aspirated Extubated yesterday 06/08/21 Neuro-patient monitoring [place].  Able to follow simple commands. Pulm -extubated now.  Continued on nasal cannula oxygen Cvs: pressors -still requiring low-dose of Levophed and vasopressin.  Also on IV amiodarone Renal-  urine output remains low; CRRT continued  Objective:  Vital signs in last 24 hours:  Temp:  [95.4 F (35.2 C)-99.5 F (37.5 C)] 97.9 F (36.6 C) (01/27 1007) Pulse Rate:  [70-88] 84 (01/27 1100) Resp:  [12-31] 13 (01/27 1100) BP: (86-160)/(51-97) 138/86 (01/27 1100) SpO2:  [97 %-100 %] 100 % (01/27 1100) Arterial Line BP: (137)/(80) 137/80 (01/26 1230) FiO2 (%):  [21 %-30 %] 21 % (01/27 1100) Weight:  [65.1 kg-65.3 kg] 65.3 kg (01/27 0436)  Weight change: -2.3 kg Filed Weights   06/08/21 0437 06/08/21 1400 06/09/21 0436  Weight: 67.4 kg 65.1 kg 65.3 kg    Intake/Output: I/O last 3 completed shifts: In: 4142.9 [I.V.:2521.3; Blood:166.7; NG/GT:1155; IV Piggyback:299.9] Out: 3980.6 [Urine:5; Other:2460.6; QQPYP:9509]   Intake/Output this shift:  Total I/O In: 519.3 [I.V.:109.3; Blood:310; IV Piggyback:100] Out: 231 [Other:231]  Physical Exam: General: Critically ill  Eyes: Anicteric  Lungs:  Nasal cannula oxygen.  Coarse breath sounds.  Heart:  Tachycardic, irregular  Abdomen:  Soft,    Extremities: + peripheral edema.  Neurologic: Drowsy, looking around in the room.  Skin: No acute lesions  Access:  Left IJ temporary dialysis catheter  Rectal tube, Foley in place  Basic Metabolic Panel: Recent Labs  Lab 06/05/21 0410 06/05/21 1610 06/06/21 0420 06/06/21 1530 06/07/21 0435 06/07/21 1530 06/08/21 0407 06/08/21 1703 06/09/21 0411  NA 133*   < > 137   < > 136   135 138 137   136 137 136   134*  K 3.5   < > 3.7   < > 3.7   3.7 3.6 3.6   3.5 3.9  3.8   3.8  CL 104   < > 104   < > 105   104 104 106   107 105 105   104  CO2 22   < > 25   < > 24   23 24 22   22 23 25   25   GLUCOSE 182*   < > 147*   < > 196*   194* 93 158*   157* 122* 92   93  BUN 17   < > 19   < > 20   20 19  28*   27* 33* 32*   32*  CREATININE 1.42*   < > 1.24   < > 1.05   0.99 1.02 1.19   1.21 1.16 1.31*   1.24  CALCIUM 7.2*   < > 7.4*   < > 7.2*   7.2* 7.0* 6.7*   6.6* 6.8* 6.7*   6.6*  MG 2.2  --  2.4  --  2.5*  --  2.4  --  2.4  PHOS 1.7*   < > 1.6*   < > 1.9*   1.9* 3.0 2.6   2.6 2.7 2.0*   2.1*   < > = values in this interval not displayed.     Liver Function Tests: Recent Labs  Lab 06/03/21 1416 06/04/21 0016 06/04/21 0508 06/07/21 0435 06/07/21 1530 06/08/21 0407 06/08/21 1703 06/09/21 0411  AST 21 18  --   --   --   --   --  25  ALT 42 39  --   --   --   --   --  29  ALKPHOS 91 89  --   --   --   --   --  194*  BILITOT 2.2* 2.3*  --   --   --   --   --  2.7*  PROT 4.4* 4.6*  --   --   --   --   --  4.8*  ALBUMIN 1.7*   1.7* 1.8*   < > 1.7* 1.7* 1.7* 1.6* 1.6*   1.7*   < > = values in this interval not displayed.    Recent Labs  Lab 06/08/21 0407  LIPASE 16    Recent Labs  Lab 06/04/21 0016  AMMONIA 23     CBC: Recent Labs  Lab 06/04/21 0936 06/04/21 1126 06/04/21 2308 06/05/21 0417 06/06/21 0423 06/07/21 0435 06/07/21 2004 06/08/21 0407 06/09/21 0411  WBC 24.1*   < > 29.3* 30.3* 32.5* 32.6* 34.2* 31.7* 24.8*  NEUTROABS 19.3*  --  21.9* 26.2*  --  27.6* 26.5*  --   --   HGB 8.9*   < > 8.7* 8.3* 7.9* 7.8* 7.3* 6.7* 5.6*  HCT 25.4*   < > 24.8* 23.8* 22.6* 22.7* 20.9* 18.5* 15.8*  MCV 84.9   < > 86.1 86.9 86.3 87.6 85.3 82.6 85.9  PLT 10*   < > 29* 15* 11* 9* 21* 17* 23*   < > = values in this interval not displayed.     Cardiac Enzymes: No results for input(s): CKTOTAL, CKMB, CKMBINDEX, TROPONINI in the last 168 hours.   BNP: Invalid input(s): POCBNP  CBG: Recent Labs  Lab 06/08/21 2019 06/09/21 0012  06/09/21 0416 06/09/21 0730 06/09/21 1135  GLUCAP 129* 126* 90 175* 123*     Microbiology: Results for orders placed or performed during the hospital encounter of 05/31/21  Resp Panel by RT-PCR (Flu A&B, Covid) Nasopharyngeal Swab     Status: None   Collection Time: 05/31/21  6:58 AM   Specimen: Nasopharyngeal Swab; Nasopharyngeal(NP) swabs in vial transport medium  Result Value Ref Range Status   SARS Coronavirus 2 by RT PCR NEGATIVE NEGATIVE Final    Comment: (NOTE) SARS-CoV-2 target nucleic acids are NOT DETECTED.  The SARS-CoV-2 RNA is generally detectable in upper respiratory specimens during the acute phase of infection. The lowest concentration of SARS-CoV-2 viral copies this assay can detect is 138 copies/mL. A negative result does not preclude SARS-Cov-2 infection and should not be used as the sole basis for treatment or other patient management decisions. A negative result may occur with  improper specimen collection/handling, submission of specimen other than nasopharyngeal swab, presence of viral mutation(s) within the areas targeted by this assay, and inadequate number of viral copies(<138 copies/mL). A negative result must be combined with clinical observations, patient history, and epidemiological information. The expected result is Negative.  Fact Sheet for Patients:  EntrepreneurPulse.com.au  Fact Sheet for Healthcare Providers:  IncredibleEmployment.be  This test is no t yet approved or cleared by the [place] FDA and  has been authorized for detection and/or diagnosis of SARS-CoV-2 by FDA under an Emergency Use Authorization (EUA). This EUA will remain  in effect (meaning this test can be used) for the duration of the COVID-19 declaration under Section 564(b)(1) of the Act, 21 U.S.C.section 360bbb-3(b)(1), unless the authorization is terminated  or revoked sooner.       Influenza A by PCR NEGATIVE NEGATIVE  Final    Influenza B by PCR NEGATIVE NEGATIVE Final    Comment: (NOTE) The Xpert Xpress SARS-CoV-2/FLU/RSV plus assay is intended as an aid in the diagnosis of influenza from Nasopharyngeal swab specimens and should not be used as a sole basis for treatment. Nasal washings and aspirates are unacceptable for Xpert Xpress SARS-CoV-2/FLU/RSV testing.  Fact Sheet for Patients: EntrepreneurPulse.com.au  Fact Sheet for Healthcare Providers: IncredibleEmployment.be  This test is not yet approved or cleared by the [place] FDA and has been authorized for detection and/or diagnosis of SARS-CoV-2 by FDA under an Emergency Use Authorization (EUA). This EUA will remain in effect (meaning this test can be used) for the duration of the COVID-19 declaration under Section 564(b)(1) of the Act, 21 U.S.C. section 360bbb-3(b)(1), unless the authorization is terminated or revoked.  Performed at [facility], [place], [place] [address]   Respiratory (~20 pathogens) panel by PCR     Status: None   Collection Time: 05/31/21  6:58 AM   Specimen: Nasopharyngeal Swab; Respiratory  Result Value Ref Range Status   Adenovirus NOT DETECTED NOT DETECTED Final   Coronavirus 229E NOT DETECTED NOT DETECTED Final    Comment: (NOTE) The Coronavirus on the Respiratory Panel, DOES NOT test for the novel  Coronavirus (2019 nCoV)    Coronavirus HKU1 NOT DETECTED NOT DETECTED Final   Coronavirus NL63 NOT DETECTED NOT DETECTED Final   Coronavirus OC43 NOT DETECTED NOT DETECTED Final   Metapneumovirus NOT DETECTED NOT DETECTED Final   Rhinovirus / Enterovirus NOT DETECTED NOT DETECTED Final   Influenza A NOT DETECTED NOT DETECTED Final   Influenza B NOT DETECTED NOT DETECTED Final   Parainfluenza Virus 1 NOT DETECTED NOT DETECTED Final   Parainfluenza Virus 2 NOT DETECTED NOT DETECTED Final   Parainfluenza Virus 3 NOT DETECTED NOT DETECTED Final    Parainfluenza Virus 4 NOT DETECTED NOT DETECTED Final   Respiratory Syncytial Virus NOT DETECTED NOT DETECTED Final   Bordetella pertussis NOT DETECTED NOT DETECTED Final   Bordetella Parapertussis NOT DETECTED NOT DETECTED Final   Chlamydophila pneumoniae NOT DETECTED NOT DETECTED Final   Mycoplasma pneumoniae NOT DETECTED NOT DETECTED Final    Comment: Performed at [facility], [place]. [address] [place], [place] [address]  Group A Strep by PCR [facility] Only)     Status: None   Collection Time: 05/31/21  8:35 AM   Specimen: Throat; Sterile Swab  Result Value Ref Range Status   Group A Strep by PCR NOT DETECTED NOT DETECTED Final    Comment: Performed at [facility], [address] [place], [place] [address]  Blood culture (routine x 2)     Status: None   Collection Time: 05/31/21  8:39 AM   Specimen: BLOOD  Result Value Ref Range Status   Specimen Description BLOOD BLOOD RIGHT FOREARM  Final   Special Requests   Final    BOTTLES DRAWN AEROBIC AND ANAEROBIC Blood Culture results may not be optimal due to an excessive volume of blood received in culture bottles   Culture   Final    NO GROWTH 5 DAYS Performed at [facility], [address] [place], [place] [address]    Report Status 06/05/2021 FINAL  Final  Blood culture (routine x 2)     Status: None   Collection Time: 05/31/21  8:39 AM   Specimen: BLOOD  Result Value Ref Range Status   Specimen Description BLOOD RIGHT ANTECUBITAL  Final  Special Requests   Final    BOTTLES DRAWN AEROBIC AND ANAEROBIC Blood Culture results may not be optimal due to an excessive volume of blood received in culture bottles   Culture   Final    NO GROWTH 5 DAYS Performed at [facility], [address] [place], [place] [address]    Report Status 06/05/2021 FINAL  Final  C Difficile Quick Screen w PCR reflex     Status: None   Collection Time: 05/31/21  1:20 PM   Specimen: Stool  Result Value Ref Range Status    C Diff antigen NEGATIVE NEGATIVE Final   C Diff toxin NEGATIVE NEGATIVE Final   C Diff interpretation No C. difficile detected.  Final    Comment: Performed at [facility], [address] [place], [place] [address]  Gastrointestinal Panel by PCR , Stool     Status: None   Collection Time: 05/31/21  1:30 PM   Specimen: Stool  Result Value Ref Range Status   Campylobacter species NOT DETECTED NOT DETECTED Final   Plesimonas shigelloides NOT DETECTED NOT DETECTED Final   Salmonella species NOT DETECTED NOT DETECTED Final   Yersinia enterocolitica NOT DETECTED NOT DETECTED Final   Vibrio species NOT DETECTED NOT DETECTED Final   Vibrio cholerae NOT DETECTED NOT DETECTED Final   Enteroaggregative E coli (EAEC) NOT DETECTED NOT DETECTED Final   Enteropathogenic E coli (EPEC) NOT DETECTED NOT DETECTED Final   Enterotoxigenic E coli (ETEC) NOT DETECTED NOT DETECTED Final   Shiga like toxin producing E coli (STEC) NOT DETECTED NOT DETECTED Final   Shigella/Enteroinvasive E coli (EIEC) NOT DETECTED NOT DETECTED Final   Cryptosporidium NOT DETECTED NOT DETECTED Final   Cyclospora cayetanensis NOT DETECTED NOT DETECTED Final   Entamoeba histolytica NOT DETECTED NOT DETECTED Final   Giardia lamblia NOT DETECTED NOT DETECTED Final   Adenovirus F40/41 NOT DETECTED NOT DETECTED Final   Astrovirus NOT DETECTED NOT DETECTED Final   Norovirus GI/GII NOT DETECTED NOT DETECTED Final   Rotavirus A NOT DETECTED NOT DETECTED Final   Sapovirus (I, II, IV, and V) NOT DETECTED NOT DETECTED Final    Comment: Performed at [facility], [address], [place], [place] [address]  Urine Culture     Status: None   Collection Time: 06/01/21 10:23 AM   Specimen: In/Out Cath Urine  Result Value Ref Range Status   Specimen Description   Final    IN/OUT CATH URINE Performed at [facility], [address] [place],  [place] [address]    Special Requests   Final    NONE Performed  at [facility], [address], [place], [place] [address]    Culture   Final    NO GROWTH Performed at [facility], [address] [place], [place] [address]    Report Status 06/03/2021 FINAL  Final  Culture, Respiratory w Gram Stain     Status: None   Collection Time: 06/01/21  3:59 PM   Specimen: Tracheal Aspirate; Respiratory  Result Value Ref Range Status   Specimen Description   Final    TRACHEAL ASPIRATE Performed at [facility], [address] [place], [place] [address]    Special Requests   Final    NONE Performed at [facility], [place], [place] [address]    Gram Stain   Final    RARE SQUAMOUS EPITHELIAL CELLS PRESENT MODERATE WBC PRESENT,BOTH PMN AND MONONUCLEAR MODERATE GRAM POSITIVE  COCCI FEW GRAM NEGATIVE RODS FEW GRAM POSITIVE RODS RARE YEAST    Culture   Final    FEW Consistent with normal respiratory flora. No Pseudomonas species isolated Performed at [facility] [address] [place], [place] [address]    Report Status 06/03/2021 FINAL  Final  CULTURE, BLOOD (ROUTINE X 2) w Reflex to ID Panel     Status: None   Collection Time: 06/01/21  7:06 PM   Specimen: BLOOD  Result Value Ref Range Status   Specimen Description BLOOD RIGHT ANTECUBITAL  Final   Special Requests   Final    BOTTLES DRAWN AEROBIC ONLY Blood Culture results may not be optimal due to an inadequate volume of blood received in culture bottles   Culture   Final    NO GROWTH 5 DAYS Performed at [facility], [address] [place], [place] [address]    Report Status 06/06/2021 FINAL  Final  CULTURE, BLOOD (ROUTINE X 2) w Reflex to ID Panel     Status: None   Collection Time: 06/01/21  7:37 PM   Specimen: BLOOD  Result Value Ref Range Status   Specimen Description BLOOD [facility]  Final   Special Requests BOTTLES DRAWN AEROBIC AND ANAEROBIC BCLV  Final   Culture   Final    NO GROWTH 5 DAYS Performed at [facility], [address], [place], [place] [address]    Report Status 06/06/2021 FINAL  Final  MRSA Next Gen by PCR, Nasal     Status: None   Collection Time: 06/02/21 10:46 AM   Specimen: Nasal Mucosa; Nasal Swab  Result Value Ref Range Status   MRSA by PCR Next Gen NOT DETECTED NOT DETECTED Final    Comment: (NOTE) The GeneXpert MRSA Assay (FDA approved for NASAL specimens only), is one component of a comprehensive MRSA colonization surveillance program. It is not intended to diagnose MRSA infection nor to guide or monitor treatment for MRSA infections. Test performance is not FDA approved in patients less than 30 years old. Performed at [facility], [address] [place], [place] [address]   Culture, Respiratory w Gram Stain     Status: None   Collection Time: 06/04/21  7:38 AM   Specimen: Tracheal Aspirate; Respiratory  Result Value Ref Range Status   Specimen Description   Final    TRACHEAL ASPIRATE Performed at [facility], [address] [place], [place] [address]    Special Requests   Final    NONE Performed at [facility], [address] [place], [place] [address]    Gram Stain   Final    MODERATE WBC PRESENT,BOTH PMN AND MONONUCLEAR NO ORGANISMS SEEN Performed at [facility], [place] [address], [place],  [address]    Culture FEW CANDIDA ALBICANS RARE ESCHERICHIA COLI   Final   Report Status 06/07/2021 FINAL  Final   Organism ID, Bacteria ESCHERICHIA COLI  Final      Susceptibility   Escherichia coli - MIC*    AMPICILLIN >=32 RESISTANT Resistant     CEFAZOLIN <=4 SENSITIVE Sensitive     CEFEPIME <=0.12 SENSITIVE Sensitive     CEFTAZIDIME <=1 SENSITIVE Sensitive     CEFTRIAXONE <=0.25 SENSITIVE Sensitive     CIPROFLOXACIN <=0.25 SENSITIVE Sensitive     GENTAMICIN <=1 SENSITIVE Sensitive     IMIPENEM <=0.25 SENSITIVE Sensitive     TRIMETH/SULFA >=320 RESISTANT Resistant     AMPICILLIN/SULBACTAM >=32  RESISTANT  Resistant     PIP/TAZO <=4 SENSITIVE Sensitive     * RARE ESCHERICHIA COLI  CULTURE, BLOOD (ROUTINE X 2) w Reflex to ID Panel     Status: None   Collection Time: 06/04/21  6:35 PM   Specimen: BLOOD  Result Value Ref Range Status   Specimen Description BLOOD BLOOD LEFT HAND  Final   Special Requests   Final    BOTTLES DRAWN AEROBIC AND ANAEROBIC Blood Culture adequate volume   Culture   Final    NO GROWTH 5 DAYS Performed at [facility], [address], [place], [place] [address]    Report Status 06/09/2021 FINAL  Final    Coagulation Studies: No results for input(s): LABPROT, INR in the last 72 hours.   Urinalysis: No results for input(s): COLORURINE, LABSPEC, PHURINE, GLUCOSEU, HGBUR, BILIRUBINUR, KETONESUR, PROTEINUR, UROBILINOGEN, NITRITE, LEUKOCYTESUR in the last 72 hours.  Invalid input(s): APPERANCEUR     Imaging: DG Abd 1 View  Result Date: 06/09/2021 CLINICAL DATA:  NG tube placement. EXAM: ABDOMEN - 1 VIEW COMPARISON:  KUB, most recently 06/09/2021. CT chest and pelvis, 06/03/2018. FINDINGS: Support lines: Enteric feeding tube, with tip and side port within stomach. Imaged bowel gas is nonobstructed. No radio-opaque calculi or other significant radiographic abnormality are seen. IMPRESSION: 1. Gastric placement of enteric feeding tube 2. Nonobstructed gas pattern of the imaged bowel. Electronically Signed   By: Michaelle Birks M.D.   On: 06/09/2021 10:27   DG Abd 1 View  Result Date: 06/09/2021 CLINICAL DATA:  Nasogastric tube placement EXAM: ABDOMEN - 1 VIEW COMPARISON:  06/04/2021 FINDINGS: Enteric tube descends below the diaphragm with the side port and tip overlying the stomach. This appears to be a different tube than on the prior 06/04/2021 radiograph. Nonobstructive bowel-gas pattern. No portal venous gas or pneumatosis. The lung bases are clear. No acute skeletal abnormality. IMPRESSION: Enteric tube in appropriate position. Electronically Signed    By: Yvonne Kendall M.D.   On: 06/09/2021 10:25   DG Chest Port 1 View  Result Date: 06/08/2021 CLINICAL DATA:  Support devices EXAM: PORTABLE CHEST 1 VIEW COMPARISON:  Chest x-ray dated June 05, 2021 FINDINGS: Unchanged position of ET tube. Interval placement of esophageal temperature probe with tip just below the thoracic inlet. New left IJ line with tip positioned over the expected area of the upper right atrium. Cardiac and mediastinal contours are unchanged. Interval resolution of bibasilar opacities. No large pleural effusion or evidence of pneumothorax. IMPRESSION: 1. New left IJ line with tip positioned over the expected area of the upper right atrium. 2. Interval placement of enteric tube with tip overlying the stomach and side port near the GE junction, recommend advancement for optimal positioning. 3. Interval resolution of bibasilar opacities. Electronically Signed   By: Yetta Glassman M.D.   On: 06/08/2021 13:37     Medications:     prismasol BGK 4/2.5 300 mL/hr at 06/09/21 0738    prismasol BGK 4/2.5 300 mL/hr at 06/09/21 0738   amiodarone 30 mg/hr (06/09/21 1100)    ceFAZolin (ANCEF) IV Stopped (06/09/21 0954)   dexmedetomidine (PRECEDEX) IV infusion Stopped (06/08/21 1349)   feeding supplement (VITAL 1.5 CAL)     norepinephrine (LEVOPHED) Adult infusion 2 mcg/min (06/09/21 1100)   phenylephrine (NEO-SYNEPHRINE) Adult infusion Stopped (06/08/21 1111)   prismasol BGK 2/2.5 dialysis solution Stopped (06/03/21 1756)   prismasol BGK 2/2.5 replacement solution Stopped (06/03/21 1756)   prismasol BGK 2/2.5 replacement solution Stopped (06/03/21 1756)  prismasol BGK 4/2.5 1,200 mL/hr at 06/09/21 6283   sodium phosphate  Dextrose 5% IVPB     thiamine injection 500 mg (06/09/21 1142)   vasopressin 0.03 Units/min (06/09/21 1100)    sodium chloride   Intravenous Once   albuterol  2.5 mg Nebulization Q6H   chlorhexidine gluconate (MEDLINE KIT)  15 mL Mouth Rinse BID    Chlorhexidine Gluconate Cloth  6 each Topical Daily   collagenase   Topical Daily   docusate  100 mg Per Tube BID   [START ON 06/10/2021] feeding supplement (PROSource TF)  45 mL Per Tube Daily   free water  30 mL Per Tube Q4H   hydrocerin   Topical BID   hydrocortisone sod succinate (SOLU-CORTEF) inj  50 mg Intravenous Q6H   insulin aspart  3-9 Units Subcutaneous Q4H   lidocaine  1 patch Transdermal Q24H   mouth rinse  15 mL Mouth Rinse BID   metoCLOPramide (REGLAN) injection  5 mg Intravenous Q8H   midodrine  10 mg Per Tube TID WC   multivitamin  1 tablet Per Tube QHS   nutrition supplement (JUVEN)  1 packet Per Tube BID BM   pantoprazole (PROTONIX) IV  40 mg Intravenous Q24H   polyethylene glycol  17 g Per Tube Daily   sodium chloride flush  10-40 mL Intracatheter Q12H   [START ON 06/12/2021] thiamine injection  100 mg Intravenous Q24H   fentaNYL, fentaNYL (SUBLIMAZE) injection, guaiFENesin-dextromethorphan, heparin, ipratropium-albuterol, lip balm, ondansetron **OR** ondansetron (ZOFRAN) IV, oxyCODONE, polyvinyl alcohol, sodium chloride, sodium chloride flush  Assessment/ Plan:  Jonathan Mcmillan is a 26 y.o. black male with hypertension, insulin dependent diabetes mellitus type I, diabetic gastroparesis, diabetic neuropathy, tobacco use, THC use, right toe amputation, perirectal abscess, who is admitted to [facility] on 05/31/2021 for SIRS (systemic inflammatory response syndrome) ([place]) [R65.10] AKI (acute kidney injury) ([place]) [N17.9] Symptomatic anemia [D64.9] Acute renal failure superimposed on stage 5 chronic kidney disease, not on chronic dialysis, unspecified acute renal failure type ([place]) [N17.9, N18.5] Hypertension, unspecified type [I10] Acute cough [R05.1]  Cardiac arrest with code blue 06/01/2021 at 10 am. Transferred to ICU. Intubated  Acute kidney injury on chronic kidney disease stage IV versus progression of chronic kidney disease to end stage renal disease. Baseline  creatinine of 4.13, GFR of 19 on 08/26/20. History of nephrotic range proteinuria and hematuria. No history of renal biopsy. Strong family history of dialysis with mother with ESRD before passing.  - remains hemodynamically unstable.  - Continue CVVHDF for another 1 to 2 days.  Will consider switching to intermittent hemodialysis once requirement for pressors is minimal. Pre filter and post filter replacement 300 ml/hr Dialysate 1200 cc/min Net Fluid removal rate 0 cc/hr Now has a left IJ dialysis catheter    Acute respiratory failure Vent assisted, suspected to have aspirated Extubated then re-intubated 06/03/2021; extubated 06/08/2021  Hypotension with cardiogenic shock - requiring vasopressors.   4. Insulin dependent Diabetes type 1 with CKD and proteinuria   Lab Results  Component Value Date   HGBA1C 5.7 (H) 05/31/2021    5. Thrombocytopenia Hematology evaluation on 1/22 Suspected peripheral destruction Work up in progress Got platelet infusion on January 25.    LOS: [place] 1/27/202311:56 AM

## 2021-06-09 NOTE — Progress Notes (Addendum)
Nutrition Follow Up Note   DOCUMENTATION CODES:   Not applicable  INTERVENTION:   Vital 1.5@65ml /hr- Initiate at 46m/hr and increase by 166mhr q 8 hours until goal rate is reached.   Pro-Source 4525maily via tube, provides 40kcal and 11g of protein per serving   Free water flushes 103m25m hours to maintain tube patency   Regimen provides 2380kcal/day, 116g/day protein and 1372ml42m of free water.   Rena- vit daily via tube   Juven Fruit Punch BID via tube, each serving provides 95kcal and 2.5g of protein (amino acids glutamine and arginine)  NUTRITION DIAGNOSIS:   Inadequate oral intake related to inability to eat (pt sedated and ventilated) as evidenced by NPO status.  GOAL:   Patient will meet greater than or equal to 90% of their needs -met with tube feeds  MONITOR:   Labs, Weight trends, TF tolerance, Skin, I & O's, Diet advancement  ASSESSMENT:   25 y.14 male with medical history significant for type 1 diabetes mellitus, stage IV chronic kidney disease, hypertension and nicotine dependence who presents to the emergency room for evaluation of a 3-day history of fever, chills, nonproductive cough, myalgias and diarrhea. Pt found to have SIRS, AKI and anemia. Pt noted to have new Afib with RVR.   Pt extubated 1/25. NGT placed yesterday. Per RN report, pt vomited once overnight; pt with severe gastroparesis and vomits intermittently at home per family report. Pt is receiving reglan. Will restart trickle feeds today and advance as tolerated. Pt continues to have diarrhea; will hold off on adding fiber r/t gastroparesis. Per chart, pt is down ~22lbs since admit but appears to be back to his UBW. Pt -215ml 1mis I & Os. Pt remains on CRRT.   Medications reviewed and include: colace, solu-cortef, insulin, reglan, ren-vit, juven, protonix, miralax, thiamine, unasyn, cefazolin, levophed, vasopressin   Labs reviewed: Na 134(L), K 3.8 wnl, BUN 32(H), creat 1.31(H), P 2.0(L), Mg  2.4 wnl Wbc- 24.8(H), Hgb 5.6(L), Hct 15.8(L) Cbgs- 175, 90, 126, 129, 115, 117 x 24 hrs  Diet Order:   Diet Order             Diet NPO time specified  Diet effective now                  EDUCATION NEEDS:   No education needs have been identified at this time  Skin:  Skin Assessment: Reviewed RN Assessment (Lower buttock with Stage 2 pressure injury 4X4X.1cm, scar tissue location over the sacrum 7X7cm, wounds bilateral heels)  Last BM:  1/27- type 7  Height:   Ht Readings from Last 1 Encounters:  06/03/21 5' 7.99" (1.727 m)    Weight:   Wt Readings from Last 1 Encounters:  06/09/21 65.3 kg    Ideal Body Weight:  70 kg  BMI:  Body mass index is 21.89 kg/m.  Estimated Nutritional Needs:   Kcal:  2000-2300kcal/day  Protein:  100-115g/day  Fluid:  2.0-2.3L/day  Milarose Savich Koleen DistanceD, LDN Please refer to [facility]ION [facility] and/or RD on-call/weekend/after hours pager

## 2021-06-10 ENCOUNTER — Encounter: Payer: Self-pay | Admitting: Internal Medicine

## 2021-06-10 LAB — RENAL FUNCTION PANEL
Albumin: 1.7 g/dL — ABNORMAL LOW (ref 3.5–5.0)
Anion gap: 7 (ref 5–15)
BUN: 36 mg/dL — ABNORMAL HIGH (ref 6–20)
CO2: 24 mmol/L (ref 22–32)
Calcium: 6.7 mg/dL — ABNORMAL LOW (ref 8.9–10.3)
Chloride: 103 mmol/L (ref 98–111)
Creatinine, Ser: 1.75 mg/dL — ABNORMAL HIGH (ref 0.61–1.24)
GFR, Estimated: 54 mL/min — ABNORMAL LOW (ref >60)
Glucose, Bld: 161 mg/dL — ABNORMAL HIGH (ref 70–99)
Phosphorus: 3 mg/dL (ref 2.5–4.6)
Potassium: 3.8 mmol/L (ref 3.5–5.1)
Sodium: 134 mmol/L — ABNORMAL LOW (ref 135–145)

## 2021-06-10 LAB — CBC
HCT: 21.7 % — ABNORMAL LOW (ref 39.0–52.0)
Hemoglobin: 7.7 g/dL — ABNORMAL LOW (ref 13.0–17.0)
MCH: 29.8 pg (ref 26.0–34.0)
MCHC: 35.5 g/dL (ref 30.0–36.0)
MCV: 84.1 fL (ref 80.0–100.0)
Platelets: 33 10*3/uL — ABNORMAL LOW (ref 150–400)
RBC: 2.58 MIL/uL — ABNORMAL LOW (ref 4.22–5.81)
RDW: 14.6 % (ref 11.5–15.5)
WBC: 22.7 10*3/uL — ABNORMAL HIGH (ref 4.0–10.5)
nRBC: 0.1 % (ref 0.0–0.2)

## 2021-06-10 LAB — BPAM RBC
Blood Product Expiration Date: 202302222359
Blood Product Expiration Date: 202302222359
ISSUE DATE / TIME: 202301270736
ISSUE DATE / TIME: 202301270947
Unit Type and Rh: 7300
Unit Type and Rh: 7300

## 2021-06-10 LAB — TYPE AND SCREEN
ABO/RH(D): B POS
Antibody Screen: NEGATIVE
Unit division: 0
Unit division: 0

## 2021-06-10 LAB — GLUCOSE, CAPILLARY
Glucose-Capillary: 199 mg/dL — ABNORMAL HIGH (ref 70–99)
Glucose-Capillary: 222 mg/dL — ABNORMAL HIGH (ref 70–99)
Glucose-Capillary: 361 mg/dL — ABNORMAL HIGH (ref 70–99)
Glucose-Capillary: 224 mg/dL — ABNORMAL HIGH (ref 70–99)
Glucose-Capillary: 386 mg/dL — ABNORMAL HIGH (ref 70–99)
Glucose-Capillary: 286 mg/dL — ABNORMAL HIGH (ref 70–99)

## 2021-06-10 LAB — ANA W/REFLEX IF POSITIVE

## 2021-06-10 LAB — MAGNESIUM: Magnesium: 2.5 mg/dL — ABNORMAL HIGH (ref 1.7–2.4)

## 2021-06-10 LAB — RHEUMATOID FACTOR: Rheumatoid fact SerPl-aCnc: <10 IU/mL (ref <14.0)

## 2021-06-10 MED ORDER — ALTEPLASE 2 MG IJ SOLR
2.0000 mg | Freq: Once | INTRAMUSCULAR | Status: AC
  Administered 2021-06-10: 2 mg
  Filled 2021-06-10: qty 2

## 2021-06-10 MED ORDER — AMIODARONE HCL 200 MG PO TABS
200.0000 mg | ORAL_TABLET | Freq: Two times a day (BID) | ORAL | Status: DC
  Administered 2021-06-11 – 2021-06-14 (×8): 200 mg via ORAL
  Filled 2021-06-10 (×8): qty 1

## 2021-06-10 MED ORDER — INSULIN ASPART 100 UNIT/ML IJ SOLN
3.0000 [IU] | INTRAMUSCULAR | Status: DC
  Filled 2021-06-10: qty 1

## 2021-06-10 MED ORDER — RENA-VITE PO TABS
1.0000 | ORAL_TABLET | Freq: Every day | ORAL | Status: DC
  Administered 2021-06-11 – 2021-06-21 (×11): 1 via ORAL
  Filled 2021-06-10 (×11): qty 1

## 2021-06-10 MED ORDER — DOCUSATE SODIUM 50 MG/5ML PO LIQD
100.0000 mg | Freq: Two times a day (BID) | ORAL | Status: DC | PRN
  Filled 2021-06-10: qty 10

## 2021-06-10 MED ORDER — ALTEPLASE 2 MG IJ SOLR
2.0000 mg | Freq: Once | INTRAMUSCULAR | Status: AC
  Administered 2021-06-10: 2 mg

## 2021-06-10 MED ORDER — STERILE WATER FOR INJECTION IJ SOLN
INTRAMUSCULAR | Status: AC
  Administered 2021-06-10: 10 mL
  Filled 2021-06-10: qty 10

## 2021-06-10 MED ORDER — OXYCODONE HCL 5 MG PO TABS
5.0000 mg | ORAL_TABLET | ORAL | Status: DC | PRN
  Administered 2021-06-11 – 2021-06-22 (×31): 5 mg via ORAL
  Filled 2021-06-10 (×31): qty 1

## 2021-06-10 MED ORDER — INSULIN ASPART 100 UNIT/ML IJ SOLN
3.0000 [IU] | INTRAMUSCULAR | Status: DC
  Administered 2021-06-10 (×2): 9 [IU] via SUBCUTANEOUS
  Administered 2021-06-11: 3 [IU] via SUBCUTANEOUS
  Administered 2021-06-11: 6 [IU] via SUBCUTANEOUS
  Administered 2021-06-11 (×2): 9 [IU] via SUBCUTANEOUS
  Administered 2021-06-12: 6 [IU] via SUBCUTANEOUS
  Administered 2021-06-12 (×4): 3 [IU] via SUBCUTANEOUS
  Filled 2021-06-10 (×8): qty 1

## 2021-06-10 MED ORDER — MIDODRINE HCL 5 MG PO TABS
10.0000 mg | ORAL_TABLET | Freq: Three times a day (TID) | ORAL | Status: DC
  Administered 2021-06-11 (×2): 10 mg via ORAL
  Filled 2021-06-10 (×3): qty 2

## 2021-06-10 MED ORDER — GUAIFENESIN-DM 100-10 MG/5ML PO SYRP: 10.0000 mL | ORAL_SOLUTION | Freq: Four times a day (QID) | ORAL | Status: DC | PRN

## 2021-06-10 NOTE — Evaluation (Signed)
Clinical/Bedside Swallow Evaluation Patient Details  Name: Jonathan Mcmillan MRN: [id number] Date of Birth: 12/17/1995  Today's Date: 06/10/2021 Time: SLP Start Time (ACUTE ONLY): 65 SLP Stop Time (ACUTE ONLY): 1507 SLP Time Calculation (min) (ACUTE ONLY): 57 min  Past Medical History:  Past Medical History:  Diagnosis Date   Cannabinoid hyperemesis syndrome    Diabetes 1.5, managed as type 1 ([place])    Gastroparesis    Perirectal abscess 06/16/2017   Past Surgical History:  Past Surgical History:  Procedure Laterality Date   INCISION AND DRAINAGE PERIRECTAL ABSCESS N/A 06/16/2017   Procedure: IRRIGATION AND DEBRIDEMENT PERIRECTAL ABSCESS;  Surgeon: Florene Glen, MD;  Location: [facility] ORS;  Service: General;  Laterality: N/A;   none     HPI:  Per admitting H&P " [name] is a 26 y.o. male with medical history significant for diabetes mellitus with complications of stage IV chronic kidney disease, hypertension, nicotine dependence who presents to the emergency room for evaluation of a 3-day history of fever, chills, nonproductive cough, myalgias and diarrhea.  He denies having any sick contacts.  He denies having any urinary frequency, nocturia or dysuria and denies having any abdominal pain.  He denies having a headache, no sore throat, no chest pain, no shortness of breath, no back pain, no lower extremity swelling, no focal deficits of blurred vision.  He denies having any hematemesis, no melena stools or hematochezia and denies NSAID use.  Upon arrival to the ER he was noted to have a T-max of 102.6, he was tachycardic and tachypneic.   Sodium 131, potassium 4.4, chloride 102, bicarb 15, glucose 177, BUN 19, creatinine 13.86 when compared to baseline of 3.92, calcium 6.7, alkaline phosphatase 70, albumin 2.9, lipase 29, AST 24, ALT 33, total protein 6.3, lactic acid 2.1, white count 12.7, hemoglobin 5.2 compared to baseline of 8.3, hematocrit 15.8, MCV 89.8, RDW 12.8, platelet count  172, PT 15.8, INR 1.3  Respiratory viral panel is negative  Urine analysis shows proteinuria  Chest x-ray reviewed by me shows no evidence of acute cardiopulmonary disease  Twelve-lead EKG reviewed by me shows sinus tachycardia with nonspecific T wave changes in the lateral leads."    Assessment / Plan / Recommendation  Clinical Impression  Pt presents with moderate oral dysphagia. NO overt s/s of aspiration with any tested consistency today. Oral mech exam revealed dry red mucosa and poor dentition with several teeth missing especially on the top. Pt tolerated single sips of thin water without difficulty. Given bites of applesauce from the spoon, Pt would only open for very small bites. Significant oral transit delay with solids which Pt eventually cleared. Pt has NGT with feedings at this itme. Rec Dys 1 diet with thin liquids and strict aspiration precautions. PO feedings to start only once NGT is removed. Meds crushed ion applesauce. ST to follow up with toleration of diet. ST to provideTrials of solid consistencies once overall condition improves. Prognosis good for toleration of diet. SLP Visit Diagnosis: Dysphagia, oropharyngeal phase (R13.12)    Aspiration Risk  Mild aspiration risk;Moderate aspiration risk    Diet Recommendation Dysphagia 1 (Puree);Thin liquid   Liquid Administration via: Straw Medication Administration: Crushed with puree Supervision: Full supervision/cueing for compensatory strategies Compensations: Slow rate;Small sips/bites;Follow solids with liquid Postural Changes: Seated upright at 90 degrees;Remain upright for at least 30 minutes after po intake    Other  Recommendations Oral Care Recommendations: Oral care before and after PO    Recommendations for follow  up therapy are one component of a multi-disciplinary discharge planning process, led by the attending physician.  Recommendations may be updated based on patient status, additional functional criteria and  insurance authorization.  Follow up Recommendations Skilled nursing-short term rehab (<3 hours/day)      Assistance Recommended at Discharge Intermittent Supervision/Assistance  Functional Status Assessment Patient has had a recent decline in their functional status and demonstrates the ability to make significant improvements in function in a reasonable and predictable amount of time.  Frequency and Duration min 3x week  2 weeks       Prognosis Prognosis for Safe Diet Advancement: Fair Barriers to Reach Goals: Other (Comment) (overall condition)      Swallow Study   General Date of Onset: 05/31/21 HPI: Per admitting H&P " Jonathan Mcmillan is a 26 y.o. male with medical history significant for diabetes mellitus with complications of stage IV chronic kidney disease, hypertension, nicotine dependence who presents to the emergency room for evaluation of a 3-day history of fever, chills, nonproductive cough, myalgias and diarrhea.  He denies having any sick contacts.  He denies having any urinary frequency, nocturia or dysuria and denies having any abdominal pain.  He denies having a headache, no sore throat, no chest pain, no shortness of breath, no back pain, no lower extremity swelling, no focal deficits of blurred vision.  He denies having any hematemesis, no melena stools or hematochezia and denies NSAID use.  Upon arrival to the ER he was noted to have a T-max of 102.6, he was tachycardic and tachypneic.   Sodium 131, potassium 4.4, chloride 102, bicarb 15, glucose 177, BUN 19, creatinine 13.86 when compared to baseline of 3.92, calcium 6.7, alkaline phosphatase 70, albumin 2.9, lipase 29, AST 24, ALT 33, total protein 6.3, lactic acid 2.1, white count 12.7, hemoglobin 5.2 compared to baseline of 8.3, hematocrit 15.8, MCV 89.8, RDW 12.8, platelet count 172, PT 15.8, INR 1.3  Respiratory viral panel is negative  Urine analysis shows proteinuria  Chest x-ray reviewed by me shows no evidence of  acute cardiopulmonary disease  Twelve-lead EKG reviewed by me shows sinus tachycardia with nonspecific T wave changes in the lateral leads." Type of Study: Bedside Swallow Evaluation Diet Prior to this Study: NPO Respiratory Status: Room air History of Recent Intubation: Yes Length of Intubations (days): 5 days Date extubated: 06/08/21 Behavior/Cognition: Alert;Cooperative;Pleasant mood;Other (Comment) (flat affect) Oral Cavity Assessment: Dry Oral Care Completed by SLP: Yes Oral Cavity - Dentition: Poor condition;Missing dentition Vision: Impaired for self-feeding Self-Feeding Abilities: Needs assist Patient Positioning: Upright in bed Baseline Vocal Quality: Low vocal intensity Volitional Cough: Weak Volitional Swallow: Able to elicit    Oral/Motor/Sensory Function Overall Oral Motor/Sensory Function: Mild impairment Lingual ROM: Within Functional Limits Lingual Symmetry: Within Functional Limits Lingual Strength: Reduced Lingual Sensation: Within Functional Limits Mandible: Within Functional Limits   Ice Chips Ice chips: Within functional limits Presentation: Spoon   Thin Liquid Thin Liquid: Within functional limits Presentation: Spoon;Straw    Nectar Thick Nectar Thick Liquid: Not tested   Honey Thick Honey Thick Liquid: Not tested   Puree Puree: Impaired Presentation: Spoon Oral Phase Impairments: Impaired mastication;Reduced lingual movement/coordination Oral Phase Functional Implications: Other (comment) (took very small bites)   Solid     Solid: Impaired Presentation: Self Fed Oral Phase Impairments: Reduced lingual movement/coordination;Poor awareness of bolus;Impaired mastication Oral Phase Functional Implications: Prolonged oral transit;Oral residue      Lucila Maine 06/10/2021,3:08 PM

## 2021-06-10 NOTE — Progress Notes (Signed)
SUBJECTIVE: Patient apparently is responding to questioning appropriately and appears to be more alert today.   Vitals:   06/10/21 1500 06/10/21 1600 06/10/21 1630 06/10/21 1700  BP: 113/75 119/76 121/82 116/79  Pulse: 75 71 69 68  Resp: 12 16 15 11   Temp:      TempSrc:      SpO2: 97%     Weight:      Height:        Intake/Output Summary (Last 24 hours) at 06/10/2021 1756 Last data filed at 06/10/2021 1700 Gross per 24 hour  Intake 1180.57 ml  Output 1039 ml  Net 141.57 ml    LABS: Basic Metabolic Panel: Recent Labs    06/09/21 0411 06/09/21 1600 06/10/21 0500  NA 136   134* 139 134*  K 3.8   3.8 4.1 3.8  CL 105   104 103 103  CO2 25   25 24 24   GLUCOSE 92   93 167* 161*  BUN 32*   32* 31* 36*  CREATININE 1.31*   1.24 1.34* 1.75*  CALCIUM 6.7*   6.6* 7.3* 6.7*  MG 2.4  --  2.5*  PHOS 2.0*   2.1* 2.5 3.0   Liver Function Tests: Recent Labs    06/09/21 0411 06/09/21 1600 06/10/21 0500  AST 25  --   --   ALT 29  --   --   ALKPHOS 194*  --   --   BILITOT 2.7*  --   --   PROT 4.8*  --   --   ALBUMIN 1.6*   1.7* 1.8* 1.7*   Recent Labs    06/08/21 0407  LIPASE 16   CBC: Recent Labs    06/07/21 2004 06/08/21 0407 06/09/21 0411 06/09/21 1338 06/10/21 0500  WBC 34.2*   < > 24.8*  --  22.7*  NEUTROABS 26.5*  --   --   --   --   HGB 7.3*   < > 5.6* 8.3* 7.7*  HCT 20.9*   < > 15.8* 23.2* 21.7*  MCV 85.3   < > 85.9  --  84.1  PLT 21*   < > 23*  --  33*   < > = values in this interval not displayed.   Cardiac Enzymes: No results for input(s): CKTOTAL, CKMB, CKMBINDEX, TROPONINI in the last 72 hours. BNP: Invalid input(s): POCBNP D-Dimer: No results for input(s): DDIMER in the last 72 hours. Hemoglobin A1C: No results for input(s): HGBA1C in the last 72 hours. Fasting Lipid Panel: Recent Labs    06/08/21 0407  TRIG 110   Thyroid Function Tests: No results for input(s): TSH, T4TOTAL, T3FREE, THYROIDAB in the last 72 hours.  Invalid input(s):  FREET3 Anemia Panel: Recent Labs    06/09/21 0411  RETICCTPCT 0.7     PHYSICAL EXAM General: Well developed, well nourished, in no acute distress HEENT:  Normocephalic and atramatic Neck:  No JVD.  Lungs: Clear bilaterally to auscultation and percussion. Heart: HRRR . Normal S1 and S2 without gallops or murmurs.  Abdomen: Bowel sounds are positive, abdomen soft and non-tender  Msk:  Back normal, normal gait. Normal strength and tone for age. Extremities: No clubbing, cyanosis or edema.   Neuro: Alert and oriented X 3. Psych:  Good affect, responds appropriately  TELEMETRY: Normal sinus rhythm  ASSESSMENT AND PLAN: Status post cardiac arrest due to asystole status post respiratory failure currently extubated and much more alert.  Status post atrial fibrillation with rapid ventricular response  rate on IV amiodarone converted to normal sinus rhythm.  Advise switching IV amiodarone to p.o. amiodarone 400 p.o. twice daily.  Principal Problem:   AKI (acute kidney injury) ([place]) Active Problems:   Hypertension   Acute kidney injury superimposed on CKD ([place])   Type 2 diabetes mellitus with hypoglycemia without coma, without long-term current use of insulin ([facility])   Nicotine dependence   Symptomatic anemia   Hypotension   Fever   Cardiopulmonary arrest ([facility])   Pressure injury of skin   Acute respiratory failure with hypoxia ([facility])   Thrombocytopenia ([facility])    Jonathan Mcmillan A, MD, [facility] 06/10/2021 5:56 PM

## 2021-06-10 NOTE — Progress Notes (Signed)
Patient's access HD lumen still clogged. Instilled second dose of Cathflo per instruction. Reached out to Dr. Theador Hawthorne regarding this issue at 2230 and called with an update at 0000. Would like for the CRRT to be restarted and approved of Cathflo use. Britton-Lee, NP, also aware of access issues. If this second dose does not resolve the issue, the line may need to be replaced.  Cameron Ali, RN

## 2021-06-10 NOTE — Procedures (Signed)
Central Venous Catheter Insertion Procedure Note  Jonathan Mcmillan  [id number]  1995-07-31  Date:06/10/21  Time:5:47 AM   Provider Performing:Lanell Carpenter L [place]   Procedure: Insertion of Non-tunneled Central Venous Catheter(36556)with US guidance (28003)    Indication(s) Hemodialysis  Consent Risks of the procedure as well as the alternatives and risks of each were explained to the patient and/or caregiver.  Consent for the procedure was obtained and is signed in the bedside chart  Anesthesia Topical only with 1% lidocaine   Timeout Verified patient identification, verified procedure, site/side was marked, verified correct patient position, special equipment/implants available, medications/allergies/relevant history reviewed, required imaging and test results available.  Sterile Technique Maximal sterile technique including full sterile barrier drape, hand hygiene, sterile gown, sterile gloves, mask, hair covering, sterile ultrasound probe cover (if used).  Procedure Description Area of catheter insertion was cleaned with chlorhexidine and draped in sterile fashion.   With real-time ultrasound guidance a HD catheter was placed into the right femoral vein.  Nonpulsatile blood flow and easy flushing noted in all ports.  The catheter was sutured in place and sterile dressing applied.  Complications/Tolerance None; patient tolerated the procedure well. Chest X-ray is ordered to verify placement for internal jugular or subclavian cannulation.  Chest x-ray is not ordered for femoral cannulation.  EBL Minimal  Specimen(s) None  Jonathan Mcmillan, AGACNP-BC Acute Care Nurse Practitioner [facility]   [phone] / [phone] Please see Amion for pager details.

## 2021-06-10 NOTE — Progress Notes (Signed)
[facility]  ROUNDING NOTE   Subjective:   Patient was extubated on 1/21 but had to be re-intubated for respiratory distress;Pt was  suspected to have aspirated Extubated on 06/08/21 Able to follow simple commands. Patient was seen today in the ICU Patient follows commands Patient does not offer any new physical complaints Patient is now off vasopressors     Objective:  Vital signs in last 24 hours:  Temp:  [97.6 F (36.4 C)-99.3 F (37.4 C)] 98.4 F (36.9 C) (01/28 1134) Pulse Rate:  [69-89] 69 (01/28 1630) Resp:  [7-27] 15 (01/28 1630) BP: (107-132)/(60-86) 121/82 (01/28 1630) SpO2:  [91 %-100 %] 97 % (01/28 1500) FiO2 (%):  [21 %] 21 % (01/27 1900) Weight:  [61.6 kg] 61.6 kg (01/28 0315)  Weight change: -3.5 kg Filed Weights   06/08/21 1400 06/09/21 0436 06/10/21 0315  Weight: 65.1 kg 65.3 kg 61.6 kg    Intake/Output: I/O last 3 completed shifts: In: 2328.2 [I.V.:782.5; Blood:650; Other:50; NG/GT:238.7; IV Piggyback:607.1] Out: 2562 [Urine:5; BTDHR:4163; Stool:470]   Intake/Output this shift:  Total I/O In: 228 [I.V.:48; NG/GT:80; IV Piggyback:100] Out: 184 [Urine:4; Other:180]  Physical Exam: General: Critically ill  Eyes: Anicteric  Lungs:  Nasal cannula oxygen.  Coarse breath sounds at bases  Heart:  Tachycardic, irregular  Abdomen:  Soft,    Extremities: + peripheral edema.  Neurologic: More alert than yesterday Following commands  Skin: No acute lesions  Access:  Rght femoral temporary dialysis catheter  Rectal tube, Foley in place  Basic Metabolic Panel: Recent Labs  Lab 06/06/21 0420 06/06/21 1530 06/07/21 0435 06/07/21 1530 06/08/21 0407 06/08/21 1703 06/09/21 0411 06/09/21 1600 06/10/21 0500  NA 137   < > 136   135   < > 137   136 137 136   134* 139 134*  K 3.7   < > 3.7   3.7   < > 3.6   3.5 3.9 3.8   3.8 4.1 3.8  CL 104   < > 105   104   < > 106   107 105 105   104 103 103  CO2 25   < > 24   23   < > _0 GLUCOSE 147*   < > 196*   194*   < > 158*   157* 122* 92   93 167* 161*  BUN 19   < > 20   20   < > 28*   27* 33* 32*   32* 31* 36*  CREATININE 1.24   < > 1.05   0.99   < > 1.19   1.21 1.16 1.31*   1.24 1.34* 1.75*  CALCIUM 7.4*   < > 7.2*   7.2*   < > 6.7*   6.6* 6.8* 6.7*   6.6* 7.3* 6.7*  MG 2.4  --  2.5*  --  2.4  --  2.4  --  2.5*  PHOS 1.6*   < > 1.9*   1.9*   < > 2.6   2.6 2.7 2.0*   2.1* 2.5 3.0   < > = values in this interval not displayed.    Liver Function Tests: Recent Labs  Lab 06/04/21 0016 06/04/21 0508 06/08/21 0407 06/08/21 1703 06/09/21 0411 06/09/21 1600 06/10/21 0500  AST 18  --   --   --  25  --   --   ALT 39  --   --   --  29  --   --   ALKPHOS 89  --   --   --  194*  --   --   BILITOT 2.3*  --   --   --  2.7*  --   --   PROT 4.6*  --   --   --  4.8*  --   --   ALBUMIN 1.8*   < > 1.7* 1.6* 1.6*   1.7* 1.8* 1.7*   < > = values in this interval not displayed.   Recent Labs  Lab 06/08/21 0407  LIPASE 16   Recent Labs  Lab 06/04/21 0016  AMMONIA 23    CBC: Recent Labs  Lab 06/04/21 0936 06/04/21 1126 06/04/21 2308 06/05/21 0417 06/06/21 0423 06/07/21 0435 06/07/21 2004 06/08/21 0407 06/09/21 0411 06/09/21 1338 06/10/21 0500  WBC 24.1*   < > 29.3* 30.3*   < > 32.6* 34.2* 31.7* 24.8*  --  22.7*  NEUTROABS 19.3*  --  21.9* 26.2*  --  27.6* 26.5*  --   --   --   --   HGB 8.9*   < > 8.7* 8.3*   < > 7.8* 7.3* 6.7* 5.6* 8.3* 7.7*  HCT 25.4*   < > 24.8* 23.8*   < > 22.7* 20.9* 18.5* 15.8* 23.2* 21.7*  MCV 84.9   < > 86.1 86.9   < > 87.6 85.3 82.6 85.9  --  84.1  PLT 10*   < > 29* 15*   < > 9* 21* 17* 23*  --  33*   < > = values in this interval not displayed.    Cardiac Enzymes: No results for input(s): CKTOTAL, CKMB, CKMBINDEX, TROPONINI in the last 168 hours.   BNP: Invalid input(s): POCBNP  CBG: Recent Labs  Lab 06/09/21 2309 06/10/21 0309 06/10/21 0746 06/10/21 0748 06/10/21 1200  GLUCAP 175* 199* 361* 222* 224*     Microbiology: Results for orders placed or performed during the hospital encounter of 05/31/21  Resp Panel by RT-PCR (Flu A&B, Covid) Nasopharyngeal Swab     Status: None   Collection Time: 05/31/21  6:58 AM   Specimen: Nasopharyngeal Swab; Nasopharyngeal(NP) swabs in vial transport medium  Result Value Ref Range Status   SARS Coronavirus 2 by RT PCR NEGATIVE NEGATIVE Final    Comment: (NOTE) SARS-CoV-2 target nucleic acids are NOT DETECTED.  The SARS-CoV-2 RNA is generally detectable in upper respiratory specimens during the acute phase of infection. The lowest concentration of SARS-CoV-2 viral copies this assay can detect is 138 copies/mL. A negative result does not preclude SARS-Cov-2 infection and should not be used as the sole basis for treatment or other patient management decisions. A negative result may occur with  improper specimen collection/handling, submission of specimen other than nasopharyngeal swab, presence of viral mutation(s) within the areas targeted by this assay, and inadequate number of viral copies(<138 copies/mL). A negative result must be combined with clinical observations, patient history, and epidemiological information. The expected result is Negative.  Fact Sheet for Patients:  EntrepreneurPulse.com.au  Fact Sheet for Healthcare Providers:  IncredibleEmployment.be  This test is no t yet approved or cleared by the [place] FDA and  has been authorized for detection and/or diagnosis of SARS-CoV-2 by FDA under an Emergency Use Authorization (EUA). This EUA will remain  in effect (meaning this test can be used) for the duration of the COVID-19 declaration under Section 564(b)(1) of the Act, 21 U.S.C.section 360bbb-3(b)(1), unless the authorization is terminated  or revoked sooner.       Influenza A by PCR NEGATIVE NEGATIVE Final   Influenza B by PCR NEGATIVE NEGATIVE Final    Comment: (NOTE) The Xpert  Xpress SARS-CoV-2/FLU/RSV plus assay is intended as an aid in the diagnosis of influenza from Nasopharyngeal swab specimens and should not be used as a sole basis for treatment. Nasal washings and aspirates are unacceptable for Xpert Xpress SARS-CoV-2/FLU/RSV testing.  Fact Sheet for Patients: EntrepreneurPulse.com.au  Fact Sheet for Healthcare Providers: IncredibleEmployment.be  This test is not yet approved or cleared by the [place] FDA and has been authorized for detection and/or diagnosis of SARS-CoV-2 by FDA under an Emergency Use Authorization (EUA). This EUA will remain in effect (meaning this test can be used) for the duration of the COVID-19 declaration under Section 564(b)(1) of the Act, 21 U.S.C. section 360bbb-3(b)(1), unless the authorization is terminated or revoked.  Performed at [facility], [place], [place] [address]   Respiratory (~20 pathogens) panel by PCR     Status: None   Collection Time: 05/31/21  6:58 AM   Specimen: Nasopharyngeal Swab; Respiratory  Result Value Ref Range Status   Adenovirus NOT DETECTED NOT DETECTED Final   Coronavirus 229E NOT DETECTED NOT DETECTED Final    Comment: (NOTE) The Coronavirus on the Respiratory Panel, DOES NOT test for the novel  Coronavirus (2019 nCoV)    Coronavirus HKU1 NOT DETECTED NOT DETECTED Final   Coronavirus NL63 NOT DETECTED NOT DETECTED Final   Coronavirus OC43 NOT DETECTED NOT DETECTED Final   Metapneumovirus NOT DETECTED NOT DETECTED Final   Rhinovirus / Enterovirus NOT DETECTED NOT DETECTED Final   Influenza A NOT DETECTED NOT DETECTED Final   Influenza B NOT DETECTED NOT DETECTED Final   Parainfluenza Virus 1 NOT DETECTED NOT DETECTED Final   Parainfluenza Virus 2 NOT DETECTED NOT DETECTED Final   Parainfluenza Virus 3 NOT DETECTED NOT DETECTED Final   Parainfluenza Virus 4 NOT DETECTED NOT DETECTED Final   Respiratory Syncytial Virus  NOT DETECTED NOT DETECTED Final   Bordetella pertussis NOT DETECTED NOT DETECTED Final   Bordetella Parapertussis NOT DETECTED NOT DETECTED Final   Chlamydophila pneumoniae NOT DETECTED NOT DETECTED Final   Mycoplasma pneumoniae NOT DETECTED NOT DETECTED Final    Comment: Performed at [facility], [place]. [address] [place], [place] [address]  Group A Strep by PCR [facility] Only)     Status: None   Collection Time: 05/31/21  8:35 AM   Specimen: Throat; Sterile Swab  Result Value Ref Range Status   Group A Strep by PCR NOT DETECTED NOT DETECTED Final    Comment: Performed at [facility], [address] [place], [place] [address]  Blood culture (routine x 2)     Status: None   Collection Time: 05/31/21  8:39 AM   Specimen: BLOOD  Result Value Ref Range Status   Specimen Description BLOOD BLOOD RIGHT FOREARM  Final   Special Requests   Final    BOTTLES DRAWN AEROBIC AND ANAEROBIC Blood Culture results may not be optimal due to an excessive volume of blood received in culture bottles   Culture   Final    NO GROWTH 5 DAYS Performed at [facility], [address] [place], [place] [address]    Report Status 06/05/2021 FINAL  Final  Blood culture (routine x 2)     Status: None   Collection Time: 05/31/21  8:39 AM   Specimen: BLOOD  Result Value Ref Range Status   Specimen Description BLOOD RIGHT ANTECUBITAL  Final   Special Requests   Final    BOTTLES DRAWN AEROBIC AND ANAEROBIC Blood Culture results may not be optimal due to an excessive volume of blood received in culture bottles   Culture   Final    NO GROWTH 5 DAYS Performed at [facility], [address] [place], [place] [address]    Report Status 06/05/2021 FINAL  Final  C Difficile Quick Screen w PCR reflex     Status: None   Collection Time: 05/31/21  1:20 PM   Specimen: Stool  Result Value Ref Range Status   C Diff antigen NEGATIVE NEGATIVE Final   C Diff toxin NEGATIVE NEGATIVE Final   C  Diff interpretation No C. difficile detected.  Final    Comment: Performed at [facility], [address] [place], [place] [address]  Gastrointestinal Panel by PCR , Stool     Status: None   Collection Time: 05/31/21  1:30 PM   Specimen: Stool  Result Value Ref Range Status   Campylobacter species NOT DETECTED NOT DETECTED Final   Plesimonas shigelloides NOT DETECTED NOT DETECTED Final   Salmonella species NOT DETECTED NOT DETECTED Final   Yersinia enterocolitica NOT DETECTED NOT DETECTED Final   Vibrio species NOT DETECTED NOT DETECTED Final   Vibrio cholerae NOT DETECTED NOT DETECTED Final   Enteroaggregative E coli (EAEC) NOT DETECTED NOT DETECTED Final   Enteropathogenic E coli (EPEC) NOT DETECTED NOT DETECTED Final   Enterotoxigenic E coli (ETEC) NOT DETECTED NOT DETECTED Final   Shiga like toxin producing E coli (STEC) NOT DETECTED NOT DETECTED Final   Shigella/Enteroinvasive E coli (EIEC) NOT DETECTED NOT DETECTED Final   Cryptosporidium NOT DETECTED NOT DETECTED Final   Cyclospora cayetanensis NOT DETECTED NOT DETECTED Final   Entamoeba histolytica NOT DETECTED NOT DETECTED Final   Giardia lamblia NOT DETECTED NOT DETECTED Final   Adenovirus F40/41 NOT DETECTED NOT DETECTED Final   Astrovirus NOT DETECTED NOT DETECTED Final   Norovirus GI/GII NOT DETECTED NOT DETECTED Final   Rotavirus A NOT DETECTED NOT DETECTED Final   Sapovirus (I, II, IV, and V) NOT DETECTED NOT DETECTED Final    Comment: Performed at [facility], [address] [place], [place] [address]  Urine Culture     Status: None   Collection Time: 06/01/21 10:23 AM   Specimen: In/Out Cath Urine  Result Value Ref Range Status   Specimen Description   Final    IN/OUT CATH URINE Performed at [facility], [address], [place], [place] [address]    Special Requests   Final    NONE Performed at [facility], [address], [place], [place] [address]    Culture    Final    NO GROWTH Performed at [facility], [address], [place], [place] [address]    Report Status 06/03/2021 FINAL  Final  Culture, Respiratory w Gram Stain     Status: None   Collection Time: 06/01/21  3:59 PM   Specimen: Tracheal Aspirate; Respiratory  Result Value Ref Range Status   Specimen Description   Final    TRACHEAL ASPIRATE Performed at [facility], [address] [place], [place] [address]    Special Requests   Final    NONE Performed at [facility], [address] [place], [place] [address]    Gram Stain   Final  RARE SQUAMOUS EPITHELIAL CELLS PRESENT MODERATE WBC PRESENT,BOTH PMN AND MONONUCLEAR MODERATE GRAM POSITIVE COCCI FEW GRAM NEGATIVE RODS FEW GRAM POSITIVE RODS RARE YEAST    Culture   Final    FEW Consistent with normal respiratory flora. No Pseudomonas species isolated Performed at [facility] [address], [place], [place] [address]    Report Status 06/03/2021 FINAL  Final  CULTURE, BLOOD (ROUTINE X 2) w Reflex to ID Panel     Status: None   Collection Time: 06/01/21  7:06 PM   Specimen: BLOOD  Result Value Ref Range Status   Specimen Description BLOOD RIGHT ANTECUBITAL  Final   Special Requests   Final    BOTTLES DRAWN AEROBIC ONLY Blood Culture results may not be optimal due to an inadequate volume of blood received in culture bottles   Culture   Final    NO GROWTH 5 DAYS Performed at [facility], [address] [place], [place] [address]    Report Status 06/06/2021 FINAL  Final  CULTURE, BLOOD (ROUTINE X 2) w Reflex to ID Panel     Status: None   Collection Time: 06/01/21  7:37 PM   Specimen: BLOOD  Result Value Ref Range Status   Specimen Description BLOOD [facility]  Final   Special Requests BOTTLES DRAWN AEROBIC AND ANAEROBIC BCLV  Final   Culture   Final    NO GROWTH 5 DAYS Performed at [facility], [address], [place], [place] [address]    Report Status 06/06/2021  FINAL  Final  MRSA Next Gen by PCR, Nasal     Status: None   Collection Time: 06/02/21 10:46 AM   Specimen: Nasal Mucosa; Nasal Swab  Result Value Ref Range Status   MRSA by PCR Next Gen NOT DETECTED NOT DETECTED Final    Comment: (NOTE) The GeneXpert MRSA Assay (FDA approved for NASAL specimens only), is one component of a comprehensive MRSA colonization surveillance program. It is not intended to diagnose MRSA infection nor to guide or monitor treatment for MRSA infections. Test performance is not FDA approved in patients less than 59 years old. Performed at [facility], [address] [place], [place] [address]   Culture, Respiratory w Gram Stain     Status: None   Collection Time: 06/04/21  7:38 AM   Specimen: Tracheal Aspirate; Respiratory  Result Value Ref Range Status   Specimen Description   Final    TRACHEAL ASPIRATE Performed at [facility], [address], [place], [place] [address]    Special Requests   Final    NONE Performed at [facility], [address] [place], [place] [address]    Gram Stain   Final    MODERATE WBC PRESENT,BOTH PMN AND MONONUCLEAR NO ORGANISMS SEEN Performed at [facility], [place] [address], [place], [place] [address]    Culture FEW CANDIDA ALBICANS RARE ESCHERICHIA COLI   Final   Report Status 06/07/2021 FINAL  Final   Organism ID, Bacteria ESCHERICHIA COLI  Final      Susceptibility   Escherichia coli - MIC*    AMPICILLIN >=32 RESISTANT Resistant     CEFAZOLIN <=4 SENSITIVE Sensitive     CEFEPIME <=0.12 SENSITIVE Sensitive     CEFTAZIDIME <=1 SENSITIVE Sensitive     CEFTRIAXONE <=0.25 SENSITIVE Sensitive     CIPROFLOXACIN <=0.25 SENSITIVE Sensitive     GENTAMICIN <=1 SENSITIVE Sensitive     IMIPENEM <=0.25 SENSITIVE Sensitive  TRIMETH/SULFA >=320 RESISTANT Resistant     AMPICILLIN/SULBACTAM >=32 RESISTANT Resistant     PIP/TAZO <=4 SENSITIVE Sensitive     * RARE ESCHERICHIA COLI   CULTURE, BLOOD (ROUTINE X 2) w Reflex to ID Panel     Status: None   Collection Time: 06/04/21  6:35 PM   Specimen: BLOOD  Result Value Ref Range Status   Specimen Description BLOOD BLOOD LEFT HAND  Final   Special Requests   Final    BOTTLES DRAWN AEROBIC AND ANAEROBIC Blood Culture adequate volume   Culture   Final    NO GROWTH 5 DAYS Performed at [facility], [address] [place], [place] [address]    Report Status 06/09/2021 FINAL  Final    Coagulation Studies: No results for input(s): LABPROT, INR in the last 72 hours.   Urinalysis: No results for input(s): COLORURINE, LABSPEC, PHURINE, GLUCOSEU, HGBUR, BILIRUBINUR, KETONESUR, PROTEINUR, UROBILINOGEN, NITRITE, LEUKOCYTESUR in the last 72 hours.  Invalid input(s): APPERANCEUR     Imaging: DG Abd 1 View  Result Date: 06/09/2021 CLINICAL DATA:  NG tube placement. EXAM: ABDOMEN - 1 VIEW COMPARISON:  KUB, most recently 06/09/2021. CT chest and pelvis, 06/03/2018. FINDINGS: Support lines: Enteric feeding tube, with tip and side port within stomach. Imaged bowel gas is nonobstructed. No radio-opaque calculi or other significant radiographic abnormality are seen. IMPRESSION: 1. Gastric placement of enteric feeding tube 2. Nonobstructed gas pattern of the imaged bowel. Electronically Signed   By: Michaelle Birks M.D.   On: 06/09/2021 10:27   DG Abd 1 View  Result Date: 06/09/2021 CLINICAL DATA:  Nasogastric tube placement EXAM: ABDOMEN - 1 VIEW COMPARISON:  06/04/2021 FINDINGS: Enteric tube descends below the diaphragm with the side port and tip overlying the stomach. This appears to be a different tube than on the prior 06/04/2021 radiograph. Nonobstructive bowel-gas pattern. No portal venous gas or pneumatosis. The lung bases are clear. No acute skeletal abnormality. IMPRESSION: Enteric tube in appropriate position. Electronically Signed   By: Yvonne Kendall M.D.   On: 06/09/2021 10:25     Medications:    amiodarone 30  mg/hr (06/10/21 0933)    ceFAZolin (ANCEF) IV 2 g (06/10/21 1008)   feeding supplement (VITAL 1.5 CAL) 30 mL/hr at 06/09/21 2038   thiamine injection 500 mg (06/10/21 1134)    sodium chloride   Intravenous Once   chlorhexidine gluconate (MEDLINE KIT)  15 mL Mouth Rinse BID   Chlorhexidine Gluconate Cloth  6 each Topical Daily   collagenase   Topical Daily   docusate  100 mg Per Tube BID   feeding supplement (PROSource TF)  45 mL Per Tube Daily   free water  30 mL Per Tube Q4H   hydrocerin   Topical BID   hydrocortisone sod succinate (SOLU-CORTEF) inj  50 mg Intravenous Q6H   insulin aspart  3-9 Units Subcutaneous Q4H   lidocaine  1 patch Transdermal Q24H   mouth rinse  15 mL Mouth Rinse BID   metoCLOPramide (REGLAN) injection  5 mg Intravenous Q8H   midodrine  10 mg Per Tube TID WC   multivitamin  1 tablet Per Tube QHS   nutrition supplement (JUVEN)  1 packet Per Tube BID BM   pantoprazole (PROTONIX) IV  40 mg Intravenous Q24H   polyethylene glycol  17 g Per Tube Daily   sodium chloride flush  10-40 mL Intracatheter Q12H   [START ON 06/12/2021] thiamine injection  100 mg Intravenous Q24H   fentaNYL (SUBLIMAZE)  injection, guaiFENesin-dextromethorphan, ipratropium-albuterol, lip balm, ondansetron **OR** ondansetron (ZOFRAN) IV, oxyCODONE, polyvinyl alcohol, sodium chloride flush  Assessment/ Plan:  [name] is a 26 y.o. black male with hypertension, insulin dependent diabetes mellitus type I, diabetic gastroparesis, diabetic neuropathy, tobacco use, THC use, right toe amputation, perirectal abscess, who is admitted to [facility] on 05/31/2021 for SIRS (systemic inflammatory response syndrome) ([place]) [R65.10] AKI (acute kidney injury) ([place]) [N17.9] Symptomatic anemia [D64.9] Acute renal failure superimposed on stage 5 chronic kidney disease, not on chronic dialysis, unspecified acute renal failure type ([place]) [N17.9, N18.5] Hypertension, unspecified type [I10] Acute cough  [R05.1]  Cardiac arrest with code blue 06/01/2021 at 10 am. Transferred to ICU. Intubated  Acute kidney injury on chronic kidney disease stage IV versus progression of chronic kidney disease to end stage renal disease.  Patient has baseline creatinine of 4.13, GFR of 19 on 08/26/20.  Patient has history of nephrotic range proteinuria and hematuria. No history of renal biopsy. Patient has strong family history of dialysis with mother with ESRD . Patient is in room hemodynamically better than yesterday Patient was earlier requiring vasopressors Patient is now off vasopressors Will discontinue CRRT Will follow closely for the need of renal placement therapy. Patient does now has a temporary femoral dialysis catheter    Acute respiratory failure Patient was extubated then re-intubated 06/03/2021; patient is now extubated 06/08/2021 Pulmonary team is following  Hypotension with cardiogenic shock  -Pt was requiring vasopressors now off .  Patient is hemodynamically fragile  4. Insulin dependent Diabetes type 1 with CKD and proteinuria   Lab Results  Component Value Date   HGBA1C 5.7 (H) 05/31/2021    5. Thrombocytopenia Patient platelet counts remain low Patient did receive platelet transfusion on July 25 Hematology and primary team is following  Plan  I spent more than 60 minutes in patient's care/care coordination/decision making I was involved this morning when I was called about patient's catheter being clotted.   I then discussed the patient's case with the ICU team and I did discuss about use for activities and when activating her work then we discussed about request for another catheter placement Temporary catheter was placed and patient continued on CRRT Patient was later seen patient was symptomatically better than before So we will discontinue CRRT for now and will continue to follow    LOS: 10 Jonathan Mcmillan s Jonathan Mcmillan 1/28/20234:57 PM

## 2021-06-10 NOTE — Progress Notes (Signed)
New temporary double lumen HD line placed in the right femoral by Britton-Lee, NP. CRRT restarted using new line without issue. Old HD line in left IJ removed and found to be clotted off/unusable. Patient is comfortable and resting at this time. Vital signs still stable.  Cameron Ali, RN

## 2021-06-10 NOTE — Progress Notes (Signed)
NAME:  Jonathan Mcmillan, MRN:  [id number], DOB:  06-29-1995, LOS: 17 ADMISSION DATE:  05/31/2021, CONSULTATION DATE:  06/01/2021 REFERRING MD:  Dr. Leslye Peer, CHIEF COMPLAINT:  Cardiac Arrest   Brief Pt Description / Synopsis:  26 y.o. admitted with AKI on CKD in setting of viral syndrome and diarrhea.  Suffered in-hospital cardiac arrest (initial rhythm asystole), suspect due to severe metabolic acidosis and multiple metabolic derangements.  History of Present Illness:  Jonathan Mcmillan is a 27 year old male with a past medical history significant for diabetes mellitus type 1, chronic kidney disease stage IV, hypertension, nicotine dependence who presented to [facility] ED on 05/31/2021 due to 3-day history of fever, chills, nonproductive cough, myalgias, and diarrhea. Patient denied urinary frequency, nocturia, dysuria, abdominal pain, hematemesis, chest pain, shortness of breath, edema.  ED Course: Initial vital signs: Temperature 102.6, RR 22, HR 104, BP 139/70, SpO2 97% on room air Significant labs: Sodium 131, potassium 4.4, chloride 103, bicarb 15, glucose 177, BUN 19, creatinine 13.86 (baseline of 3.92), calcium 6.7, alkaline phosphatase 70, lipase 29, AST 24, ALT 33, lactic acid 2.1, WBC 12.7, hemoglobin 5.2 (baseline of 8.3) hematocrit 15.8, MCV 89.8, RDW 12.8, platelets 172, PT 15.8, INR 1.3. COVID-19 and influenza PCR negative Respiratory viral panel negative Urinalysis with proteinuria Imaging: Chest x-ray negative for acute cardiopulmonary disease CT Chest w/o contrast>>1. Trace pericardial effusion and mild diffuse body wall edema, new since 01/02/2020. 2. Clear lungs, with no focal consolidation or pleural effusion. 3. Hypodense blood pool consistent with anemia. Renal US>>Increased renal echogenicity compatible with medical renal disease. Retroperitoneal perinephric edema suspect related to volume overload.  Patient was admitted by the hospitalist for further work-up and treatment of  acute kidney injury on CKD.  Nephrology was consulted.  Interval History Early in the morning on 06/01/2021 rapid response was called when he was found in the shower on a chair passed out. Found to be  hypotensive, hypoglycemic, and lethargic with his "eyes rolling in the back of his head."  Patient was given 500 cc fluid bolus and ordered for blood transfusion.  However CODE BLUE was called as pt was found to be in asystole (progressed to PEA).  He received epinephrine x2, sodium bicarbonate x2, calcium x1, glucose and insulin.  Pulse was regained and he was transferred to ICU.  Upon arrival to the ICU patient patient is critically ill and concern for decorticate posturing.  PCCM consulted for further management.  Pertinent  Medical History  Chronic Kidney Disease Stage IV Diabetes Mellitus 1  Micro Data:  05/31/2021: SARS-CoV-2 and influenza PCR>> negative 05/31/2021: Respiratory viral panel>> negative 05/31/2021: Group A strep PCR>> not detected 05/31/2021: Blood culture x2>> no growth to date 05/31/2021: GI panel>> negative 05/31/2021: C. Difficile>> negative 06/01/2021: Tracheal aspirate>> normal respiratory flora 06/01/2021: Urine>>no growth 06/01/2021: Blood culture>>no growth 06/02/2021: MRSA PCR >>negative 06/04/2021: Tracheal aspirate>> E. Coli (resistant to ampicillin, Unasyn, Bactrim) 06/04/2021: Blood culture>> NGTD 06/04/2021: Blood Fungus>>NegTD  Antimicrobials:  Ceftriaxone 1/19 x 1 dose Vancomycin 1/19>>1/21 Zosyn 1/19>>1/22 Flagyl 1/22>>1/23 Unasyn 1/23>>1/25 Cefepime 1/23 x1 dose, restarted 1/25>>1/26 Cefazolin 1/26>>  Significant Hospital Events: Including procedures, antibiotic start and stop dates in addition to other pertinent events   1/18: Admitted by the hospitalist.  Nephrology consulted, plans for vascular to place temporary dialysis catheter with initiation of dialysis 1/19: In-hospital cardiac arrest.  Critically ill. Transferred to ICU.  Plan for CRRT.  Central  line, A line, and Trialysis catheter placed.  Concern for possible anoxic brain injury.  Sepsis workup in progress 1/20: Pt is awake, tracking, following simple commands. Remains on CRRT, bicarb gtt d/c.  Weaning fiO2 and pressors. Tracheal aspirate growing gram + cocci, gram + rods, gram - rods 1/21: Extubated; leukocytosis, thrombocytopenia 1/22: Reintubated overnight; apparent large volume aspiration; worsening shock 1/23 remains with multiorgan failure, plt transfusion, went into A. fib RVR, amiodarone started 1/24 severe multiorgan failure 1/26:  Plan to place Trialysis in IJ and remove femoral, transfuse 1 unit plt prior to procedure. transfuse 1 unit prbc (hgb 6.7).  Tolerating PSV, hopeful for liberation from vent following line placement.  ABX changed to Cefazolin due to Cx/S. EXTUBATED 1/27: Remains extubated, weaned to room air.  Weaning vasopressors.  Ordered for 2 units pRBC's due to Hgb 5.6. Connective tissue workup ordered 1/28: Coming off CRRT.  More interactive.  Swallow eval today passed to dysphagia 1  Interim History / Subjective:  -Extubated 1/26 evening, remains extubated, weaned to RA -Awake and alert, confused to situation -No significant events reported overnight -Pressors weaned off -Hgb 7.7 this am (8.3), received 2 units PRBCs 1/27 -New femoral trialysis placed on right femoral vein 1/27 due to clotting of left IJ dialysis catheter -Remains on CRRT, plan is to transition off CRRT today -Leukocytosis improved to 22.7 (24.8)  Objective   Blood pressure 121/82, pulse 69, temperature 98.4 F (36.9 C), temperature source Oral, resp. rate 15, height 5' 7.99" (1.727 m), weight 61.6 kg, SpO2 97 %.    FiO2 (%):  [21 %] 21 %   Intake/Output Summary (Last 24 hours) at 06/10/2021 1741 Last data filed at 06/10/2021 1008 Gross per 24 hour  Intake 820.57 ml  Output 1039 ml  Net -218.43 ml    Filed Weights   06/08/21 1400 06/09/21 0436 06/10/21 0315  Weight: 65.1 kg 65.3  kg 61.6 kg    Examination: General: Acutely ill-appearing male, Awake, on RA, in NAD Lungs: Coarse breath sounds bilaterally, no wheezing,, even, nonlabored, no assessory muscle use Cardiovascular: regular rate and rhythm, no MRG Abdomen: Soft, nontender, nondistended, no guarding or rebound tenderness, +BS Extremities: 1+ pitting edema to shins bilaterally Neuro: Awake and alert, disoriented to situation, moving all extremities to command, nonf-ocal exam GU: Foley catheter in place with minimal urine output Skin: Thick, query sclerodactyly?  Resolved Hospital Problem list     Assessment & Plan:   Acute hypoxic respiratory failure in setting of Cardiac Arrest, Aspiration Pneumonia & New HFrEF -EXTUBATED 1/26 -Supplemental O2 as needed to maintain O2 sats >92% -Follow intermittent Chest X-ray & ABG as needed -Prn Bronchodilators -Continue ABX as above -Volume removal with HD/CRRT -Ensure pulmonary hygiene -Aspiration precautions, patient passed to dysphagia 1  Septic shock +/- Cardiogenic Shock New HFrEF: -Echocardiogram 06/01/21>>LVEF 35-40% and global hypokinesis, grade II diastolic dysfunction, mildly reduced RV function, and moderately increased Pulmonary systolic pressure In-Hospital-Cardiac Arrest due to severe metabolic acidosis and multiple metabolic derangements A. fib RVR 1/23 on amiodarone Mildly elevated troponin due to demand ischemia PMHx: Hypertension -Continuous cardiac monitoring -Maintain MAP >65 -Vasopressors as needed to maintain MAP goal -Transfusions as indicated -Continue Midodrine -Stress dose steroids -HS Troponin peaked at 386 -Transition amiodarone to p.o.  Severe Sepsis due to Aspiration Pneumonia (E.coli) -Monitor fever curve -Trend WBC's & Procalcitonin -Follow cultures as above -Narrow to Cefazolin on 1/16 based on cultures & sensitivities  Anemia of Chronic Disease Thrombocytopenia (4T score is 3-4) in setting of Sepsis -Monitor for  S/Sx of bleeding -Trend CBC -SCD's for VTE Prophylaxis  -Transfuse for  Hgb <7 ~ to receive 2 units pRBC's 1/27 -Transfuse Platelets for platelet count <10K, today's count 33K -Hematology following, appreciate input -Workup negative for DIC or HIT -Connective tissue disease work-up pending  AKI on CKD Stage IV -Monitor I&O's / urinary output -Follow BMP -Ensure adequate renal perfusion -Avoid nephrotoxic agents as able -Replace electrolytes as indicated -Nephrology following, appreciate input ~ Continues on CRRT, transitioning to intermittent hemodialysis  PMHx: DM type I -CBG's q4h; Target range of 140 to 180 -SSI -Follow ICU Hypo/Hyperglycemia protocol   Pt is critically ill with multiorgan failure.  Prognosis is guarded, high risk for further cardiac arrest and death.   Best Practice (right click and "Reselect all SmartList Selections" daily)   Diet/type: NPO, tube feeds, swallow eval today DVT prophylaxis: SCD (holding chemical prophylaxis due to thrombocytopenia) GI prophylaxis: PPI Lines: Central line and yes and it is still needed Foley:  Yes, and it is still needed Code Status:  full code Last date of multidisciplinary goals of care discussion [06/09/21]   Labs   CBC: Recent Labs  Lab 06/04/21 0936 06/04/21 1126 06/04/21 2308 06/05/21 0417 06/06/21 0423 06/07/21 0435 06/07/21 2004 06/08/21 0407 06/09/21 0411 06/09/21 1338 06/10/21 0500  WBC 24.1*   < > 29.3* 30.3*   < > 32.6* 34.2* 31.7* 24.8*  --  22.7*  NEUTROABS 19.3*  --  21.9* 26.2*  --  27.6* 26.5*  --   --   --   --   HGB 8.9*   < > 8.7* 8.3*   < > 7.8* 7.3* 6.7* 5.6* 8.3* 7.7*  HCT 25.4*   < > 24.8* 23.8*   < > 22.7* 20.9* 18.5* 15.8* 23.2* 21.7*  MCV 84.9   < > 86.1 86.9   < > 87.6 85.3 82.6 85.9  --  84.1  PLT 10*   < > 29* 15*   < > 9* 21* 17* 23*  --  33*   < > = values in this interval not displayed.     Basic Metabolic Panel: Recent Labs  Lab 06/06/21 0420 06/06/21 1530  06/07/21 0435 06/07/21 1530 06/08/21 0407 06/08/21 1703 06/09/21 0411 06/09/21 1600 06/10/21 0500  NA 137   < > 136   135   < > 137   136 137 136   134* 139 134*  K 3.7   < > 3.7   3.7   < > 3.6   3.5 3.9 3.8   3.8 4.1 3.8  CL 104   < > 105   104   < > 106   107 105 105   104 103 103  CO2 25   < > 24   23   < > _0 GLUCOSE 147*   < > 196*   194*   < > 158*   157* 122* 92   93 167* 161*  BUN 19   < > 20   20   < > 28*   27* 33* 32*   32* 31* 36*  CREATININE 1.24   < > 1.05   0.99   < > 1.19   1.21 1.16 1.31*   1.24 1.34* 1.75*  CALCIUM 7.4*   < > 7.2*   7.2*   < > 6.7*   6.6* 6.8* 6.7*   6.6* 7.3* 6.7*  MG 2.4  --  2.5*  --  2.4  --  2.4  --  2.5*  PHOS 1.6*   < > 1.9*   1.9*   < > 2.6   2.6 2.7 2.0*   2.1* 2.5 3.0   < > = values in this interval not displayed.    GFR: Estimated Creatinine Clearance: 55.2 mL/min (A) (by C-G formula based on SCr of 1.75 mg/dL (H)). Recent Labs  Lab 06/04/21 0016 06/04/21 0158 06/04/21 0210 06/04/21 0234 06/04/21 0936 06/04/21 1126 06/05/21 0410 06/05/21 0417 06/06/21 0420 06/06/21 0423 06/07/21 2004 06/08/21 0407 06/09/21 0411 06/10/21 0500  PROCALCITON  --  34.14  --   --   --   --  49.08  --  39.56  --   --   --   --   --   WBC 22.5*  --   --    < > 24.1*   < >  --    < >  --    < > 34.2* 31.7* 24.8* 22.7*  LATICACIDVEN 2.8*  --  2.8*  --  3.3*  --   --   --   --   --   --   --   --   --    < > = values in this interval not displayed.     Liver Function Tests: Recent Labs  Lab 06/04/21 0016 06/04/21 0508 06/08/21 0407 06/08/21 1703 06/09/21 0411 06/09/21 1600 06/10/21 0500  AST 18  --   --   --  25  --   --   ALT 39  --   --   --  29  --   --   ALKPHOS 89  --   --   --  194*  --   --   BILITOT 2.3*  --   --   --  2.7*  --   --   PROT 4.6*  --   --   --  4.8*  --   --   ALBUMIN 1.8*   < > 1.7* 1.6* 1.6*   1.7* 1.8* 1.7*   < > = values in this interval not displayed.    Recent Labs  Lab  06/08/21 0407  LIPASE 16    Recent Labs  Lab 06/04/21 0016  AMMONIA 23     ABG    Component Value Date/Time   PHART 7.35 06/06/2021 1038   PCO2ART 46 06/06/2021 1038   PO2ART 124 (H) 06/06/2021 1038   HCO3 25.4 06/06/2021 1038   TCO2 22 06/24/2007 0045   ACIDBASEDEF 0.3 06/06/2021 1038   O2SAT 98.6 06/06/2021 1038      Coagulation Profile: Recent Labs  Lab 06/04/21 0234  INR 1.3*     Cardiac Enzymes: No results for input(s): CKTOTAL, CKMB, CKMBINDEX, TROPONINI in the last 168 hours.   HbA1C: Hemoglobin-A1c  Date/Time Value Ref Range Status  06/26/2007 06:40 AM   Final   17.6% HIGH (NOTE) Reference Intervals:  Diabetic Adult        <9.0 Healthy Adult         3.9 - 7.3 Current ADA guidelines recommend a treatment goal of <7.0% HgbA1c for diabetic patients, which corresponds to a <9.0% Glycohemoglobin result with this method.   Performed at:  [facility]               [place], [place]  [id number]   Hemoglobin A1C  Date/Time Value Ref Range Status  08/05/2013 01:26 PM  12.9 (H) 4.2 - 6.3 % Final    Comment:    The American Diabetes Association recommends that a primary goal of therapy should be <7% and that physicians should reevaluate the treatment regimen in patients with HbA1c values consistently >8%.    Hgb A1c MFr Bld  Date/Time Value Ref Range Status  05/31/2021 12:05 PM 5.7 (H) 4.8 - 5.6 % Final    Comment:    RESULTS CONFIRMED BY MANUAL DILUTION REPEATED TO VERIFY (NOTE) Pre diabetes:          5.7%-6.4%  Diabetes:              >6.4%  Glycemic control for   <7.0% adults with diabetes   06/11/2020 03:41 AM 7.0 (H) 4.8 - 5.6 % Final    Comment:    (NOTE) Pre diabetes:          5.7%-6.4%  Diabetes:              >6.4%  Glycemic control for   <7.0% adults with diabetes     CBG: Recent Labs  Lab 06/10/21 0309 06/10/21 0746 06/10/21 0748 06/10/21 1200 06/10/21 1721  GLUCAP 199* 361* 222* 224* 386*      Review of Systems:   Positives in BOLD: Gen: Denies fever, chills, weight change, fatigue, night sweats HEENT: Denies blurred vision, double vision, hearing loss, tinnitus, sinus congestion, rhinorrhea, sore throat, neck stiffness, dysphagia PULM: Denies shortness of breath, cough, sputum production, hemoptysis, wheezing CV: Denies chest pain, edema, orthopnea, paroxysmal nocturnal dyspnea, palpitations GI: Denies abdominal pain, nausea, vomiting, diarrhea, hematochezia, melena, constipation, change in bowel habits GU: Denies dysuria, hematuria, polyuria, oliguria, urethral discharge Endocrine: Denies hot or cold intolerance, polyuria, polyphagia or appetite change Derm: Denies rash, dry skin, scaling or peeling skin change Heme: Denies easy bruising, bleeding, bleeding gums Neuro: Denies headache, numbness, weakness, slurred speech, loss of memory or consciousness    Allergies No Known Allergies   Medications  Scheduled Meds:  sodium chloride   Intravenous Once   chlorhexidine gluconate (MEDLINE KIT)  15 mL Mouth Rinse BID   Chlorhexidine Gluconate Cloth  6 each Topical Daily   collagenase   Topical Daily   docusate  100 mg Per Tube BID   hydrocerin   Topical BID   hydrocortisone sod succinate (SOLU-CORTEF) inj  50 mg Intravenous Q6H   insulin aspart  3-9 Units Subcutaneous Q4H   lidocaine  1 patch Transdermal Q24H   mouth rinse  15 mL Mouth Rinse BID   metoCLOPramide (REGLAN) injection  5 mg Intravenous Q8H   midodrine  10 mg Per Tube TID WC   multivitamin  1 tablet Per Tube QHS   pantoprazole (PROTONIX) IV  40 mg Intravenous Q24H   polyethylene glycol  17 g Per Tube Daily   sodium chloride flush  10-40 mL Intracatheter Q12H   [START ON 06/12/2021] thiamine injection  100 mg Intravenous Q24H   Continuous Infusions:  amiodarone 30 mg/hr (06/10/21 0933)    ceFAZolin (ANCEF) IV 2 g (06/10/21 1008)   thiamine injection 500 mg (06/10/21 1134)   PRN Meds:.fentaNYL  (SUBLIMAZE) injection, guaiFENesin-dextromethorphan, ipratropium-albuterol, lip balm, ondansetron **OR** ondansetron (ZOFRAN) IV, oxyCODONE, polyvinyl alcohol, sodium chloride flush    Critical care time: 40 minutes    We will proceed with speech path evaluation today to see if can transition him from tube feeds to p.o.  If tolerates p.o.'s well will switch to amiodarone p.o.  Renold Don, MD Advanced Bronchoscopy PCCM [place]  [facility]    *This note was dictated using voice recognition software/Dragon.  Despite best efforts to proofread, errors can occur which can change the meaning. Any transcriptional errors that result from this process are unintentional and may not be fully corrected at the time of dictation.

## 2021-06-10 NOTE — Progress Notes (Signed)
Inpatient Rehab Admissions Coordinator Note:   Per PT patient was screened for CIR candidacy by [name], CCC-SLP. At this time, pt was not able to attempt bed mobility or transfers during PT evaluation. Pt may have potential to progress to becoming a potential CIR candidate so CIR admissions team will follow and monitor for progress and participation with therapies.    Jonathan Mcmillan, Jonathan Mcmillan, [place] Admissions Coordinator [phone] 06/10/21 5:02 PM

## 2021-06-10 NOTE — Progress Notes (Signed)
Per Dr. Theador Hawthorne- MD gave verbal order to discontinue CRRT.  CRRT discontinued and blood rinsed back- patient vitals stable.

## 2021-06-10 NOTE — Evaluation (Signed)
Occupational Therapy Evaluation Patient Details Name: Jonathan Mcmillan MRN: [id number] DOB: [birth year] Today's Date: 06/10/2021   History of Present Illness Pt is a 26 y/o M admitted on 06/01/21 with AKI on CKD in setting of viral syndrome & diarrhea. Pt suffered in-hospital cardiac arrest, suspect due to severe metabolic acidosis & multiple metabolic derangements. Required CRRT and now preparing to undergo regular HD with femoral catheter placed on 1/28. Pt was extubated on 1/21 but reintubated 1/22. Extubated again on 1/26 & now weaned to room air. PMH: DM2, CKD stage IV, HTN, nicotine dependence.   Clinical Impression   Pt seen for OT evaluation this date in setting of acute hospitalization 2/2 AKI with complications including 2 intubations. Pt reports being INDEP at baseline including working. He presents this date with significant deconditioning, global atrophy, and decreased fxl activity tolerance. On OT assessment, he currently requires:  MIN/MOD A for bed level (high-fowler's position) UB ADLs including washing face d/t decreased grip strength-limited ability to grasp and maintain grasp on necessary ADL items. Pt requires MAX/TOTAL A +2 for bed level (using lateral rolling technique) LB ADLs includuing bathing, peri care, and dressing. Pt able to perform lateral rolling with MOD A. Full bath and bed change completed this session d/t pt being soiled, seemingly d/t FMS malfunction. RN and CNA assisted with linen change, bathing tasks and application of topical medication. OT engages pt in grip strengthening exercise and ed re: rationale. Pt left with all needs met and in reach end of session. Will continue to follow acutely. Anticipate pt will require extensive rehabilitative efforts and f/u upon d/c from acute hospital stay. Recommending f/u in acute inpatient rehab setting.     Recommendations for follow up therapy are one component of a multi-disciplinary discharge planning process, led by the  attending physician.  Recommendations may be updated based on patient status, additional functional criteria and insurance authorization.   Follow Up Recommendations  Acute inpatient rehab (3hours/day)    Assistance Recommended at Discharge Frequent or constant Supervision/Assistance  Patient can return home with the following A lot of help with walking and/or transfers;A lot of help with bathing/dressing/bathroom;Assistance with cooking/housework;Direct supervision/assist for medications management;Assist for transportation;Help with stairs or ramp for entrance    Functional Status Assessment  Patient has had a recent decline in their functional status and demonstrates the ability to make significant improvements in function in a reasonable and predictable amount of time.  Equipment Recommendations  Other (comment) (TBD once more mobility assessment is able to be obtained.)    Recommendations for Other Services       Precautions / Restrictions Precautions Precautions: Fall Precaution Comments: R temporary femoral dialysis catheter, per Dr. Ulice Bold, not to get OOB with therapy at this time. Restrictions Weight Bearing Restrictions: No      Mobility Bed Mobility Overal bed mobility: Needs Assistance Bed Mobility: Rolling Rolling: Mod assist              Transfers                   General transfer comment: deferred-temporary femoral catheter for HD      Balance                                           ADL either performed or assessed with clinical judgement   ADL  General ADL Comments: MIN/MOD A for bed level (high-fowler's position) UB ADLs including washing face d/t decreased grip strength-limited ability to grasp and maintain grasp on necessary ADL items. Pt requires MAX/TOTAL A +2 for bed level (using lateral rolling technique) LB ADLs includuing bathing, peri care, and  dressing. Pt able to perform lateral rolling with MOD A.     Vision   Additional Comments: tracks appropriately, eyes noted to be very dry and pt blinks a limited amount. Vision appears to be at baseline     Perception     Praxis      Pertinent Vitals/Pain Pain Assessment Pain Assessment: No/denies pain     Hand Dominance     Extremity/Trunk Assessment Upper Extremity Assessment Upper Extremity Assessment: Generalized weakness   Lower Extremity Assessment Lower Extremity Assessment: Generalized weakness       Communication Communication Communication: No difficulties   Cognition Arousal/Alertness: Awake/alert Behavior During Therapy: Flat affect Overall Cognitive Status: Within Functional Limits for tasks assessed                                 General Comments: Pt is oriented x4, appropriate, but slow to respond/slow to process.     General Comments       Exercises Other Exercises Other Exercises: OT engages pt in b/l hand grip exercise x10 per hand using wash cloth for resistance.   Shoulder Instructions      Home Living Family/patient expects to be discharged to:: Private residence Living Arrangements: Other relatives (2 aunts) Available Help at Discharge: Family;Available PRN/intermittently Type of Home: House Home Access: Stairs to enter Entrance Stairs-Number of Steps: 6   Home Layout: One level                          Prior Functioning/Environment Prior Level of Function : Independent/Modified Independent;Driving;Working/employed                        OT Problem List: Decreased strength;Decreased range of motion;Decreased activity tolerance;Decreased cognition;Decreased knowledge of use of DME or AE;Cardiopulmonary status limiting activity;Impaired UE functional use;Pain      OT Treatment/Interventions: Self-care/ADL training;Therapeutic exercise;DME and/or AE instruction;Therapeutic activities;Patient/family  education;Balance training    OT Goals(Current goals can be found in the care plan section) Acute Rehab OT Goals Patient Stated Goal: to get stronger OT Goal Formulation: With patient Time For Goal Achievement: 06/24/21 Potential to Achieve Goals: Fair ADL Goals Pt Will Perform Eating: with set-up (supported sitting-chair position in bed) Pt Will Perform Grooming: with set-up Pt Will Perform Upper Body Bathing: with min guard assist (CGA for long-sitting in bed to address back of neck/shoulders while UB bathing) Pt/caregiver will Perform Home Exercise Program: Increased strength;Both right and left upper extremity;With minimal assist Additional ADL Goal #1: Pt will tolerate further mobilization (once cleared by nephrology; at least sup to sit with MAX A) to allow for further development of OT POC.  OT Frequency: Min 3X/week    Co-evaluation              AM-PAC OT "6 Clicks" Daily Activity     Outcome Measure Help from another person eating meals?: A Little Help from another person taking care of personal grooming?: A Little Help from another person toileting, which includes using toliet, bedpan, or urinal?: A Lot Help from another person bathing (including washing, rinsing, drying)?: A  Lot Help from another person to put on and taking off regular upper body clothing?: A Little Help from another person to put on and taking off regular lower body clothing?: Total 6 Click Score: 14   End of Session Nurse Communication: Mobility status  Activity Tolerance: Patient tolerated treatment well Patient left: in bed;with call bell/phone within reach;with bed alarm set  OT Visit Diagnosis: Muscle weakness (generalized) (M62.81);Adult, failure to thrive (R62.7)                Time: 5844-6520 OT Time Calculation (min): 54 min Charges:  OT General Charges $OT Visit: 1 Visit OT Evaluation $OT Eval Moderate Complexity: 1 Mod OT Treatments $Self Care/Home Management : 23-37  mins $Therapeutic Activity: 23-37 mins  Gerrianne Scale, MS, OTR/L ascom [phone] 06/10/21, 5:56 PM

## 2021-06-10 NOTE — Evaluation (Signed)
Physical Therapy Evaluation Patient Details Name: Jonathan Mcmillan MRN: [id number] DOB: 1995/11/16 Today's Date: 06/10/2021  History of Present Illness  Pt is a 26 y/o M admitted on 06/01/21 with AKI on CKD in setting of viral syndrome & diarrhea. Pt suffered in-hospital cardiac arrest, suspect due to severe metabolic acidosis & multiple metabolic derangements. Pt was extubated on 1/21 but reintubated 1/22 but extubated again on 1/26 & now weaned to room air. PMH: DM2, CKD stage IV, HTN, nicotine dependence  Clinical Impression  Pt seen for PT evaluation with pt reporting prior to admission he was independent without AD, working & driving. Pt states he lives with his aunt in a home with 6 steps to enter. PT evaluation is limited by pt having R femoral temporary dialysis catheter so pt performs LLE strengthening exercises with active assist from PT 2/2 weakness. Discussed with nursing staff pt's need to sit in bed in chair position throughout the day. Recommend CIR upon d/c to maximize independence & facilitate return to PLOF. Will continue to follow pt acutely to progress mobility as able.       Recommendations for follow up therapy are one component of a multi-disciplinary discharge planning process, led by the attending physician.  Recommendations may be updated based on patient status, additional functional criteria and insurance authorization.  Follow Up Recommendations Acute inpatient rehab (3hours/day)    Assistance Recommended at Discharge Frequent or constant Supervision/Assistance  Patient can return home with the following  Two people to help with walking and/or transfers;Two people to help with bathing/dressing/bathroom;Direct supervision/assist for medications management;Help with stairs or ramp for entrance;Assistance with feeding;Assist for transportation;Assistance with cooking/housework;Direct supervision/assist for financial management    Equipment Recommendations None recommended  by PT  Recommendations for Other Services       Functional Status Assessment Patient has had a recent decline in their functional status and demonstrates the ability to make significant improvements in function in a reasonable and predictable amount of time.     Precautions / Restrictions Precautions Precautions: Fall Precaution Comments: R temporary femoral dialysis catheter Restrictions Weight Bearing Restrictions: No      Mobility  Bed Mobility                    Transfers                        Ambulation/Gait                  Stairs            Wheelchair Mobility    Modified Rankin (Stroke Patients Only)       Balance                                             Pertinent Vitals/Pain Pain Assessment Pain Assessment: No/denies pain    Home Living Family/patient expects to be discharged to:: Private residence Living Arrangements: Other relatives (aunt)   Type of Home: House Home Access: Stairs to enter   Technical brewer of Steps: 6   Home Layout: One level        Prior Function Prior Level of Function : Independent/Modified Independent;Driving;Working/employed                     Hand Dominance        Extremity/Trunk  Assessment   Upper Extremity Assessment Upper Extremity Assessment: Generalized weakness    Lower Extremity Assessment Lower Extremity Assessment: Generalized weakness       Communication   Communication: No difficulties  Cognition Arousal/Alertness: Awake/alert Behavior During Therapy: Flat affect Overall Cognitive Status: Within Functional Limits for tasks assessed                                          General Comments      Exercises General Exercises - Lower Extremity Short Arc Quad: AAROM, Left, 10 reps, Supine, Strengthening Heel Slides: AAROM, Strengthening, Left, 10 reps, Supine Hip ABduction/ADduction: AAROM, Strengthening,  Left, 10 reps, Supine Straight Leg Raises: AAROM, Supine, Left, 10 reps   Assessment/Plan    PT Assessment Patient needs continued PT services  PT Problem List Decreased strength;Decreased mobility;Decreased activity tolerance;Decreased knowledge of precautions;Cardiopulmonary status limiting activity;Decreased skin integrity;Decreased balance;Decreased knowledge of use of DME       PT Treatment Interventions DME instruction;Therapeutic exercise;Balance training;Neuromuscular re-education;Gait training;Stair training;Functional mobility training;Therapeutic activities;Patient/family education;Modalities    PT Goals (Current goals can be found in the Care Plan section)  Acute Rehab PT Goals Patient Stated Goal: none stated PT Goal Formulation: With patient Time For Goal Achievement: 06/24/21 Potential to Achieve Goals: Good    Frequency Min 2X/week     Co-evaluation               AM-PAC PT "6 Clicks" Mobility  Outcome Measure Help needed turning from your back to your side while in a flat bed without using bedrails?: A Lot Help needed moving from lying on your back to sitting on the side of a flat bed without using bedrails?: A Lot Help needed moving to and from a bed to a chair (including a wheelchair)?: Total Help needed standing up from a chair using your arms (e.g., wheelchair or bedside chair)?: Total Help needed to walk in hospital room?: Total Help needed climbing 3-5 steps with a railing? : Total 6 Click Score: 8    End of Session   Activity Tolerance: Patient tolerated treatment well Patient left: in bed;with call bell/phone within reach;with bed alarm set Nurse Communication: Mobility status PT Visit Diagnosis: Difficulty in walking, not elsewhere classified (R26.2);Muscle weakness (generalized) (M62.81)    Time: 4270-6237 PT Time Calculation (min) (ACUTE ONLY): 9 min   Charges:   PT Evaluation $PT Eval High Complexity: 1 High          Lavone Nian, PT, DPT 06/10/21, 2:47 PM   Waunita Schooner 06/10/2021, 2:44 PM

## 2021-06-10 NOTE — Consult Note (Signed)
[facility] for Electrolyte Monitoring and Replacement   Recent Labs: Potassium (mmol/L)  Date Value  06/10/2021 3.8  08/05/2013 3.8   Magnesium (mg/dL)  Date Value  06/10/2021 2.5 (H)   Calcium (mg/dL)  Date Value  06/10/2021 6.7 (L)   Calcium, Total (PTH) (mg/dL)  Date Value  06/12/2020 8.0   Albumin (g/dL)  Date Value  06/10/2021 1.7 (L)  08/05/2013 2.9 (L)   Phosphorus (mg/dL)  Date Value  06/10/2021 3.0   Sodium (mmol/L)  Date Value  06/10/2021 134 (L)  08/05/2013 133 (L)   Assessment: Jonathan Mcmillan is a 26 y.o. male with medical history including diabetes, CKD, HTN, nicotine dependence admitted on 05/31/2021 with  AKI on CKD, anemia, and fevers/chills/myalgias . Patient suffered cardiac arrest on the morning of 1/19 and was ultimately intubated and transferred to the ICU. Patient is currently on CRRT per nephrology. Pharmacy has been consulted to monitor and replace electrolytes.   Goal of Therapy:  Electrolytes within normal limits  Plan:  --No replacement needed at this time.  --Continue to monitor renal function panels BID while on CRRT  [name], PharmD, BCPS Clinical Pharmacist 06/10/2021 10:34 AM

## 2021-06-11 LAB — GLUCOSE, CAPILLARY
Glucose-Capillary: 138 mg/dL — ABNORMAL HIGH (ref 70–99)
Glucose-Capillary: 160 mg/dL — ABNORMAL HIGH (ref 70–99)
Glucose-Capillary: 213 mg/dL — ABNORMAL HIGH (ref 70–99)
Glucose-Capillary: 217 mg/dL — ABNORMAL HIGH (ref 70–99)
Glucose-Capillary: 255 mg/dL — ABNORMAL HIGH (ref 70–99)
Glucose-Capillary: 87 mg/dL (ref 70–99)

## 2021-06-11 LAB — RENAL FUNCTION PANEL
Albumin: 1.8 g/dL — ABNORMAL LOW (ref 3.5–5.0)
Anion gap: 8 (ref 5–15)
BUN: 54 mg/dL — ABNORMAL HIGH (ref 6–20)
CO2: 23 mmol/L (ref 22–32)
Calcium: 6.8 mg/dL — ABNORMAL LOW (ref 8.9–10.3)
Chloride: 104 mmol/L (ref 98–111)
Creatinine, Ser: 2.3 mg/dL — ABNORMAL HIGH (ref 0.61–1.24)
GFR, Estimated: 39 mL/min — ABNORMAL LOW (ref >60)
Glucose, Bld: 164 mg/dL — ABNORMAL HIGH (ref 70–99)
Phosphorus: 3.2 mg/dL (ref 2.5–4.6)
Potassium: 3.3 mmol/L — ABNORMAL LOW (ref 3.5–5.1)
Sodium: 135 mmol/L (ref 135–145)

## 2021-06-11 LAB — CBC
HCT: 23.9 % — ABNORMAL LOW (ref 39.0–52.0)
Hemoglobin: 8.1 g/dL — ABNORMAL LOW (ref 13.0–17.0)
MCH: 29.6 pg (ref 26.0–34.0)
MCHC: 33.9 g/dL (ref 30.0–36.0)
MCV: 87.2 fL (ref 80.0–100.0)
Platelets: 35 10*3/uL — ABNORMAL LOW (ref 150–400)
RBC: 2.74 MIL/uL — ABNORMAL LOW (ref 4.22–5.81)
RDW: 15.2 % (ref 11.5–15.5)
WBC: 18.7 10*3/uL — ABNORMAL HIGH (ref 4.0–10.5)
nRBC: 0.2 % (ref 0.0–0.2)

## 2021-06-11 LAB — MAGNESIUM: Magnesium: 2.7 mg/dL — ABNORMAL HIGH (ref 1.7–2.4)

## 2021-06-11 MED ORDER — POTASSIUM CHLORIDE 20 MEQ PO PACK
20.0000 meq | PACK | Freq: Once | ORAL | Status: AC
  Administered 2021-06-11: 20 meq via ORAL
  Filled 2021-06-11: qty 1

## 2021-06-11 MED ORDER — CHLORHEXIDINE GLUCONATE CLOTH 2 % EX PADS
6.0000 | MEDICATED_PAD | Freq: Every day | CUTANEOUS | Status: DC
  Administered 2021-06-12 – 2021-06-22 (×10): 6 via TOPICAL

## 2021-06-11 MED ORDER — INSULIN ASPART 100 UNIT/ML IJ SOLN
10.0000 [IU] | Freq: Once | INTRAMUSCULAR | Status: AC
  Administered 2021-06-11: 10 [IU] via SUBCUTANEOUS
  Filled 2021-06-11: qty 1

## 2021-06-11 MED ORDER — CEFAZOLIN SODIUM-DEXTROSE 1-4 GM/50ML-% IV SOLN
1.0000 g | INTRAVENOUS | Status: DC
  Administered 2021-06-11 – 2021-06-15 (×4): 1 g via INTRAVENOUS
  Filled 2021-06-11 (×8): qty 50

## 2021-06-11 MED ORDER — HYDROCORTISONE SOD SUC (PF) 100 MG IJ SOLR
50.0000 mg | Freq: Two times a day (BID) | INTRAMUSCULAR | Status: AC
  Administered 2021-06-12 – 2021-06-13 (×4): 50 mg via INTRAVENOUS
  Filled 2021-06-11: qty 1
  Filled 2021-06-11 (×3): qty 2

## 2021-06-11 MED ORDER — INSULIN ASPART 100 UNIT/ML IJ SOLN
3.0000 [IU] | Freq: Once | INTRAMUSCULAR | Status: AC
  Administered 2021-06-11: 3 [IU] via SUBCUTANEOUS
  Filled 2021-06-11: qty 1

## 2021-06-11 MED ORDER — PANTOPRAZOLE SODIUM 40 MG PO TBEC
40.0000 mg | DELAYED_RELEASE_TABLET | Freq: Every day | ORAL | Status: DC
  Administered 2021-06-12 – 2021-06-22 (×8): 40 mg via ORAL
  Filled 2021-06-11 (×9): qty 1

## 2021-06-11 MED ORDER — POTASSIUM CHLORIDE 10 MEQ/100ML IV SOLN
10.0000 meq | INTRAVENOUS | Status: DC
  Filled 2021-06-11 (×2): qty 100

## 2021-06-11 MED ORDER — INSULIN GLARGINE-YFGN 100 UNIT/ML ~~LOC~~ SOLN
10.0000 [IU] | Freq: Every day | SUBCUTANEOUS | Status: DC
  Administered 2021-06-11 – 2021-06-12 (×2): 10 [IU] via SUBCUTANEOUS
  Filled 2021-06-11 (×2): qty 0.1

## 2021-06-11 NOTE — Progress Notes (Signed)
Speech Language Pathology Treatment:    Patient Details Name: Jonathan Mcmillan MRN: [id number] DOB: [birth year] Today's Date: 06/11/2021 Time: 0940-1000 SLP Time Calculation (min) (ACUTE ONLY): 20 min  Assessment / Plan / Recommendation Clinical Impression  Pt seen for clinical swallowing re-evaluation. Pt alert, slow to respond. Flat affect. Required verbal encouragement/education to participate. Pt on room air. Cleared with RN who noted pt with improved LOA and participation this date.   Pt given trials of thin liquids (via straw), applesauce, and solid. Pt present with s/sx mild oral dysphagia c/b mildly prolonged mastication and trace-mild oral residual with solids. Oral deficits likely due to dental status, ?lingual weakness, and mental status. No overt or subtle s/sx pharyngeal dysphagia noted across trials.   Recommend diet upgrade to mech soft diet with thin liquids and safe swallowing strategies/aspiration precuations as outlined below.   Pt is at increased risk for aspiration/aspiration PNA given dental status, mental status, multiple medical comorbidities, and need for assistance with feeding at present.   SLP to f/u per POC for diet tolerance.     HPI HPI: Per admitting H&P " Jonathan Mcmillan is a 26 y.o. male with medical history significant for diabetes mellitus with complications of stage IV chronic kidney disease, hypertension, nicotine dependence who presents to the emergency room for evaluation of a 3-day history of fever, chills, nonproductive cough, myalgias and diarrhea.  He denies having any sick contacts.  He denies having any urinary frequency, nocturia or dysuria and denies having any abdominal pain.  He denies having a headache, no sore throat, no chest pain, no shortness of breath, no back pain, no lower extremity swelling, no focal deficits of blurred vision.  He denies having any hematemesis, no melena stools or hematochezia and denies NSAID use.  Upon arrival to the ER  he was noted to have a T-max of 102.6, he was tachycardic and tachypneic.   Sodium 131, potassium 4.4, chloride 102, bicarb 15, glucose 177, BUN 19, creatinine 13.86 when compared to baseline of 3.92, calcium 6.7, alkaline phosphatase 70, albumin 2.9, lipase 29, AST 24, ALT 33, total protein 6.3, lactic acid 2.1, white count 12.7, hemoglobin 5.2 compared to baseline of 8.3, hematocrit 15.8, MCV 89.8, RDW 12.8, platelet count 172, PT 15.8, INR 1.3  Respiratory viral panel is negative  Urine analysis shows proteinuria  Chest x-ray reviewed by me shows no evidence of acute cardiopulmonary disease  Twelve-lead EKG reviewed by me shows sinus tachycardia with nonspecific T wave changes in the lateral leads."      SLP Plan  Continue with current plan of care      Recommendations for follow up therapy are one component of a multi-disciplinary discharge planning process, led by the attending physician.  Recommendations may be updated based on patient status, additional functional criteria and insurance authorization.    Recommendations  Diet recommendations: Dysphagia 3 (mechanical soft);Thin liquid Liquids provided via: Straw Medication Administration: Crushed with puree Supervision: Staff to assist with self feeding;Intermittent supervision to cue for compensatory strategies Compensations: Slow rate;Small sips/bites;Follow solids with liquid Postural Changes and/or Swallow Maneuvers: Out of bed for meals;Seated upright 90 degrees;Upright 30-60 min after meal                Oral Care Recommendations: Oral care before and after PO;Oral care BID Follow Up Recommendations: Skilled nursing-short term rehab (<3 hours/day) Assistance recommended at discharge: Frequent or constant Supervision/Assistance SLP Visit Diagnosis: Dysphagia, oropharyngeal phase (R13.12) Plan: Continue with current plan of care  Cherrie Gauze, M.S., [facility] [phone] ([place])   Quintella Baton  06/11/2021, 10:20 AM

## 2021-06-11 NOTE — Progress Notes (Signed)
[facility]  ROUNDING NOTE   Subjective:   Patient was extubated on 1/21 but had to be re-intubated for respiratory distress;Pt was  suspected to have aspirated Extubated on 06/08/21 Able to follow simple commands. Patient was seen today in the ICU Patient follows commands Patient does not offer any new physical complaints Patient is now off vasopressors     Objective:  Vital signs in last 24 hours:  Temp:  [97.8 F (36.6 C)-98.4 F (36.9 C)] 98.2 F (36.8 C) (01/29 0800) Pulse Rate:  [58-87] 65 (01/29 0800) Resp:  [7-23] 13 (01/29 0800) BP: (113-153)/(73-100) 129/92 (01/29 0800) SpO2:  [91 %-100 %] 96 % (01/29 0800) FiO2 (%):  [21 %] 21 % (01/29 0800) Weight:  [61.8 kg] 61.8 kg (01/29 0424)  Weight change: 0.2 kg Filed Weights   06/09/21 0436 06/10/21 0315 06/11/21 0424  Weight: 65.3 kg 61.6 kg 61.8 kg    Intake/Output: I/O last 3 completed shifts: In: 1884.7 [P.O.:600; I.V.:406; NG/GT:383; IV Piggyback:495.7] Out: 1334 [Urine:104; Other:530; Stool:700]   Intake/Output this shift:  Total I/O In: 200 [P.O.:200] Out: -   Physical Exam: General: Critically ill  Eyes: Anicteric  Lungs:  Nasal cannula oxygen.  Coarse breath sounds at bases  Heart:  Tachycardic, irregular  Abdomen:  Soft,    Extremities: + peripheral edema.  Neurologic: More alert than yesterday Following commands  Skin: No acute lesions  Access:  Rght femoral temporary dialysis catheter  Rectal tube, Foley in place  Basic Metabolic Panel: Recent Labs  Lab 06/07/21 0435 06/07/21 1530 06/08/21 0407 06/08/21 1703 06/09/21 0411 06/09/21 1600 06/10/21 0500 06/11/21 0334  NA 136   135   < > 137   136 137 136   134* 139 134* 135  K 3.7   3.7   < > 3.6   3.5 3.9 3.8   3.8 4.1 3.8 3.3*  CL 105   104   < > 106   107 105 105   104 103 103 104  CO2 24   23   < > 22   22 23 25   25 24 24 23   GLUCOSE 196*   194*   < > 158*   157* 122* 92   93 167* 161* 164*  BUN 20   20   < > 28*    27* 33* 32*   32* 31* 36* 54*  CREATININE 1.05   0.99   < > 1.19   1.21 1.16 1.31*   1.24 1.34* 1.75* 2.30*  CALCIUM 7.2*   7.2*   < > 6.7*   6.6* 6.8* 6.7*   6.6* 7.3* 6.7* 6.8*  MG 2.5*  --  2.4  --  2.4  --  2.5* 2.7*  PHOS 1.9*   1.9*   < > 2.6   2.6 2.7 2.0*   2.1* 2.5 3.0 3.2   < > = values in this interval not displayed.    Liver Function Tests: Recent Labs  Lab 06/08/21 1703 06/09/21 0411 06/09/21 1600 06/10/21 0500 06/11/21 0334  AST  --  25  --   --   --   ALT  --  29  --   --   --   ALKPHOS  --  194*  --   --   --   BILITOT  --  2.7*  --   --   --   PROT  --  4.8*  --   --   --  ALBUMIN 1.6* 1.6*   1.7* 1.8* 1.7* 1.8*   Recent Labs  Lab 06/08/21 0407  LIPASE 16   No results for input(s): AMMONIA in the last 168 hours.   CBC: Recent Labs  Lab 06/04/21 0936 06/04/21 1126 06/04/21 2308 06/05/21 0417 06/06/21 0423 06/07/21 0435 06/07/21 2004 06/08/21 0407 06/09/21 0411 06/09/21 1338 06/10/21 0500 06/11/21 0334  WBC 24.1*   < > 29.3* 30.3*   < > 32.6* 34.2* 31.7* 24.8*  --  22.7* 18.7*  NEUTROABS 19.3*  --  21.9* 26.2*  --  27.6* 26.5*  --   --   --   --   --   HGB 8.9*   < > 8.7* 8.3*   < > 7.8* 7.3* 6.7* 5.6* 8.3* 7.7* 8.1*  HCT 25.4*   < > 24.8* 23.8*   < > 22.7* 20.9* 18.5* 15.8* 23.2* 21.7* 23.9*  MCV 84.9   < > 86.1 86.9   < > 87.6 85.3 82.6 85.9  --  84.1 87.2  PLT 10*   < > 29* 15*   < > 9* 21* 17* 23*  --  33* 35*   < > = values in this interval not displayed.    Cardiac Enzymes: No results for input(s): CKTOTAL, CKMB, CKMBINDEX, TROPONINI in the last 168 hours.   BNP: Invalid input(s): POCBNP  CBG: Recent Labs  Lab 06/10/21 1721 06/10/21 1948 06/11/21 0035 06/11/21 0351 06/11/21 0753  GLUCAP 386* 286* 217* 160* 138*    Microbiology: Results for orders placed or performed during the hospital encounter of 05/31/21  Resp Panel by RT-PCR (Flu A&B, Covid) Nasopharyngeal Swab     Status: None   Collection Time: 05/31/21  6:58 AM    Specimen: Nasopharyngeal Swab; Nasopharyngeal(NP) swabs in vial transport medium  Result Value Ref Range Status   SARS Coronavirus 2 by RT PCR NEGATIVE NEGATIVE Final    Comment: (NOTE) SARS-CoV-2 target nucleic acids are NOT DETECTED.  The SARS-CoV-2 RNA is generally detectable in upper respiratory specimens during the acute phase of infection. The lowest concentration of SARS-CoV-2 viral copies this assay can detect is 138 copies/mL. A negative result does not preclude SARS-Cov-2 infection and should not be used as the sole basis for treatment or other patient management decisions. A negative result may occur with  improper specimen collection/handling, submission of specimen other than nasopharyngeal swab, presence of viral mutation(s) within the areas targeted by this assay, and inadequate number of viral copies(<138 copies/mL). A negative result must be combined with clinical observations, patient history, and epidemiological information. The expected result is Negative.  Fact Sheet for Patients:  EntrepreneurPulse.com.au  Fact Sheet for Healthcare Providers:  IncredibleEmployment.be  This test is no t yet approved or cleared by the [place] FDA and  has been authorized for detection and/or diagnosis of SARS-CoV-2 by FDA under an Emergency Use Authorization (EUA). This EUA will remain  in effect (meaning this test can be used) for the duration of the COVID-19 declaration under Section 564(b)(1) of the Act, 21 U.S.C.section 360bbb-3(b)(1), unless the authorization is terminated  or revoked sooner.       Influenza A by PCR NEGATIVE NEGATIVE Final   Influenza B by PCR NEGATIVE NEGATIVE Final    Comment: (NOTE) The Xpert Xpress SARS-CoV-2/FLU/RSV plus assay is intended as an aid in the diagnosis of influenza from Nasopharyngeal swab specimens and should not be used as a sole basis for treatment. Nasal washings and aspirates are  unacceptable for Xpert Xpress  SARS-CoV-2/FLU/RSV testing.  Fact Sheet for Patients: EntrepreneurPulse.com.au  Fact Sheet for Healthcare Providers: IncredibleEmployment.be  This test is not yet approved or cleared by the [place] FDA and has been authorized for detection and/or diagnosis of SARS-CoV-2 by FDA under an Emergency Use Authorization (EUA). This EUA will remain in effect (meaning this test can be used) for the duration of the COVID-19 declaration under Section 564(b)(1) of the Act, 21 U.S.C. section 360bbb-3(b)(1), unless the authorization is terminated or revoked.  Performed at [facility], [place], [place] [address]   Respiratory (~20 pathogens) panel by PCR     Status: None   Collection Time: 05/31/21  6:58 AM   Specimen: Nasopharyngeal Swab; Respiratory  Result Value Ref Range Status   Adenovirus NOT DETECTED NOT DETECTED Final   Coronavirus 229E NOT DETECTED NOT DETECTED Final    Comment: (NOTE) The Coronavirus on the Respiratory Panel, DOES NOT test for the novel  Coronavirus (2019 nCoV)    Coronavirus HKU1 NOT DETECTED NOT DETECTED Final   Coronavirus NL63 NOT DETECTED NOT DETECTED Final   Coronavirus OC43 NOT DETECTED NOT DETECTED Final   Metapneumovirus NOT DETECTED NOT DETECTED Final   Rhinovirus / Enterovirus NOT DETECTED NOT DETECTED Final   Influenza A NOT DETECTED NOT DETECTED Final   Influenza B NOT DETECTED NOT DETECTED Final   Parainfluenza Virus 1 NOT DETECTED NOT DETECTED Final   Parainfluenza Virus 2 NOT DETECTED NOT DETECTED Final   Parainfluenza Virus 3 NOT DETECTED NOT DETECTED Final   Parainfluenza Virus 4 NOT DETECTED NOT DETECTED Final   Respiratory Syncytial Virus NOT DETECTED NOT DETECTED Final   Bordetella pertussis NOT DETECTED NOT DETECTED Final   Bordetella Parapertussis NOT DETECTED NOT DETECTED Final   Chlamydophila pneumoniae NOT DETECTED NOT DETECTED Final    Mycoplasma pneumoniae NOT DETECTED NOT DETECTED Final    Comment: Performed at [facility], [place]. [address] [place], [place] [address]  Group A Strep by PCR [facility] Only)     Status: None   Collection Time: 05/31/21  8:35 AM   Specimen: Throat; Sterile Swab  Result Value Ref Range Status   Group A Strep by PCR NOT DETECTED NOT DETECTED Final    Comment: Performed at [facility], [address] [place], [place] [address]  Blood culture (routine x 2)     Status: None   Collection Time: 05/31/21  8:39 AM   Specimen: BLOOD  Result Value Ref Range Status   Specimen Description BLOOD BLOOD RIGHT FOREARM  Final   Special Requests   Final    BOTTLES DRAWN AEROBIC AND ANAEROBIC Blood Culture results may not be optimal due to an excessive volume of blood received in culture bottles   Culture   Final    NO GROWTH 5 DAYS Performed at [facility], [address] [place], [place] [address]    Report Status 06/05/2021 FINAL  Final  Blood culture (routine x 2)     Status: None   Collection Time: 05/31/21  8:39 AM   Specimen: BLOOD  Result Value Ref Range Status   Specimen Description BLOOD RIGHT ANTECUBITAL  Final   Special Requests   Final    BOTTLES DRAWN AEROBIC AND ANAEROBIC Blood Culture results may not be optimal due to an excessive volume of blood received in culture bottles   Culture   Final    NO GROWTH 5 DAYS Performed at [facility], [address] [place],  [place] [address]    Report Status 06/05/2021 FINAL  Final  C Difficile Quick Screen w PCR reflex     Status: None   Collection Time: 05/31/21  1:20 PM   Specimen: Stool  Result Value Ref Range Status   C Diff antigen NEGATIVE NEGATIVE Final   C Diff toxin NEGATIVE NEGATIVE Final   C Diff interpretation No C. difficile detected.  Final    Comment: Performed at [facility], [address] [place], [place] [address]  Gastrointestinal Panel by PCR , Stool     Status: None    Collection Time: 05/31/21  1:30 PM   Specimen: Stool  Result Value Ref Range Status   Campylobacter species NOT DETECTED NOT DETECTED Final   Plesimonas shigelloides NOT DETECTED NOT DETECTED Final   Salmonella species NOT DETECTED NOT DETECTED Final   Yersinia enterocolitica NOT DETECTED NOT DETECTED Final   Vibrio species NOT DETECTED NOT DETECTED Final   Vibrio cholerae NOT DETECTED NOT DETECTED Final   Enteroaggregative E coli (EAEC) NOT DETECTED NOT DETECTED Final   Enteropathogenic E coli (EPEC) NOT DETECTED NOT DETECTED Final   Enterotoxigenic E coli (ETEC) NOT DETECTED NOT DETECTED Final   Shiga like toxin producing E coli (STEC) NOT DETECTED NOT DETECTED Final   Shigella/Enteroinvasive E coli (EIEC) NOT DETECTED NOT DETECTED Final   Cryptosporidium NOT DETECTED NOT DETECTED Final   Cyclospora cayetanensis NOT DETECTED NOT DETECTED Final   Entamoeba histolytica NOT DETECTED NOT DETECTED Final   Giardia lamblia NOT DETECTED NOT DETECTED Final   Adenovirus F40/41 NOT DETECTED NOT DETECTED Final   Astrovirus NOT DETECTED NOT DETECTED Final   Norovirus GI/GII NOT DETECTED NOT DETECTED Final   Rotavirus A NOT DETECTED NOT DETECTED Final   Sapovirus (I, II, IV, and V) NOT DETECTED NOT DETECTED Final    Comment: Performed at [facility], [address], [place], [place] [address]  Urine Culture     Status: None   Collection Time: 06/01/21 10:23 AM   Specimen: In/Out Cath Urine  Result Value Ref Range Status   Specimen Description   Final    IN/OUT CATH URINE Performed at [facility], [address], [place], [place] [address]    Special Requests   Final    NONE Performed at [facility], [address] [place], [place] [address]    Culture   Final    NO GROWTH Performed at [facility], [address], [place], [place] [address]    Report Status 06/03/2021 FINAL  Final  Culture, Respiratory w Gram Stain     Status: None   Collection  Time: 06/01/21  3:59 PM   Specimen: Tracheal Aspirate; Respiratory  Result Value Ref Range Status   Specimen Description   Final    TRACHEAL ASPIRATE Performed at [facility], [address], [place], [place] [address]    Special Requests   Final    NONE Performed at [facility], [place], [place] [address]    Gram Stain   Final    RARE SQUAMOUS EPITHELIAL CELLS PRESENT MODERATE WBC PRESENT,BOTH PMN AND MONONUCLEAR MODERATE GRAM POSITIVE COCCI FEW GRAM NEGATIVE RODS FEW GRAM POSITIVE RODS RARE YEAST    Culture   Final    FEW Consistent with normal respiratory flora. No Pseudomonas species isolated Performed at [facility] [address], [place], [place] [address]    Report Status 06/03/2021 FINAL  Final  CULTURE, BLOOD (ROUTINE X 2) w Reflex to ID Panel     Status: None   Collection Time: 06/01/21  7:06 PM   Specimen: BLOOD  Result Value Ref Range Status   Specimen Description BLOOD RIGHT ANTECUBITAL  Final   Special Requests   Final    BOTTLES DRAWN AEROBIC ONLY Blood Culture results may not be optimal due to an inadequate volume of blood received in culture bottles   Culture   Final    NO GROWTH 5 DAYS Performed at [facility], [address] [place], [place] [address]    Report Status 06/06/2021 FINAL  Final  CULTURE, BLOOD (ROUTINE X 2) w Reflex to ID Panel     Status: None   Collection Time: 06/01/21  7:37 PM   Specimen: BLOOD  Result Value Ref Range Status   Specimen Description BLOOD [facility]  Final   Special Requests BOTTLES DRAWN AEROBIC AND ANAEROBIC BCLV  Final   Culture   Final    NO GROWTH 5 DAYS Performed at [facility], [address] [place], [place] [address]    Report Status 06/06/2021 FINAL  Final  MRSA Next Gen by PCR, Nasal     Status: None   Collection Time: 06/02/21 10:46 AM   Specimen: Nasal Mucosa; Nasal Swab  Result Value Ref Range Status   MRSA by PCR Next Gen NOT DETECTED  NOT DETECTED Final    Comment: (NOTE) The GeneXpert MRSA Assay (FDA approved for NASAL specimens only), is one component of a comprehensive MRSA colonization surveillance program. It is not intended to diagnose MRSA infection nor to guide or monitor treatment for MRSA infections. Test performance is not FDA approved in patients less than 59 years old. Performed at [facility], [address] [place], [place] [address]   Culture, Respiratory w Gram Stain     Status: None   Collection Time: 06/04/21  7:38 AM   Specimen: Tracheal Aspirate; Respiratory  Result Value Ref Range Status   Specimen Description   Final    TRACHEAL ASPIRATE Performed at [facility], [address] [place], [place] [address]    Special Requests   Final    NONE Performed at [facility], [address] [place], [place] [address]    Gram Stain   Final    MODERATE WBC PRESENT,BOTH PMN AND MONONUCLEAR NO ORGANISMS SEEN Performed at [facility], [place] [address] [place], [place] [address]    Culture FEW CANDIDA ALBICANS RARE ESCHERICHIA COLI   Final   Report Status 06/07/2021 FINAL  Final   Organism ID, Bacteria ESCHERICHIA COLI  Final      Susceptibility   Escherichia coli - MIC*    AMPICILLIN >=32 RESISTANT Resistant     CEFAZOLIN <=4 SENSITIVE Sensitive     CEFEPIME <=0.12 SENSITIVE Sensitive     CEFTAZIDIME <=1 SENSITIVE Sensitive     CEFTRIAXONE <=0.25 SENSITIVE Sensitive     CIPROFLOXACIN <=0.25 SENSITIVE Sensitive     GENTAMICIN <=1 SENSITIVE Sensitive     IMIPENEM <=0.25 SENSITIVE Sensitive     TRIMETH/SULFA >=320 RESISTANT Resistant     AMPICILLIN/SULBACTAM >=32 RESISTANT Resistant     PIP/TAZO <=4 SENSITIVE Sensitive     * RARE ESCHERICHIA COLI  CULTURE, BLOOD (ROUTINE X 2) w Reflex to ID Panel     Status: None   Collection Time: 06/04/21  6:35 PM   Specimen: BLOOD  Result Value Ref Range Status   Specimen  Description BLOOD BLOOD LEFT HAND  Final    Special Requests   Final    BOTTLES DRAWN AEROBIC AND ANAEROBIC Blood Culture adequate volume   Culture   Final    NO GROWTH 5 DAYS Performed at [facility], [address] [place], [place] [address]    Report Status 06/09/2021 FINAL  Final    Coagulation Studies: No results for input(s): LABPROT, INR in the last 72 hours.   Urinalysis: No results for input(s): COLORURINE, LABSPEC, PHURINE, GLUCOSEU, HGBUR, BILIRUBINUR, KETONESUR, PROTEINUR, UROBILINOGEN, NITRITE, LEUKOCYTESUR in the last 72 hours.  Invalid input(s): APPERANCEUR     Imaging: DG Abd 1 View  Result Date: 06/09/2021 CLINICAL DATA:  NG tube placement. EXAM: ABDOMEN - 1 VIEW COMPARISON:  KUB, most recently 06/09/2021. CT chest and pelvis, 06/03/2018. FINDINGS: Support lines: Enteric feeding tube, with tip and side port within stomach. Imaged bowel gas is nonobstructed. No radio-opaque calculi or other significant radiographic abnormality are seen. IMPRESSION: 1. Gastric placement of enteric feeding tube 2. Nonobstructed gas pattern of the imaged bowel. Electronically Signed   By: Michaelle Birks M.D.   On: 06/09/2021 10:27   DG Abd 1 View  Result Date: 06/09/2021 CLINICAL DATA:  Nasogastric tube placement EXAM: ABDOMEN - 1 VIEW COMPARISON:  06/04/2021 FINDINGS: Enteric tube descends below the diaphragm with the side port and tip overlying the stomach. This appears to be a different tube than on the prior 06/04/2021 radiograph. Nonobstructive bowel-gas pattern. No portal venous gas or pneumatosis. The lung bases are clear. No acute skeletal abnormality. IMPRESSION: Enteric tube in appropriate position. Electronically Signed   By: Yvonne Kendall M.D.   On: 06/09/2021 10:25     Medications:     ceFAZolin (ANCEF) IV     thiamine injection 500 mg (06/10/21 1134)    sodium chloride   Intravenous Once   amiodarone  200 mg Oral BID   chlorhexidine gluconate (MEDLINE KIT)  15 mL Mouth Rinse BID   Chlorhexidine  Gluconate Cloth  6 each Topical Daily   collagenase   Topical Daily   hydrocerin   Topical BID   hydrocortisone sod succinate (SOLU-CORTEF) inj  50 mg Intravenous Q6H   insulin aspart  3-9 Units Subcutaneous Q4H   lidocaine  1 patch Transdermal Q24H   mouth rinse  15 mL Mouth Rinse BID   metoCLOPramide (REGLAN) injection  5 mg Intravenous Q8H   midodrine  10 mg Oral TID WC   multivitamin  1 tablet Oral QHS   pantoprazole (PROTONIX) IV  40 mg Intravenous Q24H   sodium chloride flush  10-40 mL Intracatheter Q12H   [START ON 06/12/2021] thiamine injection  100 mg Intravenous Q24H   docusate, fentaNYL (SUBLIMAZE) injection, guaiFENesin-dextromethorphan, ipratropium-albuterol, lip balm, ondansetron **OR** ondansetron (ZOFRAN) IV, oxyCODONE, polyvinyl alcohol, sodium chloride flush  Assessment/ Plan:  [name] is a 26 y.o. black male with hypertension, insulin dependent diabetes mellitus type I, diabetic gastroparesis, diabetic neuropathy, tobacco use, THC use, right toe amputation, perirectal abscess, who is admitted to [facility] on 05/31/2021 for SIRS (systemic inflammatory response syndrome) ([place]) [R65.10] AKI (acute kidney injury) ([place]) [N17.9] Symptomatic anemia [D64.9] Acute renal failure superimposed on stage 5 chronic kidney disease, not on chronic dialysis, unspecified acute renal failure type ([place]) [N17.9, N18.5] Hypertension, unspecified type [I10] Acute cough [R05.1]  Cardiac arrest with code blue 06/01/2021 at 10 am. Transferred to ICU. Intubated  Acute kidney injury on chronic kidney disease stage IV versus progression of chronic kidney  disease to end stage renal disease.  Patient has baseline creatinine of 4.13, GFR of 19 on 08/26/20.  Patient has history of nephrotic range proteinuria and hematuria. No history of renal biopsy. Patient has strong family history of dialysis with mother with ESRD . Patient is in room hemodynamically better than yesterday Patient was earlier  requiring vasopressors Patient is now off vasopressors Patient CRRT was discontinued yesterday on June 10, 2021.  patient remains oliguric Patient urine output was only 100 mL Patient will most likely need renal placement therapy tomorrow Will follow closely for the need of renal placement therapy. Patient does now has a temporary femoral dialysis catheter       Acute respiratory failure Patient was extubated then re-intubated 06/03/2021; patient is now extubated 06/08/2021 Pulmonary team is following  Hypotension with cardiogenic shock  -Pt was requiring vasopressors now off .  Patient is hemodynamically fragile  4. Insulin dependent Diabetes type 1 with CKD and proteinuria   Lab Results  Component Value Date   HGBA1C 5.7 (H) 05/31/2021    5. Thrombocytopenia Patient platelet counts remain low Patient did receive platelet transfusion on July 25 Hematology and primary team is following   6.  Hypokalemia Being replete  Plan  Patient with critical medical issues of oliguric ATN, CKD stage IV, patient required CRRT, CRRT was held on January 28, patient will need intermittent dialysis, acute respiratory failure now extubated, hypotension with cardiogenic shock requiring vasopressors no hemodynamically fragile, diabetes mellitus type 1, thrombocytopenia, hypokalemia  Patient's urine output remains less than 400 mL in 24 hours time. Patient's creatinine has slightly been increasing since we discontinued patient's CRRT Patient will need renal placement therapy Will attempt intermittent hemodialysis tomorrow    LOS: 11 Neiko Trivedi s Malary A[place]worth 1/29/20238:43 AM

## 2021-06-11 NOTE — Consult Note (Addendum)
Jonathan Mcmillan for Electrolyte Monitoring and Replacement   Recent Labs: Potassium (mmol/L)  Date Value  06/11/2021 3.3 (L)  08/05/2013 3.8   Magnesium (mg/dL)  Date Value  06/11/2021 2.7 (H)   Calcium (mg/dL)  Date Value  06/11/2021 6.8 (L)   Calcium, Total (PTH) (mg/dL)  Date Value  06/12/2020 8.0   Albumin (g/dL)  Date Value  06/11/2021 1.8 (L)  08/05/2013 2.9 (L)   Phosphorus (mg/dL)  Date Value  06/11/2021 3.2   Sodium (mmol/L)  Date Value  06/11/2021 135  08/05/2013 133 (L)   Assessment: Jonathan Mcmillan is a 26 y.o. male with medical history including diabetes, CKD, HTN, nicotine dependence admitted on 05/31/2021 with  AKI on CKD, anemia, and fevers/chills/myalgias . Patient suffered cardiac arrest on the morning of 1/19 and was ultimately intubated and transferred to the ICU. CRRT discontinued per nephrology. Pharmacy has been consulted to monitor and replace electrolytes.   Goal of Therapy:  Electrolytes within normal limits  Plan:  --medical team ordered Kcl 20 mEq x 1.  --f/u with AM labs.   Jonathan Mcmillan, PharmD, BCPS Clinical Pharmacist 06/11/2021 7:32 AM

## 2021-06-11 NOTE — Progress Notes (Signed)
PHARMACY NOTE:  ANTIMICROBIAL RENAL DOSAGE ADJUSTMENT  Current antimicrobial regimen includes a mismatch between antimicrobial dosage and estimated renal function.  As per policy approved by the Pharmacy & Therapeutics and Medical Executive Committees, the antimicrobial dosage will be adjusted accordingly.  Current antimicrobial dosage: Cefazolin 2 g IV q12h  Indication: E coli pneumonia  Renal Function: CRRT discontinued at this point. Pending further RRT decision per nephrology    Antimicrobial dosage has been changed to:  Cefazolin 1 g IV q24h  Thank you for allowing pharmacy to be a part of this patient's care.  Benita Gutter, [facility] 06/11/2021 7:27 AM

## 2021-06-11 NOTE — Progress Notes (Addendum)
Rust-Chester, Huel Cote, NP notified of patient 7 beats of Vtach at 3:04AM. Ordered 20MEQ of Potassium. Pt is asymptomatic and Sinus Rhythm at this time.

## 2021-06-11 NOTE — Progress Notes (Addendum)
NAME:  Jonathan Mcmillan, MRN:  [id number], DOB:  09-24-1995, LOS: 6 ADMISSION DATE:  05/31/2021, CONSULTATION DATE:  06/01/2021 REFERRING MD:  Dr. Leslye Peer, CHIEF COMPLAINT:  Cardiac Arrest   Brief Pt Description / Synopsis:  26 y.o. admitted with AKI on CKD in setting of viral syndrome and diarrhea.  Suffered in-hospital cardiac arrest (initial rhythm asystole), suspect due to severe metabolic acidosis and multiple metabolic derangements.  History of Present Illness:  Jonathan Mcmillan is a 26 year old male with a past medical history significant for diabetes mellitus type 1, chronic kidney disease stage IV, hypertension, nicotine dependence who presented to [facility] ED on 05/31/2021 due to 3-day history of fever, chills, nonproductive cough, myalgias, and diarrhea. Patient denied urinary frequency, nocturia, dysuria, abdominal pain, hematemesis, chest pain, shortness of breath, edema. Patient was admitted by the hospitalist for further work-up and treatment of acute kidney injury on CKD.  Nephrology was consulted.  While on the floor patient coded and was admitted to the ICU under the PCCM service.  ED Course: Initial vital signs: Temperature 102.6, RR 22, HR 104, BP 139/70, SpO2 97% on room air Significant labs: Sodium 131, potassium 4.4, chloride 103, bicarb 15, glucose 177, BUN 19, creatinine 13.86 (baseline of 3.92), calcium 6.7, alkaline phosphatase 70, lipase 29, AST 24, ALT 33, lactic acid 2.1, WBC 12.7, hemoglobin 5.2 (baseline of 8.3) hematocrit 15.8, MCV 89.8, RDW 12.8, platelets 172, PT 15.8, INR 1.3. COVID-19 and influenza PCR negative Respiratory viral panel negative Urinalysis with proteinuria Imaging: Chest x-ray negative for acute cardiopulmonary disease CT Chest w/o contrast>>1. Trace pericardial effusion and mild diffuse body wall edema, new since 01/02/2020. 2. Clear lungs, with no focal consolidation or pleural effusion. 3. Hypodense blood pool consistent with anemia. Renal  US>>Increased renal echogenicity compatible with medical renal disease. Retroperitoneal perinephric edema suspect related to volume overload.  Interval History Early in the morning on 06/01/2021 rapid response was called when he was found in the shower on a chair passed out. Found to be  hypotensive, hypoglycemic, and lethargic with his "eyes rolling in the back of his head."  Patient was given 500 cc fluid bolus and ordered for blood transfusion.  However CODE BLUE was called as pt was found to be in asystole (progressed to PEA).  He received epinephrine x2, sodium bicarbonate x2, calcium x1, glucose and insulin.  Pulse was regained and he was transferred to ICU. Upon arrival to the ICU patient is critically ill and concern for decorticate posturing.  PCCM consulted for further management.   Pertinent  Medical History  Chronic Kidney Disease Stage IV Diabetes Mellitus 1  Micro Data:  05/31/2021: SARS-CoV-2 and influenza PCR>> negative 05/31/2021: Respiratory viral panel>> negative 05/31/2021: Group A strep PCR>> not detected 05/31/2021: Blood culture x2>> no growth to date 05/31/2021: GI panel>> negative 05/31/2021: C. Difficile>> negative 06/01/2021: Tracheal aspirate>> normal respiratory flora 06/01/2021: Urine>>no growth 06/01/2021: Blood culture>>no growth 06/02/2021: MRSA PCR >>negative 06/04/2021: Tracheal aspirate>> E. Coli (resistant to ampicillin, Unasyn, Bactrim) 06/04/2021: Blood culture>> NGTD 06/04/2021: Blood Fungus>>NegTD  Antimicrobials:  Ceftriaxone 1/19 x 1 dose Vancomycin 1/19>>1/21 Zosyn 1/19>>1/22 Flagyl 1/22>>1/23 Unasyn 1/23>>1/25 Cefepime 1/23 x1 dose, restarted 1/25>>1/26 Cefazolin 1/26>>  Significant Hospital Events: Including procedures, antibiotic start and stop dates in addition to other pertinent events   1/18: Admitted by the hospitalist.  Nephrology consulted, plans for vascular to place temporary dialysis catheter with initiation of dialysis 1/19:  In-hospital cardiac arrest.  Critically ill. Transferred to ICU.  Plan for CRRT.  Central  line, A line, and Trialysis catheter placed.  Concern for possible anoxic brain injury.  Sepsis workup in progress 1/20: Pt is awake, tracking, following simple commands. Remains on CRRT, bicarb gtt d/c.  Weaning fiO2 and pressors. Tracheal aspirate growing gram + cocci, gram + rods, gram - rods 1/21: Extubated; leukocytosis, thrombocytopenia 1/22: Reintubated overnight; apparent large volume aspiration; worsening shock 1/23 remains with multiorgan failure, plt transfusion, went into A. fib RVR, amiodarone started 1/24 severe multiorgan failure 1/26:  Plan to place Trialysis in IJ and remove femoral, transfuse 1 unit plt prior to procedure. transfuse 1 unit prbc (hgb 6.7).  Tolerating PSV, hopeful for liberation from vent following line placement.  ABX changed to Cefazolin due to Cx/S. EXTUBATED 1/27: Remains extubated, weaned to room air.  Weaning vasopressors.  Ordered for 2 units pRBC's due to Hgb 5.6. Connective tissue workup ordered 1/28: Coming off CRRT.  More interactive.  Swallow eval today passed to dysphagia 1 06/11/21: Patient is on day 3 post extubation.  He is alert and following commands.  He offers no complaints.  Blood glucose levels are still running high on resistant sliding scale.  ANA panel pending.  He passes swallow evaluation and is tolerating fluids.  Skin remains scarred, dry and flaky.  Potassium is low this morning..  Lipase is trending down.  Hemoglobin is stable between 7.7 and 8.1.  Patient is off CRRT and nephrology is following.  His blood pressure remained stable off pressors.  He offers no other complaints.  Interim History / Subjective:  -Extubated 1/26 evening, remains extubated, weaned to RA -Awake and alert, confused to situation -No significant events reported overnight -Pressors weaned off -Hgb 7.7 this am (8.3), received 2 units PRBCs 1/27 -New femoral trialysis placed on  right femoral vein 1/27 due to clotting of left IJ dialysis catheter -Remains on CRRT, plan is to transition off CRRT today -Leukocytosis improved to 22.7 (24.8) -WBC trending down and hemoglobin stable.  ANA panel pending  Objective   Blood pressure 124/86, pulse 63, temperature 98.2 F (36.8 C), temperature source Oral, resp. rate 14, height 5' 7.99" (1.727 m), weight 61.8 kg, SpO2 96 %.    FiO2 (%):  [21 %] 21 %   Intake/Output Summary (Last 24 hours) at 06/11/2021 0929 Last data filed at 06/11/2021 0831 Gross per 24 hour  Intake 1645.29 ml  Output 862 ml  Net 783.29 ml    Filed Weights   06/09/21 0436 06/10/21 0315 06/11/21 0424  Weight: 65.3 kg 61.6 kg 61.8 kg    Examination: General: Acutely ill-appearing male, Awake, on RA, in NAD Lungs: Rhonchi bilaterally, no wheezing,, even, nonlabored, no assessory muscle use Cardiovascular: regular rate and rhythm, no MRG Abdomen: Soft, nontender, nondistended, no guarding or rebound tenderness, +BS Extremities: 1+ pitting edema to shins bilaterally Neuro: Awake and alert, disoriented to situation, moving all extremities to command, nonf-ocal exam GU: Foley catheter in place with minimal urine output Skin: Thick, peeling, query sclerodactyly?  Resolved Hospital Problem list   Septic shock  Assessment & Plan:   Acute hypoxic respiratory failure in setting of Cardiac Arrest, Aspiration Pneumonia & New HFrEF -EXTUBATED 1/26; remains stable on supplemental oxygen -Supplemental O2 as needed to maintain O2 sats >92% -Follow intermittent Chest X-ray & ABG as needed -Prn Bronchodilators -Continue ABX as above -Volume removal with HD/CRRT -Ensure pulmonary hygiene -Aspiration precautions, patient passed to dysphagia 1 -Incentive spirometry as tolerated  Septic shock +/- Cardiogenic Shock New HFrEF: -Echocardiogram 06/01/21>>LVEF 35-40% and global hypokinesis,  grade II diastolic dysfunction, mildly reduced RV function, and moderately  increased Pulmonary systolic pressure In-Hospital-Cardiac Arrest due to severe metabolic acidosis and multiple metabolic derangements A. fib RVR 1/23 on amiodarone Mildly elevated troponin due to demand ischemia PMHx: Hypertension -Continuous cardiac monitoring -Maintain MAP >65 -Off pressors -Hemoglobin stable; will continue to trend and transfuse as needed -Continue Midodrine 10 mg 3 times daily -Stress dose steroids -HS Troponin peaked at 386; down to 116. -Last BNP 3692; will repeat BNP level in the morning -Transitioned to amiodarone to p.o.  Severe Sepsis due to Aspiration Pneumonia (E.coli) -Monitor fever curve -Trend WBC's & Procalcitonin -Follow cultures as above -Narrow to Cefazolin on 1/16 based on cultures & sensitivities  Anemia of Chronic Disease Thrombocytopenia (4T score is 3-4) in setting of Sepsis -Monitor for S/Sx of bleeding -Platelet count up to 35.  Continue to trend CBC -SCD's for VTE Prophylaxis  -Transfuse for Hgb <7 ~received 2 units pRBC's 1/27 -Transfuse Platelets for platelet count <10K, today's count 33K -Hematology following, appreciate input -Workup negative for DIC or HIT -Connective tissue disease work-up pending  AKI on CKD Stage IV -Monitor I&O's / urinary output -Follow BMP -Ensure adequate renal perfusion -Avoid nephrotoxic agents as able -Replace electrolytes as indicated -Nephrology following, appreciate input ~CRRT on hold.  Now on intermittent hemodialysis.  Continue HD per nephrology  PMHx: DM type I; now with hypoglycemia with blood sugars greater than 250 mg/dL -CBG's q4h; Target range of 140 to 180 -Continue resistant sliding scale and transition to insulin infusion if blood glucose levels remain persistently greater than 250 mg/dL -Follow ICU Hypo/Hyperglycemia protocol  Dysphagia stage I -Speech therapist following.  Patient is tolerating oral fluids.  Maintain on aspiration precautions.  Pt is critically ill with  multiorgan failure.  Prognosis is guarded, high risk for further cardiac arrest and death.   Best Practice (right click and "Reselect all SmartList Selections" daily)   Diet/type: Dysphagia 1 diet DVT prophylaxis: SCD (holding chemical prophylaxis due to thrombocytopenia) GI prophylaxis: PPI Lines: Central line and yes and it is still needed Foley:  Yes, and it is still needed Code Status:  full code Last date of multidisciplinary goals of care discussion [06/09/21]   Labs   CBC: Recent Labs  Lab 06/04/21 0936 06/04/21 1126 06/04/21 2308 06/05/21 0417 06/06/21 0423 06/07/21 0435 06/07/21 2004 06/08/21 0407 06/09/21 0411 06/09/21 1338 06/10/21 0500 06/11/21 0334  WBC 24.1*   < > 29.3* 30.3*   < > 32.6* 34.2* 31.7* 24.8*  --  22.7* 18.7*  NEUTROABS 19.3*  --  21.9* 26.2*  --  27.6* 26.5*  --   --   --   --   --   HGB 8.9*   < > 8.7* 8.3*   < > 7.8* 7.3* 6.7* 5.6* 8.3* 7.7* 8.1*  HCT 25.4*   < > 24.8* 23.8*   < > 22.7* 20.9* 18.5* 15.8* 23.2* 21.7* 23.9*  MCV 84.9   < > 86.1 86.9   < > 87.6 85.3 82.6 85.9  --  84.1 87.2  PLT 10*   < > 29* 15*   < > 9* 21* 17* 23*  --  33* 35*   < > = values in this interval not displayed.     Basic Metabolic Panel: Recent Labs  Lab 06/07/21 0435 06/07/21 1530 06/08/21 0407 06/08/21 1703 06/09/21 0411 06/09/21 1600 06/10/21 0500 06/11/21 0334  NA 136   135   < > 137   136 137 136  134* 139 134* 135  K 3.7   3.7   < > 3.6   3.5 3.9 3.8   3.8 4.1 3.8 3.3*  CL 105   104   < > 106   107 105 105   104 103 103 104  CO2 24   23   < > 22   22 23 25   25 24 24 23   GLUCOSE 196*   194*   < > 158*   157* 122* 92   93 167* 161* 164*  BUN 20   20   < > 28*   27* 33* 32*   32* 31* 36* 54*  CREATININE 1.05   0.99   < > 1.19   1.21 1.16 1.31*   1.24 1.34* 1.75* 2.30*  CALCIUM 7.2*   7.2*   < > 6.7*   6.6* 6.8* 6.7*   6.6* 7.3* 6.7* 6.8*  MG 2.5*  --  2.4  --  2.4  --  2.5* 2.7*  PHOS 1.9*   1.9*   < > 2.6   2.6 2.7 2.0*   2.1* 2.5 3.0 3.2   < >  = values in this interval not displayed.    GFR: Estimated Creatinine Clearance: 42.2 mL/min (A) (by C-G formula based on SCr of 2.3 mg/dL (H)). Recent Labs  Lab 06/04/21 0936 06/04/21 1126 06/05/21 0410 06/05/21 0417 06/06/21 0420 06/06/21 0423 06/08/21 0407 06/09/21 0411 06/10/21 0500 06/11/21 0334  PROCALCITON  --   --  49.08  --  39.56  --   --   --   --   --   WBC 24.1*   < >  --    < >  --    < > 31.7* 24.8* 22.7* 18.7*  LATICACIDVEN 3.3*  --   --   --   --   --   --   --   --   --    < > = values in this interval not displayed.     Liver Function Tests: Recent Labs  Lab 06/08/21 1703 06/09/21 0411 06/09/21 1600 06/10/21 0500 06/11/21 0334  AST  --  25  --   --   --   ALT  --  29  --   --   --   ALKPHOS  --  194*  --   --   --   BILITOT  --  2.7*  --   --   --   PROT  --  4.8*  --   --   --   ALBUMIN 1.6* 1.6*   1.7* 1.8* 1.7* 1.8*    Recent Labs  Lab 06/08/21 0407  LIPASE 16    No results for input(s): AMMONIA in the last 168 hours.   ABG    Component Value Date/Time   PHART 7.35 06/06/2021 1038   PCO2ART 46 06/06/2021 1038   PO2ART 124 (H) 06/06/2021 1038   HCO3 25.4 06/06/2021 1038   TCO2 22 06/24/2007 0045   ACIDBASEDEF 0.3 06/06/2021 1038   O2SAT 98.6 06/06/2021 1038      Coagulation Profile: No results for input(s): INR, PROTIME in the last 168 hours.   Cardiac Enzymes: No results for input(s): CKTOTAL, CKMB, CKMBINDEX, TROPONINI in the last 168 hours.   HbA1C: Hemoglobin-A1c  Date/Time Value Ref Range Status  06/26/2007 06:40 AM   Final   17.6% HIGH (NOTE) Reference Intervals:  Diabetic Adult        <9.0 Healthy  Adult         3.9 - 7.3 Current ADA guidelines recommend a treatment goal of <7.0% HgbA1c for diabetic patients, which corresponds to a <9.0% Glycohemoglobin result with this method.   Performed at:  [facility]               [place], [place]  [id number]   Hemoglobin A1C  Date/Time Value  Ref Range Status  08/05/2013 01:26 PM 12.9 (H) 4.2 - 6.3 % Final    Comment:    The American Diabetes Association recommends that a primary goal of therapy should be <7% and that physicians should reevaluate the treatment regimen in patients with HbA1c values consistently >8%.    Hgb A1c MFr Bld  Date/Time Value Ref Range Status  05/31/2021 12:05 PM 5.7 (H) 4.8 - 5.6 % Final    Comment:    RESULTS CONFIRMED BY MANUAL DILUTION REPEATED TO VERIFY (NOTE) Pre diabetes:          5.7%-6.4%  Diabetes:              >6.4%  Glycemic control for   <7.0% adults with diabetes   06/11/2020 03:41 AM 7.0 (H) 4.8 - 5.6 % Final    Comment:    (NOTE) Pre diabetes:          5.7%-6.4%  Diabetes:              >6.4%  Glycemic control for   <7.0% adults with diabetes     CBG: Recent Labs  Lab 06/10/21 1721 06/10/21 1948 06/11/21 0035 06/11/21 0351 06/11/21 0753  GLUCAP 386* 286* 217* 160* 138*     Review of Systems:   Positives in BOLD: Gen: Denies fever, chills, weight change, fatigue, night sweats HEENT: Denies blurred vision, double vision, hearing loss, tinnitus, sinus congestion, rhinorrhea, sore throat, neck stiffness, dysphagia PULM: Denies shortness of breath, cough, sputum production, hemoptysis, wheezing CV: Denies chest pain, edema, orthopnea, paroxysmal nocturnal dyspnea, palpitations GI: Denies abdominal pain, nausea, vomiting, diarrhea, hematochezia, melena, constipation, change in bowel habits GU: Denies dysuria, hematuria, polyuria, oliguria, urethral discharge Endocrine: Denies hot or cold intolerance, polyuria, polyphagia or appetite change Derm: dry skin, scaling or peeling skin change Heme: Denies easy bruising, bleeding, bleeding gums Neuro: Denies headache, numbness, weakness, slurred speech, loss of memory or consciousness   Allergies No Known Allergies   Medications  Scheduled Meds:  sodium chloride   Intravenous Once   amiodarone  200 mg Oral BID    chlorhexidine gluconate (MEDLINE KIT)  15 mL Mouth Rinse BID   Chlorhexidine Gluconate Cloth  6 each Topical Daily   collagenase   Topical Daily   hydrocerin   Topical BID   hydrocortisone sod succinate (SOLU-CORTEF) inj  50 mg Intravenous Q6H   insulin aspart  3-9 Units Subcutaneous Q4H   lidocaine  1 patch Transdermal Q24H   mouth rinse  15 mL Mouth Rinse BID   metoCLOPramide (REGLAN) injection  5 mg Intravenous Q8H   midodrine  10 mg Oral TID WC   multivitamin  1 tablet Oral QHS   pantoprazole (PROTONIX) IV  40 mg Intravenous Q24H   sodium chloride flush  10-40 mL Intracatheter Q12H   [START ON 06/12/2021] thiamine injection  100 mg Intravenous Q24H   Continuous Infusions:   ceFAZolin (ANCEF) IV     thiamine injection 500 mg (06/10/21 1134)   PRN Meds:.docusate,  fentaNYL (SUBLIMAZE) injection, guaiFENesin-dextromethorphan, ipratropium-albuterol, lip balm, ondansetron **OR** ondansetron (ZOFRAN) IV, oxyCODONE, polyvinyl alcohol, sodium chloride flush    Critical care time: 40 minutes    Patient seen and examined with Dr. Patsey Berthold.  Plan of care modified as above.  Shervon Kerwin S. Tukov ANP-BC Pulmonary and [place] Pager [phone] or [phone]   *This note was dictated using voice recognition software/Dragon.  Despite best efforts to proofread, errors can occur which can change the meaning. Any transcriptional errors that result from this process are unintentional and may not be fully corrected at the time of dictation.

## 2021-06-11 NOTE — Progress Notes (Signed)
SUBJECTIVE: Much more alert and oriented   Vitals:   06/11/21 0800 06/11/21 0900 06/11/21 1000 06/11/21 1100  BP: (!) 129/92 124/86 124/85 125/88  Pulse: 65 63 63 63  Resp: 13 14 18 11   Temp: 98.2 F (36.8 C)     TempSrc: Oral     SpO2: 96% 96% 94% 99%  Weight:      Height:        Intake/Output Summary (Last 24 hours) at 06/11/2021 1149 Last data filed at 06/11/2021 1000 Gross per 24 hour  Intake 1523.9 ml  Output 800 ml  Net 723.9 ml    LABS: Basic Metabolic Panel: Recent Labs    06/10/21 0500 06/11/21 0334  NA 134* 135  K 3.8 3.3*  CL 103 104  CO2 24 23  GLUCOSE 161* 164*  BUN 36* 54*  CREATININE 1.75* 2.30*  CALCIUM 6.7* 6.8*  MG 2.5* 2.7*  PHOS 3.0 3.2   Liver Function Tests: Recent Labs    06/09/21 0411 06/09/21 1600 06/10/21 0500 06/11/21 0334  AST 25  --   --   --   ALT 29  --   --   --   ALKPHOS 194*  --   --   --   BILITOT 2.7*  --   --   --   PROT 4.8*  --   --   --   ALBUMIN 1.6*   1.7*   < > 1.7* 1.8*   < > = values in this interval not displayed.   No results for input(s): LIPASE, AMYLASE in the last 72 hours. CBC: Recent Labs    06/10/21 0500 06/11/21 0334  WBC 22.7* 18.7*  HGB 7.7* 8.1*  HCT 21.7* 23.9*  MCV 84.1 87.2  PLT 33* 35*   Cardiac Enzymes: No results for input(s): CKTOTAL, CKMB, CKMBINDEX, TROPONINI in the last 72 hours. BNP: Invalid input(s): POCBNP D-Dimer: No results for input(s): DDIMER in the last 72 hours. Hemoglobin A1C: No results for input(s): HGBA1C in the last 72 hours. Fasting Lipid Panel: No results for input(s): CHOL, HDL, LDLCALC, TRIG, CHOLHDL, LDLDIRECT in the last 72 hours. Thyroid Function Tests: No results for input(s): TSH, T4TOTAL, T3FREE, THYROIDAB in the last 72 hours.  Invalid input(s): FREET3 Anemia Panel: Recent Labs    06/09/21 0411  RETICCTPCT 0.7     PHYSICAL EXAM General: Well developed, well nourished, in no acute distress HEENT:  Normocephalic and atramatic Neck:  No  JVD.  Lungs: Clear bilaterally to auscultation and percussion. Heart: HRRR . Normal S1 and S2 without gallops or murmurs.  Abdomen: Bowel sounds are positive, abdomen soft and non-tender  Msk:  Back normal, normal gait. Normal strength and tone for age. Extremities: No clubbing, cyanosis or edema.   Neuro: Alert and oriented X 3. Psych:  Good affect, responds appropriately  TELEMETRY: Sinus rhythm  ASSESSMENT AND PLAN: Status post atrial fibrillation with rapid ventricular response rate currently on p.o. amiodarone 400 twice daily advised changing back to 400 in few days and then 200 daily.  Status post cardiac arrest due to asystole now extubated and much more alert and oriented.  Principal Problem:   AKI (acute kidney injury) ([place]) Active Problems:   Hypertension   Acute kidney injury superimposed on CKD ([place])   Type 2 diabetes mellitus with hypoglycemia without coma, without long-term current use of insulin (HCC)   Nicotine dependence   Symptomatic anemia   Hypotension   Fever   Cardiopulmonary arrest (HCC)   Pressure  injury of skin   Acute respiratory failure with hypoxia (HCC)   Thrombocytopenia (HCC)    Mikkel Charrette A, MD, [facility] 06/11/2021 11:49 AM

## 2021-06-12 DIAGNOSIS — N186 End stage renal disease: Secondary | ICD-10-CM

## 2021-06-12 DIAGNOSIS — Z992 Dependence on renal dialysis: Secondary | ICD-10-CM

## 2021-06-12 DIAGNOSIS — D631 Anemia in chronic kidney disease: Secondary | ICD-10-CM

## 2021-06-12 LAB — COMPREHENSIVE METABOLIC PANEL
ALT: 6 U/L (ref 0–44)
AST: 16 U/L (ref 15–41)
Albumin: 1.7 g/dL — ABNORMAL LOW (ref 3.5–5.0)
Alkaline Phosphatase: 158 U/L — ABNORMAL HIGH (ref 38–126)
Anion gap: 8 (ref 5–15)
BUN: 69 mg/dL — ABNORMAL HIGH (ref 6–20)
CO2: 22 mmol/L (ref 22–32)
Calcium: 6.5 mg/dL — ABNORMAL LOW (ref 8.9–10.3)
Chloride: 104 mmol/L (ref 98–111)
Creatinine, Ser: 2.71 mg/dL — ABNORMAL HIGH (ref 0.61–1.24)
GFR, Estimated: 32 mL/min — ABNORMAL LOW (ref >60)
Glucose, Bld: 151 mg/dL — ABNORMAL HIGH (ref 70–99)
Potassium: 3.8 mmol/L (ref 3.5–5.1)
Sodium: 134 mmol/L — ABNORMAL LOW (ref 135–145)
Total Bilirubin: 1.3 mg/dL — ABNORMAL HIGH (ref 0.3–1.2)
Total Protein: 5 g/dL — ABNORMAL LOW (ref 6.5–8.1)

## 2021-06-12 LAB — CARDIOLIPIN ANTIBODIES, IGG, IGM, IGA
Anticardiolipin IgA: <9 APL U/mL (ref 0–11)
Anticardiolipin IgG: <9 GPL U/mL (ref 0–14)
Anticardiolipin IgM: <9 MPL U/mL (ref 0–12)

## 2021-06-12 LAB — GLUCOSE, CAPILLARY
Glucose-Capillary: 150 mg/dL — ABNORMAL HIGH (ref 70–99)
Glucose-Capillary: 147 mg/dL — ABNORMAL HIGH (ref 70–99)
Glucose-Capillary: 117 mg/dL — ABNORMAL HIGH (ref 70–99)
Glucose-Capillary: 143 mg/dL — ABNORMAL HIGH (ref 70–99)
Glucose-Capillary: 122 mg/dL — ABNORMAL HIGH (ref 70–99)
Glucose-Capillary: 83 mg/dL (ref 70–99)

## 2021-06-12 LAB — ANA COMPREHENSIVE PANEL

## 2021-06-12 LAB — ANCA PROFILE
Anti-MPO Antibodies: <0.2 units (ref 0.0–0.9)
Anti-PR3 Antibodies: <0.2 units (ref 0.0–0.9)
Atypical P-ANCA titer: <1:20 {titer}
C-ANCA: <1:20 {titer}
P-ANCA: <1:20 {titer}

## 2021-06-12 LAB — PHOSPHORUS: Phosphorus: 4.4 mg/dL (ref 2.5–4.6)

## 2021-06-12 LAB — CBC
HCT: 25.1 % — ABNORMAL LOW (ref 39.0–52.0)
Hemoglobin: 8.6 g/dL — ABNORMAL LOW (ref 13.0–17.0)
MCH: 29.4 pg (ref 26.0–34.0)
MCHC: 34.3 g/dL (ref 30.0–36.0)
MCV: 85.7 fL (ref 80.0–100.0)
Platelets: 38 10*3/uL — ABNORMAL LOW (ref 150–400)
RBC: 2.93 MIL/uL — ABNORMAL LOW (ref 4.22–5.81)
RDW: 16.1 % — ABNORMAL HIGH (ref 11.5–15.5)
WBC: 13.5 10*3/uL — ABNORMAL HIGH (ref 4.0–10.5)
nRBC: 0.1 % (ref 0.0–0.2)

## 2021-06-12 LAB — HEPATITIS B SURFACE ANTIGEN: Hepatitis B Surface Ag: NONREACTIVE

## 2021-06-12 LAB — HEPATITIS B SURFACE ANTIBODY,QUALITATIVE: Hep B S Ab: REACTIVE — AB

## 2021-06-12 LAB — SCLERODERMA DIAGNOSTIC PROFILE

## 2021-06-12 LAB — MAGNESIUM: Magnesium: 2.8 mg/dL — ABNORMAL HIGH (ref 1.7–2.4)

## 2021-06-12 MED ORDER — ZINC SULFATE 220 (50 ZN) MG PO CAPS
220.0000 mg | ORAL_CAPSULE | Freq: Every day | ORAL | Status: DC
  Administered 2021-06-12 – 2021-06-22 (×8): 220 mg via ORAL
  Filled 2021-06-12 (×9): qty 1

## 2021-06-12 MED ORDER — ALTEPLASE 2 MG IJ SOLR: 2.0000 mg | Freq: Once | INTRAMUSCULAR | Status: DC | PRN

## 2021-06-12 MED ORDER — HEPARIN SODIUM (PORCINE) 1000 UNIT/ML IJ SOLN
INTRAMUSCULAR | Status: AC
  Administered 2021-06-12: 2600 [IU] via INTRAVENOUS_CENTRAL
  Filled 2021-06-12: qty 10

## 2021-06-12 MED ORDER — SODIUM CHLORIDE 0.9 % IV SOLN: INTRAVENOUS | Status: DC | PRN

## 2021-06-12 MED ORDER — ASCORBIC ACID 500 MG PO TABS
500.0000 mg | ORAL_TABLET | Freq: Two times a day (BID) | ORAL | Status: DC
  Administered 2021-06-12 – 2021-06-22 (×17): 500 mg via ORAL
  Filled 2021-06-12 (×19): qty 1

## 2021-06-12 MED ORDER — NEPRO/CARBSTEADY PO LIQD
237.0000 mL | Freq: Two times a day (BID) | ORAL | Status: DC
  Administered 2021-06-13 – 2021-06-16 (×2): 237 mL via ORAL

## 2021-06-12 MED ORDER — PENTAFLUOROPROP-TETRAFLUOROETH EX AERO
1.0000 "application " | INHALATION_SPRAY | CUTANEOUS | Status: DC | PRN
  Filled 2021-06-12: qty 30

## 2021-06-12 MED ORDER — HEPARIN SODIUM (PORCINE) 1000 UNIT/ML DIALYSIS: 1000.0000 [IU] | INTRAMUSCULAR | Status: DC | PRN

## 2021-06-12 MED ORDER — SODIUM CHLORIDE 0.9 % IV SOLN: 100.0000 mL | INTRAVENOUS | Status: DC | PRN

## 2021-06-12 MED ORDER — LIDOCAINE HCL (PF) 1 % IJ SOLN
5.0000 mL | INTRAMUSCULAR | Status: DC | PRN
  Filled 2021-06-12: qty 5

## 2021-06-12 MED ORDER — LIDOCAINE-PRILOCAINE 2.5-2.5 % EX CREA
1.0000 "application " | TOPICAL_CREAM | CUTANEOUS | Status: DC | PRN
  Filled 2021-06-12: qty 5

## 2021-06-12 NOTE — Progress Notes (Signed)
NAME:  Jonathan Mcmillan, MRN:  [id number], DOB:  1996-04-11, LOS: 71 ADMISSION DATE:  05/31/2021, CONSULTATION DATE:  06/01/2021 REFERRING MD:  Dr. Leslye Peer, CHIEF COMPLAINT:  Cardiac Arrest   Brief Pt Description / Synopsis:  26 y.o. admitted with AKI on CKD in setting of viral syndrome and diarrhea.  Suffered in-hospital cardiac arrest (initial rhythm asystole), suspect due to severe metabolic acidosis and multiple metabolic derangements.  History of Present Illness:  Jonathan Mcmillan is a 26 year old male with a past medical history significant for diabetes mellitus type 1, chronic kidney disease stage IV, hypertension, nicotine dependence who presented to [facility] ED on 05/31/2021 due to 3-day history of fever, chills, nonproductive cough, myalgias, and diarrhea. Patient denied urinary frequency, nocturia, dysuria, abdominal pain, hematemesis, chest pain, shortness of breath, edema. Patient was admitted by the hospitalist for further work-up and treatment of acute kidney injury on CKD.  Nephrology was consulted.  While on the floor patient coded and was admitted to the ICU under the PCCM service.  ED Course: Initial vital signs: Temperature 102.6, RR 22, HR 104, BP 139/70, SpO2 97% on room air Significant labs: Sodium 131, potassium 4.4, chloride 103, bicarb 15, glucose 177, BUN 19, creatinine 13.86 (baseline of 3.92), calcium 6.7, alkaline phosphatase 70, lipase 29, AST 24, ALT 33, lactic acid 2.1, WBC 12.7, hemoglobin 5.2 (baseline of 8.3) hematocrit 15.8, MCV 89.8, RDW 12.8, platelets 172, PT 15.8, INR 1.3. COVID-19 and influenza PCR negative Respiratory viral panel negative Urinalysis with proteinuria Imaging: Chest x-ray negative for acute cardiopulmonary disease CT Chest w/o contrast>>1. Trace pericardial effusion and mild diffuse body wall edema, new since 01/02/2020. 2. Clear lungs, with no focal consolidation or pleural effusion. 3. Hypodense blood pool consistent with anemia. Renal  US>>Increased renal echogenicity compatible with medical renal disease. Retroperitoneal perinephric edema suspect related to volume overload.  Interval History Early in the morning on 06/01/2021 rapid response was called when he was found in the shower on a chair passed out. Found to be  hypotensive, hypoglycemic, and lethargic with his "eyes rolling in the back of his head."  Patient was given 500 cc fluid bolus and ordered for blood transfusion.  However CODE BLUE was called as pt was found to be in asystole (progressed to PEA).  He received epinephrine x2, sodium bicarbonate x2, calcium x1, glucose and insulin.  Pulse was regained and he was transferred to ICU. Upon arrival to the ICU patient is critically ill and concern for decorticate posturing.  PCCM consulted for further management.   Pertinent  Medical History  Chronic Kidney Disease Stage IV Diabetes Mellitus 1  Micro Data:  05/31/2021: SARS-CoV-2 and influenza PCR>> negative 05/31/2021: Respiratory viral panel>> negative 05/31/2021: Group A strep PCR>> not detected 05/31/2021: Blood culture x2>> no growth to date 05/31/2021: GI panel>> negative 05/31/2021: C. Difficile>> negative 06/01/2021: Tracheal aspirate>> normal respiratory flora 06/01/2021: Urine>>no growth 06/01/2021: Blood culture>>no growth 06/02/2021: MRSA PCR >>negative 06/04/2021: Tracheal aspirate>> E. Coli (resistant to ampicillin, Unasyn, Bactrim) 06/04/2021: Blood culture>> NGTD 06/04/2021: Blood Fungus>>NegTD  Antimicrobials:  Ceftriaxone 1/19 x 1 dose Vancomycin 1/19>>1/21 Zosyn 1/19>>1/22 Flagyl 1/22>>1/23 Unasyn 1/23>>1/25 Cefepime 1/23 x1 dose, restarted 1/25>>1/26 Cefazolin 1/26>>  Significant Hospital Events: Including procedures, antibiotic start and stop dates in addition to other pertinent events   1/18: Admitted by the hospitalist.  Nephrology consulted, plans for vascular to place temporary dialysis catheter with initiation of dialysis 1/19:  In-hospital cardiac arrest.  Critically ill. Transferred to ICU.  Plan for CRRT.  Central  line, A line, and Trialysis catheter placed.  Concern for possible anoxic brain injury.  Sepsis workup in progress 1/20: Pt is awake, tracking, following simple commands. Remains on CRRT, bicarb gtt d/c.  Weaning fiO2 and pressors. Tracheal aspirate growing gram + cocci, gram + rods, gram - rods 1/21: Extubated; leukocytosis, thrombocytopenia 1/22: Reintubated overnight; apparent large volume aspiration; worsening shock 1/23 remains with multiorgan failure, plt transfusion, went into A. fib RVR, amiodarone started 1/24 severe multiorgan failure 1/26:  Plan to place Trialysis in IJ and remove femoral, transfuse 1 unit plt prior to procedure. transfuse 1 unit prbc (hgb 6.7).  Tolerating PSV, hopeful for liberation from vent following line placement.  ABX changed to Cefazolin due to Cx/S. EXTUBATED 1/27: Remains extubated, weaned to room air.  Weaning vasopressors.  Ordered for 2 units pRBC's due to Hgb 5.6. Connective tissue workup ordered 1/28: Coming off CRRT.  More interactive.  Swallow eval today passed to dysphagia 1 06/11/21: Patient is on day 3 post extubation.  He is alert and following commands.  He offers no complaints.  Blood glucose levels are still running high on resistant sliding scale.  ANA panel pending.  He passes swallow evaluation and is tolerating fluids.  Skin remains scarred, dry and flaky.  Potassium is low this morning..  Lipase is trending down.  Hemoglobin is stable between 7.7 and 8.1.  Patient is off CRRT and nephrology is following.  His blood pressure remained stable off pressors.  He offers no other complaints.  Interim History / Subjective:  -Extubated 1/26 evening weaned to RA Alert and awake NAD No oxygen No pressors -No significant events reported overnight -Hgb 7.7 this am (8.3), received 2 units PRBCs 1/27 -New femoral trialysis placed on right femoral vein 1/27 due to  clotting of left IJ dialysis catheter Now SD status  Objective   Blood pressure 113/79, pulse 85, temperature (!) 97.5 F (36.4 C), temperature source Oral, resp. rate 19, height 5' 7.99" (1.727 m), weight 64.7 kg, SpO2 100 %.    FiO2 (%):  [21 %] 21 %   Intake/Output Summary (Last 24 hours) at 06/12/2021 1042 Last data filed at 06/12/2021 0700 Gross per 24 hour  Intake 620 ml  Output 100 ml  Net 520 ml    Filed Weights   06/10/21 0315 06/11/21 0424 06/12/21 0404  Weight: 61.6 kg 61.8 kg 64.7 kg    Review of Systems:  Gen:  Denies  fever, sweats, chills weight loss  HEENT: Denies blurred vision, double vision, ear pain, eye pain, hearing loss, nose bleeds, sore throat Cardiac:  No dizziness, chest pain or heaviness, chest tightness,edema, No JVD Resp:   No cough, -sputum production, -shortness of breath,-wheezing, -hemoptysis,  Other:  All other systems negative  Examination: General: alert and awake Lungs: CTA b/L  Cardiovascular: regular rate and rhythm,  Abdomen: Soft, nontender, nondistended, no guarding or rebound tenderness, +BS Extremities: 1+ pitting edema to shins bilaterally Neuro: Awake and alert   Resolved Hospital Problem list   Septic shock  Assessment & Plan:   Acute hypoxic respiratory failure in setting of Cardiac Arrest, Aspiration Pneumonia & New HFrEF RESOLVED -EXTUBATED 1/26; remains stable on supplemental oxygen -Supplemental O2 as needed to maintain O2 sats >92% -Follow intermittent Chest X-ray & ABG as needed -Prn Bronchodilators -Continue ABX as above -Volume removal with HD/CRRT -Ensure pulmonary hygiene -Aspiration precautions, patient passed to dysphagia 1 -Incentive spirometry as tolerated   Septic shock +/- Cardiogenic Shock-RESOLVED New HFrEF: -Echocardiogram 06/01/21>>LVEF 35-40%  and global hypokinesis, grade II diastolic dysfunction, mildly reduced RV function, and moderately increased Pulmonary systolic  pressure In-Hospital-Cardiac Arrest due to severe metabolic acidosis and multiple metabolic derangements A. fib RVR 1/23 on amiodarone Mildly elevated troponin due to demand ischemia PMHx: Hypertension Extubated Supportive care Follow up cardiology recs  Severe Sepsis due to Aspiration Pneumonia (E.coli) resolved  Anemia of Chronic Disease Thrombocytopenia (4T score is 3-4) in setting of Sepsis Follow up CBC as needed  AKI on CKD Stage IV -Monitor I&O's / urinary output -Follow BMP -Ensure adequate renal perfusion -Avoid nephrotoxic agents as able -Replace electrolytes as indicated -Nephrology following, appreciate input ~CRRT on hold.  Now on intermittent hemodialysis.  Continue HD per nephrology  PMHx: DM type I; now with hypoglycemia with blood sugars greater than 250 mg/dL -CBG's q4h; Target range of 140 to 180 -Continue resistant sliding scale and transition to insulin infusion if blood glucose levels remain persistently greater than 250 mg/dL -Follow ICU Hypo/Hyperglycemia protocol    Best Practice (right click and "Reselect all SmartList Selections" daily)   Diet/type: Dysphagia 1 diet DVT prophylaxis: SCD (holding chemical prophylaxis due to thrombocytopenia) GI prophylaxis: PPI Lines: Central line and yes and it is still needed Foley:  Yes, and it is still needed Code Status:  full code Last date of multidisciplinary goals of care discussion [06/09/21]   Labs   CBC: Recent Labs  Lab 06/07/21 0435 06/07/21 2004 06/08/21 0407 06/09/21 0411 06/09/21 1338 06/10/21 0500 06/11/21 0334 06/12/21 0339  WBC 32.6* 34.2* 31.7* 24.8*  --  22.7* 18.7* 13.5*  NEUTROABS 27.6* 26.5*  --   --   --   --   --   --   HGB 7.8* 7.3* 6.7* 5.6* 8.3* 7.7* 8.1* 8.6*  HCT 22.7* 20.9* 18.5* 15.8* 23.2* 21.7* 23.9* 25.1*  MCV 87.6 85.3 82.6 85.9  --  84.1 87.2 85.7  PLT 9* 21* 17* 23*  --  33* 35* 38*     Basic Metabolic Panel: Recent Labs  Lab 06/08/21 0407 06/08/21 1703  06/09/21 0411 06/09/21 1600 06/10/21 0500 06/11/21 0334 06/12/21 0339  NA 137   136   < > 136   134* 139 134* 135 134*  K 3.6   3.5   < > 3.8   3.8 4.1 3.8 3.3* 3.8  CL 106   107   < > 105   104 103 103 104 104  CO2 22   22   < > 25   25 24 24 23 22   GLUCOSE 158*   157*   < > 92   93 167* 161* 164* 151*  BUN 28*   27*   < > 32*   32* 31* 36* 54* 69*  CREATININE 1.19   1.21   < > 1.31*   1.24 1.34* 1.75* 2.30* 2.71*  CALCIUM 6.7*   6.6*   < > 6.7*   6.6* 7.3* 6.7* 6.8* 6.5*  MG 2.4  --  2.4  --  2.5* 2.7* 2.8*  PHOS 2.6   2.6   < > 2.0*   2.1* 2.5 3.0 3.2 4.4   < > = values in this interval not displayed.    GFR: Estimated Creatinine Clearance: 37.5 mL/min (A) (by C-G formula based on SCr of 2.71 mg/dL (H)). Recent Labs  Lab 06/06/21 0420 06/06/21 0423 06/09/21 0411 06/10/21 0500 06/11/21 0334 06/12/21 0339  PROCALCITON 39.56  --   --   --   --   --  WBC  --    < > 24.8* 22.7* 18.7* 13.5*   < > = values in this interval not displayed.     Liver Function Tests: Recent Labs  Lab 06/09/21 0411 06/09/21 1600 06/10/21 0500 06/11/21 0334 06/12/21 0339  AST 25  --   --   --  16  ALT 29  --   --   --  6  ALKPHOS 194*  --   --   --  158*  BILITOT 2.7*  --   --   --  1.3*  PROT 4.8*  --   --   --  5.0*  ALBUMIN 1.6*   1.7* 1.8* 1.7* 1.8* 1.7*    Recent Labs  Lab 06/08/21 0407  LIPASE 16    No results for input(s): AMMONIA in the last 168 hours.   ABG    Component Value Date/Time   PHART 7.35 06/06/2021 1038   PCO2ART 46 06/06/2021 1038   PO2ART 124 (H) 06/06/2021 1038   HCO3 25.4 06/06/2021 1038   TCO2 22 06/24/2007 0045   ACIDBASEDEF 0.3 06/06/2021 1038   O2SAT 98.6 06/06/2021 1038      HbA1C: Hemoglobin-A1c  Date/Time Value Ref Range Status  06/26/2007 06:40 AM   Final   17.6% HIGH (NOTE) Reference Intervals:  Diabetic Adult        <9.0 Healthy Adult         3.9 - 7.3 Current ADA guidelines recommend a treatment goal of <7.0% HgbA1c for diabetic  patients, which corresponds to a <9.0% Glycohemoglobin result with this method.   Performed at:  [facility]               [place], [place]  [id number]   Hemoglobin A1C  Date/Time Value Ref Range Status  08/05/2013 01:26 PM 12.9 (H) 4.2 - 6.3 % Final    Comment:    The American Diabetes Association recommends that a primary goal of therapy should be <7% and that physicians should reevaluate the treatment regimen in patients with HbA1c values consistently >8%.    Hgb A1c MFr Bld  Date/Time Value Ref Range Status  05/31/2021 12:05 PM 5.7 (H) 4.8 - 5.6 % Final    Comment:    RESULTS CONFIRMED BY MANUAL DILUTION REPEATED TO VERIFY (NOTE) Pre diabetes:          5.7%-6.4%  Diabetes:              >6.4%  Glycemic control for   <7.0% adults with diabetes   06/11/2020 03:41 AM 7.0 (H) 4.8 - 5.6 % Final    Comment:    (NOTE) Pre diabetes:          5.7%-6.4%  Diabetes:              >6.4%  Glycemic control for   <7.0% adults with diabetes     Patient is now [place] [facility]   Maretta Bees Patricia Pesa, M.D.  [facility]  Medical Director [place] Director [facility] Cardio-Pulmonary Department

## 2021-06-12 NOTE — Progress Notes (Signed)
Physical Therapy Treatment Patient Details Name: Jonathan Mcmillan MRN: [id number] DOB: March 04, 1996 Today's Date: 06/12/2021   History of Present Illness Pt is a 26 y/o M admitted on 06/01/21 with AKI on CKD in setting of viral syndrome & diarrhea. Pt suffered in-hospital cardiac arrest, suspect due to severe metabolic acidosis & multiple metabolic derangements. Required CRRT and now preparing to undergo regular HD with femoral catheter placed on 1/28. Pt was extubated on 1/21 but reintubated 1/22. Extubated again on 1/26 & now weaned to room air. PMH: DM2, CKD stage IV, HTN, nicotine dependence.    PT Comments    Pt seen for PT tx with pt received semi fowler in bed. Pt continues to have R temporary dialysis catheter in & because of this pt is not allowed to get OOB so pt performs BLE strengthening exercises. Pt demonstrates significant BLE weakness & requires effort to perform 10 repetitions of each exercise. Will continue to follow pt acutely to progress mobility as able & when pt cleared for mobility 2/2 dialysis catheter.    Recommendations for follow up therapy are one component of a multi-disciplinary discharge planning process, led by the attending physician.  Recommendations may be updated based on patient status, additional functional criteria and insurance authorization.  Follow Up Recommendations  Acute inpatient rehab (3hours/day)     Assistance Recommended at Discharge Frequent or constant Supervision/Assistance  Patient can return home with the following Two people to help with walking and/or transfers;Two people to help with bathing/dressing/bathroom;Direct supervision/assist for medications management;Help with stairs or ramp for entrance;Assistance with feeding;Assist for transportation;Assistance with cooking/housework;Direct supervision/assist for financial management   Equipment Recommendations  None recommended by PT    Recommendations for Other Services        Precautions / Restrictions Precautions Precautions: Fall Precaution Comments: R temporary femoral dialysis catheter, per Dr. Ulice Bold, not to get OOB with therapy at this time. Restrictions Weight Bearing Restrictions: No     Mobility  Bed Mobility                    Transfers                        Ambulation/Gait                   Stairs             Wheelchair Mobility    Modified Rankin (Stroke Patients Only)       Balance                                            Cognition Arousal/Alertness: Awake/alert Behavior During Therapy: Flat affect Overall Cognitive Status: Within Functional Limits for tasks assessed                                 General Comments: Pt is oriented x4, appropriate, but slow to respond/slow to process.        Exercises General Exercises - Lower Extremity Ankle Circles/Pumps:  (despite multimodal cuing pt unable to properly coordinate/perform exercise) Quad Sets: AROM, Strengthening, Both, 10 reps, Supine Short Arc Quad: AAROM, Left, 10 reps, Supine, Strengthening, Right Heel Slides: AAROM, Strengthening, Left, 10 reps, Supine Hip ABduction/ADduction: AAROM, Strengthening, Left, 10 reps, Supine Straight Leg Raises: AAROM,  Supine, Left, 10 reps, Strengthening    General Comments        Pertinent Vitals/Pain Pain Assessment Pain Assessment: No/denies pain    Home Living                          Prior Function            PT Goals (current goals can now be found in the care plan section) Acute Rehab PT Goals Patient Stated Goal: none stated PT Goal Formulation: With patient Time For Goal Achievement: 06/24/21 Potential to Achieve Goals: Good Progress towards PT goals: Progressing toward goals    Frequency    Min 2X/week      PT Plan Current plan remains appropriate    Co-evaluation              AM-PAC PT "6 Clicks"  Mobility   Outcome Measure  Help needed turning from your back to your side while in a flat bed without using bedrails?: A Lot Help needed moving from lying on your back to sitting on the side of a flat bed without using bedrails?: Total Help needed moving to and from a bed to a chair (including a wheelchair)?: Total Help needed standing up from a chair using your arms (e.g., wheelchair or bedside chair)?: Total Help needed to walk in hospital room?: Total Help needed climbing 3-5 steps with a railing? : Total 6 Click Score: 7    End of Session   Activity Tolerance: Patient tolerated treatment well Patient left: in bed;with bed alarm set;with call bell/phone within reach Nurse Communication: Mobility status PT Visit Diagnosis: Difficulty in walking, not elsewhere classified (R26.2);Muscle weakness (generalized) (M62.81)     Time: 8882-8003 PT Time Calculation (min) (ACUTE ONLY): 9 min  Charges:  $Therapeutic Exercise: 8-22 mins                     Lavone Nian, PT, DPT 06/12/21, 1:21 PM    Waunita Schooner 06/12/2021, 1:18 PM

## 2021-06-12 NOTE — Progress Notes (Signed)
°   06/12/21 1100  °Clinical Encounter Type  °Visited With Patient  °Visit Type Follow-up  ° °Chaplain introduced himself to patient. Chaplain has visited family a number of times while patient while was intubated but now met him for first time when he was cognizant.  °

## 2021-06-12 NOTE — Progress Notes (Signed)
[facility]  ROUNDING NOTE   Subjective:   Patient still quite weak. Not making much urine.  Due for HD treatment today.   Objective:  Vital signs in last 24 hours:  Temp:  [96.4 F (35.8 C)-98.6 F (37 C)] 97.5 F (36.4 C) (01/30 0800) Pulse Rate:  [56-85] 85 (01/30 1000) Resp:  [9-25] 19 (01/30 1000) BP: (107-155)/(69-101) 113/79 (01/30 1000) SpO2:  [96 %-100 %] 100 % (01/30 1000) FiO2 (%):  [21 %] 21 % (01/29 1800) Weight:  [64.7 kg] 64.7 kg (01/30 0404)  Weight change: 2.9 kg Filed Weights   06/10/21 0315 06/11/21 0424 06/12/21 0404  Weight: 61.6 kg 61.8 kg 64.7 kg    Intake/Output: I/O last 3 completed shifts: In: 1224.7 [P.O.:1010; I.V.:14.7; IV Piggyback:200] Out: 200 [Urine:100; Stool:100]   Intake/Output this shift:  No intake/output data recorded.  Physical Exam: General: Critically ill appearing  Eyes: Anicteric  Lungs:  Scattered rhonchi, normal effort  Heart: S1S2, no rubs  Abdomen:  Soft, NTND, BS present  Extremities: Trace peripheral edema.  Neurologic: Awake, alert, no rbus  Skin: No acute lesions  Access:  Left IJ temporary dialysis catheter  Rectal tube, Foley in place  Basic Metabolic Panel: Recent Labs  Lab 06/08/21 0407 06/08/21 1703 06/09/21 0411 06/09/21 1600 06/10/21 0500 06/11/21 0334 06/12/21 0339  NA 137   136   < > 136   134* 139 134* 135 134*  K 3.6   3.5   < > 3.8   3.8 4.1 3.8 3.3* 3.8  CL 106   107   < > 105   104 103 103 104 104  CO2 22   22   < > 25   25 24 24 23 22   GLUCOSE 158*   157*   < > 92   93 167* 161* 164* 151*  BUN 28*   27*   < > 32*   32* 31* 36* 54* 69*  CREATININE 1.19   1.21   < > 1.31*   1.24 1.34* 1.75* 2.30* 2.71*  CALCIUM 6.7*   6.6*   < > 6.7*   6.6* 7.3* 6.7* 6.8* 6.5*  MG 2.4  --  2.4  --  2.5* 2.7* 2.8*  PHOS 2.6   2.6   < > 2.0*   2.1* 2.5 3.0 3.2 4.4   < > = values in this interval not displayed.     Liver Function Tests: Recent Labs  Lab 06/09/21 0411 06/09/21 1600  06/10/21 0500 06/11/21 0334 06/12/21 0339  AST 25  --   --   --  16  ALT 29  --   --   --  6  ALKPHOS 194*  --   --   --  158*  BILITOT 2.7*  --   --   --  1.3*  PROT 4.8*  --   --   --  5.0*  ALBUMIN 1.6*   1.7* 1.8* 1.7* 1.8* 1.7*    Recent Labs  Lab 06/08/21 0407  LIPASE 16    No results for input(s): AMMONIA in the last 168 hours.   CBC: Recent Labs  Lab 06/07/21 0435 06/07/21 2004 06/08/21 0407 06/09/21 0411 06/09/21 1338 06/10/21 0500 06/11/21 0334 06/12/21 0339  WBC 32.6* 34.2* 31.7* 24.8*  --  22.7* 18.7* 13.5*  NEUTROABS 27.6* 26.5*  --   --   --   --   --   --   HGB 7.8* 7.3* 6.7* 5.6* 8.3*  7.7* 8.1* 8.6*  HCT 22.7* 20.9* 18.5* 15.8* 23.2* 21.7* 23.9* 25.1*  MCV 87.6 85.3 82.6 85.9  --  84.1 87.2 85.7  PLT 9* 21* 17* 23*  --  33* 35* 38*     Cardiac Enzymes: No results for input(s): CKTOTAL, CKMB, CKMBINDEX, TROPONINI in the last 168 hours.   BNP: Invalid input(s): POCBNP  CBG: Recent Labs  Lab 06/11/21 1525 06/11/21 2037 06/12/21 0105 06/12/21 0326 06/12/21 0710  GLUCAP 213* 87 150* 147* 117*     Microbiology: Results for orders placed or performed during the hospital encounter of 05/31/21  Resp Panel by RT-PCR (Flu A&B, Covid) Nasopharyngeal Swab     Status: None   Collection Time: 05/31/21  6:58 AM   Specimen: Nasopharyngeal Swab; Nasopharyngeal(NP) swabs in vial transport medium  Result Value Ref Range Status   SARS Coronavirus 2 by RT PCR NEGATIVE NEGATIVE Final    Comment: (NOTE) SARS-CoV-2 target nucleic acids are NOT DETECTED.  The SARS-CoV-2 RNA is generally detectable in upper respiratory specimens during the acute phase of infection. The lowest concentration of SARS-CoV-2 viral copies this assay can detect is 138 copies/mL. A negative result does not preclude SARS-Cov-2 infection and should not be used as the sole basis for treatment or other patient management decisions. A negative result may occur with  improper  specimen collection/handling, submission of specimen other than nasopharyngeal swab, presence of viral mutation(s) within the areas targeted by this assay, and inadequate number of viral copies(<138 copies/mL). A negative result must be combined with clinical observations, patient history, and epidemiological information. The expected result is Negative.  Fact Sheet for Patients:  EntrepreneurPulse.com.au  Fact Sheet for Healthcare Providers:  IncredibleEmployment.be  This test is no t yet approved or cleared by the [place] FDA and  has been authorized for detection and/or diagnosis of SARS-CoV-2 by FDA under an Emergency Use Authorization (EUA). This EUA will remain  in effect (meaning this test can be used) for the duration of the COVID-19 declaration under Section 564(b)(1) of the Act, 21 U.S.C.section 360bbb-3(b)(1), unless the authorization is terminated  or revoked sooner.       Influenza A by PCR NEGATIVE NEGATIVE Final   Influenza B by PCR NEGATIVE NEGATIVE Final    Comment: (NOTE) The Xpert Xpress SARS-CoV-2/FLU/RSV plus assay is intended as an aid in the diagnosis of influenza from Nasopharyngeal swab specimens and should not be used as a sole basis for treatment. Nasal washings and aspirates are unacceptable for Xpert Xpress SARS-CoV-2/FLU/RSV testing.  Fact Sheet for Patients: EntrepreneurPulse.com.au  Fact Sheet for Healthcare Providers: IncredibleEmployment.be  This test is not yet approved or cleared by the [place] FDA and has been authorized for detection and/or diagnosis of SARS-CoV-2 by FDA under an Emergency Use Authorization (EUA). This EUA will remain in effect (meaning this test can be used) for the duration of the COVID-19 declaration under Section 564(b)(1) of the Act, 21 U.S.C. section 360bbb-3(b)(1), unless the authorization is terminated or revoked.  Performed at  [facility], [place], [place] [address]   Respiratory (~20 pathogens) panel by PCR     Status: None   Collection Time: 05/31/21  6:58 AM   Specimen: Nasopharyngeal Swab; Respiratory  Result Value Ref Range Status   Adenovirus NOT DETECTED NOT DETECTED Final   Coronavirus 229E NOT DETECTED NOT DETECTED Final    Comment: (NOTE) The Coronavirus on the Respiratory Panel, DOES NOT test for the novel  Coronavirus (2019 nCoV)  Coronavirus HKU1 NOT DETECTED NOT DETECTED Final   Coronavirus NL63 NOT DETECTED NOT DETECTED Final   Coronavirus OC43 NOT DETECTED NOT DETECTED Final   Metapneumovirus NOT DETECTED NOT DETECTED Final   Rhinovirus / Enterovirus NOT DETECTED NOT DETECTED Final   Influenza A NOT DETECTED NOT DETECTED Final   Influenza B NOT DETECTED NOT DETECTED Final   Parainfluenza Virus 1 NOT DETECTED NOT DETECTED Final   Parainfluenza Virus 2 NOT DETECTED NOT DETECTED Final   Parainfluenza Virus 3 NOT DETECTED NOT DETECTED Final   Parainfluenza Virus 4 NOT DETECTED NOT DETECTED Final   Respiratory Syncytial Virus NOT DETECTED NOT DETECTED Final   Bordetella pertussis NOT DETECTED NOT DETECTED Final   Bordetella Parapertussis NOT DETECTED NOT DETECTED Final   Chlamydophila pneumoniae NOT DETECTED NOT DETECTED Final   Mycoplasma pneumoniae NOT DETECTED NOT DETECTED Final    Comment: Performed at [facility], [place] [address] [place], [place] [address]  Group A Strep by PCR [facility] Only)     Status: None   Collection Time: 05/31/21  8:35 AM   Specimen: Throat; Sterile Swab  Result Value Ref Range Status   Group A Strep by PCR NOT DETECTED NOT DETECTED Final    Comment: Performed at [facility], [address] [place], [place] [address]  Blood culture (routine x 2)     Status: None   Collection Time: 05/31/21  8:39 AM   Specimen: BLOOD  Result Value Ref Range Status   Specimen Description BLOOD BLOOD RIGHT FOREARM  Final   Special  Requests   Final    BOTTLES DRAWN AEROBIC AND ANAEROBIC Blood Culture results may not be optimal due to an excessive volume of blood received in culture bottles   Culture   Final    NO GROWTH 5 DAYS Performed at [facility], [address] [place], [place] [address]    Report Status 06/05/2021 FINAL  Final  Blood culture (routine x 2)     Status: None   Collection Time: 05/31/21  8:39 AM   Specimen: BLOOD  Result Value Ref Range Status   Specimen Description BLOOD RIGHT ANTECUBITAL  Final   Special Requests   Final    BOTTLES DRAWN AEROBIC AND ANAEROBIC Blood Culture results may not be optimal due to an excessive volume of blood received in culture bottles   Culture   Final    NO GROWTH 5 DAYS Performed at [facility], [address], [place], [place] [address]    Report Status 06/05/2021 FINAL  Final  C Difficile Quick Screen w PCR reflex     Status: None   Collection Time: 05/31/21  1:20 PM   Specimen: Stool  Result Value Ref Range Status   C Diff antigen NEGATIVE NEGATIVE Final   C Diff toxin NEGATIVE NEGATIVE Final   C Diff interpretation No C. difficile detected.  Final    Comment: Performed at [facility], [address] [place], [place] [address]  Gastrointestinal Panel by PCR , Stool     Status: None   Collection Time: 05/31/21  1:30 PM   Specimen: Stool  Result Value Ref Range Status   Campylobacter species NOT DETECTED NOT DETECTED Final   Plesimonas shigelloides NOT DETECTED NOT DETECTED Final   Salmonella species NOT DETECTED NOT DETECTED Final   Yersinia enterocolitica NOT DETECTED NOT DETECTED Final   Vibrio species NOT DETECTED NOT DETECTED Final   Vibrio cholerae NOT DETECTED NOT DETECTED Final  Enteroaggregative E coli (EAEC) NOT DETECTED NOT DETECTED Final   Enteropathogenic E coli (EPEC) NOT DETECTED NOT DETECTED Final   Enterotoxigenic E coli (ETEC) NOT DETECTED NOT DETECTED Final   Shiga like toxin producing E coli  (STEC) NOT DETECTED NOT DETECTED Final   Shigella/Enteroinvasive E coli (EIEC) NOT DETECTED NOT DETECTED Final   Cryptosporidium NOT DETECTED NOT DETECTED Final   Cyclospora cayetanensis NOT DETECTED NOT DETECTED Final   Entamoeba histolytica NOT DETECTED NOT DETECTED Final   Giardia lamblia NOT DETECTED NOT DETECTED Final   Adenovirus F40/41 NOT DETECTED NOT DETECTED Final   Astrovirus NOT DETECTED NOT DETECTED Final   Norovirus GI/GII NOT DETECTED NOT DETECTED Final   Rotavirus A NOT DETECTED NOT DETECTED Final   Sapovirus (I, II, IV, and V) NOT DETECTED NOT DETECTED Final    Comment: Performed at [facility], [address], [place], [place] [address]  Urine Culture     Status: None   Collection Time: 06/01/21 10:23 AM   Specimen: In/Out Cath Urine  Result Value Ref Range Status   Specimen Description   Final    IN/OUT CATH URINE Performed at [facility], [address] [place], [place] [address]    Special Requests   Final    NONE Performed at [facility], [address] [place], [place] [address]    Culture   Final    NO GROWTH Performed at [facility], [address], [place], [place] [address]    Report Status 06/03/2021 FINAL  Final  Culture, Respiratory w Gram Stain     Status: None   Collection Time: 06/01/21  3:59 PM   Specimen: Tracheal Aspirate; Respiratory  Result Value Ref Range Status   Specimen Description   Final    TRACHEAL ASPIRATE Performed at [facility], [address] [place], [place] [address]    Special Requests   Final    NONE Performed at [facility], [place], [place] [address]    Gram Stain   Final    RARE SQUAMOUS EPITHELIAL CELLS PRESENT MODERATE WBC PRESENT,BOTH PMN AND MONONUCLEAR MODERATE GRAM POSITIVE COCCI FEW GRAM NEGATIVE RODS FEW GRAM POSITIVE RODS RARE YEAST    Culture   Final    FEW Consistent with normal respiratory flora. No Pseudomonas species  isolated Performed at [place] [address] [place], [place] [address]    Report Status 06/03/2021 FINAL  Final  CULTURE, BLOOD (ROUTINE X 2) w Reflex to ID Panel     Status: None   Collection Time: 06/01/21  7:06 PM   Specimen: BLOOD  Result Value Ref Range Status   Specimen Description BLOOD RIGHT ANTECUBITAL  Final   Special Requests   Final    BOTTLES DRAWN AEROBIC ONLY Blood Culture results may not be optimal due to an inadequate volume of blood received in culture bottles   Culture   Final    NO GROWTH 5 DAYS Performed at [facility], [address] [place], [place] [address]    Report Status 06/06/2021 FINAL  Final  CULTURE, BLOOD (ROUTINE X 2) w Reflex to ID Panel     Status: None   Collection Time: 06/01/21  7:37 PM   Specimen: BLOOD  Result Value Ref Range Status   Specimen Description BLOOD [facility]  Final   Special Requests BOTTLES DRAWN AEROBIC AND ANAEROBIC BCLV  Final   Culture   Final  NO GROWTH 5 DAYS Performed at [facility], [address] [place], [place] [address]    Report Status 06/06/2021 FINAL  Final  MRSA Next Gen by PCR, Nasal     Status: None   Collection Time: 06/02/21 10:46 AM   Specimen: Nasal Mucosa; Nasal Swab  Result Value Ref Range Status   MRSA by PCR Next Gen NOT DETECTED NOT DETECTED Final    Comment: (NOTE) The GeneXpert MRSA Assay (FDA approved for NASAL specimens only), is one component of a comprehensive MRSA colonization surveillance program. It is not intended to diagnose MRSA infection nor to guide or monitor treatment for MRSA infections. Test performance is not FDA approved in patients less than 67 years old. Performed at [facility], [address] [place], [place] [address]   Culture, Respiratory w Gram Stain     Status: None   Collection Time: 06/04/21  7:38 AM   Specimen: Tracheal Aspirate; Respiratory  Result Value Ref Range Status   Specimen Description   Final    TRACHEAL  ASPIRATE Performed at [facility], [address], [place], [place] [address]    Special Requests   Final    NONE Performed at [facility], [address] [place], [name] [address]    Gram Stain   Final    MODERATE WBC PRESENT,BOTH PMN AND MONONUCLEAR NO ORGANISMS SEEN Performed at [facility], [place] [address] [place],  [address]    Culture FEW CANDIDA ALBICANS RARE ESCHERICHIA COLI   Final   Report Status 06/07/2021 FINAL  Final   Organism ID, Bacteria ESCHERICHIA COLI  Final      Susceptibility   Escherichia coli - MIC*    AMPICILLIN >=32 RESISTANT Resistant     CEFAZOLIN <=4 SENSITIVE Sensitive     CEFEPIME <=0.12 SENSITIVE Sensitive     CEFTAZIDIME <=1 SENSITIVE Sensitive     CEFTRIAXONE <=0.25 SENSITIVE Sensitive     CIPROFLOXACIN <=0.25 SENSITIVE Sensitive     GENTAMICIN <=1 SENSITIVE Sensitive     IMIPENEM <=0.25 SENSITIVE Sensitive     TRIMETH/SULFA >=320 RESISTANT Resistant     AMPICILLIN/SULBACTAM >=32 RESISTANT Resistant     PIP/TAZO <=4 SENSITIVE Sensitive     * RARE ESCHERICHIA COLI  CULTURE, BLOOD (ROUTINE X 2) w Reflex to ID Panel     Status: None   Collection Time: 06/04/21  6:35 PM   Specimen: BLOOD  Result Value Ref Range Status   Specimen Description BLOOD BLOOD LEFT HAND  Final   Special Requests   Final    BOTTLES DRAWN AEROBIC AND ANAEROBIC Blood Culture adequate volume   Culture   Final    NO GROWTH 5 DAYS Performed at [facility], [address], [place],  [address]    Report Status 06/09/2021 FINAL  Final    Coagulation Studies: No results for input(s): LABPROT, INR in the last 72 hours.   Urinalysis: No results for input(s): COLORURINE, LABSPEC, PHURINE, GLUCOSEU, HGBUR, BILIRUBINUR, KETONESUR, PROTEINUR, UROBILINOGEN, NITRITE, LEUKOCYTESUR in the last 72 hours.  Invalid input(s): APPERANCEUR     Imaging: No results found.   Medications:     ceFAZolin (ANCEF) IV Stopped  (06/11/21 2300)    sodium chloride   Intravenous Once   amiodarone  200 mg Oral BID   chlorhexidine gluconate (MEDLINE KIT)  15 mL Mouth Rinse BID   Chlorhexidine Gluconate Cloth  6 each Topical Daily   Chlorhexidine Gluconate Cloth  6 each Topical  Q0600   collagenase   Topical Daily   hydrocerin   Topical BID   hydrocortisone sod succinate (SOLU-CORTEF) inj  50 mg Intravenous Q12H   insulin aspart  3-9 Units Subcutaneous Q4H   insulin glargine-yfgn  10 Units Subcutaneous QHS   mouth rinse  15 mL Mouth Rinse BID   multivitamin  1 tablet Oral QHS   pantoprazole  40 mg Oral Daily   sodium chloride flush  10-40 mL Intracatheter Q12H   thiamine injection  100 mg Intravenous Q24H   docusate, fentaNYL (SUBLIMAZE) injection, guaiFENesin-dextromethorphan, ipratropium-albuterol, lip balm, ondansetron **OR** ondansetron (ZOFRAN) IV, oxyCODONE, polyvinyl alcohol, sodium chloride flush  Assessment/ Plan:  [name] is a 26 y.o. black male with hypertension, insulin dependent diabetes mellitus type I, diabetic gastroparesis, diabetic neuropathy, tobacco use, THC use, right toe amputation, perirectal abscess, who is admitted to [facility] on 05/31/2021 for SIRS (systemic inflammatory response syndrome) ([place]) [R65.10] AKI (acute kidney injury) ([place]) [N17.9] Symptomatic anemia [D64.9] Acute renal failure superimposed on stage 5 chronic kidney disease, not on chronic dialysis, unspecified acute renal failure type ([place]) [N17.9, N18.5] Hypertension, unspecified type [I10] Acute cough [R05.1]  Cardiac arrest with code blue 06/01/2021 at 10 am. Transferred to ICU. Intubated  Acute kidney injury on chronic kidney disease stage IV versus progression of chronic kidney disease to end stage renal disease. Baseline creatinine of 4.13, GFR of 19 on 08/26/20. History of nephrotic range proteinuria and hematuria. No history of renal biopsy. Strong family history of dialysis with mother with ESRD before passing. Was  on CRRT.  - Pt remains oliguric at this time, will plan on HD today.     Acute respiratory failure Extubated then re-intubated 06/03/2021; extubated 06/08/2021 -  Respiratory status stable at the moment.   Hypotension with cardiogenic shock - continue hydrocortisone.   4. Insulin dependent Diabetes type 1 with CKD and proteinuria   Lab Results  Component Value Date   HGBA1C 5.7 (H) 05/31/2021    5. Thrombocytopenia Hematology evaluation on 1/22 Suspected peripheral destruction Work up in progress Got platelet infusion on January 25.    LOS: 12 Cap Massi 1/30/202310:22 AM

## 2021-06-12 NOTE — Progress Notes (Signed)
Hematology/Oncology Progress Note  [phone] [phone]  Patient Care Team: [facility], [facility] as PCP - General (General Practice)   Name of the patient: Jonathan Mcmillan  [id number]  Mar 01, 1996  Date of visit: 06/12/21   INTERVAL HISTORY-  06/09/21 Extubated.  Reports + weakness    Review of systems- Review of Systems  Unable to perform ROS: Acuity of condition  Constitutional:  Positive for fatigue.   No Known Allergies  Patient Active Problem List   Diagnosis Date Noted   Thrombocytopenia ([place])    Acute respiratory failure with hypoxia (HCC)    Pressure injury of skin 06/01/2021   Hypotension    Fever    Cardiopulmonary arrest ([place])    Type 2 diabetes mellitus with hypoglycemia without coma, without long-term current use of insulin ([place]) 05/31/2021   Nicotine dependence 05/31/2021   Symptomatic anemia 05/31/2021   Acute kidney injury superimposed on CKD ([place]) 06/11/2020   Nausea    Polysubstance abuse ([place])    AKI (acute kidney injury) ([place]) 06/10/2020   Abdominal pain 09/09/2018   Right arm cellulitis 01/26/2018   Sepsis ([place]) 03/06/2017   Malnutrition of moderate degree 05/22/2016   DKA (diabetic ketoacidoses) 05/21/2016   DM (diabetes mellitus), 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[place]) 02/72/5366   Hypertension 10/05/2010   Obesity 10/05/2010     Past Medical History:  Diagnosis Date   Cannabinoid hyperemesis syndrome    CKD (chronic kidney disease), stage IV (HCC)    Diabetes 1.5, managed as type 1 ([place])    Gastroparesis    HTN (hypertension)    Nicotine dependence    Perirectal abscess 06/16/2017     Past Surgical History:  Procedure Laterality Date   INCISION AND DRAINAGE PERIRECTAL ABSCESS N/A 06/16/2017   Procedure: IRRIGATION AND DEBRIDEMENT PERIRECTAL ABSCESS;  Surgeon: Florene Glen, MD;  Location: [facility] ORS;  Service: General;  Laterality: N/A;   none      Social History   Socioeconomic History    Marital status: Single    Spouse name: Not on file   Number of children: Not on file   Years of education: Not on file   Highest education level: Not on file  Occupational History   Occupation: unemployed  Tobacco Use   Smoking status: Every Day    Packs/day: 0.50    Types: Cigarettes   Smokeless tobacco: Never  Vaping Use   Vaping Use: Never used  Substance and Sexual Activity   Alcohol use: No   Drug use: Yes    Types: Marijuana   Sexual activity: Not on file  Other Topics Concern   Not on file  Social History Narrative   Not on file   Social Determinants of Health   Financial Resource Strain: Not on file  Food Insecurity: Not on file  Transportation Needs: Not on file  Physical Activity: Not on file  Stress: Not on file  Social Connections: Not on file  Intimate Partner Violence: Not on file     Family History  Problem Relation Age of Onset   Diabetes Mother      Current Facility-Administered Medications:    0.9 %  sodium chloride infusion (Manually program via Guardrails IV Fluids), , Intravenous, Once, Tyler Pita, MD, Held at 06/09/21 0838   0.9 %  sodium chloride infusion, 100 mL, Intravenous, PRN, Bhutani, Manpreet S, MD   0.9 %  sodium chloride infusion, 100 mL, Intravenous, PRN, Bhutani, Lavina Hamman, MD   alteplase (  CATHFLO ACTIVASE) injection 2 mg, 2 mg, Intracatheter, Once PRN, Bhutani, Manpreet S, MD   amiodarone (PACERONE) tablet 200 mg, 200 mg, Oral, BID, Rust-Chester, Britton L, NP, 200 mg at 06/12/21 9326   ascorbic acid (VITAMIN C) tablet 500 mg, 500 mg, Oral, BID, Kasa, Kurian, MD, 500 mg at 06/12/21 1300   ceFAZolin (ANCEF) IVPB 1 g/50 mL premix, 1 g, Intravenous, Q24H, Benita Gutter, RPH, Stopped at 06/11/21 2300   chlorhexidine gluconate (MEDLINE KIT) (PERIDEX) 0.12 % solution 15 mL, 15 mL, Mouth Rinse, BID, Jonnie Finner, Michele Mcalpine, MD, 15 mL at 06/11/21 0803   Chlorhexidine Gluconate Cloth 2 % PADS 6 each, 6 each, Topical, Daily, Loletha Grayer, MD, 6 each at 06/11/21 0906   Chlorhexidine Gluconate Cloth 2 % PADS 6 each, 6 each, Topical, Q0600, Liana Gerold, MD, 6 each at 06/12/21 7124   collagenase (SANTYL) ointment, , Topical, Daily, Tyler Pita, MD, Given at 06/12/21 0959   docusate (COLACE) 50 MG/5ML liquid 100 mg, 100 mg, Oral, BID PRN, Rust-Chester, Toribio Harbour L, NP   feeding supplement (NEPRO CARB STEADY) liquid 237 mL, 237 mL, Oral, BID BM, Kasa, Kurian, MD   fentaNYL (SUBLIMAZE) injection 50 mcg, 50 mcg, Intravenous, Q2H PRN, Lang Snow, NP, 50 mcg at 06/11/21 0254   guaiFENesin-dextromethorphan (ROBITUSSIN DM) 100-10 MG/5ML syrup 10 mL, 10 mL, Oral, Q6H PRN, Rust-Chester, Britton L, NP   heparin injection 1,000 Units, 1,000 Units, Dialysis, PRN, Bhutani, Manpreet S, MD   heparin sodium (porcine) 1000 UNIT/ML injection, , , ,    hydrocerin (EUCERIN) cream, , Topical, BID, Tyler Pita, MD, Given at 06/12/21 [phone]   hydrocortisone sodium succinate (SOLU-CORTEF) 100 MG injection 50 mg, 50 mg, Intravenous, Q12H, Tyler Pita, MD, 50 mg at 06/12/21 1411   insulin aspart (novoLOG) injection 3-9 Units, 3-9 Units, Subcutaneous, Q4H, Tyler Pita, MD, 3 Units at 06/12/21 1929   insulin glargine-yfgn (SEMGLEE) injection 10 Units, 10 Units, Subcutaneous, QHS, Tukov-Yual, Magdalene S, NP, 10 Units at 06/11/21 2214   ipratropium-albuterol (DUONEB) 0.5-2.5 (3) MG/3ML nebulizer solution 3 mL, 3 mL, Nebulization, Q6H PRN, Lang Snow, NP, 3 mL at 06/10/21 0808   lidocaine (PF) (XYLOCAINE) 1 % injection 5 mL, 5 mL, Intradermal, PRN, Bhutani, Manpreet S, MD   lidocaine-prilocaine (EMLA) cream 1 application, 1 application, Topical, PRN, Bhutani, Manpreet S, MD   lip balm (BLISTEX) ointment, , Topical, PRN, Lang Snow, NP, 1 application at 98/33/82 1829   MEDLINE mouth rinse, 15 mL, Mouth Rinse, BID, Kasa, Kurian, MD, 15 mL at 06/12/21 0959   multivitamin (RENA-VIT) tablet 1  tablet, 1 tablet, Oral, QHS, Rust-Chester, Britton L, NP, 1 tablet at 06/11/21 2215   ondansetron (ZOFRAN) tablet 4 mg, 4 mg, Oral, Q6H PRN **OR** ondansetron (ZOFRAN) injection 4 mg, 4 mg, Intravenous, Q6H PRN, Agbata, Tochukwu, MD, 4 mg at 06/11/21 0255   oxyCODONE (Oxy IR/ROXICODONE) immediate release tablet 5 mg, 5 mg, Oral, Q4H PRN, Rust-Chester, Britton L, NP, 5 mg at 06/12/21 0144   pantoprazole (PROTONIX) EC tablet 40 mg, 40 mg, Oral, Daily, Tyler Pita, MD, 40 mg at 06/12/21 5053   pentafluoroprop-tetrafluoroeth (GEBAUERS) aerosol 1 application, 1 application, Topical, PRN, Bhutani, Manpreet S, MD   polyvinyl alcohol (LIQUIFILM TEARS) 1.4 % ophthalmic solution, , Both Eyes, QHS PRN, Lang Snow, NP, Given at 06/12/21 1117   sodium chloride flush (NS) 0.9 % injection 10-40 mL, 10-40 mL, Intracatheter, Q12H, Kasa, Kurian, MD, 10 mL at 06/12/21 1000  sodium chloride flush (NS) 0.9 % injection 10-40 mL, 10-40 mL, Intracatheter, PRN, Mortimer Fries, Kurian, MD, 10 mL at 06/11/21 0908   [COMPLETED] thiamine 561m in normal saline (553m IVPB, 500 mg, Intravenous, Q24H, Stopped at 06/11/21 1203 **FOLLOWED BY** thiamine (B-1) injection 100 mg, 100 mg, Intravenous, Q24H, GoVernard Gambles, MD, 100 mg at 06/12/21 1412   zinc sulfate capsule 220 mg, 220 mg, Oral, Daily, Kasa, KuMaretta BeesMD, 220 mg at 06/12/21 1300   Physical exam:  Vitals:   06/12/21 1845 06/12/21 1900 06/12/21 1915 06/12/21 1930  BP: 113/82 114/78 112/76 114/83  Pulse: 64 65 66 70  Resp: 14 10 (!) 23 14  Temp: (!) 94.2 F (34.6 C)     TempSrc: Oral     SpO2: 98% 100% 100% 100%  Weight:      Height:       Physical Exam HENT:     Head: Normocephalic.  Abdominal:     Palpations: Abdomen is soft.  Musculoskeletal:     Cervical back: Neck supple.     Right lower leg: No edema.     Left lower leg: No edema.  Skin:    General: Skin is warm.  Neurological:     Mental Status: He is alert.     Comments: Following  commands.        CMP Latest Ref Rng & Units 06/12/2021  Glucose 70 - 99 mg/dL 151(H)  BUN 6 - 20 mg/dL 69(H)  Creatinine 0.61 - 1.24 mg/dL 2.71(H)  Sodium 135 - 145 mmol/L 134(L)  Potassium 3.5 - 5.1 mmol/L 3.8  Chloride 98 - 111 mmol/L 104  CO2 22 - 32 mmol/L 22  Calcium 8.9 - 10.3 mg/dL 6.5(L)  Total Protein 6.5 - 8.1 g/dL 5.0(L)  Total Bilirubin 0.3 - 1.2 mg/dL 1.3(H)  Alkaline Phos 38 - 126 U/L 158(H)  AST 15 - 41 U/L 16  ALT 0 - 44 U/L 6   CBC Latest Ref Rng & Units 06/12/2021  WBC 4.0 - 10.5 K/uL 13.5(H)  Hemoglobin 13.0 - 17.0 g/dL 8.6(L)  Hematocrit 39.0 - 52.0 % 25.1(L)  Platelets 150 - 400 K/uL 38(L)    RADIOGRAPHIC STUDIES: I have personally reviewed the radiological images as listed and agreed with the findings in the report. DG Chest 1 View  Result Date: 06/03/2021 CLINICAL DATA:  Post intubation EXAM: CHEST  1 VIEW COMPARISON:  06/02/2021, 05/31/2021, 03/08/2020 FINDINGS: Tip of the endotracheal tube is about 3.3 cm superior to carina. Cardiomegaly with bilateral effusions and worsened basilar airspace disease. Vascular congestion. Esophageal tube has been removed. IMPRESSION: 1. Endotracheal tube tip about 3.3 cm superior to carina. 2. Cardiomegaly with interim development of bilateral effusions and basilar airspace disease. Vascular congestion and mild edema. Overall aeration is worse as compared with 06/02/2021. Electronically Signed   By: KiDonavan Foil.D.   On: 06/03/2021 23:24   DG Chest 2 View  Result Date: 05/31/2021 CLINICAL DATA:  Cough.  Fever. EXAM: CHEST - 2 VIEW COMPARISON:  Multiple chest XRs, most recently 03/08/2020. CT chest, 01/02/2020. FINDINGS: Cardiomediastinal silhouette is within normal limits given positioning and technique. Lungs are well inflated. No focal consolidation or mass. No pleural effusion or pneumothorax. No acute displaced fracture. IMPRESSION: No active cardiopulmonary disease. Electronically Signed   By: JoMichaelle Birks.D.   On:  05/31/2021 07:34   DG Abd 1 View  Result Date: 06/09/2021 CLINICAL DATA:  NG tube placement. EXAM: ABDOMEN - 1 VIEW COMPARISON:  KUB, most recently  06/09/2021. CT chest and pelvis, 06/03/2018. FINDINGS: Support lines: Enteric feeding tube, with tip and side port within stomach. Imaged bowel gas is nonobstructed. No radio-opaque calculi or other significant radiographic abnormality are seen. IMPRESSION: 1. Gastric placement of enteric feeding tube 2. Nonobstructed gas pattern of the imaged bowel. Electronically Signed   By: Michaelle Birks M.D.   On: 06/09/2021 10:27   DG Abd 1 View  Result Date: 06/09/2021 CLINICAL DATA:  Nasogastric tube placement EXAM: ABDOMEN - 1 VIEW COMPARISON:  06/04/2021 FINDINGS: Enteric tube descends below the diaphragm with the side port and tip overlying the stomach. This appears to be a different tube than on the prior 06/04/2021 radiograph. Nonobstructive bowel-gas pattern. No portal venous gas or pneumatosis. The lung bases are clear. No acute skeletal abnormality. IMPRESSION: Enteric tube in appropriate position. Electronically Signed   By: Yvonne Kendall M.D.   On: 06/09/2021 10:25   DG Abd 1 View  Result Date: 06/04/2021 CLINICAL DATA:  OG tube. EXAM: ABDOMEN - 1 VIEW COMPARISON:  CT CAP, 06/03/2021.  KUB, 06/03/2021 and 06/01/2021. FINDINGS: Support lines: Enteric tube with tip and side port within stomach. RIGHT femoral dialysis catheter, incompletely imaged. Overlying pacer leads. The bowel gas pattern is normal.  No interval osseous abnormality. IMPRESSION: 1. Well-positioned enteric feeding tube, with tip and side port within stomach. Additional lines and tubes as above. 2. Nonobstructed bowel-gas. Electronically Signed   By: Michaelle Birks M.D.   On: 06/04/2021 08:39   DG Abd 1 View  Result Date: 06/03/2021 CLINICAL DATA:  Distension EXAM: ABDOMEN - 1 VIEW COMPARISON:  06/01/2021 FINDINGS: Nonobstructed gas pattern with paucity of bowel gas. Bilateral femoral  catheters, right catheter tip over lies the right aspect of the L2 vertebral body. Left catheter tip overlies the upper aspect of left sacrum. IMPRESSION: Nonobstructed gas pattern with overall paucity of bowel gas. Esophageal tube has been removed. Electronically Signed   By: Donavan Foil M.D.   On: 06/03/2021 23:25   DG Abd 1 View  Result Date: 06/01/2021 CLINICAL DATA:  Encounter for feeding tube placement EXAM: ABDOMEN - 1 VIEW COMPARISON:  X-ray abdomen 06/11/2020. FINDINGS: Enteric tube coursing below the hemidiaphragm with tip and side port overlying the gastric lumen. Tip likely in the region of the pylorus/first portion of the duodenum. Bilateral femoral approach catheters noted with left catheter overlying the L5 vertebral body and right catheter with tip overlying the right aspect of the L1 vertebral body. Nonobstructive bowel gas pattern. Slight distension of the small bowel with gas. No radio-opaque calculi or other significant radiographic abnormality are seen. IMPRESSION: Enteric tube coursing below within the gastric lumen with tip overlying the expected region of the pylorus. Consider retracting by 3 cm. Electronically Signed   By: Iven Finn M.D.   On: 06/01/2021 18:11   CT HEAD WO CONTRAST (5MM)  Result Date: 06/04/2021 CLINICAL DATA:  Initial evaluation for neuro deficit, stroke suspected. EXAM: CT HEAD WITHOUT CONTRAST TECHNIQUE: Contiguous axial images were obtained from the base of the skull through the vertex without intravenous contrast. RADIATION DOSE REDUCTION: This exam was performed according to the departmental dose-optimization program which includes automated exposure control, adjustment of the mA and/or kV according to patient size and/or use of iterative reconstruction technique. COMPARISON:  Prior CT from 06/01/2021. FINDINGS: Brain: Cerebral volume stable, and remains within normal limits. No acute intracranial hemorrhage. No acute large vessel territory infarct. No  mass lesion or midline shift. No hydrocephalus or extra-axial fluid collection. Vascular:  No hyperdense vessel. Skull: Scalp soft tissues demonstrate no new abnormality. Suspected anasarca. Calvarium intact. Sinuses/Orbits: Globes and orbital soft tissues demonstrate no acute finding. Scattered mucosal thickening noted about the sphenoid ethmoidal and maxillary sinuses. Few scattered superimposed small air-fluid levels noted. Trace bilateral mastoid effusions. Other: None. IMPRESSION: 1. Stable head CT with no acute intracranial abnormality. 2. Scattered paranasal sinus disease with superimposed small air-fluid levels, suggesting acute sinusitis. Associated trace mastoid effusions. Appearance is similar to previous. Electronically Signed   By: Jeannine Boga M.D.   On: 06/04/2021 01:14   CT HEAD WO CONTRAST (5MM)  Result Date: 06/01/2021 CLINICAL DATA:  Altered mental status EXAM: CT HEAD WITHOUT CONTRAST TECHNIQUE: Contiguous axial images were obtained from the base of the skull through the vertex without intravenous contrast. RADIATION DOSE REDUCTION: This exam was performed according to the departmental dose-optimization program which includes automated exposure control, adjustment of the mA and/or kV according to patient size and/or use of iterative reconstruction technique. COMPARISON:  05/21/2016 FINDINGS: Brain: No acute intracranial findings are seen. There are no signs of bleeding within the cranium. Ventricles are not dilated. There is no shift of midline structures. There are no epidural or subdural fluid collections. Vascular: Unremarkable. Skull: Unremarkable. Sinuses/Orbits: Air-fluid level is seen in the sphenoid sinus. There is mucosal thickening in the ethmoid and maxillary sinuses. Small air-fluid levels are seen in both maxillary sinuses. Other: None IMPRESSION: No acute intracranial findings are seen in noncontrast CT brain. Chronic sinusitis. Air-fluid levels are seen in the sphenoid  and maxillary sinuses suggesting possible acute sinusitis. Electronically Signed   By: Elmer Picker M.D.   On: 06/01/2021 17:03   CT CHEST WO CONTRAST  Result Date: 05/31/2021 CLINICAL DATA:  Body aches, chills, nausea since Sunday. EXAM: CT CHEST WITHOUT CONTRAST TECHNIQUE: Multidetector CT imaging of the chest was performed following the standard protocol without IV contrast. RADIATION DOSE REDUCTION: This exam was performed according to the departmental dose-optimization program which includes automated exposure control, adjustment of the mA and/or kV according to patient size and/or use of iterative reconstruction technique. COMPARISON:  Same day chest radiograph, CT chest 01/02/2020 FINDINGS: Cardiovascular: The heart is not enlarged. There is trace pericardial fluid. The blood pool is hypodense suggesting anemia. The vasculature is unremarkable, as can be assessed in the absence of intravenous contrast. Mediastinum/Nodes: The thyroid is unremarkable. The esophagus is grossly unremarkable. There is no mediastinal or axillary lymphadenopathy. There is no bulky hilar adenopathy, suboptimally evaluated in the absence of intravenous contrast. Lungs/Pleura: The trachea and central airways are patent. The lungs are well inflated. There is no focal consolidation or pulmonary edema. There is no pleural effusion or pneumothorax. Upper Abdomen: The imaged portions of the upper abdominal viscera are unremarkable. Musculoskeletal: Multiple remote right-sided rib fractures are seen. There is no acute osseous abnormality or aggressive osseous lesion. Other: There is mild diffuse body wall edema. IMPRESSION: 1. Trace pericardial effusion and mild diffuse body wall edema, new since 01/02/2020. 2. Clear lungs, with no focal consolidation or pleural effusion. 3. Hypodense blood pool consistent with anemia. Electronically Signed   By: Valetta Mole M.D.   On: 05/31/2021 12:08   US RENAL  Result Date:  05/31/2021 CLINICAL DATA:  Acute kidney injury EXAM: RENAL / URINARY TRACT ULTRASOUND COMPLETE COMPARISON:  06/10/2020 FINDINGS: Right Kidney: Renal measurements: 10.9 x 6.1 x 7.0 cm = volume: 244 mL. Increased cortical echogenicity with medical renal disease. Trace retroperitoneal perinephric edema. No hydronephrosis focal abnormality. Left Kidney: Renal  measurements: 11.1 x 6.9 x 6.5 cm = volume: 257 mL. Similar increased cortical echogenicity compatible with medical renal disease. Similar trace retroperitoneal perinephric fluid/edema. No hydronephrosis. No focal abnormality. Bladder: Nearly collapsed, accounting for wall prominence Other: None. IMPRESSION: Increased renal echogenicity compatible with medical renal disease. Retroperitoneal perinephric edema suspect related to volume overload. Electronically Signed   By: Jerilynn Mages.  Shick M.D.   On: 05/31/2021 14:01   US Venous Img Lower Bilateral (DVT)  Result Date: 06/04/2021 CLINICAL DATA:  Initial evaluation for bilateral lower extremity swelling. EXAM: BILATERAL LOWER EXTREMITY VENOUS DOPPLER ULTRASOUND TECHNIQUE: Gray-scale sonography with graded compression, as well as color Doppler and duplex ultrasound were performed to evaluate the lower extremity deep venous systems from the level of the common femoral vein and including the common femoral, femoral, profunda femoral, popliteal and calf veins including the posterior tibial, peroneal and gastrocnemius veins when visible. The superficial great saphenous vein was also interrogated. Spectral Doppler was utilized to evaluate flow at rest and with distal augmentation maneuvers in the common femoral, femoral and popliteal veins. COMPARISON:  None. FINDINGS: RIGHT LOWER EXTREMITY Common Femoral Vein: No evidence of thrombus. Normal compressibility, respiratory phasicity and response to augmentation. Saphenofemoral Junction: No evidence of thrombus. Normal compressibility and flow on color Doppler imaging. Profunda  Femoral Vein: No evidence of thrombus. Normal compressibility and flow on color Doppler imaging. Femoral Vein: No evidence of thrombus. Normal compressibility, respiratory phasicity and response to augmentation. Popliteal Vein: No evidence of thrombus. Normal compressibility, respiratory phasicity and response to augmentation. Calf Veins: No evidence of thrombus. Normal compressibility and flow on color Doppler imaging. Superficial Great Saphenous Vein: No evidence of thrombus. Normal compressibility. Venous Reflux:  None. Other Findings:  None. LEFT LOWER EXTREMITY Common Femoral Vein: Not seen, obscured by overlying bandaging material. Saphenofemoral Junction: Not seen, obscured by overlying bandaging material. Profunda Femoral Vein: Not seen, obscured by overlying bandaging material. Femoral Vein: No evidence of thrombus. Normal compressibility, respiratory phasicity and response to augmentation. Popliteal Vein: No evidence of thrombus. Normal compressibility, respiratory phasicity and response to augmentation. Calf Veins: No evidence of thrombus. Normal compressibility and flow on color Doppler imaging. Superficial Great Saphenous Vein: No evidence of thrombus. Normal compressibility. Venous Reflux:  None. Other Findings:  None. IMPRESSION: 1. Nonvisualization of the left common femoral vein, saphenofemoral junction, and profunda femoral vein, obscured by overlying bandaging material. 2. Otherwise no evidence of deep venous thrombosis in either lower extremity. Electronically Signed   By: Jeannine Boga M.D.   On: 06/04/2021 05:30   DG Chest Port 1 View  Result Date: 06/08/2021 CLINICAL DATA:  Support devices EXAM: PORTABLE CHEST 1 VIEW COMPARISON:  Chest x-ray dated June 05, 2021 FINDINGS: Unchanged position of ET tube. Interval placement of esophageal temperature probe with tip just below the thoracic inlet. New left IJ line with tip positioned over the expected area of the upper right atrium.  Cardiac and mediastinal contours are unchanged. Interval resolution of bibasilar opacities. No large pleural effusion or evidence of pneumothorax. IMPRESSION: 1. New left IJ line with tip positioned over the expected area of the upper right atrium. 2. Interval placement of enteric tube with tip overlying the stomach and side port near the GE junction, recommend advancement for optimal positioning. 3. Interval resolution of bibasilar opacities. Electronically Signed   By: Yetta Glassman M.D.   On: 06/08/2021 13:37   DG Chest Port 1 View  Result Date: 06/02/2021 CLINICAL DATA:  Acute respiratory failure, hypoxia EXAM: PORTABLE CHEST 1  VIEW COMPARISON:  Chest radiograph 1 day prior FINDINGS: The endotracheal tube is approximately 2.6 cm from the carina. The enteric catheter tip is off the field of view. A presumed esophageal temperature probe terminates in the upper thorax. The cardiac silhouette is enlarged, unchanged. The mediastinal contours are stable. There are patchy opacities in the lung bases, particularly on the right, likely not significantly changed in the interim. There is no significant pleural effusion. There is no pneumothorax. There is no acute osseous abnormality. IMPRESSION: 1. Unchanged enlargement of the cardiac silhouette. 2. Patchy bibasilar opacities are not significantly changed and may reflect atelectasis or developing infection. 3. Presumed esophageal temperature probe terminating in the upper thorax. Endotracheal tube in satisfactory position. Enteric catheter tip is off the field of view. Electronically Signed   By: Valetta Mole M.D.   On: 06/02/2021 07:25   DG Chest Port 1 View  Result Date: 06/01/2021 CLINICAL DATA:  Intubation. EXAM: PORTABLE CHEST 1 VIEW COMPARISON:  Chest radiographs and CT 05/31/2021 FINDINGS: An endotracheal tube has been placed and terminates proximally 2.5 cm above the carina. Telemetry leads overlie the chest. The cardiac silhouette is accentuated by  portable AP technique although true mild interval enlargement, such as from a pericardial effusion, is not excluded. Lung volumes are lower than on the prior radiographs, and there are mild opacities in both lung bases. No sizable pleural effusion or pneumothorax is identified. IMPRESSION: 1. Endotracheal tube in appropriate position. 2. Low lung volumes with mild bibasilar opacities which may reflect atelectasis or infection. Electronically Signed   By: Logan Bores M.D.   On: 06/01/2021 13:56   ECHOCARDIOGRAM COMPLETE  Result Date: 06/01/2021    ECHOCARDIOGRAM REPORT   Patient Name:   DECLIN RAJAN Date of Exam: 06/01/2021 Medical Rec #:  [id number]       Height:       68.0 in Accession #:    [id number]      Weight:       145.0 lb Date of Birth:  Dec 30, 1995       BSA:          1.783 m Patient Age:    25 years        BP:           155/83 mmHg Patient Gender: M               HR:           95 bpm. Exam Location:  [facility] Procedure: 2D Echo, Color Doppler, Cardiac Doppler and Strain Analysis Indications:     I46.9 Cardiac arrest  History:         Patient has no prior history of Echocardiogram examinations.                  ESRD, stage IV, Signs/Symptoms:Fever; Risk Factors:Diabetes.  Sonographer:     Charmayne Sheer Referring Phys:  [id number] Bradly Bienenstock Diagnosing Phys: Ida Rogue MD  Sonographer Comments: Echo performed with patient supine and on artificial respirator. Global longitudinal strain was attempted. IMPRESSIONS  1. Left ventricular ejection fraction, by estimation, is 35 to 40%. The left ventricle has moderately decreased function. The left ventricle demonstrates global hypokinesis. There is moderate left ventricular hypertrophy. Left ventricular diastolic parameters are consistent with Grade II diastolic dysfunction (pseudonormalization). The average left ventricular global longitudinal strain is -11.3 %. The global longitudinal strain is abnormal.  2. Right ventricular systolic function is mildly  reduced. The right ventricular size is normal.  There is moderately elevated pulmonary artery systolic pressure. The estimated right ventricular systolic pressure is 32.9 mmHg.  3. Left atrial size was mildly dilated.  4. The mitral valve is normal in structure. Mild mitral valve regurgitation. No evidence of mitral stenosis.  5. The aortic valve is normal in structure. Aortic valve regurgitation is mild. No aortic stenosis is present.  6. The inferior vena cava is dilated in size with <50% respiratory variability, suggesting right atrial pressure of 15 mmHg. FINDINGS  Left Ventricle: Left ventricular ejection fraction, by estimation, is 35 to 40%. The left ventricle has moderately decreased function. The left ventricle demonstrates global hypokinesis. The average left ventricular global longitudinal strain is -11.3 %. The global longitudinal strain is abnormal. The left ventricular internal cavity size was normal in size. There is moderate left ventricular hypertrophy. Left ventricular diastolic parameters are consistent with Grade II diastolic dysfunction (pseudonormalization). Right Ventricle: The right ventricular size is normal. No increase in right ventricular wall thickness. Right ventricular systolic function is mildly reduced. There is moderately elevated pulmonary artery systolic pressure. The tricuspid regurgitant velocity is 2.80 m/s, and with an assumed right atrial pressure of 15 mmHg, the estimated right ventricular systolic pressure is 51.8 mmHg. Left Atrium: Left atrial size was mildly dilated. Right Atrium: Right atrial size was normal in size. Pericardium: There is no evidence of pericardial effusion. Mitral Valve: The mitral valve is normal in structure. Mild mitral valve regurgitation. No evidence of mitral valve stenosis. MV peak gradient, 5.4 mmHg. The mean mitral valve gradient is 3.0 mmHg. Tricuspid Valve: The tricuspid valve is normal in structure. Tricuspid valve regurgitation is mild . No  evidence of tricuspid stenosis. Aortic Valve: The aortic valve is normal in structure. Aortic valve regurgitation is mild. No aortic stenosis is present. Aortic valve mean gradient measures 6.0 mmHg. Aortic valve peak gradient measures 10.1 mmHg. Aortic valve area, by VTI measures 2.64  cm. Pulmonic Valve: The pulmonic valve was normal in structure. Pulmonic valve regurgitation is trivial. No evidence of pulmonic stenosis. Aorta: The aortic root is normal in size and structure. Venous: The inferior vena cava is dilated in size with less than 50% respiratory variability, suggesting right atrial pressure of 15 mmHg. IAS/Shunts: No atrial level shunt detected by color flow Doppler.  LEFT VENTRICLE PLAX 2D LVIDd:         4.90 cm   Diastology LVIDs:         3.79 cm   LV e' medial:    8.59 cm/s LV PW:         1.43 cm   LV E/e' medial:  14.4 LV IVS:        0.93 cm   LV e' lateral:   9.46 cm/s LVOT diam:     2.10 cm   LV E/e' lateral: 13.1 LV SV:         58 LV SV Index:   33        2D Longitudinal Strain LVOT Area:     3.46 cm  2D Strain GLS Avg:     -11.3 %  RIGHT VENTRICLE RV Basal diam:  3.95 cm RV S prime:     11.40 cm/s LEFT ATRIUM             Index        RIGHT ATRIUM           Index LA diam:        4.40 cm 2.47 cm/m   RA Area:  20.10 cm LA Vol (A2C):   77.6 ml 43.53 ml/m  RA Volume:   60.00 ml  33.66 ml/m LA Vol (A4C):   73.9 ml 41.45 ml/m LA Biplane Vol: 79.4 ml 44.54 ml/m  AORTIC VALVE                     PULMONIC VALVE AV Area (Vmax):    2.40 cm      PV Vmax:       1.12 m/s AV Area (Vmean):   2.23 cm      PV Vmean:      82.500 cm/s AV Area (VTI):     2.64 cm      PV VTI:        0.187 m AV Vmax:           159.00 cm/s   PV Peak grad:  5.0 mmHg AV Vmean:          113.000 cm/s  PV Mean grad:  3.0 mmHg AV VTI:            0.220 m AV Peak Grad:      10.1 mmHg AV Mean Grad:      6.0 mmHg LVOT Vmax:         110.00 cm/s LVOT Vmean:        72.600 cm/s LVOT VTI:          0.168 m LVOT/AV VTI ratio: 0.76  AORTA Ao  Root diam: 2.80 cm MITRAL VALVE                TRICUSPID VALVE MV Area (PHT): 5.16 cm     TR Peak grad:   31.4 mmHg MV Area VTI:   2.47 cm     TR Vmax:        280.00 cm/s MV Peak grad:  5.4 mmHg MV Mean grad:  3.0 mmHg     SHUNTS MV Vmax:       1.16 m/s     Systemic VTI:  0.17 m MV Vmean:      77.4 cm/s    Systemic Diam: 2.10 cm MV Decel Time: 147 msec MV E velocity: 124.00 cm/s MV A velocity: 54.40 cm/s MV E/A ratio:  2.28 Ida Rogue MD Electronically signed by Ida Rogue MD Signature Date/Time: 06/01/2021/6:41:43 PM    Final    ECHOCARDIOGRAM LIMITED  Result Date: 06/06/2021    ECHOCARDIOGRAM LIMITED REPORT   Patient Name:   BIFF RUTIGLIANO Date of Exam: 06/05/2021 Medical Rec #:  [id number]       Height:       68.0 in Accession #:    [id number]      Weight:       152.1 lb Date of Birth:  13-Apr-1996       BSA:          1.819 m Patient Age:    25 years        BP:           102/80 mmHg Patient Gender: M               HR:           56 bpm. Exam Location:  [facility] Procedure: 2D Echo, Color Doppler and Cardiac Doppler Indications:     CHF I50.9  History:         Patient has prior history of Echocardiogram examinations, most  recent 06/01/2021. Risk Factors:Diabetes.  Sonographer:     Sherrie Sport Referring Phys:  [id number] ADAM ROSS SCHERTZ Diagnosing Phys: Ida Rogue MD  Sonographer Comments: Echo performed with patient supine and on artificial respirator. IMPRESSIONS  1. Left ventricular ejection fraction, by estimation, is 35 to 40%. The left ventricle has moderately decreased function. The left ventricle demonstrates global hypokinesis. There is mild left ventricular hypertrophy. Left ventricular diastolic parameters are indeterminate.  2. Right ventricular systolic function is mildly reduced. The right ventricular size is normal. There is normal pulmonary artery systolic pressure. The estimated right ventricular systolic pressure is 99.3 mmHg.  3. The mitral valve is normal in structure.  Mild mitral valve regurgitation. No evidence of mitral stenosis.  4. Tricuspid valve regurgitation is mild to moderate.  5. The aortic valve has an indeterminant number of cusps. Aortic valve regurgitation is mild. No aortic stenosis is present.  6. The inferior vena cava is normal in size with greater than 50% respiratory variability, suggesting right atrial pressure of 3 mmHg. FINDINGS  Left Ventricle: Left ventricular ejection fraction, by estimation, is 35 to 40%. The left ventricle has moderately decreased function. The left ventricle demonstrates global hypokinesis. The left ventricular internal cavity size was normal in size. There is mild left ventricular hypertrophy. Left ventricular diastolic parameters are indeterminate. Right Ventricle: The right ventricular size is normal. No increase in right ventricular wall thickness. Right ventricular systolic function is mildly reduced. There is normal pulmonary artery systolic pressure. The tricuspid regurgitant velocity is 2.58 m/s, and with an assumed right atrial pressure of 5 mmHg, the estimated right ventricular systolic pressure is 71.6 mmHg. Left Atrium: Left atrial size was normal in size. Right Atrium: Right atrial size was normal in size. Pericardium: There is no evidence of pericardial effusion. Mitral Valve: The mitral valve is normal in structure. Mild mitral valve regurgitation. No evidence of mitral valve stenosis. Tricuspid Valve: The tricuspid valve is normal in structure. Tricuspid valve regurgitation is mild to moderate. No evidence of tricuspid stenosis. Aortic Valve: The aortic valve has an indeterminant number of cusps. Aortic valve regurgitation is mild. No aortic stenosis is present. Aortic valve mean gradient measures 3.5 mmHg. Aortic valve peak gradient measures 7.1 mmHg. Aortic valve area, by VTI measures 2.90 cm. Pulmonic Valve: The pulmonic valve was normal in structure. Pulmonic valve regurgitation is not visualized. No evidence of  pulmonic stenosis. Aorta: The aortic root is normal in size and structure. Venous: The inferior vena cava is normal in size with greater than 50% respiratory variability, suggesting right atrial pressure of 3 mmHg. IAS/Shunts: No atrial level shunt detected by color flow Doppler. LEFT VENTRICLE PLAX 2D LVIDd:         4.60 cm LVIDs:         4.00 cm LV PW:         1.60 cm LV IVS:        0.90 cm LVOT diam:     2.20 cm LV SV:         39 LV SV Index:   21 LVOT Area:     3.80 cm  RIGHT VENTRICLE RV Basal diam:  3.90 cm RV S prime:     11.00 cm/s TAPSE (M-mode): 1.8 cm LEFT ATRIUM             Index        RIGHT ATRIUM           Index LA diam:  3.40 cm 1.87 cm/m   RA Area:     13.20 cm LA Vol (A2C):   49.2 ml 27.04 ml/m  RA Volume:   29.90 ml  16.43 ml/m LA Vol (A4C):   31.9 ml 17.53 ml/m LA Biplane Vol: 41.9 ml 23.03 ml/m  AORTIC VALVE                    PULMONIC VALVE AV Area (Vmax):    2.38 cm     PV Vmax:        0.57 m/s AV Area (Vmean):   2.50 cm     PV Vmean:       35.800 cm/s AV Area (VTI):     2.90 cm     PV VTI:         0.060 m AV Vmax:           133.50 cm/s  PV Peak grad:   1.3 mmHg AV Vmean:          83.900 cm/s  PV Mean grad:   1.0 mmHg AV VTI:            0.134 m      RVOT Peak grad: 4 mmHg AV Peak Grad:      7.1 mmHg AV Mean Grad:      3.5 mmHg LVOT Vmax:         83.60 cm/s LVOT Vmean:        55.100 cm/s LVOT VTI:          0.102 m LVOT/AV VTI ratio: 0.76  AORTA Ao Root diam: 2.93 cm MITRAL VALVE                TRICUSPID VALVE MV Area (PHT): 3.65 cm     TR Peak grad:   26.6 mmHg MV Decel Time: 208 msec     TR Vmax:        258.00 cm/s MV E velocity: 101.00 cm/s                             SHUNTS                             Systemic VTI:  0.10 m                             Systemic Diam: 2.20 cm                             Pulmonic VTI:  0.115 m Ida Rogue MD Electronically signed by Ida Rogue MD Signature Date/Time: 06/06/2021/1:23:45 PM    Final    CT CHEST ABDOMEN PELVIS WO  CONTRAST  Result Date: 06/04/2021 CLINICAL DATA:  Sepsis. EXAM: CT CHEST, ABDOMEN AND PELVIS WITHOUT CONTRAST TECHNIQUE: Multidetector CT imaging of the chest, abdomen and pelvis was performed following the standard protocol without IV contrast. RADIATION DOSE REDUCTION: This exam was performed according to the departmental dose-optimization program which includes automated exposure control, adjustment of the mA and/or kV according to patient size and/or use of iterative reconstruction technique. COMPARISON:  Chest CT dated 05/31/2021. FINDINGS: Evaluation of this exam is limited in the absence of intravenous contrast. Evaluation is also limited due to streak artifact caused by patient's arms as well as anasarca. CT CHEST FINDINGS Cardiovascular: Top-normal  cardiac size. No pericardial effusion. There is hypoattenuation of the cardiac blood pool suggestive of anemia. Clinical correlation is recommended. Mild atherosclerotic calcification of the aortic arch. The central pulmonary arteries are grossly unremarkable. Mediastinum/Nodes: Evaluation of the hilar and mediastinal structure is very limited due to factors above. No mediastinal fluid collection. Lungs/Pleura: Small bilateral pleural effusions. There are large areas of consolidative change involving the lower lobes, right greater left. There is near complete consolidation of the right lower lobe. There is impaction of the right lower lobe bronchus as well as impaction of the left lower lobe bronchi which may represent aspiration. No pneumothorax. Tracheostomy with tip approximately 3 cm above the carina. Musculoskeletal: Diffuse chest wall edema and anasarca. No acute osseous pathology. Old healed right posterior rib fracture. CT ABDOMEN PELVIS FINDINGS No intra-abdominal free air.  Small ascites. Hepatobiliary: The liver is unremarkable. Mild periportal edema. Probable gallbladder sludge. No calcified gallstone. Pancreas: Unremarkable. No pancreatic ductal  dilatation or surrounding inflammatory changes. Spleen: Normal in size without focal abnormality. Adrenals/Urinary Tract: The adrenal glands are unremarkable. There is no hydronephrosis or nephrolithiasis on either side. The urinary bladder is decompressed around a Foley catheter. Stomach/Bowel: A rectal tube is in place. No bowel obstruction. The appendix is unremarkable as visualized. Vascular/Lymphatic: The abdominal aorta and IVC are grossly unremarkable. Right femoral approach venous line with tip in the IVC inferior to the renal vein. Left femoral approach venous line with tip in the left common iliac vein. Right femoral arterial line with tip in the right external iliac artery. No portal venous gas. No adenopathy. Reproductive: The prostate and seminal vesicles are grossly unremarkable. Other: Diffuse subcutaneous edema and anasarca. Musculoskeletal: No acute or significant osseous findings. IMPRESSION: 1. Impacted bilateral lower lobe bronchi with large areas of consolidation involving the lower lobes with near complete consolidation the right lower lobe, new since the prior CT. Findings likely represent postobstructive atelectasis or pneumonia. Aspiration is not excluded. Small bilateral pleural effusions also new since the prior CT. 2. Small ascites and anasarca. 3. Aortic Atherosclerosis (ICD10-I70.0). Electronically Signed   By: Anner Crete M.D.   On: 06/04/2021 00:07    Assessment and plan-   # severe thrombocytopenia Due to severe sepsis. No MAHA, HIT.  Previous platelet transfusion, count has improved.  Platelet count has trended up.   #Anemia of chronic kidney disease. Hb stable. # Septic shock, due to aspiration pneumonia # CKD, on HD/CRRT, nephrology following.   Thank you for allowing me to participate in the care of this patient.   Earlie Server, MD, PhD 06/12/2021

## 2021-06-12 NOTE — Progress Notes (Signed)
Speech Language Pathology Treatment:    Patient Details Name: Jonathan Mcmillan MRN: [id number] DOB: Aug 19, 1995 Today's Date: 06/12/2021 Time: 9024-0973 SLP Time Calculation (min) (ACUTE ONLY): 20 min  Assessment / Plan / Recommendation Clinical Impression  Pt seen for diet tolerance. Pt alert. Flat affect. Slow to respond. Pt required frequent repositioning to maintain upright positioning due to L lateral lean.   Pt observed with consistencies from mech soft / thin liquid meal tray including pancakes with syrup, applesauce, and apple juice via straw. Pt also observed with medication x1 at a time whole with applesauce as administered by RN. Pt with mildly prolonged mastication of solids which may be due to dental status. Noted pt easily distracted by environmental stimuli (e.g. TV) which could be affecting oral swallow. No overt or subtle s/sx pharyngeal dysphagia noted across trials.   Per chart review, WBC trending downward and temp WNL. Pt on room air. No recent chest imaging.   Recommend continuation of a mech soft diet with thin liquids and safe swallowing strategies/aspiration precautions as outlined below.   SLP to f/u x1 for trials of upgraded texture.    HPI HPI: Per admitting H&P " Jonathan Mcmillan is a 26 y.o. male with medical history significant for diabetes mellitus with complications of stage IV chronic kidney disease, hypertension, nicotine dependence who presents to the emergency room for evaluation of a 3-day history of fever, chills, nonproductive cough, myalgias and diarrhea.  He denies having any sick contacts.  He denies having any urinary frequency, nocturia or dysuria and denies having any abdominal pain.  He denies having a headache, no sore throat, no chest pain, no shortness of breath, no back pain, no lower extremity swelling, no focal deficits of blurred vision.  He denies having any hematemesis, no melena stools or hematochezia and denies NSAID use.  Upon arrival to the  ER he was noted to have a T-max of 102.6, he was tachycardic and tachypneic.   Sodium 131, potassium 4.4, chloride 102, bicarb 15, glucose 177, BUN 19, creatinine 13.86 when compared to baseline of 3.92, calcium 6.7, alkaline phosphatase 70, albumin 2.9, lipase 29, AST 24, ALT 33, total protein 6.3, lactic acid 2.1, white count 12.7, hemoglobin 5.2 compared to baseline of 8.3, hematocrit 15.8, MCV 89.8, RDW 12.8, platelet count 172, PT 15.8, INR 1.3  Respiratory viral panel is negative  Urine analysis shows proteinuria  Chest x-ray reviewed by me shows no evidence of acute cardiopulmonary disease  Twelve-lead EKG reviewed by me shows sinus tachycardia with nonspecific T wave changes in the lateral leads."      SLP Plan  Continue with current plan of care      Recommendations for follow up therapy are one component of a multi-disciplinary discharge planning process, led by the attending physician.  Recommendations may be updated based on patient status, additional functional criteria and insurance authorization.    Recommendations  Diet recommendations: Dysphagia 3 (mechanical soft);Thin liquid Liquids provided via: Cup;Straw Medication Administration: Whole meds with puree Supervision: Staff to assist with self feeding;Intermittent supervision to cue for compensatory strategies Compensations: Slow rate;Small sips/bites;Follow solids with liquid Postural Changes and/or Swallow Maneuvers: Out of bed for meals;Seated upright 90 degrees;Upright 30-60 min after meal                Oral Care Recommendations: Oral care before and after PO;Oral care BID Follow Up Recommendations: Skilled nursing-short term rehab (<3 hours/day) Assistance recommended at discharge: Frequent or constant Supervision/Assistance SLP Visit Diagnosis:  Dysphagia, oropharyngeal phase (R13.12) Plan: Continue with current plan of care         Cherrie Gauze, M.S., [facility] [phone] ([place])    Quintella Baton  06/12/2021, 10:45 AM

## 2021-06-12 NOTE — Progress Notes (Addendum)
Hemodialysis notes.   HD treatment completed. Just towards the end 15 minutes before treatment end, pt BP went down to 96/64, pt was asymptomatic, UF turned  off then repeat BP after 5 minutes, BP was 104/69, UF turned on again. Total UF removed 421ml. Post blood pressure 114/74.

## 2021-06-12 NOTE — Progress Notes (Signed)
Occupational Therapy Treatment Patient Details Name: Jonathan Mcmillan MRN: [id number] DOB: Feb 02, 1996 Today's Date: 06/12/2021   History of present illness Pt is a 26 y/o M admitted on 06/01/21 with AKI on CKD in setting of viral syndrome & diarrhea. Pt suffered in-hospital cardiac arrest, suspect due to severe metabolic acidosis & multiple metabolic derangements. Required CRRT and now preparing to undergo regular HD with femoral catheter placed on 1/28. Pt was extubated on 1/21 but reintubated 1/22. Extubated again on 1/26 & now weaned to room air. PMH: DM2, CKD stage IV, HTN, nicotine dependence.   OT comments  Jonathan Mcmillan was seen for OT treatment on this date. Upon arrival to room pt reclined in bed, agreeable to tx. Pt tolerated BUE therex using yellow thereband (provided) as described below. Provided green theraputty and instructed on HEP. Pt requires SETUP for cell phone use at bed level with noted improved grip strength, x1 instance of dropping phone onto bed. MAX A for LBD at bed level. MOD A rolling L+R for linen change. Pt continues to be limited by temporary fem cath for HD, anticipate will progress to OOB once medically ready. Pt making good progress toward goals. Pt continues to benefit from skilled OT services to maximize return to PLOF and minimize risk of future falls, injury, caregiver burden, and readmission. Will continue to follow POC. Discharge recommendation remains appropriate.     Recommendations for follow up therapy are one component of a multi-disciplinary discharge planning process, led by the attending physician.  Recommendations may be updated based on patient status, additional functional criteria and insurance authorization.    Follow Up Recommendations  Acute inpatient rehab (3hours/day)    Assistance Recommended at Discharge Frequent or constant Supervision/Assistance  Patient can return home with the following  A lot of help with walking and/or transfers;A lot of help  with bathing/dressing/bathroom;Assistance with cooking/housework;Direct supervision/assist for medications management;Assist for transportation;Help with stairs or ramp for entrance   Equipment Recommendations  Other (comment) (defer pending OOB mobility assessment)    Recommendations for Other Services      Precautions / Restrictions Precautions Precautions: Fall Precaution Comments: R temporary femoral dialysis catheter, per Dr. Ulice Bold, not to get OOB with therapy at this time. Restrictions Weight Bearing Restrictions: No       Mobility Bed Mobility Overal bed mobility: Needs Assistance Bed Mobility: Rolling Rolling: Mod assist              Transfers                   General transfer comment: deferred-temporary femoral catheter for HD         ADL either performed or assessed with clinical judgement   ADL Overall ADL's : Needs assistance/impaired                                       General ADL Comments: SETUP cell phone use at bed level with improved grip strength, x1 instanc eof dropping phone onto bed. MAX A for LBD at bed level. MOD A rolling L+R for pericare      Cognition Arousal/Alertness: Awake/alert Behavior During Therapy: Flat affect Overall Cognitive Status: Within Functional Limits for tasks assessed                                 General Comments:  Pt is oriented x4, appropriate, but slow to respond        Exercises Exercises: General Upper Extremity General Exercises - Upper Extremity Shoulder Flexion: AROM, Strengthening, Both, 10 reps, Supine, Theraband Theraband Level (Shoulder Flexion): Level 1 (Yellow) Shoulder ABduction: AROM, Strengthening, Both, 10 reps, Supine, Theraband Theraband Level (Shoulder Abduction): Level 1 (Yellow) Shoulder ADduction: AROM, Strengthening, Both, 10 reps, Supine, Theraband Theraband Level (Shoulder Adduction): Level 1 (Yellow) Shoulder Horizontal  ABduction: AROM, Strengthening, Both, 10 reps, Supine, Theraband Theraband Level (Shoulder Horizontal Abduction): Level 1 (Yellow) Digit Composite Flexion: AROM, Right, Both, 10 reps, Supine, Other (comment) (green theraputty)            Pertinent Vitals/ Pain       Pain Assessment Pain Assessment: No/denies pain   Frequency  Min 3X/week        Progress Toward Goals  OT Goals(current goals can now be found in the care plan section)  Progress towards OT goals: Progressing toward goals  Acute Rehab OT Goals Patient Stated Goal: to get better OT Goal Formulation: With patient Time For Goal Achievement: 06/24/21 Potential to Achieve Goals: Fair ADL Goals Pt Will Perform Eating: with set-up Pt Will Perform Grooming: with set-up Pt Will Perform Upper Body Bathing: with min guard assist Pt/caregiver will Perform Home Exercise Program: Increased strength;Both right and left upper extremity;With minimal assist Additional ADL Goal #1: Pt will tolerate further mobilization (once cleared by nephrology; at least sup to sit with MAX A) to allow for further development of OT POC.  Plan Discharge plan remains appropriate;Frequency remains appropriate    Co-evaluation                 AM-PAC OT "6 Clicks" Daily Activity     Outcome Measure   Help from another person eating meals?: A Little Help from another person taking care of personal grooming?: A Little Help from another person toileting, which includes using toliet, bedpan, or urinal?: A Lot Help from another person bathing (including washing, rinsing, drying)?: A Lot Help from another person to put on and taking off regular upper body clothing?: A Little Help from another person to put on and taking off regular lower body clothing?: A Lot 6 Click Score: 15    End of Session    OT Visit Diagnosis: Muscle weakness (generalized) (M62.81);Adult, failure to thrive (R62.7)   Activity Tolerance Patient tolerated treatment  well   Patient Left in bed;with call bell/phone within reach;with bed alarm set   Nurse Communication Mobility status        Time: 8657-8469 OT Time Calculation (min): 24 min  Charges: OT General Charges $OT Visit: 1 Visit OT Treatments $Self Care/Home Management : 8-22 mins $Therapeutic Exercise: 8-22 mins  Dessie Coma, M.S. OTR/L  06/12/21, 4:08 PM  ascom [phone]

## 2021-06-12 NOTE — Consult Note (Signed)
[facility] for Electrolyte Monitoring and Replacement   Recent Labs: Potassium (mmol/L)  Date Value  06/12/2021 3.8  08/05/2013 3.8   Magnesium (mg/dL)  Date Value  06/12/2021 2.8 (H)   Calcium (mg/dL)  Date Value  06/12/2021 6.5 (L)   Calcium, Total (PTH) (mg/dL)  Date Value  06/12/2020 8.0   Albumin (g/dL)  Date Value  06/12/2021 1.7 (L)  08/05/2013 2.9 (L)   Phosphorus (mg/dL)  Date Value  06/12/2021 4.4   Sodium (mmol/L)  Date Value  06/12/2021 134 (L)  08/05/2013 133 (L)   Assessment: Jonathan Mcmillan is a 26 y.o. male with medical history including diabetes, CKD, HTN, nicotine dependence admitted on 05/31/2021 with  AKI on CKD, anemia, and fevers/chills/myalgias . Patient suffered cardiac arrest on the morning of 1/19 and was ultimately intubated and transferred to the ICU. Patient extubated on 1/26 and off vasopressors. CRRT discontinued per nephrology. Pharmacy has been consulted to monitor and replace electrolytes.  Goal of Therapy:  Electrolytes within normal limits  Plan:  --Corrected calcium 8.3 mg/dL --Electrolytes within normal limits, no replacement required at this time --F/u with AM labs.   Owens Loffler 06/12/2021 7:18 AM

## 2021-06-12 NOTE — Progress Notes (Signed)
SUBJECTIVE: Jonathan Mcmillan is a 26 y.o. male with medical history including diabetes, CKD, HTN, nicotine dependence admitted on 05/31/2021 with AKI on CKD, anemia, and fevers/chills/myalgias . Patient suffered cardiac arrest on the morning of 1/19 and was ultimately intubated and transferred to the ICU. Patient extubated 06/08/21.  Patient alert and oriented. Denies chest pain, shortness of breath.  Vitals:   06/12/21 0800 06/12/21 0830 06/12/21 0900 06/12/21 0930  BP: 113/71 114/78 109/76 110/75  Pulse: 70 65 69 66  Resp: (!) 23  (!) 23 12  Temp: (!) 97.5 F (36.4 C)     TempSrc: Oral     SpO2: 100% 98% 99% 99%  Weight:      Height:         Intake/Output Summary (Last 24 hours) at 06/12/2021 0947 Last data filed at 06/12/2021 0700 Gross per 24 hour  Intake 670 ml  Output 100 ml  Net 570 ml    LABS: Basic Metabolic Panel: Recent Labs    06/11/21 0334 06/12/21 0339  NA 135 134*  K 3.3* 3.8  CL 104 104  CO2 23 22  GLUCOSE 164* 151*  BUN 54* 69*  CREATININE 2.30* 2.71*  CALCIUM 6.8* 6.5*  MG 2.7* 2.8*  PHOS 3.2 4.4   Liver Function Tests: Recent Labs    06/11/21 0334 06/12/21 0339  AST  --  16  ALT  --  6  ALKPHOS  --  158*  BILITOT  --  1.3*  PROT  --  5.0*  ALBUMIN 1.8* 1.7*   No results for input(s): LIPASE, AMYLASE in the last 72 hours. CBC: Recent Labs    06/11/21 0334 06/12/21 0339  WBC 18.7* 13.5*  HGB 8.1* 8.6*  HCT 23.9* 25.1*  MCV 87.2 85.7  PLT 35* 38*   Cardiac Enzymes: No results for input(s): CKTOTAL, CKMB, CKMBINDEX, TROPONINI in the last 72 hours. BNP: Invalid input(s): POCBNP D-Dimer: No results for input(s): DDIMER in the last 72 hours. Hemoglobin A1C: No results for input(s): HGBA1C in the last 72 hours. Fasting Lipid Panel: No results for input(s): CHOL, HDL, LDLCALC, TRIG, CHOLHDL, LDLDIRECT in the last 72 hours. Thyroid Function Tests: No results for input(s): TSH, T4TOTAL, T3FREE, THYROIDAB in the last 72 hours.  Invalid  input(s): FREET3 Anemia Panel: No results for input(s): VITAMINB12, FOLATE, FERRITIN, TIBC, IRON, RETICCTPCT in the last 72 hours.   PHYSICAL EXAM General: Well developed, well nourished, in no acute distress HEENT:  Normocephalic and atramatic Neck:  No JVD.  Lungs: Clear bilaterally to auscultation and percussion. Heart: HRRR . Normal S1 and S2 without gallops or murmurs.  Abdomen: Bowel sounds are positive, abdomen soft and non-tender  Msk:  Back normal, normal gait. Normal strength and tone for age. Extremities: No clubbing, cyanosis or edema.   Neuro: Alert and oriented X 3. Psych:  Good affect, responds appropriately  TELEMETRY: sinus rhythm, HR 67 bpm  ASSESSMENT AND PLAN: Status post atrial fibrillation with rapid ventricular response rate currently on p.o. amiodarone 200 twice daily advised changing to 200  daily.  Status post cardiac arrest due to asystole now extubated and much more alert and oriented.  Principal Problem:   AKI (acute kidney injury) ([place]) Active Problems:   Hypertension   Acute kidney injury superimposed on CKD ([place])   Type 2 diabetes mellitus with hypoglycemia without coma, without long-term current use of insulin (H[facility])   Nicotine dependence   Symptomatic anemia   Hypotension   Fever   Cardiopulmonary arrest (  [place])   Pressure injury of skin   Acute respiratory failure with hypoxia ([facility])   Thrombocytopenia ([place])    Bauer Ausborn, FNP-C 06/12/2021 9:47 AM

## 2021-06-12 NOTE — Progress Notes (Addendum)
Nutrition Follow-up  DOCUMENTATION CODES:   Not applicable  INTERVENTION:   -Nepro Shake po BID, each supplement provides 425 kcal and 19 grams protein  -Renal MVI daily -Double protein portions with meals -500 mg vitamin C BID -220 mg zinc sulfate daily x 14 days  NUTRITION DIAGNOSIS:   Inadequate oral intake related to inability to eat (pt sedated and ventilated) as evidenced by NPO status.  Progressing; advanced to PO diet on 06/10/21  GOAL:   Patient will meet greater than or equal to 90% of their needs  Progressing   MONITOR:   Labs, Weight trends, TF tolerance, Skin, I & O's, Diet advancement  REASON FOR ASSESSMENT:   Malnutrition Screening Tool, Ventilator    ASSESSMENT:   26 y.o. male with medical history significant for type 1 diabetes mellitus, stage IV chronic kidney disease, hypertension and nicotine dependence who presents to the emergency room for evaluation of a 3-day history of fever, chills, nonproductive cough, myalgias and diarrhea. Pt found to have SIRS, AKI and anemia.  1/25- extubated 1/26- NGT placed and TF initiated 1/28- CRRT d/c, NGT d/c, s/p BSE- advanced to dysphagia 1 diet with thin liquids 1/29- s/p BSE- advanced to dysphagia 3 diet with thin liquids  Reviewed I/O's: +770 ml x 24 hours and +717 ml since admission  UOP: 0 ml x 24 hours  Stool output: 100 ml x 24 hours  Case discussed with MD, RN, and during ICU rounds.   Per RN, pt desires to leave the hospital and does not understand the severity of his medical conditions. Per RN, pt is eating well. Noted meal completions 30-40%.   Plan for iHD today per nephrology; unsure if pt will need HD long term.   Medications reviewed and include solu-cortef and thiamine.   Labs reviewed: Na: 134, Mg: 2.8, CBGS: 87-255 (inpatient orders for glycemic control are 3-9 units insulin aspart every 4 hours and 10 units insulin glargine-yfgn daily).     Diet Order:   Diet Order              DIET DYS 3 Fluid consistency: Thin  Diet effective now                   EDUCATION NEEDS:   No education needs have been identified at this time  Skin:  Skin Assessment: Skin Integrity Issues: Skin Integrity Issues:: Stage II, Other (Comment) Stage II: rt buttocks Other: full thickness wound to mid sacrum, pwounds to bilateral heels (pink)  Last BM:  06/11/21  Height:   Ht Readings from Last 1 Encounters:  06/03/21 5' 7.99" (1.727 m)    Weight:   Wt Readings from Last 1 Encounters:  06/12/21 64.7 kg    Ideal Body Weight:  70 kg  BMI:  Body mass index is 21.69 kg/m.  Estimated Nutritional Needs:   Kcal:  2100-2300  Protein:  105-120 grams  Fluid:  1000 ml + UOP    Loistine Chance, RD, LDN, [place] Registered Dietitian II Certified Diabetes Care and Education Specialist Please refer to [facility] for RD and/or RD on-call/weekend/after hours pager

## 2021-06-13 ENCOUNTER — Encounter: Payer: Self-pay | Admitting: Internal Medicine

## 2021-06-13 LAB — CBC
HCT: 28.3 % — ABNORMAL LOW (ref 39.0–52.0)
Hemoglobin: 9.5 g/dL — ABNORMAL LOW (ref 13.0–17.0)
MCH: 29.4 pg (ref 26.0–34.0)
MCHC: 33.6 g/dL (ref 30.0–36.0)
MCV: 87.6 fL (ref 80.0–100.0)
Platelets: 46 10*3/uL — ABNORMAL LOW (ref 150–400)
RBC: 3.23 MIL/uL — ABNORMAL LOW (ref 4.22–5.81)
RDW: 16.2 % — ABNORMAL HIGH (ref 11.5–15.5)
WBC: 9.9 10*3/uL (ref 4.0–10.5)
nRBC: 0 % (ref 0.0–0.2)

## 2021-06-13 LAB — GLUCOSE, CAPILLARY
Glucose-Capillary: 108 mg/dL — ABNORMAL HIGH (ref 70–99)
Glucose-Capillary: 117 mg/dL — ABNORMAL HIGH (ref 70–99)
Glucose-Capillary: 154 mg/dL — ABNORMAL HIGH (ref 70–99)
Glucose-Capillary: 156 mg/dL — ABNORMAL HIGH (ref 70–99)
Glucose-Capillary: 160 mg/dL — ABNORMAL HIGH (ref 70–99)
Glucose-Capillary: 179 mg/dL — ABNORMAL HIGH (ref 70–99)
Glucose-Capillary: 184 mg/dL — ABNORMAL HIGH (ref 70–99)
Glucose-Capillary: 234 mg/dL — ABNORMAL HIGH (ref 70–99)
Glucose-Capillary: 40 mg/dL — CL (ref 70–99)
Glucose-Capillary: 50 mg/dL — ABNORMAL LOW (ref 70–99)
Glucose-Capillary: 53 mg/dL — ABNORMAL LOW (ref 70–99)
Glucose-Capillary: 56 mg/dL — ABNORMAL LOW (ref 70–99)

## 2021-06-13 LAB — MAGNESIUM: Magnesium: 2.4 mg/dL (ref 1.7–2.4)

## 2021-06-13 LAB — RENAL FUNCTION PANEL
Albumin: 1.8 g/dL — ABNORMAL LOW (ref 3.5–5.0)
Anion gap: 9 (ref 5–15)
BUN: 56 mg/dL — ABNORMAL HIGH (ref 6–20)
CO2: 24 mmol/L (ref 22–32)
Calcium: 6.6 mg/dL — ABNORMAL LOW (ref 8.9–10.3)
Chloride: 100 mmol/L (ref 98–111)
Creatinine, Ser: 2.81 mg/dL — ABNORMAL HIGH (ref 0.61–1.24)
GFR, Estimated: 31 mL/min — ABNORMAL LOW (ref >60)
Glucose, Bld: 50 mg/dL — ABNORMAL LOW (ref 70–99)
Phosphorus: 3.7 mg/dL (ref 2.5–4.6)
Potassium: 3.6 mmol/L (ref 3.5–5.1)
Sodium: 133 mmol/L — ABNORMAL LOW (ref 135–145)

## 2021-06-13 LAB — HEPATITIS B SURFACE ANTIBODY, QUANTITATIVE: Hep B S AB Quant (Post): 16.6 m[IU]/mL (ref >9.9)

## 2021-06-13 MED ORDER — DEXTROSE 50 % IV SOLN: 25.0000 mL | Freq: Once | INTRAVENOUS | Status: AC

## 2021-06-13 MED ORDER — DEXTROSE 50 % IV SOLN
INTRAVENOUS | Status: AC
  Administered 2021-06-13: 25 mL
  Filled 2021-06-13: qty 50

## 2021-06-13 MED ORDER — INSULIN ASPART 100 UNIT/ML IJ SOLN
0.0000 [IU] | Freq: Three times a day (TID) | INTRAMUSCULAR | Status: DC
  Administered 2021-06-13 (×2): 3 [IU] via SUBCUTANEOUS
  Administered 2021-06-14: 5 [IU] via SUBCUTANEOUS
  Administered 2021-06-14 – 2021-06-16 (×4): 2 [IU] via SUBCUTANEOUS
  Administered 2021-06-17: 3 [IU] via SUBCUTANEOUS
  Administered 2021-06-17: 2 [IU] via SUBCUTANEOUS
  Administered 2021-06-17: 5 [IU] via SUBCUTANEOUS
  Administered 2021-06-18 (×2): 3 [IU] via SUBCUTANEOUS
  Administered 2021-06-18: 8 [IU] via SUBCUTANEOUS
  Administered 2021-06-19: 2 [IU] via SUBCUTANEOUS
  Administered 2021-06-20: 5 [IU] via SUBCUTANEOUS
  Administered 2021-06-22: 3 [IU] via SUBCUTANEOUS
  Administered 2021-06-22: 5 [IU] via SUBCUTANEOUS
  Filled 2021-06-13 (×17): qty 1

## 2021-06-13 MED ORDER — INSULIN GLARGINE-YFGN 100 UNIT/ML ~~LOC~~ SOLN
5.0000 [IU] | Freq: Every day | SUBCUTANEOUS | Status: DC
  Administered 2021-06-14 – 2021-06-19 (×5): 5 [IU] via SUBCUTANEOUS
  Filled 2021-06-13 (×9): qty 0.05

## 2021-06-13 NOTE — Progress Notes (Signed)
Palliative:  Discussed with Dr. Mortimer Fries and Dr. Sheppard Coil. Patient has improved. No further palliative needs. Will sign off.   No charge  Vinie Sill, NP Palliative Medicine Team Pager [phone] (Please see [email or web address] for schedule) Team Phone [phone]

## 2021-06-13 NOTE — Progress Notes (Signed)
Hypoglycemia Patient started on Semglee 10 units QHS on 06/11/21 - Nursing gave juice & crackers then 1 amp of D50 per hypoglycemia protocol, recheck 154 - dropped long acting coverage to 5 units Semglee QHS - continue resistant SSI coverage while on steroids - follow hypoglycemia protocol   Jonathan Mcmillan, AGACNP-BC Acute Care Nurse Practitioner [facility]   [phone] / [phone] Please see Amion for pager details.

## 2021-06-13 NOTE — Progress Notes (Signed)
[facility]  ROUNDING NOTE   Subjective:   Patient underwent hemodialysis treatment yesterday. 0.5 kg were removed. Patient awake and alert this AM.  Objective:  Vital signs in last 24 hours:  Temp:  [93.3 F (34.1 C)-97.5 F (36.4 C)] 97.5 F (36.4 C) (01/31 0037) Pulse Rate:  [61-85] 70 (01/31 0705) Resp:  [0-23] 12 (01/31 0705) BP: (96-146)/(64-107) 122/91 (01/31 0700) SpO2:  [98 %-100 %] 100 % (01/31 0705) Weight:  [64.1 kg-64.8 kg] 64.8 kg (01/31 0422)  Weight change: 0 kg Filed Weights   06/12/21 1738 06/12/21 2100 06/13/21 0422  Weight: 64.7 kg 64.1 kg 64.8 kg    Intake/Output: I/O last 3 completed shifts: In: 378.9 [P.O.:240; I.V.:8.9; Other:30; IV Piggyback:100] Out: 093 [Other:455; Stool:500]   Intake/Output this shift:  Total I/O In: 2.2 [I.V.:2.2] Out: -   Physical Exam: General: No acute distress  Eyes: Anicteric  Lungs:  Scattered rhonchi, normal effort  Heart: S1S2, no rubs  Abdomen:  Soft, NTND, BS present  Extremities: Trace peripheral edema.  Neurologic: Awake, alert, no rbus  Skin: Peeling skin noted  Access:  Left IJ temporary dialysis catheter  Rectal tube, Foley in place  Basic Metabolic Panel: Recent Labs  Lab 06/09/21 0411 06/09/21 1600 06/10/21 0500 06/11/21 0334 06/12/21 0339 06/13/21 0404  NA 136   134* 139 134* 135 134* 133*  K 3.8   3.8 4.1 3.8 3.3* 3.8 3.6  CL 105   104 103 103 104 104 100  CO2 25   25 24 24 23 22 24   GLUCOSE 92   93 167* 161* 164* 151* 50*  BUN 32*   32* 31* 36* 54* 69* 56*  CREATININE 1.31*   1.24 1.34* 1.75* 2.30* 2.71* 2.81*  CALCIUM 6.7*   6.6* 7.3* 6.7* 6.8* 6.5* 6.6*  MG 2.4  --  2.5* 2.7* 2.8* 2.4  PHOS 2.0*   2.1* 2.5 3.0 3.2 4.4 3.7     Liver Function Tests: Recent Labs  Lab 06/09/21 0411 06/09/21 1600 06/10/21 0500 06/11/21 0334 06/12/21 0339 06/13/21 0404  AST 25  --   --   --  16  --   ALT 29  --   --   --  6  --   ALKPHOS 194*  --   --   --  158*  --   BILITOT  2.7*  --   --   --  1.3*  --   PROT 4.8*  --   --   --  5.0*  --   ALBUMIN 1.6*   1.7* 1.8* 1.7* 1.8* 1.7* 1.8*    Recent Labs  Lab 06/08/21 0407  LIPASE 16    No results for input(s): AMMONIA in the last 168 hours.   CBC: Recent Labs  Lab 06/07/21 0435 06/07/21 2004 06/08/21 0407 06/09/21 0411 06/09/21 1338 06/10/21 0500 06/11/21 0334 06/12/21 0339 06/13/21 0404  WBC 32.6* 34.2*   < > 24.8*  --  22.7* 18.7* 13.5* 9.9  NEUTROABS 27.6* 26.5*  --   --   --   --   --   --   --   HGB 7.8* 7.3*   < > 5.6* 8.3* 7.7* 8.1* 8.6* 9.5*  HCT 22.7* 20.9*   < > 15.8* 23.2* 21.7* 23.9* 25.1* 28.3*  MCV 87.6 85.3   < > 85.9  --  84.1 87.2 85.7 87.6  PLT 9* 21*   < > 23*  --  33* 35* 38* 46*   < > =  values in this interval not displayed.     Cardiac Enzymes: No results for input(s): CKTOTAL, CKMB, CKMBINDEX, TROPONINI in the last 168 hours.   BNP: Invalid input(s): POCBNP  CBG: Recent Labs  Lab 06/13/21 0408 06/13/21 0409 06/13/21 0411 06/13/21 0442 06/13/21 0721  GLUCAP <10* 40* 56* 154* 117*     Microbiology: Results for orders placed or performed during the hospital encounter of 05/31/21  Resp Panel by RT-PCR (Flu A&B, Covid) Nasopharyngeal Swab     Status: None   Collection Time: 05/31/21  6:58 AM   Specimen: Nasopharyngeal Swab; Nasopharyngeal(NP) swabs in vial transport medium  Result Value Ref Range Status   SARS Coronavirus 2 by RT PCR NEGATIVE NEGATIVE Final    Comment: (NOTE) SARS-CoV-2 target nucleic acids are NOT DETECTED.  The SARS-CoV-2 RNA is generally detectable in upper respiratory specimens during the acute phase of infection. The lowest concentration of SARS-CoV-2 viral copies this assay can detect is 138 copies/mL. A negative result does not preclude SARS-Cov-2 infection and should not be used as the sole basis for treatment or other patient management decisions. A negative result may occur with  improper specimen collection/handling,  submission of specimen other than nasopharyngeal swab, presence of viral mutation(s) within the areas targeted by this assay, and inadequate number of viral copies(<138 copies/mL). A negative result must be combined with clinical observations, patient history, and epidemiological information. The expected result is Negative.  Fact Sheet for Patients:  EntrepreneurPulse.com.au  Fact Sheet for Healthcare Providers:  IncredibleEmployment.be  This test is no t yet approved or cleared by the [place] FDA and  has been authorized for detection and/or diagnosis of SARS-CoV-2 by FDA under an Emergency Use Authorization (EUA). This EUA will remain  in effect (meaning this test can be used) for the duration of the COVID-19 declaration under Section 564(b)(1) of the Act, 21 U.S.C.section 360bbb-3(b)(1), unless the authorization is terminated  or revoked sooner.       Influenza A by PCR NEGATIVE NEGATIVE Final   Influenza B by PCR NEGATIVE NEGATIVE Final    Comment: (NOTE) The Xpert Xpress SARS-CoV-2/FLU/RSV plus assay is intended as an aid in the diagnosis of influenza from Nasopharyngeal swab specimens and should not be used as a sole basis for treatment. Nasal washings and aspirates are unacceptable for Xpert Xpress SARS-CoV-2/FLU/RSV testing.  Fact Sheet for Patients: EntrepreneurPulse.com.au  Fact Sheet for Healthcare Providers: IncredibleEmployment.be  This test is not yet approved or cleared by the [place] FDA and has been authorized for detection and/or diagnosis of SARS-CoV-2 by FDA under an Emergency Use Authorization (EUA). This EUA will remain in effect (meaning this test can be used) for the duration of the COVID-19 declaration under Section 564(b)(1) of the Act, 21 U.S.C. section 360bbb-3(b)(1), unless the authorization is terminated or revoked.  Performed at [facility], [place],  [address]   Respiratory (~20 pathogens) panel by PCR     Status: None   Collection Time: 05/31/21  6:58 AM   Specimen: Nasopharyngeal Swab; Respiratory  Result Value Ref Range Status   Adenovirus NOT DETECTED NOT DETECTED Final   Coronavirus 229E NOT DETECTED NOT DETECTED Final    Comment: (NOTE) The Coronavirus on the Respiratory Panel, DOES NOT test for the novel  Coronavirus (2019 nCoV)    Coronavirus HKU1 NOT DETECTED NOT DETECTED Final   Coronavirus NL63 NOT DETECTED NOT DETECTED Final   Coronavirus OC43 NOT DETECTED NOT DETECTED Final   Metapneumovirus NOT  DETECTED NOT DETECTED Final   Rhinovirus / Enterovirus NOT DETECTED NOT DETECTED Final   Influenza A NOT DETECTED NOT DETECTED Final   Influenza B NOT DETECTED NOT DETECTED Final   Parainfluenza Virus 1 NOT DETECTED NOT DETECTED Final   Parainfluenza Virus 2 NOT DETECTED NOT DETECTED Final   Parainfluenza Virus 3 NOT DETECTED NOT DETECTED Final   Parainfluenza Virus 4 NOT DETECTED NOT DETECTED Final   Respiratory Syncytial Virus NOT DETECTED NOT DETECTED Final   Bordetella pertussis NOT DETECTED NOT DETECTED Final   Bordetella Parapertussis NOT DETECTED NOT DETECTED Final   Chlamydophila pneumoniae NOT DETECTED NOT DETECTED Final   Mycoplasma pneumoniae NOT DETECTED NOT DETECTED Final    Comment: Performed at [facility], [place] [address], [place], [place] [address]  Group A Strep by PCR [place] Only)     Status: None   Collection Time: 05/31/21  8:35 AM   Specimen: Throat; Sterile Swab  Result Value Ref Range Status   Group A Strep by PCR NOT DETECTED NOT DETECTED Final    Comment: Performed at [facility], [address] [place], [place] [address]  Blood culture (routine x 2)     Status: None   Collection Time: 05/31/21  8:39 AM   Specimen: BLOOD  Result Value Ref Range Status   Specimen Description BLOOD BLOOD RIGHT FOREARM  Final   Special Requests   Final    BOTTLES  DRAWN AEROBIC AND ANAEROBIC Blood Culture results may not be optimal due to an excessive volume of blood received in culture bottles   Culture   Final    NO GROWTH 5 DAYS Performed at [facility], [address] [place], [place] [address]    Report Status 06/05/2021 FINAL  Final  Blood culture (routine x 2)     Status: None   Collection Time: 05/31/21  8:39 AM   Specimen: BLOOD  Result Value Ref Range Status   Specimen Description BLOOD RIGHT ANTECUBITAL  Final   Special Requests   Final    BOTTLES DRAWN AEROBIC AND ANAEROBIC Blood Culture results may not be optimal due to an excessive volume of blood received in culture bottles   Culture   Final    NO GROWTH 5 DAYS Performed at [facility], [address] [place], [place] [address]    Report Status 06/05/2021 FINAL  Final  C Difficile Quick Screen w PCR reflex     Status: None   Collection Time: 05/31/21  1:20 PM   Specimen: Stool  Result Value Ref Range Status   C Diff antigen NEGATIVE NEGATIVE Final   C Diff toxin NEGATIVE NEGATIVE Final   C Diff interpretation No C. difficile detected.  Final    Comment: Performed at [facility], [address] [place],  [address]  Gastrointestinal Panel by PCR , Stool     Status: None   Collection Time: 05/31/21  1:30 PM   Specimen: Stool  Result Value Ref Range Status   Campylobacter species NOT DETECTED NOT DETECTED Final   Plesimonas shigelloides NOT DETECTED NOT DETECTED Final   Salmonella species NOT DETECTED NOT DETECTED Final   Yersinia enterocolitica NOT DETECTED NOT DETECTED Final   Vibrio species NOT DETECTED NOT DETECTED Final   Vibrio cholerae NOT DETECTED NOT DETECTED Final   Enteroaggregative E coli (EAEC) NOT DETECTED NOT DETECTED Final   Enteropathogenic E coli (EPEC) NOT DETECTED NOT DETECTED Final   Enterotoxigenic E coli (ETEC) NOT  DETECTED NOT DETECTED Final   Shiga like toxin producing E coli (STEC) NOT DETECTED NOT DETECTED  Final   Shigella/Enteroinvasive E coli (EIEC) NOT DETECTED NOT DETECTED Final   Cryptosporidium NOT DETECTED NOT DETECTED Final   Cyclospora cayetanensis NOT DETECTED NOT DETECTED Final   Entamoeba histolytica NOT DETECTED NOT DETECTED Final   Giardia lamblia NOT DETECTED NOT DETECTED Final   Adenovirus F40/41 NOT DETECTED NOT DETECTED Final   Astrovirus NOT DETECTED NOT DETECTED Final   Norovirus GI/GII NOT DETECTED NOT DETECTED Final   Rotavirus A NOT DETECTED NOT DETECTED Final   Sapovirus (I, II, IV, and V) NOT DETECTED NOT DETECTED Final    Comment: Performed at [facility], [address], [place], [place] [address]  Urine Culture     Status: None   Collection Time: 06/01/21 10:23 AM   Specimen: In/Out Cath Urine  Result Value Ref Range Status   Specimen Description   Final    IN/OUT CATH URINE Performed at [facility], [address], [place], [place] [address]    Special Requests   Final    NONE Performed at [facility], [address] [place], [place] [address]    Culture   Final    NO GROWTH Performed at [facility], [place] [address], [place], [place] [address]    Report Status 06/03/2021 FINAL  Final  Culture, Respiratory w Gram Stain     Status: None   Collection Time: 06/01/21  3:59 PM   Specimen: Tracheal Aspirate; Respiratory  Result Value Ref Range Status   Specimen Description   Final    TRACHEAL ASPIRATE Performed at [facility], [address], [place], [place] [address]    Special Requests   Final    NONE Performed at [facility], [place], [place] [address]    Gram Stain   Final    RARE SQUAMOUS EPITHELIAL CELLS PRESENT MODERATE WBC PRESENT,BOTH PMN AND MONONUCLEAR MODERATE GRAM POSITIVE COCCI FEW GRAM NEGATIVE RODS FEW GRAM POSITIVE RODS RARE YEAST    Culture   Final    FEW Consistent with normal respiratory flora. No Pseudomonas species isolated Performed at [facility] [address] [place], [place] [address]    Report Status 06/03/2021 FINAL  Final  CULTURE, BLOOD (ROUTINE X 2) w Reflex to ID Panel     Status: None   Collection Time: 06/01/21  7:06 PM   Specimen: BLOOD  Result Value Ref Range Status   Specimen Description BLOOD RIGHT ANTECUBITAL  Final   Special Requests   Final    BOTTLES DRAWN AEROBIC ONLY Blood Culture results may not be optimal due to an inadequate volume of blood received in culture bottles   Culture   Final    NO GROWTH 5 DAYS Performed at [facility], [address] [place], [place] [address]    Report Status 06/06/2021 FINAL  Final  CULTURE, BLOOD (ROUTINE X 2) w Reflex to ID Panel     Status: None   Collection Time: 06/01/21  7:37 PM   Specimen: BLOOD  Result Value Ref Range Status   Specimen Description BLOOD [facility]  Final   Special Requests BOTTLES DRAWN AEROBIC AND ANAEROBIC BCLV  Final   Culture   Final    NO GROWTH 5 DAYS Performed at [facility] [address] [place],  [address]    Report Status 06/06/2021 FINAL  Final  MRSA  Next Gen by PCR, Nasal     Status: None   Collection Time: 06/02/21 10:46 AM   Specimen: Nasal Mucosa; Nasal Swab  Result Value Ref Range Status   MRSA by PCR Next Gen NOT DETECTED NOT DETECTED Final    Comment: (NOTE) The GeneXpert MRSA Assay (FDA approved for NASAL specimens only), is one component of a comprehensive MRSA colonization surveillance program. It is not intended to diagnose MRSA infection nor to guide or monitor treatment for MRSA infections. Test performance is not FDA approved in patients less than 54 years old. Performed at [facility], [address] [place], [place] [address]   Culture, Respiratory w Gram Stain     Status: None   Collection Time: 06/04/21  7:38 AM   Specimen: Tracheal Aspirate; Respiratory  Result Value Ref Range Status   Specimen Description   Final    TRACHEAL ASPIRATE Performed at  [facility], [address], [place], [place] [address]    Special Requests   Final    NONE Performed at [facility], [address] [place], [place] [address]    Gram Stain   Final    MODERATE WBC PRESENT,BOTH PMN AND MONONUCLEAR NO ORGANISMS SEEN Performed at [facility], [place] [address], [place], [place] [address]    Culture FEW CANDIDA ALBICANS RARE ESCHERICHIA COLI   Final   Report Status 06/07/2021 FINAL  Final   Organism ID, Bacteria ESCHERICHIA COLI  Final      Susceptibility   Escherichia coli - MIC*    AMPICILLIN >=32 RESISTANT Resistant     CEFAZOLIN <=4 SENSITIVE Sensitive     CEFEPIME <=0.12 SENSITIVE Sensitive     CEFTAZIDIME <=1 SENSITIVE Sensitive     CEFTRIAXONE <=0.25 SENSITIVE Sensitive     CIPROFLOXACIN <=0.25 SENSITIVE Sensitive     GENTAMICIN <=1 SENSITIVE Sensitive     IMIPENEM <=0.25 SENSITIVE Sensitive     TRIMETH/SULFA >=320 RESISTANT Resistant     AMPICILLIN/SULBACTAM >=32 RESISTANT Resistant     PIP/TAZO <=4 SENSITIVE Sensitive     * RARE ESCHERICHIA COLI  CULTURE, BLOOD (ROUTINE X 2) w Reflex to ID Panel     Status: None   Collection Time: 06/04/21  6:35 PM   Specimen: BLOOD  Result Value Ref Range Status   Specimen Description BLOOD BLOOD LEFT HAND  Final   Special Requests   Final    BOTTLES DRAWN AEROBIC AND ANAEROBIC Blood Culture adequate volume   Culture   Final    NO GROWTH 5 DAYS Performed at [facility], [address], [place],  [address]    Report Status 06/09/2021 FINAL  Final    Coagulation Studies: No results for input(s): LABPROT, INR in the last 72 hours.   Urinalysis: No results for input(s): COLORURINE, LABSPEC, PHURINE, GLUCOSEU, HGBUR, BILIRUBINUR, KETONESUR, PROTEINUR, UROBILINOGEN, NITRITE, LEUKOCYTESUR in the last 72 hours.  Invalid input(s): APPERANCEUR     Imaging: No results found.   Medications:    sodium chloride     sodium chloride     sodium chloride  Stopped (06/12/21 2313)    ceFAZolin (ANCEF) IV 1 g (06/12/21 2136)    sodium chloride   Intravenous Once   amiodarone  200 mg Oral BID   vitamin C  500 mg Oral BID   chlorhexidine gluconate (MEDLINE KIT)  15 mL Mouth Rinse BID   Chlorhexidine Gluconate Cloth  6 each Topical Daily   Chlorhexidine Gluconate Cloth  6  each Topical Q0600   collagenase   Topical Daily   feeding supplement (NEPRO CARB STEADY)  237 mL Oral BID BM   hydrocerin   Topical BID   hydrocortisone sod succinate (SOLU-CORTEF) inj  50 mg Intravenous Q12H   insulin aspart  3-9 Units Subcutaneous Q4H   insulin glargine-yfgn  5 Units Subcutaneous QHS   mouth rinse  15 mL Mouth Rinse BID   multivitamin  1 tablet Oral QHS   pantoprazole  40 mg Oral Daily   sodium chloride flush  10-40 mL Intracatheter Q12H   thiamine injection  100 mg Intravenous Q24H   zinc sulfate  220 mg Oral Daily   sodium chloride, sodium chloride, sodium chloride, alteplase, docusate, fentaNYL (SUBLIMAZE) injection, guaiFENesin-dextromethorphan, heparin, ipratropium-albuterol, lidocaine (PF), lidocaine-prilocaine, lip balm, ondansetron **OR** ondansetron (ZOFRAN) IV, oxyCODONE, pentafluoroprop-tetrafluoroeth, polyvinyl alcohol, sodium chloride flush  Assessment/ Plan:  [name] is a 26 y.o. black male with hypertension, insulin dependent diabetes mellitus type I, diabetic gastroparesis, diabetic neuropathy, tobacco use, THC use, right toe amputation, perirectal abscess, who is admitted to [facility] on 05/31/2021 for SIRS (systemic inflammatory response syndrome) ([place]) [R65.10] AKI (acute kidney injury) ([place]) [N17.9] Symptomatic anemia [D64.9] Acute renal failure superimposed on stage 5 chronic kidney disease, not on chronic dialysis, unspecified acute renal failure type ([place]) [N17.9, N18.5] Hypertension, unspecified type [I10] Acute cough [R05.1]  Cardiac arrest with code blue 06/01/2021 at 10 am. Transferred to ICU. Intubated  Acute kidney  injury on chronic kidney disease stage IV versus progression of chronic kidney disease to end stage renal disease. Baseline creatinine of 4.13, GFR of 19 on 08/26/20. History of nephrotic range proteinuria and hematuria. No history of renal biopsy. Strong family history of dialysis with mother with ESRD before passing. Was on CRRT.  -No urine output.  Patient underwent dialysis treatment yesterday.  We will need to consider PermCath placement soon.    Acute respiratory failure Extubated then re-intubated 06/03/2021; extubated 06/08/2021 -  Respiratory status stable at the moment.   Hypotension with cardiogenic shock -Blood pressure currently 122/91.  Maintain the patient on hydrocortisone.  4. Insulin dependent Diabetes type 1 with CKD and proteinuria   Lab Results  Component Value Date   HGBA1C 5.7 (H) 05/31/2021    5. Thrombocytopenia Hematology evaluation on 1/22 Suspected peripheral destruction Platelets currently 46,000.  We will continue to monitor.    LOS: 13 Jaelle Campanile 1/31/20237:46 AM

## 2021-06-13 NOTE — Progress Notes (Addendum)
Hypoglycemic Event  CBG: 53  Treatment: 4 oz juice/soda  Symptoms: None  Follow-up CBG: Time:0411 CBG Result:53  Possible Reasons for Event: Inadequate meal intake and Medication regimen:    Comments/MD notified:n/a    Joanne Gavel  Administered a 65ml of D50 at 0411, poct at 0443 is 154.

## 2021-06-13 NOTE — Progress Notes (Signed)
Unable to obtain a oral or axillary temperature at this time. Applied warning blanket.

## 2021-06-13 NOTE — Progress Notes (Signed)
SUBJECTIVE: Jonathan Mcmillan is a 26 y.o. male with medical history including diabetes, CKD, HTN, nicotine dependence admitted on 05/31/2021 with AKI on CKD, anemia, and fevers/chills/myalgias . Patient suffered cardiac arrest on the morning of 1/19 and was ultimately intubated and transferred to the ICU. Patient extubated 06/08/21.   Patient alert and oriented, responding to questions appropriately. Denies chest pain, shortness of breath.   Vitals:   06/13/21 0700 06/13/21 0705 06/13/21 0800 06/13/21 0830  BP: (!) 122/91  116/78   Pulse: 66 70 65 71  Resp: (!) 8 12 (!) 0 12  Temp:    (!) 96.7 F (35.9 C)  TempSrc:    Oral  SpO2: 99% 100% 95% 100%  Weight:      Height:        Intake/Output Summary (Last 24 hours) at 06/13/2021 0840 Last data filed at 06/13/2021 1324 Gross per 24 hour  Intake 211.09 ml  Output 855 ml  Net -643.91 ml    LABS: Basic Metabolic Panel: Recent Labs    06/12/21 0339 06/13/21 0404  NA 134* 133*  K 3.8 3.6  CL 104 100  CO2 22 24  GLUCOSE 151* 50*  BUN 69* 56*  CREATININE 2.71* 2.81*  CALCIUM 6.5* 6.6*  MG 2.8* 2.4  PHOS 4.4 3.7   Liver Function Tests: Recent Labs    06/12/21 0339 06/13/21 0404  AST 16  --   ALT 6  --   ALKPHOS 158*  --   BILITOT 1.3*  --   PROT 5.0*  --   ALBUMIN 1.7* 1.8*   No results for input(s): LIPASE, AMYLASE in the last 72 hours. CBC: Recent Labs    06/12/21 0339 06/13/21 0404  WBC 13.5* 9.9  HGB 8.6* 9.5*  HCT 25.1* 28.3*  MCV 85.7 87.6  PLT 38* 46*   Cardiac Enzymes: No results for input(s): CKTOTAL, CKMB, CKMBINDEX, TROPONINI in the last 72 hours. BNP: Invalid input(s): POCBNP D-Dimer: No results for input(s): DDIMER in the last 72 hours. Hemoglobin A1C: No results for input(s): HGBA1C in the last 72 hours. Fasting Lipid Panel: No results for input(s): CHOL, HDL, LDLCALC, TRIG, CHOLHDL, LDLDIRECT in the last 72 hours. Thyroid Function Tests: No results for input(s): TSH, T4TOTAL, T3FREE,  THYROIDAB in the last 72 hours.  Invalid input(s): FREET3 Anemia Panel: No results for input(s): VITAMINB12, FOLATE, FERRITIN, TIBC, IRON, RETICCTPCT in the last 72 hours.   PHYSICAL EXAM General: Well developed, well nourished, in no acute distress HEENT:  Normocephalic and atramatic Neck:  No JVD.  Lungs: Clear bilaterally to auscultation and percussion. Heart: HRRR . Normal S1 and S2 without gallops or murmurs.  Abdomen: Bowel sounds are positive, abdomen soft and non-tender  Msk:  Back normal, normal gait. Normal strength and tone for age. Extremities: No clubbing, cyanosis or edema.   Neuro: Alert and oriented X 3. Psych:  Good affect, responds appropriately  TELEMETRY: normal sinus rhythm, HR 69 bpm  ASSESSMENT AND PLAN: Status post atrial fibrillation with rapid ventricular response rate currently on p.o. amiodarone 200 twice daily advised changing to 200 mg daily.  Status post cardiac arrest due to asystole now extubated and much more alert and oriented.  Principal Problem:   AKI (acute kidney injury) ([place]) Active Problems:   Hypertension   Acute kidney injury superimposed on CKD ([place])   Type 2 diabetes mellitus with hypoglycemia without coma, without long-term current use of insulin (HCC)   Nicotine dependence   Symptomatic anemia   Hypotension  Fever   Cardiopulmonary arrest ([place])   Pressure injury of skin   Acute respiratory failure with hypoxia (HCC)   Thrombocytopenia (HCC)   Anemia in chronic kidney disease, on chronic dialysis ([place])    Dailee Manalang, FNP-C 06/13/2021 8:40 AM

## 2021-06-13 NOTE — Consult Note (Signed)
[place] for Electrolyte Monitoring and Replacement   Recent Labs: Potassium (mmol/L)  Date Value  06/13/2021 3.6  08/05/2013 3.8   Magnesium (mg/dL)  Date Value  06/13/2021 2.4   Calcium (mg/dL)  Date Value  06/13/2021 6.6 (L)   Calcium, Total (PTH) (mg/dL)  Date Value  06/12/2020 8.0   Albumin (g/dL)  Date Value  06/13/2021 1.8 (L)  08/05/2013 2.9 (L)   Phosphorus (mg/dL)  Date Value  06/13/2021 3.7   Sodium (mmol/L)  Date Value  06/13/2021 133 (L)  08/05/2013 133 (L)   Assessment: Jonathan Mcmillan is a 26 y.o. male with medical history including diabetes, CKD, HTN, nicotine dependence admitted on 05/31/2021 with  AKI on CKD, anemia, and fevers/chills/myalgias . Patient suffered cardiac arrest on the morning of 1/19 and was ultimately intubated and transferred to the ICU. Patient extubated on 1/26 and off vasopressors. CRRT discontinued per nephrology. Pharmacy has been consulted to monitor and replace electrolytes.  Goal of Therapy:  Electrolytes within normal limits  Plan:  --Corrected calcium 8.4 mg/dL --No replacement required at this time --Continue to monitor with AM labs tomorrow.   Owens Loffler 06/13/2021 7:05 AM

## 2021-06-13 NOTE — Progress Notes (Signed)
PROGRESS NOTE  Patient: Jonathan Mcmillan 26 y.o. male MRN: [id number]  Today 06/13/21 is hospital day 13 after admission on 05/31/2021  6:35 AM  RECORD REVIEW AND HOSPITAL COURSE: Mr. Buch 26 year old male PMH DM1, CKD 4, HTN, nicotine dependence.  Presented to ED 05/31/2021 3-day history fever, chills, nonproductive cough, myalgia, diarrhea.  Admitted for AKI on CKD.  While on floor 2021/06/17, patient coded hypotensive, hypoglycemic, asystole progressed to PEA, achieved ROSC and transferred to ICU.  See critical care notes for detailed significant Hospital events: Sepsis work-up/treatment, CRRT, tracheal aspirate grew E. coli, extubated 06/03/2021 but needed to be reintubated 06/04/2021 apparently due to large volume aspiration, worsening shock, multiorgan failure, underwent platelet transfusion and went into A. fib RVR, continued to be in severe multiorgan failure as of 06/06/2021, was able to be extubated 06/08/2021.  Underwent several transfusions PRBCs/platelets.  Awaiting vasopressors.  Off CRRT 06/10/2021.  Swallow study passed to dysphagia, on day 3 post extubation medically stable off CRRT. Transfer to hospitalist 06/13/21  Procedures and Significant Results:  ACLS 06/17/21 June 17, 2021 intubation, placement central line, A-line, try Alysis catheter, CRRT initiated 06/03/2021 extubated, reintubated 06/04/2021 due to large volume aspiration, worsening shock.  Consultants:  PCCM Nephrology - AKI Cardiology  -cardiac arrest hematology -thrombocytopenia Neurology -anoxic brain injury status postcardiac arrest Vascular surgeon -hemodialysis catheter     SUBJECTIVE:  Reviewed and examined today at bedside, he is awake/alert.  He is not particularly verbose but he is answering questions appropriately.  He states he feels fine other than a bit of blurred vision which has been stable over the past 3 to 4 days, no visual field deficits, no vision loss, no pain in the eyes although they are  notably reddened.  Denies chest pain, headache, shortness of breath, abdominal pain.      ASSESSMENT & PLAN   Acute hypoxic respiratory failure in setting of Cardiac Arrest, Aspiration Pneumonia & New HFrEF RESOLVED -EXTUBATED 1/26; remains stable on supplemental oxygen -Supplemental O2 as needed to maintain O2 sats >92% -Prn Bronchodilators -Continue ABX as above -Volume removal with HD/CRRT - nephrology following -Ensure pulmonary hygiene -Aspiration precautions, diet advanced today 01/31 -Incentive spirometry as tolerated   Septic shock +/- Cardiogenic Shock-RESOLVED New HFrEF: -Echocardiogram Jun 17, 2021>>LVEF 35-40% and global hypokinesis, grade II diastolic dysfunction, mildly reduced RV function, and moderately increased Pulmonary systolic pressure In-Hospital-Cardiac Arrest due to severe metabolic acidosis and multiple metabolic derangements A. fib RVR 1/23 on amiodarone Mildly elevated troponin due to demand ischemia PMHx: Hypertension Extubated Supportive care Follow up cardiology recs   Severe Sepsis due to Aspiration Pneumonia (E.coli) resolved   Anemia of Chronic Disease Thrombocytopenia (4T score is 3-4) in setting of Sepsis Follow up CBC as needed Continues to have stable normocytic anemia, thrombocytopenia   AKI on CKD Stage IV -Monitor I&O's / urinary output -Follow BMP: This morning creatinine up very slightly from yesterday -Ensure adequate renal perfusion -Avoid nephrotoxic agents as able -Replace electrolytes as indicated -Nephrology following, appreciate input   Now on intermittent hemodialysis.  Continue HD per nephrology   PMHx: DM type I; now with hypoglycemia with blood sugars greater than 250 mg/dL -CBG's q4h; Target range of 140 to 180 -Continue resistant sliding scale and transition to insulin infusion if blood glucose levels remain persistently greater than 250 mg/dL -Follow ICU Hypo/Hyperglycemia protocol  VTE Ppx: SCD, holding chemical  prophylaxis due to thrombocytopenia  CODE STATUS   Code Status: Full Code  Admitted from: home Expected Dispo: TBD Barriers to  discharge: unstable medical condition Family communication: none today               Objective: Vital signs in last 24 hours: Temp:  [93.3 F (34.1 C)-98 F (36.7 C)] 98 F (36.7 C) (01/31 1456) Pulse Rate:  [61-75] 69 (01/31 1456) Resp:  [0-23] 16 (01/31 1456) BP: (96-146)/(64-107) 144/97 (01/31 1456) SpO2:  [95 %-100 %] 100 % (01/31 1456) Weight:  [64.1 kg-64.8 kg] 64.8 kg (01/31 0422) Weight change: 0 kg Last BM Date: 06/13/21  Intake/Output from previous day: 01/30 0701 - 01/31 0700 In: 208.9 [P.O.:120; I.V.:8.9; IV Piggyback:50] Out: 855 [Stool:400] Intake/Output this shift: Total I/O In: 602.2 [P.O.:600; I.V.:2.2] Out: -   General appearance: alert, appears older than stated age, and no distress Eyes: red/injected conjunctivae bilaterally, PERRL, EOMI, no visual field deficits Neck: no adenopathy Resp: clear to auscultation bilaterally Cardio: regular rate and rhythm, S1, S2 normal, no murmur, click, rub or gallop GI: soft, non-tender; bowel sounds normal; no masses,  no organomegaly Extremities: no edema, redness or tenderness in the calves or thighs Skin: very dry/flaky, intact Neurologic: Alert and oriented X 3, normal strength and tone. Normal symmetric reflexes. Normal coordination and gait  Lab Results: Recent Labs    06/12/21 0339 06/13/21 0404  WBC 13.5* 9.9  HGB 8.6* 9.5*  HCT 25.1* 28.3*  PLT 38* 46*   BMET Recent Labs    06/12/21 0339 06/13/21 0404  NA 134* 133*  K 3.8 3.6  CL 104 100  CO2 22 24  GLUCOSE 151* 50*  BUN 69* 56*  CREATININE 2.71* 2.81*  CALCIUM 6.5* 6.6*    Studies/Results: No results found.  Medications: Scheduled:  amiodarone  200 mg Oral BID   vitamin C  500 mg Oral BID   chlorhexidine gluconate (MEDLINE KIT)  15 mL Mouth Rinse BID   Chlorhexidine Gluconate Cloth  6 each  Topical Daily   Chlorhexidine Gluconate Cloth  6 each Topical Q0600   collagenase   Topical Daily   feeding supplement (NEPRO CARB STEADY)  237 mL Oral BID BM   hydrocerin   Topical BID   insulin aspart  0-15 Units Subcutaneous TID WC   insulin glargine-yfgn  5 Units Subcutaneous QHS   mouth rinse  15 mL Mouth Rinse BID   multivitamin  1 tablet Oral QHS   pantoprazole  40 mg Oral Daily   sodium chloride flush  10-40 mL Intracatheter Q12H   thiamine injection  100 mg Intravenous Q24H   zinc sulfate  220 mg Oral Daily   Continuous:  sodium chloride     sodium chloride     sodium chloride Stopped (06/12/21 2313)    ceFAZolin (ANCEF) IV 1 g (06/12/21 2136)   ZJI:RCVELF chloride, sodium chloride, sodium chloride, alteplase, docusate, fentaNYL (SUBLIMAZE) injection, guaiFENesin-dextromethorphan, heparin, ipratropium-albuterol, lidocaine (PF), lidocaine-prilocaine, lip balm, ondansetron **OR** ondansetron (ZOFRAN) IV, oxyCODONE, pentafluoroprop-tetrafluoroeth, polyvinyl alcohol, sodium chloride flush Anti-infectives (From admission, onward)    Start     Dose/Rate Route Frequency Ordered Stop   06/11/21 2200  ceFAZolin (ANCEF) IVPB 1 g/50 mL premix        1 g 100 mL/hr over 30 Minutes Intravenous Every 24 hours 06/11/21 0726     06/08/21 1100  ceFAZolin (ANCEF) IVPB 2g/100 mL premix  Status:  Discontinued        2 g 200 mL/hr over 30 Minutes Intravenous Every 12 hours 06/08/21 1009 06/11/21 0726   06/07/21 1115  ceFEPIme (MAXIPIME) 2 g in sodium  chloride 0.9 % 100 mL IVPB  Status:  Discontinued        2 g 200 mL/hr over 30 Minutes Intravenous Every 8 hours 06/07/21 1017 06/07/21 1020   06/07/21 1115  ceFEPIme (MAXIPIME) 2 g in sodium chloride 0.9 % 100 mL IVPB  Status:  Discontinued        2 g 200 mL/hr over 30 Minutes Intravenous Every 12 hours 06/07/21 1020 06/08/21 1009   06/05/21 2200  Ampicillin-Sulbactam (UNASYN) 3 g in sodium chloride 0.9 % 100 mL IVPB  Status:  Discontinued         3 g 200 mL/hr over 30 Minutes Intravenous Every 8 hours 06/05/21 1019 06/07/21 1017   06/05/21 1000  ceFEPIme (MAXIPIME) 2 g in sodium chloride 0.9 % 100 mL IVPB  Status:  Discontinued        2 g 200 mL/hr over 30 Minutes Intravenous Every 12 hours 06/05/21 0751 06/05/21 1019   06/04/21 2000  metroNIDAZOLE (FLAGYL) IVPB 500 mg  Status:  Discontinued        500 mg 100 mL/hr over 60 Minutes Intravenous Every 8 hours 06/04/21 1858 06/05/21 1019   06/04/21 1900  ceFEPIme (MAXIPIME) 2 g in sodium chloride 0.9 % 100 mL IVPB  Status:  Discontinued        2 g 200 mL/hr over 30 Minutes Intravenous Every 8 hours 06/04/21 1520 06/05/21 0751   06/02/21 1600  vancomycin (VANCOREADY) IVPB 750 mg/150 mL  Status:  Discontinued        750 mg 150 mL/hr over 60 Minutes Intravenous Every 24 hours 06/01/21 1508 06/03/21 1555   06/01/21 2000  piperacillin-tazobactam (ZOSYN) IVPB 3.375 g  Status:  Discontinued        3.375 g 100 mL/hr over 30 Minutes Intravenous Every 6 hours 06/01/21 1509 06/04/21 1520   06/01/21 1600  piperacillin-tazobactam (ZOSYN) IVPB 3.375 g  Status:  Discontinued        3.375 g 100 mL/hr over 30 Minutes Intravenous  Once 06/01/21 1506 06/01/21 1811   06/01/21 1600  vancomycin (VANCOREADY) IVPB 1250 mg/250 mL        1,250 mg 166.7 mL/hr over 90 Minutes Intravenous  Once 06/01/21 1508 06/01/21 1907   06/01/21 0830  cefTRIAXone (ROCEPHIN) 1 g in sodium chloride 0.9 % 100 mL IVPB  Status:  Discontinued        1 g 200 mL/hr over 30 Minutes Intravenous Every 24 hours 06/01/21 0741 06/01/21 1448        LOS: 13 days   Emeterio Reeve 06/13/2021, 3:28 PM

## 2021-06-13 NOTE — Progress Notes (Addendum)
Speech Language Pathology Treatment: Dysphagia  Patient Details Name: Jonathan Mcmillan MRN: [id number] DOB: Aug 04, 1995 Today's Date: 06/13/2021 Time: 9833-8250 SLP Time Calculation (min) (ACUTE ONLY): 20 min  Assessment / Plan / Recommendation Clinical Impression  Pt seen for ongoing assessment of swallowing. He is alert, verbally responsive and answered SLP's questions; flat affect. Pt required repositioning for a more upright positioning d/t tendency to lean.  Pt explained general aspiration precautions and nodded to the need for following them, though not overly engaged in the discussion. He had been supported behind the back for more upright sitting. Pt had consumed [employer] nuggets and food brought in for him and thin liquids. No overt clinical s/s of aspiration were noted or reported w/ any consistency; respiratory status remained calm and unlabored, no coughing. Oral phase appeared [facility] for bolus management and adequate A-P transfer timing for swallowing; oral clearing achieved w/ all consistencies. NSG denied any deficits in swallowing as well. It was pt's choice of foods. WBC wnl; afebrile. Breath sounds CTAB per MD note. Pt moving to floor now.   Pt appears at reduced risk for aspriation when following general aspiration precautions. Recommend a Regular foods w/ gravies added to moisten foods as needed; Thin liquids. Recommend general aspiration precautions; Pills Whole in Puree, or Crushed if pt's wish; tray setup and positioning assistance for meals. ST services will sign off at this time w/ MD to reconsult if needed while admitted. NSG updated. Precautions posted at bedside.       HPI HPI: Per admitting H&P " Jonathan Mcmillan is a 26 y.o. male with medical history significant for diabetes mellitus with complications of stage IV chronic kidney disease, hypertension, nicotine dependence who presents to the emergency room for evaluation of a 3-day history of fever, chills, nonproductive  cough, myalgias and diarrhea.  He denies having any sick contacts.  He denies having any urinary frequency, nocturia or dysuria and denies having any abdominal pain.  He denies having a headache, no sore throat, no chest pain, no shortness of breath, no back pain, no lower extremity swelling, no focal deficits of blurred vision.  He denies having any hematemesis, no melena stools or hematochezia and denies NSAID use.  Upon arrival to the ER he was noted to have a T-max of 102.6, he was tachycardic and tachypneic.   Sodium 131, potassium 4.4, chloride 102, bicarb 15, glucose 177, BUN 19, creatinine 13.86 when compared to baseline of 3.92, calcium 6.7, alkaline phosphatase 70, albumin 2.9, lipase 29, AST 24, ALT 33, total protein 6.3, lactic acid 2.1, white count 12.7, hemoglobin 5.2 compared to baseline of 8.3, hematocrit 15.8, MCV 89.8, RDW 12.8, platelet count 172, PT 15.8, INR 1.3  Respiratory viral panel is negative  Urine analysis shows proteinuria  Chest x-ray reviewed by me shows no evidence of acute cardiopulmonary disease".      SLP Plan  All goals met      Recommendations for follow up therapy are one component of a multi-disciplinary discharge planning process, led by the attending physician.  Recommendations may be updated based on patient status, additional functional criteria and insurance authorization.    Recommendations  Diet recommendations: Regular;Thin liquid Liquids provided via: Cup;Straw Medication Administration: Whole meds with puree (he asked the Nurse to Crush this morning, so both ways were done. Whichever offers easier swallowing) Supervision: Patient able to self feed;Staff to assist with self feeding;Intermittent supervision to cue for compensatory strategies (as needed) Compensations: Slow rate;Small sips/bites;Follow solids with liquid;Minimize environmental  distractions Postural Changes and/or Swallow Maneuvers: Out of bed for meals;Seated upright 90 degrees;Upright  30-60 min after meal                General recommendations:  (Dietician f/u if needed) Oral Care Recommendations: Oral care BID;Oral care before and after PO;Patient independent with oral care (support as needed) Follow Up Recommendations: No SLP follow up Assistance recommended at discharge: Intermittent Supervision/Assistance SLP Visit Diagnosis: Dysphagia, unspecified (R13.10) Plan: All goals met             Orinda Kenner, [place], [place]; [place] [phone] (ascom) Christie Viscomi  06/13/2021, 5:13 PM

## 2021-06-13 NOTE — Progress Notes (Signed)
Inpatient Diabetes Program Recommendations  AACE/ADA: New Consensus Statement on Inpatient Glycemic Control (2015)  Target Ranges:  Prepandial:   less than 140 mg/dL      Peak postprandial:   less than 180 mg/dL (1-2 hours)      Critically ill patients:  140 - 180 mg/dL    Latest Reference Range & Units 06/12/21 23:16 06/13/21 03:41 06/13/21 03:45 06/13/21 04:08 06/13/21 04:09 06/13/21 04:11 06/13/21 04:42  Glucose-Capillary 70 - 99 mg/dL 83 50 (L) 53 (L) <10 (LL) 40 (LL) 56 (L) 154 (H)     Home DM Meds: NOT taking Lantus  Current Orders: Semglee 5 units QHS      Novolog 3-6-9 units Q4 hours      MD- Note Hypoglycemia this AM.  Note that Semglee was reduced to 5 units QHS for tonight.  Agree with reduction.  Please also consider reducing the Novolog SSI to the Sensitive scale (0-9 units) Q4 hours per the Glycemic Control Order set    --Will follow patient during hospitalization--  Wyn Quaker RN, MSN, CDE Diabetes Coordinator Inpatient Glycemic Control Team Team Pager: [phone] (8a-5p)

## 2021-06-14 ENCOUNTER — Encounter: Payer: Self-pay | Admitting: Internal Medicine

## 2021-06-14 DIAGNOSIS — R778 Other specified abnormalities of plasma proteins: Secondary | ICD-10-CM | POA: Insufficient documentation

## 2021-06-14 DIAGNOSIS — A419 Sepsis, unspecified organism: Secondary | ICD-10-CM

## 2021-06-14 DIAGNOSIS — I480 Paroxysmal atrial fibrillation: Secondary | ICD-10-CM

## 2021-06-14 DIAGNOSIS — R6521 Severe sepsis with septic shock: Secondary | ICD-10-CM

## 2021-06-14 DIAGNOSIS — I5042 Chronic combined systolic (congestive) and diastolic (congestive) heart failure: Secondary | ICD-10-CM

## 2021-06-14 DIAGNOSIS — R7989 Other specified abnormal findings of blood chemistry: Secondary | ICD-10-CM | POA: Insufficient documentation

## 2021-06-14 DIAGNOSIS — L89302 Pressure ulcer of unspecified buttock, stage 2: Secondary | ICD-10-CM | POA: Diagnosis present

## 2021-06-14 LAB — HEPATITIS B CORE ANTIBODY, TOTAL: Hep B Core Total Ab: NONREACTIVE

## 2021-06-14 LAB — CBC
HCT: 26.9 % — ABNORMAL LOW (ref 39.0–52.0)
Hemoglobin: 9.2 g/dL — ABNORMAL LOW (ref 13.0–17.0)
MCH: 29.9 pg (ref 26.0–34.0)
MCHC: 34.2 g/dL (ref 30.0–36.0)
MCV: 87.3 fL (ref 80.0–100.0)
Platelets: 50 10*3/uL — ABNORMAL LOW (ref 150–400)
RBC: 3.08 MIL/uL — ABNORMAL LOW (ref 4.22–5.81)
RDW: 15.6 % — ABNORMAL HIGH (ref 11.5–15.5)
WBC: 9.3 10*3/uL (ref 4.0–10.5)
nRBC: 0 % (ref 0.0–0.2)

## 2021-06-14 LAB — RENAL FUNCTION PANEL
Albumin: 1.7 g/dL — ABNORMAL LOW (ref 3.5–5.0)
Anion gap: 10 (ref 5–15)
BUN: 68 mg/dL — ABNORMAL HIGH (ref 6–20)
CO2: 22 mmol/L (ref 22–32)
Calcium: 6.3 mg/dL — CL (ref 8.9–10.3)
Chloride: 98 mmol/L (ref 98–111)
Creatinine, Ser: 3.54 mg/dL — ABNORMAL HIGH (ref 0.61–1.24)
GFR, Estimated: 23 mL/min — ABNORMAL LOW (ref >60)
Glucose, Bld: 267 mg/dL — ABNORMAL HIGH (ref 70–99)
Phosphorus: 4.3 mg/dL (ref 2.5–4.6)
Potassium: 4.3 mmol/L (ref 3.5–5.1)
Sodium: 130 mmol/L — ABNORMAL LOW (ref 135–145)

## 2021-06-14 LAB — GLUCOSE, CAPILLARY
Glucose-Capillary: 272 mg/dL — ABNORMAL HIGH (ref 70–99)
Glucose-Capillary: 236 mg/dL — ABNORMAL HIGH (ref 70–99)
Glucose-Capillary: 97 mg/dL (ref 70–99)
Glucose-Capillary: 131 mg/dL — ABNORMAL HIGH (ref 70–99)
Glucose-Capillary: 135 mg/dL — ABNORMAL HIGH (ref 70–99)
Glucose-Capillary: 122 mg/dL — ABNORMAL HIGH (ref 70–99)

## 2021-06-14 LAB — MAGNESIUM: Magnesium: 2.6 mg/dL — ABNORMAL HIGH (ref 1.7–2.4)

## 2021-06-14 MED ORDER — CEFAZOLIN SODIUM-DEXTROSE 2-4 GM/100ML-% IV SOLN
2.0000 g | INTRAVENOUS | Status: AC
  Filled 2021-06-14: qty 100

## 2021-06-14 MED ORDER — CALCIUM GLUCONATE-NACL 1-0.675 GM/50ML-% IV SOLN
1.0000 g | Freq: Once | INTRAVENOUS | Status: AC
  Administered 2021-06-14: 1000 mg via INTRAVENOUS
  Filled 2021-06-14: qty 50

## 2021-06-14 MED ORDER — HEPARIN SODIUM (PORCINE) 1000 UNIT/ML IJ SOLN
INTRAMUSCULAR | Status: AC
  Filled 2021-06-14: qty 10

## 2021-06-14 NOTE — Progress Notes (Signed)
Physical Therapy Treatment Patient Details Name: Jonathan Mcmillan MRN: [id number] DOB: [birth year] Today's Date: 06/14/2021   History of Present Illness Pt is a 26 y/o M admitted on 06/01/21 with AKI on CKD in setting of viral syndrome & diarrhea. Pt suffered in-hospital cardiac arrest, suspect due to severe metabolic acidosis & multiple metabolic derangements. Required CRRT and now preparing to undergo regular HD with R temp femoral catheter placed on 1/28. Pt was extubated on 1/21 but reintubated 1/22. Extubated again on 1/26 & now weaned to room air. PMH: DM2, CKD stage IV, HTN, nicotine dependence.    PT Comments    Pt was pleasant and motivated to participate during the session and put forth good effort throughout. Pt limited to bed level activity per MD secondary to R temp fem cath.  Pt performed below therex without adverse symptoms and is motivated to participate with OOB activity once medically appropriate.  Pt will benefit from PT services in an IR setting upon discharge to safely address deficits listed in patient problem list for decreased caregiver assistance and eventual return to PLOF.     Recommendations for follow up therapy are one component of a multi-disciplinary discharge planning process, led by the attending physician.  Recommendations may be updated based on patient status, additional functional criteria and insurance authorization.  Follow Up Recommendations  Acute inpatient rehab (3hours/day)     Assistance Recommended at Discharge Frequent or constant Supervision/Assistance  Patient can return home with the following Two people to help with walking and/or transfers;Two people to help with bathing/dressing/bathroom;Direct supervision/assist for medications management;Help with stairs or ramp for entrance;Assistance with feeding;Assist for transportation;Assistance with cooking/housework;Direct supervision/assist for financial management   Equipment Recommendations  None  recommended by PT    Recommendations for Other Services       Precautions / Restrictions Precautions Precautions: Fall Precaution Comments: R temporary femoral dialysis catheter, per Dr. Ulice Bold, not to get OOB with therapy at this time. Restrictions Weight Bearing Restrictions: No     Mobility  Bed Mobility               General bed mobility comments: NT secondary to R temp fem cath, no OOB activity per MD    Transfers                        Ambulation/Gait                   Stairs             Wheelchair Mobility    Modified Rankin (Stroke Patients Only)       Balance                                            Cognition Arousal/Alertness: Awake/alert Behavior During Therapy: Flat affect Overall Cognitive Status: Within Functional Limits for tasks assessed                                          Exercises Total Joint Exercises Ankle Circles/Pumps: AROM, Strengthening, Both, 5 reps, 10 reps (gentle manual resistance) Quad Sets: Strengthening, Both, 5 reps, 10 reps Gluteal Sets: Strengthening, Both, 10 reps Short Arc Quad: AROM, Strengthening, Both, 10 reps Hip ABduction/ADduction: AROM, AAROM, Strengthening,  Left, 10 reps Straight Leg Raises: AROM, AAROM, Strengthening, Left, 10 reps Other Exercises Other Exercises: HEP education for BLE APs, QS, and GS x 10 each every 1-2 hours daily    General Comments        Pertinent Vitals/Pain Pain Assessment Pain Assessment: 0-10 Pain Score: 7  Pain Location: Neck Pain Descriptors / Indicators: Sore Pain Intervention(s): Premedicated before session, Monitored during session    Home Living                          Prior Function            PT Goals (current goals can now be found in the care plan section) Progress towards PT goals: Not progressing toward goals - comment (No OOB activity per MD)    Frequency    Min  2X/week      PT Plan Current plan remains appropriate    Co-evaluation              AM-PAC PT "6 Clicks" Mobility   Outcome Measure  Help needed turning from your back to your side while in a flat bed without using bedrails?: A Lot Help needed moving from lying on your back to sitting on the side of a flat bed without using bedrails?: Total Help needed moving to and from a bed to a chair (including a wheelchair)?: Total Help needed standing up from a chair using your arms (e.g., wheelchair or bedside chair)?: Total Help needed to walk in hospital room?: Total Help needed climbing 3-5 steps with a railing? : Total 6 Click Score: 7    End of Session   Activity Tolerance: Patient tolerated treatment well Patient left: in bed;with bed alarm set;with call bell/phone within reach Nurse Communication: Mobility status PT Visit Diagnosis: Difficulty in walking, not elsewhere classified (R26.2);Muscle weakness (generalized) (M62.81)     Time: 0737-1062 PT Time Calculation (min) (ACUTE ONLY): 12 min  Charges:  $Therapeutic Exercise: 8-22 mins                    D. Scott Shondra Capps PT, DPT 06/14/21, 3:05 PM

## 2021-06-14 NOTE — Progress Notes (Signed)
Progress Note   Patient: Jonathan Mcmillan PPI:[id number] DOB: 06/09/1995 DOA: 05/31/2021     14 DOS: the patient was seen and examined on 06/14/2021       Brief hospital course: [name] is a 88 M with hx T1DM, CKD IV baseline Cr 1.8-2.2, HTN, smoking, who presented with 3d history fever, chills, nonproductive cough, myalgia, diarrhea, admitted for worsening renal function.  On hospital day 2, patient became hypotensive, hypoglycemic had CODE BLUE asystole progressed to PEA, achieved ROSC and transferred to ICU.    Started treatment for sepsis, tracheal aspirate grew E. coli, started on CRRT, extubated 06/03/2021 but needed to be reintubated 06/04/2021 apparently due to large volume aspiration, worsening shock, multiorgan failure, and severe thrombocytopenia (not DIC).   Underwent platelet transfusion and went into A. fib RVR, was able to be extubated 06/08/2021.  Underwent several transfusions PRBCs/platelets.  Off CRRT 06/10/2021.    Swallow study passed to dysphagia, on day 3 post extubation medically stable off CRRT. Transfer to hospitalist 06/13/21  Please see further details in Dr. Redgie Mcmillan note from 1/31       Assessment and Plan: * Septic shock due to Ecoli pneumonia Completed 4 days Zosyn then 3 days Unasyn then 6 days Cefazolin.  Blood cultures on 1/18, 1/19, 1/22 negative. Sputum 1/22 with Ecoli  Now off Abx.  - Monitor fever curve    Acute kidney injury superimposed on CKD (HCC) Chronic kidney disease stage IV Baseline Cr 1.8-2.2 due to diabetes.  Here, Renal function worsened, required CRRT, since stopping CRRT, renal function has not recovered.    Nephrology recommend starting HD today. - Consult nephrology    Chronic combined systolic and diastolic CHF (congestive heart failure) ([place]) Echo this hospitalization showed EF 35-40%.   - Fluid management per HD    Acute respiratory failure with hypoxia (HCC) Resolved  Paroxysmal atrial fibrillation  (HCC) Not on AC due to thrombocytopenia -Continue amiodarone  Anemia in chronic kidney disease, on chronic dialysis (HCC) Hgb stable, no change  Thrombocytopenia (HCC) Plts stable at 50K  Type 1 diabetes mellitus with complication (HCC) Glucose somewhat labile -Continue glargine -Continue SS corrections  Pressure injury RIGHT buttocks, stage 2, POA         Subjective: Patient is depressed.  No fever, cough, dyspnea, pain.  Physical Exam: Vitals:   06/14/21 1218 06/14/21 1245 06/14/21 1323 06/14/21 1554  BP: 111/66 137/81 (!) 152/89 136/87  Pulse:  75 80 75  Resp: 17 17 18 18   Temp: 97.9 F (36.6 C)  97.7 F (36.5 C) (!) 97.4 F (36.3 C)  TempSrc: Oral  Oral Oral  SpO2:   100% 100%  Weight: 64.7 kg     Height:       Very thin adult male, lying in bed, getting dialysis. Skin has diffuse sloughing/peeling.  No blisters.  No rash per se. Heart rate fast, murmur noted, no peripheral edema, no JVD Lung sounds clear, no rales or wheezing Abdomen scaphoid, no focal tenderness palpation or guarding Attention blunted, affect sad, judgment insight appear normal     Data Reviewed: Discussed with nephrology Lab studies are notable for sodium down to 130, glucose labile, creatinine up to 3.54 from 2.8 yesterday, magnesium 2.6 Hemogram notable for platelets 50, slightly up from previous, hemoglobin 9.2, no change   Family Communication:   Disposition: Status is: Inpatient Remains inpatient appropriate because: Patient will will have to start dialysis.  Potentially this will be a permanent thing and he will need  outpatient dialysis placement.  Disposition at this time is pending nephrology decision regarding long-term dialysis plans          Planned Discharge Destination: Home      Author: Edwin Dada, MD 06/14/2021 4:57 PM  For on call review [email or web address].

## 2021-06-14 NOTE — Assessment & Plan Note (Addendum)
--   Secondary to aspiration pneumonia, treated with antibiotics, now resolved.

## 2021-06-14 NOTE — Assessment & Plan Note (Addendum)
--  Baseline Cr 1.8-2.2 due to diabetes.  Renal function worsened progressing to need for CRRT.  Did not recover, diagnosed with ESRD and started on hemodialysis. --Continue management per nephrology.

## 2021-06-14 NOTE — Assessment & Plan Note (Addendum)
--   Continue hemodialysis per nephrology.  New diagnosis this admission.

## 2021-06-14 NOTE — Assessment & Plan Note (Addendum)
--   CBG stable, continue sliding scale insulin and long-acting insulin.

## 2021-06-14 NOTE — Assessment & Plan Note (Addendum)
--   New diagnosis this admission, followed by cardiology earlier in hospitalization, started on amiodarone.  No anticoagulation secondary to thrombocytopenia.

## 2021-06-14 NOTE — Progress Notes (Signed)
Occupational Therapy Treatment Patient Details Name: Jonathan Mcmillan MRN: [id number] DOB: June 05, 1995 Today's Date: 06/14/2021   History of present illness Pt is a 26 y/o M admitted on 06/01/21 with AKI on CKD in setting of viral syndrome & diarrhea. Pt suffered in-hospital cardiac arrest, suspect due to severe metabolic acidosis & multiple metabolic derangements. Required CRRT and now preparing to undergo regular HD with R temp femoral catheter placed on 1/28. Pt was extubated on 1/21 but reintubated 1/22. Extubated again on 1/26 & now weaned to room air. PMH: DM2, CKD stage IV, HTN, nicotine dependence.   OT comments  Jonathan Mcmillan was seen for OT treatment on this date. Upon arrival to room pt reclined in bed, requesting assistance to adjust position. MOD A to scoot higher, cues for use of head board rail. Pt completed bed level BUE therex using yellow theraband with improved participation. Pt making progress toward goals, anticipate progression to OOB pending placement of permanent HD (tentatively planned for next date), pt aware and eager. Pt continues to benefit from skilled OT services to maximize return to PLOF and minimize risk of future falls, injury, caregiver burden, and readmission. Will continue to follow POC. Discharge recommendation remains appropriate.     Recommendations for follow up therapy are one component of a multi-disciplinary discharge planning process, led by the attending physician.  Recommendations may be updated based on patient status, additional functional criteria and insurance authorization.    Follow Up Recommendations  Acute inpatient rehab (3hours/day)    Assistance Recommended at Discharge Frequent or constant Supervision/Assistance  Patient can return home with the following  A lot of help with walking and/or transfers;A lot of help with bathing/dressing/bathroom;Assistance with cooking/housework;Direct supervision/assist for medications management;Assist for  transportation;Help with stairs or ramp for entrance   Equipment Recommendations  Other (comment) (defer)    Recommendations for Other Services      Precautions / Restrictions Precautions Precautions: Fall Precaution Comments: R temporary femoral dialysis catheter, per Dr. Ulice Bold, not to get OOB with therapy at this time. Restrictions Weight Bearing Restrictions: No       Mobility Bed Mobility               General bed mobility comments: MOD A scoot higher in bed - cues for head board rail use    Transfers                   General transfer comment: deferred-temporary femoral catheter for HD     Balance                                           ADL either performed or assessed with clinical judgement   ADL Overall ADL's : Needs assistance/impaired                                       General ADL Comments: MOD A to scoot higher in bed. SETUP self-drinking      Cognition Arousal/Alertness: Awake/alert Behavior During Therapy: Flat affect Overall Cognitive Status: Within Functional Limits for tasks assessed  Exercises Exercises: General Upper Extremity General Exercises - Upper Extremity Shoulder Flexion: AROM, Strengthening, Both, 10 reps, Supine, Theraband Theraband Level (Shoulder Flexion): Level 1 (Yellow) Shoulder ABduction: AROM, Strengthening, Both, 10 reps, Supine, Theraband Theraband Level (Shoulder Abduction): Level 1 (Yellow) Shoulder ADduction: AROM, Strengthening, Both, 10 reps, Supine, Theraband Theraband Level (Shoulder Adduction): Level 1 (Yellow) Shoulder Horizontal ABduction: AROM, Strengthening, Both, 10 reps, Supine, Theraband Theraband Level (Shoulder Horizontal Abduction): Level 1 (Yellow) Elbow Flexion: AROM, Strengthening, Both, 10 reps, Supine, Theraband Theraband Level (Elbow Flexion): Level 1 (Yellow) Elbow Extension:  AROM, Strengthening, Both, 10 reps, Supine, Theraband Theraband Level (Elbow Extension): Level 1 (Yellow)            Pertinent Vitals/ Pain       Pain Assessment Pain Assessment: No/denies pain   Frequency  Min 3X/week        Progress Toward Goals  OT Goals(current goals can now be found in the care plan section)  Progress towards OT goals: Progressing toward goals  Acute Rehab OT Goals Patient Stated Goal: to get out of bed OT Goal Formulation: With patient Time For Goal Achievement: 06/24/21 Potential to Achieve Goals: Fair ADL Goals Pt Will Perform Eating: with set-up Pt Will Perform Grooming: with set-up Pt Will Perform Upper Body Bathing: with min guard assist Pt/caregiver will Perform Home Exercise Program: Increased strength;Both right and left upper extremity;With minimal assist Additional ADL Goal #1: Pt will tolerate further mobilization (once cleared by nephrology; at least sup to sit with MAX A) to allow for further development of OT POC.  Plan Discharge plan remains appropriate;Frequency remains appropriate    Co-evaluation                 AM-PAC OT "6 Clicks" Daily Activity     Outcome Measure   Help from another person eating meals?: A Little Help from another person taking care of personal grooming?: A Little Help from another person toileting, which includes using toliet, bedpan, or urinal?: A Lot Help from another person bathing (including washing, rinsing, drying)?: A Lot Help from another person to put on and taking off regular upper body clothing?: A Little Help from another person to put on and taking off regular lower body clothing?: A Lot 6 Click Score: 15    End of Session    OT Visit Diagnosis: Muscle weakness (generalized) (M62.81);Adult, failure to thrive (R62.7)   Activity Tolerance Patient tolerated treatment well   Patient Left in bed;with call bell/phone within reach;with bed alarm set   Nurse Communication           Time: 1610-9604 OT Time Calculation (min): 11 min  Charges: OT General Charges $OT Visit: 1 Visit OT Treatments $Therapeutic Exercise: 8-22 mins  Dessie Coma, M.S. OTR/L  06/14/21, 3:33 PM  ascom [phone]

## 2021-06-14 NOTE — Assessment & Plan Note (Addendum)
--   New diagnosis this admission.  Cardiology felt to be nonischemic based on age, no further evaluation was suggested.  Continue amiodarone.  Fluid management per dialysis. -- Add goal-directed therapy

## 2021-06-14 NOTE — Assessment & Plan Note (Addendum)
--   Thorough evaluation per hematology, conclusion: Sepsis as cause.  Platelets stable.  Monitor.

## 2021-06-14 NOTE — Progress Notes (Signed)
Patient CVC functioning poorly with elevated arterial pressures, arterial and venous lines reversed, maintaining prescribed BFR, measurement within safe parameters. Patient alert , voices no concerns.

## 2021-06-14 NOTE — Assessment & Plan Note (Signed)
-   Supp Ca - Check ionized Ca

## 2021-06-14 NOTE — Progress Notes (Addendum)
[facility]  ROUNDING NOTE   Subjective:   Patient seen and evaluated during dialysis   HEMODIALYSIS FLOWSHEET:  Blood Flow Rate (mL/min): 250 mL/min Arterial Pressure (mmHg): -80 mmHg Venous Pressure (mmHg): 70 mmHg Transmembrane Pressure (mmHg): 50 mmHg Ultrafiltration Rate (mL/min): 400 mL/min Dialysate Flow Rate (mL/min): 500 ml/min Conductivity: Machine : 13.7 Conductivity: Machine : 13.7 Dialysis Fluid Bolus: Normal Saline Bolus Amount (mL): 250 mL  No complaints at this time  Objective:  Vital signs in last 24 hours:  Temp:  [97.5 F (36.4 C)-98 F (36.7 C)] 97.9 F (36.6 C) (02/01 1218) Pulse Rate:  [63-246] 246 (02/01 1130) Resp:  [12-22] 17 (02/01 1218) BP: (94-144)/(58-97) 111/66 (02/01 1218) SpO2:  [100 %] 100 % (02/01 1015) Weight:  [63.7 kg-64.7 kg] 64.7 kg (02/01 1218)  Weight change: -1 kg Filed Weights   06/14/21 0500 06/14/21 0941 06/14/21 1218  Weight: 63.7 kg 63.7 kg 64.7 kg    Intake/Output: I/O last 3 completed shifts: In: 691.1 [P.O.:600; I.V.:11.1; Other:30; IV Piggyback:50] Out: 655 [Other:455; Stool:200]   Intake/Output this shift:  Total I/O In: -  Out: 500 [Other:500]  Physical Exam: General: No acute distress  Eyes: Anicteric  Lungs:  Basilar crackles 160, normal effort  Heart: S1S2, no rubs  Abdomen:  Soft, NTND, BS present  Extremities: Trace peripheral edema.  Neurologic: Awake, alert, no rbus  Skin: Peeling skin noted  Access:  Right femoral temp cath  Rectal tube, external catheter procedure  Basic Metabolic Panel: Recent Labs  Lab 06/10/21 0500 06/11/21 0334 06/12/21 0339 06/13/21 0404 06/14/21 0346  NA 134* 135 134* 133* 130*  K 3.8 3.3* 3.8 3.6 4.3  CL 103 104 104 100 98  CO2 24 23 22 24 22   GLUCOSE 161* 164* 151* 50* 267*  BUN 36* 54* 69* 56* 68*  CREATININE 1.75* 2.30* 2.71* 2.81* 3.54*  CALCIUM 6.7* 6.8* 6.5* 6.6* 6.3*  MG 2.5* 2.7* 2.8* 2.4 2.6*  PHOS 3.0 3.2 4.4 3.7 4.3     Liver  Function Tests: Recent Labs  Lab 06/09/21 0411 06/09/21 1600 06/10/21 0500 06/11/21 0334 06/12/21 0339 06/13/21 0404 06/14/21 0346  AST 25  --   --   --  16  --   --   ALT 29  --   --   --  6  --   --   ALKPHOS 194*  --   --   --  158*  --   --   BILITOT 2.7*  --   --   --  1.3*  --   --   PROT 4.8*  --   --   --  5.0*  --   --   ALBUMIN 1.6*   1.7*   < > 1.7* 1.8* 1.7* 1.8* 1.7*   < > = values in this interval not displayed.    Recent Labs  Lab 06/08/21 0407  LIPASE 16    No results for input(s): AMMONIA in the last 168 hours.   CBC: Recent Labs  Lab 06/07/21 2004 06/08/21 0407 06/10/21 0500 06/11/21 0334 06/12/21 0339 06/13/21 0404 06/14/21 0346  WBC 34.2*   < > 22.7* 18.7* 13.5* 9.9 9.3  NEUTROABS 26.5*  --   --   --   --   --   --   HGB 7.3*   < > 7.7* 8.1* 8.6* 9.5* 9.2*  HCT 20.9*   < > 21.7* 23.9* 25.1* 28.3* 26.9*  MCV 85.3   < > 84.1  87.2 85.7 87.6 87.3  PLT 21*   < > 33* 35* 38* 46* 50*   < > = values in this interval not displayed.     Cardiac Enzymes: No results for input(s): CKTOTAL, CKMB, CKMBINDEX, TROPONINI in the last 168 hours.   BNP: Invalid input(s): POCBNP  CBG: Recent Labs  Lab 06/13/21 1957 06/13/21 2141 06/13/21 2342 06/14/21 0341 06/14/21 0810  GLUCAP 108* 179* 234* 272* 236*     Microbiology: Results for orders placed or performed during the hospital encounter of 05/31/21  Resp Panel by RT-PCR (Flu A&B, Covid) Nasopharyngeal Swab     Status: None   Collection Time: 05/31/21  6:58 AM   Specimen: Nasopharyngeal Swab; Nasopharyngeal(NP) swabs in vial transport medium  Result Value Ref Range Status   SARS Coronavirus 2 by RT PCR NEGATIVE NEGATIVE Final    Comment: (NOTE) SARS-CoV-2 target nucleic acids are NOT DETECTED.  The SARS-CoV-2 RNA is generally detectable in upper respiratory specimens during the acute phase of infection. The lowest concentration of SARS-CoV-2 viral copies this assay can detect is 138  copies/mL. A negative result does not preclude SARS-Cov-2 infection and should not be used as the sole basis for treatment or other patient management decisions. A negative result may occur with  improper specimen collection/handling, submission of specimen other than nasopharyngeal swab, presence of viral mutation(s) within the areas targeted by this assay, and inadequate number of viral copies(<138 copies/mL). A negative result must be combined with clinical observations, patient history, and epidemiological information. The expected result is Negative.  Fact Sheet for Patients:  EntrepreneurPulse.com.au  Fact Sheet for Healthcare Providers:  IncredibleEmployment.be  This test is no t yet approved or cleared by the [place] FDA and  has been authorized for detection and/or diagnosis of SARS-CoV-2 by FDA under an Emergency Use Authorization (EUA). This EUA will remain  in effect (meaning this test can be used) for the duration of the COVID-19 declaration under Section 564(b)(1) of the Act, 21 U.S.C.section 360bbb-3(b)(1), unless the authorization is terminated  or revoked sooner.       Influenza A by PCR NEGATIVE NEGATIVE Final   Influenza B by PCR NEGATIVE NEGATIVE Final    Comment: (NOTE) The Xpert Xpress SARS-CoV-2/FLU/RSV plus assay is intended as an aid in the diagnosis of influenza from Nasopharyngeal swab specimens and should not be used as a sole basis for treatment. Nasal washings and aspirates are unacceptable for Xpert Xpress SARS-CoV-2/FLU/RSV testing.  Fact Sheet for Patients: EntrepreneurPulse.com.au  Fact Sheet for Healthcare Providers: IncredibleEmployment.be  This test is not yet approved or cleared by the [place] FDA and has been authorized for detection and/or diagnosis of SARS-CoV-2 by FDA under an Emergency Use Authorization (EUA). This EUA will remain in effect (meaning  this test can be used) for the duration of the COVID-19 declaration under Section 564(b)(1) of the Act, 21 U.S.C. section 360bbb-3(b)(1), unless the authorization is terminated or revoked.  Performed at [facility], [place], [place] [address]   Respiratory (~20 pathogens) panel by PCR     Status: None   Collection Time: 05/31/21  6:58 AM   Specimen: Nasopharyngeal Swab; Respiratory  Result Value Ref Range Status   Adenovirus NOT DETECTED NOT DETECTED Final   Coronavirus 229E NOT DETECTED NOT DETECTED Final    Comment: (NOTE) The Coronavirus on the Respiratory Panel, DOES NOT test for the novel  Coronavirus (2019 nCoV)    Coronavirus HKU1 NOT DETECTED NOT DETECTED Final  Coronavirus NL63 NOT DETECTED NOT DETECTED Final   Coronavirus OC43 NOT DETECTED NOT DETECTED Final   Metapneumovirus NOT DETECTED NOT DETECTED Final   Rhinovirus / Enterovirus NOT DETECTED NOT DETECTED Final   Influenza A NOT DETECTED NOT DETECTED Final   Influenza B NOT DETECTED NOT DETECTED Final   Parainfluenza Virus 1 NOT DETECTED NOT DETECTED Final   Parainfluenza Virus 2 NOT DETECTED NOT DETECTED Final   Parainfluenza Virus 3 NOT DETECTED NOT DETECTED Final   Parainfluenza Virus 4 NOT DETECTED NOT DETECTED Final   Respiratory Syncytial Virus NOT DETECTED NOT DETECTED Final   Bordetella pertussis NOT DETECTED NOT DETECTED Final   Bordetella Parapertussis NOT DETECTED NOT DETECTED Final   Chlamydophila pneumoniae NOT DETECTED NOT DETECTED Final   Mycoplasma pneumoniae NOT DETECTED NOT DETECTED Final    Comment: Performed at [facility], [place] [address] [place], [place] [address]  Group A Strep by PCR [facility] Only)     Status: None   Collection Time: 05/31/21  8:35 AM   Specimen: Throat; Sterile Swab  Result Value Ref Range Status   Group A Strep by PCR NOT DETECTED NOT DETECTED Final    Comment: Performed at [facility], [address] [place], [place] [address]   Blood culture (routine x 2)     Status: None   Collection Time: 05/31/21  8:39 AM   Specimen: BLOOD  Result Value Ref Range Status   Specimen Description BLOOD BLOOD RIGHT FOREARM  Final   Special Requests   Final    BOTTLES DRAWN AEROBIC AND ANAEROBIC Blood Culture results may not be optimal due to an excessive volume of blood received in culture bottles   Culture   Final    NO GROWTH 5 DAYS Performed at [facility], [address] [place], [place] [address]    Report Status 06/05/2021 FINAL  Final  Blood culture (routine x 2)     Status: None   Collection Time: 05/31/21  8:39 AM   Specimen: BLOOD  Result Value Ref Range Status   Specimen Description BLOOD RIGHT ANTECUBITAL  Final   Special Requests   Final    BOTTLES DRAWN AEROBIC AND ANAEROBIC Blood Culture results may not be optimal due to an excessive volume of blood received in culture bottles   Culture   Final    NO GROWTH 5 DAYS Performed at [facility], [address], [place], [place] [address]    Report Status 06/05/2021 FINAL  Final  C Difficile Quick Screen w PCR reflex     Status: None   Collection Time: 05/31/21  1:20 PM   Specimen: Stool  Result Value Ref Range Status   C Diff antigen NEGATIVE NEGATIVE Final   C Diff toxin NEGATIVE NEGATIVE Final   C Diff interpretation No C. difficile detected.  Final    Comment: Performed at [facility], [address] [place],  [address]  Gastrointestinal Panel by PCR , Stool     Status: None   Collection Time: 05/31/21  1:30 PM   Specimen: Stool  Result Value Ref Range Status   Campylobacter species NOT DETECTED NOT DETECTED Final   Plesimonas shigelloides NOT DETECTED NOT DETECTED Final   Salmonella species NOT DETECTED NOT DETECTED Final   Yersinia enterocolitica NOT DETECTED NOT DETECTED Final   Vibrio species NOT DETECTED NOT DETECTED Final   Vibrio cholerae NOT DETECTED NOT DETECTED Final   Enteroaggregative E coli (EAEC)  NOT DETECTED  NOT DETECTED Final   Enteropathogenic E coli (EPEC) NOT DETECTED NOT DETECTED Final   Enterotoxigenic E coli (ETEC) NOT DETECTED NOT DETECTED Final   Shiga like toxin producing E coli (STEC) NOT DETECTED NOT DETECTED Final   Shigella/Enteroinvasive E coli (EIEC) NOT DETECTED NOT DETECTED Final   Cryptosporidium NOT DETECTED NOT DETECTED Final   Cyclospora cayetanensis NOT DETECTED NOT DETECTED Final   Entamoeba histolytica NOT DETECTED NOT DETECTED Final   Giardia lamblia NOT DETECTED NOT DETECTED Final   Adenovirus F40/41 NOT DETECTED NOT DETECTED Final   Astrovirus NOT DETECTED NOT DETECTED Final   Norovirus GI/GII NOT DETECTED NOT DETECTED Final   Rotavirus A NOT DETECTED NOT DETECTED Final   Sapovirus (I, II, IV, and V) NOT DETECTED NOT DETECTED Final    Comment: Performed at [facility], [address], [place], [place] [address]  Urine Culture     Status: None   Collection Time: 06/01/21 10:23 AM   Specimen: In/Out Cath Urine  Result Value Ref Range Status   Specimen Description   Final    IN/OUT CATH URINE Performed at [facility], [address] [place], [place] [address]    Special Requests   Final    NONE Performed at [facility], [address] [place], [place] [address]    Culture   Final    NO GROWTH Performed at [facility], [place] [address] [place], [place] [address]    Report Status 06/03/2021 FINAL  Final  Culture, Respiratory w Gram Stain     Status: None   Collection Time: 06/01/21  3:59 PM   Specimen: Tracheal Aspirate; Respiratory  Result Value Ref Range Status   Specimen Description   Final    TRACHEAL ASPIRATE Performed at [facility], [address] [place], [place] [address]    Special Requests   Final    NONE Performed at [facility], [place], [place] [address]    Gram Stain   Final    RARE SQUAMOUS EPITHELIAL CELLS PRESENT MODERATE WBC PRESENT,BOTH PMN  AND MONONUCLEAR MODERATE GRAM POSITIVE COCCI FEW GRAM NEGATIVE RODS FEW GRAM POSITIVE RODS RARE YEAST    Culture   Final    FEW Consistent with normal respiratory flora. No Pseudomonas species isolated Performed at [facility] [place] [address] [place], [place] [address]    Report Status 06/03/2021 FINAL  Final  CULTURE, BLOOD (ROUTINE X 2) w Reflex to ID Panel     Status: None   Collection Time: 06/01/21  7:06 PM   Specimen: BLOOD  Result Value Ref Range Status   Specimen Description BLOOD RIGHT ANTECUBITAL  Final   Special Requests   Final    BOTTLES DRAWN AEROBIC ONLY Blood Culture results may not be optimal due to an inadequate volume of blood received in culture bottles   Culture   Final    NO GROWTH 5 DAYS Performed at [facility], [address] [place], [place] [address]    Report Status 06/06/2021 FINAL  Final  CULTURE, BLOOD (ROUTINE X 2) w Reflex to ID Panel     Status: None   Collection Time: 06/01/21  7:37 PM   Specimen: BLOOD  Result Value Ref Range Status   Specimen Description BLOOD [facility]  Final   Special Requests BOTTLES DRAWN AEROBIC AND ANAEROBIC BCLV  Final   Culture   Final    NO GROWTH 5 DAYS Performed at [facility], [address], [place], [place] [address]    Report Status 06/06/2021 FINAL  Final  MRSA Next Gen by PCR, Nasal     Status: None   Collection Time: 06/02/21 10:46 AM   Specimen: Nasal Mucosa; Nasal Swab  Result Value Ref Range Status   MRSA by PCR Next Gen NOT DETECTED NOT DETECTED Final    Comment: (NOTE) The GeneXpert MRSA Assay (FDA approved for NASAL specimens only), is one component of a comprehensive MRSA colonization surveillance program. It is not intended to diagnose MRSA infection nor to guide or monitor treatment for MRSA infections. Test performance is not FDA approved in patients less than 75 years old. Performed at [facility], [address] [place], [place] [address]   Culture,  Respiratory w Gram Stain     Status: None   Collection Time: 06/04/21  7:38 AM   Specimen: Tracheal Aspirate; Respiratory  Result Value Ref Range Status   Specimen Description   Final    TRACHEAL ASPIRATE Performed at [facility], [address] [place], [place] [address]    Special Requests   Final    NONE Performed at [facility], [address] [place], [place] [address]    Gram Stain   Final    MODERATE WBC PRESENT,BOTH PMN AND MONONUCLEAR NO ORGANISMS SEEN Performed at [facility], [place] [address] [place], [place] [address]    Culture FEW CANDIDA ALBICANS RARE ESCHERICHIA COLI   Final   Report Status 06/07/2021 FINAL  Final   Organism ID, Bacteria ESCHERICHIA COLI  Final      Susceptibility   Escherichia coli - MIC*    AMPICILLIN >=32 RESISTANT Resistant     CEFAZOLIN <=4 SENSITIVE Sensitive     CEFEPIME <=0.12 SENSITIVE Sensitive     CEFTAZIDIME <=1 SENSITIVE Sensitive     CEFTRIAXONE <=0.25 SENSITIVE Sensitive     CIPROFLOXACIN <=0.25 SENSITIVE Sensitive     GENTAMICIN <=1 SENSITIVE Sensitive     IMIPENEM <=0.25 SENSITIVE Sensitive     TRIMETH/SULFA >=320 RESISTANT Resistant     AMPICILLIN/SULBACTAM >=32 RESISTANT Resistant     PIP/TAZO <=4 SENSITIVE Sensitive     * RARE ESCHERICHIA COLI  CULTURE, BLOOD (ROUTINE X 2) w Reflex to ID Panel     Status: None   Collection Time: 06/04/21  6:35 PM   Specimen: BLOOD  Result Value Ref Range Status   Specimen Description BLOOD BLOOD LEFT HAND  Final   Special Requests   Final    BOTTLES DRAWN AEROBIC AND ANAEROBIC Blood Culture adequate volume   Culture   Final    NO GROWTH 5 DAYS Performed at [facility], [address], [place], [place] [address]    Report Status 06/09/2021 FINAL  Final    Coagulation Studies: No results for input(s): LABPROT, INR in the last 72 hours.   Urinalysis: No results for input(s): COLORURINE, LABSPEC, PHURINE, GLUCOSEU, HGBUR, BILIRUBINUR,  KETONESUR, PROTEINUR, UROBILINOGEN, NITRITE, LEUKOCYTESUR in the last 72 hours.  Invalid input(s): APPERANCEUR     Imaging: No results found.   Medications:    sodium chloride     sodium chloride     sodium chloride Stopped (06/12/21 2313)    ceFAZolin (ANCEF) IV 1 g (06/14/21 0220)    heparin sodium (porcine)       amiodarone  200 mg Oral BID   vitamin C  500 mg Oral BID   chlorhexidine gluconate (MEDLINE KIT)  15  mL Mouth Rinse BID   Chlorhexidine Gluconate Cloth  6 each Topical Daily   Chlorhexidine Gluconate Cloth  6 each Topical Q0600   collagenase   Topical Daily   feeding supplement (NEPRO CARB STEADY)  237 mL Oral BID BM   hydrocerin   Topical BID   insulin aspart  0-15 Units Subcutaneous TID WC   insulin glargine-yfgn  5 Units Subcutaneous QHS   mouth rinse  15 mL Mouth Rinse BID   multivitamin  1 tablet Oral QHS   pantoprazole  40 mg Oral Daily   sodium chloride flush  10-40 mL Intracatheter Q12H   thiamine injection  100 mg Intravenous Q24H   zinc sulfate  220 mg Oral Daily   sodium chloride, sodium chloride, sodium chloride, alteplase, docusate, fentaNYL (SUBLIMAZE) injection, guaiFENesin-dextromethorphan, heparin, ipratropium-albuterol, lidocaine (PF), lidocaine-prilocaine, lip balm, ondansetron **OR** ondansetron (ZOFRAN) IV, oxyCODONE, pentafluoroprop-tetrafluoroeth, polyvinyl alcohol, sodium chloride flush  Assessment/ Plan:  [name] is a 26 y.o. black male with hypertension, insulin dependent diabetes mellitus type I, diabetic gastroparesis, diabetic neuropathy, tobacco use, THC use, right toe amputation, perirectal abscess, who is admitted to [facility] on 05/31/2021 for SIRS (systemic inflammatory response syndrome) ([place]) [R65.10] AKI (acute kidney injury) ([place]) [N17.9] Symptomatic anemia [D64.9] Acute renal failure superimposed on stage 5 chronic kidney disease, not on chronic dialysis, unspecified acute renal failure type ([place]) [N17.9,  N18.5] Hypertension, unspecified type [I10] Acute cough [R05.1]  Cardiac arrest with code blue 06/01/2021 at 10 am. Transferred to ICU. Intubated  Acute kidney injury on chronic kidney disease stage IV versus progression of chronic kidney disease to end stage renal disease. Baseline creatinine of 4.13, GFR of 19 on 08/26/20. History of nephrotic range proteinuria and hematuria. No history of renal biopsy. Strong family history of dialysis with mother with ESRD before passing. Was on CRRT.  -Patient currently receiving hemodialysis, tolerating well.  UF goal 1 L as tolerated. -We will consult vascular for PermCath placement tomorrow as we feel that dialysis will be required after discharge. -Dialysis coordinator aware of patient and outpatient placement in progress -Next treatment scheduled for Friday    Acute respiratory failure Extubated then re-intubated 06/03/2021; extubated 06/08/2021 -Patient remains on room air  Hypotension with cardiogenic shock -Blood pressure 137/81 during dialysis. maintain the patient on hydrocortisone.  4. Insulin dependent Diabetes type 1 with CKD and proteinuria   Lab Results  Component Value Date   HGBA1C 5.7 (H) 05/31/2021    5. Thrombocytopenia Hematology evaluation on 1/22 Suspected peripheral destruction Platelets 50,000.  We will continue to monitor.    LOS: [place] 2/1/202312:44 PM

## 2021-06-14 NOTE — Progress Notes (Signed)
OT Cancellation Note  Patient Details Name: Jonathan Mcmillan MRN: [id number] DOB: [birth year]   Cancelled Treatment:    Reason Eval/Treat Not Completed: Patient at procedure or test/ unavailable. Pt noted to be off the floor for HD, unavailable at this time. Will continue to follow POC at later date/time as pt available. Of note, pt continues to be limited with therapy by temporary femoral HD catheter.   Dessie Coma, M.S. OTR/L  06/14/21, 10:37 AM  ascom [phone]

## 2021-06-14 NOTE — Hospital Course (Addendum)
26 year old black male diagnosed with diabetes mellitus type 1 in 2012 Several prior episodes and admissions for DKA--significant history of noncompliance noted in the past (not taking insulin for years at a time) Several admissions for abscesses in 2018 2019 Tobacco, cannabinoid hyperemesis syndrome additionally Accelerated hypertension supposed to be on meds Last hospitalization 06/10/2020 through 06/13/2020-at that admission was recovering from [place] (05/22/2020)-initial creatinine was 5.5 and he recovered  Readmit 05/31/2021 3/7 H/0 fever chills nonproductive cough myalgias-Tmax 102.6 and met sepsis criteria and lactic acid 2.1 BUN/creatinine up to 13.8 from baseline of 3.9 Platelet count 172 Urine analysis gross proteinuria CXR no acute cardiopulmonary disease   Procedures and Significant Results:  ACLS 2/2 asystole/PEA arrest 2/2 metabolic derangements 6/89/3406 06/01/2021 intubation, placement central line, A-line, trialysis catheter, CRRT initiated 06/03/2021 extubated, reintubated 06/04/2021 due to large volume aspiration, worsening shock. 2/2 Placement of a 19 cm tip to cuff tunneled hemodialysis catheter via the right internal jugular vein Received several platelet transfusions for severe thrombocytopenia felt secondary to severe sepsis Transferred to hospitalist service 06/13/2021-placed on dysphagia diet postextubation    Consultants:  PCCM Nephrology - AKI--declared ESRD 06/14/2021 Cardiology  -cardiac arrest, HFrEF-35-40% global hypokinesis hematology -thrombocytopenia Neurology -anoxic brain injury status postcardiac arrest Vascular surgeon -hemodialysis catheter  Gastroenterology consulted because of positive lactoferrin-plans for outpatient colonoscopy rule out IBD--- need to follow C. difficile quick test as well as fecal calprotectin in a.m. 06/21/2021

## 2021-06-14 NOTE — Assessment & Plan Note (Addendum)
Resolved

## 2021-06-15 ENCOUNTER — Encounter
Admission: EM | Disposition: A | Discharge status: Rehabilitation Facility | Payer: Self-pay | Source: Home / Self Care | Attending: Internal Medicine

## 2021-06-15 DIAGNOSIS — N185 Chronic kidney disease, stage 5: Secondary | ICD-10-CM

## 2021-06-15 HISTORY — PX: DIALYSIS/PERMA CATHETER INSERTION: CATH118288

## 2021-06-15 LAB — BASIC METABOLIC PANEL
Anion gap: 7 (ref 5–15)
BUN: 51 mg/dL — ABNORMAL HIGH (ref 6–20)
CO2: 24 mmol/L (ref 22–32)
Calcium: 6.5 mg/dL — ABNORMAL LOW (ref 8.9–10.3)
Chloride: 105 mmol/L (ref 98–111)
Creatinine, Ser: 3.33 mg/dL — ABNORMAL HIGH (ref 0.61–1.24)
GFR, Estimated: 25 mL/min — ABNORMAL LOW (ref >60)
Glucose, Bld: 146 mg/dL — ABNORMAL HIGH (ref 70–99)
Potassium: 4.3 mmol/L (ref 3.5–5.1)
Sodium: 136 mmol/L (ref 135–145)

## 2021-06-15 LAB — CBC
HCT: 26.2 % — ABNORMAL LOW (ref 39.0–52.0)
Hemoglobin: 8.8 g/dL — ABNORMAL LOW (ref 13.0–17.0)
MCH: 29.3 pg (ref 26.0–34.0)
MCHC: 33.6 g/dL (ref 30.0–36.0)
MCV: 87.3 fL (ref 80.0–100.0)
Platelets: 58 10*3/uL — ABNORMAL LOW (ref 150–400)
RBC: 3 MIL/uL — ABNORMAL LOW (ref 4.22–5.81)
RDW: 15.6 % — ABNORMAL HIGH (ref 11.5–15.5)
WBC: 8.2 10*3/uL (ref 4.0–10.5)
nRBC: 0 % (ref 0.0–0.2)

## 2021-06-15 LAB — GLUCOSE, CAPILLARY
Glucose-Capillary: 108 mg/dL — ABNORMAL HIGH (ref 70–99)
Glucose-Capillary: 125 mg/dL — ABNORMAL HIGH (ref 70–99)
Glucose-Capillary: 126 mg/dL — ABNORMAL HIGH (ref 70–99)
Glucose-Capillary: 143 mg/dL — ABNORMAL HIGH (ref 70–99)
Glucose-Capillary: 174 mg/dL — ABNORMAL HIGH (ref 70–99)
Glucose-Capillary: 58 mg/dL — ABNORMAL LOW (ref 70–99)
Glucose-Capillary: 60 mg/dL — ABNORMAL LOW (ref 70–99)
Glucose-Capillary: 84 mg/dL (ref 70–99)
Glucose-Capillary: 91 mg/dL (ref 70–99)

## 2021-06-15 SURGERY — DIALYSIS/PERMA CATHETER INSERTION
Anesthesia: Moderate Sedation

## 2021-06-15 MED ORDER — DEXTROSE 50 % IV SOLN
INTRAVENOUS | Status: AC
  Administered 2021-06-15: 1 via INTRAVENOUS
  Filled 2021-06-15: qty 50

## 2021-06-15 MED ORDER — MIDAZOLAM HCL 2 MG/2ML IJ SOLN
INTRAMUSCULAR | Status: AC
  Filled 2021-06-15: qty 2

## 2021-06-15 MED ORDER — CEFAZOLIN SODIUM-DEXTROSE 2-4 GM/100ML-% IV SOLN
2.0000 g | Freq: Three times a day (TID) | INTRAVENOUS | Status: DC
Start: 2021-06-15 — End: 2021-06-15

## 2021-06-15 MED ORDER — DEXTROSE 50 % IV SOLN
12.5000 g | INTRAVENOUS | Status: AC
  Administered 2021-06-15: 12.5 g via INTRAVENOUS
  Filled 2021-06-15: qty 50

## 2021-06-15 MED ORDER — FENTANYL CITRATE (PF) 100 MCG/2ML IJ SOLN
INTRAMUSCULAR | Status: DC | PRN
  Administered 2021-06-15: 50 ug via INTRAVENOUS

## 2021-06-15 MED ORDER — AMIODARONE HCL 200 MG PO TABS
200.0000 mg | ORAL_TABLET | Freq: Every day | ORAL | Status: DC
  Administered 2021-06-16 – 2021-06-22 (×5): 200 mg via ORAL
  Filled 2021-06-15 (×6): qty 1

## 2021-06-15 MED ORDER — MIDAZOLAM HCL 2 MG/2ML IJ SOLN
INTRAMUSCULAR | Status: DC | PRN
  Administered 2021-06-15: 1 mg via INTRAVENOUS

## 2021-06-15 MED ORDER — CEFAZOLIN SODIUM-DEXTROSE 1-4 GM/50ML-% IV SOLN: 1.0000 g | INTRAVENOUS | Status: DC

## 2021-06-15 MED ORDER — DEXTROSE 50 % IV SOLN: 1.0000 | Freq: Once | INTRAVENOUS | Status: AC

## 2021-06-15 MED ORDER — FENTANYL CITRATE PF 50 MCG/ML IJ SOSY
PREFILLED_SYRINGE | INTRAMUSCULAR | Status: AC
  Filled 2021-06-15: qty 1

## 2021-06-15 MED ORDER — CEFAZOLIN SODIUM-DEXTROSE 1-4 GM/50ML-% IV SOLN
INTRAVENOUS | Status: AC | PRN
  Administered 2021-06-15: 1 g via INTRAVENOUS

## 2021-06-15 SURGICAL SUPPLY — 8 items
ADH SKN CLS APL DERMABOND .7 (GAUZE/BANDAGES/DRESSINGS) ×1
CATH CANNON HEMO 15FR 19 (HEMODIALYSIS SUPPLIES) ×1 IMPLANT
COVER PROBE U/S 5X48 (MISCELLANEOUS) ×1 IMPLANT
DERMABOND ADVANCED (GAUZE/BANDAGES/DRESSINGS) ×1
DERMABOND ADVANCED .7 DNX12 (GAUZE/BANDAGES/DRESSINGS) IMPLANT
PACK ANGIOGRAPHY (CUSTOM PROCEDURE TRAY) ×1 IMPLANT
SUT MNCRL AB 4-0 PS2 18 (SUTURE) ×1 IMPLANT
SUT PROLENE 0 CT 1 30 (SUTURE) ×1 IMPLANT

## 2021-06-15 NOTE — H&P (View-Only) (Signed)
[facility]  ROUNDING NOTE   Subjective:   Patient seen laying in bed, alert and oriented Currently n.p.o. for PermCath placement Denies shortness of breath  Dialysis received yesterday, tolerated treatment well  Objective:  Vital signs in last 24 hours:  Temp:  [97.4 F (36.3 C)-97.9 F (36.6 C)] 97.4 F (36.3 C) (02/02 0806) Pulse Rate:  [73-246] 73 (02/02 0806) Resp:  [14-18] 18 (02/01 2131) BP: (95-152)/(58-99) 143/99 (02/02 0806) SpO2:  [100 %] 100 % (02/02 0806) Weight:  [64.7 kg] 64.7 kg (02/01 1218)  Weight change: 0 kg Filed Weights   06/14/21 0500 06/14/21 0941 06/14/21 1218  Weight: 63.7 kg 63.7 kg 64.7 kg    Intake/Output: I/O last 3 completed shifts: In: 0  Out: 500 [Other:500]   Intake/Output this shift:  No intake/output data recorded.  Physical Exam: General: No acute distress  Eyes: Anicteric  Lungs:  Diminished in bases normal effort  Heart: S1S2, no rubs  Abdomen:  Soft, nontender, BS present  Extremities: Trace peripheral edema.  Neurologic: Awake, alert, no rbus  Skin: Peeling skin noted  Access:  Right femoral temp cath  Rectal tube, external catheter   Basic Metabolic Panel: Recent Labs  Lab 06/10/21 0500 06/11/21 0334 06/12/21 0339 06/13/21 0404 06/14/21 0346 06/15/21 0402  NA 134* 135 134* 133* 130* 136  K 3.8 3.3* 3.8 3.6 4.3 4.3  CL 103 104 104 100 98 105  CO2 _0 GLUCOSE 161* 164* 151* 50* 267* 146*  BUN 36* 54* 69* 56* 68* 51*  CREATININE 1.75* 2.30* 2.71* 2.81* 3.54* 3.33*  CALCIUM 6.7* 6.8* 6.5* 6.6* 6.3* 6.5*  MG 2.5* 2.7* 2.8* 2.4 2.6*  --   PHOS 3.0 3.2 4.4 3.7 4.3  --      Liver Function Tests: Recent Labs  Lab 06/09/21 0411 06/09/21 1600 06/10/21 0500 06/11/21 0334 06/12/21 0339 06/13/21 0404 06/14/21 0346  AST 25  --   --   --  16  --   --   ALT 29  --   --   --  6  --   --   ALKPHOS 194*  --   --   --  158*  --   --   BILITOT 2.7*  --   --   --  1.3*  --   --   PROT  4.8*  --   --   --  5.0*  --   --   ALBUMIN 1.6*   1.7*   < > 1.7* 1.8* 1.7* 1.8* 1.7*   < > = values in this interval not displayed.    No results for input(s): LIPASE, AMYLASE in the last 168 hours.  No results for input(s): AMMONIA in the last 168 hours.   CBC: Recent Labs  Lab 06/11/21 0334 06/12/21 0339 06/13/21 0404 06/14/21 0346 06/15/21 0402  WBC 18.7* 13.5* 9.9 9.3 8.2  HGB 8.1* 8.6* 9.5* 9.2* 8.8*  HCT 23.9* 25.1* 28.3* 26.9* 26.2*  MCV 87.2 85.7 87.6 87.3 87.3  PLT 35* 38* 46* 50* 58*     Cardiac Enzymes: No results for input(s): CKTOTAL, CKMB, CKMBINDEX, TROPONINI in the last 168 hours.   BNP: Invalid input(s): POCBNP  CBG: Recent Labs  Lab 06/14/21 1655 06/14/21 2120 06/14/21 2357 06/15/21 0529 06/15/21 0805  GLUCAP 135* 122* 174* 108* 91     Microbiology: Results for orders placed or performed during the hospital encounter of 05/31/21  Resp Panel by RT-PCR (Flu  A&B, Covid) Nasopharyngeal Swab     Status: None   Collection Time: 05/31/21  6:58 AM   Specimen: Nasopharyngeal Swab; Nasopharyngeal(NP) swabs in vial transport medium  Result Value Ref Range Status   SARS Coronavirus 2 by RT PCR NEGATIVE NEGATIVE Final    Comment: (NOTE) SARS-CoV-2 target nucleic acids are NOT DETECTED.  The SARS-CoV-2 RNA is generally detectable in upper respiratory specimens during the acute phase of infection. The lowest concentration of SARS-CoV-2 viral copies this assay can detect is 138 copies/mL. A negative result does not preclude SARS-Cov-2 infection and should not be used as the sole basis for treatment or other patient management decisions. A negative result may occur with  improper specimen collection/handling, submission of specimen other than nasopharyngeal swab, presence of viral mutation(s) within the areas targeted by this assay, and inadequate number of viral copies(<138 copies/mL). A negative result must be combined with clinical observations,  patient history, and epidemiological information. The expected result is Negative.  Fact Sheet for Patients:  EntrepreneurPulse.com.au  Fact Sheet for Healthcare Providers:  IncredibleEmployment.be  This test is no t yet approved or cleared by the [place] FDA and  has been authorized for detection and/or diagnosis of SARS-CoV-2 by FDA under an Emergency Use Authorization (EUA). This EUA will remain  in effect (meaning this test can be used) for the duration of the COVID-19 declaration under Section 564(b)(1) of the Act, 21 U.S.C.section 360bbb-3(b)(1), unless the authorization is terminated  or revoked sooner.       Influenza A by PCR NEGATIVE NEGATIVE Final   Influenza B by PCR NEGATIVE NEGATIVE Final    Comment: (NOTE) The Xpert Xpress SARS-CoV-2/FLU/RSV plus assay is intended as an aid in the diagnosis of influenza from Nasopharyngeal swab specimens and should not be used as a sole basis for treatment. Nasal washings and aspirates are unacceptable for Xpert Xpress SARS-CoV-2/FLU/RSV testing.  Fact Sheet for Patients: EntrepreneurPulse.com.au  Fact Sheet for Healthcare Providers: IncredibleEmployment.be  This test is not yet approved or cleared by the [place] FDA and has been authorized for detection and/or diagnosis of SARS-CoV-2 by FDA under an Emergency Use Authorization (EUA). This EUA will remain in effect (meaning this test can be used) for the duration of the COVID-19 declaration under Section 564(b)(1) of the Act, 21 U.S.C. section 360bbb-3(b)(1), unless the authorization is terminated or revoked.  Performed at [facility], [place], [place] [address]   Respiratory (~20 pathogens) panel by PCR     Status: None   Collection Time: 05/31/21  6:58 AM   Specimen: Nasopharyngeal Swab; Respiratory  Result Value Ref Range Status   Adenovirus NOT DETECTED NOT  DETECTED Final   Coronavirus 229E NOT DETECTED NOT DETECTED Final    Comment: (NOTE) The Coronavirus on the Respiratory Panel, DOES NOT test for the novel  Coronavirus (2019 nCoV)    Coronavirus HKU1 NOT DETECTED NOT DETECTED Final   Coronavirus NL63 NOT DETECTED NOT DETECTED Final   Coronavirus OC43 NOT DETECTED NOT DETECTED Final   Metapneumovirus NOT DETECTED NOT DETECTED Final   Rhinovirus / Enterovirus NOT DETECTED NOT DETECTED Final   Influenza A NOT DETECTED NOT DETECTED Final   Influenza B NOT DETECTED NOT DETECTED Final   Parainfluenza Virus 1 NOT DETECTED NOT DETECTED Final   Parainfluenza Virus 2 NOT DETECTED NOT DETECTED Final   Parainfluenza Virus 3 NOT DETECTED NOT DETECTED Final   Parainfluenza Virus 4 NOT DETECTED NOT DETECTED Final   Respiratory Syncytial Virus  NOT DETECTED NOT DETECTED Final   Bordetella pertussis NOT DETECTED NOT DETECTED Final   Bordetella Parapertussis NOT DETECTED NOT DETECTED Final   Chlamydophila pneumoniae NOT DETECTED NOT DETECTED Final   Mycoplasma pneumoniae NOT DETECTED NOT DETECTED Final    Comment: Performed at [facility], [place] [address], [place], [place] [address]  Group A Strep by PCR [facility] Only)     Status: None   Collection Time: 05/31/21  8:35 AM   Specimen: Throat; Sterile Swab  Result Value Ref Range Status   Group A Strep by PCR NOT DETECTED NOT DETECTED Final    Comment: Performed at [facility], [address] [place], [place] [address]  Blood culture (routine x 2)     Status: None   Collection Time: 05/31/21  8:39 AM   Specimen: BLOOD  Result Value Ref Range Status   Specimen Description BLOOD BLOOD RIGHT FOREARM  Final   Special Requests   Final    BOTTLES DRAWN AEROBIC AND ANAEROBIC Blood Culture results may not be optimal due to an excessive volume of blood received in culture bottles   Culture   Final    NO GROWTH 5 DAYS Performed at [facility], [address] [place], [place]  [address]    Report Status 06/05/2021 FINAL  Final  Blood culture (routine x 2)     Status: None   Collection Time: 05/31/21  8:39 AM   Specimen: BLOOD  Result Value Ref Range Status   Specimen Description BLOOD RIGHT ANTECUBITAL  Final   Special Requests   Final    BOTTLES DRAWN AEROBIC AND ANAEROBIC Blood Culture results may not be optimal due to an excessive volume of blood received in culture bottles   Culture   Final    NO GROWTH 5 DAYS Performed at [facility], [address] [place], [place] [address]    Report Status 06/05/2021 FINAL  Final  C Difficile Quick Screen w PCR reflex     Status: None   Collection Time: 05/31/21  1:20 PM   Specimen: Stool  Result Value Ref Range Status   C Diff antigen NEGATIVE NEGATIVE Final   C Diff toxin NEGATIVE NEGATIVE Final   C Diff interpretation No C. difficile detected.  Final    Comment: Performed at [facility], [address] [place],  [address]  Gastrointestinal Panel by PCR , Stool     Status: None   Collection Time: 05/31/21  1:30 PM   Specimen: Stool  Result Value Ref Range Status   Campylobacter species NOT DETECTED NOT DETECTED Final   Plesimonas shigelloides NOT DETECTED NOT DETECTED Final   Salmonella species NOT DETECTED NOT DETECTED Final   Yersinia enterocolitica NOT DETECTED NOT DETECTED Final   Vibrio species NOT DETECTED NOT DETECTED Final   Vibrio cholerae NOT DETECTED NOT DETECTED Final   Enteroaggregative E coli (EAEC) NOT DETECTED NOT DETECTED Final   Enteropathogenic E coli (EPEC) NOT DETECTED NOT DETECTED Final   Enterotoxigenic E coli (ETEC) NOT DETECTED NOT DETECTED Final   Shiga like toxin producing E coli (STEC) NOT DETECTED NOT DETECTED Final   Shigella/Enteroinvasive E coli (EIEC) NOT DETECTED NOT DETECTED Final   Cryptosporidium NOT DETECTED NOT DETECTED Final   Cyclospora cayetanensis NOT DETECTED NOT DETECTED Final   Entamoeba histolytica NOT DETECTED NOT DETECTED Final    Giardia lamblia NOT DETECTED NOT DETECTED Final   Adenovirus F40/41 NOT DETECTED NOT DETECTED Final   Astrovirus  NOT DETECTED NOT DETECTED Final   Norovirus GI/GII NOT DETECTED NOT DETECTED Final   Rotavirus A NOT DETECTED NOT DETECTED Final   Sapovirus (I, II, IV, and V) NOT DETECTED NOT DETECTED Final    Comment: Performed at [facility], [address], [place], [place] [address]  Urine Culture     Status: None   Collection Time: 06/01/21 10:23 AM   Specimen: In/Out Cath Urine  Result Value Ref Range Status   Specimen Description   Final    IN/OUT CATH URINE Performed at [facility], [address] [place], [place] [address]    Special Requests   Final    NONE Performed at [facility], [address], [place], [place] [address]    Culture   Final    NO GROWTH Performed at [facility], [place] [address], [place], [place] [address]    Report Status 06/03/2021 FINAL  Final  Culture, Respiratory w Gram Stain     Status: None   Collection Time: 06/01/21  3:59 PM   Specimen: Tracheal Aspirate; Respiratory  Result Value Ref Range Status   Specimen Description   Final    TRACHEAL ASPIRATE Performed at [facility], [address] [place], [place] [address]    Special Requests   Final    NONE Performed at [facility], [place], [place] [address]    Gram Stain   Final    RARE SQUAMOUS EPITHELIAL CELLS PRESENT MODERATE WBC PRESENT,BOTH PMN AND MONONUCLEAR MODERATE GRAM POSITIVE COCCI FEW GRAM NEGATIVE RODS FEW GRAM POSITIVE RODS RARE YEAST    Culture   Final    FEW Consistent with normal respiratory flora. No Pseudomonas species isolated Performed at [facility] [address], [place], [place] [address]    Report Status 06/03/2021 FINAL  Final  CULTURE, BLOOD (ROUTINE X 2) w Reflex to ID Panel     Status: None   Collection Time: 06/01/21  7:06 PM   Specimen: BLOOD  Result Value Ref Range  Status   Specimen Description BLOOD RIGHT ANTECUBITAL  Final   Special Requests   Final    BOTTLES DRAWN AEROBIC ONLY Blood Culture results may not be optimal due to an inadequate volume of blood received in culture bottles   Culture   Final    NO GROWTH 5 DAYS Performed at [facility], [address] [place], [place] [address]    Report Status 06/06/2021 FINAL  Final  CULTURE, BLOOD (ROUTINE X 2) w Reflex to ID Panel     Status: None   Collection Time: 06/01/21  7:37 PM   Specimen: BLOOD  Result Value Ref Range Status   Specimen Description BLOOD [facility]  Final   Special Requests BOTTLES DRAWN AEROBIC AND ANAEROBIC BCLV  Final   Culture   Final    NO GROWTH 5 DAYS Performed at [facility], [address] [place], [place] [address]    Report Status 06/06/2021 FINAL  Final  MRSA Next Gen by PCR, Nasal     Status: None   Collection Time: 06/02/21 10:46 AM   Specimen: Nasal Mucosa; Nasal Swab  Result Value Ref Range Status   MRSA by PCR Next Gen NOT DETECTED NOT DETECTED Final    Comment: (NOTE) The GeneXpert MRSA Assay (FDA approved for NASAL specimens only), is one component of a comprehensive MRSA colonization surveillance program. It is not intended to diagnose MRSA infection nor  to guide or monitor treatment for MRSA infections. Test performance is not FDA approved in patients less than 43 years old. Performed at [facility], [address] [place], [place] [address]   Culture, Respiratory w Gram Stain     Status: None   Collection Time: 06/04/21  7:38 AM   Specimen: Tracheal Aspirate; Respiratory  Result Value Ref Range Status   Specimen Description   Final    TRACHEAL ASPIRATE Performed at [facility], [address], [place], [place] [address]    Special Requests   Final    NONE Performed at [facility], [address] [place], [place] [address]    Gram Stain   Final    MODERATE WBC PRESENT,BOTH PMN AND  MONONUCLEAR NO ORGANISMS SEEN Performed at [facility], [place] [address], [place], [place] [address]    Culture FEW CANDIDA ALBICANS RARE ESCHERICHIA COLI   Final   Report Status 06/07/2021 FINAL  Final   Organism ID, Bacteria ESCHERICHIA COLI  Final      Susceptibility   Escherichia coli - MIC*    AMPICILLIN >=32 RESISTANT Resistant     CEFAZOLIN <=4 SENSITIVE Sensitive     CEFEPIME <=0.12 SENSITIVE Sensitive     CEFTAZIDIME <=1 SENSITIVE Sensitive     CEFTRIAXONE <=0.25 SENSITIVE Sensitive     CIPROFLOXACIN <=0.25 SENSITIVE Sensitive     GENTAMICIN <=1 SENSITIVE Sensitive     IMIPENEM <=0.25 SENSITIVE Sensitive     TRIMETH/SULFA >=320 RESISTANT Resistant     AMPICILLIN/SULBACTAM >=32 RESISTANT Resistant     PIP/TAZO <=4 SENSITIVE Sensitive     * RARE ESCHERICHIA COLI  CULTURE, BLOOD (ROUTINE X 2) w Reflex to ID Panel     Status: None   Collection Time: 06/04/21  6:35 PM   Specimen: BLOOD  Result Value Ref Range Status   Specimen Description BLOOD BLOOD LEFT HAND  Final   Special Requests   Final    BOTTLES DRAWN AEROBIC AND ANAEROBIC Blood Culture adequate volume   Culture   Final    NO GROWTH 5 DAYS Performed at [facility], [address], [place],  [address]    Report Status 06/09/2021 FINAL  Final    Coagulation Studies: No results for input(s): LABPROT, INR in the last 72 hours.   Urinalysis: No results for input(s): COLORURINE, LABSPEC, PHURINE, GLUCOSEU, HGBUR, BILIRUBINUR, KETONESUR, PROTEINUR, UROBILINOGEN, NITRITE, LEUKOCYTESUR in the last 72 hours.  Invalid input(s): APPERANCEUR     Imaging: No results found.   Medications:    sodium chloride Stopped (06/12/21 2313)   [START ON 06/16/2021]  ceFAZolin (ANCEF) IV      ceFAZolin (ANCEF) IV      amiodarone  200 mg Oral BID   vitamin C  500 mg Oral BID   chlorhexidine gluconate (MEDLINE KIT)  15 mL Mouth Rinse BID   Chlorhexidine Gluconate Cloth  6 each Topical Daily    Chlorhexidine Gluconate Cloth  6 each Topical Q0600   collagenase   Topical Daily   feeding supplement (NEPRO CARB STEADY)  237 mL Oral BID BM   hydrocerin   Topical BID   insulin aspart  0-15 Units Subcutaneous TID WC   insulin glargine-yfgn  5 Units Subcutaneous QHS   mouth rinse  15 mL Mouth Rinse BID   multivitamin  1 tablet Oral QHS   pantoprazole  40 mg Oral Daily   sodium chloride flush  10-40 mL Intracatheter Q12H   thiamine  injection  100 mg Intravenous Q24H   zinc sulfate  220 mg Oral Daily   sodium chloride, docusate, fentaNYL (SUBLIMAZE) injection, guaiFENesin-dextromethorphan, ipratropium-albuterol, lip balm, ondansetron **OR** ondansetron (ZOFRAN) IV, oxyCODONE, polyvinyl alcohol, sodium chloride flush  Assessment/ Plan:  [name] is a 26 y.o. black male with hypertension, insulin dependent diabetes mellitus type I, diabetic gastroparesis, diabetic neuropathy, tobacco use, THC use, right toe amputation, perirectal abscess, who is admitted to [facility] on 05/31/2021 for SIRS (systemic inflammatory response syndrome) ([place]) [R65.10] AKI (acute kidney injury) ([place]) [N17.9] Symptomatic anemia [D64.9] Acute renal failure superimposed on stage 5 chronic kidney disease, not on chronic dialysis, unspecified acute renal failure type ([place]) [N17.9, N18.5] Hypertension, unspecified type [I10] Acute cough [R05.1]  Cardiac arrest with code blue 06/01/2021 at 10 am. Transferred to ICU. Intubated  Acute kidney injury on chronic kidney disease stage IV versus progression of chronic kidney disease to end stage renal disease. Baseline creatinine of 4.13, GFR of 19 on 08/26/20. History of nephrotic range proteinuria and hematuria. No history of renal biopsy. Strong family history of dialysis with mother with ESRD before passing. Was on CRRT.  -Received dialysis yesterday, UF goal 500 mL achieved.  Tolerated treatment well - Vascular surgery to place PermCath today - After placement we will  place order for removal of temp cath -Next treatment scheduled for Friday -Dialysis coordinator aware of patient and outpatient placement currently in process    Acute respiratory failure Extubated then re-intubated 06/03/2021; extubated 06/08/2021 -Denies shortness of breath  Hypotension with cardiogenic shock -Blood pressure 143/99  -Remains on amiodarone  4. Insulin dependent Diabetes type 1 with CKD and proteinuria   Lab Results  Component Value Date   HGBA1C 5.7 (H) 05/31/2021  Glucose stable, primary team to manage SSI  5. Thrombocytopenia Hematology evaluation on 1/22 Suspected peripheral destruction Platelets continue to slowly increase, 58,000 today    LOS: [place] 2/2/202310:32 AM

## 2021-06-15 NOTE — Progress Notes (Signed)
PROGRESS NOTE  Jonathan Mcmillan LZJ:[id number] DOB: 11-Aug-1995 DOA: 05/31/2021 PCP: [facility] [place]  HPI/Recap of past 24 hours:  Jonathan Mcmillan is a 76 M with hx T1DM, CKD IV baseline Cr 1.8-2.2, HTN, smoking, who presented with 3d history fever, chills, nonproductive cough, myalgia, diarrhea, admitted for worsening renal function.   On hospital day 2, patient became hypotensive, hypoglycemic had CODE BLUE asystole progressed to PEA, achieved ROSC and transferred to ICU.     Started treatment for sepsis, tracheal aspirate grew E. coli, started on CRRT, extubated 06/03/2021 but needed to be reintubated 06/04/2021 apparently due to large volume aspiration, worsening shock, multiorgan failure, and severe thrombocytopenia (not DIC).    Underwent platelet transfusion and went into A. fib RVR, was able to be extubated 06/08/2021.  Underwent several transfusions PRBCs/platelets.  Off CRRT 06/10/2021.     Swallow study passed to dysphagia, on day 3 post extubation medically stable off CRRT. Transfer to hospitalist 06/13/21   06/15/2021: Patient was seen and examined at his bedside this morning.  Denies having any pain.  Notable severe skin sloughing.  Plan for discharge to inpatient rehab.  Assessment/Plan: Principal Problem:   Septic shock (HCC) Active Problems:   Hypertension   Acute kidney injury superimposed on CKD (HCC)   Type 1 diabetes mellitus with complication (HCC)   Nicotine dependence   Symptomatic anemia   Cardiopulmonary arrest ([place])   Acute respiratory failure with hypoxia (HCC)   Thrombocytopenia (HCC)   Anemia in chronic kidney disease, on chronic dialysis (HCC)   Paroxysmal atrial fibrillation (HCC)   Chronic combined systolic and diastolic CHF (congestive heart failure) (HCC)   Elevated troponin   Hypocalcemia   Pressure injury of buttock, stage 2 (HCC)  Septic shock due to Ecoli pneumonia Completed 4 days Zosyn then 3 days Unasyn then 6 days Cefazolin.   Blood cultures on 1/18, 1/19, 1/22 negative. Sputum 1/22 with Ecoli   Now off Abx.   - Continue to monitor fever curve       Acute kidney injury superimposed on CKD (HCC) Chronic kidney disease stage IV Baseline Cr 1.8-2.2 due to diabetes.  Here, Renal function worsened, required CRRT, since stopping CRRT, renal function has not recovered.     Nephrology recommend starting HD today. - Consult nephrology       Chronic combined systolic and diastolic CHF (congestive heart failure) ([place]) Echo this hospitalization showed EF 35-40%.   - Fluid management per HD       Acute respiratory failure with hypoxia (HCC) Resolved   Paroxysmal atrial fibrillation (HCC) Not on AC due to thrombocytopenia -Continue amiodarone   Anemia in chronic kidney disease, on chronic dialysis (HCC) Hgb stable, no change   Thrombocytopenia (HCC) Plts stable at 50K   Type 1 diabetes mellitus with complication (HCC) Glucose somewhat labile -Continue glargine -Continue SS corrections   Pressure injury RIGHT buttocks, stage 2, POA     Severe skin sloughing Will benefit from dermatology follow-up.  Severe iron deficiency anemia Iron studies showing severe iron deficiency Will benefit from IV iron supplement.        Code Status: Full code  Family Communication: None at bedside  Disposition Plan: Likely will discharge to inpatient rehab   Consultants: Vascular surgery Nephrology Cardiology  Procedures: CPR  Antimicrobials: None  DVT prophylaxis: None  Status is: Inpatient Patient requires at least 2 midnights for further evaluation and treatment.  Objective: Vitals:   06/14/21 2131 06/15/21 0806 06/15/21 1446 06/15/21 1547  BP: (!) 142/98 (!) 143/99  (!) 127/91  Pulse: 75 73 70 81  Resp: _0 Temp: 97.7 F (36.5 C) (!) 97.4 F (36.3 C) 97.6 F (36.4 C)   TempSrc: Oral Oral Oral   SpO2: 100% 100% 99%   Weight:      Height:         Intake/Output Summary (Last 24 hours) at 06/15/2021 1600 Last data filed at 06/15/2021 0100 Gross per 24 hour  Intake 0 ml  Output --  Net 0 ml   Filed Weights   06/14/21 0500 06/14/21 0941 06/14/21 1218  Weight: 63.7 kg 63.7 kg 64.7 kg    Exam:  General: 26 y.o. year-old male well developed well nourished in no acute distress.  Alert and oriented x3. Cardiovascular: Regular rate and rhythm with no rubs or gallops.  No thyromegaly or JVD noted.   Respiratory: Clear to auscultation with no wheezes or rales. Good inspiratory effort. Abdomen: Soft nontender nondistended with normal bowel sounds x4 quadrants. Musculoskeletal: No lower extremity edema. 2/4 pulses in all 4 extremities. Skin: No ulcerative lesions noted or rashes, Psychiatry: Mood is appropriate for condition and setting   Data Reviewed: CBC: Recent Labs  Lab 06/11/21 0334 06/12/21 0339 06/13/21 0404 06/14/21 0346 06/15/21 0402  WBC 18.7* 13.5* 9.9 9.3 8.2  HGB 8.1* 8.6* 9.5* 9.2* 8.8*  HCT 23.9* 25.1* 28.3* 26.9* 26.2*  MCV 87.2 85.7 87.6 87.3 87.3  PLT 35* 38* 46* 50* 58*   Basic Metabolic Panel: Recent Labs  Lab 06/10/21 0500 06/11/21 0334 06/12/21 0339 06/13/21 0404 06/14/21 0346 06/15/21 0402  NA 134* 135 134* 133* 130* 136  K 3.8 3.3* 3.8 3.6 4.3 4.3  CL 103 104 104 100 98 105  CO2 _1 GLUCOSE 161* 164* 151* 50* 267* 146*  BUN 36* 54* 69* 56* 68* 51*  CREATININE 1.75* 2.30* 2.71* 2.81* 3.54* 3.33*  CALCIUM 6.7* 6.8* 6.5* 6.6* 6.3* 6.5*  MG 2.5* 2.7* 2.8* 2.4 2.6*  --   PHOS 3.0 3.2 4.4 3.7 4.3  --    GFR: Estimated Creatinine Clearance: 30.5 mL/min (A) (by C-G formula based on SCr of 3.33 mg/dL (H)). Liver Function Tests: Recent Labs  Lab 06/09/21 0411 06/09/21 1600 06/10/21 0500 06/11/21 0334 06/12/21 0339 06/13/21 0404 06/14/21 0346  AST 25  --   --   --  16  --   --   ALT 29  --   --   --  6  --   --   ALKPHOS 194*  --   --   --  158*  --   --   BILITOT 2.7*   --   --   --  1.3*  --   --   PROT 4.8*  --   --   --  5.0*  --   --   ALBUMIN 1.6*   1.7*   < > 1.7* 1.8* 1.7* 1.8* 1.7*   < > = values in this interval not displayed.   No results for input(s): LIPASE, AMYLASE in the last 168 hours. No results for input(s): AMMONIA in the last 168 hours. Coagulation Profile: No results for input(s): INR, PROTIME in the last 168 hours. Cardiac Enzymes: No results for input(s): CKTOTAL, CKMB, CKMBINDEX, TROPONINI in the last 168 hours. BNP (last 3 results) No results for input(s): PROBNP in the last 8760  hours. HbA1C: No results for input(s): HGBA1C in the last 72 hours. CBG: Recent Labs  Lab 06/15/21 0529 06/15/21 0805 06/15/21 1125 06/15/21 1221 06/15/21 1450  GLUCAP 108* 91 58* 84 60*   Lipid Profile: No results for input(s): CHOL, HDL, LDLCALC, TRIG, CHOLHDL, LDLDIRECT in the last 72 hours. Thyroid Function Tests: No results for input(s): TSH, T4TOTAL, FREET4, T3FREE, THYROIDAB in the last 72 hours. Anemia Panel: No results for input(s): VITAMINB12, FOLATE, FERRITIN, TIBC, IRON, RETICCTPCT in the last 72 hours. Urine analysis:    Component Value Date/Time   COLORURINE AMBER (A) 06/01/2021 1023   APPEARANCEUR TURBID (A) 06/01/2021 1023   LABSPEC 1.017 06/01/2021 1023   PHURINE 6.0 06/01/2021 1023   GLUCOSEU 50 (A) 06/01/2021 1023   HGBUR MODERATE (A) 06/01/2021 1023   BILIRUBINUR NEGATIVE 06/01/2021 1023   KETONESUR NEGATIVE 06/01/2021 1023   PROTEINUR >=300 (A) 06/01/2021 1023   UROBILINOGEN 0.2 05/10/2007 2155   NITRITE NEGATIVE 06/01/2021 1023   LEUKOCYTESUR LARGE (A) 06/01/2021 1023   Sepsis Labs: _0 (procalcitonin:4,lacticidven:4)  )No results found for this or any previous visit (from the past 240 hour(s)).    Studies: PERIPHERAL VASCULAR CATHETERIZATION  Result Date: 06/15/2021 See surgical note for result.   Scheduled Meds:  [MAR Hold] amiodarone  200 mg Oral Daily   [MAR Hold] vitamin C  500 mg Oral BID    [MAR Hold] chlorhexidine gluconate (MEDLINE KIT)  15 mL Mouth Rinse BID   [MAR Hold] Chlorhexidine Gluconate Cloth  6 each Topical Daily   [MAR Hold] Chlorhexidine Gluconate Cloth  6 each Topical Q0600   [MAR Hold] collagenase   Topical Daily   [MAR Hold] feeding supplement (NEPRO CARB STEADY)  237 mL Oral BID BM   fentaNYL       [MAR Hold] hydrocerin   Topical BID   [MAR Hold] insulin aspart  0-15 Units Subcutaneous TID WC   [MAR Hold] insulin glargine-yfgn  5 Units Subcutaneous QHS   [MAR Hold] mouth rinse  15 mL Mouth Rinse BID   midazolam       [MAR Hold] multivitamin  1 tablet Oral QHS   [MAR Hold] pantoprazole  40 mg Oral Daily   [MAR Hold] sodium chloride flush  10-40 mL Intracatheter Q12H   [MAR Hold] thiamine injection  100 mg Intravenous Q24H   [MAR Hold] zinc sulfate  220 mg Oral Daily    Continuous Infusions:  [MAR Hold] sodium chloride Stopped (06/12/21 2313)    ceFAZolin (ANCEF) IV 1 g (06/15/21 1516)   [MAR Hold]  ceFAZolin (ANCEF) IV       LOS: 15 days     Kayleen Memos, MD [facility] Pager [phone]  If 7PM-7AM, please contact night-coverage [email or web address] Password [email or web address] 06/15/2021, 4:00 PM

## 2021-06-15 NOTE — Progress Notes (Signed)
°   06/12/21 1945 06/12/21 2000 06/12/21 2015  Vitals  BP 108/69 103/70 96/64  MAP (mmHg) 78 81 74  Pulse Rate 68 71 71  ECG Heart Rate 69 77 71  Resp 15 15 18   During Hemodialysis Assessment  Blood Flow Rate (mL/min) 200 mL/min 200 mL/min 200 mL/min  Arterial Pressure (mmHg) -60 mmHg -70 mmHg -70 mmHg  Venous Pressure (mmHg) 40 mmHg 40 mmHg 40 mmHg  Transmembrane Pressure (mmHg) 40 mmHg 40 mmHg 30 mmHg  Ultrafiltration Rate (mL/min) 400 mL/min 400 mL/min 0 mL/min  Dialysate Flow Rate (mL/min) 300 ml/min 300 ml/min 300 ml/min  Conductivity: Machine  13.8 13.8 13.8  HD Safety Checks Performed Yes Yes Yes  Intra-Hemodialysis Comments Progressing as prescribed Progressing as prescribed  (UF off d/t BP trend going down. pt asymptomatic)    06/12/21 2020 06/12/21 2030 06/12/21 2033  Vitals  BP 104/69 99/64  --   MAP (mmHg) 81 75  --   Pulse Rate 74 75  --   ECG Heart Rate 75 76  --   Resp 13 15  --   During Hemodialysis Assessment  Blood Flow Rate (mL/min)  --  200 mL/min  --   Arterial Pressure (mmHg)  --  -70 mmHg  --   Venous Pressure (mmHg)  --  40 mmHg  --   Transmembrane Pressure (mmHg)  --  30 mmHg  --   Ultrafiltration Rate (mL/min)  --  0 mL/min  --   Dialysate Flow Rate (mL/min)  --  300 ml/min  --   Conductivity: Machine   --  13.8  --   HD Safety Checks Performed  --  Yes  --   Intra-Hemodialysis Comments  (uf turned on) Tolerated well Tx completed

## 2021-06-15 NOTE — Progress Notes (Signed)
SUBJECTIVE: Jonathan Mcmillan is a 26 y.o. male with medical history including diabetes, CKD, HTN, nicotine dependence admitted on 05/31/2021 with AKI on CKD, anemia, and fevers/chills/myalgias . Patient suffered cardiac arrest on the morning of 1/19 and was ultimately intubated and transferred to the ICU. Patient extubated 06/08/21. Patient transferred to step down unit on 06/13/21.   Patient alert and oriented, responding to questions appropriately. Denies chest pain, shortness of breath.   Vitals:   06/14/21 1323 06/14/21 1554 06/14/21 2131 06/15/21 0806  BP: (!) 152/89 136/87 (!) 142/98 (!) 143/99  Pulse: 80 75 75 73  Resp: 18 18 18    Temp: 97.7 F (36.5 C) (!) 97.4 F (36.3 C) 97.7 F (36.5 C) (!) 97.4 F (36.3 C)  TempSrc: Oral Oral Oral Oral  SpO2: 100% 100% 100% 100%  Weight:      Height:        Intake/Output Summary (Last 24 hours) at 06/15/2021 0900 Last data filed at 06/15/2021 0100 Gross per 24 hour  Intake 0 ml  Output 500 ml  Net -500 ml    LABS: Basic Metabolic Panel: Recent Labs    06/13/21 0404 06/14/21 0346 06/15/21 0402  NA 133* 130* 136  K 3.6 4.3 4.3  CL 100 98 105  CO2 24 22 24   GLUCOSE 50* 267* 146*  BUN 56* 68* 51*  CREATININE 2.81* 3.54* 3.33*  CALCIUM 6.6* 6.3* 6.5*  MG 2.4 2.6*  --   PHOS 3.7 4.3  --    Liver Function Tests: Recent Labs    06/13/21 0404 06/14/21 0346  ALBUMIN 1.8* 1.7*   No results for input(s): LIPASE, AMYLASE in the last 72 hours. CBC: Recent Labs    06/14/21 0346 06/15/21 0402  WBC 9.3 8.2  HGB 9.2* 8.8*  HCT 26.9* 26.2*  MCV 87.3 87.3  PLT 50* 58*   Cardiac Enzymes: No results for input(s): CKTOTAL, CKMB, CKMBINDEX, TROPONINI in the last 72 hours. BNP: Invalid input(s): POCBNP D-Dimer: No results for input(s): DDIMER in the last 72 hours. Hemoglobin A1C: No results for input(s): HGBA1C in the last 72 hours. Fasting Lipid Panel: No results for input(s): CHOL, HDL, LDLCALC, TRIG, CHOLHDL, LDLDIRECT in the  last 72 hours. Thyroid Function Tests: No results for input(s): TSH, T4TOTAL, T3FREE, THYROIDAB in the last 72 hours.  Invalid input(s): FREET3 Anemia Panel: No results for input(s): VITAMINB12, FOLATE, FERRITIN, TIBC, IRON, RETICCTPCT in the last 72 hours.   PHYSICAL EXAM General: Well developed, well nourished, in no acute distress HEENT:  Normocephalic and atramatic Neck:  No JVD.  Lungs: Clear bilaterally to auscultation and percussion. Heart: HRRR . Normal S1 and S2 without gallops or murmurs.  Abdomen: Bowel sounds are positive, abdomen soft and non-tender  Msk:  Back normal, normal gait. Normal strength and tone for age. Extremities: No clubbing, cyanosis or edema.   Neuro: Alert and oriented X 3. Psych:  Good affect, responds appropriately  TELEMETRY: sinus rhythm, HR 70 bpm  ASSESSMENT AND PLAN: Status post atrial fibrillation with rapid ventricular response rate currently on p.o. amiodarone 200 twice daily advised changing to 200 mg daily.  Status post cardiac arrest due to asystole now extubated and much more alert and oriented.  Principal Problem:   Septic shock ([place]) Active Problems:   Hypertension   Acute kidney injury superimposed on CKD (HCC)   Type 1 diabetes mellitus with complication (HCC)   Nicotine dependence   Symptomatic anemia   Cardiopulmonary arrest ([place])   Acute respiratory failure  with hypoxia (HCC)   Thrombocytopenia (HCC)   Anemia in chronic kidney disease, on chronic dialysis (HCC)   Paroxysmal atrial fibrillation (HCC)   Chronic combined systolic and diastolic CHF (congestive heart failure) (HCC)   Elevated troponin   Hypocalcemia   Pressure injury of buttock, stage 2 ([place])    Jonathan Deleeuw, FNP-C 06/15/2021 9:00 AM

## 2021-06-15 NOTE — Progress Notes (Signed)
°   06/14/21 1115 06/14/21 1130 06/14/21 1145  Vitals  Temp  --   --   --   Temp Source  --   --   --   BP 96/61 107/61 110/64  MAP (mmHg) 73 74 79  Pulse Rate (!) 104 (!) 246  --   ECG Heart Rate 82 83 81  Resp 17 16 17   During Hemodialysis Assessment  Blood Flow Rate (mL/min) 250 mL/min 250 mL/min 250 mL/min  Arterial Pressure (mmHg) -80 mmHg -80 mmHg -80 mmHg  Venous Pressure (mmHg) 60 mmHg 70 mmHg 70 mmHg  Transmembrane Pressure (mmHg) 50 mmHg 50 mmHg 50 mmHg  Ultrafiltration Rate (mL/min) 400 mL/min 400 mL/min 400 mL/min  Dialysate Flow Rate (mL/min) 500 ml/min 500 ml/min 500 ml/min  Conductivity: Machine  13.8 13.8 13.8  HD Safety Checks Performed Yes Yes Yes  Intra-Hemodialysis Comments Progressing as prescribed Progressing as prescribed Progressing as prescribed    06/14/21 1200 06/14/21 1215 06/14/21 1218  Vitals  Temp  --   --  97.9 F (36.6 C)  Temp Source  --   --  Oral  BP (!) 95/58 108/63 111/66  MAP (mmHg) 69 78 78  Pulse Rate  --   --   --   ECG Heart Rate 79 79 79  Resp 17 14 17   During Hemodialysis Assessment  Blood Flow Rate (mL/min) 250 mL/min 250 mL/min  --   Arterial Pressure (mmHg) -80 mmHg -80 mmHg  --   Venous Pressure (mmHg) 70 mmHg 70 mmHg  --   Transmembrane Pressure (mmHg) 50 mmHg 50 mmHg  --   Ultrafiltration Rate (mL/min) 400 mL/min 400 mL/min  --   Dialysate Flow Rate (mL/min) 500 ml/min 500 ml/min  --   Conductivity: Machine  13.7 13.7  --   HD Safety Checks Performed Yes Yes  --   Intra-Hemodialysis Comments Progressing as prescribed Progressing as prescribed Tx completed

## 2021-06-15 NOTE — Progress Notes (Signed)
°   06/14/21 0945 06/14/21 1000 06/14/21 1015  Vitals  BP  --  123/79 94/61  MAP (mmHg)  --  92 72  Pulse Rate 73 77 79  ECG Heart Rate 73 77 80  Resp (!) 22 14 12   During Hemodialysis Assessment  Blood Flow Rate (mL/min) 250 mL/min 250 mL/min 250 mL/min  Arterial Pressure (mmHg) -70 mmHg -70 mmHg -70 mmHg  Venous Pressure (mmHg) 70 mmHg 70 mmHg 70 mmHg  Transmembrane Pressure (mmHg) 60 mmHg 50 mmHg 50 mmHg  Ultrafiltration Rate (mL/min) 400 mL/min 400 mL/min 400 mL/min  Dialysate Flow Rate (mL/min) 500 ml/min 500 ml/min 500 ml/min  Conductivity: Machine  13.7 13.7 13.7  HD Safety Checks Performed Yes Yes Yes  Dialysis Fluid Bolus Normal Saline  --   --   Bolus Amount (mL) 250 mL  --   --   Intra-Hemodialysis Comments Tx initiated Progressing as prescribed Progressing as prescribed    06/14/21 1030 06/14/21 1045 06/14/21 1100  Vitals  BP 102/65 102/64 103/63  MAP (mmHg) 77 76 76  Pulse Rate  --  77  --   ECG Heart Rate 79 77 78  Resp 15 17 16   During Hemodialysis Assessment  Blood Flow Rate (mL/min) 250 mL/min 250 mL/min 250 mL/min  Arterial Pressure (mmHg) -70 mmHg -70 mmHg -70 mmHg  Venous Pressure (mmHg) 70 mmHg 70 mmHg 60 mmHg  Transmembrane Pressure (mmHg) 50 mmHg 50 mmHg 50 mmHg  Ultrafiltration Rate (mL/min) 400 mL/min 400 mL/min 400 mL/min  Dialysate Flow Rate (mL/min) 500 ml/min 500 ml/min 500 ml/min  Conductivity: Machine  13.7 13.8 13.8  HD Safety Checks Performed Yes Yes Yes  Dialysis Fluid Bolus  --   --   --   Bolus Amount (mL)  --   --   --   Intra-Hemodialysis Comments Progressing as prescribed Progressing as prescribed Progressing as prescribed

## 2021-06-15 NOTE — Progress Notes (Signed)
Inpatient Diabetes Program Recommendations  AACE/ADA: New Consensus Statement on Inpatient Glycemic Control (2015)  Target Ranges:  Prepandial:   less than 140 mg/dL      Peak postprandial:   less than 180 mg/dL (1-2 hours)      Critically ill patients:  140 - 180 mg/dL   Lab Results  Component Value Date   GLUCAP 58 (L) 06/15/2021   HGBA1C 5.7 (H) 05/31/2021    Review of Glycemic Control  Latest Reference Range & Units 06/14/21 21:20 06/14/21 23:57 06/15/21 05:29 06/15/21 08:05 06/15/21 11:25  Glucose-Capillary 70 - 99 mg/dL 122 (H) 174 (H) 108 (H) 91 58 (L)  Diabetes history: DM  Home DM Meds: NOT taking Lantus   Current Orders: Semglee 5 units QHS                            Novolog 0-15 tid with meals  Inpatient Diabetes Program Recommendations:    Consider reducing Novolog to sensitive tid with meals. Also consider reducing Semglee to 4 units q HS.   Thanks,  Adah Perl, RN, BC-ADM Inpatient Diabetes Coordinator Pager [phone]  (8a-5p)

## 2021-06-15 NOTE — Progress Notes (Signed)
Patient tolerated procedure for permcath placement well and without incident. Received 1mg  Versed and 50 mg Fentanyl for procedure. Awake on arrival back to room 7 in [facility]. Patient does not want anything to eat at this time. Will call report to primary care nurse on MedSurg floor when recovery time per protocol is complete.

## 2021-06-15 NOTE — Progress Notes (Signed)
°   06/12/21 1800 06/12/21 1815 06/12/21 1830  Vitals  Temp (!) 93.3 F (34.1 C) (ICU nurse made aware. warm blanket was provided)  --   --   Temp Source Oral  --   --   BP 120/90 (!) 134/95 (!) 126/91  MAP (mmHg) 101 107 102  BP Location Left Arm  --   --   BP Method Automatic  --   --   Patient Position (if appropriate) Lying  --   --   Pulse Rate 62 63 64  ECG Heart Rate 62 63 65  Resp 14 19 11   During Hemodialysis Assessment  Blood Flow Rate (mL/min) 200 mL/min 200 mL/min 200 mL/min  Arterial Pressure (mmHg) -50 mmHg -50 mmHg -60 mmHg  Venous Pressure (mmHg) 30 mmHg 30 mmHg 40 mmHg  Transmembrane Pressure (mmHg) 40 mmHg 40 mmHg 40 mmHg  Ultrafiltration Rate (mL/min) 400 mL/min 400 mL/min 400 mL/min  Dialysate Flow Rate (mL/min) 300 ml/min 300 ml/min 300 ml/min  Conductivity: Machine  13.8 13.8 13.8  HD Safety Checks Performed Yes Yes Yes  Dialysis Fluid Bolus Normal Saline  --   --   Bolus Amount (mL) 250 mL  --   --   Intra-Hemodialysis Comments Tx initiated Progressing as prescribed Progressing as prescribed    06/12/21 1845 06/12/21 1900 06/12/21 1915  Vitals  Temp (!) 94.2 F (34.6 C) (bear hugger and warm blankets remain on)  --   --   Temp Source Oral  --   --   BP 113/82 114/78 112/76  MAP (mmHg) 91 88 86  BP Location  --   --   --   BP Method  --   --   --   Patient Position (if appropriate)  --   --   --   Pulse Rate 64 65 66  ECG Heart Rate 64 65 66  Resp 14 10 (!) 23  During Hemodialysis Assessment  Blood Flow Rate (mL/min) 200 mL/min 200 mL/min 200 mL/min  Arterial Pressure (mmHg) -70 mmHg -70 mmHg -70 mmHg  Venous Pressure (mmHg) 40 mmHg 40 mmHg 30 mmHg  Transmembrane Pressure (mmHg) 40 mmHg 40 mmHg 40 mmHg  Ultrafiltration Rate (mL/min) 400 mL/min 400 mL/min 400 mL/min  Dialysate Flow Rate (mL/min) 300 ml/min 300 ml/min 300 ml/min  Conductivity: Machine  13.8 13.8 13.8  HD Safety Checks Performed Yes Yes Yes  Dialysis Fluid Bolus  --   --   --    Bolus Amount (mL)  --   --   --   Intra-Hemodialysis Comments Progressing as prescribed Progressing as prescribed Progressing as prescribed

## 2021-06-15 NOTE — Progress Notes (Signed)
[facility]  ROUNDING NOTE   Subjective:   Patient seen laying in bed, alert and oriented Currently n.p.o. for PermCath placement Denies shortness of breath  Dialysis received yesterday, tolerated treatment well  Objective:  Vital signs in last 24 hours:  Temp:  [97.4 F (36.3 C)-97.9 F (36.6 C)] 97.4 F (36.3 C) (02/02 0806) Pulse Rate:  [73-246] 73 (02/02 0806) Resp:  [14-18] 18 (02/01 2131) BP: (95-152)/(58-99) 143/99 (02/02 0806) SpO2:  [100 %] 100 % (02/02 0806) Weight:  [64.7 kg] 64.7 kg (02/01 1218)  Weight change: 0 kg Filed Weights   06/14/21 0500 06/14/21 0941 06/14/21 1218  Weight: 63.7 kg 63.7 kg 64.7 kg    Intake/Output: I/O last 3 completed shifts: In: 0  Out: 500 [Other:500]   Intake/Output this shift:  No intake/output data recorded.  Physical Exam: General: No acute distress  Eyes: Anicteric  Lungs:  Diminished in bases normal effort  Heart: S1S2, no rubs  Abdomen:  Soft, nontender, BS present  Extremities: Trace peripheral edema.  Neurologic: Awake, alert, no rbus  Skin: Peeling skin noted  Access:  Right femoral temp cath  Rectal tube, external catheter   Basic Metabolic Panel: Recent Labs  Lab 06/10/21 0500 06/11/21 0334 06/12/21 0339 06/13/21 0404 06/14/21 0346 06/15/21 0402  NA 134* 135 134* 133* 130* 136  K 3.8 3.3* 3.8 3.6 4.3 4.3  CL 103 104 104 100 98 105  CO2 _0 GLUCOSE 161* 164* 151* 50* 267* 146*  BUN 36* 54* 69* 56* 68* 51*  CREATININE 1.75* 2.30* 2.71* 2.81* 3.54* 3.33*  CALCIUM 6.7* 6.8* 6.5* 6.6* 6.3* 6.5*  MG 2.5* 2.7* 2.8* 2.4 2.6*  --   PHOS 3.0 3.2 4.4 3.7 4.3  --      Liver Function Tests: Recent Labs  Lab 06/09/21 0411 06/09/21 1600 06/10/21 0500 06/11/21 0334 06/12/21 0339 06/13/21 0404 06/14/21 0346  AST 25  --   --   --  16  --   --   ALT 29  --   --   --  6  --   --   ALKPHOS 194*  --   --   --  158*  --   --   BILITOT 2.7*  --   --   --  1.3*  --   --   PROT  4.8*  --   --   --  5.0*  --   --   ALBUMIN 1.6*   1.7*   < > 1.7* 1.8* 1.7* 1.8* 1.7*   < > = values in this interval not displayed.    No results for input(s): LIPASE, AMYLASE in the last 168 hours.  No results for input(s): AMMONIA in the last 168 hours.   CBC: Recent Labs  Lab 06/11/21 0334 06/12/21 0339 06/13/21 0404 06/14/21 0346 06/15/21 0402  WBC 18.7* 13.5* 9.9 9.3 8.2  HGB 8.1* 8.6* 9.5* 9.2* 8.8*  HCT 23.9* 25.1* 28.3* 26.9* 26.2*  MCV 87.2 85.7 87.6 87.3 87.3  PLT 35* 38* 46* 50* 58*     Cardiac Enzymes: No results for input(s): CKTOTAL, CKMB, CKMBINDEX, TROPONINI in the last 168 hours.   BNP: Invalid input(s): POCBNP  CBG: Recent Labs  Lab 06/14/21 1655 06/14/21 2120 06/14/21 2357 06/15/21 0529 06/15/21 0805  GLUCAP 135* 122* 174* 108* 91     Microbiology: Results for orders placed or performed during the hospital encounter of 05/31/21  Resp Panel by RT-PCR (Flu  A&B, Covid) Nasopharyngeal Swab     Status: None   Collection Time: 05/31/21  6:58 AM   Specimen: Nasopharyngeal Swab; Nasopharyngeal(NP) swabs in vial transport medium  Result Value Ref Range Status   SARS Coronavirus 2 by RT PCR NEGATIVE NEGATIVE Final    Comment: (NOTE) SARS-CoV-2 target nucleic acids are NOT DETECTED.  The SARS-CoV-2 RNA is generally detectable in upper respiratory specimens during the acute phase of infection. The lowest concentration of SARS-CoV-2 viral copies this assay can detect is 138 copies/mL. A negative result does not preclude SARS-Cov-2 infection and should not be used as the sole basis for treatment or other patient management decisions. A negative result may occur with  improper specimen collection/handling, submission of specimen other than nasopharyngeal swab, presence of viral mutation(s) within the areas targeted by this assay, and inadequate number of viral copies(<138 copies/mL). A negative result must be combined with clinical observations,  patient history, and epidemiological information. The expected result is Negative.  Fact Sheet for Patients:  EntrepreneurPulse.com.au  Fact Sheet for Healthcare Providers:  IncredibleEmployment.be  This test is no t yet approved or cleared by the [place] FDA and  has been authorized for detection and/or diagnosis of SARS-CoV-2 by FDA under an Emergency Use Authorization (EUA). This EUA will remain  in effect (meaning this test can be used) for the duration of the COVID-19 declaration under Section 564(b)(1) of the Act, 21 U.S.C.section 360bbb-3(b)(1), unless the authorization is terminated  or revoked sooner.       Influenza A by PCR NEGATIVE NEGATIVE Final   Influenza B by PCR NEGATIVE NEGATIVE Final    Comment: (NOTE) The Xpert Xpress SARS-CoV-2/FLU/RSV plus assay is intended as an aid in the diagnosis of influenza from Nasopharyngeal swab specimens and should not be used as a sole basis for treatment. Nasal washings and aspirates are unacceptable for Xpert Xpress SARS-CoV-2/FLU/RSV testing.  Fact Sheet for Patients: EntrepreneurPulse.com.au  Fact Sheet for Healthcare Providers: IncredibleEmployment.be  This test is not yet approved or cleared by the [place] FDA and has been authorized for detection and/or diagnosis of SARS-CoV-2 by FDA under an Emergency Use Authorization (EUA). This EUA will remain in effect (meaning this test can be used) for the duration of the COVID-19 declaration under Section 564(b)(1) of the Act, 21 U.S.C. section 360bbb-3(b)(1), unless the authorization is terminated or revoked.  Performed at [facility], [place], [place] [address]   Respiratory (~20 pathogens) panel by PCR     Status: None   Collection Time: 05/31/21  6:58 AM   Specimen: Nasopharyngeal Swab; Respiratory  Result Value Ref Range Status   Adenovirus NOT DETECTED NOT  DETECTED Final   Coronavirus 229E NOT DETECTED NOT DETECTED Final    Comment: (NOTE) The Coronavirus on the Respiratory Panel, DOES NOT test for the novel  Coronavirus (2019 nCoV)    Coronavirus HKU1 NOT DETECTED NOT DETECTED Final   Coronavirus NL63 NOT DETECTED NOT DETECTED Final   Coronavirus OC43 NOT DETECTED NOT DETECTED Final   Metapneumovirus NOT DETECTED NOT DETECTED Final   Rhinovirus / Enterovirus NOT DETECTED NOT DETECTED Final   Influenza A NOT DETECTED NOT DETECTED Final   Influenza B NOT DETECTED NOT DETECTED Final   Parainfluenza Virus 1 NOT DETECTED NOT DETECTED Final   Parainfluenza Virus 2 NOT DETECTED NOT DETECTED Final   Parainfluenza Virus 3 NOT DETECTED NOT DETECTED Final   Parainfluenza Virus 4 NOT DETECTED NOT DETECTED Final   Respiratory Syncytial Virus  NOT DETECTED NOT DETECTED Final   Bordetella pertussis NOT DETECTED NOT DETECTED Final   Bordetella Parapertussis NOT DETECTED NOT DETECTED Final   Chlamydophila pneumoniae NOT DETECTED NOT DETECTED Final   Mycoplasma pneumoniae NOT DETECTED NOT DETECTED Final    Comment: Performed at [facility], [place] [address], [place], [place] [address]  Group A Strep by PCR [facility] Only)     Status: None   Collection Time: 05/31/21  8:35 AM   Specimen: Throat; Sterile Swab  Result Value Ref Range Status   Group A Strep by PCR NOT DETECTED NOT DETECTED Final    Comment: Performed at [facility], [address] [place], [place] [address]  Blood culture (routine x 2)     Status: None   Collection Time: 05/31/21  8:39 AM   Specimen: BLOOD  Result Value Ref Range Status   Specimen Description BLOOD BLOOD RIGHT FOREARM  Final   Special Requests   Final    BOTTLES DRAWN AEROBIC AND ANAEROBIC Blood Culture results may not be optimal due to an excessive volume of blood received in culture bottles   Culture   Final    NO GROWTH 5 DAYS Performed at [facility], [address] [place], [place]  [address]    Report Status 06/05/2021 FINAL  Final  Blood culture (routine x 2)     Status: None   Collection Time: 05/31/21  8:39 AM   Specimen: BLOOD  Result Value Ref Range Status   Specimen Description BLOOD RIGHT ANTECUBITAL  Final   Special Requests   Final    BOTTLES DRAWN AEROBIC AND ANAEROBIC Blood Culture results may not be optimal due to an excessive volume of blood received in culture bottles   Culture   Final    NO GROWTH 5 DAYS Performed at [facility], [address] [place], [place] [address]    Report Status 06/05/2021 FINAL  Final  C Difficile Quick Screen w PCR reflex     Status: None   Collection Time: 05/31/21  1:20 PM   Specimen: Stool  Result Value Ref Range Status   C Diff antigen NEGATIVE NEGATIVE Final   C Diff toxin NEGATIVE NEGATIVE Final   C Diff interpretation No C. difficile detected.  Final    Comment: Performed at [facility], [address] [place], [place] [address]  Gastrointestinal Panel by PCR , Stool     Status: None   Collection Time: 05/31/21  1:30 PM   Specimen: Stool  Result Value Ref Range Status   Campylobacter species NOT DETECTED NOT DETECTED Final   Plesimonas shigelloides NOT DETECTED NOT DETECTED Final   Salmonella species NOT DETECTED NOT DETECTED Final   Yersinia enterocolitica NOT DETECTED NOT DETECTED Final   Vibrio species NOT DETECTED NOT DETECTED Final   Vibrio cholerae NOT DETECTED NOT DETECTED Final   Enteroaggregative E coli (EAEC) NOT DETECTED NOT DETECTED Final   Enteropathogenic E coli (EPEC) NOT DETECTED NOT DETECTED Final   Enterotoxigenic E coli (ETEC) NOT DETECTED NOT DETECTED Final   Shiga like toxin producing E coli (STEC) NOT DETECTED NOT DETECTED Final   Shigella/Enteroinvasive E coli (EIEC) NOT DETECTED NOT DETECTED Final   Cryptosporidium NOT DETECTED NOT DETECTED Final   Cyclospora cayetanensis NOT DETECTED NOT DETECTED Final   Entamoeba histolytica NOT DETECTED NOT DETECTED Final    Giardia lamblia NOT DETECTED NOT DETECTED Final   Adenovirus F40/41 NOT DETECTED NOT DETECTED Final   Astrovirus  NOT DETECTED NOT DETECTED Final   Norovirus GI/GII NOT DETECTED NOT DETECTED Final   Rotavirus A NOT DETECTED NOT DETECTED Final   Sapovirus (I, II, IV, and V) NOT DETECTED NOT DETECTED Final    Comment: Performed at [facility], [address] [place], [place] [address]  Urine Culture     Status: None   Collection Time: 06/01/21 10:23 AM   Specimen: In/Out Cath Urine  Result Value Ref Range Status   Specimen Description   Final    IN/OUT CATH URINE Performed at [facility], [address], [place], [place] [address]    Special Requests   Final    NONE Performed at [facility], [address], [place], [place] [address]    Culture   Final    NO GROWTH Performed at [facility], [place] [address], [place], [place] [address]    Report Status 06/03/2021 FINAL  Final  Culture, Respiratory w Gram Stain     Status: None   Collection Time: 06/01/21  3:59 PM   Specimen: Tracheal Aspirate; Respiratory  Result Value Ref Range Status   Specimen Description   Final    TRACHEAL ASPIRATE Performed at [facility], [address], [place], [place] [address]    Special Requests   Final    NONE Performed at [facility] [place], [place] [address]    Gram Stain   Final    RARE SQUAMOUS EPITHELIAL CELLS PRESENT MODERATE WBC PRESENT,BOTH PMN AND MONONUCLEAR MODERATE GRAM POSITIVE COCCI FEW GRAM NEGATIVE RODS FEW GRAM POSITIVE RODS RARE YEAST    Culture   Final    FEW Consistent with normal respiratory flora. No Pseudomonas species isolated Performed at [facility] [address], [place], [place] [address]    Report Status 06/03/2021 FINAL  Final  CULTURE, BLOOD (ROUTINE X 2) w Reflex to ID Panel     Status: None   Collection Time: 06/01/21  7:06 PM   Specimen: BLOOD  Result Value Ref Range  Status   Specimen Description BLOOD RIGHT ANTECUBITAL  Final   Special Requests   Final    BOTTLES DRAWN AEROBIC ONLY Blood Culture results may not be optimal due to an inadequate volume of blood received in culture bottles   Culture   Final    NO GROWTH 5 DAYS Performed at [facility], [address] [place], [place] [address]    Report Status 06/06/2021 FINAL  Final  CULTURE, BLOOD (ROUTINE X 2) w Reflex to ID Panel     Status: None   Collection Time: 06/01/21  7:37 PM   Specimen: BLOOD  Result Value Ref Range Status   Specimen Description BLOOD [facility]  Final   Special Requests BOTTLES DRAWN AEROBIC AND ANAEROBIC BCLV  Final   Culture   Final    NO GROWTH 5 DAYS Performed at [facility], [address] [place], [place] [address]    Report Status 06/06/2021 FINAL  Final  MRSA Next Gen by PCR, Nasal     Status: None   Collection Time: 06/02/21 10:46 AM   Specimen: Nasal Mucosa; Nasal Swab  Result Value Ref Range Status   MRSA by PCR Next Gen NOT DETECTED NOT DETECTED Final    Comment: (NOTE) The GeneXpert MRSA Assay (FDA approved for NASAL specimens only), is one component of a comprehensive MRSA colonization surveillance program. It is not intended to diagnose MRSA infection nor  to guide or monitor treatment for MRSA infections. Test performance is not FDA approved in patients less than 37 years old. Performed at [facility], [address] [place], [place] [address]   Culture, Respiratory w Gram Stain     Status: None   Collection Time: 06/04/21  7:38 AM   Specimen: Tracheal Aspirate; Respiratory  Result Value Ref Range Status   Specimen Description   Final    TRACHEAL ASPIRATE Performed at [facility], [address], [place], [place] [address]    Special Requests   Final    NONE Performed at [facility], [address] [place], [place] [address]    Gram Stain   Final    MODERATE WBC PRESENT,BOTH PMN AND  MONONUCLEAR NO ORGANISMS SEEN Performed at [facility], [place] [address], [place], [place] [address]    Culture FEW CANDIDA ALBICANS RARE ESCHERICHIA COLI   Final   Report Status 06/07/2021 FINAL  Final   Organism ID, Bacteria ESCHERICHIA COLI  Final      Susceptibility   Escherichia coli - MIC*    AMPICILLIN >=32 RESISTANT Resistant     CEFAZOLIN <=4 SENSITIVE Sensitive     CEFEPIME <=0.12 SENSITIVE Sensitive     CEFTAZIDIME <=1 SENSITIVE Sensitive     CEFTRIAXONE <=0.25 SENSITIVE Sensitive     CIPROFLOXACIN <=0.25 SENSITIVE Sensitive     GENTAMICIN <=1 SENSITIVE Sensitive     IMIPENEM <=0.25 SENSITIVE Sensitive     TRIMETH/SULFA >=320 RESISTANT Resistant     AMPICILLIN/SULBACTAM >=32 RESISTANT Resistant     PIP/TAZO <=4 SENSITIVE Sensitive     * RARE ESCHERICHIA COLI  CULTURE, BLOOD (ROUTINE X 2) w Reflex to ID Panel     Status: None   Collection Time: 06/04/21  6:35 PM   Specimen: BLOOD  Result Value Ref Range Status   Specimen Description BLOOD BLOOD LEFT HAND  Final   Special Requests   Final    BOTTLES DRAWN AEROBIC AND ANAEROBIC Blood Culture adequate volume   Culture   Final    NO GROWTH 5 DAYS Performed at [facility], [address], [place],  [address]    Report Status 06/09/2021 FINAL  Final    Coagulation Studies: No results for input(s): LABPROT, INR in the last 72 hours.   Urinalysis: No results for input(s): COLORURINE, LABSPEC, PHURINE, GLUCOSEU, HGBUR, BILIRUBINUR, KETONESUR, PROTEINUR, UROBILINOGEN, NITRITE, LEUKOCYTESUR in the last 72 hours.  Invalid input(s): APPERANCEUR     Imaging: No results found.   Medications:    sodium chloride Stopped (06/12/21 2313)   [START ON 06/16/2021]  ceFAZolin (ANCEF) IV      ceFAZolin (ANCEF) IV      amiodarone  200 mg Oral BID   vitamin C  500 mg Oral BID   chlorhexidine gluconate (MEDLINE KIT)  15 mL Mouth Rinse BID   Chlorhexidine Gluconate Cloth  6 each Topical Daily    Chlorhexidine Gluconate Cloth  6 each Topical Q0600   collagenase   Topical Daily   feeding supplement (NEPRO CARB STEADY)  237 mL Oral BID BM   hydrocerin   Topical BID   insulin aspart  0-15 Units Subcutaneous TID WC   insulin glargine-yfgn  5 Units Subcutaneous QHS   mouth rinse  15 mL Mouth Rinse BID   multivitamin  1 tablet Oral QHS   pantoprazole  40 mg Oral Daily   sodium chloride flush  10-40 mL Intracatheter Q12H   thiamine  injection  100 mg Intravenous Q24H   zinc sulfate  220 mg Oral Daily   sodium chloride, docusate, fentaNYL (SUBLIMAZE) injection, guaiFENesin-dextromethorphan, ipratropium-albuterol, lip balm, ondansetron **OR** ondansetron (ZOFRAN) IV, oxyCODONE, polyvinyl alcohol, sodium chloride flush  Assessment/ Plan:  [name] is a 26 y.o. black male with hypertension, insulin dependent diabetes mellitus type I, diabetic gastroparesis, diabetic neuropathy, tobacco use, THC use, right toe amputation, perirectal abscess, who is admitted to [facility] on 05/31/2021 for SIRS (systemic inflammatory response syndrome) ([place]) [R65.10] AKI (acute kidney injury) ([place]) [N17.9] Symptomatic anemia [D64.9] Acute renal failure superimposed on stage 5 chronic kidney disease, not on chronic dialysis, unspecified acute renal failure type ([place]) [N17.9, N18.5] Hypertension, unspecified type [I10] Acute cough [R05.1]  Cardiac arrest with code blue 06/01/2021 at 10 am. Transferred to ICU. Intubated  Acute kidney injury on chronic kidney disease stage IV versus progression of chronic kidney disease to end stage renal disease. Baseline creatinine of 4.13, GFR of 19 on 08/26/20. History of nephrotic range proteinuria and hematuria. No history of renal biopsy. Strong family history of dialysis with mother with ESRD before passing. Was on CRRT.  -Received dialysis yesterday, UF goal 500 mL achieved.  Tolerated treatment well - Vascular surgery to place PermCath today - After placement we will  place order for removal of temp cath -Next treatment scheduled for Friday -Dialysis coordinator aware of patient and outpatient placement currently in process    Acute respiratory failure Extubated then re-intubated 06/03/2021; extubated 06/08/2021 -Denies shortness of breath  Hypotension with cardiogenic shock -Blood pressure 143/99  -Remains on amiodarone  4. Insulin dependent Diabetes type 1 with CKD and proteinuria   Lab Results  Component Value Date   HGBA1C 5.7 (H) 05/31/2021  Glucose stable, primary team to manage SSI  5. Thrombocytopenia Hematology evaluation on 1/22 Suspected peripheral destruction Platelets continue to slowly increase, 58,000 today    LOS: [place] 2/2/202310:32 AM

## 2021-06-15 NOTE — TOC Initial Note (Signed)
Transition of Care [facility]) - Initial/Assessment Note    Patient Details  Name: Jonathan Mcmillan MRN: [id number] Date of Birth: 1995-07-16  Transition of Care [facility]) CM/SW Contact:    Beverly Sessions, RN Phone Number: 06/15/2021, 3:25 PM  Clinical Narrative:                 Admitted for: sepsis.  S/p extubation Admitted from:home with Aunt QHU:[id number]/Medication Management   Current home health/prior home health/DME: glucometer  Therapy recommending CIR.  Plan to remove groin temp cath, and have perm cath placed today for new outpatient HD.  HD coordinator aware. PT and OT will be able to work more with the patient when temp cath is removed.  Patient states that he would be interested in CIR if he is a candidate    Expected Discharge Plan: IP Rehab Facility Barriers to Discharge: Continued Medical Work up   Patient Goals and CMS Choice Patient states their goals for this hospitalization and ongoing recovery are:: intubated      Expected Discharge Plan and Services Expected Discharge Plan: [place]   Discharge Planning Services: CM Consult   Living arrangements for the past 2 months: Single Family Home                                      Prior Living Arrangements/Services Living arrangements for the past 2 months: Single Family Home Lives with:: Relatives Patient language and need for interpreter reviewed:: Yes Do you feel safe going back to the place where you live?: Yes      Need for Family Participation in Patient Care: Yes (Comment) Care giver support system in place?: Yes (comment)   Criminal Activity/Legal Involvement Pertinent to Current Situation/Hospitalization: No - Comment as needed  Activities of Daily Living Home Assistive Devices/Equipment: None ADL Screening (condition at time of admission) Patient's cognitive ability adequate to safely complete daily activities?: Yes Is the patient deaf or have difficulty  hearing?: No Does the patient have difficulty seeing, even when wearing glasses/contacts?: No Does the patient have difficulty concentrating, remembering, or making decisions?: No Patient able to express need for assistance with ADLs?: Yes Does the patient have difficulty dressing or bathing?: No Independently performs ADLs?: Yes (appropriate for developmental age) Does the patient have difficulty walking or climbing stairs?: No Weakness of Legs: None Weakness of Arms/Hands: None  Permission Sought/Granted                  Emotional Assessment Appearance:: Appears older than stated age Attitude/Demeanor/Rapport: Intubated (Following Commands or Not Following Commands) Affect (typically observed): Unable to Assess Orientation: : Oriented to Self, Oriented to Place, Oriented to  Time, Oriented to Situation Alcohol / Substance Use: Not Applicable Psych Involvement: No (comment)  Admission diagnosis:  SIRS (systemic inflammatory response syndrome) (HCC) [R65.10] AKI (acute kidney injury) ([place]) [N17.9] Symptomatic anemia [D64.9] Acute renal failure superimposed on stage 5 chronic kidney disease, not on chronic dialysis, unspecified acute renal failure type ([place]) [N17.9, N18.5] Hypertension, unspecified type [I10] Acute cough [R05.1] Patient Active Problem List   Diagnosis Date Noted   Paroxysmal atrial fibrillation ([place]) 06/14/2021   Chronic combined systolic and diastolic CHF (congestive heart failure) ([place]) 06/14/2021   Elevated troponin 06/14/2021   Hypocalcemia 06/14/2021   Pressure injury of buttock, stage 2 ([place]) 06/14/2021   Anemia in chronic kidney disease, on chronic dialysis ([place])  Thrombocytopenia ([place])    Acute respiratory failure with hypoxia (H[facility])    Septic shock ([place])    Cardiopulmonary arrest ([place])    Type 1 diabetes mellitus with complication ([place]) 28/03/8866   Nicotine dependence 05/31/2021   Symptomatic anemia 05/31/2021   Acute kidney injury  superimposed on CKD ([place]) 06/11/2020   Polysubstance abuse ([place])    Malnutrition of moderate degree 05/22/2016   Hypertension 10/05/2010   PCP:  [place] [place]:   [id number] [place], [place] - [address] [place] [address] Phone: [phone] Fax: [phone]     Social Determinants of Health (SDOH) Interventions    Readmission Risk Interventions Readmission Risk Prevention Plan 06/13/2020  Transportation Screening Complete  Palliative Care Screening Not Applicable  Medication Review (RN Care Manager) Complete  Some recent data might be hidden

## 2021-06-15 NOTE — Interval H&P Note (Signed)
History and Physical Interval Note:  06/15/2021 2:45 PM  Jonathan Mcmillan  has presented today for surgery, with the diagnosis of end stage renal disease.  The various methods of treatment have been discussed with the patient and family. After consideration of risks, benefits and other options for treatment, the patient has consented to  Procedure(s): DIALYSIS/PERMA CATHETER INSERTION (N/A) as a surgical intervention.  The patient's history has been reviewed, patient examined, no change in status, stable for surgery.  I have reviewed the patient's chart and labs.  Questions were answered to the patient's satisfaction.     [name]

## 2021-06-15 NOTE — Op Note (Signed)
OPERATIVE NOTE    PRE-OPERATIVE DIAGNOSIS: 1. ESRD   POST-OPERATIVE DIAGNOSIS: same as above  PROCEDURE: Ultrasound guidance for vascular access to the right internal jugular vein Fluoroscopic guidance for placement of catheter Placement of a 19 cm tip to cuff tunneled hemodialysis catheter via the right internal jugular vein  SURGEON: [name], MD  ANESTHESIA:  Local with Moderate conscious sedation for approximately 21 minutes using 1 mg of Versed and 50 mcg of Fentanyl  ESTIMATED BLOOD LOSS: 5 cc  FLUORO TIME: less than one minute  CONTRAST: none  FINDING(S): 1.  Patent right internal jugular vein  SPECIMEN(S):  None  INDICATIONS:   Jonathan Mcmillan is a 26 y.o.male who presents with renal failure.  The patient needs long term dialysis access for their ESRD, and a Permcath is necessary.  Risks and benefits are discussed and informed consent is obtained.    DESCRIPTION: After obtaining full informed written consent, the patient was brought back to the vascular suited. The patient's right neck and chest were sterilely prepped and draped in a sterile surgical field was created. Moderate conscious sedation was administered during a face to face encounter with the patient throughout the procedure with my supervision of the RN administering medicines and monitoring the patient's vital signs, pulse oximetry, telemetry and mental status throughout from the start of the procedure until the patient was taken to the recovery room.  The right internal jugular vein was visualized with ultrasound and found to be patent. It was then accessed under direct ultrasound guidance and a permanent image was recorded. A wire was placed. After skin nick and dilatation, the peel-away sheath was placed over the wire. I then turned my attention to an area under the clavicle. Approximately 1-2 fingerbreadths below the clavicle a small counterincision was created and tunneled from the subclavicular incision  to the access site. Using fluoroscopic guidance, a 19 centimeter tip to cuff tunneled hemodialysis catheter was selected, and tunneled from the subclavicular incision to the access site. It was then placed through the peel-away sheath and the peel-away sheath was removed. Using fluoroscopic guidance the catheter tips were parked in the right atrium. The appropriate distal connectors were placed. It withdrew blood well and flushed easily with heparinized saline and a concentrated heparin solution was then placed. It was secured to the chest wall with 2 Prolene sutures. The access incision was closed single 4-0 Monocryl. A 4-0 Monocryl pursestring suture was placed around the exit site. Sterile dressings were placed. The patient tolerated the procedure well and was taken to the recovery room in stable condition.  COMPLICATIONS: None  CONDITION: Stable  [name], MD 06/15/2021 3:30 PM   This note was created with Dragon Medical transcription system. Any errors in dictation are purely unintentional.

## 2021-06-16 ENCOUNTER — Encounter: Payer: Self-pay | Admitting: Vascular Surgery

## 2021-06-16 LAB — CBC
HCT: 24.4 % — ABNORMAL LOW (ref 39.0–52.0)
Hemoglobin: 8.1 g/dL — ABNORMAL LOW (ref 13.0–17.0)
MCH: 29.5 pg (ref 26.0–34.0)
MCHC: 33.2 g/dL (ref 30.0–36.0)
MCV: 88.7 fL (ref 80.0–100.0)
Platelets: 63 10*3/uL — ABNORMAL LOW (ref 150–400)
RBC: 2.75 MIL/uL — ABNORMAL LOW (ref 4.22–5.81)
RDW: 15.7 % — ABNORMAL HIGH (ref 11.5–15.5)
WBC: 7 10*3/uL (ref 4.0–10.5)
nRBC: 0 % (ref 0.0–0.2)

## 2021-06-16 LAB — RENAL FUNCTION PANEL
Albumin: 1.6 g/dL — ABNORMAL LOW (ref 3.5–5.0)
Anion gap: 7 (ref 5–15)
BUN: 60 mg/dL — ABNORMAL HIGH (ref 6–20)
CO2: 23 mmol/L (ref 22–32)
Calcium: 6.3 mg/dL — CL (ref 8.9–10.3)
Chloride: 104 mmol/L (ref 98–111)
Creatinine, Ser: 4.31 mg/dL — ABNORMAL HIGH (ref 0.61–1.24)
GFR, Estimated: 18 mL/min — ABNORMAL LOW (ref >60)
Glucose, Bld: 207 mg/dL — ABNORMAL HIGH (ref 70–99)
Phosphorus: 4.5 mg/dL (ref 2.5–4.6)
Potassium: 4.9 mmol/L (ref 3.5–5.1)
Sodium: 134 mmol/L — ABNORMAL LOW (ref 135–145)

## 2021-06-16 LAB — GLUCOSE, CAPILLARY
Glucose-Capillary: 138 mg/dL — ABNORMAL HIGH (ref 70–99)
Glucose-Capillary: 141 mg/dL — ABNORMAL HIGH (ref 70–99)
Glucose-Capillary: 142 mg/dL — ABNORMAL HIGH (ref 70–99)
Glucose-Capillary: 80 mg/dL (ref 70–99)

## 2021-06-16 LAB — MAGNESIUM: Magnesium: 2.3 mg/dL (ref 1.7–2.4)

## 2021-06-16 MED ORDER — FERROUS SULFATE 325 (65 FE) MG PO TABS
325.0000 mg | ORAL_TABLET | Freq: Every day | ORAL | Status: DC
  Administered 2021-06-17 – 2021-06-22 (×5): 325 mg via ORAL
  Filled 2021-06-16 (×5): qty 1

## 2021-06-16 MED ORDER — CALCIUM CARBONATE 1250 (500 CA) MG PO TABS
1.0000 | ORAL_TABLET | Freq: Two times a day (BID) | ORAL | Status: AC
  Administered 2021-06-16 – 2021-06-17 (×2): 500 mg via ORAL
  Filled 2021-06-16 (×2): qty 1

## 2021-06-16 MED ORDER — SODIUM CHLORIDE 0.9 % IV SOLN
510.0000 mg | Freq: Once | INTRAVENOUS | Status: AC
  Administered 2021-06-16: 510 mg via INTRAVENOUS
  Filled 2021-06-16: qty 17

## 2021-06-16 MED ORDER — THIAMINE HCL 100 MG PO TABS
100.0000 mg | ORAL_TABLET | Freq: Every day | ORAL | Status: DC
  Administered 2021-06-16 – 2021-06-22 (×5): 100 mg via ORAL
  Filled 2021-06-16 (×6): qty 1

## 2021-06-16 NOTE — Progress Notes (Signed)
Patient completes hemodialysis treatment, tolerates seating in reclined chair. Femoral catheter functions minimally for remainder of treatment. VSS. No concerns, no meds given nor labs drawn. Patient continues with loose stool, rectal tube out. Report given to primary nurse, patient returned to assigned room. Patient noted to be in more jovial spirits post treatment.

## 2021-06-16 NOTE — Progress Notes (Addendum)
°  Inpatient Rehabilitation Admissions Coordinator   Noted plan for placement of Right IJ HD cath today and removal of femoral cath. Assessment for possible inpatient Hospital rehab/CIR admit can not be pursued until patient able to attempt mobility out of bed with transfers. We await therapy progress before placing rehab consult.  Danne Baxter, RN, MSN Rehab Admissions Coordinator [phone] 06/16/2021 10:13 AM  I will place rehab consult and my partner, Clemens Catholic, will begin Rehab assessment. We can not pursue admit to CIR though until femoral HD catheter removed.  Danne Baxter, RN, MSN Rehab Admissions Coordinator [phone] 06/16/2021 3:52 PM

## 2021-06-16 NOTE — Progress Notes (Signed)
Occupational Therapy Treatment Patient Details Name: Jonathan Mcmillan MRN: [id number] DOB: 04/07/1996 Today's Date: 06/16/2021   History of present illness Pt is a 26 y/o M admitted on 06/01/21 with AKI on CKD in setting of viral syndrome & diarrhea. Pt suffered in-hospital cardiac arrest, suspect due to severe metabolic acidosis & multiple metabolic derangements. Required CRRT and now preparing to undergo regular HD with R temp femoral catheter placed on 1/28. Pt was extubated on 1/21 but reintubated 1/22. Extubated again on 1/26 & now weaned to room air. PMH: DM2, CKD stage IV, HTN, nicotine dependence.   OT comments  Jonathan Mcmillan was seen for OT treatment on this date. Upon arrival to room pt reclined in HD chair having just returned, found to be incontinent of stool and RN requesting assist to return to bed and clean pt. Nephrology cleared pt for SPT, no walking, recline in chair no greater than 45-60*, monitor position - via secure chat.   Pt requires MOD A x2 chair>bed SPT with cues for safe sequencing, poor standing balance. MAX A don/doff B socks at bed level. MOD A bathing at bed level - pt completes UB with cues for thoroughness. MAX A x2 toileting at bed level - pt continues to be incontinent of stool - RN placed rectal tube. MIN A don/doff gown at bed level.   Pt making good progress toward goals, eager for OOB to chair daily. Pt continues to benefit from skilled OT services to maximize return to PLOF and minimize risk of future falls, injury, caregiver burden, and readmission. Will continue to follow POC. Discharge recommendation remains appropriate.     Recommendations for follow up therapy are one component of a multi-disciplinary discharge planning process, led by the attending physician.  Recommendations may be updated based on patient status, additional functional criteria and insurance authorization.    Follow Up Recommendations  Acute inpatient rehab (3hours/day)    Assistance  Recommended at Discharge Frequent or constant Supervision/Assistance  Patient can return home with the following  A lot of help with walking and/or transfers;A lot of help with bathing/dressing/bathroom;Assistance with cooking/housework;Direct supervision/assist for medications management;Assist for transportation;Help with stairs or ramp for entrance   Equipment Recommendations  Other (comment) (defer to next venue of care)    Recommendations for Other Services      Precautions / Restrictions Precautions Precautions: Fall Precaution Comments: R temporary femoral dialysis catheter, per Nephrology "SPT, no walking, <60* HOB or chair reclined" Restrictions Weight Bearing Restrictions: No       Mobility Bed Mobility Overal bed mobility: Needs Assistance Bed Mobility: Rolling, Sit to Supine Rolling: Min guard     Sit to supine: Mod assist   General bed mobility comments: assist for BLE and trunk control    Transfers Overall transfer level: Needs assistance Equipment used: 2 person hand held assist Transfers: Bed to chair/wheelchair/BSC   Stand pivot transfers: Mod assist, +2 physical assistance               Balance Overall balance assessment: Needs assistance Sitting-balance support: No upper extremity supported, Feet supported Sitting balance-Leahy Scale: Fair     Standing balance support: Bilateral upper extremity supported Standing balance-Leahy Scale: Poor                             ADL either performed or assessed with clinical judgement   ADL Overall ADL's : Needs assistance/impaired  General ADL Comments: MAX A don/doff B socks at bed level. MOD A bathing at bed level - pt completes UB with cues for thoroughness. MAX A x2 toileting at bed level - pt continues to be incontinent of stool - RN in to place rectal tube. MIN A don/doff gown at bed level. MOD A x2 for ADL t/f      Cognition  Arousal/Alertness: Awake/alert Behavior During Therapy: Flat affect Overall Cognitive Status: Within Functional Limits for tasks assessed                                                     Pertinent Vitals/ Pain       Pain Assessment Pain Assessment: No/denies pain   Frequency  Min 3X/week        Progress Toward Goals  OT Goals(current goals can now be found in the care plan section)     Acute Rehab OT Goals Patient Stated Goal: to be in chair OT Goal Formulation: With patient Time For Goal Achievement: 06/24/21 Potential to Achieve Goals: Fair  Plan      Co-evaluation                 AM-PAC OT "6 Clicks" Daily Activity     Outcome Measure   Help from another person eating meals?: A Little Help from another person taking care of personal grooming?: A Little Help from another person toileting, which includes using toliet, bedpan, or urinal?: A Lot Help from another person bathing (including washing, rinsing, drying)?: A Lot Help from another person to put on and taking off regular upper body clothing?: A Little Help from another person to put on and taking off regular lower body clothing?: A Lot 6 Click Score: 15    End of Session Equipment Utilized During Treatment: Gait belt  OT Visit Diagnosis: Muscle weakness (generalized) (M62.81);Adult, failure to thrive (R62.7)   Activity Tolerance Patient tolerated treatment well   Patient Left in bed;with call bell/phone within reach;with bed alarm set   Nurse Communication Mobility status        Time: 2563-8937 OT Time Calculation (min): 48 min  Charges: OT General Charges $OT Visit: 1 Visit OT Treatments $Self Care/Home Management : 38-52 mins  Dessie Coma, M.S. OTR/L  06/16/21, 3:43 PM  ascom [phone]

## 2021-06-16 NOTE — Progress Notes (Signed)
PROGRESS NOTE  HAU SANOR KHT:[id number] DOB: 1995-06-26 DOA: 05/31/2021 PCP: [facility] [place]  HPI/Recap of past 24 hours: Mr. Vandeusen is a 26 M with hx T1DM, CKD IV baseline Cr 1.8-2.2, HTN, THC use, who presented to [facility] ED with 3d history fever, chills, nonproductive cough, myalgia, diarrhea, admitted for worsening renal function.  On hospital day 2, patient became hypotensive, hypoglycemic had CODE BLUE asystole progressed to PEA, achieved ROSC and transferred to ICU.     Started treatment for sepsis, tracheal aspirate grew E. coli, started on CRRT, extubated 06/03/2021 but needed to be reintubated 06/04/2021 apparently due to large volume aspiration, worsening shock, multiorgan failure, and severe thrombocytopenia (not DIC).    Underwent platelet transfusion and went into A. fib RVR, was able to be extubated 06/08/2021.  Underwent several transfusions PRBCs/platelets.  Off CRRT since 2021/06/21.     Swallow study passed on day 3 post extubation and medically stable off CRRT. Transferred to hospitalist service on 06/13/21.  Notable severe skin sloughing.  Post right IJ permacath placement by IR on 06/15/21.  Plan for discharge to inpatient rehab once nephrology signs off.   06/16/2021: Patient was advised bedside.  There were no acute events overnight.  He has no new complaints.  Updated his aunt on Phone.  Assessment/Plan: Principal Problem:   Septic shock (HCC) Active Problems:   Hypertension   Acute kidney injury superimposed on CKD (HCC)   Type 1 diabetes mellitus with complication (HCC)   Nicotine dependence   Symptomatic anemia   Cardiopulmonary arrest ([place])   Acute respiratory failure with hypoxia (HCC)   Thrombocytopenia (HCC)   Anemia in chronic kidney disease, on chronic dialysis (HCC)   Paroxysmal atrial fibrillation (HCC)   Chronic combined systolic and diastolic CHF (congestive heart failure) (HCC)   Elevated troponin   Hypocalcemia   Pressure  injury of buttock, stage 2 (HCC)  Septic shock due to Ecoli pneumonia Completed 4 days Zosyn then 3 days Unasyn then 6 days Cefazolin.  Blood cultures on 1/18, 1/19, 1/22 negative. Sputum 1/22 with Ecoli  Now off Abx.  - Continue to monitor fever curve   Acute kidney injury superimposed on CKD IV (HCC) Chronic kidney disease stage IV Baseline Cr 1.8-2.2 due to type I diabetes.  Here, Renal function worsened, required CRRT, since stopping CRRT, renal function has not recovered.   Right IJ permacath placed on 06/15/2021 by vascular surgery Appreciate nephrology's and interventional radiology's assistance.   Chronic combined systolic and diastolic CHF (congestive heart failure) ([place]) Echo this hospitalization showed EF 35-40%.   - Fluid management per HD Defer goal-directed therapy by cardiology Continue to closely monitor on telemetry.  Resolved acute respiratory failure with hypoxia (HCC) Resolved   Paroxysmal atrial fibrillation (HCC) Not on AC due to thrombocytopenia -Continue amiodarone, per cardiology   Anemia in chronic kidney disease, on chronic dialysis ([place]) Hemoglobin stable No overt bleeding.   Thrombocytopenia (HCC) Platelet is uptrending 63K from 58K No overt bleeding noted.   Type 1 diabetes mellitus with complication (HCC) Glucose somewhat labile -Continue glargine -Continue SS corrections   Pressure injury RIGHT buttocks, stage 2, POA   Severe skin sloughing Will benefit from dermatology follow-up outpatient.  Severe iron deficiency anemia Iron studies showing severe iron deficiency Will benefit from IV iron supplement. IV Feraheme 1 dose on 06/16/2021 Ferrous sulfate 325 mg daily starting on 06/17/2021  Severe protein calorie malnutrition Muscle mass loss, severe BMI 21, albumin 1.6  Hypocalcemia Repleted orally  Repeat renal panel in AM       Code Status: Full code  Family Communication: Updated his aunt via phone on 06/16/2021.  Disposition  Plan: Likely will discharge to inpatient rehab after nephrology signs off.   Consultants: Vascular surgery Nephrology Cardiology  Procedures: CPR  Antimicrobials: None  DVT prophylaxis: None  Status is: Inpatient Patient requires at least 2 midnights for further evaluation and treatment.              Objective: Vitals:   06/16/21 1300 06/16/21 1315 06/16/21 1330 06/16/21 1342  BP: 108/65 106/71 122/83 (!) 134/91  Pulse:  79 79 80  Resp: 12 12 13 12   Temp:    97.7 F (36.5 C)  TempSrc:    Oral  SpO2:  100% 100% 100%  Weight:      Height:        Intake/Output Summary (Last 24 hours) at 06/16/2021 1525 Last data filed at 06/16/2021 1342 Gross per 24 hour  Intake 954 ml  Output 1692 ml  Net -738 ml   Filed Weights   06/14/21 0500 06/14/21 0941 06/14/21 1218  Weight: 63.7 kg 63.7 kg 64.7 kg    Exam:  General: 26 y.o. year-old male frail-appearing no acute distress.  He is alert oriented x3.   Cardiovascular: Regular rate and rhythm no rubs or gallops.   Respiratory: Clear to auscultation no wheezes or rales.   Abdomen: Soft nontender normal bowel sounds 1.   Musculoskeletal: No new extremity edema bilaterally. Skin: Severe skin sloughing Psychiatry: Mood is appropriate for condition and setting.   Data Reviewed: CBC: Recent Labs  Lab 06/12/21 0339 06/13/21 0404 06/14/21 0346 06/15/21 0402 06/16/21 0509  WBC 13.5* 9.9 9.3 8.2 7.0  HGB 8.6* 9.5* 9.2* 8.8* 8.1*  HCT 25.1* 28.3* 26.9* 26.2* 24.4*  MCV 85.7 87.6 87.3 87.3 88.7  PLT 38* 46* 50* 58* 63*   Basic Metabolic Panel: Recent Labs  Lab 06/11/21 0334 06/12/21 0339 06/13/21 0404 06/14/21 0346 06/15/21 0402 06/16/21 0543  NA 135 134* 133* 130* 136 134*  K 3.3* 3.8 3.6 4.3 4.3 4.9  CL 104 104 100 98 105 104  CO2 23 22 24 22 24 23   GLUCOSE 164* 151* 50* 267* 146* 207*  BUN 54* 69* 56* 68* 51* 60*  CREATININE 2.30* 2.71* 2.81* 3.54* 3.33* 4.31*  CALCIUM 6.8* 6.5* 6.6* 6.3* 6.5* 6.3*   MG 2.7* 2.8* 2.4 2.6*  --  2.3  PHOS 3.2 4.4 3.7 4.3  --  4.5   GFR: Estimated Creatinine Clearance: 23.6 mL/min (A) (by C-G formula based on SCr of 4.31 mg/dL (H)). Liver Function Tests: Recent Labs  Lab 06/11/21 0334 06/12/21 0339 06/13/21 0404 06/14/21 0346 06/16/21 0543  AST  --  16  --   --   --   ALT  --  6  --   --   --   ALKPHOS  --  158*  --   --   --   BILITOT  --  1.3*  --   --   --   PROT  --  5.0*  --   --   --   ALBUMIN 1.8* 1.7* 1.8* 1.7* 1.6*   No results for input(s): LIPASE, AMYLASE in the last 168 hours. No results for input(s): AMMONIA in the last 168 hours. Coagulation Profile: No results for input(s): INR, PROTIME in the last 168 hours. Cardiac Enzymes: No results for input(s): CKTOTAL, CKMB, CKMBINDEX, TROPONINI in the last 168 hours.  BNP (last 3 results) No results for input(s): PROBNP in the last 8760 hours. HbA1C: No results for input(s): HGBA1C in the last 72 hours. CBG: Recent Labs  Lab 06/15/21 1559 06/15/21 1652 06/15/21 2037 06/15/21 2346 06/16/21 0838  GLUCAP 126* 125* 143* 142* 141*   Lipid Profile: No results for input(s): CHOL, HDL, LDLCALC, TRIG, CHOLHDL, LDLDIRECT in the last 72 hours. Thyroid Function Tests: No results for input(s): TSH, T4TOTAL, FREET4, T3FREE, THYROIDAB in the last 72 hours. Anemia Panel: No results for input(s): VITAMINB12, FOLATE, FERRITIN, TIBC, IRON, RETICCTPCT in the last 72 hours. Urine analysis:    Component Value Date/Time   COLORURINE AMBER (A) 06/01/2021 1023   APPEARANCEUR TURBID (A) 06/01/2021 1023   LABSPEC 1.017 06/01/2021 1023   PHURINE 6.0 06/01/2021 1023   GLUCOSEU 50 (A) 06/01/2021 1023   HGBUR MODERATE (A) 06/01/2021 1023   BILIRUBINUR NEGATIVE 06/01/2021 1023   KETONESUR NEGATIVE 06/01/2021 1023   PROTEINUR >=300 (A) 06/01/2021 1023   UROBILINOGEN 0.2 05/10/2007 2155   NITRITE NEGATIVE 06/01/2021 1023   LEUKOCYTESUR LARGE (A) 06/01/2021 1023   Sepsis  Labs: @LABRCNTIP (procalcitonin:4,lacticidven:4)  )No results found for this or any previous visit (from the past 240 hour(s)).    Studies: No results found.  Scheduled Meds:  amiodarone  200 mg Oral Daily   vitamin C  500 mg Oral BID   calcium carbonate  1 tablet Oral BID WC   chlorhexidine gluconate (MEDLINE KIT)  15 mL Mouth Rinse BID   Chlorhexidine Gluconate Cloth  6 each Topical Daily   Chlorhexidine Gluconate Cloth  6 each Topical Q0600   collagenase   Topical Daily   feeding supplement (NEPRO CARB STEADY)  237 mL Oral BID BM   hydrocerin   Topical BID   insulin aspart  0-15 Units Subcutaneous TID WC   insulin glargine-yfgn  5 Units Subcutaneous QHS   mouth rinse  15 mL Mouth Rinse BID   multivitamin  1 tablet Oral QHS   pantoprazole  40 mg Oral Daily   sodium chloride flush  10-40 mL Intracatheter Q12H   thiamine  100 mg Oral Daily   zinc sulfate  220 mg Oral Daily    Continuous Infusions:  sodium chloride Stopped (06/12/21 2313)     LOS: 16 days     Kayleen Memos, MD [facility] Pager [phone]  If 7PM-7AM, please contact night-coverage [email or web address] Password [facility] 06/16/2021, 3:25 PM

## 2021-06-16 NOTE — Progress Notes (Signed)
°[facility]  °ROUNDING NOTE  ° °Subjective:  ° °Patient seen and evaluated during dialysis °  °HEMODIALYSIS FLOWSHEET: ° °Blood Flow Rate (mL/min): 300 mL/min °Arterial Pressure (mmHg): -110 mmHg °Venous Pressure (mmHg): 70 mmHg °Transmembrane Pressure (mmHg): 60 mmHg °Ultrafiltration Rate (mL/min): 500 mL/min °Dialysate Flow Rate (mL/min): 500 ml/min °Conductivity: Machine : 13.7 °Conductivity: Machine : 13.7 °Dialysis Fluid Bolus: Normal Saline °Bolus Amount (mL): 250 mL ° °Currently seated in chair °Tolerating treatment well °No complaints at this time ° °Objective:  °Vital signs in last 24 hours:  °Temp:  [97.6 °F (36.4 °C)] 97.6 °F (36.4 °C) (02/03 0740) °Pulse Rate:  [70-81] 78 (02/03 1215) °Resp:  [6-26] 10 (02/03 1215) °BP: (99-129)/(58-91) 123/85 (02/03 1215) °SpO2:  [99 %-100 %] 100 % (02/03 1215) ° °Weight change:  °Filed Weights  ° 06/14/21 0500 06/14/21 0941 06/14/21 1218  °Weight: 63.7 kg 63.7 kg 64.7 kg  ° ° °Intake/Output: °I/O last 3 completed shifts: °In: 714 [P.O.:714] °Out: 0  °  °Intake/Output this shift: ° Total I/O °In: 240 [P.O.:240] °Out: 400 [Stool:400] ° °Physical Exam: °General: No acute distress  °Eyes: Anicteric  °Lungs:  Diminished in bases normal effort  °Heart: S1S2, no rubs  °Abdomen:  Soft, nontender, BS present  °Extremities: Trace peripheral edema.  °Neurologic: Awake, alert, no rbus  °Skin: Peeling skin noted  °Access:  Right femoral temp cath, Rt permcath placed on [place] on 06/15/21  °Rectal tube, external catheter  ° °Basic Metabolic Panel: °Recent Labs  °Lab 06/11/21 °0334 06/12/21 °0339 06/13/21 °0404 06/14/21 °0346 06/15/21 °0402 06/16/21 °0543  °NA 135 134* 133* 130* 136 134*  °K 3.3* 3.8 3.6 4.3 4.3 4.9  °CL 104 104 100 98 105 104  °CO2 23 22 24 22 24 23  °GLUCOSE 164* 151* 50* 267* 146* 207*  °BUN 54* 69* 56* 68* 51* 60*  °CREATININE 2.30* 2.71* 2.81* 3.54* 3.33* 4.31*  °CALCIUM 6.8* 6.5* 6.6* 6.3* 6.5* 6.3*  °MG 2.7* 2.8* 2.4 2.6*  --  2.3  °PHOS 3.2 4.4  3.7 4.3  --  4.5  ° ° ° °Liver Function Tests: °Recent Labs  °Lab 06/11/21 °0334 06/12/21 °0339 06/13/21 °0404 06/14/21 °0346 06/16/21 °0543  °AST  --  16  --   --   --   °ALT  --  6  --   --   --   °ALKPHOS  --  158*  --   --   --   °BILITOT  --  1.3*  --   --   --   °PROT  --  5.0*  --   --   --   °ALBUMIN 1.8* 1.7* 1.8* 1.7* 1.6*  ° ° °No results for input(s): LIPASE, AMYLASE in the last 168 hours. ° °No results for input(s): AMMONIA in the last 168 hours. ° ° °CBC: °Recent Labs  °Lab 06/12/21 °0339 06/13/21 °0404 06/14/21 °0346 06/15/21 °0402 06/16/21 °0509  °WBC 13.5* 9.9 9.3 8.2 7.0  °HGB 8.6* 9.5* 9.2* 8.8* 8.1*  °HCT 25.1* 28.3* 26.9* 26.2* 24.4*  °MCV 85.7 87.6 87.3 87.3 88.7  °PLT 38* 46* 50* 58* 63*  ° ° ° °Cardiac Enzymes: °No results for input(s): CKTOTAL, CKMB, CKMBINDEX, TROPONINI in the last 168 hours. ° ° °BNP: °Invalid input(s): POCBNP ° °CBG: °Recent Labs  °Lab 06/15/21 °1559 06/15/21 °1652 06/15/21 °2037 06/15/21 °2346 06/16/21 °0838  °GLUCAP 126* 125* 143* 142* 141*  ° ° ° °Microbiology: °Results for orders placed or performed during the hospital encounter of   05/31/21  °Resp Panel by RT-PCR (Flu A&B, Covid) Nasopharyngeal Swab     Status: None  ° Collection Time: 05/31/21  6:58 AM  ° Specimen: Nasopharyngeal Swab; Nasopharyngeal(NP) swabs in vial transport medium  °Result Value Ref Range Status  ° SARS Coronavirus 2 by RT PCR NEGATIVE NEGATIVE Final  °  Comment: (NOTE) °SARS-CoV-2 target nucleic acids are NOT DETECTED. ° °The SARS-CoV-2 RNA is generally detectable in upper respiratory °specimens during the acute phase of infection. The lowest °concentration of SARS-CoV-2 viral copies this assay can detect is °138 copies/mL. A negative result does not preclude SARS-Cov-2 °infection and should not be used as the sole basis for treatment or °other patient management decisions. A negative result may occur with  °improper specimen collection/handling, submission of specimen other °than nasopharyngeal  swab, presence of viral mutation(s) within the °areas targeted by this assay, and inadequate number of viral °copies(<138 copies/mL). A negative result must be combined with °clinical observations, patient history, and epidemiological °information. The expected result is Negative. ° °Fact Sheet for Patients:  °https://www.fda.gov/media/152166/download ° °Fact Sheet for Healthcare Providers:  °https://www.fda.gov/media/152162/download ° °This test is no t yet approved or cleared by the [place] FDA and  °has been authorized for detection and/or diagnosis of SARS-CoV-2 by °FDA under an Emergency Use Authorization (EUA). This EUA will remain  °in effect (meaning this test can be used) for the duration of the °COVID-19 declaration under Section 564(b)(1) of the Act, 21 °U.S.C.section 360bbb-3(b)(1), unless the authorization is terminated  °or revoked sooner.  ° ° °  ° Influenza A by PCR NEGATIVE NEGATIVE Final  ° Influenza B by PCR NEGATIVE NEGATIVE Final  °  Comment: (NOTE) °The Xpert Xpress SARS-CoV-2/FLU/RSV plus assay is intended as an aid °in the diagnosis of influenza from Nasopharyngeal swab specimens and °should not be used as a sole basis for treatment. Nasal washings and °aspirates are unacceptable for Xpert Xpress SARS-CoV-2/FLU/RSV °testing. ° °Fact Sheet for Patients: °https://www.fda.gov/media/152166/download ° °Fact Sheet for Healthcare Providers: °https://www.fda.gov/media/152162/download ° °This test is not yet approved or cleared by the [place] FDA and °has been authorized for detection and/or diagnosis of SARS-CoV-2 by °FDA under an Emergency Use Authorization (EUA). This EUA will remain °in effect (meaning this test can be used) for the duration of the °COVID-19 declaration under Section 564(b)(1) of the Act, 21 U.S.C. °section 360bbb-3(b)(1), unless the authorization is terminated or °revoked. ° °Performed at [facility], [address] [place], °[place] [address] °   °Respiratory (~20 pathogens) panel by PCR     Status: None  ° Collection Time: 05/31/21  6:58 AM  ° Specimen: Nasopharyngeal Swab; Respiratory  °Result Value Ref Range Status  ° Adenovirus NOT DETECTED NOT DETECTED Final  ° Coronavirus 229E NOT DETECTED NOT DETECTED Final  °  Comment: (NOTE) °The Coronavirus on the Respiratory Panel, DOES NOT test for the novel  °Coronavirus (2019 nCoV) °  ° Coronavirus HKU1 NOT DETECTED NOT DETECTED Final  ° Coronavirus NL63 NOT DETECTED NOT DETECTED Final  ° Coronavirus OC43 NOT DETECTED NOT DETECTED Final  ° Metapneumovirus NOT DETECTED NOT DETECTED Final  ° Rhinovirus / Enterovirus NOT DETECTED NOT DETECTED Final  ° Influenza A NOT DETECTED NOT DETECTED Final  ° Influenza B NOT DETECTED NOT DETECTED Final  ° Parainfluenza Virus 1 NOT DETECTED NOT DETECTED Final  ° Parainfluenza Virus 2 NOT DETECTED NOT DETECTED Final  ° Parainfluenza Virus 3 NOT DETECTED NOT DETECTED Final  ° Parainfluenza Virus 4 NOT DETECTED NOT   NOT DETECTED Final   Respiratory Syncytial Virus NOT DETECTED NOT DETECTED Final   Bordetella pertussis NOT DETECTED NOT DETECTED Final   Bordetella Parapertussis NOT DETECTED NOT DETECTED Final   Chlamydophila pneumoniae NOT DETECTED NOT DETECTED Final   Mycoplasma pneumoniae NOT DETECTED NOT DETECTED Final    Comment: Performed at [facility], [place] [address] [place], [place] [address]  Group A Strep by PCR [facility] Only)     Status: None   Collection Time: 05/31/21  8:35 AM   Specimen: Throat; Sterile Swab  Result Value Ref Range Status   Group A Strep by PCR NOT DETECTED NOT DETECTED Final    Comment: Performed at [facility], [address] [place], [place] [address]  Blood culture (routine x 2)     Status: None   Collection Time: 05/31/21  8:39 AM   Specimen: BLOOD  Result Value Ref Range Status   Specimen Description BLOOD BLOOD RIGHT FOREARM  Final   Special Requests   Final    BOTTLES DRAWN AEROBIC AND ANAEROBIC Blood Culture  results may not be optimal due to an excessive volume of blood received in culture bottles   Culture   Final    NO GROWTH 5 DAYS Performed at [facility], [address] [place], [place] [address]    Report Status 06/05/2021 FINAL  Final  Blood culture (routine x 2)     Status: None   Collection Time: 05/31/21  8:39 AM   Specimen: BLOOD  Result Value Ref Range Status   Specimen Description BLOOD RIGHT ANTECUBITAL  Final   Special Requests   Final    BOTTLES DRAWN AEROBIC AND ANAEROBIC Blood Culture results may not be optimal due to an excessive volume of blood received in culture bottles   Culture   Final    NO GROWTH 5 DAYS Performed at [facility], [address], [place], [place] [address]    Report Status 06/05/2021 FINAL  Final  C Difficile Quick Screen w PCR reflex     Status: None   Collection Time: 05/31/21  1:20 PM   Specimen: Stool  Result Value Ref Range Status   C Diff antigen NEGATIVE NEGATIVE Final   C Diff toxin NEGATIVE NEGATIVE Final   C Diff interpretation No C. difficile detected.  Final    Comment: Performed at [facility], [address] [place], [place] [address]  Gastrointestinal Panel by PCR , Stool     Status: None   Collection Time: 05/31/21  1:30 PM   Specimen: Stool  Result Value Ref Range Status   Campylobacter species NOT DETECTED NOT DETECTED Final   Plesimonas shigelloides NOT DETECTED NOT DETECTED Final   Salmonella species NOT DETECTED NOT DETECTED Final   Yersinia enterocolitica NOT DETECTED NOT DETECTED Final   Vibrio species NOT DETECTED NOT DETECTED Final   Vibrio cholerae NOT DETECTED NOT DETECTED Final   Enteroaggregative E coli (EAEC) NOT DETECTED NOT DETECTED Final   Enteropathogenic E coli (EPEC) NOT DETECTED NOT DETECTED Final   Enterotoxigenic E coli (ETEC) NOT DETECTED NOT DETECTED Final   Shiga like toxin producing E coli (STEC) NOT DETECTED NOT DETECTED Final   Shigella/Enteroinvasive E coli  (EIEC) NOT DETECTED NOT DETECTED Final   Cryptosporidium NOT DETECTED NOT DETECTED Final   Cyclospora cayetanensis NOT DETECTED NOT DETECTED Final   Entamoeba histolytica NOT DETECTED NOT DETECTED Final   Giardia lamblia NOT DETECTED NOT DETECTED Final   Adenovirus F40/41  DETECTED NOT DETECTED Final  ° Astrovirus NOT DETECTED NOT DETECTED Final  ° Norovirus GI/GII NOT DETECTED NOT DETECTED Final  ° Rotavirus A NOT DETECTED NOT DETECTED Final  ° Sapovirus (I, II, IV, and V) NOT DETECTED NOT DETECTED Final  °  Comment: Performed at [facility], [address] [place], [place] [address]  °Urine Culture     Status: None  ° Collection Time: 06/01/21 10:23 AM  ° Specimen: In/Out Cath Urine  °Result Value Ref Range Status  ° Specimen Description   Final  °  IN/OUT CATH URINE °Performed at [facility], [address] [place], [place] [address] °  ° Special Requests   Final  °  NONE °Performed at [facility], [address] [place], [place] [address] °  ° Culture   Final  °  NO GROWTH °Performed at [facility], [address] [place], [place] [address] °  ° Report Status 06/03/2021 FINAL  Final  °Culture, Respiratory w Gram Stain     Status: None  ° Collection Time: 06/01/21  3:59 PM  ° Specimen: Tracheal Aspirate; Respiratory  °Result Value Ref Range Status  ° Specimen Description   Final  °  TRACHEAL ASPIRATE °Performed at [facility], [address] [place], [place] [address] °  ° Special Requests   Final  °  NONE °Performed at [facility], [address] [place], [place] [address] °  ° Gram Stain   Final  °  RARE SQUAMOUS EPITHELIAL CELLS PRESENT °MODERATE WBC PRESENT,BOTH PMN AND MONONUCLEAR °MODERATE GRAM POSITIVE COCCI °FEW GRAM NEGATIVE RODS °FEW GRAM POSITIVE RODS °RARE YEAST °  ° Culture   Final  °  FEW Consistent with normal respiratory flora. °No Pseudomonas species isolated °Performed at [facility], [address]  , [place] [address] °  ° Report Status 06/03/2021 FINAL  Final  °CULTURE, BLOOD (ROUTINE X 2) w Reflex to ID Panel     Status: None  ° Collection Time: 06/01/21  7:06 PM  ° Specimen: BLOOD  °Result Value Ref Range Status  ° Specimen Description BLOOD RIGHT ANTECUBITAL  Final  ° Special Requests   Final  °  BOTTLES DRAWN AEROBIC ONLY Blood Culture results may not be optimal due to an inadequate volume of blood received in culture bottles  ° Culture   Final  °  NO GROWTH 5 DAYS °Performed at [facility], [address] [place], [place] [address] °  ° Report Status 06/06/2021 FINAL  Final  °CULTURE, BLOOD (ROUTINE X 2) w Reflex to ID Panel     Status: None  ° Collection Time: 06/01/21  7:37 PM  ° Specimen: BLOOD  °Result Value Ref Range Status  ° Specimen Description BLOOD [facility]  Final  ° Special Requests BOTTLES DRAWN AEROBIC AND ANAEROBIC BCLV  Final  ° Culture   Final  °  NO GROWTH 5 DAYS °Performed at [facility], [address] [place], [place] [address] °  ° Report Status 06/06/2021 FINAL  Final  °MRSA Next Gen by PCR, Nasal     Status: None  ° Collection Time: 06/02/21 10:46 AM  ° Specimen: Nasal Mucosa; Nasal Swab  °Result Value Ref Range Status  ° MRSA by PCR Next Gen NOT DETECTED NOT DETECTED Final  °  Comment: (NOTE) °The GeneXpert MRSA Assay (FDA approved for NASAL specimens only), °is one component of a comprehensive MRSA colonization surveillance °program. It is   not intended to diagnose MRSA infection nor to guide °or monitor treatment for MRSA infections. °Test performance is not FDA approved in patients less than 2 years °old. °Performed at [facility], [address] [place], °[place] [address] °  °Culture, Respiratory w Gram Stain     Status: None  ° Collection Time: 06/04/21  7:38 AM  ° Specimen: Tracheal Aspirate; Respiratory  °Result Value Ref Range Status  ° Specimen Description   Final  °  TRACHEAL ASPIRATE °Performed at [facility], [address] [place], [place] [address] °  ° Special Requests   Final  °  NONE °Performed at [facility], [address] [place], [place] [address] °  ° Gram Stain   Final  °  MODERATE WBC PRESENT,BOTH PMN AND MONONUCLEAR °NO ORGANISMS SEEN °Performed at [facility], [address] [place], [place] [address] °  ° Culture FEW CANDIDA ALBICANS °RARE ESCHERICHIA COLI °  Final  ° Report Status 06/07/2021 FINAL  Final  ° Organism ID, Bacteria ESCHERICHIA COLI  Final  °    Susceptibility  ° Escherichia coli - MIC*  °  AMPICILLIN >=32 RESISTANT Resistant   °  CEFAZOLIN <=4 SENSITIVE Sensitive   °  CEFEPIME <=0.12 SENSITIVE Sensitive   °  CEFTAZIDIME <=1 SENSITIVE Sensitive   °  CEFTRIAXONE <=0.25 SENSITIVE Sensitive   °  CIPROFLOXACIN <=0.25 SENSITIVE Sensitive   °  GENTAMICIN <=1 SENSITIVE Sensitive   °  IMIPENEM <=0.25 SENSITIVE Sensitive   °  TRIMETH/SULFA >=320 RESISTANT Resistant   °  AMPICILLIN/SULBACTAM >=32 RESISTANT Resistant   °  PIP/TAZO <=4 SENSITIVE Sensitive   °  * RARE ESCHERICHIA COLI  °CULTURE, BLOOD (ROUTINE X 2) w Reflex to ID Panel     Status: None  ° Collection Time: 06/04/21  6:35 PM  ° Specimen: BLOOD  °Result Value Ref Range Status  ° Specimen Description BLOOD BLOOD LEFT HAND  Final  ° Special Requests   Final  °  BOTTLES DRAWN AEROBIC AND ANAEROBIC Blood Culture adequate volume  ° Culture   Final  °  NO GROWTH 5 DAYS °Performed at [facility], [address] [place],  [address] °  ° Report Status 06/09/2021 FINAL  Final  ° ° °Coagulation Studies: °No results for input(s): LABPROT, INR in the last 72 hours. ° ° °Urinalysis: °No results for input(s): COLORURINE, LABSPEC, PHURINE, GLUCOSEU, HGBUR, BILIRUBINUR, KETONESUR, PROTEINUR, UROBILINOGEN, NITRITE, LEUKOCYTESUR in the last 72 hours. ° °Invalid input(s): APPERANCEUR °  ° ° °Imaging: °PERIPHERAL VASCULAR CATHETERIZATION ° °Result Date: 06/15/2021 °See surgical note for result.  ° ° °Medications:  ° ° sodium chloride Stopped  (06/12/21 2313)  ° ° amiodarone  200 mg Oral Daily  ° vitamin C  500 mg Oral BID  ° chlorhexidine gluconate (MEDLINE KIT)  15 mL Mouth Rinse BID  ° Chlorhexidine Gluconate Cloth  6 each Topical Daily  ° Chlorhexidine Gluconate Cloth  6 each Topical Q0600  ° collagenase   Topical Daily  ° feeding supplement (NEPRO CARB STEADY)  237 mL Oral BID BM  ° hydrocerin   Topical BID  ° insulin aspart  0-15 Units Subcutaneous TID WC  ° insulin glargine-yfgn  5 Units Subcutaneous QHS  ° mouth rinse  15 mL Mouth Rinse BID  ° multivitamin  1 tablet Oral QHS  ° pantoprazole  40 mg Oral Daily  ° sodium chloride flush  10-40 mL Intracatheter Q12H  ° thiamine  100   100 mg Oral Daily   zinc sulfate  220 mg Oral Daily   sodium chloride, docusate, fentaNYL (SUBLIMAZE) injection, guaiFENesin-dextromethorphan, ipratropium-albuterol, lip balm, ondansetron **OR** ondansetron (ZOFRAN) IV, oxyCODONE, polyvinyl alcohol, sodium chloride flush  Assessment/ Plan:  [name] is a 26 y.o. black male with hypertension, insulin dependent diabetes mellitus type I, diabetic gastroparesis, diabetic neuropathy, tobacco use, THC use, right toe amputation, perirectal abscess, who is admitted to [facility] on 05/31/2021 for SIRS (systemic inflammatory response syndrome) ([place]) [R65.10] AKI (acute kidney injury) ([place]) [N17.9] Symptomatic anemia [D64.9] Acute renal failure superimposed on stage 5 chronic kidney disease, not on chronic dialysis, unspecified acute renal failure type ([place]) [N17.9, N18.5] Hypertension, unspecified type [I10] Acute cough [R05.1]  Cardiac arrest with code blue 06/01/2021 at 10 am. Transferred to ICU. Intubated  End-stage renal disease requiring hemodialysis. Based on patient's history of nephrotic range proteinuria and hematuria and lack of sustainable renal recovery, we feel patient has progressed to end-stage renal disease. Strong family history of dialysis with mother with ESRD before passing. Was on CRRT.   -Appreciate vascular surgeon placing a right PermCath yesterday.  Attempted to use permacath for treatment today.  Able to decrease blood flow, arterial pressures remain very high.  Contacted vascular for further evaluation.  Right femoral line tip cath remains in place, will use this for remainder of treatment. - Dialysis coordinator aware of patient and outpatient  placement currently in progress    Acute respiratory failure Extubated then re-intubated 06/03/2021; extubated 06/08/2021 -Remains on room air  Hypotension with cardiogenic shock -Blood pressure 123/85 during dialysis -Prescribed amiodarone  4. Insulin dependent Diabetes type 1 with CKD and proteinuria   Lab Results  Component Value Date   HGBA1C 5.7 (H) 05/31/2021  Primary team to manage SSI  5. Thrombocytopenia Hematology evaluation on 1/22 Suspected peripheral destruction Platelets continue to increase, 68,000     LOS: [place] 2/3/202312:20 PM

## 2021-06-16 NOTE — TOC Progression Note (Signed)
Transition of Care [facility]) - Progression Note    Patient Details  Name: Jonathan Mcmillan MRN: [id number] Date of Birth: 1996/03/18  Transition [facility]) CM/SW [place], RN Phone Number: 06/16/2021, 3:42 PM  Clinical Narrative:  Discussed CIR with Urban Gibson, who informed me that more PT sessions is needed before CIR evaluation can occur. CIR will continue to track and evaluate then notify TOC for follow up needs.     Expected Discharge Plan: IP Rehab Facility Barriers to Discharge: Continued Medical Work up  Expected Discharge Plan and Services Expected Discharge Plan: [place]   Discharge Planning Services: CM Consult   Living arrangements for the past 2 months: Single Family Home                                       Social Determinants of Health (SDOH) Interventions    Readmission Risk Interventions Readmission Risk Prevention Plan 06/13/2020  Transportation Screening Complete  Palliative Care Screening Not Applicable  Medication Review (RN Care Manager) Complete  Some recent data might be hidden

## 2021-06-16 NOTE — Progress Notes (Signed)
Right subclavian catheter functioning poorly, unable to maintain prescribed blood flow rate, arterial and venous pressures elevated despite flushing, line reversal, and repositioning of patient. After discussion with Nephrology NP the decision to use femoral catheter was made, which has similar issues, but parameters are within range at this time.

## 2021-06-16 NOTE — Progress Notes (Signed)
Physical Therapy Treatment Patient Details Name: Jonathan Mcmillan MRN: [id number] DOB: 1995-08-09 Today's Date: 06/16/2021   History of Present Illness Pt is a 26 y/o M admitted on 06/01/21 with AKI on CKD in setting of viral syndrome & diarrhea. Pt suffered in-hospital cardiac arrest, suspect due to severe metabolic acidosis & multiple metabolic derangements. Required CRRT and now preparing to undergo regular HD with R temp femoral catheter placed on 1/28. Pt was extubated on 1/21 but reintubated 1/22. Extubated again on 1/26 & now weaned to room air. PMH: DM2, CKD stage IV, HTN, nicotine dependence.    PT Comments    Pt was pleasant and motivated to participate during the session and put forth good effort throughout. Pt required physical assistance with bed mobility tasks and SPT from bed to chair along with multi-modal cues for proper sequencing.  Nursing present for education on SPT technique and positioning education in chair per nephrology guidelines with R temp fem cath.  Pt will benefit from PT services in a SNF setting upon discharge to safely address deficits listed in patient problem list for decreased caregiver assistance and eventual return to PLOF.     Recommendations for follow up therapy are one component of a multi-disciplinary discharge planning process, led by the attending physician.  Recommendations may be updated based on patient status, additional functional criteria and insurance authorization.  Follow Up Recommendations  Acute inpatient rehab (3hours/day)     Assistance Recommended at Discharge Frequent or constant Supervision/Assistance  Patient can return home with the following Two people to help with walking and/or transfers;Two people to help with bathing/dressing/bathroom;Direct supervision/assist for medications management;Help with stairs or ramp for entrance;Assistance with feeding;Assist for transportation;Assistance with cooking/housework;Direct supervision/assist  for financial management   Equipment Recommendations  None recommended by PT    Recommendations for Other Services       Precautions / Restrictions Precautions Precautions: Fall Precaution Comments: R temporary femoral dialysis catheter: per Dr. Holley Raring, nephrology, ok for SPTs bed to/from chair, no walking, do not elevate HOB or chair past 60 deg Restrictions Weight Bearing Restrictions: No     Mobility  Bed Mobility   Bed Mobility: Supine to Sit     Supine to sit: Min assist     General bed mobility comments: assist for BLE and trunk control    Transfers Overall transfer level: Needs assistance       Stand pivot transfers: Mod assist, From elevated surface         General transfer comment: Mod A and mod verbal and visual cues for sequencing from elevated EOB    Ambulation/Gait               General Gait Details: N/A, contraindicated   Stairs             Wheelchair Mobility    Modified Rankin (Stroke Patients Only)       Balance Overall balance assessment: Needs assistance Sitting-balance support: No upper extremity supported, Feet supported Sitting balance-Leahy Scale: Fair                                      Cognition Arousal/Alertness: Awake/alert Behavior During Therapy: Flat affect Overall Cognitive Status: Within Functional Limits for tasks assessed  Exercises Total Joint Exercises Ankle Circles/Pumps: AROM, Strengthening, Both, 5 reps, 10 reps Quad Sets: Strengthening, Both, 10 reps Gluteal Sets: Strengthening, Both, 10 reps Short Arc Quad: AROM, Strengthening, Both, 10 reps Long Arc Quad: AROM, Strengthening, Both, 10 reps Other Exercises Other Exercises: HEP review for BLE APs, QS, and GS x 10 each every 1-2 hours daily    General Comments        Pertinent Vitals/Pain Pain Assessment Pain Assessment: No/denies pain    Home Living                           Prior Function            PT Goals (current goals can now be found in the care plan section) Progress towards PT goals: Progressing toward goals    Frequency    Min 2X/week      PT Plan Current plan remains appropriate    Co-evaluation              AM-PAC PT "6 Clicks" Mobility   Outcome Measure  Help needed turning from your back to your side while in a flat bed without using bedrails?: A Lot Help needed moving from lying on your back to sitting on the side of a flat bed without using bedrails?: A Lot Help needed moving to and from a bed to a chair (including a wheelchair)?: A Lot Help needed standing up from a chair using your arms (e.g., wheelchair or bedside chair)?: A Lot Help needed to walk in hospital room?: Total Help needed climbing 3-5 steps with a railing? : Total 6 Click Score: 10    End of Session Equipment Utilized During Treatment: Gait belt Activity Tolerance: Patient tolerated treatment well Patient left: in chair;with call bell/phone within reach;with chair alarm set;Other (comment) (reclined to grossly 45 deg) Nurse Communication: Mobility status;Precautions PT Visit Diagnosis: Difficulty in walking, not elsewhere classified (R26.2);Muscle weakness (generalized) (M62.81)     Time: 7915-0569 PT Time Calculation (min) (ACUTE ONLY): 26 min  Charges:  $Therapeutic Exercise: 8-22 mins $Therapeutic Activity: 8-22 mins                     D. Scott Eiman Maret PT, DPT 06/16/21, 3:59 PM

## 2021-06-17 DIAGNOSIS — E108 Type 1 diabetes mellitus with unspecified complications: Secondary | ICD-10-CM

## 2021-06-17 LAB — GLUCOSE, CAPILLARY
Glucose-Capillary: 151 mg/dL — ABNORMAL HIGH (ref 70–99)
Glucose-Capillary: 213 mg/dL — ABNORMAL HIGH (ref 70–99)
Glucose-Capillary: 125 mg/dL — ABNORMAL HIGH (ref 70–99)

## 2021-06-17 LAB — CBC
HCT: 25.2 % — ABNORMAL LOW (ref 39.0–52.0)
Hemoglobin: 8.1 g/dL — ABNORMAL LOW (ref 13.0–17.0)
MCH: 29.5 pg (ref 26.0–34.0)
MCHC: 32.1 g/dL (ref 30.0–36.0)
MCV: 91.6 fL (ref 80.0–100.0)
Platelets: 58 10*3/uL — ABNORMAL LOW (ref 150–400)
RBC: 2.75 MIL/uL — ABNORMAL LOW (ref 4.22–5.81)
RDW: 15.4 % (ref 11.5–15.5)
WBC: 6.3 10*3/uL (ref 4.0–10.5)
nRBC: 0 % (ref 0.0–0.2)

## 2021-06-17 LAB — RENAL FUNCTION PANEL
Albumin: 1.7 g/dL — ABNORMAL LOW (ref 3.5–5.0)
Anion gap: 7 (ref 5–15)
BUN: 38 mg/dL — ABNORMAL HIGH (ref 6–20)
CO2: 24 mmol/L (ref 22–32)
Calcium: 6.6 mg/dL — ABNORMAL LOW (ref 8.9–10.3)
Chloride: 104 mmol/L (ref 98–111)
Creatinine, Ser: 3.43 mg/dL — ABNORMAL HIGH (ref 0.61–1.24)
GFR, Estimated: 24 mL/min — ABNORMAL LOW (ref >60)
Glucose, Bld: 172 mg/dL — ABNORMAL HIGH (ref 70–99)
Phosphorus: 3.8 mg/dL (ref 2.5–4.6)
Potassium: 4.4 mmol/L (ref 3.5–5.1)
Sodium: 135 mmol/L (ref 135–145)

## 2021-06-17 LAB — CALCIUM, IONIZED: Calcium, Ionized, Serum: 4 mg/dL — ABNORMAL LOW (ref 4.5–5.6)

## 2021-06-17 MED ORDER — POLYVINYL ALCOHOL 1.4 % OP SOLN
2.0000 [drp] | OPHTHALMIC | Status: DC | PRN
  Administered 2021-06-17 – 2021-06-21 (×6): 2 [drp] via OPHTHALMIC
  Filled 2021-06-17 (×2): qty 15

## 2021-06-17 MED ORDER — CALCITRIOL 0.5 MCG PO CAPS
0.5000 ug | ORAL_CAPSULE | Freq: Every day | ORAL | Status: DC
  Administered 2021-06-17 – 2021-06-22 (×5): 0.5 ug via ORAL
  Filled 2021-06-17: qty 1
  Filled 2021-06-17 (×2): qty 2
  Filled 2021-06-17: qty 1
  Filled 2021-06-17 (×2): qty 2

## 2021-06-17 NOTE — Progress Notes (Signed)
°  Progress Note   Patient: Jonathan Mcmillan SHU:[id number] DOB: 05-04-1996 DOA: 05/31/2021     17 DOS: the patient was seen and examined on 06/17/2021   Brief hospital course: 26 year old man PMH including diabetes and CKD stage IV presented with fever, admitted for AKI, suffered PEA arrest, successfully resuscitated, treated for sepsis.  Renal failure worsened requiring CRRT.  Extubated but, required reintubation for large volume aspiration, shock, multiorgan failure and severe thrombocytopenia.  Assessment and Plan: * Septic shock ([place]) --Resolved.  Secondary to E. coli.  Antibiotics complete.    Hypocalcemia - Remains low, replete.  Chronic combined systolic and diastolic CHF (congestive heart failure) ([place]) - Management per dialysis.  Paroxysmal atrial fibrillation (HCC) --Not on anticoagulation secondary to marked thrombocytopenia. -Continue amiodarone  Anemia in chronic kidney disease, on chronic dialysis (HCC) --hemoglobin remains stable  Thrombocytopenia (HCC) --Platelets stable.  Acute respiratory failure with hypoxia (HCC) --Secondary to septic shock, followed by aspiration, resolved.  Type 1 diabetes mellitus with complication (HCC) --CBG stable.  Continue long-acting insulin and sliding scale.  Acute kidney injury superimposed on CKD stage IV [facility]) --Required CRRT.  Renal function has not recovered.  Now with ESRD.  HD per nephrology.     Subjective: Feels ok  Physical Exam: Vitals:   06/16/21 1956 06/17/21 0500 06/17/21 0526 06/17/21 0813  BP: 126/84  133/89 134/89  Pulse: 80  81 83  Resp: 16  16 18   Temp: 97.6 F (36.4 C)  98.2 F (36.8 C) 97.7 F (36.5 C)  TempSrc:    Oral  SpO2: 100%  100% 100%  Weight:  66.9 kg    Height:       Physical Exam Vitals reviewed.  Constitutional:      General: He is not in acute distress.    Appearance: He is not ill-appearing or toxic-appearing.  Cardiovascular:     Rate and Rhythm: Normal rate and regular  rhythm.     Heart sounds: No murmur heard. Pulmonary:     Effort: Pulmonary effort is normal. No respiratory distress.     Breath sounds: No wheezing, rhonchi or rales.  Neurological:     Mental Status: He is alert.  Psychiatric:        Mood and Affect: Mood normal.        Behavior: Behavior normal.     Data Reviewed: Creatinine 3.43, Hgb stable 8.1  Family Communication: none  Disposition: Status is: Inpatient  Remains inpatient appropriate because: new ESRD    Planned Discharge Destination: Home     Time spent: 20 minutes  Author: Murray Hodgkins, MD 06/17/2021 2:27 PM  For on call review [email or web address].

## 2021-06-17 NOTE — Progress Notes (Signed)
[facility]  PROGRESS NOTE   Subjective:   Events noted.  Patient had permacath placement.  Had stable dialysis yesterday. Denies any chest pain, shortness of breath.  Objective:  Vital signs in last 24 hours:  Temp:  [97.4 F (36.3 C)-98.2 F (36.8 C)] 97.7 F (36.5 C) (02/04 0813) Pulse Rate:  [79-83] 83 (02/04 0813) Resp:  [12-18] 18 (02/04 0813) BP: (122-157)/(83-107) 134/89 (02/04 0813) SpO2:  [100 %] 100 % (02/04 0813) Weight:  [66.9 kg] 66.9 kg (02/04 0500)  Weight change:  Filed Weights   06/14/21 0941 06/14/21 1218 06/17/21 0500  Weight: 63.7 kg 64.7 kg 66.9 kg    Intake/Output: I/O last 3 completed shifts: In: 1073.6 [P.O.:954; IV Piggyback:119.6] Out: 1692 [Other:992; Stool:700]   Intake/Output this shift:  No intake/output data recorded.  Physical Exam: General:  No acute distress  Head:  Normocephalic, atraumatic. Moist oral mucosal membranes  Eyes:  Anicteric  Neck:  Supple  Lungs:   Clear to auscultation, normal effort  Heart:  S1S2 no rubs  Abdomen:   Soft, nontender, bowel sounds present  Extremities:  peripheral edema.  Neurologic:  Awake, alert, following commands  Skin:  No lesions  Access:     Basic Metabolic Panel: Recent Labs  Lab 06/11/21 0334 06/12/21 0339 06/13/21 0404 06/14/21 0346 06/15/21 0402 06/16/21 0543 06/17/21 0613  NA 135 134* 133* 130* 136 134* 135  K 3.3* 3.8 3.6 4.3 4.3 4.9 4.4  CL 104 104 100 98 105 104 104  CO2 _0 GLUCOSE 164* 151* 50* 267* 146* 207* 172*  BUN 54* 69* 56* 68* 51* 60* 38*  CREATININE 2.30* 2.71* 2.81* 3.54* 3.33* 4.31* 3.43*  CALCIUM 6.8* 6.5* 6.6* 6.3* 6.5* 6.3* 6.6*  MG 2.7* 2.8* 2.4 2.6*  --  2.3  --   PHOS 3.2 4.4 3.7 4.3  --  4.5 3.8    CBC: Recent Labs  Lab 06/13/21 0404 06/14/21 0346 06/15/21 0402 06/16/21 0509 06/17/21 0613  WBC 9.9 9.3 8.2 7.0 6.3  HGB 9.5* 9.2* 8.8* 8.1* 8.1*  HCT 28.3* 26.9* 26.2* 24.4* 25.2*  MCV 87.6 87.3 87.3 88.7  91.6  PLT 46* 50* 58* 63* 58*     Urinalysis: No results for input(s): COLORURINE, LABSPEC, PHURINE, GLUCOSEU, HGBUR, BILIRUBINUR, KETONESUR, PROTEINUR, UROBILINOGEN, NITRITE, LEUKOCYTESUR in the last 72 hours.  Invalid input(s): APPERANCEUR    Imaging: PERIPHERAL VASCULAR CATHETERIZATION  Result Date: 06/15/2021 See surgical note for result.    Medications:    sodium chloride Stopped (06/12/21 2313)    amiodarone  200 mg Oral Daily   vitamin C  500 mg Oral BID   calcitRIOL  0.5 mcg Oral Daily   chlorhexidine gluconate (MEDLINE KIT)  15 mL Mouth Rinse BID   Chlorhexidine Gluconate Cloth  6 each Topical Daily   Chlorhexidine Gluconate Cloth  6 each Topical Q0600   collagenase   Topical Daily   feeding supplement (NEPRO CARB STEADY)  237 mL Oral BID BM   ferrous sulfate  325 mg Oral Q breakfast   hydrocerin   Topical BID   insulin aspart  0-15 Units Subcutaneous TID WC   insulin glargine-yfgn  5 Units Subcutaneous QHS   mouth rinse  15 mL Mouth Rinse BID   multivitamin  1 tablet Oral QHS   pantoprazole  40 mg Oral Daily   sodium chloride flush  10-40 mL Intracatheter Q12H   thiamine  100 mg Oral Daily  zinc sulfate  220 mg Oral Daily    Assessment/ Plan:     Principal Problem:   Septic shock ([place]) Active Problems:   Hypertension   Acute kidney injury superimposed on CKD (HCC)   Type 1 diabetes mellitus with complication (HCC)   Nicotine dependence   Symptomatic anemia   Cardiopulmonary arrest (HCC)   Acute respiratory failure with hypoxia (HCC)   Thrombocytopenia (HCC)   Anemia in chronic kidney disease, on chronic dialysis (HCC)   Paroxysmal atrial fibrillation (HCC)   Chronic combined systolic and diastolic CHF (congestive heart failure) (HCC)   Elevated troponin   Hypocalcemia   Pressure injury of buttock, stage 47 ([place])  26 year old male with history of diabetes, peripheral vascular disease, hypertension, coronary artery disease, history of substance  abuse, chronic kidney disease now with renal failure leading to dialysis dependence.  Was initially on CRRT but presently on intermittent hemodialysis.  #1: ESRD: Patient is now end-stage renal disease we will continue dialysis via permacath.  #2: Sepsis: Now improved and is off of antibiotics.  #3: Anemia: Possibly secondary to chronic kidney disease.  Will follow anemia protocol.  #4: Hypertension/congestive heart failure: Advised on importance of 1000 cc fluid restriction.  We will continue to attempt to remove fluid at dialysis.  #5: Bone mineral metabolism: Hypocalcemia secondary hyperparathyroidism of CKD.  Hypocalcemia possibly secondary to chronic kidney disease.  We will start supplementing calcitriol.  We will continue to follow closely.     LOS: [place], [facility] 2/4/20231:26 PM

## 2021-06-17 NOTE — Progress Notes (Signed)
°   06/17/21 1300  Clinical Encounter Type  Visited With Patient and family together  Visit Type Follow-up;Social support   Chaplain met Aunt of patient in hall and was invited to visit. Chaplain stopped in for brief visit of support.

## 2021-06-17 NOTE — Progress Notes (Addendum)
Patient refused to take his scheduled morning medications, he only wanted pain medicine. Patient also refused all care. PRN Oxycodone was administered as ordered.

## 2021-06-17 NOTE — Plan of Care (Signed)

## 2021-06-18 DIAGNOSIS — J690 Pneumonitis due to inhalation of food and vomit: Secondary | ICD-10-CM

## 2021-06-18 DIAGNOSIS — T829XXA Unspecified complication of cardiac and vascular prosthetic device, implant and graft, initial encounter: Secondary | ICD-10-CM

## 2021-06-18 LAB — GLUCOSE, CAPILLARY
Glucose-Capillary: 171 mg/dL — ABNORMAL HIGH (ref 70–99)
Glucose-Capillary: 258 mg/dL — ABNORMAL HIGH (ref 70–99)
Glucose-Capillary: 154 mg/dL — ABNORMAL HIGH (ref 70–99)
Glucose-Capillary: 172 mg/dL — ABNORMAL HIGH (ref 70–99)

## 2021-06-18 MED ORDER — CARVEDILOL 6.25 MG PO TABS
3.1250 mg | ORAL_TABLET | Freq: Two times a day (BID) | ORAL | Status: DC
  Administered 2021-06-19 – 2021-06-22 (×5): 3.125 mg via ORAL
  Filled 2021-06-18 (×6): qty 1

## 2021-06-18 NOTE — Assessment & Plan Note (Signed)
--   Hemoglobin stable status post blood products.  Secondary to ESRD.

## 2021-06-18 NOTE — TOC Progression Note (Signed)
Transition of Care [facility]) - Progression Note    Patient Details  Name: Jonathan Mcmillan MRN: [id number] Date of Birth: 1995/08/12  [facility]) CM/SW Contact  Izola Price, RN Phone Number: 06/18/2021, 12:16 PM  Clinical Narrative:  2/5: Plan for new HD catheter/port attempt Monday 06/19/21. CIR inpatient coordinator spoke with patient's aunt, Lynelle Smoke, about CIR and post discharge support and to follow up for potential admission/medically ready per note. Simmie Davies RN CM     Expected Discharge Plan: IP Rehab Facility Barriers to Discharge: Continued Medical Work up  Expected Discharge Plan and Services Expected Discharge Plan: [place]   Discharge Planning Services: CM Consult   Living arrangements for the past 2 months: Single Family Home                                       Social Determinants of Health (SDOH) Interventions    Readmission Risk Interventions Readmission Risk Prevention Plan 06/13/2020  Transportation Screening Complete  Palliative Care Screening Not Applicable  Medication Review (RN Care Manager) Complete  Some recent data might be hidden

## 2021-06-18 NOTE — Progress Notes (Signed)
Inpatient Rehab Admissions Coordinator:   I spoke with Pt.'s aunt Tammy regarding potential CIR admission. She is interested and states she and other family members can provide 24/7 support. I will follow and pursue for potential admission pending medical readiness.   Clemens Catholic, [place], [place] Admissions Coordinator  [phone] ([place]) [phone] (office)

## 2021-06-18 NOTE — Assessment & Plan Note (Signed)
--   Progressed to ESRD

## 2021-06-18 NOTE — Assessment & Plan Note (Signed)
--   PEA arrest probably from severe metabolic abnormalities.

## 2021-06-18 NOTE — Progress Notes (Signed)
Progress Note   Patient: Jonathan Mcmillan PXT:[id number] DOB: 05-03-1996 DOA: 05/31/2021     18 DOS: the patient was seen and examined on 06/18/2021   Brief hospital course: 26 year old man presenting with acute renal failure.  Seen by nephrology.  Suffered in-hospital cardiac PEA arrest secondary to multiple metabolic abnormalities, was successfully resuscitated, required CRRT and vasopressor support for shock.  Suffered large-volume aspiration, multiorgan failure, severe thrombocytopenia.  Required transfusion PRBCs and platelets.  Eventually improved and was successfully extubated and transferred to hospitalist service 1/31.    Procedures and Significant Results:  ACLS 06/01/2021 06/01/2021 intubation, placement central line, A-line, try Alysis catheter, CRRT initiated 06/03/2021 extubated, reintubated 06/04/2021 due to large volume aspiration, worsening shock. 2/2 Placement of a 19 cm tip to cuff tunneled hemodialysis catheter via the right internal jugular vein   Consultants:  PCCM Nephrology - AKI Cardiology  -cardiac arrest hematology -thrombocytopenia Neurology -anoxic brain injury status postcardiac arrest Vascular surgeon -hemodialysis catheter  Assessment and Plan: * Acute kidney injury superimposed on CKD (HCC)-resolved as of 06/18/2021 --Baseline Cr 1.8-2.2 due to diabetes.  Renal function worsened progressing to need for CRRT.  Did not recover, diagnosed with ESRD and started on hemodialysis. --Continue management per nephrology.  ESRD (end stage renal disease) ([name]) -- Continue hemodialysis per nephrology.  New diagnosis this admission.  Septic shock (HCC)-resolved as of 06/18/2021 -- Secondary to aspiration pneumonia, treated with antibiotics, now resolved.  Thrombocytopenia ([name]) -- Thorough evaluation per hematology, conclusion: Sepsis as cause.  Platelets stable.  Monitor.  Type 1 diabetes mellitus with complication (HCC) -- CBG stable, continue sliding scale insulin and  long-acting insulin.  Chronic combined systolic and diastolic CHF (congestive heart failure) ([name]) -- New diagnosis this admission.  Cardiology felt to be nonischemic based on age, no further evaluation was suggested.  Continue amiodarone.  Fluid management per dialysis. -- Add goal-directed therapy  Paroxysmal atrial fibrillation ([name]) -- New diagnosis this admission, followed by cardiology earlier in hospitalization, started on amiodarone.  No anticoagulation secondary to thrombocytopenia.  Symptomatic anemia-resolved as of 06/18/2021 -- Hemoglobin stable status post blood products.  Secondary to ESRD.  Aspiration pneumonia ([name]) --resolved, treated with antibiotics  Complication associated with dialysis catheter -- Per vascular  Pressure injury of buttock, stage 2 ([name])- (present on admission) -- Management per wound care.  Acute respiratory failure with hypoxia (HCC)-resolved as of 06/18/2021 --Resolved  Pressure injury of skin-resolved as of 06/14/2021 -- Per wound care  Cardiopulmonary arrest (HCC)-resolved as of 06/18/2021 -- PEA arrest probably from severe metabolic abnormalities.  AKI (acute kidney injury) (HCC)-resolved as of 06/14/2021, (present on admission) -- Progressed to ESRD        Subjective:  Feels ok  Physical Exam: Vitals:   06/17/21 1541 06/17/21 1956 06/18/21 0524 06/18/21 1601  BP: 120/70 132/76 (!) 143/100 119/69  Pulse: 87 92 79 85  Resp: 18   18  Temp: 98 F (36.7 C) 98.4 F (36.9 C) (!) 97.5 F (36.4 C) 98.6 F (37 C)  TempSrc: Oral Oral Oral   SpO2: 100% 100% 100% 100%  Weight:      Height:       Physical Exam Vitals reviewed.  Constitutional:      General: He is not in acute distress. Cardiovascular:     Rate and Rhythm: Normal rate and regular rhythm.     Heart sounds: No murmur heard. Pulmonary:     Effort: Pulmonary effort is normal. No respiratory distress.     Breath sounds:  No wheezing, rhonchi or rales.  Neurological:      Mental Status: He is alert.  Psychiatric:        Mood and Affect: Mood normal.        Behavior: Behavior normal.     Data Reviewed:  CBG labile  Family Communication: none  Disposition: Status is: Inpatient  Remains inpatient appropriate because: new ESRD, malfunctioning catheter, consideration for CIR    Planned Discharge Destination:  CIR     Time spent: 45 minutes  Author: Murray Hodgkins, MD 06/18/2021 5:22 PM  For on call review [email or web address].

## 2021-06-18 NOTE — Progress Notes (Signed)
Subjective: Interval History: none..   Objective: Vital signs in last 24 hours: Temp:  [97.5 F (36.4 C)-98.4 F (36.9 C)] 97.5 F (36.4 C) (02/05 0524) Pulse Rate:  [79-92] 79 (02/05 0524) Resp:  [18] 18 (02/04 1541) BP: (120-143)/(70-100) 143/100 (02/05 0524) SpO2:  [100 %] 100 % (02/05 0524)  Intake/Output from previous day: No intake/output data recorded. Intake/Output this shift: No intake/output data recorded.  General appearance: alert, cooperative, and no distress Neck: no adenopathy, no JVD, supple, symmetrical, trachea midline, and Right Ij tunneled catheter non-tender.  Dressing dry Chest wall: no tenderness Extremities: Right femoral dialysis catheter dressed  Lab Results: Recent Labs    06/16/21 0509 06/17/21 0613  WBC 7.0 6.3  HGB 8.1* 8.1*  HCT 24.4* 25.2*  PLT 63* 58*   BMET Recent Labs    06/16/21 0543 06/17/21 0613  NA 134* 135  K 4.9 4.4  CL 104 104  CO2 23 24  GLUCOSE 207* 172*  BUN 60* 38*  CREATININE 4.31* 3.43*  CALCIUM 6.3* 6.6*       Assessment/Plan: s/p Procedure(s): DIALYSIS/PERMA CATHETER INSERTION (N/A) Right IJ tunneled dialysis catheter is poorly functional and would not support adequate flows despite multiple maneuvers and dialysis on Friday.  Right femoral catheter is functional but barely so.  We will plan right jugular tunneled catheter replacement tomorrow.  (Likely slightly longer catheter-23 cm tip to cuff).  If this is able to be used successfully at dialysis tomorrow then femoral catheter to be removed in dialysis.  I discussed plans with the patient as well as with the bedside nurse.  Patient has been poorly cooperative and degree of insight appears impaired.   LOS: 18 days   Bertram Savin 06/18/2021, 9:40 AM

## 2021-06-18 NOTE — Assessment & Plan Note (Signed)
--   Per wound care

## 2021-06-18 NOTE — Assessment & Plan Note (Signed)
--  resolved, treated with antibiotics

## 2021-06-18 NOTE — Assessment & Plan Note (Signed)
--   Management per wound care.

## 2021-06-18 NOTE — Assessment & Plan Note (Signed)
Per vascular  

## 2021-06-18 NOTE — PMR Pre-admission (Signed)
PMR Admission Coordinator Pre-Admission Assessment  Patient: Jonathan Mcmillan is an 26 y.o., male MRN: [id number] DOB: 1996/02/21 Height: 5' 7.99" (172.7 cm) Weight: 66.9 kg  Insurance Information  PRIMARY: Uninsured - self pay - no insurance  SECONDARY:       Policy#:      Phone#:   Development worker, community:       Phone#:   The [employer] for patients in Inpatient Rehabilitation Facilities with attached Privacy Act [place] Records was provided and verbally reviewed with: Patient  Emergency Contact Information Contact Information     Name Relation Home Work Mobile   [place] [phone]  [place] [phone]     Dima, Ferrufino   [phone]       Current Medical History  Patient Admitting Diagnosis: Debility, ARF, Respiratory Failure   History of Present Illness: Jonathan Mcmillan is a 26 y.o. 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 Upon arrival to the ER he was noted to have a T-max of 102.6, he was tachycardic and tachypneic. Sodium 131, potassium 4.4, chloride 102, bicarb 15, glucose 177, BUN 19, creatinine 13.86 when compared to baseline of 3.92, calcium 6.7, alkaline phosphatase 70, albumin 2.9, lipase 29, AST 24, ALT 33, total protein 6.3, lactic acid 2.1, white count 12.7, hemoglobin 5.2 compared to baseline of 8.3, hematocrit 15.8, MCV 89.8, RDW 12.8, platelet count 172, PT 15.8, INR 1.3 Respiratory viral panel was  negative, Urine analysis showed proteinuria.  Twelve-lead EKG reviewed by me shows sinus tachycardia with nonspecific T wave changes in the lateral leads.. Pt suffered in-hospital cardiac arrest, suspect due to severe  metabolic acidosis & multiple metabolic derangements. Required CRRT, then transitioned to HD. Pt was extubated on 1/21 but reintubated 1/22. Extubated again on 1/26 & now weaned to room air.PT and OT saw this Pt. And recommended CIR to assist return to PLOF.    Patient's medical record from [facility] has been reviewed by the rehabilitation admission coordinator and physician.  Past Medical History  Past Medical History:  Diagnosis Date   Cannabinoid hyperemesis syndrome    CKD (chronic kidney disease), stage IV (HCC)    Diabetes 1.5, managed as type 1 ([place])    DKA (diabetic ketoacidoses) 05/21/2016   Gastroparesis    HTN (hypertension)    Nicotine dependence    Perirectal abscess 06/16/2017   Right arm cellulitis 01/26/2018    Has the patient had major surgery during 100 days prior to admission? Yes  Family History   family history includes Diabetes in his mother.  Current Medications  Current Facility-Administered Medications:    0.9 %  sodium chloride infusion, , Intravenous, PRN, Lucky Cowboy, Erskine Squibb, MD, Stopped at 06/12/21 2313   amiodarone (PACERONE) tablet 200 mg, 200 mg, Oral, Daily, Dew, Erskine Squibb, MD, 200 mg at 06/18/21 7517   ascorbic acid (VITAMIN C) tablet 500 mg, 500 mg, Oral, BID, Dew, Erskine Squibb, MD, 500 mg at 06/18/21 0935   calcitRIOL (ROCALTROL) capsule 0.5 mcg, 0.5 mcg, Oral, Daily, Korrapati, Madhu, MD, 0.5 mcg at 06/18/21 0935   chlorhexidine gluconate (MEDLINE KIT) (PERIDEX) 0.12 % solution 15 mL, 15 mL, Mouth Rinse, BID, Dew, Erskine Squibb, MD, 15 mL at 06/17/21 2220   Chlorhexidine Gluconate Cloth 2 %  PADS 6 each, 6 each, Topical, Daily, Dew, Erskine Squibb, MD, 6 each at 06/16/21 1555   Chlorhexidine Gluconate Cloth 2 % PADS 6 each, 6 each, Topical, Q0600, Algernon Huxley, MD, 6 each at 06/18/21 0526   collagenase (SANTYL) ointment, , Topical, Daily, Algernon Huxley, MD, Given at 06/16/21 2957   docusate (COLACE) 50 MG/5ML liquid 100 mg, 100 mg, Oral, BID PRN, Lucky Cowboy,  Erskine Squibb, MD   feeding supplement (NEPRO CARB STEADY) liquid 237 mL, 237 mL, Oral, BID BM, Algernon Huxley, MD, 237 mL at 06/16/21 0927   fentaNYL (SUBLIMAZE) injection 50 mcg, 50 mcg, Intravenous, Q2H PRN, Algernon Huxley, MD, 50 mcg at 06/15/21 1433   ferrous sulfate tablet 325 mg, 325 mg, Oral, Q breakfast, Hall, Carole N, DO, 325 mg at 06/18/21 0935   guaiFENesin-dextromethorphan (ROBITUSSIN DM) 100-10 MG/5ML syrup 10 mL, 10 mL, Oral, Q6H PRN, Dew, Erskine Squibb, MD   hydrocerin (EUCERIN) cream, , Topical, BID, Algernon Huxley, MD, Given at 06/17/21 2219   insulin aspart (novoLOG) injection 0-15 Units, 0-15 Units, Subcutaneous, TID WC, Algernon Huxley, MD, 3 Units at 06/18/21 0933   insulin glargine-yfgn (SEMGLEE) injection 5 Units, 5 Units, Subcutaneous, QHS, Dew, Erskine Squibb, MD, 5 Units at 06/16/21 0024   ipratropium-albuterol (DUONEB) 0.5-2.5 (3) MG/3ML nebulizer solution 3 mL, 3 mL, Nebulization, Q6H PRN, Algernon Huxley, MD, 3 mL at 06/10/21 0808   lip balm (BLISTEX) ointment, , Topical, PRN, Algernon Huxley, MD, 1 application at 47/34/03 1829   MEDLINE mouth rinse, 15 mL, Mouth Rinse, BID, Dew, Erskine Squibb, MD, 15 mL at 06/17/21 2222   multivitamin (RENA-VIT) tablet 1 tablet, 1 tablet, Oral, QHS, Dew, Erskine Squibb, MD, 1 tablet at 06/17/21 2220   ondansetron (ZOFRAN) tablet 4 mg, 4 mg, Oral, Q6H PRN **OR** ondansetron (ZOFRAN) injection 4 mg, 4 mg, Intravenous, Q6H PRN, Algernon Huxley, MD, 4 mg at 06/11/21 0255   oxyCODONE (Oxy IR/ROXICODONE) immediate release tablet 5 mg, 5 mg, Oral, Q4H PRN, Algernon Huxley, MD, 5 mg at 06/18/21 0935   pantoprazole (PROTONIX) EC tablet 40 mg, 40 mg, Oral, Daily, Dew, Erskine Squibb, MD, 40 mg at 06/18/21 0935   polyvinyl alcohol (LIQUIFILM TEARS) 1.4 % ophthalmic solution 2 drop, 2 drop, Both Eyes, PRN, Samuella Cota, MD, 2 drop at 06/17/21 2221   sodium chloride flush (NS) 0.9 % injection 10-40 mL, 10-40 mL, Intracatheter, Q12H, Dew, Jason S, MD, 10 mL at 06/17/21 2223   sodium chloride flush  (NS) 0.9 % injection 10-40 mL, 10-40 mL, Intracatheter, PRN, Algernon Huxley, MD, 10 mL at 06/11/21 0908   thiamine tablet 100 mg, 100 mg, Oral, Daily, Dorothe Pea, RPH, 100 mg at 06/18/21 0935   zinc sulfate capsule 220 mg, 220 mg, Oral, Daily, Dew, Erskine Squibb, MD, 220 mg at 06/18/21 0935  Patients Current Diet:  Diet Order             Diet NPO time specified  Diet effective midnight           Diet renal/carb modified with fluid restriction Diet-HS Snack? Nothing; Fluid restriction: 1200 mL Fluid; Room service appropriate? Yes; Fluid consistency: Thin  Diet effective now                   Precautions / Restrictions Precautions Precautions: Fall Precaution Comments: R temporary femoral dialysis catheter: per Dr. Holley Raring, nephrology, ok for SPTs bed to/from chair, no walking, do not elevate HOB  or chair past 60 deg Restrictions Weight Bearing Restrictions: No   Has the patient had 2 or more falls or a fall with injury in the past year? No  Prior Activity Level Community (5-7x/wk): Pt. active in the community and working PTA  Prior Functional Level Self Care: Did the patient need help bathing, dressing, using the toilet or eating? Independent  Indoor Mobility: Did the patient need assistance with walking from room to room (with or without device)? Independent  Stairs: Did the patient need assistance with internal or external stairs (with or without device)? Independent  Functional Cognition: Did the patient need help planning regular tasks such as shopping or remembering to take medications? Independent  Patient Information Are you of Hispanic, Latino/a,or Spanish origin?: A. No, not of Hispanic, Latino/a, or Spanish origin What is your race?: B. Black or African American Do you need or want an interpreter to communicate with a doctor or health care staff?: 0. No  Patient's Response To:  Health Literacy and Transportation Is the patient able to respond to health literacy  and transportation needs?: Yes Health Literacy - How often do you need to have someone help you when you read instructions, pamphlets, or other written material from your doctor or pharmacy?: Never In the past 12 months, has lack of transportation kept you from medical appointments or from getting medications?: No In the past 12 months, has lack of transportation kept you from meetings, work, or from getting things needed for daily living?: No  Development worker, international aid / [place] Devices/Equipment: None  Prior Device Use: Indicate devices/aids used by the patient prior to current illness, exacerbation or injury? None of the above  Current Functional Level Cognition  Overall Cognitive Status: Within Functional Limits for tasks assessed Orientation Level: Oriented X4 General Comments: Pt is oriented x4, appropriate, but slow to respond    Extremity Assessment (includes Sensation/Coordination)  Upper Extremity Assessment: Generalized weakness  Lower Extremity Assessment: Generalized weakness    ADLs  Overall ADL's : Needs assistance/impaired General ADL Comments: MAX A don/doff B socks at bed level. MOD A bathing at bed level - pt completes UB with cues for thoroughness. MAX A x2 toileting at bed level - pt continues to be incontinent of stool - RN in to place rectal tube. MIN A don/doff gown at bed level. MOD A x2 for ADL t/f    Mobility  Overal bed mobility: Needs Assistance Bed Mobility: Supine to Sit Rolling: Min guard Supine to sit: Min assist Sit to supine: Mod assist General bed mobility comments: assist for BLE and trunk control    Transfers  Overall transfer level: Needs assistance Equipment used: 2 person hand held assist Transfers: Bed to chair/wheelchair/BSC Bed to/from chair/wheelchair/BSC transfer type:: Stand pivot Stand pivot transfers: Mod assist, From elevated surface General transfer comment: Mod A and mod verbal and visual cues for sequencing from  elevated EOB    Ambulation / Gait / Stairs / Wheelchair Mobility  Ambulation/Gait General Gait Details: N/A, contraindicated    Posture / Balance Balance Overall balance assessment: Needs assistance Sitting-balance support: No upper extremity supported, Feet supported Sitting balance-Leahy Scale: Fair Standing balance support: Bilateral upper extremity supported Standing balance-Leahy Scale: Poor    Special needs/care consideration Dialysis: Hemodialysis Monday, Wednesday, and Friday, Skin B Heel wounds, buttocks wound and sacral wounds, and Diabetic management Yes, h/o DM   Previous Home Environment (from acute therapy documentation) Living Arrangements: Other relatives (2 aunts)  Lives With: Family Available Help  at Discharge: Family, Available PRN/intermittently Type of Home: House Home Layout: One level Home Access: Ramped entrance Entrance Stairs-Number of Steps: 6 Bathroom Shower/Tub: Tub only, Tub/shower unit Bathroom Toilet: Handicapped height Bathroom Accessibility: Yes How Accessible: Accessible via walker, Accessible via wheelchair [place]: No  Discharge Living Setting Plans for Discharge Living Setting: Patient's home Type of Home at Discharge: House Discharge Home Layout: One level Discharge Home Access: [place] entrance Discharge [place]: Handicapped height Discharge Bathroom Accessibility: Yes How Accessible: Accessible via wheelchair, Accessible via walker Does the patient have any problems obtaining your medications?: No  Social/Family/Support Systems Patient Roles: Other (Comment) Contact Information: [phone] Anticipated Caregiver: Lynelle Smoke (aunt) Ability/Limitations of Caregiver: Works days, but other family will be there with Pt. while she works Careers adviser: 24/7 Discharge Plan Discussed with Primary Caregiver: Yes Is Caregiver In Agreement with Plan?: Yes Does Caregiver/Family have Issues with Lodging/Transportation  while Pt is in Rehab?: Yes  Goals Patient/Family Goal for Rehab: PT/OT/SLP Supervisoin Expected length of stay: 12-14 days Pt/Family Agrees to Admission and willing to participate: Yes Program Orientation Provided & Reviewed with Pt/Caregiver Including Roles  & Responsibilities: Yes  Decrease burden of Care through IP rehab admission: Specialzed equipment needs, Decrease number of caregivers, Bowel and bladder program, and Patient/family education  Possible need for SNF placement upon discharge: not anticipated  Patient Condition: I have reviewed medical records from [facility], spoken with CM, and patient. I met with patient at the bedside for inpatient rehabilitation assessment.  Patient will benefit from ongoing PT and OT, can actively participate in 3 hours of therapy a day 5 days of the week, and can make measurable gains during the admission.  Patient will also benefit from the coordinated team approach during an Inpatient Acute Rehabilitation admission.  The patient will receive intensive therapy as well as Rehabilitation physician, nursing, social worker, and care management interventions.  Due to bladder management, bowel management, safety, skin/wound care, disease management, medication administration, pain management, and patient education the patient requires 24 hour a day rehabilitation nursing.  The patient is currently minimum assist with mobility and basic ADLs.  Discharge setting and therapy post discharge at home with home health is anticipated.  Patient has agreed to participate in the Acute Inpatient Rehabilitation Program and will admit today.  Preadmission Screen Completed By:  Genella Mech, 06/18/2021 11:12 AM ______________________________________________________________________   Discussed status with Dr. Dagoberto Ligas on 06/22/21 at 1000 and received approval for admission today.  Admission Coordinator:  Genella Mech, CCC-SLP, time 1126/Date 06/22/21    Assessment/Plan: Diagnosis: Does the need for close, 24 hr/day Medical supervision in concert with the patient's rehab needs make it unreasonable for this patient to be served in a less intensive setting? Yes Co-Morbidities requiring supervision/potential complications: new ESRD, cardiac arrest, DKA, DM1, s/p resp failure, debility Due to bladder management, bowel management, safety, skin/wound care, disease management, medication administration, pain management, and patient education, does the patient require 24 hr/day rehab nursing? Yes Does the patient require coordinated care of a physician, rehab nurse, PT, OT, and SLP to address physical and functional deficits in the context of the above medical diagnosis(es)? Yes Addressing deficits in the following areas: balance, endurance, locomotion, strength, transferring, bowel/bladder control, bathing, dressing, feeding, grooming, and toileting Can the patient actively participate in an intensive therapy program of at least 3 hrs of therapy 5 days a week? Yes The potential for patient to make measurable gains while on inpatient rehab is good Anticipated  functional outcomes upon discharge from inpatient rehab: modified independent and supervision PT, modified independent and supervision OT, modified independent and supervision SLP Estimated rehab length of stay to reach the above functional goals is: 12-14 days Anticipated discharge destination: Home 10. Overall Rehab/Functional Prognosis: good   MD Signature:

## 2021-06-18 NOTE — Plan of Care (Signed)
  Problem: Health Behavior/Discharge Planning: Goal: Ability to manage health-related needs will improve Outcome: Progressing   

## 2021-06-18 NOTE — Progress Notes (Signed)
[facility]  PROGRESS NOTE   Subjective:   Patient seen at bedside. Had good appetite. Denies any chest pain, shortness of breath.  Objective:  Vital signs in last 24 hours:  Temp:  [97.5 F (36.4 C)-98.4 F (36.9 C)] 97.5 F (36.4 C) (02/05 0524) Pulse Rate:  [79-92] 79 (02/05 0524) Resp:  [18] 18 (02/04 1541) BP: (120-143)/(70-100) 143/100 (02/05 0524) SpO2:  [100 %] 100 % (02/05 0524)  Weight change:  Filed Weights   06/14/21 0941 06/14/21 1218 06/17/21 0500  Weight: 63.7 kg 64.7 kg 66.9 kg    Intake/Output: No intake/output data recorded.   Intake/Output this shift:  No intake/output data recorded.  Physical Exam: General:  No acute distress  Head:  Normocephalic, atraumatic. Moist oral mucosal membranes  Eyes:  Anicteric  Neck:  Supple  Lungs:   Clear to auscultation, normal effort  Heart:  S1S2 no rubs  Abdomen:   Soft, nontender, bowel sounds present  Extremities:  peripheral edema.  Neurologic:  Awake, alert, following commands  Skin:  No lesions  Access:     Basic Metabolic Panel: Recent Labs  Lab 06/12/21 0339 06/13/21 0404 06/14/21 0346 06/15/21 0402 06/16/21 0543 06/17/21 0613  NA 134* 133* 130* 136 134* 135  K 3.8 3.6 4.3 4.3 4.9 4.4  CL 104 100 98 105 104 104  CO2 22 24 22 24 23 24   GLUCOSE 151* 50* 267* 146* 207* 172*  BUN 69* 56* 68* 51* 60* 38*  CREATININE 2.71* 2.81* 3.54* 3.33* 4.31* 3.43*  CALCIUM 6.5* 6.6* 6.3* 6.5* 6.3* 6.6*  MG 2.8* 2.4 2.6*  --  2.3  --   PHOS 4.4 3.7 4.3  --  4.5 3.8    CBC: Recent Labs  Lab 06/13/21 0404 06/14/21 0346 06/15/21 0402 06/16/21 0509 06/17/21 0613  WBC 9.9 9.3 8.2 7.0 6.3  HGB 9.5* 9.2* 8.8* 8.1* 8.1*  HCT 28.3* 26.9* 26.2* 24.4* 25.2*  MCV 87.6 87.3 87.3 88.7 91.6  PLT 46* 50* 58* 63* 58*     Urinalysis: No results for input(s): COLORURINE, LABSPEC, PHURINE, GLUCOSEU, HGBUR, BILIRUBINUR, KETONESUR, PROTEINUR, UROBILINOGEN, NITRITE, LEUKOCYTESUR in the last 72  hours.  Invalid input(s): APPERANCEUR    Imaging: No results found.   Medications:    sodium chloride Stopped (06/12/21 2313)    amiodarone  200 mg Oral Daily   vitamin C  500 mg Oral BID   calcitRIOL  0.5 mcg Oral Daily   chlorhexidine gluconate (MEDLINE KIT)  15 mL Mouth Rinse BID   Chlorhexidine Gluconate Cloth  6 each Topical Daily   Chlorhexidine Gluconate Cloth  6 each Topical Q0600   collagenase   Topical Daily   feeding supplement (NEPRO CARB STEADY)  237 mL Oral BID BM   ferrous sulfate  325 mg Oral Q breakfast   hydrocerin   Topical BID   insulin aspart  0-15 Units Subcutaneous TID WC   insulin glargine-yfgn  5 Units Subcutaneous QHS   mouth rinse  15 mL Mouth Rinse BID   multivitamin  1 tablet Oral QHS   pantoprazole  40 mg Oral Daily   sodium chloride flush  10-40 mL Intracatheter Q12H   thiamine  100 mg Oral Daily   zinc sulfate  220 mg Oral Daily    Assessment/ Plan:     Principal Problem:   Septic shock (HCC) Active Problems:   Hypertension   Acute kidney injury superimposed on CKD ([place])   Type 1 diabetes mellitus with complication ([place])  Nicotine dependence   Symptomatic anemia   Cardiopulmonary arrest (HCC)   Acute respiratory failure with hypoxia (HCC)   Thrombocytopenia (HCC)   Anemia in chronic kidney disease, on chronic dialysis (HCC)   Paroxysmal atrial fibrillation (HCC)   Chronic combined systolic and diastolic CHF (congestive heart failure) (HCC)   Elevated troponin   Hypocalcemia   Pressure injury of buttock, stage 2 (HCC)   Complication associated with dialysis catheter  26 year old male with history of diabetes, peripheral vascular disease, hypertension, coronary artery disease, history of substance abuse, chronic kidney disease now with renal failure leading to dialysis dependence.  Was initially on CRRT but presently on intermittent hemodialysis.   #1: ESRD: Patient is now end-stage renal disease we will continue dialysis via  permacath.  Dialysis orders written for tomorrow.   #2: Sepsis: Now improved and is off of antibiotics.   #3: Anemia: Possibly secondary to chronic kidney disease.  Will follow anemia protocol.   #4: Hypertension/congestive heart failure: Advised on importance of 1000 cc fluid restriction.  We will continue to attempt to remove fluid at dialysis.   #5: Bone mineral metabolism: Hypocalcemia secondary hyperparathyroidism of CKD.  Hypocalcemia possibly secondary to chronic kidney disease.  We will start supplementing with calcitriol.   We will continue to follow closely.   LOS: [place], [facility] 2/5/20232:13 PM

## 2021-06-19 ENCOUNTER — Encounter: Payer: Self-pay | Admitting: Vascular Surgery

## 2021-06-19 ENCOUNTER — Encounter
Admission: EM | Disposition: A | Discharge status: Rehabilitation Facility | Payer: Self-pay | Source: Home / Self Care | Attending: Internal Medicine

## 2021-06-19 ENCOUNTER — Other Ambulatory Visit (INDEPENDENT_AMBULATORY_CARE_PROVIDER_SITE_OTHER): Payer: Self-pay | Admitting: Nurse Practitioner

## 2021-06-19 DIAGNOSIS — N185 Chronic kidney disease, stage 5: Secondary | ICD-10-CM

## 2021-06-19 HISTORY — PX: DIALYSIS/PERMA CATHETER INSERTION: CATH118288

## 2021-06-19 LAB — RENAL FUNCTION PANEL
Albumin: 1.8 g/dL — ABNORMAL LOW (ref 3.5–5.0)
Anion gap: 8 (ref 5–15)
BUN: 50 mg/dL — ABNORMAL HIGH (ref 6–20)
CO2: 24 mmol/L (ref 22–32)
Calcium: 6.7 mg/dL — ABNORMAL LOW (ref 8.9–10.3)
Chloride: 106 mmol/L (ref 98–111)
Creatinine, Ser: 5.03 mg/dL — ABNORMAL HIGH (ref 0.61–1.24)
GFR, Estimated: 15 mL/min — ABNORMAL LOW (ref >60)
Glucose, Bld: 98 mg/dL (ref 70–99)
Phosphorus: 5.2 mg/dL — ABNORMAL HIGH (ref 2.5–4.6)
Potassium: 4.8 mmol/L (ref 3.5–5.1)
Sodium: 138 mmol/L (ref 135–145)

## 2021-06-19 LAB — GLUCOSE, CAPILLARY
Glucose-Capillary: <10 mg/dL — CL (ref 70–99)
Glucose-Capillary: 103 mg/dL — ABNORMAL HIGH (ref 70–99)
Glucose-Capillary: 87 mg/dL (ref 70–99)
Glucose-Capillary: 69 mg/dL — ABNORMAL LOW (ref 70–99)
Glucose-Capillary: 70 mg/dL (ref 70–99)
Glucose-Capillary: 107 mg/dL — ABNORMAL HIGH (ref 70–99)
Glucose-Capillary: 126 mg/dL — ABNORMAL HIGH (ref 70–99)
Glucose-Capillary: 144 mg/dL — ABNORMAL HIGH (ref 70–99)
Glucose-Capillary: 108 mg/dL — ABNORMAL HIGH (ref 70–99)

## 2021-06-19 LAB — CBC
HCT: 25 % — ABNORMAL LOW (ref 39.0–52.0)
Hemoglobin: 7.9 g/dL — ABNORMAL LOW (ref 13.0–17.0)
MCH: 29.3 pg (ref 26.0–34.0)
MCHC: 31.6 g/dL (ref 30.0–36.0)
MCV: 92.6 fL (ref 80.0–100.0)
Platelets: 70 10*3/uL — ABNORMAL LOW (ref 150–400)
RBC: 2.7 MIL/uL — ABNORMAL LOW (ref 4.22–5.81)
RDW: 15.1 % (ref 11.5–15.5)
WBC: 9.2 10*3/uL (ref 4.0–10.5)
nRBC: 0 % (ref 0.0–0.2)

## 2021-06-19 LAB — RETICULOCYTES
Immature Retic Fract: 3.8 % (ref 2.3–15.9)
RBC.: 2.67 MIL/uL — ABNORMAL LOW (ref 4.22–5.81)
Retic Count, Absolute: 46.5 10*3/uL (ref 19.0–186.0)
Retic Ct Pct: 1.7 % (ref 0.4–3.1)

## 2021-06-19 LAB — IRON AND TIBC
Iron: 80 ug/dL (ref 45–182)
Saturation Ratios: 48 % — ABNORMAL HIGH (ref 17.9–39.5)
TIBC: 167 ug/dL — ABNORMAL LOW (ref 250–450)
UIBC: 87 ug/dL

## 2021-06-19 LAB — FERRITIN: Ferritin: 478 ng/mL — ABNORMAL HIGH (ref 24–336)

## 2021-06-19 LAB — FOLATE: Folate: 7.9 ng/mL (ref >5.9)

## 2021-06-19 SURGERY — DIALYSIS/PERMA CATHETER INSERTION
Anesthesia: Moderate Sedation

## 2021-06-19 MED ORDER — METHYLPREDNISOLONE SODIUM SUCC 125 MG IJ SOLR: 125.0000 mg | Freq: Once | INTRAMUSCULAR | Status: DC | PRN

## 2021-06-19 MED ORDER — MIDAZOLAM HCL 2 MG/2ML IJ SOLN
INTRAMUSCULAR | Status: AC
  Filled 2021-06-19: qty 2

## 2021-06-19 MED ORDER — SODIUM CHLORIDE 0.9 % IV SOLN: INTRAVENOUS | Status: DC

## 2021-06-19 MED ORDER — PROSOURCE PLUS PO LIQD
30.0000 mL | Freq: Three times a day (TID) | ORAL | Status: DC
  Administered 2021-06-20 – 2021-06-22 (×2): 30 mL via ORAL
  Filled 2021-06-19 (×10): qty 30

## 2021-06-19 MED ORDER — CALCIUM CARBONATE ANTACID 500 MG PO CHEW
1.0000 | CHEWABLE_TABLET | Freq: Three times a day (TID) | ORAL | Status: DC
  Administered 2021-06-20 – 2021-06-22 (×2): 200 mg via ORAL
  Filled 2021-06-19 (×7): qty 1

## 2021-06-19 MED ORDER — MIDAZOLAM HCL 2 MG/ML PO SYRP: 8.0000 mg | ORAL_SOLUTION | Freq: Once | ORAL | Status: DC | PRN

## 2021-06-19 MED ORDER — PROSOURCE TF PO LIQD: 45.0000 mL | Freq: Three times a day (TID) | ORAL | Status: DC

## 2021-06-19 MED ORDER — CINACALCET HCL 30 MG PO TABS
30.0000 mg | ORAL_TABLET | Freq: Every day | ORAL | Status: DC
  Administered 2021-06-20 – 2021-06-22 (×2): 30 mg via ORAL
  Filled 2021-06-19 (×3): qty 1

## 2021-06-19 MED ORDER — LOPERAMIDE HCL 2 MG PO CAPS
2.0000 mg | ORAL_CAPSULE | Freq: Two times a day (BID) | ORAL | Status: DC
  Administered 2021-06-19: 2 mg via ORAL
  Filled 2021-06-19: qty 1

## 2021-06-19 MED ORDER — DIPHENHYDRAMINE HCL 50 MG/ML IJ SOLN: 50.0000 mg | Freq: Once | INTRAMUSCULAR | Status: DC | PRN

## 2021-06-19 MED ORDER — CEFAZOLIN SODIUM-DEXTROSE 1-4 GM/50ML-% IV SOLN
1.0000 g | Freq: Once | INTRAVENOUS | Status: DC
  Administered 2021-06-19: 1 g via INTRAVENOUS

## 2021-06-19 MED ORDER — MIDAZOLAM HCL 2 MG/2ML IJ SOLN
INTRAMUSCULAR | Status: DC | PRN
  Administered 2021-06-19: 1 mg via INTRAVENOUS

## 2021-06-19 MED ORDER — HYDROMORPHONE HCL 1 MG/ML IJ SOLN: 1.0000 mg | Freq: Once | INTRAMUSCULAR | Status: DC | PRN

## 2021-06-19 MED ORDER — HEPARIN SODIUM (PORCINE) 1000 UNIT/ML IJ SOLN
INTRAMUSCULAR | Status: AC
  Filled 2021-06-19: qty 10

## 2021-06-19 MED ORDER — ONDANSETRON HCL 4 MG/2ML IJ SOLN: 4.0000 mg | Freq: Four times a day (QID) | INTRAMUSCULAR | Status: DC | PRN

## 2021-06-19 MED ORDER — FAMOTIDINE 20 MG PO TABS: 40.0000 mg | ORAL_TABLET | Freq: Once | ORAL | Status: DC | PRN

## 2021-06-19 MED ORDER — FENTANYL CITRATE (PF) 100 MCG/2ML IJ SOLN
INTRAMUSCULAR | Status: AC
  Filled 2021-06-19: qty 2

## 2021-06-19 MED ORDER — FENTANYL CITRATE (PF) 100 MCG/2ML IJ SOLN
INTRAMUSCULAR | Status: DC | PRN
  Administered 2021-06-19: 25 ug via INTRAVENOUS

## 2021-06-19 SURGICAL SUPPLY — 6 items
BIOPATCH RED 1 DISK 7.0 (GAUZE/BANDAGES/DRESSINGS) ×1 IMPLANT
CATH PALIN MAXID VT KIT 19CM (CATHETERS) ×1 IMPLANT
GUIDEWIRE SUPER STIFF .035X180 (WIRE) ×1 IMPLANT
PACK ANGIOGRAPHY (CUSTOM PROCEDURE TRAY) ×1 IMPLANT
SUT MNCRL AB 4-0 PS2 18 (SUTURE) ×1 IMPLANT
SUT PROLENE 0 CT 1 30 (SUTURE) ×1 IMPLANT

## 2021-06-19 NOTE — Progress Notes (Signed)
Physical Therapy Treatment Patient Details Name: Jonathan Mcmillan MRN: [id number] DOB: 1996-05-04 Today's Date: 06/19/2021   History of Present Illness Pt is a 26 y/o M admitted on 06/01/21 with AKI on CKD in setting of viral syndrome & diarrhea. Pt suffered in-hospital cardiac arrest, suspect due to severe metabolic acidosis & multiple metabolic derangements. Required CRRT and now preparing to undergo regular HD with R temp femoral catheter placed on 1/28. Pt was extubated on 1/21 but reintubated 1/22. Extubated again on 1/26 & now weaned to room air. PMH: DM2, CKD stage IV, HTN, nicotine dependence.    PT Comments    Pt was pleasant and motivated to participate during the session and put forth good effort throughout. Pt required decreased assist during SPT from elevated EOB to chair this session.  Pt reported no adverse symptoms during the session with SpO2 and HR WNL. Awaiting removal of R fem temp cath for further activity progression. Pt will benefit from PT services in an IR setting upon discharge to safely address deficits listed in patient problem list for decreased caregiver assistance and eventual return to PLOF.     Recommendations for follow up therapy are one component of a multi-disciplinary discharge planning process, led by the attending physician.  Recommendations may be updated based on patient status, additional functional criteria and insurance authorization.  Follow Up Recommendations  Acute inpatient rehab (3hours/day)     Assistance Recommended at Discharge Frequent or constant Supervision/Assistance  Patient can return home with the following Two people to help with walking and/or transfers;Two people to help with bathing/dressing/bathroom;Direct supervision/assist for medications management;Help with stairs or ramp for entrance;Assistance with feeding;Assist for transportation;Assistance with cooking/housework;Direct supervision/assist for financial management   Equipment  Recommendations  None recommended by PT    Recommendations for Other Services       Precautions / Restrictions Precautions Precautions: Fall Precaution Comments: R temporary femoral dialysis catheter: per Dr. Holley Raring, nephrology, ok for SPTs bed to/from chair, no walking, do not elevate HOB or chair past 60 deg Restrictions Weight Bearing Restrictions: No     Mobility  Bed Mobility Overal bed mobility: Needs Assistance       Supine to sit: Min assist     General bed mobility comments: assist for BLE and trunk control    Transfers Overall transfer level: Needs assistance Equipment used: None     Stand pivot transfers: Min assist         General transfer comment: Min A and mod verbal cues for sequencing from elevated EOB    Ambulation/Gait               General Gait Details: N/A, contraindicated   Stairs             Wheelchair Mobility    Modified Rankin (Stroke Patients Only)       Balance Overall balance assessment: Needs assistance Sitting-balance support: No upper extremity supported, Feet supported Sitting balance-Leahy Scale: Fair                                      Cognition Arousal/Alertness: Awake/alert Behavior During Therapy: Flat affect Overall Cognitive Status: Within Functional Limits for tasks assessed  Exercises Total Joint Exercises Ankle Circles/Pumps: AROM, Strengthening, Both, 5 reps, 10 reps (with manual resistance) Quad Sets: Strengthening, Both, 10 reps Gluteal Sets: Strengthening, Both, 10 reps Short Arc Quad: AROM, Strengthening, Both, 10 reps Hip ABduction/ADduction: AROM, AAROM, Strengthening, Left, 10 reps Straight Leg Raises: AROM, AAROM, Strengthening, Left, 10 reps Long Arc Quad: AROM, Strengthening, Both, 10 reps, 5 reps Knee Flexion: AROM, Strengthening, Both, 5 reps, 10 reps Other Exercises Other Exercises: HEP review for BLE  APs, QS, and GS x 10 each every 1-2 hours daily    General Comments        Pertinent Vitals/Pain Pain Assessment Pain Assessment: No/denies pain    Home Living                          Prior Function            PT Goals (current goals can now be found in the care plan section) Progress towards PT goals: Progressing toward goals    Frequency    Min 2X/week      PT Plan Current plan remains appropriate    Co-evaluation              AM-PAC PT "6 Clicks" Mobility   Outcome Measure  Help needed turning from your back to your side while in a flat bed without using bedrails?: A Little Help needed moving from lying on your back to sitting on the side of a flat bed without using bedrails?: A Little Help needed moving to and from a bed to a chair (including a wheelchair)?: A Little Help needed standing up from a chair using your arms (e.g., wheelchair or bedside chair)?: A Little Help needed to walk in hospital room?: Total Help needed climbing 3-5 steps with a railing? : Total 6 Click Score: 14    End of Session Equipment Utilized During Treatment: Gait belt Activity Tolerance: Patient tolerated treatment well Patient left: in chair;with call bell/phone within reach;with chair alarm set;Other (comment) (reclined in chair to grossly 45 deg) Nurse Communication: Mobility status;Precautions PT Visit Diagnosis: Difficulty in walking, not elsewhere classified (R26.2);Muscle weakness (generalized) (M62.81)     Time: 0100-7121 PT Time Calculation (min) (ACUTE ONLY): 23 min  Charges:  $Therapeutic Exercise: 8-22 mins $Therapeutic Activity: 8-22 mins                     D. Scott Madalynn Pickelsimer PT, DPT 06/19/21, 11:24 AM

## 2021-06-19 NOTE — Progress Notes (Signed)
Nutrition Follow-up  DOCUMENTATION CODES:   Not applicable  INTERVENTION:   Pro-Source Plus 30ml po TID- Each supplement provides 100kcal and 15g protein   Rena-vit po daily   Double protein with meal trays  Vitamin C 500mg  po BID  Zinc 220mg  po daily x 14 days   NUTRITION DIAGNOSIS:   Inadequate oral intake related to inability to eat (pt sedated and ventilated) as evidenced by NPO status.  GOAL:   Patient will meet greater than or equal to 90% of their needs -progressing   MONITOR:   PO intake, Supplement acceptance, Labs, Weight trends, Skin, I & O's  ASSESSMENT:   26 y.o. male with medical history significant for type 1 diabetes mellitus, stage IV chronic kidney disease, hypertension and nicotine dependence who presents to the emergency room for evaluation of a 3-day history of fever, chills, nonproductive cough, myalgias and diarrhea. Pt found to have SIRS, AKI and anemia.  Pt with improved appetite and oral intake; pt eating 100% of meals in hospital. Pt is refusing Nepro supplements. Pt is getting double protein with meals. RD will discontinue Nepro as pt is refusing it. RD will add Pro-Source Plus to add additional protein. Per chart, pt is up ~11lbs since admit. Pt now with ESRD and continues on HD. Plan is for CIR at discharge.   Medications reviewed and include: vitamin C, calcitriol, ferrous sulfate, insulin, rena-vit, protonix, thiamine, zinc   Labs reviewed: K 4.8 wnl, BUN 50(H), creat 5.03(H), P 5.2(H) Hgb 7.9(L), Hct 25.0(L) Cbgs- 126, 107, 69, 70, 87, 103 x 24 hrs  Diet Order:   Diet Order             Diet renal/carb modified with fluid restriction Diet-HS Snack? Nothing; Fluid restriction: 1200 mL Fluid; Room service appropriate? Yes; Fluid consistency: Thin  Diet effective now                  EDUCATION NEEDS:   No education needs have been identified at this time  Skin:  Skin Assessment: Skin Integrity Issues: Skin Integrity Issues::  Stage II, Other (Comment) Stage II: rt buttocks Other: full thickness wound to mid sacrum, wounds to bilateral heels (pink)  Last BM:  2/6- type 7  Height:   Ht Readings from Last 1 Encounters:  06/19/21 5' 7.99" (1.727 m)    Weight:   Wt Readings from Last 1 Encounters:  06/19/21 71.1 kg    Ideal Body Weight:  70 kg  BMI:  Body mass index is 23.84 kg/m.  Estimated Nutritional Needs:   Kcal:  2100-2300kcal/day   Protein:  105-120 grams/day   Fluid:  1000 ml + UOP  Koleen Distance MS, RD, LDN Please refer to [facility] for RD and/or RD on-call/weekend/after hours pager

## 2021-06-19 NOTE — Op Note (Signed)
OPERATIVE NOTE    PRE-OPERATIVE DIAGNOSIS: 1. ESRD 2. Non-functional permcath  POST-OPERATIVE DIAGNOSIS: same as above  PROCEDURE: Fluoroscopic guidance for placement of catheter Placement of a 19 cm tip to cuff tunneled hemodialysis catheter via the right internal jugular vein and removal of previous catheter  SURGEON: [name], MD  ANESTHESIA:  Local with moderate conscious sedation for 6 minutes using 1 mg of Versed and 25 mcg of Fentanyl  ESTIMATED BLOOD LOSS: 3 cc  FINDING(S): none  SPECIMEN(S):  None  INDICATIONS:   Patient is a 26 y.o.male who presents with non-functional dialysis catheter and ESRD.  The patient needs long term dialysis access for their ESRD, and a Permcath is necessary.  Risks and benefits are discussed and informed consent is obtained.    DESCRIPTION: After obtaining full informed written consent, the patient was brought back to the vascular suite. The patient received moderate conscious sedation during a face-to-face encounter with me present throughout the entire procedure and supervising the RN monitoring the vital signs, pulse oximetry, telemetry, and mental status throughout the entire procedure. The patient's existing catheter, right neck and chest were sterilely prepped and draped in a sterile surgical field was created.  The existing catheter was dissected free from the fibrous sheath securing the cuff with hemostats and blunt dissection.  A wire was placed. The existing catheter was then removed and the wire used to keep venous access. I selected a 19 cm tip to cuff tunneled dialysis catheter.  Using fluoroscopic guidance the catheter tips were parked in the right atrium. The appropriate distal connectors were placed. It withdrew blood well and flushed easily with heparinized saline and a concentrated heparin solution was then placed. It was secured to the chest wall with 2 Prolene sutures. A 4-0 Monocryl pursestring suture was placed around the exit  site. Sterile dressings were placed. The patient tolerated the procedure well and was taken to the recovery room in stable condition.  COMPLICATIONS: None  CONDITION: Stable  [name] 06/19/2021 11:16 AM   This note was created with Dragon Medical transcription system. Any errors in dictation are purely unintentional.

## 2021-06-19 NOTE — Interval H&P Note (Signed)
History and Physical Interval Note:  06/19/2021 10:36 AM  Jonathan Mcmillan  has presented today for surgery, with the diagnosis of esrd, poorly functional permcath.  The various methods of treatment have been discussed with the patient and family. After consideration of risks, benefits and other options for treatment, the patient has consented to  Procedure(s): DIALYSIS/PERMA CATHETER INSERTION (N/A) as a surgical intervention.  The patient's history has been reviewed, patient examined, no change in status, stable for surgery.  I have reviewed the patient's chart and labs.  Questions were answered to the patient's satisfaction.     [name]

## 2021-06-19 NOTE — Progress Notes (Signed)
Inpatient Rehab Admissions Coordinator:   I do not have a CIR bed for this pt. Today but will follow for admission once Pt. Is  medically ready. I reached out to renal navigator regarding plan for outpatient HD.   Clemens Catholic, [place], [place] Admissions Coordinator  [phone] ([place]) [phone] (office)

## 2021-06-19 NOTE — Progress Notes (Signed)
OT Cancellation Note  Patient Details Name: Jonathan Mcmillan MRN: [id number] DOB: 1996-04-02   Cancelled Treatment:    Reason Eval/Treat Not Completed: Patient at procedure or test/ unavailable. Pt off the floor for procedure this AM and off to HD this afternoon. Will follow POC as able next date.   Dessie Coma, M.S. OTR/L  06/19/21, 3:31 PM  ascom [phone]

## 2021-06-19 NOTE — Progress Notes (Signed)
Progress Note   Patient: Jonathan Mcmillan OIB:[id number] DOB: Sep 20, 1995 DOA: 05/31/2021     19 DOS: the patient was seen and examined on 06/19/2021   Brief hospital course: 26 year old black male diagnosed with diabetes mellitus type 1 in 2012 Several prior episodes and admissions for DKA--significant history of noncompliance noted in the past (not taking insulin for years at a time) Several admissions for abscesses in 2018 2019 Tobacco, cannabinoid hyperemesis syndrome additionally Accelerated hypertension supposed to be on meds Last hospitalization 06/10/2020 through 06/13/2020-at that admission was recovering from [place] (05/22/2020)-initial creatinine was 5.5 and he recovered  Readmit 05/31/2021 3/7H/0 fever chills nonproductive cough myalgias-Tmax 102.6 and met sepsis criteria and lactic acid 2.1 BUN/creatinine up to 13.8 from baseline of 3.9 Platelet count 172 Urine analysis gross proteinuria CXR no acute cardiopulmonary disease   Procedures and Significant Results:  ACLS 2/2 asystole/PEA arrest 2/2 metabolic derangements [id number]/45/0388 06/01/2021 intubation, placement central line, A-line, trialysis catheter, CRRT initiated 06/03/2021 extubated, reintubated 06/04/2021 due to large volume aspiration, worsening shock. 2/2 Placement of a 19 cm tip to cuff tunneled hemodialysis catheter via the right internal jugular vein Received several platelet transfusions for severe thrombocytopenia felt secondary to severe sepsis Transferred to hospitalist service 06/13/2021-placed on dysphagia diet postextubation    Consultants:  PCCM Nephrology - AKI--declared ESRD 06/14/2021 Cardiology  -cardiac arrest, HFrEF-35-40% global hypokinesis hematology -thrombocytopenia Neurology -anoxic brain injury status postcardiac arrest Vascular surgeon -hemodialysis catheter   Assessment and Plan:  Renal\-AKI progressing to ESRD previously on CRRT now on IHD Continue calcitriol 0.5, add Sensipar 30 mg q. breakfast,  add calcium carbonate 500 3 times daily Obtain PTH as per renal Appears patient is clipped [facility] [facility] Monday Wednesday Friday at 10:45 AM Await care coordination CIR ID Septic shock (HCC)-resolved as of 06/18/2021 Aspiration pneumonia ([place]) Secondary to aspiration pneumonia, treated with antibiotics, now resolved. Pressure injury of buttock, stage 2 ([place])- (present on admission) Lower extremity decubitus ulcers Management per wound care--patient also seems to have wounds on the lateral aspect of the left foot with scaling and desquamation of the skin-I will ask wound nurse to assess prior to discharge to CIR-dressings q. other day wet-to-dry/Aqu[place]hor Heme Anemia of critical illness with sepsis + anemia of renal disease anemia-resolved as of 2/5/2023Hemoglobin stable status post blood products.   obtain anemia panel 2/7 with next lab draws-May require IV iron versus EPO--can continue p.o. FeSO4 325 daily, zinc, ascorbic acid, thiamine Thrombocytopenia (HCC) MAHA, ITP, dITP ruled out-schistocytes are secondary to CRRT causing flow issues/damage to platelets Thorough evaluation per hematology, conclusion: Sepsis as cause.   SCDs only do not use heparin Cardiology Chronic combined systolic and diastolic CHF (congestive heart failure) ([place]) Cardiopulmonary arrest (HCC)-resolved as of 06/18/2021 PEA arrest probably from severe metabolic abnormalities. Paroxysmal atrial fibrillation (HCC) CHADS2 score about 2--no anticoagulation because of moderate to severe thrombocytopenia New diagnosis this admission.  Cardiology felt to be nonischemic based on age, no further evaluation was suggested Current meds = amiodarone 200 daily, Coreg 3.125 Volume managed by dialysis Check periodic LFT/TSH Type 1 diabetes mellitus with complication (HCC) CBG stable, continue sliding scale insulin and long-acting insulin. GI Patient has Flexi-Seal in place with loose stool-this has been in place since  1/19--discontinue Colace Continue pantoprazole 40 daily for stress prophylaxis Stool GI pathogen panel was performed 1/18 and C. difficile was performed and both were negative Doubt infectious, obtain fecal lactoferrin, start Imodium 2 mg twice daily scheduled-hopeful to remove Flexi-Seal later today Skin Decubitus ulcer, multiple  wounds -Santyl to sacral wound, Eucerin to peeling skin-does need wound care to address lower extremities Continue      Subjective:  -Flat affect seems pretty frustrated and quite angry at his situation-asks me when his Flexi-Seal can come out-it has been in since 06/01/2021  Physical Exam: Vitals:   06/19/21 1502 06/19/21 1504 06/19/21 1505 06/19/21 1515  BP:  113/82 113/82 112/73  Pulse:  78 78 78  Resp:  11 (!) 7 11  Temp: (!) 97.5 F (36.4 C)     TempSrc:      SpO2:  98% 99% 99%  Weight:      Height:       Awake coherent, monosyllabic initially but then becomes a little bit more animated-seems quite angry at the situation Coarsened features and lethargy like skin noted over arms face EOMI NCAT no icterus no pallor Cannot appreciate any thyromegaly lymphadenopathy Chest is clear no rales no rhonchi S1-S2 no murmur no rub no gallop Abdomen is scaphoid He has a wound on the left lateral aspect of the dorsal surface of his left foot-I did not examine the right because we did not have the right dressings on the HD unit   Data Reviewed:  Phosphorus slightly elevated 5.2 calcium low 6.7 Hemoglobin 7.9 normocytic platelets are 70  Family Communication: none  Disposition: Status is: Inpatient  Remains inpatient appropriate because: new ESRD, malfunctioning catheter, consideration for CIR    Planned Discharge Destination:  CIR    Time spent: 65 minutes  Author: Nita Sells, MD 06/19/2021 3:22 PM  For on call review [email or web address].

## 2021-06-19 NOTE — Progress Notes (Addendum)
Patient clinically accepted at [facility] for incenter Hemodialysis, chair time is MWF 10:45am. Insurance verification is still pending due to patient not having insurance.

## 2021-06-19 NOTE — Progress Notes (Signed)
[facility]  ROUNDING NOTE   Subjective:   Patient seen resting quietly after scheduled for PermCath exchange Patient kept n.p.o. for procedure Denies shortness of breath Tolerating meals without complaint  Plan for dialysis later today  Objective:  Vital signs in last 24 hours:  Temp:  [95 F (35 C)-98.6 F (37 C)] 98.1 F (36.7 C) (02/06 1245) Pulse Rate:  [68-93] 69 (02/06 1245) Resp:  [10-18] 14 (02/06 1214) BP: (114-143)/(66-97) 124/78 (02/06 1245) SpO2:  [98 %-100 %] 100 % (02/06 1245) Weight:  [66.9 kg] 66.9 kg (02/06 1036)  Weight change:  Filed Weights   06/14/21 1218 06/17/21 0500 06/19/21 1036  Weight: 64.7 kg 66.9 kg 66.9 kg    Intake/Output: I/O last 3 completed shifts: In: 200 [P.O.:200] Out: 2000 [Stool:2000]   Intake/Output this shift:  Total I/O In: 60 [P.O.:60] Out: -   Physical Exam: General: No acute distress  Eyes: Anicteric  Lungs:  Diminished in bases normal effort  Heart: S1S2, no rubs  Abdomen:  Soft, nontender, BS present  Extremities: No peripheral edema.  Neurologic: Awake, alert, no rbus  Skin: Peeling skin noted  Access:  Right femoral temp cath, Rt permcath placed on [place] on 06/15/21  Rectal tube, external catheter   Basic Metabolic Panel: Recent Labs  Lab 06/13/21 0404 06/14/21 0346 06/15/21 0402 06/16/21 0543 06/17/21 0613 06/19/21 0905  NA 133* 130* 136 134* 135 138  K 3.6 4.3 4.3 4.9 4.4 4.8  CL 100 98 105 104 104 106  CO2 _0 GLUCOSE 50* 267* 146* 207* 172* 98  BUN 56* 68* 51* 60* 38* 50*  CREATININE 2.81* 3.54* 3.33* 4.31* 3.43* 5.03*  CALCIUM 6.6* 6.3* 6.5* 6.3* 6.6* 6.7*  MG 2.4 2.6*  --  2.3  --   --   PHOS 3.7 4.3  --  4.5 3.8 5.2*     Liver Function Tests: Recent Labs  Lab 06/13/21 0404 06/14/21 0346 06/16/21 0543 06/17/21 0613 06/19/21 0905  ALBUMIN 1.8* 1.7* 1.6* 1.7* 1.8*    No results for input(s): LIPASE, AMYLASE in the last 168 hours.  No results for  input(s): AMMONIA in the last 168 hours.   CBC: Recent Labs  Lab 06/14/21 0346 06/15/21 0402 06/16/21 0509 06/17/21 0613 06/19/21 0905  WBC 9.3 8.2 7.0 6.3 9.2  HGB 9.2* 8.8* 8.1* 8.1* 7.9*  HCT 26.9* 26.2* 24.4* 25.2* 25.0*  MCV 87.3 87.3 88.7 91.6 92.6  PLT 50* 58* 63* 58* 70*     Cardiac Enzymes: No results for input(s): CKTOTAL, CKMB, CKMBINDEX, TROPONINI in the last 168 hours.   BNP: Invalid input(s): POCBNP  CBG: Recent Labs  Lab 06/19/21 0753 06/19/21 1009 06/19/21 1139 06/19/21 1207 06/19/21 1253  GLUCAP 103* 87 70 69* 126*     Microbiology: Results for orders placed or performed during the hospital encounter of 05/31/21  Resp Panel by RT-PCR (Flu A&B, Covid) Nasopharyngeal Swab     Status: None   Collection Time: 05/31/21  6:58 AM   Specimen: Nasopharyngeal Swab; Nasopharyngeal(NP) swabs in vial transport medium  Result Value Ref Range Status   SARS Coronavirus 2 by RT PCR NEGATIVE NEGATIVE Final    Comment: (NOTE) SARS-CoV-2 target nucleic acids are NOT DETECTED.  The SARS-CoV-2 RNA is generally detectable in upper respiratory specimens during the acute phase of infection. The lowest concentration of SARS-CoV-2 viral copies this assay can detect is 138 copies/mL. A negative result does not preclude SARS-Cov-2 infection and should  not be used as the sole basis for treatment or other patient management decisions. A negative result may occur with  improper specimen collection/handling, submission of specimen other than nasopharyngeal swab, presence of viral mutation(s) within the areas targeted by this assay, and inadequate number of viral copies(<138 copies/mL). A negative result must be combined with clinical observations, patient history, and epidemiological information. The expected result is Negative.  Fact Sheet for Patients:  EntrepreneurPulse.com.au  Fact Sheet for Healthcare Providers:   IncredibleEmployment.be  This test is no t yet approved or cleared by the [place] FDA and  has been authorized for detection and/or diagnosis of SARS-CoV-2 by FDA under an Emergency Use Authorization (EUA). This EUA will remain  in effect (meaning this test can be used) for the duration of the COVID-19 declaration under Section 564(b)(1) of the Act, 21 U.S.C.section 360bbb-3(b)(1), unless the authorization is terminated  or revoked sooner.       Influenza A by PCR NEGATIVE NEGATIVE Final   Influenza B by PCR NEGATIVE NEGATIVE Final    Comment: (NOTE) The Xpert Xpress SARS-CoV-2/FLU/RSV plus assay is intended as an aid in the diagnosis of influenza from Nasopharyngeal swab specimens and should not be used as a sole basis for treatment. Nasal washings and aspirates are unacceptable for Xpert Xpress SARS-CoV-2/FLU/RSV testing.  Fact Sheet for Patients: EntrepreneurPulse.com.au  Fact Sheet for Healthcare Providers: IncredibleEmployment.be  This test is not yet approved or cleared by the [place] FDA and has been authorized for detection and/or diagnosis of SARS-CoV-2 by FDA under an Emergency Use Authorization (EUA). This EUA will remain in effect (meaning this test can be used) for the duration of the COVID-19 declaration under Section 564(b)(1) of the Act, 21 U.S.C. section 360bbb-3(b)(1), unless the authorization is terminated or revoked.  Performed at [facility], [place], [place] [address]   Respiratory (~20 pathogens) panel by PCR     Status: None   Collection Time: 05/31/21  6:58 AM   Specimen: Nasopharyngeal Swab; Respiratory  Result Value Ref Range Status   Adenovirus NOT DETECTED NOT DETECTED Final   Coronavirus 229E NOT DETECTED NOT DETECTED Final    Comment: (NOTE) The Coronavirus on the Respiratory Panel, DOES NOT test for the novel  Coronavirus (2019 nCoV)     Coronavirus HKU1 NOT DETECTED NOT DETECTED Final   Coronavirus NL63 NOT DETECTED NOT DETECTED Final   Coronavirus OC43 NOT DETECTED NOT DETECTED Final   Metapneumovirus NOT DETECTED NOT DETECTED Final   Rhinovirus / Enterovirus NOT DETECTED NOT DETECTED Final   Influenza A NOT DETECTED NOT DETECTED Final   Influenza B NOT DETECTED NOT DETECTED Final   Parainfluenza Virus 1 NOT DETECTED NOT DETECTED Final   Parainfluenza Virus 2 NOT DETECTED NOT DETECTED Final   Parainfluenza Virus 3 NOT DETECTED NOT DETECTED Final   Parainfluenza Virus 4 NOT DETECTED NOT DETECTED Final   Respiratory Syncytial Virus NOT DETECTED NOT DETECTED Final   Bordetella pertussis NOT DETECTED NOT DETECTED Final   Bordetella Parapertussis NOT DETECTED NOT DETECTED Final   Chlamydophila pneumoniae NOT DETECTED NOT DETECTED Final   Mycoplasma pneumoniae NOT DETECTED NOT DETECTED Final    Comment: Performed at [facility], [place]. [address], [place], [place] [address]  Group A Strep by PCR [facility] Only)     Status: None   Collection Time: 05/31/21  8:35 AM   Specimen: Throat; Sterile Swab  Result Value Ref Range Status   Group A Strep by PCR  NOT DETECTED NOT DETECTED Final    Comment: Performed at [facility], [address] [place], [place] [address]  Blood culture (routine x 2)     Status: None   Collection Time: 05/31/21  8:39 AM   Specimen: BLOOD  Result Value Ref Range Status   Specimen Description BLOOD BLOOD RIGHT FOREARM  Final   Special Requests   Final    BOTTLES DRAWN AEROBIC AND ANAEROBIC Blood Culture results may not be optimal due to an excessive volume of blood received in culture bottles   Culture   Final    NO GROWTH 5 DAYS Performed at [facility], [address] [place], [place] [address]    Report Status 06/05/2021 FINAL  Final  Blood culture (routine x 2)     Status: None   Collection Time: 05/31/21  8:39 AM   Specimen: BLOOD  Result Value Ref Range Status    Specimen Description BLOOD RIGHT ANTECUBITAL  Final   Special Requests   Final    BOTTLES DRAWN AEROBIC AND ANAEROBIC Blood Culture results may not be optimal due to an excessive volume of blood received in culture bottles   Culture   Final    NO GROWTH 5 DAYS Performed at [facility], [address] [place], [place] [address]    Report Status 06/05/2021 FINAL  Final  C Difficile Quick Screen w PCR reflex     Status: None   Collection Time: 05/31/21  1:20 PM   Specimen: Stool  Result Value Ref Range Status   C Diff antigen NEGATIVE NEGATIVE Final   C Diff toxin NEGATIVE NEGATIVE Final   C Diff interpretation No C. difficile detected.  Final    Comment: Performed at [facility], [address] [place], [place] [address]  Gastrointestinal Panel by PCR , Stool     Status: None   Collection Time: 05/31/21  1:30 PM   Specimen: Stool  Result Value Ref Range Status   Campylobacter species NOT DETECTED NOT DETECTED Final   Plesimonas shigelloides NOT DETECTED NOT DETECTED Final   Salmonella species NOT DETECTED NOT DETECTED Final   Yersinia enterocolitica NOT DETECTED NOT DETECTED Final   Vibrio species NOT DETECTED NOT DETECTED Final   Vibrio cholerae NOT DETECTED NOT DETECTED Final   Enteroaggregative E coli (EAEC) NOT DETECTED NOT DETECTED Final   Enteropathogenic E coli (EPEC) NOT DETECTED NOT DETECTED Final   Enterotoxigenic E coli (ETEC) NOT DETECTED NOT DETECTED Final   Shiga like toxin producing E coli (STEC) NOT DETECTED NOT DETECTED Final   Shigella/Enteroinvasive E coli (EIEC) NOT DETECTED NOT DETECTED Final   Cryptosporidium NOT DETECTED NOT DETECTED Final   Cyclospora cayetanensis NOT DETECTED NOT DETECTED Final   Entamoeba histolytica NOT DETECTED NOT DETECTED Final   Giardia lamblia NOT DETECTED NOT DETECTED Final   Adenovirus F40/41 NOT DETECTED NOT DETECTED Final   Astrovirus NOT DETECTED NOT DETECTED Final   Norovirus GI/GII NOT DETECTED NOT  DETECTED Final   Rotavirus A NOT DETECTED NOT DETECTED Final   Sapovirus (I, II, IV, and V) NOT DETECTED NOT DETECTED Final    Comment: Performed at [facility], [address] [place], [place] [address]  Urine Culture     Status: None   Collection Time: 06/01/21 10:23 AM   Specimen: In/Out Cath Urine  Result Value Ref Range Status   Specimen Description   Final    IN/OUT CATH URINE Performed at [facility], [address], [place], [place] [address]    Special Requests   Final    NONE Performed at [facility], [address], [place], [place] [address]    Culture   Final    NO GROWTH Performed at [facility], [place] [address], [place], [place] [address]    Report Status 06/03/2021 FINAL  Final  Culture, Respiratory w Gram Stain     Status: None   Collection Time: 06/01/21  3:59 PM   Specimen: Tracheal Aspirate; Respiratory  Result Value Ref Range Status   Specimen Description   Final    TRACHEAL ASPIRATE Performed at [facility], [address], [place], [place] [address]    Special Requests   Final    NONE Performed at [facility] [place], [place] [address]    Gram Stain   Final    RARE SQUAMOUS EPITHELIAL CELLS PRESENT MODERATE WBC PRESENT,BOTH PMN AND MONONUCLEAR MODERATE GRAM POSITIVE COCCI FEW GRAM NEGATIVE RODS FEW GRAM POSITIVE RODS RARE YEAST    Culture   Final    FEW Consistent with normal respiratory flora. No Pseudomonas species isolated Performed at [facility] [address] [place], [place] [address]    Report Status 06/03/2021 FINAL  Final  CULTURE, BLOOD (ROUTINE X 2) w Reflex to ID Panel     Status: None   Collection Time: 06/01/21  7:06 PM   Specimen: BLOOD  Result Value Ref Range Status   Specimen Description BLOOD RIGHT ANTECUBITAL  Final   Special Requests   Final    BOTTLES DRAWN AEROBIC ONLY Blood Culture results may not be optimal due to an inadequate  volume of blood received in culture bottles   Culture   Final    NO GROWTH 5 DAYS Performed at [facility], [address] [place], [place] [address]    Report Status 06/06/2021 FINAL  Final  CULTURE, BLOOD (ROUTINE X 2) w Reflex to ID Panel     Status: None   Collection Time: 06/01/21  7:37 PM   Specimen: BLOOD  Result Value Ref Range Status   Specimen Description BLOOD [facility]  Final   Special Requests BOTTLES DRAWN AEROBIC AND ANAEROBIC BCLV  Final   Culture   Final    NO GROWTH 5 DAYS Performed at [facility] [address] [place], [place] [address]    Report Status 06/06/2021 FINAL  Final  MRSA Next Gen by PCR, Nasal     Status: None   Collection Time: 06/02/21 10:46 AM   Specimen: Nasal Mucosa; Nasal Swab  Result Value Ref Range Status   MRSA by PCR Next Gen NOT DETECTED NOT DETECTED Final    Comment: (NOTE) The GeneXpert MRSA Assay (FDA approved for NASAL specimens only), is one component of a comprehensive MRSA colonization surveillance program. It is not intended to diagnose MRSA infection nor to guide or monitor treatment for MRSA infections. Test performance is not FDA approved in patients less than 6 years old. Performed at [facility], [address] [place], [place] [address]   Culture, Respiratory w Gram Stain     Status: None   Collection Time: 06/04/21  7:38 AM   Specimen: Tracheal Aspirate; Respiratory  Result Value Ref Range Status   Specimen Description   Final    TRACHEAL ASPIRATE Performed at [facility], [address] [place], [place] [address]    Special Requests   Final  NONE Performed at [facility], [address] [place], [place] [address]    Gram Stain   Final    MODERATE WBC PRESENT,BOTH PMN AND MONONUCLEAR NO ORGANISMS SEEN Performed at [facility], [place] [address] [place], [place] [address]    Culture FEW CANDIDA ALBICANS RARE ESCHERICHIA COLI   Final   Report Status  06/07/2021 FINAL  Final   Organism ID, Bacteria ESCHERICHIA COLI  Final      Susceptibility   Escherichia coli - MIC*    AMPICILLIN >=32 RESISTANT Resistant     CEFAZOLIN <=4 SENSITIVE Sensitive     CEFEPIME <=0.12 SENSITIVE Sensitive     CEFTAZIDIME <=1 SENSITIVE Sensitive     CEFTRIAXONE <=0.25 SENSITIVE Sensitive     CIPROFLOXACIN <=0.25 SENSITIVE Sensitive     GENTAMICIN <=1 SENSITIVE Sensitive     IMIPENEM <=0.25 SENSITIVE Sensitive     TRIMETH/SULFA >=320 RESISTANT Resistant     AMPICILLIN/SULBACTAM >=32 RESISTANT Resistant     PIP/TAZO <=4 SENSITIVE Sensitive     * RARE ESCHERICHIA COLI  CULTURE, BLOOD (ROUTINE X 2) w Reflex to ID Panel     Status: None   Collection Time: 06/04/21  6:35 PM   Specimen: BLOOD  Result Value Ref Range Status   Specimen Description BLOOD BLOOD LEFT HAND  Final   Special Requests   Final    BOTTLES DRAWN AEROBIC AND ANAEROBIC Blood Culture adequate volume   Culture   Final    NO GROWTH 5 DAYS Performed at [facility], [address], [place], [place] [address]    Report Status 06/09/2021 FINAL  Final    Coagulation Studies: No results for input(s): LABPROT, INR in the last 72 hours.   Urinalysis: No results for input(s): COLORURINE, LABSPEC, PHURINE, GLUCOSEU, HGBUR, BILIRUBINUR, KETONESUR, PROTEINUR, UROBILINOGEN, NITRITE, LEUKOCYTESUR in the last 72 hours.  Invalid input(s): APPERANCEUR     Imaging: PERIPHERAL VASCULAR CATHETERIZATION  Result Date: 06/19/2021 See surgical note for result.    Medications:    sodium chloride Stopped (06/12/21 2313)    amiodarone  200 mg Oral Daily   vitamin C  500 mg Oral BID   calcitRIOL  0.5 mcg Oral Daily   carvedilol  3.125 mg Oral BID WC   chlorhexidine gluconate (MEDLINE KIT)  15 mL Mouth Rinse BID   Chlorhexidine Gluconate Cloth  6 each Topical Daily   Chlorhexidine Gluconate Cloth  6 each Topical Q0600   collagenase   Topical Daily   feeding supplement (NEPRO CARB STEADY)   237 mL Oral BID BM   ferrous sulfate  325 mg Oral Q breakfast   hydrocerin   Topical BID   insulin aspart  0-15 Units Subcutaneous TID WC   insulin glargine-yfgn  5 Units Subcutaneous QHS   mouth rinse  15 mL Mouth Rinse BID   midazolam       multivitamin  1 tablet Oral QHS   pantoprazole  40 mg Oral Daily   sodium chloride flush  10-40 mL Intracatheter Q12H   thiamine  100 mg Oral Daily   zinc sulfate  220 mg Oral Daily   sodium chloride, docusate, fentaNYL (SUBLIMAZE) injection, guaiFENesin-dextromethorphan, HYDROmorphone (DILAUDID) injection, ipratropium-albuterol, lip balm, ondansetron (ZOFRAN) IV, ondansetron **OR** [DISCONTINUED] ondansetron (ZOFRAN) IV, oxyCODONE, polyvinyl alcohol, sodium chloride flush  Assessment/ Plan:  [name] is a 26 y.o. black male with hypertension, insulin dependent diabetes mellitus type I, diabetic gastroparesis, diabetic neuropathy, tobacco use, THC use, right toe  amputation, perirectal abscess, who is admitted to [facility] on 05/31/2021 for SIRS (systemic inflammatory response syndrome) (HCC) [R65.10] AKI (acute kidney injury) ([place]) [N17.9] Symptomatic anemia [D64.9] Acute renal failure superimposed on stage 5 chronic kidney disease, not on chronic dialysis, unspecified acute renal failure type ([place]) [N17.9, N18.5] Hypertension, unspecified type [I10] Acute cough [R05.1]  Cardiac arrest with code blue 06/01/2021 at 10 am. Transferred to ICU. Intubated  End-stage renal disease requiring hemodialysis. Based on patient's history of nephrotic range proteinuria and hematuria and lack of sustainable renal recovery, we feel patient has progressed to end-stage renal disease. Strong family history of dialysis with mother with ESRD before passing. Was on CRRT.  -Appreciate vascular exchanging PermCath today.  Plan to dialyze patient this afternoon.  If successful, will place order to discontinue temp cath.  Next dialysis treatment scheduled for Wednesday. -  Appreciate dialysis coordinator confirming outpatient chair time at [facility] on a MWF schedule.  Currently awaiting insurance approval.    Acute respiratory failure Extubated then re-intubated 06/03/2021; extubated 06/08/2021 -Room air  Hypotension with cardiogenic shock -Blood pressure 124/78 -Prescribed amiodarone  4. Insulin dependent Diabetes type 1 with CKD and proteinuria   Lab Results  Component Value Date   HGBA1C 5.7 (H) 05/31/2021  Glucose stable  5. Thrombocytopenia Hematology evaluation on 1/22 Suspected peripheral destruction Platelets currently at 70,000    LOS: [place] 2/6/20232:12 PM

## 2021-06-19 NOTE — Progress Notes (Signed)
Drs. Isaias Cowman and Dew both by to see pt. Post exchange of perm cath. BG:70 now. Dr. Lucky Cowboy says "just let him eat now." Pt. Given apple juice and Nab p-nut butter crackers.

## 2021-06-20 DIAGNOSIS — R197 Diarrhea, unspecified: Secondary | ICD-10-CM

## 2021-06-20 LAB — GLUCOSE, CAPILLARY
Glucose-Capillary: 104 mg/dL — ABNORMAL HIGH (ref 70–99)
Glucose-Capillary: 112 mg/dL — ABNORMAL HIGH (ref 70–99)
Glucose-Capillary: 223 mg/dL — ABNORMAL HIGH (ref 70–99)
Glucose-Capillary: 258 mg/dL — ABNORMAL HIGH (ref 70–99)
Glucose-Capillary: 52 mg/dL — ABNORMAL LOW (ref 70–99)
Glucose-Capillary: 62 mg/dL — ABNORMAL LOW (ref 70–99)

## 2021-06-20 LAB — CBC
HCT: 23 % — ABNORMAL LOW (ref 39.0–52.0)
Hemoglobin: 7.4 g/dL — ABNORMAL LOW (ref 13.0–17.0)
MCH: 30 pg (ref 26.0–34.0)
MCHC: 32.2 g/dL (ref 30.0–36.0)
MCV: 93.1 fL (ref 80.0–100.0)
Platelets: 70 10*3/uL — ABNORMAL LOW (ref 150–400)
RBC: 2.47 MIL/uL — ABNORMAL LOW (ref 4.22–5.81)
RDW: 15.1 % (ref 11.5–15.5)
WBC: 10.1 10*3/uL (ref 4.0–10.5)
nRBC: 0 % (ref 0.0–0.2)

## 2021-06-20 LAB — RENAL FUNCTION PANEL
Albumin: 1.6 g/dL — ABNORMAL LOW (ref 3.5–5.0)
Anion gap: 7 (ref 5–15)
BUN: 30 mg/dL — ABNORMAL HIGH (ref 6–20)
CO2: 25 mmol/L (ref 22–32)
Calcium: 6.9 mg/dL — ABNORMAL LOW (ref 8.9–10.3)
Chloride: 104 mmol/L (ref 98–111)
Creatinine, Ser: 3.5 mg/dL — ABNORMAL HIGH (ref 0.61–1.24)
GFR, Estimated: 24 mL/min — ABNORMAL LOW (ref >60)
Glucose, Bld: 90 mg/dL (ref 70–99)
Phosphorus: 4.4 mg/dL (ref 2.5–4.6)
Potassium: 4.5 mmol/L (ref 3.5–5.1)
Sodium: 136 mmol/L (ref 135–145)

## 2021-06-20 LAB — HEPATIC FUNCTION PANEL
ALT: 5 U/L (ref 0–44)
AST: 14 U/L — ABNORMAL LOW (ref 15–41)
Albumin: 1.6 g/dL — ABNORMAL LOW (ref 3.5–5.0)
Alkaline Phosphatase: 133 U/L — ABNORMAL HIGH (ref 38–126)
Bilirubin, Direct: 0.3 mg/dL — ABNORMAL HIGH (ref 0.0–0.2)
Indirect Bilirubin: 0.6 mg/dL (ref 0.3–0.9)
Total Bilirubin: 0.9 mg/dL (ref 0.3–1.2)
Total Protein: 4.9 g/dL — ABNORMAL LOW (ref 6.5–8.1)

## 2021-06-20 LAB — VITAMIN B12: Vitamin B-12: 2065 pg/mL — ABNORMAL HIGH (ref 180–914)

## 2021-06-20 LAB — LACTOFERRIN, FECAL, QUALITATIVE: Lactoferrin, Fecal, Qual: POSITIVE — AB

## 2021-06-20 LAB — C DIFFICILE (CDIFF) QUICK SCRN (NO PCR REFLEX)
C Diff antigen: NEGATIVE
C Diff interpretation: NOT DETECTED
C Diff toxin: NEGATIVE

## 2021-06-20 MED ORDER — PANCRELIPASE (LIP-PROT-AMYL) 36000-114000 UNITS PO CPEP
36000.0000 [IU] | ORAL_CAPSULE | Freq: Three times a day (TID) | ORAL | Status: DC
  Administered 2021-06-21 – 2021-06-22 (×4): 36000 [IU] via ORAL
  Filled 2021-06-20 (×6): qty 1

## 2021-06-20 MED ORDER — SACUBITRIL-VALSARTAN 24-26 MG PO TABS: 1.0000 | ORAL_TABLET | Freq: Two times a day (BID) | ORAL | Status: DC

## 2021-06-20 MED ORDER — CARVEDILOL 3.125 MG PO TABS: 3.1250 mg | ORAL_TABLET | Freq: Two times a day (BID) | ORAL | Status: DC

## 2021-06-20 MED ORDER — SACUBITRIL-VALSARTAN 24-26 MG PO TABS
1.0000 | ORAL_TABLET | Freq: Two times a day (BID) | ORAL | Status: DC
  Administered 2021-06-20 – 2021-06-22 (×4): 1 via ORAL
  Filled 2021-06-20 (×5): qty 1

## 2021-06-20 MED ORDER — LOPERAMIDE HCL 2 MG PO CAPS
2.0000 mg | ORAL_CAPSULE | Freq: Two times a day (BID) | ORAL | Status: DC
  Administered 2021-06-20 – 2021-06-22 (×3): 2 mg via ORAL
  Filled 2021-06-20 (×3): qty 1

## 2021-06-20 MED ORDER — RENA-VITE PO TABS: 1.0000 | ORAL_TABLET | Freq: Every day | ORAL | 0 refills | Status: DC

## 2021-06-20 MED ORDER — AMIODARONE HCL 200 MG PO TABS: 200.0000 mg | ORAL_TABLET | Freq: Every day | ORAL | Status: DC

## 2021-06-20 MED ORDER — CALCIUM CARBONATE ANTACID 500 MG PO CHEW: 1.0000 | CHEWABLE_TABLET | Freq: Three times a day (TID) | ORAL | Status: DC

## 2021-06-20 MED ORDER — HYDROCERIN EX CREA: 1.0000 "application " | TOPICAL_CREAM | Freq: Two times a day (BID) | CUTANEOUS | 0 refills | Status: DC

## 2021-06-20 MED ORDER — FERROUS SULFATE 325 (65 FE) MG PO TABS: 325.0000 mg | ORAL_TABLET | Freq: Every day | ORAL | 3 refills | Status: DC

## 2021-06-20 MED ORDER — COLLAGENASE 250 UNIT/GM EX OINT: TOPICAL_OINTMENT | Freq: Every day | CUTANEOUS | 0 refills | Status: DC

## 2021-06-20 MED ORDER — INSULIN ASPART 100 UNIT/ML IJ SOLN: 0.0000 [IU] | Freq: Three times a day (TID) | INTRAMUSCULAR | 11 refills | Status: DC

## 2021-06-20 MED ORDER — DEXTROSE 50 % IV SOLN
12.5000 g | INTRAVENOUS | Status: AC
  Administered 2021-06-20: 12.5 g via INTRAVENOUS
  Filled 2021-06-20: qty 50

## 2021-06-20 MED ORDER — LOPERAMIDE HCL 2 MG PO CAPS
2.0000 mg | ORAL_CAPSULE | Freq: Three times a day (TID) | ORAL | Status: DC
Start: 2021-06-20 — End: 2021-06-20

## 2021-06-20 MED ORDER — POLYETHYLENE GLYCOL 3350 17 GM/SCOOP PO POWD
1.0000 | Freq: Once | ORAL | Status: AC
  Administered 2021-06-20: 255 g via ORAL
  Filled 2021-06-20: qty 255

## 2021-06-20 MED ORDER — LOPERAMIDE HCL 2 MG PO CAPS: 2.0000 mg | ORAL_CAPSULE | Freq: Two times a day (BID) | ORAL | 0 refills | Status: DC

## 2021-06-20 MED ORDER — CALCITRIOL 0.5 MCG PO CAPS: 0.5000 ug | ORAL_CAPSULE | Freq: Every day | ORAL | Status: DC

## 2021-06-20 MED ORDER — OXYCODONE HCL 5 MG PO TABS: 5.0000 mg | ORAL_TABLET | ORAL | 0 refills | Status: DC | PRN

## 2021-06-20 MED ORDER — SODIUM CHLORIDE 0.9 % IV SOLN: INTRAVENOUS | Status: DC

## 2021-06-20 MED ORDER — CINACALCET HCL 30 MG PO TABS: 30.0000 mg | ORAL_TABLET | Freq: Every day | ORAL | Status: DC

## 2021-06-20 NOTE — Progress Notes (Signed)
Patient seen and examined today he is in his room He is somewhat withdrawn but does open up somewhat when I examine his feet Wound nurse is to come by and please give recommendations when they are available given his right heel specifically I had a long and detailed discussion with both Dr. Marius Ditch of GI because of a fecal lactoferrin being positive-the debate is whether this is C. difficile-I appreciate her input and Dr. Gwenevere Ghazi input and we will get a C. difficile test-if this is negative patient probably can follow-up in the outpatient for an outpatient evaluation of his colon The differential remains broad in terms of why he has diarrhea but he has had it since 1/18 and I do not think it is life-threatening at this juncture  I do think he can go to CIR tomorrow if C. difficile is ruled out and I have done his discharge summary  No charge  Verneita Griffes, MD [facility] 6:35 PM

## 2021-06-20 NOTE — H&P (Signed)
Physical Medicine and Rehabilitation Admission H&P    Chief Complaint  Patient presents with   Generalized Body Aches   Chills  Debility due to renal failure, heart failure, status post PEA arrest  HPI: Jonathan Mcmillan is a 26 year old male who presented to the [facility] emergency department on 05/31/2021 with fever, chills and vomiting.  Initial work-up revealed profound anemia and significantly elevated serum creatinine above baseline level.  Respiratory viral panels including COVID and influenza were negative.  He was admitted for further evaluation and work-up.  Nephrology consultation obtained.  The patient's past medical history includes chronic kidney disease stage IV, hypertension, diabetes mellitus type 1, gastroesophageal reflux disease.  Primary care obtained at [facility].  Followed by Dr. Loney Loh nephrology last seen 08/01/2020.  On 06/01/2021, the patient collapsed while toileting and rapid response was called.  He lost his pulse and CODE BLUE was called.  Was initially asystolic but progressed to PEA.  ACLS protocol carried out and patient was intubated.  Regained pulse and transferred to the ICU.  Critical care management consulted.  Cardiology consultation obtained.  A left IJ temporary dialysis catheter was placed and seen RT initiated.  Concern for aspiration pneumonia, antibiotics started.  The echo revealed ejection fraction of 35 to 40%.  On 1/22, worsening thrombocytopenia noted and hematology consulted.  Platelet infusion given.  He was noted to be in atrial fibrillation with rapid ventricular response on 1/23.  On 1/25, palliative care consultation obtained.  Patient was extubated on 1/26.  Temporary dialysis catheter malfunctioned and a right femoral temporary catheter was placed on 1/28.  CRRT was discontinued and intermittent hemodialysis started on 1/30.  A vascular consultation was obtained and a right IJ tunneled dialysis  catheter was placed by Dr. Lucky Cowboy on 2/6.  Patient was noted to have developed a stage II sacral and heel pressure injuries.  [name] was started for low ejection fraction on 2/7.  Speech therapy evaluation carried out and the patient's diet was slowly advanced from dysphagia 1 to regular renal diet/carb modified with fluid restriction of 1200 mL.  Gastroenterology consultation obtained on 2/7 secondary to ongoing nonbloody diarrhea with stool lactoferrin positivity.  He underwent colonoscopy on 2/8 with poor prep.  Biopsies of normal mucosa of the entire colon were obtained.  Patient will need to follow-up as outpatient for pathology report and further recommendations.  Pt reports pain was 6-6.5/10- but better now that's gotten pain meds.  Likes fentanyl better than oxycodone. - better pain control.  LBM last night Peeing ~2x/day-   Review of Systems  Constitutional:  Positive for chills and fever.  Eyes:  Positive for blurred vision.       Since being in ICU. Does not wear glasses or contacts  Respiratory:  Negative for cough and shortness of breath.   Cardiovascular:  Negative for chest pain.  Gastrointestinal:        Last BM last night. (On scheduled loperamide for diarrhea)  Musculoskeletal:  Positive for back pain.       Pain over sacrum  Neurological:  Positive for dizziness. Negative for headaches.       A bit light headed when standing  All other systems reviewed and are negative. Past Medical History:  Diagnosis Date   Cannabinoid hyperemesis syndrome    CKD (chronic kidney disease), stage IV ([place])    Diabetes 1.5, managed as type 1 ([place])    DKA (diabetic ketoacidoses) 05/21/2016  Gastroparesis    HTN (hypertension)    Nicotine dependence    Perirectal abscess 06/16/2017   Right arm cellulitis 01/26/2018   Past Surgical History:  Procedure Laterality Date   DIALYSIS/PERMA CATHETER INSERTION N/A 06/15/2021   Procedure: DIALYSIS/PERMA CATHETER INSERTION;  Surgeon: Algernon Huxley,  MD;  Location: [facility];  Service: Cardiovascular;  Laterality: N/A;   DIALYSIS/PERMA CATHETER INSERTION N/A 06/19/2021   Procedure: DIALYSIS/PERMA CATHETER INSERTION;  Surgeon: Algernon Huxley, MD;  Location: [facility] [facility];  Service: Cardiovascular;  Laterality: N/A;   INCISION AND DRAINAGE PERIRECTAL ABSCESS N/A 06/16/2017   Procedure: IRRIGATION AND DEBRIDEMENT PERIRECTAL ABSCESS;  Surgeon: Florene Glen, MD;  Location: [facility];  Service: General;  Laterality: N/A;   none     Family History  Problem Relation Age of Onset   Diabetes Mother    Social History:  reports that he has been smoking cigarettes. He has been smoking an average of .5 packs per day. He has never used smokeless tobacco. He reports current drug use. Drug: Marijuana. He reports that he does not drink alcohol. Allergies: No Known Allergies Medications Prior to Admission  Medication Sig Dispense Refill   amLODipine (NORVASC) 10 MG tablet Take 1 tablet (10 mg total) by mouth daily. (Patient not taking: Reported on 06/01/2021) 10 tablet 0   hydrALAZINE (APRESOLINE) 50 MG tablet Take 1 tablet (50 mg total) by mouth 2 (two) times daily. (Patient not taking: Reported on 06/01/2021) 60 tablet 2   insulin glargine (LANTUS) 100 UNIT/ML injection Inject 20 Units into the skin at bedtime. (Patient not taking: Reported on 06/01/2021)     omeprazole (PRILOSEC) 40 MG capsule Take 1 capsule (40 mg total) by mouth daily. 30 capsule 1      Home: Home Living Family/patient expects to be discharged to:: Private residence Living Arrangements: Other relatives (2 aunts) Available Help at Discharge: Family, Available PRN/intermittently Type of Home: House Home Access: Ramped entrance Entrance Stairs-Number of Steps: 6 Home Layout: One level Bathroom Shower/Tub: Tub only, Chiropodist: Handicapped height Bathroom Accessibility: Yes  Lives With: Family   Functional History: Prior Function Prior Level  of Function : Independent/Modified Independent, Driving, Working/employed  Functional Status:  Mobility: Bed Mobility Overal bed mobility: Needs Assistance Bed Mobility: Supine to Sit Rolling: Min guard Supine to sit: Min assist Sit to supine: Mod assist General bed mobility comments: assist for BLE and trunk control Transfers Overall transfer level: Needs assistance Equipment used: None Transfers: Bed to chair/wheelchair/BSC Bed to/from chair/wheelchair/BSC transfer type:: Stand pivot Stand pivot transfers: Min assist General transfer comment: Min A and mod verbal cues for sequencing from elevated EOB Ambulation/Gait General Gait Details: N/A, contraindicated    ADL: ADL Overall ADL's : Needs assistance/impaired General ADL Comments: MAX A don/doff B socks at bed level. MOD A bathing at bed level - pt completes UB with cues for thoroughness. MAX A x2 toileting at bed level - pt continues to be incontinent of stool - RN in to place rectal tube. MIN A don/doff gown at bed level. MOD A x2 for ADL t/f  Cognition: Cognition Overall Cognitive Status: Within Functional Limits for tasks assessed Orientation Level: Oriented X4 Cognition Arousal/Alertness: Awake/alert Behavior During Therapy: Flat affect Overall Cognitive Status: Within Functional Limits for tasks assessed General Comments: Pt is oriented x4, appropriate, but slow to respond  Physical Exam: Blood pressure 131/82, pulse 85, temperature 98.1 F (36.7 C), resp. rate 16, height 5' 7.99" (1.727 m),  weight 70.3 kg, SpO2 100 %. Physical Exam Vitals and nursing note reviewed. Exam conducted with a chaperone present.  Constitutional:      Comments: Not engaged/ poor interaction; laying in bed; young patient- nurse at bedside; NAD  HENT:     Head: Normocephalic and atraumatic.     Right Ear: External ear normal.     Left Ear: External ear normal.     Nose: Nose normal. No congestion.     Mouth/Throat:     Mouth: Mucous  membranes are dry.     Pharynx: Oropharynx is clear. No oropharyngeal exudate.  Eyes:     General:        Right eye: No discharge.        Left eye: No discharge.     Extraocular Movements: Extraocular movements intact.     Comments: Injected sclera B/L   Cardiovascular:     Rate and Rhythm: Normal rate and regular rhythm.     Heart sounds: Normal heart sounds. No murmur heard.   No gallop.  Pulmonary:     Effort: Pulmonary effort is normal. No respiratory distress.     Breath sounds: Normal breath sounds. No wheezing or rales.  Abdominal:     General: Abdomen is flat. Bowel sounds are normal. There is no distension.     Palpations: Abdomen is soft.     Tenderness: There is no abdominal tenderness.     Comments: Flank edema/skin doughy  Musculoskeletal:     Cervical back: Neck supple. No tenderness.     Comments: Ues 5-/5 B/L  LE's 4-/5 in HF, KE, KF DF and PF B/L    Skin:    General: Skin is warm and dry.     Comments: Very dry, scaly skin- esp on feet Stage II B/L buttocks with associated abraded skin L lateral foot- has unstageable with thick creamy eschar? Vs slough in wound bed; L medial foot- abraded/erythematous  Neurological:     General: No focal deficit present.     Mental Status: He is alert and oriented to person, place, and time.     Comments: Poor interaction with interviewer- - anoxia vs just disengaged Stokcing pattern absent sensation to light touch from knees down B/L   Psychiatric:     Comments: Extremely flat- almost masked facies      Results for orders placed or performed during the hospital encounter of 05/31/21 (from the past 48 hour(s))  Glucose, capillary     Status: Abnormal   Collection Time: 06/18/21 12:25 PM  Result Value Ref Range   Glucose-Capillary 258 (H) 70 - 99 mg/dL    Comment: Glucose reference range applies only to samples taken after fasting for at least 8 hours.  Glucose, capillary     Status: Abnormal   Collection Time:  06/18/21  4:46 PM  Result Value Ref Range   Glucose-Capillary 154 (H) 70 - 99 mg/dL    Comment: Glucose reference range applies only to samples taken after fasting for at least 8 hours.  Glucose, capillary     Status: Abnormal   Collection Time: 06/18/21  9:05 PM  Result Value Ref Range   Glucose-Capillary 172 (H) 70 - 99 mg/dL    Comment: Glucose reference range applies only to samples taken after fasting for at least 8 hours.  Glucose, capillary     Status: Abnormal   Collection Time: 06/19/21  7:53 AM  Result Value Ref Range   Glucose-Capillary 103 (H) 70 - 99  mg/dL    Comment: Glucose reference range applies only to samples taken after fasting for at least 8 hours.   Comment 1 Notify RN   Renal function panel     Status: Abnormal   Collection Time: 06/19/21  9:05 AM  Result Value Ref Range   Sodium 138 135 - 145 mmol/L   Potassium 4.8 3.5 - 5.1 mmol/L   Chloride 106 98 - 111 mmol/L   CO2 24 22 - 32 mmol/L   Glucose, Bld 98 70 - 99 mg/dL    Comment: Glucose reference range applies only to samples taken after fasting for at least 8 hours.   BUN 50 (H) 6 - 20 mg/dL   Creatinine, Ser 5.03 (H) 0.61 - 1.24 mg/dL   Calcium 6.7 (L) 8.9 - 10.3 mg/dL   Phosphorus 5.2 (H) 2.5 - 4.6 mg/dL   Albumin 1.8 (L) 3.5 - 5.0 g/dL   GFR, Estimated 15 (L) >60 mL/min    Comment: (NOTE) Calculated using the CKD-EPI Creatinine Equation (2021)    Anion gap 8 5 - 15    Comment: Performed at [facility], [address] [place], [place] [address]  CBC     Status: Abnormal   Collection Time: 06/19/21  9:05 AM  Result Value Ref Range   WBC 9.2 4.0 - 10.5 K/uL   RBC 2.70 (L) 4.22 - 5.81 MIL/uL   Hemoglobin 7.9 (L) 13.0 - 17.0 g/dL   HCT 25.0 (L) 39.0 - 52.0 %   MCV 92.6 80.0 - 100.0 fL   MCH 29.3 26.0 - 34.0 pg   MCHC 31.6 30.0 - 36.0 g/dL   RDW 15.1 11.5 - 15.5 %   Platelets 70 (L) 150 - 400 K/uL    Comment: Immature Platelet Fraction may be clinically indicated, consider ordering  this additional test [id number] PLATELET COUNT CONFIRMED BY SMEAR    nRBC 0.0 0.0 - 0.2 %    Comment: Performed at [facility], [address], [place], [place] [address]  Folate     Status: None   Collection Time: 06/19/21  9:05 AM  Result Value Ref Range   Folate 7.9 >5.9 ng/mL    Comment: Performed at [facility], [address] [place], [place] [address]  Iron and TIBC     Status: Abnormal   Collection Time: 06/19/21  9:05 AM  Result Value Ref Range   Iron 80 45 - 182 ug/dL   TIBC 167 (L) 250 - 450 ug/dL   Saturation Ratios 48 (H) 17.9 - 39.5 %   UIBC 87 ug/dL    Comment: Performed at [facility], [address] [place], [place] [address]  Ferritin     Status: Abnormal   Collection Time: 06/19/21  9:05 AM  Result Value Ref Range   Ferritin 478 (H) 24 - 336 ng/mL    Comment: Performed at [facility], [address] [place], [place] [address]  Reticulocytes     Status: Abnormal   Collection Time: 06/19/21  9:05 AM  Result Value Ref Range   Retic Ct Pct 1.7 0.4 - 3.1 %   RBC. 2.67 (L) 4.22 - 5.81 MIL/uL   Retic Count, Absolute 46.5 19.0 - 186.0 K/uL   Immature Retic Fract 3.8 2.3 - 15.9 %    Comment: Performed at [facility], [address] [place], [place] [address]  Glucose, capillary     Status: None   Collection Time: 06/19/21 10:09 AM  Result  Value Ref Range   Glucose-Capillary 87 70 - 99 mg/dL    Comment: Glucose reference range applies only to samples taken after fasting for at least 8 hours.  Glucose, capillary     Status: None   Collection Time: 06/19/21 11:39 AM  Result Value Ref Range   Glucose-Capillary 70 70 - 99 mg/dL    Comment: Glucose reference range applies only to samples taken after fasting for at least 8 hours.  Glucose, capillary     Status: Abnormal   Collection Time: 06/19/21 12:07 PM  Result Value Ref Range   Glucose-Capillary 69 (L) 70 - 99 mg/dL    Comment: Glucose reference range  applies only to samples taken after fasting for at least 8 hours.  Glucose, capillary     Status: Abnormal   Collection Time: 06/19/21 12:36 PM  Result Value Ref Range   Glucose-Capillary 107 (H) 70 - 99 mg/dL    Comment: Glucose reference range applies only to samples taken after fasting for at least 8 hours.  Glucose, capillary     Status: Abnormal   Collection Time: 06/19/21 12:53 PM  Result Value Ref Range   Glucose-Capillary 126 (H) 70 - 99 mg/dL    Comment: Glucose reference range applies only to samples taken after fasting for at least 8 hours.  Glucose, capillary     Status: Abnormal   Collection Time: 06/19/21  6:42 PM  Result Value Ref Range   Glucose-Capillary 144 (H) 70 - 99 mg/dL    Comment: Glucose reference range applies only to samples taken after fasting for at least 8 hours.   Comment 1 Notify RN   Glucose, capillary     Status: Abnormal   Collection Time: 06/19/21  9:24 PM  Result Value Ref Range   Glucose-Capillary 108 (H) 70 - 99 mg/dL    Comment: Glucose reference range applies only to samples taken after fasting for at least 8 hours.  Hepatic function panel     Status: Abnormal   Collection Time: 06/20/21  4:37 AM  Result Value Ref Range   Total Protein 4.9 (L) 6.5 - 8.1 g/dL   Albumin 1.6 (L) 3.5 - 5.0 g/dL   AST 14 (L) 15 - 41 U/L   ALT 5 0 - 44 U/L   Alkaline Phosphatase 133 (H) 38 - 126 U/L   Total Bilirubin 0.9 0.3 - 1.2 mg/dL   Bilirubin, Direct 0.3 (H) 0.0 - 0.2 mg/dL   Indirect Bilirubin 0.6 0.3 - 0.9 mg/dL    Comment: Performed at [facility], [address] [place], [place] [address]  Renal function panel     Status: Abnormal   Collection Time: 06/20/21  4:37 AM  Result Value Ref Range   Sodium 136 135 - 145 mmol/L   Potassium 4.5 3.5 - 5.1 mmol/L   Chloride 104 98 - 111 mmol/L   CO2 25 22 - 32 mmol/L   Glucose, Bld 90 70 - 99 mg/dL    Comment: Glucose reference range applies only to samples taken after fasting for at least 8  hours.   BUN 30 (H) 6 - 20 mg/dL   Creatinine, Ser 3.50 (H) 0.61 - 1.24 mg/dL   Calcium 6.9 (L) 8.9 - 10.3 mg/dL   Phosphorus 4.4 2.5 - 4.6 mg/dL   Albumin 1.6 (L) 3.5 - 5.0 g/dL   GFR, Estimated 24 (L) >60 mL/min    Comment: (NOTE) Calculated using the CKD-EPI Creatinine Equation (2021)    Anion gap  7 5 - 15    Comment: Performed at [facility], [address] [place], [place] [address]  CBC     Status: Abnormal   Collection Time: 06/20/21  4:37 AM  Result Value Ref Range   WBC 10.1 4.0 - 10.5 K/uL   RBC 2.47 (L) 4.22 - 5.81 MIL/uL   Hemoglobin 7.4 (L) 13.0 - 17.0 g/dL   HCT 23.0 (L) 39.0 - 52.0 %   MCV 93.1 80.0 - 100.0 fL   MCH 30.0 26.0 - 34.0 pg   MCHC 32.2 30.0 - 36.0 g/dL   RDW 15.1 11.5 - 15.5 %   Platelets 70 (L) 150 - 400 K/uL    Comment: Immature Platelet Fraction may be clinically indicated, consider ordering this additional test [id number] REPEATED TO VERIFY    nRBC 0.0 0.0 - 0.2 %    Comment: Performed at [facility], [address] [place], [place] [address]  Lactoferrin, Fecal, Qualitative     Status: Abnormal   Collection Time: 06/20/21  4:55 AM  Result Value Ref Range   Lactoferrin, Fecal, Qual POSITIVE (A) NEGATIVE    Comment: Performed at [facility], [address] [place], [place] [address]  Glucose, capillary     Status: Abnormal   Collection Time: 06/20/21  7:13 AM  Result Value Ref Range   Glucose-Capillary 52 (L) 70 - 99 mg/dL    Comment: Glucose reference range applies only to samples taken after fasting for at least 8 hours.  Glucose, capillary     Status: Abnormal   Collection Time: 06/20/21  8:41 AM  Result Value Ref Range   Glucose-Capillary 62 (L) 70 - 99 mg/dL    Comment: Glucose reference range applies only to samples taken after fasting for at least 8 hours.  Glucose, capillary     Status: Abnormal   Collection Time: 06/20/21 10:40 AM  Result Value Ref Range   Glucose-Capillary 112 (H) 70 - 99 mg/dL     Comment: Glucose reference range applies only to samples taken after fasting for at least 8 hours.   PERIPHERAL VASCULAR CATHETERIZATION  Result Date: 06/19/2021 See surgical note for result.     Blood pressure 131/82, pulse 85, temperature 98.1 F (36.7 C), resp. rate 16, height 5' 7.99" (1.727 m), weight 70.3 kg, SpO2 100 %.  Medical Problem List and Plan: 1. Functional deficits secondary to debility from  renal failure, heart failure, PEA arrest- possible anoxia?  -patient may not shower due to HD catheter  -ELOS/Goals: 12-14 days- supervision 2.  Antithrombotics: -DVT/anticoagulation:  Pharmaceutical: Lovenox  -antiplatelet therapy: none 3. Pain Management: Tylenol, oxycodone- pt reports pain "better controlled with fentanyl"- not sure when he received this and if a chance is needed? 4. Mood: LCSW to evaluate and provide emotional support  -antipsychotic agents: n/a 5. Neuropsych: This patient is capable of making decisions on his own behalf. 6. Skin/Wound Care: Sacral and Left heel unstageable/ decubitus ulcer wound care: [place] nurse consult- already had Santyl  --Eucerin as needed for dry skin 7. Fluids/Electrolytes/Nutrition: Fluid restriction 1200 mL daily  --per nephrology  --renal/carb modified diet. Continue supplements 8: ESRD on iHD via right IJ TDC on MWF schedule 9: DM 1: Continue Semglee 5 units at bedtime, SSI meal/HS coverage; CBGs- no oral agents due to type 1 DM 10: Heart failure: Continue Entresto BID, Coreg 11: Atrial fibrillation: Continue amiodarone BID; ? need for AC 12: Hypertension: continue Coreg 13: Anemia, chronic disease: Is getting iron supplementation  14: Tobacco use: provide cessation counseling  --? Nicotine patch 15: GERD: Continue PPI 16: Thrombocytopenia: Continue to monitor platelet count  --No signs of bleeding 17: Aspiration pneumonia: treated and resolved 18: Anoxic brain injury s/p PEA arrest: neuro intact but extremely  flat/disengaged 19: Diarrhea: + stool lactoferrin; C.diff was negative      --colonoscopy with biopsies 2/8  --continued scheduled loperamide for now and continue to monitor BMs     I have personally performed a face to face diagnostic evaluation of this patient and formulated the key components of the plan.  Additionally, I have personally reviewed laboratory data, imaging studies, as well as relevant notes and concur with the physician assistant's documentation above.   The patient's status has not changed from the original H&P.  Any changes in documentation from the acute care chart have been noted above.     Barbie Banner, PA-C 06/20/2021

## 2021-06-20 NOTE — Progress Notes (Signed)
Occupational Therapy Treatment Patient Details Name: Jonathan Mcmillan MRN: [id number] DOB: 1995-07-31 Today's Date: 06/20/2021   History of present illness Pt is a 26 y/o M admitted on 06/01/21 with AKI on CKD in setting of viral syndrome & diarrhea. Pt suffered in-hospital cardiac arrest, suspect due to severe metabolic acidosis & multiple metabolic derangements. Required CRRT and now preparing to undergo regular HD with R temp femoral catheter placed on 1/28. Pt was extubated on 1/21 but reintubated 1/22. Extubated again on 1/26 & now weaned to room air. PMH: DM2, CKD stage IV, HTN, nicotine dependence.   OT comments  Jonathan Mcmillan was seen for OT treatment on this date. Upon arrival to room pt seated EOB with PT, overlapping session for safe mobility. Pt requires SUPERVISION don/doff R sock seated EOC. MAX A + RW sit<>stand x3 at chair height - cues for arm rest use and rocking for momentum. Instructed on HEP, pt completed chair pushups 1 set x 20 reps, glue squeezes 10 reps. SpO2 100% t/o however increased WOB noted and pt requesting rest breaks between standing trials. Pt making good progress toward goals. Pt continues to benefit from skilled OT services to maximize return to PLOF and minimize risk of future falls, injury, caregiver burden, and readmission. Will continue to follow POC. Discharge recommendation remains appropriate.     Recommendations for follow up therapy are one component of a multi-disciplinary discharge planning process, led by the attending physician.  Recommendations may be updated based on patient status, additional functional criteria and insurance authorization.    Follow Up Recommendations  Acute inpatient rehab (3hours/day)    Assistance Recommended at Discharge Frequent or constant Supervision/Assistance  Patient can return home with the following  A lot of help with walking and/or transfers;A lot of help with bathing/dressing/bathroom;Assistance with  cooking/housework;Direct supervision/assist for medications management;Assist for transportation;Help with stairs or ramp for entrance   Equipment Recommendations  BSC/3in1    Recommendations for Other Services      Precautions / Restrictions Precautions Precautions: Fall Precaution Comments: R temporary femoral dialysis catheter removed 06/20/21. Per RN is ok to mobilize after 11 am. Restrictions Weight Bearing Restrictions: No       Mobility Bed Mobility               General bed mobility comments: received and left in sitting    Transfers Overall transfer level: Needs assistance Equipment used: Rolling walker (2 wheels) Transfers: Sit to/from Stand Sit to Stand: Max assist           General transfer comment: repeated cues for technqiue     Balance Overall balance assessment: Needs assistance Sitting-balance support: No upper extremity supported, Feet supported Sitting balance-Leahy Scale: Fair     Standing balance support: No upper extremity supported, During functional activity Standing balance-Leahy Scale: Fair Standing balance comment: reaching inside BOS                           ADL either performed or assessed with clinical judgement   ADL Overall ADL's : Needs assistance/impaired                                       General ADL Comments: SUPERVISION don/doff R sock seated EOB. MOD A x2 + RW for ADL t/f      Cognition Arousal/Alertness: Awake/alert Behavior During Therapy: Flat affect Overall  Cognitive Status: Within Functional Limits for tasks assessed                                          Exercises Other Exercises Other Exercises: completes chair pushups 1 set x 20 reps, glue squeezes 10 reps    Pertinent Vitals/ Pain       Pain Assessment Pain Assessment: No/denies pain   Frequency  Min 3X/week        Progress Toward Goals  OT Goals(current goals can now be found in the care  plan section)  Progress towards OT goals: Progressing toward goals  Acute Rehab OT Goals Patient Stated Goal: to go to rehab OT Goal Formulation: With patient Time For Goal Achievement: 06/24/21 Potential to Achieve Goals: Fair ADL Goals Pt Will Perform Eating: with set-up Pt Will Perform Grooming: with set-up Pt Will Perform Upper Body Bathing: with min guard assist Pt/caregiver will Perform Home Exercise Program: Increased strength;Both right and left upper extremity;With minimal assist Additional ADL Goal #1: Pt will tolerate further mobilization (once cleared by nephrology; at least sup to sit with MAX A) to allow for further development of OT POC.  Plan Discharge plan remains appropriate;Frequency remains appropriate    Co-evaluation    PT/OT/SLP Co-Evaluation/Treatment: Yes Reason for Co-Treatment: For patient/therapist safety PT goals addressed during session: Balance;Mobility/safety with mobility OT goals addressed during session: ADL's and self-care      AM-PAC OT "6 Clicks" Daily Activity     Outcome Measure   Help from another person eating meals?: None Help from another person taking care of personal grooming?: A Little Help from another person toileting, which includes using toliet, bedpan, or urinal?: A Lot Help from another person bathing (including washing, rinsing, drying)?: A Lot Help from another person to put on and taking off regular upper body clothing?: A Little Help from another person to put on and taking off regular lower body clothing?: A Little 6 Click Score: 17    End of Session Equipment Utilized During Treatment: Gait belt;Rolling walker (2 wheels)  OT Visit Diagnosis: Muscle weakness (generalized) (M62.81);Adult, failure to thrive (R62.7)   Activity Tolerance Patient tolerated treatment well   Patient Left in chair;with call bell/phone within reach;with chair alarm set   Nurse Communication Mobility status        Time: 0601-5615 OT  Time Calculation (min): 27 min  Charges: OT General Charges $OT Visit: 1 Visit OT Treatments $Therapeutic Exercise: 8-22 mins  Dessie Coma, M.S. OTR/L  06/20/21, 1:47 PM  ascom [phone]

## 2021-06-20 NOTE — Consult Note (Signed)
Cephas Darby, MD [address]  [place]  [place], [place] [address]  Main: [phone]  Fax: [phone] Pager: [phone]   Consultation  Referring Provider:     No ref. provider found Primary Care Physician:  [facility] [place] Primary Gastroenterologist: Althia Forts         Reason for Consultation:     Diarrhea  Date of Admission:  05/31/2021 Date of Consultation:  06/20/2021         HPI:   Jonathan Mcmillan is a 26 y.o. male history of stage IV CKD, tobacco use, hypertension, diabetes admitted with hypoxic respiratory failure secondary to pneumonia, had cardiac arrest on 06/01/2021, status postintubation, started on dialysis, 06/03/2021 extubated, reintubated 06/04/2021 due to large volume aspiration, worsening shock, extubated on 06/08/2021.  Apparently, patient has diarrhea since admission upon reviewing input and output chart.  His stool for C. difficile antigen and toxin was negative on 05/31/2021 along with GI pathogen panel.  Stool lactoferrin was checked on 2/7 due to ongoing diarrhea and it was positive, therefore GI is consulted for further evaluation.  Patient received a dose of Imodium yesterday.  He has a Flexi-Seal in place, has pudding like brown stool in the bag and the tube.  Patient denies any abdominal pain, nausea or vomiting.  Has been afebrile, no evidence of leukocytosis, has been hemodynamically stable.  Patient has been tolerating p.o. well  Patient reports that he was not experiencing diarrhea prior to admission.  He was told that he had gastroparesis because he had nausea and vomiting in the past which has resolved.  He is given a diagnosis of cannabis hyperemesis syndrome  NSAIDs: None  Antiplts/Anticoagulants/Anti thrombotics: None  GI Procedures: None  Past Medical History:  Diagnosis Date   Cannabinoid hyperemesis syndrome    CKD (chronic kidney disease), stage IV (HCC)    Diabetes 1.5, managed as type 1 ([place])    DKA  (diabetic ketoacidoses) 05/21/2016   Gastroparesis    HTN (hypertension)    Nicotine dependence    Perirectal abscess 06/16/2017   Right arm cellulitis 01/26/2018    Past Surgical History:  Procedure Laterality Date   DIALYSIS/PERMA CATHETER INSERTION N/A 06/15/2021   Procedure: DIALYSIS/PERMA CATHETER INSERTION;  Surgeon: Algernon Huxley, MD;  Location: [facility];  Service: Cardiovascular;  Laterality: N/A;   DIALYSIS/PERMA CATHETER INSERTION N/A 06/19/2021   Procedure: DIALYSIS/PERMA CATHETER INSERTION;  Surgeon: Algernon Huxley, MD;  Location: [facility];  Service: Cardiovascular;  Laterality: N/A;   INCISION AND DRAINAGE PERIRECTAL ABSCESS N/A 06/16/2017   Procedure: IRRIGATION AND DEBRIDEMENT PERIRECTAL ABSCESS;  Surgeon: Florene Glen, MD;  Location: [facility];  Service: General;  Laterality: N/A;   none      Prior to Admission medications   Medication Sig Start Date End Date Taking? Authorizing Provider  ondansetron (ZOFRAN) 4 MG tablet Take 1 tablet (4 mg total) by mouth every 8 (eight) hours as needed for up to 10 doses for nausea or vomiting. 05/31/21  Yes Lucrezia Starch, MD  amLODipine (NORVASC) 10 MG tablet Take 1 tablet (10 mg total) by mouth daily. Patient not taking: Reported on 06/01/2021 06/14/20   Fritzi Mandes, MD  hydrALAZINE (APRESOLINE) 50 MG tablet Take 1 tablet (50 mg total) by mouth 2 (two) times daily. Patient not taking: Reported on 06/01/2021 06/13/20   Fritzi Mandes, MD  insulin glargine (LANTUS) 100 UNIT/ML injection Inject 20 Units into the skin at bedtime.  Patient not taking: Reported on 06/01/2021 08/26/20   [provider]  omeprazole (PRILOSEC) 40 MG capsule Take 1 capsule (40 mg total) by mouth daily. 03/08/20 05/07/20  Arta Silence, MD  pantoprazole (PROTONIX) 40 MG tablet Take 1 tablet (40 mg total) by mouth daily. Patient not taking: Reported on 12/10/2018 09/10/18 01/02/20  Hillary Bow, MD  sucralfate (CARAFATE) 1 g tablet Take 1  tablet (1 g total) by mouth 4 (four) times daily. Patient not taking: Reported on 06/26/2019 02/04/19 01/02/20  Nance Pear, MD    Current Facility-Administered Medications:    (feeding supplement) PROSource Plus liquid 30 mL, 30 mL, Oral, TID BM, Samtani, Jai-Gurmukh, MD, 30 mL at 06/20/21 1034   0.9 %  sodium chloride infusion, , Intravenous, PRN, Algernon Huxley, MD, Stopped at 06/12/21 2313   0.9 %  sodium chloride infusion, , Intravenous, Continuous, Eustolia Drennen, Tally Due, MD   amiodarone (PACERONE) tablet 200 mg, 200 mg, Oral, Daily, Dew, Erskine Squibb, MD, 200 mg at 06/20/21 1023   ascorbic acid (VITAMIN C) tablet 500 mg, 500 mg, Oral, BID, Dew, Erskine Squibb, MD, 500 mg at 06/19/21 2200   calcitRIOL (ROCALTROL) capsule 0.5 mcg, 0.5 mcg, Oral, Daily, Algernon Huxley, MD, 0.5 mcg at 06/20/21 1022   calcium carbonate (TUMS - dosed in mg elemental calcium) chewable tablet 200 mg of elemental calcium, 1 tablet, Oral, TID, Samtani, Jai-Gurmukh, MD, 200 mg of elemental calcium at 06/20/21 1023   carvedilol (COREG) tablet 3.125 mg, 3.125 mg, Oral, BID WC, Algernon Huxley, MD, 3.125 mg at 06/20/21 2947   chlorhexidine gluconate (MEDLINE KIT) (PERIDEX) 0.12 % solution 15 mL, 15 mL, Mouth Rinse, BID, Dew, Erskine Squibb, MD, 15 mL at 06/19/21 2212   Chlorhexidine Gluconate Cloth 2 % PADS 6 each, 6 each, Topical, Daily, Dew, Erskine Squibb, MD, 6 each at 06/20/21 1023   Chlorhexidine Gluconate Cloth 2 % PADS 6 each, 6 each, Topical, Q0600, Algernon Huxley, MD, 6 each at 06/20/21 0600   cinacalcet (SENSIPAR) tablet 30 mg, 30 mg, Oral, Q breakfast, Verlon Au, Jai-Gurmukh, MD, 30 mg at 06/20/21 6546   collagenase (SANTYL) ointment, , Topical, Daily, Dew, Erskine Squibb, MD, Given at 06/20/21 1025   fentaNYL (SUBLIMAZE) injection 50 mcg, 50 mcg, Intravenous, Q2H PRN, Algernon Huxley, MD, 50 mcg at 06/20/21 1742   ferrous sulfate tablet 325 mg, 325 mg, Oral, Q breakfast, Dew, Erskine Squibb, MD, 325 mg at 06/20/21 0852   guaiFENesin-dextromethorphan (ROBITUSSIN  DM) 100-10 MG/5ML syrup 10 mL, 10 mL, Oral, Q6H PRN, Dew, Erskine Squibb, MD   hydrocerin (EUCERIN) cream, , Topical, BID, Algernon Huxley, MD, Given at 06/20/21 1024   HYDROmorphone (DILAUDID) injection 1 mg, 1 mg, Intravenous, Once PRN, Lucky Cowboy, Erskine Squibb, MD   insulin aspart (novoLOG) injection 0-15 Units, 0-15 Units, Subcutaneous, TID WC, Algernon Huxley, MD, 5 Units at 06/20/21 1738   ipratropium-albuterol (DUONEB) 0.5-2.5 (3) MG/3ML nebulizer solution 3 mL, 3 mL, Nebulization, Q6H PRN, Algernon Huxley, MD, 3 mL at 06/10/21 0808   lip balm (BLISTEX) ointment, , Topical, PRN, Algernon Huxley, MD, 1 application at 50/35/46 1829   [START ON 06/21/2021] lipase/protease/amylase (CREON) capsule 36,000 Units, 36,000 Units, Oral, TID WC, Tarina Volk, Tally Due, MD   loperamide (IMODIUM) capsule 2 mg, 2 mg, Oral, BID, Verlon Au, Jai-Gurmukh, MD, 2 mg at 06/20/21 1738   MEDLINE mouth rinse, 15 mL, Mouth Rinse, BID, Dew, Erskine Squibb, MD, 15 mL at 06/20/21 1035   multivitamin (RENA-VIT) tablet 1  tablet, 1 tablet, Oral, QHS, Dew, Erskine Squibb, MD, 1 tablet at 06/19/21 2200   ondansetron (ZOFRAN) injection 4 mg, 4 mg, Intravenous, Q6H PRN, Lucky Cowboy, Erskine Squibb, MD   ondansetron (ZOFRAN) tablet 4 mg, 4 mg, Oral, Q6H PRN **OR** [DISCONTINUED] ondansetron (ZOFRAN) injection 4 mg, 4 mg, Intravenous, Q6H PRN, Algernon Huxley, MD, 4 mg at 06/11/21 0255   oxyCODONE (Oxy IR/ROXICODONE) immediate release tablet 5 mg, 5 mg, Oral, Q4H PRN, Algernon Huxley, MD, 5 mg at 06/20/21 1626   pantoprazole (PROTONIX) EC tablet 40 mg, 40 mg, Oral, Daily, Dew, Erskine Squibb, MD, 40 mg at 06/20/21 1022   polyethylene glycol powder (GLYCOLAX/MIRALAX) container 255 g, 1 Container, Oral, Once, Ladona Rosten, Tally Due, MD   polyvinyl alcohol (LIQUIFILM TEARS) 1.4 % ophthalmic solution 2 drop, 2 drop, Both Eyes, PRN, Algernon Huxley, MD, 2 drop at 06/19/21 2159   sacubitril-valsartan (ENTRESTO) 24-26 mg per tablet, 1 tablet, Oral, BID, Scoggins, Amber, NP, 1 tablet at 06/20/21 1021   sodium chloride  flush (NS) 0.9 % injection 10-40 mL, 10-40 mL, Intracatheter, Q12H, Dew, Jason S, MD, 10 mL at 06/20/21 1024   sodium chloride flush (NS) 0.9 % injection 10-40 mL, 10-40 mL, Intracatheter, PRN, Algernon Huxley, MD, 10 mL at 06/11/21 0908   thiamine tablet 100 mg, 100 mg, Oral, Daily, Dew, Jason S, MD, 100 mg at 06/20/21 1022   zinc sulfate capsule 220 mg, 220 mg, Oral, Daily, Dew, Jason S, MD, 220 mg at 06/20/21 1022  Family History  Problem Relation Age of Onset   Diabetes Mother      Social History   Tobacco Use   Smoking status: Every Day    Packs/day: 0.50    Types: Cigarettes   Smokeless tobacco: Never  Vaping Use   Vaping Use: Never used  Substance Use Topics   Alcohol use: No   Drug use: Yes    Types: Marijuana    Allergies as of 05/31/2021   (No Known Allergies)    Review of Systems:    All systems reviewed and negative except where noted in HPI.   Physical Exam:  Vital signs in last 24 hours: Temp:  [98 F (36.7 C)-98.7 F (37.1 C)] 98 F (36.7 C) (02/07 1523) Pulse Rate:  [82-91] 84 (02/07 1523) Resp:  [0-18] 16 (02/07 1523) BP: (104-144)/(70-91) 104/70 (02/07 1523) SpO2:  [100 %] 100 % (02/07 1523) Weight:  [70.3 kg] 70.3 kg (02/06 1817) Last BM Date: 06/20/21 General:   Ill-appearing, appears malnourished Head:  Normocephalic and atraumatic. Eyes:   No icterus.   Conjunctiva pink. PERRLA. Ears:  Normal auditory acuity. Neck:  Supple; no masses or thyroidomegaly Lungs: Respirations even and unlabored. Lungs clear to auscultation bilaterally.   No wheezes, crackles, or rhonchi.  Heart:  Regular rate and rhythm;  Without murmur, clicks, rubs or gallops Abdomen:  Soft, nondistended, nontender. Normal bowel sounds. No appreciable masses or hepatomegaly.  No rebound or guarding.  Rectal:  Not performed. Msk: Generalized muscle wasting, strength generalized weakness Extremities:  Without edema, cyanosis or clubbing. Neurologic:  Alert and oriented x3;  grossly  normal neurologically. Skin: Dry scaly skin, dependent ulcers on his right foot Cervical Nodes:  No significant cervical adenopathy. Psych:  Alert and cooperative. Normal affect.  LAB RESULTS: CBC Latest Ref Rng & Units 06/20/2021 06/19/2021 06/17/2021  WBC 4.0 - 10.5 K/uL 10.1 9.2 6.3  Hemoglobin 13.0 - 17.0 g/dL 7.4(L) 7.9(L) 8.1(L)  Hematocrit 39.0 - 52.0 % 23.0(L)  25.0(L) 25.2(L)  Platelets 150 - 400 K/uL 70(L) 70(L) 58(L)    BMET BMP Latest Ref Rng & Units 06/20/2021 06/19/2021 06/17/2021  Glucose 70 - 99 mg/dL 90 98 172(H)  BUN 6 - 20 mg/dL 30(H) 50(H) 38(H)  Creatinine 0.61 - 1.24 mg/dL 3.50(H) 5.03(H) 3.43(H)  Sodium 135 - 145 mmol/L 136 138 135  Potassium 3.5 - 5.1 mmol/L 4.5 4.8 4.4  Chloride 98 - 111 mmol/L 104 106 104  CO2 22 - 32 mmol/L 25 24 24   Calcium 8.9 - 10.3 mg/dL 6.9(L) 6.7(L) 6.6(L)    LFT Hepatic Function Latest Ref Rng & Units 06/20/2021 06/20/2021 06/19/2021  Total Protein 6.5 - 8.1 g/dL - 4.9(L) -  Albumin 3.5 - 5.0 g/dL 1.6(L) 1.6(L) 1.8(L)  AST 15 - 41 U/L - 14(L) -  ALT 0 - 44 U/L - 5 -  Alk Phosphatase 38 - 126 U/L - 133(H) -  Total Bilirubin 0.3 - 1.2 mg/dL - 0.9 -  Bilirubin, Direct 0.0 - 0.2 mg/dL - 0.3(H) -     STUDIES: PERIPHERAL VASCULAR CATHETERIZATION  Result Date: 06/19/2021 See surgical note for result.     Impression / Plan:   Jonathan Mcmillan is a 26 y.o. African-American male with history of type 1 diabetes, stage IV CKD, tobacco use, hypertension who is admitted on 1/18 secondary to severe sepsis from pneumonia, complicated by cardiac arrest on 1/19 secondary to septic shock, s/p intubation and extubation.  GI is consulted for chronic nonbloody diarrhea.  Apparently patient has diarrhea since admission. GI pathogen panel and C. difficile negative on admission.  Fecal lactoferrin was checked on 2/7 due to ongoing diarrhea and came back positive  Unexplained nonbloody diarrhea with fecal lactoferrin positive Fecal lactoferrin represents leukocytes  which can be positive in inflammatory bowel disease Infectious etiology has been ruled out, other possibility is ischemic colitis Recommend colonoscopy with possible TI evaluation and random colon biopsies for further evaluation Check fecal calprotectin levels Check pancreatic fecal elastase levels given history of type 1 diabetes and association with exocrine pancreatic insufficiency Trial of Creon 36 K 1 capsule with each meal and 1 with snack long-term given failure to thrive Patient will need close follow-up with GI upon discharge  Thank you for involving me in the care of this patient.  GI will follow along with you    LOS: 20 days   Sherri Sear, MD  06/20/2021, 6:09 PM    Note: This dictation was prepared with Dragon dictation along with smaller phrase technology. Any transcriptional errors that result from this process are unintentional.

## 2021-06-20 NOTE — Progress Notes (Signed)
SUBJECTIVE: Jonathan Mcmillan is a 26 y.o. male with medical history including diabetes, CKD, HTN, nicotine dependence admitted on 05/31/2021 with AKI on CKD, anemia, and fevers/chills/myalgias . Patient suffered cardiac arrest on the morning of 1/19 and was ultimately intubated and transferred to the ICU. Patient extubated 06/08/21. Patient transferred to step down unit on 06/13/21.   Vitals:   06/19/21 1850 06/19/21 2018 06/20/21 0233 06/20/21 0715  BP: (!) 144/91 138/75 (!) 142/90 131/82  Pulse: 89 88 82 85  Resp: 18 18 18 16   Temp: 98.2 F (36.8 C) 98.4 F (36.9 C) 98.7 F (37.1 C) 98.1 F (36.7 C)  TempSrc:  Oral    SpO2: 100% 100% 100% 100%  Weight:      Height:        Intake/Output Summary (Last 24 hours) at 06/20/2021 0905 Last data filed at 06/20/2021 0000 Gross per 24 hour  Intake 590 ml  Output 1002 ml  Net -412 ml    LABS: Basic Metabolic Panel: Recent Labs    06/19/21 0905 06/20/21 0437  NA 138 136  K 4.8 4.5  CL 106 104  CO2 24 25  GLUCOSE 98 90  BUN 50* 30*  CREATININE 5.03* 3.50*  CALCIUM 6.7* 6.9*  PHOS 5.2* 4.4   Liver Function Tests: Recent Labs    06/19/21 0905 06/20/21 0437  AST  --  14*  ALT  --  5  ALKPHOS  --  133*  BILITOT  --  0.9  PROT  --  4.9*  ALBUMIN 1.8* 1.6*   1.6*   No results for input(s): LIPASE, AMYLASE in the last 72 hours. CBC: Recent Labs    06/19/21 0905 06/20/21 0437  WBC 9.2 10.1  HGB 7.9* 7.4*  HCT 25.0* 23.0*  MCV 92.6 93.1  PLT 70* 70*   Cardiac Enzymes: No results for input(s): CKTOTAL, CKMB, CKMBINDEX, TROPONINI in the last 72 hours. BNP: Invalid input(s): POCBNP D-Dimer: No results for input(s): DDIMER in the last 72 hours. Hemoglobin A1C: No results for input(s): HGBA1C in the last 72 hours. Fasting Lipid Panel: No results for input(s): CHOL, HDL, LDLCALC, TRIG, CHOLHDL, LDLDIRECT in the last 72 hours. Thyroid Function Tests: No results for input(s): TSH, T4TOTAL, T3FREE, THYROIDAB in the last 72  hours.  Invalid input(s): FREET3 Anemia Panel: Recent Labs    06/19/21 0905  FOLATE 7.9  FERRITIN 478*  TIBC 167*  IRON 80  RETICCTPCT 1.7     PHYSICAL EXAM General: Well developed, well nourished, in no acute distress HEENT:  Normocephalic and atramatic Neck:  No JVD.  Lungs: Clear bilaterally to auscultation and percussion. Heart: HRRR . Normal S1 and S2 without gallops or murmurs.  Abdomen: Bowel sounds are positive, abdomen soft and non-tender  Msk:  Back normal, normal gait. Normal strength and tone for age. Extremities: No clubbing, cyanosis or edema.   Neuro: Alert and oriented X 3. Psych:  Good affect, responds appropriately  TELEMETRY: sinus rhythm, HR 85 bpm  ASSESSMENT AND PLAN: Status post atrial fibrillation with rapid ventricular response rate currently on p.o. amiodarone 200 twice daily advised changing to 200 mg daily.  Status post cardiac arrest due to asystole now extubated and much more alert and oriented.  Echo 06/05/21 revealed EF 35-40%. Currently on carvedilol. B/p remaining elevated. Patient currently dialyzing three days a week. Will add Entresto for low EF. Will continue to follow.   Active Problems:   Type 1 diabetes mellitus with complication (HCC)   Thrombocytopenia (HCC)  ESRD (end stage renal disease) (HCC)   Paroxysmal atrial fibrillation (HCC)   Chronic combined systolic and diastolic CHF (congestive heart failure) (HCC)   Pressure injury of buttock, stage 2 (HCC)   Complication associated with dialysis catheter   Aspiration pneumonia ([place])    Jonathan Faraci, FNP-C 06/20/2021 9:05 AM

## 2021-06-20 NOTE — Progress Notes (Addendum)
Inpatient Rehab Admissions Coordinator:   I am following for potential CIR admit pending clearance from acute MD. Pt. Likely will not admit to CIR today.   Clemens Catholic, [place], [place] Admissions Coordinator  [phone] ([place]) [phone] (office)

## 2021-06-20 NOTE — Discharge Summary (Addendum)
Physician Discharge Summary   Patient: Jonathan Mcmillan MRN: [id number] DOB: Oct 03, 1995  Admit date:     05/31/2021  Discharge date: 06/23/21  Discharge Physician: Nita Sells   PCP: [facility] [place]   Recommendations at discharge:   Rec Iron panel, Tsat 1-2 weeks and EPO Rec OP consideration for Colonoscopy if C. difficile is negative-Dr. Marius Ditch cc'd on d/c and appreciate input Rec wound care to heel/sacrum as per [place] nurse [will be seen tomorrow prior to d/c for recommendations--see pictures in chart today] Recommend further follow-up in the outpatient setting for lower extremity wounds Needs continued education regarding diabetes mellitus and deleterious effects Will be getting dialysis in the outpatient setting as per schedule below  Discharge Diagnoses: Active Problems:   Type 1 diabetes mellitus with complication (HCC)   Thrombocytopenia (HCC)   ESRD (end stage renal disease) (HCC)   Paroxysmal atrial fibrillation (HCC)   Chronic combined systolic and diastolic CHF (congestive heart failure) (HCC)   Pressure injury of buttock, stage 2 (HCC)   Complication associated with dialysis catheter   Aspiration pneumonia (HCC)  Principal Problem (Resolved):   Acute kidney injury superimposed on CKD ([place]) Resolved Problems:   AKI (acute kidney injury) ([place])   Symptomatic anemia   Septic shock (HCC)   Cardiopulmonary arrest ([place])   Pressure injury of skin   Acute respiratory failure with hypoxia [facility])   Hospital Course: 26 year old black male diagnosed with diabetes mellitus type 1 in 2012 Several prior episodes and admissions for DKA--significant history of noncompliance noted in the past (not taking insulin for years at a time) Several admissions for abscesses in 2018 2019 Tobacco, cannabinoid hyperemesis syndrome additionally Accelerated hypertension supposed to be on meds Last hospitalization 06/10/2020 through 06/13/2020-at that admission was  recovering from [place] (05/22/2020)-initial creatinine was 5.5 and he recovered  Readmit 05/31/2021 3/7 H/0 fever chills nonproductive cough myalgias-Tmax 102.6 and met sepsis criteria and lactic acid 2.1 BUN/creatinine up to 13.8 from baseline of 3.9 Platelet count 172 Urine analysis gross proteinuria CXR no acute cardiopulmonary disease   Procedures and Significant Results:  ACLS 2/2 asystole/PEA arrest 2/2 metabolic derangements 9/37/1696 06/01/2021 intubation, placement central line, A-line, trialysis catheter, CRRT initiated 06/03/2021 extubated, reintubated 06/04/2021 due to large volume aspiration, worsening shock. 2/2 Placement of a 19 cm tip to cuff tunneled hemodialysis catheter via the right internal jugular vein Received several platelet transfusions for severe thrombocytopenia felt secondary to severe sepsis Transferred to hospitalist service 06/13/2021-placed on dysphagia diet postextubation    Consultants:  PCCM Nephrology - AKI--declared ESRD 06/14/2021 Cardiology  -cardiac arrest, HFrEF-35-40% global hypokinesis hematology -thrombocytopenia Neurology -anoxic brain injury status postcardiac arrest Vascular surgeon -hemodialysis catheter  Gastroenterology consulted because of positive lactoferrin-plans for outpatient colonoscopy rule out IBD--- need to follow C. difficile quick test as well as fecal calprotectin in a.m. 06/21/2021  Assessment and Plan:  Renal\-AKI progressing to ESRD previously on CRRT now on IHD Continue calcitriol 0.5, Sensipar 30 mg q. breakfast, add calcium carbonate 500 3 times daily Obtain PTH as per renal Appears patient is clipped [facility] Monday Wednesday Friday at 10:45 AM CAN D/C TO CIR in am--pending [place] nurse input prior to d/c ID Septic shock (HCC)-resolved as of 06/18/2021 Aspiration pneumonia ([place]) Secondary to aspiration pneumonia, treated with antibiotics, now resolved. Pressure injury of buttock, stage 2 ([place])- (present on  admission) Lower extremity decubitus ulcers Management per wound care--patient also seems to have wounds on the lateral aspect of the left foot with scaling  and desquamation of the skin-      R heel has sloughing of skin I will ask wound nurse to assess prior to discharge to CIR-dressings q. other day wet-to-dry/[place] Heme Anemia of critical illness with sepsis + anemia of renal disease anemia-resolved as of 06/18/2021 Hemoglobin stable status post blood products.   anemia panel 2/7--OP needs EPO--can continue p.o. FeSO4 325 daily, zinc, ascorbic acid, thiamine Thrombocytopenia (HCC) MAHA, ITP, dITP ruled out-schistocytes are secondary to CRRT causing flow issues/damage to platelets Thorough evaluation per hematology, conclusion: Sepsis as cause.   SCDs only do not use heparin Cardiology Chronic combined systolic and diastolic CHF (congestive heart failure) ([place]) Cardiopulmonary arrest (HCC)-resolved as of 06/18/2021 PEA arrest probably from severe metabolic abnormalities. Paroxysmal atrial fibrillation (HCC) CHADS2 score about 2--no anticoagulation because of moderate to severe thrombocytopenia New diagnosis this admission.   Cardiology felt to be nonischemic based on age, no further evaluation was suggested Current meds = amiodarone 200 daily, Coreg 3.125, Entresto twice daily Volume managed by dialysis Check periodic LFT/TSH as OP Type 1 diabetes mellitus with complication (HCC) CBG stable, continue sliding scale insulin and long-acting insulin. GI Patient has Flexi-Seal in place with loose stool-this has been in place since 1/19--discontinued Colace,  start Imodium 2 mg BID Continue pantoprazole 40 daily for stress prophylaxis Stool GI pathogen panel 1/18 +C. difficile  were negative Doubt infectious, Lactoferrin + [? IBD]--Follow Calprotectin start Imodium 2 mg twice daily scheduled Can get OP colonoscopy [if cdiff neg]--appreciate Dr. Thayer [place]. Delaine Lame collaboration on  this case Skin Decubitus ulcer, multiple wounds Santyl to sacral wound, Eucerin to peeling skin-does need wound care to address lower extremities [likely will be on 06/21/21 am] Continue management with [place]            Consultants: As above discussion Procedures performed: Multiple Disposition: Rehabilitation facility Diet recommendation:  Discharge Diet Orders (From admission, onward)     Start     Ordered   06/20/21 0000  Diet - low sodium heart healthy        06/20/21 1659           Renal diet  DISCHARGE MEDICATION: Allergies as of 06/20/2021   No Known Allergies      Medication List     STOP taking these medications    amLODipine 10 MG tablet Commonly known as: NORVASC   hydrALAZINE 50 MG tablet Commonly known as: APRESOLINE   insulin glargine 100 UNIT/ML injection Commonly known as: LANTUS       TAKE these medications    amiodarone 200 MG tablet Commonly known as: PACERONE Take 1 tablet (200 mg total) by mouth daily. Start taking on: June 21, 2021   calcitRIOL 0.5 MCG capsule Commonly known as: ROCALTROL Take 1 capsule (0.5 mcg total) by mouth daily. Start taking on: June 21, 2021   calcium carbonate 500 MG chewable tablet Commonly known as: TUMS - dosed in mg elemental calcium Chew 1 tablet (200 mg of elemental calcium total) by mouth 3 (three) times daily.   carvedilol 3.125 MG tablet Commonly known as: COREG Take 1 tablet (3.125 mg total) by mouth 2 (two) times daily with a meal. Start taking on: June 21, 2021   cinacalcet 30 MG tablet Commonly known as: SENSIPAR Take 1 tablet (30 mg total) by mouth daily with breakfast. Start taking on: June 21, 2021   collagenase ointment Commonly known as: SANTYL Apply topically daily. Start taking on: June 21, 2021   ferrous sulfate 325 (65 FE)  MG tablet Take 1 tablet (325 mg total) by mouth daily with breakfast. Start taking on: June 21, 2021   hydrocerin Crea Apply 1  application topically 2 (two) times daily.   insulin aspart 100 UNIT/ML injection Commonly known as: novoLOG Inject 0-15 Units into the skin 3 (three) times daily with meals.   loperamide 2 MG capsule Commonly known as: IMODIUM Take 1 capsule (2 mg total) by mouth 2 (two) times daily.   multivitamin Tabs tablet Take 1 tablet by mouth at bedtime.   omeprazole 40 MG capsule Commonly known as: PRILOSEC Take 1 capsule (40 mg total) by mouth daily.   ondansetron 4 MG tablet Commonly known as: Zofran Take 1 tablet (4 mg total) by mouth every 8 (eight) hours as needed for up to 10 doses for nausea or vomiting.   oxyCODONE 5 MG immediate release tablet Commonly known as: Oxy IR/ROXICODONE Take 1 tablet (5 mg total) by mouth every 4 (four) hours as needed for severe pain.   sacubitril-valsartan 24-26 MG Commonly known as: ENTRESTO Take 1 tablet by mouth 2 (two) times daily.               Discharge Care Instructions  (From admission, onward)           Start     Ordered   06/20/21 0000  Discharge wound care:       Comments: As per notes   06/20/21 1659            Follow-up Information     [place]. Schedule an appointment as soon as possible for a visit in 3 days.   Why: To establish PCP care and follow-up today's ED visit. Contact information: [place] [address] [phone]                 Discharge Exam: Danley Danker Weights   06/19/21 1036 06/19/21 1328 06/19/21 1817  Weight: 66.9 kg 71.1 kg 70.3 kg   Cachectic black male leathery type skin Chest clear no rales rhonchi abdomen soft no rebound ROM intact No lower extremity edema desquamating skin see pictures above Multiple decubiti sacral decubiti not reviewed today Neurologically intact although quite weak  Condition at discharge: fair  The results of significant diagnostics from this hospitalization (including imaging, microbiology, ancillary and laboratory)  are listed below for reference.   Imaging Studies: DG Chest 1 View  Result Date: 06/03/2021 CLINICAL DATA:  Post intubation EXAM: CHEST  1 VIEW COMPARISON:  06/02/2021, 05/31/2021, 03/08/2020 FINDINGS: Tip of the endotracheal tube is about 3.3 cm superior to carina. Cardiomegaly with bilateral effusions and worsened basilar airspace disease. Vascular congestion. Esophageal tube has been removed. IMPRESSION: 1. Endotracheal tube tip about 3.3 cm superior to carina. 2. Cardiomegaly with interim development of bilateral effusions and basilar airspace disease. Vascular congestion and mild edema. Overall aeration is worse as compared with 06/02/2021. Electronically Signed   By: Donavan Foil M.D.   On: 06/03/2021 23:24   DG Chest 2 View  Result Date: 05/31/2021 CLINICAL DATA:  Cough.  Fever. EXAM: CHEST - 2 VIEW COMPARISON:  Multiple chest XRs, most recently 03/08/2020. CT chest, 01/02/2020. FINDINGS: Cardiomediastinal silhouette is within normal limits given positioning and technique. Lungs are well inflated. No focal consolidation or mass. No pleural effusion or pneumothorax. No acute displaced fracture. IMPRESSION: No active cardiopulmonary disease. Electronically Signed   By: Michaelle Birks M.D.   On: 05/31/2021 07:34   DG Abd 1 View  Result Date:  06/09/2021 CLINICAL DATA:  NG tube placement. EXAM: ABDOMEN - 1 VIEW COMPARISON:  KUB, most recently 06/09/2021. CT chest and pelvis, 06/03/2018. FINDINGS: Support lines: Enteric feeding tube, with tip and side port within stomach. Imaged bowel gas is nonobstructed. No radio-opaque calculi or other significant radiographic abnormality are seen. IMPRESSION: 1. Gastric placement of enteric feeding tube 2. Nonobstructed gas pattern of the imaged bowel. Electronically Signed   By: Michaelle Birks M.D.   On: 06/09/2021 10:27   DG Abd 1 View  Result Date: 06/09/2021 CLINICAL DATA:  Nasogastric tube placement EXAM: ABDOMEN - 1 VIEW COMPARISON:  06/04/2021 FINDINGS:  Enteric tube descends below the diaphragm with the side port and tip overlying the stomach. This appears to be a different tube than on the prior 06/04/2021 radiograph. Nonobstructive bowel-gas pattern. No portal venous gas or pneumatosis. The lung bases are clear. No acute skeletal abnormality. IMPRESSION: Enteric tube in appropriate position. Electronically Signed   By: Yvonne Kendall M.D.   On: 06/09/2021 10:25   DG Abd 1 View  Result Date: 06/04/2021 CLINICAL DATA:  OG tube. EXAM: ABDOMEN - 1 VIEW COMPARISON:  CT CAP, 06/03/2021.  KUB, 06/03/2021 and 06/01/2021. FINDINGS: Support lines: Enteric tube with tip and side port within stomach. RIGHT femoral dialysis catheter, incompletely imaged. Overlying pacer leads. The bowel gas pattern is normal.  No interval osseous abnormality. IMPRESSION: 1. Well-positioned enteric feeding tube, with tip and side port within stomach. Additional lines and tubes as above. 2. Nonobstructed bowel-gas. Electronically Signed   By: Michaelle Birks M.D.   On: 06/04/2021 08:39   DG Abd 1 View  Result Date: 06/03/2021 CLINICAL DATA:  Distension EXAM: ABDOMEN - 1 VIEW COMPARISON:  06/01/2021 FINDINGS: Nonobstructed gas pattern with paucity of bowel gas. Bilateral femoral catheters, right catheter tip over lies the right aspect of the L2 vertebral body. Left catheter tip overlies the upper aspect of left sacrum. IMPRESSION: Nonobstructed gas pattern with overall paucity of bowel gas. Esophageal tube has been removed. Electronically Signed   By: Donavan Foil M.D.   On: 06/03/2021 23:25   DG Abd 1 View  Result Date: 06/01/2021 CLINICAL DATA:  Encounter for feeding tube placement EXAM: ABDOMEN - 1 VIEW COMPARISON:  X-ray abdomen 06/11/2020. FINDINGS: Enteric tube coursing below the hemidiaphragm with tip and side port overlying the gastric lumen. Tip likely in the region of the pylorus/first portion of the duodenum. Bilateral femoral approach catheters noted with left catheter  overlying the L5 vertebral body and right catheter with tip overlying the right aspect of the L1 vertebral body. Nonobstructive bowel gas pattern. Slight distension of the small bowel with gas. No radio-opaque calculi or other significant radiographic abnormality are seen. IMPRESSION: Enteric tube coursing below within the gastric lumen with tip overlying the expected region of the pylorus. Consider retracting by 3 cm. Electronically Signed   By: Iven Finn M.D.   On: 06/01/2021 18:11   CT HEAD WO CONTRAST (5MM)  Result Date: 06/04/2021 CLINICAL DATA:  Initial evaluation for neuro deficit, stroke suspected. EXAM: CT HEAD WITHOUT CONTRAST TECHNIQUE: Contiguous axial images were obtained from the base of the skull through the vertex without intravenous contrast. RADIATION DOSE REDUCTION: This exam was performed according to the departmental dose-optimization program which includes automated exposure control, adjustment of the mA and/or kV according to patient size and/or use of iterative reconstruction technique. COMPARISON:  Prior CT from 06/01/2021. FINDINGS: Brain: Cerebral volume stable, and remains within normal limits. No acute intracranial hemorrhage. No acute  large vessel territory infarct. No mass lesion or midline shift. No hydrocephalus or extra-axial fluid collection. Vascular: No hyperdense vessel. Skull: Scalp soft tissues demonstrate no new abnormality. Suspected anasarca. Calvarium intact. Sinuses/Orbits: Globes and orbital soft tissues demonstrate no acute finding. Scattered mucosal thickening noted about the sphenoid ethmoidal and maxillary sinuses. Few scattered superimposed small air-fluid levels noted. Trace bilateral mastoid effusions. Other: None. IMPRESSION: 1. Stable head CT with no acute intracranial abnormality. 2. Scattered paranasal sinus disease with superimposed small air-fluid levels, suggesting acute sinusitis. Associated trace mastoid effusions. Appearance is similar to  previous. Electronically Signed   By: Jeannine Boga M.D.   On: 06/04/2021 01:14   CT HEAD WO CONTRAST (5MM)  Result Date: 06/01/2021 CLINICAL DATA:  Altered mental status EXAM: CT HEAD WITHOUT CONTRAST TECHNIQUE: Contiguous axial images were obtained from the base of the skull through the vertex without intravenous contrast. RADIATION DOSE REDUCTION: This exam was performed according to the departmental dose-optimization program which includes automated exposure control, adjustment of the mA and/or kV according to patient size and/or use of iterative reconstruction technique. COMPARISON:  05/21/2016 FINDINGS: Brain: No acute intracranial findings are seen. There are no signs of bleeding within the cranium. Ventricles are not dilated. There is no shift of midline structures. There are no epidural or subdural fluid collections. Vascular: Unremarkable. Skull: Unremarkable. Sinuses/Orbits: Air-fluid level is seen in the sphenoid sinus. There is mucosal thickening in the ethmoid and maxillary sinuses. Small air-fluid levels are seen in both maxillary sinuses. Other: None IMPRESSION: No acute intracranial findings are seen in noncontrast CT brain. Chronic sinusitis. Air-fluid levels are seen in the sphenoid and maxillary sinuses suggesting possible acute sinusitis. Electronically Signed   By: Elmer Picker M.D.   On: 06/01/2021 17:03   CT CHEST WO CONTRAST  Result Date: 05/31/2021 CLINICAL DATA:  Body aches, chills, nausea since Sunday. EXAM: CT CHEST WITHOUT CONTRAST TECHNIQUE: Multidetector CT imaging of the chest was performed following the standard protocol without IV contrast. RADIATION DOSE REDUCTION: This exam was performed according to the departmental dose-optimization program which includes automated exposure control, adjustment of the mA and/or kV according to patient size and/or use of iterative reconstruction technique. COMPARISON:  Same day chest radiograph, CT chest 01/02/2020 FINDINGS:  Cardiovascular: The heart is not enlarged. There is trace pericardial fluid. The blood pool is hypodense suggesting anemia. The vasculature is unremarkable, as can be assessed in the absence of intravenous contrast. Mediastinum/Nodes: The thyroid is unremarkable. The esophagus is grossly unremarkable. There is no mediastinal or axillary lymphadenopathy. There is no bulky hilar adenopathy, suboptimally evaluated in the absence of intravenous contrast. Lungs/Pleura: The trachea and central airways are patent. The lungs are well inflated. There is no focal consolidation or pulmonary edema. There is no pleural effusion or pneumothorax. Upper Abdomen: The imaged portions of the upper abdominal viscera are unremarkable. Musculoskeletal: Multiple remote right-sided rib fractures are seen. There is no acute osseous abnormality or aggressive osseous lesion. Other: There is mild diffuse body wall edema. IMPRESSION: 1. Trace pericardial effusion and mild diffuse body wall edema, new since 01/02/2020. 2. Clear lungs, with no focal consolidation or pleural effusion. 3. Hypodense blood pool consistent with anemia. Electronically Signed   By: Valetta Mole M.D.   On: 05/31/2021 12:08   US RENAL  Result Date: 05/31/2021 CLINICAL DATA:  Acute kidney injury EXAM: RENAL / URINARY TRACT ULTRASOUND COMPLETE COMPARISON:  06/10/2020 FINDINGS: Right Kidney: Renal measurements: 10.9 x 6.1 x 7.0 cm = volume: 244 mL. Increased  cortical echogenicity with medical renal disease. Trace retroperitoneal perinephric edema. No hydronephrosis focal abnormality. Left Kidney: Renal measurements: 11.1 x 6.9 x 6.5 cm = volume: 257 mL. Similar increased cortical echogenicity compatible with medical renal disease. Similar trace retroperitoneal perinephric fluid/edema. No hydronephrosis. No focal abnormality. Bladder: Nearly collapsed, accounting for wall prominence Other: None. IMPRESSION: Increased renal echogenicity compatible with medical renal  disease. Retroperitoneal perinephric edema suspect related to volume overload. Electronically Signed   By: Jerilynn Mages.  Shick M.D.   On: 05/31/2021 14:01   PERIPHERAL VASCULAR CATHETERIZATION  Result Date: 06/19/2021 See surgical note for result.  PERIPHERAL VASCULAR CATHETERIZATION  Result Date: 06/15/2021 See surgical note for result.  US Venous Img Lower Bilateral (DVT)  Result Date: 06/04/2021 CLINICAL DATA:  Initial evaluation for bilateral lower extremity swelling. EXAM: BILATERAL LOWER EXTREMITY VENOUS DOPPLER ULTRASOUND TECHNIQUE: Gray-scale sonography with graded compression, as well as color Doppler and duplex ultrasound were performed to evaluate the lower extremity deep venous systems from the level of the common femoral vein and including the common femoral, femoral, profunda femoral, popliteal and calf veins including the posterior tibial, peroneal and gastrocnemius veins when visible. The superficial great saphenous vein was also interrogated. Spectral Doppler was utilized to evaluate flow at rest and with distal augmentation maneuvers in the common femoral, femoral and popliteal veins. COMPARISON:  None. FINDINGS: RIGHT LOWER EXTREMITY Common Femoral Vein: No evidence of thrombus. Normal compressibility, respiratory phasicity and response to augmentation. Saphenofemoral Junction: No evidence of thrombus. Normal compressibility and flow on color Doppler imaging. Profunda Femoral Vein: No evidence of thrombus. Normal compressibility and flow on color Doppler imaging. Femoral Vein: No evidence of thrombus. Normal compressibility, respiratory phasicity and response to augmentation. Popliteal Vein: No evidence of thrombus. Normal compressibility, respiratory phasicity and response to augmentation. Calf Veins: No evidence of thrombus. Normal compressibility and flow on color Doppler imaging. Superficial Great Saphenous Vein: No evidence of thrombus. Normal compressibility. Venous Reflux:  None. Other  Findings:  None. LEFT LOWER EXTREMITY Common Femoral Vein: Not seen, obscured by overlying bandaging material. Saphenofemoral Junction: Not seen, obscured by overlying bandaging material. Profunda Femoral Vein: Not seen, obscured by overlying bandaging material. Femoral Vein: No evidence of thrombus. Normal compressibility, respiratory phasicity and response to augmentation. Popliteal Vein: No evidence of thrombus. Normal compressibility, respiratory phasicity and response to augmentation. Calf Veins: No evidence of thrombus. Normal compressibility and flow on color Doppler imaging. Superficial Great Saphenous Vein: No evidence of thrombus. Normal compressibility. Venous Reflux:  None. Other Findings:  None. IMPRESSION: 1. Nonvisualization of the left common femoral vein, saphenofemoral junction, and profunda femoral vein, obscured by overlying bandaging material. 2. Otherwise no evidence of deep venous thrombosis in either lower extremity. Electronically Signed   By: Jeannine Boga M.D.   On: 06/04/2021 05:30   DG Chest Port 1 View  Result Date: 06/08/2021 CLINICAL DATA:  Support devices EXAM: PORTABLE CHEST 1 VIEW COMPARISON:  Chest x-ray dated June 05, 2021 FINDINGS: Unchanged position of ET tube. Interval placement of esophageal temperature probe with tip just below the thoracic inlet. New left IJ line with tip positioned over the expected area of the upper right atrium. Cardiac and mediastinal contours are unchanged. Interval resolution of bibasilar opacities. No large pleural effusion or evidence of pneumothorax. IMPRESSION: 1. New left IJ line with tip positioned over the expected area of the upper right atrium. 2. Interval placement of enteric tube with tip overlying the stomach and side port near the GE junction, recommend advancement for optimal  positioning. 3. Interval resolution of bibasilar opacities. Electronically Signed   By: Yetta Glassman M.D.   On: 06/08/2021 13:37   DG Chest Port  1 View  Result Date: 06/02/2021 CLINICAL DATA:  Acute respiratory failure, hypoxia EXAM: PORTABLE CHEST 1 VIEW COMPARISON:  Chest radiograph 1 day prior FINDINGS: The endotracheal tube is approximately 2.6 cm from the carina. The enteric catheter tip is off the field of view. A presumed esophageal temperature probe terminates in the upper thorax. The cardiac silhouette is enlarged, unchanged. The mediastinal contours are stable. There are patchy opacities in the lung bases, particularly on the right, likely not significantly changed in the interim. There is no significant pleural effusion. There is no pneumothorax. There is no acute osseous abnormality. IMPRESSION: 1. Unchanged enlargement of the cardiac silhouette. 2. Patchy bibasilar opacities are not significantly changed and may reflect atelectasis or developing infection. 3. Presumed esophageal temperature probe terminating in the upper thorax. Endotracheal tube in satisfactory position. Enteric catheter tip is off the field of view. Electronically Signed   By: Valetta Mole M.D.   On: 06/02/2021 07:25   DG Chest Port 1 View  Result Date: 06/01/2021 CLINICAL DATA:  Intubation. EXAM: PORTABLE CHEST 1 VIEW COMPARISON:  Chest radiographs and CT 05/31/2021 FINDINGS: An endotracheal tube has been placed and terminates proximally 2.5 cm above the carina. Telemetry leads overlie the chest. The cardiac silhouette is accentuated by portable AP technique although true mild interval enlargement, such as from a pericardial effusion, is not excluded. Lung volumes are lower than on the prior radiographs, and there are mild opacities in both lung bases. No sizable pleural effusion or pneumothorax is identified. IMPRESSION: 1. Endotracheal tube in appropriate position. 2. Low lung volumes with mild bibasilar opacities which may reflect atelectasis or infection. Electronically Signed   By: Logan Bores M.D.   On: 06/01/2021 13:56   ECHOCARDIOGRAM COMPLETE  Result Date:  06/01/2021    ECHOCARDIOGRAM REPORT   Patient Name:   CASMIR AUGUSTE Date of Exam: 06/01/2021 Medical Rec #:  [id number]       Height:       68.0 in Accession #:    [id number]      Weight:       145.0 lb Date of Birth:  1995/06/27       BSA:          1.783 m Patient Age:    25 years        BP:           155/83 mmHg Patient Gender: M               HR:           95 bpm. Exam Location:  [facility] Procedure: 2D Echo, Color Doppler, Cardiac Doppler and Strain Analysis Indications:     I46.9 Cardiac arrest  History:         Patient has no prior history of Echocardiogram examinations.                  ESRD, stage IV, Signs/Symptoms:Fever; Risk Factors:Diabetes.  Sonographer:     Charmayne Sheer Referring Phys:  [id number] Bradly Bienenstock Diagnosing Phys: Ida Rogue MD  Sonographer Comments: Echo performed with patient supine and on artificial respirator. Global longitudinal strain was attempted. IMPRESSIONS  1. Left ventricular ejection fraction, by estimation, is 35 to 40%. The left ventricle has moderately decreased function. The left ventricle demonstrates global hypokinesis. There is moderate left ventricular hypertrophy.  Left ventricular diastolic parameters are consistent with Grade II diastolic dysfunction (pseudonormalization). The average left ventricular global longitudinal strain is -11.3 %. The global longitudinal strain is abnormal.  2. Right ventricular systolic function is mildly reduced. The right ventricular size is normal. There is moderately elevated pulmonary artery systolic pressure. The estimated right ventricular systolic pressure is 97.6 mmHg.  3. Left atrial size was mildly dilated.  4. The mitral valve is normal in structure. Mild mitral valve regurgitation. No evidence of mitral stenosis.  5. The aortic valve is normal in structure. Aortic valve regurgitation is mild. No aortic stenosis is present.  6. The inferior vena cava is dilated in size with <50% respiratory variability, suggesting right atrial  pressure of 15 mmHg. FINDINGS  Left Ventricle: Left ventricular ejection fraction, by estimation, is 35 to 40%. The left ventricle has moderately decreased function. The left ventricle demonstrates global hypokinesis. The average left ventricular global longitudinal strain is -11.3 %. The global longitudinal strain is abnormal. The left ventricular internal cavity size was normal in size. There is moderate left ventricular hypertrophy. Left ventricular diastolic parameters are consistent with Grade II diastolic dysfunction (pseudonormalization). Right Ventricle: The right ventricular size is normal. No increase in right ventricular wall thickness. Right ventricular systolic function is mildly reduced. There is moderately elevated pulmonary artery systolic pressure. The tricuspid regurgitant velocity is 2.80 m/s, and with an assumed right atrial pressure of 15 mmHg, the estimated right ventricular systolic pressure is 73.4 mmHg. Left Atrium: Left atrial size was mildly dilated. Right Atrium: Right atrial size was normal in size. Pericardium: There is no evidence of pericardial effusion. Mitral Valve: The mitral valve is normal in structure. Mild mitral valve regurgitation. No evidence of mitral valve stenosis. MV peak gradient, 5.4 mmHg. The mean mitral valve gradient is 3.0 mmHg. Tricuspid Valve: The tricuspid valve is normal in structure. Tricuspid valve regurgitation is mild . No evidence of tricuspid stenosis. Aortic Valve: The aortic valve is normal in structure. Aortic valve regurgitation is mild. No aortic stenosis is present. Aortic valve mean gradient measures 6.0 mmHg. Aortic valve peak gradient measures 10.1 mmHg. Aortic valve area, by VTI measures 2.64  cm. Pulmonic Valve: The pulmonic valve was normal in structure. Pulmonic valve regurgitation is trivial. No evidence of pulmonic stenosis. Aorta: The aortic root is normal in size and structure. Venous: The inferior vena cava is dilated in size with less  than 50% respiratory variability, suggesting right atrial pressure of 15 mmHg. IAS/Shunts: No atrial level shunt detected by color flow Doppler.  LEFT VENTRICLE PLAX 2D LVIDd:         4.90 cm   Diastology LVIDs:         3.79 cm   LV e' medial:    8.59 cm/s LV PW:         1.43 cm   LV E/e' medial:  14.4 LV IVS:        0.93 cm   LV e' lateral:   9.46 cm/s LVOT diam:     2.10 cm   LV E/e' lateral: 13.1 LV SV:         58 LV SV Index:   33        2D Longitudinal Strain LVOT Area:     3.46 cm  2D Strain GLS Avg:     -11.3 %  RIGHT VENTRICLE RV Basal diam:  3.95 cm RV S prime:     11.40 cm/s LEFT ATRIUM  Index        RIGHT ATRIUM           Index LA diam:        4.40 cm 2.47 cm/m   RA Area:     20.10 cm LA Vol (A2C):   77.6 ml 43.53 ml/m  RA Volume:   60.00 ml  33.66 ml/m LA Vol (A4C):   73.9 ml 41.45 ml/m LA Biplane Vol: 79.4 ml 44.54 ml/m  AORTIC VALVE                     PULMONIC VALVE AV Area (Vmax):    2.40 cm      PV Vmax:       1.12 m/s AV Area (Vmean):   2.23 cm      PV Vmean:      82.500 cm/s AV Area (VTI):     2.64 cm      PV VTI:        0.187 m AV Vmax:           159.00 cm/s   PV Peak grad:  5.0 mmHg AV Vmean:          113.000 cm/s  PV Mean grad:  3.0 mmHg AV VTI:            0.220 m AV Peak Grad:      10.1 mmHg AV Mean Grad:      6.0 mmHg LVOT Vmax:         110.00 cm/s LVOT Vmean:        72.600 cm/s LVOT VTI:          0.168 m LVOT/AV VTI ratio: 0.76  AORTA Ao Root diam: 2.80 cm MITRAL VALVE                TRICUSPID VALVE MV Area (PHT): 5.16 cm     TR Peak grad:   31.4 mmHg MV Area VTI:   2.47 cm     TR Vmax:        280.00 cm/s MV Peak grad:  5.4 mmHg MV Mean grad:  3.0 mmHg     SHUNTS MV Vmax:       1.16 m/s     Systemic VTI:  0.17 m MV Vmean:      77.4 cm/s    Systemic Diam: 2.10 cm MV Decel Time: 147 msec MV E velocity: 124.00 cm/s MV A velocity: 54.40 cm/s MV E/A ratio:  2.28 Ida Rogue MD Electronically signed by Ida Rogue MD Signature Date/Time: 06/01/2021/6:41:43 PM    Final     ECHOCARDIOGRAM LIMITED  Result Date: 06/06/2021    ECHOCARDIOGRAM LIMITED REPORT   Patient Name:   BENARD MINTURN Date of Exam: 06/05/2021 Medical Rec #:  [id number]       Height:       68.0 in Accession #:    [id number]      Weight:       152.1 lb Date of Birth:  [birth year]       BSA:          1.819 m Patient Age:    25 years        BP:           102/80 mmHg Patient Gender: M               HR:           56 bpm. Exam Location:  [facility] Procedure: 2D Echo, Color  Doppler and Cardiac Doppler Indications:     CHF I50.9  History:         Patient has prior history of Echocardiogram examinations, most                  recent 06/01/2021. Risk Factors:Diabetes.  Sonographer:     Sherrie Sport Referring Phys:  [id number] ADAM ROSS SCHERTZ Diagnosing Phys: Ida Rogue MD  Sonographer Comments: Echo performed with patient supine and on artificial respirator. IMPRESSIONS  1. Left ventricular ejection fraction, by estimation, is 35 to 40%. The left ventricle has moderately decreased function. The left ventricle demonstrates global hypokinesis. There is mild left ventricular hypertrophy. Left ventricular diastolic parameters are indeterminate.  2. Right ventricular systolic function is mildly reduced. The right ventricular size is normal. There is normal pulmonary artery systolic pressure. The estimated right ventricular systolic pressure is 03.5 mmHg.  3. The mitral valve is normal in structure. Mild mitral valve regurgitation. No evidence of mitral stenosis.  4. Tricuspid valve regurgitation is mild to moderate.  5. The aortic valve has an indeterminant number of cusps. Aortic valve regurgitation is mild. No aortic stenosis is present.  6. The inferior vena cava is normal in size with greater than 50% respiratory variability, suggesting right atrial pressure of 3 mmHg. FINDINGS  Left Ventricle: Left ventricular ejection fraction, by estimation, is 35 to 40%. The left ventricle has moderately decreased function. The left ventricle  demonstrates global hypokinesis. The left ventricular internal cavity size was normal in size. There is mild left ventricular hypertrophy. Left ventricular diastolic parameters are indeterminate. Right Ventricle: The right ventricular size is normal. No increase in right ventricular wall thickness. Right ventricular systolic function is mildly reduced. There is normal pulmonary artery systolic pressure. The tricuspid regurgitant velocity is 2.58 m/s, and with an assumed right atrial pressure of 5 mmHg, the estimated right ventricular systolic pressure is 00.9 mmHg. Left Atrium: Left atrial size was normal in size. Right Atrium: Right atrial size was normal in size. Pericardium: There is no evidence of pericardial effusion. Mitral Valve: The mitral valve is normal in structure. Mild mitral valve regurgitation. No evidence of mitral valve stenosis. Tricuspid Valve: The tricuspid valve is normal in structure. Tricuspid valve regurgitation is mild to moderate. No evidence of tricuspid stenosis. Aortic Valve: The aortic valve has an indeterminant number of cusps. Aortic valve regurgitation is mild. No aortic stenosis is present. Aortic valve mean gradient measures 3.5 mmHg. Aortic valve peak gradient measures 7.1 mmHg. Aortic valve area, by VTI measures 2.90 cm. Pulmonic Valve: The pulmonic valve was normal in structure. Pulmonic valve regurgitation is not visualized. No evidence of pulmonic stenosis. Aorta: The aortic root is normal in size and structure. Venous: The inferior vena cava is normal in size with greater than 50% respiratory variability, suggesting right atrial pressure of 3 mmHg. IAS/Shunts: No atrial level shunt detected by color flow Doppler. LEFT VENTRICLE PLAX 2D LVIDd:         4.60 cm LVIDs:         4.00 cm LV PW:         1.60 cm LV IVS:        0.90 cm LVOT diam:     2.20 cm LV SV:         39 LV SV Index:   21 LVOT Area:     3.80 cm  RIGHT VENTRICLE RV Basal diam:  3.90 cm RV S prime:     11.00 cm/s  TAPSE (  M-mode): 1.8 cm LEFT ATRIUM             Index        RIGHT ATRIUM           Index LA diam:        3.40 cm 1.87 cm/m   RA Area:     13.20 cm LA Vol (A2C):   49.2 ml 27.04 ml/m  RA Volume:   29.90 ml  16.43 ml/m LA Vol (A4C):   31.9 ml 17.53 ml/m LA Biplane Vol: 41.9 ml 23.03 ml/m  AORTIC VALVE                    PULMONIC VALVE AV Area (Vmax):    2.38 cm     PV Vmax:        0.57 m/s AV Area (Vmean):   2.50 cm     PV Vmean:       35.800 cm/s AV Area (VTI):     2.90 cm     PV VTI:         0.060 m AV Vmax:           133.50 cm/s  PV Peak grad:   1.3 mmHg AV Vmean:          83.900 cm/s  PV Mean grad:   1.0 mmHg AV VTI:            0.134 m      RVOT Peak grad: 4 mmHg AV Peak Grad:      7.1 mmHg AV Mean Grad:      3.5 mmHg LVOT Vmax:         83.60 cm/s LVOT Vmean:        55.100 cm/s LVOT VTI:          0.102 m LVOT/AV VTI ratio: 0.76  AORTA Ao Root diam: 2.93 cm MITRAL VALVE                TRICUSPID VALVE MV Area (PHT): 3.65 cm     TR Peak grad:   26.6 mmHg MV Decel Time: 208 msec     TR Vmax:        258.00 cm/s MV E velocity: 101.00 cm/s                             SHUNTS                             Systemic VTI:  0.10 m                             Systemic Diam: 2.20 cm                             Pulmonic VTI:  0.115 m Ida Rogue MD Electronically signed by Ida Rogue MD Signature Date/Time: 06/06/2021/1:23:45 PM    Final    CT CHEST ABDOMEN PELVIS WO CONTRAST  Result Date: 06/04/2021 CLINICAL DATA:  Sepsis. EXAM: CT CHEST, ABDOMEN AND PELVIS WITHOUT CONTRAST TECHNIQUE: Multidetector CT imaging of the chest, abdomen and pelvis was performed following the standard protocol without IV contrast. RADIATION DOSE REDUCTION: This exam was performed according to the departmental dose-optimization program which includes automated exposure control, adjustment of the mA and/or kV according to patient size  and/or use of iterative reconstruction technique. COMPARISON:  Chest CT dated 05/31/2021. FINDINGS:  Evaluation of this exam is limited in the absence of intravenous contrast. Evaluation is also limited due to streak artifact caused by patient's arms as well as anasarca. CT CHEST FINDINGS Cardiovascular: Top-normal cardiac size. No pericardial effusion. There is hypoattenuation of the cardiac blood pool suggestive of anemia. Clinical correlation is recommended. Mild atherosclerotic calcification of the aortic arch. The central pulmonary arteries are grossly unremarkable. Mediastinum/Nodes: Evaluation of the hilar and mediastinal structure is very limited due to factors above. No mediastinal fluid collection. Lungs/Pleura: Small bilateral pleural effusions. There are large areas of consolidative change involving the lower lobes, right greater left. There is near complete consolidation of the right lower lobe. There is impaction of the right lower lobe bronchus as well as impaction of the left lower lobe bronchi which may represent aspiration. No pneumothorax. Tracheostomy with tip approximately 3 cm above the carina. Musculoskeletal: Diffuse chest wall edema and anasarca. No acute osseous pathology. Old healed right posterior rib fracture. CT ABDOMEN PELVIS FINDINGS No intra-abdominal free air.  Small ascites. Hepatobiliary: The liver is unremarkable. Mild periportal edema. Probable gallbladder sludge. No calcified gallstone. Pancreas: Unremarkable. No pancreatic ductal dilatation or surrounding inflammatory changes. Spleen: Normal in size without focal abnormality. Adrenals/Urinary Tract: The adrenal glands are unremarkable. There is no hydronephrosis or nephrolithiasis on either side. The urinary bladder is decompressed around a Foley catheter. Stomach/Bowel: A rectal tube is in place. No bowel obstruction. The appendix is unremarkable as visualized. Vascular/Lymphatic: The abdominal aorta and IVC are grossly unremarkable. Right femoral approach venous line with tip in the IVC inferior to the renal vein. Left  femoral approach venous line with tip in the left common iliac vein. Right femoral arterial line with tip in the right external iliac artery. No portal venous gas. No adenopathy. Reproductive: The prostate and seminal vesicles are grossly unremarkable. Other: Diffuse subcutaneous edema and anasarca. Musculoskeletal: No acute or significant osseous findings. IMPRESSION: 1. Impacted bilateral lower lobe bronchi with large areas of consolidation involving the lower lobes with near complete consolidation the right lower lobe, new since the prior CT. Findings likely represent postobstructive atelectasis or pneumonia. Aspiration is not excluded. Small bilateral pleural effusions also new since the prior CT. 2. Small ascites and anasarca. 3. Aortic Atherosclerosis (ICD10-I70.0). Electronically Signed   By: Anner Crete M.D.   On: 06/04/2021 00:07    Microbiology: Results for orders placed or performed during the hospital encounter of 05/31/21  Resp Panel by RT-PCR (Flu A&B, Covid) Nasopharyngeal Swab     Status: None   Collection Time: 05/31/21  6:58 AM   Specimen: Nasopharyngeal Swab; Nasopharyngeal(NP) swabs in vial transport medium  Result Value Ref Range Status   SARS Coronavirus 2 by RT PCR NEGATIVE NEGATIVE Final    Comment: (NOTE) SARS-CoV-2 target nucleic acids are NOT DETECTED.  The SARS-CoV-2 RNA is generally detectable in upper respiratory specimens during the acute phase of infection. The lowest concentration of SARS-CoV-2 viral copies this assay can detect is 138 copies/mL. A negative result does not preclude SARS-Cov-2 infection and should not be used as the sole basis for treatment or other patient management decisions. A negative result may occur with  improper specimen collection/handling, submission of specimen other than nasopharyngeal swab, presence of viral mutation(s) within the areas targeted by this assay, and inadequate number of viral copies(<138 copies/mL). A negative  result must be combined with clinical observations, patient history, and epidemiological information. The  expected result is Negative.  Fact Sheet for Patients:  EntrepreneurPulse.com.au  Fact Sheet for Healthcare Providers:  IncredibleEmployment.be  This test is no t yet approved or cleared by the [place] FDA and  has been authorized for detection and/or diagnosis of SARS-CoV-2 by FDA under an Emergency Use Authorization (EUA). This EUA will remain  in effect (meaning this test can be used) for the duration of the COVID-19 declaration under Section 564(b)(1) of the Act, 21 U.S.C.section 360bbb-3(b)(1), unless the authorization is terminated  or revoked sooner.       Influenza A by PCR NEGATIVE NEGATIVE Final   Influenza B by PCR NEGATIVE NEGATIVE Final    Comment: (NOTE) The Xpert Xpress SARS-CoV-2/FLU/RSV plus assay is intended as an aid in the diagnosis of influenza from Nasopharyngeal swab specimens and should not be used as a sole basis for treatment. Nasal washings and aspirates are unacceptable for Xpert Xpress SARS-CoV-2/FLU/RSV testing.  Fact Sheet for Patients: EntrepreneurPulse.com.au  Fact Sheet for Healthcare Providers: IncredibleEmployment.be  This test is not yet approved or cleared by the [place] FDA and has been authorized for detection and/or diagnosis of SARS-CoV-2 by FDA under an Emergency Use Authorization (EUA). This EUA will remain in effect (meaning this test can be used) for the duration of the COVID-19 declaration under Section 564(b)(1) of the Act, 21 U.S.C. section 360bbb-3(b)(1), unless the authorization is terminated or revoked.  Performed at [facility], [place], [place] [address]   Respiratory (~20 pathogens) panel by PCR     Status: None   Collection Time: 05/31/21  6:58 AM   Specimen: Nasopharyngeal Swab; Respiratory  Result  Value Ref Range Status   Adenovirus NOT DETECTED NOT DETECTED Final   Coronavirus 229E NOT DETECTED NOT DETECTED Final    Comment: (NOTE) The Coronavirus on the Respiratory Panel, DOES NOT test for the novel  Coronavirus (2019 nCoV)    Coronavirus HKU1 NOT DETECTED NOT DETECTED Final   Coronavirus NL63 NOT DETECTED NOT DETECTED Final   Coronavirus OC43 NOT DETECTED NOT DETECTED Final   Metapneumovirus NOT DETECTED NOT DETECTED Final   Rhinovirus / Enterovirus NOT DETECTED NOT DETECTED Final   Influenza A NOT DETECTED NOT DETECTED Final   Influenza B NOT DETECTED NOT DETECTED Final   Parainfluenza Virus 1 NOT DETECTED NOT DETECTED Final   Parainfluenza Virus 2 NOT DETECTED NOT DETECTED Final   Parainfluenza Virus 3 NOT DETECTED NOT DETECTED Final   Parainfluenza Virus 4 NOT DETECTED NOT DETECTED Final   Respiratory Syncytial Virus NOT DETECTED NOT DETECTED Final   Bordetella pertussis NOT DETECTED NOT DETECTED Final   Bordetella Parapertussis NOT DETECTED NOT DETECTED Final   Chlamydophila pneumoniae NOT DETECTED NOT DETECTED Final   Mycoplasma pneumoniae NOT DETECTED NOT DETECTED Final    Comment: Performed at [facility], [place]. [address], [place], [place] [address]  Group A Strep by PCR [facility] Only)     Status: None   Collection Time: 05/31/21  8:35 AM   Specimen: Throat; Sterile Swab  Result Value Ref Range Status   Group A Strep by PCR NOT DETECTED NOT DETECTED Final    Comment: Performed at [facility], [address] [place], [place] [address]  Blood culture (routine x 2)     Status: None   Collection Time: 05/31/21  8:39 AM   Specimen: BLOOD  Result Value Ref Range Status   Specimen Description BLOOD BLOOD RIGHT FOREARM  Final   Special Requests  Final    BOTTLES DRAWN AEROBIC AND ANAEROBIC Blood Culture results may not be optimal due to an excessive volume of blood received in culture bottles   Culture   Final    NO GROWTH 5 DAYS Performed at [facility], [address] [place], [place] [address]    Report Status 06/05/2021 FINAL  Final  Blood culture (routine x 2)     Status: None   Collection Time: 05/31/21  8:39 AM   Specimen: BLOOD  Result Value Ref Range Status   Specimen Description BLOOD RIGHT ANTECUBITAL  Final   Special Requests   Final    BOTTLES DRAWN AEROBIC AND ANAEROBIC Blood Culture results may not be optimal due to an excessive volume of blood received in culture bottles   Culture   Final    NO GROWTH 5 DAYS Performed at [facility], [address] [place], [place] [address]    Report Status 06/05/2021 FINAL  Final  C Difficile Quick Screen w PCR reflex     Status: None   Collection Time: 05/31/21  1:20 PM   Specimen: Stool  Result Value Ref Range Status   C Diff antigen NEGATIVE NEGATIVE Final   C Diff toxin NEGATIVE NEGATIVE Final   C Diff interpretation No C. difficile detected.  Final    Comment: Performed at [facility], [address] [place], [place] [address]  Gastrointestinal Panel by PCR , Stool     Status: None   Collection Time: 05/31/21  1:30 PM   Specimen: Stool  Result Value Ref Range Status   Campylobacter species NOT DETECTED NOT DETECTED Final   Plesimonas shigelloides NOT DETECTED NOT DETECTED Final   Salmonella species NOT DETECTED NOT DETECTED Final   Yersinia enterocolitica NOT DETECTED NOT DETECTED Final   Vibrio species NOT DETECTED NOT DETECTED Final   Vibrio cholerae NOT DETECTED NOT DETECTED Final   Enteroaggregative E coli (EAEC) NOT DETECTED NOT DETECTED Final   Enteropathogenic E coli (EPEC) NOT DETECTED NOT DETECTED Final   Enterotoxigenic E coli (ETEC) NOT DETECTED NOT DETECTED Final   Shiga like toxin producing E coli (STEC) NOT DETECTED NOT DETECTED Final   Shigella/Enteroinvasive E coli (EIEC) NOT DETECTED NOT DETECTED Final   Cryptosporidium NOT DETECTED NOT DETECTED Final   Cyclospora cayetanensis NOT DETECTED NOT DETECTED Final    Entamoeba histolytica NOT DETECTED NOT DETECTED Final   Giardia lamblia NOT DETECTED NOT DETECTED Final   Adenovirus F40/41 NOT DETECTED NOT DETECTED Final   Astrovirus NOT DETECTED NOT DETECTED Final   Norovirus GI/GII NOT DETECTED NOT DETECTED Final   Rotavirus A NOT DETECTED NOT DETECTED Final   Sapovirus (I, II, IV, and V) NOT DETECTED NOT DETECTED Final    Comment: Performed at [facility], [address], [place], [place] [address]  Urine Culture     Status: None   Collection Time: 06/01/21 10:23 AM   Specimen: In/Out Cath Urine  Result Value Ref Range Status   Specimen Description   Final    IN/OUT CATH URINE Performed at [facility], [address], [place], [place] [address]    Special Requests   Final    NONE Performed at [facility], [address], [place], [place] [address]    Culture   Final    NO GROWTH Performed at [facility], [address], [place], [place] [address]    Report Status 06/03/2021 FINAL  Final  Culture, Respiratory  w Gram Stain     Status: None   Collection Time: 06/01/21  3:59 PM   Specimen: Tracheal Aspirate; Respiratory  Result Value Ref Range Status   Specimen Description   Final    TRACHEAL ASPIRATE Performed at [facility], [address], [place], [place] [address]    Special Requests   Final    NONE Performed at [facility], [place], [place] [address]    Gram Stain   Final    RARE SQUAMOUS EPITHELIAL CELLS PRESENT MODERATE WBC PRESENT,BOTH PMN AND MONONUCLEAR MODERATE GRAM POSITIVE COCCI FEW GRAM NEGATIVE RODS FEW GRAM POSITIVE RODS RARE YEAST    Culture   Final    FEW Consistent with normal respiratory flora. No Pseudomonas species isolated Performed at [facility] [address] [place], [place] [address]    Report Status 06/03/2021 FINAL  Final  CULTURE, BLOOD (ROUTINE X 2) w Reflex to ID Panel     Status: None   Collection Time:  06/01/21  7:06 PM   Specimen: BLOOD  Result Value Ref Range Status   Specimen Description BLOOD RIGHT ANTECUBITAL  Final   Special Requests   Final    BOTTLES DRAWN AEROBIC ONLY Blood Culture results may not be optimal due to an inadequate volume of blood received in culture bottles   Culture   Final    NO GROWTH 5 DAYS Performed at [facility], [address] [place], [place] [address]    Report Status 06/06/2021 FINAL  Final  CULTURE, BLOOD (ROUTINE X 2) w Reflex to ID Panel     Status: None   Collection Time: 06/01/21  7:37 PM   Specimen: BLOOD  Result Value Ref Range Status   Specimen Description BLOOD [facility]  Final   Special Requests BOTTLES DRAWN AEROBIC AND ANAEROBIC BCLV  Final   Culture   Final    NO GROWTH 5 DAYS Performed at [facility], [address], [place], [place] [address]    Report Status 06/06/2021 FINAL  Final  MRSA Next Gen by PCR, Nasal     Status: None   Collection Time: 06/02/21 10:46 AM   Specimen: Nasal Mucosa; Nasal Swab  Result Value Ref Range Status   MRSA by PCR Next Gen NOT DETECTED NOT DETECTED Final    Comment: (NOTE) The GeneXpert MRSA Assay (FDA approved for NASAL specimens only), is one component of a comprehensive MRSA colonization surveillance program. It is not intended to diagnose MRSA infection nor to guide or monitor treatment for MRSA infections. Test performance is not FDA approved in patients less than 14 years old. Performed at [facility], [address] [place], [place] [address]   Culture, Respiratory w Gram Stain     Status: None   Collection Time: 06/04/21  7:38 AM   Specimen: Tracheal Aspirate; Respiratory  Result Value Ref Range Status   Specimen Description   Final    TRACHEAL ASPIRATE Performed at [facility], [address] [place], [place] [address]    Special Requests   Final    NONE Performed at [facility], [address] [place], [place] [address]     Gram Stain   Final    MODERATE WBC PRESENT,BOTH PMN AND MONONUCLEAR NO ORGANISMS SEEN Performed at [facility], [place] [address], [place], [place] [address]    Culture FEW CANDIDA ALBICANS RARE ESCHERICHIA COLI   Final   Report Status 06/07/2021  FINAL  Final   Organism ID, Bacteria ESCHERICHIA COLI  Final      Susceptibility   Escherichia coli - MIC*    AMPICILLIN >=32 RESISTANT Resistant     CEFAZOLIN <=4 SENSITIVE Sensitive     CEFEPIME <=0.12 SENSITIVE Sensitive     CEFTAZIDIME <=1 SENSITIVE Sensitive     CEFTRIAXONE <=0.25 SENSITIVE Sensitive     CIPROFLOXACIN <=0.25 SENSITIVE Sensitive     GENTAMICIN <=1 SENSITIVE Sensitive     IMIPENEM <=0.25 SENSITIVE Sensitive     TRIMETH/SULFA >=320 RESISTANT Resistant     AMPICILLIN/SULBACTAM >=32 RESISTANT Resistant     PIP/TAZO <=4 SENSITIVE Sensitive     * RARE ESCHERICHIA COLI  CULTURE, BLOOD (ROUTINE X 2) w Reflex to ID Panel     Status: None   Collection Time: 06/04/21  6:35 PM   Specimen: BLOOD  Result Value Ref Range Status   Specimen Description BLOOD BLOOD LEFT HAND  Final   Special Requests   Final    BOTTLES DRAWN AEROBIC AND ANAEROBIC Blood Culture adequate volume   Culture   Final    NO GROWTH 5 DAYS Performed at [facility], [address] [place], [place] [address]    Report Status 06/09/2021 FINAL  Final    Labs: CBC: Recent Labs  Lab 06/15/21 0402 06/16/21 0509 06/17/21 0613 06/19/21 0905 06/20/21 0437  WBC 8.2 7.0 6.3 9.2 10.1  HGB 8.8* 8.1* 8.1* 7.9* 7.4*  HCT 26.2* 24.4* 25.2* 25.0* 23.0*  MCV 87.3 88.7 91.6 92.6 93.1  PLT 58* 63* 58* 70* 70*   Basic Metabolic Panel: Recent Labs  Lab 06/14/21 0346 06/15/21 0402 06/16/21 0543 06/17/21 0613 06/19/21 0905 06/20/21 0437  NA 130* 136 134* 135 138 136  K 4.3 4.3 4.9 4.4 4.8 4.5  CL 98 105 104 104 106 104  CO2 _0 GLUCOSE 267* 146* 207* 172* 98 90  BUN 68* 51* 60* 38* 50* 30*  CREATININE 3.54* 3.33* 4.31* 3.43*  5.03* 3.50*  CALCIUM 6.3* 6.5* 6.3* 6.6* 6.7* 6.9*  MG 2.6*  --  2.3  --   --   --   PHOS 4.3  --  4.5 3.8 5.2* 4.4   Liver Function Tests: Recent Labs  Lab 06/14/21 0346 06/16/21 0543 06/17/21 0613 06/19/21 0905 06/20/21 0437  AST  --   --   --   --  14*  ALT  --   --   --   --  5  ALKPHOS  --   --   --   --  133*  BILITOT  --   --   --   --  0.9  PROT  --   --   --   --  4.9*  ALBUMIN 1.7* 1.6* 1.7* 1.8* 1.6*   1.6*   CBG: Recent Labs  Lab 06/20/21 0713 06/20/21 0841 06/20/21 1040 06/20/21 1156 06/20/21 1646  GLUCAP 52* 62* 112* 104* 223*    Discharge time spent: greater than 30 minutes.  Signed: Nita Sells, MD [facility] 06/20/2021

## 2021-06-20 NOTE — Progress Notes (Signed)
Patient financially cleared for outpatient dialysis.

## 2021-06-20 NOTE — Plan of Care (Signed)
  Problem: Health Behavior/Discharge Planning: Goal: Ability to manage health-related needs will improve Outcome: Progressing   

## 2021-06-20 NOTE — Progress Notes (Signed)
Physical Therapy Treatment Patient Details Name: Jonathan Mcmillan MRN: [id number] DOB: March 24, 1996 Today's Date: 06/20/2021   History of Present Illness Pt is a 26 y/o M admitted on 06/01/21 with AKI on CKD in setting of viral syndrome & diarrhea. Pt suffered in-hospital cardiac arrest, suspect due to severe metabolic acidosis & multiple metabolic derangements. Required CRRT and now preparing to undergo regular HD with R temp femoral catheter placed on 1/28. Pt was extubated on 1/21 but reintubated 1/22. Extubated again on 1/26 & now weaned to room air. PMH: DM2, CKD stage IV, HTN, nicotine dependence.    PT Comments    Per RN, pt's R temporary femoral catheter removed and was on bed rest for 1 hour per protocol. Upon entry to room pt agreeable to PT services. Tolerating bed level therex well with need for AAROM for heel slides today. Overall pt remains needing assist and bed features to attain sitting EOB and modA+1 or minA+2 to stand to RW with bed at lowest point with mod to max VC's for correct hand placement with poor carryover.  Stand step transfer performed to edge of recliner however pt endorsing feeling well and wanting to ambulate. OT present at this point to assist in chair follow for safety. Pt tolerating initially CGA to supervision with ambulating straight line with RW with step through pattern however slow and cautious with limited foot clearance and hip extension bilaterally. Pt remains SOB at end of bout with seated rest provided with very poor eccentric control requiring minA+2 for lowering to seated position in recliner. HR and SPO2 WNL. After seated rest pt requiring mod VC's and education for set up to assist in standing requiring minA+2 to stand to RW with limited carryover with hand placement. Pt does have LOB with turns requiring minA to correct. Pt returned to room with all needs in place seated in recliner for OT treatment. Pt displays poor endurance with OOB mobility, weakness as  evidenced with STS and stand to sit transfers, and poor safety awareness/safe use of LRAD for OOB mobility thus pt will benefit from CIR to return to independence with ADL's/IADL's and ambulatory tasks.    Recommendations for follow up therapy are one component of a multi-disciplinary discharge planning process, led by the attending physician.  Recommendations may be updated based on patient status, additional functional criteria and insurance authorization.  Follow Up Recommendations  Acute inpatient rehab (3hours/day)     Assistance Recommended at Discharge Frequent or constant Supervision/Assistance  Patient can return home with the following Two people to help with walking and/or transfers;Two people to help with bathing/dressing/bathroom;Direct supervision/assist for medications management;Help with stairs or ramp for entrance;Assistance with feeding;Assist for transportation;Assistance with cooking/housework;Direct supervision/assist for financial management   Equipment Recommendations  None recommended by PT    Recommendations for Other Services       Precautions / Restrictions Precautions Precautions: Fall Precaution Comments: R temporary femoral dialysis catheter removed 06/20/21. Per RN is ok to mobilize after 11 am. Restrictions Weight Bearing Restrictions: No     Mobility  Bed Mobility Overal bed mobility: Needs Assistance Bed Mobility: Supine to Sit     Supine to sit: Min assist, HOB elevated     General bed mobility comments: Assist for trunk Patient Response: Cooperative, Flat affect  Transfers Overall transfer level: Needs assistance Equipment used: Rolling walker (2 wheels) Transfers: Bed to chair/wheelchair/BSC   Stand pivot transfers: Mod assist         General transfer comment: ModA with  bed in lowered position. Mod VC's for hand placement with poor carryover.    Ambulation/Gait Ambulation/Gait assistance: Min guard Gait Distance (Feet): 110  Feet Assistive device: Rolling walker (2 wheels) Gait Pattern/deviations: Step-through pattern, Drifts right/left, Narrow base of support, Trunk flexed       General Gait Details: Chair follow provided for safety. Slow and cautious step through pattern with heavy UE reliance on RW.   Stairs             Wheelchair Mobility    Modified Rankin (Stroke Patients Only)       Balance Overall balance assessment: Needs assistance Sitting-balance support: No upper extremity supported, Feet supported Sitting balance-Leahy Scale: Fair     Standing balance support: Bilateral upper extremity supported, During functional activity, Reliant on assistive device for balance Standing balance-Leahy Scale: Fair Standing balance comment: Fair with use of RW for support                            Cognition Arousal/Alertness: Awake/alert   Overall Cognitive Status: Within Functional Limits for tasks assessed                                          Exercises General Exercises - Lower Extremity Ankle Circles/Pumps: AROM, Supine, Strengthening, Both, 15 reps Quad Sets: AROM, Strengthening, Both, 10 reps, Supine Heel Slides: AAROM, Strengthening, Left, 10 reps, Supine, Right Hip ABduction/ADduction: AROM, Strengthening, Both, 10 reps, Supine Other Exercises Other Exercises: Safe use of hands and RW for transfers    General Comments General comments (skin integrity, edema, etc.): HR and SPO2 WNL with ambulation      Pertinent Vitals/Pain Pain Assessment Pain Assessment: No/denies pain    Home Living                          Prior Function            PT Goals (current goals can now be found in the care plan section) Acute Rehab PT Goals Patient Stated Goal: none stated PT Goal Formulation: With patient Time For Goal Achievement: 06/24/21 Potential to Achieve Goals: Good Progress towards PT goals: Progressing toward goals     Frequency    Min 2X/week      PT Plan Current plan remains appropriate    Co-evaluation              AM-PAC PT "6 Clicks" Mobility   Outcome Measure  Help needed turning from your back to your side while in a flat bed without using bedrails?: A Little Help needed moving from lying on your back to sitting on the side of a flat bed without using bedrails?: A Little Help needed moving to and from a bed to a chair (including a wheelchair)?: A Little Help needed standing up from a chair using your arms (e.g., wheelchair or bedside chair)?: A Lot Help needed to walk in hospital room?: A Little Help needed climbing 3-5 steps with a railing? : Total 6 Click Score: 15    End of Session Equipment Utilized During Treatment: Gait belt Activity Tolerance: Patient tolerated treatment well Patient left: in chair;with call bell/phone within reach;with chair alarm set;Other (comment) (in care of OT) Nurse Communication: Mobility status PT Visit Diagnosis: Difficulty in walking, not elsewhere classified (R26.2);Muscle weakness (generalized) (M62.81)  Time: 4268-3419 PT Time Calculation (min) (ACUTE ONLY): 19 min  Charges:  $Therapeutic Exercise: 8-22 mins                     Salem Caster. Fairly IV, PT, DPT Physical Therapist- [facility]  06/20/2021, 11:58 AM

## 2021-06-20 NOTE — Progress Notes (Signed)
[facility]  ROUNDING NOTE   Subjective:   Patient sitting up in bed, alert and oriented Tolerating meals Denies shortness of breath Denies pain and discomfort Requesting rectal tube be removed  Dialysis received yesterday, tolerated well  Objective:  Vital signs in last 24 hours:  Temp:  [97.5 F (36.4 C)-98.7 F (37.1 C)] 98.1 F (36.7 C) (02/07 0715) Pulse Rate:  [68-91] 85 (02/07 0715) Resp:  [0-18] 16 (02/07 0715) BP: (101-144)/(62-91) 131/82 (02/07 0715) SpO2:  [98 %-100 %] 100 % (02/07 0715) Weight:  [70.3 kg-71.1 kg] 70.3 kg (02/06 1817)  Weight change:  Filed Weights   06/19/21 1036 06/19/21 1328 06/19/21 1817  Weight: 66.9 kg 71.1 kg 70.3 kg    Intake/Output: I/O last 3 completed shifts: In: 850 [P.O.:850] Out: 3002 [Other:1002; UPJSR:1594]   Intake/Output this shift:  No intake/output data recorded.  Physical Exam: General: No acute distress  Eyes: Anicteric  Lungs:  Diminished in bases normal effort  Heart: S1S2, no rubs  Abdomen:  Soft, nontender, BS present  Extremities: No peripheral edema.  Neurologic: Awake, alert, no rbus  Skin: Peeling skin noted  Access:  Rt permcath placed on S[place] on 06/15/21, exchanged on 06/19/21 by Dr Lucky Cowboy  Rectal tube, external catheter   Basic Metabolic Panel: Recent Labs  Lab 06/14/21 0346 06/15/21 0402 06/16/21 0543 06/17/21 0613 06/19/21 0905 06/20/21 0437  NA 130* 136 134* 135 138 136  K 4.3 4.3 4.9 4.4 4.8 4.5  CL 98 105 104 104 106 104  CO2 22 24 23 24 24 25   GLUCOSE 267* 146* 207* 172* 98 90  BUN 68* 51* 60* 38* 50* 30*  CREATININE 3.54* 3.33* 4.31* 3.43* 5.03* 3.50*  CALCIUM 6.3* 6.5* 6.3* 6.6* 6.7* 6.9*  MG 2.6*  --  2.3  --   --   --   PHOS 4.3  --  4.5 3.8 5.2* 4.4     Liver Function Tests: Recent Labs  Lab 06/14/21 0346 06/16/21 0543 06/17/21 0613 06/19/21 0905 06/20/21 0437  AST  --   --   --   --  14*  ALT  --   --   --   --  5  ALKPHOS  --   --   --   --  133*   BILITOT  --   --   --   --  0.9  PROT  --   --   --   --  4.9*  ALBUMIN 1.7* 1.6* 1.7* 1.8* 1.6*   1.6*    No results for input(s): LIPASE, AMYLASE in the last 168 hours.  No results for input(s): AMMONIA in the last 168 hours.   CBC: Recent Labs  Lab 06/15/21 0402 06/16/21 0509 06/17/21 0613 06/19/21 0905 06/20/21 0437  WBC 8.2 7.0 6.3 9.2 10.1  HGB 8.8* 8.1* 8.1* 7.9* 7.4*  HCT 26.2* 24.4* 25.2* 25.0* 23.0*  MCV 87.3 88.7 91.6 92.6 93.1  PLT 58* 63* 58* 70* 70*     Cardiac Enzymes: No results for input(s): CKTOTAL, CKMB, CKMBINDEX, TROPONINI in the last 168 hours.   BNP: Invalid input(s): POCBNP  CBG: Recent Labs  Lab 06/19/21 1842 06/19/21 2124 06/20/21 0713 06/20/21 0841 06/20/21 1040  GLUCAP 144* 108* 52* 62* 112*     Microbiology: Results for orders placed or performed during the hospital encounter of 05/31/21  Resp Panel by RT-PCR (Flu A&B, Covid) Nasopharyngeal Swab     Status: None   Collection Time: 05/31/21  6:58 AM  Specimen: Nasopharyngeal Swab; Nasopharyngeal(NP) swabs in vial transport medium  Result Value Ref Range Status   SARS Coronavirus 2 by RT PCR NEGATIVE NEGATIVE Final    Comment: (NOTE) SARS-CoV-2 target nucleic acids are NOT DETECTED.  The SARS-CoV-2 RNA is generally detectable in upper respiratory specimens during the acute phase of infection. The lowest concentration of SARS-CoV-2 viral copies this assay can detect is 138 copies/mL. A negative result does not preclude SARS-Cov-2 infection and should not be used as the sole basis for treatment or other patient management decisions. A negative result may occur with  improper specimen collection/handling, submission of specimen other than nasopharyngeal swab, presence of viral mutation(s) within the areas targeted by this assay, and inadequate number of viral copies(<138 copies/mL). A negative result must be combined with clinical observations, patient history, and  epidemiological information. The expected result is Negative.  Fact Sheet for Patients:  EntrepreneurPulse.com.au  Fact Sheet for Healthcare Providers:  IncredibleEmployment.be  This test is no t yet approved or cleared by the [place] FDA and  has been authorized for detection and/or diagnosis of SARS-CoV-2 by FDA under an Emergency Use Authorization (EUA). This EUA will remain  in effect (meaning this test can be used) for the duration of the COVID-19 declaration under Section 564(b)(1) of the Act, 21 U.S.C.section 360bbb-3(b)(1), unless the authorization is terminated  or revoked sooner.       Influenza A by PCR NEGATIVE NEGATIVE Final   Influenza B by PCR NEGATIVE NEGATIVE Final    Comment: (NOTE) The Xpert Xpress SARS-CoV-2/FLU/RSV plus assay is intended as an aid in the diagnosis of influenza from Nasopharyngeal swab specimens and should not be used as a sole basis for treatment. Nasal washings and aspirates are unacceptable for Xpert Xpress SARS-CoV-2/FLU/RSV testing.  Fact Sheet for Patients: EntrepreneurPulse.com.au  Fact Sheet for Healthcare Providers: IncredibleEmployment.be  This test is not yet approved or cleared by the [place] FDA and has been authorized for detection and/or diagnosis of SARS-CoV-2 by FDA under an Emergency Use Authorization (EUA). This EUA will remain in effect (meaning this test can be used) for the duration of the COVID-19 declaration under Section 564(b)(1) of the Act, 21 U.S.C. section 360bbb-3(b)(1), unless the authorization is terminated or revoked.  Performed at [facility], [place], [place] [address]   Respiratory (~20 pathogens) panel by PCR     Status: None   Collection Time: 05/31/21  6:58 AM   Specimen: Nasopharyngeal Swab; Respiratory  Result Value Ref Range Status   Adenovirus NOT DETECTED NOT DETECTED Final    Coronavirus 229E NOT DETECTED NOT DETECTED Final    Comment: (NOTE) The Coronavirus on the Respiratory Panel, DOES NOT test for the novel  Coronavirus (2019 nCoV)    Coronavirus HKU1 NOT DETECTED NOT DETECTED Final   Coronavirus NL63 NOT DETECTED NOT DETECTED Final   Coronavirus OC43 NOT DETECTED NOT DETECTED Final   Metapneumovirus NOT DETECTED NOT DETECTED Final   Rhinovirus / Enterovirus NOT DETECTED NOT DETECTED Final   Influenza A NOT DETECTED NOT DETECTED Final   Influenza B NOT DETECTED NOT DETECTED Final   Parainfluenza Virus 1 NOT DETECTED NOT DETECTED Final   Parainfluenza Virus 2 NOT DETECTED NOT DETECTED Final   Parainfluenza Virus 3 NOT DETECTED NOT DETECTED Final   Parainfluenza Virus 4 NOT DETECTED NOT DETECTED Final   Respiratory Syncytial Virus NOT DETECTED NOT DETECTED Final   Bordetella pertussis NOT DETECTED NOT DETECTED Final   Bordetella Parapertussis NOT DETECTED  NOT DETECTED Final   Chlamydophila pneumoniae NOT DETECTED NOT DETECTED Final   Mycoplasma pneumoniae NOT DETECTED NOT DETECTED Final    Comment: Performed at [facility], [place] [address], [place], [place] [address]  Group A Strep by PCR [facility] Only)     Status: None   Collection Time: 05/31/21  8:35 AM   Specimen: Throat; Sterile Swab  Result Value Ref Range Status   Group A Strep by PCR NOT DETECTED NOT DETECTED Final    Comment: Performed at [facility], [address] [place], [place] [address]  Blood culture (routine x 2)     Status: None   Collection Time: 05/31/21  8:39 AM   Specimen: BLOOD  Result Value Ref Range Status   Specimen Description BLOOD BLOOD RIGHT FOREARM  Final   Special Requests   Final    BOTTLES DRAWN AEROBIC AND ANAEROBIC Blood Culture results may not be optimal due to an excessive volume of blood received in culture bottles   Culture   Final    NO GROWTH 5 DAYS Performed at [facility], [address] [place], [place] [address]    Report  Status 06/05/2021 FINAL  Final  Blood culture (routine x 2)     Status: None   Collection Time: 05/31/21  8:39 AM   Specimen: BLOOD  Result Value Ref Range Status   Specimen Description BLOOD RIGHT ANTECUBITAL  Final   Special Requests   Final    BOTTLES DRAWN AEROBIC AND ANAEROBIC Blood Culture results may not be optimal due to an excessive volume of blood received in culture bottles   Culture   Final    NO GROWTH 5 DAYS Performed at [facility], [address], [place], [place] [address]    Report Status 06/05/2021 FINAL  Final  C Difficile Quick Screen w PCR reflex     Status: None   Collection Time: 05/31/21  1:20 PM   Specimen: Stool  Result Value Ref Range Status   C Diff antigen NEGATIVE NEGATIVE Final   C Diff toxin NEGATIVE NEGATIVE Final   C Diff interpretation No C. difficile detected.  Final    Comment: Performed at [facility], [address] [place], [place] [address]  Gastrointestinal Panel by PCR , Stool     Status: None   Collection Time: 05/31/21  1:30 PM   Specimen: Stool  Result Value Ref Range Status   Campylobacter species NOT DETECTED NOT DETECTED Final   Plesimonas shigelloides NOT DETECTED NOT DETECTED Final   Salmonella species NOT DETECTED NOT DETECTED Final   Yersinia enterocolitica NOT DETECTED NOT DETECTED Final   Vibrio species NOT DETECTED NOT DETECTED Final   Vibrio cholerae NOT DETECTED NOT DETECTED Final   Enteroaggregative E coli (EAEC) NOT DETECTED NOT DETECTED Final   Enteropathogenic E coli (EPEC) NOT DETECTED NOT DETECTED Final   Enterotoxigenic E coli (ETEC) NOT DETECTED NOT DETECTED Final   Shiga like toxin producing E coli (STEC) NOT DETECTED NOT DETECTED Final   Shigella/Enteroinvasive E coli (EIEC) NOT DETECTED NOT DETECTED Final   Cryptosporidium NOT DETECTED NOT DETECTED Final   Cyclospora cayetanensis NOT DETECTED NOT DETECTED Final   Entamoeba histolytica NOT DETECTED NOT DETECTED Final   Giardia lamblia  NOT DETECTED NOT DETECTED Final   Adenovirus F40/41 NOT DETECTED NOT DETECTED Final   Astrovirus NOT DETECTED NOT DETECTED Final   Norovirus GI/GII NOT DETECTED NOT DETECTED Final   Rotavirus A NOT DETECTED  NOT DETECTED Final   Sapovirus (I, II, IV, and V) NOT DETECTED NOT DETECTED Final    Comment: Performed at [facility], [address], [place], [place] [address]  Urine Culture     Status: None   Collection Time: 06/01/21 10:23 AM   Specimen: In/Out Cath Urine  Result Value Ref Range Status   Specimen Description   Final    IN/OUT CATH URINE Performed at [facility], [address] [place], [place] [address]    Special Requests   Final    NONE Performed at [facility], [address], [place], [place] [address]    Culture   Final    NO GROWTH Performed at [facility], [place] [address], [place], [place] [address]    Report Status 06/03/2021 FINAL  Final  Culture, Respiratory w Gram Stain     Status: None   Collection Time: 06/01/21  3:59 PM   Specimen: Tracheal Aspirate; Respiratory  Result Value Ref Range Status   Specimen Description   Final    TRACHEAL ASPIRATE Performed at [facility], [address], [place], [place] [address]    Special Requests   Final    NONE Performed at [facility], [place], [place] [address]    Gram Stain   Final    RARE SQUAMOUS EPITHELIAL CELLS PRESENT MODERATE WBC PRESENT,BOTH PMN AND MONONUCLEAR MODERATE GRAM POSITIVE COCCI FEW GRAM NEGATIVE RODS FEW GRAM POSITIVE RODS RARE YEAST    Culture   Final    FEW Consistent with normal respiratory flora. No Pseudomonas species isolated Performed at [facility] [address] [place], [place] [address]    Report Status 06/03/2021 FINAL  Final  CULTURE, BLOOD (ROUTINE X 2) w Reflex to ID Panel     Status: None   Collection Time: 06/01/21  7:06 PM   Specimen: BLOOD  Result Value Ref Range Status    Specimen Description BLOOD RIGHT ANTECUBITAL  Final   Special Requests   Final    BOTTLES DRAWN AEROBIC ONLY Blood Culture results may not be optimal due to an inadequate volume of blood received in culture bottles   Culture   Final    NO GROWTH 5 DAYS Performed at [facility], [address] [place], [place] [address]    Report Status 06/06/2021 FINAL  Final  CULTURE, BLOOD (ROUTINE X 2) w Reflex to ID Panel     Status: None   Collection Time: 06/01/21  7:37 PM   Specimen: BLOOD  Result Value Ref Range Status   Specimen Description BLOOD [facility]  Final   Special Requests BOTTLES DRAWN AEROBIC AND ANAEROBIC BCLV  Final   Culture   Final    NO GROWTH 5 DAYS Performed at [facility], [address] [place], [place] [address]    Report Status 06/06/2021 FINAL  Final  MRSA Next Gen by PCR, Nasal     Status: None   Collection Time: 06/02/21 10:46 AM   Specimen: Nasal Mucosa; Nasal Swab  Result Value Ref Range Status   MRSA by PCR Next Gen NOT DETECTED NOT DETECTED Final    Comment: (NOTE) The GeneXpert MRSA Assay (FDA approved for NASAL specimens only), is one component of a comprehensive MRSA colonization surveillance program. It is not intended to diagnose MRSA infection nor to guide or monitor treatment for MRSA infections. Test performance is not FDA approved in patients less than 2 years  old. Performed at [facility], [address], [place], [place] [address]   Culture, Respiratory w Gram Stain     Status: None   Collection Time: 06/04/21  7:38 AM   Specimen: Tracheal Aspirate; Respiratory  Result Value Ref Range Status   Specimen Description   Final    TRACHEAL ASPIRATE Performed at [facility], [address] [place], [place] [address]    Special Requests   Final    NONE Performed at [facility], [address] [place], [place] [address]    Gram Stain   Final    MODERATE WBC PRESENT,BOTH PMN AND MONONUCLEAR NO  ORGANISMS SEEN Performed at [facility], [place] [address], [place], [place] [address]    Culture FEW CANDIDA ALBICANS RARE ESCHERICHIA COLI   Final   Report Status 06/07/2021 FINAL  Final   Organism ID, Bacteria ESCHERICHIA COLI  Final      Susceptibility   Escherichia coli - MIC*    AMPICILLIN >=32 RESISTANT Resistant     CEFAZOLIN <=4 SENSITIVE Sensitive     CEFEPIME <=0.12 SENSITIVE Sensitive     CEFTAZIDIME <=1 SENSITIVE Sensitive     CEFTRIAXONE <=0.25 SENSITIVE Sensitive     CIPROFLOXACIN <=0.25 SENSITIVE Sensitive     GENTAMICIN <=1 SENSITIVE Sensitive     IMIPENEM <=0.25 SENSITIVE Sensitive     TRIMETH/SULFA >=320 RESISTANT Resistant     AMPICILLIN/SULBACTAM >=32 RESISTANT Resistant     PIP/TAZO <=4 SENSITIVE Sensitive     * RARE ESCHERICHIA COLI  CULTURE, BLOOD (ROUTINE X 2) w Reflex to ID Panel     Status: None   Collection Time: 06/04/21  6:35 PM   Specimen: BLOOD  Result Value Ref Range Status   Specimen Description BLOOD BLOOD LEFT HAND  Final   Special Requests   Final    BOTTLES DRAWN AEROBIC AND ANAEROBIC Blood Culture adequate volume   Culture   Final    NO GROWTH 5 DAYS Performed at [facility], [address], [place], [place] [address]    Report Status 06/09/2021 FINAL  Final    Coagulation Studies: No results for input(s): LABPROT, INR in the last 72 hours.   Urinalysis: No results for input(s): COLORURINE, LABSPEC, PHURINE, GLUCOSEU, HGBUR, BILIRUBINUR, KETONESUR, PROTEINUR, UROBILINOGEN, NITRITE, LEUKOCYTESUR in the last 72 hours.  Invalid input(s): APPERANCEUR     Imaging: PERIPHERAL VASCULAR CATHETERIZATION  Result Date: 06/19/2021 See surgical note for result.    Medications:    sodium chloride Stopped (06/12/21 2313)    (feeding supplement) PROSource Plus  30 mL Oral TID BM   amiodarone  200 mg Oral Daily   vitamin C  500 mg Oral BID   calcitRIOL  0.5 mcg Oral Daily   calcium carbonate  1 tablet Oral TID    carvedilol  3.125 mg Oral BID WC   chlorhexidine gluconate (MEDLINE KIT)  15 mL Mouth Rinse BID   Chlorhexidine Gluconate Cloth  6 each Topical Daily   Chlorhexidine Gluconate Cloth  6 each Topical Q0600   cinacalcet  30 mg Oral Q breakfast   collagenase   Topical Daily   ferrous sulfate  325 mg Oral Q breakfast   hydrocerin   Topical BID   insulin aspart  0-15 Units Subcutaneous TID WC   mouth rinse  15 mL Mouth Rinse BID   multivitamin  1 tablet Oral QHS   pantoprazole  40 mg Oral Daily   sacubitril-valsartan  1 tablet Oral  BID   sodium chloride flush  10-40 mL Intracatheter Q12H   thiamine  100 mg Oral Daily   zinc sulfate  220 mg Oral Daily   sodium chloride, fentaNYL (SUBLIMAZE) injection, guaiFENesin-dextromethorphan, HYDROmorphone (DILAUDID) injection, ipratropium-albuterol, lip balm, ondansetron (ZOFRAN) IV, ondansetron **OR** [DISCONTINUED] ondansetron (ZOFRAN) IV, oxyCODONE, polyvinyl alcohol, sodium chloride flush  Assessment/ Plan:  Jonathan Mcmillan is a 26 y.o. black male with hypertension, insulin dependent diabetes mellitus type I, diabetic gastroparesis, diabetic neuropathy, tobacco use, THC use, right toe amputation, perirectal abscess, who is admitted to [facility] on 05/31/2021 for SIRS (systemic inflammatory response syndrome) ([place]) [R65.10] AKI (acute kidney injury) ([place]) [N17.9] Symptomatic anemia [D64.9] Acute renal failure superimposed on stage 5 chronic kidney disease, not on chronic dialysis, unspecified acute renal failure type ([place]) [N17.9, N18.5] Hypertension, unspecified type [I10] Acute cough [R05.1]  Cardiac arrest with code blue 06/01/2021 at 10 am. Transferred to ICU. Intubated  End-stage renal disease requiring hemodialysis. Based on patient's history of nephrotic range proteinuria and hematuria and lack of sustainable renal recovery, we feel patient has progressed to end-stage renal disease. Strong family history of dialysis with mother with ESRD before  passing. Was on CRRT.  - Received dialysis yesterday, tolerated well. UF goal 1L removed. Next treatment scheduled for Wednesday. - Appreciate dialysis coordinator confirming outpatient chair time at [facility] on a MWF schedule.  Currently awaiting insurance approval.    Acute respiratory failure Extubated then re-intubated 06/03/2021; extubated 06/08/2021 -Respiratory status stable  Hypotension with cardiogenic shock -Blood pressure 131/82 -Prescribed amiodarone  4. 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 Thrombocytopenia Hematology evaluation on 1/22 Suspected peripheral destruction Platelets remain at 70,000    LOS: [place] 2/7/202311:50 AM

## 2021-06-21 ENCOUNTER — Encounter: Payer: Self-pay | Admitting: Internal Medicine

## 2021-06-21 ENCOUNTER — Inpatient Hospital Stay: Payer: Medicaid Other | Admitting: Anesthesiology

## 2021-06-21 ENCOUNTER — Encounter
Admission: EM | Disposition: A | Discharge status: Rehabilitation Facility | Payer: Self-pay | Source: Home / Self Care | Attending: Internal Medicine

## 2021-06-21 HISTORY — PX: COLONOSCOPY WITH PROPOFOL: SHX5780

## 2021-06-21 LAB — GLUCOSE, CAPILLARY
Glucose-Capillary: 103 mg/dL — ABNORMAL HIGH (ref 70–99)
Glucose-Capillary: 95 mg/dL (ref 70–99)
Glucose-Capillary: 97 mg/dL (ref 70–99)
Glucose-Capillary: 181 mg/dL — ABNORMAL HIGH (ref 70–99)

## 2021-06-21 SURGERY — COLONOSCOPY WITH PROPOFOL
Anesthesia: General

## 2021-06-21 MED ORDER — PROPOFOL 500 MG/50ML IV EMUL
INTRAVENOUS | Status: AC
  Filled 2021-06-21: qty 50

## 2021-06-21 MED ORDER — LIDOCAINE HCL (CARDIAC) PF 100 MG/5ML IV SOSY
PREFILLED_SYRINGE | INTRAVENOUS | Status: DC | PRN
  Administered 2021-06-21: 50 mg via INTRAVENOUS

## 2021-06-21 MED ORDER — MIDAZOLAM HCL 2 MG/2ML IJ SOLN
INTRAMUSCULAR | Status: DC | PRN
  Administered 2021-06-21 (×2): 1 mg via INTRAVENOUS

## 2021-06-21 MED ORDER — PHENYLEPHRINE 40 MCG/ML (10ML) SYRINGE FOR IV PUSH (FOR BLOOD PRESSURE SUPPORT)
PREFILLED_SYRINGE | INTRAVENOUS | Status: DC | PRN
  Administered 2021-06-21: 40 ug via INTRAVENOUS

## 2021-06-21 MED ORDER — DEXMEDETOMIDINE (PRECEDEX) IN NS 20 MCG/5ML (4 MCG/ML) IV SYRINGE
PREFILLED_SYRINGE | INTRAVENOUS | Status: DC | PRN
  Administered 2021-06-21: 8 ug via INTRAVENOUS

## 2021-06-21 MED ORDER — MIDAZOLAM HCL 2 MG/2ML IJ SOLN
INTRAMUSCULAR | Status: AC
  Filled 2021-06-21: qty 2

## 2021-06-21 MED ORDER — PROPOFOL 500 MG/50ML IV EMUL
INTRAVENOUS | Status: DC | PRN
  Administered 2021-06-21: 100 ug/kg/min via INTRAVENOUS

## 2021-06-21 MED ORDER — PROPOFOL 10 MG/ML IV BOLUS
INTRAVENOUS | Status: DC | PRN
  Administered 2021-06-21: 50 mg via INTRAVENOUS

## 2021-06-21 MED ORDER — PANCRELIPASE (LIP-PROT-AMYL) 36000-114000 UNITS PO CPEP: 36000.0000 [IU] | ORAL_CAPSULE | Freq: Three times a day (TID) | ORAL | Status: DC

## 2021-06-21 NOTE — Op Note (Signed)
[facility] Patient Name: Jonathan Mcmillan Procedure Date: 06/21/2021 10:05 AM MRN: [id number] Account #: [id number] Date of Birth: [birth year] Admit Type: Inpatient Age: 26 Room: [facility] ENDO ROOM 4 Gender: Male Note Status: Finalized Instrument Name: Colonoscope [id number] Procedure:             Colonoscopy Indications:           This is the patient's first colonoscopy, Clinically                         significant diarrhea of unexplained origin Providers:             Lin Landsman MD, MD Referring MD:          Baltazar Apo, MD (Referring MD) Medicines:             General Anesthesia Complications:         No immediate complications. Estimated blood loss: None. Procedure:             Pre-Anesthesia Assessment:                        - Prior to the procedure, a History and Physical was                         performed, and patient medications and allergies were                         reviewed. The patient is competent. The risks and                         benefits of the procedure and the sedation options and                         risks were discussed with the patient. All questions                         were answered and informed consent was obtained.                         Patient identification and proposed procedure were                         verified by the physician, the nurse, the                         anesthesiologist, the anesthetist and the technician                         in the pre-procedure area in the procedure room in the                         endoscopy suite. Mental Status Examination: alert and                         oriented. Airway Examination: normal oropharyngeal                         airway and neck mobility. Respiratory Examination:  clear to auscultation. CV Examination: normal.                         Prophylactic Antibiotics: The patient does not require                          prophylactic antibiotics. Prior Anticoagulants: The                         patient has taken no previous anticoagulant or                         antiplatelet agents. ASA Grade Assessment: IV - A                         patient with severe systemic disease that is a                         constant threat to life. After reviewing the risks and                         benefits, the patient was deemed in satisfactory                         condition to undergo the procedure. The anesthesia                         plan was to use general anesthesia. Immediately prior                         to administration of medications, the patient was                         re-assessed for adequacy to receive sedatives. The                         heart rate, respiratory rate, oxygen saturations,                         blood pressure, adequacy of pulmonary ventilation, and                         response to care were monitored throughout the                         procedure. The physical status of the patient was                         re-assessed after the procedure.                        After obtaining informed consent, the colonoscope was                         passed under direct vision. Throughout the procedure,                         the patient's blood pressure, pulse, and oxygen  saturations were monitored continuously. The                         Colonoscope was introduced through the anus and                         advanced to the the cecum, identified by appendiceal                         orifice and ileocecal valve. The colonoscopy was                         performed with moderate difficulty due to poor bowel                         prep and significant looping. Successful completion of                         the procedure was aided by applying abdominal                         pressure. The patient tolerated the procedure well.                          The quality of the bowel preparation was poor. Findings:      Poor prep      The perianal and digital rectal examinations were normal. Pertinent       negatives include normal sphincter tone and no palpable rectal lesions.      Normal mucosa was found in the entire colon. Random colon Biopsies were       taken with a cold forceps for histology.      Unable to intubate TI due to poor prep Impression:            - Preparation of the colon was poor.                        - Normal mucosa in the entire examined colon. Biopsied. Recommendation:        - Return patient to hospital ward for ongoing care.                        - Resume regular diet today.                        - Continue present medications.                        - Await pathology results. Procedure Code(s):     --- Professional ---                        [id number], Colonoscopy, flexible; with biopsy, single or                         multiple Diagnosis Code(s):     --- Professional ---                        R19.7, Diarrhea, unspecified CPT copyright 2019 American Medical Association. All rights reserved. The codes documented in  this report are preliminary and upon coder review may  be revised to meet current compliance requirements. Dr. Ulyess Mort Lin Landsman MD, MD 06/21/2021 10:48:02 AM This report has been signed electronically. Number of Addenda: 0 Note Initiated On: 06/21/2021 10:05 AM Scope Withdrawal Time: 0 hours 11 minutes 22 seconds  Total Procedure Duration: 0 hours 16 minutes 22 seconds  Estimated Blood Loss:  Estimated blood loss: none.      [facility]

## 2021-06-21 NOTE — Plan of Care (Signed)

## 2021-06-21 NOTE — Progress Notes (Signed)
Patient is waiting to be discharged to CIR.  GI is planning for colonoscopy today and patient will possibly have hemodialysis afterwards.  He is currently medically stable for discharge.  Patient seen and examined at bedside.  Please refer to the full discharge summary done by Dr. Verlon Au on 06/20/2021 for full details.

## 2021-06-21 NOTE — Transfer of Care (Signed)
Immediate Anesthesia Transfer of Care Note  Patient: Jonathan Mcmillan  Procedure(s) Performed: COLONOSCOPY WITH PROPOFOL  Patient Location: PACU and Endoscopy Unit  Anesthesia Type:General  Level of Consciousness: drowsy  Airway & Oxygen Therapy: Patient Spontanous Breathing  Post-op Assessment: Report given to RN and Post -op Vital signs reviewed and stable  Post vital signs: Reviewed and stable  Last Vitals:  Vitals Value Taken Time  BP 97/49 06/21/21 1052  Temp    Pulse 75 06/21/21 1052  Resp    SpO2 100 % 06/21/21 1052  Vitals shown include unvalidated device data.  Last Pain:  Vitals:   06/21/21 0831  TempSrc:   PainSc: 6       Patients Stated Pain Goal: 0 (64/15/83 0940)  Complications: No notable events documented.

## 2021-06-21 NOTE — Anesthesia Preprocedure Evaluation (Addendum)
Anesthesia Evaluation  Patient identified by MRN, date of birth, ID band Patient awake    Reviewed: Allergy & Precautions, NPO status , Patient's Chart, lab work & pertinent test results  History of Anesthesia Complications Negative for: history of anesthetic complications  Airway Mallampati: II  TM Distance: >3 FB Neck ROM: full    Dental  (+) Chipped, Poor Dentition   Pulmonary pneumonia, Current Smoker and Patient abstained from smoking.,    Pulmonary exam normal        Cardiovascular hypertension, + CAD, + Past MI and +CHF  Normal cardiovascular exam     Neuro/Psych negative neurological ROS  negative psych ROS   GI/Hepatic negative GI ROS, Neg liver ROS, neg GERD  ,  Endo/Other  diabetes, Type 2  Renal/GU Dialysis, ARF and CRFRenal disease  negative genitourinary   Musculoskeletal   Abdominal   Peds  Hematology negative hematology ROS (+)   Anesthesia Other Findings Patient is NPO appropriate and reports no nausea or vomiting today.  Past Medical History: No date: Cannabinoid hyperemesis syndrome No date: CKD (chronic kidney disease), stage IV (HCC) No date: Diabetes 1.5, managed as type 1 ([place]) 05/21/2016: DKA (diabetic ketoacidoses) No date: Gastroparesis No date: HTN (hypertension) No date: Nicotine dependence 06/16/2017: Perirectal abscess 01/26/2018: Right arm cellulitis  Past Surgical History: 06/15/2021: DIALYSIS/PERMA CATHETER INSERTION; N/A     Comment:  Procedure: DIALYSIS/PERMA CATHETER INSERTION;  Surgeon:               Algernon Huxley, MD;  Location: [facility];                Service: Cardiovascular;  Laterality: N/A; 06/19/2021: DIALYSIS/PERMA CATHETER INSERTION; N/A     Comment:  Procedure: DIALYSIS/PERMA CATHETER INSERTION;  Surgeon:               Algernon Huxley, MD;  Location: [facility];                Service: Cardiovascular;  Laterality: N/A; 06/16/2017: INCISION AND  DRAINAGE PERIRECTAL ABSCESS; N/A     Comment:  Procedure: IRRIGATION AND DEBRIDEMENT PERIRECTAL               ABSCESS;  Surgeon: Florene Glen, MD;  Location: [facility]              ORS;  Service: General;  Laterality: N/A; No date: none  BMI    Body Mass Index: 23.37 kg/m      Reproductive/Obstetrics negative OB ROS                            Anesthesia Physical Anesthesia Plan  ASA: 4  Anesthesia Plan: General   Post-op Pain Management:    Induction: Intravenous  PONV Risk Score and Plan: Propofol infusion and TIVA  Airway Management Planned: Natural Airway and Nasal Cannula  Additional Equipment:   Intra-op Plan:   Post-operative Plan:   Informed Consent: I have reviewed the patients History and Physical, chart, labs and discussed the procedure including the risks, benefits and alternatives for the proposed anesthesia with the patient or authorized representative who has indicated his/her understanding and acceptance.     Dental Advisory Given  Plan Discussed with: Anesthesiologist, CRNA and Surgeon  Anesthesia Plan Comments: (Patient consented for risks of anesthesia including but not limited to:  - adverse reactions to medications - risk of airway placement if required - damage to eyes, teeth,  lips or other oral mucosa - nerve damage due to positioning  - sore throat or hoarseness - Damage to heart, brain, nerves, lungs, other parts of body or loss of life  Patient voiced understanding.)        Anesthesia Quick Evaluation

## 2021-06-21 NOTE — Progress Notes (Signed)
Physical Therapy Treatment Patient Details Name: Jonathan Mcmillan MRN: [id number] DOB: 02-15-1996 Today's Date: 06/21/2021   History of Present Illness Pt is a 26 y/o M admitted on 06/01/21 with AKI on CKD in setting of viral syndrome & diarrhea. Pt suffered in-hospital cardiac arrest, suspect due to severe metabolic acidosis & multiple metabolic derangements. Required CRRT and now preparing to undergo regular HD with R temp femoral catheter placed on 1/28 removed 2/7. Pt was extubated on 1/21 but reintubated 1/22. Extubated again on 1/26 & now weaned to room air. PMH: DM2, CKD stage IV, HTN, nicotine dependence.    PT Comments    Pt was pleasant and motivated to participate during the session and put forth good effort throughout. Pt continued to make good progress towards goals.  Pt able to go from sup to sit without physical assist but did need help with his LE's during sit to sup.  Pt required only minimal assist and cues for sequencing to come to full upright standing position.  Pt needed occasional min A for stability especially during turns during gait training but was able to demonstrate improved activity tolerance per below. Pt's SpO2 remained 100% throughout on room air with HR in the 90s, no adverse symptoms reported. Pt will benefit from PT services in an IR setting upon discharge to safely address deficits listed in patient problem list for decreased caregiver assistance and eventual return to PLOF.     Recommendations for follow up therapy are one component of a multi-disciplinary discharge planning process, led by the attending physician.  Recommendations may be updated based on patient status, additional functional criteria and insurance authorization.  Follow Up Recommendations  Acute inpatient rehab (3hours/day)     Assistance Recommended at Discharge Frequent or constant Supervision/Assistance  Patient can return home with the following Two people to help with walking and/or  transfers;Two people to help with bathing/dressing/bathroom;Direct supervision/assist for medications management;Help with stairs or ramp for entrance;Assistance with feeding;Assist for transportation;Assistance with cooking/housework;Direct supervision/assist for financial management   Equipment Recommendations  None recommended by PT    Recommendations for Other Services       Precautions / Restrictions Precautions Precautions: Fall Precaution Comments: R temporary femoral dialysis catheter removed 06/20/21 Restrictions Weight Bearing Restrictions: No     Mobility  Bed Mobility Overal bed mobility: Needs Assistance       Supine to sit: Supervision Sit to supine: Min assist   General bed mobility comments: Extra time and effort with bed rail during sup to sit but min A required for BLE management during sit to sup    Transfers Overall transfer level: Needs assistance Equipment used: Rolling walker (2 wheels) Transfers: Sit to/from Stand Sit to Stand: From elevated surface, Min assist           General transfer comment: Mod verbal cues for hand placement and increased trunk flexion    Ambulation/Gait Ambulation/Gait assistance: Min assist Gait Distance (Feet): 100 Feet x 1, 60 Feet x 1, 30 Feet x 1 Assistive device: Rolling walker (2 wheels) Gait Pattern/deviations: Step-through pattern, Drifts right/left, Narrow base of support, Trunk flexed Gait velocity: decreased     General Gait Details: Occasional min A for stability especially during turns; SpO2 100% throughout on room air with HR in the 90s, no adverse symptoms reported   Stairs             Wheelchair Mobility    Modified Rankin (Stroke Patients Only)       Balance  Overall balance assessment: Needs assistance Sitting-balance support: No upper extremity supported, Feet supported Sitting balance-Leahy Scale: Fair     Standing balance support: Bilateral upper extremity supported, During  functional activity Standing balance-Leahy Scale: Poor Standing balance comment: Occasional min A for stability                            Cognition Arousal/Alertness: Awake/alert Behavior During Therapy: Flat affect Overall Cognitive Status: Within Functional Limits for tasks assessed                                          Exercises Other Exercises Other Exercises: Sit to/from stand transfer training x 5 with cues for sequencing    General Comments        Pertinent Vitals/Pain Pain Assessment Pain Assessment: 0-10 Pain Score: 6  Pain Location: back Pain Descriptors / Indicators: Sore Pain Intervention(s): Repositioned, Monitored during session    Home Living                          Prior Function            PT Goals (current goals can now be found in the care plan section) Progress towards PT goals: Progressing toward goals    Frequency    Min 2X/week      PT Plan Current plan remains appropriate    Co-evaluation              AM-PAC PT "6 Clicks" Mobility   Outcome Measure  Help needed turning from your back to your side while in a flat bed without using bedrails?: A Little Help needed moving from lying on your back to sitting on the side of a flat bed without using bedrails?: A Little Help needed moving to and from a bed to a chair (including a wheelchair)?: A Little Help needed standing up from a chair using your arms (e.g., wheelchair or bedside chair)?: A Little Help needed to walk in hospital room?: A Little Help needed climbing 3-5 steps with a railing? : A Lot 6 Click Score: 17    End of Session Equipment Utilized During Treatment: Gait belt Activity Tolerance: Patient tolerated treatment well Patient left: in bed;with call bell/phone within reach;with bed alarm set;with nursing/sitter in room;Other (comment) (pt declined up in chair) Nurse Communication: Mobility status PT Visit Diagnosis:  Difficulty in walking, not elsewhere classified (R26.2);Muscle weakness (generalized) (M62.81)     Time: 5465-0354 PT Time Calculation (min) (ACUTE ONLY): 24 min  Charges:  $Gait Training: 8-22 mins $Therapeutic Activity: 8-22 mins                     D. Scott Shaquelle Hernon PT, DPT 06/21/21, 5:42 PM

## 2021-06-21 NOTE — Anesthesia Postprocedure Evaluation (Signed)
Anesthesia Post Note  Patient: Jonathan Mcmillan  Procedure(s) Performed: COLONOSCOPY WITH PROPOFOL  Patient location during evaluation: Endoscopy Anesthesia Type: General Level of consciousness: awake and alert Pain management: pain level controlled Vital Signs Assessment: post-procedure vital signs reviewed and stable Respiratory status: spontaneous breathing, nonlabored ventilation, respiratory function stable and patient connected to nasal cannula oxygen Cardiovascular status: blood pressure returned to baseline and stable Postop Assessment: no apparent nausea or vomiting Anesthetic complications: no   No notable events documented.   Last Vitals:  Vitals:   06/21/21 1110 06/21/21 1134  BP: (!) 104/56 (!) 135/91  Pulse: 78 80  Resp:  18  Temp:  36.5 C  SpO2: 100% 100%    Last Pain:  Vitals:   06/21/21 1134  TempSrc: Oral  PainSc:                  Precious Haws Geniyah Eischeid

## 2021-06-21 NOTE — Progress Notes (Signed)
Inpatient Rehab Admissions Coordinator:    I do not have a CIR bed for this Pt. Today, will follow for potential admit tomorrow.   Clemens Catholic, [place], [place] Admissions Coordinator  [phone] ([place]) [phone] (office)

## 2021-06-21 NOTE — Anesthesia Procedure Notes (Addendum)
Procedure Name: MAC Date/Time: 06/21/2021 10:18 AM Performed by: Biagio Borg, CRNA Pre-anesthesia Checklist: Patient identified, Emergency Drugs available, Suction available, Patient being monitored and Timeout performed Patient Re-evaluated:Patient Re-evaluated prior to induction Oxygen Delivery Method: Nasal cannula Induction Type: IV induction Placement Confirmation: positive ETCO2 and CO2 detector

## 2021-06-21 NOTE — Progress Notes (Signed)
Patient completes 3-hour hemodialysis treatment without incident. RIJ CVC functions well, maintaining prescribed BFR.; dressing changed. Targeted UF met with 0.5-liter fluid removal. Patient returning to assigned room, report given to primary nurse.

## 2021-06-21 NOTE — Progress Notes (Signed)
[facility]  ROUNDING NOTE   Subjective:   Patient seen and evaluated during dialysis   HEMODIALYSIS FLOWSHEET:  Blood Flow Rate (mL/min): 300 mL/min Arterial Pressure (mmHg): -90 mmHg Venous Pressure (mmHg): 80 mmHg Transmembrane Pressure (mmHg): 60 mmHg Ultrafiltration Rate (mL/min): 330 mL/min Dialysate Flow Rate (mL/min): 500 ml/min Conductivity: Machine : 13.8 Conductivity: Machine : 13.8 Dialysis Fluid Bolus: Normal Saline Bolus Amount (mL): 250 mL Dialysate Change: 2K (3.0 Ca)  No complains at this time Denies pain and discomfort  Objective:  Vital signs in last 24 hours:  Temp:  [97.7 F (36.5 C)-98.4 F (36.9 C)] 98 F (36.7 C) (02/08 1254) Pulse Rate:  [78-85] 81 (02/08 1330) Resp:  [11-18] 11 (02/08 1345) BP: (97-142)/(49-91) 110/67 (02/08 1345) SpO2:  [100 %] 100 % (02/08 1330) Weight:  [66.6 kg-69.7 kg] 66.6 kg (02/08 1254)  Weight change: 2.8 kg Filed Weights   06/19/21 1817 06/21/21 0501 06/21/21 1254  Weight: 70.3 kg 69.7 kg 66.6 kg    Intake/Output: I/O last 3 completed shifts: In: 200 [P.O.:200] Out: -    Intake/Output this shift:  No intake/output data recorded.  Physical Exam: General: No acute distress  Eyes: Anicteric  Lungs:  Diminished in bases normal effort  Heart: S1S2, no rubs  Abdomen:  Soft, nontender, BS present  Extremities: No peripheral edema.  Neurologic: Awake, alert, no rbus  Skin: Peeling skin noted  Access:  Rt permcath placed on [place] on 06/15/21, exchanged on 06/19/21 by Dr Lucky Cowboy  Rectal tube  Basic Metabolic Panel: Recent Labs  Lab 06/15/21 0402 06/16/21 0543 06/17/21 0613 06/19/21 0905 06/20/21 0437  NA 136 134* 135 138 136  K 4.3 4.9 4.4 4.8 4.5  CL 105 104 104 106 104  CO2 24 23 24 24 25   GLUCOSE 146* 207* 172* 98 90  BUN 51* 60* 38* 50* 30*  CREATININE 3.33* 4.31* 3.43* 5.03* 3.50*  CALCIUM 6.5* 6.3* 6.6* 6.7* 6.9*  MG  --  2.3  --   --   --   PHOS  --  4.5 3.8 5.2* 4.4      Liver Function Tests: Recent Labs  Lab 06/16/21 0543 06/17/21 0613 06/19/21 0905 06/20/21 0437  AST  --   --   --  14*  ALT  --   --   --  5  ALKPHOS  --   --   --  133*  BILITOT  --   --   --  0.9  PROT  --   --   --  4.9*  ALBUMIN 1.6* 1.7* 1.8* 1.6*   1.6*    No results for input(s): LIPASE, AMYLASE in the last 168 hours.  No results for input(s): AMMONIA in the last 168 hours.   CBC: Recent Labs  Lab 06/15/21 0402 06/16/21 0509 06/17/21 0613 06/19/21 0905 06/20/21 0437  WBC 8.2 7.0 6.3 9.2 10.1  HGB 8.8* 8.1* 8.1* 7.9* 7.4*  HCT 26.2* 24.4* 25.2* 25.0* 23.0*  MCV 87.3 88.7 91.6 92.6 93.1  PLT 58* 63* 58* 70* 70*     Cardiac Enzymes: No results for input(s): CKTOTAL, CKMB, CKMBINDEX, TROPONINI in the last 168 hours.   BNP: Invalid input(s): POCBNP  CBG: Recent Labs  Lab 06/20/21 1156 06/20/21 1646 06/20/21 2355 06/21/21 0825 06/21/21 1135  GLUCAP 104* 223* 258* 103* 95     Microbiology: Results for orders placed or performed during the hospital encounter of 05/31/21  Resp Panel by RT-PCR (Flu A&B, Covid) Nasopharyngeal Swab  Status: None   Collection Time: 05/31/21  6:58 AM   Specimen: Nasopharyngeal Swab; Nasopharyngeal(NP) swabs in vial transport medium  Result Value Ref Range Status   SARS Coronavirus 2 by RT PCR NEGATIVE NEGATIVE Final    Comment: (NOTE) SARS-CoV-2 target nucleic acids are NOT DETECTED.  The SARS-CoV-2 RNA is generally detectable in upper respiratory specimens during the acute phase of infection. The lowest concentration of SARS-CoV-2 viral copies this assay can detect is 138 copies/mL. A negative result does not preclude SARS-Cov-2 infection and should not be used as the sole basis for treatment or other patient management decisions. A negative result may occur with  improper specimen collection/handling, submission of specimen other than nasopharyngeal swab, presence of viral mutation(s) within the areas  targeted by this assay, and inadequate number of viral copies(<138 copies/mL). A negative result must be combined with clinical observations, patient history, and epidemiological information. The expected result is Negative.  Fact Sheet for Patients:  EntrepreneurPulse.com.au  Fact Sheet for Healthcare Providers:  IncredibleEmployment.be  This test is no t yet approved or cleared by the [place] FDA and  has been authorized for detection and/or diagnosis of SARS-CoV-2 by FDA under an Emergency Use Authorization (EUA). This EUA will remain  in effect (meaning this test can be used) for the duration of the COVID-19 declaration under Section 564(b)(1) of the Act, 21 U.S.C.section 360bbb-3(b)(1), unless the authorization is terminated  or revoked sooner.       Influenza A by PCR NEGATIVE NEGATIVE Final   Influenza B by PCR NEGATIVE NEGATIVE Final    Comment: (NOTE) The Xpert Xpress SARS-CoV-2/FLU/RSV plus assay is intended as an aid in the diagnosis of influenza from Nasopharyngeal swab specimens and should not be used as a sole basis for treatment. Nasal washings and aspirates are unacceptable for Xpert Xpress SARS-CoV-2/FLU/RSV testing.  Fact Sheet for Patients: EntrepreneurPulse.com.au  Fact Sheet for Healthcare Providers: IncredibleEmployment.be  This test is not yet approved or cleared by the [place] FDA and has been authorized for detection and/or diagnosis of SARS-CoV-2 by FDA under an Emergency Use Authorization (EUA). This EUA will remain in effect (meaning this test can be used) for the duration of the COVID-19 declaration under Section 564(b)(1) of the Act, 21 U.S.C. section 360bbb-3(b)(1), unless the authorization is terminated or revoked.  Performed at [facility], [place], [place] [address]   Respiratory (~20 pathogens) panel by PCR     Status: None    Collection Time: 05/31/21  6:58 AM   Specimen: Nasopharyngeal Swab; Respiratory  Result Value Ref Range Status   Adenovirus NOT DETECTED NOT DETECTED Final   Coronavirus 229E NOT DETECTED NOT DETECTED Final    Comment: (NOTE) The Coronavirus on the Respiratory Panel, DOES NOT test for the novel  Coronavirus (2019 nCoV)    Coronavirus HKU1 NOT DETECTED NOT DETECTED Final   Coronavirus NL63 NOT DETECTED NOT DETECTED Final   Coronavirus OC43 NOT DETECTED NOT DETECTED Final   Metapneumovirus NOT DETECTED NOT DETECTED Final   Rhinovirus / Enterovirus NOT DETECTED NOT DETECTED Final   Influenza A NOT DETECTED NOT DETECTED Final   Influenza B NOT DETECTED NOT DETECTED Final   Parainfluenza Virus 1 NOT DETECTED NOT DETECTED Final   Parainfluenza Virus 2 NOT DETECTED NOT DETECTED Final   Parainfluenza Virus 3 NOT DETECTED NOT DETECTED Final   Parainfluenza Virus 4 NOT DETECTED NOT DETECTED Final   Respiratory Syncytial Virus NOT DETECTED NOT DETECTED Final   Bordetella  pertussis NOT DETECTED NOT DETECTED Final   Bordetella Parapertussis NOT DETECTED NOT DETECTED Final   Chlamydophila pneumoniae NOT DETECTED NOT DETECTED Final   Mycoplasma pneumoniae NOT DETECTED NOT DETECTED Final    Comment: Performed at [facility], [place] [address] [place], [place] [address]  Group A Strep by PCR [facility] Only)     Status: None   Collection Time: 05/31/21  8:35 AM   Specimen: Throat; Sterile Swab  Result Value Ref Range Status   Group A Strep by PCR NOT DETECTED NOT DETECTED Final    Comment: Performed at [facility], [place] [place], [place] [address]  Blood culture (routine x 2)     Status: None   Collection Time: 05/31/21  8:39 AM   Specimen: BLOOD  Result Value Ref Range Status   Specimen Description BLOOD BLOOD RIGHT FOREARM  Final   Special Requests   Final    BOTTLES DRAWN AEROBIC AND ANAEROBIC Blood Culture results may not be optimal due to an excessive volume of blood  received in culture bottles   Culture   Final    NO GROWTH 5 DAYS Performed at [facility], [address] [place], [place] [address]    Report Status 06/05/2021 FINAL  Final  Blood culture (routine x 2)     Status: None   Collection Time: 05/31/21  8:39 AM   Specimen: BLOOD  Result Value Ref Range Status   Specimen Description BLOOD RIGHT ANTECUBITAL  Final   Special Requests   Final    BOTTLES DRAWN AEROBIC AND ANAEROBIC Blood Culture results may not be optimal due to an excessive volume of blood received in culture bottles   Culture   Final    NO GROWTH 5 DAYS Performed at [facility], [address] [place], [place] [address]    Report Status 06/05/2021 FINAL  Final  C Difficile Quick Screen w PCR reflex     Status: None   Collection Time: 05/31/21  1:20 PM   Specimen: Stool  Result Value Ref Range Status   C Diff antigen NEGATIVE NEGATIVE Final   C Diff toxin NEGATIVE NEGATIVE Final   C Diff interpretation No C. difficile detected.  Final    Comment: Performed at [facility], [address] [place],  [address]  Gastrointestinal Panel by PCR , Stool     Status: None   Collection Time: 05/31/21  1:30 PM   Specimen: Stool  Result Value Ref Range Status   Campylobacter species NOT DETECTED NOT DETECTED Final   Plesimonas shigelloides NOT DETECTED NOT DETECTED Final   Salmonella species NOT DETECTED NOT DETECTED Final   Yersinia enterocolitica NOT DETECTED NOT DETECTED Final   Vibrio species NOT DETECTED NOT DETECTED Final   Vibrio cholerae NOT DETECTED NOT DETECTED Final   Enteroaggregative E coli (EAEC) NOT DETECTED NOT DETECTED Final   Enteropathogenic E coli (EPEC) NOT DETECTED NOT DETECTED Final   Enterotoxigenic E coli (ETEC) NOT DETECTED NOT DETECTED Final   Shiga like toxin producing E coli (STEC) NOT DETECTED NOT DETECTED Final   Shigella/Enteroinvasive E coli (EIEC) NOT DETECTED NOT DETECTED Final   Cryptosporidium NOT  DETECTED NOT DETECTED Final   Cyclospora cayetanensis NOT DETECTED NOT DETECTED Final   Entamoeba histolytica NOT DETECTED NOT DETECTED Final   Giardia lamblia NOT DETECTED NOT DETECTED Final   Adenovirus F40/41 NOT DETECTED NOT DETECTED Final   Astrovirus NOT DETECTED NOT DETECTED Final   Norovirus  GI/GII NOT DETECTED NOT DETECTED Final   Rotavirus A NOT DETECTED NOT DETECTED Final   Sapovirus (I, II, IV, and V) NOT DETECTED NOT DETECTED Final    Comment: Performed at [facility], [address], [place], [place] [address]  Urine Culture     Status: None   Collection Time: 06/01/21 10:23 AM   Specimen: In/Out Cath Urine  Result Value Ref Range Status   Specimen Description   Final    IN/OUT CATH URINE Performed at [facility], [address] [place], [place] [address]    Special Requests   Final    NONE Performed at [facility], [address] [place], [place] [address]    Culture   Final    NO GROWTH Performed at [facility], [place] [address], [place], [place] [address]    Report Status 06/03/2021 FINAL  Final  Culture, Respiratory w Gram Stain     Status: None   Collection Time: 06/01/21  3:59 PM   Specimen: Tracheal Aspirate; Respiratory  Result Value Ref Range Status   Specimen Description   Final    TRACHEAL ASPIRATE Performed at [facility], [address], [place], [place] [address]    Special Requests   Final    NONE Performed at [facility], [place], [place] [address]    Gram Stain   Final    RARE SQUAMOUS EPITHELIAL CELLS PRESENT MODERATE WBC PRESENT,BOTH PMN AND MONONUCLEAR MODERATE GRAM POSITIVE COCCI FEW GRAM NEGATIVE RODS FEW GRAM POSITIVE RODS RARE YEAST    Culture   Final    FEW Consistent with normal respiratory flora. No Pseudomonas species isolated Performed at [facility] [address], [place], [place] [address]    Report Status 06/03/2021 FINAL  Final   CULTURE, BLOOD (ROUTINE X 2) w Reflex to ID Panel     Status: None   Collection Time: 06/01/21  7:06 PM   Specimen: BLOOD  Result Value Ref Range Status   Specimen Description BLOOD RIGHT ANTECUBITAL  Final   Special Requests   Final    BOTTLES DRAWN AEROBIC ONLY Blood Culture results may not be optimal due to an inadequate volume of blood received in culture bottles   Culture   Final    NO GROWTH 5 DAYS Performed at [facility], [address] [place], [place] [address]    Report Status 06/06/2021 FINAL  Final  CULTURE, BLOOD (ROUTINE X 2) w Reflex to ID Panel     Status: None   Collection Time: 06/01/21  7:37 PM   Specimen: BLOOD  Result Value Ref Range Status   Specimen Description BLOOD [facility]  Final   Special Requests BOTTLES DRAWN AEROBIC AND ANAEROBIC BCLV  Final   Culture   Final    NO GROWTH 5 DAYS Performed at [facility], [address] [place],  [address]    Report Status 06/06/2021 FINAL  Final  MRSA Next Gen by PCR, Nasal     Status: None   Collection Time: 06/02/21 10:46 AM   Specimen: Nasal Mucosa; Nasal Swab  Result Value Ref Range Status   MRSA by PCR Next Gen NOT DETECTED NOT DETECTED Final    Comment: (NOTE) The GeneXpert MRSA Assay (FDA approved for NASAL specimens only), is one component of a comprehensive MRSA colonization surveillance program. It is not intended to diagnose MRSA infection nor to guide or monitor treatment for MRSA infections.  Test performance is not FDA approved in patients less than 64 years old. Performed at [facility], [address] [place], [place] [address]   Culture, Respiratory w Gram Stain     Status: None   Collection Time: 06/04/21  7:38 AM   Specimen: Tracheal Aspirate; Respiratory  Result Value Ref Range Status   Specimen Description   Final    TRACHEAL ASPIRATE Performed at [facility], [address], [place], [place] [address]    Special Requests   Final     NONE Performed at [facility], [address] [place], [place] [address]    Gram Stain   Final    MODERATE WBC PRESENT,BOTH PMN AND MONONUCLEAR NO ORGANISMS SEEN Performed at [facility], [place] [address] [place], [place] [address]    Culture FEW CANDIDA ALBICANS RARE ESCHERICHIA COLI   Final   Report Status 06/07/2021 FINAL  Final   Organism ID, Bacteria ESCHERICHIA COLI  Final      Susceptibility   Escherichia coli - MIC*    AMPICILLIN >=32 RESISTANT Resistant     CEFAZOLIN <=4 SENSITIVE Sensitive     CEFEPIME <=0.12 SENSITIVE Sensitive     CEFTAZIDIME <=1 SENSITIVE Sensitive     CEFTRIAXONE <=0.25 SENSITIVE Sensitive     CIPROFLOXACIN <=0.25 SENSITIVE Sensitive     GENTAMICIN <=1 SENSITIVE Sensitive     IMIPENEM <=0.25 SENSITIVE Sensitive     TRIMETH/SULFA >=320 RESISTANT Resistant     AMPICILLIN/SULBACTAM >=32 RESISTANT Resistant     PIP/TAZO <=4 SENSITIVE Sensitive     * RARE ESCHERICHIA COLI  CULTURE, BLOOD (ROUTINE X 2) w Reflex to ID Panel     Status: None   Collection Time: 06/04/21  6:35 PM   Specimen: BLOOD  Result Value Ref Range Status   Specimen Description BLOOD BLOOD LEFT HAND  Final   Special Requests   Final    BOTTLES DRAWN AEROBIC AND ANAEROBIC Blood Culture adequate volume   Culture   Final    NO GROWTH 5 DAYS Performed at [facility], [address] [place], [place] [address]    Report Status 06/09/2021 FINAL  Final  C Difficile Quick Screen (NO PCR Reflex)     Status: None   Collection Time: 06/20/21  7:15 PM   Specimen: STOOL  Result Value Ref Range Status   C Diff antigen NEGATIVE NEGATIVE Final   C Diff toxin NEGATIVE NEGATIVE Final   C Diff interpretation No C. difficile detected.  Final    Comment: Performed at [facility], [address] [place], [place] [address]    Coagulation Studies: No results for input(s): LABPROT, INR in the last 72 hours.   Urinalysis: No results for input(s):  COLORURINE, LABSPEC, PHURINE, GLUCOSEU, HGBUR, BILIRUBINUR, KETONESUR, PROTEINUR, UROBILINOGEN, NITRITE, LEUKOCYTESUR in the last 72 hours.  Invalid input(s): APPERANCEUR     Imaging: No results found.   Medications:    sodium chloride Stopped (06/12/21 2313)   sodium chloride 100 mL/hr at 06/21/21 1044    (feeding supplement) PROSource Plus  30 mL Oral TID BM   amiodarone  200 mg Oral Daily   vitamin C  500 mg Oral BID   calcitRIOL  0.5 mcg Oral Daily   calcium carbonate  1 tablet Oral TID   carvedilol  3.125 mg Oral BID WC   chlorhexidine gluconate (MEDLINE KIT)  15 mL Mouth Rinse BID   Chlorhexidine Gluconate Cloth  6 each Topical Daily  Chlorhexidine Gluconate Cloth  6 each Topical Q0600   cinacalcet  30 mg Oral Q breakfast   collagenase   Topical Daily   ferrous sulfate  325 mg Oral Q breakfast   hydrocerin   Topical BID   insulin aspart  0-15 Units Subcutaneous TID WC   lipase/protease/amylase  36,000 Units Oral TID WC   loperamide  2 mg Oral BID   mouth rinse  15 mL Mouth Rinse BID   multivitamin  1 tablet Oral QHS   pantoprazole  40 mg Oral Daily   sacubitril-valsartan  1 tablet Oral BID   sodium chloride flush  10-40 mL Intracatheter Q12H   thiamine  100 mg Oral Daily   zinc sulfate  220 mg Oral Daily   sodium chloride, guaiFENesin-dextromethorphan, HYDROmorphone (DILAUDID) injection, ipratropium-albuterol, lip balm, ondansetron (ZOFRAN) IV, ondansetron **OR** [DISCONTINUED] ondansetron (ZOFRAN) IV, oxyCODONE, polyvinyl alcohol, sodium chloride flush  Assessment/ Plan:  Mr. MEILECH VIRTS is a 26 y.o. black male with hypertension, insulin dependent diabetes mellitus type I, diabetic gastroparesis, diabetic neuropathy, tobacco use, THC use, right toe amputation, perirectal abscess, who is admitted to [facility] on 05/31/2021 for SIRS (systemic inflammatory response syndrome) ([place]) [R65.10] AKI (acute kidney injury) ([place]) [N17.9] Symptomatic anemia [D64.9] Acute renal  failure superimposed on stage 5 chronic kidney disease, not on chronic dialysis, unspecified acute renal failure type ([place]) [N17.9, N18.5] Hypertension, unspecified type [I10] Acute cough [R05.1]  Cardiac arrest with code blue 06/01/2021 at 10 am. Transferred to ICU. Intubated  End-stage renal disease requiring hemodialysis. Based on patient's history of nephrotic range proteinuria and hematuria and lack of sustainable renal recovery, we feel patient has progressed to end-stage renal disease. Strong family history of dialysis with mother with ESRD before passing. Was on CRRT.  - Receiving dialysis today after procedure, tolerating well. Next treatment scheduled for Friday - Appreciate dialysis coordinator confirming outpatient chair time at [facility] on a MWF schedule.  Currently awaiting insurance approval.    Acute respiratory failure Extubated then re-intubated 06/03/2021; extubated 06/08/2021 -Respiratory status stable  Hypotension with cardiogenic shock -Blood pressure 114/69 during dialysis -Prescribed amiodarone  4. 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 Thrombocytopenia Hematology evaluation on 1/22 Suspected peripheral destruction Platelets 70,000    LOS: [place] 2/8/20232:08 PM

## 2021-06-22 ENCOUNTER — Encounter: Payer: Self-pay | Admitting: Gastroenterology

## 2021-06-22 ENCOUNTER — Inpatient Hospital Stay (HOSPITAL_COMMUNITY)
Admission: RE | Admit: 2021-06-22 | Discharge: 2021-06-29 | DRG: 945 | Disposition: A | Discharge status: Home / Self Care | Payer: Medicaid Other | Source: Intra-hospital | Attending: Physical Medicine & Rehabilitation | Admitting: Physical Medicine & Rehabilitation

## 2021-06-22 DIAGNOSIS — Z992 Dependence on renal dialysis: Secondary | ICD-10-CM | POA: Diagnosis not present

## 2021-06-22 DIAGNOSIS — Z8674 Personal history of sudden cardiac arrest: Secondary | ICD-10-CM | POA: Diagnosis not present

## 2021-06-22 DIAGNOSIS — D631 Anemia in chronic kidney disease: Secondary | ICD-10-CM | POA: Diagnosis present

## 2021-06-22 DIAGNOSIS — F17210 Nicotine dependence, cigarettes, uncomplicated: Secondary | ICD-10-CM | POA: Diagnosis present

## 2021-06-22 DIAGNOSIS — Z833 Family history of diabetes mellitus: Secondary | ICD-10-CM

## 2021-06-22 DIAGNOSIS — Z716 Tobacco abuse counseling: Secondary | ICD-10-CM

## 2021-06-22 DIAGNOSIS — K3184 Gastroparesis: Secondary | ICD-10-CM | POA: Diagnosis present

## 2021-06-22 DIAGNOSIS — D696 Thrombocytopenia, unspecified: Secondary | ICD-10-CM | POA: Diagnosis present

## 2021-06-22 DIAGNOSIS — E108 Type 1 diabetes mellitus with unspecified complications: Secondary | ICD-10-CM | POA: Diagnosis present

## 2021-06-22 DIAGNOSIS — E440 Moderate protein-calorie malnutrition: Secondary | ICD-10-CM | POA: Diagnosis present

## 2021-06-22 DIAGNOSIS — R159 Full incontinence of feces: Secondary | ICD-10-CM | POA: Diagnosis present

## 2021-06-22 DIAGNOSIS — I4891 Unspecified atrial fibrillation: Secondary | ICD-10-CM | POA: Diagnosis present

## 2021-06-22 DIAGNOSIS — E8809 Other disorders of plasma-protein metabolism, not elsewhere classified: Secondary | ICD-10-CM | POA: Diagnosis present

## 2021-06-22 DIAGNOSIS — K219 Gastro-esophageal reflux disease without esophagitis: Secondary | ICD-10-CM | POA: Diagnosis present

## 2021-06-22 DIAGNOSIS — E1043 Type 1 diabetes mellitus with diabetic autonomic (poly)neuropathy: Secondary | ICD-10-CM | POA: Diagnosis present

## 2021-06-22 DIAGNOSIS — Z794 Long term (current) use of insulin: Secondary | ICD-10-CM

## 2021-06-22 DIAGNOSIS — I120 Hypertensive chronic kidney disease with stage 5 chronic kidney disease or end stage renal disease: Secondary | ICD-10-CM | POA: Diagnosis present

## 2021-06-22 DIAGNOSIS — Z79899 Other long term (current) drug therapy: Secondary | ICD-10-CM | POA: Diagnosis not present

## 2021-06-22 DIAGNOSIS — R5381 Other malaise: Principal | ICD-10-CM | POA: Diagnosis present

## 2021-06-22 DIAGNOSIS — G931 Anoxic brain damage, not elsewhere classified: Secondary | ICD-10-CM

## 2021-06-22 DIAGNOSIS — N186 End stage renal disease: Secondary | ICD-10-CM | POA: Diagnosis present

## 2021-06-22 DIAGNOSIS — L89150 Pressure ulcer of sacral region, unstageable: Secondary | ICD-10-CM | POA: Diagnosis present

## 2021-06-22 DIAGNOSIS — L89620 Pressure ulcer of left heel, unstageable: Secondary | ICD-10-CM | POA: Diagnosis present

## 2021-06-22 DIAGNOSIS — S31000S Unspecified open wound of lower back and pelvis without penetration into retroperitoneum, sequela: Secondary | ICD-10-CM | POA: Diagnosis not present

## 2021-06-22 DIAGNOSIS — M898X9 Other specified disorders of bone, unspecified site: Secondary | ICD-10-CM | POA: Diagnosis present

## 2021-06-22 DIAGNOSIS — Z6824 Body mass index (BMI) 24.0-24.9, adult: Secondary | ICD-10-CM

## 2021-06-22 DIAGNOSIS — R197 Diarrhea, unspecified: Secondary | ICD-10-CM | POA: Diagnosis present

## 2021-06-22 DIAGNOSIS — E1022 Type 1 diabetes mellitus with diabetic chronic kidney disease: Secondary | ICD-10-CM | POA: Diagnosis present

## 2021-06-22 LAB — GLUCOSE, CAPILLARY
Glucose-Capillary: 173 mg/dL — ABNORMAL HIGH (ref 70–99)
Glucose-Capillary: 209 mg/dL — ABNORMAL HIGH (ref 70–99)
Glucose-Capillary: 109 mg/dL — ABNORMAL HIGH (ref 70–99)

## 2021-06-22 LAB — CBC
HCT: 20.7 % — ABNORMAL LOW (ref 39.0–52.0)
Hemoglobin: 6.6 g/dL — CL (ref 13.0–17.0)
MCH: 29.5 pg (ref 26.0–34.0)
MCHC: 31.9 g/dL (ref 30.0–36.0)
MCV: 92.4 fL (ref 80.0–100.0)
Platelets: 54 10*3/uL — ABNORMAL LOW (ref 150–400)
RBC: 2.24 MIL/uL — ABNORMAL LOW (ref 4.22–5.81)
RDW: 14.8 % (ref 11.5–15.5)
WBC: 11.1 10*3/uL — ABNORMAL HIGH (ref 4.0–10.5)
nRBC: 0 % (ref 0.0–0.2)

## 2021-06-22 LAB — CALPROTECTIN, FECAL: Calprotectin, Fecal: 103 ug/g (ref 0–120)

## 2021-06-22 LAB — PARATHYROID HORMONE, INTACT (NO CA): PTH: 94 pg/mL — ABNORMAL HIGH (ref 15–65)

## 2021-06-22 LAB — SURGICAL PATHOLOGY

## 2021-06-22 MED ORDER — BLISTEX MEDICATED EX OINT
TOPICAL_OINTMENT | CUTANEOUS | Status: DC | PRN
  Filled 2021-06-22: qty 6.3

## 2021-06-22 MED ORDER — AMIODARONE HCL 200 MG PO TABS
200.0000 mg | ORAL_TABLET | Freq: Every day | ORAL | Status: DC
  Administered 2021-06-23 – 2021-06-29 (×7): 200 mg via ORAL
  Filled 2021-06-22 (×7): qty 1

## 2021-06-22 MED ORDER — BISACODYL 5 MG PO TBEC: 5.0000 mg | DELAYED_RELEASE_TABLET | Freq: Every day | ORAL | Status: DC | PRN

## 2021-06-22 MED ORDER — TRAZODONE HCL 50 MG PO TABS
25.0000 mg | ORAL_TABLET | Freq: Every evening | ORAL | Status: DC | PRN
  Administered 2021-06-23: 50 mg via ORAL
  Filled 2021-06-22 (×2): qty 1

## 2021-06-22 MED ORDER — PANCRELIPASE (LIP-PROT-AMYL) 36000-114000 UNITS PO CPEP
36000.0000 [IU] | ORAL_CAPSULE | Freq: Three times a day (TID) | ORAL | Status: DC
  Administered 2021-06-22 – 2021-06-29 (×11): 36000 [IU] via ORAL
  Filled 2021-06-22 (×22): qty 1

## 2021-06-22 MED ORDER — THIAMINE HCL 100 MG PO TABS
100.0000 mg | ORAL_TABLET | Freq: Every day | ORAL | Status: DC
  Administered 2021-06-23 – 2021-06-29 (×7): 100 mg via ORAL
  Filled 2021-06-22 (×7): qty 1

## 2021-06-22 MED ORDER — RENA-VITE PO TABS
1.0000 | ORAL_TABLET | Freq: Every day | ORAL | Status: DC
  Administered 2021-06-22 – 2021-06-28 (×7): 1 via ORAL
  Filled 2021-06-22 (×7): qty 1

## 2021-06-22 MED ORDER — PANTOPRAZOLE SODIUM 40 MG PO TBEC
40.0000 mg | DELAYED_RELEASE_TABLET | Freq: Every day | ORAL | Status: DC
  Administered 2021-06-23 – 2021-06-29 (×7): 40 mg via ORAL
  Filled 2021-06-22 (×7): qty 1

## 2021-06-22 MED ORDER — CHLORHEXIDINE GLUCONATE CLOTH 2 % EX PADS
6.0000 | MEDICATED_PAD | Freq: Every day | CUTANEOUS | Status: DC
  Administered 2021-06-23 – 2021-06-27 (×4): 6 via TOPICAL

## 2021-06-22 MED ORDER — INSULIN ASPART 100 UNIT/ML IJ SOLN: 0.0000 [IU] | Freq: Every day | INTRAMUSCULAR | Status: DC

## 2021-06-22 MED ORDER — OXYCODONE HCL 5 MG PO TABS
5.0000 mg | ORAL_TABLET | ORAL | Status: DC | PRN
Start: 2021-06-22 — End: 2021-06-29
  Administered 2021-06-22 – 2021-06-29 (×38): 5 mg via ORAL
  Filled 2021-06-22 (×40): qty 1

## 2021-06-22 MED ORDER — LOPERAMIDE HCL 2 MG PO CAPS
2.0000 mg | ORAL_CAPSULE | Freq: Two times a day (BID) | ORAL | Status: DC
  Administered 2021-06-22 – 2021-06-29 (×10): 2 mg via ORAL
  Filled 2021-06-22 (×14): qty 1

## 2021-06-22 MED ORDER — COLLAGENASE 250 UNIT/GM EX OINT
TOPICAL_OINTMENT | Freq: Every day | CUTANEOUS | Status: DC
  Filled 2021-06-22: qty 30

## 2021-06-22 MED ORDER — PROSOURCE PLUS PO LIQD
30.0000 mL | Freq: Three times a day (TID) | ORAL | Status: DC
  Administered 2021-06-27: 30 mL via ORAL
  Filled 2021-06-22 (×7): qty 30

## 2021-06-22 MED ORDER — PROCHLORPERAZINE 25 MG RE SUPP: 12.5000 mg | Freq: Four times a day (QID) | RECTAL | Status: DC | PRN

## 2021-06-22 MED ORDER — ENOXAPARIN SODIUM 30 MG/0.3ML IJ SOSY
30.0000 mg | PREFILLED_SYRINGE | INTRAMUSCULAR | Status: DC
  Administered 2021-06-22: 30 mg via SUBCUTANEOUS
  Filled 2021-06-22: qty 0.3

## 2021-06-22 MED ORDER — CINACALCET HCL 30 MG PO TABS
30.0000 mg | ORAL_TABLET | Freq: Every day | ORAL | Status: DC
  Administered 2021-06-23 – 2021-06-29 (×7): 30 mg via ORAL
  Filled 2021-06-22 (×7): qty 1

## 2021-06-22 MED ORDER — ZINC SULFATE 220 (50 ZN) MG PO CAPS
220.0000 mg | ORAL_CAPSULE | Freq: Every day | ORAL | Status: AC
  Administered 2021-06-23 – 2021-06-25 (×3): 220 mg via ORAL
  Filled 2021-06-22 (×3): qty 1

## 2021-06-22 MED ORDER — DIPHENHYDRAMINE HCL 12.5 MG/5ML PO ELIX: 12.5000 mg | ORAL_SOLUTION | Freq: Four times a day (QID) | ORAL | Status: DC | PRN

## 2021-06-22 MED ORDER — PROCHLORPERAZINE EDISYLATE 10 MG/2ML IJ SOLN: 5.0000 mg | Freq: Four times a day (QID) | INTRAMUSCULAR | Status: DC | PRN

## 2021-06-22 MED ORDER — POLYVINYL ALCOHOL 1.4 % OP SOLN
2.0000 [drp] | OPHTHALMIC | Status: DC | PRN
  Administered 2021-06-22 – 2021-06-27 (×7): 2 [drp] via OPHTHALMIC
  Filled 2021-06-22: qty 15

## 2021-06-22 MED ORDER — CALCIUM CARBONATE ANTACID 500 MG PO CHEW
1.0000 | CHEWABLE_TABLET | Freq: Three times a day (TID) | ORAL | Status: DC
  Administered 2021-06-22 – 2021-06-28 (×2): 200 mg via ORAL
  Filled 2021-06-22 (×14): qty 1

## 2021-06-22 MED ORDER — DARBEPOETIN ALFA 60 MCG/0.3ML IJ SOSY
60.0000 ug | PREFILLED_SYRINGE | INTRAMUSCULAR | Status: DC
  Administered 2021-06-23: 60 ug via INTRAVENOUS
  Filled 2021-06-22 (×2): qty 0.3

## 2021-06-22 MED ORDER — SENNOSIDES-DOCUSATE SODIUM 8.6-50 MG PO TABS: 1.0000 | ORAL_TABLET | Freq: Every evening | ORAL | Status: DC | PRN

## 2021-06-22 MED ORDER — ASCORBIC ACID 500 MG PO TABS
500.0000 mg | ORAL_TABLET | Freq: Two times a day (BID) | ORAL | Status: DC
  Administered 2021-06-22 – 2021-06-29 (×14): 500 mg via ORAL
  Filled 2021-06-22 (×14): qty 1

## 2021-06-22 MED ORDER — CARVEDILOL 3.125 MG PO TABS
3.1250 mg | ORAL_TABLET | Freq: Two times a day (BID) | ORAL | Status: DC
  Administered 2021-06-22 – 2021-06-29 (×13): 3.125 mg via ORAL
  Filled 2021-06-22 (×13): qty 1

## 2021-06-22 MED ORDER — GUAIFENESIN-DM 100-10 MG/5ML PO SYRP: 5.0000 mL | ORAL_SOLUTION | Freq: Four times a day (QID) | ORAL | Status: DC | PRN

## 2021-06-22 MED ORDER — HYDROCERIN EX CREA
TOPICAL_CREAM | Freq: Two times a day (BID) | CUTANEOUS | Status: DC
  Filled 2021-06-22: qty 113

## 2021-06-22 MED ORDER — ACETAMINOPHEN 325 MG PO TABS: 325.0000 mg | ORAL_TABLET | ORAL | Status: DC | PRN

## 2021-06-22 MED ORDER — PROCHLORPERAZINE MALEATE 5 MG PO TABS: 5.0000 mg | ORAL_TABLET | Freq: Four times a day (QID) | ORAL | Status: DC | PRN

## 2021-06-22 MED ORDER — SACUBITRIL-VALSARTAN 24-26 MG PO TABS
1.0000 | ORAL_TABLET | Freq: Two times a day (BID) | ORAL | Status: DC
  Administered 2021-06-22 – 2021-06-29 (×14): 1 via ORAL
  Filled 2021-06-22 (×15): qty 1

## 2021-06-22 MED ORDER — INSULIN ASPART 100 UNIT/ML IJ SOLN
0.0000 [IU] | Freq: Three times a day (TID) | INTRAMUSCULAR | Status: DC
  Administered 2021-06-24 – 2021-06-29 (×7): 1 [IU] via SUBCUTANEOUS

## 2021-06-22 MED ORDER — CALCITRIOL 0.25 MCG PO CAPS
0.5000 ug | ORAL_CAPSULE | Freq: Every day | ORAL | Status: DC
  Administered 2021-06-23 – 2021-06-29 (×7): 0.5 ug via ORAL
  Filled 2021-06-22 (×7): qty 2

## 2021-06-22 MED ORDER — METHOCARBAMOL 500 MG PO TABS
500.0000 mg | ORAL_TABLET | Freq: Four times a day (QID) | ORAL | Status: DC | PRN
  Administered 2021-06-27 – 2021-06-29 (×4): 500 mg via ORAL
  Filled 2021-06-22 (×5): qty 1

## 2021-06-22 MED ORDER — CHLORHEXIDINE GLUCONATE CLOTH 2 % EX PADS: 6.0000 | MEDICATED_PAD | Freq: Every day | CUTANEOUS | Status: DC

## 2021-06-22 MED ORDER — IPRATROPIUM-ALBUTEROL 0.5-2.5 (3) MG/3ML IN SOLN: 3.0000 mL | Freq: Four times a day (QID) | RESPIRATORY_TRACT | Status: DC | PRN

## 2021-06-22 MED ORDER — FERROUS SULFATE 325 (65 FE) MG PO TABS
325.0000 mg | ORAL_TABLET | Freq: Every day | ORAL | Status: DC
  Administered 2021-06-23 – 2021-06-29 (×7): 325 mg via ORAL
  Filled 2021-06-22 (×7): qty 1

## 2021-06-22 NOTE — TOC Transition Note (Signed)
Transition of Care [facility]) - CM/SW Discharge Note   Patient Details  Name: Jonathan Mcmillan MRN: [id number] Date of Birth: 19-Feb-1996  Transition of Care [facility]) CM/SW Contact:  Candie Chroman, LCSW Phone Number: 06/22/2021, 11:30 AM   Clinical Narrative:   Patient has orders to discharge to [facility] today and they have a bed available. RN will call report. Admissions coordinator will arrange [place] transport. No further concerns. CSW signing off.  Final next level of care: IP Rehab Facility Barriers to Discharge: Barriers Resolved   Patient Goals and CMS Choice Patient states their goals for this hospitalization and ongoing recovery are:: intubated      Discharge Placement                Patient to be transferred to facility by: [facility]   Patient and family notified of of transfer: 06/22/21  Discharge Plan and Services   Discharge Planning Services: CM Consult                                 Social Determinants of Health ([place]) Interventions     Readmission Risk Interventions Readmission Risk Prevention Plan 06/13/2020  Transportation Screening Complete  Palliative Care Screening Not Applicable  Medication Review (RN Care Manager) Complete  Some recent data might be hidden

## 2021-06-22 NOTE — Progress Notes (Signed)
Patient is wishing to be discharged to CIR.  Patient seen and examined at bedside.  He is currently medically stable for discharge.  DC'd enteric precautions on 06/21/2021 after discussing with GI.  Follow pathology from colonoscopy.  Continue dialysis as per nephrology schedule.  Please refer to the full discharge summary done by Dr. Verlon Au on 06/20/2021 for full details.

## 2021-06-22 NOTE — Progress Notes (Signed)
Attempted to call report to receiving unit. they said they couldn't take report bc they didn't know anything about them getting this patient yet. I left my phone number and told them that  transport picking him up at 1 pm. Waiting for call back

## 2021-06-22 NOTE — Consult Note (Incomplete)
hypertension, insulin dependent diabetes mellitus type I, diabetic gastroparesis, diabetic neuropathy, tobacco use, THC use, right toe amputation, perirectal abscess, who is admitted to [facility] on 05/31/2021 for SIRS (systemic inflammatory response syndrome) ([facility]) [R65.10]  Acute renal failure superimposed on stage 5 chronic kidney disease, not on chronic dialysis, unspecified acute renal failure type ([place]) [N17.9, N18.5] ]   Cardiac arrest with code blue 2021-06-21 at 10 am. Transferred to ICU. Intubated   End-stage renal disease requiring hemodialysis. Was f/b  [facility] nephrology. Dr Radene Knee, last visit 08/01/20 for CKD V. Creat 13 on admit came in for fevers, chills. Seen by renal team, b/l creat was 4.1, hx  nephrotic range proteinuria and hematuria and lack of sustainable renal recovery, they felt pt was ESRD. Pt coded on the floor 2022/06/21 and moved to ICU and started on CRRT same day. Extubated 1/26 and off vasopressors, CRRT dc 1/26. iHD 1/30, 2/01. [place] placed 2/03. outpatient chair time at [facility] on a MWF schedule.  HD 2/08. Transferring to [facility] for CIR on 2/09.     Acute respiratory failure Extubated then re-intubated 06/03/2021; extubated 06/08/2021 -Respiratory status stable   Hypotension with cardiogenic shock -Blood pressure 114/69 during dialysis -Prescribed amiodarone   4. Insulin dependent Diabetes type 1 with CKD and proteinuria

## 2021-06-22 NOTE — Progress Notes (Signed)
Jonathan Heys, MD  Physician CASE MANAGEMENT PMR Pre-admission     Signed Date of Service:  06/18/2021 11:11 AM  Related encounter: ED to Hosp-Admission (Discharged) from 05/31/2021 in [place]   Signed                                                                                                                                                                                                                                                                                                                                                                                                                                                                                                      PMR Admission Coordinator Pre-Admission Assessment   Patient: Jonathan Mcmillan is an 26 y.o., male MRN: [id number] DOB: June 28, 1995 Height: 5' 7.99" (172.7 cm) Weight: 66.9 kg   Insurance Information   PRIMARY: Uninsured - self pay - no insurance   SECONDARY:       Policy#:      Phone#:    Development worker, community:       Phone#:    The Data  Collection Information Summary for patients in Inpatient Rehabilitation Facilities with attached Privacy Act [place] Records was provided and verbally reviewed with: Patient   Emergency Contact Information Contact Information       Name Relation Home Work Mobile    [place] [phone]   [place] [phone]        [place], [place]     [phone]           Current Medical History  Patient Admitting Diagnosis: Debility, ARF, Respiratory Failure    History of Present Illness: Jonathan Mcmillan is a 26 y.o. male with medical history significant for diabetes mellitus with complications of stage IV chronic kidney  disease, hypertension, nicotine dependence who presented to [facility] emergency room 05/31/21 for evaluation of a 3-day history of fever, chills, nonproductive cough, myalgias and diarrhea. Upon arrival to the ER he was noted to have a T-max of 102.6, he was tachycardic and tachypneic. Sodium 131, potassium 4.4, chloride 102, bicarb 15, glucose 177, BUN 19, creatinine 13.86 when compared to baseline of 3.92, calcium 6.7, alkaline phosphatase 70, albumin 2.9, lipase 29, AST 24, ALT 33, total protein 6.3, lactic acid 2.1, white count 12.7, hemoglobin 5.2 compared to baseline of 8.3, hematocrit 15.8, MCV 89.8, RDW 12.8, platelet count 172, PT 15.8, INR 1.3 Respiratory viral panel was  negative, Urine analysis showed proteinuria.  Twelve-lead EKG reviewed by me shows sinus tachycardia with nonspecific T wave changes in the lateral leads.. Pt suffered in-hospital cardiac arrest, suspect due to severe metabolic acidosis & multiple metabolic derangements. Required CRRT, then transitioned to HD. Pt was extubated on 1/21 but reintubated 1/22. Extubated again on 1/26 & now weaned to room air.PT and OT saw this Pt. And recommended CIR to assist return to PLOF.      Patient's medical record from [facility] has been reviewed by the rehabilitation admission coordinator and physician.   Past Medical History      Past Medical History:  Diagnosis Date   Cannabinoid hyperemesis syndrome     CKD (chronic kidney disease), stage IV (HCC)     Diabetes 1.5, managed as type 1 ([place])     DKA (diabetic ketoacidoses) 05/21/2016   Gastroparesis     HTN (hypertension)     Nicotine dependence     Perirectal abscess 06/16/2017   Right arm cellulitis 01/26/2018      Has the patient had major surgery during 100 days prior to admission? Yes   Family History   family history includes Diabetes in his mother.   Current Medications   Current Facility-Administered Medications:    0.9 %   sodium chloride infusion, , Intravenous, PRN, Lucky Cowboy, Erskine Squibb, MD, Stopped at 06/12/21 2313   amiodarone (PACERONE) tablet 200 mg, 200 mg, Oral, Daily, Dew, Erskine Squibb, MD, 200 mg at 06/18/21 2563   ascorbic acid (VITAMIN C) tablet 500 mg, 500 mg, Oral, BID, Dew, Erskine Squibb, MD, 500 mg at 06/18/21 0935   calcitRIOL (ROCALTROL) capsule 0.5 mcg, 0.5 mcg, Oral, Daily, Korrapati, Madhu, MD, 0.5 mcg at 06/18/21 0935   chlorhexidine gluconate (MEDLINE KIT) (PERIDEX) 0.12 % solution 15 mL, 15 mL, Mouth Rinse, BID, Dew, Erskine Squibb, MD, 15 mL at 06/17/21 2220   Chlorhexidine Gluconate Cloth 2 % PADS 6 each, 6 each, Topical, Daily, Algernon Huxley, MD, 6 each at 06/16/21 1555   Chlorhexidine Gluconate Cloth 2 % PADS 6 each, 6 each, Topical, Q0600, Algernon Huxley,  MD, 6 each at 06/18/21 0526   collagenase (SANTYL) ointment, , Topical, Daily, Dew, Erskine Squibb, MD, Given at 06/16/21 0927   docusate (COLACE) 50 MG/5ML liquid 100 mg, 100 mg, Oral, BID PRN, Lucky Cowboy, Erskine Squibb, MD   feeding supplement (NEPRO CARB STEADY) liquid 237 mL, 237 mL, Oral, BID BM, Dew, Erskine Squibb, MD, 237 mL at 06/16/21 0927   fentaNYL (SUBLIMAZE) injection 50 mcg, 50 mcg, Intravenous, Q2H PRN, Algernon Huxley, MD, 50 mcg at 06/15/21 1433   ferrous sulfate tablet 325 mg, 325 mg, Oral, Q breakfast, Hall, Carole N, DO, 325 mg at 06/18/21 0935   guaiFENesin-dextromethorphan (ROBITUSSIN DM) 100-10 MG/5ML syrup 10 mL, 10 mL, Oral, Q6H PRN, Dew, Erskine Squibb, MD   hydrocerin (EUCERIN) cream, , Topical, BID, Algernon Huxley, MD, Given at 06/17/21 2219   insulin aspart (novoLOG) injection 0-15 Units, 0-15 Units, Subcutaneous, TID WC, Algernon Huxley, MD, 3 Units at 06/18/21 0933   insulin glargine-yfgn (SEMGLEE) injection 5 Units, 5 Units, Subcutaneous, QHS, Dew, Erskine Squibb, MD, 5 Units at 06/16/21 0024   ipratropium-albuterol (DUONEB) 0.5-2.5 (3) MG/3ML nebulizer solution 3 mL, 3 mL, Nebulization, Q6H PRN, Algernon Huxley, MD, 3 mL at 06/10/21 0808   lip balm (BLISTEX) ointment, , Topical, PRN,  Algernon Huxley, MD, 1 application at 62/70/35 1829   MEDLINE mouth rinse, 15 mL, Mouth Rinse, BID, Dew, Erskine Squibb, MD, 15 mL at 06/17/21 2222   multivitamin (RENA-VIT) tablet 1 tablet, 1 tablet, Oral, QHS, Dew, Erskine Squibb, MD, 1 tablet at 06/17/21 2220   ondansetron (ZOFRAN) tablet 4 mg, 4 mg, Oral, Q6H PRN **OR** ondansetron (ZOFRAN) injection 4 mg, 4 mg, Intravenous, Q6H PRN, Algernon Huxley, MD, 4 mg at 06/11/21 0255   oxyCODONE (Oxy IR/ROXICODONE) immediate release tablet 5 mg, 5 mg, Oral, Q4H PRN, Algernon Huxley, MD, 5 mg at 06/18/21 0935   pantoprazole (PROTONIX) EC tablet 40 mg, 40 mg, Oral, Daily, Dew, Erskine Squibb, MD, 40 mg at 06/18/21 0935   polyvinyl alcohol (LIQUIFILM TEARS) 1.4 % ophthalmic solution 2 drop, 2 drop, Both Eyes, PRN, Samuella Cota, MD, 2 drop at 06/17/21 2221   sodium chloride flush (NS) 0.9 % injection 10-40 mL, 10-40 mL, Intracatheter, Q12H, Dew, Jason S, MD, 10 mL at 06/17/21 2223   sodium chloride flush (NS) 0.9 % injection 10-40 mL, 10-40 mL, Intracatheter, PRN, Algernon Huxley, MD, 10 mL at 06/11/21 0908   thiamine tablet 100 mg, 100 mg, Oral, Daily, Dorothe Pea, RPH, 100 mg at 06/18/21 0935   zinc sulfate capsule 220 mg, 220 mg, Oral, Daily, Dew, Erskine Squibb, MD, 220 mg at 06/18/21 0935   Patients Current Diet:  Diet Order                  Diet NPO time specified  Diet effective midnight             Diet renal/carb modified with fluid restriction Diet-HS Snack? Nothing; Fluid restriction: 1200 mL Fluid; Room service appropriate? Yes; Fluid consistency: Thin  Diet effective now                         Precautions / Restrictions Precautions Precautions: Fall Precaution Comments: R temporary femoral dialysis catheter: per Dr. Holley Raring, nephrology, ok for SPTs bed to/from chair, no walking, do not elevate HOB or chair past 60 deg Restrictions Weight Bearing Restrictions: No    Has the patient had 2 or more  falls or a fall with injury in the past year? No   Prior  Activity Level Community (5-7x/wk): Pt. active in the community and working PTA   Prior Functional Level Self Care: Did the patient need help bathing, dressing, using the toilet or eating? Independent   Indoor Mobility: Did the patient need assistance with walking from room to room (with or without device)? Independent   Stairs: Did the patient need assistance with internal or external stairs (with or without device)? Independent   Functional Cognition: Did the patient need help planning regular tasks such as shopping or remembering to take medications? Independent   Patient Information Are you of Hispanic, Latino/a,or Spanish origin?: A. No, not of Hispanic, Latino/a, or Spanish origin What is your race?: B. Black or African American Do you need or want an interpreter to communicate with a doctor or health care staff?: 0. No   Patient's Response To:  Health Literacy and Transportation Is the patient able to respond to health literacy and transportation needs?: Yes Health Literacy - How often do you need to have someone help you when you read instructions, pamphlets, or other written material from your doctor or pharmacy?: Never In the past 12 months, has lack of transportation kept you from medical appointments or from getting medications?: No In the past 12 months, has lack of transportation kept you from meetings, work, or from getting things needed for daily living?: No   Development worker, international aid / [place] Devices/Equipment: None   Prior Device Use: Indicate devices/aids used by the patient prior to current illness, exacerbation or injury? None of the above   Current Functional Level Cognition   Overall Cognitive Status: Within Functional Limits for tasks assessed Orientation Level: Oriented X4 General Comments: Pt is oriented x4, appropriate, but slow to respond    Extremity Assessment (includes Sensation/Coordination)   Upper Extremity Assessment: Generalized  weakness  Lower Extremity Assessment: Generalized weakness     ADLs   Overall ADL's : Needs assistance/impaired General ADL Comments: MAX A don/doff B socks at bed level. MOD A bathing at bed level - pt completes UB with cues for thoroughness. MAX A x2 toileting at bed level - pt continues to be incontinent of stool - RN in to place rectal tube. MIN A don/doff gown at bed level. MOD A x2 for ADL t/f     Mobility   Overal bed mobility: Needs Assistance Bed Mobility: Supine to Sit Rolling: Min guard Supine to sit: Min assist Sit to supine: Mod assist General bed mobility comments: assist for BLE and trunk control     Transfers   Overall transfer level: Needs assistance Equipment used: 2 person hand held assist Transfers: Bed to chair/wheelchair/BSC Bed to/from chair/wheelchair/BSC transfer type:: Stand pivot Stand pivot transfers: Mod assist, From elevated surface General transfer comment: Mod A and mod verbal and visual cues for sequencing from elevated EOB     Ambulation / Gait / Stairs / Wheelchair Mobility   Ambulation/Gait General Gait Details: N/A, contraindicated     Posture / Balance Balance Overall balance assessment: Needs assistance Sitting-balance support: No upper extremity supported, Feet supported Sitting balance-Leahy Scale: Fair Standing balance support: Bilateral upper extremity supported Standing balance-Leahy Scale: Poor     Special needs/care consideration Dialysis: Hemodialysis Monday, Wednesday, and Friday, Skin B Heel wounds, buttocks wound and sacral wounds, and Diabetic management Yes, h/o DM    Previous Home Environment (from acute therapy documentation) Living Arrangements: Other relatives (2 aunts)  Lives  With: Family Available Help at Discharge: Family, Available PRN/intermittently Type of Home: House Home Layout: One level Home Access: Ramped entrance Entrance Stairs-Number of Steps: 6 Bathroom Shower/Tub: Tub only, Print production planner: Handicapped height Bathroom Accessibility: Yes How Accessible: Accessible via walker, Accessible via wheelchair [place]: No   Discharge Living Setting Plans for Discharge Living Setting: Patient's home Type of Home at Discharge: House Discharge Home Layout: One level Discharge Home Access: [place] entrance Discharge [place]: Handicapped height Discharge Bathroom Accessibility: Yes How Accessible: Accessible via wheelchair, Accessible via walker Does the patient have any problems obtaining your medications?: No   Social/Family/Support Systems Patient Roles: Other (Comment) Contact Information: [phone] Anticipated Caregiver: Lynelle Smoke (aunt) Ability/Limitations of Caregiver: Works days, but other family will be there with Pt. while she works Careers adviser: 24/7 Discharge Plan Discussed with Primary Caregiver: Yes Is Caregiver In Agreement with Plan?: Yes Does Caregiver/Family have Issues with Lodging/Transportation while Pt is in Rehab?: Yes   Goals Patient/Family Goal for Rehab: PT/OT/SLP Supervisoin Expected length of stay: 12-14 days Pt/Family Agrees to Admission and willing to participate: Yes Program Orientation Provided & Reviewed with Pt/Caregiver Including Roles  & Responsibilities: Yes   Decrease burden of Care through IP rehab admission: Specialzed equipment needs, Decrease number of caregivers, Bowel and bladder program, and Patient/family education   Possible need for SNF placement upon discharge: not anticipated   Patient Condition: I have reviewed medical records from [facility], spoken with CM, and patient. I met with patient at the bedside for inpatient rehabilitation assessment.  Patient will benefit from ongoing PT and OT, can actively participate in 3 hours of therapy a day 5 days of the week, and can make measurable gains during the admission.  Patient will also benefit from the coordinated team approach  during an Inpatient Acute Rehabilitation admission.  The patient will receive intensive therapy as well as Rehabilitation physician, nursing, social worker, and care management interventions.  Due to bladder management, bowel management, safety, skin/wound care, disease management, medication administration, pain management, and patient education the patient requires 24 hour a day rehabilitation nursing.  The patient is currently minimum assist with mobility and basic ADLs.  Discharge setting and therapy post discharge at home with home health is anticipated.  Patient has agreed to participate in the Acute Inpatient Rehabilitation Program and will admit today.   Preadmission Screen Completed By:  Genella Mech, 06/18/2021 11:12 AM ______________________________________________________________________   Discussed status with Dr. Dagoberto Ligas on 06/22/21 at 1000 and received approval for admission today.   Admission Coordinator:  Genella Mech, CCC-SLP, time 1126/Date 06/22/21    Assessment/Plan: Diagnosis: Does the need for close, 24 hr/day Medical supervision in concert with the patient's rehab needs make it unreasonable for this patient to be served in a less intensive setting? Yes Co-Morbidities requiring supervision/potential complications: new ESRD, cardiac arrest, DKA, DM1, s/p resp failure, debility Due to bladder management, bowel management, safety, skin/wound care, disease management, medication administration, pain management, and patient education, does the patient require 24 hr/day rehab nursing? Yes Does the patient require coordinated care of a physician, rehab nurse, PT, OT, and SLP to address physical and functional deficits in the context of the above medical diagnosis(es)? Yes Addressing deficits in the following areas: balance, endurance, locomotion, strength, transferring, bowel/bladder control, bathing, dressing, feeding, grooming, and toileting Can the patient actively participate in an  intensive therapy program of at least 3 hrs of therapy 5 days a week? Yes The  potential for patient to make measurable gains while on inpatient rehab is good Anticipated functional outcomes upon discharge from inpatient rehab: modified independent and supervision PT, modified independent and supervision OT, modified independent and supervision SLP Estimated rehab length of stay to reach the above functional goals is: 12-14 days Anticipated discharge destination: Home 10. Overall Rehab/Functional Prognosis: good     MD Signature:           Revision History                                         Note Details  Jan Fireman, MD File Time 06/22/2021 12:00 PM  Author Type Physician Status Signed  Last Editor Jonathan Mcmillan, [place] # [id number] [place] Date 06/22/2021

## 2021-06-22 NOTE — Consult Note (Addendum)
Renal Service Consult Note [facility]  Jonathan Mcmillan 06/22/2021 Sol Blazing, MD Requesting Physician: Dr Naaman Plummer  Reason for Consult: new ESRD pt admitted to CIR HPI: The patient is a 26 y.o. year-old w/ CKD V who presented to [facility] on 05/31/21 w/ fevers and was admitted. He coded next morning and was intubated and moved to ICU. B/l creat was 4.1 and was 13 on admission. CRRT was started on 1/19 and dc'd on 1/26. He has rec'd iHD since then, [facility] was placed on 2/03 and he was been assigned to [name] on MWF schedule [facility]. Last HD was 2/08. Pt transferred to [facility] CIR for rehab.   Pt seen in room. Not much to say. No CP or SOB.    ROS - denies CP, no joint pain, no HA, no blurry vision, no rash, no diarrhea, no nausea/ vomiting, no dysuria, no difficulty voiding   Past Medical History  Past Medical History:  Diagnosis Date   Cannabinoid hyperemesis syndrome    CKD (chronic kidney disease), stage IV (HCC)    Diabetes 1.5, managed as type 1 ([place])    DKA (diabetic ketoacidoses) 05/21/2016   Gastroparesis    HTN (hypertension)    Nicotine dependence    Perirectal abscess 06/16/2017   Right arm cellulitis 01/26/2018   Past Surgical History  Past Surgical History:  Procedure Laterality Date   COLONOSCOPY WITH PROPOFOL N/A 06/21/2021   Procedure: COLONOSCOPY WITH PROPOFOL;  Surgeon: Lin Landsman, MD;  Location: [facility] ENDOSCOPY;  Service: Gastroenterology;  Laterality: N/A;   DIALYSIS/PERMA CATHETER INSERTION N/A 06/15/2021   Procedure: DIALYSIS/PERMA CATHETER INSERTION;  Surgeon: Algernon Huxley, MD;  Location: [place] CV LAB;  Service: Cardiovascular;  Laterality: N/A;   DIALYSIS/PERMA CATHETER INSERTION N/A 06/19/2021   Procedure: DIALYSIS/PERMA CATHETER INSERTION;  Surgeon: Algernon Huxley, MD;  Location: [place] CV LAB;  Service: Cardiovascular;  Laterality: N/A;   INCISION AND DRAINAGE PERIRECTAL ABSCESS N/A 06/16/2017   Procedure: IRRIGATION AND DEBRIDEMENT  PERIRECTAL ABSCESS;  Surgeon: Florene Glen, MD;  Location: [facility] ORS;  Service: General;  Laterality: N/A;   none     Family History  Family History  Problem Relation Age of Onset   Diabetes Mother    Social History  reports that he has been smoking cigarettes. He has been smoking an average of .5 packs per day. He has never used smokeless tobacco. He reports current drug use. Drug: Marijuana. He reports that he does not drink alcohol. Allergies No Known Allergies Home medications Prior to Admission medications   Medication Sig Start Date End Date Taking? Authorizing Provider  amiodarone (PACERONE) 200 MG tablet Take 1 tablet (200 mg total) by mouth daily. 06/21/21   Nita Sells, MD  calcitRIOL (ROCALTROL) 0.5 MCG capsule Take 1 capsule (0.5 mcg total) by mouth daily. 06/21/21   Nita Sells, MD  calcium carbonate (TUMS - DOSED IN MG ELEMENTAL CALCIUM) 500 MG chewable tablet Chew 1 tablet (200 mg of elemental calcium total) by mouth 3 (three) times daily. 06/20/21   Nita Sells, MD  carvedilol (COREG) 3.125 MG tablet Take 1 tablet (3.125 mg total) by mouth 2 (two) times daily with a meal. 06/21/21   Nita Sells, MD  cinacalcet (SENSIPAR) 30 MG tablet Take 1 tablet (30 mg total) by mouth daily with breakfast. 06/21/21   Nita Sells, MD  collagenase (SANTYL) ointment Apply topically daily. 06/21/21   Nita Sells, MD  ferrous sulfate 325 (65 FE) MG tablet Take  1 tablet (325 mg total) by mouth daily with breakfast. 06/21/21   Nita Sells, MD  hydrocerin (EUCERIN) CREA Apply 1 application topically 2 (two) times daily. 06/20/21   Nita Sells, MD  insulin aspart (NOVOLOG) 100 UNIT/ML injection Inject 0-15 Units into the skin 3 (three) times daily with meals. 06/20/21   Nita Sells, MD  lipase/protease/amylase (CREON) 36000 UNITS CPEP capsule Take 1 capsule (36,000 Units total) by mouth 3 (three) times daily with meals. 06/21/21   Aline August, MD  loperamide (IMODIUM) 2 MG capsule Take 1 capsule (2 mg total) by mouth 2 (two) times daily. 06/20/21   Nita Sells, MD  multivitamin (RENA-VIT) TABS tablet Take 1 tablet by mouth at bedtime. 06/20/21   Nita Sells, MD  omeprazole (PRILOSEC) 40 MG capsule Take 1 capsule (40 mg total) by mouth daily. 03/08/20 05/07/20  Arta Silence, MD  ondansetron (ZOFRAN) 4 MG tablet Take 1 tablet (4 mg total) by mouth every 8 (eight) hours as needed for up to 10 doses for nausea or vomiting. 05/31/21   Lucrezia Starch, MD  oxyCODONE (OXY IR/ROXICODONE) 5 MG immediate release tablet Take 1 tablet (5 mg total) by mouth every 4 (four) hours as needed for severe pain. 06/20/21   Nita Sells, MD  sacubitril-valsartan (ENTRESTO) 24-26 MG Take 1 tablet by mouth 2 (two) times daily. 06/20/21   Nita Sells, MD  pantoprazole (PROTONIX) 40 MG tablet Take 1 tablet (40 mg total) by mouth daily. Patient not taking: Reported on 12/10/2018 09/10/18 01/02/20  Hillary Bow, MD  sucralfate (CARAFATE) 1 g tablet Take 1 tablet (1 g total) by mouth 4 (four) times daily. Patient not taking: Reported on 06/26/2019 02/04/19 01/02/20  Nance Pear, MD     Vitals:   06/22/21 1410  BP: 110/65  Pulse: 77  Resp: 16  Temp: 97.9 F (36.6 C)  TempSrc: Oral  SpO2: 100%  Weight: 68.9 kg  Height: 5\' 8"  (1.727 m)   Exam Gen alert, no distress, chronically ill appearing No rash, cyanosis or gangrene Sclera anicteric, throat clear  No jvd or bruits Chest clear bilat to bases, no rales/ wheezing RRR no RG Abd soft obese vs possible ascites, ntnd +bs Flank/ lateral abd wall edema 2+ bilat GU normal male MS no joint effusions or deformity Ext no LE or UE edema, +wounds lower leg Neuro is alert, Ox 3 , nf, no asterixis   Assessment/ Plan: Debility - on CIR now ESRD - new start to HD, was CKD V w/ creat 4 at baseline, admitted w/ creat 13. Pt rec'd CRRT for 1 week then was transitioned  to iHD. [place] placed on 2/03. Does not have AVF yet. Will plan HD tomorrow (last HD 2/08). HD time will be shortened due to high census/ staffing issues.   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

## 2021-06-22 NOTE — Progress Notes (Signed)
[facility]  ROUNDING NOTE   Subjective:   Patient seen sitting at side of bed, performing ADLs with minimal assistance Denies nausea and vomiting, tolerating meals Denies shortness of breath Denies pain or discomfort  Dialysis received yesterday, tolerated  Objective:  Vital signs in last 24 hours:  Temp:  [97.8 F (36.6 C)-98.6 F (37 C)] 98.5 F (36.9 C) (02/09 0831) Pulse Rate:  [78-92] 88 (02/09 0831) Resp:  [11-20] 18 (02/09 0831) BP: (109-154)/(67-94) 128/91 (02/09 0831) SpO2:  [96 %-100 %] 100 % (02/09 0831) Weight:  [66.3 kg-69.4 kg] 69.4 kg (02/09 0500)  Weight change: -3.1 kg Filed Weights   06/21/21 1254 06/21/21 1610 06/22/21 0500  Weight: 66.6 kg 66.3 kg 69.4 kg    Intake/Output: I/O last 3 completed shifts: In: 120 [P.O.:120] Out: 501 [Other:501]   Intake/Output this shift:  Total I/O In: 120 [P.O.:120] Out: -   Physical Exam: General: No acute distress  Eyes: Anicteric  Lungs:  Clear to auscultation, normal effort  Heart: S1S2, no rubs  Abdomen:  Soft, nontender, BS present  Extremities: No peripheral edema.  Neurologic: Awake, alert, no rbus  Skin: Peeling skin noted  Access:  Rt permcath placed on [place] on 06/15/21, exchanged on 06/19/21 by Dr Lucky Cowboy    Basic Metabolic Panel: Recent Labs  Lab 06/16/21 0543 06/17/21 0613 06/19/21 0905 06/20/21 0437  NA 134* 135 138 136  K 4.9 4.4 4.8 4.5  CL 104 104 106 104  CO2 _0 GLUCOSE 207* 172* 98 90  BUN 60* 38* 50* 30*  CREATININE 4.31* 3.43* 5.03* 3.50*  CALCIUM 6.3* 6.6* 6.7* 6.9*  MG 2.3  --   --   --   PHOS 4.5 3.8 5.2* 4.4     Liver Function Tests: Recent Labs  Lab 06/16/21 0543 06/17/21 0613 06/19/21 0905 06/20/21 0437  AST  --   --   --  14*  ALT  --   --   --  5  ALKPHOS  --   --   --  133*  BILITOT  --   --   --  0.9  PROT  --   --   --  4.9*  ALBUMIN 1.6* 1.7* 1.8* 1.6*   1.6*    No results for input(s): LIPASE, AMYLASE in the last 168  hours.  No results for input(s): AMMONIA in the last 168 hours.   CBC: Recent Labs  Lab 06/16/21 0509 06/17/21 0613 06/19/21 0905 06/20/21 0437  WBC 7.0 6.3 9.2 10.1  HGB 8.1* 8.1* 7.9* 7.4*  HCT 24.4* 25.2* 25.0* 23.0*  MCV 88.7 91.6 92.6 93.1  PLT 63* 58* 70* 70*     Cardiac Enzymes: No results for input(s): CKTOTAL, CKMB, CKMBINDEX, TROPONINI in the last 168 hours.   BNP: Invalid input(s): POCBNP  CBG: Recent Labs  Lab 06/21/21 1135 06/21/21 1642 06/21/21 2123 06/22/21 0830 06/22/21 1133  GLUCAP 95 97 181* 173* 209*     Microbiology: Results for orders placed or performed during the hospital encounter of 05/31/21  Resp Panel by RT-PCR (Flu A&B, Covid) Nasopharyngeal Swab     Status: None   Collection Time: 05/31/21  6:58 AM   Specimen: Nasopharyngeal Swab; Nasopharyngeal(NP) swabs in vial transport medium  Result Value Ref Range Status   SARS Coronavirus 2 by RT PCR NEGATIVE NEGATIVE Final    Comment: (NOTE) SARS-CoV-2 target nucleic acids are NOT DETECTED.  The SARS-CoV-2 RNA is generally detectable in upper respiratory specimens  during the acute phase of infection. The lowest concentration of SARS-CoV-2 viral copies this assay can detect is 138 copies/mL. A negative result does not preclude SARS-Cov-2 infection and should not be used as the sole basis for treatment or other patient management decisions. A negative result may occur with  improper specimen collection/handling, submission of specimen other than nasopharyngeal swab, presence of viral mutation(s) within the areas targeted by this assay, and inadequate number of viral copies(<138 copies/mL). A negative result must be combined with clinical observations, patient history, and epidemiological information. The expected result is Negative.  Fact Sheet for Patients:  EntrepreneurPulse.com.au  Fact Sheet for Healthcare Providers:   IncredibleEmployment.be  This test is no t yet approved or cleared by the [place] FDA and  has been authorized for detection and/or diagnosis of SARS-CoV-2 by FDA under an Emergency Use Authorization (EUA). This EUA will remain  in effect (meaning this test can be used) for the duration of the COVID-19 declaration under Section 564(b)(1) of the Act, 21 U.S.C.section 360bbb-3(b)(1), unless the authorization is terminated  or revoked sooner.       Influenza A by PCR NEGATIVE NEGATIVE Final   Influenza B by PCR NEGATIVE NEGATIVE Final    Comment: (NOTE) The Xpert Xpress SARS-CoV-2/FLU/RSV plus assay is intended as an aid in the diagnosis of influenza from Nasopharyngeal swab specimens and should not be used as a sole basis for treatment. Nasal washings and aspirates are unacceptable for Xpert Xpress SARS-CoV-2/FLU/RSV testing.  Fact Sheet for Patients: EntrepreneurPulse.com.au  Fact Sheet for Healthcare Providers: IncredibleEmployment.be  This test is not yet approved or cleared by the [place] FDA and has been authorized for detection and/or diagnosis of SARS-CoV-2 by FDA under an Emergency Use Authorization (EUA). This EUA will remain in effect (meaning this test can be used) for the duration of the COVID-19 declaration under Section 564(b)(1) of the Act, 21 U.S.C. section 360bbb-3(b)(1), unless the authorization is terminated or revoked.  Performed at [facility], [place], [place] [address]   Respiratory (~20 pathogens) panel by PCR     Status: None   Collection Time: 05/31/21  6:58 AM   Specimen: Nasopharyngeal Swab; Respiratory  Result Value Ref Range Status   Adenovirus NOT DETECTED NOT DETECTED Final   Coronavirus 229E NOT DETECTED NOT DETECTED Final    Comment: (NOTE) The Coronavirus on the Respiratory Panel, DOES NOT test for the novel  Coronavirus (2019 nCoV)     Coronavirus HKU1 NOT DETECTED NOT DETECTED Final   Coronavirus NL63 NOT DETECTED NOT DETECTED Final   Coronavirus OC43 NOT DETECTED NOT DETECTED Final   Metapneumovirus NOT DETECTED NOT DETECTED Final   Rhinovirus / Enterovirus NOT DETECTED NOT DETECTED Final   Influenza A NOT DETECTED NOT DETECTED Final   Influenza B NOT DETECTED NOT DETECTED Final   Parainfluenza Virus 1 NOT DETECTED NOT DETECTED Final   Parainfluenza Virus 2 NOT DETECTED NOT DETECTED Final   Parainfluenza Virus 3 NOT DETECTED NOT DETECTED Final   Parainfluenza Virus 4 NOT DETECTED NOT DETECTED Final   Respiratory Syncytial Virus NOT DETECTED NOT DETECTED Final   Bordetella pertussis NOT DETECTED NOT DETECTED Final   Bordetella Parapertussis NOT DETECTED NOT DETECTED Final   Chlamydophila pneumoniae NOT DETECTED NOT DETECTED Final   Mycoplasma pneumoniae NOT DETECTED NOT DETECTED Final    Comment: Performed at [facility], [place]. [address] [place], [place] [address]  Group A Strep by PCR [facility] Only)  Status: None   Collection Time: 05/31/21  8:35 AM   Specimen: Throat; Sterile Swab  Result Value Ref Range Status   Group A Strep by PCR NOT DETECTED NOT DETECTED Final    Comment: Performed at [facility], [address] [place], [place] [address]  Blood culture (routine x 2)     Status: None   Collection Time: 05/31/21  8:39 AM   Specimen: BLOOD  Result Value Ref Range Status   Specimen Description BLOOD BLOOD RIGHT FOREARM  Final   Special Requests   Final    BOTTLES DRAWN AEROBIC AND ANAEROBIC Blood Culture results may not be optimal due to an excessive volume of blood received in culture bottles   Culture   Final    NO GROWTH 5 DAYS Performed at [facility], [address], [place], [place] [address]    Report Status 06/05/2021 FINAL  Final  Blood culture (routine x 2)     Status: None   Collection Time: 05/31/21  8:39 AM   Specimen: BLOOD  Result Value Ref Range Status    Specimen Description BLOOD RIGHT ANTECUBITAL  Final   Special Requests   Final    BOTTLES DRAWN AEROBIC AND ANAEROBIC Blood Culture results may not be optimal due to an excessive volume of blood received in culture bottles   Culture   Final    NO GROWTH 5 DAYS Performed at [facility], [address] [place], [place] [address]    Report Status 06/05/2021 FINAL  Final  C Difficile Quick Screen w PCR reflex     Status: None   Collection Time: 05/31/21  1:20 PM   Specimen: Stool  Result Value Ref Range Status   C Diff antigen NEGATIVE NEGATIVE Final   C Diff toxin NEGATIVE NEGATIVE Final   C Diff interpretation No C. difficile detected.  Final    Comment: Performed at [facility], [address] [place], [place] [address]  Gastrointestinal Panel by PCR , Stool     Status: None   Collection Time: 05/31/21  1:30 PM   Specimen: Stool  Result Value Ref Range Status   Campylobacter species NOT DETECTED NOT DETECTED Final   Plesimonas shigelloides NOT DETECTED NOT DETECTED Final   Salmonella species NOT DETECTED NOT DETECTED Final   Yersinia enterocolitica NOT DETECTED NOT DETECTED Final   Vibrio species NOT DETECTED NOT DETECTED Final   Vibrio cholerae NOT DETECTED NOT DETECTED Final   Enteroaggregative E coli (EAEC) NOT DETECTED NOT DETECTED Final   Enteropathogenic E coli (EPEC) NOT DETECTED NOT DETECTED Final   Enterotoxigenic E coli (ETEC) NOT DETECTED NOT DETECTED Final   Shiga like toxin producing E coli (STEC) NOT DETECTED NOT DETECTED Final   Shigella/Enteroinvasive E coli (EIEC) NOT DETECTED NOT DETECTED Final   Cryptosporidium NOT DETECTED NOT DETECTED Final   Cyclospora cayetanensis NOT DETECTED NOT DETECTED Final   Entamoeba histolytica NOT DETECTED NOT DETECTED Final   Giardia lamblia NOT DETECTED NOT DETECTED Final   Adenovirus F40/41 NOT DETECTED NOT DETECTED Final   Astrovirus NOT DETECTED NOT DETECTED Final   Norovirus GI/GII NOT DETECTED NOT  DETECTED Final   Rotavirus A NOT DETECTED NOT DETECTED Final   Sapovirus (I, II, IV, and V) NOT DETECTED NOT DETECTED Final    Comment: Performed at [facility], [address] [place],  [address]  Urine Culture     Status: None   Collection Time: 06/01/21 10:23 AM  Specimen: In/Out Cath Urine  Result Value Ref Range Status   Specimen Description   Final    IN/OUT CATH URINE Performed at [facility], [address] [place], [place] [address]    Special Requests   Final    NONE Performed at [facility], [address] [place], [place] [address]    Culture   Final    NO GROWTH Performed at [facility], [place] [address] [place], [place] [address]    Report Status 06/03/2021 FINAL  Final  Culture, Respiratory w Gram Stain     Status: None   Collection Time: 06/01/21  3:59 PM   Specimen: Tracheal Aspirate; Respiratory  Result Value Ref Range Status   Specimen Description   Final    TRACHEAL ASPIRATE Performed at [facility], [address] [place], [place] [address]    Special Requests   Final    NONE Performed at [facility], [place], [place] [address]    Gram Stain   Final    RARE SQUAMOUS EPITHELIAL CELLS PRESENT MODERATE WBC PRESENT,BOTH PMN AND MONONUCLEAR MODERATE GRAM POSITIVE COCCI FEW GRAM NEGATIVE RODS FEW GRAM POSITIVE RODS RARE YEAST    Culture   Final    FEW Consistent with normal respiratory flora. No Pseudomonas species isolated Performed at [facility] [address] [place], [place] [address]    Report Status 06/03/2021 FINAL  Final  CULTURE, BLOOD (ROUTINE X 2) w Reflex to ID Panel     Status: None   Collection Time: 06/01/21  7:06 PM   Specimen: BLOOD  Result Value Ref Range Status   Specimen Description BLOOD RIGHT ANTECUBITAL  Final   Special Requests   Final    BOTTLES DRAWN AEROBIC ONLY Blood Culture results may not be optimal due to an inadequate  volume of blood received in culture bottles   Culture   Final    NO GROWTH 5 DAYS Performed at [facility], [address] [place], [place] [address]    Report Status 06/06/2021 FINAL  Final  CULTURE, BLOOD (ROUTINE X 2) w Reflex to ID Panel     Status: None   Collection Time: 06/01/21  7:37 PM   Specimen: BLOOD  Result Value Ref Range Status   Specimen Description BLOOD [facility]  Final   Special Requests BOTTLES DRAWN AEROBIC AND ANAEROBIC BCLV  Final   Culture   Final    NO GROWTH 5 DAYS Performed at [facility], [address] [place], [place] [address]    Report Status 06/06/2021 FINAL  Final  MRSA Next Gen by PCR, Nasal     Status: None   Collection Time: 06/02/21 10:46 AM   Specimen: Nasal Mucosa; Nasal Swab  Result Value Ref Range Status   MRSA by PCR Next Gen NOT DETECTED NOT DETECTED Final    Comment: (NOTE) The GeneXpert MRSA Assay (FDA approved for NASAL specimens only), is one component of a comprehensive MRSA colonization surveillance program. It is not intended to diagnose MRSA infection nor to guide or monitor treatment for MRSA infections. Test performance is not FDA approved in patients less than 44 years old. Performed at [facility], [address] [place], [place] [address]   Culture, Respiratory w Gram Stain     Status: None   Collection Time: 06/04/21  7:38 AM   Specimen: Tracheal Aspirate; Respiratory  Result Value Ref Range Status   Specimen Description  Final    TRACHEAL ASPIRATE Performed at [facility], [address], [place], [place] [address]    Special Requests   Final    NONE Performed at [facility], [address] [place], [place] [address]    Gram Stain   Final    MODERATE WBC PRESENT,BOTH PMN AND MONONUCLEAR NO ORGANISMS SEEN Performed at [facility], [place] [address], [place], [place] [address]    Culture FEW CANDIDA ALBICANS RARE ESCHERICHIA COLI   Final   Report Status  06/07/2021 FINAL  Final   Organism ID, Bacteria ESCHERICHIA COLI  Final      Susceptibility   Escherichia coli - MIC*    AMPICILLIN >=32 RESISTANT Resistant     CEFAZOLIN <=4 SENSITIVE Sensitive     CEFEPIME <=0.12 SENSITIVE Sensitive     CEFTAZIDIME <=1 SENSITIVE Sensitive     CEFTRIAXONE <=0.25 SENSITIVE Sensitive     CIPROFLOXACIN <=0.25 SENSITIVE Sensitive     GENTAMICIN <=1 SENSITIVE Sensitive     IMIPENEM <=0.25 SENSITIVE Sensitive     TRIMETH/SULFA >=320 RESISTANT Resistant     AMPICILLIN/SULBACTAM >=32 RESISTANT Resistant     PIP/TAZO <=4 SENSITIVE Sensitive     * RARE ESCHERICHIA COLI  CULTURE, BLOOD (ROUTINE X 2) w Reflex to ID Panel     Status: None   Collection Time: 06/04/21  6:35 PM   Specimen: BLOOD  Result Value Ref Range Status   Specimen Description BLOOD BLOOD LEFT HAND  Final   Special Requests   Final    BOTTLES DRAWN AEROBIC AND ANAEROBIC Blood Culture adequate volume   Culture   Final    NO GROWTH 5 DAYS Performed at [facility], [address], [place], [place] [address]    Report Status 06/09/2021 FINAL  Final  C Difficile Quick Screen (NO PCR Reflex)     Status: None   Collection Time: 06/20/21  7:15 PM   Specimen: STOOL  Result Value Ref Range Status   C Diff antigen NEGATIVE NEGATIVE Final   C Diff toxin NEGATIVE NEGATIVE Final   C Diff interpretation No C. difficile detected.  Final    Comment: Performed at [facility], [address] [place], [place] [address]    Coagulation Studies: No results for input(s): LABPROT, INR in the last 72 hours.   Urinalysis: No results for input(s): COLORURINE, LABSPEC, PHURINE, GLUCOSEU, HGBUR, BILIRUBINUR, KETONESUR, PROTEINUR, UROBILINOGEN, NITRITE, LEUKOCYTESUR in the last 72 hours.  Invalid input(s): APPERANCEUR     Imaging: No results found.   Medications:    sodium chloride Stopped (06/12/21 2313)   sodium chloride 100 mL/hr at 06/21/21 1044    (feeding supplement)  PROSource Plus  30 mL Oral TID BM   amiodarone  200 mg Oral Daily   vitamin C  500 mg Oral BID   calcitRIOL  0.5 mcg Oral Daily   calcium carbonate  1 tablet Oral TID   carvedilol  3.125 mg Oral BID WC   chlorhexidine gluconate (MEDLINE KIT)  15 mL Mouth Rinse BID   Chlorhexidine Gluconate Cloth  6 each Topical Daily   Chlorhexidine Gluconate Cloth  6 each Topical Q0600   cinacalcet  30 mg Oral Q breakfast   collagenase   Topical Daily   ferrous sulfate  325 mg Oral Q breakfast   hydrocerin   Topical BID   insulin aspart  0-15 Units Subcutaneous TID WC   lipase/protease/amylase  36,000 Units Oral TID WC  loperamide  2 mg Oral BID   mouth rinse  15 mL Mouth Rinse BID   multivitamin  1 tablet Oral QHS   pantoprazole  40 mg Oral Daily   sacubitril-valsartan  1 tablet Oral BID   sodium chloride flush  10-40 mL Intracatheter Q12H   thiamine  100 mg Oral Daily   zinc sulfate  220 mg Oral Daily   sodium chloride, guaiFENesin-dextromethorphan, HYDROmorphone (DILAUDID) injection, ipratropium-albuterol, lip balm, ondansetron (ZOFRAN) IV, ondansetron **OR** [DISCONTINUED] ondansetron (ZOFRAN) IV, oxyCODONE, polyvinyl alcohol, sodium chloride flush  Assessment/ Plan:  Jonathan Mcmillan is a 26 y.o. black male with hypertension, insulin dependent diabetes mellitus type I, diabetic gastroparesis, diabetic neuropathy, tobacco use, THC use, right toe amputation, perirectal abscess, who is admitted to [facility] on 05/31/2021 for SIRS (systemic inflammatory response syndrome) ([place]) [R65.10] AKI (acute kidney injury) ([place]) [N17.9] Symptomatic anemia [D64.9] Acute renal failure superimposed on stage 5 chronic kidney disease, not on chronic dialysis, unspecified acute renal failure type ([place]) [N17.9, N18.5] Hypertension, unspecified type [I10] Acute cough [R05.1]  Cardiac arrest with code blue 06/01/2021 at 10 am. Transferred to ICU. Intubated  End-stage renal disease requiring hemodialysis. Based on  patient's history of nephrotic range proteinuria and hematuria and lack of sustainable renal recovery, we feel patient has progressed to end-stage renal disease. Strong family history of dialysis with mother with ESRD before passing. Was on CRRT.  -Dialysis received yesterday, UF goal 500 mL achieved.  Next treatment scheduled for Friday. - Appreciate dialysis coordinator confirming outpatient chair time at [facility] on a MWF schedule.  Currently awaiting insurance approval. -Discharge plans include rehab at [facility].  Outpatient clinic will be available at time of discharge    Acute respiratory failure Extubated then re-intubated 06/03/2021; extubated 06/08/2021 -Respiratory status stable  Hypotension with cardiogenic shock -Blood pressure 114/69 during dialysis -Prescribed amiodarone  4. 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 Thrombocytopenia Hematology evaluation on 1/22 Suspected peripheral destruction Platelets 70,000    LOS: [place] 2/9/202312:46 PM

## 2021-06-22 NOTE — H&P (Signed)
Physical Medicine and Rehabilitation Admission H&P        Chief Complaint  Patient presents with   Generalized Body Aches   Chills  Debility due to renal failure, heart failure, status post PEA arrest   HPI: Jonathan Mcmillan is a 26 year old male who presented to the [facility] emergency department on 05/31/2021 with fever, chills and vomiting.  Initial work-up revealed profound anemia and significantly elevated serum creatinine above baseline level.  Respiratory viral panels including COVID and influenza were negative.  He was admitted for further evaluation and work-up.  Nephrology consultation obtained.   The patient's past medical history includes chronic kidney disease stage IV, hypertension, diabetes mellitus type 1, gastroesophageal reflux disease.  Primary care obtained at [facility].  Followed by Dr. Loney Loh nephrology last seen 08/01/2020.   On 06/01/2021, the patient collapsed while toileting and rapid response was called.  He lost his pulse and CODE BLUE was called.  Was initially asystolic but progressed to PEA.  ACLS protocol carried out and patient was intubated.  Regained pulse and transferred to the ICU.  Critical care management consulted.  Cardiology consultation obtained.  A left IJ temporary dialysis catheter was placed and seen RT initiated.  Concern for aspiration pneumonia, antibiotics started.  The echo revealed ejection fraction of 35 to 40%.  On 1/22, worsening thrombocytopenia noted and hematology consulted.  Platelet infusion given.  He was noted to be in atrial fibrillation with rapid ventricular response on 1/23.  On 1/25, palliative care consultation obtained.  Patient was extubated on 1/26.  Temporary dialysis catheter malfunctioned and a right femoral temporary catheter was placed on 1/28.  CRRT was discontinued and intermittent hemodialysis started on 1/30.  A vascular consultation was obtained and a right IJ tunneled  dialysis catheter was placed by Dr. Lucky Cowboy on 2/6.  Patient was noted to have developed a stage II sacral and heel pressure injuries.  [name] was started for low ejection fraction on 2/7.  Speech therapy evaluation carried out and the patient's diet was slowly advanced from dysphagia 1 to regular renal diet/carb modified with fluid restriction of 1200 mL.   Gastroenterology consultation obtained on 2/7 secondary to ongoing nonbloody diarrhea with stool lactoferrin positivity.  He underwent colonoscopy on 2/8 with poor prep.  Biopsies of normal mucosa of the entire colon were obtained.  Patient will need to follow-up as outpatient for pathology report and further recommendations.   Pt reports pain was 6-6.5/10- but better now that's gotten pain meds.  Likes fentanyl better than oxycodone. - better pain control.  LBM last night Peeing ~2x/day-    Review of Systems  Constitutional:  Positive for chills and fever.  Eyes:  Positive for blurred vision.       Since being in ICU. Does not wear glasses or contacts  Respiratory:  Negative for cough and shortness of breath.   Cardiovascular:  Negative for chest pain.  Gastrointestinal:        Last BM last night. (On scheduled loperamide for diarrhea)  Musculoskeletal:  Positive for back pain.       Pain over sacrum  Neurological:  Positive for dizziness. Negative for headaches.       A bit light headed when standing  All other systems reviewed and are negative.     Past Medical History:  Diagnosis Date   Cannabinoid hyperemesis syndrome     CKD (chronic kidney disease), stage IV ([place])  Diabetes 1.5, managed as type 1 ([place])     DKA (diabetic ketoacidoses) 05/21/2016   Gastroparesis     HTN (hypertension)     Nicotine dependence     Perirectal abscess 06/16/2017   Right arm cellulitis 01/26/2018         Past Surgical History:  Procedure Laterality Date   DIALYSIS/PERMA CATHETER INSERTION N/A 06/15/2021    Procedure: DIALYSIS/PERMA CATHETER  INSERTION;  Surgeon: Algernon Huxley, MD;  Location: [facility];  Service: Cardiovascular;  Laterality: N/A;   DIALYSIS/PERMA CATHETER INSERTION N/A 06/19/2021    Procedure: DIALYSIS/PERMA CATHETER INSERTION;  Surgeon: Algernon Huxley, MD;  Location: [place] CV LAB;  Service: Cardiovascular;  Laterality: N/A;   INCISION AND DRAINAGE PERIRECTAL ABSCESS N/A 06/16/2017    Procedure: IRRIGATION AND DEBRIDEMENT PERIRECTAL ABSCESS;  Surgeon: Florene Glen, MD;  Location: [facility] ORS;  Service: General;  Laterality: N/A;   none             Family History  Problem Relation Age of Onset   Diabetes Mother      Social History:  reports that he has been smoking cigarettes. He has been smoking an average of .5 packs per day. He has never used smokeless tobacco. He reports current drug use. Drug: Marijuana. He reports that he does not drink alcohol. Allergies: No Known Allergies       Medications Prior to Admission  Medication Sig Dispense Refill   amLODipine (NORVASC) 10 MG tablet Take 1 tablet (10 mg total) by mouth daily. (Patient not taking: Reported on 06/01/2021) 10 tablet 0   hydrALAZINE (APRESOLINE) 50 MG tablet Take 1 tablet (50 mg total) by mouth 2 (two) times daily. (Patient not taking: Reported on 06/01/2021) 60 tablet 2   insulin glargine (LANTUS) 100 UNIT/ML injection Inject 20 Units into the skin at bedtime. (Patient not taking: Reported on 06/01/2021)       omeprazole (PRILOSEC) 40 MG capsule Take 1 capsule (40 mg total) by mouth daily. 30 capsule 1          Home: Home Living Family/patient expects to be discharged to:: Private residence Living Arrangements: Other relatives (2 aunts) Available Help at Discharge: Family, Available PRN/intermittently Type of Home: House Home Access: Ramped entrance Entrance Stairs-Number of Steps: 6 Home Layout: One level Bathroom Shower/Tub: Tub only, Chiropodist: Handicapped height Bathroom Accessibility: Yes  Lives  With: Family   Functional History: Prior Function Prior Level of Function : Independent/Modified Independent, Driving, Working/employed   Functional Status:  Mobility: Bed Mobility Overal bed mobility: Needs Assistance Bed Mobility: Supine to Sit Rolling: Min guard Supine to sit: Min assist Sit to supine: Mod assist General bed mobility comments: assist for BLE and trunk control Transfers Overall transfer level: Needs assistance Equipment used: None Transfers: Bed to chair/wheelchair/BSC Bed to/from chair/wheelchair/BSC transfer type:: Stand pivot Stand pivot transfers: Min assist General transfer comment: Min A and mod verbal cues for sequencing from elevated EOB Ambulation/Gait General Gait Details: N/A, contraindicated   ADL: ADL Overall ADL's : Needs assistance/impaired General ADL Comments: MAX A don/doff B socks at bed level. MOD A bathing at bed level - pt completes UB with cues for thoroughness. MAX A x2 toileting at bed level - pt continues to be incontinent of stool - RN in to place rectal tube. MIN A don/doff gown at bed level. MOD A x2 for ADL t/f   Cognition: Cognition Overall Cognitive Status: Within Functional Limits for tasks assessed Orientation  Level: Oriented X4 Cognition Arousal/Alertness: Awake/alert Behavior During Therapy: Flat affect Overall Cognitive Status: Within Functional Limits for tasks assessed General Comments: Pt is oriented x4, appropriate, but slow to respond   Physical Exam: Blood pressure 131/82, pulse 85, temperature 98.1 F (36.7 C), resp. rate 16, height 5' 7.99" (1.727 m), weight 70.3 kg, SpO2 100 %. Physical Exam Vitals and nursing note reviewed. Exam conducted with a chaperone present.  Constitutional:      Comments: Not engaged/ poor interaction; laying in bed; young patient- nurse at bedside; NAD  HENT:     Head: Normocephalic and atraumatic.     Right Ear: External ear normal.     Left Ear: External ear normal.      Nose: Nose normal. No congestion.     Mouth/Throat:     Mouth: Mucous membranes are dry.     Pharynx: Oropharynx is clear. No oropharyngeal exudate.  Eyes:     General:        Right eye: No discharge.        Left eye: No discharge.     Extraocular Movements: Extraocular movements intact.     Comments: Injected sclera B/L   Cardiovascular:     Rate and Rhythm: Normal rate and regular rhythm.     Heart sounds: Normal heart sounds. No murmur heard.   No gallop.  Pulmonary:     Effort: Pulmonary effort is normal. No respiratory distress.     Breath sounds: Normal breath sounds. No wheezing or rales.  Abdominal:     General: Abdomen is flat. Bowel sounds are normal. There is no distension.     Palpations: Abdomen is soft.     Tenderness: There is no abdominal tenderness.     Comments: Flank edema/skin doughy  Musculoskeletal:     Cervical back: Neck supple. No tenderness.     Comments: Ues 5-/5 B/L  LE's 4-/5 in HF, KE, KF DF and PF B/L    Skin:    General: Skin is warm and dry.     Comments: Very dry, scaly skin- esp on feet Stage II B/L buttocks with associated abraded skin L lateral foot- has unstageable with thick creamy eschar? Vs slough in wound bed; L medial foot- abraded/erythematous  Neurological:     General: No focal deficit present.     Mental Status: He is alert and oriented to person, place, and time.     Comments: Poor interaction with interviewer- - anoxia vs just disengaged Stokcing pattern absent sensation to light touch from knees down B/L   Psychiatric:     Comments: Extremely flat- almost masked facies          Lab Results Last 48 Hours        Results for orders placed or performed during the hospital encounter of 05/31/21 (from the past 48 hour(s))  Glucose, capillary     Status: Abnormal    Collection Time: 06/18/21 12:25 PM  Result Value Ref Range    Glucose-Capillary 258 (H) 70 - 99 mg/dL      Comment: Glucose reference range applies only to  samples taken after fasting for at least 8 hours.  Glucose, capillary     Status: Abnormal    Collection Time: 06/18/21  4:46 PM  Result Value Ref Range    Glucose-Capillary 154 (H) 70 - 99 mg/dL      Comment: Glucose reference range applies only to samples taken after fasting for at least 8 hours.  Glucose, capillary  Status: Abnormal    Collection Time: 06/18/21  9:05 PM  Result Value Ref Range    Glucose-Capillary 172 (H) 70 - 99 mg/dL      Comment: Glucose reference range applies only to samples taken after fasting for at least 8 hours.  Glucose, capillary     Status: Abnormal    Collection Time: 06/19/21  7:53 AM  Result Value Ref Range    Glucose-Capillary 103 (H) 70 - 99 mg/dL      Comment: Glucose reference range applies only to samples taken after fasting for at least 8 hours.    Comment 1 Notify RN    Renal function panel     Status: Abnormal    Collection Time: 06/19/21  9:05 AM  Result Value Ref Range    Sodium 138 135 - 145 mmol/L    Potassium 4.8 3.5 - 5.1 mmol/L    Chloride 106 98 - 111 mmol/L    CO2 24 22 - 32 mmol/L    Glucose, Bld 98 70 - 99 mg/dL      Comment: Glucose reference range applies only to samples taken after fasting for at least 8 hours.    BUN 50 (H) 6 - 20 mg/dL    Creatinine, Ser 5.03 (H) 0.61 - 1.24 mg/dL    Calcium 6.7 (L) 8.9 - 10.3 mg/dL    Phosphorus 5.2 (H) 2.5 - 4.6 mg/dL    Albumin 1.8 (L) 3.5 - 5.0 g/dL    GFR, Estimated 15 (L) >60 mL/min      Comment: (NOTE) Calculated using the CKD-EPI Creatinine Equation (2021)      Anion gap 8 5 - 15      Comment: Performed at [facility], [address] [place], [place] [address]  CBC     Status: Abnormal    Collection Time: 06/19/21  9:05 AM  Result Value Ref Range    WBC 9.2 4.0 - 10.5 K/uL    RBC 2.70 (L) 4.22 - 5.81 MIL/uL    Hemoglobin 7.9 (L) 13.0 - 17.0 g/dL    HCT 25.0 (L) 39.0 - 52.0 %    MCV 92.6 80.0 - 100.0 fL    MCH 29.3 26.0 - 34.0 pg    MCHC 31.6 30.0 - 36.0  g/dL    RDW 15.1 11.5 - 15.5 %    Platelets 70 (L) 150 - 400 K/uL      Comment: Immature Platelet Fraction may be clinically indicated, consider ordering this additional test [id number] PLATELET COUNT CONFIRMED BY SMEAR      nRBC 0.0 0.0 - 0.2 %      Comment: Performed at [facility], [address] [place], [place] [address]  Folate     Status: None    Collection Time: 06/19/21  9:05 AM  Result Value Ref Range    Folate 7.9 >5.9 ng/mL      Comment: Performed at [facility], [address] [place], [place] [address]  Iron and TIBC     Status: Abnormal    Collection Time: 06/19/21  9:05 AM  Result Value Ref Range    Iron 80 45 - 182 ug/dL    TIBC 167 (L) 250 - 450 ug/dL    Saturation Ratios 48 (H) 17.9 - 39.5 %    UIBC 87 ug/dL      Comment: Performed at [facility], [address], [place], [place] [address]  Ferritin     Status: Abnormal  Collection Time: 06/19/21  9:05 AM  Result Value Ref Range    Ferritin 478 (H) 24 - 336 ng/mL      Comment: Performed at [facility], [address] [place], [place] [address]  Reticulocytes     Status: Abnormal    Collection Time: 06/19/21  9:05 AM  Result Value Ref Range    Retic Ct Pct 1.7 0.4 - 3.1 %    RBC. 2.67 (L) 4.22 - 5.81 MIL/uL    Retic Count, Absolute 46.5 19.0 - 186.0 K/uL    Immature Retic Fract 3.8 2.3 - 15.9 %      Comment: Performed at [facility], [address] [place], [place] [address]  Glucose, capillary     Status: None    Collection Time: 06/19/21 10:09 AM  Result Value Ref Range    Glucose-Capillary 87 70 - 99 mg/dL      Comment: Glucose reference range applies only to samples taken after fasting for at least 8 hours.  Glucose, capillary     Status: None    Collection Time: 06/19/21 11:39 AM  Result Value Ref Range    Glucose-Capillary 70 70 - 99 mg/dL      Comment: Glucose reference range applies only to samples taken after fasting for at least 8  hours.  Glucose, capillary     Status: Abnormal    Collection Time: 06/19/21 12:07 PM  Result Value Ref Range    Glucose-Capillary 69 (L) 70 - 99 mg/dL      Comment: Glucose reference range applies only to samples taken after fasting for at least 8 hours.  Glucose, capillary     Status: Abnormal    Collection Time: 06/19/21 12:36 PM  Result Value Ref Range    Glucose-Capillary 107 (H) 70 - 99 mg/dL      Comment: Glucose reference range applies only to samples taken after fasting for at least 8 hours.  Glucose, capillary     Status: Abnormal    Collection Time: 06/19/21 12:53 PM  Result Value Ref Range    Glucose-Capillary 126 (H) 70 - 99 mg/dL      Comment: Glucose reference range applies only to samples taken after fasting for at least 8 hours.  Glucose, capillary     Status: Abnormal    Collection Time: 06/19/21  6:42 PM  Result Value Ref Range    Glucose-Capillary 144 (H) 70 - 99 mg/dL      Comment: Glucose reference range applies only to samples taken after fasting for at least 8 hours.    Comment 1 Notify RN    Glucose, capillary     Status: Abnormal    Collection Time: 06/19/21  9:24 PM  Result Value Ref Range    Glucose-Capillary 108 (H) 70 - 99 mg/dL      Comment: Glucose reference range applies only to samples taken after fasting for at least 8 hours.  Hepatic function panel     Status: Abnormal    Collection Time: 06/20/21  4:37 AM  Result Value Ref Range    Total Protein 4.9 (L) 6.5 - 8.1 g/dL    Albumin 1.6 (L) 3.5 - 5.0 g/dL    AST 14 (L) 15 - 41 U/L    ALT 5 0 - 44 U/L    Alkaline Phosphatase 133 (H) 38 - 126 U/L    Total Bilirubin 0.9 0.3 - 1.2 mg/dL    Bilirubin, Direct 0.3 (H) 0.0 - 0.2 mg/dL  Indirect Bilirubin 0.6 0.3 - 0.9 mg/dL      Comment: Performed at [facility], [address] [place], [place] [address]  Renal function panel     Status: Abnormal    Collection Time: 06/20/21  4:37 AM  Result Value Ref Range    Sodium 136 135 - 145  mmol/L    Potassium 4.5 3.5 - 5.1 mmol/L    Chloride 104 98 - 111 mmol/L    CO2 25 22 - 32 mmol/L    Glucose, Bld 90 70 - 99 mg/dL      Comment: Glucose reference range applies only to samples taken after fasting for at least 8 hours.    BUN 30 (H) 6 - 20 mg/dL    Creatinine, Ser 3.50 (H) 0.61 - 1.24 mg/dL    Calcium 6.9 (L) 8.9 - 10.3 mg/dL    Phosphorus 4.4 2.5 - 4.6 mg/dL    Albumin 1.6 (L) 3.5 - 5.0 g/dL    GFR, Estimated 24 (L) >60 mL/min      Comment: (NOTE) Calculated using the CKD-EPI Creatinine Equation (2021)      Anion gap 7 5 - 15      Comment: Performed at [facility], [address] [place], [place] [address]  CBC     Status: Abnormal    Collection Time: 06/20/21  4:37 AM  Result Value Ref Range    WBC 10.1 4.0 - 10.5 K/uL    RBC 2.47 (L) 4.22 - 5.81 MIL/uL    Hemoglobin 7.4 (L) 13.0 - 17.0 g/dL    HCT 23.0 (L) 39.0 - 52.0 %    MCV 93.1 80.0 - 100.0 fL    MCH 30.0 26.0 - 34.0 pg    MCHC 32.2 30.0 - 36.0 g/dL    RDW 15.1 11.5 - 15.5 %    Platelets 70 (L) 150 - 400 K/uL      Comment: Immature Platelet Fraction may be clinically indicated, consider ordering this additional test [id number] REPEATED TO VERIFY      nRBC 0.0 0.0 - 0.2 %      Comment: Performed at [facility], [address] [place], [place] [address]  Lactoferrin, Fecal, Qualitative     Status: Abnormal    Collection Time: 06/20/21  4:55 AM  Result Value Ref Range    Lactoferrin, Fecal, Qual POSITIVE (A) NEGATIVE      Comment: Performed at [facility], [address] [place], [place] [address]  Glucose, capillary     Status: Abnormal    Collection Time: 06/20/21  7:13 AM  Result Value Ref Range    Glucose-Capillary 52 (L) 70 - 99 mg/dL      Comment: Glucose reference range applies only to samples taken after fasting for at least 8 hours.  Glucose, capillary     Status: Abnormal    Collection Time: 06/20/21  8:41 AM  Result Value Ref Range    Glucose-Capillary  62 (L) 70 - 99 mg/dL      Comment: Glucose reference range applies only to samples taken after fasting for at least 8 hours.  Glucose, capillary     Status: Abnormal    Collection Time: 06/20/21 10:40 AM  Result Value Ref Range    Glucose-Capillary 112 (H) 70 - 99 mg/dL      Comment: Glucose reference range applies only to samples taken after fasting for at least 8 hours.       Imaging Results (Last 48  hours)  PERIPHERAL VASCULAR CATHETERIZATION   Result Date: 06/19/2021 See surgical note for result.          Blood pressure 131/82, pulse 85, temperature 98.1 F (36.7 C), resp. rate 16, height 5' 7.99" (1.727 m), weight 70.3 kg, SpO2 100 %.   Medical Problem List and Plan: 1. Functional deficits secondary to debility from  renal failure, heart failure, PEA arrest- possible anoxia?             -patient may not shower due to HD catheter             -ELOS/Goals: 12-14 days- supervision 2.  Antithrombotics: -DVT/anticoagulation:  Pharmaceutical: Lovenox             -antiplatelet therapy: none 3. Pain Management: Tylenol, oxycodone- pt reports pain "better controlled with fentanyl"- not sure when he received this and if a chance is needed? 4. Mood: LCSW to evaluate and provide emotional support             -antipsychotic agents: n/a 5. Neuropsych: This patient is capable of making decisions on his own behalf. 6. Skin/Wound Care: Sacral and Left heel unstageable/ decubitus ulcer wound care: [place] nurse consult- already had Santyl             --Eucerin as needed for dry skin 7. Fluids/Electrolytes/Nutrition: Fluid restriction 1200 mL daily             --per nephrology             --renal/carb modified diet. Continue supplements 8: ESRD on iHD via right IJ TDC on MWF schedule 9: DM 1: Continue Semglee 5 units at bedtime, SSI meal/HS coverage; CBGs- no oral agents due to type 1 DM 10: Heart failure: Continue Entresto BID, Coreg 11: Atrial fibrillation: Continue amiodarone BID; ? need for  AC 12: Hypertension: continue Coreg 13: Anemia, chronic disease: Is getting iron supplementation 14: Tobacco use: provide cessation counseling             --? Nicotine patch 15: GERD: Continue PPI 16: Thrombocytopenia: Continue to monitor platelet count             --No signs of bleeding 17: Aspiration pneumonia: treated and resolved 18: Anoxic brain injury s/p PEA arrest: neuro intact but extremely flat/disengaged 19: Diarrhea: + stool lactoferrin; C.diff was negative                 --colonoscopy with biopsies 2/8             --continued scheduled loperamide for now and continue to monitor BMs         I have personally performed a face to face diagnostic evaluation of this patient and formulated the key components of the plan.  Additionally, I have personally reviewed laboratory data, imaging studies, as well as relevant notes and concur with the physician assistant's documentation above.   The patient's status has not changed from the original H&P.  Any changes in documentation from the acute care chart have been noted above.       Barbie Banner, PA-C 06/20/2021

## 2021-06-22 NOTE — Progress Notes (Signed)
Patient ID: Jonathan Mcmillan, male   DOB: May 30, 1995, 26 y.o.   MRN: [id number] Met with the patient to review role, rehab process, team conference and plan of care. Discussed secondary risks including HF, HTN, ESRD- HD,  and DM (A1 C 5.7) . Reviewed renal/CMM dietary modifications and skin care. Continue to follow along to discharge to address educational needs and facilitate preparation for discharge. Margarito Liner

## 2021-06-22 NOTE — Progress Notes (Signed)
Report called to [place] at [facility]

## 2021-06-22 NOTE — Progress Notes (Signed)
IP rehab admissions - I have scheduled transport with [facility] for about 1 pm and Dr. Dagoberto Ligas is accepting MD and room will be 4 [place] 05.  I did call Tammy, his aunt, about admit to [place] today.  Call me for questions.  [phone]

## 2021-06-22 NOTE — Progress Notes (Signed)
Inpatient Rehabilitation Admission Medication Review by a Pharmacist  A complete drug regimen review was completed for this patient to identify any potential clinically significant medication issues.  High Risk Drug Classes Is patient taking? Indication by Medication  Antipsychotic Yes Compazine for N/V  Anticoagulant Yes Lovenox for VTE prophx  Antibiotic No   Opioid Yes Oxy for Sacral and right heel decubitus ulcer   Antiplatelet No   Hypoglycemics/insulin Yes SSI for Type 1 DM  Vasoactive Medication Yes Coreg, Entresto for afib and low EF  Chemotherapy No   Other No      Clinically significant medication issues were identified that warrant physician communication and completion of prescribed/recommended actions by midnight of the next day:  No  Time spent performing this drug regimen review (minutes):  15 min  Terecia Plaut S. Alford Highland, PharmD, BCPS Clinical Staff Pharmacist [email or web address] [place] 06/22/2021 2:32 PM

## 2021-06-22 NOTE — Progress Notes (Signed)
Pt has critical hgb of 6.6. Dan PA caled and they are aware. Renal will be following up. No orders given.

## 2021-06-22 NOTE — Progress Notes (Signed)
SUBJECTIVE: Jonathan Mcmillan is a 26 y.o. male with medical history including diabetes, CKD, HTN, nicotine dependence admitted on 05/31/2021 with AKI on CKD, anemia, and fevers/chills/myalgias . Patient suffered cardiac arrest on the morning of 1/19 and was ultimately intubated and transferred to the ICU. Patient extubated 06/08/21. Patient transferred to step down unit on 06/13/21.  Patient denies chest pain, shortness of breath.    Vitals:   06/21/21 1941 06/22/21 0500 06/22/21 0515 06/22/21 0831  BP: 110/68  120/79 (!) 128/91  Pulse: 92  89 88  Resp: 18  20 18   Temp: 97.8 F (36.6 C)  98.5 F (36.9 C) 98.5 F (36.9 C)  TempSrc: Oral  Oral   SpO2: 100%  96% 100%  Weight:  69.4 kg    Height:        Intake/Output Summary (Last 24 hours) at 06/22/2021 0835 Last data filed at 06/21/2021 1853 Gross per 24 hour  Intake 120 ml  Output 501 ml  Net -381 ml    LABS: Basic Metabolic Panel: Recent Labs    06/19/21 0905 06/20/21 0437  NA 138 136  K 4.8 4.5  CL 106 104  CO2 24 25  GLUCOSE 98 90  BUN 50* 30*  CREATININE 5.03* 3.50*  CALCIUM 6.7* 6.9*  PHOS 5.2* 4.4   Liver Function Tests: Recent Labs    06/19/21 0905 06/20/21 0437  AST  --  14*  ALT  --  5  ALKPHOS  --  133*  BILITOT  --  0.9  PROT  --  4.9*  ALBUMIN 1.8* 1.6*   1.6*   No results for input(s): LIPASE, AMYLASE in the last 72 hours. CBC: Recent Labs    06/19/21 0905 06/20/21 0437  WBC 9.2 10.1  HGB 7.9* 7.4*  HCT 25.0* 23.0*  MCV 92.6 93.1  PLT 70* 70*   Cardiac Enzymes: No results for input(s): CKTOTAL, CKMB, CKMBINDEX, TROPONINI in the last 72 hours. BNP: Invalid input(s): POCBNP D-Dimer: No results for input(s): DDIMER in the last 72 hours. Hemoglobin A1C: No results for input(s): HGBA1C in the last 72 hours. Fasting Lipid Panel: No results for input(s): CHOL, HDL, LDLCALC, TRIG, CHOLHDL, LDLDIRECT in the last 72 hours. Thyroid Function Tests: No results for input(s): TSH, T4TOTAL, T3FREE,  THYROIDAB in the last 72 hours.  Invalid input(s): FREET3 Anemia Panel: Recent Labs    06/19/21 0905 06/20/21 0437  VITAMINB12  --  2,065*  FOLATE 7.9  --   FERRITIN 478*  --   TIBC 167*  --   IRON 80  --   RETICCTPCT 1.7  --      PHYSICAL EXAM General: Well developed, well nourished, in no acute distress HEENT:  Normocephalic and atramatic Neck:  No JVD.  Lungs: Clear bilaterally to auscultation and percussion. Heart: HRRR . Normal S1 and S2 without gallops or murmurs.  Abdomen: Bowel sounds are positive, abdomen soft and non-tender  Msk:  Back normal, normal gait. Normal strength and tone for age. Extremities: No clubbing, cyanosis or edema.   Neuro: Alert and oriented X 3. Psych:  Good affect, responds appropriately  TELEMETRY: normal sinus rhythm, HR 88 bpm  ASSESSMENT AND PLAN: Status post atrial fibrillation with rapid ventricular response rate currently on p.o. amiodarone 200 twice daily advised changing to 200 mg daily.  Status post cardiac arrest due to asystole now extubated and much more alert and oriented.   Echo 06/05/21 revealed EF 35-40%. Currently on carvedilol. B/p remaining elevated. Patient currently dialyzing  three days a week. Entresto added for low EF. Patient expecting to be discharged to rehab facility today. Patient to follow up in office in one week.   Active Problems:   Type 1 diabetes mellitus with complication (HCC)   Thrombocytopenia (HCC)   ESRD (end stage renal disease) (HCC)   Paroxysmal atrial fibrillation (HCC)   Chronic combined systolic and diastolic CHF (congestive heart failure) (HCC)   Pressure injury of buttock, stage 2 (HCC)   Complication associated with dialysis catheter   Aspiration pneumonia ([place])    Jonathan Flannagan, FNP-C 06/22/2021 8:35 AM

## 2021-06-23 DIAGNOSIS — G931 Anoxic brain damage, not elsewhere classified: Secondary | ICD-10-CM

## 2021-06-23 DIAGNOSIS — S31000S Unspecified open wound of lower back and pelvis without penetration into retroperitoneum, sequela: Secondary | ICD-10-CM

## 2021-06-23 DIAGNOSIS — E108 Type 1 diabetes mellitus with unspecified complications: Secondary | ICD-10-CM

## 2021-06-23 DIAGNOSIS — R5381 Other malaise: Principal | ICD-10-CM

## 2021-06-23 LAB — RENAL FUNCTION PANEL
Albumin: <1.5 g/dL — ABNORMAL LOW (ref 3.5–5.0)
Anion gap: 9 (ref 5–15)
BUN: 27 mg/dL — ABNORMAL HIGH (ref 6–20)
CO2: 25 mmol/L (ref 22–32)
Calcium: 6.2 mg/dL — CL (ref 8.9–10.3)
Chloride: 102 mmol/L (ref 98–111)
Creatinine, Ser: 4.37 mg/dL — ABNORMAL HIGH (ref 0.61–1.24)
GFR, Estimated: 18 mL/min — ABNORMAL LOW (ref >60)
Glucose, Bld: 179 mg/dL — ABNORMAL HIGH (ref 70–99)
Phosphorus: 6.2 mg/dL — ABNORMAL HIGH (ref 2.5–4.6)
Potassium: 5 mmol/L (ref 3.5–5.1)
Sodium: 136 mmol/L (ref 135–145)

## 2021-06-23 LAB — CBC
HCT: 22.8 % — ABNORMAL LOW (ref 39.0–52.0)
HCT: 21.1 % — ABNORMAL LOW (ref 39.0–52.0)
Hemoglobin: 7.3 g/dL — ABNORMAL LOW (ref 13.0–17.0)
Hemoglobin: 6.7 g/dL — CL (ref 13.0–17.0)
MCH: 30 pg (ref 26.0–34.0)
MCH: 29.9 pg (ref 26.0–34.0)
MCHC: 32 g/dL (ref 30.0–36.0)
MCHC: 31.8 g/dL (ref 30.0–36.0)
MCV: 93.8 fL (ref 80.0–100.0)
MCV: 94.2 fL (ref 80.0–100.0)
Platelets: 62 10*3/uL — ABNORMAL LOW (ref 150–400)
Platelets: 56 10*3/uL — ABNORMAL LOW (ref 150–400)
RBC: 2.43 MIL/uL — ABNORMAL LOW (ref 4.22–5.81)
RBC: 2.24 MIL/uL — ABNORMAL LOW (ref 4.22–5.81)
RDW: 14.8 % (ref 11.5–15.5)
RDW: 14.7 % (ref 11.5–15.5)
WBC: 10.9 10*3/uL — ABNORMAL HIGH (ref 4.0–10.5)
WBC: 12.1 10*3/uL — ABNORMAL HIGH (ref 4.0–10.5)
nRBC: 0 % (ref 0.0–0.2)
nRBC: 0 % (ref 0.0–0.2)

## 2021-06-23 LAB — PREPARE RBC (CROSSMATCH)

## 2021-06-23 LAB — GLUCOSE, CAPILLARY
Glucose-Capillary: 103 mg/dL — ABNORMAL HIGH (ref 70–99)
Glucose-Capillary: 188 mg/dL — ABNORMAL HIGH (ref 70–99)

## 2021-06-23 LAB — PANCREATIC ELASTASE, FECAL: Pancreatic Elastase-1, Stool: 213 ug Elast./g (ref >200)

## 2021-06-23 MED ORDER — HYDROCERIN EX CREA
TOPICAL_CREAM | Freq: Every day | CUTANEOUS | Status: DC
  Filled 2021-06-23: qty 113

## 2021-06-23 MED ORDER — HEPARIN SODIUM (PORCINE) 1000 UNIT/ML IJ SOLN
INTRAMUSCULAR | Status: AC
  Administered 2021-06-23: 1000 [IU]
  Filled 2021-06-23: qty 4

## 2021-06-23 MED ORDER — SODIUM CHLORIDE 0.9% IV SOLUTION: Freq: Once | INTRAVENOUS | Status: DC

## 2021-06-23 NOTE — Progress Notes (Signed)
Inpatient Rehabilitation Care Coordinator Assessment and Plan Patient Details  Name: Jonathan Mcmillan MRN: [id number] Date of Birth: [birth year]  Today's Date: 06/23/2021  Hospital Problems: Principal Problem:   ESRD on dialysis [facility]) Active Problems:   Type 1 diabetes mellitus with complication ([place])   Debility  Past Medical History:  Past Medical History:  Diagnosis Date   Cannabinoid hyperemesis syndrome    CKD (chronic kidney disease), stage IV ([place])    Diabetes 1.5, managed as type 1 ([place])    DKA (diabetic ketoacidoses) 05/21/2016   Gastroparesis    HTN (hypertension)    Nicotine dependence    Perirectal abscess 06/16/2017   Right arm cellulitis 01/26/2018   Past Surgical History:  Past Surgical History:  Procedure Laterality Date   COLONOSCOPY WITH PROPOFOL N/A 06/21/2021   Procedure: COLONOSCOPY WITH PROPOFOL;  Surgeon: Lin Landsman, MD;  Location: [place];  Service: Gastroenterology;  Laterality: N/A;   DIALYSIS/PERMA CATHETER INSERTION N/A 06/15/2021   Procedure: DIALYSIS/PERMA CATHETER INSERTION;  Surgeon: Algernon Huxley, MD;  Location: [place] CV LAB;  Service: Cardiovascular;  Laterality: N/A;   DIALYSIS/PERMA CATHETER INSERTION N/A 06/19/2021   Procedure: DIALYSIS/PERMA CATHETER INSERTION;  Surgeon: Algernon Huxley, MD;  Location: [place] CV LAB;  Service: Cardiovascular;  Laterality: N/A;   INCISION AND DRAINAGE PERIRECTAL ABSCESS N/A 06/16/2017   Procedure: IRRIGATION AND DEBRIDEMENT PERIRECTAL ABSCESS;  Surgeon: Florene Glen, MD;  Location: [facility] ORS;  Service: General;  Laterality: N/A;   none     Social History:  reports that he has been smoking cigarettes. He has been smoking an average of .5 packs per day. He has never used smokeless tobacco. He reports current drug use. Drug: Marijuana. He reports that he does not drink alcohol.  Family / Support Systems Marital Status: Single Patient Roles: Other (Comment) (nephew) Other Supports:  Tammy-Aunt [phone]  [phone]  Mary-grandmother [phone]  Ileene Rubens [phone] Anticipated Caregiver: Family members Ability/Limitations of Caregiver: Tammy works during the day but other family members will be there if he needs assist Caregiver Availability: 24/7 Family Dynamics: Close knit family who will rally around pt and provide the care he needs. He is wanting to get independent before he leaves here. He also has friends who he can count on  Social History Preferred language: English Religion: None Cultural Background: No issues Education: [place] - How often do you need to have someone help you when you read instructions, pamphlets, or other written material from your doctor or pharmacy?: Never Writes: Yes Employment Status: Employed Return to Work Plans: Does not plan to now Public relations account executive Issues: No issues Guardian/Conservator: none-according to MD pt is capable of making his own decisions while here   Abuse/Neglect Abuse/Neglect Assessment Can Be Completed: Yes Physical Abuse: Denies Verbal Abuse: Denies Sexual Abuse: Denies Exploitation of patient/patient's resources: Denies Self-Neglect: Denies  Patient response to: Social Isolation - How often do you feel lonely or isolated from those around you?: Rarely  Emotional Status Pt's affect, behavior and adjustment status: Pt is motivated to do well here, he is bothered by his vision issues and feels like their is a film over his eyes. He is just trying to deal with all of this and it is too much all at once. Recent Psychosocial Issues: other health issues Psychiatric History: No history feel with all that has happened to pt he would benefit from seeing neuro-psych while here. Will place on list to be seen Substance Abuse History: Tobacco and  THC feels helped him cope but can quit if needed  Patient / Family Perceptions, Expectations & Goals Pt/Family understanding of illness & functional  limitations: Pt can explain his medical issues-diabetes, now renal issues and requiring dialysis, treating for pneumonia. Pt talks with the MD and feels understands what is going on with his treatment plan and plans going forward. He does like to be kept updated. Premorbid pt/family roles/activities: nephew, cousin, grandson, friend, etc Anticipated changes in roles/activities/participation: resume Pt/family expectations/goals: Pt states: " I want to be able to do for myself if I can I always have been."  [employer]: None Premorbid Home Care/DME Agencies: None Transportation available at discharge: Family members pt drive PTA Is the patient able to respond to transportation needs?: Yes In the past 12 months, has lack of transportation kept you from medical appointments or from getting medications?: No In the past 12 months, has lack of transportation kept you from meetings, work, or from getting things needed for daily living?: No Resource referrals recommended: Neuropsychology  Discharge Planning Living Arrangements: Other relatives Support Systems: Other relatives, Friends/neighbors Type of Residence: Private residence Insurance Resources: Government social research officer (Applying for [employer]) Financial Resources: Employment, Secondary school teacher Screen Referred: Yes Living Expenses: Lives with family Money Management: Family Does the patient have any problems obtaining your medications?: Yes (Describe) (uninsured) Home Management: Family members Patient/Family Preliminary Plans: Return home with Aunt and other family members to assist him. Pt hopes to be mod/i by discharge and be able to take care of himself. Aware team evaluating him and setting goals for stay Care Coordinator Barriers to Discharge: Insurance for SNF coverage, Hemodialysis Care Coordinator Anticipated Follow Up Needs: HH/OP  Clinical Impression Pleasant gentleman who is motivated to do well and recover  from his health scare. He is concerned about his vision and aware will need to go to an Optometrist as an OP. Supportive family-via Aunts and other family members. Feels no issues with transportation to OP-HD. Work on discharge needs-placed on neuro-psych list  Elease Hashimoto 06/23/2021, 1:37 PM

## 2021-06-23 NOTE — Progress Notes (Signed)
Inpatient Rehabilitation  Patient information reviewed and entered into eRehab system by Claudina Oliphant M. Pattrick Bady, M.A., CCC/SLP, PPS Coordinator.  Information including medical coding, functional ability and quality indicators will be reviewed and updated through discharge.    

## 2021-06-23 NOTE — Evaluation (Signed)
Occupational Therapy Assessment and Plan  Patient Details  Name: Jonathan Mcmillan MRN: [id number] Date of Birth: [birth year]  OT Diagnosis: muscular wasting and disuse atrophy and muscle weakness (generalized) Rehab Potential: Rehab Potential (ACUTE ONLY): Good ELOS: 14-16 days   Today's Date: 06/23/2021 OT Individual Time: 0930-1030 OT Individual Time Calculation (min): 60 min     Hospital Problem: Principal Problem:   ESRD on dialysis [facility]) Active Problems:   Type 1 diabetes mellitus with complication ([place])   Debility   Past Medical History:  Past Medical History:  Diagnosis Date   Cannabinoid hyperemesis syndrome    CKD (chronic kidney disease), stage IV ([place])    Diabetes 1.5, managed as type 1 ([place])    DKA (diabetic ketoacidoses) 05/21/2016   Gastroparesis    HTN (hypertension)    Nicotine dependence    Perirectal abscess 06/16/2017   Right arm cellulitis 01/26/2018   Past Surgical History:  Past Surgical History:  Procedure Laterality Date   COLONOSCOPY WITH PROPOFOL N/A 06/21/2021   Procedure: COLONOSCOPY WITH PROPOFOL;  Surgeon: Lin Landsman, MD;  Location: [facility] END[facility];  Service: Gastroenterology;  Laterality: N/A;   DIALYSIS/PERMA CATHETER INSERTION N/A 06/15/2021   Procedure: DIALYSIS/PERMA CATHETER INSERTION;  Surgeon: Algernon Huxley, MD;  Location: [place] CV LAB;  Service: Cardiovascular;  Laterality: N/A;   DIALYSIS/PERMA CATHETER INSERTION N/A 06/19/2021   Procedure: DIALYSIS/PERMA CATHETER INSERTION;  Surgeon: Algernon Huxley, MD;  Location: [place] CV LAB;  Service: Cardiovascular;  Laterality: N/A;   INCISION AND DRAINAGE PERIRECTAL ABSCESS N/A 06/16/2017   Procedure: IRRIGATION AND DEBRIDEMENT PERIRECTAL ABSCESS;  Surgeon: Florene Glen, MD;  Location: [facility];  Service: General;  Laterality: N/A;   none      Assessment & Plan Clinical Impression: Jonathan Mcmillan is a 26 year old male who presented to the [facility] emergency  department on 05/31/2021 with fever, chills and vomiting.  Initial work-up revealed profound anemia and significantly elevated serum creatinine above baseline level.  Respiratory viral panels including COVID and influenza were negative.  He was admitted for further evaluation and work-up.  Nephrology consultation obtained.   The patient's past medical history includes chronic kidney disease stage IV, hypertension, diabetes mellitus type 1, gastroesophageal reflux disease.  Primary care obtained at [facility].  Followed by Dr. Loney Loh nephrology last seen 08/01/2020.   On 06/01/2021, the patient collapsed while toileting and rapid response was called.  He lost his pulse and CODE BLUE was called.  Was initially asystolic but progressed to PEA.  ACLS protocol carried out and patient was intubated.  Regained pulse and transferred to the ICU.  Critical care management consulted.  Cardiology consultation obtained.  A left IJ temporary dialysis catheter was placed and seen RT initiated.  Concern for aspiration pneumonia, antibiotics started.  The echo revealed ejection fraction of 35 to 40%.  On 1/22, worsening thrombocytopenia noted and hematology consulted.  Platelet infusion given.  He was noted to be in atrial fibrillation with rapid ventricular response on 1/23.  On 1/25, palliative care consultation obtained.  Patient was extubated on 1/26.  Temporary dialysis catheter malfunctioned and a right femoral temporary catheter was placed on 1/28.  CRRT was discontinued and intermittent hemodialysis started on 1/30.  A vascular consultation was obtained and a right IJ tunneled dialysis catheter was placed by Dr. Lucky Cowboy on 2/6.  Patient was noted to have developed a stage II sacral and heel pressure injuries.  [name] was started for  low ejection fraction on 2/7.  Speech therapy evaluation carried out and the patient's diet was slowly advanced from dysphagia 1 to regular renal diet/carb modified  with fluid restriction of 1200 mL.   Gastroenterology consultation obtained on 2/7 secondary to ongoing nonbloody diarrhea with stool lactoferrin positivity.  He underwent colonoscopy on 2/8 with poor prep.  Biopsies of normal mucosa of the entire colon were obtained.  Patient will need to follow-up as outpatient for pathology report and further recommendations.  Patient transferred to CIR on 06/22/2021 .    Patient currently requires max with basic self-care skills secondary to muscle weakness, decreased cardiorespiratoy endurance, and decreased sitting balance, decreased standing balance, decreased postural control, and decreased balance strategies.  Prior to hospitalization, patient was independent and working.  Patient will benefit from skilled intervention to increase independence with basic self-care skills prior to discharge home with care partner.  Anticipate patient will require intermittent supervision and follow up home health.  OT - End of Session Activity Tolerance: Tolerates 10 - 20 min activity with multiple rests Endurance Deficit: Yes Endurance Deficit Description: decreased blood pressure with activity 99/60 OT Assessment Rehab Potential (ACUTE ONLY): Good OT Patient demonstrates impairments in the following area(s): Balance;Endurance;Motor OT Basic ADL's Functional Problem(s): Bathing;Dressing;Toileting OT Transfers Functional Problem(s): Toilet OT Additional Impairment(s): None OT Plan OT Intensity: Minimum of 1-2 x/day, 45 to 90 minutes OT Frequency: 5 out of 7 days OT Duration/Estimated Length of Stay: 14-16 days OT Treatment/Interventions: Teacher, English as a foreign language;Discharge planning;Functional mobility training;Pain management;Patient/family education;Self Care/advanced ADL retraining;Psychosocial support;Therapeutic Activities;Therapeutic Exercise;UE/LE Strength taining/ROM;UE/LE Coordination activities OT Self Feeding Anticipated  Outcome(s): independent OT Basic Self-Care Anticipated Outcome(s): set up UB self care, CGA LB self care OT Toileting Anticipated Outcome(s): CGA to min A OT Bathroom Transfers Anticipated Outcome(s): Min A to toilet OT Recommendation Patient destination: Home Follow Up Recommendations: Home health OT Equipment Recommended: 3 in 1 bedside comode   OT Evaluation Precautions/Restrictions  Precautions Precautions: Fall Restrictions Weight Bearing Restrictions: No    Vital Signs Therapy Vitals Temp: (!) 97.4 F (36.3 C) Temp Source: Oral Pulse Rate: 72 Resp: 16 BP: 125/83 Patient Position (if appropriate): Lying Oxygen Therapy SpO2: 100 % O2 Device: Room Air Pain Pain Assessment Pain Score: 0-No pain Home Living/Prior Functioning Home Living Family/patient expects to be discharged to:: Private residence Living Arrangements: Other relatives Available Help at Discharge: Family, Available PRN/intermittently Type of Home: House Home Access: Ramped entrance Entrance Stairs-Number of Steps: 6 Home Layout: One level Bathroom Shower/Tub: Tub only, Chiropodist: Handicapped height Bathroom Accessibility: Yes  Lives With: Family Prior Function Level of Independence: Independent with basic ADLs, Independent with gait, Independent with transfers  Able to Take Stairs?: Yes Driving: Yes Vocation: Full time employment Vocation Requirements: worked in [employer] Baseline Vision/History:  (pt has been having dry eyes which impedes his ability to look at his phone) Ability to See in Adequate Light: 0 Adequate Patient Visual Report: No change from baseline Vision Assessment?: No apparent visual deficits Perception  Perception: Within Functional Limits Praxis Praxis: Intact Cognition Overall Cognitive Status: Within Functional Limits for tasks assessed Arousal/Alertness: Awake/alert Orientation Level: Person;Place;Situation Person: Oriented Place:  Oriented Situation: Oriented Year: 2023 Month: February Day of Week: Correct Memory: Appears intact Immediate Memory Recall: Sock;Blue;Bed Memory Recall Sock: Without Cue Memory Recall Blue: Without Cue Memory Recall Bed: Without Cue Awareness: Appears intact Problem Solving: Appears intact Safety/Judgment: Appears intact Sensation Sensation Light Touch: Appears Intact Hot/Cold: Appears Intact Proprioception: Appears Intact  Stereognosis: Not tested Coordination Gross Motor Movements are Fluid and Coordinated: No Fine Motor Movements are Fluid and Coordinated: No Coordination and Movement Description: generalized weakness and coordination Motor  Motor Motor - Skilled Clinical Observations: pt is very deconditioned and declines in strength rapidly with movement  Trunk/Postural Assessment  Thoracic Assessment Thoracic Assessment: Exceptions to [facility] (rounded shoulders, kyphotic) Lumbar Assessment Lumbar Assessment: Exceptions to [facility] (posterior pelvic tilt) Postural Control Postural Control: Deficits on evaluation (limited due to weakness)  Balance Static Sitting Balance Static Sitting - Level of Assistance: 5: Stand by assistance Dynamic Sitting Balance Dynamic Sitting - Level of Assistance: 3: Mod assist Static Standing Balance Static Standing - Level of Assistance: 3: Mod assist Dynamic Standing Balance Dynamic Standing - Level of Assistance: 1: +1 Total assist Extremity/Trunk Assessment RUE Assessment Active Range of Motion (AROM) Comments: WFL General Strength Comments: 3+/5 LUE Assessment Active Range of Motion (AROM) Comments: WFL General Strength Comments: 3+/5  Care Tool Care Tool Self Care Eating   Eating Assist Level: Set up assist    Oral Care    Oral Care Assist Level: Set up assist (no teeth, only uses mouthwash)    Bathing   Body parts bathed by patient: Right arm;Left arm;Chest;Abdomen;Front perineal area;Face Body parts bathed by helper:  Buttocks;Right upper leg;Left upper leg;Right lower leg;Left lower leg   Assist Level: Maximal Assistance - Patient 24 - 49%    Upper Body Dressing(including orthotics)       Assist Level: Contact Guard/Touching assist    Lower Body Dressing (excluding footwear)   What is the patient wearing?: Pants;Underwear/pull up Assist for lower body dressing: 2 Helpers    Putting on/Taking off footwear   What is the patient wearing?: Non-skid slipper socks Assist for footwear: Contact Guard/Touching assist       Care Tool Toileting Toileting activity   Assist for toileting: 2 Helpers     Care Tool Bed Mobility Roll left and right activity        Sit to lying activity   Sit to lying assist level: Minimal Assistance - Patient > 75%    Lying to sitting on side of bed activity   Lying to sitting on side of bed assist level: the ability to move from lying on the back to sitting on the side of the bed with no back support.: Minimal Assistance - Patient > 75%     Care Tool Transfers Sit to stand transfer   Sit to stand assist level: 2 Helpers    Chair/bed transfer   Chair/bed transfer assist level: 2 Pension scheme manager transfer   Assist Level: 2 Helpers     Care Tool Cognition  Expression of Ideas and Wants Expression of Ideas and Wants: 4. Without difficulty (complex and basic) - expresses complex messages without difficulty and with speech that is clear and easy to understand  Understanding Verbal and Non-Verbal Content Understanding Verbal and Non-Verbal Content: 4. Understands (complex and basic) - clear comprehension without cues or repetitions   Memory/Recall Ability Memory/Recall Ability : That he or she is in a hospital/hospital unit;Current season;Location of own room   Refer to Care Plan for Long Term Goals  SHORT TERM GOAL WEEK 1 OT Short Term Goal 1 (Week 1): Pt will be able to don pants with min A or less. OT Short Term Goal 2 (Week 1): Pt will be able to  consistently complete sit to stands with mod A or less. OT Short Term Goal 3 (  Week 1): Pt will complete squat pivot transfers to toilet with min A. OT Short Term Goal 4 (Week 1): Pt will engage in room HEP for his UEs in between therapy sessions.  Recommendations for other services: Neuropsych   Skilled Therapeutic Intervention ADL ADL Eating: Set up Grooming: Setup Upper Body Bathing: Setup Lower Body Bathing: Maximal assistance Upper Body Dressing: Setup Lower Body Dressing: Maximal assistance Toileting: Maximal assistance Toilet Transfer: Maximal assistance Toilet Transfer Method: Squat pivot Toilet Transfer Equipment: Raised toilet seat;Grab bars    Pt seen for initial evaluation and ADL training with a focus on activity tolerance and functional mobility. Pt sat to EOB with min A then c/o dizziness. Blood pressure 97/63. Had pt lie back down with A to get legs into bed. Pt rested briefly then sat back up. BP 100/66.  Pt stated he was slightly dizzy but willing to try to get up. Pt stood up from ELEVATED bed with mod A to RW then ambulated to elevated toilet seat with mod A with RW for trunk stability.  Once on toilet, pt did not need to void but was then totally unable to lift up from the seat.  Had to do a MAX A squat pivot to wc.  Pt positioned at sink in w/c to begin engaging in self care. Hand off to next therapist to continue with self care tasks.    Discussed role of OT, pt's goals, ELOS, POC.   Discharge Criteria: Patient will be discharged from OT if patient refuses treatment 3 consecutive times without medical reason, if treatment goals not met, if there is a change in medical status, if patient makes no progress towards goals or if patient is discharged from hospital.  The above assessment, treatment plan, treatment alternatives and goals were discussed and mutually agreed upon: by patient  [facility] 06/23/2021, 1:44 PM

## 2021-06-23 NOTE — Consult Note (Addendum)
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  Seen by my associate D. Barbie Haggis, at another facility on 06/09/2021. Wound type:Pressure Pressure Injury POA: Yes Measurements: Sacral: Unstageable pressure injury: 13cm x 8cm area of pink tissue with 7cm x 3cm area of dry brown eschar in center over coccyx. Moderate amount yellow exudate on old dressing Lett heel, plantar aspect: 1cm x 0.8cm x 0.2cm full thickness with red, center, white macerated and raised periphery. No educate on old dressing Right heel, plantar aspect: healed, dry ulcer with dried serum (scab). Very dry and peeling periwound and skin on foot. Xerosis. No exudate on old dressing. Wound bed:As described above Drainage (amount, consistency, odor) As described above Periwound: intact, very dry. Dressing procedure/placement/frequency: I implement a mattress replacement with low air loss feature for pressure redistribution while in the supine position. The sacral wound would benefit from enzymatic debridement in the center, so I will provide guidance for once daily application of collagenase (Santyl) topped with moistened gauze, dry gauze and then silicone foam.  The left heel will be dressed with a silver hydrofiber to manage moisture in the periwound area and be changed daily. Moisturizing of the legs and feet (but not between toes) with Eucerin once daily will be added. I will provide a pressure redistribution chair cushion for when OOB to chair but this will not replace a specialized cushion for wheelchair use (see below).  Suggest an assessment of the patient's foot wear by an orthotist since the wounds are in an atypical location for pressure post discharge. Also, pressure redistribution while sitting will need to be addressed by a physical therapist specializing in off loading cushions for wheelchair either while in house or post discharge.   Thank you for  inviting [place] to consult on this patient. [place] nursing team will not follow, but will remain available to this patient, the nursing and medical teams.  Please re-consult if needed. Thanks, Maudie Flakes, MSN, RN, [place], Arther Abbott  Pager# [phone]

## 2021-06-23 NOTE — Progress Notes (Signed)
Inpatient Rehabilitation Center Individual Statement of Services  Patient Name:  Jonathan Mcmillan  Date:  06/23/2021  [facility]  Our goal is to provide you with an individualized program based on your diagnosis and situation, designed to meet your specific needs.  With this comprehensive rehabilitation program, you will be expected to participate in at least 3 hours of rehabilitation therapies Monday-Friday, with modified therapy programming on the weekends.  Your rehabilitation program will include the following services:  Physical Therapy (PT), Occupational Therapy (OT), 24 hour per day rehabilitation nursing, Therapeutic Recreaction (TR), Neuropsychology, Care Coordinator, Rehabilitation Medicine, Nutrition Services, and Pharmacy Services  Weekly team conferences will be held on Tuesday to discuss your progress.  Your Inpatient Rehabilitation Care Coordinator will talk with you frequently to get your input and to update you on team discussions.  Team conferences with you and your family in attendance may also be held.  Expected length of stay: 14-16 days  Overall anticipated outcome: CGA-Min assist level  Depending on your progress and recovery, your program may change. Your Inpatient Rehabilitation Care Coordinator will coordinate services and will keep you informed of any changes. Your Inpatient Rehabilitation Care Coordinator's name and contact numbers are listed  below.  The following services may also be recommended but are not provided by the [place] will be made to provide these services after discharge if needed.  Arrangements include referral to agencies that provide these services.  Your insurance has been verified to be:  None Your primary doctor is:  [facility]  Pertinent  information will be shared with your doctor and your insurance company.  Inpatient Rehabilitation Care Coordinator:  Ovidio Kin, [place] or Emilia Beck  Information discussed with and copy given to patient by: Elease Hashimoto, 06/23/2021, 1:41 PM

## 2021-06-23 NOTE — Progress Notes (Signed)
Occupational Therapy Session Note  Patient Details  Name: Jonathan Mcmillan MRN: [id number] Date of Birth: 02/28/1996  Today's Date: 06/24/2021 OT Individual Time: 7741-2878 OT Individual Time Calculation (min): 89 min    Short Term Goals: Week 1:  OT Short Term Goal 1 (Week 1): Pt will be able to don pants with min A or less. OT Short Term Goal 2 (Week 1): Pt will be able to consistently complete sit to stands with mod A or less. OT Short Term Goal 3 (Week 1): Pt will complete squat pivot transfers to toilet with min A. OT Short Term Goal 4 (Week 1): Pt will engage in room HEP for his UEs in between therapy sessions.  Skilled Therapeutic Interventions/Progress Updates:    Pt greeted in bed with no c/o pain, RN present and finishing up with his morning medication. Pt reported taking a shower with nursing staff this AM. He declined starting session by participating in toileting or grooming tasks at the sink. Setup for supine<sit and Min-Mod A for stand pivot<w/c using RW. Worked on UB strength/endurance while pt self propelled his w/c to the dayroom. Worked on sit<stands and standing endurance by engaging in [place] boxing. Pt seeming to really enjoy these activities, experiencing flow. Mod-Max A for sit<stands with vcs for hand placement. Pt able to stand for 10 minute intervals before needing seated rest. Min-Mod balance assist in standing, more assist needed when his posterior lean increased. Pt able to correct upright alignment with vcs. Noted more posterior lean when he was standing and engaged in a bimanual activity (boxing using nunchuck controller) vs engaged in unilaterally involved activity (bowling). He then self propelled to the food court downstairs, vcs for increasing efficiency of w/c pushes. To work on pt education and functional cognitoin, pt used the kiosk in [place] to navigate the nutrition tab. Pt has a strict carb allotment pertaining to his diabetic diet. Vcs and assist  for this, pt reports that he needs glasses but doesn't own any, unable to read smaller font. We weren't able to find any food that he liked so we worked on core strength via w/c push ups with isometric holds. Pt able to sustain hold for ~1 to 2 seconds, able to unweight buttocks but unable to fully clear from cushion. Discussed importance of building strength in midranges to improve consistency of sit<stand levels. He self propelled back to his room and we discussed how to set up/use the gait belt to perform ankle and hamstring stretches. He reports doing leg exercises independently in the room. Mod A for sit<stand and CGA for stand pivot<bed using RW. Pt remained EOB at close of session, all needs within reach and bed alarm set.   Therapy Documentation Precautions:  Precautions Precautions: Fall Precaution Comments: R temporary femoral dialysis catheter removed 06/20/21, low activity tolerance, L lateral heel and foot wound, sacral wound Restrictions Weight Bearing Restrictions: No  ADL: ADL Eating: Set up Grooming: Setup Upper Body Bathing: Setup Lower Body Bathing: Maximal assistance Upper Body Dressing: Setup Lower Body Dressing: Maximal assistance Toileting: Maximal assistance Toilet Transfer: Maximal assistance Toilet Transfer Method: Squat pivot Toilet Transfer Equipment: Raised toilet seat, Grab bars      Therapy/Group: Individual Therapy  Imelda Dandridge A Raymondo Garcialopez 06/24/2021, 1:17 PM

## 2021-06-23 NOTE — Progress Notes (Signed)
Pt has critical Hgb of 6.7 and calcium of 6.2. Katharine Look PA notified. Pt currently in Hemodialysis.

## 2021-06-23 NOTE — Discharge Summary (Signed)
Physician Discharge Summary  Patient ID: [name] MRN: [id number] DOB/AGE: February 01, 1996 26 y.o.  Admit date: 06/22/2021 Discharge date: 06/29/2021  Discharge Diagnoses:  Principal Problem:   Anoxic encephalopathy ([place]) Active Problems:   Type 1 diabetes mellitus with complication (HCC)   ESRD on dialysis ([place])   Debility End-stage renal disease on hemodialysis Sacral decubitus ulcer Left heel ulcer Heart failure Atrial fibrillation Hypertension Anemia Tobacco use GERD Thrombocytopenia Diarrhea  Discharged Condition: fair  Significant Diagnostic Studies: DG Chest 1 View  Result Date: 06/03/2021 CLINICAL DATA:  Post intubation EXAM: CHEST  1 VIEW COMPARISON:  06/02/2021, 05/31/2021, 03/08/2020 FINDINGS: Tip of the endotracheal tube is about 3.3 cm superior to carina. Cardiomegaly with bilateral effusions and worsened basilar airspace disease. Vascular congestion. Esophageal tube has been removed. IMPRESSION: 1. Endotracheal tube tip about 3.3 cm superior to carina. 2. Cardiomegaly with interim development of bilateral effusions and basilar airspace disease. Vascular congestion and mild edema. Overall aeration is worse as compared with 06/02/2021. Electronically Signed   By: Donavan Foil M.D.   On: 06/03/2021 23:24   DG Chest 2 View  Result Date: 05/31/2021 CLINICAL DATA:  Cough.  Fever. EXAM: CHEST - 2 VIEW COMPARISON:  Multiple chest XRs, most recently 03/08/2020. CT chest, 01/02/2020. FINDINGS: Cardiomediastinal silhouette is within normal limits given positioning and technique. Lungs are well inflated. No focal consolidation or mass. No pleural effusion or pneumothorax. No acute displaced fracture. IMPRESSION: No active cardiopulmonary disease. Electronically Signed   By: Michaelle Birks M.D.   On: 05/31/2021 07:34   DG Abd 1 View  Result Date: 06/09/2021 CLINICAL DATA:  NG tube placement. EXAM: ABDOMEN - 1 VIEW COMPARISON:  KUB, most recently 06/09/2021. CT chest and pelvis,  06/03/2018. FINDINGS: Support lines: Enteric feeding tube, with tip and side port within stomach. Imaged bowel gas is nonobstructed. No radio-opaque calculi or other significant radiographic abnormality are seen. IMPRESSION: 1. Gastric placement of enteric feeding tube 2. Nonobstructed gas pattern of the imaged bowel. Electronically Signed   By: Michaelle Birks M.D.   On: 06/09/2021 10:27   DG Abd 1 View  Result Date: 06/09/2021 CLINICAL DATA:  Nasogastric tube placement EXAM: ABDOMEN - 1 VIEW COMPARISON:  06/04/2021 FINDINGS: Enteric tube descends below the diaphragm with the side port and tip overlying the stomach. This appears to be a different tube than on the prior 06/04/2021 radiograph. Nonobstructive bowel-gas pattern. No portal venous gas or pneumatosis. The lung bases are clear. No acute skeletal abnormality. IMPRESSION: Enteric tube in appropriate position. Electronically Signed   By: Yvonne Kendall M.D.   On: 06/09/2021 10:25   DG Abd 1 View  Result Date: 06/04/2021 CLINICAL DATA:  OG tube. EXAM: ABDOMEN - 1 VIEW COMPARISON:  CT CAP, 06/03/2021.  KUB, 06/03/2021 and 06/01/2021. FINDINGS: Support lines: Enteric tube with tip and side port within stomach. RIGHT femoral dialysis catheter, incompletely imaged. Overlying pacer leads. The bowel gas pattern is normal.  No interval osseous abnormality. IMPRESSION: 1. Well-positioned enteric feeding tube, with tip and side port within stomach. Additional lines and tubes as above. 2. Nonobstructed bowel-gas. Electronically Signed   By: Michaelle Birks M.D.   On: 06/04/2021 08:39   DG Abd 1 View  Result Date: 06/03/2021 CLINICAL DATA:  Distension EXAM: ABDOMEN - 1 VIEW COMPARISON:  06/01/2021 FINDINGS: Nonobstructed gas pattern with paucity of bowel gas. Bilateral femoral catheters, right catheter tip over lies the right aspect of the L2 vertebral body. Left catheter tip overlies the upper aspect of  left sacrum. IMPRESSION: Nonobstructed gas pattern with  overall paucity of bowel gas. Esophageal tube has been removed. Electronically Signed   By: Donavan Foil M.D.   On: 06/03/2021 23:25   DG Abd 1 View  Result Date: 06/01/2021 CLINICAL DATA:  Encounter for feeding tube placement EXAM: ABDOMEN - 1 VIEW COMPARISON:  X-ray abdomen 06/11/2020. FINDINGS: Enteric tube coursing below the hemidiaphragm with tip and side port overlying the gastric lumen. Tip likely in the region of the pylorus/first portion of the duodenum. Bilateral femoral approach catheters noted with left catheter overlying the L5 vertebral body and right catheter with tip overlying the right aspect of the L1 vertebral body. Nonobstructive bowel gas pattern. Slight distension of the small bowel with gas. No radio-opaque calculi or other significant radiographic abnormality are seen. IMPRESSION: Enteric tube coursing below within the gastric lumen with tip overlying the expected region of the pylorus. Consider retracting by 3 cm. Electronically Signed   By: Iven Finn M.D.   On: 06/01/2021 18:11   CT HEAD WO CONTRAST (5MM)  Result Date: 06/04/2021 CLINICAL DATA:  Initial evaluation for neuro deficit, stroke suspected. EXAM: CT HEAD WITHOUT CONTRAST TECHNIQUE: Contiguous axial images were obtained from the base of the skull through the vertex without intravenous contrast. RADIATION DOSE REDUCTION: This exam was performed according to the departmental dose-optimization program which includes automated exposure control, adjustment of the mA and/or kV according to patient size and/or use of iterative reconstruction technique. COMPARISON:  Prior CT from 06/01/2021. FINDINGS: Brain: Cerebral volume stable, and remains within normal limits. No acute intracranial hemorrhage. No acute large vessel territory infarct. No mass lesion or midline shift. No hydrocephalus or extra-axial fluid collection. Vascular: No hyperdense vessel. Skull: Scalp soft tissues demonstrate no new abnormality. Suspected  anasarca. Calvarium intact. Sinuses/Orbits: Globes and orbital soft tissues demonstrate no acute finding. Scattered mucosal thickening noted about the sphenoid ethmoidal and maxillary sinuses. Few scattered superimposed small air-fluid levels noted. Trace bilateral mastoid effusions. Other: None. IMPRESSION: 1. Stable head CT with no acute intracranial abnormality. 2. Scattered paranasal sinus disease with superimposed small air-fluid levels, suggesting acute sinusitis. Associated trace mastoid effusions. Appearance is similar to previous. Electronically Signed   By: Jeannine Boga M.D.   On: 06/04/2021 01:14   CT HEAD WO CONTRAST (5MM)  Result Date: 06/01/2021 CLINICAL DATA:  Altered mental status EXAM: CT HEAD WITHOUT CONTRAST TECHNIQUE: Contiguous axial images were obtained from the base of the skull through the vertex without intravenous contrast. RADIATION DOSE REDUCTION: This exam was performed according to the departmental dose-optimization program which includes automated exposure control, adjustment of the mA and/or kV according to patient size and/or use of iterative reconstruction technique. COMPARISON:  05/21/2016 FINDINGS: Brain: No acute intracranial findings are seen. There are no signs of bleeding within the cranium. Ventricles are not dilated. There is no shift of midline structures. There are no epidural or subdural fluid collections. Vascular: Unremarkable. Skull: Unremarkable. Sinuses/Orbits: Air-fluid level is seen in the sphenoid sinus. There is mucosal thickening in the ethmoid and maxillary sinuses. Small air-fluid levels are seen in both maxillary sinuses. Other: None IMPRESSION: No acute intracranial findings are seen in noncontrast CT brain. Chronic sinusitis. Air-fluid levels are seen in the sphenoid and maxillary sinuses suggesting possible acute sinusitis. Electronically Signed   By: Elmer Picker M.D.   On: 06/01/2021 17:03   CT CHEST WO CONTRAST  Result Date:  05/31/2021 CLINICAL DATA:  Body aches, chills, nausea since Sunday. EXAM: CT CHEST WITHOUT CONTRAST  TECHNIQUE: Multidetector CT imaging of the chest was performed following the standard protocol without IV contrast. RADIATION DOSE REDUCTION: This exam was performed according to the departmental dose-optimization program which includes automated exposure control, adjustment of the mA and/or kV according to patient size and/or use of iterative reconstruction technique. COMPARISON:  Same day chest radiograph, CT chest 01/02/2020 FINDINGS: Cardiovascular: The heart is not enlarged. There is trace pericardial fluid. The blood pool is hypodense suggesting anemia. The vasculature is unremarkable, as can be assessed in the absence of intravenous contrast. Mediastinum/Nodes: The thyroid is unremarkable. The esophagus is grossly unremarkable. There is no mediastinal or axillary lymphadenopathy. There is no bulky hilar adenopathy, suboptimally evaluated in the absence of intravenous contrast. Lungs/Pleura: The trachea and central airways are patent. The lungs are well inflated. There is no focal consolidation or pulmonary edema. There is no pleural effusion or pneumothorax. Upper Abdomen: The imaged portions of the upper abdominal viscera are unremarkable. Musculoskeletal: Multiple remote right-sided rib fractures are seen. There is no acute osseous abnormality or aggressive osseous lesion. Other: There is mild diffuse body wall edema. IMPRESSION: 1. Trace pericardial effusion and mild diffuse body wall edema, new since 01/02/2020. 2. Clear lungs, with no focal consolidation or pleural effusion. 3. Hypodense blood pool consistent with anemia. Electronically Signed   By: Valetta Mole M.D.   On: 05/31/2021 12:08   US RENAL  Result Date: 05/31/2021 CLINICAL DATA:  Acute kidney injury EXAM: RENAL / URINARY TRACT ULTRASOUND COMPLETE COMPARISON:  06/10/2020 FINDINGS: Right Kidney: Renal measurements: 10.9 x 6.1 x 7.0 cm =  volume: 244 mL. Increased cortical echogenicity with medical renal disease. Trace retroperitoneal perinephric edema. No hydronephrosis focal abnormality. Left Kidney: Renal measurements: 11.1 x 6.9 x 6.5 cm = volume: 257 mL. Similar increased cortical echogenicity compatible with medical renal disease. Similar trace retroperitoneal perinephric fluid/edema. No hydronephrosis. No focal abnormality. Bladder: Nearly collapsed, accounting for wall prominence Other: None. IMPRESSION: Increased renal echogenicity compatible with medical renal disease. Retroperitoneal perinephric edema suspect related to volume overload. Electronically Signed   By: Jerilynn Mages.  Shick M.D.   On: 05/31/2021 14:01   PERIPHERAL VASCULAR CATHETERIZATION  Result Date: 06/19/2021 See surgical note for result.  PERIPHERAL VASCULAR CATHETERIZATION  Result Date: 06/15/2021 See surgical note for result.  US Venous Img Lower Bilateral (DVT)  Result Date: 06/04/2021 CLINICAL DATA:  Initial evaluation for bilateral lower extremity swelling. EXAM: BILATERAL LOWER EXTREMITY VENOUS DOPPLER ULTRASOUND TECHNIQUE: Gray-scale sonography with graded compression, as well as color Doppler and duplex ultrasound were performed to evaluate the lower extremity deep venous systems from the level of the common femoral vein and including the common femoral, femoral, profunda femoral, popliteal and calf veins including the posterior tibial, peroneal and gastrocnemius veins when visible. The superficial great saphenous vein was also interrogated. Spectral Doppler was utilized to evaluate flow at rest and with distal augmentation maneuvers in the common femoral, femoral and popliteal veins. COMPARISON:  None. FINDINGS: RIGHT LOWER EXTREMITY Common Femoral Vein: No evidence of thrombus. Normal compressibility, respiratory phasicity and response to augmentation. Saphenofemoral Junction: No evidence of thrombus. Normal compressibility and flow on color Doppler imaging.  Profunda Femoral Vein: No evidence of thrombus. Normal compressibility and flow on color Doppler imaging. Femoral Vein: No evidence of thrombus. Normal compressibility, respiratory phasicity and response to augmentation. Popliteal Vein: No evidence of thrombus. Normal compressibility, respiratory phasicity and response to augmentation. Calf Veins: No evidence of thrombus. Normal compressibility and flow on color Doppler imaging. Superficial Great Saphenous Vein: No  evidence of thrombus. Normal compressibility. Venous Reflux:  None. Other Findings:  None. LEFT LOWER EXTREMITY Common Femoral Vein: Not seen, obscured by overlying bandaging material. Saphenofemoral Junction: Not seen, obscured by overlying bandaging material. Profunda Femoral Vein: Not seen, obscured by overlying bandaging material. Femoral Vein: No evidence of thrombus. Normal compressibility, respiratory phasicity and response to augmentation. Popliteal Vein: No evidence of thrombus. Normal compressibility, respiratory phasicity and response to augmentation. Calf Veins: No evidence of thrombus. Normal compressibility and flow on color Doppler imaging. Superficial Great Saphenous Vein: No evidence of thrombus. Normal compressibility. Venous Reflux:  None. Other Findings:  None. IMPRESSION: 1. Nonvisualization of the left common femoral vein, saphenofemoral junction, and profunda femoral vein, obscured by overlying bandaging material. 2. Otherwise no evidence of deep venous thrombosis in either lower extremity. Electronically Signed   By: Jeannine Boga M.D.   On: 06/04/2021 05:30   DG Chest Port 1 View  Result Date: 06/08/2021 CLINICAL DATA:  Support devices EXAM: PORTABLE CHEST 1 VIEW COMPARISON:  Chest x-ray dated June 05, 2021 FINDINGS: Unchanged position of ET tube. Interval placement of esophageal temperature probe with tip just below the thoracic inlet. New left IJ line with tip positioned over the expected area of the upper right  atrium. Cardiac and mediastinal contours are unchanged. Interval resolution of bibasilar opacities. No large pleural effusion or evidence of pneumothorax. IMPRESSION: 1. New left IJ line with tip positioned over the expected area of the upper right atrium. 2. Interval placement of enteric tube with tip overlying the stomach and side port near the GE junction, recommend advancement for optimal positioning. 3. Interval resolution of bibasilar opacities. Electronically Signed   By: Yetta Glassman M.D.   On: 06/08/2021 13:37   DG Chest Port 1 View  Result Date: 06/02/2021 CLINICAL DATA:  Acute respiratory failure, hypoxia EXAM: PORTABLE CHEST 1 VIEW COMPARISON:  Chest radiograph 1 day prior FINDINGS: The endotracheal tube is approximately 2.6 cm from the carina. The enteric catheter tip is off the field of view. A presumed esophageal temperature probe terminates in the upper thorax. The cardiac silhouette is enlarged, unchanged. The mediastinal contours are stable. There are patchy opacities in the lung bases, particularly on the right, likely not significantly changed in the interim. There is no significant pleural effusion. There is no pneumothorax. There is no acute osseous abnormality. IMPRESSION: 1. Unchanged enlargement of the cardiac silhouette. 2. Patchy bibasilar opacities are not significantly changed and may reflect atelectasis or developing infection. 3. Presumed esophageal temperature probe terminating in the upper thorax. Endotracheal tube in satisfactory position. Enteric catheter tip is off the field of view. Electronically Signed   By: Valetta Mole M.D.   On: 06/02/2021 07:25   DG Chest Port 1 View  Result Date: 06/01/2021 CLINICAL DATA:  Intubation. EXAM: PORTABLE CHEST 1 VIEW COMPARISON:  Chest radiographs and CT 05/31/2021 FINDINGS: An endotracheal tube has been placed and terminates proximally 2.5 cm above the carina. Telemetry leads overlie the chest. The cardiac silhouette is accentuated  by portable AP technique although true mild interval enlargement, such as from a pericardial effusion, is not excluded. Lung volumes are lower than on the prior radiographs, and there are mild opacities in both lung bases. No sizable pleural effusion or pneumothorax is identified. IMPRESSION: 1. Endotracheal tube in appropriate position. 2. Low lung volumes with mild bibasilar opacities which may reflect atelectasis or infection. Electronically Signed   By: Logan Bores M.D.   On: 06/01/2021 13:56   ECHOCARDIOGRAM COMPLETE  Result Date: 06/01/2021    ECHOCARDIOGRAM REPORT   Patient Name:   MAANAV KASSABIAN Date of Exam: 06/01/2021 Medical Rec #:  [id number]       Height:       68.0 in Accession #:    [id number]      Weight:       145.0 lb Date of Birth:  1995-06-20       BSA:          1.783 m Patient Age:    25 years        BP:           155/83 mmHg Patient Gender: M               HR:           95 bpm. Exam Location:  [facility] Procedure: 2D Echo, Color Doppler, Cardiac Doppler and Strain Analysis Indications:     I46.9 Cardiac arrest  History:         Patient has no prior history of Echocardiogram examinations.                  ESRD, stage IV, Signs/Symptoms:Fever; Risk Factors:Diabetes.  Sonographer:     Charmayne Sheer Referring Phys:  [id number] Bradly Bienenstock Diagnosing Phys: Ida Rogue MD  Sonographer Comments: Echo performed with patient supine and on artificial respirator. Global longitudinal strain was attempted. IMPRESSIONS  1. Left ventricular ejection fraction, by estimation, is 35 to 40%. The left ventricle has moderately decreased function. The left ventricle demonstrates global hypokinesis. There is moderate left ventricular hypertrophy. Left ventricular diastolic parameters are consistent with Grade II diastolic dysfunction (pseudonormalization). The average left ventricular global longitudinal strain is -11.3 %. The global longitudinal strain is abnormal.  2. Right ventricular systolic function is mildly  reduced. The right ventricular size is normal. There is moderately elevated pulmonary artery systolic pressure. The estimated right ventricular systolic pressure is 40.8 mmHg.  3. Left atrial size was mildly dilated.  4. The mitral valve is normal in structure. Mild mitral valve regurgitation. No evidence of mitral stenosis.  5. The aortic valve is normal in structure. Aortic valve regurgitation is mild. No aortic stenosis is present.  6. The inferior vena cava is dilated in size with <50% respiratory variability, suggesting right atrial pressure of 15 mmHg. FINDINGS  Left Ventricle: Left ventricular ejection fraction, by estimation, is 35 to 40%. The left ventricle has moderately decreased function. The left ventricle demonstrates global hypokinesis. The average left ventricular global longitudinal strain is -11.3 %. The global longitudinal strain is abnormal. The left ventricular internal cavity size was normal in size. There is moderate left ventricular hypertrophy. Left ventricular diastolic parameters are consistent with Grade II diastolic dysfunction (pseudonormalization). Right Ventricle: The right ventricular size is normal. No increase in right ventricular wall thickness. Right ventricular systolic function is mildly reduced. There is moderately elevated pulmonary artery systolic pressure. The tricuspid regurgitant velocity is 2.80 m/s, and with an assumed right atrial pressure of 15 mmHg, the estimated right ventricular systolic pressure is 14.4 mmHg. Left Atrium: Left atrial size was mildly dilated. Right Atrium: Right atrial size was normal in size. Pericardium: There is no evidence of pericardial effusion. Mitral Valve: The mitral valve is normal in structure. Mild mitral valve regurgitation. No evidence of mitral valve stenosis. MV peak gradient, 5.4 mmHg. The mean mitral valve gradient is 3.0 mmHg. Tricuspid Valve: The tricuspid valve is normal in structure. Tricuspid valve regurgitation is mild . No  evidence of tricuspid stenosis. Aortic Valve: The aortic valve is normal in structure. Aortic valve regurgitation is mild. No aortic stenosis is present. Aortic valve mean gradient measures 6.0 mmHg. Aortic valve peak gradient measures 10.1 mmHg. Aortic valve area, by VTI measures 2.64  cm. Pulmonic Valve: The pulmonic valve was normal in structure. Pulmonic valve regurgitation is trivial. No evidence of pulmonic stenosis. Aorta: The aortic root is normal in size and structure. Venous: The inferior vena cava is dilated in size with less than 50% respiratory variability, suggesting right atrial pressure of 15 mmHg. IAS/Shunts: No atrial level shunt detected by color flow Doppler.  LEFT VENTRICLE PLAX 2D LVIDd:         4.90 cm   Diastology LVIDs:         3.79 cm   LV e' medial:    8.59 cm/s LV PW:         1.43 cm   LV E/e' medial:  14.4 LV IVS:        0.93 cm   LV e' lateral:   9.46 cm/s LVOT diam:     2.10 cm   LV E/e' lateral: 13.1 LV SV:         58 LV SV Index:   33        2D Longitudinal Strain LVOT Area:     3.46 cm  2D Strain GLS Avg:     -11.3 %  RIGHT VENTRICLE RV Basal diam:  3.95 cm RV S prime:     11.40 cm/s LEFT ATRIUM             Index        RIGHT ATRIUM           Index LA diam:        4.40 cm 2.47 cm/m   RA Area:     20.10 cm LA Vol (A2C):   77.6 ml 43.53 ml/m  RA Volume:   60.00 ml  33.66 ml/m LA Vol (A4C):   73.9 ml 41.45 ml/m LA Biplane Vol: 79.4 ml 44.54 ml/m  AORTIC VALVE                     PULMONIC VALVE AV Area (Vmax):    2.40 cm      PV Vmax:       1.12 m/s AV Area (Vmean):   2.23 cm      PV Vmean:      82.500 cm/s AV Area (VTI):     2.64 cm      PV VTI:        0.187 m AV Vmax:           159.00 cm/s   PV Peak grad:  5.0 mmHg AV Vmean:          113.000 cm/s  PV Mean grad:  3.0 mmHg AV VTI:            0.220 m AV Peak Grad:      10.1 mmHg AV Mean Grad:      6.0 mmHg LVOT Vmax:         110.00 cm/s LVOT Vmean:        72.600 cm/s LVOT VTI:          0.168 m LVOT/AV VTI ratio: 0.76  AORTA Ao  Root diam: 2.80 cm MITRAL VALVE                TRICUSPID VALVE MV Area (PHT): 5.16 cm  TR Peak grad:   31.4 mmHg MV Area VTI:   2.47 cm     TR Vmax:        280.00 cm/s MV Peak grad:  5.4 mmHg MV Mean grad:  3.0 mmHg     SHUNTS MV Vmax:       1.16 m/s     Systemic VTI:  0.17 m MV Vmean:      77.4 cm/s    Systemic Diam: 2.10 cm MV Decel Time: 147 msec MV E velocity: 124.00 cm/s MV A velocity: 54.40 cm/s MV E/A ratio:  2.28 Ida Rogue MD Electronically signed by Ida Rogue MD Signature Date/Time: 06/01/2021/6:41:43 PM    Final    ECHOCARDIOGRAM LIMITED  Result Date: 06/06/2021    ECHOCARDIOGRAM LIMITED REPORT   Patient Name:   LLEYTON BYERS Date of Exam: 06/05/2021 Medical Rec #:  [id number]       Height:       68.0 in Accession #:    [id number]      Weight:       152.1 lb Date of Birth:  [birth year]       BSA:          1.819 m Patient Age:    25 years        BP:           102/80 mmHg Patient Gender: M               HR:           56 bpm. Exam Location:  [facility] Procedure: 2D Echo, Color Doppler and Cardiac Doppler Indications:     CHF I50.9  History:         Patient has prior history of Echocardiogram examinations, most                  recent 06/01/2021. Risk Factors:Diabetes.  Sonographer:     Sherrie Sport Referring Phys:  [id number] ADAM ROSS SCHERTZ Diagnosing Phys: Ida Rogue MD  Sonographer Comments: Echo performed with patient supine and on artificial respirator. IMPRESSIONS  1. Left ventricular ejection fraction, by estimation, is 35 to 40%. The left ventricle has moderately decreased function. The left ventricle demonstrates global hypokinesis. There is mild left ventricular hypertrophy. Left ventricular diastolic parameters are indeterminate.  2. Right ventricular systolic function is mildly reduced. The right ventricular size is normal. There is normal pulmonary artery systolic pressure. The estimated right ventricular systolic pressure is 49.7 mmHg.  3. The mitral valve is normal in structure.  Mild mitral valve regurgitation. No evidence of mitral stenosis.  4. Tricuspid valve regurgitation is mild to moderate.  5. The aortic valve has an indeterminant number of cusps. Aortic valve regurgitation is mild. No aortic stenosis is present.  6. The inferior vena cava is normal in size with greater than 50% respiratory variability, suggesting right atrial pressure of 3 mmHg. FINDINGS  Left Ventricle: Left ventricular ejection fraction, by estimation, is 35 to 40%. The left ventricle has moderately decreased function. The left ventricle demonstrates global hypokinesis. The left ventricular internal cavity size was normal in size. There is mild left ventricular hypertrophy. Left ventricular diastolic parameters are indeterminate. Right Ventricle: The right ventricular size is normal. No increase in right ventricular wall thickness. Right ventricular systolic function is mildly reduced. There is normal pulmonary artery systolic pressure. The tricuspid regurgitant velocity is 2.58 m/s, and with an assumed right atrial pressure of 5 mmHg, the estimated right ventricular systolic pressure is 31.6  mmHg. Left Atrium: Left atrial size was normal in size. Right Atrium: Right atrial size was normal in size. Pericardium: There is no evidence of pericardial effusion. Mitral Valve: The mitral valve is normal in structure. Mild mitral valve regurgitation. No evidence of mitral valve stenosis. Tricuspid Valve: The tricuspid valve is normal in structure. Tricuspid valve regurgitation is mild to moderate. No evidence of tricuspid stenosis. Aortic Valve: The aortic valve has an indeterminant number of cusps. Aortic valve regurgitation is mild. No aortic stenosis is present. Aortic valve mean gradient measures 3.5 mmHg. Aortic valve peak gradient measures 7.1 mmHg. Aortic valve area, by VTI measures 2.90 cm. Pulmonic Valve: The pulmonic valve was normal in structure. Pulmonic valve regurgitation is not visualized. No evidence of  pulmonic stenosis. Aorta: The aortic root is normal in size and structure. Venous: The inferior vena cava is normal in size with greater than 50% respiratory variability, suggesting right atrial pressure of 3 mmHg. IAS/Shunts: No atrial level shunt detected by color flow Doppler. LEFT VENTRICLE PLAX 2D LVIDd:         4.60 cm LVIDs:         4.00 cm LV PW:         1.60 cm LV IVS:        0.90 cm LVOT diam:     2.20 cm LV SV:         39 LV SV Index:   21 LVOT Area:     3.80 cm  RIGHT VENTRICLE RV Basal diam:  3.90 cm RV S prime:     11.00 cm/s TAPSE (M-mode): 1.8 cm LEFT ATRIUM             Index        RIGHT ATRIUM           Index LA diam:        3.40 cm 1.87 cm/m   RA Area:     13.20 cm LA Vol (A2C):   49.2 ml 27.04 ml/m  RA Volume:   29.90 ml  16.43 ml/m LA Vol (A4C):   31.9 ml 17.53 ml/m LA Biplane Vol: 41.9 ml 23.03 ml/m  AORTIC VALVE                    PULMONIC VALVE AV Area (Vmax):    2.38 cm     PV Vmax:        0.57 m/s AV Area (Vmean):   2.50 cm     PV Vmean:       35.800 cm/s AV Area (VTI):     2.90 cm     PV VTI:         0.060 m AV Vmax:           133.50 cm/s  PV Peak grad:   1.3 mmHg AV Vmean:          83.900 cm/s  PV Mean grad:   1.0 mmHg AV VTI:            0.134 m      RVOT Peak grad: 4 mmHg AV Peak Grad:      7.1 mmHg AV Mean Grad:      3.5 mmHg LVOT Vmax:         83.60 cm/s LVOT Vmean:        55.100 cm/s LVOT VTI:          0.102 m LVOT/AV VTI ratio: 0.76  AORTA Ao Root diam: 2.93 cm MITRAL VALVE  TRICUSPID VALVE MV Area (PHT): 3.65 cm     TR Peak grad:   26.6 mmHg MV Decel Time: 208 msec     TR Vmax:        258.00 cm/s MV E velocity: 101.00 cm/s                             SHUNTS                             Systemic VTI:  0.10 m                             Systemic Diam: 2.20 cm                             Pulmonic VTI:  0.115 m Ida Rogue MD Electronically signed by Ida Rogue MD Signature Date/Time: 06/06/2021/1:23:45 PM    Final    CT CHEST ABDOMEN PELVIS WO  CONTRAST  Result Date: 06/04/2021 CLINICAL DATA:  Sepsis. EXAM: CT CHEST, ABDOMEN AND PELVIS WITHOUT CONTRAST TECHNIQUE: Multidetector CT imaging of the chest, abdomen and pelvis was performed following the standard protocol without IV contrast. RADIATION DOSE REDUCTION: This exam was performed according to the departmental dose-optimization program which includes automated exposure control, adjustment of the mA and/or kV according to patient size and/or use of iterative reconstruction technique. COMPARISON:  Chest CT dated 05/31/2021. FINDINGS: Evaluation of this exam is limited in the absence of intravenous contrast. Evaluation is also limited due to streak artifact caused by patient's arms as well as anasarca. CT CHEST FINDINGS Cardiovascular: Top-normal cardiac size. No pericardial effusion. There is hypoattenuation of the cardiac blood pool suggestive of anemia. Clinical correlation is recommended. Mild atherosclerotic calcification of the aortic arch. The central pulmonary arteries are grossly unremarkable. Mediastinum/Nodes: Evaluation of the hilar and mediastinal structure is very limited due to factors above. No mediastinal fluid collection. Lungs/Pleura: Small bilateral pleural effusions. There are large areas of consolidative change involving the lower lobes, right greater left. There is near complete consolidation of the right lower lobe. There is impaction of the right lower lobe bronchus as well as impaction of the left lower lobe bronchi which may represent aspiration. No pneumothorax. Tracheostomy with tip approximately 3 cm above the carina. Musculoskeletal: Diffuse chest wall edema and anasarca. No acute osseous pathology. Old healed right posterior rib fracture. CT ABDOMEN PELVIS FINDINGS No intra-abdominal free air.  Small ascites. Hepatobiliary: The liver is unremarkable. Mild periportal edema. Probable gallbladder sludge. No calcified gallstone. Pancreas: Unremarkable. No pancreatic ductal  dilatation or surrounding inflammatory changes. Spleen: Normal in size without focal abnormality. Adrenals/Urinary Tract: The adrenal glands are unremarkable. There is no hydronephrosis or nephrolithiasis on either side. The urinary bladder is decompressed around a Foley catheter. Stomach/Bowel: A rectal tube is in place. No bowel obstruction. The appendix is unremarkable as visualized. Vascular/Lymphatic: The abdominal aorta and IVC are grossly unremarkable. Right femoral approach venous line with tip in the IVC inferior to the renal vein. Left femoral approach venous line with tip in the left common iliac vein. Right femoral arterial line with tip in the right external iliac artery. No portal venous gas. No adenopathy. Reproductive: The prostate and seminal vesicles are grossly unremarkable. Other: Diffuse subcutaneous edema and anasarca. Musculoskeletal: No acute or significant osseous findings.  IMPRESSION: 1. Impacted bilateral lower lobe bronchi with large areas of consolidation involving the lower lobes with near complete consolidation the right lower lobe, new since the prior CT. Findings likely represent postobstructive atelectasis or pneumonia. Aspiration is not excluded. Small bilateral pleural effusions also new since the prior CT. 2. Small ascites and anasarca. 3. Aortic Atherosclerosis (ICD10-I70.0). Electronically Signed   By: Anner Crete M.D.   On: 06/04/2021 00:07    Labs:  Basic Metabolic Panel: Recent Labs  Lab 06/23/21 1421 06/26/21 0623 06/28/21 1309  NA 136 138 138  K 5.0 5.4* 4.7  CL 102 102 102  CO2 25 27 25   GLUCOSE 179* 159* 195*  BUN 27* 36* 33*  CREATININE 4.37* 5.15* 5.01*  CALCIUM 6.2* 6.7* 7.0*  PHOS 6.2* 6.3* 6.2*    CBC: Recent Labs  Lab 06/23/21 1421 06/26/21 0623 06/28/21 1309  WBC 12.1* 12.6* 13.6*  NEUTROABS  --  9.3*  --   HGB 6.7* 8.5* 8.1*  HCT 21.1* 25.9* 25.9*  MCV 94.2 91.8 93.8  PLT 56* 64* 86*    CBG: Recent Labs  Lab 06/27/21 2121  06/28/21 0645 06/28/21 1214 06/28/21 2052 06/29/21 0601  GLUCAP 169* 104* 166* 167* 164*    Brief HPI:   Jonathan Mcmillan is a 26 y.o. male admitted on 2/7 from [facility] after presenting with EVAR, chills and vomiting.  He was noted to have profound anemia and significantly elevated serum creatinine above baseline level.  Suffered PEA arrest most likely due to metabolic derangement.  Required intubation, ventilation critical care management.  Required CRRT progressed to intermittent hemodialysis.  History of diabetes mellitus type 1.  Atrial fibrillation no AC due to thrombocytopenia. Heart failure now on Entresto. Severe thrombocytopenia status post platelet infusion.  Developed significant diarrhea and once discharged from ICU, underwent colonoscopy.  Biopsy results pending.  Likely mild anoxic brain injury.  Developed sacral decubitus and left heel ulcers.   Hospital Course: TRELLIS VANOVERBEKE was admitted to rehab 06/22/2021 for inpatient therapies to consist of PT, ST and OT at least three hours five days a week. Past admission physiatrist, therapy team and rehab RN have worked together to provide customized collaborative inpatient rehab.  Wound care consultation obtained.  Continue to monitor CBG pattern for control.  Semglee discontinued. Platelet count mildly improved.  Follow-up CBC revealed persistent decrease in hemoglobin of 6.7.  Received 1 unit packed red blood cells on 06/23/2021.  Repeat CBC on 2/13 H&H now 8.5 and 25.9 respectively.  Low but stable platelet count. Imodium continues for loose stools. Patient refusing to take Creon saying it is the reason for his diarrhea. Also, patient and family confused regarding diet and food from outside. Registered dietician consultation arranged. Lower extremity edema likely to sodium in diet>>patient counseled. Patient overall discouraged with treatment plan and therapy and desired early discharge. Family and patient education and  instruction given for wound care.  Blood pressures were monitored on TID basis and remained stable.  Diabetes has been monitored with ac/hs CBG checks. SSI was use prn for tighter BS control. No oral agents due to DM type 1.   Rehab course: During patient's stay in rehab weekly team conferences were held to monitor patient's progress, set goals and discuss barriers to discharge. At admission, patient required +2 for lower body dressing and for sit to stand.  Upper body dressing with CGA.  Max A for squat pivot transfer to bed.  He has had improvement in  activity tolerance, balance, postural control as well as ability to compensate for deficits. He has had improvement in functional use RUE/LUE  and RLE/LLE as well as improvement in awareness  Disposition: Home Discharge disposition: 01-Home or Self Care      Diet: Renal/carb modified  Special Instructions:  Arrive at 11am on Friday for dialysis at [facility]  Recommend evaluation by foot orthotic footwear due to presence of ulcers atypical for pressure injury.  No driving, alcohol consumption or tobacco use.   30-35 minutes were spent on discharge planning and discharge summary.  Discharge Instructions     Discharge patient   Complete by: As directed    Discharge disposition: 01-Home or Self Care   Discharge patient date: 06/29/2021      Allergies as of 06/29/2021   No Known Allergies      Medication List     TAKE these medications    (feeding supplement) PROSource Plus liquid Take 30 mLs by mouth 3 (three) times daily between meals.   Accu-Chek Softclix Lancets lancets Use aas directed up to 4 times per day.   acetaminophen 325 MG tablet Commonly known as: TYLENOL Take 1-2 tablets (325-650 mg total) by mouth every 4 (four) hours as needed for mild pain.   amiodarone 200 MG tablet Commonly known as: PACERONE Take 1 tablet (200 mg total) by mouth daily.   ascorbic acid 500 MG tablet Commonly known as:  VITAMIN C Take 1 tablet (500 mg total) by mouth 2 (two) times daily.   Blood Glucose Monitor System w/Device Kit Use as directed up to 4 times per day.   calcitRIOL 0.5 MCG capsule Commonly known as: ROCALTROL Take 1 capsule (0.5 mcg total) by mouth daily.   calcium carbonate 500 MG chewable tablet Commonly known as: TUMS - dosed in mg elemental calcium Chew 1 tablet (200 mg of elemental calcium total) by mouth 3 (three) times daily.   carvedilol 3.125 MG tablet Commonly known as: COREG Take 1 tablet (3.125 mg total) by mouth 2 (two) times daily with a meal.   cinacalcet 30 MG tablet Commonly known as: SENSIPAR Take 1 tablet (30 mg total) by mouth daily with breakfast.   collagenase ointment Commonly known as: SANTYL Apply topically daily.   Darbepoetin Alfa 60 MCG/0.3ML Sosy injection Commonly known as: ARANESP Inject 0.3 mLs (60 mcg total) into the vein every Friday with hemodialysis. Start taking on: June 30, 2021   ferrous sulfate 325 (65 FE) MG tablet Take 1 tablet (325 mg total) by mouth daily with breakfast.   glucose blood test strip Use as directed   hydrocerin Crea Apply 1 application topically 2 (two) times daily.   insulin aspart 100 UNIT/ML FlexPen Commonly known as: NOVOLOG Inject 0-6 Units into the skin 3 (three) times daily with meals. What changed: You were already taking a medication with the same name, and this prescription was added. Make sure you understand how and when to take each.   insulin aspart 100 UNIT/ML injection Commonly known as: novoLOG Inject 0-15 Units into the skin 3 (three) times daily with meals. For blood glucose to 70-150 give 0 units; 151 to 200 give 1 unit; 201 to 250 give 2 units, 251-300 give 3 units. 301 or greater, call primary care physician.. What changed: additional instructions   Insulin Pen Needle 32G X 4 MM Misc Use as directed   lipase/protease/amylase 36000 UNITS Cpep capsule Commonly known as: CREON Take  1 capsule (36,000 Units total) by mouth  3 (three) times daily with meals.   loperamide 2 MG capsule Commonly known as: IMODIUM Take 1 capsule (2 mg total) by mouth 2 (two) times daily.   methocarbamol 500 MG tablet Commonly known as: ROBAXIN Take 1 tablet (500 mg total) by mouth every 6 (six) hours as needed for muscle spasms.   multivitamin Tabs tablet Take 1 tablet by mouth at bedtime.   omeprazole 40 MG capsule Commonly known as: PRILOSEC Take 1 capsule (40 mg total) by mouth daily.   ondansetron 4 MG tablet Commonly known as: Zofran Take 1 tablet (4 mg total) by mouth every 8 (eight) hours as needed for up to 10 doses for nausea or vomiting.   oxyCODONE 5 MG immediate release tablet Commonly known as: Oxy IR/ROXICODONE Take 1 tablet (5 mg total) by mouth every 4 (four) hours as needed for severe pain.   sacubitril-valsartan 24-26 MG Commonly known as: ENTRESTO Take 1 tablet by mouth 2 (two) times daily.   thiamine 100 MG tablet Take 1 tablet (100 mg total) by mouth daily.        Follow-up Information     Dialysis, [facility]. Go on 06/30/2021.   Why: Schedule is Monday/Wednesday/Friday with 11:45 chair time.  Patient needs to arive on Friday at 11:00 to complete paperwork prior to treatment. Contact information: [place] [address] [phone]         Center, [place]. Call in 1 day(s).   Specialty: General Practice Why: for post hospital follow-up; follow-up foot wounds Contact information: [place]. [place] [address] [place]. Call.   Why: Call on Friday to arrange hospital follow-up appointment Contact information: [place] [address] [phone]        Charlett Blake, MD Follow up.   Specialty: Physical Medicine and Rehabilitation Why: As needed Contact information: [place] [address] [phone]                 Signed: Barbie Banner 06/29/2021, 9:49 AM

## 2021-06-23 NOTE — Progress Notes (Signed)
PROGRESS NOTE   Subjective/Complaints: Pt getting up with therapy. Denies any issues. Didn't want to offer much.   ROS: Limited due to cognitive/behavioral    Objective:   No results found. Recent Labs    06/22/21 2019  WBC 11.1*  HGB 6.6*  HCT 20.7*  PLT 54*   No results for input(s): NA, K, CL, CO2, GLUCOSE, BUN, CREATININE, CALCIUM in the last 72 hours.  Intake/Output Summary (Last 24 hours) at 06/23/2021 0954 Last data filed at 06/23/2021 0910 Gross per 24 hour  Intake 720 ml  Output --  Net 720 ml     Pressure Injury 06/01/21 Buttocks Right;Lower Stage 2 -  Partial thickness loss of dermis presenting as a shallow open injury with a red, pink wound bed without slough. (Active)  06/01/21 1030  Location: Buttocks  Location Orientation: Right;Lower  Staging: Stage 2 -  Partial thickness loss of dermis presenting as a shallow open injury with a red, pink wound bed without slough.  Wound Description (Comments):   Present on Admission: Yes    Physical Exam: Vital Signs Blood pressure 113/73, pulse 79, temperature 98.4 F (36.9 C), temperature source Oral, resp. rate 18, height 5\' 8"  (1.727 m), weight 71.1 kg, SpO2 100 %.  General: Alert.  No apparent distress HEENT: Head is normocephalic, atraumatic, PERRLA, EOMI, sclera anicteric, oral mucosa pink and moist, dentition intact, ext ear canals clear,  Neck: Supple without JVD or lymphadenopathy Heart: Reg rate and rhythm. No murmurs rubs or gallops Chest: CTA bilaterally without wheezes, rales, or rhonchi; no distress Abdomen: Soft, non-tender, non-distended, bowel sounds positive. Extremities: No clubbing, cyanosis, or edema. Pulses are 2+ Psych: Pt's affect is appropriate. Pt is cooperative Skin: sacral wound with eschar, exudate, Left foot with abrasions, Right foot with eschar/slough on plantar aspect Neuro:  Pt alert, oriented to self, hospital. Follows basic  commands. Normal language, speech low volume. Fair insight. Good bed mobility. Motor grossly 4/5 UE and LE. Senses pain and light touch with sl decreased distally Musculoskeletal: Full ROM, No pain with AROM or PROM in the neck, trunk, or extremities. Posture appropriate     Assessment/Plan: 1. Functional deficits which require 3+ hours per day of interdisciplinary therapy in a comprehensive inpatient rehab setting. Physiatrist is providing close team supervision and 24 hour management of active medical problems listed below. Physiatrist and rehab team continue to assess barriers to discharge/monitor patient progress toward functional and medical goals  Care Tool:  Bathing              Bathing assist       Upper Body Dressing/Undressing Upper body dressing   What is the patient wearing?: Pull over shirt    Upper body assist Assist Level: Moderate Assistance - Patient 50 - 74%    Lower Body Dressing/Undressing Lower body dressing      What is the patient wearing?: Pants     Lower body assist Assist for lower body dressing: Moderate Assistance - Patient 50 - 74%     Toileting Toileting    Toileting assist Assist for toileting: Minimal Assistance - Patient > 75%     Transfers Chair/bed transfer  Transfers assist  Locomotion Ambulation   Ambulation assist              Walk 10 feet activity   Assist           Walk 50 feet activity   Assist           Walk 150 feet activity   Assist           Walk 10 feet on uneven surface  activity   Assist           Wheelchair     Assist Is the patient using a wheelchair?: No             Wheelchair 50 feet with 2 turns activity    Assist            Wheelchair 150 feet activity     Assist          Blood pressure 113/73, pulse 79, temperature 98.4 F (36.9 C), temperature source Oral, resp. rate 18, height 5\' 8"  (1.727 m), weight 71.1 kg, SpO2  100 %.  Medical Problem List and Plan: 1. Functional deficits secondary to anoxic brain injury from  renal failure, heart failure, PEA arrest              -patient may not shower due to HD catheter             -ELOS/Goals: 12-14 days- supervision  -Patient is beginning CIR therapies today including PT, OT, and SLP  2.  Antithrombotics: -DVT/anticoagulation:  Pharmaceutical: Lovenox             -antiplatelet therapy: none 3. Pain Management: Tylenol, oxycodone  -appears controlled at present 4. Mood: LCSW to evaluate and provide emotional support             -antipsychotic agents: n/a 5. Neuropsych: This patient is capable of making decisions on his own behalf. 6. Skin/Wound Care: Sacral and Left heel unstageable/ decubitus ulcer wound care:   -air  mattress, appropriate w/c cushion -WOC nurse consult- santyl to sacral wound -foam dressings for feet.              --Eucerin for dry skin  -consider orthotic shoes for foot wounds, 7. Fluids/Electrolytes/Nutrition: Fluid restriction 1200 mL daily             --per nephrology             --renal/carb modified diet. Continue supplements 8: ESRD on iHD via right IJ TDC on MWF schedule 9: DM 1: Continue Semglee 5 units at bedtime, SSI meal/HS coverage; CBGs- no oral agents due to type 1 DM  CBG (last 3)  Recent Labs    06/22/21 0830 06/22/21 1133 06/22/21 2107  GLUCAP 173* 209* 109*    2/10 inconsistent control at present--monitor for pattern for now 10: Heart failure: Continue Entresto BID, Coreg 11: Atrial fibrillation: Continue amiodarone BID; ? need for AC 12: Hypertension: continue Coreg 13: Anemia, chronic disease: Is getting iron supplementation  2/10 hgb up to 7.3 today--recheck Monday 14: Tobacco use: provide cessation counseling             --? Nicotine patch 15: GERD: Continue PPI 16: Thrombocytopenia: Continue to monitor platelet count             --No signs of bleeding  2/10 platelets 62k (54 previously) 17:  Aspiration pneumonia: treated and resolved   19: Diarrhea: + stool lactoferrin; C.diff was negative                 --  colonoscopy with biopsies 2/8             --continued scheduled loperamide for now and continue to monitor BMs   -stools semi-formed right now  LOS: 1 days A FACE TO FACE EVALUATION WAS PERFORMED  Meredith Staggers 06/23/2021, 9:54 AM

## 2021-06-23 NOTE — Progress Notes (Signed)
Occupational Therapy Session Note  Patient Details  Name: Jonathan Mcmillan MRN: [id number] Date of Birth: 1995-12-13  Today's Date: 06/23/2021 OT Individual Time: 0122-2411 OT Individual Time Calculation (min): 70 min    Short Term Goals: Week 1:     Skilled Therapeutic Interventions/Progress Updates:    OTA initiated intervention following consultation with evaluating OTR regarding POC. Pt completed dressing tasks with sit<>stand from w/c at sink. +2 for LB dressing for sit<>stand. UB dressing with CGA. Pt engaged in BUE therex for conditioning/endurance-8 mins SciFit level 4. Pt transitioned to day room for sit<>stand. Pt unable to perform sit<>stand. Demonstrated w/c pushups but pt unable to initiate/perform. Pt kicked large therapy ball with BLE 3x10 and again with 1# ankle weights 3x10. Attempted sit<>stand again but unsuccessful. Pt returned to room. Max A for squat pivot transfer to bed. Pt commented that he was exhausted. Pt remained in bed with all needs within reach. Bed alarm acativated.   Therapy Documentation Precautions:  Restrictions Weight Bearing Restrictions: No   Pain: Pain Assessment Pain Score: 0-No pain   Therapy/Group: Individual Therapy  Leroy Libman 06/23/2021, 12:07 PM

## 2021-06-23 NOTE — Procedures (Signed)
° °  I was present at this dialysis session, have reviewed the session itself and made  appropriate changes Towson Splinter MD [place] pager [phone]   06/23/2021, 5:21 PM

## 2021-06-23 NOTE — IPOC Note (Addendum)
Overall Plan of Care [facility]) Patient Details Name: Jonathan Mcmillan MRN: [id number] DOB: 06-02-1995  Admitting Diagnosis: Anoxic encephalopathy (HCC)anoxic bi  Hospital Problems: Principal Problem:   Anoxic encephalopathy ([place]) Active Problems:   Type 1 diabetes mellitus with complication ([place])   ESRD on dialysis [facility])   Debility     Functional Problem List: Nursing Bladder, Medication Management, Safety, Bowel, Skin Integrity, Nutrition, Pain, Endurance  PT Balance, Endurance, Motor, Nutrition, Perception, Safety, Sensory, Skin Integrity  OT Balance, Endurance, Motor  SLP    TR         Basic ADLs: OT Bathing, Dressing, Toileting     Advanced  ADLs: OT       Transfers: PT Bed Mobility, Bed to Chair, Car, Hydrographic surveyor, Other (comment)  OT Toilet     Locomotion: PT Ambulation, Wheelchair Mobility, Stairs     Additional Impairments: OT None  SLP        TR      Anticipated Outcomes Item Anticipated Outcome  Self Feeding independent  Swallowing      Basic self-care  set up UB self care, CGA LB self care  Toileting  CGA to min A   Bathroom Transfers Min A to toilet  Bowel/Bladder  manage bowel w min assist and bladder with min assist  Transfers  SUP  Locomotion  CGA/ SUP  Communication     Cognition     Pain  pain at or below level 4 with prns  Safety/Judgment  maintain safety w cues   Therapy Plan: PT Intensity: Minimum of 1-2 x/day ,45 to 90 minutes PT Frequency: 5 out of 7 days PT Duration Estimated Length of Stay: 2 weeks OT Intensity: Minimum of 1-2 x/day, 45 to 90 minutes OT Frequency: 5 out of 7 days OT Duration/Estimated Length of Stay: 14-16 days     Due to the current state of emergency, patients may not be receiving their 3-hours of Medicare-mandated therapy.   Team Interventions: Nursing Interventions Bladder Management, Disease Management/Prevention, Medication Management, Discharge Planning, Skin Care/Wound Management, Pain  Management, Bowel Management, Patient/Family Education, Dysphagia/Aspiration Precaution Training  PT interventions Ambulation/gait training, Discharge planning, Functional mobility training, Psychosocial support, Therapeutic Activities, Visual/perceptual remediation/compensation, Balance/vestibular training, Disease management/prevention, Neuromuscular re-education, Skin care/wound management, Therapeutic Exercise, Wheelchair propulsion/positioning, Cognitive remediation/compensation, DME/adaptive equipment instruction, Pain management, UE/LE Strength taining/ROM, Community reintegration, Technical sales engineer stimulation, Patient/family education, IT trainer, UE/LE Coordination activities  OT Interventions Training and development officer, DME/adaptive equipment instruction, Discharge planning, Functional mobility training, Pain management, Patient/family education, Self Care/advanced ADL retraining, Psychosocial support, Therapeutic Activities, Therapeutic Exercise, UE/LE Strength taining/ROM, UE/LE Coordination activities  SLP Interventions    TR Interventions    SW/CM Interventions Discharge Planning, Psychosocial Support, Patient/Family Education   Barriers to Discharge MD  Medical stability  Nursing Decreased caregiver support, Incontinence, Hemodialysis, Wound Care 1 level ramped entry w aunt who works during the day; family to assist when she is working  PT Tree surgeon, Incontinence, Wound Care, Lack of/limited family support, Insurance underwriter for SNF coverage, Hemodialysis, Nutrition means, Decreased caregiver support    [place] for SNF coverage, Hemodialysis     Team Discharge Planning: Destination: PT-Home ,OT- Home , SLP-  Projected Follow-up: PT-Home health PT, OT-  Home health OT, SLP-  Projected Equipment Needs: PT-To be determined, OT- 3 in 1 bedside comode, SLP-  Equipment Details: PT- , OT-  Patient/family involved in discharge planning: PT-  Patient,  OT-Patient, SLP-  MD ELOS: 14-16 days Medical Rehab Prognosis:  Excellent Assessment: The patient has been admitted for CIR therapies with the diagnosis of anoxic BI, debility after multiple medical. The team will be addressing functional mobility, strength, stamina, balance, safety, adaptive techniques and equipment, self-care, bowel and bladder mgt, patient and caregiver education, NMR, cognition, community reentry. Goals have been set at supervision to min assist with mobility, self-care and mod I with cognition.   Due to the current state of emergency, patients may not be receiving their 3 hours per day of Medicare-mandated therapy.    Meredith Staggers, MD, FAAPMR     See Team Conference Notes for weekly updates to the plan of care

## 2021-06-24 LAB — GLUCOSE, CAPILLARY
Glucose-Capillary: 169 mg/dL — ABNORMAL HIGH (ref 70–99)
Glucose-Capillary: 132 mg/dL — ABNORMAL HIGH (ref 70–99)

## 2021-06-24 NOTE — Progress Notes (Signed)
Occupational Therapy Session Note  Patient Details  Name: Jonathan Mcmillan MRN: [id number] Date of Birth: Apr 14, 1996  Today's Date: 06/24/2021 OT Individual Time: 1500-1530 OT Individual Time Calculation (min): 30 min    Short Term Goals: Week 1:  OT Short Term Goal 1 (Week 1): Pt will be able to don pants with min A or less. OT Short Term Goal 2 (Week 1): Pt will be able to consistently complete sit to stands with mod A or less. OT Short Term Goal 3 (Week 1): Pt will complete squat pivot transfers to toilet with min A. OT Short Term Goal 4 (Week 1): Pt will engage in room HEP for his UEs in between therapy sessions.  Skilled Therapeutic Interventions/Progress Updates:    Pt resting in bed stating he was very tired after PT but agreeable to working with me.  Pt sat to EOB with S.   Engaged in UE strengthening and endurance exercises with 2# dowel bar focusing on shoulder, chest and bicep strength. Pt would get very fatigued after only 5 sh presses and needed frequent rest breaks but was able to participate for the full session.  He could do up to 15 reps of elb flex, chest presses, etc at a time.   Used soccer ball from sitting EOB for "medicine ball" chops focusing on core strength. From elevated bed sit to squat 5 x5.  Isometric squats for 5 second holds.  Unable to push to stand without A from arms. At end of session, needed A to lift legs into bed. Pt resting in bed with all needs met. Alarm on.   Therapy Documentation Precautions:  Precautions Precautions: Fall Precaution Comments: R temporary femoral dialysis catheter removed 06/20/21, low activity tolerance, L lateral heel and foot wound, sacral wound Restrictions Weight Bearing Restrictions: No   Pain: Pain Assessment Pain Scale: 0-10 Pain Score: Asleep Pain Type: Acute pain Pain Location: Sacrum Pain Orientation: Mid Pain Descriptors / Indicators: Aching Pain Frequency: Intermittent Pain Onset: On-going Patients Stated  Pain Goal: 2 Pain Intervention(s): Medication (See eMAR) ADL: ADL Eating: Set up Grooming: Setup Upper Body Bathing: Setup Lower Body Bathing: Maximal assistance Upper Body Dressing: Setup Lower Body Dressing: Maximal assistance Toileting: Maximal assistance Toilet Transfer: Maximal assistance Toilet Transfer Method: Squat pivot Toilet Transfer Equipment: Raised toilet seat, Grab bars   Therapy/Group: Individual Therapy  [place] 06/24/2021, 1:41 PM

## 2021-06-24 NOTE — Progress Notes (Signed)
PROGRESS NOTE   Subjective/Complaints:  Pt absolutely insistent still very hungry- said ate multiple pancakes and grits- explained that's too many carbs- will try and increase carbs to 120 carbs not 80 carbs, however will not double.   LBM yesterday.  BG 169- before breakfast.   We called dietary and will change his carbs.    ROS: limited due to behavior/cognition   Objective:   No results found. Recent Labs    06/23/21 0950 06/23/21 1421  WBC 10.9* 12.1*  HGB 7.3* 6.7*  HCT 22.8* 21.1*  PLT 62* 56*   Recent Labs    06/23/21 1421  NA 136  K 5.0  CL 102  CO2 25  GLUCOSE 179*  BUN 27*  CREATININE 4.37*  CALCIUM 6.2*    Intake/Output Summary (Last 24 hours) at 06/24/2021 1155 Last data filed at 06/24/2021 6734 Gross per 24 hour  Intake 1640 ml  Output 2575 ml  Net -935 ml     Pressure Injury 06/01/21 Buttocks Right;Lower Stage 2 -  Partial thickness loss of dermis presenting as a shallow open injury with a red, pink wound bed without slough. (Active)  06/01/21 1030  Location: Buttocks  Location Orientation: Right;Lower  Staging: Stage 2 -  Partial thickness loss of dermis presenting as a shallow open injury with a red, pink wound bed without slough.  Wound Description (Comments):   Present on Admission: Yes    Physical Exam: Vital Signs Blood pressure 128/84, pulse 86, temperature 99.8 F (37.7 C), temperature source Oral, resp. rate 16, height 5\' 8"  (1.727 m), weight 65.3 kg, SpO2 100 %.    General: awake, alert, very irritable; NAD HENT: conjugate gaze; oropharynx moist CV: regular rate; no JVD Pulmonary: CTA B/L; no W/R/R- good air movement GI: soft, NT, ND, (+)BS Psychiatric: very irritable Neurological: alert, but belligerent about diet- won't talk except to discuss diet Extremities: No clubbing, cyanosis, or edema. Pulses are 2+ Psych: Pt's affect is appropriate. Pt is cooperative Skin:  sacral wound with eschar, exudate, Left foot with abrasions, Right foot with eschar/slough on plantar aspect Neuro:  Pt alert, oriented to self, hospital. Follows basic commands. Normal language, speech low volume. Fair insight. Good bed mobility. Motor grossly 4/5 UE and LE. Senses pain and light touch with sl decreased distally Musculoskeletal: Full ROM, No pain with AROM or PROM in the neck, trunk, or extremities. Posture appropriate     Assessment/Plan: 1. Functional deficits which require 3+ hours per day of interdisciplinary therapy in a comprehensive inpatient rehab setting. Physiatrist is providing close team supervision and 24 hour management of active medical problems listed below. Physiatrist and rehab team continue to assess barriers to discharge/monitor patient progress toward functional and medical goals  Care Tool:  Bathing    Body parts bathed by patient: Front perineal area, Buttocks   Body parts bathed by helper: Buttocks, Right upper leg, Left upper leg, Right lower leg, Left lower leg     Bathing assist Assist Level: Moderate Assistance - Patient 50 - 74%     Upper Body Dressing/Undressing Upper body dressing   What is the patient wearing?: Pull over shirt    Upper body assist  Assist Level: Contact Guard/Touching assist    Lower Body Dressing/Undressing Lower body dressing      What is the patient wearing?: Pants, Underwear/pull up     Lower body assist Assist for lower body dressing: Moderate Assistance - Patient 50 - 74%     Toileting Toileting    Toileting assist Assist for toileting: 2 Helpers     Transfers Chair/bed transfer  Transfers assist     Chair/bed transfer assist level: Moderate Assistance - Patient 50 - 74%     Locomotion Ambulation   Ambulation assist      Assist level: Minimal Assistance - Patient > 75% Assistive device: Walker-rolling Max distance: 150 ft   Walk 10 feet activity   Assist     Assist level:  Minimal Assistance - Patient > 75% Assistive device: Walker-rolling   Walk 50 feet activity   Assist    Assist level: Minimal Assistance - Patient > 75% Assistive device: Walker-rolling    Walk 150 feet activity   Assist    Assist level: Minimal Assistance - Patient > 75% Assistive device: Walker-rolling    Walk 10 feet on uneven surface  activity   Assist Walk 10 feet on uneven surfaces activity did not occur: Safety/medical concerns         Wheelchair     Assist Is the patient using a wheelchair?: Yes Type of Wheelchair: Manual    Wheelchair assist level: Total Assistance - Patient < 25% Max wheelchair distance: 150 ft    Wheelchair 50 feet with 2 turns activity    Assist        Assist Level: Total Assistance - Patient < 25%   Wheelchair 150 feet activity     Assist      Assist Level: Total Assistance - Patient < 25%   Blood pressure 128/84, pulse 86, temperature 99.8 F (37.7 C), temperature source Oral, resp. rate 16, height 5\' 8"  (1.727 m), weight 65.3 kg, SpO2 100 %.  Medical Problem List and Plan: 1. Functional deficits secondary to anoxic brain injury from  renal failure, heart failure, PEA arrest              -patient may not shower due to HD catheter             -ELOS/Goals: 12-14 days- supervision  -con't CIR- PT, OT and SLP 2.  Antithrombotics: -DVT/anticoagulation:  Pharmaceutical: Lovenox             -antiplatelet therapy: none 3. Pain Management: Tylenol, oxycodone  -appears controlled at present 4. Mood: LCSW to evaluate and provide emotional support             -antipsychotic agents: n/a 5. Neuropsych: This patient is capable of making decisions on his own behalf. 6. Skin/Wound Care: Sacral and Left heel unstageable/ decubitus ulcer wound care:   -air  mattress, appropriate w/c cushion -WOC nurse consult- santyl to sacral wound -foam dressings for feet.              --Eucerin for dry skin  -consider orthotic shoes  for foot wounds, 7. Fluids/Electrolytes/Nutrition: Fluid restriction 1200 mL daily             --per nephrology             --renal/carb modified diet. Continue supplements 8: ESRD on iHD via right IJ TDC on MWF schedule 9: DM 1: Continue Semglee 5 units at bedtime, SSI meal/HS coverage; CBGs- no oral agents due to type 1 DM  CBG (last 3)  Recent Labs    06/23/21 1151 06/23/21 2119 06/24/21 0641  GLUCAP 103* 188* 169*    2/10 inconsistent control at present--monitor for pattern for now  2/11- not on Semglee right now- will see how CBGs go- might have to restart- increased carbs to 120 carbs- but not higher- and will titrate insulin based on this-  10: Heart failure: Continue Entresto BID, Coreg 11: Atrial fibrillation: Continue amiodarone BID; ? need for [facility] 12: Hypertension: continue Coreg  2/11- BP controlled- con't regimen 13: Anemia, chronic disease: Is getting iron supplementation  2/10 hgb up to 7.3 today--recheck Monday 14: Tobacco use: provide cessation counseling             --? Nicotine patch 15: GERD: Continue PPI 16: Thrombocytopenia: Continue to monitor platelet count             --No signs of bleeding  2/10 platelets 62k (54 previously) 17: Aspiration pneumonia: treated and resolved   19: Diarrhea: + stool lactoferrin; C.diff was negative                 --colonoscopy with biopsies 2/8             --continued scheduled loperamide for now and continue to monitor BMs   -stools semi-formed right now   I spent a total of  35  minutes on total care today- >50% coordination of care- due to d/w nursing as well as dietary about carb issues and reviewing insulin and CBGs.    LOS: 2 days A FACE TO FACE EVALUATION WAS PERFORMED  Lasasha Brophy 06/24/2021, 11:55 AM

## 2021-06-24 NOTE — Plan of Care (Signed)
°  Problem: RH KNOWLEDGE DEFICIT GENERAL Goal: RH STG INCREASE KNOWLEDGE OF SELF CARE AFTER HOSPITALIZATION Description: Patient and family will be able to manage care needs at discharge using handouts and educational resources independently Outcome: Progressing

## 2021-06-24 NOTE — Evaluation (Signed)
Physical Therapy Assessment and Plan  Patient Details  Name: Jonathan Mcmillan MRN: [id number] Date of Birth: 1995-06-28  PT Diagnosis: Difficulty walking, Dizziness and giddiness, Impaired sensation, and Muscle weakness Rehab Potential: Fair ELOS: 2 weeks   Today's Date: 06/23/2021 PT Individual Time: 0804-0900  PT Individual Time Calculation (min): 56 min  Hospital Problem: Principal Problem:   Anoxic encephalopathy ([place]) Active Problems:   Type 1 diabetes mellitus with complication ([place])   ESRD on dialysis ([place])   Debility   Past Medical History:  Past Medical History:  Diagnosis Date   Cannabinoid hyperemesis syndrome    CKD (chronic kidney disease), stage IV ([place])    Diabetes 1.5, managed as type 1 ([place])    DKA (diabetic ketoacidoses) 05/21/2016   Gastroparesis    HTN (hypertension)    Nicotine dependence    Perirectal abscess 06/16/2017   Right arm cellulitis 01/26/2018   Past Surgical History:  Past Surgical History:  Procedure Laterality Date   COLONOSCOPY WITH PROPOFOL N/A 06/21/2021   Procedure: COLONOSCOPY WITH PROPOFOL;  Surgeon: Lin Landsman, MD;  Location: [facility] ENDOSCOPY;  Service: Gastroenterology;  Laterality: N/A;   DIALYSIS/PERMA CATHETER INSERTION N/A 06/15/2021   Procedure: DIALYSIS/PERMA CATHETER INSERTION;  Surgeon: Algernon Huxley, MD;  Location: [place] [facility];  Service: Cardiovascular;  Laterality: N/A;   DIALYSIS/PERMA CATHETER INSERTION N/A 06/19/2021   Procedure: DIALYSIS/PERMA CATHETER INSERTION;  Surgeon: Algernon Huxley, MD;  Location: [place] [facility];  Service: Cardiovascular;  Laterality: N/A;   INCISION AND DRAINAGE PERIRECTAL ABSCESS N/A 06/16/2017   Procedure: IRRIGATION AND DEBRIDEMENT PERIRECTAL ABSCESS;  Surgeon: Florene Glen, MD;  Location: [facility] ORS;  Service: General;  Laterality: N/A;   none      Assessment & Plan Clinical Impression: Patient is a 26 y.o. male who presented to the [facility] emergency  department on 05/31/2021 with fever, chills and vomiting.  Initial work-up revealed profound anemia and significantly elevated serum creatinine above baseline level.  Respiratory viral panels including COVID and influenza were negative.  He was admitted for further evaluation and work-up.  Nephrology consultation obtained.   The patient's past medical history includes chronic kidney disease stage IV, hypertension, diabetes mellitus type 1, gastroesophageal reflux disease.  Primary care obtained at [facility].  Followed by Dr. Loney Loh nephrology last seen 08/01/2020.   On 06/01/2021, the patient collapsed while toileting and rapid response was called.  He lost his pulse and CODE BLUE was called.  Was initially asystolic but progressed to PEA.  ACLS protocol carried out and patient was intubated.  Regained pulse and transferred to the ICU.  Critical care management consulted.  Cardiology consultation obtained.  A left IJ temporary dialysis catheter was placed and seen RT initiated.  Concern for aspiration pneumonia, antibiotics started.  The echo revealed ejection fraction of 35 to 40%.  On 1/22, worsening thrombocytopenia noted and hematology consulted.  Platelet infusion given.  He was noted to be in atrial fibrillation with rapid ventricular response on 1/23.  On 1/25, palliative care consultation obtained.  Patient was extubated on 1/26.  Temporary dialysis catheter malfunctioned and a right femoral temporary catheter was placed on 1/28.  CRRT was discontinued and intermittent hemodialysis started on 1/30.  A vascular consultation was obtained and a right IJ tunneled dialysis catheter was placed by Dr. Lucky Cowboy on 2/6.  Patient was noted to have developed a stage II sacral and heel pressure injuries.  [name] was started for low  ejection fraction on 2/7.  Speech therapy evaluation carried out and the patient's diet was slowly advanced from dysphagia 1 to regular renal diet/carb modified  with fluid restriction of 1200 mL.   Gastroenterology consultation obtained on 2/7 secondary to ongoing nonbloody diarrhea with stool lactoferrin positivity.  He underwent colonoscopy on 2/8 with poor prep.  Biopsies of normal mucosa of the entire colon were obtained.  Patient will need to follow-up as outpatient for pathology report and further recommendations.  Patient transferred to CIR on 06/22/2021 .   Patient currently requires mod assist with mobility secondary to muscle weakness, decreased cardiorespiratoy endurance, impaired timing and sequencing and unbalanced muscle activation, ,, decreased safety awareness, ,, and decreased postural control.  Prior to hospitalization, patient was independent  with mobility and lived with Family (2 aunts) in a House home.  Home access is 6Ramped entrance.  Patient will benefit from skilled PT intervention to maximize safe functional mobility, minimize fall risk, and decrease caregiver burden for planned discharge home with 24 hour assist.  Anticipate patient will benefit from follow up [facility] at discharge.  PT - End of Session Activity Tolerance: Tolerates 30+ min activity with multiple rests Endurance Deficit: Yes Endurance Deficit Description: decreased blood pressure with activity, quick to fatigue with effort PT Assessment Rehab Potential (ACUTE/IP ONLY): Fair PT Barriers to Discharge: Home environment access/layout;Incontinence;Wound Care;Lack of/limited family support;Insurance for SNF coverage;Hemodialysis;Nutrition means;Decreased caregiver support PT Plan PT Intensity: Minimum of 1-2 x/day ,45 to 90 minutes PT Frequency: 5 out of 7 days PT Duration Estimated Length of Stay: 2 weeks PT Treatment/Interventions: Ambulation/gait training;Discharge planning;Functional mobility training;Psychosocial support;Therapeutic Activities;Visual/perceptual remediation/compensation;Balance/vestibular training;Disease management/prevention;Neuromuscular  re-education;Skin care/wound management;Therapeutic Exercise;Wheelchair propulsion/positioning;Cognitive remediation/compensation;DME/adaptive equipment instruction;Pain management;UE/LE Strength taining/ROM;Community reintegration;Functional electrical stimulation;Patient/family education;Stair training;UE/LE Coordination activities PT Recommendation Follow Up Recommendations: Home health PT Patient destination: Home Equipment Recommended: To be determined   PT Evaluation Precautions/Restrictions Precautions Precautions: Fall Precaution Comments: R temporary femoral dialysis catheter removed 06/20/21, low activity tolerance, L lateral heel and foot wound, sacral wound Restrictions Weight Bearing Restrictions: No General   Vital Signs  Pain Pain Assessment Pain Scale: 0-10 Pain Score: 0-No pain Pain Interference Pain Interference Pain Effect on Sleep: 2. Occasionally Pain Interference with Therapy Activities: 2. Occasionally Pain Interference with Day-to-Day Activities: 2. Occasionally Home Living/Prior Functioning Home Living Available Help at Discharge: Family;Available PRN/intermittently Type of Home: House Home Access: Ramped entrance Entrance Stairs-Number of Steps: 6 Home Layout: One level Bathroom Shower/Tub: Tub only;Tub/shower unit Bathroom Toilet: Handicapped height Bathroom Accessibility: Yes  Lives With: Family (2 aunts) Prior Function Level of Independence: Independent with basic ADLs;Independent with gait;Independent with transfers;Independent with homemaking with ambulation  Able to Take Stairs?: Yes Driving: Yes Vocation: Part time employment Vocation Requirements: worked in [place] to See in Adequate Light: 0 Adequate Perception Perception: Within Functional Limits Praxis Praxis: Intact  Cognition Overall Cognitive Status: Within Functional Limits for tasks assessed Arousal/Alertness: Awake/alert Year:  2023 Month: February Day of Week: Correct Memory: Appears intact Awareness: Appears intact Problem Solving: Appears intact Safety/Judgment: Appears intact Sensation Sensation Light Touch: Appears Intact Hot/Cold: Appears Intact Proprioception: Appears Intact Stereognosis: Not tested Coordination Gross Motor Movements are Fluid and Coordinated: No Fine Motor Movements are Fluid and Coordinated: No Coordination and Movement Description: generalized weakness and coordination Heel Shin Test: unable to perform hip flexion while seated bilaterally Motor  Motor Motor: Other (comment) Motor - Skilled Clinical Observations: severe deconditioning and declines in strength rapidly with movement  Trunk/Postural Assessment  Cervical Assessment Cervical Assessment: Within Functional Limits Thoracic Assessment Thoracic Assessment: Exceptions to [facility] (rounded shoulders) Lumbar Assessment Lumbar Assessment: Exceptions to [facility] (posterior pelvic tilt) Postural Control Postural Control: Deficits on evaluation (limited by strength deficits)  Balance Balance Balance Assessed: Yes Static Sitting Balance Static Sitting - Balance Support: Bilateral upper extremity supported;Feet supported Static Sitting - Level of Assistance: 5: Stand by assistance Dynamic Sitting Balance Dynamic Sitting - Balance Support: Bilateral upper extremity supported;Feet unsupported;Feet supported Dynamic Sitting - Level of Assistance: 4: Min Insurance risk surveyor Standing - Balance Support: Bilateral upper extremity supported Static Standing - Level of Assistance: 4: Min assist;3: Mod assist Dynamic Standing Balance Dynamic Standing - Balance Support: Bilateral upper extremity supported;During functional activity Dynamic Standing - Level of Assistance: 2: Max assist;3: Mod assist Extremity Assessment      RLE Assessment RLE Assessment: Exceptions to [facility] RLE Strength RLE Overall Strength:  Deficits Right Hip Flexion: 2/5 Right Hip Extension: 3-/5 Right Hip ABduction: 3+/5 Right Hip ADduction: 3+/5 Right Knee Flexion: 3-/5 Right Knee Extension: 4-/5 Right Ankle Dorsiflexion: 4-/5 Right Ankle Plantar Flexion: 4-/5 LLE Assessment LLE Assessment: Exceptions to WFL LLE Strength LLE Overall Strength: Deficits Left Hip Flexion: 2/5 Left Hip Extension: 3-/5 Left Hip ABduction: 3+/5 Left Hip ADduction: 3+/5 Left Knee Flexion: 3-/5 Left Knee Extension: 4-/5 Left Ankle Dorsiflexion: 4-/5 Left Ankle Plantar Flexion: 4-/5  Care Tool Care Tool Bed Mobility Roll left and right activity   Roll left and right assist level: Minimal Assistance - Patient > 75%    Sit to lying activity   Sit to lying assist level: Minimal Assistance - Patient > 75%    Lying to sitting on side of bed activity   Lying to sitting on side of bed assist level: the ability to move from lying on the back to sitting on the side of the bed with no back support.: Moderate Assistance - Patient 50 - 74%     Care Tool Transfers Sit to stand transfer   Sit to stand assist level: Moderate Assistance - Patient 50 - 74%    Chair/bed transfer   Chair/bed transfer assist level: Moderate Assistance - Patient 50 - 74%     Toilet transfer   Assist Level: Moderate Assistance - Patient 50 - 74%    Car transfer Car transfer activity did not occur: Safety/medical concerns        Care Tool Locomotion Ambulation   Assist level: Minimal Assistance - Patient > 75% Assistive device: Walker-rolling Max distance: 150 ft  Walk 10 feet activity   Assist level: Minimal Assistance - Patient > 75% Assistive device: Walker-rolling   Walk 50 feet with 2 turns activity   Assist level: Minimal Assistance - Patient > 75% Assistive device: Walker-rolling  Walk 150 feet activity   Assist level: Minimal Assistance - Patient > 75% Assistive device: Walker-rolling  Walk 10 feet on uneven surfaces activity Walk 10 feet on  uneven surfaces activity did not occur: Safety/medical concerns      Stairs Stair activity did not occur: Safety/medical concerns        Walk up/down 1 step activity Walk up/down 1 step or curb (drop down) activity did not occur: Safety/medical concerns      Walk up/down 4 steps activity Walk up/down 4 steps activity did not occur: Safety/medical concerns      Walk up/down 12 steps activity Walk up/down 12 steps activity did not occur: Safety/medical concerns  Pick up small objects from floor Pick up small object from the floor (from standing position) activity did not occur: Safety/medical concerns      Wheelchair Is the patient using a wheelchair?: Yes Type of Wheelchair: Manual   Wheelchair assist level: Total Assistance - Patient < 25% Max wheelchair distance: 150 ft  Wheel 50 feet with 2 turns activity   Assist Level: Total Assistance - Patient < 25%  Wheel 150 feet activity   Assist Level: Total Assistance - Patient < 25%    Refer to Care Plan for Long Term Goals  SHORT TERM GOAL WEEK 1    Recommendations for other services: None   Skilled Therapeutic Intervention Mobility Bed Mobility Bed Mobility: Rolling Right;Rolling Left;Left Sidelying to Sit;Sitting - Scoot to [employer] of Bed;Sit to Supine Rolling Right: Supervision/verbal cueing Rolling Left: Supervision/Verbal cueing Left Sidelying to Sit: Minimal Assistance - Patient >75% Sitting - Scoot to Edge of Bed: Supervision/Verbal cueing Sit to Supine: Minimal Assistance - Patient > 75% Transfers Transfers: Sit to Stand;Stand to Sit;Stand Pivot Transfers;Squat Pivot Transfers Sit to Stand: Moderate Assistance - Patient 50-74% Stand to Sit: Minimal Assistance - Patient > 75% Stand Pivot Transfers: Moderate Assistance - Patient 50 - 74%;Minimal Assistance - Patient > 75% Squat Pivot Transfers: Moderate Assistance - Patient 50-74% Transfer (Assistive device): Other (Comment) (bedrail) Locomotion   Gait Ambulation: Yes Gait Assistance: Contact Guard/Touching assist;Minimal Assistance - Patient > 75% Assistive device: Rolling walker Gait Assistance Details: Verbal cues for technique;Verbal cues for precautions/safety;Verbal cues for safe use of DME/AE Gait Gait: Yes Gait Pattern: Step-through pattern;Decreased stride length;Decreased hip/knee flexion - right;Decreased hip/knee flexion - left;Right foot flat;Left foot flat; uncontrolled genu recurvatem bilaterally Gait velocity: decreased Stairs / Additional Locomotion Stairs: No Wheelchair Mobility Wheelchair Mobility: No  Skilled Therapeutic Interventions: Patient supine in bed on entrance to room. Patient alert and agreeable to PT session though with apathetic affect. Has not had breakfast and kitchen staff brings tray. Pt allowed to start breakfast while w/c, RW procured for pt.   Once returned, pt's socks doffed with MaxA and feet inspected - Bil feet very dry with scaled, and flaked skin. Sole and heel of R foot have darkened areas of healed sores. Pt missing 5th ray to tarsal-metatarsal joint. L foot has dressing along lateral border and heel.   Patient with no pain complaint throughout session.  Therapeutic Activity: Bed Mobility: Patient performed supine <> sit with SUP to roll and push up to seated position on EOB but requiring MinA to attain seated balance. VC/ tc required for technique. With balance, tennis shoes donned with MaxA. Pt upset he does not have his slippers in his room.  Transfers: Patient performed sit<>stand and stand pivot transfers throughout session with MaxA from low bed height and MinA from elevated bed height. Once performed, pt able to perform from w/c with MinA until fatigued at end of session. Then requires split UE positioning with pull at bedrail and push from w/c with MinA for squat pivot w/c to bed. Provided verbal cues for technique and effort throughout.  Gait Training:  Patient ambulated total  of 150 ft including wide 180 degree turn and 90 degree turns using RW with MinA/ CGA. Demonstrated uncontrolled genurecurvatem, heavy UE pressure into walker, decreased step height/ length, and near steppage-style gait pattern. Provided vc/ tc for relating level of fatigue and weakness in BLE.   Patient seated EOB at end of session with brakes locked, bed alarm set, and all needs within reach.  Discharge Criteria: Patient will be discharged from PT if patient refuses treatment 3 consecutive times without medical reason, if treatment goals not met, if there is a change in medical status, if patient makes no progress towards goals or if patient is discharged from hospital.  The above assessment, treatment plan, treatment alternatives and goals were discussed and mutually agreed upon: by patient  [name] Simons PT, DPT 06/23/2021, 5:36 PM

## 2021-06-25 LAB — GLUCOSE, CAPILLARY
Glucose-Capillary: 185 mg/dL — ABNORMAL HIGH (ref 70–99)
Glucose-Capillary: 119 mg/dL — ABNORMAL HIGH (ref 70–99)
Glucose-Capillary: 102 mg/dL — ABNORMAL HIGH (ref 70–99)

## 2021-06-25 NOTE — Progress Notes (Signed)
PROGRESS NOTE   Subjective/Complaints:  Pt reports he needs to go to bathroom when he wants to- due to loose stools. Didn't call for nurse- just tried to get OOB.   Per staff, pt had subway yesterday with 36 oz drink from family- outside.     ROS: limited due to cognition/behavior   Objective:   No results found. Recent Labs    06/23/21 0950 06/23/21 1421  WBC 10.9* 12.1*  HGB 7.3* 6.7*  HCT 22.8* 21.1*  PLT 62* 56*   Recent Labs    06/23/21 1421  NA 136  K 5.0  CL 102  CO2 25  GLUCOSE 179*  BUN 27*  CREATININE 4.37*  CALCIUM 6.2*    Intake/Output Summary (Last 24 hours) at 06/25/2021 1448 Last data filed at 06/25/2021 1300 Gross per 24 hour  Intake 1250 ml  Output 200 ml  Net 1050 ml     Pressure Injury 06/01/21 Buttocks Right;Lower Stage 2 -  Partial thickness loss of dermis presenting as a shallow open injury with a red, pink wound bed without slough. (Active)  06/01/21 1030  Location: Buttocks  Location Orientation: Right;Lower  Staging: Stage 2 -  Partial thickness loss of dermis presenting as a shallow open injury with a red, pink wound bed without slough.  Wound Description (Comments):   Present on Admission: Yes    Physical Exam: Vital Signs Blood pressure 128/76, pulse 81, temperature 97.6 F (36.4 C), resp. rate 16, height 5\' 8"  (1.727 m), weight 71.2 kg, SpO2 100 %.     General: awake, alert, appropriate, sitting EOB; setting off bed alarm; nurse in room; NAD HENT: conjugate gaze; oropharynx moist CV: regular rate; no JVD Pulmonary: CTA B/L; no W/R/R- good air movement GI: soft, NT, ND, (+)BS- hyperactive Psychiatric: less irritable today- but still is to some extent Neurological: alert, trying to get OOB- poor memory Extremities: No clubbing, cyanosis, or edema. Pulses are 2+ Psych: Pt's affect is appropriate. Pt is cooperative Skin: sacral wound with eschar, exudate, Left foot  with abrasions, Right foot with eschar/slough on plantar aspect Neuro:  Pt alert, oriented to self, hospital. Follows basic commands. Normal language, speech low volume. Fair insight. Good bed mobility. Motor grossly 4/5 UE and LE. Senses pain and light touch with sl decreased distally Musculoskeletal: Full ROM, No pain with AROM or PROM in the neck, trunk, or extremities. Posture appropriate     Assessment/Plan: 1. Functional deficits which require 3+ hours per day of interdisciplinary therapy in a comprehensive inpatient rehab setting. Physiatrist is providing close team supervision and 24 hour management of active medical problems listed below. Physiatrist and rehab team continue to assess barriers to discharge/monitor patient progress toward functional and medical goals  Care Tool:  Bathing    Body parts bathed by patient: Front perineal area, Buttocks   Body parts bathed by helper: Buttocks, Right upper leg, Left upper leg, Right lower leg, Left lower leg     Bathing assist Assist Level: Moderate Assistance - Patient 50 - 74%     Upper Body Dressing/Undressing Upper body dressing   What is the patient wearing?: Pull over shirt    Upper body assist  Assist Level: Contact Guard/Touching assist    Lower Body Dressing/Undressing Lower body dressing      What is the patient wearing?: Pants, Underwear/pull up     Lower body assist Assist for lower body dressing: Moderate Assistance - Patient 50 - 74%     Toileting Toileting    Toileting assist Assist for toileting: 2 Helpers     Transfers Chair/bed transfer  Transfers assist     Chair/bed transfer assist level: Moderate Assistance - Patient 50 - 74%     Locomotion Ambulation   Ambulation assist      Assist level: Minimal Assistance - Patient > 75% Assistive device: Walker-rolling Max distance: 150 ft   Walk 10 feet activity   Assist     Assist level: Minimal Assistance - Patient > 75% Assistive  device: Walker-rolling   Walk 50 feet activity   Assist    Assist level: Minimal Assistance - Patient > 75% Assistive device: Walker-rolling    Walk 150 feet activity   Assist    Assist level: Minimal Assistance - Patient > 75% Assistive device: Walker-rolling    Walk 10 feet on uneven surface  activity   Assist Walk 10 feet on uneven surfaces activity did not occur: Safety/medical concerns         Wheelchair     Assist Is the patient using a wheelchair?: Yes Type of Wheelchair: Manual    Wheelchair assist level: Total Assistance - Patient < 25% Max wheelchair distance: 150 ft    Wheelchair 50 feet with 2 turns activity    Assist        Assist Level: Total Assistance - Patient < 25%   Wheelchair 150 feet activity     Assist      Assist Level: Total Assistance - Patient < 25%   Blood pressure 128/76, pulse 81, temperature 97.6 F (36.4 C), resp. rate 16, height 5\' 8"  (1.727 m), weight 71.2 kg, SpO2 100 %.  Medical Problem List and Plan: 1. Functional deficits secondary to anoxic brain injury from  renal failure, heart failure, PEA arrest              -patient may not shower due to HD catheter             -ELOS/Goals: 12-14 days- supervision  -Continue CIR- PT, OT and SLP 2.  Antithrombotics: -DVT/anticoagulation:  Pharmaceutical: Lovenox             -antiplatelet therapy: none 3. Pain Management: Tylenol, oxycodone  -appears controlled at present  2/12- pain controlled- denies- con't regimen 4. Mood: LCSW to evaluate and provide emotional support             -antipsychotic agents: n/a 5. Neuropsych: This patient is? (Poor memory) capable of making decisions on his own behalf. 6. Skin/Wound Care: Sacral and Left heel unstageable/ decubitus ulcer wound care:   -air  mattress, appropriate w/c cushion -WOC nurse consult- santyl to sacral wound -foam dressings for feet.              --Eucerin for dry skin  -consider orthotic shoes for  foot wounds, 7. Fluids/Electrolytes/Nutrition: Fluid restriction 1200 mL daily             --per nephrology             --renal/carb modified diet. Continue supplements 8: ESRD on iHD via right IJ TDC on MWF schedule 9: DM 1: Continue Semglee 5 units at bedtime, SSI meal/HS coverage; CBGs- no oral  agents due to type 1 DM  CBG (last 3)  Recent Labs    06/24/21 2113 06/25/21 0604 06/25/21 1224  GLUCAP 132* 185* 119*    2/10 inconsistent control at present--monitor for pattern for now  2/11- not on Semglee right now- will see how CBGs go- might have to restart- increased carbs to 120 carbs- but not higher- and will titrate insulin based on this-   2/12-CBGs 119-185- hasn't changed appreciably with increase in carbs  10: Heart failure: Continue Entresto BID, Coreg 11: Atrial fibrillation: Continue amiodarone BID; ? need for [facility] 12: Hypertension: continue Coreg  2/11- BP controlled- con't regimen 13: Anemia, chronic disease: Is getting iron supplementation  2/10 hgb up to 7.3 today--recheck Monday 14: Tobacco use: provide cessation counseling             --? Nicotine patch 15: GERD: Continue PPI 16: Thrombocytopenia: Continue to monitor platelet count             --No signs of bleeding  2/10 platelets 62k (54 previously) 17: Aspiration pneumonia: treated and resolved   19: Diarrhea: + stool lactoferrin; C.diff was negative                 --colonoscopy with biopsies 2/8             --continued scheduled loperamide for now and continue to monitor BMs   -stools semi-formed right now  2/12- still having loose stools- con't imodium 20. Leukocytosis  2/12- will recheck in AM since a little more elevated Friday evening.    I spent a total of 35   minutes on total care today- >50% coordination of care- due to education of pt about diet needed with renal diet as compared to just DM1 diet. Also speaking with nurse about pt's outside food and 36 oz drink.     LOS: 3 days A FACE TO FACE  EVALUATION WAS PERFORMED  Elinora Weigand 06/25/2021, 2:48 PM

## 2021-06-25 NOTE — Progress Notes (Signed)
[facility] Progress Note  Subjective: seen in room, no c/o's  Vitals:   06/24/21 1719 06/24/21 1930 06/25/21 0448 06/25/21 0737  BP: 122/73 119/76 126/76 (!) 141/96  Pulse: 89 86 84 86  Resp:  16 16   Temp:  98.6 F (37 C) 98.1 F (36.7 C)   TempSrc:  Oral Oral   SpO2:  100% 100%   Weight:   71.2 kg   Height:        Exam: Gen alert, no distress, chronically ill appearing  No jvd or bruits Chest clear bilat to bases RRR no RG Abd soft obese vs possible ascites, ntnd +bs Flank/ lateral abd wall edema 1+ bilat Ext no LE or UE edema, +wounds lower leg Neuro is alert, Ox 3 , nf   OP HD: set up for OP HD on MWF schedule w/ [facility]   Assessment/ Plan: Debility - on CIR sent from [facility] ESRD - new start to HD, 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 Pt rec'd CRRT for 1 week then was transitioned to iHD. Gilliam placed on 2/03. Does not have AVF yet. Consdier VVS consult. Had HD here on Sat.  Will have OP HD on MWF schedule, will switch to MWF schedule. HD tomorrow.    Atrial fib Volume - some flank/ abd wall edema, possibly ascites not sure. Got 2 L off w/ HD Sat, goal 2-2.5 w/ next HD.  Anemia ckd - Hb low in the 7-8 range, tsat was 45%, ordered aranesp 60 mcg weekly on fridays, 1st dose was 2/10.  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   Inpatient medications:  (feeding  supplement) PROSource Plus  30 mL Oral TID BM   sodium chloride   Intravenous Once   amiodarone  200 mg Oral Daily   vitamin C  500 mg Oral BID   calcitRIOL  0.5 mcg Oral Daily   calcium carbonate  1 tablet Oral TID   carvedilol  3.125 mg Oral BID WC   Chlorhexidine Gluconate Cloth  6 each Topical Q0600   cinacalcet  30 mg Oral Q breakfast   collagenase   Topical Daily   darbepoetin (ARANESP) injection - DIALYSIS  60 mcg Intravenous Q Fri-HD   ferrous sulfate  325 mg Oral Q breakfast   hydrocerin   Topical Daily   insulin aspart  0-5 Units Subcutaneous QHS   insulin aspart  0-6 Units Subcutaneous TID WC   lipase/protease/amylase  36,000 Units Oral TID WC   loperamide  2 mg Oral BID   multivitamin  1 tablet Oral QHS   pantoprazole  40 mg Oral Daily   sacubitril-valsartan  1 tablet Oral BID   thiamine  100 mg Oral Daily    acetaminophen, bisacodyl, diphenhydrAMINE, guaiFENesin-dextromethorphan, ipratropium-albuterol, lip balm, methocarbamol, oxyCODONE, polyvinyl alcohol, prochlorperazine **OR** prochlorperazine **OR** prochlorperazine, senna-docusate, traZODone

## 2021-06-26 ENCOUNTER — Other Ambulatory Visit: Payer: Self-pay

## 2021-06-26 ENCOUNTER — Encounter (HOSPITAL_COMMUNITY): Payer: Self-pay | Admitting: Physical Medicine & Rehabilitation

## 2021-06-26 LAB — CBC WITH DIFFERENTIAL/PLATELET
Abs Immature Granulocytes: 0.05 10*3/uL (ref 0.00–0.07)
Basophils Absolute: 0.1 10*3/uL (ref 0.0–0.1)
Basophils Relative: 0 %
Eosinophils Absolute: 0.7 10*3/uL — ABNORMAL HIGH (ref 0.0–0.5)
Eosinophils Relative: 5 %
HCT: 25.9 % — ABNORMAL LOW (ref 39.0–52.0)
Hemoglobin: 8.5 g/dL — ABNORMAL LOW (ref 13.0–17.0)
Immature Granulocytes: 0 %
Lymphocytes Relative: 13 %
Lymphs Abs: 1.7 10*3/uL (ref 0.7–4.0)
MCH: 30.1 pg (ref 26.0–34.0)
MCHC: 32.8 g/dL (ref 30.0–36.0)
MCV: 91.8 fL (ref 80.0–100.0)
Monocytes Absolute: 0.8 10*3/uL (ref 0.1–1.0)
Monocytes Relative: 7 %
Neutro Abs: 9.3 10*3/uL — ABNORMAL HIGH (ref 1.7–7.7)
Neutrophils Relative %: 75 %
Platelets: 64 10*3/uL — ABNORMAL LOW (ref 150–400)
RBC: 2.82 MIL/uL — ABNORMAL LOW (ref 4.22–5.81)
RDW: 14.8 % (ref 11.5–15.5)
WBC: 12.6 10*3/uL — ABNORMAL HIGH (ref 4.0–10.5)
nRBC: 0 % (ref 0.0–0.2)

## 2021-06-26 LAB — RENAL FUNCTION PANEL
Albumin: <1.5 g/dL — ABNORMAL LOW (ref 3.5–5.0)
Anion gap: 9 (ref 5–15)
BUN: 36 mg/dL — ABNORMAL HIGH (ref 6–20)
CO2: 27 mmol/L (ref 22–32)
Calcium: 6.7 mg/dL — ABNORMAL LOW (ref 8.9–10.3)
Chloride: 102 mmol/L (ref 98–111)
Creatinine, Ser: 5.15 mg/dL — ABNORMAL HIGH (ref 0.61–1.24)
GFR, Estimated: 15 mL/min — ABNORMAL LOW (ref >60)
Glucose, Bld: 159 mg/dL — ABNORMAL HIGH (ref 70–99)
Phosphorus: 6.3 mg/dL — ABNORMAL HIGH (ref 2.5–4.6)
Potassium: 5.4 mmol/L — ABNORMAL HIGH (ref 3.5–5.1)
Sodium: 138 mmol/L (ref 135–145)

## 2021-06-26 LAB — GLUCOSE, CAPILLARY
Glucose-Capillary: 150 mg/dL — ABNORMAL HIGH (ref 70–99)
Glucose-Capillary: 176 mg/dL — ABNORMAL HIGH (ref 70–99)
Glucose-Capillary: 128 mg/dL — ABNORMAL HIGH (ref 70–99)

## 2021-06-26 MED ORDER — HEPARIN SODIUM (PORCINE) 1000 UNIT/ML IJ SOLN
INTRAMUSCULAR | Status: AC
  Administered 2021-06-26: 1000 [IU]
  Filled 2021-06-26: qty 4

## 2021-06-26 NOTE — Progress Notes (Signed)
Occupational Therapy Session Note  Patient Details  Name: Jonathan Mcmillan MRN: [id number] Date of Birth: 1995/07/10  Today's Date: 06/26/2021 OT Individual Time: 1030-1100 OT Individual Time Calculation (min): 30 min    Short Term Goals: Week 1:  OT Short Term Goal 1 (Week 1): Pt will be able to don pants with min A or less. OT Short Term Goal 2 (Week 1): Pt will be able to consistently complete sit to stands with mod A or less. OT Short Term Goal 3 (Week 1): Pt will complete squat pivot transfers to toilet with min A. OT Short Term Goal 4 (Week 1): Pt will engage in room HEP for his UEs in between therapy sessions.  Skilled Therapeutic Interventions/Progress Updates:  Patient met lying supine in bed in agreement with short OT treatment session. Mild pain reported in buttocks and BLE with mobility. Supine <> EOB with Min guard to Min A respectively. Patient in agreement with blocked practice for sit to stand transfers x5 from elevated EOB to RW with Mod to Max A. Increased assist required for sit to stand from wc at end of session. Patient then completed functional mobility from hospital room to 4w nurses station (264f) with RW and CGA. Total A for wc transport back to room for time management. Despite next session set to begin immediately after conclusion of session patient insistent on getting back to bed. Session concluded with patient lying supine in bed with call bell within reach, bed alarm activated and all needs met.   Therapy Documentation Precautions:  Precautions Precautions: Fall Precaution Comments: R temporary femoral dialysis catheter removed 06/20/21, low activity tolerance, L lateral heel and foot wound, sacral wound Restrictions Weight Bearing Restrictions: No General:    Therapy/Group: Individual Therapy  Averianna Brugger R Howerton-Davis 06/26/2021, 9:52 AM

## 2021-06-26 NOTE — Progress Notes (Signed)
Nutrition Brief Note  RD consulted for diet education. Pt unavailable at time of visit. Pt currently undergoing hemodialysis treatment. RD to revisit tomorrow and provide diet education.   Corrin Parker, MS, RD, LDN RD pager number/after hours weekend pager number on Amion.

## 2021-06-26 NOTE — Progress Notes (Signed)
Physical Therapy Session Note  Patient Details  Name: Jonathan Mcmillan MRN: [id number] Date of Birth: Apr 01, 1996  Today's Date: 06/26/2021 PT Individual Time: 1101-1200 PT Individual Time Calculation (min): 59 min   Short Term Goals: Week 1:  PT Short Term Goal 1 (Week 1): Pt will perform bed mobility with consistent CGA. PT Short Term Goal 2 (Week 1): Pt will consistently perform standing functional transfers with MinA. PT Short Term Goal 3 (Week 1): Pt will ambulate at least 150 feet consistently with improved heel-to-toe gait pattern. PT Short Term Goal 4 (Week 1): Pt will initiate stair training.  Skilled Therapeutic Interventions/Progress Updates:    Patient received supine in bed, agreeable to PT with encouragement. He reports pain in his feet and bottom, unrated, premedicated. PT providing rest breaks, distractions and repositioning to assist with pain management. Patient frequently asking PT to complete tasks for him that patient has the ability to complete independently, such as putting his feet on the footrests of his chair. PT attempting to encourage and empower patient throughout session to complete more tasks. CGA to transfer to wc with RW. Patient propelling himself in wc to therapy gym with B UE and Max encouragement. Patient then ambulating ~211ft with RW and CGA. He complained of increasing foot pain, so PT offered seated rest break. Upon sitting (with an uncontrolled descent) patient becoming very frustrated with PT that a seated rest break was offered. Patient continuing to ambulate- he demonstrates flat foot contact with genu recurvatum for improved stability due to poor proximal musculature. Patient requesting to play the Wii and was able to do so with no UE support on walker and close supervision. No LOB. Patient ambulating back to wc and propelling himself back to his room. Ambulatory transfer to bed with RW and CGA. Upon laying down, patient repositioning himself in bed and  reporting to PT that he can "do a lot for himself." Patient then requesting that PT put blankets over him even though the blankets were next to him. Bed alarm on, call light within reach.   Therapy Documentation Precautions:  Precautions Precautions: Fall Precaution Comments: R temporary femoral dialysis catheter removed 06/20/21, low activity tolerance, L lateral heel and foot wound, sacral wound Restrictions Weight Bearing Restrictions: No    Therapy/Group: Individual Therapy  Karoline Caldwell, PT, DPT, CBIS  06/26/2021, 7:34 AM

## 2021-06-26 NOTE — Progress Notes (Signed)
Occupational Therapy Session Note  Patient Details  Name: Jonathan Mcmillan MRN: [id number] Date of Birth: 1996/02/24  Today's Date: 06/26/2021 OT Individual Time: 0830-0900 OT Individual Time Calculation (min): 30 min    Short Term Goals: Week 1:  OT Short Term Goal 1 (Week 1): Pt will be able to don pants with min A or less. OT Short Term Goal 2 (Week 1): Pt will be able to consistently complete sit to stands with mod A or less. OT Short Term Goal 3 (Week 1): Pt will complete squat pivot transfers to toilet with min A. OT Short Term Goal 4 (Week 1): Pt will engage in room HEP for his UEs in between therapy sessions.  Skilled Therapeutic Interventions/Progress Updates:    Pt received sitting EOB with RN in room. Pt dressed and ready for therapy. He reports that he already did oral care.  Pt wearing slippers and told pt we needed to put on his nikes to give him more support with standing and ambulation, but pt refused stating slippers were fine. Pointed out slippers not fully covering his heels and pt again stated, I do not need my other shoes on, these are fine. Had pt stand from elevated bed to RW with min A and then he took a few steps to wc with cues to fully step his feet back to wc.  Once in w/c, had pt self propel  to the gym. Noticed that his slippers had slid off his heels and asked pt to fix his slippers but pt complained he could not lift his legs to adjust the heel. Asked pt to try and he got annoyed. Reminded pt that to return to independence he has to put effort in.  I ended up fixing his slipper,  Asked pt to stand up from wc to RW but unable to push up at all and ended up having to do a squat pivot to mat with MOD A and very poor hip clearance.  From edge of mat, worked on LE AROM with heel slides on towels and UE strength using yellow theraband focusing on triceps and shoulders. Pt has very weak musculature and can not do many reps at a time.   Hand off to next therapist.    Therapy Documentation Precautions:  Precautions Precautions: Fall Precaution Comments: R temporary femoral dialysis catheter removed 06/20/21, low activity tolerance, L lateral heel and foot wound, sacral wound Restrictions Weight Bearing Restrictions: No   Pain: Pain Assessment Pain Scale: 0-10 Pain Score: 2  Pain Type: Acute pain Pain Location: Sacrum Pain Orientation: Mid Pain Descriptors / Indicators: Aching Pain Frequency: Intermittent Pain Onset: On-going Pain Intervention(s): Medication (See eMAR) ADL: ADL Eating: Set up Grooming: Setup Upper Body Bathing: Setup Lower Body Bathing: Maximal assistance Upper Body Dressing: Setup Lower Body Dressing: Maximal assistance Toileting: Maximal assistance Toilet Transfer: Maximal assistance Toilet Transfer Method: Squat pivot Toilet Transfer Equipment: Raised toilet seat, Grab bars   Therapy/Group: Individual Therapy  Rake 06/26/2021, 8:24 AM

## 2021-06-26 NOTE — Progress Notes (Signed)
PROGRESS NOTE   Subjective/Complaints: No issues overnite , pt without pain, asked about d/c date   ROS: limited due to cognition/behavior   Objective:   No results found. Recent Labs    06/23/21 1421 06/26/21 0623  WBC 12.1* 12.6*  HGB 6.7* 8.5*  HCT 21.1* 25.9*  PLT 56* 64*    Recent Labs    06/23/21 1421 06/26/21 0623  NA 136 138  K 5.0 5.4*  CL 102 102  CO2 25 27  GLUCOSE 179* 159*  BUN 27* 36*  CREATININE 4.37* 5.15*  CALCIUM 6.2* 6.7*     Intake/Output Summary (Last 24 hours) at 06/26/2021 0815 Last data filed at 06/25/2021 2200 Gross per 24 hour  Intake 720 ml  Output --  Net 720 ml      Pressure Injury 06/01/21 Buttocks Right;Lower Stage 2 -  Partial thickness loss of dermis presenting as a shallow open injury with a red, pink wound bed without slough. (Active)  06/01/21 1030  Location: Buttocks  Location Orientation: Right;Lower  Staging: Stage 2 -  Partial thickness loss of dermis presenting as a shallow open injury with a red, pink wound bed without slough.  Wound Description (Comments):   Present on Admission: Yes    Physical Exam: Vital Signs Blood pressure (!) 141/96, pulse 74, temperature (!) 97.4 F (36.3 C), temperature source Oral, resp. rate 16, height 5\' 8"  (1.727 m), weight 72.3 kg, SpO2 100 %.    General: No acute distress Mood and affect are appropriate Heart: Regular rate and rhythm no rubs murmurs or extra sounds Lungs: Clear to auscultation, breathing unlabored, no rales or wheezes Abdomen: Positive bowel sounds, soft nontender to palpation, nondistended Extremities: No clubbing, cyanosis, or edema Skin: No evidence of breakdown, no evidence of rash   Skin: sacral wound with eschar, exudate, Left foot with abrasions, Right foot with eschar/slough on plantar aspect Neuro:  Pt alert, oriented to self, hospital. Follows basic commands. Normal language, speech low  volume. Fair insight. Good bed mobility. Motor 4/5 in BUE and 5/5 BLE . Senses pain and light touch with sl decreased distally 3/3 immediate and 2 min recall 3 unrelated objects  Musculoskeletal: Full ROM, No pain with AROM or PROM in the neck, trunk, or extremities. Posture appropriate     Assessment/Plan: 1. Functional deficits which require 3+ hours per day of interdisciplinary therapy in a comprehensive inpatient rehab setting. Physiatrist is providing close team supervision and 24 hour management of active medical problems listed below. Physiatrist and rehab team continue to assess barriers to discharge/monitor patient progress toward functional and medical goals  Care Tool:  Bathing    Body parts bathed by patient: Front perineal area, Buttocks   Body parts bathed by helper: Buttocks, Right upper leg, Left upper leg, Right lower leg, Left lower leg     Bathing assist Assist Level: Moderate Assistance - Patient 50 - 74%     Upper Body Dressing/Undressing Upper body dressing   What is the patient wearing?: Pull over shirt    Upper body assist Assist Level: Contact Guard/Touching assist    Lower Body Dressing/Undressing Lower body dressing      What is  the patient wearing?: Pants, Underwear/pull up     Lower body assist Assist for lower body dressing: Moderate Assistance - Patient 50 - 74%     Toileting Toileting    Toileting assist Assist for toileting: 2 Helpers     Transfers Chair/bed transfer  Transfers assist     Chair/bed transfer assist level: Moderate Assistance - Patient 50 - 74%     Locomotion Ambulation   Ambulation assist      Assist level: Minimal Assistance - Patient > 75% Assistive device: Walker-rolling Max distance: 150 ft   Walk 10 feet activity   Assist     Assist level: Minimal Assistance - Patient > 75% Assistive device: Walker-rolling   Walk 50 feet activity   Assist    Assist level: Minimal Assistance - Patient >  75% Assistive device: Walker-rolling    Walk 150 feet activity   Assist    Assist level: Minimal Assistance - Patient > 75% Assistive device: Walker-rolling    Walk 10 feet on uneven surface  activity   Assist Walk 10 feet on uneven surfaces activity did not occur: Safety/medical concerns         Wheelchair     Assist Is the patient using a wheelchair?: Yes Type of Wheelchair: Manual    Wheelchair assist level: Total Assistance - Patient < 25% Max wheelchair distance: 150 ft    Wheelchair 50 feet with 2 turns activity    Assist        Assist Level: Total Assistance - Patient < 25%   Wheelchair 150 feet activity     Assist      Assist Level: Total Assistance - Patient < 25%   Blood pressure (!) 141/96, pulse 74, temperature (!) 97.4 F (36.3 C), temperature source Oral, resp. rate 16, height 5\' 8"  (1.727 m), weight 72.3 kg, SpO2 100 %.  Medical Problem List and Plan: 1. Functional deficits secondary to anoxic brain injury from  renal failure, heart failure, PEA arrest              -patient may not shower due to HD catheter             -ELOS/Goals: 12-14 days- supervision  -Continue CIR- PT, OT and SLP 2.  Antithrombotics: -DVT/anticoagulation:  Pharmaceutical: Lovenox             -antiplatelet therapy: none 3. Pain Management: Tylenol, oxycodone  -appears controlled at present  2/12- pain controlled- denies- con't regimen 4. Mood: LCSW to evaluate and provide emotional support             -antipsychotic agents: n/a 5. Neuropsych: This patient is? (Poor memory) capable of making decisions on his own behalf. 6. Skin/Wound Care: Sacral and Left heel unstageable/ decubitus ulcer wound care:   -air  mattress, appropriate w/c cushion -WOC nurse consult- santyl to sacral wound -foam dressings for feet.              --Eucerin for dry skin  -consider orthotic shoes for foot wounds, 7. Fluids/Electrolytes/Nutrition: Fluid restriction 1200 mL  daily             --per nephrology             --renal/carb modified diet. Continue supplements 8: ESRD on iHD via right IJ TDC on MWF schedule 9: DM 1: Continue Semglee 5 units at bedtime, SSI meal/HS coverage; CBGs- no oral agents due to type 1 DM  CBG (last 3)  Recent Labs  06/25/21 1224 06/25/21 2120 06/26/21 0634  GLUCAP 119* 102* 150*     Controlled 2/13 10: Heart failure: Continue Entresto BID, Coreg 11: Atrial fibrillation: Continue amiodarone BID; ? need for AC 12: Hypertension: continue Coreg   Vitals:   06/25/21 2003 06/26/21 0414  BP: 133/77 (!) 141/96  Pulse: 75 74  Resp: 16 16  Temp: 98.2 F (36.8 C) (!) 97.4 F (36.3 C)  SpO2: 100% 100%   Controlled 2/13 13: Anemia, chronic disease: Is getting iron supplementation  2/10 hgb up to 7.3 today--recheck Monday 14: Tobacco use: provide cessation counseling             --? Nicotine patch 15: GERD: Continue PPI 16: Thrombocytopenia: Continue to monitor platelet count             --No signs of bleeding  2/10 platelets 62k (54 previously) 17: Aspiration pneumonia: treated and resolved   19: Diarrhea: + stool lactoferrin; C.diff was negative                 --colonoscopy with biopsies 2/8             --continued scheduled loperamide for now and continue to monitor BMs   -stools semi-formed right now  2/12- still having loose stools- con't imodium 20. Leukocytosis  2/13 stable at 12.6      LOS: 4 days A FACE TO FACE EVALUATION WAS PERFORMED  Charlett Blake 06/26/2021, 8:15 AM

## 2021-06-26 NOTE — Progress Notes (Signed)
Physical Therapy Session Note  Patient Details  Name: Jonathan Mcmillan MRN: [id number] Date of Birth: March 09, 1996  Today's Date: 06/24/2021 PT Individual Time:  1335-1430  PT Individual Time Calculation (min): 55 min  Short Term Goals: Week 1:  PT Short Term Goal 1 (Week 1): Pt will perform bed mobility with consistent CGA. PT Short Term Goal 2 (Week 1): Pt will consistently perform standing functional transfers with MinA. PT Short Term Goal 3 (Week 1): Pt will ambulate at least 150 feet consistently with improved heel-to-toe gait pattern. PT Short Term Goal 4 (Week 1): Pt will initiate stair training.  Skilled Therapeutic Interventions/Progress Updates:  Patient supine in bed asleep on entrance to room. Patient initially not receptive to therapy and requesting to continue to sleep despite education provided re: pt need for continuous activity to progress and for continued stay in IP rehab unit. Therapist leaves pt supine in bed with alarm on.   RN called to pt's room and finds therapist to inform that pt has changed his mind and wants to participate in therapy so that he can continue to progress.  He is alert and agreeable to PT session.   Patient with no pain complaint throughout session.  Therapeutic Activity: Bed Mobility: Patient performed supine <> sit with supervision/ CGA. VC/ tc required for technique and effort. Transfers: Patient performed sit<>stand and stand pivot transfers throughout session with MinA required for power up. Provided verbal cues for improving technique and BLE muscle activation with push-to-stand technique.  Gait Training:  Patient ambulated 215' x1 and then >300' x1 using RW with CGA/ MinA. Pt demos uncontrolled genu recurvatum throughout. Along with slow pace and decreased step height/ length.  Provided vc/ tc for quality of gait.  Neuromuscular Re-ed: NMR facilitated during session with focus on standing balance and sit<>stand performance. Pt guided in NDT  cueing for increasing forward lean in order to improve weight shift over feet. NMR performed for improvements in motor control and coordination, balance, sequencing, judgement, and self confidence/ efficacy in performing all aspects of mobility at highest level of independence.   Therapeutic Exercise: Pt guided in continuous reciprocation of BLE only using NuStep L3 x 8 min overall with focus on 2 min intervals of full extensor push at L3, then focus on smooth reciprocation without bottoming out and increase in pace at L2. Interval performed again without rest break.   Patient seated EOB at end of session with brakes locked, bed alarm set, and all needs within reach. Aunt arrives with bag of items for pt.      Therapy Documentation Precautions:  Precautions Precautions: Fall Precaution Comments: R temporary femoral dialysis catheter removed 06/20/21, low activity tolerance, L lateral heel and foot wound, sacral wound Restrictions Weight Bearing Restrictions: No  Pain: No pain complaint this session.  Therapy/Group: Individual Therapy  [name] Simons PT, DPT 06/26/2021, 4:48 AM

## 2021-06-26 NOTE — Progress Notes (Signed)
Pt has been accepted at [facility] on a MWF schedule at d/c. Arrangements were made prior to pt's tx to CIR at [facility]. Pt's chair time is 11:45 and pt will need to arrive at 11:00 for first appt to complete paperwork. Info provided to CSW. Will advise clinic of pt's d/c date once known. Will assist as needed.   Melven Sartorius Renal Navigator [phone]

## 2021-06-26 NOTE — Progress Notes (Addendum)
[facility] Progress Note  Subjective: seen in room, no c/o's  Vitals:   06/25/21 1337 06/25/21 1735 06/25/21 2003 06/26/21 0414  BP: 128/76 (!) 144/88 133/77 (!) 141/96  Pulse: 81 79 75 74  Resp: 16  16 16   Temp: 97.6 F (36.4 C)  98.2 F (36.8 C) (!) 97.4 F (36.3 C)  TempSrc:   Oral Oral  SpO2: 100%  100% 100%  Weight:    72.3 kg  Height:        Exam: Gen alert, no distress, chronically ill appearing  No jvd or bruits Chest clear bilat to bases RRR no RG Abd soft obese vs possible ascites, ntnd +bs Flank/ lateral abd wall edema 1+ bilat Ext: trace pedal edema, no UE edema, +wounds lower leg Neuro: awake, alert, moves all ext spontaneosuly, speech clear and coherent Dialysis access: RIJ [facility] c/d/i    Recent Labs  Lab 06/23/21 1421 06/26/21 0623  K 5.0 5.4*  BUN 27* 36*  CREATININE 4.37* 5.15*  ALBUMIN <1.5* <1.5*  CALCIUM 6.2* 6.7*  PHOS 6.2* 6.3*  HGB 6.7* 8.5*   Inpatient medications:  (feeding supplement) PROSource Plus  30 mL Oral TID BM   sodium chloride   Intravenous Once   amiodarone  200 mg Oral Daily   vitamin C  500 mg Oral BID   calcitRIOL  0.5 mcg Oral Daily   calcium carbonate  1 tablet Oral TID   carvedilol  3.125 mg Oral BID WC   Chlorhexidine Gluconate Cloth  6 each Topical Q0600   cinacalcet  30 mg Oral Q breakfast   collagenase   Topical Daily   darbepoetin (ARANESP) injection - DIALYSIS  60 mcg Intravenous Q Fri-HD   ferrous sulfate  325 mg Oral Q breakfast   hydrocerin   Topical Daily   insulin aspart  0-5 Units Subcutaneous QHS   insulin aspart  0-6 Units Subcutaneous TID WC   lipase/protease/amylase  36,000 Units Oral TID WC   loperamide  2 mg Oral BID   multivitamin  1 tablet Oral QHS   pantoprazole  40 mg Oral Daily   sacubitril-valsartan  1 tablet Oral BID   thiamine  100 mg Oral Daily    acetaminophen, bisacodyl, diphenhydrAMINE, guaiFENesin-dextromethorphan, ipratropium-albuterol, lip balm, methocarbamol,  oxyCODONE, polyvinyl alcohol, prochlorperazine **OR** prochlorperazine **OR** prochlorperazine, senna-docusate, traZODone   OP HD: set up for OP HD on MWF schedule w/ [facility]   Assessment/ Plan: Debility - In CIR sent from [facility] ESRD - new start to HD, 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 Pt rec'd CRRT for 1 week then was transitioned to iHD. [place] placed on 2/03. Does not have AVF yet. Had HD here on Sat.  Will have OP HD on MWF schedule, will maintain MWF schedule. HD today. Considering VVS consult for permanent access but this will be dependent on discharge plan. Atrial fib-per primary Volume - some flank/ abd wall edema, possibly ascites not sure. Got 2 L off w/ HD Sat, goal 2-2.5 w/ next HD today Anemia ckd - Hb low in the 7-8 range, tsat was 45%, ordered aranesp 60 mcg weekly on fridays, 1st dose was 2/10.  Transfuse prn MBD ckd - Ca in range w/ alb correction, phos up to 6.3, low phos diet Anoxic brain injury SP PEA arrest - in hospital, shortly after admitted Heart failure - getting entresto and coreg  DM1 - per pmd Asp PNA -  sp abx   Gean Quint, MD [facility]

## 2021-06-26 NOTE — Progress Notes (Signed)
Occupational Therapy Session Note  Patient Details  Name: Jonathan Mcmillan MRN: [id number] Date of Birth: [birth year]  Today's Date: 06/26/2021 OT Individual Time: 1601-0932 OT Individual Time Calculation (min): 56 min    Short Term Goals: Week 1:  OT Short Term Goal 1 (Week 1): Pt will be able to don pants with min A or less. OT Short Term Goal 2 (Week 1): Pt will be able to consistently complete sit to stands with mod A or less. OT Short Term Goal 3 (Week 1): Pt will complete squat pivot transfers to toilet with min A. OT Short Term Goal 4 (Week 1): Pt will engage in room HEP for his UEs in between therapy sessions.  Skilled Therapeutic Interventions/Progress Updates:  Pt greeted on EOM in gym with OTR present. pt agreeable to OT intervention. Session focus on increasing overall endurance/ activity tolerance/ strength  as well as decreasing overall caregiver burden.  Pt completed below BUE therex to increase BUE strength and endurance for higher level functional mobility tasks: X20 modified tricep dips X20 triceps rows with level 2 theraband 4x10 push ups with blocks,pt able to get minimal lift but good form and weight shift noted. X3 sit<>stands from EOM with RW; MOD A to power into standing with MOD cues for sequencing and body mechanics. Pt able to stand for 1 min for standing marches during second stand with CGA.  Pt completed stand pivot to w/c with rw and MIN A. Pt transported to additional gym to work on BUE strength and endurance for improved activity tolerance for ADL participation on Sci[employer] with pt completing a total A 8 mins on level 6.  SpO2 99% on RA HR 80 bpm post activity.     Pt completed therapeutic activity of tapping ball back and forth with 4lb dowel rod with OTA, pt completed therapeutic activity for 4 mins with VSS.      Pt completed below therex with 4lb dowel rod: X20 bicepscurls X20 chest presses X20 OH presses, however pt with difficulty achieving full OH ROM  needing to modify to half ROM.   Pt transported back to room with total A where pt completed stand pivot transfer back to EOB with MOD A to stand and MIN A to pivot with RW, MOD A to elevate BLEs back to bed.         pt left supine in bed with bed alarm acitvated and all needs within reach.                      Therapy Documentation Precautions:  Precautions Precautions: Fall Precaution Comments: R temporary femoral dialysis catheter removed 06/20/21, low activity tolerance, L lateral heel and foot wound, sacral wound Restrictions Weight Bearing Restrictions: No  Pain: no pain reported during session     Therapy/Group: Individual Therapy  Corinne Ports [facility] 06/26/2021, 10:23 AM

## 2021-06-26 NOTE — Plan of Care (Signed)
°  Problem: RH Balance Goal: LTG Patient will maintain dynamic standing balance (PT) Description: LTG:  Patient will maintain dynamic standing balance with assistance during mobility activities (PT) Flowsheets (Taken 06/23/2021 1629) LTG: Pt will maintain dynamic standing balance during mobility activities with:: Supervision/Verbal cueing   Problem: Sit to Stand Goal: LTG:  Patient will perform sit to stand with assistance level (PT) Description: LTG:  Patient will perform sit to stand with assistance level (PT) Flowsheets (Taken 06/23/2021 1629) LTG: PT will perform sit to stand in preparation for functional mobility with assistance level: Contact Guard/Touching assist   Problem: RH Bed to Chair Transfers Goal: LTG Patient will perform bed/chair transfers w/assist (PT) Description: LTG: Patient will perform bed to chair transfers with assistance (PT). Flowsheets (Taken 06/23/2021 1629) LTG: Pt will perform Bed to Chair Transfers with assistance level: Contact Guard/Touching assist   Problem: RH Car Transfers Goal: LTG Patient will perform car transfers with assist (PT) Description: LTG: Patient will perform car transfers with assistance (PT). Flowsheets (Taken 06/23/2021 1629) LTG: Pt will perform car transfers with assist:: Contact Guard/Touching assist   Problem: RH Furniture Transfers Goal: LTG Patient will perform furniture transfers w/assist (OT/PT) Description: LTG: Patient will perform furniture transfers  with assistance (OT/PT). Flowsheets (Taken 06/23/2021 1629) LTG: Pt will perform furniture transfers with assist:: Contact Guard/Touching assist   Problem: RH Ambulation Goal: LTG Patient will ambulate in controlled environment (PT) Description: LTG: Patient will ambulate in a controlled environment, # of feet with assistance (PT). Flowsheets (Taken 06/23/2021 1629) LTG: Pt will ambulate in controlled environ  assist needed:: Contact Guard/Touching assist LTG: Ambulation  distance in controlled environment: up to 300 ft using LRAD Goal: LTG Patient will ambulate in home environment (PT) Description: LTG: Patient will ambulate in home environment, # of feet with assistance (PT). Flowsheets (Taken 06/23/2021 1629) LTG: Pt will ambulate in home environ  assist needed:: Supervision/Verbal cueing LTG: Ambulation distance in home environment: up to 50 ft using LRAD   Problem: RH Stairs Goal: LTG Patient will ambulate up and down stairs w/assist (PT) Description: LTG: Patient will ambulate up and down # of stairs with assistance (PT) Flowsheets (Taken 06/23/2021 1629) LTG: Pt will ambulate up/down stairs assist needed:: Contact Guard/Touching assist LTG: Pt will  ambulate up and down number of stairs: at least 4 steps using HR as available in order to mobilize in community

## 2021-06-26 NOTE — Progress Notes (Signed)
Patient struggling to once again adapt to diabetic diet along with fluid restriction. Asked him to tell his family for now not to bring in outside food. He is thirsty at nighttime and tends to not drink during the day so he can at night. Says he feels hungry a lot and does not eat green beans or carrots. Spoke with Cecilie Lowers, [place] and she is planning to see him today.

## 2021-06-27 LAB — BPAM RBC
Blood Product Expiration Date: 202302222359
Blood Product Expiration Date: 202302232359
ISSUE DATE / TIME: 202302101633
Unit Type and Rh: 7300
Unit Type and Rh: 7300

## 2021-06-27 LAB — GLUCOSE, CAPILLARY
Glucose-Capillary: 140 mg/dL — ABNORMAL HIGH (ref 70–99)
Glucose-Capillary: 122 mg/dL — ABNORMAL HIGH (ref 70–99)
Glucose-Capillary: 127 mg/dL — ABNORMAL HIGH (ref 70–99)
Glucose-Capillary: 173 mg/dL — ABNORMAL HIGH (ref 70–99)
Glucose-Capillary: 134 mg/dL — ABNORMAL HIGH (ref 70–99)
Glucose-Capillary: 91 mg/dL (ref 70–99)
Glucose-Capillary: 160 mg/dL — ABNORMAL HIGH (ref 70–99)
Glucose-Capillary: 141 mg/dL — ABNORMAL HIGH (ref 70–99)
Glucose-Capillary: 79 mg/dL (ref 70–99)
Glucose-Capillary: 169 mg/dL — ABNORMAL HIGH (ref 70–99)

## 2021-06-27 LAB — TYPE AND SCREEN
ABO/RH(D): B POS
Antibody Screen: NEGATIVE
Unit division: 0
Unit division: 0

## 2021-06-27 NOTE — Progress Notes (Signed)
Upon returning to the unit, found patient and aunt arguing in the hallway over his wanting to order a [employer] sandwich despite his renal diet. This Rn contacted Dr. Letta Pate who said it WAS ok for patient to have the sandwich. At this time, patient has been instructed that he is not to have outside food/drink d/t his renal dietary needs not being properly met. Dietician also paged and stated she ordered snacks for patient that are yet to come but would stop by and see that patient again. Patient made aware.

## 2021-06-27 NOTE — Progress Notes (Signed)
Patient remains alert and oriented. Did not sleep through the night. Had pain meds round the clock Q4 prn per MD order. Denies nausea. Safety maintained at all times.

## 2021-06-27 NOTE — Progress Notes (Signed)
[facility] Progress Note  Subjective: seen in room, still having some swelling in his feet. Tolerated HD yesterday with net uf 2300cc  Vitals:   06/26/21 1854 06/26/21 2008 06/27/21 0254 06/27/21 0500  BP: 117/72 125/75 114/89   Pulse: 79 89 80   Resp: 16 19 18    Temp: 98.8 F (37.1 C) 98.4 F (36.9 C) 98.4 F (36.9 C)   TempSrc:      SpO2: 100% 100% 100%   Weight:    74 kg  Height:        Exam: Gen: alert, no distress, chronically ill appearing  Chest: cta bl Cardiac: RRR Abd :soft, nt +bs, Flank/ lateral abd wall edema Ext: pedal edema, no UE edema, +wounds lower leg Neuro: awake, alert, moves all ext spontaneosuly, speech clear and coherent Dialysis access: R[facility] c/d/i    Recent Labs  Lab 06/23/21 1421 06/26/21 0623  K 5.0 5.4*  BUN 27* 36*  CREATININE 4.37* 5.15*  ALBUMIN <1.5* <1.5*  CALCIUM 6.2* 6.7*  PHOS 6.2* 6.3*  HGB 6.7* 8.5*   Inpatient medications:  (feeding supplement) PROSource Plus  30 mL Oral TID BM   sodium chloride   Intravenous Once   amiodarone  200 mg Oral Daily   vitamin C  500 mg Oral BID   calcitRIOL  0.5 mcg Oral Daily   calcium carbonate  1 tablet Oral TID   carvedilol  3.125 mg Oral BID WC   Chlorhexidine Gluconate Cloth  6 each Topical Q0600   cinacalcet  30 mg Oral Q breakfast   collagenase   Topical Daily   darbepoetin (ARANESP) injection - DIALYSIS  60 mcg Intravenous Q Fri-HD   ferrous sulfate  325 mg Oral Q breakfast   hydrocerin   Topical Daily   insulin aspart  0-5 Units Subcutaneous QHS   insulin aspart  0-6 Units Subcutaneous TID WC   lipase/protease/amylase  36,000 Units Oral TID WC   loperamide  2 mg Oral BID   multivitamin  1 tablet Oral QHS   pantoprazole  40 mg Oral Daily   sacubitril-valsartan  1 tablet Oral BID   thiamine  100 mg Oral Daily    acetaminophen, bisacodyl, diphenhydrAMINE, guaiFENesin-dextromethorphan, ipratropium-albuterol, lip balm, methocarbamol, oxyCODONE, polyvinyl  alcohol, prochlorperazine **OR** prochlorperazine **OR** prochlorperazine, senna-docusate, traZODone   OP HD: set up for OP HD on MWF schedule w/ [facility]   Assessment/ Plan: Debility - In CIR sent from [facility] ESRD - new start to HD, 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 Pt rec'd CRRT for 1 week then was transitioned to iHD. [place] placed on 2/03. Does not have AVF yet. Considering VVS consult for permanent access but this will be dependent on discharge plan/LOS (meeting today). CLIP: CLIP: [facility] to [facility] MWF 11:45am chair. Next HD 2/15 Atrial fib-per primary Volume - some abd wall edema and pedal edema, will UF as tolerated Anemia ckd - Hb low in the 7-8 range, tsat was 45%, ordered aranesp 60 mcg weekly on fridays, 1st dose was 2/10.  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

## 2021-06-27 NOTE — Progress Notes (Signed)
Physical Therapy Session Note  Patient Details  Name: Jonathan Mcmillan MRN: [id number] Date of Birth: 01/04/1996  Today's Date: 06/27/2021 PT Individual Time: 1000-1057 PT Individual Time Calculation (min): 57 min   Short Term Goals: Week 1:  PT Short Term Goal 1 (Week 1): Pt will perform bed mobility with consistent CGA. PT Short Term Goal 2 (Week 1): Pt will consistently perform standing functional transfers with MinA. PT Short Term Goal 3 (Week 1): Pt will ambulate at least 150 feet consistently with improved heel-to-toe gait pattern. PT Short Term Goal 4 (Week 1): Pt will initiate stair training.  Skilled Therapeutic Interventions/Progress Updates:      Pt supine in bed to start. Pt required convincing to participate as he was "sleepy" from his morning medications. Ultimately he was agreeable, completing supine<>sit with supervision with hosiptal bed features. Sit's unsupported at EOB with SBA. Pt requesting for EOB to be raised - completed sit<>stand from raised EOB height to RW and stand<>pivot transfer with RW and CGA.   Pt transportd in w/c to main rehab gym for time.   Instructed in 6MWT - completed with RW and covered 476ft without standing rest break.   Pt then instructed in TUG - completed x2 trials with RW and CGA, averaged 29s (30s, 28s). Scores >13.5 seconds indicate increased falls risk.  Pt requiring min/modA for powering to rise from w/c height during transfers. Stand<>pivot transfer with RW to mat table. Focused remainder of session on BLE strengthening and improving activity tolerance.  4x5 mini-squats, promoting knee flexion and quad activation - CGA for 4 reps, minA for last rep due to fatigue.  Standing there-ex in // bars with 2.5# ankle weights bilaterally. 2x30 hip marches, 2x20 hamstring curls, 2x20 hip abduction - CGA for safety.  Ambulated back to his room, ~157ft, with CGA and RW with 2.5# ankle weights attached to promote strengthening and endurance.    Pt completed sit>supine with minA for BLE management. Able to scoot himself up in bed with supervision and use of bed rail, relying heavily on UE strength.   Remained supine in bed with bed alarm on, all needs in reach.  *of note, pt emotionally frustrated with staff as he reporting "people keep saying i'm mean." Encouraged therapeutic listening and encouragement during session to improve functional indep and regain strength. Pt responded well to direct cueing.   Therapy Documentation Precautions:  Precautions Precautions: Fall Precaution Comments: R temporary femoral dialysis catheter removed 06/20/21, low activity tolerance, L lateral heel and foot wound, sacral wound Restrictions Weight Bearing Restrictions: No General:    Therapy/Group: Individual Therapy  [name] Simons 06/27/2021, 7:36 AM

## 2021-06-27 NOTE — Progress Notes (Signed)
PROGRESS NOTE   Subjective/Complaints:  No issues overnite , discussed RD consult, conf day   ROS: limited due to cognition/behavior   Objective:   No results found. Recent Labs    06/26/21 0623  WBC 12.6*  HGB 8.5*  HCT 25.9*  PLT 64*    Recent Labs    06/26/21 0623  NA 138  K 5.4*  CL 102  CO2 27  GLUCOSE 159*  BUN 36*  CREATININE 5.15*  CALCIUM 6.7*     Intake/Output Summary (Last 24 hours) at 06/27/2021 0739 Last data filed at 06/26/2021 1839 Gross per 24 hour  Intake 358 ml  Output 2300 ml  Net -1942 ml      Pressure Injury 06/01/21 Buttocks Right;Lower Stage 2 -  Partial thickness loss of dermis presenting as a shallow open injury with a red, pink wound bed without slough. (Active)  06/01/21 1030  Location: Buttocks  Location Orientation: Right;Lower  Staging: Stage 2 -  Partial thickness loss of dermis presenting as a shallow open injury with a red, pink wound bed without slough.  Wound Description (Comments):   Present on Admission: Yes    Physical Exam: Vital Signs Blood pressure 114/89, pulse 80, temperature 98.4 F (36.9 C), resp. rate 18, height _0  (1.727 m), weight 74 kg, SpO2 100 %.    General: No acute distress Mood and affect are appropriate Heart: Regular rate and rhythm no rubs murmurs or extra sounds Lungs: Clear to auscultation, breathing unlabored, no rales or wheezes Abdomen: Positive bowel sounds, soft nontender to palpation, nondistended Extremities: No clubbing, cyanosis, or edema Skin: No evidence of breakdown, no evidence of rash   Skin: sacral wound with eschar, exudate, Left foot with abrasions, Right foot with eschar/slough on plantar aspect Neuro:  Pt alert, oriented to self, hospital. Follows basic commands. Normal language, speech low volume. Fair insight. Good bed mobility. Motor 4/5 in BUE and 5/5 BLE . Senses pain and light touch with sl decreased  distally 3/3 immediate and 2 min recall 3 unrelated objects  Musculoskeletal: Full ROM, No pain with AROM or PROM in the neck, trunk, or extremities. Posture appropriate     Assessment/Plan: 1. Functional deficits which require 3+ hours per day of interdisciplinary therapy in a comprehensive inpatient rehab setting. Physiatrist is providing close team supervision and 24 hour management of active medical problems listed below. Physiatrist and rehab team continue to assess barriers to discharge/monitor patient progress toward functional and medical goals  Care Tool:  Bathing    Body parts bathed by patient: Front perineal area, Buttocks   Body parts bathed by helper: Buttocks, Right upper leg, Left upper leg, Right lower leg, Left lower leg     Bathing assist Assist Level: Moderate Assistance - Patient 50 - 74%     Upper Body Dressing/Undressing Upper body dressing   What is the patient wearing?: Pull over shirt    Upper body assist Assist Level: Contact Guard/Touching assist    Lower Body Dressing/Undressing Lower body dressing      What is the patient wearing?: Pants, Underwear/pull up     Lower body assist Assist for lower body dressing: Moderate Assistance -  Patient 50 - 74%     Chartered loss adjuster assist Assist for toileting: 2 Helpers     Transfers Chair/bed transfer  Transfers assist     Chair/bed transfer assist level: Minimal Assistance - Patient > 75%     Locomotion Ambulation   Ambulation assist      Assist level: Minimal Assistance - Patient > 75% Assistive device: Walker-rolling Max distance: 150 ft   Walk 10 feet activity   Assist     Assist level: Minimal Assistance - Patient > 75% Assistive device: Walker-rolling   Walk 50 feet activity   Assist    Assist level: Minimal Assistance - Patient > 75% Assistive device: Walker-rolling    Walk 150 feet activity   Assist    Assist level: Minimal Assistance -  Patient > 75% Assistive device: Walker-rolling    Walk 10 feet on uneven surface  activity   Assist Walk 10 feet on uneven surfaces activity did not occur: Safety/medical concerns         Wheelchair     Assist Is the patient using a wheelchair?: Yes Type of Wheelchair: Manual    Wheelchair assist level: Total Assistance - Patient < 25% Max wheelchair distance: 150 ft    Wheelchair 50 feet with 2 turns activity    Assist        Assist Level: Total Assistance - Patient < 25%   Wheelchair 150 feet activity     Assist      Assist Level: Total Assistance - Patient < 25%   Blood pressure 114/89, pulse 80, temperature 98.4 F (36.9 C), resp. rate 18, height _0  (1.727 m), weight 74 kg, SpO2 100 %.  Medical Problem List and Plan: 1. Functional deficits secondary to anoxic brain injury from  renal failure, heart failure, PEA arrest              -patient may not shower due to HD catheter             -ELOS/Goals: 12-14 days- supervision  -Team conference today please see physician documentation under team conference tab, met with team  to discuss problems,progress, and goals. Formulized individual treatment plan based on medical history, underlying problem and comorbidities.  2.  Antithrombotics: -DVT/anticoagulation:  Pharmaceutical: Lovenox             -antiplatelet therapy: none 3. Pain Management: Tylenol, oxycodone  -appears controlled at present  2/12- pain controlled- denies- con't regimen 4. Mood: LCSW to evaluate and provide emotional support             -antipsychotic agents: n/a 5. Neuropsych: This patient is? (Poor memory) capable of making decisions on his own behalf. 6. Skin/Wound Care: Sacral and Left heel unstageable/ decubitus ulcer wound care:   -air  mattress, appropriate w/c cushion -WOC nurse consult- santyl to sacral wound -foam dressings for feet.              --Eucerin for dry skin  -consider orthotic shoes for foot wounds, 7.  Fluids/Electrolytes/Nutrition: Fluid restriction 1200 mL daily             --per nephrology             --renal/carb modified diet. Continue supplements 8: ESRD on iHD via right IJ TDC on MWF schedule 9: DM 1: Continue Semglee 5 units at bedtime, SSI meal/HS coverage; CBGs- no oral agents due to type 1 DM  CBG (last 3)  Recent Labs  06/26/21 1205 06/26/21 2058 06/27/21 0556  GLUCAP 176* 128* 160*     Controlled 2/14 10: Heart failure: Continue Entresto BID, Coreg- L>R pedal edema should improve with HD, may need compression hose  11: Atrial fibrillation: Continue amiodarone BID; ? need for AC 12: Hypertension: continue Coreg   Vitals:   06/26/21 2008 06/27/21 0254  BP: 125/75 114/89  Pulse: 89 80  Resp: 19 18  Temp: 98.4 F (36.9 C) 98.4 F (36.9 C)  SpO2: 100% 100%   Controlled 2/14 13: Anemia, chronic disease: Is getting iron supplementation  2/10 hgb up to 7.3 today--recheck Monday 14: Tobacco use: provide cessation counseling             --? Nicotine patch 15: GERD: Continue PPI 16: Thrombocytopenia: Continue to monitor platelet count             --No signs of bleeding  2/10 platelets 62k (54 previously) 17: Aspiration pneumonia: treated and resolved   19: Diarrhea: + stool lactoferrin; C.diff was negative                 --colonoscopy with biopsies 2/8             --continued scheduled loperamide for now and continue to monitor BMs   -stools semi-formed right now  2/12- still having loose stools- con't imodium 20. Leukocytosis  2/13 stable at 12.6      LOS: 5 days A FACE TO FACE EVALUATION WAS PERFORMED  Charlett Blake 06/27/2021, 7:39 AM

## 2021-06-27 NOTE — Progress Notes (Signed)
Initial Nutrition Assessment  DOCUMENTATION CODES:   Non-severe (moderate) malnutrition in context of chronic illness  INTERVENTION:  Continue 30 ml Prosource plus po TID, each supplement provides 100 kcal and 15 grams of protein.   Snacks ordered by RD. To be delivered by kitchen.   Renal diet education given.   NUTRITION DIAGNOSIS:   Moderate Malnutrition related to chronic illness (renal failure on HD) as evidenced by mild fat depletion, mild muscle depletion.  GOAL:   Patient will meet greater than or equal to 90% of their needs  MONITOR:   Supplement acceptance, Labs, Weight trends, I & O's, Skin  REASON FOR ASSESSMENT:   Consult Diet education  ASSESSMENT:   26 year old male who initially presented with fever, chills and vomiting.  Initial work-up revealed profound anemia and significantly elevated serum creatinine above baseline level. PMH includes CKD stage IV, hypertension, diabetes mellitus type 1, GERD. Pt with PEA arrest during hospitalization. 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  RD consulted for renal diet education. Provided handouts on low potassium and phosphorous foods. Explained why diet restrictions are needed and provided lists of foods to limit/avoid that are high potassium, sodium, and phosphorus. Provided specific recommendations on safer alternatives of these foods. Strongly encouraged compliance of this diet. Renal friendly drink options discussed. Pt reports some motivation to make dietary changes upon discharge home. Pt reports mostly eating out and recommended to consume more home cooked meals at home. Pt to speak with Aunts on recommendation for increasing home cooked meals for better compliance of renal diet.   Spoke with PA yesterday, current plans to not allow food from outside at this time as pt currently on strict renal/carb mod diet while in  inpatient rehab. Pt reports frequent hunger. RD to order snacks in between meals in compliance with renal/carb mod diet. Snacks to be brought up from kitchen.   NUTRITION - FOCUSED PHYSICAL EXAM:  Flowsheet Row Most Recent Value  Orbital Region No depletion  Upper Arm Region Moderate depletion  Thoracic and Lumbar Region Mild depletion  Buccal Region No depletion  Temple Region No depletion  Clavicle Bone Region Mild depletion  Clavicle and Acromion Bone Region Mild depletion  Scapular Bone Region Unable to assess  Dorsal Hand Unable to assess  Patellar Region Unable to assess  [edema]  Anterior Thigh Region Unable to assess  [edema]  Posterior Calf Region Unable to assess  [edema]  Edema (RD Assessment) Mild  Hair Reviewed  Eyes Reviewed  Mouth Reviewed  Skin Reviewed  Nails Reviewed      Labs and medications reviewed. Potassium elevated at 5.4. Phosphorous elevated at 6.3.  Diet Order:   Diet Order             Diet renal/carb modified with fluid restriction Diet-HS Snack? Nothing; Fluid restriction: 1200 mL Fluid; Room service appropriate? Yes; Fluid consistency: Thin  Diet effective now                   EDUCATION NEEDS:   Education needs have been addressed  Skin:  Skin Assessment: Skin Integrity Issues: Skin Integrity Issues:: Stage II Stage II: buttocks  Last BM:  2/13  Height:   Ht Readings from Last 1 Encounters:  06/22/21 5\' 8"  (1.727 m)    Weight:   Wt Readings from Last 1 Encounters:  06/27/21 74 kg    BMI:  Body mass index  is 24.81 kg/m.  Estimated Nutritional Needs:   Kcal:  2200-2400  Protein:  110-120grams  Fluid:  1000 ml + UOP  Corrin Parker, MS, RD, LDN RD pager number/after hours weekend pager number on Amion.

## 2021-06-27 NOTE — Progress Notes (Signed)
Patient ID: Jonathan Mcmillan, male   DOB: [birth year], 26 y.o.   MRN: [id number]  Met with pt and his aunt-Tammy was here also to update regarding team conference goals of CGA level and target discharge date of 2/21. Discussed coming in for education and she said no, she did not feel the need. She reports he can get up and move if he wants too and if not he can stay in the bed. She is aware of his lack of participation at times and behaviors. She feels you just need to lay down the law with him and tell him no. She asked to speak to dietician while here due to pt was insisting he could have subway. Bedside RN was getting dietician to speak with both pt and aunt together. Gave aunt paper disability form and how to go on-line and apply for disability for pt. Continue to work on discharge needs for discharge next week.

## 2021-06-27 NOTE — Patient Care Conference (Signed)
Inpatient RehabilitationTeam Conference and Plan of Care Update Date: 06/27/2021   Time: 12:14 PM    Patient Name: Jonathan Mcmillan      Medical Record Number: [id number]  Date of Birth: [birth year] Sex: Male         Room/Bed: 4M05C/4M05C-01 Payor Info: Payor: /    Admit Date/Time:  06/22/2021  2:04 PM  Primary Diagnosis:  Anoxic encephalopathy [facility])  Hospital Problems: Principal Problem:   Anoxic encephalopathy ([place]) Active Problems:   Type 1 diabetes mellitus with complication ([place])   ESRD on dialysis [facility])   Debility    Expected Discharge Date: Expected Discharge Date: 07/04/21  Team Members Present: Physician leading conference: Dr. Courtney Heys Social Worker Present: Ovidio Kin, LCSW Nurse Present: Dorien Chihuahua, RN PT Present: Ginnie Smart, PT OT Present: Meriel Pica, OT SLP Present: Weston Anna, SLP PPS Coordinator present : Gunnar Fusi, SLP     Current Status/Progress Goal Weekly Team Focus  Bowel/Bladder     Incontinent at [facility]   Continent of bowel;    Toileting  Swallow/Nutrition/ Hydration             ADL's   mod A overall - level of function varies dramatically depending on his fatigue level  CGA to min A overall to support pt when fatigued  ADL training, functional mobility, strengthening, pt education   Mobility   CGA bed mobility, CGA-> MaxA for STS depending on height of surface and energy put forth, amb >266ft with RW and light CGA, haven't trialed steps yet- mainly due to poor buy in from patient, spv wc mobility  CGA  participation, gait progressions, dynamic balance, sit <> stand, stairs, pt/fam ed   Communication             Safety/Cognition/ Behavioral Observations  Supervision A, likely at baseline but 1-2x with ST for safety awareness and novel medication management  Mod I  pill organizer, safety/anticipatory awareness and educate prior to d/c   Pain     Pain in feet, requires prn q 4  hours with minimal relief   Pain managed with  prn meds   Assess need for and effectiveness of prn medications  Skin     Sacral wound and right heel wound care   Skin healilng,patient able to direct assist with wound care   Wound care, special shoes recommended at DC, skin care educ.    Discharge Planning:  Home with aunt and other fmaily members will have 24/7 care. Needs to be able to do steps into Aunt's home   Team Discussion: Patient with loose stools, refusing some medications. CBGs stable. Non compliant with renal diet and fluid restrictions; HD M-W-F. Personality quirks and behavioral issues limit progression; SLP eval noted mild cognitive deficits post hypoxic episode however appears at baseline.  Patient on target to meet rehab goals: Currently able to ambulate up to 445' on 6 minute tests however unable to maintain functional gains due to fatigue.  *See Care Plan and progress notes for long and short-term goals.   Revisions to Treatment Plan:  Dietician consult Diabetic shoes recommended at discharge   Teaching Needs: Safety, medications, dietary modifications, secondary risk management, transfers, toileting, etc.  Current Barriers to Discharge: Decreased caregiver support, Hemodialysis, and Behavior  Possible Resolutions to Barriers: Family education     Medical Summary Current Status: No signs of agitation, blood sugar control is good, difficulty with dietary compliance  Barriers to Discharge: Medical stability   Possible Resolutions to [employer] Focus:  Continue dietary education with the help of registered dietitian.   Continued Need for Acute Rehabilitation Level of Care: The patient requires daily medical management by a physician with specialized training in physical medicine and rehabilitation for the following reasons: Direction of a multidisciplinary physical rehabilitation program to maximize functional independence : Yes Medical management of patient stability for increased activity during  participation in an intensive rehabilitation regime.: Yes Analysis of laboratory values and/or radiology reports with any subsequent need for medication adjustment and/or medical intervention. : Yes   I attest that I was present, lead the team conference, and concur with the assessment and plan of the team.   Dorien Chihuahua B 06/27/2021, 1:57 PM

## 2021-06-27 NOTE — Progress Notes (Signed)
At morning med pass, patient refused Creon capsule and Tums. Meds disposed of according to facility policy. Pt stated the Creon causes very loose, uncontrollable bowel movements despite loperamide.

## 2021-06-27 NOTE — Progress Notes (Signed)
Occupational Therapy Session Note  Patient Details  Name: Jonathan Mcmillan MRN: [id number] Date of Birth: 01/22/1996  Today's Date: 06/27/2021 OT Individual Time: 0865-7846 OT Individual Time Calculation (min): 38 min    Short Term Goals: Week 1:  OT Short Term Goal 1 (Week 1): Pt will be able to don pants with min A or less. OT Short Term Goal 2 (Week 1): Pt will be able to consistently complete sit to stands with mod A or less. OT Short Term Goal 3 (Week 1): Pt will complete squat pivot transfers to toilet with min A. OT Short Term Goal 4 (Week 1): Pt will engage in room HEP for his UEs in between therapy sessions.   Skilled Therapeutic Interventions/Progress Updates:    Pt greeted at time of session in hall hand off from PT from previous session, pt initially mildly agitated in hall when offered choice of outside vs UB strength, showing signs of being overwhelmed/overstimulated. Lead to quiet ortho gym for limited stimulation and focused on UB strength with pt in supine on mat for 2x20 of the following with 4# dowel: chest press, overhead raise, central/lateral lean with targets to visually aim for. Pt noted to be more calm with limited choices and following a course of action. C/o feet pain throughout session 2/2 edema from HD, provided seated and supine activites when able for comfort. Finished session on SCIFIT for 8:30 mins on level 7 for global endurance training. Note sit > stands from lower surfaces MOD A but ambulated CGA/Supervision. Pt ambulated back to room with RW and set up bed level alarm on call bell in reach.   Therapy Documentation Precautions:  Precautions Precautions: Fall Precaution Comments: R temporary femoral dialysis catheter removed 06/20/21, low activity tolerance, L lateral heel and foot wound, sacral wound Restrictions Weight Bearing Restrictions: No     Therapy/Group: Individual Therapy  Viona Gilmore 06/27/2021, 12:52 PM

## 2021-06-27 NOTE — Evaluation (Signed)
Speech Language Pathology Assessment and Plan  Patient Details  Name: Jonathan Mcmillan MRN: [id number] Date of Birth: [birth year]  SLP Diagnosis: Cognitive Impairments  Rehab Potential: Good ELOS: 2 weeks (shorter for ST)    Today's Date: 06/27/2021 SLP Individual Time: 0905-1000 SLP Individual Time Calculation (min): 69 min   Hospital Problem: Principal Problem:   Anoxic encephalopathy ([place]) Active Problems:   Type 1 diabetes mellitus with complication ([place])   ESRD on dialysis ([place])   Debility  Past Medical History:  Past Medical History:  Diagnosis Date   Cannabinoid hyperemesis syndrome    CKD (chronic kidney disease), stage IV ([place])    Diabetes 1.5, managed as type 1 ([place])    DKA (diabetic ketoacidoses) 05/21/2016   Gastroparesis    HTN (hypertension)    Nicotine dependence    Perirectal abscess 06/16/2017   Right arm cellulitis 01/26/2018   Past Surgical History:  Past Surgical History:  Procedure Laterality Date   COLONOSCOPY WITH PROPOFOL N/A 06/21/2021   Procedure: COLONOSCOPY WITH PROPOFOL;  Surgeon: Jonathan Landsman, MD;  Location: [facility] ENDOSCOPY;  Service: Gastroenterology;  Laterality: N/A;   DIALYSIS/PERMA CATHETER INSERTION N/A 06/15/2021   Procedure: DIALYSIS/PERMA CATHETER INSERTION;  Surgeon: Jonathan Huxley, MD;  Location: [place] CV LAB;  Service: Cardiovascular;  Laterality: N/A;   DIALYSIS/PERMA CATHETER INSERTION N/A 06/19/2021   Procedure: DIALYSIS/PERMA CATHETER INSERTION;  Surgeon: Jonathan Huxley, MD;  Location: [place] CV LAB;  Service: Cardiovascular;  Laterality: N/A;   INCISION AND DRAINAGE PERIRECTAL ABSCESS N/A 06/16/2017   Procedure: IRRIGATION AND DEBRIDEMENT PERIRECTAL ABSCESS;  Surgeon: Jonathan Glen, MD;  Location: [facility] ORS;  Service: General;  Laterality: N/A;   none      Assessment / Plan / Recommendation Clinical Impression  Jonathan Mcmillan is a 26 year old male who presented to the [facility] emergency  department on 05/31/2021 with fever, chills and vomiting.  Initial work-up revealed profound anemia and significantly elevated serum creatinine above baseline level.  Respiratory viral panels including COVID and influenza were negative.  He was admitted for further evaluation and work-up.  Nephrology consultation obtained.  The patient's past medical history includes chronic kidney disease stage IV, hypertension, diabetes mellitus type 1, gastroesophageal reflux disease.  Primary care obtained at [facility].  Followed by Jonathan Mcmillan nephrology last seen 08/01/2020.  On 06/01/2021, the patient collapsed while toileting and rapid response was called.  He lost his pulse and CODE BLUE was called.  Was initially asystolic but progressed to PEA.  ACLS protocol carried out and patient was intubated.  Regained pulse and transferred to the ICU.  Critical care management consulted.  Cardiology consultation obtained.  A left IJ temporary dialysis catheter was placed and seen RT initiated.  Concern for aspiration pneumonia, antibiotics started.  The echo revealed ejection fraction of 35 to 40%.  On 1/22, worsening thrombocytopenia noted and hematology consulted.  Platelet infusion given.  He was noted to be in atrial fibrillation with rapid ventricular response on 1/23.  On 1/25, palliative care consultation obtained.  Patient was extubated on 1/26.  Temporary dialysis catheter malfunctioned and a right femoral temporary catheter was placed on 1/28.  CRRT was discontinued and intermittent hemodialysis started on 1/30.  A vascular consultation was obtained and a right IJ tunneled dialysis catheter was placed by Dr. Lucky Mcmillan on 2/6.  Patient was noted to have developed a stage II sacral and heel pressure injuries.  Jonathan Mcmillan was started for low ejection  fraction on 2/7.  Speech therapy evaluation carried out and the patient's diet was slowly advanced from dysphagia 1 to regular renal diet/carb modified with  fluid restriction of 1200 mL.  Gastroenterology consultation obtained on 2/7 secondary to ongoing nonbloody diarrhea with stool lactoferrin positivity.  He underwent colonoscopy on 2/8 with poor prep.  Biopsies of normal mucosa of the entire colon were obtained.  Patient will need to follow-up as outpatient for pathology report and further recommendations.  Patient transferred to CIR on 06/22/2021 .    Pt presents mild cognitive impairments in problem solving and awareness, which was further supported by formal cognitive linguistic assessment utilizing Cognistat. Pt scored WFL on all subsections, except for mild deficits in calculations (2), judgement (3)  and construction task (3.) Pt support cognitive functions are at baseline, however also expressed " I did not care of my self." SLP suspects pt had poor safety awareness at baseline. Pt did not use a pill organizer or consumed scheduled medication prior to admission and is agreeable to participate in [place] services for education on medication management and overall safety awareness. Pt supports no acute changes in speech nor swallow function. SLP recommends treating pt for a brief period of time to aid in problem solving utilizing medication management and time management as well as safety awareness. Pt's family managed bill paying. Pt would benefit from skilled ST services in order to maximize functional independence and reduce burden of care, likely requiring supervision at discharge with continued skilled ST services.     Skilled Therapeutic Interventions          Skilled ST services focused on cognitive skills. SLP facilitated administration of cognitive linguistic formal assessment and provided education of results. SLP and pt collaborated to set goals for cognitive linguistic needs during length of stay. All questions answered to satisfaction. SLP created medication list and pt answered verbal problem solving question pertaining to BID medications mod I. Pt  was left in room with call bell within reach and bed alarm set. SLP recommends to continue skilled services.   SLP Assessment  Patient will need skilled [place] [facility] during CIR admission    Recommendations  Patient destination: Home Follow up Recommendations: None;Other (comment) (intermittment supervision A and full supervision with IADLs.) Equipment Recommended: None recommended by SLP    SLP Frequency 1 to 3 out of 7 days   SLP Duration  SLP Intensity  SLP Treatment/Interventions 2 weeks (shorter for ST)  Minumum of 1-2 x/day, 30 to 90 minutes  Cognitive remediation/compensation;Functional tasks;Medication managment;Patient/family education;Cueing hierarchy    Pain Pain Assessment Pain Scale: 0-10 Pain Score: 7  Pain Type: Chronic pain Pain Location: Buttocks Pain Orientation: Mid Pain Descriptors / Indicators: Aching Pain Frequency: Intermittent Pain Onset: On-going Patients Stated Pain Goal: 2 Pain Intervention(s): Medication (See eMAR)  Prior Functioning Cognitive/Linguistic Baseline: Information not available Type of Home: House  Lives With: Family Available Help at Discharge: Family;Available PRN/intermittently Vocation: Part time employment  SLP Evaluation Cognition Overall Cognitive Status: No family/caregiver present to determine baseline cognitive functioning Arousal/Alertness: Awake/alert Orientation Level: Oriented X4 Attention: Selective Selective Attention: Appears intact Memory: Impaired Awareness: Impaired Awareness Impairment: Anticipatory impairment;Emergent impairment;Other (comment) (safety) Problem Solving: Impaired Problem Solving Impairment: Functional complex;Verbal complex Safety/Judgment: Impaired  Comprehension Auditory Comprehension Overall Auditory Comprehension: Appears within functional limits for tasks assessed Yes/No Questions: Within Functional Limits Commands: Within Functional Limits Conversation:  Complex Expression Expression Primary Mode of Expression: Verbal Verbal Expression Overall Verbal Expression: Appears within functional limits  for tasks assessed Oral Motor Oral Motor/Sensory Function Overall Oral Motor/Sensory Function: Within functional limits Motor Speech Overall Motor Speech: Appears within functional limits for tasks assessed Respiration: Within functional limits Phonation: Normal  Care Tool Care Tool Cognition Ability to hear (with hearing aid or hearing appliances if normally used Ability to hear (with hearing aid or hearing appliances if normally used): 0. Adequate - no difficulty in normal conservation, social interaction, listening to TV   Expression of Ideas and Wants Expression of Ideas and Wants: 4. Without difficulty (complex and basic) - expresses complex messages without difficulty and with speech that is clear and easy to understand   Understanding Verbal and Non-Verbal Content Understanding Verbal and Non-Verbal Content: 3. Usually understands - understands most conversations, but misses some part/intent of message. Requires cues at times to understand  Memory/Recall Ability Memory/Recall Ability : Location of own room;That he or she is in a hospital/hospital unit;Current season    Short Term Goals: Week 1: SLP Short Term Goal 1 (Week 1): Pt will participate in medication management task, pill organizer mod I. SLP Short Term Goal 2 (Week 1): Pt will participate in safety awareness education and pt will verbalize safety protocol mod I. SLP Short Term Goal 3 (Week 1): Pt will complete calendar tasks mod I.  Refer to Care Plan for Long Term Goals  Recommendations for other services: Neuropsych  Discharge Criteria: Patient will be discharged from SLP if patient refuses treatment 3 consecutive times without medical reason, if treatment goals not met, if there is a change in medical status, if patient makes no progress towards goals or if patient is  discharged from hospital.  The above assessment, treatment plan, treatment alternatives and goals were discussed and mutually agreed upon: by patient  Lavinia Mcneely  [facility] 06/27/2021, 11:53 AM

## 2021-06-27 NOTE — Progress Notes (Signed)
Physical Therapy Session Note  Patient Details  Name: Jonathan Mcmillan MRN: [id number] Date of Birth: 1996-04-05  Today's Date: 06/27/2021 PT Individual Time: 1440-1505 PT Individual Time Calculation (min): 25 min   Short Term Goals: Week 1:  PT Short Term Goal 1 (Week 1): Pt will perform bed mobility with consistent CGA. PT Short Term Goal 2 (Week 1): Pt will consistently perform standing functional transfers with MinA. PT Short Term Goal 3 (Week 1): Pt will ambulate at least 150 feet consistently with improved heel-to-toe gait pattern. PT Short Term Goal 4 (Week 1): Pt will initiate stair training.  Skilled Therapeutic Interventions/Progress Updates:    Pt received R sidelying in bed with nurse arriving shortly after therapist with pain medication; however, pt adamantly requesting to wait and take his medicine after he is finished with therapy so he can just relax. With min encouragement pt reluctantly agreeable to therapy session. R sidelying>sitting L EOB with HOB partially elevated and supervision with increased time.   Pt reports he cannot put on his shoes due to B LE swelling - pt mentioned a few times during the session that he is experiencing increased  BLE edema making his LEs painful and more difficult to move.   Pt requesting for bed to be elevated to allow easier rise into standing. Sit>stand elevated EOB>RW with CGA - delayed trunk/hip extension while rising.   Gait training ~260ft to day room using RW with CGA - pt with slow gait speed though achieving reciprocal stepping pattern and appearing to have slight balance instability.  B LE strengthening on Nustep against level 4 decreased to level 2 resistance as pt demos significant strength impairment in B LEs for 2 minutes totaling 31 steps.   Sit>stand from lower seat height of Nustep with min assist for lifting hips and pt having even more delayed trunk/hip extension to achieve upright posture from this lower surface.  Gait  training ~153ft back towards his room using RW as described above with pt demoing same gait deviations. Pt hand-off to OT in hallway.  Therapy Documentation Precautions:  Precautions Precautions: Fall Precaution Comments: R temporary femoral dialysis catheter removed 06/20/21, low activity tolerance, L lateral heel and foot wound, sacral wound Restrictions Weight Bearing Restrictions: No   Pain: Reports increased B LE pain due to swelling, unrated, and pt adamantly requests to wait until after therapy session to receive medication - therapist provided distraction, emotional support, and rest breaks for pain management.    Therapy/Group: Individual Therapy  Tawana Scale , PT, DPT, NCS, CSRS  06/27/2021, 12:27 PM

## 2021-06-28 LAB — CBC
HCT: 25.9 % — ABNORMAL LOW (ref 39.0–52.0)
Hemoglobin: 8.1 g/dL — ABNORMAL LOW (ref 13.0–17.0)
MCH: 29.3 pg (ref 26.0–34.0)
MCHC: 31.3 g/dL (ref 30.0–36.0)
MCV: 93.8 fL (ref 80.0–100.0)
Platelets: 86 10*3/uL — ABNORMAL LOW (ref 150–400)
RBC: 2.76 MIL/uL — ABNORMAL LOW (ref 4.22–5.81)
RDW: 15.1 % (ref 11.5–15.5)
WBC: 13.6 10*3/uL — ABNORMAL HIGH (ref 4.0–10.5)
nRBC: 0 % (ref 0.0–0.2)

## 2021-06-28 LAB — RENAL FUNCTION PANEL
Albumin: <1.5 g/dL — ABNORMAL LOW (ref 3.5–5.0)
Anion gap: 11 (ref 5–15)
BUN: 33 mg/dL — ABNORMAL HIGH (ref 6–20)
CO2: 25 mmol/L (ref 22–32)
Calcium: 7 mg/dL — ABNORMAL LOW (ref 8.9–10.3)
Chloride: 102 mmol/L (ref 98–111)
Creatinine, Ser: 5.01 mg/dL — ABNORMAL HIGH (ref 0.61–1.24)
GFR, Estimated: 15 mL/min — ABNORMAL LOW (ref >60)
Glucose, Bld: 195 mg/dL — ABNORMAL HIGH (ref 70–99)
Phosphorus: 6.2 mg/dL — ABNORMAL HIGH (ref 2.5–4.6)
Potassium: 4.7 mmol/L (ref 3.5–5.1)
Sodium: 138 mmol/L (ref 135–145)

## 2021-06-28 LAB — GLUCOSE, CAPILLARY
Glucose-Capillary: 104 mg/dL — ABNORMAL HIGH (ref 70–99)
Glucose-Capillary: 166 mg/dL — ABNORMAL HIGH (ref 70–99)
Glucose-Capillary: 167 mg/dL — ABNORMAL HIGH (ref 70–99)

## 2021-06-28 MED ORDER — ACETAMINOPHEN 325 MG PO TABS
325.0000 mg | ORAL_TABLET | ORAL | 0 refills | Status: AC | PRN
  Filled 2021-06-28: qty 100, 9d supply, fill #0

## 2021-06-28 MED ORDER — OXYCODONE HCL 5 MG PO TABS
5.0000 mg | ORAL_TABLET | ORAL | 0 refills | Status: DC | PRN
  Filled 2021-06-28: qty 30, 5d supply, fill #0

## 2021-06-28 MED ORDER — HYDROCERIN EX CREA
1.0000 "application " | TOPICAL_CREAM | Freq: Two times a day (BID) | CUTANEOUS | 0 refills | Status: DC
  Filled 2021-06-28: qty 228, 29d supply, fill #0

## 2021-06-28 MED ORDER — CALCITRIOL 0.5 MCG PO CAPS
0.5000 ug | ORAL_CAPSULE | Freq: Every day | ORAL | 0 refills | Status: DC
  Filled 2021-06-28: qty 30, 30d supply, fill #0

## 2021-06-28 MED ORDER — PROSOURCE PLUS PO LIQD: 30.0000 mL | Freq: Three times a day (TID) | ORAL | Status: DC

## 2021-06-28 MED ORDER — CARVEDILOL 3.125 MG PO TABS
3.1250 mg | ORAL_TABLET | Freq: Two times a day (BID) | ORAL | 0 refills | Status: DC
  Filled 2021-06-28: qty 60, 30d supply, fill #0

## 2021-06-28 MED ORDER — INSULIN ASPART 100 UNIT/ML FLEXPEN
0.0000 [IU] | PEN_INJECTOR | Freq: Three times a day (TID) | SUBCUTANEOUS | 0 refills | Status: DC
  Filled 2021-06-28: qty 6, 30d supply, fill #0

## 2021-06-28 MED ORDER — METHOCARBAMOL 500 MG PO TABS
500.0000 mg | ORAL_TABLET | Freq: Four times a day (QID) | ORAL | 0 refills | Status: DC | PRN
  Filled 2021-06-28: qty 60, 15d supply, fill #0

## 2021-06-28 MED ORDER — HEPARIN SODIUM (PORCINE) 1000 UNIT/ML IJ SOLN
1000.0000 [IU] | Freq: Once | INTRAMUSCULAR | Status: AC
  Administered 2021-06-28: 1000 [IU] via INTRAVENOUS
  Filled 2021-06-28: qty 1

## 2021-06-28 MED ORDER — THIAMINE HCL 100 MG PO TABS
100.0000 mg | ORAL_TABLET | Freq: Every day | ORAL | 0 refills | Status: DC
  Filled 2021-06-28: qty 30, 30d supply, fill #0

## 2021-06-28 MED ORDER — RENA-VITE PO TABS
1.0000 | ORAL_TABLET | Freq: Every day | ORAL | 0 refills | Status: DC
  Filled 2021-06-28: qty 30, 30d supply, fill #0

## 2021-06-28 MED ORDER — INSULIN ASPART 100 UNIT/ML IJ SOLN: 0.0000 [IU] | Freq: Three times a day (TID) | INTRAMUSCULAR | 11 refills | Status: DC

## 2021-06-28 MED ORDER — FERROUS SULFATE 325 (65 FE) MG PO TABS
325.0000 mg | ORAL_TABLET | Freq: Every day | ORAL | 0 refills | Status: DC
  Filled 2021-06-28: qty 30, 30d supply, fill #0

## 2021-06-28 MED ORDER — DARBEPOETIN ALFA 60 MCG/0.3ML IJ SOSY
60.0000 ug | PREFILLED_SYRINGE | INTRAMUSCULAR | Status: DC
Start: 2021-06-30 — End: 2024-02-12

## 2021-06-28 MED ORDER — AMIODARONE HCL 200 MG PO TABS
200.0000 mg | ORAL_TABLET | Freq: Every day | ORAL | 0 refills | Status: DC
  Filled 2021-06-28: qty 30, 30d supply, fill #0

## 2021-06-28 MED ORDER — COLLAGENASE 250 UNIT/GM EX OINT: TOPICAL_OINTMENT | Freq: Every day | CUTANEOUS | 0 refills | Status: DC

## 2021-06-28 MED ORDER — CINACALCET HCL 30 MG PO TABS
30.0000 mg | ORAL_TABLET | Freq: Every day | ORAL | 0 refills | Status: DC
  Filled 2021-06-28: qty 30, 30d supply, fill #0

## 2021-06-28 MED ORDER — ASCORBIC ACID 500 MG PO TABS
500.0000 mg | ORAL_TABLET | Freq: Two times a day (BID) | ORAL | 0 refills | Status: DC
  Filled 2021-06-28: qty 30, 15d supply, fill #0

## 2021-06-28 MED ORDER — PANCRELIPASE (LIP-PROT-AMYL) 36000-114000 UNITS PO CPEP
36000.0000 [IU] | ORAL_CAPSULE | Freq: Three times a day (TID) | ORAL | 0 refills | Status: DC
  Filled 2021-06-28: qty 90, 30d supply, fill #0

## 2021-06-28 MED ORDER — LOPERAMIDE HCL 2 MG PO CAPS
2.0000 mg | ORAL_CAPSULE | Freq: Two times a day (BID) | ORAL | 0 refills | Status: DC
  Filled 2021-06-28: qty 60, 30d supply, fill #0

## 2021-06-28 MED ORDER — SACUBITRIL-VALSARTAN 24-26 MG PO TABS
1.0000 | ORAL_TABLET | Freq: Two times a day (BID) | ORAL | 0 refills | Status: DC
  Filled 2021-06-28: qty 60, 30d supply, fill #0

## 2021-06-28 MED ORDER — OMEPRAZOLE 40 MG PO CPDR
40.0000 mg | DELAYED_RELEASE_CAPSULE | Freq: Every day | ORAL | 0 refills | Status: DC
  Filled 2021-06-28: qty 30, 30d supply, fill #0

## 2021-06-28 MED ORDER — ONDANSETRON HCL 4 MG PO TABS
4.0000 mg | ORAL_TABLET | Freq: Three times a day (TID) | ORAL | 0 refills | Status: DC | PRN
  Filled 2021-06-28: qty 10, 4d supply, fill #0

## 2021-06-28 MED ORDER — CALCIUM CARBONATE ANTACID 500 MG PO CHEW
1.0000 | CHEWABLE_TABLET | Freq: Three times a day (TID) | ORAL | 0 refills | Status: DC
Start: 2021-06-28 — End: 2024-02-12
  Filled 2021-06-28: qty 100, 34d supply, fill #0

## 2021-06-28 NOTE — Progress Notes (Signed)
Nutrition Brief Note  Additional diet education handout regarding renal diet "Eating Healthy with Kidney Disease" given and placed in pt discharge instructions.   Corrin Parker, MS, RD, LDN RD pager number/after hours weekend pager number on Amion.

## 2021-06-28 NOTE — Progress Notes (Signed)
PROGRESS NOTE   Subjective/Complaints:  Discussed LE swelling with pt , alb <1.5 related to ESRD, proteinuria  Discussed use of compression hose and elevation , pt has not been compliant with Na+ restriction  ROS: limited due to cognition/behavior   Objective:   No results found. Recent Labs    06/26/21 0623  WBC 12.6*  HGB 8.5*  HCT 25.9*  PLT 64*    Recent Labs    06/26/21 0623  NA 138  K 5.4*  CL 102  CO2 27  GLUCOSE 159*  BUN 36*  CREATININE 5.15*  CALCIUM 6.7*     Intake/Output Summary (Last 24 hours) at 06/28/2021 0858 Last data filed at 06/28/2021 0810 Gross per 24 hour  Intake 840 ml  Output --  Net 840 ml      Pressure Injury 06/01/21 Buttocks Right;Lower Stage 2 -  Partial thickness loss of dermis presenting as a shallow open injury with a red, pink wound bed without slough. (Active)  06/01/21 1030  Location: Buttocks  Location Orientation: Right;Lower  Staging: Stage 2 -  Partial thickness loss of dermis presenting as a shallow open injury with a red, pink wound bed without slough.  Wound Description (Comments):   Present on Admission: Yes    Physical Exam: Vital Signs Blood pressure 130/84, pulse 84, temperature 98.5 F (36.9 C), temperature source Oral, resp. rate 20, height 5\' 8"  (1.727 m), weight 70.4 kg, SpO2 100 %.    General: No acute distress Mood and affect are appropriate Heart: Regular rate and rhythm no rubs murmurs or extra sounds Lungs: Clear to auscultation, breathing unlabored, no rales or wheezes Abdomen: Positive bowel sounds, soft nontender to palpation, nondistended Extremities: No clubbing, cyanosis, or edema Ext 1+ pre tib and 2+ pedal edema , no UE edema , no tenderness to palpation  Neuro:  Pt alert, oriented to self, hospital. Follows basic commands. Normal language, speech low volume. Fair insight. Good bed mobility. Motor 4/5 in BUE and 5/5 BLE . Senses pain  and light touch with sl decreased distally 3/3 immediate and 2 min recall 3 unrelated objects  Musculoskeletal: Full ROM, No pain with AROM or PROM in the neck, trunk, or extremities. Posture appropriate     Assessment/Plan: 1. Functional deficits which require 3+ hours per day of interdisciplinary therapy in a comprehensive inpatient rehab setting. Physiatrist is providing close team supervision and 24 hour management of active medical problems listed below. Physiatrist and rehab team continue to assess barriers to discharge/monitor patient progress toward functional and medical goals  Care Tool:  Bathing    Body parts bathed by patient: Front perineal area, Buttocks   Body parts bathed by helper: Buttocks, Right upper leg, Left upper leg, Right lower leg, Left lower leg     Bathing assist Assist Level: Moderate Assistance - Patient 50 - 74%     Upper Body Dressing/Undressing Upper body dressing   What is the patient wearing?: Pull over shirt    Upper body assist Assist Level: Contact Guard/Touching assist    Lower Body Dressing/Undressing Lower body dressing      What is the patient wearing?: Pants, Underwear/pull up  Lower body assist Assist for lower body dressing: Moderate Assistance - Patient 50 - 74%     Toileting Toileting    Toileting assist Assist for toileting: 2 Helpers     Transfers Chair/bed transfer  Transfers assist     Chair/bed transfer assist level: Minimal Assistance - Patient > 75%     Locomotion Ambulation   Ambulation assist      Assist level: Contact Guard/Touching assist Assistive device: Walker-rolling Max distance: 238ft   Walk 10 feet activity   Assist     Assist level: Minimal Assistance - Patient > 75% Assistive device: Walker-rolling   Walk 50 feet activity   Assist    Assist level: Minimal Assistance - Patient > 75% Assistive device: Walker-rolling    Walk 150 feet activity   Assist    Assist  level: Minimal Assistance - Patient > 75% Assistive device: Walker-rolling    Walk 10 feet on uneven surface  activity   Assist Walk 10 feet on uneven surfaces activity did not occur: Safety/medical concerns         Wheelchair     Assist Is the patient using a wheelchair?: Yes Type of Wheelchair: Manual    Wheelchair assist level: Total Assistance - Patient < 25% Max wheelchair distance: 150 ft    Wheelchair 50 feet with 2 turns activity    Assist        Assist Level: Total Assistance - Patient < 25%   Wheelchair 150 feet activity     Assist      Assist Level: Total Assistance - Patient < 25%   Blood pressure 130/84, pulse 84, temperature 98.5 F (36.9 C), temperature source Oral, resp. rate 20, height 5\' 8"  (1.727 m), weight 70.4 kg, SpO2 100 %.  Medical Problem List and Plan: 1. Functional deficits secondary to anoxic brain injury from  renal failure, heart failure, PEA arrest              -patient may not shower due to HD catheter             -ELOS/Goals: 07/04/21 mod I/supervision  -  2.  Antithrombotics: -DVT/anticoagulation:  Pharmaceutical: Lovenox             -antiplatelet therapy: none 3. Pain Management: Tylenol, oxycodone  -appears controlled at present  2/12- pain controlled- denies- con't regimen 4. Mood: LCSW to evaluate and provide emotional support             -antipsychotic agents: n/a 5. Neuropsych: This patient is? (Poor memory) capable of making decisions on his own behalf. 6. Skin/Wound Care: Sacral and Left heel unstageable/ decubitus ulcer wound care:   -air  mattress, appropriate w/c cushion -WOC nurse consult- santyl to sacral wound -foam dressings for feet.              --Eucerin for dry skin  7. Fluids/Electrolytes/Nutrition: Fluid restriction 1200 mL daily             --per nephrology             --renal/carb modified diet. Continue supplements 8: ESRD on iHD via right IJ TDC on MWF schedule 9: DM 1: Continue  Semglee 5 units at bedtime, SSI meal/HS coverage; CBGs- no oral agents due to type 1 DM  CBG (last 3)  Recent Labs    06/27/21 1636 06/27/21 2121 06/28/21 0645  GLUCAP 79 169* 104*     Controlled 2/15 10: Heart failure: Continue Entresto BID, Coreg- L>R pedal edema  should improve with HD, may need compression hose  11: Atrial fibrillation: Continue amiodarone BID; ? need for AC 12: Hypertension: continue Coreg   Vitals:   06/27/21 1927 06/28/21 0424  BP: 122/74 130/84  Pulse: 86 84  Resp: 16 20  Temp: 99 F (37.2 C) 98.5 F (36.9 C)  SpO2: 100% 100%   Controlled 2/15 13: Anemia, chronic disease: Is getting iron supplementation  2/10 hgb up to 7.3 today--recheck Monday 14: Tobacco use: provide cessation counseling             --? Nicotine patch 15: GERD: Continue PPI 16: Thrombocytopenia: Continue to monitor platelet count             --No signs of bleeding  2/10 platelets 62k (54 previously) 17: Aspiration pneumonia: treated and resolved   19: Diarrhea: resolved , continent  20. Leukocytosis  2/13 stable at 12.6  21.  Hypoalbuminemia-nephrotic syndrome , TED hose for LE edema, diet compliance an issue in regards to Na+     LOS: 6 days A FACE TO FACE EVALUATION WAS PERFORMED  Jonathan Mcmillan 06/28/2021, 8:58 AM

## 2021-06-28 NOTE — Progress Notes (Signed)
Occupational Therapy Session Note  Patient Details  Name: Jonathan Mcmillan MRN: [id number] Date of Birth: 22-Oct-1995  Today's Date: 06/28/2021 OT Individual Time: 0037-0488 OT Individual Time Calculation (min): 60 min    Short Term Goals: Week 1:  OT Short Term Goal 1 (Week 1): Pt will be able to don pants with min A or less. OT Short Term Goal 2 (Week 1): Pt will be able to consistently complete sit to stands with mod A or less. OT Short Term Goal 3 (Week 1): Pt will complete squat pivot transfers to toilet with min A. OT Short Term Goal 4 (Week 1): Pt will engage in room HEP for his UEs in between therapy sessions.  Skilled Therapeutic Interventions/Progress Updates:    Pt received in bed and stated he needed to use the restroom. Pt sat to EOB without A, stood to RW with S rising from tall bed. Ambulated with close S to toilet and used elevated toilet. S with 3/3 steps of toileting.  He then ambulated to sit in wc to go to gym.  Pt transported to gym and then used RW to sit in arm chair for exercises.  Using weighted dowel bars, wrist and ankle weights pt worked on numerous shoulder, tricep, bicep, quad exercises.  He still has great difficulty with chair push ups and can not stand up from a regular height chair without some Assist when he is with therapy.  Pt then taken to arm bike and worked at resistance 7 for 1 min as fast as he could at RPM 40-42, rested 30 sec then repeated for a total of 6 minutes.  Pt taken back to room and stood from wc with S to Rw to get to bed.  Pt stated he needed help to get legs into bed. Showed him how to use gait belt as a leg lifter, but pt complained his legs were sore. Had pt try with max encouragement and he was able to to.  Pt wanted to call RN in to pull his covers up. Told pt he can do that himself and pt got annoyed saying we did not want to do our jobs. Reminded pt he is only 25 and pulling covers over his legs is not a challenging task. Pt did  this.  Resting in bed. Set up his lunch tray.  Alarm set and all needs met.   Therapy Documentation Precautions:  Precautions Precautions: Fall Precaution Comments: R temporary femoral dialysis catheter removed 06/20/21, low activity tolerance, L lateral heel and foot wound, sacral wound Restrictions Weight Bearing Restrictions: No    Pain: Pain Assessment Pain Scale: 0-10 Pain Score: 0-No pain    Therapy/Group: Individual Therapy  Leland 06/28/2021, 1:17 PM

## 2021-06-28 NOTE — Progress Notes (Signed)
[facility] Progress Note   Subjective:   Patient seen and examined during HD.  Tolerating treatment well so far.  Denies CP, SOB, abdominal pain and n/v/d.   Objective Vitals:   06/27/21 1927 06/28/21 0424 06/28/21 1429 06/28/21 1439  BP: 122/74 130/84 127/80 (!) 145/83  Pulse: 86 84 87 89  Resp: 16 20 18    Temp: 99 F (37.2 C) 98.5 F (36.9 C) 98.6 F (37 C)   TempSrc: Oral Oral    SpO2: 100% 100%    Weight:  70.4 kg 72.8 kg   Height:       Physical Exam General:Alert, chronically ill appearing male in NAD Heart:RRR Lungs:CTAB, nml WOB on RA Abdomen:soft, NTND Extremities:2+ LE edema Dialysis Access: R [facility]   Filed Weights   06/27/21 0500 06/28/21 0424 06/28/21 1429  Weight: 74 kg 70.4 kg 72.8 kg    Intake/Output Summary (Last 24 hours) at 06/28/2021 1449 Last data filed at 06/28/2021 0810 Gross per 24 hour  Intake 600 ml  Output --  Net 600 ml    Additional Objective Labs: Basic Metabolic Panel: Recent Labs  Lab 06/23/21 1421 06/26/21 0623 06/28/21 1309  NA 136 138 138  K 5.0 5.4* 4.7  CL 102 102 102  CO2 25 27 25   GLUCOSE 179* 159* 195*  BUN 27* 36* 33*  CREATININE 4.37* 5.15* 5.01*  CALCIUM 6.2* 6.7* 7.0*  PHOS 6.2* 6.3* 6.2*   Liver Function Tests: Recent Labs  Lab 06/23/21 1421 06/26/21 0623 06/28/21 1309  ALBUMIN <1.5* <1.5* <1.5*   CBC: Recent Labs  Lab 06/22/21 2019 06/23/21 0950 06/23/21 1421 06/26/21 0623 06/28/21 1309  WBC 11.1* 10.9* 12.1* 12.6* 13.6*  NEUTROABS  --   --   --  9.3*  --   HGB 6.6* 7.3* 6.7* 8.5* 8.1*  HCT 20.7* 22.8* 21.1* 25.9* 25.9*  MCV 92.4 93.8 94.2 91.8 93.8  PLT 54* 62* 56* 64* 86*    CBG: Recent Labs  Lab 06/27/21 1118 06/27/21 1636 06/27/21 2121 06/28/21 0645 06/28/21 1214  GLUCAP 141* 79 169* 104* 166*   Iron Studies: No results for input(s): IRON, TIBC, TRANSFERRIN, FERRITIN in the last 72 hours. Lab Results  Component Value Date   INR 1.3 (H) 06/04/2021   INR 2.3  (H) 06/01/2021   INR 1.3 (H) 05/31/2021   Studies/Results: No results found.  Medications:   (feeding supplement) PROSource Plus  30 mL Oral TID BM   sodium chloride   Intravenous Once   amiodarone  200 mg Oral Daily   vitamin C  500 mg Oral BID   calcitRIOL  0.5 mcg Oral Daily   calcium carbonate  1 tablet Oral TID   carvedilol  3.125 mg Oral BID WC   Chlorhexidine Gluconate Cloth  6 each Topical Q0600   cinacalcet  30 mg Oral Q breakfast   collagenase   Topical Daily   darbepoetin (ARANESP) injection - DIALYSIS  60 mcg Intravenous Q Fri-HD   ferrous sulfate  325 mg Oral Q breakfast   hydrocerin   Topical Daily   insulin aspart  0-5 Units Subcutaneous QHS   insulin aspart  0-6 Units Subcutaneous TID WC   lipase/protease/amylase  36,000 Units Oral TID WC   loperamide  2 mg Oral BID   multivitamin  1 tablet Oral QHS   pantoprazole  40 mg Oral Daily   sacubitril-valsartan  1 tablet Oral BID   thiamine  100 mg Oral Daily    Dialysis  Orders: set up for OP HD on MWF schedule w/ [facility]   Assessment/ Plan: Debility - In CIR sent from [facility] ESRD - new start to HD, 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 Pt rec'd CRRT for 1 week then was transitioned to iHD. Jarratt placed on 2/03. Does not have AVF yet. Will need outpatient referral for permanent access placement as he will d/c tomorrow. CLIP: accepted to [facility] MWF 11:45am chair. Next HD 2/15 Atrial fib-per primary Volume - some abd wall edema and pedal edema. UF as tolerated.  Continue education about fluid restrictions.  Anemia ckd - Hb low in the 7-8 range, tsat was 45%, ordered aranesp 60 mcg weekly on fridays, 1st dose was 2/10.  Transfuse prn MBD ckd - Ca in range w/ alb correction, phos 6.2, low phos diet Anoxic brain injury SP PEA arrest - in hospital, shortly after admitted Heart failure - getting entresto and coreg  DM1 - per pmd Asp PNA - sp abx   Dispo - originally to d/c on 2/21 but now plan to d/c tomorrow due to patient not participating in therapy per notes.  Can start dialysis on Friday at outpatient unit.  Jen Mow, PA-C [facility] 06/28/2021,2:49 PM  LOS: 6 days

## 2021-06-28 NOTE — Progress Notes (Signed)
Pt presents with multiple complaints through out the shift. Pt c/o "[place] starving him and not giving enough food"; pt demanded staff to provide more food and beverages. Pt  was given the ordered HS snack and demanded additional crackers and sodas. Pt was educated on the importance of eating things to support the ordered diet; pt refusing and demanding the additional snacks. Pt also verbalized he was upset because staff continued to turn bed alarm on and follow the Fall Risk Safety protocol. Pt states he " I can walk on my own, I dont need yalls help, just dont bother setting the alarm, I will turn it off". Pt educated on the importance of only getting up with staff members.   Pt also being verbally abusive towards staff, and yelling inappropriate things. Demanding staff to "do what he says" or he will "report our names". Emotional support was provided to the patient in efforts to deescalate the situation,

## 2021-06-28 NOTE — Progress Notes (Signed)
Occupational Therapy Session Note  Patient Details  Name: Jonathan Mcmillan MRN: [id number] Date of Birth: 23-Oct-1995  Today's Date: 06/28/2021 OT Individual Time: 1005-1059 OT Individual Time Calculation (min): 54 min    Short Term Goals: Week 1:  OT Short Term Goal 1 (Week 1): Pt will be able to don pants with min A or less. OT Short Term Goal 2 (Week 1): Pt will be able to consistently complete sit to stands with mod A or less. OT Short Term Goal 3 (Week 1): Pt will complete squat pivot transfers to toilet with min A. OT Short Term Goal 4 (Week 1): Pt will engage in room HEP for his UEs in between therapy sessions.  Skilled Therapeutic Interventions/Progress Updates:  Pt greeted supine in bed agreeable to OT intervention. Session focus on BUE strengthening and endurance, functional mobility, dynamic standing balance and decreasing overall caregiver burden.  Pt completed supine>sit with CGA with use of bed features. Pt asked to raise the bed, OTA asked how tall his bed was at home and pt reports "its tall." Pt sit<>stand from elevated EOB with MIN A. CGA for stand pivot to w/c with Rw. Asked pt to roll w/c to gym with pt becoming agitated stating " I just can't today, just roll me." Easily able to redirect and descalate. Pt transported to gym with total A. Pt completed BUE therex with level 3 theraband  as indicated below:  X10 shoulder flexion  X10 bicep curls X10 shoulder horizontal ABD X10 shoulder diagonal pulls X10 shoulder extension  X10 alternating punches X10 bilateral shoulder external rotation   Issued pt written HEP to increase carryover  Attempted to work on sit<>stands from w/c with pt reporting too much pain in buttock. Pt transported to day room to work on standing tolerance and endurance. Pt completed 16 mins of standing therapeutic activity using wii for UB endurance. Pt completed functional mobility all the way back to room with rw and CGA. Pt left seated EOB with all  needs within reach.                            Therapy Documentation Precautions:  Precautions Precautions: Fall Precaution Comments: R temporary femoral dialysis catheter removed 06/20/21, low activity tolerance, L lateral heel and foot wound, sacral wound Restrictions Weight Bearing Restrictions: No  Pain: unrated pain in buttock, new task initiated d/t pain.     Therapy/Group: Individual Therapy  Precious Haws 06/28/2021, 12:17 PM

## 2021-06-28 NOTE — Progress Notes (Signed)
Physical Therapy Session Note  Patient Details  Name: Jonathan Mcmillan MRN: [id number] Date of Birth: Aug 19, 1995  Today's Date: 06/28/2021   Short Term Goals: Week 1:  PT Short Term Goal 1 (Week 1): Pt will perform bed mobility with consistent CGA. PT Short Term Goal 2 (Week 1): Pt will consistently perform standing functional transfers with MinA. PT Short Term Goal 3 (Week 1): Pt will ambulate at least 150 feet consistently with improved heel-to-toe gait pattern. PT Short Term Goal 4 (Week 1): Pt will initiate stair training.  Skilled Therapeutic Interventions/Progress Updates: Pt presented at EOB c/o pain in BLE and "all I want is my pain meds". Pt also c/o "I just had therapy and I have dialysis". Pt educated on therapies all pushed together on HD days as well as praised pt on doing stairs during earlier session. PTA advised on working on LE strengthening in room as short session after PTA finding nsg for pain meds. Upon returning from nsg, pt c/o "I don't want to do more stairs", adv pt only wished to do BLE strengthening in room. Pt with continued complaints and then stating "I don't think I can do this 30 min". PTA left room with nsg present. Pt missed 30 min skilled PT.      Therapy Documentation Precautions:  Precautions Precautions: Fall Precaution Comments: R temporary femoral dialysis catheter removed 06/20/21, low activity tolerance, L lateral heel and foot wound, sacral wound Restrictions Weight Bearing Restrictions: No General: PT Amount of Missed Time (min): 30 Minutes PT Missed Treatment Reason: Patient unwilling to participate   Therapy/Group: Individual Therapy  Jocee Kissick Jami Ohlin, PTA  06/28/2021, 9:38 AM

## 2021-06-28 NOTE — Progress Notes (Signed)
Physical Therapy Session Note  Patient Details  Name: Jonathan Mcmillan MRN: [id number] Date of Birth: 15-Aug-1995  Today's Date: 06/28/2021 PT Individual Time: 7371-0626 PT Individual Time Calculation (min): 55 min   Short Term Goals: Week 1:  PT Short Term Goal 1 (Week 1): Pt will perform bed mobility with consistent CGA. PT Short Term Goal 2 (Week 1): Pt will consistently perform standing functional transfers with MinA. PT Short Term Goal 3 (Week 1): Pt will ambulate at least 150 feet consistently with improved heel-to-toe gait pattern. PT Short Term Goal 4 (Week 1): Pt will initiate stair training.  Skilled Therapeutic Interventions/Progress Updates:    Patient received sitting edge of bed finishing breakfast. RN present to provide AM rx. He is agreeable to PT and reports pain in B feet and buttocks (did not rate) and requested that pain rx be provided after session. PT providing rest breaks, distractions and repositioning to assist with pain management. Patient standing up from elevated bed height with RW and CGA. He ambulated to therapy gym with RW and CGA. Unable to negotiate 6" steps due to B LE proximal weakness. Negotiating 6 3" steps, step to pattern, 4x6 with B UE and CGA. Patient becoming extremely frustrated and borderline verbally abusive toward PT when it was suggested that he take a seated rest break after 2 bouts up the steps due to SOB. PT attempting to explain the importance of therapeutic rest breaks- patient not receptive to education. Patient requiring up to MaxA to stand from standard height chair. Patient ambulating to dayroom with RW and CGA. NuStep 2x41mins- patient again, very frustrated with the rest break. HR 99BPM, O2 100%. Patient ambulating back to his room with RW and CGA. Upon sitting down, patient stating he wanted to lay down. PT offering to assist patient. Patient stating "I can do everything myself." PT stating that patient that he can go and lay down then. Patient  then stating "fine, I'll ask the nurse." Patient with learned helplessness behaviors that greatly impact his ability to not only participate in therapy, but progress with therapy. Patient remaining sitting edge of bed, bed alarm on, call light within reach, RN aware of patients location.    Therapy Documentation Precautions:  Precautions Precautions: Fall Precaution Comments: R temporary femoral dialysis catheter removed 06/20/21, low activity tolerance, L lateral heel and foot wound, sacral wound Restrictions Weight Bearing Restrictions: No    Therapy/Group: Individual Therapy  Karoline Caldwell, PT, DPT, CBIS  06/28/2021, 7:39 AM

## 2021-06-28 NOTE — Progress Notes (Signed)
Patient ID: Jonathan Mcmillan, male   DOB: 03/31/1996, 25 y.o.   MRN: [id number]  Team feels with pt not cooperating with therapies asked MD if can discharge him sooner than 2/21. MD feels can discharge tomorrow if Op-HD set up and it has, can begin Friday at 11:00 for paperwork and chair time is 11:45. MD has called Tammy-Aunt and spoke with her and she is agreeable to this and will be here tomorrow after 1:00 pm. Have put [employer] in for pt's medication assistance and trying to get charity care for home health. Bedside RN will need to do wound care education with Aunt when here tomorrow. Will order equipment and work on discharge for tomorrow.

## 2021-06-28 NOTE — Plan of Care (Signed)
Problem: RH Problem Solving °Goal: LTG Patient will demonstrate problem solving for (SLP) °Description: LTG:  Patient will demonstrate problem solving for basic/complex daily situations with cues  (SLP) °Outcome: Not Met (add Reason) °Note: Patient continues to require Max A for safe decision making/problem solving with functional tasks °  °Problem: RH Awareness °Goal: LTG: Patient will demonstrate awareness during functional activites type of (SLP) °Description: LTG: Patient will demonstrate awareness during functional activites type of (SLP) °Outcome: Not Met (add Reason) °Note: Patient continues to require Max A for safe decision making/problem solving with functional tasks °  °

## 2021-06-28 NOTE — Progress Notes (Signed)
Inpatient Rehabilitation Care Coordinator Discharge Note   Patient Details  Name: Jonathan Mcmillan MRN: [id number] Date of Birth: 12/15/1995   Discharge location: HOME WITH AUNT AND OTHER FAMILY MEMBERS  Length of Stay: 7 DAYS  Discharge activity level: SUPERVISION LEVEL  Home/community participation: ACTIVE  Patient response QV:ZDGLOV Literacy - How often do you need to have someone help you when you read instructions, pamphlets, or other written material from your doctor or pharmacy?: Never  Patient response FI:EPPIRJ Isolation - How often do you feel lonely or isolated from those around you?: Rarely  Services provided included: MD, RD, PT, OT, SLP, RN, CM, Pharmacy, SW  Financial Services:  Financial Services Utilized: Other (Comment) (SELF PAY APPLYING FOR MEDICAID)    Choices offered to/list presented to: PT AND AUNT  Follow-up services arranged:  Home Health, DME Home Health Agency: [place]    DME : [facility] AND 3 IN 1 [place] M, W, F CHAIR TIME 11:45 AM. Percell Miller [phone] [place] WITH PRESCRIPTIONS    Patient response to transportation need: Is the patient able to respond to transportation needs?: Yes In the past 12 months, has lack of transportation kept you from medical appointments or from getting medications?: No In the past 12 months, has lack of transportation kept you from meetings, work, or from getting things needed for daily living?: No    Comments (or additional information): AUNT WAS [place], SHE FELT IT NOT NECESSARY TO COME IN AND DO FORMAL TRAINING. PT DID NOT WANT TO PARTICIPATE IN THE PROGRAM AND AUNT AWARE OF THIS. DC HIM EARLIER THAN EXPECTED DUE TO THIS ISSUE.   Patient/Family verbalized understanding of follow-up arrangements:  Yes  Individual responsible for coordination of the follow-up plan: TAMMIE-AUNT  [phone]  Confirmed correct DME delivered: Elease Hashimoto 06/28/2021    Athalee Esterline, Gardiner Rhyme

## 2021-06-28 NOTE — Progress Notes (Signed)
Speech Language Pathology Discharge Summary  Patient Details  Name: Jonathan Mcmillan MRN: [id number] Date of Birth: 1995-11-06  Today's Date: 06/28/2021 SLP Individual Time: 2244-9753 SLP Individual Time Calculation (min): 25 min   Skilled Therapeutic Interventions:  Skilled treatment session focused on cognitive goals. Upon arrival, patient was sitting upright in the wheelchair and had apparently turned off his bed alarm and transferred himself into the wheelchair. Patient requested help pulling up his pants but wanted assistance from the NT. Patient then requested to sit EOB and reported frustration due to not receiving his breakfast tray yet. SLP provided his tray and patient upset regarding his food being cold. Patient ate half of his current tray and then called on his phone independently to request another tray . SLP attempted to engage patient in functional conversation regarding current deficits with patient becoming verbally frustrated with SLP. Patient reporting no cognitive changes and reports he can do everything for himself independently at discharge. Patient's second tray arrived and patient appreciative of SLP bringing into his room. Patient left patient sitting EOB with tray present and all needs within reach.   Patient has met  0 of  2 long term goals.  Patient to discharge at overall Max level.   Reasons goals not met: Patient continues to be noncompliant with minimal participation and requires overall Max A multimodal cues safety awareness and problem solving. Due to patient's noncompliance and poor participation, patient is now discharging home earlier than originally scheduled.   Clinical Impression/Discharge Summary: Patient has not made any functional gains and has not met any LTGs this admission. Currently, patient continues to demonstrate noncompliance with poor participation, poor safety awareness and impaired problem solving. However, suspect this is due to baseline  personality traits vs true cognitive impairments.  Due to issues mentioned above, patient will discharge home earlier than anticipated. Patient is minimally receptive to education and patient's aunt felt that she did not need to participate in family education at this time. Due to patient being at his cognitive baseline, f/u is not warranted at this time.   Care Partner:  Caregiver Able to Provide Assistance: Yes    Recommendation:  None     Equipment: N/A   Reasons for discharge: Discharged from hospital   Patient/Family Agrees with Progress Made and Goals Achieved: Yes    [place], [place] 06/28/2021, 3:36 PM

## 2021-06-28 NOTE — Progress Notes (Signed)
Pts bed alarm sounded, Courtney LPN found the pt standing up in the room, going through his bag. Pt was educated about safety protocol, but stated " I can do this on my own, I need to be more independent". Pt was encouraged to get back in the bed, and wait for staff assistance  but continued to refuse.   This nurse went to find the patient sitting in the wheelchair, with out clothes on, attempting to take off his pants, this nurse aided him and reeducated the pt on proper protocol, pt " I dont care I want to get ready". Pt refusing to get back in the bed. Provider will be made aware of pts refusal to follow rules.

## 2021-06-28 NOTE — Discharge Instructions (Addendum)
Inpatient Rehab Discharge Instructions  Jonathan Mcmillan Discharge date and time: 06/29/2021 Activities/Precautions/ Functional Status: Activity: no lifting, driving, or strenuous exercise for until cleared by MD  Diet: diabetic diet  Wound Care: as directed  Functional status:  ___ No restrictions     ___ Walk up steps independently ___ 24/7 supervision/assistance   ___ Walk up steps with assistance ___ Intermittent supervision/assistance  ___ Bathe/dress independently ___ Walk with walker     ___ Bathe/dress with assistance ___ Walk Independently    ___ Shower independently ___ Walk with assistance    __x_ Shower with assistance _x__ No alcohol     ___ Return to work/school ________  Special Instructions:  No driving, alcohol consumption or tobacco use.   My questions have been answered and I understand these instructions. I will adhere to these goals and the provided educational materials after my discharge from the hospital.  Patient/Caregiver Signature _______________________________ Date __________  Clinician Signature _______________________________________ Date __________  Please bring this form and your medication list with you to all your follow-up doctor's appointments.      Corrin Parker, MS, RD, LDN Clinical Dietitian Office phone [phone]  COMMUNITY REFERRALS UPON DISCHARGE:    Home Health:   PT & RN                 [facility] Phone: [phone]   Medical Equipment/Items [place] 3 IN 1                                                 Agency/Supplier:[facility]   [phone]  [place] M, W, F 11:45 [place] [phone]

## 2021-06-28 NOTE — Progress Notes (Signed)
Contacted by CIR staff this am regarding pt's d/c tomorrow. Spoke to Will at [employer]. Will confirms pt's schedule of MWF with 11:45 chair time. Will aware pt will d/c tomorrow and will start at clinic on Friday. Pt will need to arrive at 11:00 on Friday to complete paperwork prior to treatment. Navigator will fax d/c summary to clinic as soon as it is available. Update provided to [facility] staff regarding the above arrangements. Attempted to reach pt's aunt, Lynelle Smoke, to discuss the above. Message left requesting a return call. Will add out-pt HD arrangements to pt's AVS.   Melven Sartorius Renal Navigator [phone]

## 2021-06-29 ENCOUNTER — Other Ambulatory Visit (HOSPITAL_COMMUNITY): Payer: Self-pay

## 2021-06-29 LAB — GLUCOSE, CAPILLARY
Glucose-Capillary: 164 mg/dL — ABNORMAL HIGH (ref 70–99)
Glucose-Capillary: 111 mg/dL — ABNORMAL HIGH (ref 70–99)

## 2021-06-29 MED ORDER — ACCU-CHEK SOFTCLIX LANCETS MISC
0 refills | Status: DC
  Filled 2021-06-29: qty 100, 25d supply, fill #0

## 2021-06-29 MED ORDER — INSULIN PEN NEEDLE 32G X 4 MM MISC
0 refills | Status: DC
  Filled 2021-06-29: qty 100, 30d supply, fill #0

## 2021-06-29 MED ORDER — BLOOD GLUCOSE MONITOR SYSTEM W/DEVICE KIT
PACK | 0 refills | Status: DC
  Filled 2021-06-29: qty 1, 30d supply, fill #0

## 2021-06-29 MED ORDER — GLUCOSE BLOOD VI STRP
ORAL_STRIP | 0 refills | Status: DC
  Filled 2021-06-29: qty 100, 30d supply, fill #0

## 2021-06-29 NOTE — Progress Notes (Signed)
Occupational Therapy Session Note  Patient Details  Name: Jonathan Mcmillan MRN: [id number] Date of Birth: 1995/06/18  Today's Date: 06/29/2021 OT Individual Time: 5643-3295 OT Individual Time Calculation (min): 45 min    Short Term Goals: Week 1:  OT Short Term Goal 1 (Week 1): Pt will be able to don pants with min A or less. OT Short Term Goal 2 (Week 1): Pt will be able to consistently complete sit to stands with mod A or less. OT Short Term Goal 3 (Week 1): Pt will complete squat pivot transfers to toilet with min A. OT Short Term Goal 4 (Week 1): Pt will engage in room HEP for his UEs in between therapy sessions.  Skilled Therapeutic Interventions/Progress Updates:    Pt received in room in bed. He reported that he was able to stand up by himself from toilet and use the bathroom by himself this morning, he also got dressed and got his shoes on himself today.   Pt participated in his HEP using theraband and exercise putty.  Pt provided with printed HEP for home.   Pt states he is eager to go home.     Therapy Documentation Precautions:  Precautions Precautions: Fall Precaution Comments: R temporary femoral dialysis catheter removed 06/20/21, low activity tolerance, L lateral heel and foot wound, sacral wound Restrictions Weight Bearing Restrictions: No    Pain: Pain Assessment Pain Scale: 0-10 Pain Score: 7  Pain Type: Chronic pain Pain Location: Buttocks Pain Descriptors / Indicators: Aching Pain Frequency: Constant Pain Onset: On-going Pain Intervention(s): Medication (See eMAR) ADL: ADL Eating: Independent Grooming: Setup Upper Body Bathing: Setup Lower Body Bathing: Supervision/safety Upper Body Dressing: Independent Lower Body Dressing: Setup Toileting: Supervision/safety Toilet Transfer: Close supervision Toilet Transfer Method: Counselling psychologist: Raised toilet seat, Grab bars Balance Balance Assessed: Yes Static Sitting Balance Static  Sitting - Balance Support: Feet supported Static Sitting - Level of Assistance: 7: Independent Dynamic Sitting Balance Dynamic Sitting - Balance Support: During functional activity Dynamic Sitting - Level of Assistance: 6: Modified independent (Device/Increase time) Static Standing Balance Static Standing - Balance Support: Bilateral upper extremity supported Static Standing - Level of Assistance: 5: Stand by assistance Dynamic Standing Balance Dynamic Standing - Balance Support: During functional activity;Bilateral upper extremity supported Dynamic Standing - Level of Assistance: 5: Stand by assistance  Therapy/Group: Individual Therapy  [place] 06/29/2021, 12:36 PM

## 2021-06-29 NOTE — Progress Notes (Addendum)
Occupational Therapy Discharge Summary  Patient Details  Name: Jonathan Mcmillan MRN: [id number] Date of Birth: [birth year]     Patient has met 8 of 8 long term goals due to improved activity tolerance, improved balance, and ability to compensate for deficits.  Patient to discharge at overall Supervision level.  Patient's care partner is independent to provide the necessary physical and cognitive assistance at discharge.   Pt education only. Provided with HEP handout, therabands and putty. No family education as aunt did not feel she needed to come in.   Reasons goals not met: n/a  Recommendation:  No further OT services recommended, pt will benefit from [place].  Equipment: Bedside commode  Reasons for discharge: treatment goals met  Patient/family agrees with progress made and goals achieved: Yes  OT Discharge Precautions/Restrictions  Precautions Precautions: Fall Precaution Comments: R temporary femoral dialysis catheter removed 06/20/21, low activity tolerance, L lateral heel and foot wound, sacral wound Restrictions Weight Bearing Restrictions: No  ADL ADL Eating: Independent Grooming: Setup Upper Body Bathing: Setup Lower Body Bathing: Supervision/safety Upper Body Dressing: Independent Lower Body Dressing: Setup Toileting: Supervision/safety Toilet Transfer: Close supervision Toilet Transfer Method: Counselling psychologist: Raised toilet seat, Grab bars - unable to shower due to new dialysis port Vision Baseline Vision/History: 0 No visual deficits Patient Visual Report: No change from baseline Vision Assessment?: No apparent visual deficits Perception  Perception: Within Functional Limits Praxis Praxis: Intact Cognition Overall Cognitive Status: No family/caregiver present to determine baseline cognitive functioning Arousal/Alertness: Awake/alert Orientation Level: Oriented X4 Year: 2023 Month: February Day of Week: Correct Selective  Attention: Impaired Selective Attention Impairment: Functional basic;Verbal basic Memory: Impaired Immediate Recall: sock, blue, bed Memory Recall Sock: Without Cue Memory Recall Blue: Without Cue Memory Recall Bed: Without Cue Awareness: Impaired Awareness Impairment: Anticipatory impairment;Emergent impairment;Other (comment) Problem Solving: Impaired Problem Solving Impairment: Verbal basic;Functional basic Sensation Sensation Light Touch: Appears Intact Hot/Cold: Appears Intact Proprioception: Appears Intact Stereognosis: Appears Intact Coordination Gross Motor Movements are Fluid and Coordinated: No Fine Motor Movements are Fluid and Coordinated: No Coordination and Movement Description: generalized weakness and coordination Heel Shin Test: unable to perform hip flexion while seated bilaterally Motor  Motor Motor: Other (comment) Motor - Skilled Clinical Observations: severe deconditioning and declines in strength rapidly with movement Motor - Discharge Observations: generalized weakness Mobility    Close Supervision with RW to ambulate to bathroom Trunk/Postural Assessment  Cervical Assessment Cervical Assessment: Within Functional Limits Thoracic Assessment Thoracic Assessment: Within Functional Limits Lumbar Assessment Lumbar Assessment: Exceptions to [facility] Postural Control Postural Control: Deficits on evaluation  Balance Balance Balance Assessed: Yes Static Sitting Balance Static Sitting - Balance Support: Feet supported Static Sitting - Level of Assistance: 7: Independent Dynamic Sitting Balance Dynamic Sitting - Balance Support: During functional activity Dynamic Sitting - Level of Assistance: 6: Modified independent (Device/Increase time) Static Standing Balance Static Standing - Balance Support: Bilateral upper extremity supported Static Standing - Level of Assistance: 5: Stand by assistance Dynamic Standing Balance Dynamic Standing - Balance Support:  During functional activity;Bilateral upper extremity supported Dynamic Standing - Level of Assistance: 5: Stand by assistance Extremity/Trunk Assessment RUE Assessment General Strength Comments: 3+/5, grasp strength 45 lbs LUE Assessment General Strength Comments: 3+/5, grasp strength 45 lbs   Shevawn Langenberg 06/29/2021, 12:44 PM

## 2021-06-29 NOTE — Progress Notes (Signed)
Physical Therapy Discharge Summary  Patient Details  Name: Jonathan Mcmillan MRN: [id number] Date of Birth: November 30, 1995  Today's Date: 06/29/2021 PT Individual Time: 4917-9150 PT Individual Time Calculation (min): 38 min   Daily session: Patient received supine in bed asleep, easy to wake. He reports 7/10 pain primarily in his buttocks. RN notified and will provide pain rx after session. PT providing rest breaks, distractions and repositioning to assist with pain management. PT adjusting patients new walker to appropriate height for dc. He ambulated to orthogym with RW and supervision. Car transfer with supervision and education on use of leg loops. Patient able to ambulate over uneven terrain with RW and supervision. Patient ambulating to dayroom with RW and supervision. NuStep x10 mins with BUE/LE on level 2. Patient ambulating back to his room with RW and supervision. Remaining edge of bed, per patient request. Bed alarm on, call light within reach, RN at bedside.    Patient has met 7  of 8 long term goals due to improved activity tolerance, improved balance, improved postural control, increased strength, decreased pain, and ability to compensate for deficits.  Patient to discharge at an ambulatory level [place].   Patient's care partner unavailable to provide the necessary physical and cognitive assistance at discharge. Family refusing to participate in family education despite it being strongly recommended prior to dc.   Reasons goals not met: Patient unable to negotiate typical 6" steps due to proximal BLE weakness and limited effort put forth in therapy to improve upon this prior to dc.   Recommendation:  Patient will benefit from ongoing skilled PT services in home health setting to continue to advance safe functional mobility, address ongoing impairments in proximal strength, gait progressions, dynamic balance, and minimize fall risk.  Equipment: RW  Reasons for discharge: discharge  from hospital  Patient/family agrees with progress made and goals achieved: Yes  PT Discharge Precautions/Restrictions Precautions Precautions: Fall Precaution Comments: R temporary femoral dialysis catheter removed 06/20/21, low activity tolerance, L lateral heel and foot wound, sacral wound Restrictions Weight Bearing Restrictions: No Pain Pain Assessment Pain Scale: 0-10 Pain Score: 9  Pain Type: Chronic pain Pain Location: Buttocks Pain Descriptors / Indicators: Aching Pain Frequency: Constant Pain Onset: On-going Pain Intervention(s): Medication (See eMAR) Pain Interference Pain Interference Pain Effect on Sleep: 2. Occasionally Pain Interference with Therapy Activities: 2. Occasionally Pain Interference with Day-to-Day Activities: 2. Occasionally Vision/Perception  Vision - History Ability to See in Adequate Light: 0 Adequate Perception Perception: Within Functional Limits Praxis Praxis: Intact  Cognition Overall Cognitive Status: No family/caregiver present to determine baseline cognitive functioning Arousal/Alertness: Awake/alert Orientation Level: Oriented X4 Selective Attention: Impaired Memory: Impaired Awareness: Impaired Problem Solving: Impaired Safety/Judgment: Impaired Sensation Sensation Light Touch: Appears Intact Hot/Cold: Appears Intact Proprioception: Appears Intact Stereognosis: Appears Intact Coordination Gross Motor Movements are Fluid and Coordinated: No Fine Motor Movements are Fluid and Coordinated: No Coordination and Movement Description: generalized weakness and coordination Heel Shin Test: unable to perform hip flexion while seated bilaterally Motor  Motor Motor: Other (comment) Motor - Skilled Clinical Observations: severe deconditioning and declines in strength rapidly with movement Motor - Discharge Observations: generalized weakness  Mobility Bed Mobility Bed Mobility: Rolling Right;Rolling Left;Sit to Supine;Supine to  Sit Rolling Right: Independent Rolling Left: Independent Supine to Sit: Independent Sit to Supine: Minimal Assistance - Patient > 75% Transfers Transfers: Sit to Stand;Stand to Sit;Stand Pivot Transfers Sit to Stand: Supervision/Verbal cueing (patient has been transferring himself without staff present despite safety protocols) Stand to  Sit: Supervision/Verbal cueing (patient has been transferring himself without staff present despite safety protocols) Stand Pivot Transfers: Supervision/Verbal cueing (patient has been transferring himself without staff present despite safety protocols) Transfer (Assistive device): Rolling walker Locomotion  Gait Ambulation: Yes Gait Assistance: Supervision/Verbal cueing Gait Distance (Feet): 300 Feet Assistive device: Rolling walker Gait Gait: Yes Gait Pattern: Impaired Gait Pattern: Step-through pattern;Decreased stride length;Decreased hip/knee flexion - right;Decreased hip/knee flexion - left;Right foot flat;Left foot flat Gait velocity: decreased Stairs / Additional Locomotion Stairs: Yes Stairs Assistance: Contact Guard/Touching assist Stair Management Technique: Two rails Height of Stairs: 3 Wheelchair Mobility Wheelchair Mobility: Yes Wheelchair Assistance: Independent with Camera operator: Both upper extremities Wheelchair Parts Management: Independent  Trunk/Postural Assessment  Cervical Assessment Cervical Assessment: Within Water engineer Thoracic Assessment: Within Functional Limits Lumbar Assessment Lumbar Assessment: Exceptions to [facility] Postural Control Postural Control: Deficits on evaluation  Balance Balance Balance Assessed: Yes Static Sitting Balance Static Sitting - Balance Support: Feet supported Static Sitting - Level of Assistance: 7: Independent Dynamic Sitting Balance Dynamic Sitting - Balance Support: During functional activity Dynamic Sitting - Level of Assistance:  6: Modified independent (Device/Increase time) Static Standing Balance Static Standing - Balance Support: Bilateral upper extremity supported Static Standing - Level of Assistance: 5: Stand by assistance Dynamic Standing Balance Dynamic Standing - Balance Support: During functional activity;Bilateral upper extremity supported Dynamic Standing - Level of Assistance: 5: Stand by assistance Extremity Assessment      RLE Assessment RLE Assessment: Exceptions to [facility] RLE Strength RLE Overall Strength: Deficits Right Hip Flexion: 2/5 Right Hip Extension: 3-/5 Right Hip ABduction: 3+/5 Right Hip ADduction: 3+/5 Right Knee Flexion: 3-/5 Right Knee Extension: 4-/5 Right Ankle Dorsiflexion: 4-/5 Right Ankle Plantar Flexion: 4-/5 LLE Assessment LLE Assessment: Exceptions to [facility] LLE Strength LLE Overall Strength: Deficits Left Hip Flexion: 2/5 Left Hip Extension: 3-/5 Left Hip ABduction: 3+/5 Left Hip ADduction: 3+/5 Left Knee Flexion: 3-/5 Left Knee Extension: 4-/5 Left Ankle Dorsiflexion: 4-/5 Left Ankle Plantar Flexion: 4-/5    Debbora Dus 06/29/2021, 8:00 AM

## 2021-06-29 NOTE — Progress Notes (Signed)
[facility] Progress Note   Subjective:  patient seen and examined bedside. No acute events. Tolerated hd yesterday with net UF 3500. He reports that his swelling has improved. He's upset about being discharged today  Objective Vitals:   06/28/21 1744 06/28/21 2005 06/29/21 0500 06/29/21 0600  BP: 126/81 127/68  (!) 141/74  Pulse: 88 (!) 103  96  Resp: 18 18  16   Temp: 98.7 F (37.1 C) 99.8 F (37.7 C)  98.6 F (37 C)  TempSrc:  Oral  Oral  SpO2:  100%  100%  Weight: 69.6 kg  70.5 kg   Height:       Physical Exam General:Alert, chronically ill appearing male in NAD Heart:RRR Lungs:CTA b/l Abdomen:soft, NTND Extremities: trace edema b/l LEs Dialysis Access: R [facility]   Filed Weights   06/28/21 1429 06/28/21 1744 06/29/21 0500  Weight: 72.8 kg 69.6 kg 70.5 kg    Intake/Output Summary (Last 24 hours) at 06/29/2021 0848 Last data filed at 06/29/2021 0200 Gross per 24 hour  Intake 720 ml  Output 3500 ml  Net -2780 ml    Additional Objective Labs: Basic Metabolic Panel: Recent Labs  Lab 06/23/21 1421 06/26/21 0623 06/28/21 1309  NA 136 138 138  K 5.0 5.4* 4.7  CL 102 102 102  CO2 25 27 25   GLUCOSE 179* 159* 195*  BUN 27* 36* 33*  CREATININE 4.37* 5.15* 5.01*  CALCIUM 6.2* 6.7* 7.0*  PHOS 6.2* 6.3* 6.2*   Liver Function Tests: Recent Labs  Lab 06/23/21 1421 06/26/21 0623 06/28/21 1309  ALBUMIN <1.5* <1.5* <1.5*   CBC: Recent Labs  Lab 06/22/21 2019 06/23/21 0950 06/23/21 1421 06/26/21 0623 06/28/21 1309  WBC 11.1* 10.9* 12.1* 12.6* 13.6*  NEUTROABS  --   --   --  9.3*  --   HGB 6.6* 7.3* 6.7* 8.5* 8.1*  HCT 20.7* 22.8* 21.1* 25.9* 25.9*  MCV 92.4 93.8 94.2 91.8 93.8  PLT 54* 62* 56* 64* 86*    CBG: Recent Labs  Lab 06/27/21 2121 06/28/21 0645 06/28/21 1214 06/28/21 2052 06/29/21 0601  GLUCAP 169* 104* 166* 167* 164*   Iron Studies: No results for input(s): IRON, TIBC, TRANSFERRIN, FERRITIN in the last 72 hours. Lab  Results  Component Value Date   INR 1.3 (H) 06/04/2021   INR 2.3 (H) 06/01/2021   INR 1.3 (H) 05/31/2021   Studies/Results: No results found.  Medications:   (feeding supplement) PROSource Plus  30 mL Oral TID BM   sodium chloride   Intravenous Once   amiodarone  200 mg Oral Daily   vitamin C  500 mg Oral BID   calcitRIOL  0.5 mcg Oral Daily   calcium carbonate  1 tablet Oral TID   carvedilol  3.125 mg Oral BID WC   Chlorhexidine Gluconate Cloth  6 each Topical Q0600   cinacalcet  30 mg Oral Q breakfast   collagenase   Topical Daily   darbepoetin (ARANESP) injection - DIALYSIS  60 mcg Intravenous Q Fri-HD   ferrous sulfate  325 mg Oral Q breakfast   hydrocerin   Topical Daily   insulin aspart  0-5 Units Subcutaneous QHS   insulin aspart  0-6 Units Subcutaneous TID WC   lipase/protease/amylase  36,000 Units Oral TID WC   loperamide  2 mg Oral BID   multivitamin  1 tablet Oral QHS   pantoprazole  40 mg Oral Daily   sacubitril-valsartan  1 tablet Oral BID   thiamine  100  mg Oral Daily    Dialysis Orders: set up for OP HD on MWF schedule w/ [facility]   Assessment/ Plan: Debility - In CIR sent from [facility] ESRD - new start to HD, 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 Pt rec'd CRRT for 1 week then was transitioned to iHD. [place] placed on 2/03. Does not have AVF yet. Will need outpatient referral for permanent access placement as he will d/c today. CLIP: accepted to [facility] MWF 11:45am chair. Next HD 2/17 if still here Atrial fib-per primary Volume - some abd wall edema and pedal edema. UF as tolerated.  Continue education about fluid restrictions.  Anemia ckd - Hb low in the 7-8 range, tsat was 45%, ordered aranesp 60 mcg weekly on fridays, 1st dose was 2/10.  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  Can start dialysis on Friday at outpatient unit.  Gean Quint, MD [facility] 06/29/2021,8:48 AM  LOS: 7 days

## 2021-06-29 NOTE — Progress Notes (Signed)
Pt continues to turn off the bed alarm, despite the safety protocol. Pt also continues to demand additional food,crackers, and grape juice. Pt educated on the proper diet, and continues to eat things that are not appropriate.

## 2021-06-29 NOTE — Progress Notes (Signed)
Spoke to pt's aunt, Tammy, via phone this am. Elenor Legato is aware of pt's out-pt HD clinic/schedule. Aunt aware pt will need to be at clinic tomorrow at 11:00 to complete paperwork prior to 11:45 chair time. Aunt voices understanding to plan. HD clinic and schedule placed on pt's AVS. Contacted clinic today to say that navigator will fax d/c summary once completed by provider.   Melven Sartorius Renal Navigator [phone]

## 2021-06-29 NOTE — Progress Notes (Signed)
PROGRESS NOTE   Subjective/Complaints:  Pt at Sup level Yelling for pain meds this am  Discussed d/c with pt and his aunt Tammy via phone yesterday  ROS: limited due to cognition/behavior   Objective:   No results found. Recent Labs    06/28/21 1309  WBC 13.6*  HGB 8.1*  HCT 25.9*  PLT 86*    Recent Labs    06/28/21 1309  NA 138  K 4.7  CL 102  CO2 25  GLUCOSE 195*  BUN 33*  CREATININE 5.01*  CALCIUM 7.0*     Intake/Output Summary (Last 24 hours) at 06/29/2021 0735 Last data filed at 06/29/2021 0200 Gross per 24 hour  Intake 840 ml  Output 3500 ml  Net -2660 ml      Pressure Injury 06/01/21 Buttocks Right;Lower Stage 2 -  Partial thickness loss of dermis presenting as a shallow open injury with a red, pink wound bed without slough. (Active)  06/01/21 1030  Location: Buttocks  Location Orientation: Right;Lower  Staging: Stage 2 -  Partial thickness loss of dermis presenting as a shallow open injury with a red, pink wound bed without slough.  Wound Description (Comments):   Present on Admission: Yes    Physical Exam: Vital Signs Blood pressure (!) 141/74, pulse 96, temperature 98.6 F (37 C), temperature source Oral, resp. rate 16, height 5\' 8"  (1.727 m), weight 70.5 kg, SpO2 100 %.    General: No acute distress Mood and affect are appropriate Heart: Regular rate and rhythm no rubs murmurs or extra sounds Lungs: Clear to auscultation, breathing unlabored, no rales or wheezes Abdomen: Positive bowel sounds, soft nontender to palpation, nondistended Extremities: No clubbing, cyanosis, or edema Ext 1+ pre tib and 2+ pedal edema , no UE edema , no tenderness to palpation  Neuro:  Pt alert, oriented to self, hospital. Follows basic commands. Normal language, speech low volume. Fair insight. Good bed mobility. Motor 5/5 in BUE and 4/5 BLE . Senses pain and light touch with sl decreased distally    Musculoskeletal: Full ROM, No pain with AROM or PROM in the neck, trunk, or extremities. Posture appropriate     Assessment/Plan: 1. Functional deficits  Stable for D/C today F/u PCP in 3-4 weeks F/u PM&R 2 weeks See D/C summary See D/C instructions  Care Tool:  Bathing    Body parts bathed by patient: Front perineal area, Buttocks   Body parts bathed by helper: Buttocks, Right upper leg, Left upper leg, Right lower leg, Left lower leg     Bathing assist Assist Level: Moderate Assistance - Patient 50 - 74%     Upper Body Dressing/Undressing Upper body dressing   What is the patient wearing?: Pull over shirt    Upper body assist Assist Level: Contact Guard/Touching assist    Lower Body Dressing/Undressing Lower body dressing      What is the patient wearing?: Pants, Underwear/pull up     Lower body assist Assist for lower body dressing: Moderate Assistance - Patient 50 - 74%     Toileting Toileting    Toileting assist Assist for toileting: Supervision/Verbal cueing     Transfers Chair/bed transfer  Transfers assist  Chair/bed transfer assist level: Supervision/Verbal cueing     Locomotion Ambulation   Ambulation assist      Assist level: Contact Guard/Touching assist Assistive device: Walker-rolling Max distance: 224ft   Walk 10 feet activity   Assist     Assist level: Minimal Assistance - Patient > 75% Assistive device: Walker-rolling   Walk 50 feet activity   Assist    Assist level: Minimal Assistance - Patient > 75% Assistive device: Walker-rolling    Walk 150 feet activity   Assist    Assist level: Minimal Assistance - Patient > 75% Assistive device: Walker-rolling    Walk 10 feet on uneven surface  activity   Assist Walk 10 feet on uneven surfaces activity did not occur: Safety/medical concerns         Wheelchair     Assist Is the patient using a wheelchair?: Yes Type of Wheelchair: Manual     Wheelchair assist level: Total Assistance - Patient < 25% Max wheelchair distance: 150 ft    Wheelchair 50 feet with 2 turns activity    Assist        Assist Level: Total Assistance - Patient < 25%   Wheelchair 150 feet activity     Assist      Assist Level: Total Assistance - Patient < 25%   Blood pressure (!) 141/74, pulse 96, temperature 98.6 F (37 C), temperature source Oral, resp. rate 16, height 5\' 8"  (1.727 m), weight 70.5 kg, SpO2 100 %.  Medical Problem List and Plan: 1. Functional deficits secondary to anoxic brain injury from  renal failure, heart failure, PEA arrest              -patient may not shower due to HD catheter             -ELOS/Goals: 07/04/21 mod I/supervision  -  2.  Antithrombotics: -DVT/anticoagulation:  Pharmaceutical: Lovenox             -antiplatelet therapy: none 3. Pain Management: Tylenol, oxycodone  -appears controlled at present  2/12- pain controlled- denies- con't regimen 4. Mood: LCSW to evaluate and provide emotional support             -antipsychotic agents: n/a 5. Neuropsych: This patient is? (Poor memory) capable of making decisions on his own behalf. 6. Skin/Wound Care: Sacral and Left heel unstageable/ decubitus ulcer wound care:   -air  mattress, appropriate w/c cushion -WOC nurse consult- santyl to sacral wound -foam dressings for feet.              --Eucerin for dry skin  7. Fluids/Electrolytes/Nutrition: Fluid restriction 1200 mL daily             --per nephrology             --renal/carb modified diet. Continue supplements 8: ESRD on iHD via right IJ TDC on MWF schedule 9: DM 1: Continue Semglee 5 units at bedtime, SSI meal/HS coverage; CBGs- no oral agents due to type 1 DM  CBG (last 3)  Recent Labs    06/28/21 1214 06/28/21 2052 06/29/21 0601  GLUCAP 166* 167* 164*     Controlled 2/16 10: Heart failure: Continue Entresto BID, Coreg- L>R pedal edema should improve with HD, may need compression hose   11: Atrial fibrillation: Continue amiodarone BID; ? need for AC 12: Hypertension: continue Coreg   Vitals:   06/28/21 2005 06/29/21 0600  BP: 127/68 (!) 141/74  Pulse: (!) 103 96  Resp: 18 16  Temp:  99.8 F (37.7 C) 98.6 F (37 C)  SpO2: 100% 100%   Controlled 2/16 13: Anemia, chronic disease: Is getting iron supplementation  2/10 hgb up to 7.3 today--recheck Monday 14: Tobacco use: provide cessation counseling             --? Nicotine patch 15: GERD: Continue PPI 16: Thrombocytopenia: Continue to monitor platelet count             --No signs of bleeding  2/10 platelets 62k (54 previously) 17: Aspiration pneumonia: treated and resolved   19: Diarrhea: resolved , continent  20. Leukocytosis  2/13 stable at 12.6  21.  Hypoalbuminemia-nephrotic syndrome , TED hose for LE edema, diet compliance an issue in regards to Na+     LOS: 7 days A FACE TO FACE EVALUATION WAS PERFORMED  Charlett Blake 06/29/2021, 7:35 AM

## 2021-06-29 NOTE — Progress Notes (Signed)
INPATIENT REHABILITATION DISCHARGE NOTE   Discharge instructions by: Katharine Look PA  Verbalized understanding:yes  Skin care/Wound care healing?pt discharging with wound dressing twice a day; Patient's aunt instructed on wound care  Pain:pain 8-9/10  IV's:none  Tubes/Drains:HD cath  O2:none  Safety instructions:done  Patient belongings:done  Discharged CX:[id number]   Discharged EHU:[id number]  Notes:Noted some odor when changing dressing but patient has been refusing a lot of things. Refusing wound to be seen by PA before discharge claims his aunt knows how to do dressing and the odor that was noted was when he farted. CN notified.

## 2021-06-29 NOTE — Progress Notes (Signed)
Inpatient Rehabilitation Discharge Medication Review by a Pharmacist  A complete drug regimen review was completed for this patient to identify any potential clinically significant medication issues.  High Risk Drug Classes Is patient taking? Indication by Medication  Antipsychotic No   Anticoagulant No   Antibiotic No   Opioid Yes Oxy for Sacral and right heel decubitus ulcer   Antiplatelet No   Hypoglycemics/insulin Yes SSI for Type 1 DM  Vasoactive Medication Yes Coreg, Entresto, amiodarone for afib and low EF  Chemotherapy No   Other Yes Robaxin - spasms     Clinically significant medication issues were identified that warrant physician communication and completion of prescribed/recommended actions by midnight of the next day:  No  Time spent performing this drug regimen review (minutes):  15 min  Onnie Boer, PharmD, [place], AAHIVP, CPP Infectious Disease Pharmacist 06/29/2021 8:36 AM

## 2021-06-29 NOTE — Plan of Care (Signed)
Problem: RH Stairs Goal: LTG Patient will ambulate up and down stairs w/assist (PT) Description: LTG: Patient will ambulate up and down # of stairs with assistance (PT) Outcome: Not Met (add Reason)   Problem: RH Stairs Goal: LTG Patient will ambulate up and down stairs w/assist (PT) Description: LTG: Patient will ambulate up and down # of stairs with assistance (PT) Outcome: Not Met (add Reason)   Problem: RH Balance Goal: LTG Patient will maintain dynamic standing balance (PT) Description: LTG:  Patient will maintain dynamic standing balance with assistance during mobility activities (PT) Outcome: Completed/Met   Problem: Sit to Stand Goal: LTG:  Patient will perform sit to stand with assistance level (PT) Description: LTG:  Patient will perform sit to stand with assistance level (PT) Outcome: Completed/Met   Problem: RH Bed to Chair Transfers Goal: LTG Patient will perform bed/chair transfers w/assist (PT) Description: LTG: Patient will perform bed to chair transfers with assistance (PT). Outcome: Completed/Met   Problem: RH Car Transfers Goal: LTG Patient will perform car transfers with assist (PT) Description: LTG: Patient will perform car transfers with assistance (PT). Outcome: Completed/Met   Problem: RH Furniture Transfers Goal: LTG Patient will perform furniture transfers w/assist (OT/PT) Description: LTG: Patient will perform furniture transfers  with assistance (OT/PT). Outcome: Completed/Met   Problem: RH Ambulation Goal: LTG Patient will ambulate in controlled environment (PT) Description: LTG: Patient will ambulate in a controlled environment, # of feet with assistance (PT). Outcome: Completed/Met Goal: LTG Patient will ambulate in home environment (PT) Description: LTG: Patient will ambulate in home environment, # of feet with assistance (PT). Outcome: Completed/Met

## 2021-06-30 NOTE — Progress Notes (Signed)
Clinically undetermined

## 2021-07-05 ENCOUNTER — Other Ambulatory Visit: Payer: Self-pay

## 2021-07-05 ENCOUNTER — Encounter: Payer: Self-pay | Admitting: Emergency Medicine

## 2021-07-05 ENCOUNTER — Emergency Department: Payer: Medicaid Other

## 2021-07-05 ENCOUNTER — Emergency Department
Admission: EM | Admit: 2021-07-05 | Discharge: 2021-07-06 | Disposition: A | Discharge status: Home / Self Care | Payer: Medicaid Other | Attending: Emergency Medicine | Admitting: Emergency Medicine

## 2021-07-05 DIAGNOSIS — L0231 Cutaneous abscess of buttock: Secondary | ICD-10-CM | POA: Diagnosis present

## 2021-07-05 DIAGNOSIS — D649 Anemia, unspecified: Secondary | ICD-10-CM | POA: Diagnosis not present

## 2021-07-05 DIAGNOSIS — E11622 Type 2 diabetes mellitus with other skin ulcer: Secondary | ICD-10-CM | POA: Insufficient documentation

## 2021-07-05 DIAGNOSIS — L98429 Non-pressure chronic ulcer of back with unspecified severity: Secondary | ICD-10-CM | POA: Insufficient documentation

## 2021-07-05 DIAGNOSIS — Z992 Dependence on renal dialysis: Secondary | ICD-10-CM | POA: Insufficient documentation

## 2021-07-05 DIAGNOSIS — E1122 Type 2 diabetes mellitus with diabetic chronic kidney disease: Secondary | ICD-10-CM | POA: Diagnosis not present

## 2021-07-05 DIAGNOSIS — N186 End stage renal disease: Secondary | ICD-10-CM | POA: Diagnosis not present

## 2021-07-05 DIAGNOSIS — L98422 Non-pressure chronic ulcer of back with fat layer exposed: Secondary | ICD-10-CM

## 2021-07-05 LAB — CBC WITH DIFFERENTIAL/PLATELET
Abs Immature Granulocytes: 0.05 10*3/uL (ref 0.00–0.07)
Basophils Absolute: 0 10*3/uL (ref 0.0–0.1)
Basophils Relative: 0 %
Eosinophils Absolute: 0.1 10*3/uL (ref 0.0–0.5)
Eosinophils Relative: 1 %
HCT: 21.7 % — ABNORMAL LOW (ref 39.0–52.0)
Hemoglobin: 6.6 g/dL — ABNORMAL LOW (ref 13.0–17.0)
Immature Granulocytes: 0 %
Lymphocytes Relative: 11 %
Lymphs Abs: 1.3 10*3/uL (ref 0.7–4.0)
MCH: 28.7 pg (ref 26.0–34.0)
MCHC: 30.4 g/dL (ref 30.0–36.0)
MCV: 94.3 fL (ref 80.0–100.0)
Monocytes Absolute: 0.8 10*3/uL (ref 0.1–1.0)
Monocytes Relative: 7 %
Neutro Abs: 9.8 10*3/uL — ABNORMAL HIGH (ref 1.7–7.7)
Neutrophils Relative %: 81 %
Platelets: 207 10*3/uL (ref 150–400)
RBC: 2.3 MIL/uL — ABNORMAL LOW (ref 4.22–5.81)
RDW: 14.9 % (ref 11.5–15.5)
WBC: 12.1 10*3/uL — ABNORMAL HIGH (ref 4.0–10.5)
nRBC: 0 % (ref 0.0–0.2)

## 2021-07-05 LAB — COMPREHENSIVE METABOLIC PANEL
ALT: 10 U/L (ref 0–44)
AST: 13 U/L — ABNORMAL LOW (ref 15–41)
Albumin: 1.6 g/dL — ABNORMAL LOW (ref 3.5–5.0)
Alkaline Phosphatase: 115 U/L (ref 38–126)
Anion gap: 7 (ref 5–15)
BUN: 12 mg/dL (ref 6–20)
CO2: 30 mmol/L (ref 22–32)
Calcium: 7 mg/dL — ABNORMAL LOW (ref 8.9–10.3)
Chloride: 101 mmol/L (ref 98–111)
Creatinine, Ser: 2.14 mg/dL — ABNORMAL HIGH (ref 0.61–1.24)
GFR, Estimated: 42 mL/min — ABNORMAL LOW (ref >60)
Glucose, Bld: 121 mg/dL — ABNORMAL HIGH (ref 70–99)
Potassium: 3.7 mmol/L (ref 3.5–5.1)
Sodium: 138 mmol/L (ref 135–145)
Total Bilirubin: 0.9 mg/dL (ref 0.3–1.2)
Total Protein: 6 g/dL — ABNORMAL LOW (ref 6.5–8.1)

## 2021-07-05 LAB — LACTIC ACID, PLASMA
Lactic Acid, Venous: 0.7 mmol/L (ref 0.5–1.9)
Lactic Acid, Venous: 0.6 mmol/L (ref 0.5–1.9)

## 2021-07-05 MED ORDER — OXYCODONE-ACETAMINOPHEN 5-325 MG PO TABS: 1.0000 | ORAL_TABLET | ORAL | 0 refills | Status: DC | PRN

## 2021-07-05 MED ORDER — MORPHINE SULFATE (PF) 4 MG/ML IV SOLN
4.0000 mg | Freq: Once | INTRAVENOUS | Status: AC
  Administered 2021-07-05: 4 mg via INTRAVENOUS
  Filled 2021-07-05: qty 1

## 2021-07-05 MED ORDER — SODIUM CHLORIDE 0.9 % IV SOLN: 10.0000 mL/h | Freq: Once | INTRAVENOUS | Status: DC

## 2021-07-05 MED ORDER — ONDANSETRON HCL 4 MG/2ML IJ SOLN
4.0000 mg | Freq: Once | INTRAMUSCULAR | Status: AC
  Administered 2021-07-05: 4 mg via INTRAVENOUS
  Filled 2021-07-05: qty 2

## 2021-07-05 NOTE — ED Provider Notes (Signed)
[facility] Provider Note    Event Date/Time   First MD Initiated Contact with Patient 07/05/21 1401     (approximate)  History   Chief Complaint: Wound Infection  HPI  Jonathan Mcmillan is a 26 y.o. male with a past medical history of diabetes, ESRD on HD, last dialysis this morning, presents emergency department for evaluation of an abscess to his buttocks.  According to the patient for the last week or so he has had worsening pain and now foul-smelling discharge from his buttocks where the patient was seen earlier today by his doctor for a large abscess/ulceration and plan follow-up with wound clinic.  Patient has not yet started antibiotics.  States a family member saw the area and wanted him to come to the ER for evaluation.  Physical Exam   Triage Vital Signs: ED Triage Vitals [07/05/21 1325]  Enc Vitals Group     BP 129/75     Pulse Rate 95     Resp 17     Temp 98.6 F (37 C)     Temp Source Oral     SpO2 100 %     Weight 155 lb 6.8 oz (70.5 kg)     Height 5\' 8"  (1.727 m)     Head Circumference      Peak Flow      Pain Score 10     Pain Loc      Pain Edu?      Excl. in [place]?     Most recent vital signs: Vitals:   07/05/21 1325  BP: 129/75  Pulse: 95  Resp: 17  Temp: 98.6 F (37 C)  SpO2: 100%    General: Awake, no distress.  CV:  Good peripheral perfusion.  Regular rate and rhythm  Resp:  Normal effort.  Equal breath sounds bilaterally.  Abd:  No distention.  Soft, nontender.  No rebound or guarding. Other:  Patient has a large ulcerated area that is fluctuant consistent with abscess as well and to the gluteal cleft, possibly worsening ulceration versus pilonidal cyst versus abscess.   ED Results / Procedures / Treatments   RADIOLOGY  I personally reviewed the CT images patient appears to have a large sacral ulceration.  IMPRESSION:  Subcutaneous gas collection at the level of the sacrococcygeal  junction reflecting abscess.  There is decreased visualization of the  dorsal cortical margin of S5 concerning for osteomyelitis.        MEDICATIONS ORDERED IN ED: Medications  0.9 %  sodium chloride infusion (has no administration in time range)     IMPRESSION / MDM / ASSESSMENT AND PLAN / ED COURSE  I reviewed the triage vital signs and the nursing notes.  Patient presents the emergency department for worsening abscess foul-smelling ulceration to patient's gluteal cleft.  Appears most consistent with sacral ulcer but also is fluctuant consistent with likely abscess.  Is quite foul-smelling.  No reported fever.  Afebrile in the emergency department.  Patient's lab work shows a white blood cell count of 12.1.  Patient does have end-stage renal disease, has history of anemia in the past, hemoglobin is down to 6.6 from 8.9 previously.  We will dose 1 unit of blood.  I discussed this with the patient who is agreeable.  Chemistry is resulted largely at baseline creatinine.  Normal potassium.  Normal lactic acid.  I personally reviewed the CT images patient has a large appears to be sacral ulceration.  I spoke to  Dr. Christian Mate who has been down to the bedside has seen the patient.  I have debrided the wound per Dr. Forest Becker instructions.  We will pack with gauze and apply wound dressing.  Patient states he has a follow-up appointment tomorrow with wound clinic made by his doctor.  Patient has already been prescribed antibiotics which are currently at the pharmacy waiting for him to pick them up.  Given the patient's CT findings of possible osteomyelitis I strongly recommended admission for IV antibiotics.  Patient strongly wishes to go home understands the risk and still wishes to leave.  He is agreeable to stay for a blood transfusion prior to leaving.  We will transfuse 1 unit of packed red blood cells apply a dressing to the patient's sacral ulceration and have him follow-up with wound care.  Patient is to start his  antibiotics tonight.  Patient agreeable to plan.  We will discharge with short course of pain medication as well.  INCISION AND DRAINAGE Performed by: Harvest Dark Consent: Verbal consent obtained. Risks and benefits: risks, benefits and alternatives were discussed Type: abscess  Body area: Sacral ulceration  Anesthesia: local infiltration  Incision was made with a scalpel.  Debrided significant amount of the wound there was already dead tissue and insensate.  Local anesthetic: None required, no pain to this area.  Drainage: purulent  Drainage amount: 42ml  Packing material: Dry gauze  Patient tolerance: Patient tolerated the procedure well with no immediate complications.  FINAL CLINICAL IMPRESSION(S) / ED DIAGNOSES   Anemia Sacral ulcer  Rx / DC Orders   [place]  Note:  This document was prepared using Dragon voice recognition software and may include unintentional dictation errors.   Harvest Dark, MD 07/05/21 [phone]

## 2021-07-05 NOTE — ED Notes (Signed)
Pt states he has a wound on his buttocks that is infected. He saw his PCP today for this, who prescribed antibiotics. Pt has not had a chance to pick up the antibiotics yet. Pt states his aunt wanted him to be evaluated in the ER.

## 2021-07-05 NOTE — ED Provider Notes (Signed)
----------------------------------------- °  11:43 PM on 07/05/2021 ----------------------------------------- Ongoing care assigned to Dr. Leonides Schanz.  Follow-up on reassessment after patient completes blood transfusion.  Thereafter anticipate likely discharge to follow-up closely with wound care clinic later tomorrow as well as with primary care doctor.   Delman Kitten, MD 07/05/21 [phone]

## 2021-07-05 NOTE — Discharge Instructions (Addendum)
Please take your antibiotics as prescribed by your doctor.  Please follow-up with wound care as scheduled.    Please follow up with your PCP to follow up on your repeat hemoglobin after receiving 1 unit of blood in the ED.  We have recommended admission to the hospital but you have declined.  If you have any worsening symptoms that concern you, high fever of 100.4 or higher, chest pain or shortness of breath, vomiting that does not stop, feel like you are going to pass out or do pass out, worsening sacral pain or drainage, please return to the emergency department immediately.

## 2021-07-05 NOTE — ED Provider Notes (Signed)
°----------------------------------------- °  6:45 PM on 07/05/2021 ----------------------------------------- Informed by blood bank at this time that they have not yet received a type and screen for the patient.  I have now ordered this.  The patient has been ordered for 1 unit of blood transfusion by Dr. Kerman Passey, but evidently a type and screen had not yet been performed.  I have now ordered this.     Delman Kitten, MD 07/05/21 [phone]

## 2021-07-05 NOTE — ED Triage Notes (Signed)
Pt comes into the ED via POV c/o wound infection on his buttock.  Pt states the wound is larger than the size of a softball and now is "oozing and smelling".  Pt states his MD prescribed him oral abx today, but he has not started taking them yet.  Pt ambulatory to triage at this time with his walker.  PT has even and unlabored respirations.

## 2021-07-05 NOTE — ED Provider Notes (Signed)
Nurse notified me that blood is now ready for infusion.  Checked on the patient and he is currently resting on his side reports he still having pain in his buttock area sacral region about 6 out of 10.  Would like additional pain medicine which I have ordered.  He is fully awake and oriented  I did offer and discussed admission to the hospital with the patient, he remains adamant that he does not wish to be admitted but current plan would be for discharge and follow-up with wound clinic tomorrow which Dr. Hedwig Morton had previously addressed as well  Patient also requesting [place] sandwich we will provide     Delman Kitten, MD 07/05/21 2035

## 2021-07-06 LAB — BPAM RBC
Blood Product Expiration Date: 202303222359
ISSUE DATE / TIME: 202302222042
Unit Type and Rh: 1700

## 2021-07-06 LAB — TYPE AND SCREEN
ABO/RH(D): B POS
Antibody Screen: NEGATIVE
Unit division: 0

## 2021-07-06 LAB — PREPARE RBC (CROSSMATCH)

## 2021-07-06 NOTE — ED Notes (Signed)
Wound care completed on sacral wound, wet to dry dressing applied with 4x4 gauze, abd pad and tape. Pt tolerated dressing application well. Discharge instructions and prescriptions reviewed with pt's Aunt Tammy (legal guardian). All questions answered and verbal understanding indicated for all discharge instructions.

## 2021-07-06 NOTE — ED Provider Notes (Signed)
12:35 AM  Assumed care at shift change.  Patient here for sacral decubitus ulcer that has been debrided and patient has been seen by Dr. Christian Mate with surgery.  He is supposedly on antibiotics that were prescribed by an outside doctor.  Was found to be anemic here is likely due to end-stage renal disease.  Received a unit of blood.  He was offered admission multiple times by myself, Dr. Jacqualine Code and Dr. Kerman Passey and patient continues to refuse.  His aunt is his legal guardian.  We have attempted to get in touch with her multiple times and she has not answered her phone.  He continues to be verbally aggressive with multiple staff members stating that he wants to leave, raising his voice and cursing.  Have advised him to follow-up with his primary care doctor, wound care.  Referral for wound care has been placed.  He remains hemodynamically stable, afebrile, nontoxic in appearance.  Will discharge patient.  At this time I do not feel he needs to sign out [place].  Have discussed at length return precautions and recommended he return to the emergency department anytime if he changes his mind and wants further work-up/treatment.   Ronnesha Mester, Delice Bison, DO 07/06/21 [phone]

## 2021-07-13 ENCOUNTER — Encounter: Payer: Self-pay | Admitting: Registered Nurse

## 2021-07-13 ENCOUNTER — Other Ambulatory Visit: Payer: Self-pay

## 2021-07-13 ENCOUNTER — Encounter: Payer: Medicaid Other | Attending: Registered Nurse | Admitting: Registered Nurse

## 2021-07-13 VITALS — BP 134/88 | HR 79 | Ht 68.0 in | Wt 143.6 lb

## 2021-07-13 DIAGNOSIS — N186 End stage renal disease: Secondary | ICD-10-CM | POA: Insufficient documentation

## 2021-07-13 DIAGNOSIS — R5381 Other malaise: Secondary | ICD-10-CM | POA: Insufficient documentation

## 2021-07-13 DIAGNOSIS — G931 Anoxic brain damage, not elsewhere classified: Secondary | ICD-10-CM | POA: Diagnosis present

## 2021-07-13 DIAGNOSIS — Z992 Dependence on renal dialysis: Secondary | ICD-10-CM | POA: Diagnosis not present

## 2021-07-13 NOTE — Progress Notes (Signed)
Subjective:    Patient ID: Jonathan Mcmillan, male    DOB: 09-07-1995, 26 y.o.   MRN: [id number]  HPI: Jonathan Mcmillan is a 26 y.o. male who is here for Hospital Follow up appointment of his Anoxic Encephalopathy, ESRD on hemodialysis, Debility and Sacral Decubitus Ulcer. He went to [facility] on 05/31/2021 with  body aches and fever.   H&P: Dr Francine Graven Chief Complaint: Body aches/fever   HPI: Jonathan Mcmillan is a 26 y.o. male with medical history significant for diabetes mellitus with complications of stage IV chronic kidney disease, hypertension, nicotine dependence who presents to the emergency room for evaluation of a 3-day history of fever, chills, nonproductive cough, myalgias and diarrhea.  He denies having any sick contacts. He denies having any urinary frequency, nocturia or dysuria and denies having any abdominal pain.  He denies having a headache, no sore throat, no chest pain, no shortness of breath, no back pain, no lower extremity swelling, no focal deficits of blurred vision.  He denies having any hematemesis, no melena stools or hematochezia and denies NSAID use. Upon arrival to the ER he was noted to have a T-max of 102.6, he was tachycardic and tachypneic.  Sodium 131, potassium 4.4, chloride 102, bicarb 15, glucose 177, BUN 19, creatinine 13.86 when compared to baseline of 3.92, calcium 6.7, alkaline phosphatase 70, albumin 2.9, lipase 29, AST 24, ALT 33, total protein 6.3, lactic acid 2.1, white count 12.7, hemoglobin 5.2 compared to baseline of 8.3, hematocrit 15.8, MCV 89.8, RDW 12.8, platelet count 172, PT 15.8, INR 1.3 Respiratory viral panel is negative Urine analysis shows proteinuria Chest x-ray reviewed by me shows no evidence of acute cardiopulmonary disease Twelve-lead EKG reviewed by me shows sinus tachycardia with nonspecific T wave changes in the lateral leads.  ED Course: Patient is a 26 year old African-American male with a known history of diabetes mellitus  with stage IV chronic kidney disease who presents to the ER for evaluation of what appears to be a viral syndrome associated with fever, nonproductive cough, chills and myalgias. His respiratory viral panel is negative and his labs reveal worsening of his renal function from baseline 3.92 >> 13.86. Urine analysis shows proteinuria. He will be admitted to the hospital for further evaluation.   Jonathan Mcmillan had long hospital stay: see discharge summary for details. He suffered PEA Arrest, CRRT/ on Hemodialysis, Heart Failure on Entrsto.   He was admitted to inpatient rehabilitation on 06/22/2021 and discharged home on 06/29/2021. He is receiving [facility] Health Therapy with [facility]. He states he has sacral pain ( Sacral decubitus), odor noted He has a wound care appointment on 07/14/2021  He rates his pain 9.   Also reports his appetite is fair.   He went to [facility] ED on 07/05/2021 H&P: Dr. Blanchard Kelch Jonathan Mcmillan is a 26 y.o. male with a past medical history of diabetes, ESRD on HD, last dialysis this morning, presents emergency department for evaluation of an abscess to his buttocks.  According to the patient for the last week or so he has had worsening pain and now foul-smelling discharge from his buttocks where the patient was seen earlier today by his doctor for a large abscess/ulceration and plan follow-up with wound clinic.  Patient has not yet started antibiotics.  States a family member saw the area and wanted him to come to the ER for evaluation. Discharge summary was reviewed: Medical staff wanted to admit patient, he refused: see note for details.  Dr Leonides Schanz note on 07/06/2021 12:35 AM  Assumed care at shift change.  Patient here for sacral decubitus ulcer that has been debrided and patient has been seen by Dr. Christian Mate with surgery.  He is supposedly on antibiotics that were prescribed by an outside doctor.  Was found to be anemic here is likely due to end-stage renal  disease.  Received a unit of blood.  He was offered admission multiple times by myself, Dr. Jacqualine Code and Dr. Kerman Passey and patient continues to refuse.  His aunt is his legal guardian.  We have attempted to get in touch with her multiple times and she has not answered her phone.  He continues to be verbally aggressive with multiple staff members stating that he wants to leave, raising his voice and cursing.  Have advised him to follow-up with his primary care doctor, wound care.  Referral for wound care has been placed.  He remains hemodynamically stable, afebrile, nontoxic in appearance.  Will discharge patient.  At this time I do not feel he needs to sign out [place].  Have discussed at length return precautions and recommended he return to the emergency department anytime if he changes his mind and wants further work-up/treatment.   Ward, Delice Bison, DO 07/06/21 0037  Jonathan Mcmillan states he is compliant with medication.      Pain Inventory Average Pain 9 Pain Right Now 9 My pain is sharp, burning, and aching  In the last 24 hours, has pain interfered with the following? General activity 8 Relation with others 9 Enjoyment of life 9 What TIME of day is your pain at its worst? morning , daytime, evening, and night Sleep (in general) Poor  Pain is worse with: walking, bending, sitting, and standing Pain improves with: pacing activities and medication Relief from Meds: 6  use a walker how many minutes can you walk? 15 ability to climb steps?  no do you drive?  no  what is your job? [employer] not employed: date last employed 06/09/21  bowel control problems weakness numbness tingling trouble walking spasms confusion depression anxiety  Any changes since last visit?  no  Any changes since last visit?  no    Family History  Problem Relation Age of Onset   Diabetes Mother    Social History   Socioeconomic History   Marital status: Single    Spouse name:  Not on file   Number of children: Not on file   Years of education: Not on file   Highest education level: Not on file  Occupational History   Occupation: unemployed  Tobacco Use   Smoking status: Every Day    Packs/day: 0.50    Types: Cigarettes   Smokeless tobacco: Never  Vaping Use   Vaping Use: Never used  Substance and Sexual Activity   Alcohol use: No   Drug use: Yes    Types: Marijuana   Sexual activity: Not on file  Other Topics Concern   Not on file  Social History Narrative   Not on file   Social Determinants of Health   Financial Resource Strain: Not on file  Food Insecurity: Not on file  Transportation Needs: Not on file  Physical Activity: Not on file  Stress: Not on file  Social Connections: Not on file   Past Surgical History:  Procedure Laterality Date   COLONOSCOPY WITH PROPOFOL N/A 06/21/2021   Procedure: COLONOSCOPY WITH PROPOFOL;  Surgeon: Lin Landsman, MD;  Location: [facility] ENDOSCOPY;  Service: Gastroenterology;  Laterality: N/A;   DIALYSIS/PERMA CATHETER INSERTION N/A 06/15/2021   Procedure: DIALYSIS/PERMA CATHETER INSERTION;  Surgeon: Algernon Huxley, MD;  Location: [facility] CV LAB;  Service: Cardiovascular;  Laterality: N/A;   DIALYSIS/PERMA CATHETER INSERTION N/A 06/19/2021   Procedure: DIALYSIS/PERMA CATHETER INSERTION;  Surgeon: Algernon Huxley, MD;  Location: [place] [facility] LAB;  Service: Cardiovascular;  Laterality: N/A;   INCISION AND DRAINAGE PERIRECTAL ABSCESS N/A 06/16/2017   Procedure: IRRIGATION AND DEBRIDEMENT PERIRECTAL ABSCESS;  Surgeon: Florene Glen, MD;  Location: [facility] ORS;  Service: General;  Laterality: N/A;   none     Past Medical History:  Diagnosis Date   Cannabinoid hyperemesis syndrome    CKD (chronic kidney disease), stage IV (HCC)    Diabetes 1.5, managed as type 1 ([place])    DKA (diabetic ketoacidoses) 05/21/2016   Gastroparesis    HTN (hypertension)    Nicotine dependence    Perirectal abscess 06/16/2017   Right  arm cellulitis 01/26/2018   BP 134/88    Pulse 79    Ht 5\' 8"  (1.727 m)    Wt 143 lb 9.6 oz (65.1 kg)    SpO2 98%    BMI 21.83 kg/m   Opioid Risk Score:   Fall Risk Score:  `1  Depression screen PHQ 2/9  Depression screen PHQ 2/9 07/13/2021  Decreased Interest 3  Down, Depressed, Hopeless 3  PHQ - 2 Score 6  Altered sleeping 3  Tired, decreased energy 3  Change in appetite 3  Feeling bad or failure about yourself  1  Trouble concentrating 2  Moving slowly or fidgety/restless 3  Suicidal thoughts 0  PHQ-9 Score 21  Difficult doing work/chores Very difficult     Review of Systems  Constitutional:  Positive for unexpected weight change.  HENT: Negative.    Eyes: Negative.   Respiratory:  Positive for shortness of breath.   Gastrointestinal:  Positive for nausea and vomiting.  Endocrine: Negative.   Genitourinary: Negative.   Musculoskeletal:  Positive for back pain.  Skin:  Positive for rash.  Allergic/Immunologic: Negative.   Neurological:  Positive for weakness and numbness.  Hematological: Negative.   Psychiatric/Behavioral:  Positive for confusion and sleep disturbance. The patient is nervous/anxious.       Objective:   Physical Exam Vitals and nursing note reviewed.  Constitutional:      Appearance: He is ill-appearing.     Comments: Frail  Cardiovascular:     Rate and Rhythm: Normal rate and regular rhythm.     Pulses: Normal pulses.     Heart sounds: Normal heart sounds.  Pulmonary:     Effort: Pulmonary effort is normal.     Breath sounds: Normal breath sounds.  Musculoskeletal:     Cervical back: Normal range of motion and neck supple.     Comments: Normal Muscle Bulk and Muscle Testing Reveals:  Upper Extremities: Full ROM and Muscle Strength 4/5  Lower Extremities: Decreased ROM  and Muscle Strength 4/5 Arises from Table Slowly with walker     Skin:    General: Skin is warm and dry.  Neurological:     Mental Status: He is alert and oriented to  person, place, and time.  Psychiatric:        Mood and Affect: Mood normal.        Behavior: Behavior normal.         Assessment & Plan:  Anoxic Encephalopathy: He was instructed to call his PCP to scheduled HFU appointment. This  provider offered to call and scheduled appointment he refused ESRD on hemodialysis on M/W/F: Right Subclavian Shiley Intact. Nephrology Following.  3., Debility: Continue Home Health Therapy with [facility] 4. Sacral Decubitus Ulcer: See ED Note 07/05/2021: He has a scheduled appointment with [facility] on 07/14/2021 5. S/P Cardiac Arrest/ Atrial Fibrillation with [facility]: Has a scheduled appointment with cardiology on 07/18/2021  F/U with Dr Letta Pate in 4- 6 weeks .

## 2021-07-13 NOTE — Patient Instructions (Signed)
Call to scheduled appointment with Primary Care Doctor ?[place]  ?[phone]  ?

## 2021-07-14 ENCOUNTER — Other Ambulatory Visit
Admission: RE | Admit: 2021-07-14 | Discharge: 2021-07-14 | Disposition: A | Discharge status: Home / Self Care | Payer: Medicaid Other | Source: Ambulatory Visit | Attending: Physician Assistant | Admitting: Physician Assistant

## 2021-07-14 ENCOUNTER — Encounter: Payer: Medicaid Other | Attending: Physician Assistant | Admitting: Physician Assistant

## 2021-07-14 DIAGNOSIS — E1022 Type 1 diabetes mellitus with diabetic chronic kidney disease: Secondary | ICD-10-CM | POA: Diagnosis not present

## 2021-07-14 DIAGNOSIS — G931 Anoxic brain damage, not elsewhere classified: Secondary | ICD-10-CM | POA: Diagnosis not present

## 2021-07-14 DIAGNOSIS — I2510 Atherosclerotic heart disease of native coronary artery without angina pectoris: Secondary | ICD-10-CM | POA: Diagnosis not present

## 2021-07-14 DIAGNOSIS — N186 End stage renal disease: Secondary | ICD-10-CM | POA: Diagnosis not present

## 2021-07-14 DIAGNOSIS — B999 Unspecified infectious disease: Secondary | ICD-10-CM | POA: Diagnosis present

## 2021-07-14 DIAGNOSIS — Z992 Dependence on renal dialysis: Secondary | ICD-10-CM | POA: Insufficient documentation

## 2021-07-14 DIAGNOSIS — I120 Hypertensive chronic kidney disease with stage 5 chronic kidney disease or end stage renal disease: Secondary | ICD-10-CM | POA: Diagnosis not present

## 2021-07-14 DIAGNOSIS — E1040 Type 1 diabetes mellitus with diabetic neuropathy, unspecified: Secondary | ICD-10-CM | POA: Insufficient documentation

## 2021-07-14 DIAGNOSIS — L89890 Pressure ulcer of other site, unstageable: Secondary | ICD-10-CM | POA: Diagnosis not present

## 2021-07-14 DIAGNOSIS — F17200 Nicotine dependence, unspecified, uncomplicated: Secondary | ICD-10-CM | POA: Insufficient documentation

## 2021-07-14 DIAGNOSIS — L89150 Pressure ulcer of sacral region, unstageable: Secondary | ICD-10-CM | POA: Insufficient documentation

## 2021-07-14 NOTE — Progress Notes (Signed)
BRISTOL, SOY ([id number]) ?Visit Report for 07/14/2021 ?Abuse Risk Screen Details ?Patient Name: Jonathan Mcmillan, Jonathan Mcmillan. ?Date of Service: 07/14/2021 8:45 AM ?Medical Record Number: [id number] ?Patient Account Number: [id number] ?Date of Birth/Sex: [birth year] (26 y.o. M) ?Treating RN: Cornell Barman ?Primary Care Khalaya Mcgurn: Denton Lank Other Clinician: ?Referring Hatsue Sime: Delman Kitten ?Treating Taysia Rivere/Extender: Jeri Cos ?Weeks in Treatment: 0 ?Abuse Risk Screen Items ?Answer ?ABUSE RISK SCREEN: ?Has anyone close to you tried to hurt or harm you recentlyo No ?Do you feel uncomfortable with anyone in your familyo No ?Has anyone forced you do things that you didnot want to doo No ?Electronic Signature(s) ?Signed: 07/14/2021 5:12:48 PM By: Gretta Cool, BSN, RN, CWS, Kim RN, BSN ?Entered By: Gretta Cool, BSN, RN, CWS, Kim on 07/14/2021 09:12:08 ?FERGUSON, GERTNER. ([id number]) ?-------------------------------------------------------------------------------- ?Activities of Daily Living Details ?Patient Name: Jonathan, Mcmillan. ?Date of Service: 07/14/2021 8:45 AM ?Medical Record Number: [id number] ?Patient Account Number: [id number] ?Date of Birth/Sex: 1995/05/27 (26 y.o. M) ?Treating RN: Cornell Barman ?Primary Care Trenace Coughlin: Denton Lank Other Clinician: ?Referring Square Jowett: Delman Kitten ?Treating Skylie Hiott/Extender: Jeri Cos ?Weeks in Treatment: 0 ?Activities of Daily Living Items ?Answer ?Activities of Daily Living (Please select one for each item) ?Drive Automobile Need Assistance ?Take Medications Need Assistance ?Use Telephone Need Assistance ?Care for Appearance Need Assistance ?Use Toilet Need Assistance ?Bath / Shower Need Assistance ?Dress Self Need Assistance ?Feed Self Need Assistance ?Walk Need Assistance ?Get In / Out Bed Need Assistance ?Housework Need Assistance ?Prepare Meals Need Assistance ?Handle Money Need Assistance ?Shop for Self Need Assistance ?Electronic Signature(s) ?Signed: 07/14/2021 5:12:48 PM By: Gretta Cool, BSN, RN, CWS, Kim RN,  BSN ?Entered By: Gretta Cool, BSN, RN, CWS, Kim on 07/14/2021 09:12:42 ?CASTIEL, LAURICELLA. ([id number]) ?-------------------------------------------------------------------------------- ?Education Screening Details ?Patient Name: Jonathan, Mcmillan. ?Date of Service: 07/14/2021 8:45 AM ?Medical Record Number: [id number] ?Patient Account Number: [id number] ?Date of Birth/Sex: March 31, 1996 (25 y.o. M) ?Treating RN: Cornell Barman ?Primary Care Jerald Hennington: Denton Lank Other Clinician: ?Referring Ahuva Poynor: Delman Kitten ?Treating Spiro Ausborn/Extender: Jeri Cos ?Weeks in Treatment: 0 ?Primary Learner Assessed: Patient ?Learning Preferences/Education Level/Primary Language ?Learning Preference: Explanation ?Highest Education Level: High School ?Preferred Language: English ?Cognitive Barrier ?Language Barrier: No ?Translator Needed: No ?Memory Deficit: No ?Emotional Barrier: No ?Physical Barrier ?Impaired Vision: No ?Impaired Hearing: No ?Decreased Hand dexterity: No ?Knowledge/Comprehension ?Knowledge Level: Low ?Comprehension Level: Low ?Ability to understand verbal instructions: High ?Motivation ?Anxiety Level: Anxious ?Cooperation: Cooperative ?Interest in Health Problems: Uninterested ?Perception: Coherent ?Willingness to Engage in Self-Management ?Low ?Activities: ?Readiness to Engage in Self-Management ?Low ?Activities: ?Electronic Signature(s) ?Signed: 07/14/2021 5:12:48 PM By: Gretta Cool, BSN, RN, CWS, Kim RN, BSN ?Entered By: Gretta Cool, BSN, RN, CWS, Kim on 07/14/2021 09:13:51 ?BROADY, LAFOY. ([id number]) ?-------------------------------------------------------------------------------- ?Fall Risk Assessment Details ?Patient Name: Jonathan, Mcmillan. ?Date of Service: 07/14/2021 8:45 AM ?Medical Record Number: [id number] ?Patient Account Number: [id number] ?Date of Birth/Sex: [birth year] (26 y.o. M) ?Treating RN: Cornell Barman ?Primary Care Zanobia Griebel: Denton Lank Other Clinician: ?Referring Ennis Heavner: Delman Kitten ?Treating Nannette Zill/Extender: Jeri Cos ?Weeks in Treatment: 0 ?Fall Risk Assessment Items ?Have you had 2 or more falls in the last 12 monthso 0 No ?Have you had any fall that resulted in injury in the last 12 monthso 0 No ?FALLS RISK SCREEN ?History of falling - immediate or within 3 months 0 No ?Secondary diagnosis (Do you have 2 or more medical diagnoseso) 15 Yes ?Ambulatory aid ?None/bed rest/wheelchair/nurse 0 No ?Crutches/cane/walker 15 Yes ?Furniture 0 No ?Intravenous therapy Access/Saline/Heparin Lock 0 No ?Gait/Transferring ?Normal/ bed rest/ wheelchair 0 No ?Weak (  short steps with or without shuffle, stooped but able to lift head while walking, may ?0 No ?seek support from furniture) ?Impaired (short steps with shuffle, may have difficulty arising from chair, head down, impaired ?20 Yes ?balance) ?Mental Status ?Oriented to own ability 0 No ?Electronic Signature(s) ?Signed: 07/14/2021 5:12:48 PM By: Gretta Cool, BSN, RN, CWS, Kim RN, BSN ?Entered By: Gretta Cool, BSN, RN, CWS, Kim on 07/14/2021 09:14:36 ?NUNO, BRUBACHER. ([id number]) ?-------------------------------------------------------------------------------- ?Foot Assessment Details ?Patient Name: Jonathan, Mcmillan. ?Date of Service: 07/14/2021 8:45 AM ?Medical Record Number: [id number] ?Patient Account Number: [id number] ?Date of Birth/Sex: November 19, 1995 (26 y.o. M) ?Treating RN: Cornell Barman ?Primary Care Stanley Helmuth: Denton Lank Other Clinician: ?Referring Lajuanda Penick: Delman Kitten ?Treating Kionte Baumgardner/Extender: Jeri Cos ?Weeks in Treatment: 0 ?Foot Assessment Items ?Site Locations ?+ = Sensation present, - = Sensation absent, C = Callus, U = Ulcer ?R = Redness, W = Warmth, M = Maceration, PU = Pre-ulcerative lesion ?F = Fissure, S = Swelling, D = Dryness ?Assessment ?Right: Left: ?Other Deformity: No No ?Prior Foot Ulcer: Yes No ?Prior Amputation: Yes No ?Charcot Joint: No No ?Ambulatory Status: Ambulatory With Help ?Assistance Device: Walker ?Gait: Steady ?Electronic Signature(s) ?Signed: 07/14/2021 5:12:48 PM  By: Gretta Cool, BSN, RN, CWS, Kim RN, BSN ?Entered By: Gretta Cool, BSN, RN, CWS, Kim on 07/14/2021 09:17:26 ?BURCH, MARCHUK. ([id number]) ?-------------------------------------------------------------------------------- ?Nutrition Risk Screening Details ?Patient Name: LEEMAN, JOHNSEY. ?Date of Service: 07/14/2021 8:45 AM ?Medical Record Number: [id number] ?Patient Account Number: [id number] ?Date of Birth/Sex: 1996-04-24 (26 y.o. M) ?Treating RN: Cornell Barman ?Primary Care Ravon Mcilhenny: Denton Lank Other Clinician: ?Referring Kile Kabler: Delman Kitten ?Treating Traivon Morrical/Extender: Jeri Cos ?Weeks in Treatment: 0 ?Height (in): 68 ?Weight (lbs): 146 ?Body Mass Index (BMI): 22.2 ?Nutrition Risk Screening Items ?Score Screening ?NUTRITION RISK SCREEN: ?I have an illness or condition that made me change the kind and/or amount of food I eat 0 No ?I eat fewer than two meals per day 0 No ?I eat few fruits and vegetables, or milk products 0 No ?I have three or more drinks of beer, liquor or wine almost every day 0 No ?I have tooth or mouth problems that make it hard for me to eat 0 No ?I don't always have enough money to buy the food I need 0 No ?I eat alone most of the time 0 No ?I take three or more different prescribed or over-the-counter drugs a day 0 No ?Without wanting to, I have lost or gained 10 pounds in the last six months 0 No ?I am not always physically able to shop, cook and/or feed myself 0 No ?Nutrition Protocols ?Good Risk Protocol ?Moderate Risk Protocol ?High Risk Proctocol ?Risk Level: Good Risk ?Score: 0 ?Electronic Signature(s) ?Signed: 07/14/2021 5:12:48 PM By: Gretta Cool, BSN, RN, CWS, Kim RN, BSN ?Entered By: Gretta Cool, BSN, RN, CWS, Kim on 07/14/2021 09:15:00 ?

## 2021-07-14 NOTE — Progress Notes (Signed)
Jonathan, Mcmillan ([id number]) Visit Report for 07/14/2021 Chief Complaint Document Details Patient Name: Jonathan Mcmillan, Jonathan Mcmillan. Date of Service: 07/14/2021 8:45 AM Medical Record Number: [id number] Patient Account Number: [id number] Date of Birth/Sex: 1995/08/26 (26 y.o. M) Treating RN: Carlene Coria Primary Care Provider: Denton Lank Other Clinician: Referring Provider: Delman Kitten Treating Provider/Extender: Skipper Cliche in Treatment: 0 Information Obtained from: Patient Chief Complaint Sacral, left lateral foot, and right knee pressure ulcers Electronic Signature(s) Signed: 07/14/2021 5:03:47 PM By: Worthy Keeler PA-C Entered By: Worthy Keeler on 07/14/2021 17:03:47 Jonathan Mcmillan ([id number]) -------------------------------------------------------------------------------- Debridement Details Patient Name: Jonathan Mcmillan. Date of Service: 07/14/2021 8:45 AM Medical Record Number: [id number] Patient Account Number: [id number] Date of Birth/Sex: 1996/04/18 (26 y.o. M) Treating RN: Carlene Coria Primary Care Provider: Denton Lank Other Clinician: Referring Provider: Delman Kitten Treating Provider/Extender: Skipper Cliche in Treatment: 0 Debridement Performed for Wound #1 Coccyx Assessment: Performed By: Physician Tommie Sams., PA-C Debridement Type: Debridement Level of Consciousness (Pre- Awake and Alert procedure): Pre-procedure Verification/Time Out Yes - 10:15 Taken: Start Time: 10:15 Total Area Debrided (L x W): 9 (cm) x 4.2 (cm) = 37.8 (cm) Tissue and other material Viable, Non-Viable, Slough, Subcutaneous, Slough debrided: Level: Skin/Subcutaneous Tissue Debridement Description: Excisional Instrument: Blade, Forceps Bleeding: Minimum Hemostasis Achieved: Pressure End Time: 10:25 Procedural Pain: 0 Post Procedural Pain: 0 Response to Treatment: Procedure was tolerated well Level of Consciousness (Post- Awake and Alert procedure): Post Debridement  Measurements of Total Wound Length: (cm) 9 Stage: Unstageable/Unclassified Width: (cm) 4.2 Depth: (cm) 2.5 Volume: (cm) 74.22 Character of Wound/Ulcer Post Debridement: Improved Post Procedure Diagnosis Same as Pre-procedure Electronic Signature(s) Signed: 07/14/2021 1:46:31 PM By: Carlene Coria RN Signed: 07/14/2021 5:14:00 PM By: Worthy Keeler PA-C Entered By: Carlene Coria on 07/14/2021 10:54:07 Jonathan Mcmillan ([id number]) -------------------------------------------------------------------------------- HPI Details Patient Name: Jonathan Mcmillan. Date of Service: 07/14/2021 8:45 AM Medical Record Number: [id number] Patient Account Number: [id number] Date of Birth/Sex: Oct 25, 1995 (26 y.o. M) Treating RN: Carlene Coria Primary Care Provider: Denton Lank Other Clinician: Referring Provider: Delman Kitten Treating Provider/Extender: Skipper Cliche in Treatment: 0 History of Present Illness HPI Description: 07/14/2021 patient presents today for initial inspection here in the clinic concerning an issue that he has been having with wounds over the left lateral foot, right knee, and the largest of these is the sacral region where there is significant necrotic tissue noted in the base of this wound. Subsequently the patient is referred to [place] from what appears to be primary care in order for further management of his wounds. He also sees physical medicine and rehabilitation. I did receive a lot of the notes from there as far as what is been going on with regard to this gentleman. Patient is a 26 year old male who had an anoxic encephalopathy, end-stage renal disease on hemodialysis, he has been disabled with generalized weakness and subsequently developed a sacral decubitus ulcer which apparently was present before he went into the hospital according to what is not tells me but worsened during the time in the ICU simply because he was unable to be turned with all the equipment that he was hooked  up to. Again it sounds like they did what they could to try to keep this under control but nonetheless due to the severity of his illness it sounds as if he is lucky to be alive to be perfectly honest. Subsequently the patient also does have diabetes mellitus, hypertension, nicotine dependence, he did go to the  emergency department on 07/05/2021 due to a "abscess" in the gluteal region. The family member had seen this and wanted him to be checked out. During that time medical staff wanted to admit the patient but he refused. On 07/06/2021 the patient was subsequently discharged from the hospital [facility] and apparently they were trying to get in touch according to the notes with his aunt who is the legal guardian but were not able to. That was around 12:37 AM. Nonetheless in the interim since they have been basically at home using Santyl which was the recommendation from the hospital. This has been on all locations. Dry dressings have been applied following. Unfortunately this just does not seem to be doing quite enough especially for the sacral area which is a significant wound to be honest. We do not have any sign of an x-ray that was done of the sacral area nor that they know of anything in particular that was done at the hospital. With all that being said I think an x-ray would be a good idea based on what I am seeing currently this does appear to be quite significant from a wound care perspective. Electronic Signature(s) Signed: 07/14/2021 5:09:31 PM By: Worthy Keeler PA-C Entered By: Worthy Keeler on 07/14/2021 17:09:31 Jonathan Mcmillan ([id number]) -------------------------------------------------------------------------------- Physical Exam Details Patient Name: Jonathan, Mcmillan. Date of Service: 07/14/2021 8:45 AM Medical Record Number: [id number] Patient Account Number: [id number] Date of Birth/Sex: [birth year] (26 y.o. M) Treating RN: Carlene Coria Primary Care Provider:  Denton Lank Other Clinician: Referring Provider: Delman Kitten Treating Provider/Extender: Skipper Cliche in Treatment: 0 Constitutional sitting or standing blood pressure is within target range for patient.. pulse regular and within target range for patient.[name] respirations regular, non- labored and within target range for patient.[name] temperature within target range for patient.. Well-nourished and well-hydrated in no acute distress. Eyes conjunctiva clear no eyelid edema noted. pupils equal round and reactive to light and accommodation. Ears, Nose, Mouth, and Throat no gross abnormality of ear auricles or external auditory canals. normal hearing noted during conversation. mucus membranes moist. Respiratory normal breathing without difficulty. Musculoskeletal normal gait and posture. no significant deformity or arthritic changes, no loss or range of motion, no clubbing. Psychiatric this patient is able to make decisions and demonstrates good insight into disease process. Alert and Oriented x 3. pleasant and cooperative. Notes Upon inspection patient's wounds again all appear to show signs of unstageable classification initially upon inspection. On the knee as well as the foot area I do think that this is not nearly as bad. I do believe it is still significant but I believe we have a better chance to get this healed much more quickly. Unfortunately in regard to the sacral area this is going require quite extensive debridement to be perfectly honest. I discussed this with the patient and his aunt who was present during the visit today and again who is the legal guardian. Subsequently he did consent to proceeding with debridement using forceps and a scalpel I did perform debridement to clear away the necrotic debris today which the patient tolerated without complication. He did not have any pain I did ask him multiple times he told me it was not hurting which is good news. Nonetheless he seemed  to be very cooperative here in the clinic today with what we needed to do which is great news. Electronic Signature(s) Signed: 07/14/2021 5:10:37 PM By: Worthy Keeler PA-C Entered By: Worthy Keeler on 07/14/2021  17:10:37 ARAF, CLUGSTON ([id number]) -------------------------------------------------------------------------------- Physician Orders Details Patient Name: DEMETRES, PROCHNOW. Date of Service: 07/14/2021 8:45 AM Medical Record Number: [id number] Patient Account Number: [id number] Date of Birth/Sex: Oct 17, 1995 (26 y.o. M) Treating RN: Carlene Coria Primary Care Provider: Denton Lank Other Clinician: Referring Provider: Delman Kitten Treating Provider/Extender: Skipper Cliche in Treatment: 0 Verbal / Phone Orders: No Diagnosis Coding Follow-up Appointments o Return Appointment in 1 week. - per patient choice will be seen in 2 weeks Bathing/ Shower/ Hygiene o May shower; gently cleanse wound with antibacterial soap, rinse and pat dry prior to dressing wounds Wound Treatment Wound #1 - Sacrum Primary Dressing: Gauze 1 x Per Day/30 Days Discharge Instructions: moistened with Dakins Solution 1/4 strngth Secondary Dressing: ABD Pad 5x9 (in/in) (Generic) 1 x Per Day/30 Days Discharge Instructions: Cover with ABD pad Secured With: Medipore Tape - 28M Medipore H Soft Cloth Surgical Tape, 2x2 (in/yd) (Generic) 1 x Per Day/30 Days Wound #2 - Foot Wound Laterality: Left, Lateral Primary Dressing: Gauze 1 x Per Day/30 Days Discharge Instructions: moistened with Dakins Solution 1/4 strngth Secondary Dressing: ABD Pad 5x9 (in/in) (Generic) 1 x Per Day/30 Days Discharge Instructions: Cover with ABD pad Secured With: Medipore Tape - 28M Medipore H Soft Cloth Surgical Tape, 2x2 (in/yd) (Generic) 1 x Per Day/30 Days Wound #3 - Knee Wound Laterality: Right Primary Dressing: Gauze 1 x Per Day/30 Days Discharge Instructions: moistened with Dakins Solution 1/4 strngth Secondary Dressing: ABD  Pad 5x9 (in/in) (Generic) 1 x Per Day/30 Days Discharge Instructions: Cover with ABD pad Secured With: Medipore Tape - 28M Medipore H Soft Cloth Surgical Tape, 2x2 (in/yd) (Generic) 1 x Per Day/30 Days Laboratory o Bacteria identified in Wound by Culture (MICRO) - non healing wound oooo LOINC Code: 6720-9 oooo Convenience Name: Wound culture routine Radiology o X-ray, coccyx - nonhealing wound with foul odor Patient Medications Allergies: No Known Drug Allergies Notifications Medication Indication Start End Dakin's Solution 07/14/2021 Jonathan Mcmillan ([id number]) Notifications Medication Indication Start End DOSE miscellaneous 0.25 % solution - Moisten gauze with Dakin's solution then wring out leaving gauze damp not saturated before packing daily into the wound as directed in clini Electronic Signature(s) Signed: 07/14/2021 5:12:06 PM By: Worthy Keeler PA-C Previous Signature: 07/14/2021 10:57:38 AM Version By: Carlene Coria RN Previous Signature: 07/14/2021 10:53:12 AM Version By: Carlene Coria RN Entered By: Worthy Keeler on 07/14/2021 17:12:06 Jonathan Mcmillan ([id number]) -------------------------------------------------------------------------------- Problem List Details Patient Name: Jonathan Mcmillan. Date of Service: 07/14/2021 8:45 AM Medical Record Number: [id number] Patient Account Number: [id number] Date of Birth/Sex: December 16, 1995 (26 y.o. M) Treating RN: Carlene Coria Primary Care Provider: Denton Lank Other Clinician: Referring Provider: Delman Kitten Treating Provider/Extender: Skipper Cliche in Treatment: 0 Active Problems ICD-10 Encounter Code Description Active Date MDM Diagnosis L89.150 Pressure ulcer of sacral region, unstageable 07/14/2021 No Yes L89.890 Pressure ulcer of other site, unstageable 07/14/2021 No Yes 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, not elsewhere classified 07/14/2021 No Yes I25.10 Atherosclerotic heart disease of native coronary artery without angina 07/14/2021  No Yes pectoris Inactive Problems Resolved Problems Electronic Signature(s) Signed: 07/14/2021 5:03:14 PM By: Worthy Keeler PA-C Entered By: Worthy Keeler on 07/14/2021 17:03:13 Jonathan Mcmillan ([id number]) -------------------------------------------------------------------------------- Progress Note Details Patient Name: Jonathan Mcmillan. Date of Service: 07/14/2021 8:45 AM Medical Record Number: [id number] Patient Account Number: [id number] Date of Birth/Sex: [birth year] (26 y.o. M) Treating RN: Carlene Coria Primary Care Provider: Denton Lank Other Clinician: Referring Provider: Delman Kitten Treating Provider/Extender: Jeri Cos  Weeks in Treatment: 0 Subjective Chief Complaint Information obtained from Patient Sacral, left lateral foot, and right knee pressure ulcers History of Present Illness (HPI) 07/14/2021 patient presents today for initial inspection here in the clinic concerning an issue that he has been having with wounds over the left lateral foot, right knee, and the largest of these is the sacral region where there is significant necrotic tissue noted in the base of this wound. Subsequently the patient is referred to [place] from what appears to be primary care in order for further management of his wounds. He also sees physical medicine and rehabilitation. I did receive a lot of the notes from there as far as what is been going on with regard to this gentleman. Patient is a 26 year old male who had an anoxic encephalopathy, end-stage renal disease on hemodialysis, he has been disabled with generalized weakness and subsequently developed a sacral decubitus ulcer which apparently was present before he went into the hospital according to what is not tells me but worsened during the time in the ICU simply because he was unable to be turned with all the equipment that he was hooked up to. Again it sounds like they did what they could to try to keep this under control but nonetheless  due to the severity of his illness it sounds as if he is lucky to be alive to be perfectly honest. Subsequently the patient also does have diabetes mellitus, hypertension, nicotine dependence, he did go to the emergency department on 07/05/2021 due to a "abscess" in the gluteal region. The family member had seen this and wanted him to be checked out. During that time medical staff wanted to admit the patient but he refused. On 07/06/2021 the patient was subsequently discharged from the hospital [facility] and apparently they were trying to get in touch according to the notes with his aunt who is the legal guardian but were not able to. That was around 12:37 AM. Nonetheless in the interim since they have been basically at home using Santyl which was the recommendation from the hospital. This has been on all locations. Dry dressings have been applied following. Unfortunately this just does not seem to be doing quite enough especially for the sacral area which is a significant wound to be honest. We do not have any sign of an x-ray that was done of the sacral area nor that they know of anything in particular that was done at the hospital. With all that being said I think an x-ray would be a good idea based on what I am seeing currently this does appear to be quite significant from a wound care perspective. Patient History Information obtained from Patient, Chart. Allergies No Known Drug Allergies Social History Current every day smoker, Alcohol Use - Rarely, Drug Use - Current History - mirajuana. Medical History Cardiovascular Patient has history of Hypertension Endocrine Patient has history of Type I Diabetes Genitourinary Patient has history of End Stage Renal Disease - Dialysis Monday, Wednesday, Friday Neurologic Patient has history of Neuropathy - hands and feet Patient is treated with Insulin. Blood sugar is tested. Blood sugar results noted at the following times: Bedtime -  112. Medical And Surgical History Notes Genitourinary CKD4 Review of Systems (ROS) Gastrointestinal Denies complaints or symptoms of Frequent diarrhea, Nausea, Vomiting. YU, PEGGS ([id number]) Objective Constitutional sitting or standing blood pressure is within target range for patient.. pulse regular and within target range for patient.[name] respirations regular, non- labored and within target range  for patient.[name] temperature within target range for patient.. Well-nourished and well-hydrated in no acute distress. Vitals Time Taken: 9:04 AM, Height: 68 in, Weight: 146 lbs, BMI: 22.2, Temperature: 98.4 F, Pulse: 80 bpm, Respiratory Rate: 18 breaths/min, Blood Pressure: 122/84 mmHg. Eyes conjunctiva clear no eyelid edema noted. pupils equal round and reactive to light and accommodation. Ears, Nose, Mouth, and Throat no gross abnormality of ear auricles or external auditory canals. normal hearing noted during conversation. mucus membranes moist. Respiratory normal breathing without difficulty. Musculoskeletal normal gait and posture. no significant deformity or arthritic changes, no loss or range of motion, no clubbing. Psychiatric this patient is able to make decisions and demonstrates good insight into disease process. Alert and Oriented x 3. pleasant and cooperative. General Notes: Upon inspection patient's wounds again all appear to show signs of unstageable classification initially upon inspection. On the knee as well as the foot area I do think that this is not nearly as bad. I do believe it is still significant but I believe we have a better chance to get this healed much more quickly. Unfortunately in regard to the sacral area this is going require quite extensive debridement to be perfectly honest. I discussed this with the patient and his aunt who was present during the visit today and again who is the legal guardian. Subsequently he did consent to proceeding with debridement  using forceps and a scalpel I did perform debridement to clear away the necrotic debris today which the patient tolerated without complication. He did not have any pain I did ask him multiple times he told me it was not hurting which is good news. Nonetheless he seemed to be very cooperative here in the clinic today with what we needed to do which is great news. Integumentary (Hair, Skin) Wound #1 status is Open. Original cause of wound was Gradually Appeared. The date acquired was: 05/31/2021. The wound is located on the Sacrum. The wound measures 9cm length x 4.2cm width x 2.5cm depth; 29.688cm^2 area and 74.22cm^3 volume. There is Fat Layer (Subcutaneous Tissue) exposed. There is no tunneling or undermining noted. There is a medium amount of serosanguineous drainage noted. Foul odor after cleansing was noted. There is small (1-33%) pink granulation within the wound bed. There is a large (67-100%) amount of necrotic tissue within the wound bed including Eschar and Adherent Slough. Wound #2 status is Open. Original cause of wound was Gradually Appeared. The date acquired was: 05/31/2021. The wound is located on the Left,Lateral Foot. The wound measures 2cm length x 5cm width x 0.2cm depth; 7.854cm^2 area and 1.571cm^3 volume. There is no tunneling or undermining noted. There is a medium amount of serosanguineous drainage noted. Foul odor after cleansing was noted. There is no granulation within the wound bed. There is a large (67-100%) amount of necrotic tissue within the wound bed including Eschar and Adherent Slough. Wound #3 status is Open. Original cause of wound was Gradually Appeared. The date acquired was: 05/31/2021. The wound is located on the Right Knee. The wound measures 0.7cm length x 4cm width x 0.2cm depth; 2.199cm^2 area and 0.44cm^3 volume. There is no tunneling or undermining noted. There is a medium amount of serosanguineous drainage noted. There is no granulation within the wound  bed. There is a large (67-100%) amount of necrotic tissue within the wound bed including Eschar and Adherent Slough. Assessment Active Problems ICD-10 Pressure ulcer of sacral region, unstageable Pressure ulcer of other site, unstageable Anoxic brain damage, not elsewhere classified Atherosclerotic  heart disease of native coronary artery without angina pectoris Procedures Wound #1 Pre-procedure diagnosis of Wound #1 is a Pressure Ulcer located on the Coccyx . There was a Excisional Skin/Subcutaneous Tissue Debridement GENE, GLAZEBROOK. ([id number]) with a total area of 37.8 sq cm performed by Tommie Sams., PA-C. With the following instrument(s): Blade, and Forceps to remove Viable and Non-Viable tissue/material. Material removed includes Subcutaneous Tissue and Slough and. No specimens were taken. A time out was conducted at 10:15, prior to the start of the procedure. A Minimum amount of bleeding was controlled with Pressure. The procedure was tolerated well with a pain level of 0 throughout and a pain level of 0 following the procedure. Post Debridement Measurements: 9cm length x 4.2cm width x 2.5cm depth; 74.22cm^3 volume. Post debridement Stage noted as Unstageable/Unclassified. Character of Wound/Ulcer Post Debridement is improved. Post procedure Diagnosis Wound #1: Same as Pre-Procedure Plan Follow-up Appointments: Return Appointment in 1 week. - per patient choice will be seen in 2 weeks Bathing/ Shower/ Hygiene: May shower; gently cleanse wound with antibacterial soap, rinse and pat dry prior to dressing wounds Laboratory ordered were: Wound culture routine - non healing wound Radiology ordered were: X-ray, coccyx - nonhealing wound with foul odor The following medication(s) was prescribed: Dakin's Solution miscellaneous 0.25 % solution Moisten gauze with Dakin's solution then wring out leaving gauze damp not saturated before packing daily into the wound as directed in clini  starting 07/14/2021 WOUND #1: - Sacrum Wound Laterality: Primary Dressing: Gauze 1 x Per Day/30 Days Discharge Instructions: moistened with Dakins Solution 1/4 strngth Secondary Dressing: ABD Pad 5x9 (in/in) (Generic) 1 x Per Day/30 Days Discharge Instructions: Cover with ABD pad Secured With: Medipore Tape - 91M Medipore H Soft Cloth Surgical Tape, 2x2 (in/yd) (Generic) 1 x Per Day/30 Days WOUND #2: - Foot Wound Laterality: Left, Lateral Primary Dressing: Gauze 1 x Per Day/30 Days Discharge Instructions: moistened with Dakins Solution 1/4 strngth Secondary Dressing: ABD Pad 5x9 (in/in) (Generic) 1 x Per Day/30 Days Discharge Instructions: Cover with ABD pad Secured With: Medipore Tape - 91M Medipore H Soft Cloth Surgical Tape, 2x2 (in/yd) (Generic) 1 x Per Day/30 Days WOUND #3: - Knee Wound Laterality: Right Primary Dressing: Gauze 1 x Per Day/30 Days Discharge Instructions: moistened with Dakins Solution 1/4 strngth Secondary Dressing: ABD Pad 5x9 (in/in) (Generic) 1 x Per Day/30 Days Discharge Instructions: Cover with ABD pad Secured With: Medipore Tape - 91M Medipore H Soft Cloth Surgical Tape, 2x2 (in/yd) (Generic) 1 x Per Day/30 Days 1. Based on what I am seeing I do believe that the patient would benefit currently from Dakin's moistened gauze packing to the sacral area which I think will help to clean this up further will also help with the odor. 2. With regard to the other wounds we can also use the Dakin's moistened gauze here as well which I think is going to be appropriate currently. 3. I would also suggest at this point that we have the patient continue to monitor for any signs of worsening infection if it helps any fevers, chills, nausea, vomiting, or diarrhea he may need to go to the hospital ASAP for further evaluation and treatment. 4. I did also perform a culture today we will see what this shows and make any adjustments in therapy as needed following. 5. Also did recommend  that the patient needs to have appropriate offloading the good news as he is able to ambulate now without complication so this should not be a  significant problem for him to be on. Electronic Signature(s) Signed: 07/14/2021 5:12:38 PM By: Worthy Keeler PA-C Entered By: Worthy Keeler on 07/14/2021 17:12:37 Jonathan Mcmillan ([id number]) -------------------------------------------------------------------------------- ROS/PFSH Details Patient Name: Jonathan Mcmillan. Date of Service: 07/14/2021 8:45 AM Medical Record Number: [id number] Patient Account Number: [id number] Date of Birth/Sex: 14-Jul-1995 (26 y.o. M) Treating RN: Cornell Barman Primary Care Provider: Denton Lank Other Clinician: Referring Provider: Delman Kitten Treating Provider/Extender: Skipper Cliche in Treatment: 0 Information Obtained From Patient Chart Gastrointestinal Complaints and Symptoms: Negative for: Frequent diarrhea; Nausea; Vomiting Cardiovascular Medical History: Positive for: Hypertension Endocrine Medical History: Positive for: Type I Diabetes Time with diabetes: unknown per patient Treated with: Insulin Blood sugar tested every day: Yes Tested : Blood sugar testing results: Bedtime: 112 Genitourinary Medical History: 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, 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, 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 Neurologic Medical History: Positive for: Neuropathy - hands and feet Immunizations Pneumococcal Vaccine: Received Pneumococcal Vaccination: Yes Received Pneumococcal Vaccination On or After 60th Birthday: No Implantable Devices None Family and Social History Current every day smoker; Alcohol Use: Rarely; 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 Date of Service: 07/14/2021 Medical Record Number: [id number] Patient Account Number: [id number] Date of Birth/Sex: 1996/03/19 (26 y.o. 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 Facility Procedures CPT4 Code: 35329924 Description: 99213 - WOUND CARE VISIT-LEV 3 EST PT Modifier: Quantity: 1 CPT4 Code: 26834196 Description: 11042 - DEB SUBQ TISSUE 20 SQ CM/< Modifier: Quantity: 1 CPT4 Code: Description: ICD-10 Diagnosis Description L89.150 Pressure ulcer of sacral region, unstageable Modifier: Quantity: CPT4 Code: 22297989 Description: 21194 - DEB SUBQ TISS EA ADDL 20CM Modifier: Quantity: 1 CPT4 Code: Description: ICD-10 Diagnosis Description L89.150 Pressure ulcer of sacral region, unstageable Modifier: Quantity: Physician Procedures CPT4 Code: 1740814 Description: 48185 - WC PHYS LEVEL 4 - NEW PT Modifier: 25 Quantity: 1 CPT4 Code: Description: ICD-10 Diagnosis Description L89.150 Pressure ulcer of sacral region, unstageable L89.890 Pressure ulcer of other site, unstageable 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, not elsewhere classified I25.10 Atherosclerotic heart disease of native coronary  artery without angina p Modifier: ectoris Quantity: CPT4 Code: 6314970 Description: 26378 - WC PHYS SUBQ TISS 20 SQ CM Modifier: Quantity: 1 CPT4 Code: Description: ICD-10 Diagnosis Description L89.150 Pressure ulcer of sacral  region, unstageable Modifier: Quantity: CPT4 Code: 5885027 Description: 11045 - WC PHYS SUBQ TISS EA ADDL 20 CM Modifier: Quantity: 1 CPT4 Code: Description: ICD-10 Diagnosis Description L89.150 Pressure ulcer of sacral region, unstageable Modifier: Quantity: Electronic Signature(s) Signed: 07/14/2021 5:13:21 PM By: Worthy Keeler PA-C Entered By: Worthy Keeler on 07/14/2021 17:13:21

## 2021-07-14 NOTE — Progress Notes (Signed)
Jonathan Mcmillan, Jonathan Mcmillan ([id number]) Visit Report for 07/14/2021 Allergy List Details Patient Name: [name], VI. Date of Service: 07/14/2021 8:45 AM Medical Record Number: [id number] Patient Account Number: [id number] Date of Birth/Sex: 24-Jun-1995 (25 y.o. M) Treating RN: Cornell Barman Primary Care Katherleen Folkes: Denton Lank Other Clinician: Referring Shepherd Finnan: Delman Kitten Treating Sheriden Archibeque/Extender: Skipper Cliche in Treatment: 0 Allergies Active Allergies No Known Drug Allergies Allergy Notes Electronic Signature(s) Signed: 07/14/2021 5:12:48 PM By: Gretta Cool, BSN, RN, CWS, Kim RN, BSN Entered By: Gretta Cool, BSN, RN, CWS, Kim on 07/14/2021 09:05:10 Jonathan Mcmillan ([id number]) -------------------------------------------------------------------------------- Arrival Information Details Patient Name: Jonathan Mcmillan, Jonathan Mcmillan. Date of Service: 07/14/2021 8:45 AM Medical Record Number: [id number] Patient Account Number: [id number] Date of Birth/Sex: October 18, 1995 (25 y.o. M) Treating RN: Cornell Barman Primary Care Deb Loudin: Denton Lank Other Clinician: Referring Vickey Boak: Delman Kitten Treating Tiffanye Hartmann/Extender: Skipper Cliche in Treatment: 0 Visit Information Patient Arrived: Charlyn Minerva Time: 09:00 Accompanied By: self Transfer Assistance: None Patient Identification Verified: Yes Secondary Verification Process Completed: Yes Patient Has Alerts: Yes Patient Alerts: Type 2 diabetic NON COMPRESSABLE Electronic Signature(s) Signed: 07/14/2021 9:33:09 AM By: Carlene Coria RN Entered By: Carlene Coria on 07/14/2021 09:33:09 Jonathan Mcmillan ([id number]) -------------------------------------------------------------------------------- Clinic Level of Care Assessment Details Patient Name: Jonathan Mcmillan. Date of Service: 07/14/2021 8:45 AM Medical Record Number: [id number] Patient Account Number: [id number] Date of Birth/Sex: 1995-08-19 (25 y.o. M) Treating RN: Carlene Coria Primary Care Jazmyne Beauchesne: Denton Lank  Other Clinician: Referring Binh Doten: Delman Kitten Treating Delmi Fulfer/Extender: Skipper Cliche in Treatment: 0 Clinic Level of Care Assessment Items TOOL 1 Quantity Score X - Use when EandM and Procedure is performed on INITIAL visit 1 0 ASSESSMENTS - Nursing Assessment / Reassessment X - General Physical Exam (combine w/ comprehensive assessment (listed just below) when performed on new 1 20 pt. evals) X- 1 25 Comprehensive Assessment (HX, ROS, Risk Assessments, Wounds Hx, etc.) ASSESSMENTS - Wound and Skin Assessment / Reassessment []  - Dermatologic / Skin Assessment (not related to wound area) 0 ASSESSMENTS - Ostomy and/or Continence Assessment and Care []  - Incontinence Assessment and Management 0 []  - 0 Ostomy Care Assessment and Management (repouching, etc.) PROCESS - Coordination of Care X - Simple Patient / Family Education for ongoing care 1 15 []  - 0 Complex (extensive) Patient / Family Education for ongoing care X- 1 10 Staff obtains Programmer, systems, Records, Test Results / Process Orders []  - 0 Staff telephones HHA, Nursing Homes / Clarify orders / etc []  - 0 Routine Transfer to another Facility (non-emergent condition) []  - 0 Routine Hospital Admission (non-emergent condition) X- 1 15 New Admissions / Biomedical engineer / Ordering NPWT, Apligraf, etc. []  - 0 Emergency Hospital Admission (emergent condition) PROCESS - Special Needs []  - Pediatric / Minor Patient Management 0 []  - 0 Isolation Patient Management []  - 0 Hearing / Language / Visual special needs []  - 0 Assessment of Community assistance (transportation, D/C planning, etc.) []  - 0 Additional assistance / Altered mentation []  - 0 Support Surface(s) Assessment (bed, cushion, seat, etc.) INTERVENTIONS - Miscellaneous []  - External ear exam 0 []  - 0 Patient Transfer (multiple staff / Civil Service fast streamer / Similar devices) []  - 0 Simple Staple / Suture removal (25 or less) []  - 0 Complex Staple / Suture  removal (26 or more) []  - 0 Hypo/Hyperglycemic Management (do not check if billed separately) X- 1 15 Ankle / Brachial Index (ABI) - do not check if billed separately Has the patient been seen at the hospital within the  last three years: Yes Total Score: 100 Level Of Care: New/Established - Level 3 TOBIAS, AVITABILE ([id number]) Electronic Signature(s) Signed: 07/14/2021 1:46:31 PM By: Carlene Coria RN Entered By: Carlene Coria on 07/14/2021 10:53:33 Jonathan Mcmillan ([id number]) -------------------------------------------------------------------------------- Encounter Discharge Information Details Patient Name: Jonathan Mcmillan, Jonathan Mcmillan. Date of Service: 07/14/2021 8:45 AM Medical Record Number: [id number] Patient Account Number: [id number] Date of Birth/Sex: 10-Nov-1995 (25 y.o. M) Treating RN: Carlene Coria Primary Care Jaquita Bessire: Denton Lank Other Clinician: Referring Leidi Astle: Delman Kitten Treating Ayeza Therriault/Extender: Skipper Cliche in Treatment: 0 Encounter Discharge Information Items Post Procedure Vitals Discharge Condition: Stable Temperature (F): 98.4 Ambulatory Status: Walker Pulse (bpm): 80 Discharge Destination: Home Respiratory Rate (breaths/min): 18 Transportation: Private Auto Blood Pressure (mmHg): 122/84 Accompanied By: self Schedule Follow-up Appointment: Yes Clinical Summary of Care: Patient Declined Electronic Signature(s) Signed: 07/14/2021 2:10:26 PM By: Carlene Coria RN Entered By: Carlene Coria on 07/14/2021 14:10:26 Jonathan Mcmillan ([id number]) -------------------------------------------------------------------------------- Lower Extremity Assessment Details Patient Name: Jonathan Mcmillan. Date of Service: 07/14/2021 8:45 AM Medical Record Number: [id number] Patient Account Number: [id number] Date of Birth/Sex: 1995/06/12 (25 y.o. M) Treating RN: Carlene Coria Primary Care Dodi Leu: Denton Lank Other Clinician: Referring Casimir Barcellos: Delman Kitten Treating  Gracelin Weisberg/Extender: Skipper Cliche in Treatment: 0 Edema Assessment Assessed: [Left: No] [Right: No] Edema: [Left: N] [Right: o] Calf Left: Right: Point of Measurement: 35 cm From Medial Instep 32 cm Ankle Left: Right: Point of Measurement: 10 cm From Medial Instep 21 cm Knee To Floor Left: Right: From Medial Instep 48 cm Vascular Assessment Pulses: Dorsalis Pedis Palpable: [Left:Yes Yes] Notes non compressable Electronic Signature(s) Signed: 07/14/2021 9:32:07 AM By: Carlene Coria RN Entered By: Carlene Coria on 07/14/2021 09:32:06 Jonathan Mcmillan ([id number]) -------------------------------------------------------------------------------- Multi Wound Chart Details Patient Name: Jonathan Mcmillan. Date of Service: 07/14/2021 8:45 AM Medical Record Number: [id number] Patient Account Number: [id number] Date of Birth/Sex: [birth year] (25 y.o. M) Treating RN: Carlene Coria Primary Care Hagan Vanauken: Denton Lank Other Clinician: Referring Myna Freimark: Delman Kitten Treating Teneil Shiller/Extender: Skipper Cliche in Treatment: 0 Vital Signs Height(in): 48 Pulse(bpm): 80 Weight(lbs): 146 Blood Pressure(mmHg): 122/84 Body Mass Index(BMI): 22.2 Temperature(F): 98.4 Respiratory Rate(breaths/min): 18 Photos: Wound Location: Coccyx Left, Lateral Foot Right Knee Wounding Event: Gradually Appeared Gradually Appeared Gradually Appeared Primary Etiology: Pressure Ulcer Pressure Ulcer Pressure Ulcer Comorbid History: Hypertension, Type I Diabetes, Hypertension, Type I Diabetes, Hypertension, Type I Diabetes, Neuropathy Neuropathy Neuropathy Date Acquired: 05/31/2021 05/31/2021 05/31/2021 Weeks of Treatment: 0 0 0 Wound Status: Open Open Open Wound Recurrence: No No No Measurements L x W x D (cm) 9x4.2x2.5 2x5x0.2 0.7x4x0.2 Area (cm) : 29.688 7.854 2.199 Volume (cm) : 74.22 1.571 0.44 Classification: Unstageable/Unclassified Unstageable/Unclassified Unstageable/Unclassified Exudate Amount:  Medium Medium Medium Exudate Type: Serosanguineous Serosanguineous Serosanguineous Exudate Color: red, brown red, brown red, brown Foul Odor After Cleansing: Yes Yes No Odor Anticipated Due to Product No No N/A Use: Granulation Amount: Small (1-33%) None Present (0%) None Present (0%) Granulation Quality: Pink N/A N/A Necrotic Amount: Large (67-100%) Large (67-100%) Large (67-100%) Necrotic Tissue: Eschar, Adherent [place] Exposed Structures: Fat Layer (Subcutaneous Tissue): Fascia: No Fascia: No Yes Fat Layer (Subcutaneous Tissue): Fat Layer (Subcutaneous Tissue): Fascia: No No No Tendon: No Tendon: No Tendon: No Muscle: No Muscle: No Muscle: No Joint: No Joint: No Joint: No Bone: No Bone: No Bone: No Epithelialization: None None None Treatment Notes Electronic Signature(s) Signed: 07/14/2021 1:46:31 PM By: Carlene Coria RN Entered By: Carlene Coria on 07/14/2021 10:10:50 Jonathan Mcmillan. (  [id number]) -------------------------------------------------------------------------------- [place] Details Patient Name: Jonathan Mcmillan, Jonathan Mcmillan. Date of Service: 07/14/2021 8:45 AM Medical Record Number: [id number] Patient Account Number: [id number] Date of Birth/Sex: Aug 04, 1995 (25 y.o. M) Treating RN: Carlene Coria Primary Care Vilda Zollner: Denton Lank Other Clinician: Referring Shaina Gullatt: Delman Kitten Treating Ruhama Lehew/Extender: Skipper Cliche in Treatment: 0 Active Inactive Wound/Skin Impairment Nursing Diagnoses: Knowledge deficit related to ulceration/compromised skin integrity Goals: Patient/caregiver will verbalize understanding of skin care regimen Date Initiated: 07/14/2021 Target Resolution Date: 08/14/2021 Goal Status: Active Ulcer/skin breakdown will have a volume reduction of 30% by week 4 Date Initiated: 07/14/2021 Target Resolution Date: 09/13/2021 Goal Status: Active Ulcer/skin breakdown will have a volume  reduction of 50% by week 8 Date Initiated: 07/14/2021 Target Resolution Date: 10/14/2021 Goal Status: Active Ulcer/skin breakdown will have a volume reduction of 80% by week 12 Date Initiated: 07/14/2021 Target Resolution Date: 11/13/2021 Goal Status: Active Ulcer/skin breakdown will heal within 14 weeks Date Initiated: 07/14/2021 Target Resolution Date: 01/03/2022 Goal Status: Active Interventions: Assess patient/caregiver ability to obtain necessary supplies Assess patient/caregiver ability to perform ulcer/skin care regimen upon admission and as needed Assess ulceration(s) every visit Notes: Electronic Signature(s) Signed: 07/14/2021 1:46:31 PM By: Carlene Coria RN Entered By: Carlene Coria on 07/14/2021 10:10:34 Jonathan Mcmillan ([id number]) -------------------------------------------------------------------------------- Pain Assessment Details Patient Name: Jonathan Mcmillan. Date of Service: 07/14/2021 8:45 AM Medical Record Number: [id number] Patient Account Number: [id number] Date of Birth/Sex: 1995-11-22 (25 y.o. M) Treating RN: Cornell Barman Primary Care Revel Stellmach: Denton Lank Other Clinician: Referring Ferrin Liebig: Delman Kitten Treating Tamaiya Bump/Extender: Skipper Cliche in Treatment: 0 Active Problems Location of Pain Severity and Description of Pain Patient Has Paino Yes Site Locations Pain Location: Pain in Ulcers Duration of the Pain. Constant / Intermittento Constant Rate the pain. Current Pain Level: 6 Character of Pain Describe the Pain: Aching, Throbbing Pain Management and Medication Current Pain Management: Electronic Signature(s) Signed: 07/14/2021 5:12:48 PM By: Gretta Cool, BSN, RN, CWS, Kim RN, BSN Entered By: Gretta Cool, BSN, RN, CWS, Kim on 07/14/2021 09:03:54 Jonathan Mcmillan ([id number]) -------------------------------------------------------------------------------- Patient/Caregiver Education Details Patient Name: Jonathan Mcmillan. Date of Service: 07/14/2021 8:45  AM Medical Record Number: [id number] Patient Account Number: [id number] Date of Birth/Gender: 1996/01/29 (25 y.o. M) Treating RN: Carlene Coria Primary Care Physician: Denton Lank Other Clinician: Referring Physician: Delman Kitten Treating Physician/Extender: Skipper Cliche in Treatment: 0 Education Assessment Education Provided To: Patient Education Topics Provided Wound/Skin Impairment: Methods: Explain/Verbal Responses: State content correctly Electronic Signature(s) Signed: 07/14/2021 1:46:31 PM By: Carlene Coria RN Entered By: Carlene Coria on 07/14/2021 10:56:32 Jonathan Mcmillan ([id number]) -------------------------------------------------------------------------------- Wound Assessment Details Patient Name: Jonathan Mcmillan. Date of Service: 07/14/2021 8:45 AM Medical Record Number: [id number] Patient Account Number: [id number] Date of Birth/Sex: [birth year] (25 y.o. M) Treating RN: Carlene Coria Primary Care Jiles Goya: Denton Lank Other Clinician: Referring Jackolyn Geron: Delman Kitten Treating Windy Dudek/Extender: Skipper Cliche in Treatment: 0 Wound Status Wound Number: 1 Primary Etiology: Pressure Ulcer Wound Location: Coccyx Wound Status: Open Wounding Event: Gradually Appeared Comorbid History: Hypertension, Type I Diabetes, Neuropathy Date Acquired: 05/31/2021 Weeks Of Treatment: 0 Clustered Wound: No Photos Wound Measurements Length: (cm) 9 Width: (cm) 4.2 Depth: (cm) 2.5 Area: (cm) 29.688 Volume: (cm) 74.22 % Reduction in Area: % Reduction in Volume: Epithelialization: None Tunneling: No Undermining: No Wound Description Classification: Unstageable/Unclassified Exudate Amount: Medium Exudate Type: Serosanguineous Exudate Color: red, brown Foul Odor After Cleansing: Yes Due to Product Use: No Slough/Fibrino Yes Wound Bed Granulation Amount: Small (1-33%) Exposed Structure Granulation Quality: Pink  Fascia Exposed: No Necrotic Amount: Large  (67-100%) Fat Layer (Subcutaneous Tissue) Exposed: Yes Necrotic Quality: Eschar, Adherent Slough Tendon Exposed: No Muscle Exposed: No Joint Exposed: No Bone Exposed: No Electronic Signature(s) Signed: 07/14/2021 9:28:09 AM By: Carlene Coria RN Entered By: Carlene Coria on 07/14/2021 09:28:09 Jonathan Mcmillan ([id number]) -------------------------------------------------------------------------------- Wound Assessment Details Patient Name: Jonathan Mcmillan. Date of Service: 07/14/2021 8:45 AM Medical Record Number: [id number] Patient Account Number: [id number] Date of Birth/Sex: 02/25/1996 (25 y.o. M) Treating RN: Carlene Coria Primary Care Dustine Stickler: Denton Lank Other Clinician: Referring Emelio Schneller: Delman Kitten Treating Lamyiah Crawshaw/Extender: Skipper Cliche in Treatment: 0 Wound Status Wound Number: 2 Primary Etiology: Pressure Ulcer Wound Location: Left, Lateral Foot Wound Status: Open Wounding Event: Gradually Appeared Comorbid History: Hypertension, Type I Diabetes, Neuropathy Date Acquired: 05/31/2021 Weeks Of Treatment: 0 Clustered Wound: No Photos Wound Measurements Length: (cm) 2 % Reduc Width: (cm) 5 % Reduc Depth: (cm) 0.2 Epithel Area: (cm) 7.854 Tunnel Volume: (cm) 1.571 Underm tion in Area: tion in Volume: ialization: None ing: No ining: No Wound Description Classification: Unstageable/Unclassified Foul O Exudate Amount: Medium Due to Exudate Type: Serosanguineous Slough Exudate Color: red, brown dor After Cleansing: Yes Product Use: No /Fibrino Yes Wound Bed Granulation Amount: None Present (0%) Exposed Structure Necrotic Amount: Large (67-100%) Fascia Exposed: No Necrotic Quality: Eschar, Adherent Slough Fat Layer (Subcutaneous Tissue) Exposed: No Tendon Exposed: No Muscle Exposed: No Joint Exposed: No Bone Exposed: No Treatment Notes Wound #2 (Foot) Wound Laterality: Left, Lateral Cleanser Peri-Wound Care Topical Primary Dressing MITUL, HALLOWELL ([id number]) Gauze Discharge Instruction: moistened with Dakins Solution 1/4 strngth Secondary Dressing ABD Pad 5x9 (in/in) Discharge Instruction: Cover with ABD pad Secured With 510M Medipore H Soft Cloth Surgical Tape, 2x2 (in/yd) Compression Wrap Compression Stockings Add-Ons Electronic Signature(s) Signed: 07/14/2021 9:29:28 AM By: Carlene Coria RN Entered By: Carlene Coria on 07/14/2021 09:29:28 Jonathan Mcmillan ([id number]) -------------------------------------------------------------------------------- Wound Assessment Details Patient Name: Jonathan Mcmillan. Date of Service: 07/14/2021 8:45 AM Medical Record Number: [id number] Patient Account Number: [id number] Date of Birth/Sex: 07-Jan-1996 (25 y.o. M) Treating RN: Carlene Coria Primary Care Winola Drum: Denton Lank Other Clinician: Referring Zulma Court: Delman Kitten Treating Taeshawn Helfman/Extender: Skipper Cliche in Treatment: 0 Wound Status Wound Number: 3 Primary Etiology: Pressure Ulcer Wound Location: Right Knee Wound Status: Open Wounding Event: Gradually Appeared Comorbid History: Hypertension, Type I Diabetes, Neuropathy Date Acquired: 05/31/2021 Weeks Of Treatment: 0 Clustered Wound: No Photos Wound Measurements Length: (cm) 0.7 % Reduc Width: (cm) 4 % Reduc Depth: (cm) 0.2 Epithel Area: (cm) 2.199 Tunnel Volume: (cm) 0.44 Underm tion in Area: tion in Volume: ialization: None ing: No ining: No Wound Description Classification: Unstageable/Unclassified Foul O Exudate Amount: Medium Slough Exudate Type: Serosanguineous Exudate Color: red, brown dor After Cleansing: No /Fibrino Yes Wound Bed Granulation Amount: None Present (0%) Exposed Structure Necrotic Amount: Large (67-100%) Fascia Exposed: No Necrotic Quality: Eschar, Adherent Slough Fat Layer (Subcutaneous Tissue) Exposed: No Tendon Exposed: No Muscle Exposed: No Joint Exposed: No Bone Exposed: No Treatment Notes Wound #3 (Knee) Wound  Laterality: Right Cleanser Peri-Wound Care Topical Primary Dressing LORA, GLOMSKI ([id number]) Gauze Discharge Instruction: moistened with Dakins Solution 1/4 strngth Secondary Dressing ABD Pad 5x9 (in/in) Discharge Instruction: Cover with ABD pad Secured With 510M Medipore H Soft Cloth Surgical Tape, 2x2 (in/yd) Compression Wrap Compression Stockings Add-Ons Electronic Signature(s) Signed: 07/14/2021 9:31:13 AM By: Carlene Coria RN Entered By: Carlene Coria on 07/14/2021 09:31:13 Jonathan Mcmillan ([id number]) -------------------------------------------------------------------------------- Vitals Details Patient Name: Jonathan Mcmillan. Date  of Service: 07/14/2021 8:45 AM Medical Record Number: [id number] Patient Account Number: [id number] Date of Birth/Sex: 30-Nov-1995 (25 y.o. M) Treating RN: Cornell Barman Primary Care Estanislado Surgeon: Denton Lank Other Clinician: Referring Freman Lapage: Delman Kitten Treating Magdalen Cabana/Extender: Skipper Cliche in Treatment: 0 Vital Signs Time Taken: 09:04 Temperature (F): 98.4 Height (in): 68 Pulse (bpm): 80 Weight (lbs): 146 Respiratory Rate (breaths/min): 18 Body Mass Index (BMI): 22.2 Blood Pressure (mmHg): 122/84 Reference Range: 80 - 120 mg / dl Electronic Signature(s) Signed: 07/14/2021 5:12:48 PM By: Gretta Cool, BSN, RN, CWS, Kim RN, BSN Entered By: Gretta Cool, BSN, RN, CWS, Kim on 07/14/2021 09:04:38

## 2021-07-17 NOTE — Progress Notes (Deleted)
?Cardiology Office Note:   ? ?Date:  07/17/2021  ? ?ID:  Jonathan Mcmillan, DOB 04-Apr-1996, MRN [id number] ? ?PCP:  Denton Lank, MD  ?Cardiologist:  None  ? ?Referring MD: Center, DelwayNo chief complaint on file. ? ? ?History of Present Illness:   ? ?Jonathan Mcmillan is a 26 y.o. male with a hx of *** ? ?Past Medical History:  ?Diagnosis Date  ? Cannabinoid hyperemesis syndrome   ? CKD (chronic kidney disease), stage IV ([place])   ? Diabetes 1.5, managed as type 1 ([place])   ? DKA (diabetic ketoacidoses) 05/21/2016  ? Gastroparesis   ? HTN (hypertension)   ? Nicotine dependence   ? Perirectal abscess 06/16/2017  ? Right arm cellulitis 01/26/2018  ? ? ?Past Surgical History:  ?Procedure Laterality Date  ? COLONOSCOPY WITH PROPOFOL N/A 06/21/2021  ? Procedure: COLONOSCOPY WITH PROPOFOL;  Surgeon: Lin Landsman, MD;  Location: [facility] END[facility];  Service: Gastroenterology;  Laterality: N/A;  ? DIALYSIS/PERMA CATHETER INSERTION N/A 06/15/2021  ? Procedure: DIALYSIS/PERMA CATHETER INSERTION;  Surgeon: Algernon Huxley, MD;  Location: [place] [facility];  Service: Cardiovascular;  Laterality: N/A;  ? DIALYSIS/PERMA CATHETER INSERTION N/A 06/19/2021  ? Procedure: DIALYSIS/PERMA CATHETER INSERTION;  Surgeon: Algernon Huxley, MD;  Location: [place] [facility];  Service: Cardiovascular;  Laterality: N/A;  ? INCISION AND DRAINAGE PERIRECTAL ABSCESS N/A 06/16/2017  ? Procedure: IRRIGATION AND DEBRIDEMENT PERIRECTAL ABSCESS;  Surgeon: Florene Glen, MD;  Location: [facility] ORS;  Service: General;  Laterality: N/A;  ? none    ? ? ?Current Medications: ?No outpatient medications have been marked as taking for the 07/18/21 encounter (Appointment) with Belva Crome, MD.  ?  ? ?Allergies:   Patient has no known allergies.  ? ?Social History  ? ?Socioeconomic History  ? Marital status: Single  ?  Spouse name: Not on file  ? Number of children: Not on file  ? Years of education: Not on file  ? Highest education level: Not on file   ?Occupational History  ? Occupation: unemployed  ?Tobacco Use  ? Smoking status: Every Day  ?  Packs/day: 0.50  ?  Types: Cigarettes  ? Smokeless tobacco: Never  ?Vaping Use  ? Vaping Use: Never used  ?Substance and Sexual Activity  ? Alcohol use: No  ? Drug use: Yes  ?  Types: Marijuana  ? Sexual activity: Not on file  ?Other Topics Concern  ? Not on file  ?Social History Narrative  ? Not on file  ? ?Social Determinants of Health  ? ?Financial Resource Strain: Not on file  ?Food Insecurity: Not on file  ?Transportation Needs: Not on file  ?Physical Activity: Not on file  ?Stress: Not on file  ?Social Connections: Not on file  ?  ? ?Family History: ?The patient's family history includes Diabetes in his mother. ? ?ROS:   ?Please see the history of present illness.    ?*** All other systems reviewed and are negative. ? ?EKGs/Labs/Other Studies Reviewed:   ? ?The following studies were reviewed today: ?ECHOCARDIOGRAPHY 06/05/2021 ?IMPRESSIONS  ? ? ? 1. Left ventricular ejection fraction, by estimation, is 35 to 40%. The  ?left ventricle has moderately decreased function. The left ventricle  ?demonstrates global hypokinesis. There is mild left ventricular  ?hypertrophy. Left ventricular diastolic  ?parameters are indeterminate.  ? 2. Right ventricular systolic function is mildly reduced. The right  ?ventricular size is normal. There is normal pulmonary artery systolic  ?  pressure. The estimated right ventricular systolic pressure is 57.2 mmHg.  ? 3. The mitral valve is normal in structure. Mild mitral valve  ?regurgitation. No evidence of mitral stenosis.  ? 4. Tricuspid valve regurgitation is mild to moderate.  ? 5. The aortic valve has an indeterminant number of cusps. Aortic valve  ?regurgitation is mild. No aortic stenosis is present.  ? 6. The inferior vena cava is normal in size with greater than 50%  ?respiratory variability, suggesting right atrial pressure of 3 mmHg.  ?EKG:  EKG *** ? ?Recent Labs: ?06/04/2021: B  Natriuretic Peptide 3,692.3 ?06/16/2021: Magnesium 2.3 ?07/05/2021: ALT 10; BUN 12; Creatinine, Ser 2.14; Hemoglobin 6.6; Platelets 207; Potassium 3.7; Sodium 138  ?Recent Lipid Panel ?   ?Component Value Date/Time  ? TRIG 110 06/08/2021 0407  ? ? ?Physical Exam:   ? ?VS:  There were no vitals taken for this visit.   ? ?Wt Readings from Last 3 Encounters:  ?07/13/21 143 lb 9.6 oz (65.1 kg)  ?07/05/21 155 lb 6.8 oz (70.5 kg)  ?06/29/21 155 lb 6.8 oz (70.5 kg)  ?  ? ?GEN: ***. No acute distress ?HEENT: Normal ?NECK: No JVD. ?LYMPHATICS: No lymphadenopathy ?CARDIAC: *** murmur. RRR *** gallop, or edema. ?VASCULAR: *** Normal Pulses. No bruits. ?RESPIRATORY:  Clear to auscultation without rales, wheezing or rhonchi  ?ABDOMEN: Soft, non-tender, non-distended, No pulsatile mass, ?MUSCULOSKELETAL: No deformity  ?SKIN: Warm and dry ?NEUROLOGIC:  Alert and oriented x 3 ?PSYCHIATRIC:  Normal affect  ? ?ASSESSMENT:   ? ?1. Chronic combined systolic and diastolic CHF (congestive heart failure) ([place])   ?2. Paroxysmal atrial fibrillation ([place])   ?3. Primary hypertension   ?4. Elevated troponin   ?5. Type 1 diabetes mellitus with complication (HCC)   ?6. Polysubstance abuse ([place])   ?7. ESRD (end stage renal disease) ([place])   ?8. Anoxic encephalopathy ([place])   ? ?PLAN:   ? ?In order of problems listed above: ? ?*** ? ? ?Medication Adjustments/Labs and Tests Ordered: ?Current medicines are reviewed at length with the patient today.  Concerns regarding medicines are outlined above.  ?No orders of the defined types were placed in this encounter. ? ?No orders of the defined types were placed in this encounter. ? ? ?There are no Patient Instructions on file for this visit.  ? ?Signed, ?Sinclair Grooms, MD  ?07/17/2021 7:12 PM    ?[place] ?

## 2021-07-18 ENCOUNTER — Ambulatory Visit: Payer: Self-pay | Admitting: Physician Assistant

## 2021-07-18 ENCOUNTER — Ambulatory Visit: Payer: Self-pay | Admitting: Interventional Cardiology

## 2021-07-18 DIAGNOSIS — I10 Essential (primary) hypertension: Secondary | ICD-10-CM

## 2021-07-18 DIAGNOSIS — F1910 Other psychoactive substance abuse, uncomplicated: Secondary | ICD-10-CM

## 2021-07-18 DIAGNOSIS — R778 Other specified abnormalities of plasma proteins: Secondary | ICD-10-CM

## 2021-07-18 DIAGNOSIS — I5042 Chronic combined systolic (congestive) and diastolic (congestive) heart failure: Secondary | ICD-10-CM

## 2021-07-18 DIAGNOSIS — G931 Anoxic brain damage, not elsewhere classified: Secondary | ICD-10-CM

## 2021-07-18 DIAGNOSIS — I480 Paroxysmal atrial fibrillation: Secondary | ICD-10-CM

## 2021-07-18 DIAGNOSIS — E108 Type 1 diabetes mellitus with unspecified complications: Secondary | ICD-10-CM

## 2021-07-18 DIAGNOSIS — N186 End stage renal disease: Secondary | ICD-10-CM

## 2021-07-18 LAB — AEROBIC CULTURE W GRAM STAIN (SUPERFICIAL SPECIMEN)

## 2021-07-25 ENCOUNTER — Ambulatory Visit
Admission: RE | Admit: 2021-07-25 | Discharge: 2021-07-25 | Disposition: A | Discharge status: Home / Self Care | Payer: Medicaid Other | Source: Ambulatory Visit | Attending: Physician Assistant | Admitting: Physician Assistant

## 2021-07-25 ENCOUNTER — Other Ambulatory Visit: Payer: Self-pay

## 2021-07-25 ENCOUNTER — Other Ambulatory Visit: Payer: Self-pay | Admitting: Physician Assistant

## 2021-07-25 ENCOUNTER — Encounter (HOSPITAL_BASED_OUTPATIENT_CLINIC_OR_DEPARTMENT_OTHER): Payer: Medicaid Other | Admitting: Internal Medicine

## 2021-07-25 DIAGNOSIS — L89890 Pressure ulcer of other site, unstageable: Secondary | ICD-10-CM

## 2021-07-25 DIAGNOSIS — S31809A Unspecified open wound of unspecified buttock, initial encounter: Secondary | ICD-10-CM | POA: Insufficient documentation

## 2021-07-25 DIAGNOSIS — L89150 Pressure ulcer of sacral region, unstageable: Secondary | ICD-10-CM

## 2021-07-25 DIAGNOSIS — G931 Anoxic brain damage, not elsewhere classified: Secondary | ICD-10-CM

## 2021-07-25 NOTE — Progress Notes (Signed)
KARMELLO, ABERCROMBIE ([id number]) ?Visit Report for 07/25/2021 ?Chief Complaint Document Details ?Patient Name: Jonathan Mcmillan, Jonathan Mcmillan. ?Date of Service: 07/25/2021 12:30 PM ?Medical Record Number: [id number] ?Patient Account Number: [id number] ?Date of Birth/Sex: Jul 28, 1995 (26 y.o. M) ?Treating RN: Carlene Coria ?Primary Care Provider: Denton Lank Other Clinician: ?Referring Provider: Denton Lank ?Treating Provider/Extender: Kalman Shan ?Weeks in Treatment: 1 ?Information Obtained from: Patient ?Chief Complaint ?Sacral, left lateral foot, and right knee pressure ulcers ?Electronic Signature(s) ?Signed: 07/25/2021 1:10:12 PM By: Kalman Shan DO ?Entered By: Kalman Shan on 07/25/2021 13:00:57 ?BANE, HAGY. ([id number]) ?-------------------------------------------------------------------------------- ?HPI Details ?Patient Name: Jonathan Mcmillan. ?Date of Service: 07/25/2021 12:30 PM ?Medical Record Number: [id number] ?Patient Account Number: [id number] ?Date of Birth/Sex: Sep 12, 1995 (26 y.o. M) ?Treating RN: Carlene Coria ?Primary Care Provider: Denton Lank Other Clinician: ?Referring Provider: Denton Lank ?Treating Provider/Extender: Kalman Shan ?Weeks in Treatment: 1 ?History of Present Illness ?HPI Description: 07/14/2021 patient presents today for initial inspection here in the clinic concerning an issue that he has been having with ?wounds over the left lateral foot, right knee, and the largest of these is the sacral region where there is significant necrotic tissue noted in the ?base of this wound. Subsequently the patient is referred to [place] from what appears to be primary care in order for further management of his ?wounds. He also sees physical medicine and rehabilitation. I did receive a lot of the notes from there as far as what is been going on with regard ?to this gentleman. ?Patient is a 26 year old male who had an anoxic encephalopathy, end-stage renal disease on hemodialysis, he has been disabled  with generalized ?weakness and subsequently developed a sacral decubitus ulcer which apparently was present before he went into the hospital according to what is ?not tells me but worsened during the time in the ICU simply because he was unable to be turned with all the equipment that he was hooked up to. ?Again it sounds like they did what they could to try to keep this under control but nonetheless due to the severity of his illness it sounds as if he is ?lucky to be alive to be perfectly honest. Subsequently the patient also does have diabetes mellitus, hypertension, nicotine dependence, he did go ?to the emergency department on 07/05/2021 due to a "abscess" in the gluteal region. The family member had seen this and wanted him to be ?checked out. During that time medical staff wanted to admit the patient but he refused. On 07/06/2021 the patient was subsequently discharged ?from the hospital [place] and apparently they were trying to get in touch according to the notes with his aunt who is the legal ?guardian but were not able to. That was around 12:37 AM. Nonetheless in the interim since they have been basically at home using Santyl which ?was the recommendation from the hospital. This has been on all locations. Dry dressings have been applied following. Unfortunately this just does ?not seem to be doing quite enough especially for the sacral area which is a significant wound to be honest. We do not have any sign of an x-ray ?that was done of the sacral area nor that they know of anything in particular that was done at the hospital. With all that being said I think an ?x-ray would be a good idea based on what I am seeing currently this does appear to be quite significant from a wound care perspective. ?3/14; patient presents for follow-up. He has no issues or complaints today. He has  been using Dakin's wet-to-dry dressings to the wound beds. He ?denies signs of infection. He reports Currently taking  Augmentin. ?Electronic Signature(s) ?Signed: 07/25/2021 1:10:12 PM By: Kalman Shan DO ?Entered By: Kalman Shan on 07/25/2021 13:01:34 ?YAMATO, KOPF. ([id number]) ?-------------------------------------------------------------------------------- ?Physical Exam Details ?Patient Name: Mcmillan, [name]. ?Date of Service: 07/25/2021 12:30 PM ?Medical Record Number: [id number] ?Patient Account Number: [id number] ?Date of Birth/Sex: 14-Jun-1995 (26 y.o. M) ?Treating RN: Carlene Coria ?Primary Care Provider: Denton Lank Other Clinician: ?Referring Provider: Denton Lank ?Treating Provider/Extender: Kalman Shan ?Weeks in Treatment: 1 ?Constitutional ?. ?Cardiovascular ?[name] ?Psychiatric ?[name] ?Notes ?Sacrum: Open wound with granulation tissue and nonviable tissue present. ?Left foot: Open wound with nonviable tissue throughout ?Right knee: Open wound with devitalized tissue. ?No surrounding signs of infection To any of the wound beds. ?Electronic Signature(s) ?Signed: 07/25/2021 1:10:12 PM By: Kalman Shan DO ?Entered By: Kalman Shan on 07/25/2021 13:05:51 ?MARKEE, MATERA. ([id number]) ?-------------------------------------------------------------------------------- ?Physician Orders Details ?Patient Name: Jonathan Mcmillan. ?Date of Service: 07/25/2021 12:30 PM ?Medical Record Number: [id number] ?Patient Account Number: [id number] ?Date of Birth/Sex: 1995-12-09 (26 y.o. M) ?Treating RN: Carlene Coria ?Primary Care Provider: Denton Lank Other Clinician: ?Referring Provider: Denton Lank ?Treating Provider/Extender: Kalman Shan ?Weeks in Treatment: 1 ?Verbal / Phone Orders: No ?Diagnosis Coding ?Follow-up Appointments ?o Return Appointment in 1 week. - per patient choice will be seen in 2 weeks ?Bathing/ Shower/ Hygiene ?o May shower; gently cleanse wound with antibacterial soap, rinse and pat dry prior to dressing wounds ?Wound Treatment ?Wound #1 - Sacrum ?Primary Dressing: Gauze 1 x Per Day/30  Days ?Discharge Instructions: moistened with Dakins Solution 1/4 strngth ?Secondary Dressing: ABD Pad 5x9 (in/in) (Generic) 1 x Per Day/30 Days ?Discharge Instructions: Cover with ABD pad ?Secured With: Medipore Tape - 69M Medipore H Soft Cloth Surgical Tape, 2x2 (in/yd) (Generic) 1 x Per Day/30 Days ?Wound #2 - Foot Wound Laterality: Left, Lateral ?Primary Dressing: Gauze 1 x Per Day/30 Days ?Discharge Instructions: moistened with Dakins Solution 1/4 strngth ?Secondary Dressing: ABD Pad 5x9 (in/in) (Generic) 1 x Per Day/30 Days ?Discharge Instructions: Cover with ABD pad ?Secured With: Medipore Tape - 69M Medipore H Soft Cloth Surgical Tape, 2x2 (in/yd) (Generic) 1 x Per Day/30 Days ?Wound #3 - Knee Wound Laterality: Right ?Primary Dressing: Santyl Collagenase Ointment, 30 (gm), tube 1 x Per Day/30 Days ?Secondary Dressing: ABD Pad 5x9 (in/in) (Generic) 1 x Per Day/30 Days ?Discharge Instructions: Cover with ABD pad ?Secured With: Medipore Tape - 69M Medipore H Soft Cloth Surgical Tape, 2x2 (in/yd) (Generic) 1 x Per Day/30 Days ?Electronic Signature(s) ?Signed: 07/25/2021 1:10:12 PM By: Kalman Shan DO ?Entered By: Kalman Shan on 07/25/2021 13:09:48 ?MARX, DOIG. ([id number]) ?-------------------------------------------------------------------------------- ?Problem List Details ?Patient Name: PAZ, WINSETT. ?Date of Service: 07/25/2021 12:30 PM ?Medical Record Number: [id number] ?Patient Account Number: [id number] ?Date of Birth/Sex: 10-18-1995 (26 y.o. 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

## 2021-08-01 ENCOUNTER — Encounter: Payer: Medicaid Other | Admitting: Physician Assistant

## 2021-08-01 ENCOUNTER — Other Ambulatory Visit: Payer: Self-pay

## 2021-08-08 ENCOUNTER — Other Ambulatory Visit: Payer: Self-pay

## 2021-08-08 ENCOUNTER — Encounter: Payer: Medicaid Other | Admitting: Physician Assistant

## 2021-08-08 DIAGNOSIS — L89150 Pressure ulcer of sacral region, unstageable: Secondary | ICD-10-CM | POA: Diagnosis not present

## 2021-08-08 NOTE — Progress Notes (Addendum)
JERMINE, BIBBEE ([id number]) ?Visit Report for 08/08/2021 ?Chief Complaint Document Details ?Patient Name: Jonathan Mcmillan, Jonathan Mcmillan. ?Date of Service: 08/08/2021 10:15 AM ?Medical Record Number: [id number] ?Patient Account Number: [id number] ?Date of Birth/Sex: 01-29-1996 (26 y.o. M) ?Treating RN: Carlene Coria ?Primary Care Provider: Denton Lank Other Clinician: ?Referring Provider: Denton Lank ?Treating Provider/Extender: Jeri Cos ?Weeks in Treatment: 3 ?Information Obtained from: Patient ?Chief Complaint ?Sacral, left lateral foot, and right knee pressure ulcers ?Electronic Signature(s) ?Signed: 08/08/2021 10:41:24 AM By: Worthy Keeler PA-C ?Entered By: Worthy Keeler on 08/08/2021 10:41:24 ?Jonathan Mcmillan, Jonathan Mcmillan. ([id number]) ?-------------------------------------------------------------------------------- ?Debridement Details ?Patient Name: Jonathan Mcmillan, Jonathan Mcmillan. ?Date of Service: 08/08/2021 10:15 AM ?Medical Record Number: [id number] ?Patient Account Number: [id number] ?Date of Birth/Sex: 1995-08-11 (26 y.o. M) ?Treating RN: Cornell Barman ?Primary Care Provider: Denton Lank Other Clinician: ?Referring Provider: Denton Lank ?Treating Provider/Extender: Jeri Cos ?Weeks in Treatment: 3 ?Debridement Performed for ?Wound #1 Sacrum ?Assessment: ?Performed By: Physician Tommie Sams., PA-C ?Debridement Type: Debridement ?Level of Consciousness (Pre- ?Awake and Alert ?procedure): ?Pre-procedure Verification/Time Out ?Yes - 10:45 ?Taken: ?Start Time: 10:45 ?Total Area Debrided (L x W): 1.5 (cm) x 1 (cm) = 1.5 (cm?) ?Tissue and other material ?Viable, Non-Viable, [place], Subcutaneous, [place] ?debrided: ?Level: Skin/Subcutaneous Tissue ?Debridement Description: Excisional ?Instrument: Forceps, Scissors ?Bleeding: Minimum ?Hemostasis Achieved: Pressure ?End Time: 10:48 ?Procedural Pain: 0 ?Post Procedural Pain: 0 ?Response to Treatment: Procedure was tolerated well ?Level of Consciousness (Post- ?Awake and Alert ?procedure): ?Post  Debridement Measurements of Total Wound ?Length: (cm) 7.5 ?Stage: Category/Stage IV ?Width: (cm) 3 ?Depth: (cm) 3.6 ?Volume: (cm?) 63.617 ?Character of Wound/Ulcer Post Debridement: Improved ?Post Procedure Diagnosis ?Same as Pre-procedure ?Electronic Signature(s) ?Signed: 08/09/2021 9:37:53 AM By: Gretta Cool, BSN, RN, CWS, Kim RN, BSN ?Signed: 08/10/2021 4:23:05 PM By: Worthy Keeler PA-C ?Previous Signature: 08/08/2021 10:53:14 AM Version By: Carlene Coria RN ?Previous Signature: 08/08/2021 4:21:45 PM Version By: Worthy Keeler PA-C ?Entered By: Gretta Cool, BSN, RN, CWS, Kim on 08/09/2021 09:37:52 ?Jonathan Mcmillan, Jonathan Mcmillan. ([id number]) ?-------------------------------------------------------------------------------- ?HPI Details ?Patient Name: Jonathan Mcmillan, Jonathan Mcmillan. ?Date of Service: 08/08/2021 10:15 AM ?Medical Record Number: [id number] ?Patient Account Number: [id number] ?Date of Birth/Sex: Apr 30, 1996 (26 y.o. M) ?Treating RN: Carlene Coria ?Primary Care Provider: Denton Lank Other Clinician: ?Referring Provider: Denton Lank ?Treating Provider/Extender: Jeri Cos ?Weeks in Treatment: 3 ?History of Present Illness ?HPI Description: 07/14/2021 patient presents today for initial inspection here in the clinic concerning an issue that he has been having with ?wounds over the left lateral foot, right knee, and the largest of these is the sacral region where there is significant necrotic tissue noted in the ?base of this wound. Subsequently the patient is referred to [place] from what appears to be primary care in order for further management of his ?wounds. He also sees physical medicine and rehabilitation. I did receive a lot of the notes from there as far as what is been going on with regard ?to this gentleman. ?Patient is a 26 year old male who had an anoxic encephalopathy, end-stage renal disease on hemodialysis, he has been disabled with generalized ?weakness and subsequently developed a sacral decubitus ulcer which apparently was present before  he went into the hospital according to what is ?not tells me but worsened during the time in the ICU simply because he was unable to be turned with all the equipment that he was hooked up to. ?Again it sounds like they did what they could to try to keep this under control but nonetheless due to the severity of his illness it  sounds as if he is ?lucky to be alive to be perfectly honest. Subsequently the patient also does have diabetes mellitus, hypertension, nicotine dependence, he did go ?to the emergency department on 07/05/2021 due to a "abscess" in the gluteal region. The family member had seen this and wanted him to be ?checked out. During that time medical staff wanted to admit the patient but he refused. On 07/06/2021 the patient was subsequently discharged ?from the hospital [place] [facility] and apparently they were trying to get in touch according to the notes with his aunt who is the legal ?guardian but were not able to. That was around 12:37 AM. Nonetheless in the interim since they have been basically at home using Santyl which ?was the recommendation from the hospital. This has been on all locations. Dry dressings have been applied following. Unfortunately this just does ?not seem to be doing quite enough especially for the sacral area which is a significant wound to be honest. We do not have any sign of an x-ray ?that was done of the sacral area nor that they know of anything in particular that was done at the hospital. With all that being said I think an ?x-ray would be a good idea based on what I am seeing currently this does appear to be quite significant from a wound care perspective. ?3/14; patient presents for follow-up. He has no issues or complaints today. He has been using Dakin's wet-to-dry dressings to the wound beds. He ?denies signs of infection. He reports Currently taking Augmentin. ?08/08/2021 upon evaluation today patient actually appears to be doing better in regard to his wounds in  the sacral region as well as the knee both ?of which are showing signs of excellent improvement. In regard to his foot I am not certain exactly what his blood flow is at this point. That ?something but I am going to spend a little bit of time looking into the chart regarding. Fortunately I do not see any signs of infection but I do ?believe he is probably can need some sharp debridement here but I do not want to perform this until we can confirm good blood flow. I know we ?were able to do this first visit because he was in the bed in the room where we can get him in position. We also were not able to do at last visit as ?we did not end up actually seeing him due to some incontinence issues. Nonetheless I think that we need to plan for ABIs at the next visit. ?Electronic Signature(s) ?Signed: 08/08/2021 10:52:09 AM By: Worthy Keeler PA-C ?Entered By: Worthy Keeler on 08/08/2021 10:52:09 ?Jonathan Mcmillan, Jonathan Mcmillan. ([id number]) ?-------------------------------------------------------------------------------- ?Physical Exam Details ?Patient Name: Jonathan Mcmillan, Jonathan Mcmillan. ?Date of Service: 08/08/2021 10:15 AM ?Medical Record Number: [id number] ?Patient Account Number: [id number] ?Date of Birth/Sex: [birth year] (26 y.o. M) ?Treating RN: Carlene Coria ?Primary Care Provider: Denton Lank Other Clinician: ?Referring Provider: Denton Lank ?Treating Provider/Extender: Jeri Cos ?Weeks in Treatment: 3 ?Constitutional ?Well-nourished and well-hydrated in no acute distress. ?Respiratory ?normal breathing without difficulty. ?Psychiatric ?this patient is able to make decisions and demonstrates good insight into disease process. Alert and Oriented x 3. pleasant and cooperative. ?Notes ?Patient seems to be doing well in regards to the wound to the sacral region which is great news. He is also doing well with the knee which is ?almost completely healed. With that being said I do believe that with regard to the wound in the region of his lateral  left foot this is going to need ?to be an area that is debrided but we need good arterial studies or at least screening prior to that. We do not have that here in the clinic. We can ?do that at the next visit.

## 2021-08-08 NOTE — Progress Notes (Signed)
Jonathan Mcmillan ([id number]) ?Visit Report for 08/08/2021 ?Arrival Information Details ?Patient Name: Jonathan Mcmillan, Jonathan Mcmillan. ?Date of Service: 08/08/2021 10:15 AM ?Medical Record Number: [id number] ?Patient Account Number: [id number] ?Date of Birth/Sex: 06-12-1995 (26 y.o. M) ?Treating RN: Carlene Coria ?Primary Care Julious Langlois: Denton Lank Other Clinician: ?Referring Andreas Sobolewski: Denton Lank ?Treating Gimena Buick/Extender: Jeri Cos ?Weeks in Treatment: 3 ?Visit Information History Since Last Visit ?All ordered tests and consults were completed: No ?Patient Arrived: Kasandra Knudsen ?Added or deleted any medications: No ?Arrival Time: 10:27 ?Any new allergies or adverse reactions: No ?Accompanied By: self ?Had a fall or experienced change in No ?Transfer Assistance: None ?activities of daily living that may affect ?Patient Identification Verified: Yes ?risk of falls: ?Secondary Verification Process Completed: Yes ?Signs or symptoms of abuse/neglect since last visito No ?Patient Has Alerts: Yes ?Hospitalized since last visit: No ?Patient Alerts: Type 2 diabetic ?Implantable device outside of the clinic excluding No ?NON COMPRESSABLE ?cellular tissue based products placed in the center ?since last visit: ?Has Dressing in Place as Prescribed: Yes ?Pain Present Now: No ?Electronic Signature(s) ?Signed: 08/08/2021 4:01:37 PM By: Carlene Coria RN ?Entered ByCarlene Coria on 08/08/2021 10:32:39 ?NORIS, KULINSKI. ([id number]) ?-------------------------------------------------------------------------------- ?Clinic Level of Care Assessment Details ?Patient Name: Jonathan Mcmillan. ?Date of Service: 08/08/2021 10:15 AM ?Medical Record Number: [id number] ?Patient Account Number: [id number] ?Date of Birth/Sex: 1996/02/06 (26 y.o. M) ?Treating RN: Carlene Coria ?Primary Care Jaedyn Marrufo: Denton Lank Other Clinician: ?Referring Katelyne Galster: Denton Lank ?Treating Saphronia Ozdemir/Extender: Jeri Cos ?Weeks in Treatment: 3 ?Clinic Level of Care Assessment Items ?TOOL 1  Quantity Score ?[]  - Use when EandM and Procedure is performed on INITIAL visit 0 ?ASSESSMENTS - Nursing Assessment / Reassessment ?[]  - General Physical Exam (combine w/ comprehensive assessment (listed just below) when performed on new ?0 ?pt. evals) ?[]  - 0 ?Comprehensive Assessment (HX, ROS, Risk Assessments, Wounds Hx, etc.) ?ASSESSMENTS - Wound and Skin Assessment / Reassessment ?[]  - Dermatologic / Skin Assessment (not related to wound area) 0 ?ASSESSMENTS - Ostomy and/or Continence Assessment and Care ?[]  - Incontinence Assessment and Management 0 ?[]  - 0 ?Ostomy Care Assessment and Management (repouching, etc.) ?PROCESS - Coordination of Care ?[]  - Simple Patient / Family Education for ongoing care 0 ?[]  - 0 ?Complex (extensive) Patient / Family Education for ongoing care ?[]  - 0 ?Staff obtains Consents, Records, Test Results / Process Orders ?[]  - 0 ?Staff telephones HHA, Nursing Homes / Clarify orders / etc ?[]  - 0 ?Routine Transfer to another Facility (non-emergent condition) ?[]  - 0 ?Routine Hospital Admission (non-emergent condition) ?[]  - 0 ?New Admissions / Biomedical engineer / Ordering NPWT, Apligraf, etc. ?[]  - 0 ?Emergency Hospital Admission (emergent condition) ?PROCESS - Special Needs ?[]  - Pediatric / Minor Patient Management 0 ?[]  - 0 ?Isolation Patient Management ?[]  - 0 ?Hearing / Language / Visual special needs ?[]  - 0 ?Assessment of Community assistance (transportation, D/C planning, etc.) ?[]  - 0 ?Additional assistance / Altered mentation ?[]  - 0 ?Support Surface(s) Assessment (bed, cushion, seat, etc.) ?INTERVENTIONS - Miscellaneous ?[]  - External ear exam 0 ?[]  - 0 ?Patient Transfer (multiple staff / Civil Service fast streamer / Similar devices) ?[]  - 0 ?Simple Staple / Suture removal (25 or less) ?[]  - 0 ?Complex Staple / Suture removal (26 or more) ?[]  - 0 ?Hypo/Hyperglycemic Management (do not check if billed separately) ?[]  - 0 ?Ankle / Brachial Index (ABI) - do not check if billed  separately ?Has the patient been seen at the hospital within the last three years: Yes ?Total Score:  0 ?Level Of Care: ____ ?DESTEN, MANOR ([id number]) ?Electronic Signature(s) ?Signed: 08/08/2021 4:01:37 PM By: Carlene Coria RN ?Entered By: Carlene Coria on 08/08/2021 10:54:26 ?JESAIAH, FABIANO. ([id number]) ?-------------------------------------------------------------------------------- ?Encounter Discharge Information Details ?Patient Name: Jonathan Mcmillan. ?Date of Service: 08/08/2021 10:15 AM ?Medical Record Number: [id number] ?Patient Account Number: [id number] ?Date of Birth/Sex: [birth year] (26 y.o. M) ?Treating RN: Carlene Coria ?Primary Care Henrik Orihuela: Denton Lank Other Clinician: ?Referring Bonny Vanleeuwen: Denton Lank ?Treating Cheng Dec/Extender: Jeri Cos ?Weeks in Treatment: 3 ?Encounter Discharge Information Items Post Procedure Vitals ?Discharge Condition: Stable ?Temperature (?F): 98.2 ?Ambulatory Status: Ambulatory ?Pulse (bpm): 106 ?Discharge Destination: Home ?Respiratory Rate (breaths/min): 18 ?Transportation: Private Auto ?Blood Pressure (mmHg): 173/98 ?Accompanied By: self ?Schedule Follow-up Appointment: Yes ?Clinical Summary of Care: Patient Declined ?Electronic Signature(s) ?Signed: 08/08/2021 10:56:27 AM By: Carlene Coria RN ?Entered ByCarlene Coria on 08/08/2021 10:56:27 ?MACON, SANDIFORD. ([id number]) ?-------------------------------------------------------------------------------- ?Lower Extremity Assessment Details ?Patient Name: Jonathan Mcmillan. ?Date of Service: 08/08/2021 10:15 AM ?Medical Record Number: [id number] ?Patient Account Number: [id number] ?Date of Birth/Sex: 04/03/1996 (26 y.o. M) ?Treating RN: Carlene Coria ?Primary Care Shyna Duignan: Denton Lank Other Clinician: ?Referring Katanya Schlie: Denton Lank ?Treating Shaylah Mcghie/Extender: Jeri Cos ?Weeks in Treatment: 3 ?Edema Assessment ?Assessed: [Left: No] [Right: No] ?Edema: [Left: Ye] [Right: s] ?Calf ?Left: Right: ?Point of Measurement: 35 cm  From Medial Instep 32 cm ?Ankle ?Left: Right: ?Point of Measurement: 10 cm From Medial Instep 21 cm ?Vascular Assessment ?Pulses: ?Dorsalis Pedis ?Palpable: [Left:Yes] ?Electronic Signature(s) ?Signed: 08/08/2021 4:01:37 PM By: Carlene Coria RN ?Entered By: Carlene Coria on 08/08/2021 10:39:01 ?TYRICE, HEWITT. ([id number]) ?-------------------------------------------------------------------------------- ?Multi Wound Chart Details ?Patient Name: TRAMOND, SLINKER. ?Date of Service: 08/08/2021 10:15 AM ?Medical Record Number: [id number] ?Patient Account Number: [id number] ?Date of Birth/Sex: 01-19-1996 (26 y.o. M) ?Treating RN: Carlene Coria ?Primary Care Joslyne Marshburn: Denton Lank Other Clinician: ?Referring Sheccid Lahmann: Denton Lank ?Treating Jayson Waterhouse/Extender: Jeri Cos ?Weeks in Treatment: 3 ?Vital Signs ?Height(in): 68 ?Pulse(bpm): 106 ?Weight(lbs): 146 ?Blood Pressure(mmHg): 173/138 ?Body Mass Index(BMI): 22.2 ?Temperature(??F): 98.2 ?Respiratory Rate(breaths/min): 18 ?Photos: ?Wound Location: Sacrum Left, Lateral Foot Right Knee ?Wounding Event: Gradually Appeared Gradually Appeared Gradually Appeared ?Primary Etiology: Pressure Ulcer Pressure Ulcer Pressure Ulcer ?Comorbid History: Hypertension, Type I Diabetes, End Hypertension, Type I Diabetes, End Hypertension, Type I Diabetes, End ?Stage Renal Disease, Neuropathy Stage Renal Disease, Neuropathy Stage Renal Disease, Neuropathy ?Date Acquired: 05/31/2021 05/31/2021 05/31/2021 ?Weeks of Treatment: 3 3 3  ?Wound Status: Open Open Open ?Wound Recurrence: No No No ?Measurements L x W x D (cm) 7.5x3x3.6 3x5x0.5 0.2x0.2x0.1 ?Area (cm?) : 17.671 11.781 0.031 ?Volume (cm?) : 63.617 5.89 0.003 ?% Reduction in Area: 40.50% -50.00% 98.60% ?% Reduction in Volume: 14.30% -274.90% 99.30% ?Classification: Category/Stage IV Unstageable/Unclassified Unstageable/Unclassified ?Exudate Amount: Medium Medium None Present ?Exudate Type: Serosanguineous Serosanguineous N/A ?Exudate Color: red,  brown red, brown N/A ?Foul Odor After Cleansing: No Yes No ?Odor Anticipated Due to Product N/A No N/A ?Use: ?Granulation Amount: Large (67-100%) None Present (0%) Large (67-100%) ?Granulation Quality: Pink N/A N/A

## 2021-08-17 ENCOUNTER — Encounter: Payer: Medicare Other | Attending: Physician Assistant | Admitting: Physician Assistant

## 2021-08-17 DIAGNOSIS — G931 Anoxic brain damage, not elsewhere classified: Secondary | ICD-10-CM | POA: Diagnosis not present

## 2021-08-17 DIAGNOSIS — L89894 Pressure ulcer of other site, stage 4: Secondary | ICD-10-CM | POA: Diagnosis not present

## 2021-08-17 DIAGNOSIS — L89154 Pressure ulcer of sacral region, stage 4: Secondary | ICD-10-CM | POA: Diagnosis present

## 2021-08-17 DIAGNOSIS — I2510 Atherosclerotic heart disease of native coronary artery without angina pectoris: Secondary | ICD-10-CM | POA: Insufficient documentation

## 2021-08-17 NOTE — Progress Notes (Addendum)
HONG, MORING ([id number]) ?Visit Report for 08/17/2021 ?Chief Complaint Document Details ?Patient Name: ARMARION, GREEK. ?Date of Service: 08/17/2021 12:30 PM ?Medical Record Number: [id number] ?Patient Account Number: [id number] ?Date of Birth/Sex: 1996/04/15 (26 y.o. M) ?Treating RN: Carlene Coria ?Primary Care Provider: Denton Lank Other Clinician: ?Referring Provider: Denton Lank ?Treating Provider/Extender: Jeri Cos ?Weeks in Treatment: 4 ?Information Obtained from: Patient ?Chief Complaint ?Sacral, left lateral foot, and right knee pressure ulcers ?Electronic Signature(s) ?Signed: 08/17/2021 12:51:16 PM By: Worthy Keeler PA-C ?Entered By: Worthy Keeler on 08/17/2021 12:51:15 ?TAHMIR, KLECKNER. ([id number]) ?-------------------------------------------------------------------------------- ?Debridement Details ?Patient Name: ODEAN, FESTER. ?Date of Service: 08/17/2021 12:30 PM ?Medical Record Number: [id number] ?Patient Account Number: [id number] ?Date of Birth/Sex: 04-12-1996 (26 y.o. M) ?Treating RN: Carlene Coria ?Primary Care Provider: Denton Lank Other Clinician: ?Referring Provider: Denton Lank ?Treating Provider/Extender: Jeri Cos ?Weeks in Treatment: 4 ?Debridement Performed for ?Wound #2 Left,Lateral Foot ?Assessment: ?Performed By: Physician Tommie Sams., PA-C ?Debridement Type: Debridement ?Level of Consciousness (Pre- ?Awake and Alert ?procedure): ?Pre-procedure Verification/Time Out ?Yes - 12:54 ?Taken: ?Start Time: 12:54 ?Total Area Debrided (L x W): 3 (cm) x 5 (cm) = 15 (cm?) ?Tissue and other material ?Viable, Non-Viable, Eschar, Subcutaneous, Tendon ?debrided: ?Level: Skin/Subcutaneous Tissue/Muscle ?Debridement Description: Excisional ?Instrument: Forceps, Scissors ?Bleeding: Minimum ?Hemostasis Achieved: Pressure ?End Time: 12:58 ?Procedural Pain: 0 ?Post Procedural Pain: 0 ?Response to Treatment: Procedure was tolerated well ?Level of Consciousness (Post- ?Awake and  Alert ?procedure): ?Post Debridement Measurements of Total Wound ?Length: (cm) 3.2 ?Stage: Unstageable/Unclassified ?Width: (cm) 5.1 ?Depth: (cm) 1 ?Volume: (cm?) 12.818 ?Character of Wound/Ulcer Post Debridement: Improved ?Post Procedure Diagnosis ?Same as Pre-procedure ?Electronic Signature(s) ?Signed: 08/17/2021 1:18:59 PM By: Carlene Coria RN ?Signed: 08/18/2021 5:15:24 PM By: Worthy Keeler PA-C ?Entered By: Carlene Coria on 08/17/2021 12:56:30 ?FELDER, LEBEDA. ([id number]) ?-------------------------------------------------------------------------------- ?Debridement Details ?Patient Name: BARNABY, RIPPEON. ?Date of Service: 08/17/2021 12:30 PM ?Medical Record Number: [id number] ?Patient Account Number: [id number] ?Date of Birth/Sex: [birth year] (26 y.o. M) ?Treating RN: Carlene Coria ?Primary Care Provider: Denton Lank Other Clinician: ?Referring Provider: Denton Lank ?Treating Provider/Extender: Jeri Cos ?Weeks in Treatment: 4 ?Debridement Performed for ?Wound #1 Sacrum ?Assessment: ?Performed By: Physician Tommie Sams., PA-C ?Debridement Type: Debridement ?Level of Consciousness (Pre- ?Awake and Alert ?procedure): ?Pre-procedure Verification/Time Out ?Yes - 12:54 ?Taken: ?Start Time: 12:54 ?Total Area Debrided (L x W): 1 (cm) x 1 (cm) = 1 (cm?) ?Tissue and other material ?Viable, Non-Viable, Eschar, Subcutaneous, Tendon ?debrided: ?Level: Skin/Subcutaneous Tissue/Muscle ?Debridement Description: Excisional ?Instrument: Forceps, Scissors ?Bleeding: Minimum ?Hemostasis Achieved: Pressure ?End Time: 12:58 ?Procedural Pain: 0 ?Post Procedural Pain: 0 ?Response to Treatment: Procedure was tolerated well ?Level of Consciousness (Post- ?Awake and Alert ?procedure): ?Post Debridement Measurements of Total Wound ?Length: (cm) 7 ?Stage: Category/Stage IV ?Width: (cm) 2.5 ?Depth: (cm) 2.7 ?Volume: (cm?) 37.11 ?Character of Wound/Ulcer Post Debridement: Improved ?Post Procedure Diagnosis ?Same as Pre-procedure ?Electronic  Signature(s) ?Signed: 08/17/2021 1:18:59 PM By: Carlene Coria RN ?Signed: 08/18/2021 5:15:24 PM By: Worthy Keeler PA-C ?Entered By: Carlene Coria on 08/17/2021 13:02:15 ?LOFTON, LEON. ([id number]) ?-------------------------------------------------------------------------------- ?HPI Details ?Patient Name: ZYSHAWN, BOHNENKAMP. ?Date of Service: 08/17/2021 12:30 PM ?Medical Record Number: [id number] ?Patient Account Number: [id number] ?Date of Birth/Sex: 17-Jul-1995 (26 y.o. M) ?Treating RN: Carlene Coria ?Primary Care Provider: Denton Lank Other Clinician: ?Referring Provider: Denton Lank ?Treating Provider/Extender: Jeri Cos ?Weeks in Treatment: 4 ?History of Present Illness ?HPI Description: 07/14/2021 patient presents today for initial inspection here in the clinic concerning an issue that  he has been having with ?wounds over the left lateral foot, right knee, and the largest of these is the sacral region where there is significant necrotic tissue noted in the ?base of this wound. Subsequently the patient is referred to [place] from what appears to be primary care in order for further management of his ?wounds. He also sees physical medicine and rehabilitation. I did receive a lot of the notes from there as far as what is been going on with regard ?to this gentleman. ?Patient is a 26 year old male who had an anoxic encephalopathy, end-stage renal disease on hemodialysis, he has been disabled with generalized ?weakness and subsequently developed a sacral decubitus ulcer which apparently was present before he went into the hospital according to what is ?not tells me but worsened during the time in the ICU simply because he was unable to be turned with all the equipment that he was hooked up to. ?Again it sounds like they did what they could to try to keep this under control but nonetheless due to the severity of his illness it sounds as if he is ?lucky to be alive to be perfectly honest. Subsequently the patient also does have  diabetes mellitus, hypertension, nicotine dependence, he did go ?to the emergency department on 07/05/2021 due to a "abscess" in the gluteal region. The family member had seen this and wanted him to be ?checked out. During that time medical staff wanted to admit the patient but he refused. On 07/06/2021 the patient was subsequently discharged ?from the hospital [place] and apparently they were trying to get in touch according to the notes with his aunt who is the legal ?guardian but were not able to. That was around 12:37 AM. Nonetheless in the interim since they have been basically at home using Santyl which ?was the recommendation from the hospital. This has been on all locations. Dry dressings have been applied following. Unfortunately this just does ?not seem to be doing quite enough especially for the sacral area which is a significant wound to be honest. We do not have any sign of an x-ray ?that was done of the sacral area nor that they know of anything in particular that was done at the hospital. With all that being said I think an ?x-ray would be a good idea based on what I am seeing currently this does appear to be quite significant from a wound care perspective. ?3/14; patient presents for follow-up. He has no issues or complaints today. He has been using [employer]'s wet-to-dry dressings to the wound beds. He ?denies signs of infection. He reports Currently taking Augmentin. ?08/08/2021 upon evaluation today patient actually appears to be doing better in regard to his wounds in the sacral region as well as the knee both ?of which are showing signs of excellent improvement. In regard to his foot I am not certain exactly what his blood flow is at this point. That ?something but I am going to spend a little bit of time looking into the chart regarding. Fortunately I do not see any signs of infection but I do ?believe he is probably can need some sharp debridement here but I do not want to perform this  until we can confirm good blood flow. I know we ?were able to do this first visit because he was in the bed in the room where we can get him in position. We also were not able to do at last visit as ?we did not end up act

## 2021-08-17 NOTE — Progress Notes (Signed)
ALEKSA, CATTERTON ([id number]) ?Visit Report for 08/17/2021 ?Arrival Information Details ?Patient Name: Jonathan Mcmillan, Jonathan Mcmillan. ?Date of Service: 08/17/2021 12:30 PM ?Medical Record Number: [id number] ?Patient Account Number: [id number] ?Date of Birth/Sex: 1995/08/21 (26 y.o. M) ?Treating RN: Carlene Coria ?Primary Care Daveda Larock: Denton Lank Other Clinician: ?Referring Bitania Shankland: Denton Lank ?Treating Johnluke Haugen/Extender: Jeri Cos ?Weeks in Treatment: 4 ?Visit Information History Since Last Visit ?Pain Present Now: No ?Patient Arrived: Kasandra Knudsen ?Arrival Time: 12:38 ?Accompanied By: self ?Transfer Assistance: None ?Patient Identification Verified: Yes ?Secondary Verification Process Completed: Yes ?Patient Requires Transmission-Based No ?Precautions: ?Patient Has Alerts: Yes ?Patient Alerts: Type 2 diabetic ?NON ?COMPRESSABLE ?Electronic Signature(s) ?Signed: 08/17/2021 1:18:59 PM By: Carlene Coria RN ?Entered By: Carlene Coria on 08/17/2021 12:42:18 ?Jonathan Mcmillan, Jonathan Mcmillan. ([id number]) ?-------------------------------------------------------------------------------- ?Clinic Level of Care Assessment Details ?Patient Name: Jonathan Mcmillan, Jonathan Mcmillan. ?Date of Service: 08/17/2021 12:30 PM ?Medical Record Number: [id number] ?Patient Account Number: [id number] ?Date of Birth/Sex: October 31, 1995 (26 y.o. M) ?Treating RN: Carlene Coria ?Primary Care Hendry Speas: Denton Lank Other Clinician: ?Referring Jerriah Ines: Denton Lank ?Treating Farid Grigorian/Extender: Jeri Cos ?Weeks in Treatment: 4 ?Clinic Level of Care Assessment Items ?TOOL 1 Quantity Score ?[]  - Use when EandM and Procedure is performed on INITIAL visit 0 ?ASSESSMENTS - Nursing Assessment / Reassessment ?[]  - General Physical Exam (combine w/ comprehensive assessment (listed just below) when performed on new ?0 ?pt. evals) ?[]  - 0 ?Comprehensive Assessment (HX, ROS, Risk Assessments, Wounds Hx, etc.) ?ASSESSMENTS - Wound and Skin Assessment / Reassessment ?[]  - Dermatologic / Skin Assessment (not related to wound  area) 0 ?ASSESSMENTS - Ostomy and/or Continence Assessment and Care ?[]  - Incontinence Assessment and Management 0 ?[]  - 0 ?Ostomy Care Assessment and Management (repouching, etc.) ?PROCESS - Coordination of Care ?[]  - Simple Patient / Family Education for ongoing care 0 ?[]  - 0 ?Complex (extensive) Patient / Family Education for ongoing care ?[]  - 0 ?Staff obtains Consents, Records, Test Results / Process Orders ?[]  - 0 ?Staff telephones HHA, Nursing Homes / Clarify orders / etc ?[]  - 0 ?Routine Transfer to another Facility (non-emergent condition) ?[]  - 0 ?Routine Hospital Admission (non-emergent condition) ?[]  - 0 ?New Admissions / Biomedical engineer / Ordering NPWT, Apligraf, etc. ?[]  - 0 ?Emergency Hospital Admission (emergent condition) ?PROCESS - Special Needs ?[]  - Pediatric / Minor Patient Management 0 ?[]  - 0 ?Isolation Patient Management ?[]  - 0 ?Hearing / Language / Visual special needs ?[]  - 0 ?Assessment of Community assistance (transportation, D/C planning, etc.) ?[]  - 0 ?Additional assistance / Altered mentation ?[]  - 0 ?Support Surface(s) Assessment (bed, cushion, seat, etc.) ?INTERVENTIONS - Miscellaneous ?[]  - External ear exam 0 ?[]  - 0 ?Patient Transfer (multiple staff / Civil Service fast streamer / Similar devices) ?[]  - 0 ?Simple Staple / Suture removal (25 or less) ?[]  - 0 ?Complex Staple / Suture removal (26 or more) ?[]  - 0 ?Hypo/Hyperglycemic Management (do not check if billed separately) ?[]  - 0 ?Ankle / Brachial Index (ABI) - do not check if billed separately ?Has the patient been seen at the hospital within the last three years: Yes ?Total Score: 0 ?Level Of Care: ____ ?Jonathan Mcmillan, Jonathan Mcmillan ([id number]) ?Electronic Signature(s) ?Signed: 08/17/2021 1:18:59 PM By: Carlene Coria RN ?Entered By: Carlene Coria on 08/17/2021 13:12:45 ?Jonathan Mcmillan, Jonathan Mcmillan. ([id number]) ?-------------------------------------------------------------------------------- ?Encounter Discharge Information Details ?Patient Name: Jonathan Mcmillan, Jonathan Mcmillan. ?Date of Service: 08/17/2021 12:30 PM ?Medical Record Number: [id number] ?Patient Account Number: [id number] ?Date of Birth/Sex: 1995-09-05 (26 y.o. M) ?Treating RN: Carlene Coria ?Primary Care Jaydon Avina: Denton Lank Other Clinician: ?Referring Kadie Balestrieri:  Denton Lank ?Treating Tymier Lindholm/Extender: Jeri Cos ?Weeks in Treatment: 4 ?Encounter Discharge Information Items Post Procedure Vitals ?Discharge Condition: Stable ?Temperature (?F): 98 ?Ambulatory Status: Kasandra Knudsen ?Pulse (bpm): 93 ?Discharge Destination: Home ?Respiratory Rate (breaths/min): 20 ?Transportation: Private Auto ?Blood Pressure (mmHg): 176/108 ?Accompanied By: self ?Schedule Follow-up Appointment: Yes ?Clinical Summary of Care: Patient Declined ?Electronic Signature(s) ?Signed: 08/17/2021 1:18:59 PM By: Carlene Coria RN ?Entered By: Carlene Coria on 08/17/2021 13:14:08 ?Jonathan Mcmillan, Jonathan Mcmillan. ([id number]) ?-------------------------------------------------------------------------------- ?Lower Extremity Assessment Details ?Patient Name: Jonathan Mcmillan, Jonathan Mcmillan. ?Date of Service: 08/17/2021 12:30 PM ?Medical Record Number: [id number] ?Patient Account Number: [id number] ?Date of Birth/Sex: [birth year] (26 y.o. M) ?Treating RN: Carlene Coria ?Primary Care Araiya Tilmon: Denton Lank Other Clinician: ?Referring Chalmer Zheng: Denton Lank ?Treating Jeany Seville/Extender: Jeri Cos ?Weeks in Treatment: 4 ?Edema Assessment ?Assessed: [Left: No] [Right: No] ?Edema: [Left: Ye] [Right: s] ?Calf ?Left: Right: ?Point of Measurement: 35 cm From Medial Instep 32 cm ?Ankle ?Left: Right: ?Point of Measurement: 10 cm From Medial Instep 21 cm ?Vascular Assessment ?Pulses: ?Dorsalis Pedis ?Palpable: [Left:Yes] ?Electronic Signature(s) ?Signed: 08/17/2021 1:18:59 PM By: Carlene Coria RN ?Entered By: Carlene Coria on 08/17/2021 12:50:27 ?Jonathan Mcmillan, Jonathan Mcmillan. ([id number]) ?-------------------------------------------------------------------------------- ?Multi Wound Chart Details ?Patient Name: Jonathan Mcmillan, Jonathan Mcmillan. ?Date of Service: 08/17/2021 12:30 PM ?Medical Record Number: [id number] ?Patient Account Number: [id number] ?Date of Birth/Sex: August 09, 1995 (26 y.o. M) ?Treating RN: Carlene Coria ?Primary Care Mehlani Blankenburg: Denton Lank Other Clinician: ?Referring Shirlean Berman: Denton Lank ?Treating Lanora Reveron/Extender: Jeri Cos ?Weeks in Treatment: 4 ?Vital Signs ?Height(in): 68 ?Pulse(bpm): 93 ?Weight(lbs): 146 ?Blood Pressure(mmHg): 176/108 ?Body Mass Index(BMI): 22.2 ?Temperature(??F): 98 ?Respiratory Rate(breaths/min): 20 ?Photos: ?Wound Location: Sacrum Left, Lateral Foot Right Knee ?Wounding Event: Gradually Appeared Gradually Appeared Gradually Appeared ?Primary Etiology: Pressure Ulcer Pressure Ulcer Pressure Ulcer ?Comorbid History: Hypertension, Type I Diabetes, End Hypertension, Type I Diabetes, End Hypertension, Type I Diabetes, End ?Stage Renal Disease, Neuropathy Stage Renal Disease, Neuropathy Stage Renal Disease, Neuropathy ?Date Acquired: 05/31/2021 05/31/2021 05/31/2021 ?Weeks of Treatment: 4 4 4  ?Wound Status: Open Open Open ?Wound Recurrence: No No No ?Measurements L x W x D (cm) 7x2.5x2.7 3.2x5.1x1 0x0x0 ?Area (cm?) : 13.744 12.818 0 ?Volume (cm?) : 37.11 12.818 0 ?% Reduction in Area: 53.70% -63.20% 100.00% ?% Reduction in Volume: 50.00% -715.90% 100.00% ?Classification: Category/Stage IV Unstageable/Unclassified Unstageable/Unclassified ?Exudate Amount: Medium Medium None Present ?Exudate Type: Serosanguineous Serosanguineous N/A ?Exudate Color: red, brown red, brown N/A ?Foul Odor After Cleansing: No Yes No ?Odor Anticipated Due to Product N/A No N/A ?Use: ?Granulation Amount: Large (67-100%) None Present (0%) None Present (0%) ?Granulation Quality: Pink N/A N/A ?Necrotic Amount: Small (1-33%) Large (67-100%) None Present (0%) ?Necrotic Tissue: Eschar, Adherent [place] N/A ?Exposed Structures: ?Fat Layer (Subcutaneous Tissue): ?Fascia: No ?Fascia: No ?Yes Fat Layer (Subcutaneous Tissue): Fat  Layer (Subcutaneous Tissue): ?Muscle: Yes No No ?Bone: Yes ?Tendon: No ?Tendon: No ?Fascia: No ?Muscle: No ?Muscle: No ?Tendon: No ?Joint: No ?Joint: No ?Joint: No ?Bone: No ?Bone: No ?Epithelialization: None No

## 2021-08-18 ENCOUNTER — Ambulatory Visit
Admission: RE | Admit: 2021-08-18 | Discharge: 2021-08-18 | Disposition: A | Discharge status: Home / Self Care | Payer: Medicare Other | Source: Ambulatory Visit | Attending: Physician Assistant | Admitting: Physician Assistant

## 2021-08-18 ENCOUNTER — Other Ambulatory Visit: Payer: Self-pay | Admitting: Physician Assistant

## 2021-08-18 ENCOUNTER — Other Ambulatory Visit (INDEPENDENT_AMBULATORY_CARE_PROVIDER_SITE_OTHER): Payer: Self-pay

## 2021-08-18 ENCOUNTER — Encounter (INDEPENDENT_AMBULATORY_CARE_PROVIDER_SITE_OTHER): Payer: Self-pay

## 2021-08-18 ENCOUNTER — Encounter (INDEPENDENT_AMBULATORY_CARE_PROVIDER_SITE_OTHER): Payer: Self-pay | Admitting: Vascular Surgery

## 2021-08-18 DIAGNOSIS — R609 Edema, unspecified: Secondary | ICD-10-CM | POA: Insufficient documentation

## 2021-08-22 ENCOUNTER — Other Ambulatory Visit (INDEPENDENT_AMBULATORY_CARE_PROVIDER_SITE_OTHER): Payer: Self-pay | Admitting: Nurse Practitioner

## 2021-08-22 DIAGNOSIS — N186 End stage renal disease: Secondary | ICD-10-CM

## 2021-08-24 ENCOUNTER — Encounter (INDEPENDENT_AMBULATORY_CARE_PROVIDER_SITE_OTHER): Payer: Self-pay | Admitting: Nurse Practitioner

## 2021-08-24 ENCOUNTER — Other Ambulatory Visit
Admission: RE | Admit: 2021-08-24 | Discharge: 2021-08-24 | Disposition: A | Discharge status: Home / Self Care | Payer: Medicare Other | Source: Ambulatory Visit | Attending: Physician Assistant | Admitting: Physician Assistant

## 2021-08-24 ENCOUNTER — Other Ambulatory Visit (INDEPENDENT_AMBULATORY_CARE_PROVIDER_SITE_OTHER): Payer: Self-pay

## 2021-08-24 ENCOUNTER — Encounter (INDEPENDENT_AMBULATORY_CARE_PROVIDER_SITE_OTHER): Payer: Self-pay

## 2021-08-24 ENCOUNTER — Encounter: Payer: Medicare Other | Admitting: Physician Assistant

## 2021-08-24 DIAGNOSIS — L97529 Non-pressure chronic ulcer of other part of left foot with unspecified severity: Secondary | ICD-10-CM | POA: Diagnosis present

## 2021-08-24 DIAGNOSIS — L089 Local infection of the skin and subcutaneous tissue, unspecified: Secondary | ICD-10-CM | POA: Insufficient documentation

## 2021-08-24 DIAGNOSIS — L89154 Pressure ulcer of sacral region, stage 4: Secondary | ICD-10-CM | POA: Diagnosis not present

## 2021-08-24 NOTE — Progress Notes (Addendum)
EHREN, BERISHA ([id number]) ?Visit Report for 08/24/2021 ?Chief Complaint Document Details ?Patient Name: Jonathan, Mcmillan. ?Date of Service: 08/24/2021 12:30 PM ?Medical Record Number: [id number] ?Patient Account Number: [id number] ?Date of Birth/Sex: [birth year] (26 y.o. M) ?Treating RN: Carlene Coria ?Primary Care Provider: Denton Lank Other Clinician: ?Referring Provider: Denton Lank ?Treating Provider/Extender: Jeri Cos ?Weeks in Treatment: 5 ?Information Obtained from: Patient ?Chief Complaint ?Sacral, left lateral foot, and right knee pressure ulcers ?Electronic Signature(s) ?Signed: 08/24/2021 12:53:28 PM By: Worthy Keeler PA-C ?Entered By: Worthy Keeler on 08/24/2021 12:53:27 ?Jonathan, Mcmillan. ([id number]) ?-------------------------------------------------------------------------------- ?Debridement Details ?Patient Name: Jonathan, Mcmillan. ?Date of Service: 08/24/2021 12:30 PM ?Medical Record Number: [id number] ?Patient Account Number: [id number] ?Date of Birth/Sex: Oct 12, 1995 (26 y.o. M) ?Treating RN: Carlene Coria ?Primary Care Provider: Denton Lank Other Clinician: ?Referring Provider: Denton Lank ?Treating Provider/Extender: Jeri Cos ?Weeks in Treatment: 5 ?Debridement Performed for ?Wound #2 Left,Lateral Foot ?Assessment: ?Performed By: Physician Tommie Sams., PA-C ?Debridement Type: Debridement ?Level of Consciousness (Pre- ?Awake and Alert ?procedure): ?Pre-procedure Verification/Time Out ?Yes - 12:58 ?Taken: ?Start Time: 12:58 ?Total Area Debrided (L x W): 2 (cm) x 3 (cm) = 6 (cm?) ?Tissue and other material ?Viable, Non-Viable, Muscle, Slough, Subcutaneous, Skin: Dermis , Skin: Epidermis, Slough ?debrided: ?Level: Skin/Subcutaneous Tissue/Muscle ?Debridement Description: Excisional ?Instrument: Blade, Forceps ?Bleeding: Moderate ?Hemostasis Achieved: Pressure ?End Time: 13:05 ?Procedural Pain: 0 ?Post Procedural Pain: 0 ?Response to Treatment: Procedure was tolerated well ?Level of  Consciousness (Post- ?Awake and Alert ?procedure): ?Post Debridement Measurements of Total Wound ?Length: (cm) 2.7 ?Stage: Category/Stage IV ?Width: (cm) 5 ?Depth: (cm) 2.4 ?Volume: (cm?) 25.447 ?Character of Wound/Ulcer Post Debridement: Improved ?Post Procedure Diagnosis ?Same as Pre-procedure ?Electronic Signature(s) ?Signed: 08/24/2021 3:59:26 PM By: Worthy Keeler PA-C ?Signed: 08/29/2021 9:16:22 AM By: Carlene Coria RN ?Entered By: Carlene Coria on 08/24/2021 13:01:29 ?Jonathan, Mcmillan. ([id number]) ?-------------------------------------------------------------------------------- ?HPI Details ?Patient Name: Jonathan, Mcmillan. ?Date of Service: 08/24/2021 12:30 PM ?Medical Record Number: [id number] ?Patient Account Number: [id number] ?Date of Birth/Sex: 1995/09/07 (26 y.o. M) ?Treating RN: Carlene Coria ?Primary Care Provider: Denton Lank Other Clinician: ?Referring Provider: Denton Lank ?Treating Provider/Extender: Jeri Cos ?Weeks in Treatment: 5 ?History of Present Illness ?HPI Description: 07/14/2021 patient presents today for initial inspection here in the clinic concerning an issue that he has been having with ?wounds over the left lateral foot, right knee, and the largest of these is the sacral region where there is significant necrotic tissue noted in the ?base of this wound. Subsequently the patient is referred to [place] from what appears to be primary care in order for further management of his ?wounds. He also sees physical medicine and rehabilitation. I did receive a lot of the notes from there as far as what is been going on with regard ?to this gentleman. ?Patient is a 26 year old male who had an anoxic encephalopathy, end-stage renal disease on hemodialysis, he has been disabled with generalized ?weakness and subsequently developed a sacral decubitus ulcer which apparently was present before he went into the hospital according to what is ?not tells me but worsened during the time in the ICU simply because he  was unable to be turned with all the equipment that he was hooked up to. ?Again it sounds like they did what they could to try to keep this under control but nonetheless due to the severity of his illness it sounds as if he is ?lucky to be alive to be perfectly honest. Subsequently the patient also does have diabetes mellitus,  hypertension, nicotine dependence, he did go ?to the emergency department on 07/05/2021 due to a "abscess" in the gluteal region. The family member had seen this and wanted him to be ?checked out. During that time medical staff wanted to admit the patient but he refused. On 07/06/2021 the patient was subsequently discharged ?from the hospital Jonathan Mcmillan and apparently they were trying to get in touch according to the notes with his aunt who is the legal ?guardian but were not able to. That was around 12:37 AM. Nonetheless in the interim since they have been basically at home using Santyl which ?was the recommendation from the hospital. This has been on all locations. Dry dressings have been applied following. Unfortunately this just does ?not seem to be doing quite enough especially for the sacral area which is a significant wound to be honest. We do not have any sign of an x-ray ?that was done of the sacral area nor that they know of anything in particular that was done at the hospital. With all that being said I think an ?x-ray would be a good idea based on what I am seeing currently this does appear to be quite significant from a wound care perspective. ?3/14; patient presents for follow-up. He has no issues or complaints today. He has been using Dak[employer]'s wet-to-dry dressings to the wound beds. He ?denies signs of infection. He reports Currently taking Augmentin. ?08/08/2021 upon evaluation today patient actually appears to be doing better in regard to his wounds in the sacral region as well as the knee both ?of which are showing signs of excellent improvement. In regard to his foot  I am not certain exactly what his blood flow is at this point. That ?something but I am going to spend a little bit of time looking into the chart regarding. Fortunately I do not see any signs of infection but I do ?believe he is probably can need some sharp debridement here but I do not want to perform this until we can confirm good blood flow. I know we ?were able to do this first visit because he was in the bed in the room where we can get him in position. We also were not able to do at last visit as ?we did not end up actually seeing him due to some incontinence issues. Nonetheless I think that we need to plan for ABIs at the next visit. ?08-17-2021 upon evaluation today patient appears to be doing well with regard to his sacral wound as well as the knee the knee is completely ?healed. In regard to the foot he does have need for sharp debridement today. I am going to do that he is noncompressible but nonetheless has ?good pulses and good capillary refill there is no evidence of compromise here although he does have a lot of necrotic tissue we need to help clean ?away here. ?08-24-2021 upon evaluation today patient's sacral region is actually doing quite well. Unfortunately the left lateral foot area is not doing nearly as ?well there is a lot of necrotic tissue here including necrotic muscle which I would have to attempt to remove today. Overall I do feel like that the ?patient is showing some signs of improvement were still holding as far as his insurance is concerned and the MRI being scheduled apparently we ?do not have documentation of insurance that will cover for the MRI and therefore they are not scheduling it at this point. Nonetheless his aunt ?does need to figure this out  as soon as possible as we really do need to look into getting this MRI done ASAP with regard to the left foot. ?Electronic Signature(s) ?Signed: 08/24/2021 2:59:45 PM By: Worthy Keeler PA-C ?Entered By: Worthy Keeler on 08/24/2021  14:59:44 ?Jonathan, Mcmillan. ([id number]) ?-------------------------------------------------------------------------------- ?Physical Exam Details ?Patient Name: Jonathan, Mcmillan. ?Date of Service: 08/24/2021 12:30

## 2021-08-28 LAB — AEROBIC CULTURE W GRAM STAIN (SUPERFICIAL SPECIMEN)

## 2021-08-29 NOTE — Progress Notes (Signed)
Jonathan Mcmillan, Jonathan Mcmillan ([id number]) ?Visit Report for 08/24/2021 ?Arrival Information Details ?Patient Name: Jonathan Mcmillan, Jonathan Mcmillan. ?Date of Service: 08/24/2021 12:30 PM ?Medical Record Number: [id number] ?Patient Account Number: [id number] ?Date of Birth/Sex: December 13, 1995 (26 y.o. M) ?Treating RN: Carlene Coria ?Primary Care Aiyannah Fayad: Denton Lank Other Clinician: ?Referring Syrus Nakama: Denton Lank ?Treating Brenden Rudman/Extender: Jeri Cos ?Weeks in Treatment: 5 ?Visit Information History Since Last Visit ?All ordered tests and consults were completed: No ?Patient Arrived: Jonathan Mcmillan ?Added or deleted any medications: No ?Arrival Time: 12:36 ?Any new allergies or adverse reactions: No ?Accompanied By: self ?Had a fall or experienced change in No ?Transfer Assistance: None ?activities of daily living that may affect ?Patient Identification Verified: Yes ?risk of falls: ?Secondary Verification Process Completed: Yes ?Signs or symptoms of abuse/neglect since last visito No ?Patient Requires Transmission-Based No ?Hospitalized since last visit: No ?Precautions: ?Implantable device outside of the clinic excluding No ?Patient Has Alerts: Yes ?cellular tissue based products placed in the center ?Patient Alerts: Type 2 diabetic ?since last visit: ?NON ?Has Dressing in Place as Prescribed: Yes ?COMPRESSABLE ?Pain Present Now: No ?Electronic Signature(s) ?Signed: 08/29/2021 9:16:22 AM By: Carlene Coria RN ?Entered By: Carlene Coria on 08/24/2021 12:40:06 ?Jonathan Mcmillan, Jonathan Mcmillan. ([id number]) ?-------------------------------------------------------------------------------- ?Clinic Level of Care Assessment Details ?Patient Name: Jonathan Mcmillan, Jonathan Mcmillan. ?Date of Service: 08/24/2021 12:30 PM ?Medical Record Number: [id number] ?Patient Account Number: [id number] ?Date of Birth/Sex: 11-Oct-1995 (26 y.o. M) ?Treating RN: Carlene Coria ?Primary Care Jandiel Magallanes: Denton Lank Other Clinician: ?Referring Brion Hedges: Denton Lank ?Treating Ishana Blades/Extender: Jeri Cos ?Weeks in  Treatment: 5 ?Clinic Level of Care Assessment Items ?TOOL 1 Quantity Score ?[]  - Use when EandM and Procedure is performed on INITIAL visit 0 ?ASSESSMENTS - Nursing Assessment / Reassessment ?[]  - General Physical Exam (combine w/ comprehensive assessment (listed just below) when performed on new ?0 ?pt. evals) ?[]  - 0 ?Comprehensive Assessment (HX, ROS, Risk Assessments, Wounds Hx, etc.) ?ASSESSMENTS - Wound and Skin Assessment / Reassessment ?[]  - Dermatologic / Skin Assessment (not related to wound area) 0 ?ASSESSMENTS - Ostomy and/or Continence Assessment and Care ?[]  - Incontinence Assessment and Management 0 ?[]  - 0 ?Ostomy Care Assessment and Management (repouching, etc.) ?PROCESS - Coordination of Care ?[]  - Simple Patient / Family Education for ongoing care 0 ?[]  - 0 ?Complex (extensive) Patient / Family Education for ongoing care ?[]  - 0 ?Staff obtains Consents, Records, Test Results / Process Orders ?[]  - 0 ?Staff telephones HHA, Nursing Homes / Clarify orders / etc ?[]  - 0 ?Routine Transfer to another Facility (non-emergent condition) ?[]  - 0 ?Routine Hospital Admission (non-emergent condition) ?[]  - 0 ?New Admissions / Biomedical engineer / Ordering NPWT, Apligraf, etc. ?[]  - 0 ?Emergency Hospital Admission (emergent condition) ?PROCESS - Special Needs ?[]  - Pediatric / Minor Patient Management 0 ?[]  - 0 ?Isolation Patient Management ?[]  - 0 ?Hearing / Language / Visual special needs ?[]  - 0 ?Assessment of Community assistance (transportation, D/C planning, etc.) ?[]  - 0 ?Additional assistance / Altered mentation ?[]  - 0 ?Support Surface(s) Assessment (bed, cushion, seat, etc.) ?INTERVENTIONS - Miscellaneous ?[]  - External ear exam 0 ?[]  - 0 ?Patient Transfer (multiple staff / Civil Service fast streamer / Similar devices) ?[]  - 0 ?Simple Staple / Suture removal (25 or less) ?[]  - 0 ?Complex Staple / Suture removal (26 or more) ?[]  - 0 ?Hypo/Hyperglycemic Management (do not check if billed separately) ?[]  -  0 ?Ankle / Brachial Index (ABI) - do not check if billed separately ?Has the patient been seen at the hospital within the last  three years: Yes ?Total Score: 0 ?Level Of Care: ____ ?Jonathan Mcmillan, Jonathan Mcmillan ([id number]) ?Electronic Signature(s) ?Signed: 08/29/2021 9:16:22 AM By: Carlene Coria RN ?Entered By: Carlene Coria on 08/24/2021 13:02:17 ?Jonathan Mcmillan, Jonathan Mcmillan. ([id number]) ?-------------------------------------------------------------------------------- ?Encounter Discharge Information Details ?Patient Name: Jonathan Mcmillan, Jonathan Mcmillan. ?Date of Service: 08/24/2021 12:30 PM ?Medical Record Number: [id number] ?Patient Account Number: [id number] ?Date of Birth/Sex: 30-May-1995 (26 y.o. M) ?Treating RN: Carlene Coria ?Primary Care Belky Mundo: Denton Lank Other Clinician: ?Referring Larya Charpentier: Denton Lank ?Treating Lisel Siegrist/Extender: Jeri Cos ?Weeks in Treatment: 5 ?Encounter Discharge Information Items Post Procedure Vitals ?Discharge Condition: Stable ?Temperature (?F): 98.3 ?Ambulatory Status: Jonathan Mcmillan ?Pulse (bpm): 98 ?Discharge Destination: Home ?Respiratory Rate (breaths/min): 18 ?Transportation: Private Auto ?Blood Pressure (mmHg): 132/80 ?Accompanied By: self ?Schedule Follow-up Appointment: Yes ?Clinical Summary of Care: Patient Declined ?Electronic Signature(s) ?Signed: 08/29/2021 9:16:22 AM By: Carlene Coria RN ?Entered By: Carlene Coria on 08/24/2021 13:03:19 ?Jonathan Mcmillan, Jonathan Mcmillan. ([id number]) ?-------------------------------------------------------------------------------- ?Lower Extremity Assessment Details ?Patient Name: Jonathan Mcmillan, Jonathan Mcmillan. ?Date of Service: 08/24/2021 12:30 PM ?Medical Record Number: [id number] ?Patient Account Number: [id number] ?Date of Birth/Sex: 10/13/1995 (26 y.o. M) ?Treating RN: Carlene Coria ?Primary Care Jenaye Rickert: Denton Lank Other Clinician: ?Referring Laurence Crofford: Denton Lank ?Treating Joyce Heitman/Extender: Jeri Cos ?Weeks in Treatment: 5 ?Edema Assessment ?Assessed: [Left: No] [Right: No] ?Edema: [Left: Ye] [Right:  s] ?Calf ?Left: Right: ?Point of Measurement: 35 cm From Medial Instep 32 cm ?Ankle ?Left: Right: ?Point of Measurement: 10 cm From Medial Instep 21 cm ?Vascular Assessment ?Pulses: ?Dorsalis Pedis ?Palpable: [Left:Yes] ?Electronic Signature(s) ?Signed: 08/29/2021 9:16:22 AM By: Carlene Coria RN ?Entered By: Carlene Coria on 08/24/2021 12:47:33 ?Jonathan Mcmillan, Jonathan Mcmillan. ([id number]) ?-------------------------------------------------------------------------------- ?Multi Wound Chart Details ?Patient Name: Jonathan Mcmillan, Jonathan Mcmillan. ?Date of Service: 08/24/2021 12:30 PM ?Medical Record Number: [id number] ?Patient Account Number: [id number] ?Date of Birth/Sex: [birth year] (26 y.o. M) ?Treating RN: Carlene Coria ?Primary Care Esker Dever: Denton Lank Other Clinician: ?Referring Phallon Haydu: Denton Lank ?Treating Asalee Barrette/Extender: Jeri Cos ?Weeks in Treatment: 5 ?Vital Signs ?Height(in): 68 ?Pulse(bpm): 98 ?Weight(lbs): 146 ?Blood Pressure(mmHg): 132/80 ?Body Mass Index(BMI): 22.2 ?Temperature(??F): 98.3 ?Respiratory Rate(breaths/min): 18 ?Photos: [N/A:N/A] ?Wound Location: Sacrum Left, Lateral Foot N/A ?Wounding Event: Gradually Appeared Gradually Appeared N/A ?Primary Etiology: Pressure Ulcer Pressure Ulcer N/A ?Comorbid History: Hypertension, Type I Diabetes, End Hypertension, Type I Diabetes, End N/A ?Stage Renal Disease, Neuropathy Stage Renal Disease, Neuropathy ?Date Acquired: 05/31/2021 05/31/2021 N/A ?Weeks of Treatment: 5 5 N/A ?Wound Status: Open Open N/A ?Wound Recurrence: No No N/A ?Measurements L x W x D (cm) 6x2x3 2.7x5x2.4 N/A ?Area (cm?) : 9.425 10.603 N/A ?Volume (cm?) : 28.274 25.447 N/A ?% Reduction in Area: 68.30% -35.00% N/A ?% Reduction in Volume: 61.90% -1519.80% N/A ?Classification: Category/Stage IV Category/Stage IV N/A ?Exudate Amount: Medium Medium N/A ?Exudate Type: Serosanguineous Serosanguineous N/A ?Exudate Color: red, brown red, brown N/A ?Foul Odor After Cleansing: No Yes N/A ?Odor Anticipated Due to Product N/A  No N/A ?Use: ?Granulation Amount: Large (67-100%) None Present (0%) N/A ?Granulation Quality: Pink N/A N/A ?Necrotic Amount: Small (1-33%) Large (67-100%) N/A ?Necrotic Tissue: Adherent Henefer

## 2021-08-31 ENCOUNTER — Encounter: Payer: Self-pay | Admitting: Physical Medicine & Rehabilitation

## 2021-08-31 ENCOUNTER — Encounter: Payer: Medicare Other | Admitting: Physician Assistant

## 2021-08-31 DIAGNOSIS — L89154 Pressure ulcer of sacral region, stage 4: Secondary | ICD-10-CM | POA: Diagnosis not present

## 2021-08-31 NOTE — Progress Notes (Addendum)
BRIER, REID ([id number]) ?Visit Report for 08/31/2021 ?Chief Complaint Document Details ?Patient Name: Jonathan Mcmillan, Jonathan Mcmillan. ?Date of Service: 08/31/2021 8:45 AM ?Medical Record Number: [id number] ?Patient Account Number: [id number] ?Date of Birth/Sex: 1995/05/17 (26 y.o. M) ?Treating RN: Cornell Barman ?Primary Care Provider: Denton Lank Other Clinician: ?Referring Provider: Denton Lank ?Treating Provider/Extender: Jeri Cos ?Weeks in Treatment: 6 ?Information Obtained from: Patient ?Chief Complaint ?Sacral, left lateral foot, and right knee pressure ulcers ?Electronic Signature(s) ?Signed: 08/31/2021 8:55:47 AM By: Worthy Keeler PA-C ?Entered By: Worthy Keeler on 08/31/2021 08:55:47 ?JKWON, Jonathan Mcmillan. ([id number]) ?-------------------------------------------------------------------------------- ?Debridement Details ?Patient Name: Jonathan Mcmillan, Jonathan Mcmillan. ?Date of Service: 08/31/2021 8:45 AM ?Medical Record Number: [id number] ?Patient Account Number: [id number] ?Date of Birth/Sex: 09/27/1995 (26 y.o. M) ?Treating RN: Cornell Barman ?Primary Care Provider: Denton Lank Other Clinician: ?Referring Provider: Denton Lank ?Treating Provider/Extender: Jeri Cos ?Weeks in Treatment: 6 ?Debridement Performed for ?Wound #2 Left,Lateral Foot ?Assessment: ?Performed By: Physician Tommie Sams., PA-C ?Debridement Type: Debridement ?Level of Consciousness (Pre- ?Awake and Alert ?procedure): ?Pre-procedure Verification/Time Out ?Yes - 09:20 ?Taken: ?Pain Control: Lidocaine ?Total Area Debrided (L x W): 1.2 (cm) x 6 (cm) = 7.2 (cm?) ?Tissue and other material ?Viable, Non-Viable, Muscle, Slough, Subcutaneous, Biofilm, Slough ?debrided: ?Level: Skin/Subcutaneous Tissue/Muscle ?Debridement Description: Excisional ?Instrument: Curette, Forceps, Scissors ?Bleeding: Minimum ?Hemostasis Achieved: Pressure ?Response to Treatment: Procedure was tolerated well ?Level of Consciousness (Post- ?Awake and Alert ?procedure): ?Post Debridement Measurements  of Total Wound ?Length: (cm) 1.2 ?Stage: Category/Stage IV ?Width: (cm) 6 ?Depth: (cm) 2.8 ?Volume: (cm?) 15.834 ?Character of Wound/Ulcer Post Debridement: Stable ?Post Procedure Diagnosis ?Same as Pre-procedure ?Electronic Signature(s) ?Signed: 08/31/2021 4:12:25 PM By: Gretta Cool, BSN, RN, CWS, Kim RN, BSN ?Signed: 09/01/2021 5:32:35 PM By: Worthy Keeler PA-C ?Entered By: Gretta Cool, BSN, RN, CWS, Kim on 08/31/2021 09:25:26 ?Jonathan Mcmillan, Jonathan Mcmillan. ([id number]) ?-------------------------------------------------------------------------------- ?Debridement Details ?Patient Name: Jonathan Mcmillan, Jonathan Mcmillan. ?Date of Service: 08/31/2021 8:45 AM ?Medical Record Number: [id number] ?Patient Account Number: [id number] ?Date of Birth/Sex: 09-08-1995 (26 y.o. M) ?Treating RN: Cornell Barman ?Primary Care Provider: Denton Lank Other Clinician: ?Referring Provider: Denton Lank ?Treating Provider/Extender: Jeri Cos ?Weeks in Treatment: 6 ?Debridement Performed for ?Wound #1 Sacrum ?Assessment: ?Performed By: Physician Tommie Sams., PA-C ?Debridement Type: Debridement ?Level of Consciousness (Pre- ?Awake and Alert ?procedure): ?Pre-procedure Verification/Time Out ?Yes - 09:20 ?Taken: ?Pain Control: Lidocaine ?Total Area Debrided (L x W): 1 (cm) x 1 (cm) = 1 (cm?) ?Tissue and other material ?Viable, Non-Viable, Slough, Subcutaneous, [place] ?debrided: ?Level: Skin/Subcutaneous Tissue ?Debridement Description: Excisional ?Instrument: Forceps, Scissors ?Bleeding: Minimum ?Hemostasis Achieved: Pressure ?Response to Treatment: Procedure was tolerated well ?Level of Consciousness (Post- ?Awake and Alert ?procedure): ?Post Debridement Measurements of Total Wound ?Length: (cm) 6 ?Stage: Category/Stage IV ?Width: (cm) 1.2 ?Depth: (cm) 2.8 ?Volume: (cm?) 15.834 ?Character of Wound/Ulcer Post Debridement: Stable ?Post Procedure Diagnosis ?Same as Pre-procedure ?Electronic Signature(s) ?Signed: 08/31/2021 4:12:25 PM By: Gretta Cool, BSN, RN, CWS, Kim RN, BSN ?Signed:  09/01/2021 5:32:35 PM By: Worthy Keeler PA-C ?Entered By: Gretta Cool, BSN, RN, CWS, Kim on 08/31/2021 09:31:07 ?Jonathan Mcmillan, Jonathan Mcmillan. ([id number]) ?-------------------------------------------------------------------------------- ?HPI Details ?Patient Name: Jonathan Mcmillan, Jonathan Mcmillan. ?Date of Service: 08/31/2021 8:45 AM ?Medical Record Number: [id number] ?Patient Account Number: [id number] ?Date of Birth/Sex: 12/29/1995 (26 y.o. M) ?Treating RN: Cornell Barman ?Primary Care Provider: Denton Lank Other Clinician: ?Referring Provider: Denton Lank ?Treating Provider/Extender: Jeri Cos ?Weeks in Treatment: 6 ?History of Present Illness ?HPI Description: 07/14/2021 patient presents today for initial inspection here in the clinic concerning an issue that he has  been having with ?wounds over the left lateral foot, right knee, and the largest of these is the sacral region where there is significant necrotic tissue noted in the ?base of this wound. Subsequently the patient is referred to [place] from what appears to be primary care in order for further management of his ?wounds. He also sees physical medicine and rehabilitation. I did receive a lot of the notes from there as far as what is been going on with regard ?to this gentleman. ?Patient is a 26 year old male who had an anoxic encephalopathy, end-stage renal disease on hemodialysis, he has been disabled with generalized ?weakness and subsequently developed a sacral decubitus ulcer which apparently was present before he went into the hospital according to what is ?not tells me but worsened during the time in the ICU simply because he was unable to be turned with all the equipment that he was hooked up to. ?Again it sounds like they did what they could to try to keep this under control but nonetheless due to the severity of his illness it sounds as if he is ?lucky to be alive to be perfectly honest. Subsequently the patient also does have diabetes mellitus, hypertension, nicotine dependence, he did  go ?to the emergency department on 07/05/2021 due to a "abscess" in the gluteal region. The family member had seen this and wanted him to be ?checked out. During that time medical staff wanted to admit the patient but he refused. On 07/06/2021 the patient was subsequently discharged ?from the hospital [place] and apparently they were trying to get in touch according to the notes with his aunt who is the legal ?guardian but were not able to. That was around 12:37 AM. Nonetheless in the interim since they have been basically at home using Santyl which ?was the recommendation from the hospital. This has been on all locations. Dry dressings have been applied following. Unfortunately this just does ?not seem to be doing quite enough especially for the sacral area which is a significant wound to be honest. We do not have any sign of an x-ray ?that was done of the sacral area nor that they know of anything in particular that was done at the hospital. With all that being said I think an ?x-ray would be a good idea based on what I am seeing currently this does appear to be quite significant from a wound care perspective. ?3/14; patient presents for follow-up. He has no issues or complaints today. He has been using [employer]'s wet-to-dry dressings to the wound beds. He ?denies signs of infection. He reports Currently taking Augmentin. ?08/08/2021 upon evaluation today patient actually appears to be doing better in regard to his wounds in the sacral region as well as the knee both ?of which are showing signs of excellent improvement. In regard to his foot I am not certain exactly what his blood flow is at this point. That ?something but I am going to spend a little bit of time looking into the chart regarding. Fortunately I do not see any signs of infection but I do ?believe he is probably can need some sharp debridement here but I do not want to perform this until we can confirm good blood flow. I know we ?were able  to do this first visit because he was in the bed in the room where we can get him in position. We also were not able to do at last visit as ?we did not end up actually seeing him  due to some incontinence iss

## 2021-08-31 NOTE — Progress Notes (Addendum)
DERECK, AGERTON ([id number]) ?Visit Report for 08/31/2021 ?Arrival Information Details ?Patient Name: Jonathan Mcmillan, Jonathan Mcmillan. ?Date of Service: 08/31/2021 8:45 AM ?Medical Record Number: [id number] ?Patient Account Number: [id number] ?Date of Birth/Sex: [birth year] (26 y.o. M) ?Treating RN: Cornell Barman ?Primary Care Haydyn Girvan: Denton Lank Other Clinician: ?Referring Josslin Sanjuan: Denton Lank ?Treating Liat Mayol/Extender: Jeri Cos ?Weeks in Treatment: 6 ?Visit Information History Since Last Visit ?Added or deleted any medications: No ?Patient Arrived: Kasandra Knudsen ?Has Dressing in Place as Prescribed: Yes ?Arrival Time: 08:58 ?Pain Present Now: Yes ?Transfer Assistance: None ?Patient Identification Verified: Yes ?Secondary Verification Process Completed: Yes ?Patient Requires Transmission-Based No ?Precautions: ?Patient Has Alerts: Yes ?Patient Alerts: Type 2 diabetic ?NON ?COMPRESSABLE ?Electronic Signature(s) ?Signed: 08/31/2021 4:12:25 PM By: Gretta Cool, BSN, RN, CWS, Kim RN, BSN ?Entered By: Gretta Cool, BSN, RN, CWS, Kim on 08/31/2021 08:58:34 ?Jonathan Mcmillan, Jonathan Mcmillan. ([id number]) ?-------------------------------------------------------------------------------- ?Encounter Discharge Information Details ?Patient Name: Jonathan Mcmillan, Jonathan Mcmillan. ?Date of Service: 08/31/2021 8:45 AM ?Medical Record Number: [id number] ?Patient Account Number: [id number] ?Date of Birth/Sex: 12-21-1995 (26 y.o. M) ?Treating RN: Cornell Barman ?Primary Care Suhayb Anzalone: Denton Lank Other Clinician: ?Referring Baudelia Schroepfer: Denton Lank ?Treating Merranda Bolls/Extender: Jeri Cos ?Weeks in Treatment: 6 ?Encounter Discharge Information Items Post Procedure Vitals ?Discharge Condition: Stable ?Temperature (?F): 97.9 ?Ambulatory Status: Ambulatory ?Pulse (bpm): 92 ?Discharge Destination: Home ?Respiratory Rate (breaths/min): 18 ?Transportation: Private Auto ?Blood Pressure (mmHg): 148/96 ?Schedule Follow-up Appointment: Yes ?Clinical Summary of Care: ?Electronic Signature(s) ?Signed: 08/31/2021 4:12:25 PM  By: Gretta Cool, BSN, RN, CWS, Kim RN, BSN ?Entered By: Gretta Cool, BSN, RN, CWS, Kim on 08/31/2021 09:46:15 ?Jonathan Mcmillan, Jonathan Mcmillan. ([id number]) ?-------------------------------------------------------------------------------- ?Lower Extremity Assessment Details ?Patient Name: Jonathan Mcmillan, Jonathan Mcmillan. ?Date of Service: 08/31/2021 8:45 AM ?Medical Record Number: [id number] ?Patient Account Number: [id number] ?Date of Birth/Sex: 05/24/1995 (26 y.o. M) ?Treating RN: Cornell Barman ?Primary Care Jillian Pianka: Denton Lank Other Clinician: ?Referring Dannon Perlow: Denton Lank ?Treating Legacie Dillingham/Extender: Jeri Cos ?Weeks in Treatment: 6 ?Vascular Assessment ?Pulses: ?Dorsalis Pedis ?Palpable: [Left:Yes] ?Electronic Signature(s) ?Signed: 08/31/2021 4:12:25 PM By: Gretta Cool, BSN, RN, CWS, Kim RN, BSN ?Entered By: Gretta Cool, BSN, RN, CWS, Kim on 08/31/2021 09:17:59 ?Jonathan Mcmillan, Jonathan Mcmillan. ([id number]) ?-------------------------------------------------------------------------------- ?Multi Wound Chart Details ?Patient Name: Jonathan Mcmillan, Jonathan Mcmillan. ?Date of Service: 08/31/2021 8:45 AM ?Medical Record Number: [id number] ?Patient Account Number: [id number] ?Date of Birth/Sex: 1996/04/18 (26 y.o. M) ?Treating RN: Cornell Barman ?Primary Care Dvaughn Fickle: Denton Lank Other Clinician: ?Referring Cephus Tupy: Denton Lank ?Treating Hila Bolding/Extender: Jeri Cos ?Weeks in Treatment: 6 ?Vital Signs ?Height(in): 68 ?Pulse(bpm): 92 ?Weight(lbs): 146 ?Blood Pressure(mmHg): 148/96 ?Body Mass Index(BMI): 22.2 ?Temperature(??F): 97.9 ?Respiratory Rate(breaths/min): 18 ?Photos: [1:No Photos] [2:No Photos] [N/A:N/A] ?Wound Location: [1:Sacrum] [2:Left, Lateral Foot] [N/A:N/A] ?Wounding Event: [1:Gradually Appeared] [2:Gradually Appeared] [N/A:N/A] ?Primary Etiology: [1:Pressure Ulcer] [2:Pressure Ulcer] [N/A:N/A] ?Comorbid History: [1:Hypertension, Type I Diabetes, End Stage Renal Disease, Neuropathy] [2:Hypertension, Type I Diabetes, End Stage Renal Disease, Neuropathy] [N/A:N/A] ?Date Acquired:  [1:05/31/2021] [2:05/31/2021] [N/A:N/A] ?Weeks of Treatment: [1:6] [2:6] [N/A:N/A] ?Wound Status: [1:Open] [2:Open] [N/A:N/A] ?Wound Recurrence: [1:No] [2:No] [N/A:N/A] ?Measurements L x W x D (cm) [1:4x3x2.8] [2:1.2x6x2.8] [N/A:N/A] ?Area (cm?) : [1:9.425] [2:5.655] [N/A:N/A] ?Volume (cm?) : [1:26.389] [2:15.834] [N/A:N/A] ?% Reduction in Area: [1:68.30%] [2:28.00%] [N/A:N/A] ?% Reduction in Volume: [1:64.40%] [2:-907.90%] [N/A:N/A] ?Position 1 (o'clock): [2:11] ?Maximum Distance 1 (cm): [2:3] ?Starting Position 1 (o'clock): [1:6] ?Ending Position 1 (o'clock): [1:10] ?Maximum Distance 1 (cm): [1:1.5] ?Tunneling: [1:N/A] [2:Yes] [N/A:N/A] ?Undermining: [1:Yes] [2:N/A] [N/A:N/A] ?Classification: [1:Category/Stage IV] [2:Category/Stage IV] [N/A:N/A] ?Exudate Amount: [1:Large] [2:Medium] [N/A:N/A] ?Exudate Type: [1:Serous] [2:Serosanguineous] [N/A:N/A] ?Exudate Color: [1:amber] [2:red, brown] [N/A:N/A] ?Foul Odor After Cleansing: [1:Yes] [2:Yes] [N/A:N/A] ?Odor  Anticipated Due to Product [1:No] [2:No] [N/A:N/A] ?Use: ?Granulation Amount: [1:Medium (34-66%)] [2:Large (67-100%)] [N/A:N/A] ?Granulation Quality: [1:Pink] [2:Red, Hyper-granulation] [N/A:N/A] ?Necrotic Amount: [1:Medium (34-66%)] [2:Small (1-33%)] [N/A:N/A] ?Exposed Structures: ?[1:Fat Layer (Subcutaneous Tissue): Yes Muscle: Yes Bone: Yes Fascia: No Tendon: No Joint: No None] [2:Muscle: Yes Bone: Yes Fascia: No Fat Layer (Subcutaneous Tissue): No Tendon: No Joint: No None] [N/A:N/A N/A] ?Treatment Notes ?Electronic Signature(s) ?Signed: 08/31/2021 4:12:25 PM By: Gretta Cool, BSN, RN, CWS, Kim RN, BSN ?Entered By: Gretta Cool, BSN, RN, CWS, Kim on 08/31/2021 09:23:20 ?Jonathan Mcmillan, Jonathan Mcmillan. ([id number]) ?-------------------------------------------------------------------------------- ?Multi-Disciplinary Care Plan Details ?Patient Name: Jonathan Mcmillan, Jonathan Mcmillan. ?Date of Service: 08/31/2021 8:45 AM ?Medical Record Number: [id number] ?Patient Account Number: [id number] ?Date of Birth/Sex:  [birth year] (26 y.o. M) ?Treating RN: Cornell Barman ?Primary Care Ellanie Oppedisano: Denton Lank Other Clinician: ?Referring Naoki Migliaccio: Denton Lank ?Treating Benigna Delisi/Extender: Jeri Cos ?Weeks in Treatment: 6 ?Active Inactive ?Necrotic Tissue ?Nursing Diagnoses: ?Impaired tissue integrity related to necrotic/devitalized tissue ?Knowledge deficit related to management of necrotic/devitalized tissue ?Goals: ?Necrotic/devitalized tissue will be minimized in the wound bed ?Date Initiated: 08/31/2021 ?Target Resolution Date: 08/31/2021 ?Goal Status: Active ?Patient/caregiver will verbalize understanding of reason and process for debridement of necrotic tissue ?Date Initiated: 08/31/2021 ?Target Resolution Date: 08/31/2021 ?Goal Status: Active ?Interventions: ?Assess patient pain level pre-, during and post procedure and prior to discharge ?Provide education on necrotic tissue and debridement process ?Treatment Activities: ?Excisional debridement : 08/31/2021 ?Notes: ?Soft Tissue Infection ?Nursing Diagnoses: ?Impaired tissue integrity ?Knowledge deficit related to disease process and management ?Knowledge deficit related to home infection control: handwashing, handling of soiled dressings, supply storage ?Potential for infection: soft tissue ?Goals: ?Patient will remain free of wound infection ?Date Initiated: 08/31/2021 ?Target Resolution Date: 08/31/2021 ?Goal Status: Active ?Patient/caregiver will verbalize understanding of or measures to prevent infection and contamination in the home setting ?Date Initiated: 08/31/2021 ?Target Resolution Date: 08/31/2021 ?Goal Status: Active ?Patient's soft tissue infection will resolve ?Date Initiated: 08/31/2021 ?Target Resolution Date: 08/31/2021 ?Goal Status: Active ?Signs and symptoms of infection will be recognized early to allow for prompt treatment ?Date Initiated: 08/31/2021 ?Target Resolution Date: 08/31/2021 ?Goal Status: Active ?Interventions: ?Assess signs and symptoms of infection every  visit ?Provide education on infection ?Treatment Activities: ?Culture and sensitivity : 08/31/2021 ?Systemic antibiotics : 08/31/2021 ?Jonathan Mcmillan, Jonathan Mcmillan ([id number]) ?Notes: ?Wound/Skin Impairment ?Nursing Diagnoses: ?Kno

## 2021-09-05 ENCOUNTER — Ambulatory Visit (INDEPENDENT_AMBULATORY_CARE_PROVIDER_SITE_OTHER): Payer: Medicare Other | Admitting: Cardiology

## 2021-09-05 ENCOUNTER — Ambulatory Visit: Payer: Self-pay | Admitting: Interventional Cardiology

## 2021-09-05 ENCOUNTER — Encounter: Payer: Self-pay | Admitting: Cardiology

## 2021-09-05 VITALS — BP 140/92 | HR 98 | Ht 68.0 in | Wt 137.0 lb

## 2021-09-05 DIAGNOSIS — I480 Paroxysmal atrial fibrillation: Secondary | ICD-10-CM | POA: Diagnosis not present

## 2021-09-05 DIAGNOSIS — F17200 Nicotine dependence, unspecified, uncomplicated: Secondary | ICD-10-CM

## 2021-09-05 DIAGNOSIS — I429 Cardiomyopathy, unspecified: Secondary | ICD-10-CM | POA: Diagnosis not present

## 2021-09-05 MED ORDER — ENTRESTO 49-51 MG PO TABS
1.0000 | ORAL_TABLET | Freq: Two times a day (BID) | ORAL | 5 refills | Status: DC
Start: 2021-09-05 — End: 2024-02-12

## 2021-09-05 NOTE — Patient Instructions (Signed)
Medication Instructions:  ? ?Your physician has recommended you make the following change in your medication:  ? ? INCREASE your Entresto to 49-51 MG twice a day. ? ?*If you need a refill on your cardiac medications before your next appointment, please call your pharmacy* ? ? ?Lab Work: ?None ordered ? ?If you have labs (blood work) drawn today and your tests are completely normal, you will receive your results only by: ?MyChart Message (if you have MyChart) OR ?A paper copy in the mail ?If you have any lab test that is abnormal or we need to change your treatment, we will call you to review the results. ? ? ?Testing/Procedures: ?None ordered ? ? ?Follow-Up: ?At [facility], you and your health needs are our priority.  As part of our continuing mission to provide you with exceptional heart care, we have created designated Provider Care Teams.  These Care Teams include your primary Cardiologist (physician) and Advanced Practice Providers (APPs -  Physician Assistants and Nurse Practitioners) who all work together to provide you with the care you need, when you need it. ? ?We recommend signing up for the patient portal called "MyChart".  Sign up information is provided on this After Visit Summary.  MyChart is used to connect with patients for Virtual Visits (Telemedicine).  Patients are able to view lab/test results, encounter notes, upcoming appointments, etc.  Non-urgent messages can be sent to your provider as well.   ?To learn more about what you can do with MyChart, go to [email or web address].   ? ?Your next appointment:   ?6 week(s) ? ?The format for your next appointment:   ?In Person ? ?Provider:   ?You may see Kate Sable, MD or one of the following Advanced Practice Providers on your designated Care Team:   ?Murray Hodgkins, NP ?Christell Faith, PA-C ?Cadence Kathlen Mody, PA-C  ? ? ?Other Instructions ? ? ?Important Information About Sugar ? ? ? ? ? ? ?

## 2021-09-05 NOTE — Progress Notes (Signed)
?Cardiology Office Note:   ? ?Date:  09/05/2021  ? ?ID:  [name], DOB [birth year], MRN [id number] ? ?PCP:  Denton Lank, MD ?  ?[facility] Providers ?Cardiologist:  None    ? ?Referring MD: Charlett Blake, MD  ? ?Chief Complaint  ?Patient presents with  ? New Patient (Initial Visit)  ?  Hospital follow up -- Anoxic encephalopathy. Patient c/o swelling in feet. Meds reviewed verbally with patient.   ? ? ?History of Present Illness:   ? ?TAEVEON KEESLING is a 26 y.o. male with a hx of diabetes, atrial fibrillation, no anticoagulation due to thrombocytopenia, current smoker, ESRD on HD MWF, who presents for hospital follow-up. ? ?Patient seen in the hospital January/2023 after cardiac arrest.  Per EMR reports, patient presented to the ED with chills and vomiting, noted to have profound anemia and renal dysfunction.  Suffered a PEA arrest likely secondary to metabolic derangement requiring intubation, CRRT.  Developed A-fib, amiodarone was initiated to maintain sinus rhythm.  Anticoagulation not administered due to significant anemia.  Denies chest pain, denies shortness of breath, states doing okay, ? ? ?Echocardiogram 06/01/2021 showed EF 35 to 40%.  He was started on Coreg, Entresto. ? ?Past Medical History:  ?Diagnosis Date  ? Cannabinoid hyperemesis syndrome   ? CKD (chronic kidney disease), stage IV ([place])   ? Diabetes 1.5, managed as type 1 ([place])   ? DKA (diabetic ketoacidoses) 05/21/2016  ? Gastroparesis   ? HTN (hypertension)   ? Nicotine dependence   ? Perirectal abscess 06/16/2017  ? Right arm cellulitis 01/26/2018  ? ? ?Past Surgical History:  ?Procedure Laterality Date  ? COLONOSCOPY WITH PROPOFOL N/A 06/21/2021  ? Procedure: COLONOSCOPY WITH PROPOFOL;  Surgeon: Lin Landsman, MD;  Location: [facility] ENDOSCOPY;  Service: Gastroenterology;  Laterality: N/A;  ? DIALYSIS/PERMA CATHETER INSERTION N/A 06/15/2021  ? Procedure: DIALYSIS/PERMA CATHETER INSERTION;  Surgeon: Algernon Huxley, MD;  Location: [facility];  Service: Cardiovascular;  Laterality: N/A;  ? DIALYSIS/PERMA CATHETER INSERTION N/A 06/19/2021  ? Procedure: DIALYSIS/PERMA CATHETER INSERTION;  Surgeon: Algernon Huxley, MD;  Location: [facility];  Service: Cardiovascular;  Laterality: N/A;  ? INCISION AND DRAINAGE PERIRECTAL ABSCESS N/A 06/16/2017  ? Procedure: IRRIGATION AND DEBRIDEMENT PERIRECTAL ABSCESS;  Surgeon: Florene Glen, MD;  Location: [facility] ORS;  Service: General;  Laterality: N/A;  ? none    ? ? ?Current Medications: ?Current Meds  ?Medication Sig  ? Accu-Chek Softclix Lancets lancets Use aas directed up to 4 times per day.  ? acetaminophen (TYLENOL) 325 MG tablet Take 1-2 tablets (325-650 mg total) by mouth every 4 (four) hours as needed for mild pain.  ? amiodarone (PACERONE) 200 MG tablet Take 1 tablet (200 mg total) by mouth daily.  ? ascorbic acid (VITAMIN C) 500 MG tablet Take 1 tablet (500 mg total) by mouth 2 (two) times daily.  ? Blood Glucose Monitoring Suppl (BLOOD GLUCOSE MONITOR SYSTEM) w/Device KIT Use as directed up to 4 times per day.  ? calcitRIOL (ROCALTROL) 0.5 MCG capsule Take 1 capsule (0.5 mcg total) by mouth daily.  ? calcium carbonate (TUMS - DOSED IN MG ELEMENTAL CALCIUM) 500 MG chewable tablet Chew 1 tablet (200 mg of elemental calcium total) by mouth 3 (three) times daily.  ? carvedilol (COREG) 3.125 MG tablet Take 1 tablet (3.125 mg total) by mouth 2 (two) times daily with a meal.  ? cefdinir (OMNICEF) 300 MG capsule Take by mouth.  ? cinacalcet (  SENSIPAR) 30 MG tablet Take 1 tablet (30 mg total) by mouth daily with breakfast.  ? collagenase (SANTYL) ointment Apply topically daily.  ? Darbepoetin Alfa (ARANESP) 60 MCG/0.3ML SOSY injection Inject 0.3 mLs (60 mcg total) into the vein every Friday with hemodialysis.  ? ferrous sulfate 325 (65 FE) MG tablet Take 1 tablet (325 mg total) by mouth daily with breakfast.  ? glucose blood test strip Use as directed  ? hydrocerin (EUCERIN) CREA Apply 1  application topically 2 (two) times daily.  ? insulin aspart (NOVOLOG) 100 UNIT/ML FlexPen Inject 0-6 Units into the skin 3 (three) times daily with meals.  ? insulin aspart (NOVOLOG) 100 UNIT/ML injection Inject 0-15 Units into the skin 3 (three) times daily with meals. For blood glucose to 70-150 give 0 units; 151 to 200 give 1 unit; 201 to 250 give 2 units, 251-300 give 3 units. 301 or greater, call primary care physician..  ? Insulin Pen Needle 32G X 4 MM MISC Use as directed  ? lipase/protease/amylase (CREON) 36000 UNITS CPEP capsule Take 1 capsule (36,000 Units total) by mouth 3 (three) times daily with meals.  ? loperamide (IMODIUM) 2 MG capsule Take 1 capsule (2 mg total) by mouth 2 (two) times daily.  ? methocarbamol (ROBAXIN) 500 MG tablet Take 1 tablet (500 mg total) by mouth every 6 (six) hours as needed for muscle spasms.  ? multivitamin (RENA-VIT) TABS tablet Take 1 tablet by mouth at bedtime.  ? Nutritional Supplements (,FEEDING SUPPLEMENT, PROSOURCE PLUS) liquid Take 30 mLs by mouth 3 (three) times daily between meals.  ? ondansetron (ZOFRAN) 4 MG tablet Take 1 tablet (4 mg total) by mouth every 8 (eight) hours as needed for up to 10 doses for nausea or vomiting.  ? oxyCODONE (OXY IR/ROXICODONE) 5 MG immediate release tablet Take 1 tablet (5 mg total) by mouth every 4 (four) hours as needed for severe pain.  ? oxyCODONE-acetaminophen (PERCOCET) 5-325 MG tablet Take 1 tablet by mouth every 4 (four) hours as needed for severe pain.  ? sacubitril-valsartan (ENTRESTO) 49-51 MG Take 1 tablet by mouth 2 (two) times daily.  ? thiamine 100 MG tablet Take 1 tablet (100 mg total) by mouth daily.  ? [DISCONTINUED] sacubitril-valsartan (ENTRESTO) 24-26 MG Take 1 tablet by mouth 2 (two) times daily.  ?  ? ?Allergies:   Patient has no known allergies.  ? ?Social History  ? ?Socioeconomic History  ? Marital status: Single  ?  Spouse name: Not on file  ? Number of children: Not on file  ? Years of education: Not on  file  ? Highest education level: Not on file  ?Occupational History  ? Occupation: unemployed  ?Tobacco Use  ? Smoking status: Every Day  ?  Packs/day: 0.50  ?  Types: Cigarettes  ? Smokeless tobacco: Never  ?Vaping Use  ? Vaping Use: Never used  ?Substance and Sexual Activity  ? Alcohol use: No  ? Drug use: Yes  ?  Types: Marijuana  ? Sexual activity: Not on file  ?Other Topics Concern  ? Not on file  ?Social History Narrative  ? Not on file  ? ?Social Determinants of Health  ? ?Financial Resource Strain: Not on file  ?Food Insecurity: Not on file  ?Transportation Needs: Not on file  ?Physical Activity: Not on file  ?Stress: Not on file  ?Social Connections: Not on file  ?  ? ?Family History: ?The patient's family history includes Diabetes in his mother. ? ?ROS:   ?Please  see the history of present illness.    ? All other systems reviewed and are negative. ? ?EKGs/Labs/Other Studies Reviewed:   ? ?The following studies were reviewed today: ? ? ?EKG:  EKG is  ordered today.  The ekg ordered today demonstrates normal sinus rhythm ? ?Recent Labs: ?06/04/2021: B Natriuretic Peptide 3,692.3 ?06/16/2021: Magnesium 2.3 ?07/05/2021: ALT 10; BUN 12; Creatinine, Ser 2.14; Hemoglobin 6.6; Platelets 207; Potassium 3.7; Sodium 138  ?Recent Lipid Panel ?   ?Component Value Date/Time  ? TRIG 110 06/08/2021 0407  ? ? ? ?Risk Assessment/Calculations:   ? ? ?    ? ?Physical Exam:   ? ?VS:  BP (!) 140/92 (BP Location: Left Arm, Patient Position: Sitting, Cuff Size: Normal)   Pulse 98   Ht 5' 8" (1.727 m)   Wt 137 lb (62.1 kg)   SpO2 98%   BMI 20.83 kg/m?    ? ?Wt Readings from Last 3 Encounters:  ?09/05/21 137 lb (62.1 kg)  ?07/13/21 143 lb 9.6 oz (65.1 kg)  ?07/05/21 155 lb 6.8 oz (70.5 kg)  ?  ? ?GEN:  Well nourished, well developed in no acute distress ?HEENT: Normal ?NECK: No JVD; No carotid bruits ?LYMPHATICS: No lymphadenopathy ?CARDIAC: RRR, no murmurs, rubs, gallops ?RESPIRATORY: Poor inspiratory effort ?ABDOMEN: Soft,  non-tender, non-distended ?MUSCULOSKELETAL:  No edema; No deformity  ?SKIN: Warm and dry ?NEUROLOGIC:  Alert and oriented x 3 ?PSYCHIATRIC:  Normal affect  ? ?ASSESSMENT:   ? ?1. Cardiomyopathy, unspecified type ([place])

## 2021-09-07 ENCOUNTER — Encounter: Payer: Medicare Other | Admitting: Internal Medicine

## 2021-09-07 DIAGNOSIS — L89154 Pressure ulcer of sacral region, stage 4: Secondary | ICD-10-CM | POA: Diagnosis not present

## 2021-09-08 NOTE — Progress Notes (Signed)
Jonathan Mcmillan ([id number]) ?Visit Report for 09/07/2021 ?Arrival Information Details ?Patient Name: Jonathan Mcmillan, Jonathan Mcmillan. ?Date of Service: 09/07/2021 11:00 AM ?Medical Record Number: [id number] ?Patient Account Number: [id number] ?Date of Birth/Sex: [birth year] (26 y.o. M) ?Treating RN: Carlene Coria ?Primary Care Shakeela Rabadan: Denton Lank Other Clinician: ?Referring Alianis Trimmer: Denton Lank ?Treating Aayliah Rotenberry/Extender: Ricard Dillon ?Weeks in Treatment: 7 ?Visit Information History Since Last Visit ?All ordered tests and consults were completed: No ?Patient Arrived: Ambulatory ?Added or deleted any medications: No ?Arrival Time: 11:09 ?Any new allergies or adverse reactions: No ?Accompanied By: self ?Had a fall or experienced change in No ?Transfer Assistance: None ?activities of daily living that may affect ?Patient Identification Verified: Yes ?risk of falls: ?Secondary Verification Process Completed: Yes ?Signs or symptoms of abuse/neglect since last visito No ?Patient Requires Transmission-Based No ?Hospitalized since last visit: No ?Precautions: ?Implantable device outside of the clinic excluding No ?Patient Has Alerts: Yes ?cellular tissue based products placed in the center ?Patient Alerts: Type 2 diabetic ?since last visit: ?NON ?Has Dressing in Place as Prescribed: Yes ?COMPRESSABLE ?Pain Present Now: No ?Electronic Signature(s) ?Signed: 09/08/2021 1:52:28 PM By: Carlene Coria RN ?Entered By: Carlene Coria on 09/07/2021 11:12:15 ?Jonathan Mcmillan, Jonathan Mcmillan. ([id number]) ?-------------------------------------------------------------------------------- ?Clinic Level of Care Assessment Details ?Patient Name: Jonathan Mcmillan, Jonathan Mcmillan. ?Date of Service: 09/07/2021 11:00 AM ?Medical Record Number: [id number] ?Patient Account Number: [id number] ?Date of Birth/Sex: [birth year] (26 y.o. M) ?Treating RN: Carlene Coria ?Primary Care Aryeh Butterfield: Denton Lank Other Clinician: ?Referring Jaquese Irving: Denton Lank ?Treating Alaynah Schutter/Extender: Ricard Dillon ?Weeks in Treatment: 7 ?Clinic Level of Care Assessment Items ?TOOL 4 Quantity Score ?X - Use when only an EandM is performed on FOLLOW-UP visit 1 0 ?ASSESSMENTS - Nursing Assessment / Reassessment ?X - Reassessment of Co-morbidities (includes updates in patient status) 1 10 ?X- 1 5 ?Reassessment of Adherence to Treatment Plan ?ASSESSMENTS - Wound and Skin Assessment / Reassessment ?[]  - Simple Wound Assessment / Reassessment - one wound 0 ?X- 2 5 ?Complex Wound Assessment / Reassessment - multiple wounds ?[]  - 0 ?Dermatologic / Skin Assessment (not related to wound area) ?ASSESSMENTS - Focused Assessment ?[]  - Circumferential Edema Measurements - multi extremities 0 ?[]  - 0 ?Nutritional Assessment / Counseling / Intervention ?[]  - 0 ?Lower Extremity Assessment (monofilament, tuning fork, pulses) ?[]  - 0 ?Peripheral Arterial Disease Assessment (using hand held doppler) ?ASSESSMENTS - Ostomy and/or Continence Assessment and Care ?[]  - Incontinence Assessment and Management 0 ?[]  - 0 ?Ostomy Care Assessment and Management (repouching, etc.) ?PROCESS - Coordination of Care ?X - Simple Patient / Family Education for ongoing care 1 15 ?[]  - 0 ?Complex (extensive) Patient / Family Education for ongoing care ?[]  - 0 ?Staff obtains Consents, Records, Test Results / Process Orders ?[]  - 0 ?Staff telephones HHA, Nursing Homes / Clarify orders / etc ?[]  - 0 ?Routine Transfer to another Facility (non-emergent condition) ?[]  - 0 ?Routine Hospital Admission (non-emergent condition) ?[]  - 0 ?New Admissions / Biomedical engineer / Ordering NPWT, Apligraf, etc. ?[]  - 0 ?Emergency Hospital Admission (emergent condition) ?X- 1 10 ?Simple Discharge Coordination ?[]  - 0 ?Complex (extensive) Discharge Coordination ?PROCESS - Special Needs ?[]  - Pediatric / Minor Patient Management 0 ?[]  - 0 ?Isolation Patient Management ?[]  - 0 ?Hearing / Language / Visual special needs ?[]  - 0 ?Assessment of Community assistance (transportation,  D/C planning, etc.) ?[]  - 0 ?Additional assistance / Altered mentation ?[]  - 0 ?Support Surface(s) Assessment (bed, cushion, seat, etc.) ?INTERVENTIONS - Wound Cleansing / Measurement ?Jonathan [address] M. (  [id number]) ?[]  - 0 ?Simple Wound Cleansing - one wound ?X- 2 5 ?Complex Wound Cleansing - multiple wounds ?X- 1 5 ?Wound Imaging (photographs - any number of wounds) ?[]  - 0 ?Wound Tracing (instead of photographs) ?[]  - 0 ?Simple Wound Measurement - one wound ?X- 2 5 ?Complex Wound Measurement - multiple wounds ?INTERVENTIONS - Wound Dressings ?[]  - Small Wound Dressing one or multiple wounds 0 ?X- 2 15 ?Medium Wound Dressing one or multiple wounds ?[]  - 0 ?Large Wound Dressing one or multiple wounds ?[]  - 0 ?Application of Medications - topical ?[]  - 0 ?Application of Medications - injection ?INTERVENTIONS - Miscellaneous ?[]  - External ear exam 0 ?[]  - 0 ?Specimen Collection (cultures, biopsies, blood, body fluids, etc.) ?[]  - 0 ?Specimen(s) / Culture(s) sent or taken to Lab for analysis ?[]  - 0 ?Patient Transfer (multiple staff / Civil Service fast streamer / Similar devices) ?[]  - 0 ?Simple Staple / Suture removal (25 or less) ?[]  - 0 ?Complex Staple / Suture removal (26 or more) ?[]  - 0 ?Hypo / Hyperglycemic Management (close monitor of Blood Glucose) ?[]  - 0 ?Ankle / Brachial Index (ABI) - do not check if billed separately ?X- 1 5 ?Vital Signs ?Has the patient been seen at the hospital within the last three years: Yes ?Total Score: 110 ?Level Of Care: New/Established - Level ?3 ?Electronic Signature(s) ?Signed: 09/08/2021 1:52:28 PM By: Carlene Coria RN ?Entered By: Carlene Coria on 09/07/2021 11:36:06 ?Jonathan Mcmillan, Jonathan Mcmillan. ([id number]) ?-------------------------------------------------------------------------------- ?Encounter Discharge Information Details ?Patient Name: Jonathan Mcmillan, Jonathan Mcmillan. ?Date of Service: 09/07/2021 11:00 AM ?Medical Record Number: [id number] ?Patient Account Number: [id number] ?Date of Birth/Sex: [birth year] (26 y.o.  M) ?Treating RN: Carlene Coria ?Primary Care Aaleigha Bozza: Denton Lank Other Clinician: ?Referring Deshon Hsiao: Denton Lank ?Treating Annet Manukyan/Extender: Ricard Dillon ?Weeks in Treatment: 7 ?Encounter Discharge Information Items ?Discharge Condition: Stable ?Ambulatory Status: Ambulatory ?Discharge Destination: Home ?Transportation: Private Auto ?Accompanied By: self ?Schedule Follow-up Appointment: Yes ?Clinical Summary of Care: Patient Declined ?Electronic Signature(s) ?Signed: 09/07/2021 11:50:11 AM By: Carlene Coria RN ?Entered By: Carlene Coria on 09/07/2021 11:50:11 ?Jonathan Mcmillan, Jonathan Mcmillan. ([id number]) ?-------------------------------------------------------------------------------- ?Lower Extremity Assessment Details ?Patient Name: Jonathan Mcmillan, Jonathan Mcmillan. ?Date of Service: 09/07/2021 11:00 AM ?Medical Record Number: [id number] ?Patient Account Number: [id number] ?Date of Birth/Sex: 03/27/1996 (26 y.o. M) ?Treating RN: Carlene Coria ?Primary Care Kerington Hildebrant: Denton Lank Other Clinician: ?Referring Zerek Litsey: Denton Lank ?Treating Mohanad Carsten/Extender: Ricard Dillon ?Weeks in Treatment: 7 ?Edema Assessment ?Assessed: [Left: No] [Right: No] ?Edema: [Left: N] [Right: o] ?Vascular Assessment ?Pulses: ?Dorsalis Pedis ?Palpable: [Left:Yes] ?Electronic Signature(s) ?Signed: 09/08/2021 1:52:28 PM By: Carlene Coria RN ?Entered By: Carlene Coria on 09/07/2021 11:23:03 ?Jonathan Mcmillan, Jonathan Mcmillan. ([id number]) ?-------------------------------------------------------------------------------- ?Multi Wound Chart Details ?Patient Name: Jonathan Mcmillan, Jonathan Mcmillan. ?Date of Service: 09/07/2021 11:00 AM ?Medical Record Number: [id number] ?Patient Account Number: [id number] ?Date of Birth/Sex: 10-08-1995 (26 y.o. M) ?Treating RN: Carlene Coria ?Primary Care Syed Zukas: Denton Lank Other Clinician: ?Referring Edy Belt: Denton Lank ?Treating Hoy Fallert/Extender: Ricard Dillon ?Weeks in Treatment: 7 ?Vital Signs ?Height(in): 68 ?Pulse(bpm): 97 ?Weight(lbs): 146 ?Blood  Pressure(mmHg): 160/106 ?Body Mass Index(BMI): 22.2 ?Temperature(??F): 98.1 ?Respiratory Rate(breaths/min): 18 ?Photos: [N/A:N/A] ?Wound Location: Sacrum Left, Lateral Foot N/A ?Wounding Event: Gradually Appeared G

## 2021-09-08 NOTE — Progress Notes (Signed)
Jonathan Mcmillan, Jonathan Mcmillan ([id number]) ?Visit Report for 09/07/2021 ?HPI Details ?Patient Name: Jonathan Mcmillan, PERZ. ?Date of Service: 09/07/2021 11:00 AM ?Medical Record Number: [id number] ?Patient Account Number: [id number] ?Date of Birth/Sex: March 24, 1996 (26 y.o. M) ?Treating RN: Carlene Coria ?Primary Care Provider: Denton Lank Other Clinician: ?Referring Provider: Denton Lank ?Treating Provider/Extender: Ricard Dillon ?Weeks in Treatment: 7 ?History of Present Illness ?HPI Description: 07/14/2021 patient presents today for initial inspection here in the clinic concerning an issue that he has been having with ?wounds over the left lateral foot, right knee, and the largest of these is the sacral region where there is significant necrotic tissue noted in the ?base of this wound. Subsequently the patient is referred to [place] from what appears to be primary care in order for further management of his ?wounds. He also sees physical medicine and rehabilitation. I did receive a lot of the notes from there as far as what is been going on with regard ?to this gentleman. ?Patient is a 26 year old male who had an anoxic encephalopathy, end-stage renal disease on hemodialysis, he has been disabled with generalized ?weakness and subsequently developed a sacral decubitus ulcer which apparently was present before he went into the hospital according to what is ?not tells me but worsened during the time in the ICU simply because he was unable to be turned with all the equipment that he was hooked up to. ?Again it sounds like they did what they could to try to keep this under control but nonetheless due to the severity of his illness it sounds as if he is ?lucky to be alive to be perfectly honest. Subsequently the patient also does have diabetes mellitus, hypertension, nicotine dependence, he did go ?to the emergency department on 07/05/2021 due to a "abscess" in the gluteal region. The family member had seen this and wanted him to be ?checked out.  During that time medical staff wanted to admit the patient but he refused. On 07/06/2021 the patient was subsequently discharged ?from the hospital [facility] and apparently they were trying to get in touch according to the notes with his aunt who is the legal ?guardian but were not able to. That was around 12:37 AM. Nonetheless in the interim since they have been basically at home using Santyl which ?was the recommendation from the hospital. This has been on all locations. Dry dressings have been applied following. Unfortunately this just does ?not seem to be doing quite enough especially for the sacral area which is a significant wound to be honest. We do not have any sign of an x-ray ?that was done of the sacral area nor that they know of anything in particular that was done at the hospital. With all that being said I think an ?x-ray would be a good idea based on what I am seeing currently this does appear to be quite significant from a wound care perspective. ?3/14; patient presents for follow-up. He has no issues or complaints today. He has been using Dakin's wet-to-dry dressings to the wound beds. He ?denies signs of infection. He reports Currently taking Augmentin. ?08/08/2021 upon evaluation today patient actually appears to be doing better in regard to his wounds in the sacral region as well as the knee both ?of which are showing signs of excellent improvement. In regard to his foot I am not certain exactly what his blood flow is at this point. That ?something but I am going to spend a little bit of time looking into the chart regarding.  Fortunately I do not see any signs of infection but I do ?believe he is probably can need some sharp debridement here but I do not want to perform this until we can confirm good blood flow. I know we ?were able to do this first visit because he was in the bed in the room where we can get him in position. We also were not able to do at last visit as ?we did not end  up actually seeing him due to some incontinence issues. Nonetheless I think that we need to plan for ABIs at the next visit. ?08-17-2021 upon evaluation today patient appears to be doing well with regard to his sacral wound as well as the knee the knee is completely ?healed. In regard to the foot he does have need for sharp debridement today. I am going to do that he is noncompressible but nonetheless has ?good pulses and good capillary refill there is no evidence of compromise here although he does have a lot of necrotic tissue we need to help clean ?away here. ?08-24-2021 upon evaluation today patient's sacral region is actually doing quite well. Unfortunately the left lateral foot area is not doing nearly as ?well there is a lot of necrotic tissue here including necrotic muscle which I would have to attempt to remove today. Overall I do feel like that the ?patient is showing some signs of improvement were still holding as far as his insurance is concerned and the MRI being scheduled apparently we ?do not have documentation of insurance that will cover for the MRI and therefore they are not scheduling it at this point. Nonetheless his aunt ?does need to figure this out as soon as possible as we really do need to look into getting this MRI done ASAP with regard to the left foot. ?08-31-2021 upon evaluation today patient appears to be doing better in regard to his foot ulcer though he still has a significant amount of necrotic ?tissue. Apparently during the conversation today it became apparent that he has not even taken the Augmentin I previously prescribed for him. ?Again his x-ray did show evidence of potential for osteomyelitis but we had ordered an MRI but at the same time he was unable to get this as ?apparently he does not have insurance that covers anything that we are actually investigating and doing at this point. It was going to be close to ?$700 and it appears that he and the family have decided not to do  this. Nonetheless he is going to need antibiotic treatment and really for at least ?6 weeks based on what we are seeing. It looks like Omnicef should be appropriate based on the culture results from last week he is also going ?require some debridement today ?4/27; large wound on the left lateral foot. Undermining but about the same in terms of measurements as last week. According to our intake nurse ?condition of the wound bed looks better. Similarly the area on the sacrum seems to be filling and inferiorly although there is still under mining at ?12:00. Using Dakin's wet-to-dry ?Electronic Signature(s) ?Signed: 09/07/2021 4:18:52 PM By: Linton Ham MD ?Entered By: Linton Ham on 09/07/2021 11:39:56 ?LONN, IM. ([id number]) ?-------------------------------------------------------------------------------- ?Physical Exam Details ?Patient Name: Jonathan Mcmillan, Jonathan Mcmillan. ?Date of Service: 09/07/2021 11:00 AM ?Medical Record Number: [id number] ?Patient Account Number: [id number] ?Date of Birth/Sex: 06-17-1995 (26 y.o. M) ?Treating RN: Carlene Coria ?Primary Care Provider: Denton Lank Other Clinician: ?Referring Provider: Denton Lank ?Treating Provider/Extender: Ricard Dillon ?Weeks in  Treatment: 7 ?Constitutional ?Patient is hypertensive.. Pulse regular and within target range for patient.[name] Respirations regular, non-labored and within target range.[name] ?Temperature is normal and within the target range for the patient.[name] appears in no distress. ?Notes ?PatientWound exam; no debridement was required in either area. In general she looks healthy. On the sacrum there is still a small tunnel at ?12:00 but according to our intake nurse starting to feel and inferiorly. ?The area on the left lateral foot again look cleaner no debridement was required. No evidence of surrounding infection. ?Electronic Signature(s) ?Signed: 09/07/2021 4:18:52 PM By: Linton Ham MD ?Entered By: Linton Ham on 09/07/2021 11:40:52 ?Jonathan Mcmillan, Jonathan Mcmillan. ([id number]) ?-------------------------------------------------------------------------------- ?Physician Orders Details ?Patient Name: Jonathan Mcmillan, Jonathan Mcmillan. ?Date of Service: 09/07/2021 11:00 AM ?

## 2021-09-15 ENCOUNTER — Encounter (INDEPENDENT_AMBULATORY_CARE_PROVIDER_SITE_OTHER): Payer: Self-pay | Admitting: Nurse Practitioner

## 2021-09-15 ENCOUNTER — Ambulatory Visit (INDEPENDENT_AMBULATORY_CARE_PROVIDER_SITE_OTHER): Payer: Self-pay

## 2021-09-15 ENCOUNTER — Ambulatory Visit (INDEPENDENT_AMBULATORY_CARE_PROVIDER_SITE_OTHER): Payer: Self-pay | Admitting: Nurse Practitioner

## 2021-09-15 VITALS — BP 133/89 | HR 98 | Resp 16 | Ht 69.0 in | Wt 131.0 lb

## 2021-09-15 DIAGNOSIS — N186 End stage renal disease: Secondary | ICD-10-CM

## 2021-09-15 DIAGNOSIS — I10 Essential (primary) hypertension: Secondary | ICD-10-CM

## 2021-09-15 DIAGNOSIS — Z992 Dependence on renal dialysis: Secondary | ICD-10-CM

## 2021-09-15 DIAGNOSIS — E108 Type 1 diabetes mellitus with unspecified complications: Secondary | ICD-10-CM

## 2021-09-18 ENCOUNTER — Ambulatory Visit: Payer: Self-pay | Admitting: Internal Medicine

## 2021-09-20 ENCOUNTER — Telehealth (INDEPENDENT_AMBULATORY_CARE_PROVIDER_SITE_OTHER): Payer: Self-pay

## 2021-09-20 ENCOUNTER — Encounter: Payer: Medicare Other | Attending: Internal Medicine | Admitting: Internal Medicine

## 2021-09-20 DIAGNOSIS — L89894 Pressure ulcer of other site, stage 4: Secondary | ICD-10-CM | POA: Diagnosis not present

## 2021-09-20 DIAGNOSIS — G931 Anoxic brain damage, not elsewhere classified: Secondary | ICD-10-CM | POA: Diagnosis not present

## 2021-09-20 DIAGNOSIS — L89154 Pressure ulcer of sacral region, stage 4: Secondary | ICD-10-CM | POA: Diagnosis present

## 2021-09-20 DIAGNOSIS — Z992 Dependence on renal dialysis: Secondary | ICD-10-CM | POA: Insufficient documentation

## 2021-09-20 DIAGNOSIS — N186 End stage renal disease: Secondary | ICD-10-CM | POA: Diagnosis not present

## 2021-09-20 DIAGNOSIS — F17200 Nicotine dependence, unspecified, uncomplicated: Secondary | ICD-10-CM | POA: Diagnosis not present

## 2021-09-20 DIAGNOSIS — I120 Hypertensive chronic kidney disease with stage 5 chronic kidney disease or end stage renal disease: Secondary | ICD-10-CM | POA: Insufficient documentation

## 2021-09-20 DIAGNOSIS — E1122 Type 2 diabetes mellitus with diabetic chronic kidney disease: Secondary | ICD-10-CM | POA: Diagnosis not present

## 2021-09-20 DIAGNOSIS — I2510 Atherosclerotic heart disease of native coronary artery without angina pectoris: Secondary | ICD-10-CM | POA: Diagnosis not present

## 2021-09-20 NOTE — Telephone Encounter (Signed)
Spoke with the patient's legal guardian and he is scheduled with Dr. Lucky Cowboy for a left brachial axillary graft on 09/28/21 at [facility]. Pre-op phone call is on 09/25/21 between 8-1 pm. Pre-surgical instructions were discussed and will be mailed. ?

## 2021-09-20 NOTE — Progress Notes (Signed)
MEDHANSH, BRINKMEIER ([id number]) ?Visit Report for 09/20/2021 ?Arrival Information Details ?Patient Name: Jonathan, Mcmillan. ?Date of Service: 09/20/2021 3:00 PM ?Medical Record Number: [id number] ?Patient Account Number: [id number] ?Date of Birth/Sex: [birth year] (26 y.o. M) ?Treating RN: Levora Dredge ?Primary Care Masiyah Engen: Denton Lank Other Clinician: ?Referring Bethel Gaglio: Denton Lank ?Treating Jarae Panas/Extender: Kalman Shan ?Weeks in Treatment: 9 ?Visit Information History Since Last Visit ?Added or deleted any medications: No ?Patient Arrived: Ambulatory ?Any new allergies or adverse reactions: No ?Arrival Time: 15:15 ?Had a fall or experienced change in No ?Accompanied By: self ?activities of daily living that may affect ?Transfer Assistance: None ?risk of falls: ?Patient Identification Verified: Yes ?Hospitalized since last visit: No ?Secondary Verification Process Completed: Yes ?Has Dressing in Place as Prescribed: Yes ?Patient Requires Transmission-Based No ?Pain Present Now: No ?Precautions: ?Patient Has Alerts: Yes ?Patient Alerts: Type 2 diabetic ?NON ?COMPRESSABLE ?Electronic Signature(s) ?Signed: 09/20/2021 4:11:25 PM By: Levora Dredge ?Entered By: Levora Dredge on 09/20/2021 15:17:13 ?Jonathan, Mcmillan. ([id number]) ?-------------------------------------------------------------------------------- ?Clinic Level of Care Assessment Details ?Patient Name: Jonathan, Mcmillan. ?Date of Service: 09/20/2021 3:00 PM ?Medical Record Number: [id number] ?Patient Account Number: [id number] ?Date of Birth/Sex: 30-Jan-1996 (26 y.o. M) ?Treating RN: Levora Dredge ?Primary Care Jeanean Hollett: Denton Lank Other Clinician: ?Referring Momodou Consiglio: Denton Lank ?Treating Haili Donofrio/Extender: Kalman Shan ?Weeks in Treatment: 9 ?Clinic Level of Care Assessment Items ?TOOL 4 Quantity Score ?[]  - Use when only an EandM is performed on FOLLOW-UP visit 0 ?ASSESSMENTS - Nursing Assessment / Reassessment ?X - Reassessment of  Co-morbidities (includes updates in patient status) 1 10 ?X- 1 5 ?Reassessment of Adherence to Treatment Plan ?ASSESSMENTS - Wound and Skin Assessment / Reassessment ?[]  - Simple Wound Assessment / Reassessment - one wound 0 ?X- 2 5 ?Complex Wound Assessment / Reassessment - multiple wounds ?[]  - 0 ?Dermatologic / Skin Assessment (not related to wound area) ?ASSESSMENTS - Focused Assessment ?[]  - Circumferential Edema Measurements - multi extremities 0 ?[]  - 0 ?Nutritional Assessment / Counseling / Intervention ?[]  - 0 ?Lower Extremity Assessment (monofilament, tuning fork, pulses) ?[]  - 0 ?Peripheral Arterial Disease Assessment (using hand held doppler) ?ASSESSMENTS - Ostomy and/or Continence Assessment and Care ?[]  - Incontinence Assessment and Management 0 ?[]  - 0 ?Ostomy Care Assessment and Management (repouching, etc.) ?PROCESS - Coordination of Care ?X - Simple Patient / Family Education for ongoing care 1 15 ?[]  - 0 ?Complex (extensive) Patient / Family Education for ongoing care ?[]  - 0 ?Staff obtains Consents, Records, Test Results / Process Orders ?[]  - 0 ?Staff telephones HHA, Nursing Homes / Clarify orders / etc ?[]  - 0 ?Routine Transfer to another Facility (non-emergent condition) ?[]  - 0 ?Routine Hospital Admission (non-emergent condition) ?[]  - 0 ?New Admissions / Biomedical engineer / Ordering NPWT, Apligraf, etc. ?[]  - 0 ?Emergency Hospital Admission (emergent condition) ?X- 1 10 ?Simple Discharge Coordination ?[]  - 0 ?Complex (extensive) Discharge Coordination ?PROCESS - Special Needs ?[]  - Pediatric / Minor Patient Management 0 ?[]  - 0 ?Isolation Patient Management ?[]  - 0 ?Hearing / Language / Visual special needs ?[]  - 0 ?Assessment of Community assistance (transportation, D/C planning, etc.) ?[]  - 0 ?Additional assistance / Altered mentation ?[]  - 0 ?Support Surface(s) Assessment (bed, cushion, seat, etc.) ?INTERVENTIONS - Wound Cleansing / Measurement ?Jonathan, Mcmillan ([id number]) ?[]  -  0 ?Simple Wound Cleansing - one wound ?X- 2 5 ?Complex Wound Cleansing - multiple wounds ?X- 1 5 ?Wound Imaging (photographs - any number of wounds) ?[]  - 0 ?Wound Tracing (instead of photographs) ?[]  -  0 ?Simple Wound Measurement - one wound ?X- 2 5 ?Complex Wound Measurement - multiple wounds ?INTERVENTIONS - Wound Dressings ?X - Small Wound Dressing one or multiple wounds 2 10 ?[]  - 0 ?Medium Wound Dressing one or multiple wounds ?[]  - 0 ?Large Wound Dressing one or multiple wounds ?[]  - 0 ?Application of Medications - topical ?[]  - 0 ?Application of Medications - injection ?INTERVENTIONS - Miscellaneous ?[]  - External ear exam 0 ?[]  - 0 ?Specimen Collection (cultures, biopsies, blood, body fluids, etc.) ?[]  - 0 ?Specimen(s) / Culture(s) sent or taken to Lab for analysis ?[]  - 0 ?Patient Transfer (multiple staff / Civil Service fast streamer / Similar devices) ?[]  - 0 ?Simple Staple / Suture removal (25 or less) ?[]  - 0 ?Complex Staple / Suture removal (26 or more) ?[]  - 0 ?Hypo / Hyperglycemic Management (close monitor of Blood Glucose) ?[]  - 0 ?Ankle / Brachial Index (ABI) - do not check if billed separately ?X- 1 5 ?Vital Signs ?Has the patient been seen at the hospital within the last three years: Yes ?Total Score: 100 ?Level Of Care: New/Established - ?Level 3 ?Electronic Signature(s) ?Signed: 09/20/2021 4:11:25 PM By: Levora Dredge ?Entered By: Levora Dredge on 09/20/2021 15:47:15 ?Jonathan, Mcmillan. ([id number]) ?-------------------------------------------------------------------------------- ?Encounter Discharge Information Details ?Patient Name: Jonathan, Mcmillan. ?Date of Service: 09/20/2021 3:00 PM ?Medical Record Number: [id number] ?Patient Account Number: [id number] ?Date of Birth/Sex: [birth year] (26 y.o. M) ?Treating RN: Levora Dredge ?Primary Care Makail Watling: Denton Lank Other Clinician: ?Referring Joniece Smotherman: Denton Lank ?Treating Veverly Larimer/Extender: Kalman Shan ?Weeks in Treatment: 9 ?Encounter Discharge  Information Items ?Discharge Condition: Stable ?Ambulatory Status: Ambulatory ?Discharge Destination: Home ?Transportation: Private Auto ?Accompanied By: self ?Schedule Follow-up Appointment: Yes ?Clinical Summary of Care: Patient Declined ?Electronic Signature(s) ?Signed: 09/20/2021 4:11:25 PM By: Levora Dredge ?Entered By: Levora Dredge on 09/20/2021 15:48:01 ?Jonathan, Mcmillan. ([id number]) ?-------------------------------------------------------------------------------- ?Lower Extremity Assessment Details ?Patient Name: Jonathan, Mcmillan. ?Date of Service: 09/20/2021 3:00 PM ?Medical Record Number: [id number] ?Patient Account Number: [id number] ?Date of Birth/Sex: 1995/08/22 (26 y.o. M) ?Treating RN: Levora Dredge ?Primary Care Ashawna Hanback: Denton Lank Other Clinician: ?Referring Rosemaria Inabinet: Denton Lank ?Treating Melisha Eggleton/Extender: Kalman Shan ?Weeks in Treatment: 9 ?Edema Assessment ?Assessed: [Left: No] [Right: No] ?Edema: [Left: Ye] [Right: s] ?Vascular Assessment ?Pulses: ?Dorsalis Pedis ?Palpable: [Left:Yes] ?Electronic Signature(s) ?Signed: 09/20/2021 4:11:25 PM By: Levora Dredge ?Entered By: Levora Dredge on 09/20/2021 15:25:34 ?Jonathan, Mcmillan. ([id number]) ?-------------------------------------------------------------------------------- ?Multi-Disciplinary Care Plan Details ?Patient Name: Jonathan, Mcmillan. ?Date of Service: 09/20/2021 3:00 PM ?Medical Record Number: [id number] ?Patient Account Number: [id number] ?Date of Birth/Sex: October 29, 1995 (26 y.o. M) ?Treating RN: Levora Dredge ?Primary Care Mycala Warshawsky: Denton Lank Other Clinician: ?Referring Regan Llorente: Denton Lank ?Treating Sherice Ijames/Extender: Kalman Shan ?Weeks in Treatment: 9 ?Active Inactive ?Necrotic Tissue ?Nursing Diagnoses: ?Impaired tissue integrity related to necrotic/devitalized tissue ?Knowledge deficit related to management of necrotic/devitalized tissue ?Goals: ?Necrotic/devitalized tissue will be minimized in the wound bed ?Date  Initiated: 08/31/2021 ?Target Resolution Date: 08/31/2021 ?Goal Status: Active ?Patient/caregiver will verbalize understanding of reason and process for debridement of necrotic tissue ?Date Initiated: 08/31/2021 ?Target Reso

## 2021-09-20 NOTE — Progress Notes (Addendum)
Jonathan Mcmillan ([id number]) ?Visit Report for 09/20/2021 ?Chief Complaint Document Details ?Patient Name: Jonathan Mcmillan, Jonathan Mcmillan. ?Date of Service: 09/20/2021 3:00 PM ?Medical Record Number: [id number] ?Patient Account Number: [id number] ?Date of Birth/Sex: 12-19-1995 (26 y.o. M) ?Treating RN: Levora Dredge ?Primary Care Provider: Denton Lank Other Clinician: ?Referring Provider: Denton Lank ?Treating Provider/Extender: Kalman Shan ?Weeks in Treatment: 9 ?Information Obtained from: Patient ?Chief Complaint ?Sacral, left lateral foot, and right knee pressure ulcers ?Electronic Signature(s) ?Signed: 09/20/2021 3:46:08 PM By: Kalman Shan DO ?Entered By: Kalman Shan on 09/20/2021 15:42:50 ?Jonathan Mcmillan, Jonathan Mcmillan. ([id number]) ?-------------------------------------------------------------------------------- ?HPI Details ?Patient Name: Jonathan Mcmillan, Jonathan Mcmillan. ?Date of Service: 09/20/2021 3:00 PM ?Medical Record Number: [id number] ?Patient Account Number: [id number] ?Date of Birth/Sex: Aug 14, 1995 (26 y.o. M) ?Treating RN: Levora Dredge ?Primary Care Provider: Denton Lank Other Clinician: ?Referring Provider: Denton Lank ?Treating Provider/Extender: Kalman Shan ?Weeks in Treatment: 9 ?History of Present Illness ?HPI Description: 07/14/2021 patient presents today for initial inspection here in the clinic concerning an issue that he has been having with ?wounds over the left lateral foot, right knee, and the largest of these is the sacral region where there is significant necrotic tissue noted in the ?base of this wound. Subsequently the patient is referred to [place] from what appears to be primary care in order for further management of his ?wounds. He also sees physical medicine and rehabilitation. I did receive a lot of the notes from there as far as what is been going on with regard ?to this gentleman. ?Patient is a 26 year old male who had an anoxic encephalopathy, end-stage renal disease on hemodialysis, he has been disabled  with generalized ?weakness and subsequently developed a sacral decubitus ulcer which apparently was present before he went into the hospital according to what is ?not tells me but worsened during the time in the ICU simply because he was unable to be turned with all the equipment that he was hooked up to. ?Again it sounds like they did what they could to try to keep this under control but nonetheless due to the severity of his illness it sounds as if he is ?lucky to be alive to be perfectly honest. Subsequently the patient also does have diabetes mellitus, hypertension, nicotine dependence, he did go ?to the emergency department on 07/05/2021 due to a "abscess" in the gluteal region. The family member had seen this and wanted him to be ?checked out. During that time medical staff wanted to admit the patient but he refused. On 07/06/2021 the patient was subsequently discharged ?from the hospital [facility] and apparently they were trying to get in touch according to the notes with his aunt who is the legal ?guardian but were not able to. That was around 12:37 AM. Nonetheless in the interim since they have been basically at home using Santyl which ?was the recommendation from the hospital. This has been on all locations. Dry dressings have been applied following. Unfortunately this just does ?not seem to be doing quite enough especially for the sacral area which is a significant wound to be honest. We do not have any sign of an x-ray ?that was done of the sacral area nor that they know of anything in particular that was done at the hospital. With all that being said I think an ?x-ray would be a good idea based on what I am seeing currently this does appear to be quite significant from a wound care perspective. ?3/14; patient presents for follow-up. He has no issues or complaints today. He has  been using Dakin's wet-to-dry dressings to the wound beds. He ?denies signs of infection. He reports Currently taking  Augmentin. ?08/08/2021 upon evaluation today patient actually appears to be doing better in regard to his wounds in the sacral region as well as the knee both ?of which are showing signs of excellent improvement. In regard to his foot I am not certain exactly what his blood flow is at this point. That ?something but I am going to spend a little bit of time looking into the chart regarding. Fortunately I do not see any signs of infection but I do ?believe he is probably can need some sharp debridement here but I do not want to perform this until we can confirm good blood flow. I know we ?were able to do this first visit because he was in the bed in the room where we can get him in position. We also were not able to do at last visit as ?we did not end up actually seeing him due to some incontinence issues. Nonetheless I think that we need to plan for ABIs at the next visit. ?08-17-2021 upon evaluation today patient appears to be doing well with regard to his sacral wound as well as the knee the knee is completely ?healed. In regard to the foot he does have need for sharp debridement today. I am going to do that he is noncompressible but nonetheless has ?good pulses and good capillary refill there is no evidence of compromise here although he does have a lot of necrotic tissue we need to help clean ?away here. ?08-24-2021 upon evaluation today patient's sacral region is actually doing quite well. Unfortunately the left lateral foot area is not doing nearly as ?well there is a lot of necrotic tissue here including necrotic muscle which I would have to attempt to remove today. Overall I do feel like that the ?patient is showing some signs of improvement were still holding as far as his insurance is concerned and the MRI being scheduled apparently we ?do not have documentation of insurance that will cover for the MRI and therefore they are not scheduling it at this point. Nonetheless his aunt ?does need to figure this out as  soon as possible as we really do need to look into getting this MRI done ASAP with regard to the left foot. ?08-31-2021 upon evaluation today patient appears to be doing better in regard to his foot ulcer though he still has a significant amount of necrotic ?tissue. Apparently during the conversation today it became apparent that he has not even taken the Augmentin I previously prescribed for him. ?Again his x-ray did show evidence of potential for osteomyelitis but we had ordered an MRI but at the same time he was unable to get this as ?apparently he does not have insurance that covers anything that we are actually investigating and doing at this point. It was going to be close to ?$700 and it appears that he and the family have decided not to do this. Nonetheless he is going to need antibiotic treatment and really for at least ?6 weeks based on what we are seeing. It looks like Omnicef should be appropriate based on the culture results from last week he is also going ?require some debridement today ?4/27; large wound on the left lateral foot. Undermining but about the same in terms of measurements as last week. According to our intake nurse ?condition of the wound bed looks better. Similarly the area on the sacrum seems  to be filling and inferiorly although there is still under mining at ?12:00. Using Dakin's wet-to-dry ?5/10; patient presents for follow-up. He has no issues or complaints today. He states he has some days left to finish his antibiotic course. He has ?been using Dakin's wet-to-dry dressings. He denies signs of infection. ?Electronic Signature(s) ?Signed: 09/20/2021 3:46:08 PM By: Kalman Shan DO ?Entered By: Kalman Shan on 09/20/2021 15:43:35 ?Jonathan Mcmillan, Jonathan Mcmillan. ([id number]) ?-------------------------------------------------------------------------------- ?Physical Exam Details ?Patient Name: Jonathan Mcmillan, Jonathan Mcmillan. ?Date of Service: 09/20/2021 3:00 PM ?Medical Record Number: [id number] ?Patient  Account Number: [id number] ?Date of Birth/Sex: 07/14/1995 (26 y.o. M) ?Treating RN: Levora Dredge ?Primary Care Provider: Denton Lank Other Clinician: ?Referring Provider: Denton Lank ?Treating Provide

## 2021-09-24 ENCOUNTER — Encounter (INDEPENDENT_AMBULATORY_CARE_PROVIDER_SITE_OTHER): Payer: Self-pay | Admitting: Nurse Practitioner

## 2021-09-24 NOTE — H&P (View-Only) (Signed)
? ?Subjective:  ? ? Patient ID: Jonathan Mcmillan, male    DOB: [birth year], 26 y.o.   MRN: [id number] ?Chief Complaint  ?Patient presents with  ? Establish Care  ?  Referred by Dr Juleen China  ? ? ?The patient is seen for follow up evaluation of dialysis access.  The patient recently started on dialysis and had their initial tunneled catheter placed.  There have not been any previous accesses.   ? ?The patient is right handed.  ? ?Current access is via a catheter which is functioning without issue.  There have not been any episodes of catheter infection.  The patient denies fever and chills while on dialysis.  No tenderness or drainage at the exit site. ? ?No recent shortening of the patient's walking distance or new symptoms consistent with claudication.  No history of rest pain symptoms. No new ulcers or wounds of the lower extremities have occurred. ? ?The patient denies amaurosis fugax or recent TIA symptoms. There are no recent neurological changes noted. ?There is no history of DVT, PE or superficial thrombophlebitis. ?No recent episodes of angina or shortness of breath documented.   ? ?Noninvasive studies show in diameter is adequate for access for a left brachial axillary AV graft. ? ? ?Review of Systems  ?All other systems reviewed and are negative. ? ?   ?Objective:  ? Physical Exam ?Vitals reviewed.  ?HENT:  ?   Head: Normocephalic.  ?Cardiovascular:  ?   Rate and Rhythm: Normal rate.  ?   Pulses:     ?     Radial pulses are 2+ on the right side and 2+ on the left side.  ?Pulmonary:  ?   Effort: Pulmonary effort is normal.  ?Skin: ?   General: Skin is warm and dry.  ?Neurological:  ?   Mental Status: He is alert and oriented to person, place, and time.  ?Psychiatric:     ?   Mood and Affect: Mood normal.     ?   Behavior: Behavior normal.     ?   Thought Content: Thought content normal.     ?   Judgment: Judgment normal.  ? ? ?BP 133/89 (BP Location: Left Arm)   Pulse 98   Resp 16   Ht 5' 9" (1.753 m)   Wt  131 lb (59.4 kg)   BMI 19.35 kg/m?  ? ?Past Medical History:  ?Diagnosis Date  ? Cannabinoid hyperemesis syndrome   ? CKD (chronic kidney disease), stage IV ([place])   ? Diabetes 1.5, managed as type 1 ([place])   ? DKA (diabetic ketoacidoses) 05/21/2016  ? Gastroparesis   ? HTN (hypertension)   ? Nicotine dependence   ? Perirectal abscess 06/16/2017  ? Right arm cellulitis 01/26/2018  ? ? ?Social History  ? ?Socioeconomic History  ? Marital status: Single  ?  Spouse name: Not on file  ? Number of children: Not on file  ? Years of education: Not on file  ? Highest education level: Not on file  ?Occupational History  ? Occupation: unemployed  ?Tobacco Use  ? Smoking status: Every Day  ?  Packs/day: 0.50  ?  Types: Cigarettes  ? Smokeless tobacco: Never  ?Vaping Use  ? Vaping Use: Never used  ?Substance and Sexual Activity  ? Alcohol use: No  ? Drug use: Yes  ?  Types: Marijuana  ? Sexual activity: Not on file  ?Other Topics Concern  ? Not on file  ?Social History Narrative  ?  Not on file  ? ?Social Determinants of Health  ? ?Financial Resource Strain: Not on file  ?Food Insecurity: Not on file  ?Transportation Needs: Not on file  ?Physical Activity: Not on file  ?Stress: Not on file  ?Social Connections: Not on file  ?Intimate Partner Violence: Not on file  ? ? ?Past Surgical History:  ?Procedure Laterality Date  ? COLONOSCOPY WITH PROPOFOL N/A 06/21/2021  ? Procedure: COLONOSCOPY WITH PROPOFOL;  Surgeon: Lin Landsman, MD;  Location: [facility] ENDOSCOPY;  Service: Gastroenterology;  Laterality: N/A;  ? DIALYSIS/PERMA CATHETER INSERTION N/A 06/15/2021  ? Procedure: DIALYSIS/PERMA CATHETER INSERTION;  Surgeon: Algernon Huxley, MD;  Location: [facility] [facility];  Service: Cardiovascular;  Laterality: N/A;  ? DIALYSIS/PERMA CATHETER INSERTION N/A 06/19/2021  ? Procedure: DIALYSIS/PERMA CATHETER INSERTION;  Surgeon: Algernon Huxley, MD;  Location: [facility];  Service: Cardiovascular;  Laterality: N/A;  ? INCISION AND DRAINAGE  PERIRECTAL ABSCESS N/A 06/16/2017  ? Procedure: IRRIGATION AND DEBRIDEMENT PERIRECTAL ABSCESS;  Surgeon: Florene Glen, MD;  Location: [facility] ORS;  Service: General;  Laterality: N/A;  ? none    ? ? ?Family History  ?Problem Relation Age of Onset  ? Diabetes Mother   ? ? ?No Known Allergies ? ? ?  Latest Ref Rng & Units 07/05/2021  ?  1:27 PM 06/28/2021  ?  1:09 PM 06/26/2021  ?  6:23 AM  ?CBC  ?WBC 4.0 - 10.5 K/uL 12.1   13.6   12.6    ?Hemoglobin 13.0 - 17.0 g/dL 6.6   8.1   8.5    ?Hematocrit 39.0 - 52.0 % 21.7   25.9   25.9    ?Platelets 150 - 400 K/uL 207   86   64    ? ? ? ? ?CMP  ?   ?Component Value Date/Time  ? NA 138 07/05/2021 1327  ? NA 133 (L) 08/05/2013 1326  ? K 3.7 07/05/2021 1327  ? K 3.8 08/05/2013 1326  ? CL 101 07/05/2021 1327  ? CL 99 08/05/2013 1326  ? CO2 30 07/05/2021 1327  ? CO2 28 08/05/2013 1326  ? GLUCOSE 121 (H) 07/05/2021 1327  ? GLUCOSE 301 (H) 08/05/2013 1326  ? BUN 12 07/05/2021 1327  ? BUN 5 (L) 08/05/2013 1326  ? CREATININE 2.14 (H) 07/05/2021 1327  ? CREATININE 0.62 08/05/2013 1326  ? CALCIUM 7.0 (L) 07/05/2021 1327  ? CALCIUM 8.0 06/12/2020 0515  ? PROT 6.0 (L) 07/05/2021 1327  ? PROT 7.8 08/05/2013 1326  ? ALBUMIN 1.6 (L) 07/05/2021 1327  ? ALBUMIN 2.9 (L) 08/05/2013 1326  ? AST 13 (L) 07/05/2021 1327  ? AST 10 08/05/2013 1326  ? ALT 10 07/05/2021 1327  ? ALT 10 (L) 08/05/2013 1326  ? ALKPHOS 115 07/05/2021 1327  ? ALKPHOS 173 (H) 08/05/2013 1326  ? BILITOT 0.9 07/05/2021 1327  ? BILITOT 1.4 (H) 08/05/2013 1326  ? GFRNONAA 42 (L) 07/05/2021 1327  ? GFRNONAA >60 08/05/2013 1326  ? GFRAA 51 (L) 01/02/2020 1718  ? GFRAA >60 08/05/2013 1326  ? ? ? ?No results found. ? ?   ?Assessment & Plan:  ? ?1. ESRD on dialysis [facility]) ?Recommend: ? ?At this time the patient does not have appropriate extremity access for dialysis ? ?Patient should have a left brachial axillary AV graft created. ? ?The risks, benefits and alternative therapies were reviewed in detail with the patient.  All questions  were answered.  The patient agrees to proceed with surgery.  ? ?  The patient will follow up with me in the office after the surgery.  ? ?2. 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 ? ?Hgb A1C to be monitored as already arranged by primary service  ? ?3. Primary hypertension ?Continue antihypertensive medications as already ordered, these medications have been reviewed and there are no changes at this time.  ? ? ?Current Outpatient Medications on File Prior to Visit  ?Medication Sig Dispense Refill  ? Accu-Chek Softclix Lancets lancets Use aas directed up to 4 times per day. 100 each 0  ? acetaminophen (TYLENOL) 325 MG tablet Take 1-2 tablets (325-650 mg total) by mouth every 4 (four) hours as needed for mild pain. 100 tablet 0  ? amiodarone (PACERONE) 200 MG tablet Take 1 tablet (200 mg total) by mouth daily. 30 tablet 0  ? ascorbic acid (VITAMIN C) 500 MG tablet Take 1 tablet (500 mg total) by mouth 2 (two) times daily. 30 tablet 0  ? Blood Glucose Monitoring Suppl (BLOOD GLUCOSE MONITOR SYSTEM) w/Device KIT Use as directed up to 4 times per day. 1 kit 0  ? calcitRIOL (ROCALTROL) 0.5 MCG capsule Take 1 capsule (0.5 mcg total) by mouth daily. 30 capsule 0  ? calcium carbonate (TUMS - DOSED IN MG ELEMENTAL CALCIUM) 500 MG chewable tablet Chew 1 tablet (200 mg of elemental calcium total) by mouth 3 (three) times daily. 100 tablet 0  ? carvedilol (COREG) 3.125 MG tablet Take 1 tablet (3.125 mg total) by mouth 2 (two) times daily with a meal. 60 tablet 0  ? cefdinir (OMNICEF) 300 MG capsule Take by mouth.    ? cinacalcet (SENSIPAR) 30 MG tablet Take 1 tablet (30 mg total) by mouth daily with breakfast. 30 tablet 0  ? collagenase (SANTYL) ointment Apply topically daily. 15 g 0  ? Darbepoetin Alfa (ARANESP) 60 MCG/0.3ML SOSY injection Inject 0.3 mLs (60 mcg total) into the vein every  Friday with hemodialysis. 4.2 mL   ? ferrous sulfate 325 (65 FE) MG tablet Take 1 tablet (325 mg total) by mouth daily with breakfast. 30 tablet 0  ? glucose blood test strip Use as directed 100 each 0  ? hydro

## 2021-09-24 NOTE — Progress Notes (Signed)
? ?Subjective:  ? ? Patient ID: Jonathan Mcmillan, male    DOB: [birth year], 26 y.o.   MRN: [id number] ?Chief Complaint  ?Patient presents with  ? Establish Care  ?  Referred by Dr Juleen China  ? ? ?The patient is seen for follow up evaluation of dialysis access.  The patient recently started on dialysis and had their initial tunneled catheter placed.  There have not been any previous accesses.   ? ?The patient is right handed.  ? ?Current access is via a catheter which is functioning without issue.  There have not been any episodes of catheter infection.  The patient denies fever and chills while on dialysis.  No tenderness or drainage at the exit site. ? ?No recent shortening of the patient's walking distance or new symptoms consistent with claudication.  No history of rest pain symptoms. No new ulcers or wounds of the lower extremities have occurred. ? ?The patient denies amaurosis fugax or recent TIA symptoms. There are no recent neurological changes noted. ?There is no history of DVT, PE or superficial thrombophlebitis. ?No recent episodes of angina or shortness of breath documented.   ? ?Noninvasive studies show in diameter is adequate for access for a left brachial axillary AV graft. ? ? ?Review of Systems  ?All other systems reviewed and are negative. ? ?   ?Objective:  ? Physical Exam ?Vitals reviewed.  ?HENT:  ?   Head: Normocephalic.  ?Cardiovascular:  ?   Rate and Rhythm: Normal rate.  ?   Pulses:     ?     Radial pulses are 2+ on the right side and 2+ on the left side.  ?Pulmonary:  ?   Effort: Pulmonary effort is normal.  ?Skin: ?   General: Skin is warm and dry.  ?Neurological:  ?   Mental Status: He is alert and oriented to person, place, and time.  ?Psychiatric:     ?   Mood and Affect: Mood normal.     ?   Behavior: Behavior normal.     ?   Thought Content: Thought content normal.     ?   Judgment: Judgment normal.  ? ? ?BP 133/89 (BP Location: Left Arm)   Pulse 98   Resp 16   Ht 5' 9" (1.753 m)   Wt  131 lb (59.4 kg)   BMI 19.35 kg/m?  ? ?Past Medical History:  ?Diagnosis Date  ? Cannabinoid hyperemesis syndrome   ? CKD (chronic kidney disease), stage IV ([place])   ? Diabetes 1.5, managed as type 1 ([place])   ? DKA (diabetic ketoacidoses) 05/21/2016  ? Gastroparesis   ? HTN (hypertension)   ? Nicotine dependence   ? Perirectal abscess 06/16/2017  ? Right arm cellulitis 01/26/2018  ? ? ?Social History  ? ?Socioeconomic History  ? Marital status: Single  ?  Spouse name: Not on file  ? Number of children: Not on file  ? Years of education: Not on file  ? Highest education level: Not on file  ?Occupational History  ? Occupation: unemployed  ?Tobacco Use  ? Smoking status: Every Day  ?  Packs/day: 0.50  ?  Types: Cigarettes  ? Smokeless tobacco: Never  ?Vaping Use  ? Vaping Use: Never used  ?Substance and Sexual Activity  ? Alcohol use: No  ? Drug use: Yes  ?  Types: Marijuana  ? Sexual activity: Not on file  ?Other Topics Concern  ? Not on file  ?Social History Narrative  ?  Not on file  ? ?Social Determinants of Health  ? ?Financial Resource Strain: Not on file  ?Food Insecurity: Not on file  ?Transportation Needs: Not on file  ?Physical Activity: Not on file  ?Stress: Not on file  ?Social Connections: Not on file  ?Intimate Partner Violence: Not on file  ? ? ?Past Surgical History:  ?Procedure Laterality Date  ? COLONOSCOPY WITH PROPOFOL N/A 06/21/2021  ? Procedure: COLONOSCOPY WITH PROPOFOL;  Surgeon: Lin Landsman, MD;  Location: [facility] ENDOSCOPY;  Service: Gastroenterology;  Laterality: N/A;  ? DIALYSIS/PERMA CATHETER INSERTION N/A 06/15/2021  ? Procedure: DIALYSIS/PERMA CATHETER INSERTION;  Surgeon: Algernon Huxley, MD;  Location: [place] [facility];  Service: Cardiovascular;  Laterality: N/A;  ? DIALYSIS/PERMA CATHETER INSERTION N/A 06/19/2021  ? Procedure: DIALYSIS/PERMA CATHETER INSERTION;  Surgeon: Algernon Huxley, MD;  Location: [facility];  Service: Cardiovascular;  Laterality: N/A;  ? INCISION AND DRAINAGE  PERIRECTAL ABSCESS N/A 06/16/2017  ? Procedure: IRRIGATION AND DEBRIDEMENT PERIRECTAL ABSCESS;  Surgeon: Florene Glen, MD;  Location: [facility] ORS;  Service: General;  Laterality: N/A;  ? none    ? ? ?Family History  ?Problem Relation Age of Onset  ? Diabetes Mother   ? ? ?No Known Allergies ? ? ?  Latest Ref Rng & Units 07/05/2021  ?  1:27 PM 06/28/2021  ?  1:09 PM 06/26/2021  ?  6:23 AM  ?CBC  ?WBC 4.0 - 10.5 K/uL 12.1   13.6   12.6    ?Hemoglobin 13.0 - 17.0 g/dL 6.6   8.1   8.5    ?Hematocrit 39.0 - 52.0 % 21.7   25.9   25.9    ?Platelets 150 - 400 K/uL 207   86   64    ? ? ? ? ?CMP  ?   ?Component Value Date/Time  ? NA 138 07/05/2021 1327  ? NA 133 (L) 08/05/2013 1326  ? K 3.7 07/05/2021 1327  ? K 3.8 08/05/2013 1326  ? CL 101 07/05/2021 1327  ? CL 99 08/05/2013 1326  ? CO2 30 07/05/2021 1327  ? CO2 28 08/05/2013 1326  ? GLUCOSE 121 (H) 07/05/2021 1327  ? GLUCOSE 301 (H) 08/05/2013 1326  ? BUN 12 07/05/2021 1327  ? BUN 5 (L) 08/05/2013 1326  ? CREATININE 2.14 (H) 07/05/2021 1327  ? CREATININE 0.62 08/05/2013 1326  ? CALCIUM 7.0 (L) 07/05/2021 1327  ? CALCIUM 8.0 06/12/2020 0515  ? PROT 6.0 (L) 07/05/2021 1327  ? PROT 7.8 08/05/2013 1326  ? ALBUMIN 1.6 (L) 07/05/2021 1327  ? ALBUMIN 2.9 (L) 08/05/2013 1326  ? AST 13 (L) 07/05/2021 1327  ? AST 10 08/05/2013 1326  ? ALT 10 07/05/2021 1327  ? ALT 10 (L) 08/05/2013 1326  ? ALKPHOS 115 07/05/2021 1327  ? ALKPHOS 173 (H) 08/05/2013 1326  ? BILITOT 0.9 07/05/2021 1327  ? BILITOT 1.4 (H) 08/05/2013 1326  ? GFRNONAA 42 (L) 07/05/2021 1327  ? GFRNONAA >60 08/05/2013 1326  ? GFRAA 51 (L) 01/02/2020 1718  ? GFRAA >60 08/05/2013 1326  ? ? ? ?No results found. ? ?   ?Assessment & Plan:  ? ?1. ESRD on dialysis [facility]) ?Recommend: ? ?At this time the patient does not have appropriate extremity access for dialysis ? ?Patient should have a left brachial axillary AV graft created. ? ?The risks, benefits and alternative therapies were reviewed in detail with the patient.  All questions  were answered.  The patient agrees to proceed with surgery.  ? ?  The patient will follow up with me in the office after the surgery.  ? ?2. 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 ? ?Hgb A1C to be monitored as already arranged by primary service  ? ?3. Primary hypertension ?Continue antihypertensive medications as already ordered, these medications have been reviewed and there are no changes at this time.  ? ? ?Current Outpatient Medications on File Prior to Visit  ?Medication Sig Dispense Refill  ? Accu-Chek Softclix Lancets lancets Use aas directed up to 4 times per day. 100 each 0  ? acetaminophen (TYLENOL) 325 MG tablet Take 1-2 tablets (325-650 mg total) by mouth every 4 (four) hours as needed for mild pain. 100 tablet 0  ? amiodarone (PACERONE) 200 MG tablet Take 1 tablet (200 mg total) by mouth daily. 30 tablet 0  ? ascorbic acid (VITAMIN C) 500 MG tablet Take 1 tablet (500 mg total) by mouth 2 (two) times daily. 30 tablet 0  ? Blood Glucose Monitoring Suppl (BLOOD GLUCOSE MONITOR SYSTEM) w/Device KIT Use as directed up to 4 times per day. 1 kit 0  ? calcitRIOL (ROCALTROL) 0.5 MCG capsule Take 1 capsule (0.5 mcg total) by mouth daily. 30 capsule 0  ? calcium carbonate (TUMS - DOSED IN MG ELEMENTAL CALCIUM) 500 MG chewable tablet Chew 1 tablet (200 mg of elemental calcium total) by mouth 3 (three) times daily. 100 tablet 0  ? carvedilol (COREG) 3.125 MG tablet Take 1 tablet (3.125 mg total) by mouth 2 (two) times daily with a meal. 60 tablet 0  ? cefdinir (OMNICEF) 300 MG capsule Take by mouth.    ? cinacalcet (SENSIPAR) 30 MG tablet Take 1 tablet (30 mg total) by mouth daily with breakfast. 30 tablet 0  ? collagenase (SANTYL) ointment Apply topically daily. 15 g 0  ? Darbepoetin Alfa (ARANESP) 60 MCG/0.3ML SOSY injection Inject 0.3 mLs (60 mcg total) into the vein every  Friday with hemodialysis. 4.2 mL   ? ferrous sulfate 325 (65 FE) MG tablet Take 1 tablet (325 mg total) by mouth daily with breakfast. 30 tablet 0  ? glucose blood test strip Use as directed 100 each 0  ? hydro

## 2021-09-25 ENCOUNTER — Other Ambulatory Visit (INDEPENDENT_AMBULATORY_CARE_PROVIDER_SITE_OTHER): Payer: Self-pay | Admitting: Nurse Practitioner

## 2021-09-25 ENCOUNTER — Telehealth: Payer: Self-pay | Admitting: *Deleted

## 2021-09-25 ENCOUNTER — Encounter: Payer: Self-pay | Admitting: Vascular Surgery

## 2021-09-25 ENCOUNTER — Encounter
Admission: RE | Admit: 2021-09-25 | Discharge: 2021-09-25 | Disposition: A | Discharge status: Home / Self Care | Payer: MEDICAID | Source: Ambulatory Visit | Attending: Vascular Surgery | Admitting: Vascular Surgery

## 2021-09-25 VITALS — Ht 69.0 in | Wt 131.0 lb

## 2021-09-25 DIAGNOSIS — Z01812 Encounter for preprocedural laboratory examination: Secondary | ICD-10-CM

## 2021-09-25 DIAGNOSIS — N186 End stage renal disease: Secondary | ICD-10-CM

## 2021-09-25 DIAGNOSIS — Z992 Dependence on renal dialysis: Secondary | ICD-10-CM

## 2021-09-25 HISTORY — DX: Anxiety disorder, unspecified: F41.9

## 2021-09-25 HISTORY — DX: Depression, unspecified: F32.A

## 2021-09-25 MED ORDER — HEPARIN SODIUM (PORCINE) 1000 UNIT/ML DIALYSIS
2000.0000 [IU] | INTRAMUSCULAR | Status: DC | PRN
Start: 2021-09-26 — End: 2021-09-26
  Filled 2021-09-25: qty 2

## 2021-09-25 NOTE — Telephone Encounter (Signed)
? ?  Name: Jonathan Mcmillan  ?DOB: [birth year]  ?MRN: [id number] ? ?Primary Cardiologist: None ? ?Chart reviewed as part of pre-operative protocol coverage. Because of JAYSTEN ESSNER past medical history and time since last visit, he will require a follow-up in-office visit in order to better assess preoperative cardiovascular risk. ? ?Pre-op covering staff: ?- Please schedule appointment and call patient to inform them. If patient already had an upcoming appointment within acceptable timeframe, please add "pre-op clearance" to the appointment notes so provider is aware. ?- Please contact requesting surgeon's office via preferred method (i.e, phone, fax) to inform them of need for appointment prior to surgery. ? ?Lenna Sciara, NP  ?09/25/2021, 12:24 PM  ? ?

## 2021-09-25 NOTE — Telephone Encounter (Signed)
Pt has been referred to EP for new dx a-fib. Pt has appt with Dr. Quentin Ore 10/25/21 for a consult. Pt also has a follow up with primary card Dr. Charlestine Night 10/27/21. I will update the requesting office to these appts.. pt will need to be seen before he can be cleared.  ?

## 2021-09-25 NOTE — Telephone Encounter (Signed)
Request for pre-operative cardiac clearance ?Received: Today ?Karen Kitchens, NP  P Cv Div Preop Callback ?Request for pre-operative cardiac clearance:  ?   ?1. What type of surgery is being performed?  ?INSERTION OF ARTERIOVENOUS (AV) GORE-TEX GRAFT ARM (BRACHIAL AXILLARY )  ? ?2. When is this surgery scheduled?  ?09/28/2021  ?   ?3. Type of clearance being requested (medical, pharmacy, both).  ?MEDICAL  ?   ?4. Are there any medications that need to be held prior to surgery?  ?NONE  ? ?5. Practice name and name of physician performing surgery?  ?Performing surgeon: Dr. [name], MD  ?Requesting clearance: Honor Loh, FNP-C    ?   ?6. Anesthesia type (none, local, MAC, general)?  ?GENERAL  ? ?7. What is the office phone and fax number?    ?Phone: [phone]  ?Fax: [phone]  ? ?ATTENTION: Unable to create telephone message as per your standard workflow. Directed by [facility] providers to send requests for cardiac clearance to this pool for appropriate distribution to provider covering pre-operative clearances.  ? ?Honor Loh, MSN, APRN, FNP-C, CEN  ?[place]  ?Peri-operative Services Nurse Practitioner  ?Phone: [phone]  ?09/25/21 11:49 AM   ?

## 2021-09-25 NOTE — Patient Instructions (Addendum)
Your procedure is scheduled on: Thursday, May 18 ?Report to the Registration Desk on the 1st floor of the [facility]. ?To find out your arrival time, please call [phone] between 1PM - 3PM on: Wednesday, May 17 ?If your arrival time is 6:00 am, do not arrive prior to that time as the [facility] entrance doors do not open until 6:00 am. ? ?REMEMBER: ?Instructions that are not followed completely may result in serious medical risk, up to and including death; or upon the discretion of your surgeon and anesthesiologist your surgery may need to be rescheduled. ? ?Do not eat or drink after midnight the night before surgery.  ?No gum chewing, lozengers or hard candies. ? ?TAKE THESE MEDICATIONS THE MORNING OF SURGERY WITH A SIP OF WATER: ? ?Amiodarone ?Carvedilol ?Omeprazole (Prilosec) - (take one the night before and one on the morning of surgery - helps to prevent nausea after surgery.) ? ?Do not take any insulin on the morning of surgery. ? ?One week prior to surgery: ?Stop Anti-inflammatories (NSAIDS) such as Advil, Aleve, Ibuprofen, Motrin, Naproxen, Naprosyn and Aspirin based products such as Excedrin, Goodys Powder, BC Powder. ?Stop ANY OVER THE COUNTER supplements until after surgery. ?You may however, continue to take Tylenol if needed for pain up until the day of surgery. ? ?No Alcohol for 24 hours before or after surgery. ? ?No Smoking including e-cigarettes for 24 hours prior to surgery.  ?No chewable tobacco products for at least 6 hours prior to surgery.  ?No nicotine patches on the day of surgery. ? ?Do not use any "recreational" drugs for at least a week prior to your surgery.  ?Please be advised that the combination of cocaine and anesthesia may have negative outcomes, up to and including death. ?If you test positive for cocaine, your surgery will be cancelled. ? ?On the morning of surgery brush your teeth with toothpaste and water, you may rinse your mouth with mouthwash if you wish. ?Do not  swallow any toothpaste or mouthwash. ? ?Use CHG Soap or wipes as directed on instruction sheet. ? ?Do not wear jewelry, make-up, hairpins, clips or nail polish. ? ?Do not wear lotions, powders, or perfumes.  ? ?Do not shave body from the neck down 48 hours prior to surgery just in case you cut yourself which could leave a site for infection.  ?Also, freshly shaved skin may become irritated if using the CHG soap. ? ?Contact lenses, hearing aids and dentures may not be worn into surgery. ? ?Do not bring valuables to the hospital. [facility] is not responsible for any missing/lost belongings or valuables.  ? ?Notify your doctor if there is any change in your medical condition (cold, fever, infection). ? ?Wear comfortable clothing (specific to your surgery type) to the hospital. ? ?After surgery, you can help prevent lung complications by doing breathing exercises.  ?Take deep breaths and cough every 1-2 hours. Your doctor may order a device called an Incentive Spirometer to help you take deep breaths. ? ?If you are being discharged the day of surgery, you will not be allowed to drive home. ?You will need a responsible adult (18 years or older) to drive you home and stay with you that night.  ? ?If you are taking public transportation, you will need to have a responsible adult (18 years or older) with you. ?Please confirm with your physician that it is acceptable to use public transportation.  ? ?Please call the [facility] Dept. at [phone] if you  have any questions about these instructions. ? ?Surgery Visitation Policy: ? ?Patients undergoing a surgery or procedure may have two family members or support persons with them as long as the person is not COVID-19 positive or experiencing its symptoms.  ?

## 2021-09-26 ENCOUNTER — Ambulatory Visit: Payer: Medicare Other | Admitting: Urgent Care

## 2021-09-26 ENCOUNTER — Other Ambulatory Visit: Payer: Self-pay

## 2021-09-26 ENCOUNTER — Encounter: Payer: Self-pay | Admitting: Vascular Surgery

## 2021-09-26 ENCOUNTER — Ambulatory Visit
Admission: RE | Admit: 2021-09-26 | Discharge: 2021-09-26 | Disposition: A | Discharge status: Home / Self Care | Payer: Medicare Other | Source: Ambulatory Visit | Attending: Vascular Surgery | Admitting: Vascular Surgery

## 2021-09-26 ENCOUNTER — Encounter
Admission: RE | Disposition: A | Discharge status: Home / Self Care | Payer: Self-pay | Source: Ambulatory Visit | Attending: Vascular Surgery

## 2021-09-26 DIAGNOSIS — I120 Hypertensive chronic kidney disease with stage 5 chronic kidney disease or end stage renal disease: Secondary | ICD-10-CM | POA: Insufficient documentation

## 2021-09-26 DIAGNOSIS — E1022 Type 1 diabetes mellitus with diabetic chronic kidney disease: Secondary | ICD-10-CM | POA: Insufficient documentation

## 2021-09-26 DIAGNOSIS — Z992 Dependence on renal dialysis: Secondary | ICD-10-CM | POA: Insufficient documentation

## 2021-09-26 DIAGNOSIS — Z794 Long term (current) use of insulin: Secondary | ICD-10-CM | POA: Insufficient documentation

## 2021-09-26 DIAGNOSIS — N186 End stage renal disease: Secondary | ICD-10-CM | POA: Diagnosis not present

## 2021-09-26 DIAGNOSIS — Z01812 Encounter for preprocedural laboratory examination: Secondary | ICD-10-CM

## 2021-09-26 DIAGNOSIS — F17210 Nicotine dependence, cigarettes, uncomplicated: Secondary | ICD-10-CM | POA: Insufficient documentation

## 2021-09-26 HISTORY — DX: Cannabis use, unspecified, uncomplicated: F12.90

## 2021-09-26 HISTORY — DX: Paroxysmal atrial fibrillation: I48.0

## 2021-09-26 HISTORY — DX: Anemia in chronic kidney disease: N18.9

## 2021-09-26 HISTORY — DX: Atherosclerosis of aorta: I70.0

## 2021-09-26 HISTORY — DX: Heart failure, unspecified: I50.9

## 2021-09-26 HISTORY — DX: Thrombocytopenia, unspecified: D69.6

## 2021-09-26 HISTORY — PX: AV FISTULA PLACEMENT: SHX1204

## 2021-09-26 HISTORY — DX: Anemia in chronic kidney disease: D63.1

## 2021-09-26 HISTORY — DX: End stage renal disease: N18.6

## 2021-09-26 HISTORY — DX: Anoxic brain damage, not elsewhere classified: G93.1

## 2021-09-26 HISTORY — DX: Gastro-esophageal reflux disease without esophagitis: K21.9

## 2021-09-26 LAB — TYPE AND SCREEN
ABO/RH(D): B POS
Antibody Screen: NEGATIVE

## 2021-09-26 LAB — CBC WITH DIFFERENTIAL/PLATELET
Abs Immature Granulocytes: 0.01 10*3/uL (ref 0.00–0.07)
Basophils Absolute: 0 10*3/uL (ref 0.0–0.1)
Basophils Relative: 1 %
Eosinophils Absolute: 0.2 10*3/uL (ref 0.0–0.5)
Eosinophils Relative: 4 %
HCT: 25.2 % — ABNORMAL LOW (ref 39.0–52.0)
Hemoglobin: 7.9 g/dL — ABNORMAL LOW (ref 13.0–17.0)
Immature Granulocytes: 0 %
Lymphocytes Relative: 31 %
Lymphs Abs: 1.8 10*3/uL (ref 0.7–4.0)
MCH: 27.7 pg (ref 26.0–34.0)
MCHC: 31.3 g/dL (ref 30.0–36.0)
MCV: 88.4 fL (ref 80.0–100.0)
Monocytes Absolute: 0.5 10*3/uL (ref 0.1–1.0)
Monocytes Relative: 8 %
Neutro Abs: 3.3 10*3/uL (ref 1.7–7.7)
Neutrophils Relative %: 56 %
Platelets: 259 10*3/uL (ref 150–400)
RBC: 2.85 MIL/uL — ABNORMAL LOW (ref 4.22–5.81)
RDW: 16.2 % — ABNORMAL HIGH (ref 11.5–15.5)
WBC: 5.8 10*3/uL (ref 4.0–10.5)
nRBC: 0 % (ref 0.0–0.2)

## 2021-09-26 LAB — BASIC METABOLIC PANEL
Anion gap: 9 (ref 5–15)
BUN: 34 mg/dL — ABNORMAL HIGH (ref 6–20)
CO2: 28 mmol/L (ref 22–32)
Calcium: 8.1 mg/dL — ABNORMAL LOW (ref 8.9–10.3)
Chloride: 103 mmol/L (ref 98–111)
Creatinine, Ser: 4.77 mg/dL — ABNORMAL HIGH (ref 0.61–1.24)
GFR, Estimated: 16 mL/min — ABNORMAL LOW (ref >60)
Glucose, Bld: 183 mg/dL — ABNORMAL HIGH (ref 70–99)
Potassium: 3.7 mmol/L (ref 3.5–5.1)
Sodium: 140 mmol/L (ref 135–145)

## 2021-09-26 LAB — GLUCOSE, CAPILLARY: Glucose-Capillary: 100 mg/dL — ABNORMAL HIGH (ref 70–99)

## 2021-09-26 SURGERY — INSERTION OF ARTERIOVENOUS (AV) GORE-TEX GRAFT ARM
Anesthesia: General | Laterality: Left

## 2021-09-26 MED ORDER — BUPIVACAINE-EPINEPHRINE (PF) 0.5% -1:200000 IJ SOLN
INTRAMUSCULAR | Status: AC
  Filled 2021-09-26: qty 30

## 2021-09-26 MED ORDER — CEFAZOLIN SODIUM-DEXTROSE 2-4 GM/100ML-% IV SOLN
2.0000 g | INTRAVENOUS | Status: AC
  Administered 2021-09-26: 2 g via INTRAVENOUS

## 2021-09-26 MED ORDER — ONDANSETRON HCL 4 MG/2ML IJ SOLN: 4.0000 mg | Freq: Four times a day (QID) | INTRAMUSCULAR | Status: DC | PRN

## 2021-09-26 MED ORDER — CHLORHEXIDINE GLUCONATE 0.12 % MT SOLN
15.0000 mL | Freq: Once | OROMUCOSAL | Status: DC
Start: 2021-09-26 — End: 2021-09-26

## 2021-09-26 MED ORDER — FENTANYL CITRATE (PF) 100 MCG/2ML IJ SOLN
25.0000 ug | INTRAMUSCULAR | Status: DC | PRN
  Administered 2021-09-26: 25 ug via INTRAVENOUS

## 2021-09-26 MED ORDER — KETAMINE HCL 10 MG/ML IJ SOLN
INTRAMUSCULAR | Status: DC | PRN
  Administered 2021-09-26: 30 mg via INTRAVENOUS

## 2021-09-26 MED ORDER — PROPOFOL 10 MG/ML IV BOLUS
INTRAVENOUS | Status: AC
  Filled 2021-09-26: qty 20

## 2021-09-26 MED ORDER — ORAL CARE MOUTH RINSE: 15.0000 mL | Freq: Once | OROMUCOSAL | Status: DC

## 2021-09-26 MED ORDER — ONDANSETRON HCL 4 MG/2ML IJ SOLN
INTRAMUSCULAR | Status: AC
  Filled 2021-09-26: qty 2

## 2021-09-26 MED ORDER — ONDANSETRON HCL 4 MG/2ML IJ SOLN
INTRAMUSCULAR | Status: DC | PRN
  Administered 2021-09-26: 4 mg via INTRAVENOUS

## 2021-09-26 MED ORDER — HEMOSTATIC AGENTS (NO CHARGE) OPTIME
TOPICAL | Status: DC | PRN
  Administered 2021-09-26: 1 via TOPICAL

## 2021-09-26 MED ORDER — MIDAZOLAM HCL 2 MG/2ML IJ SOLN
INTRAMUSCULAR | Status: DC | PRN
  Administered 2021-09-26: 2 mg via INTRAVENOUS

## 2021-09-26 MED ORDER — CHLORHEXIDINE GLUCONATE CLOTH 2 % EX PADS
6.0000 | MEDICATED_PAD | Freq: Once | CUTANEOUS | Status: DC
Start: 2021-09-26 — End: 2021-09-26

## 2021-09-26 MED ORDER — CEFAZOLIN SODIUM 1 G IJ SOLR
INTRAMUSCULAR | Status: AC
  Filled 2021-09-26: qty 20

## 2021-09-26 MED ORDER — HEPARIN SODIUM (PORCINE) 1000 UNIT/ML IJ SOLN
INTRAMUSCULAR | Status: AC
  Filled 2021-09-26: qty 10

## 2021-09-26 MED ORDER — "VISTASEAL 4 ML SINGLE DOSE KIT "
PACK | CUTANEOUS | Status: DC | PRN
  Administered 2021-09-26: 4 mL via TOPICAL

## 2021-09-26 MED ORDER — CHLORHEXIDINE GLUCONATE CLOTH 2 % EX PADS: 6.0000 | MEDICATED_PAD | Freq: Once | CUTANEOUS | Status: DC

## 2021-09-26 MED ORDER — HEPARIN SODIUM (PORCINE) 5000 UNIT/ML IJ SOLN
INTRAMUSCULAR | Status: AC
  Filled 2021-09-26: qty 1

## 2021-09-26 MED ORDER — FENTANYL CITRATE (PF) 100 MCG/2ML IJ SOLN
INTRAMUSCULAR | Status: AC
  Filled 2021-09-26: qty 2

## 2021-09-26 MED ORDER — HEPARIN SODIUM (PORCINE) 1000 UNIT/ML IJ SOLN
INTRAMUSCULAR | Status: DC | PRN
  Administered 2021-09-26: 3000 [IU] via INTRAVENOUS

## 2021-09-26 MED ORDER — FENTANYL CITRATE (PF) 100 MCG/2ML IJ SOLN
INTRAMUSCULAR | Status: DC | PRN
  Administered 2021-09-26 (×2): 50 ug via INTRAVENOUS

## 2021-09-26 MED ORDER — PHENYLEPHRINE 80 MCG/ML (10ML) SYRINGE FOR IV PUSH (FOR BLOOD PRESSURE SUPPORT)
PREFILLED_SYRINGE | INTRAVENOUS | Status: DC | PRN
  Administered 2021-09-26: 80 ug via INTRAVENOUS
  Administered 2021-09-26: 160 ug via INTRAVENOUS

## 2021-09-26 MED ORDER — SODIUM CHLORIDE 0.9 % IV SOLN: INTRAVENOUS | Status: DC

## 2021-09-26 MED ORDER — STERILE WATER FOR IRRIGATION IR SOLN
Status: DC | PRN
  Administered 2021-09-26: 150 mL

## 2021-09-26 MED ORDER — PENTAFLUOROPROP-TETRAFLUOROETH EX AERO
INHALATION_SPRAY | CUTANEOUS | Status: AC
  Filled 2021-09-26: qty 30

## 2021-09-26 MED ORDER — PROPOFOL 10 MG/ML IV BOLUS
INTRAVENOUS | Status: DC | PRN
  Administered 2021-09-26: 120 mg via INTRAVENOUS
  Administered 2021-09-26: 80 mg via INTRAVENOUS

## 2021-09-26 MED ORDER — SUCCINYLCHOLINE CHLORIDE 200 MG/10ML IV SOSY
PREFILLED_SYRINGE | INTRAVENOUS | Status: DC | PRN
  Administered 2021-09-26: 120 mg via INTRAVENOUS

## 2021-09-26 MED ORDER — SEVOFLURANE IN SOLN
RESPIRATORY_TRACT | Status: AC
Start: 2021-09-26 — End: ?
  Filled 2021-09-26: qty 250

## 2021-09-26 MED ORDER — KETAMINE HCL 50 MG/5ML IJ SOSY
PREFILLED_SYRINGE | INTRAMUSCULAR | Status: AC
  Filled 2021-09-26: qty 5

## 2021-09-26 MED ORDER — HYDROMORPHONE HCL 1 MG/ML IJ SOLN: 1.0000 mg | Freq: Once | INTRAMUSCULAR | Status: DC | PRN

## 2021-09-26 MED ORDER — PHENYLEPHRINE HCL-NACL 20-0.9 MG/250ML-% IV SOLN
INTRAVENOUS | Status: AC
  Filled 2021-09-26: qty 250

## 2021-09-26 MED ORDER — MIDAZOLAM HCL 2 MG/2ML IJ SOLN
INTRAMUSCULAR | Status: AC
Start: 2021-09-26 — End: ?
  Filled 2021-09-26: qty 2

## 2021-09-26 MED ORDER — ONDANSETRON HCL 4 MG/2ML IJ SOLN: 4.0000 mg | Freq: Once | INTRAMUSCULAR | Status: DC | PRN

## 2021-09-26 MED ORDER — PHENYLEPHRINE HCL-NACL 20-0.9 MG/250ML-% IV SOLN
INTRAVENOUS | Status: DC | PRN
  Administered 2021-09-26: 30 ug/min via INTRAVENOUS

## 2021-09-26 MED ORDER — SODIUM CHLORIDE 0.9 % IV SOLN
INTRAVENOUS | Status: DC | PRN
  Administered 2021-09-26: 500 mL

## 2021-09-26 MED ORDER — OXYCODONE-ACETAMINOPHEN 5-325 MG PO TABS: 1.0000 | ORAL_TABLET | ORAL | 0 refills | Status: DC | PRN

## 2021-09-26 SURGICAL SUPPLY — 58 items
ADH SKN CLS APL DERMABOND .7 (GAUZE/BANDAGES/DRESSINGS) ×1
APL PRP STRL LF DISP 70% ISPRP (MISCELLANEOUS) ×2
BAG DECANTER FOR FLEXI CONT (MISCELLANEOUS) ×3 IMPLANT
BLADE SURG SZ11 CARB STEEL (BLADE) ×3 IMPLANT
BOOT SUTURE AID YELLOW STND (SUTURE) ×3 IMPLANT
BRUSH SCRUB EZ  4% CHG (MISCELLANEOUS) ×2
BRUSH SCRUB EZ 4% CHG (MISCELLANEOUS) ×2 IMPLANT
CHLORAPREP W/TINT 26 (MISCELLANEOUS) ×4 IMPLANT
CLIP SPRNG 6 S-JAW DBL (CLIP) ×2 IMPLANT
CLIP SPRNG 6MM S-JAW DBL (CLIP) ×2
DERMABOND ADVANCED (GAUZE/BANDAGES/DRESSINGS) ×1
DERMABOND ADVANCED .7 DNX12 (GAUZE/BANDAGES/DRESSINGS) ×2 IMPLANT
ELECT CAUTERY BLADE 6.4 (BLADE) ×3 IMPLANT
ELECT REM PT RETURN 9FT ADLT (ELECTROSURGICAL) ×2
ELECTRODE REM PT RTRN 9FT ADLT (ELECTROSURGICAL) ×2 IMPLANT
GLOVE BIO SURGEON STRL SZ7 (GLOVE) ×3 IMPLANT
GOWN STRL REUS W/ TWL LRG LVL3 (GOWN DISPOSABLE) ×2 IMPLANT
GOWN STRL REUS W/ TWL XL LVL3 (GOWN DISPOSABLE) ×2 IMPLANT
GOWN STRL REUS W/TWL LRG LVL3 (GOWN DISPOSABLE) ×4
GOWN STRL REUS W/TWL XL LVL3 (GOWN DISPOSABLE) ×2
GRAFT PROPATEN STD WALL 6X40 (Vascular Products) ×1 IMPLANT
HEMOSTAT SURGICEL 2X3 (HEMOSTASIS) ×3 IMPLANT
IV NS 500ML (IV SOLUTION) ×2
IV NS 500ML BAXH (IV SOLUTION) ×2 IMPLANT
KIT TURNOVER KIT A (KITS) ×3 IMPLANT
LABEL OR SOLS (LABEL) ×3 IMPLANT
LOOP RED MAXI  1X406MM (MISCELLANEOUS) ×1
LOOP VESSEL MAXI  1X406 RED (MISCELLANEOUS) ×1
LOOP VESSEL MAXI 1X406 RED (MISCELLANEOUS) ×2 IMPLANT
LOOP VESSEL MINI 0.8X406 BLUE (MISCELLANEOUS) ×2 IMPLANT
LOOPS BLUE MINI 0.8X406MM (MISCELLANEOUS) ×1
MANIFOLD NEPTUNE II (INSTRUMENTS) ×3 IMPLANT
NDL FILTER BLUNT 18X1 1/2 (NEEDLE) ×2 IMPLANT
NEEDLE FILTER BLUNT 18X 1/2SAF (NEEDLE) ×1
NEEDLE FILTER BLUNT 18X1 1/2 (NEEDLE) ×1 IMPLANT
NS IRRIG 500ML POUR BTL (IV SOLUTION) ×3 IMPLANT
PACK EXTREMITY ARMC (MISCELLANEOUS) ×3 IMPLANT
PAD PREP 24X41 OB/GYN DISP (PERSONAL CARE ITEMS) ×3 IMPLANT
SOLUTION CELL SAVER (CLIP) ×1 IMPLANT
SPIKE FLUID TRANSFER (MISCELLANEOUS) ×3 IMPLANT
STOCKINETTE 48X4 2 PLY STRL (GAUZE/BANDAGES/DRESSINGS) ×2 IMPLANT
STOCKINETTE STRL 4IN 9604848 (GAUZE/BANDAGES/DRESSINGS) ×2 IMPLANT
SUT GORETEX 6.0 TT9 (SUTURE) ×3 IMPLANT
SUT MNCRL AB 4-0 PS2 18 (SUTURE) ×3 IMPLANT
SUT PROLENE 6 0 BV (SUTURE) ×12 IMPLANT
SUT SILK 0 SH 30 (SUTURE) ×3 IMPLANT
SUT SILK 2 0 (SUTURE) ×2
SUT SILK 2 0 SH (SUTURE) ×1 IMPLANT
SUT SILK 2-0 18XBRD TIE 12 (SUTURE) ×2 IMPLANT
SUT SILK 3 0 (SUTURE) ×2
SUT SILK 3-0 18XBRD TIE 12 (SUTURE) ×2 IMPLANT
SUT SILK 4 0 (SUTURE) ×2
SUT SILK 4-0 18XBRD TIE 12 (SUTURE) ×2 IMPLANT
SUT VIC AB 3-0 SH 27 (SUTURE) ×2
SUT VIC AB 3-0 SH 27X BRD (SUTURE) ×2 IMPLANT
SYR 20ML LL LF (SYRINGE) ×3 IMPLANT
SYR 3ML LL SCALE MARK (SYRINGE) ×3 IMPLANT
WATER STERILE IRR 500ML POUR (IV SOLUTION) ×3 IMPLANT

## 2021-09-26 NOTE — Transfer of Care (Signed)
Immediate Anesthesia Transfer of Care Note ? ?Patient: Jonathan Mcmillan ? ?Procedure(s) Performed: INSERTION OF ARTERIOVENOUS (AV) GORE-TEX GRAFT ARM (BRACHIAL AXILLARY ) (Left) ? ?Patient Location: PACU ? ?Anesthesia Type:General ? ?Level of Consciousness: drowsy ? ?Airway & Oxygen Therapy: Patient Spontanous Breathing and Patient connected to face mask oxygen ? ?Post-op Assessment: Report given to RN, Post -op Vital signs reviewed and stable and Patient moving all extremities ? ?Post vital signs: Reviewed and stable ? ?Last Vitals:  ?Vitals Value Taken Time  ?BP 130/98 09/26/21 1347  ?Temp    ?Pulse 79 09/26/21 1351  ?Resp 14 09/26/21 1351  ?SpO2 100 % 09/26/21 1351  ?Vitals shown include unvalidated device data. ? ?Last Pain:  ?Vitals:  ? 09/26/21 0957  ?PainSc: 0-No pain  ?   ? ?  ? ?Complications: No notable events documented. ?

## 2021-09-26 NOTE — Anesthesia Preprocedure Evaluation (Signed)
Anesthesia Evaluation  ?Patient identified by MRN, date of birth, ID band ?Patient awake ? ? ? ?Reviewed: ?Allergy & Precautions, H&P , NPO status , Patient's Chart, lab work & pertinent test results, reviewed documented beta blocker date and time  ? ?Airway ?Mallampati: II ? ?TM Distance: >3 FB ?Neck ROM: full ? ? ? Dental ? ?(+) Teeth Intact ?  ?Pulmonary ?pneumonia, resolved, Current SmokerPatient did not abstain from smoking.,  ?  ?Pulmonary exam normal ? ? ? ? ? ? ? Cardiovascular ?Exercise Tolerance: Good ?hypertension, On Medications ?(-) angina+ Past MI and +CHF  ?(-) Orthopnea and (-) PND Atrial Fibrillation  ?Rate:Normal ? ? ?  ?Neuro/Psych ?PSYCHIATRIC DISORDERS Anxiety Depression negative neurological ROS ?   ? GI/Hepatic ?Neg liver ROS, GERD  Medicated,  ?Endo/Other  ?negative endocrine ROSdiabetes, Well Controlled, Type 1, Insulin Dependent ? Renal/GU ?CRF and ESRFRenal disease  ?negative genitourinary ?  ?Musculoskeletal ? ? Abdominal ?  ?Peds ? Hematology ? ?(+) Blood dyscrasia, anemia ,   ?Anesthesia Other Findings ? ? Reproductive/Obstetrics ?negative OB ROS ? ?  ? ? ? ? ? ? ? ? ? ? ? ? ? ?  ?  ? ? ? ? ? ? ? ? ?Anesthesia Physical ?Anesthesia Plan ? ?ASA: 4 ? ?Anesthesia Plan: General LMA  ? ?Post-op Pain Management:   ? ?Induction:  ? ?PONV Risk Score and Plan:  ? ?Airway Management Planned:  ? ?Additional Equipment:  ? ?Intra-op Plan:  ? ?Post-operative Plan:  ? ?Informed Consent: I have reviewed the patients History and Physical, chart, labs and discussed the procedure including the risks, benefits and alternatives for the proposed anesthesia with the patient or authorized representative who has indicated his/her understanding and acceptance.  ? ? ? ? ? ?Plan Discussed with: CRNA ? ?Anesthesia Plan Comments: (Complicated pmhx despite age. Presently stable per hx and review. Refusing RA block and will plan, per request, on GOT. ja)  ? ? ? ? ? ? ?Anesthesia Quick  Evaluation ? ?

## 2021-09-26 NOTE — Op Note (Signed)
VEIN AND VASCULAR SURGERY ? ?OPERATIVE NOTE ? ? ?PROCEDURE:  Left upper arm brachial artery to axillary vein arteriovenous graft ? ?PRE-OPERATIVE DIAGNOSIS: 1. end stage renal disease  ? ? ?POST-OPERATIVE DIAGNOSIS: same ? ?SURGEON: [name] ? ?ASSISTANT(S): none ? ?ANESTHESIA: general ? ?ESTIMATED BLOOD LOSS: 25 cc ? ?FINDING(S): ?none ? ?SPECIMEN(S):  None ? ?INDICATIONS:   ?Jonathan Mcmillan is a 26 y.o. male who presents with end stage renal disease and need for permanent dialysis access.  Risk, benefits, and alternatives to access surgery were discussed.  The patient is aware the risks include but are not limited to: bleeding, infection, steal syndrome, nerve damage, ischemic monomelic neuropathy, failure to mature, and need for additional procedures.  The patient is aware of the risks and elects to proceed forward. ? ?DESCRIPTION: ?After full informed written consent was obtained from the patient, the patient was brought back to the operating room and placed supine upon the operating table.  The patient was given IV antibiotics prior to proceeding.  After obtaining adequate sedation, the patient was prepped and draped in standard fashion for a left arm access procedure.  I turned my attention first to the antecubitum.   I made an incision over the brachial artery, and dissected down through the subcutaneous tissue to the fascia carefully and was able to dissect out the brachial artery.  The artery was about patent and of adequate size to support a graft.  It was controlled proximally and distally with vessel loops and then I turned my attention to the high bicipital groove in the axilla.  I made an incision and dissected down through the subcutaneous tissue and fascia until I reached the axillary vein.  It was patent and adequate size for graft creation.  I then dissected this vein proximally and distal and prepared it for control with Bulldog clamps.  I took a Dietitian and dissected from  the antecubital up to the axillary incision.  Then I delivered the 6 mm Propaten pTFE graft, through this metal tunneler and then pulled out the metal tunneler leaving the graft in place and making sure the line was up for orientation.  I then gave the patient 3000 units of heparin to gain some anticoagulation.  After waiting 3 minutes, I placed the brachial artery under tension proximally and distally with vessel loops, made an arteriotomy and extended it with a Potts scissor.  I sewed the graft to this arteriotomy with a running stitch of CV-6 suture.  At this point, then I completed the anastomosis in the usual fashion.  I released the vessel loops on the inflow and allowed the artery to decompress through the graft. There was good pulsatile bleeding through this graft.  I clamped the graft near its arterial anastomosis and sucked out all the blood in the graft and loaded the graft with heparinized saline.  At this point, I pulled the graft to appropriate length and reset my exposure of the axillary vein.  Then, I controlled the vein with Bulldog clamps.  An anterior wall venotomy was created with an 11 blade and extended with Potts scissors.  I then spatulated the graft to facilitate an end-to- side anastomosis matching the arteriotomy.  In the process of spatulating, I cut the graft to appropriate length for this anastomosis.  This graft was sewn to the vein in an end-to-side configuration with a running CV-6 suture.  Prior to completing this anastomosis, I allowed the vein to back bleed and then  I also allowed the artery to bleed in an antegrade fashion.  I completed this anastomosis in the usual fashion, and then irrigated out the high bicipital exposure and then placed Surgicel and Evicel.  I then turned my attention back to the antecubitum.  There was pulsatile flow in the artery beyond the graft.   At this point, I washed out the antecubital incision. Surgicel and Evicel were then placed. There was no more  active bleeding.  The subcutaneous tissue was reapproximated with a running stitch of 3-0 Vicryl.  The skin was then reapproximated with a running subcuticular 4-0 Monocryl.  The skin was then cleaned, dried, and Dermabond used to reinforce the skin closure.  We then turned our attention to the axillary incision.  The subcutaneous tissue was repaired with running stitch of 3-0 Vicryl.  The skin was then reapproximated with running subcuticular 4-0 Monocryl.  The skin was then cleaned, dried, and then the skin closure was reinforced with Dermabond.  The patient was then awakened from anesthesia and taken to the recovery room in stable condition having tolerated the procedure well.   ? ?COMPLICATIONS: None ? ?CONDITION: Stable ? ? ?[name] ?09/26/2021 ?1:54 PM ? ? ?This note was created with Dragon Medical transcription system. Any errors in dictation are purely unintentional.  ?

## 2021-09-26 NOTE — Progress Notes (Addendum)
?  Perioperative Services ?Pre-Admission/Anesthesia Testing ? ?  ?Date: 09/26/21 ? ?Name: Jonathan Mcmillan ?MRN:   [id number] ? ?Re: Clearance from cardiology for dialysis graft placement ? ?Received communication from Dr. Bunnie Domino office (vascular surgery) that they would like to move patient's case moved up 09/26/2021. Advised office that patient was pending clearance from cardiology. Patient s/p PEA arrest in 05/2021 felt to be secondary to metabolic derangements. Once stabilized during his admission, patient found to have a cardiomyopathy (EF 35-40%) and new onset paroxysmal atrial fibrillation. He was started on beta blocker and antiarrythmic therapies.  ? ?Patient was seen in post-admission follow up by cardiology on 09/05/2021. At the time of his clinic visit, exam was felt to stable. He was noted to be sinus rhythm at time of clinic visit. MD concerned that antiarrythmic will develop toxicity to the prescribed antiarrythmic. Patient has been referred to EP for further discussions about the potential for an LAA closure (Watchman) device implantation.  ? ?I was able to speak directly with attending cardiologist Garen Lah, MD) on the afternoon of 09/25/2021. Dr. Garen Lah was advised of plans to expedite patient's case, with plans to proceed on 09/26/2021. MD advised that EP evaluation should not delay dialysis graft placement. MD issued clearance for patient to proceed at an ACCEPTABLE risk of potential cardiovascular complications. Updated surgeon's office that clearance had been obtained and case could proceed as planned.  ? ?Honor Loh, MSN, APRN, FNP-C, CEN ?Center  ?Peri-operative Services Nurse Practitioner ?Phone: [phone] ?09/26/21 8:30 AM ? ?NOTE: This note has been prepared using L[employer]. Despite my best ability to proofread, there is always the potential that unintentional transcriptional errors may still occur from this process. ?.  ?

## 2021-09-26 NOTE — Anesthesia Procedure Notes (Signed)
Procedure Name: Intubation ?Date/Time: 09/26/2021 11:46 AM ?Performed by: Esaw Grandchild, CRNA ?Pre-anesthesia Checklist: Patient identified, Emergency Drugs available, Suction available and Patient being monitored ?Patient Re-evaluated:Patient Re-evaluated prior to induction ?Oxygen Delivery Method: Circle system utilized ?Preoxygenation: Pre-oxygenation with 100% oxygen ?Induction Type: IV induction, Rapid sequence and Cricoid Pressure applied ?Ventilation: Mask ventilation without difficulty ?Laryngoscope Size: McGraph and 3 ?Grade View: Grade I ?Tube type: Oral ?Tube size: 7.5 mm ?Number of attempts: 1 ?Airway Equipment and Method: Stylet, Bite block and Oral airway ?Placement Confirmation: ETT inserted through vocal cords under direct vision, positive ETCO2 and breath sounds checked- equal and bilateral ?Secured at: 21 cm ?Tube secured with: Tape ?Dental Injury: Teeth and Oropharynx as per pre-operative assessment  ? ? ? ? ?

## 2021-09-26 NOTE — Interval H&P Note (Signed)
History and Physical Interval Note: ? ?09/26/2021 ?11:33 AM ? ?Jonathan Mcmillan  has presented today for surgery, with the diagnosis of ESRD.  The various methods of treatment have been discussed with the patient and family. After consideration of risks, benefits and other options for treatment, the patient has consented to  Procedure(s): ?INSERTION OF ARTERIOVENOUS (AV) GORE-TEX GRAFT ARM (BRACHIAL AXILLARY ) (Left) as a surgical intervention.  The patient's history has been reviewed, patient examined, no change in status, stable for surgery.  I have reviewed the patient's chart and labs.  Questions were answered to the patient's satisfaction.   ? ? ?[name] ? ? ?

## 2021-09-26 NOTE — Discharge Instructions (Signed)

## 2021-09-27 ENCOUNTER — Encounter: Payer: Self-pay | Admitting: Vascular Surgery

## 2021-09-27 NOTE — Anesthesia Postprocedure Evaluation (Signed)
Anesthesia Post Note ? ?Patient: LAMARIO MANI ? ?Procedure(s) Performed: INSERTION OF ARTERIOVENOUS (AV) GORE-TEX GRAFT ARM (BRACHIAL AXILLARY ) (Left) ? ?Patient location during evaluation: PACU ?Anesthesia Type: General ?Level of consciousness: awake and alert ?Pain management: pain level controlled ?Vital Signs Assessment: post-procedure vital signs reviewed and stable ?Respiratory status: spontaneous breathing, nonlabored ventilation, respiratory function stable and patient connected to nasal cannula oxygen ?Cardiovascular status: blood pressure returned to baseline and stable ?Postop Assessment: no apparent nausea or vomiting ?Anesthetic complications: no ? ? ?No notable events documented. ? ? ?Last Vitals:  ?Vitals:  ? 09/26/21 1415 09/26/21 1429  ?BP: (!) 136/94 (!) 144/89  ?Pulse: 85 86  ?Resp: 17   ?Temp: (!) 35.9 ?C (!) 36.1 ?C  ?SpO2: 99% 100%  ?  ?Last Pain:  ?Vitals:  ? ? ?  ?  ?  ?  ?  ?  ? ?Molli Barrows ? ? ? ? ?

## 2021-09-28 ENCOUNTER — Ambulatory Visit: Payer: Self-pay | Admitting: Physician Assistant

## 2021-10-05 ENCOUNTER — Encounter: Payer: Medicare Other | Admitting: Physician Assistant

## 2021-10-05 DIAGNOSIS — L89154 Pressure ulcer of sacral region, stage 4: Secondary | ICD-10-CM | POA: Diagnosis not present

## 2021-10-05 NOTE — Progress Notes (Addendum)
KEYANDRE, PILEGGI ([id number]) Visit Report for 10/05/2021 Chief Complaint Document Details Patient Name: Jonathan Mcmillan, Jonathan Mcmillan. Date of Service: 10/05/2021 1:15 PM Medical Record Number: [id number] Patient Account Number: [id number] Date of Birth/Sex: 1995-07-07 (25 y.o. M) Treating RN: Carlene Coria Primary Care Provider: Denton Lank Other Clinician: Referring Provider: Denton Lank Treating Provider/Extender: Skipper Cliche in Treatment: 11 Information Obtained from: Patient Chief Complaint Sacral, left lateral foot, and right knee pressure ulcers Electronic Signature(s) Signed: 10/05/2021 1:36:40 PM By: Worthy Keeler PA-C Entered By: Worthy Keeler on 10/05/2021 13:36:40 Jonathan Mcmillan ([id number]) -------------------------------------------------------------------------------- Debridement Details Patient Name: Jonathan Mcmillan. Date of Service: 10/05/2021 1:15 PM Medical Record Number: [id number] Patient Account Number: [id number] Date of Birth/Sex: 21-May-1995 (25 y.o. M) Treating RN: Carlene Coria Primary Care Provider: Denton Lank Other Clinician: Referring Provider: Denton Lank Treating Provider/Extender: Skipper Cliche in Treatment: 11 Debridement Performed for Wound #2 Left,Lateral Foot Assessment: Performed By: Physician Tommie Sams., PA-C Debridement Type: Debridement Level of Consciousness (Pre- Awake and Alert procedure): Pre-procedure Verification/Time Out Yes - 13:35 Taken: Start Time: 13:35 Total Area Debrided (L x W): 1.5 (cm) x 2 (cm) = 3 (cm) Tissue and other material Viable, Non-Viable, Callus, Slough, Subcutaneous, Biofilm, Slough debrided: Level: Skin/Subcutaneous Tissue Debridement Description: Excisional Instrument: Curette Bleeding: Moderate Hemostasis Achieved: Pressure End Time: 13:44 Procedural Pain: 0 Post Procedural Pain: 0 Response to Treatment: Procedure was tolerated well Level of Consciousness (Post- Awake and  Alert procedure): Post Debridement Measurements of Total Wound Length: (cm) 1.5 Stage: Category/Stage IV Width: (cm) 4.5 Depth: (cm) 1 Volume: (cm) 5.301 Character of Wound/Ulcer Post Debridement: Improved Post Procedure Diagnosis Same as Pre-procedure Electronic Signature(s) Signed: 10/05/2021 2:19:12 PM By: Carlene Coria RN Signed: 10/06/2021 4:54:58 PM By: Worthy Keeler PA-C Entered By: Carlene Coria on 10/05/2021 13:41:26 Jonathan Mcmillan ([id number]) -------------------------------------------------------------------------------- HPI Details Patient Name: Jonathan Mcmillan. Date of Service: 10/05/2021 1:15 PM Medical Record Number: [id number] Patient Account Number: [id number] Date of Birth/Sex: April 11, 1996 (25 y.o. M) Treating RN: Carlene Coria Primary Care Provider: Denton Lank Other Clinician: Referring Provider: Denton Lank Treating Provider/Extender: Skipper Cliche in Treatment: 11 History of Present Illness HPI Description: 07/14/2021 patient presents today for initial inspection here in the clinic concerning an issue that he has been having with wounds over the left lateral foot, right knee, and the largest of these is the sacral region where there is significant necrotic tissue noted in the base of this wound. Subsequently the patient is referred to [place] from what appears to be primary care in order for further management of his wounds. He also sees physical medicine and rehabilitation. I did receive a lot of the notes from there as far as what is been going on with regard to this gentleman. Patient is a 26 year old male who had an anoxic encephalopathy, end-stage renal disease on hemodialysis, he has been disabled with generalized weakness and subsequently developed a sacral decubitus ulcer which apparently was present before he went into the hospital according to what is not tells me but worsened during the time in the ICU simply because he was unable to be turned with all  the equipment that he was hooked up to. Again it sounds like they did what they could to try to keep this under control but nonetheless due to the severity of his illness it sounds as if he is lucky to be alive to be perfectly honest. Subsequently the patient also does have diabetes mellitus, hypertension, nicotine dependence, he did  go to the emergency department on 07/05/2021 due to a "abscess" in the gluteal region. The family member had seen this and wanted him to be checked out. During that time medical staff wanted to admit the patient but he refused. On 07/06/2021 the patient was subsequently discharged from the hospital [facility] and apparently they were trying to get in touch according to the notes with his aunt who is the legal guardian but were not able to. That was around 12:37 AM. Nonetheless in the interim since they have been basically at home using Santyl which was the recommendation from the hospital. This has been on all locations. Dry dressings have been applied following. Unfortunately this just does not seem to be doing quite enough especially for the sacral area which is a significant wound to be honest. We do not have any sign of an x-ray that was done of the sacral area nor that they know of anything in particular that was done at the hospital. With all that being said I think an x-ray would be a good idea based on what I am seeing currently this does appear to be quite significant from a wound care perspective. 3/14; patient presents for follow-up. He has no issues or complaints today. He has been using Dakin's wet-to-dry dressings to the wound beds. He denies signs of infection. He reports Currently taking Augmentin. 08/08/2021 upon evaluation today patient actually appears to be doing better in regard to his wounds in the sacral region as well as the knee both of which are showing signs of excellent improvement. In regard to his foot I am not certain exactly what  his blood flow is at this point. That something but I am going to spend a little bit of time looking into the chart regarding. Fortunately I do not see any signs of infection but I do believe he is probably can need some sharp debridement here but I do not want to perform this until we can confirm good blood flow. I know we were able to do this first visit because he was in the bed in the room where we can get him in position. We also were not able to do at last visit as we did not end up actually seeing him due to some incontinence issues. Nonetheless I think that we need to plan for ABIs at the next visit. 08-17-2021 upon evaluation today patient appears to be doing well with regard to his sacral wound as well as the knee the knee is completely healed. In regard to the foot he does have need for sharp debridement today. I am going to do that he is noncompressible but nonetheless has good pulses and good capillary refill there is no evidence of compromise here although he does have a lot of necrotic tissue we need to help clean away here. 08-24-2021 upon evaluation today patient's sacral region is actually doing quite well. Unfortunately the left lateral foot area is not doing nearly as well there is a lot of necrotic tissue here including necrotic muscle which I would have to attempt to remove today. Overall I do feel like that the patient is showing some signs of improvement were still holding as far as his insurance is concerned and the MRI being scheduled apparently we do not have documentation of insurance that will cover for the MRI and therefore they are not scheduling it at this point. Nonetheless his aunt does need to figure this out as soon as possible as  we really do need to look into getting this MRI done ASAP with regard to the left foot. 08-31-2021 upon evaluation today patient appears to be doing better in regard to his foot ulcer though he still has a significant amount of  necrotic tissue. Apparently during the conversation today it became apparent that he has not even taken the Augmentin I previously prescribed for him. Again his x-ray did show evidence of potential for osteomyelitis but we had ordered an MRI but at the same time he was unable to get this as apparently he does not have insurance that covers anything that we are actually investigating and doing at this point. It was going to be close to $700 and it appears that he and the family have decided not to do this. Nonetheless he is going to need antibiotic treatment and really for at least 6 weeks based on what we are seeing. It looks like Omnicef should be appropriate based on the culture results from last week he is also going require some debridement today 4/27; large wound on the left lateral foot. Undermining but about the same in terms of measurements as last week. According to our intake nurse condition of the wound bed looks better. Similarly the area on the sacrum seems to be filling and inferiorly although there is still under mining at 12:00. Using Dakin's wet-to-dry 5/10; patient presents for follow-up. He has no issues or complaints today. He states he has some days left to finish his antibiotic course. He has been using Dakin's wet-to-dry dressings. He denies signs of infection. 10-05-2021 upon evaluation today patient appears to be doing well with regard to his wounds. He has been tolerating the dressing changes without complication. Fortunately I do not see any evidence of infection locally or systemically which is great news. No fevers, chills, nausea, vomiting, or diarrhea. Electronic Signature(s) Signed: 10/05/2021 5:21:13 PM By: Worthy Keeler PA-C Entered By: Worthy Keeler on 10/05/2021 17:21:13 Jonathan Mcmillan, Jonathan Mcmillan ([id number], Jonathan Mcmillan ([id number]) -------------------------------------------------------------------------------- Physical Exam Details Patient Name: Jonathan Mcmillan, Jonathan Mcmillan. Date of Service: 10/05/2021 1:15 PM Medical Record Number: [id number] Patient Account Number: [id number] Date of Birth/Sex: 1996-02-28 (25 y.o. M) Treating RN: Carlene Coria Primary Care Provider: Denton Lank Other Clinician: Referring Provider: Denton Lank Treating Provider/Extender: Jeri Cos Weeks in Treatment: 4 Constitutional Well-nourished and well-hydrated in no acute distress. Respiratory normal breathing without difficulty. Psychiatric this patient is able to make decisions and demonstrates good insight into disease process. Alert and Oriented x 3. pleasant and cooperative. Notes Patient's wounds again are showing signs of excellent granulation epithelization overall feel like were definitely headed in the right direction and both wounds are showing signs of significant improvement and very pleased with the progress here. The biggest issue there was some maceration his dressings need to be changed every day although they have been changed since Tuesday this is Thursday due to the fact he was coming here he was not sure they needed to be done. Electronic Signature(s) Signed: 10/05/2021 5:21:36 PM By: Worthy Keeler PA-C Entered By: Worthy Keeler on 10/05/2021 17:21:36 Jonathan Mcmillan ([id number]) -------------------------------------------------------------------------------- Physician Orders Details Patient Name: Jonathan Mcmillan, Jonathan Mcmillan. Date of Service: 10/05/2021 1:15 PM Medical Record Number: [id number] Patient Account Number: [id number] Date of Birth/Sex: 1996-01-31 (25 y.o. M) Treating RN: Carlene Coria Primary Care Provider: Denton Lank Other Clinician: Referring Provider: Denton Lank Treating Provider/Extender: Skipper Cliche in Treatment: 11 Verbal / Phone Orders: No Diagnosis Coding ICD-10 Coding Code Description  L89.154 Pressure ulcer of sacral region, stage 4 L89.894 Pressure ulcer of other site, stage 4 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, not elsewhere  classified I25.10 Atherosclerotic heart disease of native coronary artery without angina pectoris Follow-up Appointments o Return Appointment in 2 weeks. Bathing/ Shower/ Hygiene o May shower; gently cleanse wound with antibacterial soap, rinse and pat dry prior to dressing wounds Wound Treatment Wound #1 - Sacrum Primary Dressing: Gauze 1 x Per Day/30 Days Discharge Instructions: moistened with Dakins Solution 1/4 strngth Secondary Dressing: ABD Pad 5x9 (in/in) (Generic) 1 x Per Day/30 Days Discharge Instructions: Cover with ABD pad Secured With: Medipore Tape - 60M Medipore H Soft Cloth Surgical Tape, 2x2 (in/yd) (Generic) 1 x Per Day/30 Days Wound #2 - Foot Wound Laterality: Left, Lateral Primary Dressing: Gauze 1 x Per Day/30 Days Discharge Instructions: moistened with Dakins Solution 1/4 strngth Secondary Dressing: ABD Pad 5x9 (in/in) (Generic) 1 x Per Day/30 Days Discharge Instructions: Cover with ABD pad Secured With: Medipore Tape - 60M Medipore H Soft Cloth Surgical Tape, 2x2 (in/yd) (Generic) 1 x Per Day/30 Days Electronic Signature(s) Signed: 10/05/2021 2:19:12 PM By: Carlene Coria RN Signed: 10/06/2021 4:54:58 PM By: Worthy Keeler PA-C Entered By: Carlene Coria on 10/05/2021 13:41:51 Jonathan Mcmillan ([id number]) -------------------------------------------------------------------------------- Problem List Details Patient Name: Jonathan Mcmillan. Date of Service: 10/05/2021 1:15 PM Medical Record Number: [id number] Patient Account Number: [id number] Date of Birth/Sex: 05-Dec-1995 (25 y.o. M) Treating RN: Carlene Coria Primary Care Provider: Denton Lank Other Clinician: Referring Provider: Denton Lank Treating Provider/Extender: Skipper Cliche in Treatment: 11 Active Problems ICD-10 Encounter Code Description Active Date MDM Diagnosis L89.154 Pressure ulcer of sacral region, stage 4 07/14/2021 No Yes L89.894 Pressure ulcer of other site, stage 4 07/14/2021 No Yes G93.1  Anoxic brain damage, not elsewhere classified 07/14/2021 No Yes I25.10 Atherosclerotic heart disease of native coronary artery without angina 07/14/2021 No Yes pectoris Inactive Problems Resolved Problems Electronic Signature(s) Signed: 10/05/2021 1:36:30 PM By: Worthy Keeler PA-C Entered By: Worthy Keeler on 10/05/2021 13:36:29 Jonathan Mcmillan ([id number]) -------------------------------------------------------------------------------- Progress Note Details Patient Name: Jonathan Mcmillan. Date of Service: 10/05/2021 1:15 PM Medical Record Number: [id number] Patient Account Number: [id number] Date of Birth/Sex: January 12, 1996 (25 y.o. M) Treating RN: Carlene Coria Primary Care Provider: Denton Lank Other Clinician: Referring Provider: Denton Lank Treating Provider/Extender: Skipper Cliche in Treatment: 11 Subjective Chief Complaint Information obtained from Patient Sacral, left lateral foot, and right knee pressure ulcers History of Present Illness (HPI) 07/14/2021 patient presents today for initial inspection here in the clinic concerning an issue that he has been having with wounds over the left lateral foot, right knee, and the largest of these is the sacral region where there is significant necrotic tissue noted in the base of this wound. Subsequently the patient is referred to [place] from what appears to be primary care in order for further management of his wounds. He also sees physical medicine and rehabilitation. I did receive a lot of the notes from there as far as what is been going on with regard to this gentleman. Patient is a 26 year old male who had an anoxic encephalopathy, end-stage renal disease on hemodialysis, he has been disabled with generalized weakness and subsequently developed a sacral decubitus ulcer which apparently was present before he went into the hospital according to what is not tells me but worsened during the time in the ICU simply because he was unable to be  turned with all the equipment that he was hooked up  to. Again it sounds like they did what they could to try to keep this under control but nonetheless due to the severity of his illness it sounds as if he is lucky to be alive to be perfectly honest. Subsequently the patient also does have diabetes mellitus, hypertension, nicotine dependence, he did go to the emergency department on 07/05/2021 due to a "abscess" in the gluteal region. The family member had seen this and wanted him to be checked out. During that time medical staff wanted to admit the patient but he refused. On 07/06/2021 the patient was subsequently discharged from the hospital [facility] and apparently they were trying to get in touch according to the notes with his aunt who is the legal guardian but were not able to. That was around 12:37 AM. Nonetheless in the interim since they have been basically at home using Santyl which was the recommendation from the hospital. This has been on all locations. Dry dressings have been applied following. Unfortunately this just does not seem to be doing quite enough especially for the sacral area which is a significant wound to be honest. We do not have any sign of an x-ray that was done of the sacral area nor that they know of anything in particular that was done at the hospital. With all that being said I think an x-ray would be a good idea based on what I am seeing currently this does appear to be quite significant from a wound care perspective. 3/14; patient presents for follow-up. He has no issues or complaints today. He has been using Dakin's wet-to-dry dressings to the wound beds. He denies signs of infection. He reports Currently taking Augmentin. 08/08/2021 upon evaluation today patient actually appears to be doing better in regard to his wounds in the sacral region as well as the knee both of which are showing signs of excellent improvement. In regard to his foot I am not certain  exactly what his blood flow is at this point. That something but I am going to spend a little bit of time looking into the chart regarding. Fortunately I do not see any signs of infection but I do believe he is probably can need some sharp debridement here but I do not want to perform this until we can confirm good blood flow. I know we were able to do this first visit because he was in the bed in the room where we can get him in position. We also were not able to do at last visit as we did not end up actually seeing him due to some incontinence issues. Nonetheless I think that we need to plan for ABIs at the next visit. 08-17-2021 upon evaluation today patient appears to be doing well with regard to his sacral wound as well as the knee the knee is completely healed. In regard to the foot he does have need for sharp debridement today. I am going to do that he is noncompressible but nonetheless has good pulses and good capillary refill there is no evidence of compromise here although he does have a lot of necrotic tissue we need to help clean away here. 08-24-2021 upon evaluation today patient's sacral region is actually doing quite well. Unfortunately the left lateral foot area is not doing nearly as well there is a lot of necrotic tissue here including necrotic muscle which I would have to attempt to remove today. Overall I do feel like that the patient is showing some signs of improvement  were still holding as far as his insurance is concerned and the MRI being scheduled apparently we do not have documentation of insurance that will cover for the MRI and therefore they are not scheduling it at this point. Nonetheless his aunt does need to figure this out as soon as possible as we really do need to look into getting this MRI done ASAP with regard to the left foot. 08-31-2021 upon evaluation today patient appears to be doing better in regard to his foot ulcer though he still has a significant amount of  necrotic tissue. Apparently during the conversation today it became apparent that he has not even taken the Augmentin I previously prescribed for him. Again his x-ray did show evidence of potential for osteomyelitis but we had ordered an MRI but at the same time he was unable to get this as apparently he does not have insurance that covers anything that we are actually investigating and doing at this point. It was going to be close to $700 and it appears that he and the family have decided not to do this. Nonetheless he is going to need antibiotic treatment and really for at least 6 weeks based on what we are seeing. It looks like Omnicef should be appropriate based on the culture results from last week he is also going require some debridement today 4/27; large wound on the left lateral foot. Undermining but about the same in terms of measurements as last week. According to our intake nurse condition of the wound bed looks better. Similarly the area on the sacrum seems to be filling and inferiorly although there is still under mining at 12:00. Using Dakin's wet-to-dry 5/10; patient presents for follow-up. He has no issues or complaints today. He states he has some days left to finish his antibiotic course. He has been using Dakin's wet-to-dry dressings. He denies signs of infection. 10-05-2021 upon evaluation today patient appears to be doing well with regard to his wounds. He has been tolerating the dressing changes without complication. Fortunately I do not see any evidence of infection locally or systemically which is great news. No fevers, chills, nausea, vomiting, or diarrhea. Jonathan Mcmillan, Jonathan Mcmillan. ([id number]) Objective Constitutional Well-nourished and well-hydrated in no acute distress. Vitals Time Taken: 1:29 PM, Height: 68 in, Weight: 146 lbs, BMI: 22.2, Temperature: 97.9 F, Pulse: 84 bpm, Respiratory Rate: 18 breaths/min, Blood Pressure: 150/95 mmHg. Respiratory normal breathing without  difficulty. Psychiatric this patient is able to make decisions and demonstrates good insight into disease process. Alert and Oriented x 3. pleasant and cooperative. General Notes: Patient's wounds again are showing signs of excellent granulation epithelization overall feel like were definitely headed in the right direction and both wounds are showing signs of significant improvement and very pleased with the progress here. The biggest issue there was some maceration his dressings need to be changed every day although they have been changed since Tuesday this is Thursday due to the fact he was coming here he was not sure they needed to be done. Integumentary (Hair, Skin) Wound #1 status is Open. Original cause of wound was Gradually Appeared. The date acquired was: 05/31/2021. The wound has been in treatment 11 weeks. The wound is located on the Sacrum. The wound measures 1.7cm length x 0.5cm width x 0.4cm depth; 0.668cm^2 area and 0.267cm^3 volume. There is bone, muscle, and Fat Layer (Subcutaneous Tissue) exposed. There is no tunneling or undermining noted. There is a medium amount of serosanguineous drainage noted. Foul odor after cleansing  was noted. There is medium (34-66%) pink granulation within the wound bed. There is a medium (34-66%) amount of necrotic tissue within the wound bed. Wound #2 status is Open. Original cause of wound was Gradually Appeared. The date acquired was: 05/31/2021. The wound has been in treatment 11 weeks. The wound is located on the Left,Lateral Foot. The wound measures 1.5cm length x 4.5cm width x 1cm depth; 5.301cm^2 area and 5.301cm^3 volume. There is bone and muscle exposed. There is no tunneling or undermining noted. There is a medium amount of serosanguineous drainage noted. Foul odor after cleansing was noted. There is large (67-100%) red, hyper - granulation within the wound bed. There is a small (1-33%) amount of necrotic tissue within the wound bed including  Adherent Slough. 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 There was a Excisional Skin/Subcutaneous Tissue Debridement with a total area of 3 sq cm performed by Tommie Sams., PA-C. With the following instrument(s): Curette to remove Viable and Non-Viable tissue/material. Material removed includes Callus, Subcutaneous Tissue, Slough, and Biofilm. No specimens were taken. A time out was conducted at 13:35, prior to the start of the procedure. A Moderate amount of bleeding was controlled with Pressure. The procedure was tolerated well with a pain level of 0 throughout and a pain level of 0 following the procedure. Post Debridement Measurements: 1.5cm length x 4.5cm width x 1cm depth; 5.301cm^3 volume. Post debridement Stage noted as Category/Stage IV. Character of Wound/Ulcer Post Debridement is improved. Post procedure Diagnosis Wound #2: Same as Pre-Procedure Jonathan Mcmillan, Jonathan Mcmillan ([id number]) Plan Follow-up Appointments: Return Appointment in 2 weeks. Bathing/ Shower/ Hygiene: May shower; gently cleanse wound with antibacterial soap, rinse and pat dry prior to dressing wounds WOUND #1: - Sacrum Wound Laterality: Primary Dressing: Gauze 1 x Per Day/30 Days Discharge Instructions: moistened with Dakins Solution 1/4 strngth Secondary Dressing: ABD Pad 5x9 (in/in) (Generic) 1 x Per Day/30 Days Discharge Instructions: Cover with ABD pad Secured With: Medipore Tape - 86M Medipore H Soft Cloth Surgical Tape, 2x2 (in/yd) (Generic) 1 x Per Day/30 Days WOUND #2: - Foot Wound Laterality: Left, Lateral Primary Dressing: Gauze 1 x Per Day/30 Days Discharge Instructions: moistened with Dakins  Solution 1/4 strngth Secondary Dressing: ABD Pad 5x9 (in/in) (Generic) 1 x Per Day/30 Days Discharge Instructions: Cover with ABD pad Secured With: Medipore Tape - 86M Medipore H Soft Cloth Surgical Tape, 2x2 (in/yd) (Generic) 1 x Per Day/30 Days 1. I am get a recommend currently that we going continue with the wound care measures as before and the patient is in agreement with plan. This includes the use of the Dakin's moistened gauze dressing which I think is doing a great job. 2. We will continue with ABD pad to cover followed by roll gauze to secure in place. 3. I would also recommend appropriate offloading I think this is still of utmost importance. We will see patient back for reevaluation in 1 week here in the clinic. If anything worsens or changes patient will contact our office for additional recommendations. Electronic Signature(s) Signed: 10/05/2021 5:22:14 PM By: Worthy Keeler PA-C Entered By: Worthy Keeler on 10/05/2021 17:22:14 Jonathan Mcmillan ([id number]) -------------------------------------------------------------------------------- SuperBill Details Patient Name: Jonathan Mcmillan. Date of Service: 10/05/2021 Medical  Record Number: [id number] Patient Account Number: [id number] Date of Birth/Sex: 12/14/1995 (25 y.o. 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

## 2021-10-05 NOTE — Progress Notes (Signed)
SAXON, BARICH ([id number]) Visit Report for 10/05/2021 Arrival Information Details Patient Name: Jonathan Mcmillan, Jonathan Mcmillan. Date of Service: 10/05/2021 1:15 PM Medical Record Number: [id number] Patient Account Number: [id number] Date of Birth/Sex: 1995/08/26 (26 y.o. M) Treating RN: Carlene Coria Primary Care Rasheen Bells: Denton Lank Other Clinician: Referring Merdith Adan: Denton Lank Treating Aleta Manternach/Extender: Skipper Cliche in Treatment: 11 Visit Information History Since Last Visit All ordered tests and consults were completed: No Patient Arrived: Ambulatory Added or deleted any medications: No Arrival Time: 13:24 Any new allergies or adverse reactions: No Accompanied By: self Had a fall or experienced change in No Transfer Assistance: None activities of daily living that may affect Patient Identification Verified: Yes risk of falls: Secondary Verification Process Completed: Yes Signs or symptoms of abuse/neglect since last visito No Patient Requires Transmission-Based No Hospitalized since last visit: No Precautions: Implantable device outside of the clinic excluding No Patient Has Alerts: Yes cellular tissue based products placed in the center Patient Alerts: Type 2 diabetic since last visit: NON Has Dressing in Place as Prescribed: Yes COMPRESSABLE Pain Present Now: No Electronic Signature(s) Signed: 10/05/2021 2:19:12 PM By: Carlene Coria RN Entered By: Carlene Coria on 10/05/2021 13:29:13 Jonathan Mcmillan ([id number]) -------------------------------------------------------------------------------- Clinic Level of Care Assessment Details Patient Name: Jonathan Mcmillan. Date of Service: 10/05/2021 1:15 PM Medical Record Number: [id number] Patient Account Number: [id number] Date of Birth/Sex: Jan 26, 1996 (26 y.o. M) Treating RN: Carlene Coria Primary Care Jaison Petraglia: Denton Lank Other Clinician: Referring Shalana Jardin: Denton Lank Treating Nesreen Albano/Extender: Skipper Cliche in  Treatment: 11 Clinic Level of Care Assessment Items TOOL 1 Quantity Score []  - Use when EandM and Procedure is performed on INITIAL visit 0 ASSESSMENTS - Nursing Assessment / Reassessment []  - General Physical Exam (combine w/ comprehensive assessment (listed just below) when performed on new 0 pt. evals) []  - 0 Comprehensive Assessment (HX, ROS, Risk Assessments, Wounds Hx, etc.) ASSESSMENTS - Wound and Skin Assessment / Reassessment []  - Dermatologic / Skin Assessment (not related to wound area) 0 ASSESSMENTS - Ostomy and/or Continence Assessment and Care []  - Incontinence Assessment and Management 0 []  - 0 Ostomy Care Assessment and Management (repouching, etc.) PROCESS - Coordination of Care []  - Simple Patient / Family Education for ongoing care 0 []  - 0 Complex (extensive) Patient / Family Education for ongoing care []  - 0 Staff obtains Programmer, systems, Records, Test Results / Process Orders []  - 0 Staff telephones HHA, Nursing Homes / Clarify orders / etc []  - 0 Routine Transfer to another Facility (non-emergent condition) []  - 0 Routine Hospital Admission (non-emergent condition) []  - 0 New Admissions / Biomedical engineer / Ordering NPWT, Apligraf, etc. []  - 0 Emergency Hospital Admission (emergent condition) PROCESS - Special Needs []  - Pediatric / Minor Patient Management 0 []  - 0 Isolation Patient Management []  - 0 Hearing / Language / Visual special needs []  - 0 Assessment of Community assistance (transportation, D/C planning, etc.) []  - 0 Additional assistance / Altered mentation []  - 0 Support Surface(s) Assessment (bed, cushion, seat, etc.) INTERVENTIONS - Miscellaneous []  - External ear exam 0 []  - 0 Patient Transfer (multiple staff / Civil Service fast streamer / Similar devices) []  - 0 Simple Staple / Suture removal (25 or less) []  - 0 Complex Staple / Suture removal (26 or more) []  - 0 Hypo/Hyperglycemic Management (do not check if billed separately) []  -  0 Ankle / Brachial Index (ABI) - do not check if billed separately Has the patient been seen at the hospital within the last  three years: Yes Total Score: 0 Level Of Care: ____ Jonathan Mcmillan ([id number]) Electronic Signature(s) Signed: 10/05/2021 2:19:12 PM By: Carlene Coria RN Entered By: Carlene Coria on 10/05/2021 13:41:59 Jonathan Mcmillan ([id number]) -------------------------------------------------------------------------------- Encounter Discharge Information Details Patient Name: Jonathan Mcmillan, Jonathan Mcmillan. Date of Service: 10/05/2021 1:15 PM Medical Record Number: [id number] Patient Account Number: [id number] Date of Birth/Sex: 08-Sep-1995 (26 y.o. M) Treating RN: Carlene Coria Primary Care Enzo Treu: Denton Lank Other Clinician: Referring Dacia Capers: Denton Lank Treating Sayyid Harewood/Extender: Skipper Cliche in Treatment: 11 Encounter Discharge Information Items Post Procedure Vitals Discharge Condition: Stable Temperature (F): 97.9 Ambulatory Status: Ambulatory Pulse (bpm): 84 Discharge Destination: Home Respiratory Rate (breaths/min): 18 Transportation: Private Auto Blood Pressure (mmHg): 150/95 Accompanied By: self Schedule Follow-up Appointment: Yes Clinical Summary of Care: Electronic Signature(s) Signed: 10/05/2021 2:19:12 PM By: Carlene Coria RN Entered By: Carlene Coria on 10/05/2021 13:42:57 Jonathan Mcmillan ([id number]) -------------------------------------------------------------------------------- Lower Extremity Assessment Details Patient Name: Jonathan Mcmillan. Date of Service: 10/05/2021 1:15 PM Medical Record Number: [id number] Patient Account Number: [id number] Date of Birth/Sex: 1995/12/15 (26 y.o. M) Treating RN: Carlene Coria Primary Care Lucyle Alumbaugh: Denton Lank Other Clinician: Referring Aayliah Rotenberry: Denton Lank Treating Eisa Necaise/Extender: Jeri Cos Weeks in Treatment: 11 Electronic Signature(s) Signed: 10/05/2021 2:19:12 PM By: Carlene Coria RN Entered By:  Carlene Coria on 10/05/2021 13:35:19 Jonathan Mcmillan ([id number]) -------------------------------------------------------------------------------- Multi Wound Chart Details Patient Name: Jonathan Mcmillan, Jonathan Mcmillan. Date of Service: 10/05/2021 1:15 PM Medical Record Number: [id number] Patient Account Number: [id number] Date of Birth/Sex: 01-03-1996 (26 y.o. M) Treating RN: Carlene Coria Primary Care Keyandra Swenson: Denton Lank Other Clinician: Referring Cuyler Vandyken: Denton Lank Treating Malak Duchesneau/Extender: Jeri Cos Weeks in Treatment: 11 Vital Signs Height(in): 64 Pulse(bpm): 65 Weight(lbs): 146 Blood Pressure(mmHg): 150/95 Body Mass Index(BMI): 22.2 Temperature(F): 97.9 Respiratory Rate(breaths/min): 18 Photos: [N/A:N/A] Wound Location: Sacrum Left, Lateral Foot N/A Wounding Event: Gradually Appeared Gradually Appeared N/A Primary Etiology: Pressure Ulcer Pressure Ulcer N/A Comorbid History: Hypertension, Type I Diabetes, End Hypertension, Type I Diabetes, End N/A Stage Renal Disease, Neuropathy Stage Renal Disease, Neuropathy Date Acquired: 05/31/2021 05/31/2021 N/A Weeks of Treatment: 11 11 N/A Wound Status: Open Open N/A Wound Recurrence: No No N/A Measurements L x W x D (cm) 1.7x0.5x0.4 1.5x4.5x1 N/A Area (cm) : 0.668 5.301 N/A Volume (cm) : 0.267 5.301 N/A % Reduction in Area: 97.70% 32.50% N/A % Reduction in Volume: 99.60% -237.40% N/A Classification: Category/Stage IV Category/Stage IV N/A Exudate Amount: Medium Medium N/A Exudate Type: Serosanguineous Serosanguineous N/A Exudate Color: red, brown red, brown N/A Foul Odor After Cleansing: Yes Yes N/A Odor Anticipated Due to Product No No N/A Use: Granulation Amount: Medium (34-66%) Large (67-100%) N/A Granulation Quality: Pink Red, Hyper-granulation N/A Necrotic Amount: Medium (34-66%) Small (1-33%) N/A Exposed Structures: Fat Layer (Subcutaneous Tissue): Muscle: Yes N/A Yes Bone: Yes Muscle: Yes Fascia: No Bone: Yes Fat  Layer (Subcutaneous Tissue): Fascia: No No Tendon: No Tendon: No Joint: No Joint: No Epithelialization: None None N/A Treatment Notes Electronic Signature(s) Signed: 10/05/2021 2:19:12 PM By: Carlene Coria RN Entered By: Carlene Coria on 10/05/2021 13:35:33 Jonathan Mcmillan ([id number]) Jonathan Mcmillan, Jonathan Mcmillan ([id number]) -------------------------------------------------------------------------------- [place] Details Patient Name: Jonathan Mcmillan, Jonathan Mcmillan. Date of Service: 10/05/2021 1:15 PM Medical Record Number: [id number] Patient Account Number: [id number] Date of Birth/Sex: [birth year] (26 y.o. M) Treating RN: Carlene Coria Primary Care Kaliyah Gladman: Denton Lank Other Clinician: Referring Rylynn Kobs: Denton Lank Treating Laiya Wisby/Extender: Skipper Cliche in Treatment: 11 Active Inactive Necrotic Tissue Nursing Diagnoses: Impaired tissue integrity related to necrotic/devitalized tissue Knowledge deficit related to  management of necrotic/devitalized tissue Goals: Necrotic/devitalized tissue will be minimized in the wound bed Date Initiated: 08/31/2021 Target Resolution Date: 08/31/2021 Goal Status: Active Patient/caregiver will verbalize understanding of reason and process for debridement of necrotic tissue Date Initiated: 08/31/2021 Target Resolution Date: 08/31/2021 Goal Status: Active Interventions: Assess patient pain level pre-, during and post procedure and prior to discharge Provide education on necrotic tissue and debridement process Treatment Activities: Excisional debridement : 08/31/2021 Notes: Soft Tissue Infection Nursing Diagnoses: Impaired tissue integrity Knowledge deficit related to disease process and management Knowledge deficit related to home infection control: handwashing, handling of soiled dressings, supply storage Potential for infection: soft tissue Goals: Patient will remain free of wound infection Date Initiated: 08/31/2021 Target Resolution  Date: 08/31/2021 Goal Status: Active Patient/caregiver will verbalize understanding of or measures to prevent infection and contamination in the home setting Date Initiated: 08/31/2021 Target Resolution Date: 08/31/2021 Goal Status: Active Patient's soft tissue infection will resolve Date Initiated: 08/31/2021 Target Resolution Date: 08/31/2021 Goal Status: Active Signs and symptoms of infection will be recognized early to allow for prompt treatment Date Initiated: 08/31/2021 Target Resolution Date: 08/31/2021 Goal Status: Active Interventions: Assess signs and symptoms of infection every visit Provide education on infection Treatment Activities: Culture and sensitivity : 08/31/2021 Education provided on Infection : 08/31/2021 Systemic antibiotics : 08/31/2021 Jonathan Mcmillan, Jonathan Mcmillan ([id number]) Notes: Wound/Skin Impairment Nursing Diagnoses: Knowledge deficit related to ulceration/compromised skin integrity Goals: Patient/caregiver will verbalize understanding of skin care regimen Date Initiated: 07/14/2021 Target Resolution Date: 08/14/2021 Goal Status: Active Ulcer/skin breakdown will have a volume reduction of 30% by week 4 Date Initiated: 07/14/2021 Target Resolution Date: 09/13/2021 Goal Status: Active Ulcer/skin breakdown will have a volume reduction of 50% by week 8 Date Initiated: 07/14/2021 Target Resolution Date: 10/14/2021 Goal Status: Active Ulcer/skin breakdown will have a volume reduction of 80% by week 12 Date Initiated: 07/14/2021 Target Resolution Date: 11/13/2021 Goal Status: Active Ulcer/skin breakdown will heal within 14 weeks Date Initiated: 07/14/2021 Target Resolution Date: 01/03/2022 Goal Status: Active Interventions: Assess patient/caregiver ability to obtain necessary supplies Assess patient/caregiver ability to perform ulcer/skin care regimen upon admission and as needed Assess ulceration(s) every visit Notes: Electronic Signature(s) Signed: 10/05/2021 2:19:12 PM By:  Carlene Coria RN Entered By: Carlene Coria on 10/05/2021 13:35:26 Jonathan Mcmillan ([id number]) -------------------------------------------------------------------------------- Pain Assessment Details Patient Name: Jonathan Mcmillan. Date of Service: 10/05/2021 1:15 PM Medical Record Number: [id number] Patient Account Number: [id number] Date of Birth/Sex: 1995/08/01 (26 y.o. M) Treating RN: Carlene Coria Primary Care Annasofia Pohl: Denton Lank Other Clinician: Referring Taylon Coole: Denton Lank Treating Tavonna Worthington/Extender: Skipper Cliche in Treatment: 11 Active Problems Location of Pain Severity and Description of Pain Patient Has Paino No Site Locations Pain Management and Medication Current Pain Management: Electronic Signature(s) Signed: 10/05/2021 2:19:12 PM By: Carlene Coria RN Entered By: Carlene Coria on 10/05/2021 13:29:36 Jonathan Mcmillan ([id number]) -------------------------------------------------------------------------------- Patient/Caregiver Education Details Patient Name: Jonathan Mcmillan. Date of Service: 10/05/2021 1:15 PM Medical Record Number: [id number] Patient Account Number: [id number] Date of Birth/Gender: 1995-10-27 (26 y.o. M) Treating RN: Carlene Coria Primary Care Physician: Denton Lank Other Clinician: Referring Physician: Denton Lank Treating Physician/Extender: Skipper Cliche in Treatment: 11 Education Assessment Education Provided To: Patient Education Topics Provided Wound Debridement: Methods: Explain/Verbal Responses: State content correctly Electronic Signature(s) Signed: 10/05/2021 2:19:12 PM By: Carlene Coria RN Entered By: Carlene Coria on 10/05/2021 13:42:23 Jonathan Mcmillan ([id number]) -------------------------------------------------------------------------------- Wound Assessment Details Patient Name: Jonathan Mcmillan. Date of Service: 10/05/2021 1:15 PM Medical Record Number: [id number] Patient  Account Number: [id number] Date of  Birth/Sex: 12-May-1996 (26 y.o. M) Treating RN: Carlene Coria Primary Care Dreshon Proffit: Denton Lank Other Clinician: Referring Winthrop Shannahan: Denton Lank Treating Donterius Filley/Extender: Jeri Cos Weeks in Treatment: 11 Wound Status Wound Number: 1 Primary Pressure Ulcer Etiology: Wound Location: Sacrum Wound Status: Open Wounding Event: Gradually Appeared Comorbid Hypertension, Type I Diabetes, End Stage Renal Date Acquired: 05/31/2021 History: Disease, Neuropathy Weeks Of Treatment: 11 Clustered Wound: No Photos Wound Measurements Length: (cm) 1.7 % Reduc Width: (cm) 0.5 % Reduc Depth: (cm) 0.4 Epithel Area: (cm) 0.668 Tunnel Volume: (cm) 0.267 Underm tion in Area: 97.7% tion in Volume: 99.6% ialization: None ing: No ining: No Wound Description Classification: Category/Stage IV Foul Od Exudate Amount: Medium Due to Exudate Type: Serosanguineous Slough/ Exudate Color: red, brown or After Cleansing: Yes Product Use: No Fibrino Yes Wound Bed Granulation Amount: Medium (34-66%) Exposed Structure Granulation Quality: Pink Fascia Exposed: No Necrotic Amount: Medium (34-66%) Fat Layer (Subcutaneous Tissue) Exposed: Yes Tendon Exposed: No Muscle Exposed: Yes Necrosis of Muscle: No Joint Exposed: No Bone Exposed: Yes Treatment Notes Wound #1 (Sacrum) Cleanser Peri-Wound Care Topical Jonathan Mcmillan, Jonathan Mcmillan ([id number]) Primary Dressing Gauze Discharge Instruction: moistened with Dakins Solution 1/4 strngth Secondary Dressing ABD Pad 5x9 (in/in) Discharge Instruction: Cover with ABD pad Secured With Medipore Tape - 17M Medipore H Soft Cloth Surgical Tape, 2x2 (in/yd) Compression Wrap Compression Stockings Add-Ons Electronic Signature(s) Signed: 10/05/2021 2:19:12 PM By: Carlene Coria RN Entered By: Carlene Coria on 10/05/2021 13:34:55 Jonathan Mcmillan ([id number]) -------------------------------------------------------------------------------- Wound Assessment Details Patient  Name: Jonathan Mcmillan. Date of Service: 10/05/2021 1:15 PM Medical Record Number: [id number] Patient Account Number: [id number] Date of Birth/Sex: 27-Nov-1995 (26 y.o. M) Treating RN: Carlene Coria Primary Care Leevi Cullars: Denton Lank Other Clinician: Referring Cady Hafen: Denton Lank Treating Maranda Marte/Extender: Jeri Cos Weeks in Treatment: 11 Wound Status Wound Number: 2 Primary Pressure Ulcer Etiology: Wound Location: Left, Lateral Foot Wound Status: Open Wounding Event: Gradually Appeared Comorbid Hypertension, Type I Diabetes, End Stage Renal Date Acquired: 05/31/2021 History: Disease, Neuropathy Weeks Of Treatment: 11 Clustered Wound: No Photos Wound Measurements Length: (cm) 1.5 % Redu Width: (cm) 4.5 % Redu Depth: (cm) 1 Epithe Area: (cm) 5.301 Tunne Volume: (cm) 5.301 Under ction in Area: 32.5% ction in Volume: -237.4% lialization: None ling: No mining: No Wound Description Classification: Category/Stage IV Foul O Exudate Amount: Medium Due to Exudate Type: Serosanguineous Slough Exudate Color: red, brown dor After Cleansing: Yes Product Use: No /Fibrino Yes Wound Bed Granulation Amount: Large (67-100%) Exposed Structure Granulation Quality: Red, Hyper-granulation Fascia Exposed: No Necrotic Amount: Small (1-33%) Fat Layer (Subcutaneous Tissue) Exposed: No Necrotic Quality: Adherent Slough Tendon Exposed: No Muscle Exposed: Yes Necrosis of Muscle: No Joint Exposed: No Bone Exposed: Yes Treatment Notes Wound #2 (Foot) Wound Laterality: Left, Lateral Cleanser Peri-Wound Care Topical Jonathan Mcmillan, Jonathan Mcmillan ([id number]) Primary Dressing Gauze Discharge Instruction: moistened with Dakins Solution 1/4 strngth Secondary Dressing ABD Pad 5x9 (in/in) Discharge Instruction: Cover with ABD pad Secured With Medipore Tape - 17M Medipore H Soft Cloth Surgical Tape, 2x2 (in/yd) Compression Wrap Compression Stockings Add-Ons Electronic Signature(s) Signed:  10/05/2021 2:19:12 PM By: Carlene Coria RN Entered By: Carlene Coria on 10/05/2021 13:35:09 Jonathan Mcmillan ([id number]) -------------------------------------------------------------------------------- Vitals Details Patient Name: Jonathan Mcmillan. Date of Service: 10/05/2021 1:15 PM Medical Record Number: [id number] Patient Account Number: [id number] Date of Birth/Sex: 22-Oct-1995 (26 y.o. M) Treating RN: Carlene Coria Primary Care Layia Walla: Denton Lank Other Clinician: Referring Kainoa Swoboda: Denton Lank Treating Chin Wachter/Extender: Jeri Cos Weeks in Treatment: [phone]  Vital Signs Time Taken: 13:29 Temperature (F): 97.9 Height (in): 68 Pulse (bpm): 84 Weight (lbs): 146 Respiratory Rate (breaths/min): 18 Body Mass Index (BMI): 22.2 Blood Pressure (mmHg): 150/95 Reference Range: 80 - 120 mg / dl Electronic Signature(s) Signed: 10/05/2021 2:19:12 PM By: Carlene Coria RN Entered By: Carlene Coria on 10/05/2021 13:29:31

## 2021-10-19 ENCOUNTER — Encounter: Payer: Medicare Other | Attending: Physician Assistant | Admitting: Physician Assistant

## 2021-10-19 DIAGNOSIS — I120 Hypertensive chronic kidney disease with stage 5 chronic kidney disease or end stage renal disease: Secondary | ICD-10-CM | POA: Insufficient documentation

## 2021-10-19 DIAGNOSIS — E1040 Type 1 diabetes mellitus with diabetic neuropathy, unspecified: Secondary | ICD-10-CM | POA: Insufficient documentation

## 2021-10-19 DIAGNOSIS — L89890 Pressure ulcer of other site, unstageable: Secondary | ICD-10-CM | POA: Diagnosis not present

## 2021-10-19 DIAGNOSIS — G931 Anoxic brain damage, not elsewhere classified: Secondary | ICD-10-CM | POA: Diagnosis not present

## 2021-10-19 DIAGNOSIS — F17200 Nicotine dependence, unspecified, uncomplicated: Secondary | ICD-10-CM | POA: Diagnosis not present

## 2021-10-19 DIAGNOSIS — I2510 Atherosclerotic heart disease of native coronary artery without angina pectoris: Secondary | ICD-10-CM | POA: Insufficient documentation

## 2021-10-19 DIAGNOSIS — E1022 Type 1 diabetes mellitus with diabetic chronic kidney disease: Secondary | ICD-10-CM | POA: Diagnosis not present

## 2021-10-19 DIAGNOSIS — L89150 Pressure ulcer of sacral region, unstageable: Secondary | ICD-10-CM | POA: Insufficient documentation

## 2021-10-19 DIAGNOSIS — N186 End stage renal disease: Secondary | ICD-10-CM | POA: Diagnosis not present

## 2021-10-19 DIAGNOSIS — Z992 Dependence on renal dialysis: Secondary | ICD-10-CM | POA: Insufficient documentation

## 2021-10-19 NOTE — Progress Notes (Addendum)
Jonathan, Mcmillan ([id number]) Visit Report for 10/19/2021 Chief Complaint Document Details Patient Name: Jonathan Mcmillan, Jonathan Mcmillan. Date of Service: 10/19/2021 2:00 PM Medical Record Number: [id number] Patient Account Number: [id number] Date of Birth/Sex: July 05, 1995 (26 y.o. M) Treating RN: Carlene Coria Primary Care Provider: Denton Lank Other Clinician: Referring Provider: Denton Lank Treating Provider/Extender: Skipper Cliche in Treatment: 13 Information Obtained from: Patient Chief Complaint Sacral, left lateral foot, and right knee pressure ulcers Electronic Signature(s) Signed: 10/19/2021 2:29:09 PM By: Worthy Keeler PA-C Entered By: Worthy Keeler on 10/19/2021 14:29:09 Jonathan Mcmillan ([id number]) -------------------------------------------------------------------------------- Debridement Details Patient Name: Jonathan Mcmillan. Date of Service: 10/19/2021 2:00 PM Medical Record Number: [id number] Patient Account Number: [id number] Date of Birth/Sex: 1995-12-13 (26 y.o. M) Treating RN: Carlene Coria Primary Care Provider: Denton Lank Other Clinician: Referring Provider: Denton Lank Treating Provider/Extender: Skipper Cliche in Treatment: 13 Debridement Performed for Wound #2 Left,Lateral Foot Assessment: Performed By: Physician Tommie Sams., PA-C Debridement Type: Debridement Level of Consciousness (Pre- Awake and Alert procedure): Pre-procedure Verification/Time Out Yes - 14:30 Taken: Start Time: 14:30 Total Area Debrided (L x W): 0.5 (cm) x 1 (cm) = 0.5 (cm) Tissue and other material Viable, Non-Viable, Slough, Subcutaneous, Slough debrided: Level: Skin/Subcutaneous Tissue Debridement Description: Excisional Instrument: Curette Bleeding: Minimum Hemostasis Achieved: Pressure End Time: 14:34 Procedural Pain: 0 Post Procedural Pain: 0 Response to Treatment: Procedure was tolerated well Level of Consciousness (Post- Awake and Alert procedure): Post  Debridement Measurements of Total Wound Length: (cm) 1 Stage: Category/Stage IV Width: (cm) 4 Depth: (cm) 0.5 Volume: (cm) 1.571 Character of Wound/Ulcer Post Debridement: Improved Post Procedure Diagnosis Same as Pre-procedure Electronic Signature(s) Signed: 10/19/2021 4:11:43 PM By: Worthy Keeler PA-C Signed: 10/20/2021 9:45:44 AM By: Carlene Coria RN Entered By: Carlene Coria on 10/19/2021 14:34:58 Jonathan Mcmillan ([id number]) -------------------------------------------------------------------------------- HPI Details Patient Name: Jonathan Mcmillan. Date of Service: 10/19/2021 2:00 PM Medical Record Number: [id number] Patient Account Number: [id number] Date of Birth/Sex: 08/05/1995 (26 y.o. M) Treating RN: Carlene Coria Primary Care Provider: Denton Lank Other Clinician: Referring Provider: Denton Lank Treating Provider/Extender: Skipper Cliche in Treatment: 13 History of Present Illness HPI Description: 07/14/2021 patient presents today for initial inspection here in the clinic concerning an issue that he has been having with wounds over the left lateral foot, right knee, and the largest of these is the sacral region where there is significant necrotic tissue noted in the base of this wound. Subsequently the patient is referred to [place] from what appears to be primary care in order for further management of his wounds. He also sees physical medicine and rehabilitation. I did receive a lot of the notes from there as far as what is been going on with regard to this gentleman. Patient is a 26 year old male who had an anoxic encephalopathy, end-stage renal disease on hemodialysis, he has been disabled with generalized weakness and subsequently developed a sacral decubitus ulcer which apparently was present before he went into the hospital according to what is not tells me but worsened during the time in the ICU simply because he was unable to be turned with all the equipment that he was  hooked up to. Again it sounds like they did what they could to try to keep this under control but nonetheless due to the severity of his illness it sounds as if he is lucky to be alive to be perfectly honest. Subsequently the patient also does have diabetes mellitus, hypertension, nicotine dependence, he did go to  the emergency department on 07/05/2021 due to a "abscess" in the gluteal region. The family member had seen this and wanted him to be checked out. During that time medical staff wanted to admit the patient but he refused. On 07/06/2021 the patient was subsequently discharged from the hospital [place] and apparently they were trying to get in touch according to the notes with his aunt who is the legal guardian but were not able to. That was around 12:37 AM. Nonetheless in the interim since they have been basically at home using Santyl which was the recommendation from the hospital. This has been on all locations. Dry dressings have been applied following. Unfortunately this just does not seem to be doing quite enough especially for the sacral area which is a significant wound to be honest. We do not have any sign of an x-ray that was done of the sacral area nor that they know of anything in particular that was done at the hospital. With all that being said I think an x-ray would be a good idea based on what I am seeing currently this does appear to be quite significant from a wound care perspective. 3/14; patient presents for follow-up. He has no issues or complaints today. He has been using Dakin's wet-to-dry dressings to the wound beds. He denies signs of infection. He reports Currently taking Augmentin. 08/08/2021 upon evaluation today patient actually appears to be doing better in regard to his wounds in the sacral region as well as the knee both of which are showing signs of excellent improvement. In regard to his foot I am not certain exactly what his blood flow is at this  point. That something but I am going to spend a little bit of time looking into the chart regarding. Fortunately I do not see any signs of infection but I do believe he is probably can need some sharp debridement here but I do not want to perform this until we can confirm good blood flow. I know we were able to do this first visit because he was in the bed in the room where we can get him in position. We also were not able to do at last visit as we did not end up actually seeing him due to some incontinence issues. Nonetheless I think that we need to plan for ABIs at the next visit. 08-17-2021 upon evaluation today patient appears to be doing well with regard to his sacral wound as well as the knee the knee is completely healed. In regard to the foot he does have need for sharp debridement today. I am going to do that he is noncompressible but nonetheless has good pulses and good capillary refill there is no evidence of compromise here although he does have a lot of necrotic tissue we need to help clean away here. 08-24-2021 upon evaluation today patient's sacral region is actually doing quite well. Unfortunately the left lateral foot area is not doing nearly as well there is a lot of necrotic tissue here including necrotic muscle which I would have to attempt to remove today. Overall I do feel like that the patient is showing some signs of improvement were still holding as far as his insurance is concerned and the MRI being scheduled apparently we do not have documentation of insurance that will cover for the MRI and therefore they are not scheduling it at this point. Nonetheless his aunt does need to figure this out as soon as possible as we really  do need to look into getting this MRI done ASAP with regard to the left foot. 08-31-2021 upon evaluation today patient appears to be doing better in regard to his foot ulcer though he still has a significant amount of necrotic tissue. Apparently during the  conversation today it became apparent that he has not even taken the Augmentin I previously prescribed for him. Again his x-ray did show evidence of potential for osteomyelitis but we had ordered an MRI but at the same time he was unable to get this as apparently he does not have insurance that covers anything that we are actually investigating and doing at this point. It was going to be close to $700 and it appears that he and the family have decided not to do this. Nonetheless he is going to need antibiotic treatment and really for at least 6 weeks based on what we are seeing. It looks like Omnicef should be appropriate based on the culture results from last week he is also going require some debridement today 4/27; large wound on the left lateral foot. Undermining but about the same in terms of measurements as last week. According to our intake nurse condition of the wound bed looks better. Similarly the area on the sacrum seems to be filling and inferiorly although there is still under mining at 12:00. Using Dakin's wet-to-dry 5/10; patient presents for follow-up. He has no issues or complaints today. He states he has some days left to finish his antibiotic course. He has been using Dakin's wet-to-dry dressings. He denies signs of infection. 10-05-2021 upon evaluation today patient appears to be doing well with regard to his wounds. He has been tolerating the dressing changes without complication. Fortunately I do not see any evidence of infection locally or systemically which is great news. No fevers, chills, nausea, vomiting, or diarrhea. 10-19-2021 upon evaluation today patient appears to be doing well with regard to his wounds. Both are showing signs of improvement the one in the gluteal area is just a small slit almost completely closed. 1 in the lateral portion of his foot is much better and overall I am very pleased I think there is just a very tiny area this can require some sharp  debridement the remainder looks to be doing awesome. MUHAMMAD, VACCA ([id number]) Electronic Signature(s) Signed: 10/19/2021 2:48:07 PM By: Worthy Keeler PA-C Entered By: Worthy Keeler on 10/19/2021 14:48:06 AHMED, INNISS ([id number]) -------------------------------------------------------------------------------- Physical Exam Details Patient Name: RONDALE, NIES. Date of Service: 10/19/2021 2:00 PM Medical Record Number: [id number] Patient Account Number: [id number] Date of Birth/Sex: 1995/12/28 (25 y.o. M) Treating RN: Carlene Coria Primary Care Provider: Denton Lank Other Clinician: Referring Provider: Denton Lank Treating Provider/Extender: Jeri Cos Weeks in Treatment: 66 Constitutional Well-nourished and well-hydrated in no acute distress. Respiratory normal breathing without difficulty. Psychiatric this patient is able to make decisions and demonstrates good insight into disease process. Alert and Oriented x 3. pleasant and cooperative. Notes Upon inspection patient's wound bed showed signs of good granulation and epithelization at this point. I did have to perform a small debridement in regard to the lateral foot otherwise everything else seems to be doing quite well. Electronic Signature(s) Signed: 10/19/2021 2:48:25 PM By: Worthy Keeler PA-C Entered By: Worthy Keeler on 10/19/2021 14:48:25 Jonathan Mcmillan ([id number]) -------------------------------------------------------------------------------- Physician Orders Details Patient Name: LAQUINCY, EASTRIDGE. Date of Service: 10/19/2021 2:00 PM Medical Record Number: [id number] Patient Account Number: [id number] Date of Birth/Sex: 1996/05/12 (25 y.o. M) Treating RN:  Carlene Coria Primary Care Provider: Denton Lank Other Clinician: Referring Provider: Denton Lank Treating Provider/Extender: Skipper Cliche in Treatment: [phone] Verbal / Phone Orders: No Diagnosis Coding Follow-up Appointments o Return Appointment  in 2 weeks. Bathing/ Shower/ Hygiene o May shower; gently cleanse wound with antibacterial soap, rinse and pat dry prior to dressing wounds Wound Treatment Wound #1 - Sacrum Primary Dressing: Gauze 1 x Per Day/30 Days Discharge Instructions: moistened with Dakins Solution 1/4 strngth Secondary Dressing: ABD Pad 5x9 (in/in) (Generic) 1 x Per Day/30 Days Discharge Instructions: Cover with ABD pad Secured With: Medipore Tape - 61M Medipore H Soft Cloth Surgical Tape, 2x2 (in/yd) (Generic) 1 x Per Day/30 Days Wound #2 - Foot Wound Laterality: Left, Lateral Primary Dressing: Gauze 1 x Per Day/30 Days Discharge Instructions: moistened with Dakins Solution 1/4 strngth Secondary Dressing: ABD Pad 5x9 (in/in) (Generic) 1 x Per Day/30 Days Discharge Instructions: Cover with ABD pad Secured With: Medipore Tape - 61M Medipore H Soft Cloth Surgical Tape, 2x2 (in/yd) (Generic) 1 x Per Day/30 Days Electronic Signature(s) Signed: 10/19/2021 4:11:43 PM By: Worthy Keeler PA-C Signed: 10/20/2021 9:45:44 AM By: Carlene Coria RN Entered By: Carlene Coria on 10/19/2021 14:32:09 Jonathan Mcmillan ([id number]) -------------------------------------------------------------------------------- Problem List Details Patient Name: Jonathan Mcmillan. Date of Service: 10/19/2021 2:00 PM Medical Record Number: [id number] Patient Account Number: [id number] Date of Birth/Sex: 1995-09-27 (25 y.o. M) Treating RN: Carlene Coria Primary Care Provider: Denton Lank Other Clinician: Referring Provider: Denton Lank Treating Provider/Extender: Skipper Cliche in Treatment: 13 Active Problems ICD-10 Encounter Code Description Active Date MDM Diagnosis L89.154 Pressure ulcer of sacral region, stage 4 07/14/2021 No Yes L89.894 Pressure ulcer of other site, stage 4 07/14/2021 No Yes 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, not elsewhere classified 07/14/2021 No Yes I25.10 Atherosclerotic heart disease of native coronary artery without angina 07/14/2021  No Yes pectoris Inactive Problems Resolved Problems Electronic Signature(s) Signed: 10/19/2021 2:29:06 PM By: Worthy Keeler PA-C Entered By: Worthy Keeler on 10/19/2021 14:29:06 Jonathan Mcmillan ([id number]) -------------------------------------------------------------------------------- Progress Note Details Patient Name: Jonathan Mcmillan. Date of Service: 10/19/2021 2:00 PM Medical Record Number: [id number] Patient Account Number: [id number] Date of Birth/Sex: 11-22-1995 (25 y.o. M) Treating RN: Carlene Coria Primary Care Provider: Denton Lank Other Clinician: Referring Provider: Denton Lank Treating Provider/Extender: Skipper Cliche in Treatment: 13 Subjective Chief Complaint Information obtained from Patient Sacral, left lateral foot, and right knee pressure ulcers History of Present Illness (HPI) 07/14/2021 patient presents today for initial inspection here in the clinic concerning an issue that he has been having with wounds over the left lateral foot, right knee, and the largest of these is the sacral region where there is significant necrotic tissue noted in the base of this wound. Subsequently the patient is referred to [place] from what appears to be primary care in order for further management of his wounds. He also sees physical medicine and rehabilitation. I did receive a lot of the notes from there as far as what is been going on with regard to this gentleman. Patient is a 26 year old male who had an anoxic encephalopathy, end-stage renal disease on hemodialysis, he has been disabled with generalized weakness and subsequently developed a sacral decubitus ulcer which apparently was present before he went into the hospital according to what is not tells me but worsened during the time in the ICU simply because he was unable to be turned with all the equipment that he was hooked up to. Again it sounds like they did  what they could to try to keep this under control but nonetheless  due to the severity of his illness it sounds as if he is lucky to be alive to be perfectly honest. Subsequently the patient also does have diabetes mellitus, hypertension, nicotine dependence, he did go to the emergency department on 07/05/2021 due to a "abscess" in the gluteal region. The family member had seen this and wanted him to be checked out. During that time medical staff wanted to admit the patient but he refused. On 07/06/2021 the patient was subsequently discharged from the hospital [place] and apparently they were trying to get in touch according to the notes with his aunt who is the legal guardian but were not able to. That was around 12:37 AM. Nonetheless in the interim since they have been basically at home using Santyl which was the recommendation from the hospital. This has been on all locations. Dry dressings have been applied following. Unfortunately this just does not seem to be doing quite enough especially for the sacral area which is a significant wound to be honest. We do not have any sign of an x-ray that was done of the sacral area nor that they know of anything in particular that was done at the hospital. With all that being said I think an x-ray would be a good idea based on what I am seeing currently this does appear to be quite significant from a wound care perspective. 3/14; patient presents for follow-up. He has no issues or complaints today. He has been using Dakin's wet-to-dry dressings to the wound beds. He denies signs of infection. He reports Currently taking Augmentin. 08/08/2021 upon evaluation today patient actually appears to be doing better in regard to his wounds in the sacral region as well as the knee both of which are showing signs of excellent improvement. In regard to his foot I am not certain exactly what his blood flow is at this point. That something but I am going to spend a little bit of time looking into the chart regarding. Fortunately  I do not see any signs of infection but I do believe he is probably can need some sharp debridement here but I do not want to perform this until we can confirm good blood flow. I know we were able to do this first visit because he was in the bed in the room where we can get him in position. We also were not able to do at last visit as we did not end up actually seeing him due to some incontinence issues. Nonetheless I think that we need to plan for ABIs at the next visit. 08-17-2021 upon evaluation today patient appears to be doing well with regard to his sacral wound as well as the knee the knee is completely healed. In regard to the foot he does have need for sharp debridement today. I am going to do that he is noncompressible but nonetheless has good pulses and good capillary refill there is no evidence of compromise here although he does have a lot of necrotic tissue we need to help clean away here. 08-24-2021 upon evaluation today patient's sacral region is actually doing quite well. Unfortunately the left lateral foot area is not doing nearly as well there is a lot of necrotic tissue here including necrotic muscle which I would have to attempt to remove today. Overall I do feel like that the patient is showing some signs of improvement were still holding as far as  his insurance is concerned and the MRI being scheduled apparently we do not have documentation of insurance that will cover for the MRI and therefore they are not scheduling it at this point. Nonetheless his aunt does need to figure this out as soon as possible as we really do need to look into getting this MRI done ASAP with regard to the left foot. 08-31-2021 upon evaluation today patient appears to be doing better in regard to his foot ulcer though he still has a significant amount of necrotic tissue. Apparently during the conversation today it became apparent that he has not even taken the Augmentin I previously prescribed for  him. Again his x-ray did show evidence of potential for osteomyelitis but we had ordered an MRI but at the same time he was unable to get this as apparently he does not have insurance that covers anything that we are actually investigating and doing at this point. It was going to be close to $700 and it appears that he and the family have decided not to do this. Nonetheless he is going to need antibiotic treatment and really for at least 6 weeks based on what we are seeing. It looks like Omnicef should be appropriate based on the culture results from last week he is also going require some debridement today 4/27; large wound on the left lateral foot. Undermining but about the same in terms of measurements as last week. According to our intake nurse condition of the wound bed looks better. Similarly the area on the sacrum seems to be filling and inferiorly although there is still under mining at 12:00. Using Dakin's wet-to-dry 5/10; patient presents for follow-up. He has no issues or complaints today. He states he has some days left to finish his antibiotic course. He has been using Dakin's wet-to-dry dressings. He denies signs of infection. 10-05-2021 upon evaluation today patient appears to be doing well with regard to his wounds. He has been tolerating the dressing changes without complication. Fortunately I do not see any evidence of infection locally or systemically which is great news. No fevers, chills, nausea, vomiting, or diarrhea. 10-19-2021 upon evaluation today patient appears to be doing well with regard to his wounds. Both are showing signs of improvement the one in [place]. ([id number]) the gluteal area is just a small slit almost completely closed. 1 in the lateral portion of his foot is much better and overall I am very pleased I think there is just a very tiny area this can require some sharp debridement the remainder looks to be doing  awesome. Objective Constitutional Well-nourished and well-hydrated in no acute distress. Vitals Time Taken: 2:16 PM, Height: 68 in, Weight: 146 lbs, BMI: 22.2, Temperature: 98.1 F, Pulse: 94 bpm, Respiratory Rate: 18 breaths/min, Blood Pressure: 161/103 mmHg. Respiratory normal breathing without difficulty. Psychiatric this patient is able to make decisions and demonstrates good insight into disease process. Alert and Oriented x 3. pleasant and cooperative. General Notes: Upon inspection patient's wound bed showed signs of good granulation and epithelization at this point. I did have to perform a small debridement in regard to the lateral foot otherwise everything else seems to be doing quite well. Integumentary (Hair, Skin) Wound #1 status is Open. Original cause of wound was Gradually Appeared. The date acquired was: 05/31/2021. The wound has been in treatment 13 weeks. The wound is located on the Sacrum. The wound measures 1.5cm length x 2cm width x 0.7cm depth; 2.356cm^2 area and 1.649cm^3 volume. There is  bone, muscle, and Fat Layer (Subcutaneous Tissue) exposed. There is no tunneling or undermining noted. There is a medium amount of serosanguineous drainage noted. Foul odor after cleansing was noted. There is medium (34-66%) pink granulation within the wound bed. There is a medium (34-66%) amount of necrotic tissue within the wound bed. Wound #2 status is Open. Original cause of wound was Gradually Appeared. The date acquired was: 05/31/2021. The wound has been in treatment 13 weeks. The wound is located on the Left,Lateral Foot. The wound measures 1cm length x 4cm width x 0.5cm depth; 3.142cm^2 area and 1.571cm^3 volume. There is bone and muscle exposed. There is no tunneling or undermining noted. There is a medium amount of serosanguineous drainage noted. Foul odor after cleansing was noted. There is large (67-100%) red, hyper - granulation within the wound bed. There is a small (1-33%)  amount of necrotic tissue within the wound bed including Adherent Slough. 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 There was a Excisional Skin/Subcutaneous Tissue Debridement with a total area of 0.5 sq cm performed by Tommie Sams., PA-C. With the following instrument(s): Curette to remove Viable and Non-Viable tissue/material. Material removed includes Subcutaneous Tissue and Slough and. No specimens were taken. A time out was conducted at 14:30, prior to the start of the procedure. A Minimum amount of bleeding was controlled with Pressure. The procedure was tolerated well with a pain level of 0 throughout and a pain level of 0 following the procedure. Post Debridement Measurements: 1cm length x 4cm width x 0.5cm depth; 1.571cm^3 volume. Post debridement Stage noted as Category/Stage IV. Character of Wound/Ulcer Post Debridement is improved. Post procedure Diagnosis Wound #2: Same as Pre-Procedure JEDREK, DINOVO ([id number]) Plan Follow-up Appointments: Return Appointment in 2 weeks. Bathing/ Shower/ Hygiene: May shower; gently cleanse wound with antibacterial soap, rinse and pat dry prior to dressing wounds WOUND #1: - Sacrum Wound Laterality: Primary Dressing: Gauze 1 x Per Day/30 Days Discharge Instructions: moistened with Dakins Solution 1/4 strngth Secondary Dressing: ABD Pad 5x9 (in/in) (Generic) 1 x Per Day/30 Days Discharge Instructions: Cover with ABD pad Secured With: Medipore Tape - 74M Medipore H Soft Cloth Surgical Tape, 2x2 (in/yd) (Generic) 1 x Per Day/30 Days WOUND #2: - Foot Wound Laterality: Left, Lateral Primary Dressing: Gauze 1 x Per Day/30 Days Discharge  Instructions: moistened with Dakins Solution 1/4 strngth Secondary Dressing: ABD Pad 5x9 (in/in) (Generic) 1 x Per Day/30 Days Discharge Instructions: Cover with ABD pad Secured With: Medipore Tape - 74M Medipore H Soft Cloth Surgical Tape, 2x2 (in/yd) (Generic) 1 x Per Day/30 Days 1. I would recommend that we go ahead and continue with the wound care measures as before and the patient is in agreement with the plan this includes the use of the silver collagen to the sacral area this is new although we will still continue to cover this with the cover dressing. 2. I am also can recommend that we continue with the Dakin's moistened gauze dressing to the foot followed by an ABD pad this is looking awesome. We will see patient back for reevaluation in 2 weeks here in the clinic. If anything worsens or changes patient will contact our office for additional recommendations. Electronic Signature(s) Signed: 10/19/2021 2:49:11  PM By: Worthy Keeler PA-C Previous Signature: 10/19/2021 2:48:55 PM Version By: Worthy Keeler PA-C Entered By: Worthy Keeler on 10/19/2021 14:49:11 Jonathan Mcmillan ([id number]) -------------------------------------------------------------------------------- SuperBill Details Patient Name: Jonathan Mcmillan. Date of Service: 10/19/2021 Medical Record Number: [id number] Patient Account Number: [id number] Date of Birth/Sex: 04/19/1996 (25 y.o. 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

## 2021-10-20 NOTE — Progress Notes (Signed)
KOLIN, ERDAHL ([id number]) Visit Report for 10/19/2021 Arrival Information Details Patient Name: Jonathan Mcmillan, Jonathan Mcmillan. Date of Service: 10/19/2021 2:00 PM Medical Record Number: [id number] Patient Account Number: [id number] Date of Birth/Sex: 1995-10-13 (25 y.o. M) Treating RN: Carlene Coria Primary Care Delorise Hunkele: Denton Lank Other Clinician: Referring Kyrel Leighton: Denton Lank Treating Albirda Shiel/Extender: Skipper Cliche in Treatment: 46 Visit Information History Since Last Visit All ordered tests and consults were completed: No Patient Arrived: Ambulatory Added or deleted any medications: No Arrival Time: 14:12 Any new allergies or adverse reactions: No Accompanied By: self Had a fall or experienced change in No Transfer Assistance: None activities of daily living that may affect Patient Identification Verified: Yes risk of falls: Secondary Verification Process Completed: Yes Signs or symptoms of abuse/neglect since last visito No Patient Requires Transmission-Based No Hospitalized since last visit: No Precautions: Implantable device outside of the clinic excluding No Patient Has Alerts: Yes cellular tissue based products placed in the center Patient Alerts: Type 2 diabetic since last visit: NON Has Dressing in Place as Prescribed: Yes COMPRESSABLE Pain Present Now: No Electronic Signature(s) Signed: 10/20/2021 9:45:44 AM By: Carlene Coria RN Entered By: Carlene Coria on 10/19/2021 14:16:12 Jonathan Mcmillan ([id number]) -------------------------------------------------------------------------------- Clinic Level of Care Assessment Details Patient Name: Jonathan Mcmillan. Date of Service: 10/19/2021 2:00 PM Medical Record Number: [id number] Patient Account Number: [id number] Date of Birth/Sex: 03/20/1996 (25 y.o. M) Treating RN: Carlene Coria Primary Care Latoiya Maradiaga: Denton Lank Other Clinician: Referring Katrenia Alkins: Denton Lank Treating Cobey Raineri/Extender: Skipper Cliche in  Treatment: 13 Clinic Level of Care Assessment Items TOOL 4 Quantity Score []  - Use when only an EandM is performed on FOLLOW-UP visit 0 ASSESSMENTS - Nursing Assessment / Reassessment []  - Reassessment of Co-morbidities (includes updates in patient status) 0 []  - 0 Reassessment of Adherence to Treatment Plan ASSESSMENTS - Wound and Skin Assessment / Reassessment []  - Simple Wound Assessment / Reassessment - one wound 0 []  - 0 Complex Wound Assessment / Reassessment - multiple wounds []  - 0 Dermatologic / Skin Assessment (not related to wound area) ASSESSMENTS - Focused Assessment []  - Circumferential Edema Measurements - multi extremities 0 []  - 0 Nutritional Assessment / Counseling / Intervention []  - 0 Lower Extremity Assessment (monofilament, tuning fork, pulses) []  - 0 Peripheral Arterial Disease Assessment (using hand held doppler) ASSESSMENTS - Ostomy and/or Continence Assessment and Care []  - Incontinence Assessment and Management 0 []  - 0 Ostomy Care Assessment and Management (repouching, etc.) PROCESS - Coordination of Care []  - Simple Patient / Family Education for ongoing care 0 []  - 0 Complex (extensive) Patient / Family Education for ongoing care []  - 0 Staff obtains Programmer, systems, Records, Test Results / Process Orders []  - 0 Staff telephones HHA, Nursing Homes / Clarify orders / etc []  - 0 Routine Transfer to another Facility (non-emergent condition) []  - 0 Routine Hospital Admission (non-emergent condition) []  - 0 New Admissions / Biomedical engineer / Ordering NPWT, Apligraf, etc. []  - 0 Emergency Hospital Admission (emergent condition) []  - 0 Simple Discharge Coordination []  - 0 Complex (extensive) Discharge Coordination PROCESS - Special Needs []  - Pediatric / Minor Patient Management 0 []  - 0 Isolation Patient Management []  - 0 Hearing / Language / Visual special needs []  - 0 Assessment of Community assistance (transportation, D/C planning,  etc.) []  - 0 Additional assistance / Altered mentation []  - 0 Support Surface(s) Assessment (bed, cushion, seat, etc.) INTERVENTIONS - Wound Cleansing / Measurement Jonathan Mcmillan, Jonathan Mcmillan. ([id number]) []  - 0 Simple  Wound Cleansing - one wound []  - 0 Complex Wound Cleansing - multiple wounds []  - 0 Wound Imaging (photographs - any number of wounds) []  - 0 Wound Tracing (instead of photographs) []  - 0 Simple Wound Measurement - one wound []  - 0 Complex Wound Measurement - multiple wounds INTERVENTIONS - Wound Dressings []  - Small Wound Dressing one or multiple wounds 0 []  - 0 Medium Wound Dressing one or multiple wounds []  - 0 Large Wound Dressing one or multiple wounds []  - 0 Application of Medications - topical []  - 0 Application of Medications - injection INTERVENTIONS - Miscellaneous []  - External ear exam 0 []  - 0 Specimen Collection (cultures, biopsies, blood, body fluids, etc.) []  - 0 Specimen(s) / Culture(s) sent or taken to Lab for analysis []  - 0 Patient Transfer (multiple staff / Civil Service fast streamer / Similar devices) []  - 0 Simple Staple / Suture removal (25 or less) []  - 0 Complex Staple / Suture removal (26 or more) []  - 0 Hypo / Hyperglycemic Management (close monitor of Blood Glucose) []  - 0 Ankle / Brachial Index (ABI) - do not check if billed separately []  - 0 Vital Signs Has the patient been seen at the hospital within the last three years: Yes Total Score: 0 Level Of Care: ____ Electronic Signature(s) Signed: 10/20/2021 9:45:44 AM By: Carlene Coria RN Entered By: Carlene Coria on 10/19/2021 14:35:16 Jonathan Mcmillan ([id number]) -------------------------------------------------------------------------------- Encounter Discharge Information Details Patient Name: Jonathan Mcmillan. Date of Service: 10/19/2021 2:00 PM Medical Record Number: [id number] Patient Account Number: [id number] Date of Birth/Sex: 03/31/1996 (25 y.o. M) Treating RN: Carlene Coria Primary Care  Fred Hammes: Denton Lank Other Clinician: Referring Tavia Stave: Denton Lank Treating Shantil Vallejo/Extender: Skipper Cliche in Treatment: 13 Encounter Discharge Information Items Post Procedure Vitals Discharge Condition: Stable Temperature (F): 98.1 Ambulatory Status: Ambulatory Pulse (bpm): 94 Discharge Destination: Home Respiratory Rate (breaths/min): 18 Transportation: Private Auto Blood Pressure (mmHg): 161/103 Accompanied By: self Schedule Follow-up Appointment: Yes Clinical Summary of Care: Electronic Signature(s) Signed: 10/20/2021 9:45:44 AM By: Carlene Coria RN Entered By: Carlene Coria on 10/19/2021 14:36:16 Jonathan Mcmillan ([id number]) -------------------------------------------------------------------------------- Lower Extremity Assessment Details Patient Name: Jonathan Mcmillan. Date of Service: 10/19/2021 2:00 PM Medical Record Number: [id number] Patient Account Number: [id number] Date of Birth/Sex: Feb 07, 1996 (25 y.o. M) Treating RN: Carlene Coria Primary Care Tiffanie Blassingame: Denton Lank Other Clinician: Referring Bernetta Sutley: Denton Lank Treating Elainna Eshleman/Extender: Jeri Cos Weeks in Treatment: 13 Electronic Signature(s) Signed: 10/20/2021 9:45:44 AM By: Carlene Coria RN Entered By: Carlene Coria on 10/19/2021 14:26:58 Jonathan Mcmillan ([id number]) -------------------------------------------------------------------------------- Multi Wound Chart Details Patient Name: Jonathan Mcmillan. Date of Service: 10/19/2021 2:00 PM Medical Record Number: [id number] Patient Account Number: [id number] Date of Birth/Sex: Oct 08, 1995 (25 y.o. M) Treating RN: Carlene Coria Primary Care Muhamad Serano: Denton Lank Other Clinician: Referring Crystalina Stodghill: Denton Lank Treating Latrica Clowers/Extender: Skipper Cliche in Treatment: 13 Vital Signs Height(in): 13 Pulse(bpm): 31 Weight(lbs): 146 Blood Pressure(mmHg): 161/103 Body Mass Index(BMI): 22.2 Temperature(F): 98.1 Respiratory Rate(breaths/min):  18 Photos: [N/A:N/A] Wound Location: Sacrum Left, Lateral Foot N/A Wounding Event: Gradually Appeared Gradually Appeared N/A Primary Etiology: Pressure Ulcer Pressure Ulcer N/A Comorbid History: Hypertension, Type I Diabetes, End Hypertension, Type I Diabetes, End N/A Stage Renal Disease, Neuropathy Stage Renal Disease, Neuropathy Date Acquired: 05/31/2021 05/31/2021 N/A Weeks of Treatment: 13 13 N/A Wound Status: Open Open N/A Wound Recurrence: No No N/A Measurements L x W x D (cm) 1.5x2x0.7 1x4x0.5 N/A Area (cm) : 2.356 3.142 N/A Volume (cm) : 1.649 1.571 N/A %  Reduction in Area: 92.10% 60.00% N/A % Reduction in Volume: 97.80% 0.00% N/A Classification: Category/Stage IV Category/Stage IV N/A Exudate Amount: Medium Medium N/A Exudate Type: Serosanguineous Serosanguineous N/A Exudate Color: red, brown red, brown N/A Foul Odor After Cleansing: Yes Yes N/A Odor Anticipated Due to Product No No N/A Use: Granulation Amount: Medium (34-66%) Large (67-100%) N/A Granulation Quality: Pink Red, Hyper-granulation N/A Necrotic Amount: Medium (34-66%) Small (1-33%) N/A Exposed Structures: Fat Layer (Subcutaneous Tissue): Muscle: Yes N/A Yes Bone: Yes Muscle: Yes Fascia: No Bone: Yes Fat Layer (Subcutaneous Tissue): Fascia: No No Tendon: No Tendon: No Joint: No Joint: No Epithelialization: None None N/A Treatment Notes Electronic Signature(s) Signed: 10/20/2021 9:45:44 AM By: Carlene Coria RN Entered By: Carlene Coria on 10/19/2021 14:27:19 Jonathan Mcmillan ([id number]) Jonathan Mcmillan, Jonathan Mcmillan ([id number]) -------------------------------------------------------------------------------- [place] Details Patient Name: Jonathan Mcmillan, Jonathan Mcmillan. Date of Service: 10/19/2021 2:00 PM Medical Record Number: [id number] Patient Account Number: [id number] Date of Birth/Sex: 21-Dec-1995 (25 y.o. M) Treating RN: Carlene Coria Primary Care Kaamil Morefield: Denton Lank Other Clinician: Referring  Faun Mcqueen: Denton Lank Treating Latia Mataya/Extender: Skipper Cliche in Treatment: 13 Active Inactive Necrotic Tissue Nursing Diagnoses: Impaired tissue integrity related to necrotic/devitalized tissue Knowledge deficit related to management of necrotic/devitalized tissue Goals: Necrotic/devitalized tissue will be minimized in the wound bed Date Initiated: 08/31/2021 Target Resolution Date: 08/31/2021 Goal Status: Active Patient/caregiver will verbalize understanding of reason and process for debridement of necrotic tissue Date Initiated: 08/31/2021 Target Resolution Date: 08/31/2021 Goal Status: Active Interventions: Assess patient pain level pre-, during and post procedure and prior to discharge Provide education on necrotic tissue and debridement process Treatment Activities: Excisional debridement : 08/31/2021 Notes: Soft Tissue Infection Nursing Diagnoses: Impaired tissue integrity Knowledge deficit related to disease process and management Knowledge deficit related to home infection control: handwashing, handling of soiled dressings, supply storage Potential for infection: soft tissue Goals: Patient will remain free of wound infection Date Initiated: 08/31/2021 Target Resolution Date: 08/31/2021 Goal Status: Active Patient/caregiver will verbalize understanding of or measures to prevent infection and contamination in the home setting Date Initiated: 08/31/2021 Target Resolution Date: 08/31/2021 Goal Status: Active Patient's soft tissue infection will resolve Date Initiated: 08/31/2021 Target Resolution Date: 08/31/2021 Goal Status: Active Signs and symptoms of infection will be recognized early to allow for prompt treatment Date Initiated: 08/31/2021 Target Resolution Date: 08/31/2021 Goal Status: Active Interventions: Assess signs and symptoms of infection every visit Provide education on infection Treatment Activities: Culture and sensitivity : 08/31/2021 Education  provided on Infection : 08/31/2021 Systemic antibiotics : 08/31/2021 Jonathan Mcmillan ([id number]) Notes: Wound/Skin Impairment Nursing Diagnoses: Knowledge deficit related to ulceration/compromised skin integrity Goals: Patient/caregiver will verbalize understanding of skin care regimen Date Initiated: 07/14/2021 Target Resolution Date: 08/14/2021 Goal Status: Active Ulcer/skin breakdown will have a volume reduction of 30% by week 4 Date Initiated: 07/14/2021 Target Resolution Date: 09/13/2021 Goal Status: Active Ulcer/skin breakdown will have a volume reduction of 50% by week 8 Date Initiated: 07/14/2021 Target Resolution Date: 10/14/2021 Goal Status: Active Ulcer/skin breakdown will have a volume reduction of 80% by week 12 Date Initiated: 07/14/2021 Target Resolution Date: 11/13/2021 Goal Status: Active Ulcer/skin breakdown will heal within 14 weeks Date Initiated: 07/14/2021 Target Resolution Date: 01/03/2022 Goal Status: Active Interventions: Assess patient/caregiver ability to obtain necessary supplies Assess patient/caregiver ability to perform ulcer/skin care regimen upon admission and as needed Assess ulceration(s) every visit Notes: Electronic Signature(s) Signed: 10/20/2021 9:45:44 AM By: Carlene Coria RN Entered By: Carlene Coria on 10/19/2021 14:27:11 Jonathan Mcmillan ([id number]) --------------------------------------------------------------------------------  Pain Assessment Details Patient Name: Jonathan Mcmillan, Jonathan Mcmillan. Date of Service: 10/19/2021 2:00 PM Medical Record Number: [id number] Patient Account Number: [id number] Date of Birth/Sex: 1995/09/28 (25 y.o. M) Treating RN: Carlene Coria Primary Care Harnoor Kohles: Denton Lank Other Clinician: Referring Deniya Craigo: Denton Lank Treating Sanyia Dini/Extender: Skipper Cliche in Treatment: 13 Active Problems Location of Pain Severity and Description of Pain Patient Has Paino No Site Locations Pain Management and Medication Current Pain  Management: Electronic Signature(s) Signed: 10/20/2021 9:45:44 AM By: Carlene Coria RN Entered By: Carlene Coria on 10/19/2021 14:16:42 Jonathan Mcmillan ([id number]) -------------------------------------------------------------------------------- Patient/Caregiver Education Details Patient Name: Jonathan Mcmillan. Date of Service: 10/19/2021 2:00 PM Medical Record Number: [id number] Patient Account Number: [id number] Date of Birth/Gender: 1995-08-06 (25 y.o. M) Treating RN: Carlene Coria Primary Care Physician: Denton Lank Other Clinician: Referring Physician: Denton Lank Treating Physician/Extender: Skipper Cliche in Treatment: 13 Education Assessment Education Provided To: Patient Education Topics Provided Wound Debridement: Methods: Explain/Verbal Responses: State content correctly Electronic Signature(s) Signed: 10/20/2021 9:45:44 AM By: Carlene Coria RN Entered By: Carlene Coria on 10/19/2021 14:35:33 Jonathan Mcmillan ([id number]) -------------------------------------------------------------------------------- Wound Assessment Details Patient Name: Jonathan Mcmillan. Date of Service: 10/19/2021 2:00 PM Medical Record Number: [id number] Patient Account Number: [id number] Date of Birth/Sex: 30-May-1995 (25 y.o. M) Treating RN: Carlene Coria Primary Care Mashonda Broski: Denton Lank Other Clinician: Referring Oriel Rumbold: Denton Lank Treating Maclovia Uher/Extender: Jeri Cos Weeks in Treatment: 13 Wound Status Wound Number: 1 Primary Pressure Ulcer Etiology: Wound Location: Sacrum Wound Status: Open Wounding Event: Gradually Appeared Comorbid Hypertension, Type I Diabetes, End Stage Renal Date Acquired: 05/31/2021 History: Disease, Neuropathy Weeks Of Treatment: 13 Clustered Wound: No Photos Wound Measurements Length: (cm) 1.5 % Reduct Width: (cm) 2 % Reduct Depth: (cm) 0.7 Epitheli Area: (cm) 2.356 Tunneli Volume: (cm) 1.649 Undermi ion in Area: 92.1% ion in Volume:  97.8% alization: None ng: No ning: No Wound Description Classification: Category/Stage IV Foul Od Exudate Amount: Medium Due to Exudate Type: Serosanguineous Slough/ Exudate Color: red, brown or After Cleansing: Yes Product Use: No Fibrino Yes Wound Bed Granulation Amount: Medium (34-66%) Exposed Structure Granulation Quality: Pink Fascia Exposed: No Necrotic Amount: Medium (34-66%) Fat Layer (Subcutaneous Tissue) Exposed: Yes Tendon Exposed: No Muscle Exposed: Yes Necrosis of Muscle: No Joint Exposed: No Bone Exposed: Yes Treatment Notes Wound #1 (Sacrum) Cleanser Peri-Wound Care Topical Jonathan Mcmillan, Jonathan Mcmillan ([id number]) Primary Dressing Gauze Discharge Instruction: moistened with Dakins Solution 1/4 strngth Secondary Dressing ABD Pad 5x9 (in/in) Discharge Instruction: Cover with ABD pad Secured With Medipore Tape - 37M Medipore H Soft Cloth Surgical Tape, 2x2 (in/yd) Compression Wrap Compression Stockings Add-Ons Electronic Signature(s) Signed: 10/20/2021 9:45:44 AM By: Carlene Coria RN Entered By: Carlene Coria on 10/19/2021 14:25:39 Jonathan Mcmillan ([id number]) -------------------------------------------------------------------------------- Wound Assessment Details Patient Name: Jonathan Mcmillan. Date of Service: 10/19/2021 2:00 PM Medical Record Number: [id number] Patient Account Number: [id number] Date of Birth/Sex: 1996/02/23 (25 y.o. M) Treating RN: Carlene Coria Primary Care Gabriel Paulding: Denton Lank Other Clinician: Referring Recia Sons: Denton Lank Treating Tan Clopper/Extender: Jeri Cos Weeks in Treatment: 13 Wound Status Wound Number: 2 Primary Pressure Ulcer Etiology: Wound Location: Left, Lateral Foot Wound Status: Open Wounding Event: Gradually Appeared Comorbid Hypertension, Type I Diabetes, End Stage Renal Date Acquired: 05/31/2021 History: Disease, Neuropathy Weeks Of Treatment: 13 Clustered Wound: No Photos Wound Measurements Length: (cm) 1 %  Reduc Width: (cm) 4 % Reduc Depth: (cm) 0.5 Epithel Area: (cm) 3.142 Tunnel Volume: (cm) 1.571 Underm tion in Area: 60% tion in Volume: 0% ialization: None ing: No ining:  No Wound Description Classification: Category/Stage IV Foul O Exudate Amount: Medium Due to Exudate Type: Serosanguineous Slough Exudate Color: red, brown dor After Cleansing: Yes Product Use: No /Fibrino Yes Wound Bed Granulation Amount: Large (67-100%) Exposed Structure Granulation Quality: Red, Hyper-granulation Fascia Exposed: No Necrotic Amount: Small (1-33%) Fat Layer (Subcutaneous Tissue) Exposed: No Necrotic Quality: Adherent Slough Tendon Exposed: No Muscle Exposed: Yes Necrosis of Muscle: No Joint Exposed: No Bone Exposed: Yes Treatment Notes Wound #2 (Foot) Wound Laterality: Left, Lateral Cleanser Peri-Wound Care Topical Jonathan Mcmillan, Jonathan Mcmillan ([id number]) Primary Dressing Gauze Discharge Instruction: moistened with Dakins Solution 1/4 strngth Secondary Dressing ABD Pad 5x9 (in/in) Discharge Instruction: Cover with ABD pad Secured With Medipore Tape - 13M Medipore H Soft Cloth Surgical Tape, 2x2 (in/yd) Compression Wrap Compression Stockings Add-Ons Electronic Signature(s) Signed: 10/20/2021 9:45:44 AM By: Carlene Coria RN Entered By: Carlene Coria on 10/19/2021 14:26:27 Jonathan Mcmillan ([id number]) -------------------------------------------------------------------------------- Vitals Details Patient Name: Jonathan Mcmillan. Date of Service: 10/19/2021 2:00 PM Medical Record Number: [id number] Patient Account Number: [id number] Date of Birth/Sex: November 30, 1995 (25 y.o. M) Treating RN: Carlene Coria Primary Care Sarabelle Genson: Denton Lank Other Clinician: Referring Kemoni Ortega: Denton Lank Treating Emberlyn Burlison/Extender: Skipper Cliche in Treatment: 13 Vital Signs Time Taken: 14:16 Temperature (F): 98.1 Height (in): 68 Pulse (bpm): 94 Weight (lbs): 146 Respiratory Rate (breaths/min): 18 Body  Mass Index (BMI): 22.2 Blood Pressure (mmHg): 161/103 Reference Range: 80 - 120 mg / dl Electronic Signature(s) Signed: 10/20/2021 9:45:44 AM By: Carlene Coria RN Entered By: Carlene Coria on 10/19/2021 14:16:36

## 2021-10-25 ENCOUNTER — Encounter: Payer: Self-pay | Admitting: Cardiology

## 2021-10-25 ENCOUNTER — Ambulatory Visit (INDEPENDENT_AMBULATORY_CARE_PROVIDER_SITE_OTHER): Payer: Medicare Other | Admitting: Cardiology

## 2021-10-25 VITALS — BP 152/104 | HR 96 | Ht 69.0 in | Wt 134.6 lb

## 2021-10-25 DIAGNOSIS — Z91148 Patient's other noncompliance with medication regimen for other reason: Secondary | ICD-10-CM

## 2021-10-25 DIAGNOSIS — I5042 Chronic combined systolic (congestive) and diastolic (congestive) heart failure: Secondary | ICD-10-CM

## 2021-10-25 DIAGNOSIS — N186 End stage renal disease: Secondary | ICD-10-CM | POA: Diagnosis not present

## 2021-10-25 DIAGNOSIS — I480 Paroxysmal atrial fibrillation: Secondary | ICD-10-CM | POA: Diagnosis not present

## 2021-10-25 DIAGNOSIS — F1910 Other psychoactive substance abuse, uncomplicated: Secondary | ICD-10-CM

## 2021-10-25 DIAGNOSIS — Z79899 Other long term (current) drug therapy: Secondary | ICD-10-CM | POA: Diagnosis not present

## 2021-10-25 NOTE — Progress Notes (Signed)
Electrophysiology Office Note:    Date:  10/25/2021   ID:  Jonathan Mcmillan, DOB [birth year], MRN [id number]  PCP:  Denton Lank, MD  [facility] Cardiologist:  None  [facility] Electrophysiologist:  Vickie Epley, MD   Referring MD: Kate Sable, MD   Chief Complaint: Atrial fibrillation  History of Present Illness:    Jonathan Mcmillan is a 26 y.o. male who presents for an evaluation of atrial fibrillation at the request of Dr. Garen Lah. Their medical history includes end-stage renal disease on hemodialysis, tobacco abuse, atrial fibrillation, diabetes, PEA cardiac arrest secondary to metabolic derangements.  He last saw Dr. Garen Lah September 05, 2021 in follow-up from the hospital.  He was hospitalized in January 2023 after cardiac arrest (PEA).  During the hospitalization he developed atrial fibrillation for which amiodarone was started.  Given significant anemia, anticoagulation was never started.  He presents today for follow-up for recommendations regarding his atrial fibrillation management.  Today he tells me that he does not take his medications reliably.  He tells me that he takes his medications at most 3-4 times per week.  He does not take twice daily medications.  He will choose whether or not to take a medicine in the morning or evening.  He has not noticed any atrial fibrillation.  No syncope.  He is tolerating dialysis sessions.     Past Medical History:  Diagnosis Date   Anemia of chronic renal failure    Anoxic encephalopathy (HCC)    Anxiety    Aortic atherosclerosis ([place])    Cannabinoid hyperemesis syndrome    Cardiac arrest ([place]) 06/01/2021   a.) PEA arrest in the setting of metabolic derangements; required intubation and CRRT.   Cardiomyopathy ([place]) 06/01/2021   a.) TTE 06/01/2021: EF 35-40%.   CHF (congestive heart failure) ([place])    a.) TTE 06/01/2021: EF 35-40%; global HK, mild LA enlargement; GLS -11.3%; ; mild MR/AR; G2DD. b.) TTE  06/05/2021: EF 35-40%; PASP 31.6 mmHg; mild MR, mild-mod TR.   COVID-19 05/22/2020   Depression    Diabetes 1.5, managed as type 1 ([place])    DKA (diabetic ketoacidoses) 05/21/2016   ESRD (end stage renal disease) on dialysis ([place])    Gastroparesis    GERD (gastroesophageal reflux disease)    History of 2019 novel coronavirus disease (COVID-19) 05/22/2021   HTN (hypertension)    Marijuana use    Nicotine dependence    PAF (paroxysmal atrial fibrillation) (HCC)    a.) CHA2DS2-VASc = 4 (CHF, HTN, aortic plaque, T1DM). b.) rate/rhythm maintained on oral carvedilol + amiodarone; no chronic anticoagulation due to anemia and thrombocytopenia   Perirectal abscess 06/16/2017   Right arm cellulitis 01/26/2018   Thrombocytopenia (HCC)     Past Surgical History:  Procedure Laterality Date   AMPUTATION TOE Right 08/25/2020   partial 5th toe   AV FISTULA PLACEMENT Left 09/26/2021   Procedure: INSERTION OF ARTERIOVENOUS (AV) GORE-TEX GRAFT ARM (BRACHIAL AXILLARY );  Surgeon: Algernon Huxley, MD;  Location: [facility] ORS;  Service: Vascular;  Laterality: Left;   COLONOSCOPY WITH PROPOFOL N/A 06/21/2021   Procedure: COLONOSCOPY WITH PROPOFOL;  Surgeon: Lin Landsman, MD;  Location: [facility] ENDOSCOPY;  Service: Gastroenterology;  Laterality: N/A;   DEBRIDEMENT  FOOT Right 11/01/2019   wound   DIALYSIS/PERMA CATHETER INSERTION N/A 06/15/2021   Procedure: DIALYSIS/PERMA CATHETER INSERTION;  Surgeon: Algernon Huxley, MD;  Location: [place] [facility];  Service: Cardiovascular;  Laterality: N/A;   DIALYSIS/PERMA CATHETER INSERTION N/A  06/19/2021   Procedure: DIALYSIS/PERMA CATHETER INSERTION;  Surgeon: Algernon Huxley, MD;  Location: [place] [facility];  Service: Cardiovascular;  Laterality: N/A;   INCISION AND DRAINAGE PERIRECTAL ABSCESS N/A 06/16/2017   Procedure: IRRIGATION AND DEBRIDEMENT PERIRECTAL ABSCESS;  Surgeon: Florene Glen, MD;  Location: [facility] ORS;  Service: General;  Laterality: N/A;   none       Current Medications: Current Meds  Medication Sig   Accu-Chek Softclix Lancets lancets Use aas directed up to 4 times per day.   acetaminophen (TYLENOL) 325 MG tablet Take 1-2 tablets (325-650 mg total) by mouth every 4 (four) hours as needed for mild pain.   ascorbic acid (VITAMIN C) 500 MG tablet Take 1 tablet (500 mg total) by mouth 2 (two) times daily.   Blood Glucose Monitoring Suppl (BLOOD GLUCOSE MONITOR SYSTEM) w/Device KIT Use as directed up to 4 times per day.   calcitRIOL (ROCALTROL) 0.5 MCG capsule Take 1 capsule (0.5 mcg total) by mouth daily.   calcium carbonate (TUMS - DOSED IN MG ELEMENTAL CALCIUM) 500 MG chewable tablet Chew 1 tablet (200 mg of elemental calcium total) by mouth 3 (three) times daily.   carvedilol (COREG) 3.125 MG tablet Take 1 tablet (3.125 mg total) by mouth 2 (two) times daily with a meal.   cefdinir (OMNICEF) 300 MG capsule Take by mouth.   cinacalcet (SENSIPAR) 30 MG tablet Take 1 tablet (30 mg total) by mouth daily with breakfast.   collagenase (SANTYL) ointment Apply topically daily.   Darbepoetin Alfa (ARANESP) 60 MCG/0.3ML SOSY injection Inject 0.3 mLs (60 mcg total) into the vein every Friday with hemodialysis.   ferrous sulfate 325 (65 FE) MG tablet Take 1 tablet (325 mg total) by mouth daily with breakfast.   glucose blood test strip Use as directed   hydrocerin (EUCERIN) CREA Apply 1 application topically 2 (two) times daily.   insulin aspart (NOVOLOG) 100 UNIT/ML FlexPen Inject 0-6 Units into the skin 3 (three) times daily with meals.   insulin aspart (NOVOLOG) 100 UNIT/ML injection Inject 0-15 Units into the skin 3 (three) times daily with meals. For blood glucose to 70-150 give 0 units; 151 to 200 give 1 unit; 201 to 250 give 2 units, 251-300 give 3 units. 301 or greater, call primary care physician..   Insulin Pen Needle 32G X 4 MM MISC Use as directed   lipase/protease/amylase (CREON) 36000 UNITS CPEP capsule Take 1 capsule (36,000 Units  total) by mouth 3 (three) times daily with meals.   loperamide (IMODIUM) 2 MG capsule Take 1 capsule (2 mg total) by mouth 2 (two) times daily.   methocarbamol (ROBAXIN) 500 MG tablet Take 1 tablet (500 mg total) by mouth every 6 (six) hours as needed for muscle spasms.   multivitamin (RENA-VIT) TABS tablet Take 1 tablet by mouth at bedtime.   Nutritional Supplements (,FEEDING SUPPLEMENT, PROSOURCE PLUS) liquid Take 30 mLs by mouth 3 (three) times daily between meals.   ondansetron (ZOFRAN) 4 MG tablet Take 1 tablet (4 mg total) by mouth every 8 (eight) hours as needed for up to 10 doses for nausea or vomiting.   sacubitril-valsartan (ENTRESTO) 49-51 MG Take 1 tablet by mouth 2 (two) times daily.   thiamine 100 MG tablet Take 1 tablet (100 mg total) by mouth daily.   [DISCONTINUED] amiodarone (PACERONE) 200 MG tablet Take 1 tablet (200 mg total) by mouth daily.     Allergies:   Patient has no known allergies.   Social History  Socioeconomic History   Marital status: Single    Spouse name: Not on file   Number of children: 0   Years of education: Not on file   Highest education level: Not on file  Occupational History   Occupation: unemployed  Tobacco Use   Smoking status: Every Day    Packs/day: 0.50    Types: Cigarettes   Smokeless tobacco: Never  Vaping Use   Vaping Use: Never used  Substance and Sexual Activity   Alcohol use: No   Drug use: Yes    Types: Marijuana    Comment: + cocaine UDS 08/25/2020; negatives after   Sexual activity: Not on file  Other Topics Concern   Not on file  Social History Narrative   Lives with aunt   Social Determinants of Health   Financial Resource Strain: Not on file  Food Insecurity: Not on file  Transportation Needs: Not on file  Physical Activity: Not on file  Stress: Not on file  Social Connections: Not on file     Family History: The patient's family history includes Diabetes in his mother.  ROS:   Please see the history of  present illness.    All other systems reviewed and are negative.  EKGs/Labs/Other Studies Reviewed:    The following studies were reviewed today:   EKG:  The ekg ordered today demonstrates sinus rhythm with a ventricular rate of 96 bpm.  QTc is 512 milliseconds.  No preexcitation.   Recent Labs: 06/04/2021: B Natriuretic Peptide 3,692.3 06/16/2021: Magnesium 2.3 07/05/2021: ALT 10 09/26/2021: BUN 34; Creatinine, Ser 4.77; Hemoglobin 7.9; Platelets 259; Potassium 3.7; Sodium 140  Recent Lipid Panel    Component Value Date/Time   TRIG 110 06/08/2021 0407    Physical Exam:    VS:  BP (!) 152/104   Pulse 96   Ht 5' 9"  (1.753 m)   Wt 134 lb 9.6 oz (61.1 kg)   SpO2 99%   BMI 19.88 kg/m     Wt Readings from Last 3 Encounters:  10/25/21 134 lb 9.6 oz (61.1 kg)  09/25/21 131 lb (59.4 kg)  09/15/21 131 lb (59.4 kg)     GEN:  Chronically ill-appearing.  Thin.  In no distress. HEENT: Normal NECK: No JVD; No carotid bruits LYMPHATICS: No lymphadenopathy CARDIAC: RRR, no murmurs, rubs, gallops RESPIRATORY:  Clear to auscultation without rales, wheezing or rhonchi  ABDOMEN: Soft, non-tender, non-distended MUSCULOSKELETAL:  No edema; No deformity  SKIN: Warm and dry NEUROLOGIC:  Alert and oriented x 3 PSYCHIATRIC:  Normal affect       ASSESSMENT:    1. Paroxysmal atrial fibrillation (HCC)   2. Encounter for long-term (current) use of high-risk medication   3. History of medication noncompliance   4. ESRD (end stage renal disease) ([place])   5. Polysubstance abuse ([place])   6. Chronic combined systolic and diastolic CHF (congestive heart failure) (HCC)    PLAN:    In order of problems listed above:  #Atrial fibrillation Was started on amiodarone while inpatient after his PEA arrest.  He does not take his medications reliably.  I confirmed multiple times during today's visit that he is not taking his amiodarone reliably.  I have asked him to stop the amiodarone altogether.  He  will continue taking his beta-blocker.  He is in normal rhythm today.  He will continue to follow-up with his primary cardiologist, Dr. Garen Lah.  Not a candidate for left atrial appendage occlusion given low CHA2DS2-VASc score and medication regimen  noncompliance.  #ESRD IHD 3 times per week.  Tolerating HD sessions.  #Chronic combined systolic congestive heart failure NYHA class II-III.  Warm and dry on exam today.  ESRD with IH D for volume management.  Continue Coreg, Entresto.  Currently not a candidate for defibrillator given medication regimen noncompliance and EF greater than 35%.  Follow-up with EP on an as-needed basis.   Medication Adjustments/Labs and Tests Ordered: Current medicines are reviewed at length with the patient today.  Concerns regarding medicines are outlined above.  No orders of the defined types were placed in this encounter.  No orders of the defined types were placed in this encounter.    Signed, Hilton Cork. Quentin Ore, MD, [facility] 10/25/2021 9:15 PM    Electrophysiology [facility]

## 2021-10-25 NOTE — Patient Instructions (Addendum)
Medications: Stop Amiodarone Your physician recommends that you continue on your current medications as directed. Please refer to the Current Medication list given to you today. *If you need a refill on your cardiac medications before your next appointment, please call your pharmacy*  Lab Work: None. If you have labs (blood work) drawn today and your tests are completely normal, you will receive your results only by: Post (if you have MyChart) OR A paper copy in the mail If you have any lab test that is abnormal or we need to change your treatment, we will call you to review the results.  Testing/Procedures: None.  Follow-Up: At [facility], you and your health needs are our priority.  As part of our continuing mission to provide you with exceptional heart care, we have created designated Provider Care Teams.  These Care Teams include your primary Cardiologist (physician) and Advanced Practice Providers (APPs -  Physician Assistants and Nurse Practitioners) who all work together to provide you with the care you need, when you need it.  Your physician wants you to follow-up in: As needed with Lars Mage, MD   We recommend signing up for the patient portal called "MyChart".  Sign up information is provided on this After Visit Summary.  MyChart is used to connect with patients for Virtual Visits (Telemedicine).  Patients are able to view lab/test results, encounter notes, upcoming appointments, etc.  Non-urgent messages can be sent to your provider as well.   To learn more about what you can do with MyChart, go to [email or web address].    Any Other Special Instructions Will Be Listed Below (If Applicable).

## 2021-10-27 ENCOUNTER — Ambulatory Visit: Payer: Self-pay | Admitting: Cardiology

## 2021-10-27 DIAGNOSIS — L89150 Pressure ulcer of sacral region, unstageable: Secondary | ICD-10-CM | POA: Diagnosis present

## 2021-10-27 DIAGNOSIS — G931 Anoxic brain damage, not elsewhere classified: Secondary | ICD-10-CM | POA: Diagnosis not present

## 2021-10-27 DIAGNOSIS — L89890 Pressure ulcer of other site, unstageable: Secondary | ICD-10-CM | POA: Diagnosis not present

## 2021-10-27 DIAGNOSIS — E1040 Type 1 diabetes mellitus with diabetic neuropathy, unspecified: Secondary | ICD-10-CM | POA: Diagnosis not present

## 2021-10-27 DIAGNOSIS — E1022 Type 1 diabetes mellitus with diabetic chronic kidney disease: Secondary | ICD-10-CM | POA: Diagnosis not present

## 2021-10-27 DIAGNOSIS — Z992 Dependence on renal dialysis: Secondary | ICD-10-CM | POA: Diagnosis not present

## 2021-10-27 DIAGNOSIS — N186 End stage renal disease: Secondary | ICD-10-CM | POA: Diagnosis not present

## 2021-10-27 DIAGNOSIS — I2510 Atherosclerotic heart disease of native coronary artery without angina pectoris: Secondary | ICD-10-CM | POA: Diagnosis not present

## 2021-10-27 DIAGNOSIS — I120 Hypertensive chronic kidney disease with stage 5 chronic kidney disease or end stage renal disease: Secondary | ICD-10-CM | POA: Diagnosis not present

## 2021-10-27 DIAGNOSIS — F17200 Nicotine dependence, unspecified, uncomplicated: Secondary | ICD-10-CM | POA: Diagnosis not present

## 2021-10-30 ENCOUNTER — Encounter: Payer: Self-pay | Admitting: Cardiology

## 2021-11-07 ENCOUNTER — Encounter: Payer: Medicare Other | Admitting: Physician Assistant

## 2021-11-07 DIAGNOSIS — L89150 Pressure ulcer of sacral region, unstageable: Secondary | ICD-10-CM | POA: Diagnosis not present

## 2021-11-07 NOTE — Progress Notes (Addendum)
KAROL, LIENDO ([id number]) Visit Report for 11/07/2021 Chief Complaint Document Details Patient Name: Jonathan Mcmillan, Jonathan Mcmillan. Date of Service: 11/07/2021 2:30 PM Medical Record Number: [id number] Patient Account Number: [id number] Date of Birth/Sex: [birth year] (25 y.o. M) Treating RN: Levora Dredge Primary Care Provider: Denton Lank Other Clinician: Referring Provider: Denton Lank Treating Provider/Extender: Skipper Cliche in Treatment: 16 Information Obtained from: Patient Chief Complaint Sacral, left lateral foot, and right knee pressure ulcers Electronic Signature(s) Signed: 11/07/2021 2:43:02 PM By: Worthy Keeler PA-C Entered By: Worthy Keeler on 11/07/2021 14:43:01 Jonathan Mcmillan ([id number]) -------------------------------------------------------------------------------- Debridement Details Patient Name: Jonathan Mcmillan. Date of Service: 11/07/2021 2:30 PM Medical Record Number: [id number] Patient Account Number: [id number] Date of Birth/Sex: 1995/07/27 (25 y.o. M) Treating RN: Levora Dredge Primary Care Provider: Denton Lank Other Clinician: Referring Provider: Denton Lank Treating Provider/Extender: Skipper Cliche in Treatment: 16 Debridement Performed for Wound #2 Left,Lateral Foot Assessment: Performed By: Physician Tommie Sams., PA-C Debridement Type: Debridement Level of Consciousness (Pre- Awake and Alert procedure): Pre-procedure Verification/Time Out Yes - 14:51 Taken: Pain Control: Lidocaine 4% Topical Solution Total Area Debrided (L x W): 1 (cm) x 3 (cm) = 3 (cm) Tissue and other material Viable, Non-Viable, Callus, Slough, Subcutaneous, Slough debrided: Level: Skin/Subcutaneous Tissue Debridement Description: Excisional Instrument: Curette Bleeding: Minimum Hemostasis Achieved: Pressure Response to Treatment: Procedure was tolerated well Level of Consciousness (Post- Awake and Alert procedure): Post Debridement Measurements of  Total Wound Length: (cm) 1 Stage: Category/Stage IV Width: (cm) 3 Depth: (cm) 0.5 Volume: (cm) 1.178 Character of Wound/Ulcer Post Debridement: Stable Post Procedure Diagnosis Same as Pre-procedure Electronic Signature(s) Signed: 11/07/2021 3:29:54 PM By: Levora Dredge Signed: 11/10/2021 4:59:17 PM By: Worthy Keeler PA-C Entered By: Levora Dredge on 11/07/2021 14:52:01 Jonathan Mcmillan ([id number]) -------------------------------------------------------------------------------- HPI Details Patient Name: Jonathan Mcmillan. Date of Service: 11/07/2021 2:30 PM Medical Record Number: [id number] Patient Account Number: [id number] Date of Birth/Sex: 10/07/1995 (25 y.o. M) Treating RN: Levora Dredge Primary Care Provider: Denton Lank Other Clinician: Referring Provider: Denton Lank Treating Provider/Extender: Skipper Cliche in Treatment: 16 History of Present Illness HPI Description: 07/14/2021 patient presents today for initial inspection here in the clinic concerning an issue that he has been having with wounds over the left lateral foot, right knee, and the largest of these is the sacral region where there is significant necrotic tissue noted in the base of this wound. Subsequently the patient is referred to [place] from what appears to be primary care in order for further management of his wounds. He also sees physical medicine and rehabilitation. I did receive a lot of the notes from there as far as what is been going on with regard to this gentleman. Patient is a 26 year old male who had an anoxic encephalopathy, end-stage renal disease on hemodialysis, he has been disabled with generalized weakness and subsequently developed a sacral decubitus ulcer which apparently was present before he went into the hospital according to what is not tells me but worsened during the time in the ICU simply because he was unable to be turned with all the equipment that he was hooked up to. Again it  sounds like they did what they could to try to keep this under control but nonetheless due to the severity of his illness it sounds as if he is lucky to be alive to be perfectly honest. Subsequently the patient also does have diabetes mellitus, hypertension, nicotine dependence, he did go to the emergency department on 07/05/2021 due to  a "abscess" in the gluteal region. The family member had seen this and wanted him to be checked out. During that time medical staff wanted to admit the patient but he refused. On 07/06/2021 the patient was subsequently discharged from the hospital [place] and apparently they were trying to get in touch according to the notes with his aunt who is the legal guardian but were not able to. That was around 12:37 AM. Nonetheless in the interim since they have been basically at home using Santyl which was the recommendation from the hospital. This has been on all locations. Dry dressings have been applied following. Unfortunately this just does not seem to be doing quite enough especially for the sacral area which is a significant wound to be honest. We do not have any sign of an x-ray that was done of the sacral area nor that they know of anything in particular that was done at the hospital. With all that being said I think an x-ray would be a good idea based on what I am seeing currently this does appear to be quite significant from a wound care perspective. 3/14; patient presents for follow-up. He has no issues or complaints today. He has been using Dakin's wet-to-dry dressings to the wound beds. He denies signs of infection. He reports Currently taking Augmentin. 08/08/2021 upon evaluation today patient actually appears to be doing better in regard to his wounds in the sacral region as well as the knee both of which are showing signs of excellent improvement. In regard to his foot I am not certain exactly what his blood flow is at this point. That something but  I am going to spend a little bit of time looking into the chart regarding. Fortunately I do not see any signs of infection but I do believe he is probably can need some sharp debridement here but I do not want to perform this until we can confirm good blood flow. I know we were able to do this first visit because he was in the bed in the room where we can get him in position. We also were not able to do at last visit as we did not end up actually seeing him due to some incontinence issues. Nonetheless I think that we need to plan for ABIs at the next visit. 08-17-2021 upon evaluation today patient appears to be doing well with regard to his sacral wound as well as the knee the knee is completely healed. In regard to the foot he does have need for sharp debridement today. I am going to do that he is noncompressible but nonetheless has good pulses and good capillary refill there is no evidence of compromise here although he does have a lot of necrotic tissue we need to help clean away here. 08-24-2021 upon evaluation today patient's sacral region is actually doing quite well. Unfortunately the left lateral foot area is not doing nearly as well there is a lot of necrotic tissue here including necrotic muscle which I would have to attempt to remove today. Overall I do feel like that the patient is showing some signs of improvement were still holding as far as his insurance is concerned and the MRI being scheduled apparently we do not have documentation of insurance that will cover for the MRI and therefore they are not scheduling it at this point. Nonetheless his aunt does need to figure this out as soon as possible as we really do need to look into getting this  MRI done ASAP with regard to the left foot. 08-31-2021 upon evaluation today patient appears to be doing better in regard to his foot ulcer though he still has a significant amount of necrotic tissue. Apparently during the conversation today it became  apparent that he has not even taken the Augmentin I previously prescribed for him. Again his x-ray did show evidence of potential for osteomyelitis but we had ordered an MRI but at the same time he was unable to get this as apparently he does not have insurance that covers anything that we are actually investigating and doing at this point. It was going to be close to $700 and it appears that he and the family have decided not to do this. Nonetheless he is going to need antibiotic treatment and really for at least 6 weeks based on what we are seeing. It looks like Omnicef should be appropriate based on the culture results from last week he is also going require some debridement today 4/27; large wound on the left lateral foot. Undermining but about the same in terms of measurements as last week. According to our intake nurse condition of the wound bed looks better. Similarly the area on the sacrum seems to be filling and inferiorly although there is still under mining at 12:00. Using Dakin's wet-to-dry 5/10; patient presents for follow-up. He has no issues or complaints today. He states he has some days left to finish his antibiotic course. He has been using Dakin's wet-to-dry dressings. He denies signs of infection. 10-05-2021 upon evaluation today patient appears to be doing well with regard to his wounds. He has been tolerating the dressing changes without complication. Fortunately I do not see any evidence of infection locally or systemically which is great news. No fevers, chills, nausea, vomiting, or diarrhea. 10-19-2021 upon evaluation today patient appears to be doing well with regard to his wounds. Both are showing signs of improvement the one in the gluteal area is just a small slit almost completely closed. 1 in the lateral portion of his foot is much better and overall I am very pleased I think there is just a very tiny area this can require some sharp debridement the remainder looks to be  doing awesome. 11-07-2021 upon evaluation today patient's wound actually on the gluteal/sacral region actually appears to be completely healed which is great Jonathan Mcmillan, Jonathan Mcmillan. ([id number]) news. I do not see any evidence of active infection locally or systemically which is great news. With that being said the wound on the foot area did not have a dressing on and the patient tells me it has not had 1 since Saturday. He tells me his aunt broke her ankle and therefore has not been able to help him for that reason I Jonathan Mcmillan. I think Hydrofera Blue may be a good option in that regard. The patient voiced understanding and agreement with giving that a try. Electronic Signature(s) Signed: 11/08/2021 9:29:35 AM By: Worthy Keeler PA-C Entered By: Worthy Keeler on 11/08/2021 09:29:35 Jonathan Mcmillan ([id number]) -------------------------------------------------------------------------------- Physical Exam Details Patient Name: Jonathan Mcmillan, Jonathan Mcmillan. Date of Service: 11/07/2021 2:30 PM Medical Record Number: [id number] Patient Account Number: [id number] Date of Birth/Sex: [birth year] (25 y.o. M) Treating RN: Levora Dredge Primary Care Provider: Denton Lank Other Clinician: Referring Provider: Denton Lank Treating Provider/Extender: Jeri Cos Weeks in Treatment: 32 Constitutional Well-nourished and well-hydrated in no  acute distress. Respiratory normal breathing without difficulty. Psychiatric this patient is able to make decisions and demonstrates good insight into disease process. Alert and Oriented x 3. pleasant and cooperative. Notes Patient's wound bed did require sharp debridement of clearway some of the necrotic debris he tolerated the debridement today without complication and postdebridement wound bed appears to be doing significantly better which is great news. Overall I am extremely pleased with where  we stand today. Electronic Signature(s) Signed: 11/08/2021 9:29:51 AM By: Worthy Keeler PA-C Entered By: Worthy Keeler on 11/08/2021 09:29:50 Jonathan Mcmillan ([id number]) -------------------------------------------------------------------------------- Physician Orders Details Patient Name: Jonathan Mcmillan, Jonathan Mcmillan. Date of Service: 11/07/2021 2:30 PM Medical Record Number: [id number] Patient Account Number: [id number] Date of Birth/Sex: 09/29/1995 (25 y.o. M) Treating RN: Levora Dredge Primary Care Provider: Denton Lank Other Clinician: Referring Provider: Denton Lank Treating Provider/Extender: Skipper Cliche in Treatment: [phone] Verbal / Phone Orders: No Diagnosis Coding ICD-10 Coding Code Description L89.154 Pressure ulcer of sacral region, stage 4 L89.894 Pressure ulcer of other site, stage 4 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, not elsewhere classified I25.10 Atherosclerotic heart disease of native coronary artery without angina pectoris Follow-up Appointments o Return Appointment in 2 weeks. Bathing/ Shower/ Hygiene o May shower; gently cleanse wound with antibacterial soap, rinse and pat dry prior to dressing wounds Non-Wound Condition o Additional non-wound orders/instructions: - A and D ointment recommended to use on healed sacrum area. Wound Treatment Wound #2 - Foot Wound Laterality: Left, Lateral Primary Dressing: Hydrofera Blue Ready Transfer Foam, 4x5 (in/in) 3 x Per Week/30 Days Discharge Instructions: Apply Hydrofera Blue Ready to wound bed as directed Secondary Dressing: Coverlet Latex-Free Fabric Adhesive Dressings 3 x Per Week/30 Days Discharge Instructions: 1.5 x 2 Electronic Signature(s) Signed: 11/07/2021 3:29:54 PM By: Levora Dredge Signed: 11/10/2021 4:59:17 PM By: Worthy Keeler PA-C Entered By: Levora Dredge on 11/07/2021 14:54:03 Jonathan Mcmillan ([id number]) -------------------------------------------------------------------------------- Problem List  Details Patient Name: Jonathan Mcmillan. Date of Service: 11/07/2021 2:30 PM Medical Record Number: [id number] Patient Account Number: [id number] Date of Birth/Sex: 05-03-1996 (25 y.o. 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 Date of Service: 11/07/2021 2:30 PM Medical Record Number: [id number] Patient Account Number: [id number] Date of Birth/Sex: 01-26-1996 (25 y.o. M) Treating RN: Levora Dredge Primary Care Provider: Denton Lank Other Clinician: Referring Provider: Denton Lank Treating Provider/Extender: Skipper Cliche in Treatment: 16 Subjective Chief Complaint Information obtained from Patient Sacral, left lateral foot, and right knee pressure ulcers History of Present Illness (HPI) 07/14/2021 patient presents today for initial inspection here in the clinic concerning an issue that he has been having with wounds over the left lateral foot, right knee, and the largest of these is the sacral region where there is significant necrotic tissue noted in the base of this wound. Subsequently the patient is referred to [place] from what appears to be  primary  care in order for further management of his wounds. He also sees physical medicine and rehabilitation. I did receive a lot of the notes from there as far as what is been going on with regard to this gentleman. Patient is a 26 year old male who had an anoxic encephalopathy, end-stage renal disease on hemodialysis, he has been disabled with generalized weakness and subsequently developed a sacral decubitus ulcer which apparently was present before he went into the hospital according to what is not tells me but worsened during the time in the ICU simply because he was unable to be turned with all the equipment that he was hooked up to. Again it sounds like they did what they could to try to keep this under control but nonetheless due to the severity of his illness it sounds as if he is lucky to be alive to be perfectly honest. Subsequently the patient also does have diabetes mellitus, hypertension, nicotine dependence, he did go to the emergency department on 07/05/2021 due to a "abscess" in the gluteal region. The family member had seen this and wanted him to be checked out. During that time medical staff wanted to admit the patient but he refused. On 07/06/2021 the patient was subsequently discharged from the hospital [place] and apparently they were trying to get in touch according to the notes with his aunt who is the legal guardian but were not able to. That was around 12:37 AM. Nonetheless in the interim since they have been basically at home using Santyl which was the recommendation from the hospital. This has been on all locations. Dry dressings have been applied following. Unfortunately this just does not seem to be doing quite enough especially for the sacral area which is a significant wound to be honest. We do not have any sign of an x-ray that was done of the sacral area nor that they know of anything in particular that was done at the hospital. With all that being said I  think an x-ray would be a good idea based on what I am seeing currently this does appear to be quite significant from a wound care perspective. 3/14; patient presents for follow-up. He has no issues or complaints today. He has been using Dakin's wet-to-dry dressings to the wound beds. He denies signs of infection. He reports Currently taking Augmentin. 08/08/2021 upon evaluation today patient actually appears to be doing better in regard to his wounds in the sacral region as well as the knee both of which are showing signs of excellent improvement. In regard to his foot I am not certain exactly what his blood flow is at this point. That something but I am going to spend a little bit of time looking into the chart regarding. Fortunately I do not see any signs of infection but I do believe he is probably can need some sharp debridement here but I do not want to perform this until we can confirm good blood flow. I know we were able to do this first visit because he was in the bed in the room where we can get him in position. We also were not able to do at last visit as we did not end up actually seeing him due to some incontinence issues. Nonetheless I think that we need to plan for ABIs at the next visit. 08-17-2021 upon evaluation today patient appears to be doing well with regard to his sacral wound as well as the knee the knee is completely healed. In regard  to the foot he does have need for sharp debridement today. I am going to do that he is noncompressible but nonetheless has good pulses and good capillary refill there is no evidence of compromise here although he does have a lot of necrotic tissue we need to help clean away here. 08-24-2021 upon evaluation today patient's sacral region is actually doing quite well. Unfortunately the left lateral foot area is not doing nearly as well there is a lot of necrotic tissue here including necrotic muscle which I would have to attempt to remove today. Overall  I do feel like that the patient is showing some signs of improvement were still holding as far as his insurance is concerned and the MRI being scheduled apparently we do not have documentation of insurance that will cover for the MRI and therefore they are not scheduling it at this point. Nonetheless his aunt does need to figure this out as soon as possible as we really do need to look into getting this MRI done ASAP with regard to the left foot. 08-31-2021 upon evaluation today patient appears to be doing better in regard to his foot ulcer though he still has a significant amount of necrotic tissue. Apparently during the conversation today it became apparent that he has not even taken the Augmentin I previously prescribed for him. Again his x-ray did show evidence of potential for osteomyelitis but we had ordered an MRI but at the same time he was unable to get this as apparently he does not have insurance that covers anything that we are actually investigating and doing at this point. It was going to be close to $700 and it appears that he and the family have decided not to do this. Nonetheless he is going to need antibiotic treatment and really for at least 6 weeks based on what we are seeing. It looks like Omnicef should be appropriate based on the culture results from last week he is also going require some debridement today 4/27; large wound on the left lateral foot. Undermining but about the same in terms of measurements as last week. According to our intake nurse condition of the wound bed looks better. Similarly the area on the sacrum seems to be filling and inferiorly although there is still under mining at 12:00. Using Dakin's wet-to-dry 5/10; patient presents for follow-up. He has no issues or complaints today. He states he has some days left to finish his antibiotic course. He has been using Dakin's wet-to-dry dressings. He denies signs of infection. 10-05-2021 upon evaluation today  patient appears to be doing well with regard to his wounds. He has been tolerating the dressing changes without complication. Fortunately I do not see any evidence of infection locally or systemically which is great news. No fevers, chills, nausea, vomiting, or diarrhea. 10-19-2021 upon evaluation today patient appears to be doing well with regard to his wounds. Both are showing signs of improvement the one in [place]. ([id number]) the gluteal area is just a small slit almost completely closed. 1 in the lateral portion of his foot is much better and overall I am very pleased I think there is just a very tiny area this can require some sharp debridement the remainder looks to be doing awesome. 11-07-2021 upon evaluation today patient's wound actually on the gluteal/sacral region actually appears to be completely healed which is great news. I do not see any evidence of active infection locally or systemically which is great news. With that being said  the wound on the foot area did not have a dressing on and the patient tells me it has not had 1 since Saturday. He tells me his aunt broke her ankle and therefore has not been able to help him for that reason I Jonathan Mcmillan. I think Hydrofera Blue may be a good option in that regard. The patient voiced understanding and agreement with giving that a try. Objective Constitutional Well-nourished and well-hydrated in no acute distress. Vitals Time Taken: 2:29 PM, Height: 68 in, Weight: 146 lbs, BMI: 22.2, Temperature: 98.2 F, Pulse: 89 bpm, Respiratory Rate: 18 breaths/min, Blood Pressure: 144/95 mmHg. Respiratory normal breathing without difficulty. Psychiatric this patient is able to make decisions and demonstrates good insight into disease process. Alert and Oriented x 3. pleasant and cooperative. General Notes: Patient's wound bed did require sharp debridement  of clearway some of the necrotic debris he tolerated the debridement today without complication and postdebridement wound bed appears to be doing significantly better which is great news. Overall I am extremely pleased with where we stand today. Integumentary (Hair, Skin) Wound #1 status is Healed - Epithelialized. Original cause of wound was Gradually Appeared. The date acquired was: 05/31/2021. The wound has been in treatment 16 weeks. The wound is located on the Sacrum. The wound measures 0cm length x 0cm width x 0cm depth; 0cm^2 area and 0cm^3 volume. There is no tunneling or undermining noted. There is a none present amount of drainage noted. There is no granulation within the wound bed. There is no necrotic tissue within the wound bed. Wound #2 status is Open. Original cause of wound was Gradually Appeared. The date acquired was: 05/31/2021. The wound has been in treatment 16 weeks. The wound is located on the Left,Lateral Foot. The wound measures 1cm length x 3cm width x 0.5cm depth; 2.356cm^2 area and 1.178cm^3 volume. There is bone, muscle, and Fat Layer (Subcutaneous Tissue) exposed. There is no tunneling or undermining noted. There is a medium amount of serosanguineous drainage noted. There is small (1-33%) pink, hyper - granulation within the wound bed. There is a large (67- 100%) amount of necrotic tissue within the wound bed including Eschar and Adherent Slough. 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 There was a Excisional Skin/Subcutaneous Tissue Debridement with a total area of 3 sq cm performed by Tommie Sams., PA-C. With the following instrument(s): Curette to remove  Viable and Non-Viable tissue/material. Material removed includes Callus, Subcutaneous Tissue, and Slough after achieving pain control using Lidocaine 4% Topical Solution. No specimens were taken. A time out was conducted at 14:51, prior to the start of the procedure. A Minimum amount of bleeding was controlled with Pressure. The procedure was tolerated well. Post Debridement Measurements: 1cm length x 3cm width x 0.5cm Jonathan Mcmillan, Jonathan M. ([id number]) depth; 1.178cm^3 volume. Post debridement Stage noted as Category/Stage IV. Character of Wound/Ulcer Post Debridement is stable. Post procedure Diagnosis Wound #2: Same as Pre-Procedure Plan Follow-up Appointments: Return Appointment in 2 weeks. Bathing/ Shower/ Hygiene: May shower; gently cleanse wound with antibacterial soap, rinse and pat dry prior to dressing wounds Non-Wound Condition: Additional non-wound orders/instructions: -  A and D ointment recommended to use on healed sacrum area. WOUND #2: - Foot Wound Laterality: Left, Lateral Primary Dressing: Hydrofera Blue Ready Transfer Foam, 4x5 (in/in) 3 x Per Week/30 Days Discharge Instructions: Apply Hydrofera Blue Ready to wound bed as directed Secondary Dressing: Coverlet Latex-Free Fabric Adhesive Dressings 3 x Per Week/30 Days Discharge Instructions: 1.5 x 2 1. I Minna suggest we go ahead and continue with the wound care measures as before and the patient is in agreement with plan that includes the use of the Hydrofera Blue to the foot he should have enough of the left over to Mcmillan this every couple of days or 3 times a week. He voiced understanding. 2. I am also can recommend that we have the patient continue with the AandD ointment to the sacral region which I think will help to soften up a lot of the dry crusted skin over this area. We will see patient back for reevaluation in 2 weeks here in the clinic. If anything worsens or changes patient will contact our office for  additional recommendations. Electronic Signature(s) Signed: 11/08/2021 9:30:25 AM By: Worthy Keeler PA-C Entered By: Worthy Keeler on 11/08/2021 09:30:25 Jonathan Mcmillan ([id number]) -------------------------------------------------------------------------------- SuperBill Details Patient Name: Jonathan Mcmillan. Date of Service: 11/07/2021 Medical Record Number: [id number] Patient Account Number: [id number] Date of Birth/Sex: [birth year] (25 y.o. M) Treating RN: Levora Dredge Primary Care Provider: Denton Lank Other Clinician: Referring Provider: Denton Lank Treating Provider/Extender: Jeri Cos Weeks in Treatment: 16 Diagnosis Coding ICD-10 Codes Code Description [phone] Pressure ulcer of sacral region, stage 4 L89.894 Pressure ulcer of other site, stage 4 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, not elsewhere classified I25.10 Atherosclerotic heart disease of native coronary artery without angina pectoris Facility Procedures CPT4 Code: [id number] Description: 66294 - DEB SUBQ TISSUE 20 SQ CM/< Modifier: Quantity: 1 CPT4 Code: Description: ICD-10 Diagnosis Description L89.894 Pressure ulcer of other site, stage 4 Modifier: Quantity: Physician Procedures CPT4 Code: 7654650 Description: 11042 - WC PHYS SUBQ TISS 20 SQ CM Modifier: Quantity: 1 CPT4 Code: Description: ICD-10 Diagnosis Description L89.894 Pressure ulcer of other site, stage 4 Modifier: Quantity: Electronic Signature(s) Signed: 11/08/2021 9:30:40 AM By: Worthy Keeler PA-C Entered By: Worthy Keeler on 11/08/2021 09:30:40

## 2021-11-17 ENCOUNTER — Telehealth (INDEPENDENT_AMBULATORY_CARE_PROVIDER_SITE_OTHER): Payer: Self-pay

## 2021-11-17 NOTE — Telephone Encounter (Signed)
Spoke with Will at [facility] and the patient is scheduled for a permcath removal on 11/20/21 with a 1:45 pm arrival time to the MM. Pre-procedure instructions will be faxed to Will at [facility].

## 2021-11-21 ENCOUNTER — Encounter: Payer: Self-pay | Admitting: Vascular Surgery

## 2021-11-21 ENCOUNTER — Encounter
Admission: EM | Disposition: A | Discharge status: Home / Self Care | Payer: Self-pay | Source: Home / Self Care | Attending: Vascular Surgery

## 2021-11-21 ENCOUNTER — Ambulatory Visit
Admission: EM | Admit: 2021-11-21 | Discharge: 2021-11-21 | Disposition: A | Discharge status: Home / Self Care | Payer: Medicare Other | Attending: Vascular Surgery | Admitting: Vascular Surgery

## 2021-11-21 DIAGNOSIS — Z4901 Encounter for fitting and adjustment of extracorporeal dialysis catheter: Secondary | ICD-10-CM | POA: Diagnosis present

## 2021-11-21 DIAGNOSIS — Z452 Encounter for adjustment and management of vascular access device: Secondary | ICD-10-CM

## 2021-11-21 DIAGNOSIS — Z7901 Long term (current) use of anticoagulants: Secondary | ICD-10-CM | POA: Diagnosis not present

## 2021-11-21 DIAGNOSIS — E1022 Type 1 diabetes mellitus with diabetic chronic kidney disease: Secondary | ICD-10-CM | POA: Insufficient documentation

## 2021-11-21 DIAGNOSIS — I480 Paroxysmal atrial fibrillation: Secondary | ICD-10-CM | POA: Diagnosis not present

## 2021-11-21 DIAGNOSIS — F17210 Nicotine dependence, cigarettes, uncomplicated: Secondary | ICD-10-CM | POA: Insufficient documentation

## 2021-11-21 DIAGNOSIS — Z992 Dependence on renal dialysis: Secondary | ICD-10-CM | POA: Diagnosis not present

## 2021-11-21 DIAGNOSIS — N186 End stage renal disease: Secondary | ICD-10-CM | POA: Diagnosis not present

## 2021-11-21 DIAGNOSIS — E119 Type 2 diabetes mellitus without complications: Secondary | ICD-10-CM

## 2021-11-21 DIAGNOSIS — I132 Hypertensive heart and chronic kidney disease with heart failure and with stage 5 chronic kidney disease, or end stage renal disease: Secondary | ICD-10-CM | POA: Insufficient documentation

## 2021-11-21 DIAGNOSIS — I509 Heart failure, unspecified: Secondary | ICD-10-CM | POA: Insufficient documentation

## 2021-11-21 HISTORY — PX: DIALYSIS/PERMA CATHETER REMOVAL: CATH118289

## 2021-11-21 SURGERY — DIALYSIS/PERMA CATHETER REMOVAL
Anesthesia: LOCAL

## 2021-11-21 MED ORDER — LIDOCAINE-EPINEPHRINE (PF) 1 %-1:200000 IJ SOLN
INTRAMUSCULAR | Status: DC | PRN
  Administered 2021-11-21: 20 mL via INTRADERMAL

## 2021-11-21 MED ORDER — SODIUM CHLORIDE 0.9 % IV SOLN: INTRAVENOUS | Status: DC

## 2021-11-21 MED ORDER — DIPHENHYDRAMINE HCL 50 MG/ML IJ SOLN: 50.0000 mg | Freq: Once | INTRAMUSCULAR | Status: DC | PRN

## 2021-11-21 MED ORDER — CEFAZOLIN SODIUM-DEXTROSE 2-4 GM/100ML-% IV SOLN: 2.0000 g | INTRAVENOUS | Status: DC

## 2021-11-21 MED ORDER — HYDROMORPHONE HCL 1 MG/ML IJ SOLN: 1.0000 mg | Freq: Once | INTRAMUSCULAR | Status: DC | PRN

## 2021-11-21 MED ORDER — FAMOTIDINE 20 MG PO TABS: 40.0000 mg | ORAL_TABLET | Freq: Once | ORAL | Status: DC | PRN

## 2021-11-21 MED ORDER — ONDANSETRON HCL 4 MG/2ML IJ SOLN: 4.0000 mg | Freq: Four times a day (QID) | INTRAMUSCULAR | Status: DC | PRN

## 2021-11-21 MED ORDER — METHYLPREDNISOLONE SODIUM SUCC 125 MG IJ SOLR: 125.0000 mg | Freq: Once | INTRAMUSCULAR | Status: DC | PRN

## 2021-11-21 MED ORDER — MIDAZOLAM HCL 2 MG/ML PO SYRP: 8.0000 mg | ORAL_SOLUTION | Freq: Once | ORAL | Status: DC | PRN

## 2021-11-21 SURGICAL SUPPLY — 4 items
APL PRP STRL LF DISP 70% ISPRP (MISCELLANEOUS) ×2
CHLORAPREP W/TINT 26 (MISCELLANEOUS) ×2 IMPLANT
FORCEPS HALSTEAD CVD 5IN STRL (INSTRUMENTS) ×1 IMPLANT
TRAY LACERAT/PLASTIC (MISCELLANEOUS) ×1 IMPLANT

## 2021-11-21 NOTE — Interval H&P Note (Signed)
History and Physical Interval Note:  11/21/2021 3:56 PM  Jonathan Mcmillan  has presented today for surgery, with the diagnosis of Perma Cath Removal   End Stage Renal.  The various methods of treatment have been discussed with the patient and family. After consideration of risks, benefits and other options for treatment, the patient has consented to  Procedure(s): DIALYSIS/PERMA CATHETER REMOVAL (N/A) as a surgical intervention.  The patient's history has been reviewed, patient examined, no change in status, stable for surgery.  I have reviewed the patient's chart and labs.  Questions were answered to the patient's satisfaction.     Hortencia Pilar

## 2021-11-21 NOTE — Progress Notes (Signed)
MRN : [id number]  Jonathan Mcmillan is a 26 y.o. (1995/08/31) male who presents with chief complaint of check access.  History of Present Illness:  Patient presents to [facility] for removal of his tunneled catheter.  The patient was referred for this by the dialysis center.  The patient reports the function of the access has been stable. Patient denies difficulty with cannulation. The patient denies increased bleeding time after removing the needles. The patient denies hand pain or other symptoms consistent with steal phenomena.  No significant arm swelling.  The patient denies any complaints from the dialysis center or their nephrologist.  The patient denies redness or swelling at the access site. The patient denies fever or chills at home or while on dialysis.  The patient denies amaurosis fugax or recent TIA symptoms. There are no recent neurological changes noted. There is no history of DVT, PE or superficial thrombophlebitis. No recent episodes of angina or shortness of breath documented.        No outpatient medications have been marked as taking for the 11/21/21 encounter [facility] Encounter).    Past Medical History:  Diagnosis Date   Anemia of chronic renal failure    Anoxic encephalopathy (HCC)    Anxiety    Aortic atherosclerosis ([place])    Cannabinoid hyperemesis syndrome    Cardiac arrest ([place]) 06/01/2021   a.) PEA arrest in the setting of metabolic derangements; required intubation and CRRT.   Cardiomyopathy ([place]) 06/01/2021   a.) TTE 06/01/2021: EF 35-40%.   CHF (congestive heart failure) ([place])    a.) TTE 06/01/2021: EF 35-40%; global HK, mild LA enlargement; GLS -11.3%; ; mild MR/AR; G2DD. b.) TTE 06/05/2021: EF 35-40%; PASP 31.6 mmHg; mild MR, mild-mod TR.   COVID-19 05/22/2020   Depression    Diabetes 1.5, managed as type 1 ([place])    DKA (diabetic ketoacidoses) 05/21/2016   ESRD (end stage renal disease) on dialysis ([place])     Gastroparesis    GERD (gastroesophageal reflux disease)    History of 2019 novel coronavirus disease (COVID-19) 05/22/2021   HTN (hypertension)    Marijuana use    Nicotine dependence    PAF (paroxysmal atrial fibrillation) (HCC)    a.) CHA2DS2-VASc = 4 (CHF, HTN, aortic plaque, T1DM). b.) rate/rhythm maintained on oral carvedilol + amiodarone; no chronic anticoagulation due to anemia and thrombocytopenia   Perirectal abscess 06/16/2017   Right arm cellulitis 01/26/2018   Thrombocytopenia (HCC)     Past Surgical History:  Procedure Laterality Date   AMPUTATION TOE Right 08/25/2020   partial 5th toe   AV FISTULA PLACEMENT Left 09/26/2021   Procedure: INSERTION OF ARTERIOVENOUS (AV) GORE-TEX GRAFT ARM (BRACHIAL AXILLARY );  Surgeon: Algernon Huxley, MD;  Location: [facility] ORS;  Service: Vascular;  Laterality: Left;   COLONOSCOPY WITH PROPOFOL N/A 06/21/2021   Procedure: COLONOSCOPY WITH PROPOFOL;  Surgeon: Lin Landsman, MD;  Location: [facility] ENDOSCOPY;  Service: Gastroenterology;  Laterality: N/A;   DEBRIDEMENT  FOOT Right 11/01/2019   wound   DIALYSIS/PERMA CATHETER INSERTION N/A 06/15/2021   Procedure: DIALYSIS/PERMA CATHETER INSERTION;  Surgeon: Algernon Huxley, MD;  Location: [place] [facility];  Service: Cardiovascular;  Laterality: N/A;   DIALYSIS/PERMA CATHETER INSERTION N/A 06/19/2021   Procedure: DIALYSIS/PERMA CATHETER INSERTION;  Surgeon: Algernon Huxley, MD;  Location: [place] [facility];  Service: Cardiovascular;  Laterality: N/A;   DIALYSIS/PERMA CATHETER REMOVAL N/A  11/21/2021   Procedure: DIALYSIS/PERMA CATHETER REMOVAL;  Surgeon: Katha Cabal, MD;  Location: [place] [facility];  Service: Cardiovascular;  Laterality: N/A;   INCISION AND DRAINAGE PERIRECTAL ABSCESS N/A 06/16/2017   Procedure: IRRIGATION AND DEBRIDEMENT PERIRECTAL ABSCESS;  Surgeon: Florene Glen, MD;  Location: [facility] ORS;  Service: General;  Laterality: N/A;   none      Social History Social  History   Tobacco Use   Smoking status: Every Day    Packs/day: 0.50    Types: Cigarettes   Smokeless tobacco: Never  Vaping Use   Vaping Use: Never used  Substance Use Topics   Alcohol use: No   Drug use: Yes    Types: Marijuana    Comment: + cocaine UDS 08/25/2020; negatives after    Family History Family History  Problem Relation Age of Onset   Diabetes Mother     No Known Allergies   REVIEW OF SYSTEMS (Negative unless checked)  Constitutional: [] Weight loss  [] Fever  [] Chills Cardiac: [] Chest pain   [] Chest pressure   [] Palpitations   [] Shortness of breath when laying flat   [] Shortness of breath with exertion. Vascular:  [] Pain in legs with walking   [] Pain in legs at rest  [] History of DVT   [] Phlebitis   [] Swelling in legs   [] Varicose veins   [] Non-healing ulcers Pulmonary:   [] Uses home oxygen   [] Productive cough   [] Hemoptysis   [] Wheeze  [] COPD   [] Asthma Neurologic:  [] Dizziness   [] Seizures   [] History of stroke   [] History of TIA  [] Aphasia   [] Vissual changes   [] Weakness or numbness in arm   [] Weakness or numbness in leg Musculoskeletal:   [] Joint swelling   [] Joint pain   [] Low back pain Hematologic:  [] Easy bruising  [] Easy bleeding   [] Hypercoagulable state   [] Anemic Gastrointestinal:  [] Diarrhea   [] Vomiting  [] Gastroesophageal reflux/heartburn   [] Difficulty swallowing. Genitourinary:  [x] Chronic kidney disease   [] Difficult urination  [] Frequent urination   [] Blood in urine Skin:  [] Rashes   [] Ulcers  Psychological:  [] History of anxiety   []  History of major depression.  Physical Examination  Vitals:   11/21/21 1429 11/21/21 1457  BP: (!) 151/98 (!) 155/101  Pulse: 100 97  Resp: 20 20  Temp: 98.1 F (36.7 C)   SpO2: 98% 99%  Weight: 63.5 kg   Height: 5\' 9"  (1.753 m)    Body mass index is 20.67 kg/m. Gen: WD/WN, NAD Head: [place]/AT, No temporalis wasting.  Ear/Nose/Throat: Hearing grossly intact, nares w/o erythema or drainage Eyes: PER,  EOMI, sclera nonicteric.  Neck: Supple, no gross masses or lesions.  No JVD.  Pulmonary:  Good air movement, no audible wheezing, no use of accessory muscles.  Cardiac: RRR, precordium non-hyperdynamic. Vascular:   Right IJ tunnel catheter clean dry and intact left arm AV access good thrill good bruit Vessel Right Left  Radial Palpable Palpable  Brachial Palpable Palpable  Gastrointestinal: soft, non-distended. No guarding/no peritoneal signs.  Musculoskeletal: M/S 5/5 throughout.  No deformity.  Neurologic: CN 2-12 intact. Pain and light touch intact in extremities.  Symmetrical.  Speech is fluent. Motor exam as listed above. Psychiatric: Judgment intact, Mood & affect appropriate for pt's clinical situation. Dermatologic: No rashes or ulcers noted.  No changes consistent with cellulitis.   CBC Lab Results  Component Value Date   WBC 5.8 09/26/2021   HGB 7.9 (L) 09/26/2021   HCT 25.2 (L) 09/26/2021   MCV 88.4 09/26/2021  PLT 259 09/26/2021    BMET    Component Value Date/Time   NA 140 09/26/2021 0957   NA 133 (L) 08/05/2013 1326   K 3.7 09/26/2021 0957   K 3.8 08/05/2013 1326   CL 103 09/26/2021 0957   CL 99 08/05/2013 1326   CO2 28 09/26/2021 0957   CO2 28 08/05/2013 1326   GLUCOSE 183 (H) 09/26/2021 0957   GLUCOSE 301 (H) 08/05/2013 1326   BUN 34 (H) 09/26/2021 0957   BUN 5 (L) 08/05/2013 1326   CREATININE 4.77 (H) 09/26/2021 0957   CREATININE 0.62 08/05/2013 1326   CALCIUM 8.1 (L) 09/26/2021 0957   CALCIUM 8.0 06/12/2020 0515   GFRNONAA 16 (L) 09/26/2021 0957   GFRNONAA >60 08/05/2013 1326   GFRAA 51 (L) 01/02/2020 1718   GFRAA >60 08/05/2013 1326   CrCl cannot be calculated (Patient's most recent lab result is older than the maximum 21 days allowed.).  COAG Lab Results  Component Value Date   INR 1.3 (H) 06/04/2021   INR 2.3 (H) 06/01/2021   INR 1.3 (H) 05/31/2021    Radiology PERIPHERAL VASCULAR CATHETERIZATION  Result Date: 11/21/2021 See surgical  note for result.    Assessment/Plan 1.  End-stage renal disease requiring hemodialysis: The patient now has a functioning fistula no longer needs his tunneled dialysis catheter.  Risks and benefits for catheter removal were reviewed all questions were answered patient has agreed to proceed. 2.  Paroxysmal atrial fibrillation:Continue antiarrhythmia medications as already ordered, these medications have been reviewed and there are no changes at this time. Continue anticoagulation as ordered by Cardiology Service  3.  Hypertension:  Patient will continue medical management; nephrology is following no changes in oral medications. 4. Diabetes mellitus:  Glucose will be monitored and oral medications been held this morning once the patient has undergone the patient's procedure po intake will be reinitiated and again Accu-Cheks will be used to assess the blood glucose level and treat as needed. The patient will be restarted on the patient's usual hypoglycemic regime      Hortencia Pilar, MD  11/21/2021 3:41 PM

## 2021-11-21 NOTE — Op Note (Signed)
  OPERATIVE NOTE   PROCEDURE: Removal of a right IJ tunneled dialysis catheter  PRE-OPERATIVE DIAGNOSIS: Complication of dialysis catheter, End stage renal disease  POST-OPERATIVE DIAGNOSIS: Same  SURGEON: Hortencia Pilar, M.D.  ANESTHESIA: Local anesthetic with 1% lidocaine with epinephrine   ESTIMATED BLOOD LOSS: Minimal   FINDING(S): 1. Catheter intact   SPECIMEN(S):  Catheter  INDICATIONS:   Jonathan Mcmillan is a 26 y.o. male who presents with a functioning left arm access and a nonfunctioning tunneled catheter.  The patient has undergone placement of an extremity access which is working and this has been successfully cannulated without difficulty.  therefore is undergoing removal of his tunneled catheter which is no longer needed to avoid septic complications.   DESCRIPTION: After obtaining full informed written consent, the patient was positioned supine. The right IJ catheter and surrounding area is prepped and draped in a sterile fashion. The cuff was localized by palpation and noted to be less than 3 cm from the exit site. After appropriate timeout is called, 1% lidocaine with epinephrine is infiltrated into the surrounding tissues around the cuff. Small transverse incision is created at the exit site with an 11 blade scalpel and the dissection was carried up along the catheter to expose the cuff of the tunneled catheter.  The catheter cuff is then freed from the surrounding attachments and adhesions. Once the catheter has been freed circumferentially it is removed in 1 piece. Light pressure was held at the base of the neck.   Antibiotic ointment and a sterile dressing is applied to the exit site. Patient tolerated procedure well and there were no complications.  COMPLICATIONS: None  CONDITION: Unchanged  Hortencia Pilar, M.D. [facility] Office: [phone]  11/21/2021,3:56 PM

## 2021-11-21 NOTE — H&P (View-Only) (Signed)
MRN : [id number]  Jonathan Mcmillan is a 26 y.o. ([birth year]) male who presents with chief complaint of check access.  History of Present Illness:  Patient presents to [facility] for removal of his tunneled catheter.  The patient was referred for this by the dialysis center.  The patient reports the function of the access has been stable. Patient denies difficulty with cannulation. The patient denies increased bleeding time after removing the needles. The patient denies hand pain or other symptoms consistent with steal phenomena.  No significant arm swelling.  The patient denies any complaints from the dialysis center or their nephrologist.  The patient denies redness or swelling at the access site. The patient denies fever or chills at home or while on dialysis.  The patient denies amaurosis fugax or recent TIA symptoms. There are no recent neurological changes noted. There is no history of DVT, PE or superficial thrombophlebitis. No recent episodes of angina or shortness of breath documented.        No outpatient medications have been marked as taking for the 11/21/21 encounter [facility] Encounter).    Past Medical History:  Diagnosis Date   Anemia of chronic renal failure    Anoxic encephalopathy (HCC)    Anxiety    Aortic atherosclerosis ([place])    Cannabinoid hyperemesis syndrome    Cardiac arrest ([place]) 06/01/2021   a.) PEA arrest in the setting of metabolic derangements; required intubation and CRRT.   Cardiomyopathy ([place]) 06/01/2021   a.) TTE 06/01/2021: EF 35-40%.   CHF (congestive heart failure) ([place])    a.) TTE 06/01/2021: EF 35-40%; global HK, mild LA enlargement; GLS -11.3%; ; mild MR/AR; G2DD. b.) TTE 06/05/2021: EF 35-40%; PASP 31.6 mmHg; mild MR, mild-mod TR.   COVID-19 05/22/2020   Depression    Diabetes 1.5, managed as type 1 ([place])    DKA (diabetic ketoacidoses) 05/21/2016   ESRD (end stage renal disease) on dialysis ([place])     Gastroparesis    GERD (gastroesophageal reflux disease)    History of 2019 novel coronavirus disease (COVID-19) 05/22/2021   HTN (hypertension)    Marijuana use    Nicotine dependence    PAF (paroxysmal atrial fibrillation) (HCC)    a.) CHA2DS2-VASc = 4 (CHF, HTN, aortic plaque, T1DM). b.) rate/rhythm maintained on oral carvedilol + amiodarone; no chronic anticoagulation due to anemia and thrombocytopenia   Perirectal abscess 06/16/2017   Right arm cellulitis 01/26/2018   Thrombocytopenia (HCC)     Past Surgical History:  Procedure Laterality Date   AMPUTATION TOE Right 08/25/2020   partial 5th toe   AV FISTULA PLACEMENT Left 09/26/2021   Procedure: INSERTION OF ARTERIOVENOUS (AV) GORE-TEX GRAFT ARM (BRACHIAL AXILLARY );  Surgeon: Algernon Huxley, MD;  Location: [facility] ORS;  Service: Vascular;  Laterality: Left;   COLONOSCOPY WITH PROPOFOL N/A 06/21/2021   Procedure: COLONOSCOPY WITH PROPOFOL;  Surgeon: Lin Landsman, MD;  Location: [facility] ENDOSCOPY;  Service: Gastroenterology;  Laterality: N/A;   DEBRIDEMENT  FOOT Right 11/01/2019   wound   DIALYSIS/PERMA CATHETER INSERTION N/A 06/15/2021   Procedure: DIALYSIS/PERMA CATHETER INSERTION;  Surgeon: Algernon Huxley, MD;  Location: [place] [facility];  Service: Cardiovascular;  Laterality: N/A;   DIALYSIS/PERMA CATHETER INSERTION N/A 06/19/2021   Procedure: DIALYSIS/PERMA CATHETER INSERTION;  Surgeon: Algernon Huxley, MD;  Location: [place] [facility];  Service: Cardiovascular;  Laterality: N/A;   DIALYSIS/PERMA CATHETER REMOVAL N/A  11/21/2021   Procedure: DIALYSIS/PERMA CATHETER REMOVAL;  Surgeon: Katha Cabal, MD;  Location: [place] [facility];  Service: Cardiovascular;  Laterality: N/A;   INCISION AND DRAINAGE PERIRECTAL ABSCESS N/A 06/16/2017   Procedure: IRRIGATION AND DEBRIDEMENT PERIRECTAL ABSCESS;  Surgeon: Florene Glen, MD;  Location: [facility] ORS;  Service: General;  Laterality: N/A;   none      Social History Social  History   Tobacco Use   Smoking status: Every Day    Packs/day: 0.50    Types: Cigarettes   Smokeless tobacco: Never  Vaping Use   Vaping Use: Never used  Substance Use Topics   Alcohol use: No   Drug use: Yes    Types: Marijuana    Comment: + cocaine UDS 08/25/2020; negatives after    Family History Family History  Problem Relation Age of Onset   Diabetes Mother     No Known Allergies   REVIEW OF SYSTEMS (Negative unless checked)  Constitutional: [] Weight loss  [] Fever  [] Chills Cardiac: [] Chest pain   [] Chest pressure   [] Palpitations   [] Shortness of breath when laying flat   [] Shortness of breath with exertion. Vascular:  [] Pain in legs with walking   [] Pain in legs at rest  [] History of DVT   [] Phlebitis   [] Swelling in legs   [] Varicose veins   [] Non-healing ulcers Pulmonary:   [] Uses home oxygen   [] Productive cough   [] Hemoptysis   [] Wheeze  [] COPD   [] Asthma Neurologic:  [] Dizziness   [] Seizures   [] History of stroke   [] History of TIA  [] Aphasia   [] Vissual changes   [] Weakness or numbness in arm   [] Weakness or numbness in leg Musculoskeletal:   [] Joint swelling   [] Joint pain   [] Low back pain Hematologic:  [] Easy bruising  [] Easy bleeding   [] Hypercoagulable state   [] Anemic Gastrointestinal:  [] Diarrhea   [] Vomiting  [] Gastroesophageal reflux/heartburn   [] Difficulty swallowing. Genitourinary:  [x] Chronic kidney disease   [] Difficult urination  [] Frequent urination   [] Blood in urine Skin:  [] Rashes   [] Ulcers  Psychological:  [] History of anxiety   []  History of major depression.  Physical Examination  Vitals:   11/21/21 1429 11/21/21 1457  BP: (!) 151/98 (!) 155/101  Pulse: 100 97  Resp: 20 20  Temp: 98.1 F (36.7 C)   SpO2: 98% 99%  Weight: 63.5 kg   Height: 5\' 9"  (1.753 m)    Body mass index is 20.67 kg/m. Gen: WD/WN, NAD Head: [place]/AT, No temporalis wasting.  Ear/Nose/Throat: Hearing grossly intact, nares w/o erythema or drainage Eyes: PER,  EOMI, sclera nonicteric.  Neck: Supple, no gross masses or lesions.  No JVD.  Pulmonary:  Good air movement, no audible wheezing, no use of accessory muscles.  Cardiac: RRR, precordium non-hyperdynamic. Vascular:   Right IJ tunnel catheter clean dry and intact left arm AV access good thrill good bruit Vessel Right Left  Radial Palpable Palpable  Brachial Palpable Palpable  Gastrointestinal: soft, non-distended. No guarding/no peritoneal signs.  Musculoskeletal: M/S 5/5 throughout.  No deformity.  Neurologic: CN 2-12 intact. Pain and light touch intact in extremities.  Symmetrical.  Speech is fluent. Motor exam as listed above. Psychiatric: Judgment intact, Mood & affect appropriate for pt's clinical situation. Dermatologic: No rashes or ulcers noted.  No changes consistent with cellulitis.   CBC Lab Results  Component Value Date   WBC 5.8 09/26/2021   HGB 7.9 (L) 09/26/2021   HCT 25.2 (L) 09/26/2021   MCV 88.4 09/26/2021  PLT 259 09/26/2021    BMET    Component Value Date/Time   NA 140 09/26/2021 0957   NA 133 (L) 08/05/2013 1326   K 3.7 09/26/2021 0957   K 3.8 08/05/2013 1326   CL 103 09/26/2021 0957   CL 99 08/05/2013 1326   CO2 28 09/26/2021 0957   CO2 28 08/05/2013 1326   GLUCOSE 183 (H) 09/26/2021 0957   GLUCOSE 301 (H) 08/05/2013 1326   BUN 34 (H) 09/26/2021 0957   BUN 5 (L) 08/05/2013 1326   CREATININE 4.77 (H) 09/26/2021 0957   CREATININE 0.62 08/05/2013 1326   CALCIUM 8.1 (L) 09/26/2021 0957   CALCIUM 8.0 06/12/2020 0515   GFRNONAA 16 (L) 09/26/2021 0957   GFRNONAA >60 08/05/2013 1326   GFRAA 51 (L) 01/02/2020 1718   GFRAA >60 08/05/2013 1326   CrCl cannot be calculated (Patient's most recent lab result is older than the maximum 21 days allowed.).  COAG Lab Results  Component Value Date   INR 1.3 (H) 06/04/2021   INR 2.3 (H) 06/01/2021   INR 1.3 (H) 05/31/2021    Radiology PERIPHERAL VASCULAR CATHETERIZATION  Result Date: 11/21/2021 See surgical  note for result.    Assessment/Plan 1.  End-stage renal disease requiring hemodialysis: The patient now has a functioning fistula no longer needs his tunneled dialysis catheter.  Risks and benefits for catheter removal were reviewed all questions were answered patient has agreed to proceed. 2.  Paroxysmal atrial fibrillation:Continue antiarrhythmia medications as already ordered, these medications have been reviewed and there are no changes at this time. Continue anticoagulation as ordered by Cardiology Service  3.  Hypertension:  Patient will continue medical management; nephrology is following no changes in oral medications. 4. Diabetes mellitus:  Glucose will be monitored and oral medications been held this morning once the patient has undergone the patient's procedure po intake will be reinitiated and again Accu-Cheks will be used to assess the blood glucose level and treat as needed. The patient will be restarted on the patient's usual hypoglycemic regime      Hortencia Pilar, MD  11/21/2021 3:41 PM

## 2021-11-23 ENCOUNTER — Ambulatory Visit: Payer: Medicare Other | Admitting: Physician Assistant

## 2021-12-19 ENCOUNTER — Encounter: Payer: Medicare Other | Attending: Physician Assistant | Admitting: Physician Assistant

## 2021-12-19 DIAGNOSIS — F17200 Nicotine dependence, unspecified, uncomplicated: Secondary | ICD-10-CM | POA: Insufficient documentation

## 2021-12-19 DIAGNOSIS — I2510 Atherosclerotic heart disease of native coronary artery without angina pectoris: Secondary | ICD-10-CM | POA: Insufficient documentation

## 2021-12-19 DIAGNOSIS — L89894 Pressure ulcer of other site, stage 4: Secondary | ICD-10-CM | POA: Diagnosis not present

## 2021-12-19 DIAGNOSIS — E10621 Type 1 diabetes mellitus with foot ulcer: Secondary | ICD-10-CM | POA: Insufficient documentation

## 2021-12-19 DIAGNOSIS — Z992 Dependence on renal dialysis: Secondary | ICD-10-CM | POA: Diagnosis not present

## 2021-12-19 DIAGNOSIS — L97529 Non-pressure chronic ulcer of other part of left foot with unspecified severity: Secondary | ICD-10-CM | POA: Insufficient documentation

## 2021-12-19 DIAGNOSIS — G931 Anoxic brain damage, not elsewhere classified: Secondary | ICD-10-CM | POA: Insufficient documentation

## 2021-12-19 DIAGNOSIS — L89154 Pressure ulcer of sacral region, stage 4: Secondary | ICD-10-CM | POA: Diagnosis present

## 2021-12-19 DIAGNOSIS — I120 Hypertensive chronic kidney disease with stage 5 chronic kidney disease or end stage renal disease: Secondary | ICD-10-CM | POA: Diagnosis not present

## 2021-12-19 DIAGNOSIS — N186 End stage renal disease: Secondary | ICD-10-CM | POA: Insufficient documentation

## 2021-12-19 DIAGNOSIS — E1022 Type 1 diabetes mellitus with diabetic chronic kidney disease: Secondary | ICD-10-CM | POA: Diagnosis not present

## 2021-12-19 NOTE — Progress Notes (Addendum)
RYKAR, LEBLEU ([id number]) Visit Report for 12/19/2021 Chief Complaint Document Details Patient Name: Jonathan Mcmillan, Jonathan Mcmillan. Date of Service: 12/19/2021 1:15 PM Medical Record Number: [id number] Patient Account Number: [id number] Date of Birth/Sex: Jan 31, 1996 (26 y.o. M) Treating RN: Carlene Coria Primary Care Provider: Denton Lank Other Clinician: Massie Kluver Referring Provider: Denton Lank Treating Provider/Extender: Skipper Cliche in Treatment: 22 Information Obtained from: Patient Chief Complaint Left lateral foot ulcer Electronic Signature(s) Signed: 12/19/2021 1:32:55 PM By: Worthy Keeler PA-C Previous Signature: 12/19/2021 1:32:19 PM Version By: Worthy Keeler PA-C Entered By: Worthy Keeler on 12/19/2021 13:32:55 Jonathan Mcmillan ([id number]) -------------------------------------------------------------------------------- HPI Details Patient Name: Jonathan Mcmillan. Date of Service: 12/19/2021 1:15 PM Medical Record Number: [id number] Patient Account Number: [id number] Date of Birth/Sex: 1996/02/03 (26 y.o. M) Treating RN: Carlene Coria Primary Care Provider: Denton Lank Other Clinician: Massie Kluver Referring Provider: Denton Lank Treating Provider/Extender: Skipper Cliche in Treatment: 22 History of Present Illness HPI Description: 07/14/2021 patient presents today for initial inspection here in the clinic concerning an issue that he has been having with wounds over the left lateral foot, right knee, and the largest of these is the sacral region where there is significant necrotic tissue noted in the base of this wound. Subsequently the patient is referred to [place] from what appears to be primary care in order for further management of his wounds. He also sees physical medicine and rehabilitation. I did receive a lot of the notes from there as far as what is been going on with regard to this gentleman. Patient is a 26 year old male who had an anoxic encephalopathy,  end-stage renal disease on hemodialysis, he has been disabled with generalized weakness and subsequently developed a sacral decubitus ulcer which apparently was present before he went into the hospital according to what is not tells me but worsened during the time in the ICU simply because he was unable to be turned with all the equipment that he was hooked up to. Again it sounds like they did what they could to try to keep this under control but nonetheless due to the severity of his illness it sounds as if he is lucky to be alive to be perfectly honest. Subsequently the patient also does have diabetes mellitus, hypertension, nicotine dependence, he did go to the emergency department on 07/05/2021 due to a "abscess" in the gluteal region. The family member had seen this and wanted him to be checked out. During that time medical staff wanted to admit the patient but he refused. On 07/06/2021 the patient was subsequently discharged from the [facility] [place] and apparently they were trying to get in touch according to the notes with his aunt who is the legal guardian but were not able to. That was around 12:37 AM. Nonetheless in the interim since they have been basically at home using Santyl which was the recommendation from the hospital. This has been on all locations. Dry dressings have been applied following. Unfortunately this just does not seem to be doing quite enough especially for the sacral area which is a significant wound to be honest. We do not have any sign of an x-ray that was done of the sacral area nor that they know of anything in particular that was done at the hospital. With all that being said I think an x-ray would be a good idea based on what I am seeing currently this does appear to be quite significant from a wound care perspective. 3/14; patient  presents for follow-up. He has no issues or complaints today. He has been using Dakin's wet-to-dry dressings to the wound  beds. He denies signs of infection. He reports Currently taking Augmentin. 08/08/2021 upon evaluation today patient actually appears to be doing better in regard to his wounds in the sacral region as well as the knee both of which are showing signs of excellent improvement. In regard to his foot I am not certain exactly what his blood flow is at this point. That something but I am going to spend a little bit of time looking into the chart regarding. Fortunately I do not see any signs of infection but I do believe he is probably can need some sharp debridement here but I do not want to perform this until we can confirm good blood flow. I know we were able to do this first visit because he was in the bed in the room where we can get him in position. We also were not able to do at last visit as we did not end up actually seeing him due to some incontinence issues. Nonetheless I think that we need to plan for ABIs at the next visit. 08-17-2021 upon evaluation today patient appears to be doing well with regard to his sacral wound as well as the knee the knee is completely healed. In regard to the foot he does have need for sharp debridement today. I am going to do that he is noncompressible but nonetheless has good pulses and good capillary refill there is no evidence of compromise here although he does have a lot of necrotic tissue we need to help clean away here. 08-24-2021 upon evaluation today patient's sacral region is actually doing quite well. Unfortunately the left lateral foot area is not doing nearly as well there is a lot of necrotic tissue here including necrotic muscle which I would have to attempt to remove today. Overall I do feel like that the patient is showing some signs of improvement were still holding as far as his insurance is concerned and the MRI being scheduled apparently we do not have documentation of insurance that will cover for the MRI and therefore they are not scheduling it at  this point. Nonetheless his aunt does need to figure this out as soon as possible as we really do need to look into getting this MRI done ASAP with regard to the left foot. 08-31-2021 upon evaluation today patient appears to be doing better in regard to his foot ulcer though he still has a significant amount of necrotic tissue. Apparently during the conversation today it became apparent that he has not even taken the Augmentin I previously prescribed for him. Again his x-ray did show evidence of potential for osteomyelitis but we had ordered an MRI but at the same time he was unable to get this as apparently he does not have insurance that covers anything that we are actually investigating and doing at this point. It was going to be close to $700 and it appears that he and the family have decided not to do this. Nonetheless he is going to need antibiotic treatment and really for at least 6 weeks based on what we are seeing. It looks like Omnicef should be appropriate based on the culture results from last week he is also going require some debridement today 4/27; large wound on the left lateral foot. Undermining but about the same in terms of measurements as last week. According to our intake nurse condition of  the wound bed looks better. Similarly the area on the sacrum seems to be filling and inferiorly although there is still under mining at 12:00. Using Dakin's wet-to-dry 5/10; patient presents for follow-up. He has no issues or complaints today. He states he has some days left to finish his antibiotic course. He has been using Dakin's wet-to-dry dressings. He denies signs of infection. 10-05-2021 upon evaluation today patient appears to be doing well with regard to his wounds. He has been tolerating the dressing changes without complication. Fortunately I do not see any evidence of infection locally or systemically which is great news. No fevers, chills, nausea, vomiting, or diarrhea. 10-19-2021  upon evaluation today patient appears to be doing well with regard to his wounds. Both are showing signs of improvement the one in the gluteal area is just a small slit almost completely closed. 1 in the lateral portion of his foot is much better and overall I am very pleased I think there is just a very tiny area this can require some sharp debridement the remainder looks to be doing awesome. 11-07-2021 upon evaluation today patient's wound actually on the gluteal/sacral region actually appears to be completely healed which is great Jonathan Mcmillan, Jonathan Mcmillan. ([id number]) news. I do not see any evidence of active infection locally or systemically which is great news. With that being said the wound on the foot area did not have a dressing on and the patient tells me it has not had 1 since Saturday. He tells me his aunt broke her ankle and therefore has not been able to help him for that reason I Georgina Peer try to find something that he can do on his own and will not have to rely on her to change. I think Hydrofera Blue may be a good option in that regard. The patient voiced understanding and agreement with giving that a try. 12-19-2021 upon evaluation today patient appears to be doing well with regard to his foot ulcer. The good news is I think he is completely healed. I do not see any evidence of infection at the moment. Electronic Signature(s) Signed: 12/19/2021 2:01:20 PM By: Worthy Keeler PA-C Entered By: Worthy Keeler on 12/19/2021 14:01:20 Jonathan Mcmillan ([id number]) -------------------------------------------------------------------------------- Physical Exam Details Patient Name: Jonathan Mcmillan, Jonathan Mcmillan. Date of Service: 12/19/2021 1:15 PM Medical Record Number: [id number] Patient Account Number: [id number] Date of Birth/Sex: 03/11/1996 (26 y.o. M) Treating RN: Carlene Coria Primary Care Provider: Denton Lank Other Clinician: Massie Kluver Referring Provider: Denton Lank Treating Provider/Extender: Jeri Cos Weeks in Treatment: 67 Constitutional Well-nourished and well-hydrated in no acute distress. Respiratory normal breathing without difficulty. Psychiatric this patient is able to make decisions and demonstrates good insight into disease process. Alert and Oriented x 3. pleasant and cooperative. Notes Upon inspection the patient had a lot of hard/callus-like tissue over the lateral foot. I did double check this with a curette just to make sure there is nothing open that did not appear to be any open wound and I did go ahead and closed out the wound today. He is doing really well. Electronic Signature(s) Signed: 12/19/2021 2:01:39 PM By: Worthy Keeler PA-C Entered By: Worthy Keeler on 12/19/2021 14:01:39 Jonathan Mcmillan ([id number]) -------------------------------------------------------------------------------- Physician Orders Details Patient Name: Jonathan Mcmillan, Jonathan Mcmillan. Date of Service: 12/19/2021 1:15 PM Medical Record Number: [id number] Patient Account Number: [id number] Date of Birth/Sex: 1996/04/01 (26 y.o. M) Treating RN: Carlene Coria Primary Care Provider: Denton Lank Other Clinician: Massie Kluver Referring Provider: Denton Lank Treating  Provider/Extender: Jeri Cos Weeks in Treatment: 22 Verbal / Phone Orders: No Diagnosis Coding ICD-10 Coding Code Description L89.154 Pressure ulcer of sacral region, stage 4 L89.894 Pressure ulcer of other site, stage 4 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, not elsewhere classified I25.10 Atherosclerotic heart disease of native coronary artery without angina pectoris Discharge From [facility] o Discharge from [place] [facility] Complete - wound healed. Please call if any further issues arise Electronic Signature(s) Signed: 12/19/2021 2:41:05 PM By: Massie Kluver Signed: 12/19/2021 3:58:52 PM By: Worthy Keeler PA-C Entered By: Massie Kluver on 12/19/2021 14:01:00 Jonathan Mcmillan  ([id number]) -------------------------------------------------------------------------------- Problem List Details Patient Name: Jonathan Mcmillan, Jonathan Mcmillan. Date of Service: 12/19/2021 1:15 PM Medical Record Number: [id number] Patient Account Number: [id number] Date of Birth/Sex: Feb 28, 1996 (26 y.o. M) Treating RN: Carlene Coria Primary Care Provider: Denton Lank Other Clinician: Massie Kluver Referring Provider: Denton Lank Treating Provider/Extender: Skipper Cliche in Treatment: 22 Active Problems ICD-10 Encounter Code Description Active Date MDM Diagnosis L89.154 Pressure ulcer of sacral region, stage 4 07/14/2021 No Yes L89.894 Pressure ulcer of other site, stage 4 07/14/2021 No Yes 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, not elsewhere classified 07/14/2021 No Yes I25.10 Atherosclerotic heart disease of native coronary artery without angina 07/14/2021 No Yes pectoris Inactive Problems Resolved Problems Electronic Signature(s) Signed: 12/19/2021 1:31:49 PM By: Worthy Keeler PA-C Entered By: Worthy Keeler on 12/19/2021 13:31:49 Jonathan Mcmillan ([id number]) -------------------------------------------------------------------------------- Progress Note Details Patient Name: Jonathan Mcmillan. Date of Service: 12/19/2021 1:15 PM Medical Record Number: [id number] Patient Account Number: [id number] Date of Birth/Sex: [birth year] (26 y.o. M) Treating RN: Carlene Coria Primary Care Provider: Denton Lank Other Clinician: Massie Kluver Referring Provider: Denton Lank Treating Provider/Extender: Skipper Cliche in Treatment: 20 Subjective Chief Complaint Information obtained from Patient Left lateral foot ulcer History of Present Illness (HPI) 07/14/2021 patient presents today for initial inspection here in the clinic concerning an issue that he has been having with wounds over the left lateral foot, right knee, and the largest of these is the sacral region where there is significant necrotic tissue noted in  the base of this wound. Subsequently the patient is referred to [place] from what appears to be primary care in order for further management of his wounds. He also sees physical medicine and rehabilitation. I did receive a lot of the notes from there as far as what is been going on with regard to this gentleman. Patient is a 26 year old male who had an anoxic encephalopathy, end-stage renal disease on hemodialysis, he has been disabled with generalized weakness and subsequently developed a sacral decubitus ulcer which apparently was present before he went into the hospital according to what is not tells me but worsened during the time in the ICU simply because he was unable to be turned with all the equipment that he was hooked up to. Again it sounds like they did what they could to try to keep this under control but nonetheless due to the severity of his illness it sounds as if he is lucky to be alive to be perfectly honest. Subsequently the patient also does have diabetes mellitus, hypertension, nicotine dependence, he did go to the emergency department on 07/05/2021 due to a "abscess" in the gluteal region. The family member had seen this and wanted him to be checked out. During that time medical staff wanted to admit the patient but he refused. On 07/06/2021 the patient was subsequently discharged from the hospital [place] and apparently they were trying to  get in touch according to the notes with his aunt who is the legal guardian but were not able to. That was around 12:37 AM. Nonetheless in the interim since they have been basically at home using Santyl which was the recommendation from the hospital. This has been on all locations. Dry dressings have been applied following. Unfortunately this just does not seem to be doing quite enough especially for the sacral area which is a significant wound to be honest. We do not have any sign of an x-ray that was done of the sacral area nor that  they know of anything in particular that was done at the hospital. With all that being said I think an x-ray would be a good idea based on what I am seeing currently this does appear to be quite significant from a wound care perspective. 3/14; patient presents for follow-up. He has no issues or complaints today. He has been using Dakin's wet-to-dry dressings to the wound beds. He denies signs of infection. He reports Currently taking Augmentin. 08/08/2021 upon evaluation today patient actually appears to be doing better in regard to his wounds in the sacral region as well as the knee both of which are showing signs of excellent improvement. In regard to his foot I am not certain exactly what his blood flow is at this point. That something but I am going to spend a little bit of time looking into the chart regarding. Fortunately I do not see any signs of infection but I do believe he is probably can need some sharp debridement here but I do not want to perform this until we can confirm good blood flow. I know we were able to do this first visit because he was in the bed in the room where we can get him in position. We also were not able to do at last visit as we did not end up actually seeing him due to some incontinence issues. Nonetheless I think that we need to plan for ABIs at the next visit. 08-17-2021 upon evaluation today patient appears to be doing well with regard to his sacral wound as well as the knee the knee is completely healed. In regard to the foot he does have need for sharp debridement today. I am going to do that he is noncompressible but nonetheless has good pulses and good capillary refill there is no evidence of compromise here although he does have a lot of necrotic tissue we need to help clean away here. 08-24-2021 upon evaluation today patient's sacral region is actually doing quite well. Unfortunately the left lateral foot area is not doing nearly as well there is a lot of  necrotic tissue here including necrotic muscle which I would have to attempt to remove today. Overall I do feel like that the patient is showing some signs of improvement were still holding as far as his insurance is concerned and the MRI being scheduled apparently we do not have documentation of insurance that will cover for the MRI and therefore they are not scheduling it at this point. Nonetheless his aunt does need to figure this out as soon as possible as we really do need to look into getting this MRI done ASAP with regard to the left foot. 08-31-2021 upon evaluation today patient appears to be doing better in regard to his foot ulcer though he still has a significant amount of necrotic tissue. Apparently during the conversation today it became apparent that he has not even taken the  Augmentin I previously prescribed for him. Again his x-ray did show evidence of potential for osteomyelitis but we had ordered an MRI but at the same time he was unable to get this as apparently he does not have insurance that covers anything that we are actually investigating and doing at this point. It was going to be close to $700 and it appears that he and the family have decided not to do this. Nonetheless he is going to need antibiotic treatment and really for at least 6 weeks based on what we are seeing. It looks like Omnicef should be appropriate based on the culture results from last week he is also going require some debridement today 4/27; large wound on the left lateral foot. Undermining but about the same in terms of measurements as last week. According to our intake nurse condition of the wound bed looks better. Similarly the area on the sacrum seems to be filling and inferiorly although there is still under mining at 12:00. Using Dakin's wet-to-dry 5/10; patient presents for follow-up. He has no issues or complaints today. He states he has some days left to finish his antibiotic course. He has been  using Dakin's wet-to-dry dressings. He denies signs of infection. 10-05-2021 upon evaluation today patient appears to be doing well with regard to his wounds. He has been tolerating the dressing changes without complication. Fortunately I do not see any evidence of infection locally or systemically which is great news. No fevers, chills, nausea, vomiting, or diarrhea. 10-19-2021 upon evaluation today patient appears to be doing well with regard to his wounds. Both are showing signs of improvement the one in [place]. ([id number]) the gluteal area is just a small slit almost completely closed. 1 in the lateral portion of his foot is much better and overall I am very pleased I think there is just a very tiny area this can require some sharp debridement the remainder looks to be doing awesome. 11-07-2021 upon evaluation today patient's wound actually on the gluteal/sacral region actually appears to be completely healed which is great news. I do not see any evidence of active infection locally or systemically which is great news. With that being said the wound on the foot area did not have a dressing on and the patient tells me it has not had 1 since Saturday. He tells me his aunt broke her ankle and therefore has not been able to help him for that reason I Georgina Peer try to find something that he can do on his own and will not have to rely on her to change. I think Hydrofera Blue may be a good option in that regard. The patient voiced understanding and agreement with giving that a try. 12-19-2021 upon evaluation today patient appears to be doing well with regard to his foot ulcer. The good news is I think he is completely healed. I do not see any evidence of infection at the moment. Objective Constitutional Well-nourished and well-hydrated in no acute distress. Vitals Time Taken: 1:35 PM, Height: 68 in, Weight: 146 lbs, BMI: 22.2, Temperature: 98.4 F, Pulse: 94 bpm, Respiratory Rate: 16  breaths/min, Blood Pressure: 168/86 mmHg. General Notes: Patient has not taken blood pressure medication today Respiratory normal breathing without difficulty. Psychiatric this patient is able to make decisions and demonstrates good insight into disease process. Alert and Oriented x 3. pleasant and cooperative. General Notes: Upon inspection the patient had a lot of hard/callus-like tissue over the lateral foot. I did double check this  with a curette just to make sure there is nothing open that did not appear to be any open wound and I did go ahead and closed out the wound today. He is doing really well. Integumentary (Hair, Skin) Wound #2 status is Healed - Epithelialized. Original cause of wound was Gradually Appeared. The date acquired was: 05/31/2021. The wound has been in treatment 22 weeks. The wound is located on the Left,Lateral Foot. The wound measures 0cm length x 0cm width x 0cm depth; 0cm^2 area and 0cm^3 volume. There is bone, muscle, and Fat Layer (Subcutaneous Tissue) exposed. There is no tunneling or undermining noted. There is a medium amount of serosanguineous drainage noted. There is small (1-33%) pink, hyper - granulation within the wound bed. There is a large (67-100%) amount of necrotic tissue within the wound bed including Eschar. Assessment Active Problems ICD-10 Pressure ulcer of sacral region, stage 4 Pressure ulcer of other site, stage 4 Anoxic brain damage, not elsewhere classified Atherosclerotic heart disease of native coronary artery without angina pectoris Plan Discharge From [facility]: Discharge from [place] Treatment Complete - wound healed. Please call if any further issues arise Jonathan Mcmillan, Jonathan Mcmillan. ([id number]) 1. I would recommend currently that we go ahead and discontinue wound care services as the patient does appear to be completely healed which is great news. Obviously if he has any problems he should contact the office and let me  know. 2. I am also can recommend that we have the patient continue to put some AandD ointment on his area of healed ulcer on the lateral foot this actually seems to be doing well he also notes he has been using Shea butter which I think is okay as well as long as he keeps it moist I think should do quite well. We will see him back for follow-up visit as needed. Electronic Signature(s) Signed: 12/19/2021 2:02:23 PM By: Worthy Keeler PA-C Entered By: Worthy Keeler on 12/19/2021 14:02:23 Jonathan Mcmillan ([id number]) -------------------------------------------------------------------------------- SuperBill Details Patient Name: Jonathan Mcmillan. Date of Service: 12/19/2021 Medical Record Number: [id number] Patient Account Number: [id number] Date of Birth/Sex: Dec 28, 1995 (28 y.o. 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 Facility Procedures CPT4 Code: 85631497 Description: 860 [id number] 2238 - WOUND CARE VISIT-LEV 2 EST PT Modifier: Quantity: 1 Physician Procedures CPT4 Code: 8588502 Description: 99213 - WC PHYS LEVEL 3 - EST PT Modifier: Quantity: 1 CPT4 Code: Description: ICD-10 Diagnosis Description L89.154 Pressure ulcer of sacral region, stage 4 L89.894 Pressure ulcer of other site, stage 4 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

## 2021-12-19 NOTE — Progress Notes (Addendum)
Jonathan Mcmillan, Jonathan Mcmillan ([id number]) Visit Report for 12/19/2021 Arrival Information Details Patient Name: Jonathan Mcmillan. Date of Service: 12/19/2021 1:15 PM Medical Record Number: [id number] Patient Account Number: [id number] Date of Birth/Sex: 09-01-1995 (26 y.o. Male) Treating RN: Carlene Coria Primary Care Elon Eoff: Denton Lank Other Clinician: Massie Kluver Referring Taylon Louison: Denton Lank Treating Maylani Embree/Extender: Skipper Cliche in Treatment: 73 Visit Information History Since Last Visit All ordered tests and consults were completed: No Patient Arrived: Ambulatory Added or deleted any medications: No Arrival Time: 13:34 Any new allergies or adverse reactions: No Transfer Assistance: None Had a fall or experienced change in No Patient Requires Transmission-Based No activities of daily living that may affect Precautions: risk of falls: Patient Has Alerts: Yes Hospitalized since last visit: No Patient Alerts: Type 2 diabetic Pain Present Now: No NON COMPRESSABLE Electronic Signature(s) Signed: 12/19/2021 2:41:05 PM By: Massie Kluver Entered By: Massie Kluver on 12/19/2021 13:35:02 Jonathan Mcmillan ([id number]) -------------------------------------------------------------------------------- Clinic Level of Care Assessment Details Patient Name: Jonathan Mcmillan. Date of Service: 12/19/2021 1:15 PM Medical Record Number: [id number] Patient Account Number: [id number] Date of Birth/Sex: [birth year] (26 y.o. Male) Treating RN: Carlene Coria Primary Care Luis Nickles: Denton Lank Other Clinician: Massie Kluver Referring Shawneequa Baldridge: Denton Lank Treating Elsi Stelzer/Extender: Skipper Cliche in Treatment: 22 Clinic Level of Care Assessment Items TOOL 4 Quantity Score []  - Use when only an EandM is performed on FOLLOW-UP visit 0 ASSESSMENTS - Nursing Assessment / Reassessment X - Reassessment of Co-morbidities (includes updates in patient status) 1 10 X- 1 5 Reassessment of Adherence  to Treatment Plan ASSESSMENTS - Wound and Skin Assessment / Reassessment X - Simple Wound Assessment / Reassessment - one wound 1 5 []  - 0 Complex Wound Assessment / Reassessment - multiple wounds []  - 0 Dermatologic / Skin Assessment (not related to wound area) ASSESSMENTS - Focused Assessment []  - Circumferential Edema Measurements - multi extremities 0 []  - 0 Nutritional Assessment / Counseling / Intervention []  - 0 Lower Extremity Assessment (monofilament, tuning fork, pulses) []  - 0 Peripheral Arterial Disease Assessment (using hand held doppler) ASSESSMENTS - Ostomy and/or Continence Assessment and Care []  - Incontinence Assessment and Management 0 []  - 0 Ostomy Care Assessment and Management (repouching, etc.) PROCESS - Coordination of Care X - Simple Patient / Family Education for ongoing care 1 15 []  - 0 Complex (extensive) Patient / Family Education for ongoing care []  - 0 Staff obtains Programmer, systems, Records, Test Results / Process Orders []  - 0 Staff telephones HHA, Nursing Homes / Clarify orders / etc []  - 0 Routine Transfer to another Facility (non-emergent condition) []  - 0 Routine Hospital Admission (non-emergent condition) []  - 0 New Admissions / Biomedical engineer / Ordering NPWT, Apligraf, etc. []  - 0 Emergency Hospital Admission (emergent condition) X- 1 10 Simple Discharge Coordination []  - 0 Complex (extensive) Discharge Coordination PROCESS - Special Needs []  - Pediatric / Minor Patient Management 0 []  - 0 Isolation Patient Management []  - 0 Hearing / Language / Visual special needs []  - 0 Assessment of Community assistance (transportation, D/C planning, etc.) []  - 0 Additional assistance / Altered mentation []  - 0 Support Surface(s) Assessment (bed, cushion, seat, etc.) INTERVENTIONS - Wound Cleansing / Measurement Jonathan Mcmillan, Jonathan Mcmillan. ([id number]) X- 1 5 Simple Wound Cleansing - one wound []  - 0 Complex Wound Cleansing - multiple  wounds X- 1 5 Wound Imaging (photographs - any number of wounds) []  - 0 Wound Tracing (instead of photographs) []  - 0 Simple Wound Measurement - one  wound []  - 0 Complex Wound Measurement - multiple wounds INTERVENTIONS - Wound Dressings []  - Small Wound Dressing one or multiple wounds 0 []  - 0 Medium Wound Dressing one or multiple wounds []  - 0 Large Wound Dressing one or multiple wounds []  - 0 Application of Medications - topical []  - 0 Application of Medications - injection INTERVENTIONS - Miscellaneous []  - External ear exam 0 []  - 0 Specimen Collection (cultures, biopsies, blood, body fluids, etc.) []  - 0 Specimen(s) / Culture(s) sent or taken to Lab for analysis []  - 0 Patient Transfer (multiple staff / Civil Service fast streamer / Similar devices) []  - 0 Simple Staple / Suture removal (25 or less) []  - 0 Complex Staple / Suture removal (26 or more) []  - 0 Hypo / Hyperglycemic Management (close monitor of Blood Glucose) []  - 0 Ankle / Brachial Index (ABI) - do not check if billed separately X- 1 5 Vital Signs Has the patient been seen at the hospital within the last three years: Yes Total Score: 60 Level Of Care: New/Established - Level 2 Electronic Signature(s) Signed: 12/19/2021 2:41:05 PM By: Massie Kluver Entered By: Massie Kluver on 12/19/2021 14:02:04 Jonathan Mcmillan ([id number]) -------------------------------------------------------------------------------- Encounter Discharge Information Details Patient Name: Jonathan Mcmillan. Date of Service: 12/19/2021 1:15 PM Medical Record Number: [id number] Patient Account Number: [id number] Date of Birth/Sex: [birth year] (26 y.o. Male) Treating RN: Carlene Coria Primary Care Aniston Christman: Denton Lank Other Clinician: Massie Kluver Referring Monifa Blanchette: Denton Lank Treating Gianmarco Roye/Extender: Skipper Cliche in Treatment: 22 Encounter Discharge Information Items Discharge Condition: Stable Ambulatory Status:  Ambulatory Discharge Destination: Home Transportation: Private Auto Accompanied By: self Schedule Follow-up Appointment: Yes Clinical Summary of Care: Electronic Signature(s) Signed: 12/19/2021 2:41:05 PM By: Massie Kluver Entered By: Massie Kluver on 12/19/2021 14:03:03 Jonathan Mcmillan ([id number]) -------------------------------------------------------------------------------- Lower Extremity Assessment Details Patient Name: Jonathan Mcmillan. Date of Service: 12/19/2021 1:15 PM Medical Record Number: [id number] Patient Account Number: [id number] Date of Birth/Sex: 08-05-1995 (26 y.o. Male) Treating RN: Carlene Coria Primary Care Nyilah Kight: Denton Lank Other Clinician: Massie Kluver Referring Kiala Faraj: Denton Lank Treating Kasaundra Fahrney/Extender: Jeri Cos Weeks in Treatment: 22 Electronic Signature(s) Signed: 12/19/2021 2:41:05 PM By: Massie Kluver Signed: 12/21/2021 12:35:42 PM By: Carlene Coria RN Entered By: Massie Kluver on 12/19/2021 13:44:20 Jonathan Mcmillan ([id number]) -------------------------------------------------------------------------------- Multi Wound Chart Details Patient Name: Jonathan Mcmillan, Jonathan Mcmillan. Date of Service: 12/19/2021 1:15 PM Medical Record Number: [id number] Patient Account Number: [id number] Date of Birth/Sex: Oct 02, 1995 (26 y.o. Male) Treating RN: Carlene Coria Primary Care Clatie Kessen: Denton Lank Other Clinician: Massie Kluver Referring Katrina Daddona: Denton Lank Treating Kinisha Soper/Extender: Skipper Cliche in Treatment: 22 Vital Signs Height(in): 52 Pulse(bpm): 59 Weight(lbs): 146 Blood Pressure(mmHg): 168/86 Body Mass Index(BMI): 22.2 Temperature(F): 98.4 Respiratory Rate(breaths/min): 16 Photos: [N/A:N/A] Wound Location: Left, Lateral Foot N/A N/A Wounding Event: Gradually Appeared N/A N/A Primary Etiology: Pressure Ulcer N/A N/A Comorbid History: Hypertension, Type I Diabetes, End N/A N/A Stage Renal Disease, Neuropathy Date Acquired: 05/31/2021  N/A N/A Weeks of Treatment: 22 N/A N/A Wound Status: Open N/A N/A Wound Recurrence: No N/A N/A Measurements L x W x D (cm) 0.9x2.4x0.1 N/A N/A Area (cm) : 1.696 N/A N/A Volume (cm) : 0.17 N/A N/A % Reduction in Area: 78.40% N/A N/A % Reduction in Volume: 89.20% N/A N/A Classification: Category/Stage IV N/A N/A Exudate Amount: Medium N/A N/A Exudate Type: Serosanguineous N/A N/A Exudate Color: red, brown N/A N/A Granulation Amount: Small (1-33%) N/A N/A Granulation Quality: Pink, Hyper-granulation N/A N/A Necrotic Amount: Large (67-100%) N/A N/A Necrotic  Tissue: Eschar, Adherent Slough N/A N/A Exposed Structures: Fat Layer (Subcutaneous Tissue): N/A N/A Yes Muscle: Yes Bone: Yes Fascia: No Tendon: No Joint: No Epithelialization: Medium (34-66%) N/A N/A Treatment Notes Electronic Signature(s) Signed: 12/19/2021 2:41:05 PM By: Massie Kluver Entered By: Massie Kluver on 12/19/2021 13:44:31 Jonathan Mcmillan ([id number]) -------------------------------------------------------------------------------- [place] Details Patient Name: Jonathan Mcmillan. Date of Service: 12/19/2021 1:15 PM Medical Record Number: [id number] Patient Account Number: [id number] Date of Birth/Sex: [birth year] (26 y.o. Male) Treating RN: Carlene Coria Primary Care Jannette Cotham: Denton Lank Other Clinician: Massie Kluver Referring Mykiah Schmuck: Denton Lank Treating Matti Killingsworth/Extender: Skipper Cliche in Treatment: 22 Active Inactive Electronic Signature(s) Signed: 01/29/2022 2:32:45 PM By: Carlene Coria RN Signed: 02/05/2022 8:06:07 AM By: Gretta Cool BSN, RN, CWS, Kim RN, BSN Previous Signature: 12/19/2021 2:41:05 PM Version By: Massie Kluver Previous Signature: 12/21/2021 12:35:42 PM Version By: Carlene Coria RN Entered By: Gretta Cool, BSN, RN, CWS, Kim on 01/17/2022 15:40:14 Jonathan Mcmillan ([id number]) -------------------------------------------------------------------------------- Pain Assessment  Details Patient Name: Jonathan Mcmillan, Jonathan Mcmillan. Date of Service: 12/19/2021 1:15 PM Medical Record Number: [id number] Patient Account Number: [id number] Date of Birth/Sex: 1995/07/16 (26 y.o. Male) Treating RN: Carlene Coria Primary Care Monita Swier: Denton Lank Other Clinician: Massie Kluver Referring Shalandria Elsbernd: Denton Lank Treating Jasean Ambrosia/Extender: Skipper Cliche in Treatment: 22 Active Problems Location of Pain Severity and Description of Pain Patient Has Paino No Site Locations Pain Management and Medication Current Pain Management: Electronic Signature(s) Signed: 12/19/2021 2:41:05 PM By: Massie Kluver Signed: 12/21/2021 12:35:42 PM By: Carlene Coria RN Entered By: Massie Kluver on 12/19/2021 13:37:33 Jonathan Mcmillan ([id number]) -------------------------------------------------------------------------------- Patient/Caregiver Education Details Patient Name: Jonathan Mcmillan. Date of Service: 12/19/2021 1:15 PM Medical Record Number: [id number] Patient Account Number: [id number] Date of Birth/Gender: 04/21/1996 (26 y.o. Male) Treating RN: Carlene Coria Primary Care Physician: Denton Lank Other Clinician: Massie Kluver Referring Physician: Denton Lank Treating Physician/Extender: Skipper Cliche in Treatment: 22 Education Assessment Education Provided To: Patient Education Topics Provided Wound/Skin Impairment: Handouts: Other: wound healed. Please call if any further issues arise Electronic Signature(s) Signed: 12/19/2021 2:41:05 PM By: Massie Kluver Entered By: Massie Kluver on 12/19/2021 14:02:33 Jonathan Mcmillan ([id number]) -------------------------------------------------------------------------------- Wound Assessment Details Patient Name: Jonathan Mcmillan. Date of Service: 12/19/2021 1:15 PM Medical Record Number: [id number] Patient Account Number: [id number] Date of Birth/Sex: [birth year] (26 y.o. Male) Treating RN: Carlene Coria Primary Care Bryon Parker: Denton Lank Other Clinician: Massie Kluver Referring Kevona Lupinacci: Denton Lank Treating Velvie Thomaston/Extender: Jeri Cos Weeks in Treatment: 22 Wound Status Wound Number: 2 Primary Pressure Ulcer Etiology: Wound Location: Left, Lateral Foot Wound Status: Healed - Epithelialized Wounding Event: Gradually Appeared Comorbid Hypertension, Type I Diabetes, End Stage Renal Date Acquired: 05/31/2021 History: Disease, Neuropathy Weeks Of Treatment: 22 Clustered Wound: No Photos Wound Measurements Length: (cm) 0 % R Width: (cm) 0 % R Depth: (cm) 0 Epi Area: (cm) 0 Tu Volume: (cm) 0 Un eduction in Area: 100% eduction in Volume: 100% thelialization: Medium (34-66%) nneling: No dermining: No Wound Description Classification: Category/Stage IV Fou Exudate Amount: Medium Slo Exudate Type: Serosanguineous Exudate Color: red, brown l Odor After Cleansing: No ugh/Fibrino Yes Wound Bed Granulation Amount: Small (1-33%) Exposed Structure Granulation Quality: Pink, Hyper-granulation Fascia Exposed: No Necrotic Amount: Large (67-100%) Fat Layer (Subcutaneous Tissue) Exposed: Yes Necrotic Quality: Eschar Tendon Exposed: No Muscle Exposed: Yes Necrosis of Muscle: Joint Exposed: No Bone Exposed: Yes Treatment Notes Wound #2 (Foot) Wound Laterality: Left, Lateral Cleanser Peri-Wound Care Topical GARNETT, NUNZIATA ([id number]) Primary Dressing Secondary Dressing Secured With Compression  Wrap Compression Stockings Add-Ons Electronic Signature(s) Signed: 12/19/2021 2:41:05 PM By: Massie Kluver Signed: 12/21/2021 12:35:42 PM By: Carlene Coria RN Entered By: Massie Kluver on 12/19/2021 13:59:38 Jonathan Mcmillan ([id number]) -------------------------------------------------------------------------------- [place] Details Patient Name: Jonathan Mcmillan. Date of Service: 12/19/2021 1:15 PM Medical Record Number: [id number] Patient Account Number: [id number] Date of Birth/Sex: January 05, 1996 (26 y.o.  Male) Treating RN: Carlene Coria Primary Care Ryoma Nofziger: Denton Lank Other Clinician: Massie Kluver Referring Matti Minney: Denton Lank Treating Hai Grabe/Extender: Skipper Cliche in Treatment: 22 Vital Signs Time Taken: 13:35 Temperature (F): 98.4 Height (in): 68 Pulse (bpm): 94 Weight (lbs): 146 Respiratory Rate (breaths/min): 16 Body Mass Index (BMI): 22.2 Blood Pressure (mmHg): 168/86 Reference Range: 80 - 120 mg / dl Notes Patient has not taken blood pressure medication today Electronic Signature(s) Signed: 12/19/2021 2:41:05 PM By: Massie Kluver Entered By: Massie Kluver on 12/19/2021 13:37:29

## 2022-01-16 ENCOUNTER — Ambulatory Visit: Payer: Medicare Other | Admitting: Physician Assistant

## 2022-01-17 ENCOUNTER — Encounter: Payer: Medicare Other | Attending: Physician Assistant | Admitting: Internal Medicine

## 2022-01-17 DIAGNOSIS — Y835 Amputation of limb(s) as the cause of abnormal reaction of the patient, or of later complication, without mention of misadventure at the time of the procedure: Secondary | ICD-10-CM | POA: Diagnosis not present

## 2022-01-17 DIAGNOSIS — G931 Anoxic brain damage, not elsewhere classified: Secondary | ICD-10-CM | POA: Diagnosis not present

## 2022-01-17 DIAGNOSIS — S91301A Unspecified open wound, right foot, initial encounter: Secondary | ICD-10-CM

## 2022-01-17 DIAGNOSIS — M86171 Other acute osteomyelitis, right ankle and foot: Secondary | ICD-10-CM | POA: Diagnosis not present

## 2022-01-17 DIAGNOSIS — Z89431 Acquired absence of right foot: Secondary | ICD-10-CM | POA: Insufficient documentation

## 2022-01-17 DIAGNOSIS — T8781 Dehiscence of amputation stump: Secondary | ICD-10-CM | POA: Insufficient documentation

## 2022-01-17 DIAGNOSIS — E1169 Type 2 diabetes mellitus with other specified complication: Secondary | ICD-10-CM | POA: Insufficient documentation

## 2022-01-17 DIAGNOSIS — T8131XA Disruption of external operation (surgical) wound, not elsewhere classified, initial encounter: Secondary | ICD-10-CM | POA: Diagnosis not present

## 2022-01-17 DIAGNOSIS — I2510 Atherosclerotic heart disease of native coronary artery without angina pectoris: Secondary | ICD-10-CM | POA: Diagnosis not present

## 2022-01-17 NOTE — Progress Notes (Addendum)
ARAM, DOMZALSKI ([id number]) Visit Report for 01/17/2022 Chief Complaint Document Details Patient Name: Jonathan Mcmillan, Jonathan Mcmillan. Date of Service: 01/17/2022 10:00 AM Medical Record Number: [id number] Patient Account Number: [id number] Date of Birth/Sex: 05/13/1996 (26 y.o. Male) Treating RN: Cornell Barman Primary Care Provider: Denton Lank Other Clinician: Referring Provider: Denton Lank Treating Provider/Extender: Yaakov Guthrie in Treatment: 26 Information Obtained from: Patient Chief Complaint 01/17/2022; right foot wound Electronic Signature(s) Signed: 01/17/2022 12:34:47 PM By: Kalman Shan DO Entered By: Kalman Shan on 01/17/2022 11:03:40 Jonathan Mcmillan ([id number]) -------------------------------------------------------------------------------- HPI Details Patient Name: Jonathan Mcmillan. Date of Service: 01/17/2022 10:00 AM Medical Record Number: [id number] Patient Account Number: [id number] Date of Birth/Sex: [birth year] (26 y.o. Male) Treating RN: Cornell Barman Primary Care Provider: Denton Lank Other Clinician: Referring Provider: Denton Lank Treating Provider/Extender: Yaakov Guthrie in Treatment: 26 History of Present Illness HPI Description: 07/14/2021 patient presents today for initial inspection here in the clinic concerning an issue that he has been having with wounds over the left lateral foot, right knee, and the largest of these is the sacral region where there is significant necrotic tissue noted in the base of this wound. Subsequently the patient is referred to [place] from what appears to be primary care in order for further management of his wounds. He also sees physical medicine and rehabilitation. I did receive a lot of the notes from there as far as what is been going on with regard to this gentleman. Patient is a 27 year old male who had an anoxic encephalopathy, end-stage renal disease on hemodialysis, he has been disabled with generalized weakness and  subsequently developed a sacral decubitus ulcer which apparently was present before he went into the hospital according to what is not tells me but worsened during the time in the ICU simply because he was unable to be turned with all the equipment that he was hooked up to. Again it sounds like they did what they could to try to keep this under control but nonetheless due to the severity of his illness it sounds as if he is lucky to be alive to be perfectly honest. Subsequently the patient also does have diabetes mellitus, hypertension, nicotine dependence, he did go to the emergency department on 07/05/2021 due to a "abscess" in the gluteal region. The family member had seen this and wanted him to be checked out. During that time medical staff wanted to admit the patient but he refused. On 07/06/2021 the patient was subsequently discharged from the hospital [facility] and apparently they were trying to get in touch according to the notes with his aunt who is the legal guardian but were not able to. That was around 12:37 AM. Nonetheless in the interim since they have been basically at home using Santyl which was the recommendation from the hospital. This has been on all locations. Dry dressings have been applied following. Unfortunately this just does not seem to be doing quite enough especially for the sacral area which is a significant wound to be honest. We do not have any sign of an x-ray that was done of the sacral area nor that they know of anything in particular that was done at the hospital. With all that being said I think an x-ray would be a good idea based on what I am seeing currently this does appear to be quite significant from a wound care perspective. 3/14; patient presents for follow-up. He has no issues or complaints today. He has been using Dakin's wet-to-dry dressings  to the wound beds. He denies signs of infection. He reports Currently taking Augmentin. 08/08/2021 upon  evaluation today patient actually appears to be doing better in regard to his wounds in the sacral region as well as the knee both of which are showing signs of excellent improvement. In regard to his foot I am not certain exactly what his blood flow is at this point. That something but I am going to spend a little bit of time looking into the chart regarding. Fortunately I do not see any signs of infection but I do believe he is probably can need some sharp debridement here but I do not want to perform this until we can confirm good blood flow. I know we were able to do this first visit because he was in the bed in the room where we can get him in position. We also were not able to do at last visit as we did not end up actually seeing him due to some incontinence issues. Nonetheless I think that we need to plan for ABIs at the next visit. 08-17-2021 upon evaluation today patient appears to be doing well with regard to his sacral wound as well as the knee the knee is completely healed. In regard to the foot he does have need for sharp debridement today. I am going to do that he is noncompressible but nonetheless has good pulses and good capillary refill there is no evidence of compromise here although he does have a lot of necrotic tissue we need to help clean away here. 08-24-2021 upon evaluation today patient's sacral region is actually doing quite well. Unfortunately the left lateral foot area is not doing nearly as well there is a lot of necrotic tissue here including necrotic muscle which I would have to attempt to remove today. Overall I do feel like that the patient is showing some signs of improvement were still holding as far as his insurance is concerned and the MRI being scheduled apparently we do not have documentation of insurance that will cover for the MRI and therefore they are not scheduling it at this point. Nonetheless his aunt does need to figure this out as soon as possible as we  really do need to look into getting this MRI done ASAP with regard to the left foot. 08-31-2021 upon evaluation today patient appears to be doing better in regard to his foot ulcer though he still has a significant amount of necrotic tissue. Apparently during the conversation today it became apparent that he has not even taken the Augmentin I previously prescribed for him. Again his x-ray did show evidence of potential for osteomyelitis but we had ordered an MRI but at the same time he was unable to get this as apparently he does not have insurance that covers anything that we are actually investigating and doing at this point. It was going to be close to $700 and it appears that he and the family have decided not to do this. Nonetheless he is going to need antibiotic treatment and really for at least 6 weeks based on what we are seeing. It looks like Omnicef should be appropriate based on the culture results from last week he is also going require some debridement today 4/27; large wound on the left lateral foot. Undermining but about the same in terms of measurements as last week. According to our intake nurse condition of the wound bed looks better. Similarly the area on the sacrum seems to be filling and inferiorly  although there is still under mining at 12:00. Using Dakin's wet-to-dry 5/10; patient presents for follow-up. He has no issues or complaints today. He states he has some days left to finish his antibiotic course. He has been using Dakin's wet-to-dry dressings. He denies signs of infection. 10-05-2021 upon evaluation today patient appears to be doing well with regard to his wounds. He has been tolerating the dressing changes without complication. Fortunately I do not see any evidence of infection locally or systemically which is great news. No fevers, chills, nausea, vomiting, or diarrhea. 10-19-2021 upon evaluation today patient appears to be doing well with regard to his wounds. Both are  showing signs of improvement the one in the gluteal area is just a small slit almost completely closed. 1 in the lateral portion of his foot is much better and overall I am very pleased I think there is just a very tiny area this can require some sharp debridement the remainder looks to be doing awesome. 11-07-2021 upon evaluation today patient's wound actually on the gluteal/sacral region actually appears to be completely healed which is great DAION, GINSBERG. ([id number]) news. I do not see any evidence of active infection locally or systemically which is great news. With that being said the wound on the foot area did not have a dressing on and the patient tells me it has not had 1 since Saturday. He tells me his aunt broke her ankle and therefore has not been able to help him for that reason I Georgina Peer try to find something that he can do on his own and will not have to rely on her to change. I think Hydrofera Blue may be a good option in that regard. The patient voiced understanding and agreement with giving that a try. 12-19-2021 upon evaluation today patient appears to be doing well with regard to his foot ulcer. The good news is I think he is completely healed. I do not see any evidence of infection at the moment. 01/17/2022 Patient presents today due to increased pain to his right foot. 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 Posey Pronto. He was discharged with p.o. doxycycline and ciprofloxacin. He states he is currently taking this. He presents today because he is having increased pain to the right foot. He currently denies systemic signs of infection. He states he has follow-up with Dr. Posey Pronto later in the month. It is unclear if he is doing dressing changes to the wound bed. He has iodoform packing currently. Electronic Signature(s) Signed: 01/17/2022 12:34:47 PM By:  Kalman Shan DO Entered By: Kalman Shan on 01/17/2022 11:06:42 Jonathan Mcmillan ([id number]) -------------------------------------------------------------------------------- Physical Exam Details Patient Name: KONSTANTINE, GERVASI. Date of Service: 01/17/2022 10:00 AM Medical Record Number: [id number] Patient Account Number: [id number] Date of Birth/Sex: 1996-01-08 (26 y.o. Male) Treating RN: Cornell Barman Primary Care Provider: Denton Lank Other Clinician: Referring Provider: Denton Lank Treating Provider/Extender: Yaakov Guthrie in Treatment: 26 Constitutional . Cardiovascular . Psychiatric . Notes Right foot: To the lateral aspect there is an incision site with wound dehiscence and sutures in place. To the plantar aspect there Is an area of fluctuance. Electronic Signature(s) Signed: 01/17/2022 12:34:47 PM By: Kalman Shan DO Entered By: Kalman Shan on 01/17/2022 11:08:13 Jonathan Mcmillan ([id number]) -------------------------------------------------------------------------------- Physician Orders Details Patient Name: Jonathan Mcmillan. Date of Service: 01/17/2022 10:00 AM Medical Record Number: [id number]  Patient Account Number: [id number] Date of Birth/Sex: 1995/07/25 (26 y.o. Male) Treating RN: Cornell Barman Primary Care Provider: Denton Lank Other Clinician: Referring Provider: Denton Lank Treating Provider/Extender: Yaakov Guthrie in Treatment: 14 Verbal / Phone Orders: No Diagnosis Coding ICD-10 Coding Code Description Z61.096E Unspecified open wound, right foot, initial encounter T81.31XA Disruption of external operation (surgical) wound, not elsewhere classified, initial encounter M86.171 Other acute osteomyelitis, right ankle and foot I25.10 Atherosclerotic heart disease of native coronary artery without angina pectoris 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, not elsewhere classified Discharge From [place] Only - Patient to go directly  to [facility] ED for evaluation and treatment of surgical wound on right foot. Wound Treatment Wound #4 - Foot Wound Laterality: Right, Lateral Primary Dressing: Gauze Discharge Instructions: As directed: moistened with Dakins Solution Secondary Dressing: Gauze Discharge Instructions: As directed: dry Secured With: Kerlix Roll Sterile or Non-Sterile 6-ply 4.5x4 (yd/yd) Discharge Instructions: Apply Kerlix as directed Electronic Signature(s) Signed: 01/17/2022 4:21:31 PM By: Kalman Shan DO Signed: 01/18/2022 9:40:40 AM By: Gretta Cool, BSN, RN, CWS, Kim RN, BSN Previous Signature: 01/17/2022 12:34:47 PM Version By: Kalman Shan DO Entered By: Gretta Cool BSN, RN, CWS, Kim on 01/17/2022 15:50:19 Jonathan Mcmillan ([id number]) -------------------------------------------------------------------------------- Problem List Details Patient Name: JORAN, KALLAL. Date of Service: 01/17/2022 10:00 AM Medical Record Number: [id number] Patient Account Number: [id number] Date of Birth/Sex: 1995-06-13 (26 y.o. Male) Treating RN: Cornell Barman Primary Care Provider: Denton Lank Other Clinician: Referring Provider: Denton Lank Treating Provider/Extender: Yaakov Guthrie in Treatment: 26 Active Problems ICD-10 Encounter Code Description Active Date MDM Diagnosis S91.301A Unspecified open wound, right foot, initial encounter 01/17/2022 No Yes T81.31XA Disruption of external operation (surgical) wound, not elsewhere 01/17/2022 No Yes classified, initial encounter M86.171 Other acute osteomyelitis, right ankle and foot 01/17/2022 No Yes I25.10 Atherosclerotic heart disease of native coronary artery without angina 07/14/2021 No Yes pectoris 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, not elsewhere classified 07/14/2021 No Yes Inactive Problems Resolved Problems ICD-10 Code Description Active Date Resolved Date L89.154 Pressure ulcer of sacral region, stage 4 07/14/2021 07/14/2021 L89.894 Pressure ulcer of other site, stage 4  07/14/2021 07/14/2021 Electronic Signature(s) Signed: 01/17/2022 12:34:47 PM By: Kalman Shan DO Entered By: Kalman Shan on 01/17/2022 11:03:21 Jonathan Mcmillan ([id number]) -------------------------------------------------------------------------------- Progress Note Details Patient Name: Jonathan Mcmillan. Date of Service: 01/17/2022 10:00 AM Medical Record Number: [id number] Patient Account Number: [id number] Date of Birth/Sex: 08-15-1995 (26 y.o. Male) Treating RN: Cornell Barman Primary Care Provider: Denton Lank Other Clinician: Referring Provider: Denton Lank Treating Provider/Extender: Yaakov Guthrie in Treatment: 26 Subjective Chief Complaint Information obtained from Patient 01/17/2022; right foot wound History of Present Illness (HPI) 07/14/2021 patient presents today for initial inspection here in the clinic concerning an issue that he has been having with wounds over the left lateral foot, right knee, and the largest of these is the sacral region where there is significant necrotic tissue noted in the base of this wound. Subsequently the patient is referred to [place] from what appears to be primary care in order for further management of his wounds. He also sees physical medicine and rehabilitation. I did receive a lot of the notes from there as far as what is been going on with regard to this gentleman. Patient is a 26 year old male who had an anoxic encephalopathy, end-stage renal disease on hemodialysis, he has been disabled with generalized weakness and subsequently developed a sacral decubitus ulcer which apparently was present before he went into the hospital according to what is  not tells me but worsened during the time in the ICU simply because he was unable to be turned with all the equipment that he was hooked up to. Again it sounds like they did what they could to try to keep this under control but nonetheless due to the severity of his illness it sounds as if he  is lucky to be alive to be perfectly honest. Subsequently the patient also does have diabetes mellitus, hypertension, nicotine dependence, he did go to the emergency department on 07/05/2021 due to a "abscess" in the gluteal region. The family member had seen this and wanted him to be checked out. During that time medical staff wanted to admit the patient but he refused. On 07/06/2021 the patient was subsequently discharged from the hospital [place] and apparently they were trying to get in touch according to the notes with his aunt who is the legal guardian but were not able to. That was around 12:37 AM. Nonetheless in the interim since they have been basically at home using Santyl which was the recommendation from the hospital. This has been on all locations. Dry dressings have been applied following. Unfortunately this just does not seem to be doing quite enough especially for the sacral area which is a significant wound to be honest. We do not have any sign of an x-ray that was done of the sacral area nor that they know of anything in particular that was done at the hospital. With all that being said I think an x-ray would be a good idea based on what I am seeing currently this does appear to be quite significant from a wound care perspective. 3/14; patient presents for follow-up. He has no issues or complaints today. He has been using Dakin's wet-to-dry dressings to the wound beds. He denies signs of infection. He reports Currently taking Augmentin. 08/08/2021 upon evaluation today patient actually appears to be doing better in regard to his wounds in the sacral region as well as the knee both of which are showing signs of excellent improvement. In regard to his foot I am not certain exactly what his blood flow is at this point. That something but I am going to spend a little bit of time looking into the chart regarding. Fortunately I do not see any signs of infection but I do believe  he is probably can need some sharp debridement here but I do not want to perform this until we can confirm good blood flow. I know we were able to do this first visit because he was in the bed in the room where we can get him in position. We also were not able to do at last visit as we did not end up actually seeing him due to some incontinence issues. Nonetheless I think that we need to plan for ABIs at the next visit. 08-17-2021 upon evaluation today patient appears to be doing well with regard to his sacral wound as well as the knee the knee is completely healed. In regard to the foot he does have need for sharp debridement today. I am going to do that he is noncompressible but nonetheless has good pulses and good capillary refill there is no evidence of compromise here although he does have a lot of necrotic tissue we need to help clean away here. 08-24-2021 upon evaluation today patient's sacral region is actually doing quite well. Unfortunately the left lateral foot area is not doing nearly as well there is a lot of  necrotic tissue here including necrotic muscle which I would have to attempt to remove today. Overall I do feel like that the patient is showing some signs of improvement were still holding as far as his insurance is concerned and the MRI being scheduled apparently we do not have documentation of insurance that will cover for the MRI and therefore they are not scheduling it at this point. Nonetheless his aunt does need to figure this out as soon as possible as we really do need to look into getting this MRI done ASAP with regard to the left foot. 08-31-2021 upon evaluation today patient appears to be doing better in regard to his foot ulcer though he still has a significant amount of necrotic tissue. Apparently during the conversation today it became apparent that he has not even taken the Augmentin I previously prescribed for him. Again his x-ray did show evidence of potential for  osteomyelitis but we had ordered an MRI but at the same time he was unable to get this as apparently he does not have insurance that covers anything that we are actually investigating and doing at this point. It was going to be close to $700 and it appears that he and the family have decided not to do this. Nonetheless he is going to need antibiotic treatment and really for at least 6 weeks based on what we are seeing. It looks like Omnicef should be appropriate based on the culture results from last week he is also going require some debridement today 4/27; large wound on the left lateral foot. Undermining but about the same in terms of measurements as last week. According to our intake nurse condition of the wound bed looks better. Similarly the area on the sacrum seems to be filling and inferiorly although there is still under mining at 12:00. Using Dakin's wet-to-dry 5/10; patient presents for follow-up. He has no issues or complaints today. He states he has some days left to finish his antibiotic course. He has been using Dakin's wet-to-dry dressings. He denies signs of infection. 10-05-2021 upon evaluation today patient appears to be doing well with regard to his wounds. He has been tolerating the dressing changes without complication. Fortunately I do not see any evidence of infection locally or systemically which is great news. No fevers, chills, nausea, vomiting, or diarrhea. 10-19-2021 upon evaluation today patient appears to be doing well with regard to his wounds. Both are showing signs of improvement the one in [place]. ([id number]) the gluteal area is just a small slit almost completely closed. 1 in the lateral portion of his foot is much better and overall I am very pleased I think there is just a very tiny area this can require some sharp debridement the remainder looks to be doing awesome. 11-07-2021 upon evaluation today patient's wound actually on the gluteal/sacral region  actually appears to be completely healed which is great news. I do not see any evidence of active infection locally or systemically which is great news. With that being said the wound on the foot area did not have a dressing on and the patient tells me it has not had 1 since Saturday. He tells me his aunt broke her ankle and therefore has not been able to help him for that reason I Georgina Peer try to find something that he can do on his own and will not have to rely on her to change. I think Hydrofera Blue may be a good option in that regard. The patient  voiced understanding and agreement with giving that a try. 12-19-2021 upon evaluation today patient appears to be doing well with regard to his foot ulcer. The good news is I think he is completely healed. I do not see any evidence of infection at the moment. 01/17/2022 Patient presents today due to increased pain to his right foot. 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 Posey Pronto. He was discharged with p.o. doxycycline and ciprofloxacin. He states he is currently taking this. He presents today because he is having increased pain to the right foot. He currently denies systemic signs of infection. He states he has follow-up with Dr. Posey Pronto later in the month. It is unclear if he is doing dressing changes to the wound bed. He has iodoform packing currently. Objective Constitutional Vitals Time Taken: 10:15 AM, Height: 68 in, Weight: 146 lbs, BMI: 22.2, Temperature: 97.6 F, Pulse: 83 bpm, Respiratory Rate: 16 breaths/min, Blood Pressure: 158/93 mmHg. General Notes: Patient states he does not take his bp meds on diaysis days until he gets home. General Notes: Right foot: To the lateral aspect there is an incision site with wound dehiscence and sutures in place. To the plantar aspect there Is an area of  fluctuance. Assessment Active Problems ICD-10 Unspecified open wound, right foot, initial encounter Disruption of external operation (surgical) wound, not elsewhere classified, initial encounter Other acute osteomyelitis, right ankle and foot Atherosclerotic heart disease of native coronary artery without angina pectoris Anoxic brain damage, not elsewhere classified Patient was last seen in the clinic on 12/19/2021 and discharged due to a healed left foot wound. We have not been following for a right foot wound. This is new to the patient. He recently had a fourth ray amputation on 9/1 secondary to osteomyelitis By Dr. Posey Pronto. The surgical site has dehisced. He has a fluctuant area on the plantar aspect suspicious for an abscess. We tried contacting Dr. Serita Grit office for a close follow-up however we were unable to obtain an appointment. At this time I recommended he go to the ED as he will need further advanced care including imaging and potential further surgery. For now he can do Dakin's wet-to-dry dressings. He knows to call with any questions or concerns. Follow- up as needed. 51 minutes was spent on the encounter including face-to-face, EMR review and coordination of care Plan 1. Dakin's wet-to-dry dressings 2. Advised to go to the ED today for potential imaging, surgery and IV antibiotics 3. Follow-up as needed AVELARDO, REESMAN ([id number]) Electronic Signature(s) Signed: 01/17/2022 1:10:33 PM By: Kalman Shan DO Previous Signature: 01/17/2022 12:34:47 PM Version By: Kalman Shan DO Entered By: Kalman Shan on 01/17/2022 12:54:18 Jonathan Mcmillan ([id number]) -------------------------------------------------------------------------------- SuperBill Details Patient Name: Jonathan Mcmillan. Date of Service: 01/17/2022 Medical Record Number: [id number] Patient Account Number: [id number] Date of Birth/Sex: 1995-08-04 (26 y.o. 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, not elsewhere classified Facility Procedures CPT4 Code: 75102585 Description: 99213 - WOUND CARE VISIT-LEV 3 EST PT Modifier: Quantity:  1 Physician Procedures CPT4 Code: 4360677 Description: 03403 - WC PHYS LEVEL 4 - EST PT Modifier: Quantity: 1 CPT4 Code: Description: ICD-10 Diagnosis Description T24.818H Unspecified open wound, right foot, initial encounter T81.31XA Disruption of external operation (surgical) wound, not elsewhere classifi M86.171 Other acute osteomyelitis, right ankle and foot G93.1  Anoxic brain damage, not elsewhere classified Modifier: ed, initial encounter Quantity: Electronic Signature(s) Signed: 01/17/2022 4:21:31 PM By: Kalman Shan DO Signed: 01/18/2022 9:40:40 AM By: Gretta Cool, BSN, RN, CWS, Kim RN, BSN Previous Signature: 01/17/2022 12:34:47 PM Version By: Kalman Shan DO Entered By: Gretta Cool, BSN, RN, CWS, Kim on 01/17/2022 15:50:40

## 2022-01-18 NOTE — Progress Notes (Signed)
DUVAL, MACLEOD ([id number]) Visit Report for 01/17/2022 Arrival Information Details Patient Name: Jonathan Mcmillan, Jonathan Mcmillan. Date of Service: 01/17/2022 10:00 AM Medical Record Number: [id number] Patient Account Number: [id number] Date of Birth/Sex: Aug 14, 1995 (26 y.o. Male) Treating RN: Cornell Barman Primary Care Jackelyn Illingworth: Denton Lank Other Clinician: Referring Kayci Belleville: Denton Lank Treating Mikeria Valin/Extender: Yaakov Guthrie in Treatment: 26 Visit Information History Since Last Visit Added or deleted any medications: Yes Patient Arrived: Ambulatory Has Footwear/Offloading in Place as Prescribed: Yes Arrival Time: 10:13 Right: Surgical Shoe with Pressure Relief Accompanied By: self Insole Transfer Assistance: None Pain Present Now: Yes Patient Identification Verified: Yes Secondary Verification Process Completed: Yes Patient Requires Transmission-Based No Precautions: Patient Has Alerts: Yes Patient Alerts: Type 2 diabetic NON COMPRESSABLE Electronic Signature(s) Signed: 01/18/2022 9:40:40 AM By: Gretta Cool, BSN, RN, CWS, Kim RN, BSN Entered By: Gretta Cool, BSN, RN, CWS, Kim on 01/17/2022 10:15:35 [name] ([id number]) -------------------------------------------------------------------------------- Clinic Level of Care Assessment Details Patient Name: Jonathan, Mcmillan. Date of Service: 01/17/2022 10:00 AM Medical Record Number: [id number] Patient Account Number: [id number] Date of Birth/Sex: [birth year] (26 y.o. Male) Treating RN: Cornell Barman Primary Care Eather Chaires: Denton Lank Other Clinician: Referring Carrin Vannostrand: Denton Lank Treating Ermine Stebbins/Extender: Yaakov Guthrie in Treatment: 26 Clinic Level of Care Assessment Items TOOL 4 Quantity Score []  - Use when only an EandM is performed on FOLLOW-UP visit 0 ASSESSMENTS - Nursing Assessment / Reassessment X - Reassessment of Co-morbidities (includes updates in patient status) 1 10 X- 1 5 Reassessment of Adherence to Treatment  Plan ASSESSMENTS - Wound and Skin Assessment / Reassessment []  - Simple Wound Assessment / Reassessment - one wound 0 []  - 0 Complex Wound Assessment / Reassessment - multiple wounds []  - 0 Dermatologic / Skin Assessment (not related to wound area) ASSESSMENTS - Focused Assessment []  - Circumferential Edema Measurements - multi extremities 0 []  - 0 Nutritional Assessment / Counseling / Intervention []  - 0 Lower Extremity Assessment (monofilament, tuning fork, pulses) []  - 0 Peripheral Arterial Disease Assessment (using hand held doppler) ASSESSMENTS - Ostomy and/or Continence Assessment and Care []  - Incontinence Assessment and Management 0 []  - 0 Ostomy Care Assessment and Management (repouching, etc.) PROCESS - Coordination of Care X - Simple Patient / Family Education for ongoing care 1 15 []  - 0 Complex (extensive) Patient / Family Education for ongoing care X- 1 10 Staff obtains Programmer, systems, Records, Test Results / Process Orders []  - 0 Staff telephones HHA, Nursing Homes / Clarify orders / etc []  - 0 Routine Transfer to another Facility (non-emergent condition) X- 1 10 Routine Hospital Admission (non-emergent condition) []  - 0 New Admissions / Biomedical engineer / Ordering NPWT, Apligraf, etc. []  - 0 Emergency Hospital Admission (emergent condition) X- 1 10 Simple Discharge Coordination []  - 0 Complex (extensive) Discharge Coordination PROCESS - Special Needs []  - Pediatric / Minor Patient Management 0 []  - 0 Isolation Patient Management []  - 0 Hearing / Language / Visual special needs []  - 0 Assessment of Community assistance (transportation, D/C planning, etc.) []  - 0 Additional assistance / Altered mentation []  - 0 Support Surface(s) Assessment (bed, cushion, seat, etc.) INTERVENTIONS - Wound Cleansing / Measurement VILAS, EDGERLY ([id number]) []  - 0 Simple Wound Cleansing - one wound []  - 0 Complex Wound Cleansing - multiple wounds []  - 0 Wound  Imaging (photographs - any number of wounds) []  - 0 Wound Tracing (instead of photographs) []  - 0 Simple Wound Measurement - one wound []  - 0 Complex Wound Measurement - multiple  wounds INTERVENTIONS - Wound Dressings []  - Small Wound Dressing one or multiple wounds 0 X- 1 15 Medium Wound Dressing one or multiple wounds []  - 0 Large Wound Dressing one or multiple wounds []  - 0 Application of Medications - topical []  - 0 Application of Medications - injection INTERVENTIONS - Miscellaneous []  - External ear exam 0 []  - 0 Specimen Collection (cultures, biopsies, blood, body fluids, etc.) []  - 0 Specimen(s) / Culture(s) sent or taken to Lab for analysis []  - 0 Patient Transfer (multiple staff / Civil Service fast streamer / Similar devices) []  - 0 Simple Staple / Suture removal (25 or less) []  - 0 Complex Staple / Suture removal (26 or more) []  - 0 Hypo / Hyperglycemic Management (close monitor of Blood Glucose) []  - 0 Ankle / Brachial Index (ABI) - do not check if billed separately X- 1 5 Vital Signs Has the patient been seen at the hospital within the last three years: Yes Total Score: 80 Level Of Care: New/Established - Level 3 Electronic Signature(s) Signed: 01/18/2022 9:40:40 AM By: Gretta Cool, BSN, RN, CWS, Kim RN, BSN Entered By: Gretta Cool, BSN, RN, CWS, Kim on 01/17/2022 15:45:48 [name] ([id number]) -------------------------------------------------------------------------------- Encounter Discharge Information Details Patient Name: [name]. Date of Service: 01/17/2022 10:00 AM Medical Record Number: [id number] Patient Account Number: [id number] Date of Birth/Sex: 1995/10/03 (26 y.o. Male) Treating RN: Cornell Barman Primary Care Dillie Burandt: Denton Lank Other Clinician: Referring Aayden Cefalu: Denton Lank Treating Raksha Wolfgang/Extender: Yaakov Guthrie in Treatment: 26 Encounter Discharge Information Items Discharge Condition: Stable Ambulatory Status: Ambulatory Discharge  Destination: Hospital Telephoned: Yes Spoke With: see below. Transportation: Private Auto Accompanied By: Jonathan Mcmillan Schedule Follow-up Appointment: No Clinical Summary of Care: Notes Left message on nurse line at surgeon's office for call back. Called back and spoke to reception, she stated that she was not in the same location as the nurses so that I would have to leave a message for call back. Electronic Signature(s) Signed: 01/18/2022 9:40:40 AM By: Gretta Cool, BSN, RN, CWS, Kim RN, BSN Entered By: Gretta Cool, BSN, RN, CWS, Kim on 01/17/2022 15:54:55 [name] ([id number]) -------------------------------------------------------------------------------- Lower Extremity Assessment Details Patient Name: KEBIN, MAYE. Date of Service: 01/17/2022 10:00 AM Medical Record Number: [id number] Patient Account Number: [id number] Date of Birth/Sex: Apr 03, 1996 (26 y.o. Male) Treating RN: Cornell Barman Primary Care Witney Huie: Denton Lank Other Clinician: Referring Lesly Pontarelli: Denton Lank Treating Raghad Lorenz/Extender: Yaakov Guthrie in Treatment: 26 Vascular Assessment Pulses: Dorsalis Pedis Palpable: [Right:No Yes] Electronic Signature(s) Signed: 01/18/2022 9:40:40 AM By: Gretta Cool, BSN, RN, CWS, Kim RN, BSN Entered By: Gretta Cool, BSN, RN, CWS, Kim on 01/17/2022 10:53:09 [name] ([id number]) -------------------------------------------------------------------------------- Multi Wound Chart Details Patient Name: [name]. Date of Service: 01/17/2022 10:00 AM Medical Record Number: [id number] Patient Account Number: [id number] Date of Birth/Sex: 02-Feb-1996 (26 y.o. Male) Treating RN: Cornell Barman Primary Care Naidelyn Parrella: Denton Lank Other Clinician: Referring Talulah Schirmer: Denton Lank Treating Theoren Palka/Extender: Yaakov Guthrie in Treatment: 26 Vital Signs Height(in): 58 Pulse(bpm): 28 Weight(lbs): 146 Blood Pressure(mmHg): 158/93 Body Mass Index(BMI): 22.2 Temperature(F):  97.6 Respiratory Rate(breaths/min): 16 Photos: [4:No Photos] [N/A:N/A] Wound Location: [4:Right, Lateral Foot] [N/A:N/A] Wounding Event: [4:Surgical Injury] [N/A:N/A] Primary Etiology: [4:Dehisced Wound] [N/A:N/A] Secondary Etiology: [4:Abscess] [N/A:N/A] Comorbid History: [4:Hypertension, Type I Diabetes, End Stage Renal Disease, Neuropathy] [N/A:N/A] Date Acquired: [4:01/12/2022] [N/A:N/A] Weeks of Treatment: [4:0] [N/A:N/A] Wound Status: [4:Open] [N/A:N/A] Wound Recurrence: [4:No] [N/A:N/A] Measurements L x W x D (cm) [4:6x6.5x1.2] [N/A:N/A] Area (cm) : [4:30.631] [N/A:N/A] Volume (cm) : [4:36.757] [N/A:N/A] %  Reduction in Area: [4:0.00%] [N/A:N/A] % Reduction in Volume: [4:0.00%] [N/A:N/A] Classification: [4:Full Thickness With Exposed Support Structures] [N/A:N/A] Exudate Amount: [4:Large] [N/A:N/A] Exudate Type: [4:Serous] [N/A:N/A] Exudate Color: [4:amber] [N/A:N/A] Assessment Notes: [4:Patient arrived today with dehisced wound and abscess on right foot from surgery completed at [facility] on 01/12/2021. Patient instructed to go to [facility] ED for evaluation and treatment of wound.] [N/A:N/A] Treatment Notes Electronic Signature(s) Signed: 01/18/2022 9:40:40 AM By: Gretta Cool, BSN, RN, CWS, Kim RN, BSN Entered By: Gretta Cool, BSN, RN, CWS, Kim on 01/17/2022 15:43:07 [name] ([id number]) -------------------------------------------------------------------------------- [place] Details Patient Name: FARAZ, PONCIANO. Date of Service: 01/17/2022 10:00 AM Medical Record Number: [id number] Patient Account Number: [id number] Date of Birth/Sex: 1995/11/14 (26 y.o. Male) Treating RN: Cornell Barman Primary Care Johnston Maddocks: Denton Lank Other Clinician: Referring Paschal Blanton: Denton Lank Treating Okley Magnussen/Extender: Yaakov Guthrie in Treatment: 75 Active Inactive Electronic Signature(s) Signed: 01/18/2022 9:40:40 AM By: Gretta Cool, BSN, RN, CWS, Kim RN, BSN Entered By: Gretta Cool, BSN, RN,  CWS, Kim on 01/17/2022 15:42:54 [name] ([id number]) -------------------------------------------------------------------------------- Pain Assessment Details Patient Name: MAXMILIAN, TROSTEL. Date of Service: 01/17/2022 10:00 AM Medical Record Number: [id number] Patient Account Number: [id number] Date of Birth/Sex: 12/20/1995 (26 y.o. Male) Treating RN: Cornell Barman Primary Care Edmund Holcomb: Denton Lank Other Clinician: Referring Antione Obar: Denton Lank Treating Teyonna Plaisted/Extender: Yaakov Guthrie in Treatment: 26 Active Problems Location of Pain Severity and Description of Pain Patient Has Paino Yes Site Locations Pain Location: Pain in Ulcers Rate the pain. Current Pain Level: 8 Character of Pain Describe the Pain: Sharp Pain Management and Medication Current Pain Management: Electronic Signature(s) Signed: 01/18/2022 9:40:40 AM By: Gretta Cool, BSN, RN, CWS, Kim RN, BSN Entered By: Gretta Cool, BSN, RN, CWS, Kim on 01/17/2022 10:17:12 [name] ([id number]) -------------------------------------------------------------------------------- Patient/Caregiver Education Details Patient Name: [name]. Date of Service: 01/17/2022 10:00 AM Medical Record Number: [id number] Patient Account Number: [id number] Date of Birth/Gender: Jun 28, 1995 (26 y.o. Male) Treating RN: Cornell Barman Primary Care Physician: Denton Lank Other Clinician: Referring Physician: Denton Lank Treating Physician/Extender: Yaakov Guthrie in Treatment: 26 Education Assessment Education Provided To: Patient Education Topics Provided Notes Patient arrived today with dehisced wound and abscess on right foot from surgery completed at [facility] [place] on 01/12/2021. Patient instructed to go to [facility] ED for evaluation and treatment of wound. Electronic Signature(s) Signed: 01/18/2022 9:40:40 AM By: Gretta Cool, BSN, RN, CWS, Kim RN, BSN Entered By: Gretta Cool, BSN, RN, CWS, Kim on 01/17/2022 15:46:14 [name]  ([id number]) -------------------------------------------------------------------------------- Wound Assessment Details Patient Name: RAYNALDO, FALCO. Date of Service: 01/17/2022 10:00 AM Medical Record Number: [id number] Patient Account Number: [id number] Date of Birth/Sex: 06/06/1995 (26 y.o. Male) Treating RN: Cornell Barman Primary Care Keyundra Fant: Denton Lank Other Clinician: Referring Jachelle Fluty: Denton Lank Treating Kevion Fatheree/Extender: Yaakov Guthrie in Treatment: 26 Wound Status Wound Number: 4 Primary Dehisced Wound Etiology: Wound Location: Right, Lateral Foot Secondary Abscess Wounding Event: Surgical Injury Etiology: Date Acquired: 01/12/2022 Wound Open Weeks Of Treatment: 0 Status: Clustered Wound: No Notes: Patient arrived today with dehisced wound and abscess on right foot from surgery completed at [facility] on 01/12/2021. Patient instructed to go to [facility] ED for evaluation and treatment of wound. Comorbid Hypertension, Type I Diabetes, End Stage Renal Disease, History: Neuropathy Wound Measurements Length: (cm) 6 Width: (cm) 6.5 Depth: (cm) 1.2 Area: (cm) 30.631 Volume: (cm) 36.757 % Reduction in Area: 0% % Reduction in Volume: 0% Wound Description Classification: Full Thickness With Exposed Support Structures Exudate Amount: Large Exudate Type: Serous Exudate Color:  amber Foul Odor After Cleansing: No Slough/Fibrino Yes Assessment Notes Patient arrived today with dehisced wound and abscess on right foot from surgery completed at [facility] on 01/12/2021. Patient instructed to go to [facility] ED for evaluation and treatment of wound. Treatment Notes Wound #4 (Foot) Wound Laterality: Right, Lateral Cleanser Peri-Wound Care Topical Primary Dressing Gauze Discharge Instruction: As directed: moistened with Dakins Solution Secondary Dressing Gauze Discharge Instruction: As directed: dry Secured With Kerlix Roll Sterile or Non-Sterile 6-ply 4.5x4 (yd/yd) Discharge  Instruction: Apply Kerlix as directed Compression Wrap Compression Stockings Add-Ons KEAGON, GLASCOE ([id number]) Electronic Signature(s) Signed: 01/18/2022 9:40:40 AM By: Gretta Cool, BSN, RN, CWS, Kim RN, BSN Entered By: Gretta Cool, BSN, RN, CWS, Kim on 01/17/2022 15:42:24 [name] ([id number]) -------------------------------------------------------------------------------- Vitals Details Patient Name: [name]. Date of Service: 01/17/2022 10:00 AM Medical Record Number: [id number] Patient Account Number: [id number] Date of Birth/Sex: 04-13-1996 (26 y.o. Male) Treating RN: Cornell Barman Primary Care Jonnie Kubly: Denton Lank Other Clinician: Referring Amalea Ottey: Denton Lank Treating Tarra Pence/Extender: Yaakov Guthrie in Treatment: 26 Vital Signs Time Taken: 10:15 Temperature (F): 97.6 Height (in): 68 Pulse (bpm): 83 Weight (lbs): 146 Respiratory Rate (breaths/min): 16 Body Mass Index (BMI): 22.2 Blood Pressure (mmHg): 158/93 Reference Range: 80 - 120 mg / dl Notes Patient states he does not take his bp meds on diaysis days until he gets home. Electronic Signature(s) Signed: 01/18/2022 9:40:40 AM By: Gretta Cool, BSN, RN, CWS, Kim RN, BSN Entered By: Gretta Cool, BSN, RN, CWS, Kim on 01/17/2022 10:16:51

## 2022-04-11 ENCOUNTER — Encounter: Payer: Self-pay | Admitting: Emergency Medicine

## 2022-04-11 ENCOUNTER — Emergency Department: Payer: Medicare HMO

## 2022-04-11 ENCOUNTER — Emergency Department
Admission: EM | Admit: 2022-04-11 | Discharge: 2022-04-12 | Disposition: A | Discharge status: Home / Self Care | Payer: Medicare HMO | Attending: Emergency Medicine | Admitting: Emergency Medicine

## 2022-04-11 DIAGNOSIS — I132 Hypertensive heart and chronic kidney disease with heart failure and with stage 5 chronic kidney disease, or end stage renal disease: Secondary | ICD-10-CM | POA: Diagnosis not present

## 2022-04-11 DIAGNOSIS — R1115 Cyclical vomiting syndrome unrelated to migraine: Secondary | ICD-10-CM | POA: Diagnosis not present

## 2022-04-11 DIAGNOSIS — N186 End stage renal disease: Secondary | ICD-10-CM | POA: Diagnosis not present

## 2022-04-11 DIAGNOSIS — Z992 Dependence on renal dialysis: Secondary | ICD-10-CM | POA: Insufficient documentation

## 2022-04-11 DIAGNOSIS — E1122 Type 2 diabetes mellitus with diabetic chronic kidney disease: Secondary | ICD-10-CM | POA: Diagnosis not present

## 2022-04-11 DIAGNOSIS — R1084 Generalized abdominal pain: Secondary | ICD-10-CM | POA: Diagnosis present

## 2022-04-11 DIAGNOSIS — E876 Hypokalemia: Secondary | ICD-10-CM | POA: Diagnosis not present

## 2022-04-11 DIAGNOSIS — I509 Heart failure, unspecified: Secondary | ICD-10-CM | POA: Insufficient documentation

## 2022-04-11 LAB — COMPREHENSIVE METABOLIC PANEL
ALT: 24 U/L (ref 0–44)
AST: 21 U/L (ref 15–41)
Albumin: 4.3 g/dL (ref 3.5–5.0)
Alkaline Phosphatase: 131 U/L — ABNORMAL HIGH (ref 38–126)
Anion gap: 12 (ref 5–15)
BUN: 27 mg/dL — ABNORMAL HIGH (ref 6–20)
CO2: 31 mmol/L (ref 22–32)
Calcium: 9.3 mg/dL (ref 8.9–10.3)
Chloride: 101 mmol/L (ref 98–111)
Creatinine, Ser: 4.59 mg/dL — ABNORMAL HIGH (ref 0.61–1.24)
GFR, Estimated: 17 mL/min — ABNORMAL LOW (ref >60)
Glucose, Bld: 101 mg/dL — ABNORMAL HIGH (ref 70–99)
Potassium: 2.7 mmol/L — CL (ref 3.5–5.1)
Sodium: 144 mmol/L (ref 135–145)
Total Bilirubin: 1.6 mg/dL — ABNORMAL HIGH (ref 0.3–1.2)
Total Protein: 8.6 g/dL — ABNORMAL HIGH (ref 6.5–8.1)

## 2022-04-11 LAB — CBC
HCT: 35.7 % — ABNORMAL LOW (ref 39.0–52.0)
Hemoglobin: 11.8 g/dL — ABNORMAL LOW (ref 13.0–17.0)
MCH: 29.2 pg (ref 26.0–34.0)
MCHC: 33.1 g/dL (ref 30.0–36.0)
MCV: 88.4 fL (ref 80.0–100.0)
Platelets: 176 10*3/uL (ref 150–400)
RBC: 4.04 MIL/uL — ABNORMAL LOW (ref 4.22–5.81)
RDW: 14 % (ref 11.5–15.5)
WBC: 4.4 10*3/uL (ref 4.0–10.5)
nRBC: 0 % (ref 0.0–0.2)

## 2022-04-11 LAB — LIPASE, BLOOD: Lipase: 35 U/L (ref 11–51)

## 2022-04-11 LAB — CBG MONITORING, ED: Glucose-Capillary: 98 mg/dL (ref 70–99)

## 2022-04-11 MED ORDER — HYDROMORPHONE HCL 1 MG/ML IJ SOLN
1.0000 mg | Freq: Once | INTRAMUSCULAR | Status: AC
  Administered 2022-04-11: 1 mg via INTRAVENOUS
  Filled 2022-04-11: qty 1

## 2022-04-11 MED ORDER — POTASSIUM CHLORIDE CRYS ER 20 MEQ PO TBCR
20.0000 meq | EXTENDED_RELEASE_TABLET | Freq: Once | ORAL | Status: AC
  Administered 2022-04-11: 20 meq via ORAL
  Filled 2022-04-11: qty 1

## 2022-04-11 MED ORDER — ONDANSETRON HCL 4 MG/2ML IJ SOLN
4.0000 mg | Freq: Once | INTRAMUSCULAR | Status: AC
  Administered 2022-04-11: 4 mg via INTRAVENOUS
  Filled 2022-04-11: qty 2

## 2022-04-11 MED ORDER — CARVEDILOL 6.25 MG PO TABS
3.1250 mg | ORAL_TABLET | Freq: Once | ORAL | Status: AC
  Administered 2022-04-11: 3.125 mg via ORAL
  Filled 2022-04-11: qty 1

## 2022-04-11 MED ORDER — SODIUM CHLORIDE 0.9 % IV BOLUS
250.0000 mL | Freq: Once | INTRAVENOUS | Status: AC
  Administered 2022-04-11: 250 mL via INTRAVENOUS

## 2022-04-11 NOTE — ED Notes (Signed)
Patient transported to CT 

## 2022-04-11 NOTE — ED Provider Notes (Signed)
[facility] Provider Note    Event Date/Time   First MD Initiated Contact with Patient 04/11/22 2232     (approximate)  History   Chief Complaint: Abdominal Pain  HPI  Jonathan Mcmillan is a 26 y.o. male with a past medical history of anemia, cannabinoid hyperemesis, CHF, diabetes, hypertension, ESRD on HD presents to the emergency department for abdominal pain nausea and vomiting.  Patient states for the past 2 days he has been experiencing diffuse abdominal pain with nausea and vomiting.  Patient is a dialysis patient last received dialysis earlier today.  Patient rates the pain as a 10/10 diffusely across the abdomen but more so in the upper abdomen.  Physical Exam   Triage Vital Signs: ED Triage Vitals  Enc Vitals Group     BP 04/11/22 2031 (!) 183/112     Pulse Rate 04/11/22 2031 98     Resp 04/11/22 2031 20     Temp 04/11/22 2031 97.8 F (36.6 C)     Temp Source 04/11/22 2031 Oral     SpO2 04/11/22 2031 98 %     Weight 04/11/22 2031 128 lb (58.1 kg)     Height 04/11/22 2031 5\' 9"  (1.753 m)     Head Circumference --      Peak Flow --      Pain Score 04/11/22 2033 10     Pain Loc --      Pain Edu? --      Excl. in [place]? --     Most recent vital signs: Vitals:   04/11/22 2031  BP: (!) 183/112  Pulse: 98  Resp: 20  Temp: 97.8 F (36.6 C)  SpO2: 98%    General: Awake, no distress.  CV:  Good peripheral perfusion.  Regular rate and rhythm  Resp:  Normal effort.  Equal breath sounds bilaterally.  Abd:  No distention, soft but moderate diffuse tenderness without rebound or guarding.   ED Results / Procedures / Treatments   RADIOLOGY  ***   MEDICATIONS ORDERED IN ED: Medications  HYDROmorphone (DILAUDID) injection 1 mg (has no administration in time range)  ondansetron (ZOFRAN) injection 4 mg (has no administration in time range)  sodium chloride 0.9 % bolus 250 mL (has no administration in time range)     IMPRESSION / MDM /  ASSESSMENT AND PLAN / ED COURSE  I reviewed the triage vital signs and the nursing notes.  Patient's presentation is most consistent with acute presentation with potential threat to life or bodily function.  Patient presents emergency department for diffuse abdominal pain nausea vomiting.  Patient is actively vomiting in the emergency department.  Has diffuse moderate tenderness throughout the abdomen but no area of focal tenderness identified.  No fever.  Patient is hypertensive but again actively vomiting.  Patient just had dialysis earlier today.  Patient's lab work is overall reassuring with a normal CBC, negative lipase.  Patient's chemistry shows no significant change from his baseline renal function, potassium of 2.7, again patient just dialyzed earlier today.  We will treat the patient's abdominal pain, we will treat his nausea and IV hydrate the patient.  Patient does have a documented history of cannabinoid hyperemesis which could possibly be related to today's presentation.  However given his diffuse pain we will obtain CT imaging to rule out other intra-abdominal pathology such as intra-abdominal infection, cholecystitis, appendicitis, etc.  FINAL CLINICAL IMPRESSION(S) / ED DIAGNOSES   Abdominal pain Nausea vomiting  Note:  This document was prepared using Dragon voice recognition software and may include unintentional dictation errors.

## 2022-04-11 NOTE — ED Provider Notes (Signed)
-----------------------------------------   11:24 PM on 04/11/2022 -----------------------------------------  Assuming care from Dr. Kerman Passey.  In short, Jonathan Mcmillan is a 26 y.o. male with a chief complaint of abdominal pain, nausea, and vomiting, consistent with cyclic vomiting syndrome.[name]  Refer to the original H&P for additional details.  The current plan of care is to reassess after medication and fluids.     Clinical Course as of 04/12/22 0651  Thu Apr 12, 2022  0222 Patient has been sleeping comfortably and steadily for the last several hours.  I viewed and interpreted his CT scan of the abdomen and pelvis.  There is some nonspecific changes but nothing acute.  The radiologist mentioned that there is what seems to be improving osteomyelitis that was previous identified months ago.  There is no evidence of an active issue at this time.  Patient's vital signs are remained notable for hypertension which is chronic but he was able to tolerate oral intake including potassium supplement and his oral Coreg.  I woke him up and gave him an update about all of his results which are reassuring and showed no evidence of an acute or emergent issue.  I looked back through his prior visits including emergency department visit at [facility] from 2 months ago which was very similar in prior visits here where he signed out [place] and refused to stay in the hospital (reportedly he stayed overnight at [facility] but then insisted on being discharged by noon when he had similar cyclic vomiting issues).  When I updated him tonight about his symptoms and pointed out that his nausea and vomiting seem to have resolved, he agreed to go home and said that he could call someone for a ride.  He then asked for more pain medicine.  However I explained that no more pain medicine was needed at this time given that he has been sleeping for hours and that he can take any medicine he may have from his prior prescriptions but we would not be  providing additional medications.  He did not object.  At this point, though initially I considered hospitalization given his poor health status at baseline and his numerous chronic medical conditions, particularly for a 26 year old, there is no evidence he has an emergent issue tonight that requires hospitalization or additional treatment that is unavailable to him as an outpatient. [CF]  [phone] Of note, I checked the [place] controlled substance database.  The patient has had 4 different prescriptions for narcotics in the last 2 months, and 8 within the last year and a half.  These tend to be from different providers. [CF]    Clinical Course User Index [CF] Hinda Kehr, MD     Medications  HYDROmorphone (DILAUDID) injection 1 mg (1 mg Intravenous Given 04/11/22 2249)  ondansetron (ZOFRAN) injection 4 mg (4 mg Intravenous Given 04/11/22 2250)  sodium chloride 0.9 % bolus 250 mL (0 mLs Intravenous Stopped 04/12/22 0055)  potassium chloride SA (KLOR-CON M) CR tablet 20 mEq (20 mEq Oral Given 04/11/22 2332)  carvedilol (COREG) tablet 3.125 mg (3.125 mg Oral Given 04/11/22 2332)     ED Discharge Orders     None      Final diagnoses:  Cyclic vomiting syndrome  Hypokalemia  Chronic kidney disease with end stage renal failure on dialysis [facility])     Hinda Kehr, MD 04/12/22 [phone]

## 2022-04-11 NOTE — ED Triage Notes (Signed)
Pt presents via POV with complaints of generalized lower abdominal pain for the last 2 days with associated N/V. Unable to describe the emesis but rates the pain 10/10. No abdominal surgeries. Denies CP or SOB.    CBG-98

## 2022-04-12 NOTE — ED Notes (Signed)
Pt tolerating fluid intake at this time.

## 2022-04-29 ENCOUNTER — Emergency Department: Payer: Medicare HMO

## 2022-04-29 ENCOUNTER — Other Ambulatory Visit: Payer: Self-pay

## 2022-04-29 DIAGNOSIS — K529 Noninfective gastroenteritis and colitis, unspecified: Secondary | ICD-10-CM | POA: Diagnosis not present

## 2022-04-29 DIAGNOSIS — E440 Moderate protein-calorie malnutrition: Secondary | ICD-10-CM | POA: Insufficient documentation

## 2022-04-29 DIAGNOSIS — R4182 Altered mental status, unspecified: Secondary | ICD-10-CM | POA: Diagnosis not present

## 2022-04-29 DIAGNOSIS — Z89421 Acquired absence of other right toe(s): Secondary | ICD-10-CM | POA: Insufficient documentation

## 2022-04-29 DIAGNOSIS — G9341 Metabolic encephalopathy: Secondary | ICD-10-CM | POA: Insufficient documentation

## 2022-04-29 DIAGNOSIS — R1010 Upper abdominal pain, unspecified: Secondary | ICD-10-CM | POA: Diagnosis not present

## 2022-04-29 DIAGNOSIS — Z8616 Personal history of COVID-19: Secondary | ICD-10-CM | POA: Insufficient documentation

## 2022-04-29 DIAGNOSIS — I5042 Chronic combined systolic (congestive) and diastolic (congestive) heart failure: Secondary | ICD-10-CM | POA: Insufficient documentation

## 2022-04-29 DIAGNOSIS — E1043 Type 1 diabetes mellitus with diabetic autonomic (poly)neuropathy: Secondary | ICD-10-CM | POA: Diagnosis not present

## 2022-04-29 DIAGNOSIS — F17210 Nicotine dependence, cigarettes, uncomplicated: Secondary | ICD-10-CM | POA: Insufficient documentation

## 2022-04-29 DIAGNOSIS — I160 Hypertensive urgency: Secondary | ICD-10-CM | POA: Insufficient documentation

## 2022-04-29 DIAGNOSIS — Z992 Dependence on renal dialysis: Secondary | ICD-10-CM | POA: Insufficient documentation

## 2022-04-29 DIAGNOSIS — E1022 Type 1 diabetes mellitus with diabetic chronic kidney disease: Secondary | ICD-10-CM | POA: Insufficient documentation

## 2022-04-29 DIAGNOSIS — E876 Hypokalemia: Secondary | ICD-10-CM | POA: Insufficient documentation

## 2022-04-29 DIAGNOSIS — D631 Anemia in chronic kidney disease: Secondary | ICD-10-CM | POA: Insufficient documentation

## 2022-04-29 DIAGNOSIS — F12288 Cannabis dependence with other cannabis-induced disorder: Secondary | ICD-10-CM | POA: Insufficient documentation

## 2022-04-29 DIAGNOSIS — Z79899 Other long term (current) drug therapy: Secondary | ICD-10-CM | POA: Diagnosis not present

## 2022-04-29 DIAGNOSIS — R9431 Abnormal electrocardiogram [ECG] [EKG]: Secondary | ICD-10-CM | POA: Insufficient documentation

## 2022-04-29 DIAGNOSIS — I480 Paroxysmal atrial fibrillation: Secondary | ICD-10-CM | POA: Diagnosis not present

## 2022-04-29 DIAGNOSIS — I132 Hypertensive heart and chronic kidney disease with heart failure and with stage 5 chronic kidney disease, or end stage renal disease: Secondary | ICD-10-CM | POA: Diagnosis not present

## 2022-04-29 DIAGNOSIS — N186 End stage renal disease: Secondary | ICD-10-CM | POA: Insufficient documentation

## 2022-04-29 DIAGNOSIS — Z794 Long term (current) use of insulin: Secondary | ICD-10-CM | POA: Diagnosis not present

## 2022-04-29 DIAGNOSIS — R112 Nausea with vomiting, unspecified: Principal | ICD-10-CM | POA: Insufficient documentation

## 2022-04-29 LAB — CBC
HCT: 32.5 % — ABNORMAL LOW (ref 39.0–52.0)
Hemoglobin: 10.7 g/dL — ABNORMAL LOW (ref 13.0–17.0)
MCH: 29.7 pg (ref 26.0–34.0)
MCHC: 32.9 g/dL (ref 30.0–36.0)
MCV: 90.3 fL (ref 80.0–100.0)
Platelets: 173 10*3/uL (ref 150–400)
RBC: 3.6 MIL/uL — ABNORMAL LOW (ref 4.22–5.81)
RDW: 14 % (ref 11.5–15.5)
WBC: 3.7 10*3/uL — ABNORMAL LOW (ref 4.0–10.5)
nRBC: 0 % (ref 0.0–0.2)

## 2022-04-29 LAB — COMPREHENSIVE METABOLIC PANEL
ALT: 35 U/L (ref 0–44)
AST: 21 U/L (ref 15–41)
Albumin: 4 g/dL (ref 3.5–5.0)
Alkaline Phosphatase: 120 U/L (ref 38–126)
Anion gap: 15 (ref 5–15)
BUN: 80 mg/dL — ABNORMAL HIGH (ref 6–20)
CO2: 29 mmol/L (ref 22–32)
Calcium: 8 mg/dL — ABNORMAL LOW (ref 8.9–10.3)
Chloride: 97 mmol/L — ABNORMAL LOW (ref 98–111)
Creatinine, Ser: 8.71 mg/dL — ABNORMAL HIGH (ref 0.61–1.24)
GFR, Estimated: 8 mL/min — ABNORMAL LOW (ref >60)
Glucose, Bld: 158 mg/dL — ABNORMAL HIGH (ref 70–99)
Potassium: 2.8 mmol/L — ABNORMAL LOW (ref 3.5–5.1)
Sodium: 141 mmol/L (ref 135–145)
Total Bilirubin: 1.2 mg/dL (ref 0.3–1.2)
Total Protein: 7.7 g/dL (ref 6.5–8.1)

## 2022-04-29 LAB — LIPASE, BLOOD: Lipase: 40 U/L (ref 11–51)

## 2022-04-29 NOTE — ED Triage Notes (Signed)
Pt arrives via POV with multiple chief complaints. Pt reports primary CC of abdominal pain that began Friday (04/27/22). Reports 4 episodes of emesis in the last 24 hours. Denies diarrhea.

## 2022-04-30 ENCOUNTER — Other Ambulatory Visit: Payer: Self-pay

## 2022-04-30 ENCOUNTER — Encounter: Payer: Self-pay | Admitting: Internal Medicine

## 2022-04-30 ENCOUNTER — Observation Stay: Payer: Medicare HMO

## 2022-04-30 ENCOUNTER — Observation Stay
Admission: EM | Admit: 2022-04-30 | Discharge: 2022-05-01 | Disposition: A | Discharge status: Home / Self Care | Payer: Medicare HMO | Attending: Internal Medicine | Admitting: Internal Medicine

## 2022-04-30 DIAGNOSIS — K3184 Gastroparesis: Secondary | ICD-10-CM | POA: Diagnosis not present

## 2022-04-30 DIAGNOSIS — R1116 Cannabis hyperemesis syndrome: Secondary | ICD-10-CM | POA: Diagnosis present

## 2022-04-30 DIAGNOSIS — R112 Nausea with vomiting, unspecified: Secondary | ICD-10-CM | POA: Diagnosis not present

## 2022-04-30 DIAGNOSIS — Z992 Dependence on renal dialysis: Secondary | ICD-10-CM

## 2022-04-30 DIAGNOSIS — F1290 Cannabis use, unspecified, uncomplicated: Secondary | ICD-10-CM | POA: Diagnosis present

## 2022-04-30 DIAGNOSIS — G9341 Metabolic encephalopathy: Secondary | ICD-10-CM | POA: Diagnosis present

## 2022-04-30 DIAGNOSIS — I5042 Chronic combined systolic (congestive) and diastolic (congestive) heart failure: Secondary | ICD-10-CM | POA: Diagnosis not present

## 2022-04-30 DIAGNOSIS — R9431 Abnormal electrocardiogram [ECG] [EKG]: Secondary | ICD-10-CM | POA: Diagnosis present

## 2022-04-30 DIAGNOSIS — F418 Other specified anxiety disorders: Secondary | ICD-10-CM | POA: Diagnosis present

## 2022-04-30 DIAGNOSIS — D631 Anemia in chronic kidney disease: Secondary | ICD-10-CM | POA: Diagnosis present

## 2022-04-30 DIAGNOSIS — E876 Hypokalemia: Secondary | ICD-10-CM | POA: Diagnosis present

## 2022-04-30 DIAGNOSIS — I480 Paroxysmal atrial fibrillation: Secondary | ICD-10-CM | POA: Diagnosis present

## 2022-04-30 DIAGNOSIS — E108 Type 1 diabetes mellitus with unspecified complications: Secondary | ICD-10-CM | POA: Diagnosis not present

## 2022-04-30 DIAGNOSIS — K529 Noninfective gastroenteritis and colitis, unspecified: Secondary | ICD-10-CM | POA: Insufficient documentation

## 2022-04-30 DIAGNOSIS — I160 Hypertensive urgency: Secondary | ICD-10-CM | POA: Diagnosis present

## 2022-04-30 DIAGNOSIS — N186 End stage renal disease: Secondary | ICD-10-CM

## 2022-04-30 DIAGNOSIS — I10 Essential (primary) hypertension: Secondary | ICD-10-CM

## 2022-04-30 DIAGNOSIS — E440 Moderate protein-calorie malnutrition: Secondary | ICD-10-CM | POA: Diagnosis present

## 2022-04-30 HISTORY — DX: Nausea with vomiting, unspecified: R11.2

## 2022-04-30 LAB — RENAL FUNCTION PANEL
Albumin: 3.5 g/dL (ref 3.5–5.0)
Anion gap: 13 (ref 5–15)
BUN: 86 mg/dL — ABNORMAL HIGH (ref 6–20)
CO2: 31 mmol/L (ref 22–32)
Calcium: 7.5 mg/dL — ABNORMAL LOW (ref 8.9–10.3)
Chloride: 98 mmol/L (ref 98–111)
Creatinine, Ser: 9.51 mg/dL — ABNORMAL HIGH (ref 0.61–1.24)
GFR, Estimated: 7 mL/min — ABNORMAL LOW (ref >60)
Glucose, Bld: 117 mg/dL — ABNORMAL HIGH (ref 70–99)
Phosphorus: 7 mg/dL — ABNORMAL HIGH (ref 2.5–4.6)
Potassium: 3.1 mmol/L — ABNORMAL LOW (ref 3.5–5.1)
Sodium: 142 mmol/L (ref 135–145)

## 2022-04-30 LAB — HEPATITIS B SURFACE ANTIGEN: Hepatitis B Surface Ag: NONREACTIVE

## 2022-04-30 LAB — MAGNESIUM: Magnesium: 2.5 mg/dL — ABNORMAL HIGH (ref 1.7–2.4)

## 2022-04-30 LAB — CBG MONITORING, ED
Glucose-Capillary: 168 mg/dL — ABNORMAL HIGH (ref 70–99)
Glucose-Capillary: 89 mg/dL (ref 70–99)
Glucose-Capillary: 99 mg/dL (ref 70–99)

## 2022-04-30 LAB — POTASSIUM: Potassium: 3.5 mmol/L (ref 3.5–5.1)

## 2022-04-30 LAB — LIPASE, BLOOD: Lipase: 38 U/L (ref 11–51)

## 2022-04-30 MED ORDER — ANTICOAGULANT SODIUM CITRATE 4% (200MG/5ML) IV SOLN: 5.0000 mL | Status: DC | PRN

## 2022-04-30 MED ORDER — HEPARIN SODIUM (PORCINE) 1000 UNIT/ML DIALYSIS
1000.0000 [IU] | INTRAMUSCULAR | Status: DC | PRN
  Filled 2022-04-30: qty 1

## 2022-04-30 MED ORDER — HYDRALAZINE HCL 20 MG/ML IJ SOLN
10.0000 mg | INTRAMUSCULAR | Status: DC | PRN
  Administered 2022-05-01 (×2): 10 mg via INTRAVENOUS
  Filled 2022-04-30 (×2): qty 1

## 2022-04-30 MED ORDER — HYDRALAZINE HCL 20 MG/ML IJ SOLN
10.0000 mg | Freq: Once | INTRAMUSCULAR | Status: AC
  Administered 2022-04-30: 10 mg via INTRAVENOUS
  Filled 2022-04-30: qty 1

## 2022-04-30 MED ORDER — ALTEPLASE 2 MG IJ SOLR: 2.0000 mg | Freq: Once | INTRAMUSCULAR | Status: DC | PRN

## 2022-04-30 MED ORDER — ACETAMINOPHEN 325 MG PO TABS: 650.0000 mg | ORAL_TABLET | Freq: Four times a day (QID) | ORAL | Status: DC | PRN

## 2022-04-30 MED ORDER — POTASSIUM CHLORIDE 10 MEQ/100ML IV SOLN
10.0000 meq | INTRAVENOUS | Status: AC
  Administered 2022-04-30 (×2): 10 meq via INTRAVENOUS
  Filled 2022-04-30 (×2): qty 100

## 2022-04-30 MED ORDER — CHLORHEXIDINE GLUCONATE CLOTH 2 % EX PADS
6.0000 | MEDICATED_PAD | Freq: Every day | CUTANEOUS | Status: DC
  Filled 2022-04-30 (×2): qty 6

## 2022-04-30 MED ORDER — ENSURE ENLIVE PO LIQD: 237.0000 mL | Freq: Two times a day (BID) | ORAL | Status: DC

## 2022-04-30 MED ORDER — INSULIN ASPART 100 UNIT/ML IJ SOLN: 0.0000 [IU] | Freq: Three times a day (TID) | INTRAMUSCULAR | Status: DC

## 2022-04-30 MED ORDER — MORPHINE SULFATE (PF) 2 MG/ML IV SOLN
2.0000 mg | INTRAVENOUS | Status: DC | PRN
  Administered 2022-04-30 – 2022-05-01 (×4): 2 mg via INTRAVENOUS
  Filled 2022-04-30 (×4): qty 1

## 2022-04-30 MED ORDER — INSULIN ASPART 100 UNIT/ML IJ SOLN: 0.0000 [IU] | Freq: Every day | INTRAMUSCULAR | Status: DC

## 2022-04-30 MED ORDER — LIDOCAINE-PRILOCAINE 2.5-2.5 % EX CREA: 1.0000 | TOPICAL_CREAM | CUTANEOUS | Status: DC | PRN

## 2022-04-30 MED ORDER — FOSFOMYCIN TROMETHAMINE 3 G PO PACK
3.0000 g | PACK | Freq: Once | ORAL | Status: AC
  Administered 2022-04-30: 3 g via ORAL
  Filled 2022-04-30: qty 3

## 2022-04-30 MED ORDER — INSULIN ASPART 100 UNIT/ML IJ SOLN
0.0000 [IU] | Freq: Three times a day (TID) | INTRAMUSCULAR | Status: DC
  Administered 2022-04-30: 2 [IU] via SUBCUTANEOUS
  Filled 2022-04-30: qty 1

## 2022-04-30 MED ORDER — CARVEDILOL 6.25 MG PO TABS
3.1250 mg | ORAL_TABLET | Freq: Once | ORAL | Status: AC
  Administered 2022-04-30: 3.125 mg via ORAL
  Filled 2022-04-30: qty 1

## 2022-04-30 MED ORDER — NICOTINE 21 MG/24HR TD PT24
21.0000 mg | MEDICATED_PATCH | Freq: Every day | TRANSDERMAL | Status: DC
  Filled 2022-04-30: qty 1

## 2022-04-30 MED ORDER — DICYCLOMINE HCL 10 MG/ML IM SOLN
20.0000 mg | Freq: Once | INTRAMUSCULAR | Status: AC
  Administered 2022-04-30: 20 mg via INTRAMUSCULAR
  Filled 2022-04-30 (×2): qty 2

## 2022-04-30 MED ORDER — POTASSIUM CHLORIDE CRYS ER 20 MEQ PO TBCR
40.0000 meq | EXTENDED_RELEASE_TABLET | Freq: Once | ORAL | Status: DC
  Filled 2022-04-30: qty 2

## 2022-04-30 MED ORDER — POTASSIUM CHLORIDE 10 MEQ/100ML IV SOLN
10.0000 meq | Freq: Once | INTRAVENOUS | Status: AC
  Administered 2022-04-30: 10 meq via INTRAVENOUS
  Filled 2022-04-30: qty 100

## 2022-04-30 MED ORDER — SACUBITRIL-VALSARTAN 49-51 MG PO TABS
1.0000 | ORAL_TABLET | Freq: Once | ORAL | Status: AC
  Administered 2022-05-01: 1 via ORAL
  Filled 2022-04-30: qty 1

## 2022-04-30 MED ORDER — PENTAFLUOROPROP-TETRAFLUOROETH EX AERO: 1.0000 | INHALATION_SPRAY | CUTANEOUS | Status: DC | PRN

## 2022-04-30 MED ORDER — SACUBITRIL-VALSARTAN 49-51 MG PO TABS
1.0000 | ORAL_TABLET | Freq: Once | ORAL | Status: AC
  Administered 2022-04-30: 1 via ORAL
  Filled 2022-04-30: qty 1

## 2022-04-30 MED ORDER — OXYCODONE-ACETAMINOPHEN 5-325 MG PO TABS
1.0000 | ORAL_TABLET | ORAL | Status: DC | PRN
  Administered 2022-04-30 – 2022-05-01 (×4): 1 via ORAL
  Filled 2022-04-30 (×4): qty 1

## 2022-04-30 MED ORDER — HEPARIN SODIUM (PORCINE) 5000 UNIT/ML IJ SOLN
5000.0000 [IU] | Freq: Three times a day (TID) | INTRAMUSCULAR | Status: DC
  Administered 2022-04-30 – 2022-05-01 (×4): 5000 [IU] via SUBCUTANEOUS
  Filled 2022-04-30 (×4): qty 1

## 2022-04-30 MED ORDER — LIDOCAINE HCL (PF) 1 % IJ SOLN: 5.0000 mL | INTRAMUSCULAR | Status: DC | PRN

## 2022-04-30 MED ORDER — SODIUM CHLORIDE 0.9 % IV BOLUS (SEPSIS): 250.0000 mL | Freq: Once | INTRAVENOUS | Status: DC

## 2022-04-30 MED ORDER — DIPHENHYDRAMINE HCL 50 MG/ML IJ SOLN: 12.5000 mg | Freq: Three times a day (TID) | INTRAMUSCULAR | Status: DC | PRN

## 2022-04-30 MED ORDER — LORAZEPAM 2 MG/ML IJ SOLN
1.0000 mg | Freq: Once | INTRAMUSCULAR | Status: AC
  Administered 2022-04-30: 1 mg via INTRAVENOUS
  Filled 2022-04-30: qty 1

## 2022-04-30 NOTE — Progress Notes (Signed)
Patient stated cramping has subsided, uf is back on at this time. Will continue to monitor the patient closely.

## 2022-04-30 NOTE — Progress Notes (Signed)
[facility]  ROUNDING NOTE   Subjective:   Jonathan Mcmillan is a 26 year old black male with past medical conditions including gastroparesis, type 1 diabetes, hypertension, GERD, cannabinoid hyperemesis syndrome, anemia, paroxysmal atrial fibrillation, and end-stage renal disease on hemodialysis.  Patient presents to the emergency department with nausea, vomiting, and abdominal pain for 2 days.  Patient has been admitted under observation for Intractable nausea and vomiting [R11.2]  Patient is known to our practice and receives outpatient dialysis treatments at [facility] on a MWF schedule, supervised by Dr. Candiss Norse.  Last treatment received on Friday.  While reviewing outpatient notes, patient did receive 4 treatments last week due to excessive fluid volume.  Patient seen and evaluated during dialysis   HEMODIALYSIS FLOWSHEET:  Blood Flow Rate (mL/min): 400 mL/min Arterial Pressure (mmHg): -180 mmHg Venous Pressure (mmHg): 270 mmHg TMP (mmHg): 16 mmHg Ultrafiltration Rate (mL/min): 1120 mL/min Dialysate Flow Rate (mL/min): 300 ml/min  Patient states he has been having nausea and vomiting for 2 days.  Complains of associated abdominal pain.  Patient denies any sick contacts.  Denies known fever or chills.  Denies cough or shortness of breath.  Currently on room air.  No lower extremity edema  Labs on ED arrival significant for potassium 2.8, 158, BUN 80, creatinine 8.71 with GFR 8 calcium 8.0 magnesium 2.5, WBCs 3.7, and hemoglobin 10.7.  CT abdomen pelvis shows questionable cystitis.  We have been consulted to manage dialysis needs during this admission.   Objective:  Vital signs in last 24 hours:  Temp:  [97.1 F (36.2 C)-98.1 F (36.7 C)] 98 F (36.7 C) (12/18 1524) Pulse Rate:  [86-95] 91 (12/18 1328) Resp:  [11-26] 12 (12/18 1524) BP: (114-221)/(79-123) 150/103 (12/18 1524) SpO2:  [97 %-100 %] 100 % (12/18 1524) Weight:  [56.2 kg-59 kg] 56.2 kg (12/18  1346)  Weight change:  Filed Weights   04/29/22 2114 04/30/22 0932 04/30/22 1346  Weight: 59 kg 58.4 kg 56.2 kg    Intake/Output: No intake/output data recorded.   Intake/Output this shift:  Total I/O In: -  Out: 2000 [Other:2000]  Physical Exam: General: Ill-appearing  Head: Normocephalic, atraumatic.   Eyes: Anicteric  Lungs:  Clear to auscultation, normal effort, room air  Heart: Regular rate and rhythm  Abdomen:  Soft, nontender,   Extremities: No peripheral edema.  Neurologic: Nonfocal, moving all four extremities  Skin: No lesions  Access: Right PermCath    Basic Metabolic Panel: Recent Labs  Lab 04/29/22 2116 04/30/22 0719 04/30/22 0947  NA 141  --  142  K 2.8* 3.5 3.1*  CL 97*  --  98  CO2 29  --  31  GLUCOSE 158*  --  117*  BUN 80*  --  86*  CREATININE 8.71*  --  9.51*  CALCIUM 8.0*  --  7.5*  MG 2.5*  --   --   PHOS  --   --  7.0*    Liver Function Tests: Recent Labs  Lab 04/29/22 2116 04/30/22 0947  AST 21  --   ALT 35  --   ALKPHOS 120  --   BILITOT 1.2  --   PROT 7.7  --   ALBUMIN 4.0 3.5   Recent Labs  Lab 04/29/22 2116 04/30/22 0947  LIPASE 40 38   No results for input(s): "AMMONIA" in the last 168 hours.  CBC: Recent Labs  Lab 04/29/22 2116  WBC 3.7*  HGB 10.7*  HCT 32.5*  MCV 90.3  PLT 173    Cardiac Enzymes: No results for input(s): "CKTOTAL", "CKMB", "CKMBINDEX", "TROPONINI" in the last 168 hours.  BNP: Invalid input(s): "POCBNP"  CBG: Recent Labs  Lab 04/30/22 0746  GLUCAP 168*    Microbiology: Results for orders placed or performed during the hospital encounter of 08/24/21  Aerobic Culture w Gram Stain (superficial specimen)     Status: None   Collection Time: 08/24/21  1:00 PM   Specimen: Wound  Result Value Ref Range Status   Specimen Description   Final    WOUND Performed at [facility], [address] [place], [place] [address]    Special Requests   Final    LEFT FOOT Performed  at [facility], [address] [place], [place] [address]    Gram Stain   Final    MODERATE WBC PRESENT,BOTH PMN AND MONONUCLEAR ABUNDANT GRAM POSITIVE COCCI IN PAIRS ABUNDANT GRAM NEGATIVE RODS Performed at [facility], [place] [address] [place], [place] [address]    Culture   Final    MODERATE ESCHERICHIA COLI FEW STAPHYLOCOCCUS AUREUS    Report Status 08/28/2021 FINAL  Final   Organism ID, Bacteria ESCHERICHIA COLI  Final   Organism ID, Bacteria STAPHYLOCOCCUS AUREUS  Final      Susceptibility   Escherichia coli - MIC*    AMPICILLIN >=32 RESISTANT Resistant     CEFAZOLIN <=4 SENSITIVE Sensitive     CEFEPIME <=0.12 SENSITIVE Sensitive     CEFTAZIDIME <=1 SENSITIVE Sensitive     CEFTRIAXONE <=0.25 SENSITIVE Sensitive     CIPROFLOXACIN 1 RESISTANT Resistant     GENTAMICIN <=1 SENSITIVE Sensitive     IMIPENEM <=0.25 SENSITIVE Sensitive     TRIMETH/SULFA >=320 RESISTANT Resistant     AMPICILLIN/SULBACTAM 16 INTERMEDIATE Intermediate     PIP/TAZO <=4 SENSITIVE Sensitive     * MODERATE ESCHERICHIA COLI   Staphylococcus aureus - MIC*    CIPROFLOXACIN 2 INTERMEDIATE Intermediate     ERYTHROMYCIN <=0.25 SENSITIVE Sensitive     GENTAMICIN <=0.5 SENSITIVE Sensitive     OXACILLIN <=0.25 SENSITIVE Sensitive     TETRACYCLINE <=1 SENSITIVE Sensitive     VANCOMYCIN <=0.5 SENSITIVE Sensitive     TRIMETH/SULFA <=10 SENSITIVE Sensitive     CLINDAMYCIN <=0.25 SENSITIVE Sensitive     RIFAMPIN <=0.5 SENSITIVE Sensitive     Inducible Clindamycin NEGATIVE Sensitive     * FEW STAPHYLOCOCCUS AUREUS    Coagulation Studies: No results for input(s): "LABPROT", "INR" in the last 72 hours.  Urinalysis: No results for input(s): "COLORURINE", "LABSPEC", "PHURINE", "GLUCOSEU", "HGBUR", "BILIRUBINUR", "KETONESUR", "PROTEINUR", "UROBILINOGEN", "NITRITE", "LEUKOCYTESUR" in the last 72 hours.  Invalid input(s): "APPERANCEUR"    Imaging: CT ABDOMEN PELVIS WO CONTRAST  Result Date:  04/30/2022 CLINICAL DATA:  Abdominal pain EXAM: CT ABDOMEN AND PELVIS WITHOUT CONTRAST TECHNIQUE: Multidetector CT imaging of the abdomen and pelvis was performed following the standard protocol without IV contrast. RADIATION DOSE REDUCTION: This exam was performed according to the departmental dose-optimization program which includes automated exposure control, adjustment of the mA and/or kV according to patient size and/or use of iterative reconstruction technique. COMPARISON:  04/11/2022 FINDINGS: Lower chest: Lung bases are clear. Hepatobiliary: Unenhanced liver is unremarkable. Mild layering gallbladder sludge (series 2/image 30), without associated inflammatory changes. No intra right or extrahepatic duct dilatation. Pancreas: Within normal limits. Spleen: Within normal limits. Adrenals/Urinary Tract: Adrenal glands are within normal limits. Kidneys are within normal limits. No renal calculi or hydronephrosis. Thick-walled bladder, although underdistended, unchanged  from prior. Stomach/Bowel: Stomach is within normal limits. No evidence of bowel obstruction. Normal appendix (series 2/image 50). No colonic wall thickening or inflammatory changes. Vascular/Lymphatic: No evidence of abdominal aortic aneurysm. No suspicious abdominopelvic lymphadenopathy. Reproductive: Prostate is unremarkable. Other: No abdominopelvic ascites. Musculoskeletal: Visualized osseous structures are within normal limits. Chronic cutaneous thickening overlying the coccyx (series 2/image 9) and right gluteal region (series 2/image 89), likely related to prior ulcer/infection. No associated fluid collection/abscess. IMPRESSION: Mild bladder wall thickening, chronic, correlate for cystitis. Additional stable ancillary findings as above. No CT findings to account for the patient's abdominal pain. Electronically Signed   By: Julian Hy M.D.   On: 04/30/2022 00:19     Medications:    anticoagulant sodium citrate       Chlorhexidine Gluconate Cloth  6 each Topical Q0600   feeding supplement  237 mL Oral BID BM   heparin  5,000 Units Subcutaneous Q8H   insulin aspart  0-5 Units Subcutaneous QHS   insulin aspart  0-9 Units Subcutaneous TID WC   nicotine  21 mg Transdermal Daily   [START ON 05/01/2022] sacubitril-valsartan  1 tablet Oral Once   acetaminophen, alteplase, anticoagulant sodium citrate, diphenhydrAMINE, heparin, hydrALAZINE, lidocaine (PF), lidocaine-prilocaine, morphine injection, oxyCODONE-acetaminophen, pentafluoroprop-tetrafluoroeth  Assessment/ Plan:  [name] is a 26 y.o.  male with past medical conditions including gastroparesis, type 1 diabetes, hypertension, GERD, cannabinoid hyperemesis syndrome, anemia, paroxysmal atrial fibrillation, and end-stage renal disease on hemodialysis.  Patient presents to the emergency department with nausea, vomiting, and abdominal pain for 2 days.  Patient has been admitted under observation for Intractable nausea and vomiting [R11.2]  [facility]/MWF/right chest PermCath/57.5 kg  Hypokalemia with end-stage renal disease on hemodialysis.  Potassium on ED arrival 2.8.  Patient received IV supplementation in ED.  Patient will receive dialysis to further correct potassium.  Patient labs during dialysis show potassium 3.5.  Will continue dialysis on 3K bath.  Next treatment scheduled for Wednesday.  2. Anemia of chronic kidney disease Lab Results  Component Value Date   HGB 10.7 (L) 04/29/2022  Hemoglobin remains above desired target.  Will continue to monitor.  3. Secondary Hyperparathyroidism: with outpatient labs: PTH 664, phosphorus 10.1, calcium 6.1 on 04/23/22.   Lab Results  Component Value Date   PTH 94 (H) 06/20/2021   CALCIUM 7.5 (L) 04/30/2022   CAION 1.32 05/10/2007   PHOS 7.0 (H) 04/30/2022    Patient receives calcitriol and calcium carbonate outpatient.  Calcium and phosphorus remain outside of desired target however  improved from recent outpatient labs.  4.  Hypertension with chronic kidney disease.  Home regimen includes amiodarone, carvedilol, Entresto, and clonidine.  Currently receiving carvedilol and Entresto.   LOS: 0   12/18/20233:28 PM

## 2022-04-30 NOTE — ED Notes (Signed)
Pts  Aunt [facility] Lynelle Smoke Wageman [phone] called concerned about why pt is here in the hospital.  This RN explained that pt will be admitted for observation.

## 2022-04-30 NOTE — ED Provider Notes (Signed)
[facility] Provider Note    Event Date/Time   First MD Initiated Contact with Patient 04/30/22 [phone]     (approximate)   History   Abdominal Pain   HPI  Jonathan Mcmillan is a 26 y.o. male with history of cannabinoid hyperemesis syndrome, cardiomyopathy, diabetes, hypertension, end-stage renal disease on hemodialysis Monday Wednesday and Friday, gastroparesis who presents to the emergency department with complaints of nausea and vomiting since Friday.  Is having upper abdominal pain due to frequent vomiting.  No diarrhea, bloody stools or melena.  No chest pain or shortness of breath.  Last dialyzed on Friday.  No recent missed sessions.  No fever.  States he still makes urine.  Last smoked marijuana yesterday.   History provided by patient.    Past Medical History:  Diagnosis Date   Anemia of chronic renal failure    Anoxic encephalopathy (HCC)    Anxiety    Aortic atherosclerosis ([place])    Cannabinoid hyperemesis syndrome    Cardiac arrest ([place]) 06/01/2021   a.) PEA arrest in the setting of metabolic derangements; required intubation and CRRT.   Cardiomyopathy ([place]) 06/01/2021   a.) TTE 06/01/2021: EF 35-40%.   CHF (congestive heart failure) ([place])    a.) TTE 06/01/2021: EF 35-40%; global HK, mild LA enlargement; GLS -11.3%; ; mild MR/AR; G2DD. b.) TTE 06/05/2021: EF 35-40%; PASP 31.6 mmHg; mild MR, mild-mod TR.   COVID-19 05/22/2020   Depression    Diabetes 1.5, managed as type 1 ([place])    DKA (diabetic ketoacidoses) 05/21/2016   ESRD (end stage renal disease) on dialysis ([place])    Gastroparesis    GERD (gastroesophageal reflux disease)    History of 2019 novel coronavirus disease (COVID-19) 05/22/2021   HTN (hypertension)    Marijuana use    Nicotine dependence    PAF (paroxysmal atrial fibrillation) (HCC)    a.) CHA2DS2-VASc = 4 (CHF, HTN, aortic plaque, T1DM). b.) rate/rhythm maintained on oral carvedilol + amiodarone; no chronic  anticoagulation due to anemia and thrombocytopenia   Perirectal abscess 06/16/2017   Right arm cellulitis 01/26/2018   Thrombocytopenia (HCC)     Past Surgical History:  Procedure Laterality Date   AMPUTATION TOE Right 08/25/2020   partial 5th toe   AV FISTULA PLACEMENT Left 09/26/2021   Procedure: INSERTION OF ARTERIOVENOUS (AV) GORE-TEX GRAFT ARM (BRACHIAL AXILLARY );  Surgeon: Algernon Huxley, MD;  Location: [facility];  Service: Vascular;  Laterality: Left;   COLONOSCOPY WITH PROPOFOL N/A 06/21/2021   Procedure: COLONOSCOPY WITH PROPOFOL;  Surgeon: Lin Landsman, MD;  Location: [facility];  Service: Gastroenterology;  Laterality: N/A;   DEBRIDEMENT  FOOT Right 11/01/2019   wound   DIALYSIS/PERMA CATHETER INSERTION N/A 06/15/2021   Procedure: DIALYSIS/PERMA CATHETER INSERTION;  Surgeon: Algernon Huxley, MD;  Location: [facility];  Service: Cardiovascular;  Laterality: N/A;   DIALYSIS/PERMA CATHETER INSERTION N/A 06/19/2021   Procedure: DIALYSIS/PERMA CATHETER INSERTION;  Surgeon: Algernon Huxley, MD;  Location: [facility] [facility];  Service: Cardiovascular;  Laterality: N/A;   DIALYSIS/PERMA CATHETER REMOVAL N/A 11/21/2021   Procedure: DIALYSIS/PERMA CATHETER REMOVAL;  Surgeon: Katha Cabal, MD;  Location: [facility];  Service: Cardiovascular;  Laterality: N/A;   INCISION AND DRAINAGE PERIRECTAL ABSCESS N/A 06/16/2017   Procedure: IRRIGATION AND DEBRIDEMENT PERIRECTAL ABSCESS;  Surgeon: Florene Glen, MD;  Location: [facility];  Service: General;  Laterality: N/A;   none      MEDICATIONS:  Prior  to Admission medications   Medication Sig Start Date End Date Taking? Authorizing Provider  Accu-Chek Softclix Lancets lancets Use aas directed up to 4 times per day. 06/29/21   Kirsteins, Luanna Salk, MD  acetaminophen (TYLENOL) 325 MG tablet Take 1-2 tablets (325-650 mg total) by mouth every 4 (four) hours as needed for mild pain. 06/28/21   Setzer, Edman Circle, PA-C   ascorbic acid (VITAMIN C) 500 MG tablet Take 1 tablet (500 mg total) by mouth 2 (two) times daily. 06/28/21   Setzer, Edman Circle, PA-C  Blood Glucose Monitoring Suppl (BLOOD GLUCOSE MONITOR SYSTEM) w/Device KIT Use as directed up to 4 times per day. 06/29/21   Setzer, Edman Circle, PA-C  calcitRIOL (ROCALTROL) 0.5 MCG capsule Take 1 capsule (0.5 mcg total) by mouth daily. 06/28/21   Setzer, Edman Circle, PA-C  calcium carbonate (TUMS - DOSED IN MG ELEMENTAL CALCIUM) 500 MG chewable tablet Chew 1 tablet (200 mg of elemental calcium total) by mouth 3 (three) times daily. 06/28/21   Setzer, Edman Circle, PA-C  carvedilol (COREG) 3.125 MG tablet Take 1 tablet (3.125 mg total) by mouth 2 (two) times daily with a meal. 06/28/21   Setzer, Edman Circle, PA-C  cefdinir (OMNICEF) 300 MG capsule Take by mouth. 08/31/21   [provider]  cinacalcet (SENSIPAR) 30 MG tablet Take 1 tablet (30 mg total) by mouth daily with breakfast. 06/29/21   Setzer, Edman Circle, PA-C  collagenase (SANTYL) ointment Apply topically daily. 06/29/21   Setzer, Edman Circle, PA-C  Darbepoetin Alfa (ARANESP) 60 MCG/0.3ML SOSY injection Inject 0.3 mLs (60 mcg total) into the vein every Friday with hemodialysis. 06/30/21   Setzer, Edman Circle, PA-C  ferrous sulfate 325 (65 FE) MG tablet Take 1 tablet (325 mg total) by mouth daily with breakfast. 06/28/21   Setzer, Edman Circle, PA-C  glucose blood test strip Use as directed 06/29/21   Kirsteins, Luanna Salk, MD  hydrocerin (EUCERIN) CREA Apply 1 application topically 2 (two) times daily. 06/28/21   Setzer, Edman Circle, PA-C  insulin aspart (NOVOLOG) 100 UNIT/ML FlexPen Inject 0-6 Units into the skin 3 (three) times daily with meals. 06/28/21   Setzer, Edman Circle, PA-C  insulin aspart (NOVOLOG) 100 UNIT/ML injection Inject 0-15 Units into the skin 3 (three) times daily with meals. For blood glucose to 70-150 give 0 units; 151 to 200 give 1 unit; 201 to 250 give 2 units, 251-300 give 3 units. 301 or greater, call primary care  physician.. 06/28/21   Setzer, Edman Circle, PA-C  Insulin Pen Needle 32G X 4 MM MISC Use as directed 06/29/21   Kirsteins, Luanna Salk, MD  lipase/protease/amylase (CREON) 36000 UNITS CPEP capsule Take 1 capsule (36,000 Units total) by mouth 3 (three) times daily with meals. 06/28/21   Setzer, Edman Circle, PA-C  loperamide (IMODIUM) 2 MG capsule Take 1 capsule (2 mg total) by mouth 2 (two) times daily. 06/28/21   Setzer, Edman Circle, PA-C  methocarbamol (ROBAXIN) 500 MG tablet Take 1 tablet (500 mg total) by mouth every 6 (six) hours as needed for muscle spasms. 06/28/21   Setzer, Edman Circle, PA-C  multivitamin (RENA-VIT) TABS tablet Take 1 tablet by mouth at bedtime. 06/28/21   Setzer, Edman Circle, PA-C  Nutritional Supplements (,FEEDING SUPPLEMENT, PROSOURCE PLUS) liquid Take 30 mLs by mouth 3 (three) times daily between meals. 06/28/21   Setzer, Edman Circle, PA-C  omeprazole (PRILOSEC) 40 MG capsule Take 1 capsule (40 mg total) by mouth daily. 06/28/21 08/27/21  Setzer, Edman Circle, PA-C  ondansetron (ZOFRAN) 4 MG tablet Take 1 tablet (4 mg total) by mouth every 8 (eight) hours as needed for up to 10 doses for nausea or vomiting. 06/28/21   Setzer, Edman Circle, PA-C  oxyCODONE (OXY IR/ROXICODONE) 5 MG immediate release tablet Take 1 tablet (5 mg total) by mouth every 4 (four) hours as needed for severe pain. Patient not taking: Reported on 10/25/2021 06/28/21   Vaughan Basta, Edman Circle, PA-C  oxyCODONE-acetaminophen (PERCOCET) 5-325 MG tablet Take 1-2 tablets by mouth every 4 (four) hours as needed for severe pain. Patient not taking: Reported on 10/25/2021 09/26/21   Algernon Huxley, MD  sacubitril-valsartan (ENTRESTO) 49-51 MG Take 1 tablet by mouth 2 (two) times daily. 09/05/21   Kate Sable, MD  thiamine 100 MG tablet Take 1 tablet (100 mg total) by mouth daily. 06/29/21   Setzer, Edman Circle, PA-C  pantoprazole (PROTONIX) 40 MG tablet Take 1 tablet (40 mg total) by mouth daily. Patient not taking: Reported on 12/10/2018 09/10/18 01/02/20   Hillary Bow, MD  sucralfate (CARAFATE) 1 g tablet Take 1 tablet (1 g total) by mouth 4 (four) times daily. Patient not taking: Reported on 06/26/2019 02/04/19 01/02/20  Nance Pear, MD    Physical Exam   Triage Vital Signs: ED Triage Vitals  Enc Vitals Group     BP 04/29/22 2113 (!) 189/108     Pulse Rate 04/29/22 2113 94     Resp 04/29/22 2113 17     Temp 04/29/22 2113 98 F (36.7 C)     Temp Source 04/29/22 2113 Oral     SpO2 04/29/22 2113 99 %     Weight 04/29/22 2114 130 lb (59 kg)     Height 04/29/22 2114 _0  (1.753 m)     Head Circumference --      Peak Flow --      Pain Score 04/29/22 2114 10     Pain Loc --      Pain Edu? --      Excl. in [place]? --     Most recent vital signs: Vitals:   04/30/22 0600 04/30/22 0630  BP: (!) 185/107 (!) 157/107  Pulse: 94 90  Resp: 18 (!) 22  Temp:    SpO2: 99% 100%    CONSTITUTIONAL: Alert and oriented and responds appropriately to questions.  Chronically ill-appearing, appears much older than stated age HEAD: Normocephalic, atraumatic EYES: Conjunctivae clear, pupils appear equal, sclera nonicteric ENT: normal nose; moist mucous membranes NECK: Supple, normal ROM CARD: RRR; S1 and S2 appreciated; no murmurs, no clicks, no rubs, no gallops RESP: Normal chest excursion without splinting or tachypnea; breath sounds clear and equal bilaterally; no wheezes, no rhonchi, no rales, no hypoxia or respiratory distress, speaking full sentences ABD/GI: Normal bowel sounds; non-distended; soft, non-tender, no rebound, no guarding, no peritoneal signs BACK: The back appears normal EXT: Normal ROM in all joints; no deformity noted, no edema; no cyanosis, fistula in the left upper extremity with normal thrill and bruit, 2+ left radial pulse SKIN: Normal color for age and race; warm; no rash on exposed skin NEURO: Moves all extremities equally, normal speech PSYCH: The patient's mood and manner are appropriate.   ED Results / Procedures  / Treatments   LABS: (all labs ordered are listed, but only abnormal results are displayed) Labs Reviewed  COMPREHENSIVE METABOLIC PANEL - Abnormal; Notable for the following components:      Result Value   Potassium 2.8 (*)    Chloride 97 (*)  Glucose, Bld 158 (*)    BUN 80 (*)    Creatinine, Ser 8.71 (*)    Calcium 8.0 (*)    GFR, Estimated 8 (*)    All other components within normal limits  CBC - Abnormal; Notable for the following components:   WBC 3.7 (*)    RBC 3.60 (*)    Hemoglobin 10.7 (*)    HCT 32.5 (*)    All other components within normal limits  MAGNESIUM - Abnormal; Notable for the following components:   Magnesium 2.5 (*)    All other components within normal limits  LIPASE, BLOOD  URINALYSIS, ROUTINE W REFLEX MICROSCOPIC  URINE DRUG SCREEN, QUALITATIVE ([facility] ONLY)  POTASSIUM     EKG:  EKG Interpretation  Date/Time:  Monday April 30 2022 04:26:19 EST Ventricular Rate:  97 PR Interval:  167 QRS Duration: 88 QT Interval:  414 QTC Calculation: 526 R Axis:   48 Text Interpretation: Sinus rhythm Probable left atrial enlargement Nonspecific T abnrm, anterolateral leads Prolonged QT interval Confirmed by Pryor Curia [phone]) on 04/30/2022 6:28:51 AM          RADIOLOGY: My personal review and interpretation of imaging: CT of the abdomen pelvis shows possible cystitis but no other acute abnormality.  I have personally reviewed all radiology reports.   CT ABDOMEN PELVIS WO CONTRAST  Result Date: 04/30/2022 CLINICAL DATA:  Abdominal pain EXAM: CT ABDOMEN AND PELVIS WITHOUT CONTRAST TECHNIQUE: Multidetector CT imaging of the abdomen and pelvis was performed following the standard protocol without IV contrast. RADIATION DOSE REDUCTION: This exam was performed according to the departmental dose-optimization program which includes automated exposure control, adjustment of the mA and/or kV according to patient size and/or use of iterative reconstruction  technique. COMPARISON:  04/11/2022 FINDINGS: Lower chest: Lung bases are clear. Hepatobiliary: Unenhanced liver is unremarkable. Mild layering gallbladder sludge (series 2/image 30), without associated inflammatory changes. No intra right or extrahepatic duct dilatation. Pancreas: Within normal limits. Spleen: Within normal limits. Adrenals/Urinary Tract: Adrenal glands are within normal limits. Kidneys are within normal limits. No renal calculi or hydronephrosis. Thick-walled bladder, although underdistended, unchanged from prior. Stomach/Bowel: Stomach is within normal limits. No evidence of bowel obstruction. Normal appendix (series 2/image 50). No colonic wall thickening or inflammatory changes. Vascular/Lymphatic: No evidence of abdominal aortic aneurysm. No suspicious abdominopelvic lymphadenopathy. Reproductive: Prostate is unremarkable. Other: No abdominopelvic ascites. Musculoskeletal: Visualized osseous structures are within normal limits. Chronic cutaneous thickening overlying the coccyx (series 2/image 9) and right gluteal region (series 2/image 89), likely related to prior ulcer/infection. No associated fluid collection/abscess. IMPRESSION: Mild bladder wall thickening, chronic, correlate for cystitis. Additional stable ancillary findings as above. No CT findings to account for the patient's abdominal pain. Electronically Signed   By: Julian Hy M.D.   On: 04/30/2022 00:19     PROCEDURES:  Critical Care performed: Yes, see critical care procedure note(s)   CRITICAL CARE Performed by: Cyril Mourning Marley Pakula   Total critical care time: 45 minutes  Critical care time was exclusive of separately billable procedures and treating other patients.  Critical care was necessary to treat or prevent imminent or life-threatening deterioration.  Critical care was time spent personally by me on the following activities: development of treatment plan with patient and/or surrogate as well as nursing,  discussions with consultants, evaluation of patient's response to treatment, examination of patient, obtaining history from patient or surrogate, ordering and performing treatments and interventions, ordering and review of laboratory studies, ordering and review of radiographic studies,  pulse oximetry and re-evaluation of patient's condition.   [name]1-3 Lead EKG Interpretation  Performed by: Denajah Farias, Delice Bison, DO Authorized by: Keesha Pellum, Delice Bison, DO     Interpretation: normal     ECG rate:  86   ECG rate assessment: normal     Rhythm: sinus rhythm     Ectopy: none     Conduction: normal       IMPRESSION / MDM / ASSESSMENT AND PLAN / ED COURSE  I reviewed the triage vital signs and the nursing notes.    Patient here with cyclic vomiting.  History of gastroparesis and cannabinol hyperemesis syndrome.  The patient is on the cardiac monitor to evaluate for evidence of arrhythmia and/or significant heart rate changes.   DIFFERENTIAL DIAGNOSIS (includes but not limited to):   Dehydration, cyclic vomiting, gastroparesis, cannabinoid hyperemesis syndrome, doubt appendicitis, bowel obstruction, colitis, diverticulitis   Patient's presentation is most consistent with acute presentation with potential threat to life or bodily function.   PLAN: Workup initiated from triage.  Patient has a leukopenia here.  Anemia which is chronic and improved for patient from his chronic kidney disease.  Potassium is 2.8 and EKG does show prolonged QT interval which is new for him.  Will give IV potassium as as he states he cannot tolerate oral potassium due to recurrent vomiting.  Magnesium level is normal.  Normal LFTs and lipase today.  CT scan obtained from triage reviewed and interpreted by myself and the radiologist and shows possible chronic cystitis but no other acute abnormality.  He still makes urine.  Will obtain urinalysis, drug screen.  Will give Bentyl, Ativan for symptomatic relief and  reassess.   MEDICATIONS GIVEN IN ED: Medications  carvedilol (COREG) tablet 3.125 mg (has no administration in time range)  sacubitril-valsartan (ENTRESTO) 49-51 mg per tablet (has no administration in time range)  fosfomycin (MONUROL) packet 3 g (has no administration in time range)  LORazepam (ATIVAN) injection 1 mg (1 mg Intravenous Given 04/30/22 0301)  dicyclomine (BENTYL) injection 20 mg (20 mg Intramuscular Given 04/30/22 0305)  potassium chloride 10 mEq in 100 mL IVPB (0 mEq Intravenous Stopped 04/30/22 0517)  hydrALAZINE (APRESOLINE) injection 10 mg (10 mg Intravenous Given 04/30/22 0346)     ED COURSE: Patient is extremely hypertensive here.  Unable to keep any medications at home down.  Will give IV hydralazine.  Blood pressure improved slightly with IV hydralazine.  Patient no longer vomiting and able to tolerate entire cup of ginger ale.  Will give oral home medications of Entresto and Coreg.   Patient refusing to give urine sample and refusing to allow catheterization for urine.  CT scan did show possible cystitis.  Will give dose of fosfomycin here.   Plan is to recheck EKG once potassium complete to ensure QT interval has improved so that we can discharge him with antiemetics.   6:48 AM  Pt's IV potassium complete but still has prolonged QT interval.  On review of multiple previous EKGs, this appears new.  Will recheck a potassium level but have to give him potassium very cautiously given he is on dialysis.  I feel at this time he will need admission as there will not be any antiemetics that I will be able to safely discharge him with.  Will discuss with the hospitalist.  CONSULTS:  Consulted and discussed patient's case with hospitalist, Dr. Blaine Hamper.  I have recommended admission and consulting physician agrees and will place admission orders.  Patient (and family if  present) agree with this plan.   I reviewed all nursing notes, vitals, pertinent previous records.  All  labs, EKGs, imaging ordered have been independently reviewed and interpreted by myself.    OUTSIDE RECORDS REVIEWED: Reviewed patient's last admission to [facility] in September 2023 for osteomyelitis and postoperative pain of the right foot.       FINAL CLINICAL IMPRESSION(S) / ED DIAGNOSES   Final diagnoses:  Cannabinoid hyperemesis syndrome  Prolonged Q-T interval on ECG  Hypokalemia  Uncontrolled hypertension     Rx / DC Orders   ED Discharge Orders     None        Note:  This document was prepared using Dragon voice recognition software and may include unintentional dictation errors.   Taraoluwa Thakur, Delice Bison, DO 04/30/22 [phone]

## 2022-04-30 NOTE — ED Notes (Signed)
Patient states he is unable to provide urine sample.

## 2022-04-30 NOTE — ED Notes (Signed)
Patient tolerated PO challenge well.  

## 2022-04-30 NOTE — Progress Notes (Signed)
Reduced patient goal from 3l to 2l per instruction of np

## 2022-04-30 NOTE — ED Notes (Signed)
MD Ward notified of BP.

## 2022-04-30 NOTE — H&P (Signed)
History and Physical    Jonathan Mcmillan VFI:[id number] DOB: 1996/01/22 DOA: 04/30/2022  Referring MD/NP/PA:   PCP: Denton Lank, MD   Patient coming from:  The patient is coming from home.    Chief Complaint: nausea vomiting and abdominal pain  HPI: Jonathan Mcmillan is a 26 y.o. male with medical history significant of ESRD-HD (MWF), type 1 diabetes, gastroparesis, cannabinoid hyperemesis syndrome, hypertension, GERD, depression with anxiety, PAF not on anticoagulants, anemia, who presents with nausea vomiting, abdominal pain.  Patient is lethargic, but arousable, when aroused, patient is oriented x 3.  Patient answered questions appropriately. Patient states that he has nausea, vomiting, abdominal pain for more than 4 days.  He has multiple episodes of intractable nonbilious nonbloody vomiting each day.  His abdominal pain is in the central abdomen, constant, aching, nonradiating.  No diarrhea.  Patient denies chest pain, cough, shortness breath.  Patient denies symptoms of UTI.  No fever or chills.  Patient states that he had a dialysis on Friday.  Data reviewed independently and ED Course: pt was found to have WBC 3.7, potassium 2.8, bicarbonate 29, creatinine 8.7, BUN 80, magnesium 2.5, liver function normal.  Temperature normal, blood pressure 121/113, 157/107, heart rate 95, RR 23, oxygen saturation 99% on room air.  Patient is placed on PCU for observation. Renal is consulted.  CT-abdomen/pelvis: Mild bladder wall thickening, chronic, correlate for cystitis. Additional stable ancillary findings as above. No CT findings to account for the patient's abdominal pain.  EKG: I have personally reviewed.  Sinus rhythm, QTc 529, LAE, LAD.   Review of Systems:   General: no fevers, chills, no body weight gain, has poor appetite, has fatigue HEENT: no blurry vision, hearing changes or sore throat Respiratory: no dyspnea, coughing, wheezing CV: no chest pain, no palpitations GI: has  nausea, vomiting, abdominal pain, no diarrhea, constipation GU: no dysuria, burning on urination, increased urinary frequency, hematuria  Ext: no leg edema Neuro: no unilateral weakness, numbness, or tingling, no vision change or hearing loss Skin: no rash, no skin tear. MSK: No muscle spasm, no deformity, no limitation of range of movement in spin Heme: No easy bruising.  Travel history: No recent long distant travel.   Allergy: No Known Allergies  Past Medical History:  Diagnosis Date   Anemia of chronic renal failure    Anoxic encephalopathy (HCC)    Anxiety    Aortic atherosclerosis ([place])    Cannabinoid hyperemesis syndrome    Cardiac arrest ([place]) 06/01/2021   a.) PEA arrest in the setting of metabolic derangements; required intubation and CRRT.   Cardiomyopathy ([place]) 06/01/2021   a.) TTE 06/01/2021: EF 35-40%.   CHF (congestive heart failure) ([place])    a.) TTE 06/01/2021: EF 35-40%; global HK, mild LA enlargement; GLS -11.3%; ; mild MR/AR; G2DD. b.) TTE 06/05/2021: EF 35-40%; PASP 31.6 mmHg; mild MR, mild-mod TR.   COVID-19 05/22/2020   Depression    Diabetes 1.5, managed as type 1 ([place])    DKA (diabetic ketoacidoses) 05/21/2016   ESRD (end stage renal disease) on dialysis ([place])    Gastroparesis    GERD (gastroesophageal reflux disease)    History of 2019 novel coronavirus disease (COVID-19) 05/22/2021   HTN (hypertension)    Intractable nausea and vomiting 04/30/2022   Marijuana use    Nicotine dependence    PAF (paroxysmal atrial fibrillation) (HCC)    a.) CHA2DS2-VASc = 4 (CHF, HTN, aortic plaque, T1DM). b.) rate/rhythm maintained on oral carvedilol + amiodarone; no chronic  anticoagulation due to anemia and thrombocytopenia   Perirectal abscess 06/16/2017   Right arm cellulitis 01/26/2018   Thrombocytopenia [facility])     Past Surgical History:  Procedure Laterality Date   AMPUTATION TOE Right 08/25/2020   partial 5th toe   AV FISTULA PLACEMENT Left 09/26/2021    Procedure: INSERTION OF ARTERIOVENOUS (AV) GORE-TEX GRAFT ARM (BRACHIAL AXILLARY );  Surgeon: Algernon Huxley, MD;  Location: [facility] ORS;  Service: Vascular;  Laterality: Left;   COLONOSCOPY WITH PROPOFOL N/A 06/21/2021   Procedure: COLONOSCOPY WITH PROPOFOL;  Surgeon: Lin Landsman, MD;  Location: [facility];  Service: Gastroenterology;  Laterality: N/A;   DEBRIDEMENT  FOOT Right 11/01/2019   wound   DIALYSIS/PERMA CATHETER INSERTION N/A 06/15/2021   Procedure: DIALYSIS/PERMA CATHETER INSERTION;  Surgeon: Algernon Huxley, MD;  Location: [place] [facility];  Service: Cardiovascular;  Laterality: N/A;   DIALYSIS/PERMA CATHETER INSERTION N/A 06/19/2021   Procedure: DIALYSIS/PERMA CATHETER INSERTION;  Surgeon: Algernon Huxley, MD;  Location: [place] [facility];  Service: Cardiovascular;  Laterality: N/A;   DIALYSIS/PERMA CATHETER REMOVAL N/A 11/21/2021   Procedure: DIALYSIS/PERMA CATHETER REMOVAL;  Surgeon: Katha Cabal, MD;  Location: [place] [facility];  Service: Cardiovascular;  Laterality: N/A;   INCISION AND DRAINAGE PERIRECTAL ABSCESS N/A 06/16/2017   Procedure: IRRIGATION AND DEBRIDEMENT PERIRECTAL ABSCESS;  Surgeon: Florene Glen, MD;  Location: [facility] OR[facility];  Service: General;  Laterality: N/A;   none      Social History:  reports that he has been smoking cigarettes. He has been smoking an average of .5 packs per day. He has never used smokeless tobacco. He reports current drug use. Drug: Marijuana. He reports that he does not drink alcohol.  Family History:  Family History  Problem Relation Age of Onset   Diabetes Mother      Prior to Admission medications   Medication Sig Start Date End Date Taking? Authorizing Provider  Accu-Chek Softclix Lancets lancets Use aas directed up to 4 times per day. 06/29/21   Kirsteins, Luanna Salk, MD  acetaminophen (TYLENOL) 325 MG tablet Take 1-2 tablets (325-650 mg total) by mouth every 4 (four) hours as needed for mild pain. 06/28/21    Setzer, Edman Circle, PA-C  ascorbic acid (VITAMIN C) 500 MG tablet Take 1 tablet (500 mg total) by mouth 2 (two) times daily. 06/28/21   Setzer, Edman Circle, PA-C  Blood Glucose Monitoring Suppl (BLOOD GLUCOSE MONITOR SYSTEM) w/Device KIT Use as directed up to 4 times per day. 06/29/21   Setzer, Edman Circle, PA-C  calcitRIOL (ROCALTROL) 0.5 MCG capsule Take 1 capsule (0.5 mcg total) by mouth daily. 06/28/21   Setzer, Edman Circle, PA-C  calcium carbonate (TUMS - DOSED IN MG ELEMENTAL CALCIUM) 500 MG chewable tablet Chew 1 tablet (200 mg of elemental calcium total) by mouth 3 (three) times daily. 06/28/21   Setzer, Edman Circle, PA-C  carvedilol (COREG) 3.125 MG tablet Take 1 tablet (3.125 mg total) by mouth 2 (two) times daily with a meal. 06/28/21   Setzer, Edman Circle, PA-C  cefdinir (OMNICEF) 300 MG capsule Take by mouth. 08/31/21   [provider]  cinacalcet (SENSIPAR) 30 MG tablet Take 1 tablet (30 mg total) by mouth daily with breakfast. 06/29/21   Setzer, Edman Circle, PA-C  collagenase (SANTYL) ointment Apply topically daily. 06/29/21   Setzer, Edman Circle, PA-C  Darbepoetin Alfa (ARANESP) 60 MCG/0.3ML SOSY injection Inject 0.3 mLs (60 mcg total) into the vein every Friday with hemodialysis. 06/30/21  Setzer, Edman Circle, PA-C  ferrous sulfate 325 (65 FE) MG tablet Take 1 tablet (325 mg total) by mouth daily with breakfast. 06/28/21   Setzer, Edman Circle, PA-C  glucose blood test strip Use as directed 06/29/21   Kirsteins, Luanna Salk, MD  hydrocerin (EUCERIN) CREA Apply 1 application topically 2 (two) times daily. 06/28/21   Setzer, Edman Circle, PA-C  insulin aspart (NOVOLOG) 100 UNIT/ML FlexPen Inject 0-6 Units into the skin 3 (three) times daily with meals. 06/28/21   Setzer, Edman Circle, PA-C  insulin aspart (NOVOLOG) 100 UNIT/ML injection Inject 0-15 Units into the skin 3 (three) times daily with meals. For blood glucose to 70-150 give 0 units; 151 to 200 give 1 unit; 201 to 250 give 2 units, 251-300 give 3 units. 301 or greater,  call primary care physician.. 06/28/21   Setzer, Edman Circle, PA-C  Insulin Pen Needle 32G X 4 MM MISC Use as directed 06/29/21   Kirsteins, Luanna Salk, MD  lipase/protease/amylase (CREON) 36000 UNITS CPEP capsule Take 1 capsule (36,000 Units total) by mouth 3 (three) times daily with meals. 06/28/21   Setzer, Edman Circle, PA-C  loperamide (IMODIUM) 2 MG capsule Take 1 capsule (2 mg total) by mouth 2 (two) times daily. 06/28/21   Setzer, Edman Circle, PA-C  methocarbamol (ROBAXIN) 500 MG tablet Take 1 tablet (500 mg total) by mouth every 6 (six) hours as needed for muscle spasms. 06/28/21   Setzer, Edman Circle, PA-C  multivitamin (RENA-VIT) TABS tablet Take 1 tablet by mouth at bedtime. 06/28/21   Setzer, Edman Circle, PA-C  Nutritional Supplements (,FEEDING SUPPLEMENT, PROSOURCE PLUS) liquid Take 30 mLs by mouth 3 (three) times daily between meals. 06/28/21   Setzer, Edman Circle, PA-C  omeprazole (PRILOSEC) 40 MG capsule Take 1 capsule (40 mg total) by mouth daily. 06/28/21 08/27/21  Setzer, Edman Circle, PA-C  ondansetron (ZOFRAN) 4 MG tablet Take 1 tablet (4 mg total) by mouth every 8 (eight) hours as needed for up to 10 doses for nausea or vomiting. 06/28/21   Setzer, Edman Circle, PA-C  oxyCODONE (OXY IR/ROXICODONE) 5 MG immediate release tablet Take 1 tablet (5 mg total) by mouth every 4 (four) hours as needed for severe pain. Patient not taking: Reported on 10/25/2021 06/28/21   Vaughan Basta, Edman Circle, PA-C  oxyCODONE-acetaminophen (PERCOCET) 5-325 MG tablet Take 1-2 tablets by mouth every 4 (four) hours as needed for severe pain. Patient not taking: Reported on 10/25/2021 09/26/21   Algernon Huxley, MD  sacubitril-valsartan (ENTRESTO) 49-51 MG Take 1 tablet by mouth 2 (two) times daily. 09/05/21   Kate Sable, MD  thiamine 100 MG tablet Take 1 tablet (100 mg total) by mouth daily. 06/29/21   Setzer, Edman Circle, PA-C  pantoprazole (PROTONIX) 40 MG tablet Take 1 tablet (40 mg total) by mouth daily. Patient not taking: Reported on 12/10/2018  09/10/18 01/02/20  Hillary Bow, MD  sucralfate (CARAFATE) 1 g tablet Take 1 tablet (1 g total) by mouth 4 (four) times daily. Patient not taking: Reported on 06/26/2019 02/04/19 01/02/20  Nance Pear, MD    Physical Exam: Vitals:   04/30/22 1100 04/30/22 1130 04/30/22 1200 04/30/22 1230  BP: 114/87 114/79 (!) 127/93 114/86  Pulse: 88 87 88 94  Resp: _0 Temp:      TempSrc:      SpO2: 100% 100% 100% 100%  Weight:      Height:       General: Not in acute distress HEENT:  Eyes: PERRL, EOMI, no scleral icterus.       ENT: No discharge from the ears and nose, no pharynx injection, no tonsillar enlargement.        Neck: No JVD, no bruit, no mass felt. Heme: No neck lymph node enlargement. Cardiac: S1/S2, RRR, No murmurs, No gallops or rubs. Respiratory: No rales, wheezing, rhonchi or rubs. GI: Soft, nondistended, has tenderness in central abdomen, no rebound pain, no organomegaly, BS present. GU: No hematuria Ext: No pitting leg edema bilaterally. 1+DP/PT pulse bilaterally. Musculoskeletal: No joint deformities, No joint redness or warmth, no limitation of ROM in spin. Skin: No rashes.  Neuro: Lethargic, arousable, oriented X3, cranial nerves II-XII grossly intact, moves all extremities Psych: Patient is not psychotic, no suicidal or hemocidal ideation.  Labs on Admission: I have personally reviewed following labs and imaging studies  CBC: Recent Labs  Lab 04/29/22 2116  WBC 3.7*  HGB 10.7*  HCT 32.5*  MCV 90.3  PLT 277   Basic Metabolic Panel: Recent Labs  Lab 04/29/22 2116 04/30/22 0719 04/30/22 0947  NA 141  --  142  K 2.8* 3.5 3.1*  CL 97*  --  98  CO2 29  --  31  GLUCOSE 158*  --  117*  BUN 80*  --  86*  CREATININE 8.71*  --  9.51*  CALCIUM 8.0*  --  7.5*  MG 2.5*  --   --   PHOS  --   --  7.0*   GFR: Estimated Creatinine Clearance: 9.6 mL/min (A) (by C-G formula based on SCr of 9.51 mg/dL (H)). Liver Function Tests: Recent Labs  Lab  04/29/22 2116 04/30/22 0947  AST 21  --   ALT 35  --   ALKPHOS 120  --   BILITOT 1.2  --   PROT 7.7  --   ALBUMIN 4.0 3.5   Recent Labs  Lab 04/29/22 2116  LIPASE 40   No results for input(s): "AMMONIA" in the last 168 hours. Coagulation Profile: No results for input(s): "INR", "PROTIME" in the last 168 hours. Cardiac Enzymes: No results for input(s): "CKTOTAL", "CKMB", "CKMBINDEX", "TROPONINI" in the last 168 hours. BNP (last 3 results) No results for input(s): "PROBNP" in the last 8760 hours. HbA1C: No results for input(s): "HGBA1C" in the last 72 hours. CBG: Recent Labs  Lab 04/30/22 0746  GLUCAP 168*   Lipid Profile: No results for input(s): "CHOL", "HDL", "LDLCALC", "TRIG", "CHOLHDL", "LDLDIRECT" in the last 72 hours. Thyroid Function Tests: No results for input(s): "TSH", "T4TOTAL", "FREET4", "T3FREE", "THYROIDAB" in the last 72 hours. Anemia Panel: No results for input(s): "VITAMINB12", "FOLATE", "FERRITIN", "TIBC", "IRON", "RETICCTPCT" in the last 72 hours. Urine analysis:    Component Value Date/Time   COLORURINE AMBER (A) 06/01/2021 1023   APPEARANCEUR TURBID (A) 06/01/2021 1023   LABSPEC 1.017 06/01/2021 1023   PHURINE 6.0 06/01/2021 1023   GLUCOSEU 50 (A) 06/01/2021 1023   HGBUR MODERATE (A) 06/01/2021 1023   BILIRUBINUR NEGATIVE 06/01/2021 1023   KETONESUR NEGATIVE 06/01/2021 1023   PROTEINUR >=300 (A) 06/01/2021 1023   UROBILINOGEN 0.2 05/10/2007 2155   NITRITE NEGATIVE 06/01/2021 1023   LEUKOCYTESUR LARGE (A) 06/01/2021 1023   Sepsis Labs: _0 (procalcitonin:4,lacticidven:4) )No results found for this or any previous visit (from the past 240 hour(s)).   Radiological Exams on Admission: CT ABDOMEN PELVIS WO CONTRAST  Result Date: 04/30/2022 CLINICAL DATA:  Abdominal pain EXAM: CT ABDOMEN AND PELVIS WITHOUT CONTRAST TECHNIQUE: Multidetector CT imaging of the abdomen and pelvis  was performed following the standard protocol without IV  contrast. RADIATION DOSE REDUCTION: This exam was performed according to the departmental dose-optimization program which includes automated exposure control, adjustment of the mA and/or kV according to patient size and/or use of iterative reconstruction technique. COMPARISON:  04/11/2022 FINDINGS: Lower chest: Lung bases are clear. Hepatobiliary: Unenhanced liver is unremarkable. Mild layering gallbladder sludge (series 2/image 30), without associated inflammatory changes. No intra right or extrahepatic duct dilatation. Pancreas: Within normal limits. Spleen: Within normal limits. Adrenals/Urinary Tract: Adrenal glands are within normal limits. Kidneys are within normal limits. No renal calculi or hydronephrosis. Thick-walled bladder, although underdistended, unchanged from prior. Stomach/Bowel: Stomach is within normal limits. No evidence of bowel obstruction. Normal appendix (series 2/image 50). No colonic wall thickening or inflammatory changes. Vascular/Lymphatic: No evidence of abdominal aortic aneurysm. No suspicious abdominopelvic lymphadenopathy. Reproductive: Prostate is unremarkable. Other: No abdominopelvic ascites. Musculoskeletal: Visualized osseous structures are within normal limits. Chronic cutaneous thickening overlying the coccyx (series 2/image 9) and right gluteal region (series 2/image 89), likely related to prior ulcer/infection. No associated fluid collection/abscess. IMPRESSION: Mild bladder wall thickening, chronic, correlate for cystitis. Additional stable ancillary findings as above. No CT findings to account for the patient's abdominal pain. Electronically Signed   By: Julian Hy M.D.   On: 04/30/2022 00:19      Assessment/Plan Principal Problem:   Intractable nausea and vomiting Active Problems:   Gastroparesis   Cannabinoid hyperemesis syndrome   Hypertensive urgency   Type 1 diabetes mellitus with complication (HCC)   Paroxysmal atrial fibrillation (HCC)   Chronic  combined systolic and diastolic CHF (congestive heart failure) (HCC)   ESRD on dialysis ([place])   Hypokalemia   QT prolongation   Anemia in ESRD (end-stage renal disease) ([place])   Acute metabolic encephalopathy   Depression with anxiety   Protein-calorie malnutrition, moderate (HCC)   Assessment and Plan:  Intractable nausea and vomiting and AP: CT abdomen/pelvis is negative mild bladder wall thickening.  Potential differential diagnoses include gastroparesis and cannabinoid hyperemesis syndrome.  Patient has prolonged QTc, difficult to choose antinausea vomiting medications.  -Placed on PCU for observation due to hypertensive urgency -As needed Benadryl for nausea vomiting, and as needed Ativan -Check UDS -check lipase -As needed morphine and Percocet for pain  Gastroparesis -see above  Cannabinoid hyperemesis syndrome -see above -f/u UDS  Hypertensive urgency: Blood pressure 221/113, which improved to 157/107.  Patient is currently not taking his blood pressure medications.  Patient is supposed to take Coreg, Entresto -IV hydralazine as needed -Restart Coreg and Entresto  Type 1 diabetes mellitus with complication [facility]): Recent A1c 5.7, well-controlled.  Patient is not taking his NovoLog currently.  Blood sugar 158. -SSI  Paroxysmal atrial fibrillation [facility]): Heart rate 95.  Not taking anticoagulants. -Coreg  Chronic combined systolic and diastolic CHF (congestive heart failure) ([place]): 2D echo 06/05/2020 showed EF of 35-40%.  Patient does not have leg edema JVD.  CHF is compensated. -Volume management per renal by dialysis  ESRD on dialysis [facility]) -Consulted Dr. Candiss Norse of renal for dialysis  Hypokalemia: Potassium 2.8.  Magnesium 2.5 -Repleted potassium with 10 mEq x 3 -Patient will have dialysis.  QT prolongation: May be due to hypokalemia. -Repleted potassium -Avoid using QT prolonging medications.  Anemia in ESRD (end-stage renal disease) ([place]): Hemoglobin 10.7 (11.8  on 04/11/2022), slightly dropped. -Follow-up with CBC  Acute metabolic encephalopathy: Etiology is not clear.  Patient has possible UTI by CT scan of abdomen/pelvis -Frequent neurocheck -Follow-up CT of head  Depression with anxiety: Patient not on any medications currently -Observe closely  Protein-calorie malnutrition, moderate ([place]): BMI 19.01, body weight 58.4 kg. -Ensure -Consult to nutrition  Possible UTI: CT of abdomen/pelvis that showed bladder wall thickening indicating possible cystitis. -Follow-up urinalysis -Patient received 1 dose of fosfomycin 3 g in the ED -Follow-up urine culture    DVT ppx: SQ Heparin   Code Status: Full code  Family Communication: not done, no family member is at bed side.     Disposition Plan:  Anticipate discharge back to previous environment  Consults called: Dr. Candiss Norse of renal  Admission status and Level of care: Progressive:  for obs    Dispo: The patient is from: Home              Anticipated d/c is to: Home              Anticipated d/c date is: 1 day              Patient currently is not medically stable to d/c.    Severity of Illness:  The appropriate patient status for this patient is OBSERVATION. Observation status is judged to be reasonable and necessary in order to provide the required intensity of service to ensure the patient's safety. The patient's presenting symptoms, physical exam findings, and initial radiographic and laboratory data in the context of their medical condition is felt to place them at decreased risk for further clinical deterioration. Furthermore, it is anticipated that the patient will be medically stable for discharge from the hospital within 2 midnights of admission.        Date of Service 04/30/2022    [facility]   If 7PM-7AM, please contact night-coverage [email or web address] 04/30/2022, 12:47 PM

## 2022-04-30 NOTE — Discharge Planning (Signed)
[facility]  [address], [place] [address] [phone]  Scheduled Days:  Monday Wednesday and Friday  Treatment Time: 6:15am  Above schedule confirmed with Will CNM, Will stated that patient usually walks to treatments or family/friend transports.

## 2022-04-30 NOTE — Progress Notes (Signed)
Patient did 3.5 hours of HD, tolerated well.   UF: 2L    04/30/22 1328  Vitals  Temp (!) 97.1 F (36.2 C)  Temp Source Oral  BP 124/84  MAP (mmHg) 98  BP Location Right Arm  BP Method Automatic  Patient Position (if appropriate) Lying  Pulse Rate 91  Pulse Rate Source Monitor  ECG Heart Rate 88  Resp 18  Oxygen Therapy  SpO2 100 %  O2 Device Room Air  Patient Activity (if Appropriate) In bed  Pulse Oximetry Type Continuous  Post Treatment  Dialyzer Clearance Lightly streaked  Duration of HD Treatment -hour(s) 3.5 hour(s)  Hemodialysis Intake (mL) 0 mL  Liters Processed 84  Fluid Removed (mL) 2000 mL  Tolerated HD Treatment Yes  AVG/AVF Arterial Site Held (minutes) 5 minutes  AVG/AVF Venous Site Held (minutes) 5 minutes  Fistula / Graft Left Upper arm Arteriovenous vein graft  Placement Date/Time: 09/26/21 1304   Placed prior to admission: No  Orientation: Left  Access Location: Upper arm  Access Type: Arteriovenous vein graft  Site Condition No complications  Fistula / Graft Assessment Present;Thrill;Bruit  Status Deaccessed  Drainage Description None

## 2022-04-30 NOTE — Progress Notes (Signed)
Patient complaints of cramping in his legs, uf is off at this time. Will continue to monitor closely. RN Helene Kelp is aware.

## 2022-04-30 NOTE — ED Notes (Signed)
Confirmed with dialysis that patient will have labs drawn (including potassium) prior to dialysis starting.

## 2022-04-30 NOTE — ED Notes (Signed)
Attempted to administer ordered potassium medication, patient states he does not think he can keep anything down right now due to "being in excruciating pain"

## 2022-05-01 DIAGNOSIS — K529 Noninfective gastroenteritis and colitis, unspecified: Secondary | ICD-10-CM | POA: Insufficient documentation

## 2022-05-01 DIAGNOSIS — N186 End stage renal disease: Secondary | ICD-10-CM | POA: Diagnosis not present

## 2022-05-01 DIAGNOSIS — R112 Nausea with vomiting, unspecified: Secondary | ICD-10-CM | POA: Diagnosis not present

## 2022-05-01 DIAGNOSIS — K3184 Gastroparesis: Secondary | ICD-10-CM | POA: Diagnosis not present

## 2022-05-01 DIAGNOSIS — F1290 Cannabis use, unspecified, uncomplicated: Secondary | ICD-10-CM | POA: Diagnosis not present

## 2022-05-01 LAB — CBC
HCT: 38.1 % — ABNORMAL LOW (ref 39.0–52.0)
Hemoglobin: 12.6 g/dL — ABNORMAL LOW (ref 13.0–17.0)
MCH: 29.4 pg (ref 26.0–34.0)
MCHC: 33.1 g/dL (ref 30.0–36.0)
MCV: 88.8 fL (ref 80.0–100.0)
Platelets: 169 10*3/uL (ref 150–400)
RBC: 4.29 MIL/uL (ref 4.22–5.81)
RDW: 13.9 % (ref 11.5–15.5)
WBC: 3 10*3/uL — ABNORMAL LOW (ref 4.0–10.5)
nRBC: 0 % (ref 0.0–0.2)

## 2022-05-01 LAB — BASIC METABOLIC PANEL
Anion gap: 16 — ABNORMAL HIGH (ref 5–15)
BUN: 40 mg/dL — ABNORMAL HIGH (ref 6–20)
CO2: 28 mmol/L (ref 22–32)
Calcium: 8.5 mg/dL — ABNORMAL LOW (ref 8.9–10.3)
Chloride: 96 mmol/L — ABNORMAL LOW (ref 98–111)
Creatinine, Ser: 6.39 mg/dL — ABNORMAL HIGH (ref 0.61–1.24)
GFR, Estimated: 11 mL/min — ABNORMAL LOW (ref >60)
Glucose, Bld: 103 mg/dL — ABNORMAL HIGH (ref 70–99)
Potassium: 2.9 mmol/L — ABNORMAL LOW (ref 3.5–5.1)
Sodium: 140 mmol/L (ref 135–145)

## 2022-05-01 LAB — HEPATITIS B SURFACE ANTIBODY, QUANTITATIVE: Hep B S AB Quant (Post): <3.1 m[IU]/mL — ABNORMAL LOW (ref >9.9)

## 2022-05-01 LAB — GLUCOSE, CAPILLARY
Glucose-Capillary: 106 mg/dL — ABNORMAL HIGH (ref 70–99)
Glucose-Capillary: 121 mg/dL — ABNORMAL HIGH (ref 70–99)

## 2022-05-01 MED ORDER — RENA-VITE PO TABS
1.0000 | ORAL_TABLET | Freq: Every day | ORAL | Status: DC
  Filled 2022-05-01: qty 1

## 2022-05-01 MED ORDER — SACUBITRIL-VALSARTAN 49-51 MG PO TABS: 1.0000 | ORAL_TABLET | Freq: Two times a day (BID) | ORAL | Status: DC

## 2022-05-01 MED ORDER — SACUBITRIL-VALSARTAN 49-51 MG PO TABS
1.0000 | ORAL_TABLET | Freq: Two times a day (BID) | ORAL | Status: DC
  Filled 2022-05-01: qty 1

## 2022-05-01 MED ORDER — POTASSIUM CHLORIDE 10 MEQ/100ML IV SOLN
10.0000 meq | INTRAVENOUS | Status: AC
  Administered 2022-05-01 (×2): 10 meq via INTRAVENOUS
  Filled 2022-05-01 (×2): qty 100

## 2022-05-01 MED ORDER — CALCIUM ACETATE (PHOS BINDER) 667 MG PO CAPS
2001.0000 mg | ORAL_CAPSULE | Freq: Three times a day (TID) | ORAL | Status: DC
  Filled 2022-05-01 (×2): qty 3

## 2022-05-01 MED ORDER — POTASSIUM CHLORIDE CRYS ER 20 MEQ PO TBCR
40.0000 meq | EXTENDED_RELEASE_TABLET | ORAL | Status: DC
  Administered 2022-05-01: 40 meq via ORAL
  Filled 2022-05-01: qty 2

## 2022-05-01 MED ORDER — CALCITRIOL 0.25 MCG PO CAPS
0.5000 ug | ORAL_CAPSULE | Freq: Every day | ORAL | Status: DC
  Filled 2022-05-01: qty 2

## 2022-05-01 MED ORDER — CARVEDILOL 3.125 MG PO TABS
3.1250 mg | ORAL_TABLET | Freq: Two times a day (BID) | ORAL | Status: DC
  Administered 2022-05-01: 3.125 mg via ORAL
  Filled 2022-05-01: qty 1

## 2022-05-01 MED ORDER — POTASSIUM CHLORIDE 10 MEQ/100ML IV SOLN
10.0000 meq | Freq: Once | INTRAVENOUS | Status: AC
  Administered 2022-05-01: 10 meq via INTRAVENOUS
  Filled 2022-05-01 (×2): qty 100

## 2022-05-01 MED ORDER — PANTOPRAZOLE SODIUM 40 MG PO TBEC
40.0000 mg | DELAYED_RELEASE_TABLET | Freq: Every day | ORAL | Status: DC
  Administered 2022-05-01: 40 mg via ORAL
  Filled 2022-05-01: qty 1

## 2022-05-01 MED ORDER — CINACALCET HCL 30 MG PO TABS
30.0000 mg | ORAL_TABLET | Freq: Every day | ORAL | Status: DC
  Filled 2022-05-01: qty 1

## 2022-05-01 NOTE — Progress Notes (Signed)
Pt refused second round of PO potassium stating he is not taking anymore pills.  MD notified.

## 2022-05-01 NOTE — Discharge Summary (Addendum)
Physician Discharge Summary   Patient: Jonathan Mcmillan MRN: [id number] DOB: 1995/06/25  Admit date:     04/30/2022  Discharge date: 05/01/22  Discharge Physician: Sharen Hones   PCP: Denton Lank, MD   Recommendations at discharge:   Follow-up with PCP in 1 week. Follow-up with nephrology for continued dialysis.  Discharge Diagnoses: Principal Problem:   Intractable nausea and vomiting Active Problems:   Gastroparesis   Cannabinoid hyperemesis syndrome   Hypertensive urgency   Type 1 diabetes mellitus with complication (HCC)   Paroxysmal atrial fibrillation (HCC)   Chronic combined systolic and diastolic CHF (congestive heart failure) (HCC)   ESRD on dialysis (HCC)   Hypokalemia   QT prolongation   Anemia in ESRD (end-stage renal disease) ([place])   Acute metabolic encephalopathy   Depression with anxiety   Protein-calorie malnutrition, moderate ([place])   Gastroenteritis  Resolved Problems:   * No resolved hospital problems. *  Hospital Course: Jonathan Mcmillan is a 26 y.o. male with medical history significant of ESRD-HD (MWF), type 1 diabetes, gastroparesis, cannabinoid hyperemesis syndrome, hypertension, GERD, depression with anxiety, PAF not on anticoagulants, anemia, who presents with nausea vomiting, abdominal pain.   Diagnosed with acute gastroenteritis, given symptomatic treatment.  Patient was also dialyzed, condition has improved today, medically stable to be discharged.  Assessment and Plan: Intractable nausea vomiting. Gastroenteritis. Gastroparesis. Cannabinoid hyperemesis syndrome. Symptoms most likely due to viral infection in addition to marijuana use. .  To quit. Condition has improved today.  Patient no longer has any nausea vomiting diarrhea.  Medically stable to be discharged.  End-stage renal disease. Hypokalemia. Patient was dialyzed yesterday, he received potassium yesterday.  Will give additional oral and IV potassium.  Patient be followed with  nephrology for dialysis to adjust electrolytes. Discussed with nephrology, patient does not need to dialyze today, next scheduled dialysis is tomorrow.  Patient is cleared for discharge from their standpoint.  Hypertension urgency. Condition improved after dialysis.  Chronic combined systolic and diastolic congestive heart. Appears to be compensated, resume home medicines.  Acute metabolic encephalopathy. Possible UTI. Patient with UTI as the patient does not relate much urine.  He has been treated with 1 dose of fosfomycin, no additional treatment is needed. Urine culture not sent out at admission.  Moderate protein calorie malnutrition. Encourage protein intake.  Type 1 diabetes with end-stage renal disease. Patient hemoglobin A1c was 5.7, well-controlled.  Patient has not been taking insulin, he has never had a DKA.  Looks like he is not needing any insulin at this time.        Consultants: Nephrology Procedures performed: HD  Disposition: Home Diet recommendation:  Discharge Diet Orders (From admission, onward)     Start     Ordered   05/01/22 0000  Diet general       Comments: Renal diet   05/01/22 0943           Renal diet DISCHARGE MEDICATION: Allergies as of 05/01/2022   No Known Allergies      Medication List     STOP taking these medications    amiodarone 200 MG tablet Commonly known as: PACERONE   ascorbic acid 500 MG tablet Commonly known as: VITAMIN C   cefdinir 300 MG capsule Commonly known as: OMNICEF   methocarbamol 500 MG tablet Commonly known as: ROBAXIN   multivitamin Tabs tablet   ondansetron 4 MG disintegrating tablet Commonly known as: ZOFRAN-ODT   ondansetron 4 MG tablet Commonly known as: Zofran   oxyCODONE  5 MG immediate release tablet Commonly known as: Oxy IR/ROXICODONE   oxyCODONE-acetaminophen 5-325 MG tablet Commonly known as: Percocet   pantoprazole 40 MG tablet Commonly known as: PROTONIX   thiamine 100  MG tablet Commonly known as: VITAMIN B1       TAKE these medications    (feeding supplement) PROSource Plus liquid Take 30 mLs by mouth 3 (three) times daily between meals.   Accu-Chek Guide test strip Generic drug: glucose blood Use as directed   Accu-Chek Guide w/Device Kit Use as directed up to 4 times per day.   Accu-Chek Softclix Lancets lancets Use aas directed up to 4 times per day.   acetaminophen 325 MG tablet Commonly known as: TYLENOL Take 1-2 tablets (325-650 mg total) by mouth every 4 (four) hours as needed for mild pain.   BD Pen Needle Nano U/F 32G X 4 MM Misc Generic drug: Insulin Pen Needle Use as directed   calcitRIOL 0.5 MCG capsule Commonly known as: ROCALTROL Take 1 capsule (0.5 mcg total) by mouth daily.   calcium acetate 667 MG capsule Commonly known as: PHOSLO Take 2,001 mg by mouth 3 (three) times daily.   Calcium Antacid 500 MG chewable tablet Generic drug: calcium carbonate Chew 1 tablet (200 mg of elemental calcium total) by mouth 3 (three) times daily.   carvedilol 3.125 MG tablet Commonly known as: COREG Take 1 tablet (3.125 mg total) by mouth 2 (two) times daily with a meal.   cinacalcet 30 MG tablet Commonly known as: SENSIPAR Take 1 tablet (30 mg total) by mouth daily with breakfast.   collagenase 250 UNIT/GM ointment Commonly known as: SANTYL Apply topically daily.   Darbepoetin Alfa 60 MCG/0.3ML Sosy injection Commonly known as: ARANESP Inject 0.3 mLs (60 mcg total) into the vein every Friday with hemodialysis.   Entresto 49-51 MG Generic drug: sacubitril-valsartan Take 1 tablet by mouth 2 (two) times daily. What changed: Another medication with the same name was removed. Continue taking this medication, and follow the directions you see here.   FeroSul 325 (65 FE) MG tablet Generic drug: ferrous sulfate Take 1 tablet (325 mg total) by mouth daily with breakfast.   hydrocerin Crea Apply 1 application topically 2  (two) times daily.   insulin aspart 100 UNIT/ML injection Commonly known as: novoLOG Inject 0-15 Units into the skin 3 (three) times daily with meals. For blood glucose to 70-150 give 0 units; 151 to 200 give 1 unit; 201 to 250 give 2 units, 251-300 give 3 units. 301 or greater, call primary care physician.. What changed: Another medication with the same name was removed. Continue taking this medication, and follow the directions you see here.   lidocaine-prilocaine cream Commonly known as: EMLA Apply 1 Application topically once.   lipase/protease/amylase 36000 UNITS Cpep capsule Commonly known as: CREON Take 1 capsule (36,000 Units total) by mouth 3 (three) times daily with meals.   loperamide 2 MG capsule Commonly known as: IMODIUM Take 1 capsule (2 mg total) by mouth 2 (two) times daily.   omeprazole 40 MG capsule Commonly known as: PRILOSEC Take 1 capsule (40 mg total) by mouth daily.               Discharge Care Instructions  (From admission, onward)           Start     Ordered   05/01/22 0000  Discharge wound care:       Comments: Follow with hd   05/01/22 9518  Follow-up Information     Denton Lank, MD. Schedule an appointment as soon as possible for a visit .   Specialty: Family Medicine Contact information: [address] [address] [phone]         Denton Lank, MD Follow up in 1 week(s).   Specialty: Family Medicine Contact information: [address] [address] [phone]                Discharge Exam: Filed Weights   04/30/22 0932 04/30/22 1346 05/01/22 0500  Weight: 58.4 kg 56.2 kg 56.2 kg   General exam: Appears calm and comfortable  Respiratory system: Clear to auscultation. Respiratory effort normal. Cardiovascular system: S1 & S2 heard, RRR. No JVD, murmurs, rubs, gallops or clicks. No pedal edema. Gastrointestinal system: Abdomen is nondistended, soft  and nontender. No organomegaly or masses felt. Normal bowel sounds heard. Central nervous system: Alert and oriented. No focal neurological deficits. Extremities: Symmetric 5 x 5 power. Skin: No rashes, lesions or ulcers Psychiatry: Judgement and insight appear normal. Mood & affect appropriate.    Condition at discharge: fair  The results of significant diagnostics from this hospitalization (including imaging, microbiology, ancillary and laboratory) are listed below for reference.   Imaging Studies: CT HEAD WO CONTRAST (5MM)  Result Date: 04/30/2022 CLINICAL DATA:  Mental status change EXAM: CT HEAD WITHOUT CONTRAST TECHNIQUE: Contiguous axial images were obtained from the base of the skull through the vertex without intravenous contrast. RADIATION DOSE REDUCTION: This exam was performed according to the departmental dose-optimization program which includes automated exposure control, adjustment of the mA and/or kV according to patient size and/or use of iterative reconstruction technique. COMPARISON:  CT brain 06/03/2021 FINDINGS: Brain: No acute territorial infarction, hemorrhage or intracranial mass. The ventricles are nonenlarged. Vascular: No hyperdense vessel or unexpected calcification. Skull: Normal. Negative for fracture or focal lesion. Sinuses/Orbits: No acute finding. Other: Scalp skin thickening and edema, not significantly changed. IMPRESSION: No CT evidence for acute intracranial abnormality. Electronically Signed   By: Donavan Foil M.D.   On: 04/30/2022 15:59   CT ABDOMEN PELVIS WO CONTRAST  Result Date: 04/30/2022 CLINICAL DATA:  Abdominal pain EXAM: CT ABDOMEN AND PELVIS WITHOUT CONTRAST TECHNIQUE: Multidetector CT imaging of the abdomen and pelvis was performed following the standard protocol without IV contrast. RADIATION DOSE REDUCTION: This exam was performed according to the departmental dose-optimization program which includes automated exposure control, adjustment of  the mA and/or kV according to patient size and/or use of iterative reconstruction technique. COMPARISON:  04/11/2022 FINDINGS: Lower chest: Lung bases are clear. Hepatobiliary: Unenhanced liver is unremarkable. Mild layering gallbladder sludge (series 2/image 30), without associated inflammatory changes. No intra right or extrahepatic duct dilatation. Pancreas: Within normal limits. Spleen: Within normal limits. Adrenals/Urinary Tract: Adrenal glands are within normal limits. Kidneys are within normal limits. No renal calculi or hydronephrosis. Thick-walled bladder, although underdistended, unchanged from prior. Stomach/Bowel: Stomach is within normal limits. No evidence of bowel obstruction. Normal appendix (series 2/image 50). No colonic wall thickening or inflammatory changes. Vascular/Lymphatic: No evidence of abdominal aortic aneurysm. No suspicious abdominopelvic lymphadenopathy. Reproductive: Prostate is unremarkable. Other: No abdominopelvic ascites. Musculoskeletal: Visualized osseous structures are within normal limits. Chronic cutaneous thickening overlying the coccyx (series 2/image 9) and right gluteal region (series 2/image 89), likely related to prior ulcer/infection. No associated fluid collection/abscess. IMPRESSION: Mild bladder wall thickening, chronic, correlate for cystitis. Additional stable ancillary findings as above. No CT findings to account for the patient's abdominal  pain. Electronically Signed   By: Julian Hy M.D.   On: 04/30/2022 00:19   CT ABDOMEN PELVIS WO CONTRAST  Result Date: 04/11/2022 CLINICAL DATA:  Generalized lower abdominal pain for 2 days EXAM: CT ABDOMEN AND PELVIS WITHOUT CONTRAST TECHNIQUE: Multidetector CT imaging of the abdomen and pelvis was performed following the standard protocol without IV contrast. RADIATION DOSE REDUCTION: This exam was performed according to the departmental dose-optimization program which includes automated exposure control,  adjustment of the mA and/or kV according to patient size and/or use of iterative reconstruction technique. COMPARISON:  CT 07/05/2021 FINDINGS: Lower chest: The lower esophagus is mildly distended with fluid. Cardiomegaly. Hepatobiliary: No focal liver abnormality is seen. No gallstones, gallbladder wall thickening, or biliary dilatation. Pancreas: Unremarkable. No pancreatic ductal dilatation or surrounding inflammatory changes. Spleen: Normal in size without focal abnormality. Adrenals/Urinary Tract: Adrenal glands are unremarkable. Kidneys are normal, without renal calculi, focal lesion, or hydronephrosis. Bladder wall thickening. Stomach/Bowel: Stomach is within normal limits. Appendix appears normal. No evidence of bowel wall thickening, distention, or inflammatory changes. Vascular/Lymphatic: No significant vascular findings are present. No enlarged abdominal or pelvic lymph nodes. Reproductive: Prostate is unremarkable. Other: Small amount of free fluid in the pelvis. Mesenteric edema. No free intraperitoneal air. Musculoskeletal: Mild body wall edema. Soft tissue thickening overlying the sacrococcygeal region. Cortical irregularity about the posterior margin of S5 is redemonstrated. IMPRESSION: Bladder wall thickening may be due to underdistention or cystitis. Soft tissue thickening in the posterior subcutaneous fat along the sacrococcygeal area with loss of cortical margin of S5 suggestive of chronic osteomyelitis. The previous gas in this area has resolved. Mild body wall edema, mesenteric edema, and small amount of free fluid in the pelvis may be related to volume status. Electronically Signed   By: Placido Sou M.D.   On: 04/11/2022 23:13    Microbiology: Results for orders placed or performed during the hospital encounter of 08/24/21  Aerobic Culture w Gram Stain (superficial specimen)     Status: None   Collection Time: 08/24/21  1:00 PM   Specimen: Wound  Result Value Ref Range Status    Specimen Description   Final    WOUND Performed at [facility], [address] [place], [place] [address]    Special Requests   Final    LEFT FOOT Performed at [facility], [address] [place], [place] [address]    Gram Stain   Final    MODERATE WBC PRESENT,BOTH PMN AND MONONUCLEAR ABUNDANT GRAM POSITIVE COCCI IN PAIRS ABUNDANT GRAM NEGATIVE RODS Performed at [facility], [place] [address] [place], [place] [address]    Culture   Final    MODERATE ESCHERICHIA COLI FEW STAPHYLOCOCCUS AUREUS    Report Status 08/28/2021 FINAL  Final   Organism ID, Bacteria ESCHERICHIA COLI  Final   Organism ID, Bacteria STAPHYLOCOCCUS AUREUS  Final      Susceptibility   Escherichia coli - MIC*    AMPICILLIN >=32 RESISTANT Resistant     CEFAZOLIN <=4 SENSITIVE Sensitive     CEFEPIME <=0.12 SENSITIVE Sensitive     CEFTAZIDIME <=1 SENSITIVE Sensitive     CEFTRIAXONE <=0.25 SENSITIVE Sensitive     CIPROFLOXACIN 1 RESISTANT Resistant     GENTAMICIN <=1 SENSITIVE Sensitive     IMIPENEM <=0.25 SENSITIVE Sensitive     TRIMETH/SULFA >=320 RESISTANT Resistant     AMPICILLIN/SULBACTAM 16 INTERMEDIATE Intermediate     PIP/TAZO <=4 SENSITIVE Sensitive     * MODERATE ESCHERICHIA  COLI   Staphylococcus aureus - MIC*    CIPROFLOXACIN 2 INTERMEDIATE Intermediate     ERYTHROMYCIN <=0.25 SENSITIVE Sensitive     GENTAMICIN <=0.5 SENSITIVE Sensitive     OXACILLIN <=0.25 SENSITIVE Sensitive     TETRACYCLINE <=1 SENSITIVE Sensitive     VANCOMYCIN <=0.5 SENSITIVE Sensitive     TRIMETH/SULFA <=10 SENSITIVE Sensitive     CLINDAMYCIN <=0.25 SENSITIVE Sensitive     RIFAMPIN <=0.5 SENSITIVE Sensitive     Inducible Clindamycin NEGATIVE Sensitive     * FEW STAPHYLOCOCCUS AUREUS    Labs: CBC: Recent Labs  Lab 04/29/22 2116 05/01/22 0419  WBC 3.7* 3.0*  HGB 10.7* 12.6*  HCT 32.5* 38.1*  MCV 90.3 88.8  PLT 173 421   Basic Metabolic Panel: Recent Labs  Lab 04/29/22 2116  04/30/22 0719 04/30/22 0947 05/01/22 0419  NA 141  --  142 140  K 2.8* 3.5 3.1* 2.9*  CL 97*  --  98 96*  CO2 29  --  31 28  GLUCOSE 158*  --  117* 103*  BUN 80*  --  86* 40*  CREATININE 8.71*  --  9.51* 6.39*  CALCIUM 8.0*  --  7.5* 8.5*  MG 2.5*  --   --   --   PHOS  --   --  7.0*  --    Liver Function Tests: Recent Labs  Lab 04/29/22 2116 04/30/22 0947  AST 21  --   ALT 35  --   ALKPHOS 120  --   BILITOT 1.2  --   PROT 7.7  --   ALBUMIN 4.0 3.5   CBG: Recent Labs  Lab 04/30/22 0746 04/30/22 1634 04/30/22 2236 05/01/22 0903  GLUCAP 168* 89 99 106*    Discharge time spent: greater than 30 minutes.  Signed: Sharen Hones, MD [facility] 05/01/2022

## 2022-05-01 NOTE — TOC Initial Note (Signed)
Transition of Care [facility]) - Initial/Assessment Note    Patient Details  Name: Jonathan Mcmillan MRN: [id number] Date of Birth: August 01, 1995  Transition of Care [facility]) CM/SW Contact:    Laurena Slimmer, RN Phone Number: 05/01/2022, 9:55 AM  Clinical Narrative:                  Transition of Care [facility]) Screening Note   Patient Details  Name: Jonathan Mcmillan Date of Birth: January 28, 1996   Transition of Care [facility]) CM/SW Contact:    Laurena Slimmer, RN Phone Number: 05/01/2022, 9:56 AM    Transition of Care Department [facility]) has reviewed patient and no TOC needs have been identified at this time. We will continue to monitor patient advancement through interdisciplinary progression rounds. If new patient transition needs arise, please place a TOC consult.           Patient Goals and CMS Choice        Expected Discharge Plan and Services           Expected Discharge Date: 05/01/22                                    Prior Living Arrangements/Services                       Activities of Daily Living Home Assistive Devices/Equipment: None ADL Screening (condition at time of admission) Patient's cognitive ability adequate to safely complete daily activities?: Yes Is the patient deaf or have difficulty hearing?: No Does the patient have difficulty seeing, even when wearing glasses/contacts?: No Does the patient have difficulty concentrating, remembering, or making decisions?: No Patient able to express need for assistance with ADLs?: Yes Does the patient have difficulty dressing or bathing?: No Independently performs ADLs?: Yes (appropriate for developmental age) Does the patient have difficulty walking or climbing stairs?: No Weakness of Legs: None Weakness of Arms/Hands: None  Permission Sought/Granted                  Emotional Assessment              Admission diagnosis:  Hypokalemia [E87.6] Prolonged Q-T interval on ECG  [R94.31] Uncontrolled hypertension [I10] Intractable nausea and vomiting [R11.2] Cannabinoid hyperemesis syndrome [R11.2, F12.90] Patient Active Problem List   Diagnosis Date Noted   Gastroenteritis 05/01/2022   Intractable nausea and vomiting 04/30/2022   Hypokalemia 04/30/2022   QT prolongation 04/30/2022   Hypertensive urgency 04/30/2022   Depression with anxiety 04/30/2022   Gastroparesis 04/30/2022   Anemia in ESRD (end-stage renal disease) ([place]) 04/30/2022   Cannabinoid hyperemesis syndrome 04/30/2022   Protein-calorie malnutrition, moderate ([place]) 53/61/4431   Acute metabolic encephalopathy 54/00/8676   Anoxic encephalopathy ([place]) 06/23/2021   ESRD on dialysis ([place]) 06/22/2021   Debility 06/22/2021   Aspiration pneumonia ([place]) 19/50/9326   Complication associated with dialysis catheter    Paroxysmal atrial fibrillation ([place]) 06/14/2021   Chronic combined systolic and diastolic CHF (congestive heart failure) ([place]) 06/14/2021   Elevated troponin 06/14/2021   Hypocalcemia 06/14/2021   Pressure injury of buttock, stage 2 ([place]) 06/14/2021   ESRD (end stage renal disease) ([place])    Thrombocytopenia ([place])    Type 1 diabetes mellitus with complication ([place]) 71/24/5809   Nicotine dependence 05/31/2021   Polysubstance abuse ([place])    Malnutrition of moderate degree 05/22/2016   Hypertension 10/05/2010   PCP:  Posey Pronto,  Judson Roch, MD Pharmacy:   [facility], [place] [address] [place] [address] Phone: [phone] Fax: [phone]     Social Determinants of Health (SDOH) Interventions    Readmission Risk Interventions    06/13/2020    1:12 PM  Readmission Risk Prevention Plan  Transportation Screening Complete  Palliative Care Screening Not Applicable  Medication Review (RN Care Manager) Complete

## 2022-05-01 NOTE — Plan of Care (Signed)

## 2022-05-01 NOTE — Progress Notes (Signed)
Order to discharge pt home.  Three rounds of K+ ordered and infused prior to discharge.  Attempted to contact pts legal guardian - LVM.  AVS reviewed with pt.  Pt verbalized understanding and all questions answered.  Pt waiting for transport home.

## 2022-05-01 NOTE — Progress Notes (Signed)
[facility]  ROUNDING NOTE   Subjective:   Jonathan Mcmillan is a 26 year old black male with past medical conditions including gastroparesis, type 1 diabetes, hypertension, GERD, cannabinoid hyperemesis syndrome, anemia, paroxysmal atrial fibrillation, and end-stage renal disease on hemodialysis.  Patient presents to the emergency department with nausea, vomiting, and abdominal pain for 2 days.  Patient has been admitted under observation for Hypokalemia [E87.6] Prolonged Q-T interval on ECG [R94.31] Uncontrolled hypertension [I10] Intractable nausea and vomiting [R11.2] Cannabinoid hyperemesis syndrome [R11.2, F12.90]  Patient is known to our practice and receives outpatient dialysis treatments at [facility] on a MWF schedule, supervised by Dr. Candiss Norse.   Patient seen resting in bed, alert and oriented Denies nausea at this time No lower extremity edema   Objective:  Vital signs in last 24 hours:  Temp:  [97.3 F (36.3 C)-98 F (36.7 C)] 97.6 F (36.4 C) (12/19 1134) Pulse Rate:  [85-101] 91 (12/19 1134) Resp:  [12-20] 18 (12/19 1134) BP: (126-189)/(74-117) 146/99 (12/19 1134) SpO2:  [99 %-100 %] 100 % (12/19 1134) Weight:  [56.2 kg] 56.2 kg (12/19 0500)  Weight change: -0.568 kg Filed Weights   04/30/22 0932 04/30/22 1346 05/01/22 0500  Weight: 58.4 kg 56.2 kg 56.2 kg    Intake/Output: I/O last 3 completed shifts: In: -  Out: 2000 [Other:2000]   Intake/Output this shift:  Total I/O In: 6.4 [IV Piggyback:6.4] Out: -   Physical Exam: General: NAD  Head: Normocephalic, atraumatic.   Eyes: Anicteric  Lungs:  Clear to auscultation, normal effort, room air  Heart: Regular rate and rhythm  Abdomen:  Soft, nontender  Extremities: No peripheral edema.  Neurologic: Nonfocal, moving all four extremities  Skin: No lesions  Access: Right PermCath    Basic Metabolic Panel: Recent Labs  Lab 04/29/22 2116 04/30/22 0719 04/30/22 0947 05/01/22 0419   NA 141  --  142 140  K 2.8* 3.5 3.1* 2.9*  CL 97*  --  98 96*  CO2 29  --  31 28  GLUCOSE 158*  --  117* 103*  BUN 80*  --  86* 40*  CREATININE 8.71*  --  9.51* 6.39*  CALCIUM 8.0*  --  7.5* 8.5*  MG 2.5*  --   --   --   PHOS  --   --  7.0*  --      Liver Function Tests: Recent Labs  Lab 04/29/22 2116 04/30/22 0947  AST 21  --   ALT 35  --   ALKPHOS 120  --   BILITOT 1.2  --   PROT 7.7  --   ALBUMIN 4.0 3.5    Recent Labs  Lab 04/29/22 2116 04/30/22 0947  LIPASE 40 38    No results for input(s): "AMMONIA" in the last 168 hours.  CBC: Recent Labs  Lab 04/29/22 2116 05/01/22 0419  WBC 3.7* 3.0*  HGB 10.7* 12.6*  HCT 32.5* 38.1*  MCV 90.3 88.8  PLT 173 169     Cardiac Enzymes: No results for input(s): "CKTOTAL", "CKMB", "CKMBINDEX", "TROPONINI" in the last 168 hours.  BNP: Invalid input(s): "POCBNP"  CBG: Recent Labs  Lab 04/30/22 0746 04/30/22 1634 04/30/22 2236 05/01/22 0903 05/01/22 1135  GLUCAP 168* 89 99 106* 121*     Microbiology: Results for orders placed or performed during the hospital encounter of 08/24/21  Aerobic Culture w Gram Stain (superficial specimen)     Status: None   Collection Time: 08/24/21  1:00 PM   Specimen:  Wound  Result Value Ref Range Status   Specimen Description   Final    WOUND Performed at [facility], [address] [place], [place] [address]    Special Requests   Final    LEFT FOOT Performed at [facility], [address] [place], [place] [address]    Gram Stain   Final    MODERATE WBC PRESENT,BOTH PMN AND MONONUCLEAR ABUNDANT GRAM POSITIVE COCCI IN PAIRS ABUNDANT GRAM NEGATIVE RODS Performed at [facility], [place] [address], [place], [place] [address]    Culture   Final    MODERATE ESCHERICHIA COLI FEW STAPHYLOCOCCUS AUREUS    Report Status 08/28/2021 FINAL  Final   Organism ID, Bacteria ESCHERICHIA COLI  Final   Organism ID, Bacteria STAPHYLOCOCCUS AUREUS  Final       Susceptibility   Escherichia coli - MIC*    AMPICILLIN >=32 RESISTANT Resistant     CEFAZOLIN <=4 SENSITIVE Sensitive     CEFEPIME <=0.12 SENSITIVE Sensitive     CEFTAZIDIME <=1 SENSITIVE Sensitive     CEFTRIAXONE <=0.25 SENSITIVE Sensitive     CIPROFLOXACIN 1 RESISTANT Resistant     GENTAMICIN <=1 SENSITIVE Sensitive     IMIPENEM <=0.25 SENSITIVE Sensitive     TRIMETH/SULFA >=320 RESISTANT Resistant     AMPICILLIN/SULBACTAM 16 INTERMEDIATE Intermediate     PIP/TAZO <=4 SENSITIVE Sensitive     * MODERATE ESCHERICHIA COLI   Staphylococcus aureus - MIC*    CIPROFLOXACIN 2 INTERMEDIATE Intermediate     ERYTHROMYCIN <=0.25 SENSITIVE Sensitive     GENTAMICIN <=0.5 SENSITIVE Sensitive     OXACILLIN <=0.25 SENSITIVE Sensitive     TETRACYCLINE <=1 SENSITIVE Sensitive     VANCOMYCIN <=0.5 SENSITIVE Sensitive     TRIMETH/SULFA <=10 SENSITIVE Sensitive     CLINDAMYCIN <=0.25 SENSITIVE Sensitive     RIFAMPIN <=0.5 SENSITIVE Sensitive     Inducible Clindamycin NEGATIVE Sensitive     * FEW STAPHYLOCOCCUS AUREUS    Coagulation Studies: No results for input(s): "LABPROT", "INR" in the last 72 hours.  Urinalysis: No results for input(s): "COLORURINE", "LABSPEC", "PHURINE", "GLUCOSEU", "HGBUR", "BILIRUBINUR", "KETONESUR", "PROTEINUR", "UROBILINOGEN", "NITRITE", "LEUKOCYTESUR" in the last 72 hours.  Invalid input(s): "APPERANCEUR"    Imaging: CT HEAD WO CONTRAST (5MM)  Result Date: 04/30/2022 CLINICAL DATA:  Mental status change EXAM: CT HEAD WITHOUT CONTRAST TECHNIQUE: Contiguous axial images were obtained from the base of the skull through the vertex without intravenous contrast. RADIATION DOSE REDUCTION: This exam was performed according to the departmental dose-optimization program which includes automated exposure control, adjustment of the mA and/or kV according to patient size and/or use of iterative reconstruction technique. COMPARISON:  CT brain 06/03/2021 FINDINGS: Brain: No  acute territorial infarction, hemorrhage or intracranial mass. The ventricles are nonenlarged. Vascular: No hyperdense vessel or unexpected calcification. Skull: Normal. Negative for fracture or focal lesion. Sinuses/Orbits: No acute finding. Other: Scalp skin thickening and edema, not significantly changed. IMPRESSION: No CT evidence for acute intracranial abnormality. Electronically Signed   By: Donavan Foil M.D.   On: 04/30/2022 15:59   CT ABDOMEN PELVIS WO CONTRAST  Result Date: 04/30/2022 CLINICAL DATA:  Abdominal pain EXAM: CT ABDOMEN AND PELVIS WITHOUT CONTRAST TECHNIQUE: Multidetector CT imaging of the abdomen and pelvis was performed following the standard protocol without IV contrast. RADIATION DOSE REDUCTION: This exam was performed according to the departmental dose-optimization program which includes automated exposure control, adjustment of the mA and/or kV according to patient size and/or use of iterative  reconstruction technique. COMPARISON:  04/11/2022 FINDINGS: Lower chest: Lung bases are clear. Hepatobiliary: Unenhanced liver is unremarkable. Mild layering gallbladder sludge (series 2/image 30), without associated inflammatory changes. No intra right or extrahepatic duct dilatation. Pancreas: Within normal limits. Spleen: Within normal limits. Adrenals/Urinary Tract: Adrenal glands are within normal limits. Kidneys are within normal limits. No renal calculi or hydronephrosis. Thick-walled bladder, although underdistended, unchanged from prior. Stomach/Bowel: Stomach is within normal limits. No evidence of bowel obstruction. Normal appendix (series 2/image 50). No colonic wall thickening or inflammatory changes. Vascular/Lymphatic: No evidence of abdominal aortic aneurysm. No suspicious abdominopelvic lymphadenopathy. Reproductive: Prostate is unremarkable. Other: No abdominopelvic ascites. Musculoskeletal: Visualized osseous structures are within normal limits. Chronic cutaneous thickening  overlying the coccyx (series 2/image 9) and right gluteal region (series 2/image 89), likely related to prior ulcer/infection. No associated fluid collection/abscess. IMPRESSION: Mild bladder wall thickening, chronic, correlate for cystitis. Additional stable ancillary findings as above. No CT findings to account for the patient's abdominal pain. Electronically Signed   By: Julian Hy M.D.   On: 04/30/2022 00:19     Medications:    anticoagulant sodium citrate      calcitRIOL  0.5 mcg Oral Daily   calcium acetate  2,001 mg Oral TID with meals   carvedilol  3.125 mg Oral BID WC   Chlorhexidine Gluconate Cloth  6 each Topical Q0600   cinacalcet  30 mg Oral Q breakfast   feeding supplement  237 mL Oral BID BM   heparin  5,000 Units Subcutaneous Q8H   insulin aspart  0-5 Units Subcutaneous QHS   insulin aspart  0-9 Units Subcutaneous TID WC   multivitamin  1 tablet Oral QHS   nicotine  21 mg Transdermal Daily   pantoprazole  40 mg Oral Daily   sacubitril-valsartan  1 tablet Oral BID   acetaminophen, alteplase, anticoagulant sodium citrate, diphenhydrAMINE, heparin, hydrALAZINE, lidocaine (PF), lidocaine-prilocaine, morphine injection, oxyCODONE-acetaminophen, pentafluoroprop-tetrafluoroeth  Assessment/ Plan:  [name] is a 26 y.o.  male with past medical conditions including gastroparesis, type 1 diabetes, hypertension, GERD, cannabinoid hyperemesis syndrome, anemia, paroxysmal atrial fibrillation, and end-stage renal disease on hemodialysis.  Patient presents to the emergency department with nausea, vomiting, and abdominal pain for 2 days.  Patient has been admitted under observation for Hypokalemia [E87.6] Prolonged Q-T interval on ECG [R94.31] Uncontrolled hypertension [I10] Intractable nausea and vomiting [R11.2] Cannabinoid hyperemesis syndrome [R11.2, F12.90]  [facility]/MWF/right chest PermCath/57.5 kg  Hypokalemia with end-stage renal disease on  hemodialysis.  Potassium on ED arrival 2.8.  Potassium corrected during dialysis yesterday however reduced this morning, 2.9.  Patient ordered IV and oral supplementation by primary team.  Tolerated dialysis well yesterday.  Next treatment scheduled for Wednesday.  2. Anemia of chronic kidney disease Lab Results  Component Value Date   HGB 12.6 (L) 05/01/2022  Monitor hemoglobin for need of ESA's.  Hemoglobin currently acceptable.  3. Secondary Hyperparathyroidism: with outpatient labs: PTH 664, phosphorus 10.1, calcium 6.1 on 04/23/22.   Lab Results  Component Value Date   PTH 94 (H) 06/20/2021   CALCIUM 8.5 (L) 05/01/2022   CAION 1.32 05/10/2007   PHOS 7.0 (H) 04/30/2022    Patient receives calcitriol and calcium carbonate outpatient.  Calcium within acceptable range however phosphorus remains elevated.  Will continue to monitor bone minerals outpatient.  4.  Hypertension with chronic kidney disease.  Home regimen includes amiodarone, carvedilol, Entresto, and clonidine.  Currently receiving carvedilol and Entresto.  Blood pressure acceptable, 146/99.  LOS: 0   12/19/20232:36 PM

## 2022-08-12 ENCOUNTER — Emergency Department
Admission: EM | Admit: 2022-08-12 | Discharge: 2022-08-12 | Disposition: A | Discharge status: Home / Self Care | Payer: Medicare HMO | Attending: Emergency Medicine | Admitting: Emergency Medicine

## 2022-08-12 ENCOUNTER — Emergency Department: Payer: Medicare HMO

## 2022-08-12 ENCOUNTER — Other Ambulatory Visit: Payer: Self-pay

## 2022-08-12 DIAGNOSIS — R1010 Upper abdominal pain, unspecified: Secondary | ICD-10-CM | POA: Diagnosis present

## 2022-08-12 DIAGNOSIS — I509 Heart failure, unspecified: Secondary | ICD-10-CM | POA: Insufficient documentation

## 2022-08-12 DIAGNOSIS — E109 Type 1 diabetes mellitus without complications: Secondary | ICD-10-CM | POA: Insufficient documentation

## 2022-08-12 DIAGNOSIS — I110 Hypertensive heart disease with heart failure: Secondary | ICD-10-CM | POA: Diagnosis not present

## 2022-08-12 DIAGNOSIS — R112 Nausea with vomiting, unspecified: Secondary | ICD-10-CM | POA: Diagnosis not present

## 2022-08-12 DIAGNOSIS — F1210 Cannabis abuse, uncomplicated: Secondary | ICD-10-CM | POA: Insufficient documentation

## 2022-08-12 DIAGNOSIS — Z794 Long term (current) use of insulin: Secondary | ICD-10-CM | POA: Diagnosis not present

## 2022-08-12 DIAGNOSIS — F1290 Cannabis use, unspecified, uncomplicated: Secondary | ICD-10-CM

## 2022-08-12 DIAGNOSIS — N186 End stage renal disease: Secondary | ICD-10-CM | POA: Insufficient documentation

## 2022-08-12 LAB — COMPREHENSIVE METABOLIC PANEL
ALT: 11 U/L (ref 0–44)
AST: 12 U/L — ABNORMAL LOW (ref 15–41)
Albumin: 4.3 g/dL (ref 3.5–5.0)
Alkaline Phosphatase: 116 U/L (ref 38–126)
Anion gap: 15 (ref 5–15)
BUN: 33 mg/dL — ABNORMAL HIGH (ref 6–20)
CO2: 30 mmol/L (ref 22–32)
Calcium: 8.1 mg/dL — ABNORMAL LOW (ref 8.9–10.3)
Chloride: 90 mmol/L — ABNORMAL LOW (ref 98–111)
Creatinine, Ser: 9.21 mg/dL — ABNORMAL HIGH (ref 0.61–1.24)
GFR, Estimated: 7 mL/min — ABNORMAL LOW (ref >60)
Glucose, Bld: 136 mg/dL — ABNORMAL HIGH (ref 70–99)
Potassium: 2.9 mmol/L — ABNORMAL LOW (ref 3.5–5.1)
Sodium: 135 mmol/L (ref 135–145)
Total Bilirubin: 1.5 mg/dL — ABNORMAL HIGH (ref 0.3–1.2)
Total Protein: 7.8 g/dL (ref 6.5–8.1)

## 2022-08-12 LAB — ETHANOL: Alcohol, Ethyl (B): <10 mg/dL (ref <10)

## 2022-08-12 LAB — CBC
HCT: 36.7 % — ABNORMAL LOW (ref 39.0–52.0)
Hemoglobin: 12.2 g/dL — ABNORMAL LOW (ref 13.0–17.0)
MCH: 30.4 pg (ref 26.0–34.0)
MCHC: 33.2 g/dL (ref 30.0–36.0)
MCV: 91.5 fL (ref 80.0–100.0)
Platelets: 224 10*3/uL (ref 150–400)
RBC: 4.01 MIL/uL — ABNORMAL LOW (ref 4.22–5.81)
RDW: 13.6 % (ref 11.5–15.5)
WBC: 5.3 10*3/uL (ref 4.0–10.5)
nRBC: 0 % (ref 0.0–0.2)

## 2022-08-12 LAB — LIPASE, BLOOD: Lipase: 31 U/L (ref 11–51)

## 2022-08-12 MED ORDER — FAMOTIDINE IN NACL 20-0.9 MG/50ML-% IV SOLN
20.0000 mg | Freq: Once | INTRAVENOUS | Status: AC
  Administered 2022-08-12: 20 mg via INTRAVENOUS
  Filled 2022-08-12: qty 50

## 2022-08-12 MED ORDER — LORAZEPAM 2 MG/ML IJ SOLN
1.0000 mg | Freq: Once | INTRAMUSCULAR | Status: AC
  Administered 2022-08-12: 1 mg via INTRAVENOUS
  Filled 2022-08-12: qty 1

## 2022-08-12 MED ORDER — HYDROMORPHONE HCL 1 MG/ML IJ SOLN
0.5000 mg | Freq: Once | INTRAMUSCULAR | Status: AC
  Administered 2022-08-12: 0.5 mg via INTRAVENOUS
  Filled 2022-08-12: qty 0.5

## 2022-08-12 MED ORDER — MORPHINE SULFATE (PF) 4 MG/ML IV SOLN: 4.0000 mg | Freq: Once | INTRAVENOUS | Status: DC

## 2022-08-12 MED ORDER — DIPHENHYDRAMINE HCL 50 MG/ML IJ SOLN
25.0000 mg | Freq: Once | INTRAMUSCULAR | Status: AC
  Administered 2022-08-12: 25 mg via INTRAVENOUS
  Filled 2022-08-12: qty 1

## 2022-08-12 MED ORDER — SODIUM CHLORIDE 0.9 % IV BOLUS
500.0000 mL | Freq: Once | INTRAVENOUS | Status: AC
  Administered 2022-08-12: 500 mL via INTRAVENOUS

## 2022-08-12 MED ORDER — CAPSAICIN 0.025 % EX CREA
TOPICAL_CREAM | Freq: Once | CUTANEOUS | Status: AC
  Filled 2022-08-12: qty 60

## 2022-08-12 NOTE — ED Notes (Signed)
Pt reports that he has been able to tolerate fluids PO.

## 2022-08-12 NOTE — ED Notes (Signed)
Pt verbalizes understanding of discharge instructions. Opportunity for questioning and answers were provided. Pt discharged from ED to home with aunt.

## 2022-08-12 NOTE — ED Provider Notes (Signed)
[facility] Provider Note    Event Date/Time   First MD Initiated Contact with Patient 08/12/22 [phone]     (approximate)   History   Abdominal Pain   HPI  Jonathan Mcmillan is a 27 y.o. male who presents to the ED from home with a chief complaint of abdominal pain, nausea and vomiting.  Patient with a history of ESRD on HD M/W/F.  States he did receive full dialysis on Friday.  Symptoms x 1 week.  He was seen at [facility] ED on 08/10/2022 for same with reassuring workup including negative CT scan.  He does admit to daily heavy marijuana use.  He is diagnosed with cannabinoid hyperemesis syndrome and discharged home on Phenergan.  Patient denies fever/chills, chest pain, shortness of breath, dysuria.  Does not make urine.     Past Medical History   Past Medical History:  Diagnosis Date   Anemia of chronic renal failure    Anoxic encephalopathy (HCC)    Anxiety    Aortic atherosclerosis ([place])    Cannabinoid hyperemesis syndrome    Cardiac arrest ([place]) 06/01/2021   a.) PEA arrest in the setting of metabolic derangements; required intubation and CRRT.   Cardiomyopathy ([place]) 06/01/2021   a.) TTE 06/01/2021: EF 35-40%.   CHF (congestive heart failure) ([place])    a.) TTE 06/01/2021: EF 35-40%; global HK, mild LA enlargement; GLS -11.3%; ; mild MR/AR; G2DD. b.) TTE 06/05/2021: EF 35-40%; PASP 31.6 mmHg; mild MR, mild-mod TR.   COVID-19 05/22/2020   Depression    Diabetes 1.5, managed as type 1 ([place])    DKA (diabetic ketoacidoses) 05/21/2016   ESRD (end stage renal disease) on dialysis ([place])    Gastroparesis    GERD (gastroesophageal reflux disease)    History of 2019 novel coronavirus disease (COVID-19) 05/22/2021   HTN (hypertension)    Intractable nausea and vomiting 04/30/2022   Marijuana use    Nicotine dependence    PAF (paroxysmal atrial fibrillation) (HCC)    a.) CHA2DS2-VASc = 4 (CHF, HTN, aortic plaque, T1DM). b.) rate/rhythm maintained on oral carvedilol  + amiodarone; no chronic anticoagulation due to anemia and thrombocytopenia   Perirectal abscess 06/16/2017   Right arm cellulitis 01/26/2018   Thrombocytopenia ([place])      Active Problem List   Patient Active Problem List   Diagnosis Date Noted   Gastroenteritis 05/01/2022   Intractable nausea and vomiting 04/30/2022   Hypokalemia 04/30/2022   QT prolongation 04/30/2022   Hypertensive urgency 04/30/2022   Depression with anxiety 04/30/2022   Gastroparesis 04/30/2022   Anemia in ESRD (end-stage renal disease) ([place]) 04/30/2022   Cannabinoid hyperemesis syndrome 04/30/2022   Protein-calorie malnutrition, moderate ([place]) [id number]   Acute metabolic encephalopathy [id number]   Anoxic encephalopathy ([place]) 06/23/2021   ESRD on dialysis ([place]) 06/22/2021   Debility 06/22/2021   Aspiration pneumonia ([place]) [id number]   Complication associated with dialysis catheter    Paroxysmal atrial fibrillation ([place]) 06/14/2021   Chronic combined systolic and diastolic CHF (congestive heart failure) ([place]) 06/14/2021   Elevated troponin 06/14/2021   Hypocalcemia 06/14/2021   Pressure injury of buttock, stage 2 ([place]) 06/14/2021   ESRD (end stage renal disease) ([place])    Thrombocytopenia ([place])    Type 1 diabetes mellitus with complication ([place]) [id number]   Nicotine dependence 05/31/2021   Polysubstance abuse ([place])    Malnutrition of moderate degree 05/22/2016   Hypertension 10/05/2010     Past Surgical History   Past Surgical History:  Procedure  Laterality Date   AMPUTATION TOE Right 08/25/2020   partial 5th toe   AV FISTULA PLACEMENT Left 09/26/2021   Procedure: INSERTION OF ARTERIOVENOUS (AV) GORE-TEX GRAFT ARM (BRACHIAL AXILLARY );  Surgeon: Algernon Huxley, MD;  Location: [facility] ORS;  Service: Vascular;  Laterality: Left;   COLONOSCOPY WITH PROPOFOL N/A 06/21/2021   Procedure: COLONOSCOPY WITH PROPOFOL;  Surgeon: Lin Landsman, MD;  Location: [facility] ENDOSCOPY;  Service: Gastroenterology;   Laterality: N/A;   DEBRIDEMENT  FOOT Right 11/01/2019   wound   DIALYSIS/PERMA CATHETER INSERTION N/A 06/15/2021   Procedure: DIALYSIS/PERMA CATHETER INSERTION;  Surgeon: Algernon Huxley, MD;  Location: [place] [facility];  Service: Cardiovascular;  Laterality: N/A;   DIALYSIS/PERMA CATHETER INSERTION N/A 06/19/2021   Procedure: DIALYSIS/PERMA CATHETER INSERTION;  Surgeon: Algernon Huxley, MD;  Location: [place] [facility];  Service: Cardiovascular;  Laterality: N/A;   DIALYSIS/PERMA CATHETER REMOVAL N/A 11/21/2021   Procedure: DIALYSIS/PERMA CATHETER REMOVAL;  Surgeon: Katha Cabal, MD;  Location: [place] [facility];  Service: Cardiovascular;  Laterality: N/A;   INCISION AND DRAINAGE PERIRECTAL ABSCESS N/A 06/16/2017   Procedure: IRRIGATION AND DEBRIDEMENT PERIRECTAL ABSCESS;  Surgeon: Florene Glen, MD;  Location: [facility] ORS;  Service: General;  Laterality: N/A;   none       Home Medications   Prior to Admission medications   Medication Sig Start Date End Date Taking? Authorizing Provider  Accu-Chek Softclix Lancets lancets Use aas directed up to 4 times per day. 06/29/21   Kirsteins, Luanna Salk, MD  acetaminophen (TYLENOL) 325 MG tablet Take 1-2 tablets (325-650 mg total) by mouth every 4 (four) hours as needed for mild pain. Patient not taking: Reported on 04/30/2022 06/28/21   Barbie Banner, PA-C  Blood Glucose Monitoring Suppl (BLOOD GLUCOSE MONITOR SYSTEM) w/Device KIT Use as directed up to 4 times per day. 06/29/21   Setzer, Edman Circle, PA-C  calcitRIOL (ROCALTROL) 0.5 MCG capsule Take 1 capsule (0.5 mcg total) by mouth daily. Patient not taking: Reported on 04/30/2022 06/28/21   Barbie Banner, PA-C  calcium acetate (PHOSLO) 667 MG capsule Take 2,001 mg by mouth 3 (three) times daily. Patient not taking: Reported on 04/30/2022 02/21/22   [provider]  calcium carbonate (TUMS - DOSED IN MG ELEMENTAL CALCIUM) 500 MG chewable tablet Chew 1 tablet (200 mg of elemental  calcium total) by mouth 3 (three) times daily. Patient not taking: Reported on 04/30/2022 06/28/21   Vaughan Basta, Edman Circle, PA-C  carvedilol (COREG) 3.125 MG tablet Take 1 tablet (3.125 mg total) by mouth 2 (two) times daily with a meal. Patient not taking: Reported on 04/30/2022 06/28/21   Vaughan Basta, Edman Circle, PA-C  cinacalcet (SENSIPAR) 30 MG tablet Take 1 tablet (30 mg total) by mouth daily with breakfast. Patient not taking: Reported on 04/30/2022 06/29/21   Barbie Banner, PA-C  collagenase (SANTYL) ointment Apply topically daily. Patient not taking: Reported on 04/30/2022 06/29/21   Vaughan Basta, Edman Circle, PA-C  Darbepoetin Alfa (ARANESP) 60 MCG/0.3ML SOSY injection Inject 0.3 mLs (60 mcg total) into the vein every Friday with hemodialysis. Patient not taking: Reported on 04/30/2022 06/30/21   Barbie Banner, PA-C  ferrous sulfate 325 (65 FE) MG tablet Take 1 tablet (325 mg total) by mouth daily with breakfast. Patient not taking: Reported on 04/30/2022 06/28/21   Barbie Banner, PA-C  glucose blood test strip Use as directed 06/29/21   Kirsteins, Luanna Salk, MD  hydrocerin (EUCERIN) CREA Apply 1 application topically 2 (  two) times daily. Patient not taking: Reported on 04/30/2022 06/28/21   Barbie Banner, PA-C  insulin aspart (NOVOLOG) 100 UNIT/ML injection Inject 0-15 Units into the skin 3 (three) times daily with meals. For blood glucose to 70-150 give 0 units; 151 to 200 give 1 unit; 201 to 250 give 2 units, 251-300 give 3 units. 301 or greater, call primary care physician.. Patient not taking: Reported on 04/30/2022 06/28/21   Barbie Banner, PA-C  Insulin Pen Needle 32G X 4 MM MISC Use as directed 06/29/21   Kirsteins, Luanna Salk, MD  lidocaine-prilocaine (EMLA) cream Apply 1 Application topically once. Patient not taking: Reported on 04/30/2022 11/20/21   [provider]  lipase/protease/amylase (CREON) 36000 UNITS CPEP capsule Take 1 capsule (36,000 Units total) by mouth 3 (three) times  daily with meals. Patient not taking: Reported on 04/30/2022 06/28/21   Barbie Banner, PA-C  loperamide (IMODIUM) 2 MG capsule Take 1 capsule (2 mg total) by mouth 2 (two) times daily. Patient not taking: Reported on 04/30/2022 06/28/21   Barbie Banner, PA-C  Nutritional Supplements (,FEEDING SUPPLEMENT, PROSOURCE PLUS) liquid Take 30 mLs by mouth 3 (three) times daily between meals. 06/28/21   Setzer, Edman Circle, PA-C  omeprazole (PRILOSEC) 40 MG capsule Take 1 capsule (40 mg total) by mouth daily. 06/28/21 08/27/21  Setzer, Edman Circle, PA-C  sacubitril-valsartan (ENTRESTO) 49-51 MG Take 1 tablet by mouth 2 (two) times daily. Patient not taking: Reported on 04/30/2022 09/05/21   Kate Sable, MD  sucralfate (CARAFATE) 1 g tablet Take 1 tablet (1 g total) by mouth 4 (four) times daily. Patient not taking: Reported on 06/26/2019 02/04/19 01/02/20  Nance Pear, MD     Allergies  Patient has no known allergies.   Family History   Family History  Problem Relation Age of Onset   Diabetes Mother      Physical Exam  Triage Vital Signs: ED Triage Vitals  Enc Vitals Group     BP 08/12/22 0600 102/80     Pulse Rate 08/12/22 0600 89     Resp 08/12/22 0600 18     Temp 08/12/22 0600 98.3 F (36.8 C)     Temp Source 08/12/22 0600 Oral     SpO2 08/12/22 0600 100 %     Weight 08/12/22 0600 130 lb (59 kg)     Height 08/12/22 0600 5\' 8"  (1.727 m)     Head Circumference --      Peak Flow --      Pain Score 08/12/22 0601 9     Pain Loc --      Pain Edu? --      Excl. in [place]? --     Updated Vital Signs: BP 102/80 (BP Location: Left Arm)   Pulse 89   Temp 98.3 F (36.8 C) (Oral)   Resp 18   Ht 5\' 8"  (1.727 m)   Wt 59 kg   SpO2 100%   BMI 19.77 kg/m    General: Awake, no distress.  Strong smell of marijuana about him. CV:  RRR.  Good peripheral perfusion.  Resp:  Normal effort.  CTAB. Abd:  Mild epigastric tenderness to palpation without rebound or guarding.  No distention.   Other:  No truncal vesicles.   ED Results / Procedures / Treatments  Labs (all labs ordered are listed, but only abnormal results are displayed) Labs Reviewed  CBC - Abnormal; Notable for the following components:      Result Value  RBC 4.01 (*)    Hemoglobin 12.2 (*)    HCT 36.7 (*)    All other components within normal limits  COMPREHENSIVE METABOLIC PANEL - Abnormal; Notable for the following components:   Potassium 2.9 (*)    Chloride 90 (*)    Glucose, Bld 136 (*)    BUN 33 (*)    Creatinine, Ser 9.21 (*)    Calcium 8.1 (*)    AST 12 (*)    Total Bilirubin 1.5 (*)    GFR, Estimated 7 (*)    All other components within normal limits  LIPASE, BLOOD  ETHANOL     EKG  ED ECG REPORT I, Ikran Patman J, the attending physician, personally viewed and interpreted this ECG.   Date: 08/12/2022  EKG Time: 0609  Rate: 83  Rhythm: normal sinus rhythm  Axis: Normal  Intervals:nonspecific intraventricular conduction delay  ST&T Change: Peak T waves noted QTc 522   RADIOLOGY I have independently visualized and interpreted patient's x-ray and ultrasound as well as noted the radiology interpretation:  Chest x-ray: No acute cardiopulmonary process  Ultrasound: Pending  Official radiology report(s): DG Chest Port 1 View  Result Date: 08/12/2022 CLINICAL DATA:  Vomiting EXAM: PORTABLE CHEST 1 VIEW COMPARISON:  06/08/2021 FINDINGS: Normal heart size and mediastinal contours. No acute infiltrate or edema. No effusion or pneumothorax. No acute osseous findings. Remote right eighth rib fracture.  Left upper extremity stent. IMPRESSION: No evidence of active disease. Electronically Signed   By: Jorje Guild M.D.   On: 08/12/2022 06:46     PROCEDURES:  Critical Care performed: No  .1-3 Lead EKG Interpretation  Performed by: Paulette Blanch, MD Authorized by: Paulette Blanch, MD     Interpretation: normal     ECG rate:  90   ECG rate assessment: normal     Rhythm: sinus  rhythm     Ectopy: none     Conduction: normal   Comments:     Patient placed on cardiac monitor to evaluate for arrhythmias    MEDICATIONS ORDERED IN ED: Medications  famotidine (PEPCID) IVPB 20 mg premix (20 mg Intravenous New Bag/Given 08/12/22 [id number])  sodium chloride 0.9 % bolus 500 mL (500 mLs Intravenous New Bag/Given 08/12/22 0638)  capsaicin (ZOSTRIX) 0.025 % cream ( Topical Given 08/12/22 0655)  diphenhydrAMINE (BENADRYL) injection 25 mg (25 mg Intravenous Given 08/12/22 [id number])     IMPRESSION / MDM / ASSESSMENT AND PLAN / ED COURSE  I reviewed the triage vital signs and the nursing notes.                             27 year old male presenting with abdominal pain, nausea and vomiting. Differential diagnosis includes, but is not limited to, biliary disease (biliary colic, acute cholecystitis, cholangitis, choledocholithiasis, etc), intrathoracic causes for epigastric abdominal pain including ACS, gastritis, duodenitis, pancreatitis, small bowel or large bowel obstruction, abdominal aortic aneurysm, hernia, and ulcer(s).  I have personally reviewed patient's records and note his [facility] ED visit from 08/10/2022.  I did note on CT report patient has biliary ductal dilation.  Patient's presentation is most consistent with acute presentation with potential threat to life or bodily function.  The patient is on the cardiac monitor to evaluate for evidence of arrhythmia and/or significant heart rate changes.  Will obtain lab work, chest x-ray.  Given findings of dilated biliary ducts on CT scan at [facility], will obtain right upper quadrant abdominal ultrasound.  Administer judicious fluids.  Patient has prolonged QTc, will administer IV Benadryl for nausea, add IV Pepcid and capsaicin cream for likely cannabinoid hyperemesis.  Clinical Course as of 08/12/22 0701  Nancy Fetter Aug 12, 2022  0701 Laboratory results demonstrate normal WBC 5.9, potassium 2.9 in the setting of renal dialysis patient.  Chest x-ray  is clear.  Care is transferred to oncoming provider at change of shift pending ultrasound, reassessment and PO challenge. [JS]    Clinical Course User Index [JS] Paulette Blanch, MD     FINAL CLINICAL IMPRESSION(S) / ED DIAGNOSES   Final diagnoses:  Pain of upper abdomen  Nausea and vomiting, unspecified vomiting type  Cannabinoid hyperemesis syndrome     Rx / DC Orders   ED Discharge Orders     None        Note:  This document was prepared using Dragon voice recognition software and may include unintentional dictation errors.   Paulette Blanch, MD 08/12/22 [phone]

## 2022-08-12 NOTE — ED Provider Notes (Signed)
-----------------------------------------   1:02 PM on 08/12/2022 -----------------------------------------  I took over care of this patient from Dr. Beather Arbour.  Ultrasound shows a slightly dilated CBD similar to the finding seen on the CT from [facility] but no gallstones or other acute findings.  The patient's labs do not reflect any evidence of a biliary obstruction.   On my initial reassessment a few hours ago he was complaining of some continued upper abdominal pain although not specifically in the right upper quadrant.  This improved with analgesia.  The patient then had some persistent nausea.  Given his prolonged QTc I wanted to avoid Reglan or Zofran.  We gave a small dose of Ativan which improved the nausea and he is now tolerating p.o.  He feels comfortable going home.  At this time, there is no evidence of acute intra-abdominal process.  Presentation is still consistent with cannabinoid hyperemesis or possible gastroparesis.  He is stable for discharge home.  I counseled him on the results of the workup.  I gave him strict return precautions and he expressed understanding.     Arta Silence, MD 08/12/22 [phone]

## 2022-08-12 NOTE — ED Triage Notes (Signed)
Upper abd pain with mild n/v. Denies diarrhea. Reports seen at [facility] yesterday and discharged. Reports pain onset on Friday and states has continued. Pt seemingly under influence of marijuana in triage with moments of spacing out and strong marijuana odor. Pt ambulatory to triage. Alert and oriented. Breathing unlabored speaking in full sentences. Pt receives dialysis 3 x/wk- MWF and denies missing any sessions.

## 2022-08-12 NOTE — Discharge Instructions (Signed)
You may continue to take the nausea vomiting that you are prescribed at your last ED visit at [facility].  Return to the ER for new, worsening, or persistent severe nausea or vomiting, abdominal pain, fever, weakness, or any other new or worsening symptoms that concern you.

## 2022-08-23 ENCOUNTER — Emergency Department
Admission: EM | Admit: 2022-08-23 | Discharge: 2022-08-23 | Disposition: A | Discharge status: Home / Self Care | Payer: Medicare HMO | Attending: Emergency Medicine | Admitting: Emergency Medicine

## 2022-08-23 ENCOUNTER — Emergency Department: Payer: Medicare HMO

## 2022-08-23 ENCOUNTER — Other Ambulatory Visit: Payer: Self-pay

## 2022-08-23 DIAGNOSIS — R1011 Right upper quadrant pain: Secondary | ICD-10-CM | POA: Diagnosis present

## 2022-08-23 DIAGNOSIS — Z992 Dependence on renal dialysis: Secondary | ICD-10-CM | POA: Insufficient documentation

## 2022-08-23 DIAGNOSIS — E1122 Type 2 diabetes mellitus with diabetic chronic kidney disease: Secondary | ICD-10-CM | POA: Insufficient documentation

## 2022-08-23 DIAGNOSIS — R110 Nausea: Secondary | ICD-10-CM | POA: Diagnosis not present

## 2022-08-23 DIAGNOSIS — Z79899 Other long term (current) drug therapy: Secondary | ICD-10-CM | POA: Diagnosis not present

## 2022-08-23 DIAGNOSIS — E876 Hypokalemia: Secondary | ICD-10-CM | POA: Insufficient documentation

## 2022-08-23 DIAGNOSIS — N186 End stage renal disease: Secondary | ICD-10-CM | POA: Diagnosis not present

## 2022-08-23 DIAGNOSIS — I509 Heart failure, unspecified: Secondary | ICD-10-CM | POA: Diagnosis not present

## 2022-08-23 DIAGNOSIS — I132 Hypertensive heart and chronic kidney disease with heart failure and with stage 5 chronic kidney disease, or end stage renal disease: Secondary | ICD-10-CM | POA: Insufficient documentation

## 2022-08-23 LAB — COMPREHENSIVE METABOLIC PANEL
ALT: 16 U/L (ref 0–44)
AST: 13 U/L — ABNORMAL LOW (ref 15–41)
Albumin: 3.9 g/dL (ref 3.5–5.0)
Alkaline Phosphatase: 100 U/L (ref 38–126)
Anion gap: 13 (ref 5–15)
BUN: 34 mg/dL — ABNORMAL HIGH (ref 6–20)
CO2: 28 mmol/L (ref 22–32)
Calcium: 8 mg/dL — ABNORMAL LOW (ref 8.9–10.3)
Chloride: 97 mmol/L — ABNORMAL LOW (ref 98–111)
Creatinine, Ser: 9.06 mg/dL — ABNORMAL HIGH (ref 0.61–1.24)
GFR, Estimated: 7 mL/min — ABNORMAL LOW (ref >60)
Glucose, Bld: 193 mg/dL — ABNORMAL HIGH (ref 70–99)
Potassium: 3 mmol/L — ABNORMAL LOW (ref 3.5–5.1)
Sodium: 138 mmol/L (ref 135–145)
Total Bilirubin: 0.7 mg/dL (ref 0.3–1.2)
Total Protein: 7.2 g/dL (ref 6.5–8.1)

## 2022-08-23 LAB — CBC
HCT: 35.9 % — ABNORMAL LOW (ref 39.0–52.0)
Hemoglobin: 11.6 g/dL — ABNORMAL LOW (ref 13.0–17.0)
MCH: 30.2 pg (ref 26.0–34.0)
MCHC: 32.3 g/dL (ref 30.0–36.0)
MCV: 93.5 fL (ref 80.0–100.0)
Platelets: 202 10*3/uL (ref 150–400)
RBC: 3.84 MIL/uL — ABNORMAL LOW (ref 4.22–5.81)
RDW: 13.3 % (ref 11.5–15.5)
WBC: 4.5 10*3/uL (ref 4.0–10.5)
nRBC: 0 % (ref 0.0–0.2)

## 2022-08-23 LAB — LIPASE, BLOOD: Lipase: 30 U/L (ref 11–51)

## 2022-08-23 MED ORDER — OMEPRAZOLE 40 MG PO CPDR: 40.0000 mg | DELAYED_RELEASE_CAPSULE | Freq: Every day | ORAL | 0 refills | Status: DC

## 2022-08-23 MED ORDER — FAMOTIDINE 20 MG PO TABS
20.0000 mg | ORAL_TABLET | Freq: Once | ORAL | Status: DC
  Filled 2022-08-23: qty 1

## 2022-08-23 MED ORDER — ACETAMINOPHEN 500 MG PO TABS
1000.0000 mg | ORAL_TABLET | Freq: Once | ORAL | Status: DC
  Filled 2022-08-23: qty 2

## 2022-08-23 MED ORDER — DIPHENHYDRAMINE HCL 50 MG/ML IJ SOLN
25.0000 mg | Freq: Once | INTRAMUSCULAR | Status: AC
  Administered 2022-08-23: 25 mg via INTRAVENOUS
  Filled 2022-08-23: qty 1

## 2022-08-23 MED ORDER — LORAZEPAM 2 MG/ML IJ SOLN
1.0000 mg | Freq: Once | INTRAMUSCULAR | Status: AC
  Administered 2022-08-23: 1 mg via INTRAVENOUS
  Filled 2022-08-23: qty 1

## 2022-08-23 NOTE — ED Triage Notes (Signed)
Pt to ED for generalized abd pain since Saturday. Last dialysis yesterday.  +nausea.  States was told to stop using marijuana and last use Saturday  States his legal guardian is aware of visit

## 2022-08-23 NOTE — Discharge Instructions (Addendum)
Take omeprazole as prescribed.  I made a referral to a gastroenterologist to further evaluate your abdominal pain.  It is important that you continue to stop using marijuana products for at least 1 year in case this may contribute to your symptoms.  See your doctor this week for recheck.  Thank you for choosing [place] for your health care today!  Please see your primary doctor this week for a follow up appointment.   Sometimes, in the early stages of certain disease courses it is difficult to detect in the emergency department evaluation -- so, it is important that you continue to monitor your symptoms and call your doctor right away or return to the emergency department if you develop any new or worsening symptoms.  Please go to the following website to schedule new (and existing) patient appointments:   [email or web address]  If you do not have a primary doctor try calling the following clinics to establish care:  If you have insurance:  [facility] [phone] [address] [place]., [place] [address]   [facility]  [phone] [address] [place] [place] [address]   If you do not have insurance:  [facility]  [phone] [address], [place] [address]   The following is another list of primary care offices in the area who are accepting new patients at this time.  Please reach out to one of them directly and let them know you would like to schedule an appointment to follow up on an Emergency Department visit, and/or to establish a new primary care provider (PCP).  There are likely other primary care clinics in the are who are accepting new patients, but this is an excellent place to start:  [facility] Lead physician: Dr Shirlee Latch [address] [place], [place] [address] [phone]  [facility] Lead Physician: Dr Alba Cory [address],  [place], [place] [address] [phone]  [facility]  Lead Physician: Dr Olevia Perches [address] [place], [place] [address] [phone]  [facility] Lead Physician: Dr Sofie Hartigan [address] [place], [place], [place] [address] [phone]  [facility] Lead Physician: Dr Bari Edward [address] [place], [place] [address] [phone]   It was my pleasure to care for you today.   Daneil Dan Modesto Charon, MD

## 2022-08-23 NOTE — ED Provider Notes (Signed)
[facility] Provider Note    Event Date/Time   First MD Initiated Contact with Patient 08/23/22 [phone]     (approximate)   History   Abdominal Pain   HPI  Jonathan Mcmillan is a 27 y.o. male   Past medical history of end-stage renal on dialysis Monday Wednesday Friday, cardiomyopathy, CHF, cannabinoid hyperemesis, diabetes, gastroparesis, DKA, paroxysmal atrial fibrillation not on anticoagulation due to thrombocytopenia/anemia who presents to the emergency department with right upper abdominal discomfort.  Ongoing for the past 1 week.  Marijuana use heavily and only stopped last week after coming to the emergency department with abdominal pain with negative workup.  Very minimal urine due to his end-stage renal disease, no urinary symptoms.  Nausea but no vomiting.  No diarrhea or GI bleeding.  Fully compliant with his dialysis treatment last got dialyzed yesterday.  Denies pain or respiratory symptoms.   External Medical Documents Reviewed: Emergency department visit dated August 12, 2022 for abdominal pain, upper, with prolonged QTc, dilated CBD on ultrasound but no gallstones or other acute findings and was discharged.      Physical Exam   Triage Vital Signs: ED Triage Vitals  Enc Vitals Group     BP 08/23/22 0716 (!) 140/100     Pulse Rate 08/23/22 0716 (!) 108     Resp 08/23/22 0716 18     Temp 08/23/22 0716 98.1 F (36.7 C)     Temp src --      SpO2 08/23/22 0716 100 %     Weight 08/23/22 0717 135 lb (61.2 kg)     Height 08/23/22 0717 5\' 8"  (1.727 m)     Head Circumference --      Peak Flow --      Pain Score 08/23/22 0717 9     Pain Loc --      Pain Edu? --      Excl. in GC? --     Most recent vital signs: Vitals:   08/23/22 0840 08/23/22 0845  BP: (!) 131/115   Pulse: 91 89  Resp: (!) 28 20  Temp:    SpO2: 100% 100%    General: Awake, no distress.  CV:  Good peripheral perfusion.  Resp:  Normal effort.  Abd:  No  distention.  Other:  Awake alert nontoxic slightly hypertensive 140/100 afebrile resting comfortably in the stretcher.  Mild right upper quadrant tenderness to palpation without rigidity or guarding.   ED Results / Procedures / Treatments   Labs (all labs ordered are listed, but only abnormal results are displayed) Labs Reviewed  COMPREHENSIVE METABOLIC PANEL - Abnormal; Notable for the following components:      Result Value   Potassium 3.0 (*)    Chloride 97 (*)    Glucose, Bld 193 (*)    BUN 34 (*)    Creatinine, Ser 9.06 (*)    Calcium 8.0 (*)    AST 13 (*)    GFR, Estimated 7 (*)    All other components within normal limits  CBC - Abnormal; Notable for the following components:   RBC 3.84 (*)    Hemoglobin 11.6 (*)    HCT 35.9 (*)    All other components within normal limits  LIPASE, BLOOD  URINALYSIS, ROUTINE W REFLEX MICROSCOPIC     I ordered and reviewed the above labs they are notable for hypokalemia at 3.0, similar to prior slightly improved, normal white blood cell count.  EKG  ED ECG  REPORT I, Pilar Jarvis, the attending physician, personally viewed and interpreted this ECG.   Date: 08/23/2022  EKG Time: 0734  Rate: 94  Rhythm: nsr  Axis: nl  Intervals: QTC 502  ST&T Change: No acute ischemic changes    RADIOLOGY I independently reviewed and interpreted right upper quadrant ultrasound see no obvious gallstones or wall thickening of the gallbladder   PROCEDURES:  Critical Care performed: No  Procedures   MEDICATIONS ORDERED IN ED: Medications  diphenhydrAMINE (BENADRYL) injection 25 mg (25 mg Intravenous Given 08/23/22 0837)  LORazepam (ATIVAN) injection 1 mg (1 mg Intravenous Given 08/23/22 0837)    IMPRESSION / MDM / ASSESSMENT AND PLAN / ED COURSE  I reviewed the triage vital signs and the nursing notes.                                Patient's presentation is most consistent with acute presentation with potential threat to life or  bodily function.  Differential diagnosis includes, but is not limited to, cholecystitis, choledocholithiasis, cannabinoid hyperemesis, gastroparesis   The patient is on the cardiac monitor to evaluate for evidence of arrhythmia and/or significant heart rate changes.  MDM: This is a patient with marijuana use history of hyper emesis, end-stage renal with right upper quadrant pain nausea but no vomiting.  Electrolytes chronically deranged and slight hypokalemia and a QTc prolongation.  Will check right upper quadrant ultrasound to rule out surgical pathology like cholecystitis or choledocholithiasis.  Check basic labs including LFTs and lipase.  Appears euvolemic will avoid IV hydration given his end-stage renal disease/dialysis.  Given QTc prolongation will avoid QTc prolongation meds and treat his nausea with Ativan/Benadryl which appeared to work well for him last emergency department visit.  I advised the patient to stop using marijuana and that the effects may take months of cessation prior to seeing improvement if this is indeed related to his cannabis use.   I considered hospitalization for admission or observation however given negative evaluation as above, normal labs and normal right upper quadrant ultrasound and patient has been stable I think discharge at this time with outpatient monitoring and follow-up is most appropriate.  I made a referral to GI.  I gave him strict return precautions.        FINAL CLINICAL IMPRESSION(S) / ED DIAGNOSES   Final diagnoses:  RUQ abdominal pain     Rx / DC Orders   ED Discharge Orders          Ordered    omeprazole (PRILOSEC) 40 MG capsule  Daily        08/23/22 0936    Ambulatory referral to Gastroenterology        08/23/22 0936             Note:  This document was prepared using Dragon voice recognition software and may include unintentional dictation errors.    Pilar Jarvis, MD 08/23/22 [phone]

## 2022-09-17 LAB — FUNGUS CULTURE, BLOOD
Culture: NO GROWTH
Special Requests: ADEQUATE

## 2023-09-27 NOTE — Anesthesia Postprocedure Evaluation (Signed)
 Patient: Jonathan Mcmillan  Scheduled: Procedure(s) with comments: RIGHT UPPER EXTREMITY ARTERIOVENOUS FISTULA CREATION AND BRANCH LIGATION - ** DOC  Anesthesia Start Date/Time: 09/26/23 1554   Procedure: RIGHT UPPER EXTREMITY ARTERIOVENOUS FISTULA CREATION AND  BRANCH LIGATION (Right: Arm Upper) - ** DOC   Anesthesia type: MAC, regional, general   Diagnosis: ESRD (end stage renal disease)  [N18.6]   Pre-op  diagnosis: END STAGE RENAL DISEASE   [facility]: [facility]   Surgeons: Daris Richerd Seta, MD     Final Anesthesia Type: MAC, regional, general  Anesthesia Staff: Anesthesiologist: Kittelberger, Francis Mt, MD Student Nurse Anesthetist: Bethena Savant, [place] Anesthesia Provider: Cil, Olivia K, CRNA; Rolinda Arlo Pinal, CRNA     No lines, drains, or airways are recorded for this episode.     Patient Location:   Last vitals:  Vitals Value Taken Time  BP    Temp    Pulse 80 09/26/23 1817  Resp    SpO2 98 % 09/26/23 1817  SpO2 Pulse 80 09/26/23 1817  Vitals shown include unfiled device data.    Level of consciousness: alert and awake Post vitals: stable PONV Present? no Pain score: 0 Pain management: adequate Airway patency: patent Cardiovascular status: stable and acceptable Respiratory status: acceptable, nonlabored ventilation and spontaneous ventilation Hydration status: acceptable  PACU PONV Meds: None  PACU Pain Meds: None  Anesthetic complications: There were no known notable events for this encounter.   _______________ Francis SHAUNNA Borne, MD 09/27/23

## 2023-10-24 NOTE — ED Provider Notes (Signed)
 [facility] EMER[facility]ENCY DEPARTMENT   CHIEF COMPLAINT   Chief Complaint  Patient presents with  . Abdominal Pain    HPI   History of Present Illness Jonathan Mcmillan is a 28 year old male who presents with severe right-sided abdominal pain and chills.  He has been experiencing severe right-sided abdominal pain for the past two to three days, which is exacerbated by movement and deep breathing. The pain sometimes radiates from his abdomen to his leg, causing significant discomfort.  He reports chills but denies fever. He also mentions headaches and numbness in his hands. No difficulty breathing deeply due to pain.  He has not been able to urinate properly and has a catheter in his right chest for dialysis access.     PCP: [facility]   PAST MEDICAL HISTORY   Past Medical History[1]  SURGICAL HISTORY   Past Surgical History[2]  CURRENT MEDICATIONS  Current Facility-Administered Medications:  .  piperacillin -tazobactam (ZOSYN ) FOR IV PUSH 4.5 g, 4.5 g, Intravenous, Once, Baruch Bernardino Mungo, MD  Current Outpatient Medications:  .  acetaminophen  (TYLENOL ) 325 MG tablet, Take 2 tablets (650 mg total) by mouth every Monday, Wednesday, and Friday., Disp: , Rfl:  .  amino acids/protein hydrolys (PRO-STAT 64 ORAL), Take 10 mL by mouth 3 (three) times a week., Disp: , Rfl:  .  amlodipine  (NORVASC ) 10 MG tablet, Take 1 tablet (10 mg total) by mouth every Monday, Wednesday, and Friday., Disp: , Rfl:  .  aspirin (ECOTRIN) 81 MG tablet, Take 1 tablet (81 mg total) by mouth daily., Disp: , Rfl:  .  calcium  acetate,phosphat bind, (PHOSLO ) 667 mg capsule, Take 4 capsules (2,668 mg total) by mouth Three (3) times a day with a meal., Disp: , Rfl:  .  carvedilol  (COREG ) 25 MG tablet, Take 1 tablet (25 mg total) by mouth two (2) times a day., Disp: , Rfl:  .  cloNIDine HCL (CATAPRES) 0.2 MG tablet, Take 1 tablet (0.2 mg total) by mouth four (4) times a day as needed. SBP >160, Disp: ,  Rfl:  .  cloNIDine HCL (CATAPRES) 0.2 MG tablet, Take 1 tablet (0.2 mg total) by mouth Three (3) times a day., Disp: , Rfl:  .  epoetin  alfa (EPOGEN ,PROCRIT ) 20,000 unit/mL injection, Inject 0.41 mL (8,200 Units total) under the skin 3 (three) times a week., Disp: , Rfl:  .  hydrALAZINE  (APRESOLINE ) 100 MG tablet, Take 1 tablet (100 mg total) by mouth two (2) times a day., Disp: , Rfl:  .  insulin  glargine (LANTUS ) 100 unit/mL injection, Inject 0.12 mL (12 Units total) under the skin nightly., Disp: , Rfl:  .  insulin  regular (HUMULIN,NOVOLIN) 100 unit/mL injection, Inject 0.04 mL (4 Units total) under the skin Three (3) times a day before meals., Disp: , Rfl:  .  lactulose 10 gram/15 mL solution, Take 30 mL (20 g total) by mouth in the morning., Disp: , Rfl:  .  lisinopril  (PRINIVIL ,ZESTRIL ) 40 MG tablet, Take 1 tablet (40 mg total) by mouth every evening., Disp: , Rfl:  .  omeprazole  (PRILOSEC) 20 MG capsule, Take 1 capsule (20 mg total) by mouth daily., Disp: , Rfl:  .  paricalcitol (ZEMPLAR) 5 mcg/mL injection, Infuse 1.6 mL (8 mcg total) into a venous catheter 3 (three) times a week., Disp: , Rfl:  .  sevelamer (RENVELA) 800 mg tablet, Take 3 tablets (2,400 mg total) by mouth Three (3) times a day with a meal., Disp: , Rfl:  ALLERGIES   Allergies[3]  FAMILY HISTORY   Family History[4]  SOCIAL HISTORY   Social History[5]  PHYSICAL EXAM   VITAL SIGNS:  Vitals:   10/24/23 2126 10/24/23 2211 10/24/23 2304 10/24/23 2306  BP: 138/68 134/64    Pulse: 87 82 81   Resp: 18 17 22    Temp: (!) 39.4 C (103 F)   37.6 C (99.6 F)  TempSrc: Oral   Oral  SpO2: 100% 100% 100%   Weight: 73.9 kg (163 lb)     Height: 175.3 cm (5' 9)       Physical Exam CONSTITUTIONAL: The patient is alert and in mild distress. OP: Clear. EYES: Conjunctivae are normal. No scleral icterus. NECK: No stiffness or rigidity. No thyromegaly present. CARDIOVASCULAR: Normal rate, equal pulses bilaterally. No  murmur. RESPIRATORY: Breath sounds normal. ABDOMINAL: Soft. Questionable tenderness in the right abdomen. No masses, and no hepatosplenomegaly. MUSCULOSKELETAL: No edema. No tenderness. NEUROLOGICAL: The patient is alert and oriented at baseline. No focal neuro deficits appreciated. SKIN: Skin is warm and dry. No rash. PSYCHIATRIC: Normal mood and affect.   RELEVANT LAB DATAU     Interpretation of remarkable lab data: No acidosis on venous blood gas, white count is normal respiratory panel normal, chronic renal failure lipase negative lactic acid normal Labs Reviewed  COMPREHENSIVE METABOLIC PANEL - Abnormal; Notable for the following components:      Result Value   Chloride 95 (*)    Anion Gap 12 (*)    BUN 35 (*)    Creatinine 5.64 (*)    eGFR CKD-EPI (2021) Male 13 (*)    Albumin 3.1 (*)    ALT 8 (*)    All other components within normal limits  RESP CARE ONLY BLOOD GAS VENOUS - Abnormal; Notable for the following components:   pH, Venous 7.47 (*)    Base Excess, Ven 7.8 (*)    HCO3, Ven 30.3 (*)    All other components within normal limits  HEMOGLOBIN BG/POC - Abnormal; Notable for the following components:   Hemoglobin 9.7 (*)    All other components within normal limits  CBC W/ AUTO DIFF - Abnormal; Notable for the following components:   RBC 3.31 (*)    HGB 9.5 (*)    HCT 29.0 (*)    RDW 15.3 (*)    Absolute Neutrophils 9.4 (*)    Absolute Lymphocytes 0.9 (*)    All other components within normal limits  RESPIRATORY PATHOGEN PANEL - Normal   Narrative:    This result was obtained using the FDA-cleared [employer] Respiratory Panel 2.1 (RP2.1). Performance characteristics have been verified by the [employer].                 A negative result does not rule out the possibility of infection and should be used in conjunction with other clinical findings as well as patient history. Positive results do not rule out co-infection with organisms not included in  the RP2.1 panel. This assay does not distinguish between rhinovirus and enterovirus. Cross-reactivity may occur between B. pertussis and non-pertussis Bordetella species. All suspected cases and any presumptive positive B. pertussis will be automatically confirmed by a Bordetella pertussis specific assay.  LIPASE - Normal  POCT LACTATE VENOUS - Normal  BLOOD CULTURE  BLOOD CULTURE  CBC W/ DIFFERENTIAL   Narrative:    The following orders were created for panel order CBC w/ Differential.  Procedure                               Abnormality         Status                                    ---------                               -----------         ------                                    CBC w/ Differential[[id number]]         Abnormal            Final result                                               Please view results for these tests on the individual orders.  URINALYSIS WITH MICROSCOPY WITH CULTURE REFLEX   Narrative:    The following orders were created for panel order Urinalysis with Microscopy with Culture Reflex (Clean Catch).                 Procedure                               Abnormality         Status                                    ---------                               -----------         ------                                    Urinalysis with Microsc.SABRASABRA[[id number]]                                                                                 Please view results for these tests on the individual orders.  POCT LACTATE VENOUS - RN OBTAIN  POCT VENOUS BLOOD GAS - RN OBTAIN  URINALYSIS WITH MICROSCOPY WITH CULTURE REFLEX PERFORMABLE    EKG Interpretation   No results found for this visit on 10/24/23 (from the past 4464 hours).  RADIOLOGY/PROCEDURES   CT Abdomen Pelvis W IV Contrast Result Date: 10/24/2023 Exam:  CT of the Abdomen and Pelvis with Contrast  History:  Abdomen pain, possible appendix, on dialysis  Technique:  Routine CT of the abdomen  and pelvis with IV contrast.  AEC (automated exposure control) and/or manual techniques such as size-specific kV and mAs are employed where appropriate to reduce radiation exposure for all CT exams.  Comparison:  08/10/2022  Abdomen and Pelvis CT Findings:  LOWER THORAX:  Mild atelectasis is evident in the lung bases bilaterally. Mild left ventriculomegaly noted.  HEPATOBILIARY:  The liver is unremarkable. The gallbladder is normal. Portal veins are patent.  PANCREAS:  Pancreas appears atrophic and otherwise unremarkable  SPLEEN:  Normal.  ADRENALS:  Normal.  KIDNEYS/URETERS:  The kidneys show No contrast clearance patient is a known renal failure on dialysis. No hydronephrosis.  VASCULAR:  The aorta is not dilated.  LYMPH NODES:  No adenopathy.  BOWEL/MESENTERY:  Marked inflammation is evident in the right midabdomen and right lower quadrant. The appendix is faintly visualized and appears distended up to 1 cm. There is moderate appendiceal wall thickening. Moderate adjacent fluid and gas suggest ruptured appendicitis.  A 2.6 cm x 1.4 cm gas and fluid collection medial to the appendix between the psoas muscle and appendix on image 88 of series 2 is consistent with small abscess. Additional minimal free gas of fluid noted lateral to the appendix on image 90 of series 2.  Small volume of free fluid is present in the pericolic gutter. The adjacent cecum and small bowel appears thick walled likely reactive inflammatory changes. There is no evidence of bowel obstruction.  PELVIC ORGANS:  No additional acute finding in the pelvis  BONES:  No aggressive osseous lesion. No acute osseous abnormality.    1. Findings of ruptured acute appendicitis. Phlegmonous changes with ill-defined fluid and gas collections adjacent to the appendix suggesting early abscess formation.  2. The largest ill-defined gas and fluid collection/abscess is 2.6 cm x 1.4 cm, located between the appendix and right psoas  3. No other acute finding   Signed Proofreader): 10/24/2023 11:21 PM Signed By: Luke Batter  XR Chest 2 views Result Date: 10/24/2023 Exam:  Chest Two Views  History:  Fever. End-stage renal disease, on dialysis  Technique:  2 views  Comparison:  01/12/2023  Findings:  Mild bibasilar patchy and linear opacities suggest atelectasis. Early infiltrate is considered less likely. The lungs otherwise are clear. The heart size is within normal limits. A right-sided venous catheter is in good position, the tip is in the distal superior vena cava     Mild bibasilar atelectasis. Otherwise, negative chest    Signed (Electronic Signature): 10/24/2023 10:59 PM Signed By: Luke Batter   ED COURSE AND MEDICAL DECISION MAKING:     Results    Assessment & Plan Abdominal pain   He has experienced acute right-sided abdominal pain for 2-3 days, worsened by movement and deep breathing. Differential diagnosis includes appendicitis, renal colic, or other intra-abdominal pathology. There is questionable tenderness in the right abdomen. Order a CT scan of the abdomen and perform laboratory tests.  Headache   He reports a concurrent headache with the abdominal pain, though no specific details on severity or type are provided.  Chills   He experiences intermittent chills without fever, which may be related to an underlying infection or systemic condition.  End-stage renal disease on dialysis   He has end-stage renal disease with dialysis access via a right chest catheter. He reports difficulty urinating, possibly due to missed dialysis sessions.  Surgery consulted he was seen evaluated for ruptured appendicitis  Hospitalist consulted for admission end-stage renal disease on  dialysis  Antibiotics initiated secondary to concern for sepsis infection and ruptured appendicitis  FINAL CLINICAL IMPRESSIONS:  1. Ruptured appendicitis   2. ESRD (end stage renal disease) on dialysis                [1] Past Medical  History: Diagnosis Date  . Anemia of chronic disease   . CHF (congestive heart failure)      . Chronic kidney disease   . Diabetes mellitus      . Diabetic nephropathy      . ESRD (end stage renal disease)      . GERD (gastroesophageal reflux disease)   . Heart failure with reduced ejection fraction      . Hypertension   . Malignant hypertension   . PVD (peripheral vascular disease) (CMS-HCC)   . Secondary hyperparathyroidism (HHS-HCC)   . Tinea unguium   . Vitamin D  deficiency   [2] Past Surgical History: Procedure Laterality Date  . PR AMPUTATION METATARSAL+TOE,SINGLE Right 08/25/2020   Procedure: AMPUTATION, METATARSAL, WITH TOE SINGLE;  Surgeon: Garnette Lonni Craven, DPM;  Location: [facility]IN [facility];  Service: Vascular  . PR AMPUTATION METATARSAL+TOE,SINGLE Right 01/12/2022   Procedure: Right foot partial 4th ray amputation;  Surgeon: Richerd JONELLE Blanch, DPM;  Location: MAIN OR [facility];  Service: Vascular  . PR ANASTOMOSIS,AV,ANY SITE Right 09/26/2023   Procedure: ARTERIOVENOUS FISTULA CREATION AND BRANCH LIGATION RIGHT UPPER EXTREMITY;  Surgeon: Aucoin, Victoria Jeanne, MD;  Location: OR [facility];  Service: Vascular  . PR AV FIST REVISE GRFT,W THROMBECTOMY Left 02/19/2023   Procedure: LEFT UPPER EXTREMITY ARTERIOVENOUS OPEN THROMBECTOMY;  Surgeon: Aucoin, Victoria Jeanne, MD;  Location: [facility];  Service: Vascular  . PR DEBRIDEMENT MUSCLE &/FASCIA 1ST 20 SQ CM/< Right 11/01/2019   Procedure: DEBRIDEMENT; SKIN, SUBCUTANEOUS TISSUE, & MUSCLE;  Surgeon: Renella Everette Ruddy, MD;  Location: [facility] OR [facility];  Service: General Surgery  . PR DEEP DISSEC FOOT INFEC,MULTIPLE Right 01/12/2022   Procedure: Right foot I&D and debridement of multiple sites: 28003;  Surgeon: Richerd JONELLE Blanch, DPM;  Location: MAIN OR [facility];  Service: Vascular  . PR INTRO CATH DIALYSIS CIRCUIT DX ANGRPH FLUOR S&I Left 01/15/2023   Procedure: AV Shunt Study with perc declot and intervention. injectors needed.;  Surgeon: Aucoin,  Victoria Jeanne, MD;  Location: [facility];  Service: Cardiology  . PR REVASCULARIZE FEM/POP ARTERY,ANGIOPLASTY/STENT N/A 01/15/2023   Procedure: Peripheral Angiography With Intervention;  Surgeon: Daris Richerd Seta, MD;  Location: [facility];  Service: Cardiology  [3] No Known Allergies [4] Family History Problem Relation Age of Onset  . Kidney disease Mother   . Diabetes Mother   . No Known Problems Father   . Kidney disease Maternal Cousin   . Diabetes Maternal Cousin   [5] Social History Socioeconomic History  . Marital status: Single    Spouse name: None  . Number of children: None  . Years of education: None  . Highest education level: None  Occupational History    Employer: [employer]  Tobacco Use  . Smoking status: Every Day    Current packs/day: 0.15    Average packs/day: 0.2 packs/day for 7.0 years (1.1 ttl pk-yrs)    Types: Cigarettes  . Smokeless tobacco: Never  Vaping Use  . Vaping status: Never Used  Substance and Sexual Activity  . Alcohol  use: Never  . Drug use: Not Currently    Types: Marijuana   Social Drivers of Health   Financial Resource Strain: Low Risk  (02/19/2023)   Overall  Financial Resource Strain (CARDIA)   . Difficulty of Paying Living Expenses: Not hard at all  Food Insecurity: No Food Insecurity (02/19/2023)   Hunger Vital Sign   . Worried About Programme researcher, broadcasting/film/video in the Last Year: Never true   . Ran Out of Food in the Last Year: Never true  Transportation Needs: No Transportation Needs (02/19/2023)   PRAPARE - Transportation   . Lack of Transportation (Medical): No   . Lack of Transportation (Non-Medical): No  Physical Activity: Insufficiently Active (01/11/2022)   Exercise Vital Sign   . Days of Exercise per Week: 3 days   . Minutes of Exercise per Session: 10 min  Social Connections: Socially Isolated (01/11/2022)   Social Connection and Isolation Panel   . Frequency of Communication with Friends and Family: More than three times a  week   . Frequency of Social Gatherings with Friends and Family: More than three times a week   . Attends Religious Services: Never   . Active Member of Clubs or Organizations: No   . Attends Banker Meetings: Never   . Marital Status: Never married  Housing: Low Risk  (02/19/2023)   Housing   . Within the past 12 months, have you ever stayed: outside, in a car, in a tent, in an overnight shelter, or temporarily in someone else's home (i.e. couch-surfing)?: No   . Are you worried about losing your housing?: No   Baruch Bernardino Mungo, MD 10/24/23 2325

## 2023-10-29 NOTE — Discharge Summary (Signed)
 [facility] Discharge Summary         Admit date:    10/24/2023 Discharge date:   10/29/2023  Discharge Service:   [facility] Attending Physician: [facility] Discharge to:    [facility]  Condition at Discharge:  stable Code Status:    Full Code  Patient Care Team: [facility] as PCP - General  Consults       General Surgery Nephrology  Discharge Diagnoses   Principal Problem:   Ruptured appendicitis (POA: Yes) Active Problems:   Hypertension (POA: Yes)   Diabetes    (POA: Yes)   HFrEF (heart failure with reduced ejection fraction)    (POA: Yes)   ESRD (end stage renal disease) on dialysis    (POA: Not Applicable)   Acute appendicitis (POA: Yes) Resolved Problems:   * No resolved hospital problems. [facility] Course   28 y.o.  male with a history of hypertension, HFrEF, ESRD on dialysis, secondary hyperparathyroidism, vitamin D  deficiency, and GERD who [facility] for generalized bodyaches &  abdominal pain for 2 to 3 days. Subsequently he developed nausea, vomiting and was noted to have elevated blood sugars in the 400s. On presentation to [facility] patient was febrile with temperature of 103 F, mildly tachypneic but BP was stable.  Labs showed hemoglobin 9.5, BUN 35, creatinine 5.64, venous pH 7.47, venous lactate 0.8. Respiratory pathogen panel negative, chest x-ray showed mild bibasilar atelectasis. CT of the abdomen pelvis showed ruptured acute appendicitis and findings suggestive of early abscess formation measuring 2.6 x 1.4 cm located between the appendix and the right psoas.  General surgery was consulted and recommended CT-guided drainage by interventional radiology for early abscess formation. Patient was evaluated by interventional radiologist and felt it was not possible to do CT-guided drainage due to matted bowel and risk for  perforation. Patient was covered with empiric IV Zosyn .  Blood cultures returned negative.  Patient eventually underwent laparoscopic appendectomy on 10/26/2023 by Dr. Cristi. Postoperatively patient had a JP drain which was subsequently removed prior to discharge. Patient's appetite is improved and postop pain is resolving.  Nephrology was consulted and provided hemodialysis. Patient has a fistula placed last month which has not matured. BP was elevated as initial BP meds were held and have subsequently been resumed. Patient was given Epogen  with hemodialysis on 10/28/2023.  He is being discharged back to [facility] in a stable condition and advised to complete oral Augmentin  He advised to follow-up with general surgery clinic or [facility] surgeon.   I spent greater than 30 mins in the discharge of this patient.  Procedures   Procedure(s) (LRB): LAPAROSCOPIC APPENDECTOMY (N/A)   10/26/2023  Vig, Toribio Charleston, MD  Discharge Medications     Your Medication List     START taking these medications    amoxicillin -clavulanate 875-125 mg per tablet Commonly known as: AUGMENTIN  Take 1 tablet by mouth two (2) times a day for 7 days.       CHANGE how you take these medications    cloNIDine HCL 0.2 MG tablet Commonly known as: CATAPRES Take 1 tablet (0.2 mg total) by mouth Three (3) times a day. What changed: Another medication with the same name was removed. Continue taking this medication, and follow the directions you see here.       CONTINUE taking these medications    acetaminophen  325  MG tablet Commonly known as: TYLENOL  Take 2 tablets (650 mg total) by mouth every Monday, Wednesday, and Friday. As needed on dialysis days   amlodipine  10 MG tablet Commonly known as: NORVASC  Take 1 tablet (10 mg total) by mouth every Monday, Wednesday, and Friday.   aspirin 81 MG tablet Commonly known as: ECOTRIN Take 1 tablet (81 mg total) by mouth daily.   calcium  acetate(phosphat bind) 667  mg capsule Commonly known as: PHOSLO  Take 4 capsules (2,668 mg total) by mouth Three (3) times a day with a meal.   carvedilol  25 MG tablet Commonly known as: COREG  Take 1 tablet (25 mg total) by mouth two (2) times a day.   cholecalciferol-25 mcg (1,000 unit) 1,000 unit (25 mcg) tablet Generic drug: cholecalciferol (vitamin D3 25 mcg (1,000 units)) Take 1 tablet (25 mcg total) by mouth daily.   epoetin  alfa 20,000 unit/mL injection Commonly known as: EPOGEN ,PROCRIT  Inject 0.41 mL (8,200 Units total) under the skin 3 (three) times a week.   hydrALAZINE  100 MG tablet Commonly known as: APRESOLINE  Take 1 tablet (100 mg total) by mouth two (2) times a day.   insulin  glargine 100 unit/mL injection Commonly known as: LANTUS  Inject 0.12 mL (12 Units total) under the skin nightly.   insulin  regular 100 unit/mL injection Commonly known as: HumuLIN,NovoLIN Inject 0.04 mL (4 Units total) under the skin Three (3) times a day before meals.   lactulose 10 gram/15 mL solution Take 30 mL (20 g total) by mouth in the morning.   lisinopril  40 MG tablet Commonly known as: PRINIVIL ,ZESTRIL  Take 1 tablet (40 mg total) by mouth every evening.   omeprazole  20 MG capsule Commonly known as: PriLOSEC Take 1 capsule (20 mg total) by mouth in the morning.   paricalcitol 5 mcg/mL injection Commonly known as: ZEMPLAR Infuse 1.6 mL (8 mcg total) into a venous catheter 3 (three) times a week.   PRO-STAT 64 ORAL Take 10 mL by mouth 3 (three) times a week.   sevelamer 800 mg tablet Commonly known as: RENVELA Take 4 tablets (3,200 mg total) by mouth Three (3) times a day with a meal.   UNABLE TO FIND Infuse 2,000 Units into a venous catheter every Monday, Wednesday, and Friday. Med Name: Heparin  1,000units/ML   UNABLE TO FIND Infuse 125 mg into a venous catheter every fourteen (14) days. Med Name: Sodium Ferrous Gluconate Compx.SABRA administer in Dialysis every other Friday   vitamin B complex  tablet Take 1 tablet by mouth daily. On dialysis days        Lab Results    BLOOD Recent Labs    Units 10/24/23 2137 10/25/23 1030 10/26/23 0338 10/27/23 0411 10/28/23 0759  WBC 10*9/L 10.7 10.7 7.9 9.0 6.4  HGB g/dL 9.7*  9.5* 9.2* 9.6* 9.0* 9.4*  HCT % 29.0* 27.5* 29.2* 27.4* 28.6*  PLT 10*9/L 203 183 186 190 198   Recent Labs    Units 10/24/23 2137 10/26/23 0338 10/27/23 0411 10/28/23 0759  NA mmol/L 136 137 137 133*  K mmol/L 4.7 3.5 4.4 3.9  CL mmol/L 95* 96* 98 95*  CO2 mmol/L 29.0 24.0 25.0 27.0  BUN mg/dL 35* 20 28* 36*  CREATININE mg/dL 4.35* 5.89* 4.37* 3.02*  GLU mg/dL 866 886 837 757*  CALCIUM  mg/dL 9.5 9.2 9.0 8.9  ALBUMIN g/dL 3.1*  --  2.6*  --   PROT g/dL 7.0  --  6.8  --   BILITOT mg/dL 1.1  --  0.9  --  AST U/L <8  --  12  --   ALT U/L 8*  --  <7*  --   ALKPHOS U/L 84  --  81  --   MG mg/dL  --   --  2.1  --   PHOS mg/dL  --   --  4.9  --   LIPASE U/L 22  --   --   --   LACTATE mmol/L 0.8  --   --   --     Recent Labs    Units 10/25/23 0637  INR  1.13   Recent Labs    Units 10/28/23 0759  CRP mg/L 202.4*      Microbiology Results (last day)     Procedure Component Value Date/Time Date/Time   Blood Culture #1 [[id number]]  (Normal) Collected: 10/24/23 2138   Lab Status: Preliminary result Specimen: Blood from 1 Peripheral Draw Updated: 10/28/23 2200    Blood Culture, Routine No Growth at 4 days   Blood Culture #2 [[id number]]  (Normal) Collected: 10/24/23 2138   Lab Status: Preliminary result Specimen: Blood from 1 Peripheral Draw Updated: 10/28/23 2145    Blood Culture, Routine No Growth at 4 days       Imaging   CT Abdomen Pelvis W IV Contrast Result Date: 10/24/2023 Exam:  CT of the Abdomen and Pelvis with Contrast  History:  Abdomen pain, possible appendix, on dialysis  Technique:  Routine CT of the abdomen and pelvis with IV contrast.  AEC (automated exposure control) and/or manual techniques such as size-specific kV  and mAs are employed where appropriate to reduce radiation exposure for all CT exams.  Comparison:  08/10/2022  Abdomen and Pelvis CT Findings:  LOWER THORAX:  Mild atelectasis is evident in the lung bases bilaterally. Mild left ventriculomegaly noted.  HEPATOBILIARY:  The liver is unremarkable. The gallbladder is normal. Portal veins are patent.  PANCREAS:  Pancreas appears atrophic and otherwise unremarkable  SPLEEN:  Normal.  ADRENALS:  Normal.  KIDNEYS/URETERS:  The kidneys show No contrast clearance patient is a known renal failure on dialysis. No hydronephrosis.  VASCULAR:  The aorta is not dilated.  LYMPH NODES:  No adenopathy.  BOWEL/MESENTERY:  Marked inflammation is evident in the right midabdomen and right lower quadrant. The appendix is faintly visualized and appears distended up to 1 cm. There is moderate appendiceal wall thickening. Moderate adjacent fluid and gas suggest ruptured appendicitis.  A 2.6 cm x 1.4 cm gas and fluid collection medial to the appendix between the psoas muscle and appendix on image 88 of series 2 is consistent with small abscess. Additional minimal free gas of fluid noted lateral to the appendix on image 90 of series 2.  Small volume of free fluid is present in the pericolic gutter. The adjacent cecum and small bowel appears thick walled likely reactive inflammatory changes. There is no evidence of bowel obstruction.  PELVIC ORGANS:  No additional acute finding in the pelvis  BONES:  No aggressive osseous lesion. No acute osseous abnormality.    1. Findings of ruptured acute appendicitis. Phlegmonous changes with ill-defined fluid and gas collections adjacent to the appendix suggesting early abscess formation.  2. The largest ill-defined gas and fluid collection/abscess is 2.6 cm x 1.4 cm, located between the appendix and right psoas  3. No other acute finding  Signed Proofreader): 10/24/2023 11:21 PM Signed By: Luke Batter  XR Chest 2 views Result Date:  10/24/2023 Exam:  Chest Two Views  History:  Fever.  End-stage renal disease, on dialysis  Technique:  2 views  Comparison:  01/12/2023  Findings:  Mild bibasilar patchy and linear opacities suggest atelectasis. Early infiltrate is considered less likely. The lungs otherwise are clear. The heart size is within normal limits. A right-sided venous catheter is in good position, the tip is in the distal superior vena cava     Mild bibasilar atelectasis. Otherwise, negative chest    Signed (Electronic Signature): 10/24/2023 10:59 PM Signed By: Luke Batter    Discharge Instructions   Diet Instructions     Discharge diet (specify)     Discharge Nutrition Therapy: Consistent Carb   Consistent Carb Level: Consistent Carb 60/60/60 (4/4/4)      Activity Instructions     Activity as tolerated     Lifting restrictions (specify)     Weight restriction of 15 lbs x 4 weeks   OK to shower (no bath)     Other restrictions (specify):     Avoid submersion in water  until post op followup. Showers okay.      Follow Up instructions and Outpatient Referrals    Ambulatory Referral to General Surgery     Reason for referral: s/p lap appy   Call MD for:  persistent nausea or vomiting     Call MD for:  redness, tenderness, or signs of infection (pain, swelling,  redness, odor or green/yellow discharge around incision site)     Call MD for:  severe uncontrolled pain     Call MD for:  temperature >38.5 Celsius (101.3 Fahrenheit)     Discharge instructions         Future Appointments  Date Time Provider Department Center  11/06/2023  8:15 AM [facility]  11/06/2023 11:15 AM Camu, Diane Virginia  Verne, FNP [facility]

## 2023-12-31 ENCOUNTER — Other Ambulatory Visit (HOSPITAL_COMMUNITY): Payer: Self-pay

## 2024-01-21 NOTE — Unmapped External Note (Signed)
 01/21/2024  PRE-OPERATIVE DIAGNOSIS: Dysfunctional right extremity arteriovenous fistula  POST-OPERATIVE DIAGNOSIS:  Same  PROCEDURE: Right extremity fistulogram Ultrasound guided access of the right radial artery  Cutting balloon angioplasty right cephalic vein - 6x100 angiosculpt Balloon angioplasty right cephalic vein - 7x150 jade  TR band to right wrist   ANESTHESIA: Local with IV sedation. Prior to the administration of sedation, the patient was evaluated with pre procedure documentation completed - refer to Immediate Pre Procedure Airway & Anesthesia/Pre-Sedation Reassessment Form.   Moderate / Deep Sedation with 1 mg Versed  and 25 mcg Fentanyl  was used for this procedure.  Please access the procedural documentation for dosages of medications used.  I continuously monitored the patient during the administration of moderate / deep sedation for the duration of this procedure for a total time of 40 minutes. The procedural nurse was present for the duration of the procedure to assist in monitoring the patient. Patient assessed post sedation/anesthesia and tolerated well.  Vital signs stable and no complications noted.   PRIMARY SURGEON: Victoria Aucoin, MD  ESTIMATED BLOOD LOSS: 5 mL  SPECIMEN: None  INDICATIONS: This is a 28 y.o. male with a dysfunctional right upper extremity dialysis access with poor maturation and suspected stenosis within fistula.  FINDINGS: Initial venogram reveals multifocal greater than 50% stenoses throughout the right cephalic vein  Completion venogram reveals 0 to 10% residual stenosis throughout the right cephalic vein, excellent thrill at completion  Okay for attempt at cannulation  PROCEDURE IN DETAIL: The patient was identified in the pre-operative area. The risks, benefits and alternatives of the procedure were described to the patient.  The patient was given the opportunity to ask questions and all questions were answered to the patient's  satisfaction.  Informed consent for the procedure was obtained.   A consent form was signed, witnessed and placed in the patients chart.  The patient was transferred to the angio suite. Upon entry into the angiosuite the initial time-out verification was performed; verifying the patient identity / medical record number, procedural site, patient allergies, presence of a signed, witnessed and valid consent form for the procedure.  Pre-procedural antibiotics were administered.  The patient was placed in the supine position. After administration of appropriate anesthesia, the patient's right arm was prepped and draped in sterile fashion.  The mid portion of the fistula was infiltrated with a lidocaine  solution.  Ultrasound image of the fistula was obtained and was deemed patent and suitable for use, and under live ultrasound guided access, a needle and wire was placed into the fistula.  The image documenting this event was saved and logged. Hard copy images and reporting are documented.  The needle was removed over the wire and replaced with a micro sheath. Wire access was obtained and a 6 Fr sheath was placed. A fistulogram was performed.  This demonstrated multifocal greater than 50% stenoses throughout the right cephalic vein.  A 0.4 wire was passed the area of stenosis into the cephalic arch and over the wire and a 6 x 100 cutting balloon was advanced and balloon angioplasty with with a Cutting Balloon was performed of the right cephalic vein in its entirety.  This was followed by regular balloon angioplasty with a 7 mm balloon throughout the cephalic vein.  Completion venogram revealed excellent outflow and 0% residual stenosis with no flow limitations.  The fistula had an excellent thrill and was palpable throughout the arm at completion.  Okay for attempt at cannulation.  The sheath and wires were then  removed from the patient.  The fistulotomy site was repaired by use of a 4-0 Prolene in U-stitch  fashion.  The patient tolerated the procedure well and was taken to the recovery area in stable condition.  The fistula maintained a strong thrill throughout the course of the procedure. There were no immediate peri-procedural complications.  Victoria Aucoin, MD

## 2024-01-21 NOTE — H&P (Signed)
 ------------------------------------------------------------------------------- Attestation signed by Aucoin, Victoria Jeanne, MD at 01/21/24 [phone] I reviewed all of the above-stated data. I have discussed this patient with the physician extender in detail.  I agree with the documentation regarding today's clinical encounter as described above, as it represents my decision making.  Victoria J. Aucoin, M.D. [facility] [phone] 01/21/2024 -------------------------------------------------------------------------------  [facility] ADMISSION HISTORY AND PHYSICAL  Date of Service: 01/21/2024  Attending Physician: Dr. Richerd Magnus   Primary Care Physician: [facility]   Chief Complaint: do something to my right arm   SURGICAL ASSESSMENT / PLAN:  ESRD - Vascular history listed below  - S/P RUE BC AVF creation with branch ligation on 09/27/23 with Dr. Magnus - post op duplex with several areas within the lumen of the cephalic vein in the distal upper arm where there are intralumenal synechia with significant flow disturbances seen by color Doppler and areas of increased velocities with the most significant area having velocities increasing from 141cm/s to 492cm/s. - Incision sites well healed with no signs of infection or hematoma.  - Strong palpable thrill, + bruit. No signs of steal syndrome. Motor/sensory function intact. - Currently using Right Chest [facility] at hemodialysis on Monday, Wednesdays and Fridays at [facility] - Fistula is not ready for cannulation.  - Planned for RIGHT upper extremity fistulagram with intervention today with Dr. Victoria Aucoin today in the angiography suite.  Full discussion of procedure / risks / benefits / alternatives to the procedure previously discussed with the patient by Dr. Aucoin and personally reviewed today.  Patient is agreeable to proceed with the planned intervention.   CO-MORBID PROBLEMS: Problem  List[1]   VASCULAR HISTORY: 09/27/23 RUE BC AVF creation with cephalic vein branch ligation (Aucoin) 08/27/23 aborted LUE AVF/AVG creation - no suitable veins for creation [facility]) 02/19/2023 - Open declot of AVG with Dr. Aucoin 01/15/2023 - Declot of left brachial artery to axillary vein AV graft  (penumbra) and Angioplasty of mid graft stenosis, Angioplasty of the distal graft and venous outflow stent with Dr. Aucoin 01/12/2022 - Status post right foot partial fourth ray amputation and debridement of wound [facility])   Further problems will be addressed as they arise during this admission.  The patient was seen with Dr. Victoria Aucoin. The attending has reviewed the data, interviewed and examined the patient and agrees with the above mentioned plan as this represents her medical decision making.     History of Present Illness: 28 year old male with ESRD on HD MWF via R IJ TDC at the [facility]. Status post RIGHT brachiocephalic arteriovenous fistula creation with branch ligation on 09/27/2023. Follow up duplex imaging with several areas within the lumen of the cephalic vein in the distal upper arm where there are intralumenal synechia with significant flow disturbances seen by color Doppler and areas of increased velocities with the most significant area having velocities increasing from 141cm/s to 492cm/s. Not ready for cannulation. Presents today for fistulagram and intervention.    Allergies Patient has no known allergies.  Medications   Current Medications[2]  Past Medical History Past Medical History[3]  Past Surgical History Past Surgical History[4]  Family History The patient's family history includes Diabetes in his maternal cousin and mother; Kidney disease in his maternal cousin and mother; No Known Problems in his father..  Social History: Tobacco use: yes cigarettes  Alcohol  use:no Drug use: no  Review of Systems History obtained from the patient General ROS:  negative Psychological ROS: negative Ophthalmic  ROS: negative ENT ROS: negative Hematological and Lymphatic ROS: negative Endocrine ROS: negative Respiratory ROS: negative Cardiovascular ROS: negative Gastrointestinal ROS: negative Genito-Urinary ROS: negative Musculoskeletal ROS: negative Neurological ROS: negative Dermatological ROS: negative   Vital Signs Patient Vitals for the past 8 hrs:  BP Temp Temp src Pulse SpO2 Pulse Resp SpO2 Height Weight  01/21/24 0700 181/105 37.2 C (99 F) Tympanic 86 86 16 100 % -- --  01/21/24 0612 -- -- -- -- -- -- -- 175.3 cm (5' 9) 71.5 kg (157 lb 9.6 oz)   No intake/output data recorded.   PHYSICAL EXAM:    CONSTITUTIONAL:   Appears well-developed and well-nourished.  No acute distress.   Awake, alert, pleasant and cooperative with my examination.  HEENT:     Normocephalic and atraumatic. Conjunctivae are normal. No scleral icterus.  No periorbital edema.  Oropharynx pink and moist, no lesions or discharge.  No rhinorrhea noted.   NECK:  Normal range of motion. No JVD present. No tracheal deviation present.   Thyroid  borders not readily palpable.  PULMONARY:  Effort normal and breath sounds normal. No stridor.   ABDOMEN: Soft. Non-tender, non-distended.  +bowel sounds.  No evidence of pulsatile abdominal mass. No other intra-abdominal masses appreciated.  No auscultated bruits.  LYMPHATIC:  No cervical or inguinal adenopathy.   NEUROLOGICAL:  Alert and oriented to person, place, and time. No evident cranial nerve deficits.  Tongue protrudes midline.  Speech is clear and concise.  Peripheral strength and sensation are equal and intact in the bilateral upper and lower extremities.    PSYCHIATRIC:  Mood & affect appropriate and congruent.  SKIN:  No skin rash or lesions noted.    MUSCULOSKELETAL: No evidence of bony deformity or soft tissue mass in the upper or lower extremities.  No joint swelling or  effusions.  CARDIOVASCULAR:  Normal cardiac rate and regular rhythm. RUE AVF with thrill/bruit. Pulsatile. Palpable radial pulse distally. Normal motor/sensory function.    Laboratory Data: All lab results last 24 hours:   Recent Results (from the past 24 hours)  POCT Glucose   Collection Time: 01/21/24  6:32 AM  Result Value Ref Range   Glucose, POC 143 70 - 179 mg/dL   Operator ID Priscilla, Deenie     Radiological Data:   No results found.   Brandy C. Tanda, [facility]  *If I am in the hospital, I will be available by Secure Chat or pager system* *If Secure Chat says BUSY, I will get back to you as soon as I can. If an emergency, page our consult phone.* *If Secure Chat says OFFLINE, then I am not not available and you can page our consult phone*           [1] Patient Active Problem List Diagnosis  . Hypertension  . Obesity  . Diabetes    (CMS-HCC)  . Cellulitis  . Tobacco use disorder  . Osteomyelitis of right foot    (CMS-HCC)  . HFrEF (heart failure with reduced ejection fraction)    (CMS-HCC)  . ESRD (end stage renal disease) on dialysis    (CMS-HCC)  . Anemia  . Abdominal pain  . Malfunction of arteriovenous dialysis fistula  . Acute appendicitis  . Ruptured appendicitis  . Acute metabolic encephalopathy  . Cannabinoid hyperemesis syndrome  . GERD (gastroesophageal reflux disease)  . Paroxysmal atrial fibrillation    (CMS-HCC)  . Type 2 diabetes mellitus with diabetic foot ulcer    (CMS-HCC)  .  Stage 4 chronic kidney disease    (CMS-HCC)  . Chronic combined systolic and diastolic CHF (congestive heart failure)    (CMS-HCC)  [2] Current Facility-Administered Medications  Medication Dose Route Frequency Provider Last Rate Last Admin  . sodium chloride  (NS) 0.9 % infusion  20 mL/hr Intravenous Continuous Gretta Lonell Ip, PA      [3] Past Medical History: Diagnosis Date  . Anemia of chronic disease   . CHF (congestive  heart failure)    (CMS-HCC)   . Chronic kidney disease   . Diabetes mellitus    (CMS-HCC)   . Diabetic nephropathy    (CMS-HCC)   . ESRD (end stage renal disease)    (CMS-HCC)   . GERD (gastroesophageal reflux disease)   . Heart failure with reduced ejection fraction    (CMS-HCC)   . Hypertension   . Malignant hypertension   . PVD (peripheral vascular disease)   . Secondary hyperparathyroidism (HHS-HCC)   . Tinea unguium   . Vitamin D  deficiency   [4] Past Surgical History: Procedure Laterality Date  . PR AMPUTATION METATARSAL+TOE,SINGLE Right 08/25/2020   Procedure: AMPUTATION, METATARSAL, WITH TOE SINGLE;  Surgeon: Garnette Lonni Craven, DPM;  Location: MAIN OR [facility];  Service: Vascular  . PR AMPUTATION METATARSAL+TOE,SINGLE Right 01/12/2022   Procedure: Right foot partial 4th ray amputation;  Surgeon: Richerd JONELLE Blanch, DPM;  Location: MAIN OR [facility];  Service: Vascular  . PR ANASTOMOSIS,AV,ANY SITE Right 09/26/2023   Procedure: ARTERIOVENOUS FISTULA CREATION AND BRANCH LIGATION RIGHT UPPER EXTREMITY;  Surgeon: Aucoin, Victoria Jeanne, MD;  Location: OR [facility];  Service: Vascular  . PR AV FIST REVISE GRFT,W THROMBECTOMY Left 02/19/2023   Procedure: LEFT UPPER EXTREMITY ARTERIOVENOUS OPEN THROMBECTOMY;  Surgeon: Aucoin, Victoria Jeanne, MD;  Location: OR [facility];  Service: Vascular  . PR DEBRIDEMENT MUSCLE &/FASCIA 1ST 20 SQ CM/< Right 11/01/2019   Procedure: DEBRIDEMENT; SKIN, SUBCUTANEOUS TISSUE, & MUSCLE;  Surgeon: Renella Everette Ruddy, MD;  [facility];  Service: General Surgery  . PR DEEP DISSEC FOOT INFEC,MULTIPLE Right 01/12/2022   Procedure: Right foot I&D and debridement of multiple sites: 28003;  Surgeon: Richerd JONELLE Blanch, DPM;  Location: MAIN OR [facility];  Service: Vascular  . PR INTRO CATH DIALYSIS CIRCUIT DX ANGRPH FLUOR S&I Left 01/15/2023   Procedure: AV Shunt Study with perc declot and intervention. injectors needed.;  Surgeon: Aucoin, Victoria Jeanne, MD;  Location: [facility];  Service: Cardiology  . PR LAP,APPENDECTOMY N/A 10/26/2023   Procedure: LAPAROSCOPIC APPENDECTOMY;  Surgeon: Cristi Toribio Charleston, MD;  Location: OR [facility];  Service: General Surgery  . PR REVASCULARIZE FEM/POP ARTERY,ANGIOPLASTY/STENT N/A 01/15/2023   Procedure: Peripheral Angiography With Intervention;  Surgeon: Daris Richerd Seta, MD;  Location: [facility];  Service: Cardiology

## 2024-02-11 ENCOUNTER — Observation Stay

## 2024-02-11 ENCOUNTER — Emergency Department
Admit: 2024-02-11 | Discharge: 2024-02-11 | Disposition: A | Attending: Physician Assistant | Admitting: Physician Assistant

## 2024-02-11 ENCOUNTER — Observation Stay
Admission: EM | Admit: 2024-02-11 | Discharge: 2024-02-12 | Disposition: A | Discharge status: Home / Self Care | Attending: Osteopathic Medicine | Admitting: Osteopathic Medicine

## 2024-02-11 ENCOUNTER — Encounter: Payer: Self-pay | Admitting: Emergency Medicine

## 2024-02-11 ENCOUNTER — Emergency Department

## 2024-02-11 ENCOUNTER — Other Ambulatory Visit: Payer: Self-pay

## 2024-02-11 DIAGNOSIS — Z7982 Long term (current) use of aspirin: Secondary | ICD-10-CM | POA: Insufficient documentation

## 2024-02-11 DIAGNOSIS — R54 Age-related physical debility: Secondary | ICD-10-CM | POA: Insufficient documentation

## 2024-02-11 DIAGNOSIS — I5022 Chronic systolic (congestive) heart failure: Secondary | ICD-10-CM | POA: Diagnosis not present

## 2024-02-11 DIAGNOSIS — R17 Unspecified jaundice: Secondary | ICD-10-CM | POA: Insufficient documentation

## 2024-02-11 DIAGNOSIS — R9431 Abnormal electrocardiogram [ECG] [EKG]: Secondary | ICD-10-CM | POA: Diagnosis not present

## 2024-02-11 DIAGNOSIS — F17210 Nicotine dependence, cigarettes, uncomplicated: Secondary | ICD-10-CM | POA: Insufficient documentation

## 2024-02-11 DIAGNOSIS — Z794 Long term (current) use of insulin: Secondary | ICD-10-CM | POA: Insufficient documentation

## 2024-02-11 DIAGNOSIS — E1022 Type 1 diabetes mellitus with diabetic chronic kidney disease: Secondary | ICD-10-CM | POA: Diagnosis not present

## 2024-02-11 DIAGNOSIS — I480 Paroxysmal atrial fibrillation: Secondary | ICD-10-CM | POA: Diagnosis not present

## 2024-02-11 DIAGNOSIS — R1013 Epigastric pain: Secondary | ICD-10-CM | POA: Diagnosis not present

## 2024-02-11 DIAGNOSIS — Z992 Dependence on renal dialysis: Secondary | ICD-10-CM | POA: Insufficient documentation

## 2024-02-11 DIAGNOSIS — E440 Moderate protein-calorie malnutrition: Secondary | ICD-10-CM | POA: Insufficient documentation

## 2024-02-11 DIAGNOSIS — K3184 Gastroparesis: Secondary | ICD-10-CM | POA: Insufficient documentation

## 2024-02-11 DIAGNOSIS — F1910 Other psychoactive substance abuse, uncomplicated: Secondary | ICD-10-CM | POA: Diagnosis not present

## 2024-02-11 DIAGNOSIS — Z79899 Other long term (current) drug therapy: Secondary | ICD-10-CM | POA: Insufficient documentation

## 2024-02-11 DIAGNOSIS — Z1152 Encounter for screening for COVID-19: Secondary | ICD-10-CM | POA: Insufficient documentation

## 2024-02-11 DIAGNOSIS — E108 Type 1 diabetes mellitus with unspecified complications: Secondary | ICD-10-CM | POA: Diagnosis present

## 2024-02-11 DIAGNOSIS — D631 Anemia in chronic kidney disease: Secondary | ICD-10-CM | POA: Insufficient documentation

## 2024-02-11 DIAGNOSIS — R112 Nausea with vomiting, unspecified: Secondary | ICD-10-CM | POA: Diagnosis not present

## 2024-02-11 DIAGNOSIS — R5381 Other malaise: Secondary | ICD-10-CM | POA: Diagnosis present

## 2024-02-11 DIAGNOSIS — F1920 Other psychoactive substance dependence, uncomplicated: Secondary | ICD-10-CM | POA: Insufficient documentation

## 2024-02-11 DIAGNOSIS — F418 Other specified anxiety disorders: Secondary | ICD-10-CM | POA: Diagnosis not present

## 2024-02-11 DIAGNOSIS — R4189 Other symptoms and signs involving cognitive functions and awareness: Secondary | ICD-10-CM | POA: Diagnosis not present

## 2024-02-11 DIAGNOSIS — R1084 Generalized abdominal pain: Secondary | ICD-10-CM | POA: Diagnosis present

## 2024-02-11 DIAGNOSIS — N186 End stage renal disease: Secondary | ICD-10-CM | POA: Insufficient documentation

## 2024-02-11 DIAGNOSIS — F1290 Cannabis use, unspecified, uncomplicated: Secondary | ICD-10-CM | POA: Insufficient documentation

## 2024-02-11 LAB — CBC
HCT: 31.3 % — ABNORMAL LOW (ref 39.0–52.0)
Hemoglobin: 10 g/dL — ABNORMAL LOW (ref 13.0–17.0)
MCH: 29.4 pg (ref 26.0–34.0)
MCHC: 31.9 g/dL (ref 30.0–36.0)
MCV: 92.1 fL (ref 80.0–100.0)
Platelets: 223 K/uL (ref 150–400)
RBC: 3.4 MIL/uL — ABNORMAL LOW (ref 4.22–5.81)
RDW: 14.3 % (ref 11.5–15.5)
WBC: 5 K/uL (ref 4.0–10.5)
nRBC: 0 % (ref 0.0–0.2)

## 2024-02-11 LAB — GLUCOSE, CAPILLARY: Glucose-Capillary: 82 mg/dL (ref 70–99)

## 2024-02-11 LAB — CBG MONITORING, ED: Glucose-Capillary: 102 mg/dL — ABNORMAL HIGH (ref 70–99)

## 2024-02-11 LAB — COMPREHENSIVE METABOLIC PANEL WITH GFR
ALT: 11 U/L (ref 0–44)
AST: 11 U/L — ABNORMAL LOW (ref 15–41)
Albumin: 4.1 g/dL (ref 3.5–5.0)
Alkaline Phosphatase: 79 U/L (ref 38–126)
Anion gap: 18 — ABNORMAL HIGH (ref 5–15)
BUN: 13 mg/dL (ref 6–20)
CO2: 28 mmol/L (ref 22–32)
Calcium: 8.9 mg/dL (ref 8.9–10.3)
Chloride: 96 mmol/L — ABNORMAL LOW (ref 98–111)
Creatinine, Ser: 5.26 mg/dL — ABNORMAL HIGH (ref 0.61–1.24)
GFR, Estimated: 14 mL/min — ABNORMAL LOW (ref >60)
Glucose, Bld: 87 mg/dL (ref 70–99)
Potassium: 3.7 mmol/L (ref 3.5–5.1)
Sodium: 142 mmol/L (ref 135–145)
Total Bilirubin: 2 mg/dL — ABNORMAL HIGH (ref 0.0–1.2)
Total Protein: 7.8 g/dL (ref 6.5–8.1)

## 2024-02-11 LAB — TROPONIN I (HIGH SENSITIVITY)
Troponin I (High Sensitivity): 38 ng/L — ABNORMAL HIGH (ref <18)
Troponin I (High Sensitivity): 39 ng/L — ABNORMAL HIGH (ref <18)

## 2024-02-11 LAB — LIPASE, BLOOD: Lipase: 27 U/L (ref 11–51)

## 2024-02-11 LAB — RESP PANEL BY RT-PCR (RSV, FLU A&B, COVID)  RVPGX2
Influenza A by PCR: NEGATIVE
Influenza B by PCR: NEGATIVE
Resp Syncytial Virus by PCR: NEGATIVE
SARS Coronavirus 2 by RT PCR: NEGATIVE

## 2024-02-11 MED ORDER — HEPARIN SODIUM (PORCINE) 5000 UNIT/ML IJ SOLN
5000.0000 [IU] | Freq: Three times a day (TID) | INTRAMUSCULAR | Status: DC
  Administered 2024-02-11 – 2024-02-12 (×2): 5000 [IU] via SUBCUTANEOUS
  Filled 2024-02-11 (×2): qty 1

## 2024-02-11 MED ORDER — INSULIN ASPART 100 UNIT/ML IJ SOLN: 0.0000 [IU] | Freq: Every day | INTRAMUSCULAR | Status: DC

## 2024-02-11 MED ORDER — LIDOCAINE VISCOUS HCL 2 % MT SOLN
15.0000 mL | Freq: Once | OROMUCOSAL | Status: AC
  Administered 2024-02-11: 15 mL via ORAL
  Filled 2024-02-11: qty 15

## 2024-02-11 MED ORDER — PANTOPRAZOLE SODIUM 40 MG IV SOLR
40.0000 mg | Freq: Once | INTRAVENOUS | Status: AC
  Administered 2024-02-11: 40 mg via INTRAVENOUS
  Filled 2024-02-11: qty 10

## 2024-02-11 MED ORDER — ACETAMINOPHEN 325 MG PO TABS: 650.0000 mg | ORAL_TABLET | Freq: Four times a day (QID) | ORAL | Status: DC | PRN

## 2024-02-11 MED ORDER — INSULIN ASPART 100 UNIT/ML IJ SOLN: 0.0000 [IU] | Freq: Three times a day (TID) | INTRAMUSCULAR | Status: DC

## 2024-02-11 MED ORDER — NITROGLYCERIN 2 % TD OINT: 1.0000 [in_us] | TOPICAL_OINTMENT | Freq: Four times a day (QID) | TRANSDERMAL | Status: DC | PRN

## 2024-02-11 MED ORDER — ALUM & MAG HYDROXIDE-SIMETH 200-200-20 MG/5ML PO SUSP
30.0000 mL | Freq: Once | ORAL | Status: AC
Start: 2024-02-11 — End: 2024-02-11
  Administered 2024-02-11: 30 mL via ORAL
  Filled 2024-02-11: qty 30

## 2024-02-11 MED ORDER — ACETAMINOPHEN 650 MG RE SUPP: 650.0000 mg | Freq: Four times a day (QID) | RECTAL | Status: DC | PRN

## 2024-02-11 MED ORDER — ONDANSETRON HCL 4 MG PO TABS: 4.0000 mg | ORAL_TABLET | Freq: Four times a day (QID) | ORAL | Status: DC | PRN

## 2024-02-11 MED ORDER — ONDANSETRON HCL 4 MG/2ML IJ SOLN
4.0000 mg | Freq: Four times a day (QID) | INTRAMUSCULAR | Status: DC | PRN
  Administered 2024-02-12: 4 mg via INTRAVENOUS
  Filled 2024-02-11: qty 2

## 2024-02-11 MED ORDER — MORPHINE SULFATE (PF) 4 MG/ML IV SOLN
4.0000 mg | Freq: Once | INTRAVENOUS | Status: AC
  Administered 2024-02-11: 4 mg via INTRAVENOUS
  Filled 2024-02-11: qty 1

## 2024-02-11 MED ORDER — FENTANYL CITRATE PF 50 MCG/ML IJ SOSY
25.0000 ug | PREFILLED_SYRINGE | INTRAMUSCULAR | Status: DC | PRN
  Administered 2024-02-12: 25 ug via INTRAVENOUS
  Filled 2024-02-11: qty 1

## 2024-02-11 MED ORDER — LIDOCAINE 5 % EX PTCH
1.0000 | MEDICATED_PATCH | CUTANEOUS | Status: DC
  Administered 2024-02-11: 1 via TRANSDERMAL
  Filled 2024-02-11: qty 1
  Filled 2024-02-11: qty 2

## 2024-02-11 NOTE — H&P (Addendum)
 History and Physical   [name] FMW:[id number] DOB: May 03, 1996 DOA: 02/11/2024  PCP: Jonathan Domino, MD  Patient coming from: Home  I have personally briefly reviewed patient's old medical records in [facility] EMR.  Chief Concern: Generalized abdominal pain  HPI: Jonathan Mcmillan is a 28 year old male with history of heart failure reduced ejection fraction, paroxysmal atrial fibrillation, non-insulin -dependent diabetes mellitus type 1, history of PEA arrest, end-stage renal disease on hemodialysis, who presents ED for chief concern of generalized abdominal pain for 4 days.  Vitals in the ED showed t 98.4, rr of 17, heart rate 89, blood pressure 152/88, SpO2 100% room air.  Serum sodium is 142, potassium 3.7, chloride 96, bicarb 28, BUN of 13, serum creatinine 5.26, eGFR 14, nonfasting blood glucose 87, WBC 5.0, hemoglobin 10, platelets of 223.  HS troponin is 38 and on repeat is 39.  Lipase is 27.  ED treatment: Morphine  4 mg IV one-time dose, Protonix  40 mg IV one-time dose, Maalox. --------------------------------- At bedside, patient able to tell me his name, age, current location, current calendar year. He reprots he has been having right upper quadrant pain, right sided chest pain for about 4 days. He denies trauma to his person. He denies diarrhea. He no longer makes urine.  He denies cough, fever, chills. He describes the pain as dull, continuous, 8/10.   Social history: He lives with his aunt. He smokes tobacco, 2 cigarettes. He denies etoh use. He smokes marijuana. He does not work.  ROS: Constitutional: no weight change, no fever ENT/Mouth: no sore throat, no rhinorrhea Eyes: no eye pain, no vision changes Cardiovascular: + right chest pain, no dyspnea,  no edema, no palpitations Respiratory: no cough, no sputum, no wheezing Gastrointestinal: no nausea, no vomiting, no diarrhea, no constipation Genitourinary: no urinary incontinence, no dysuria, no  hematuria Musculoskeletal: no arthralgias, no myalgias Skin: no skin lesions, no pruritus, Neuro: + weakness, no loss of consciousness, no syncope Psych: no anxiety, no depression, + decrease appetite Heme/Lymph: no bruising, no bleeding  ED Course: Discussed with EDP, patient required hospitalization for chief concerns of epigastric pain, with history of PEA arrest, and elevated high sensitive troponin.  Assessment/Plan  Principal Problem:   Acute epigastric pain Active Problems:   Type 1 diabetes mellitus with complication (HCC)   Paroxysmal atrial fibrillation (HCC)   Gastroparesis   Debility   Depression with anxiety   Anemia in ESRD (end-stage renal disease) (HCC)   Protein-calorie malnutrition, moderate   Polysubstance abuse (HCC)   Serum total bilirubin elevated   ESRD on hemodialysis (HCC)   Assessment and Plan:  * Acute epigastric pain Differentials include gastroenteritis, gastroparesis, cardiac related pain, noncardiac related pain (musculoskeletal), as needed nitroglycerin  ointment for chest pain, epigastric pain ordered High suspicion for MSK as the pain is reproducible with palpation Cardiology has been consulted in setting of history of PEA arrest Complete echo has been ordered pending read Strict I's and O's Check respiratory panel for COVID/influenza A/influenza B/RSV PCR Check portable chest x-ray Symptomatic support: 25 mcg IV every 4 hours as needed for moderate and severe pain, 20 hours of coverage ordered Lidocaine  patch, 1-2 patch to the right side of his chest and right upper quadrant abdominal area, 3 days ordered  Type 1 diabetes mellitus with complication (HCC) Insulin  SSI with at bedtime coverage, end-stage renal disease dosing ordered on admission Goal inpatient blood glucose levels 140-180  Anemia in ESRD (end-stage renal disease) (HCC) Patient's hemoglobin range has been 10.7-12.2  ESRD on hemodialysis [facility]) Nephrology has been consulted  via epic order  Serum total bilirubin elevated Right upper quadrant ultrasound was read as no definite abnormality seen in right upper quadrant  Polysubstance abuse (HCC) UDS considered, however, he is anuric.  Chart reviewed.   DVT prophylaxis: Heparin  5000 units subcutaneous every 8 hours Code Status: full code   Diet: Renal/carb modified Family Communication: patient states aunt knows he is here.   Disposition Plan: Clinical course, pending complete echo Consults called: Nephrology, cardiology Admission status: Telemetry cardiac, observation  Past Medical History:  Diagnosis Date   Anemia of chronic renal failure    Anoxic encephalopathy (HCC)    Anxiety    Aortic atherosclerosis    Cannabinoid hyperemesis syndrome    Cardiac arrest (HCC) 06/01/2021   a.) PEA arrest in the setting of metabolic derangements; required intubation and CRRT.   Cardiomyopathy (HCC) 06/01/2021   a.) TTE 06/01/2021: EF 35-40%.   CHF (congestive heart failure) (HCC)    a.) TTE 06/01/2021: EF 35-40%; global HK, mild LA enlargement; GLS -11.3%; ; mild MR/AR; G2DD. b.) TTE 06/05/2021: EF 35-40%; PASP 31.6 mmHg; mild MR, mild-mod TR.   COVID-19 05/22/2020   Depression    Diabetes 1.5, managed as type 1 (HCC)    DKA (diabetic ketoacidoses) 05/21/2016   ESRD (end stage renal disease) on dialysis (HCC)    Gastroparesis    GERD (gastroesophageal reflux disease)    History of 2019 novel coronavirus disease (COVID-19) 05/22/2021   HTN (hypertension)    Intractable nausea and vomiting 04/30/2022   Marijuana use    Nicotine  dependence    PAF (paroxysmal atrial fibrillation) (HCC)    a.) CHA2DS2-VASc = 4 (CHF, HTN, aortic plaque, T1DM). b.) rate/rhythm maintained on oral carvedilol  + amiodarone ; no chronic anticoagulation due to anemia and thrombocytopenia   Perirectal abscess 06/16/2017   Right arm cellulitis 01/26/2018   Thrombocytopenia    Past Surgical History:  Procedure Laterality Date    AMPUTATION TOE Right 08/25/2020   partial 5th toe   AV FISTULA PLACEMENT Left 09/26/2021   Procedure: INSERTION OF ARTERIOVENOUS (AV) GORE-TEX GRAFT ARM (BRACHIAL AXILLARY );  Surgeon: Marea Selinda RAMAN, MD;  Location: [facility] ORS;  Service: Vascular;  Laterality: Left;   COLONOSCOPY WITH PROPOFOL  N/A 06/21/2021   Procedure: COLONOSCOPY WITH PROPOFOL ;  Surgeon: Unk Corinn Skiff, MD;  Location: [facility];  Service: Gastroenterology;  Laterality: N/A;   DEBRIDEMENT  FOOT Right 11/01/2019   wound   DIALYSIS/PERMA CATHETER INSERTION N/A 06/15/2021   Procedure: DIALYSIS/PERMA CATHETER INSERTION;  Surgeon: Marea Selinda RAMAN, MD;  Location: [facility];  Service: Cardiovascular;  Laterality: N/A;   DIALYSIS/PERMA CATHETER INSERTION N/A 06/19/2021   Procedure: DIALYSIS/PERMA CATHETER INSERTION;  Surgeon: Marea Selinda RAMAN, MD;  Location: [facility];  Service: Cardiovascular;  Laterality: N/A;   DIALYSIS/PERMA CATHETER REMOVAL N/A 11/21/2021   Procedure: DIALYSIS/PERMA CATHETER REMOVAL;  Surgeon: Jama Cordella MATSU, MD;  Location: [facility];  Service: Cardiovascular;  Laterality: N/A;   INCISION AND DRAINAGE PERIRECTAL ABSCESS N/A 06/16/2017   Procedure: IRRIGATION AND DEBRIDEMENT PERIRECTAL ABSCESS;  Surgeon: Wonda Charlie BRAVO, MD;  Location: [facility];  Service: General;  Laterality: N/A;   none     Social History:  reports that he has been smoking cigarettes. He has never used smokeless tobacco. He reports current drug use. Drug: Marijuana. He reports that he does not drink alcohol .  No Known Allergies Family History  Problem Relation Age of Onset  Diabetes Mother    Family history: Family history reviewed and not pertinent.  Prior to Admission medications   Medication Sig Start Date End Date Taking? Authorizing Provider  Accu-Chek Softclix Lancets lancets Use aas directed up to 4 times per day. 06/29/21   Kirsteins, Prentice BRAVO, MD  acetaminophen  (TYLENOL ) 325 MG tablet Take  1-2 tablets (325-650 mg total) by mouth every 4 (four) hours as needed for mild pain. Patient not taking: Reported on 04/30/2022 06/28/21   Setzer, Sandra J, PA-C  Blood Glucose Monitoring Suppl (BLOOD GLUCOSE MONITOR SYSTEM) w/Device KIT Use as directed up to 4 times per day. 06/29/21   Setzer, Sandra J, PA-C  calcitRIOL  (ROCALTROL ) 0.5 MCG capsule Take 1 capsule (0.5 mcg total) by mouth daily. Patient not taking: Reported on 04/30/2022 06/28/21   Setzer, Sandra J, PA-C  calcium  acetate (PHOSLO ) 667 MG capsule Take 2,001 mg by mouth 3 (three) times daily. Patient not taking: Reported on 04/30/2022 02/21/22   [provider]  calcium  carbonate (TUMS - DOSED IN MG ELEMENTAL CALCIUM ) 500 MG chewable tablet Chew 1 tablet (200 mg of elemental calcium  total) by mouth 3 (three) times daily. Patient not taking: Reported on 04/30/2022 06/28/21   Setzer, Sandra J, PA-C  carvedilol  (COREG ) 3.125 MG tablet Take 1 tablet (3.125 mg total) by mouth 2 (two) times daily with a meal. Patient not taking: Reported on 04/30/2022 06/28/21   Setzer, Sandra J, PA-C  cinacalcet  (SENSIPAR ) 30 MG tablet Take 1 tablet (30 mg total) by mouth daily with breakfast. Patient not taking: Reported on 04/30/2022 06/29/21   Setzer, Sandra J, PA-C  collagenase  (SANTYL ) ointment Apply topically daily. Patient not taking: Reported on 04/30/2022 06/29/21   Rosendo Nena PARAS, PA-C  Darbepoetin Alfa  (ARANESP ) 60 MCG/0.3ML SOSY injection Inject 0.3 mLs (60 mcg total) into the vein every Friday with hemodialysis. Patient not taking: Reported on 04/30/2022 06/30/21   Rosendo, Sandra J, PA-C  ferrous sulfate  325 (65 FE) MG tablet Take 1 tablet (325 mg total) by mouth daily with breakfast. Patient not taking: Reported on 04/30/2022 06/28/21   Setzer, Sandra J, PA-C  glucose blood test strip Use as directed 06/29/21   Kirsteins, Andrew E, MD  hydrocerin (EUCERIN) CREA Apply 1 application topically 2 (two) times daily. Patient not taking:  Reported on 04/30/2022 06/28/21   Setzer, Sandra J, PA-C  insulin  aspart (NOVOLOG ) 100 UNIT/ML injection Inject 0-15 Units into the skin 3 (three) times daily with meals. For blood glucose to 70-150 give 0 units; 151 to 200 give 1 unit; 201 to 250 give 2 units, 251-300 give 3 units. 301 or greater, call primary care physician.. Patient not taking: Reported on 04/30/2022 06/28/21   Setzer, Sandra J, PA-C  Insulin  Pen Needle 32G X 4 MM MISC Use as directed 06/29/21   Carilyn Prentice BRAVO, MD  lidocaine -prilocaine  (EMLA ) cream Apply 1 Application topically once. Patient not taking: Reported on 04/30/2022 11/20/21   [provider]  lipase/protease/amylase (CREON ) 36000 UNITS CPEP capsule Take 1 capsule (36,000 Units total) by mouth 3 (three) times daily with meals. Patient not taking: Reported on 04/30/2022 06/28/21   Setzer, Sandra J, PA-C  loperamide  (IMODIUM ) 2 MG capsule Take 1 capsule (2 mg total) by mouth 2 (two) times daily. Patient not taking: Reported on 04/30/2022 06/28/21   Setzer, Sandra J, PA-C  Nutritional Supplements (,FEEDING SUPPLEMENT, PROSOURCE PLUS) liquid Take 30 mLs by mouth 3 (three) times daily between meals. 06/28/21   Setzer, Sandra J, PA-C  omeprazole  (PRILOSEC)  40 MG capsule Take 1 capsule (40 mg total) by mouth daily. 08/23/22 10/22/22  Cyrena Mylar, MD  sacubitril -valsartan  (ENTRESTO ) 49-51 MG Take 1 tablet by mouth 2 (two) times daily. Patient not taking: Reported on 04/30/2022 09/05/21   Darliss Rogue, MD  sucralfate  (CARAFATE ) 1 g tablet Take 1 tablet (1 g total) by mouth 4 (four) times daily. Patient not taking: Reported on 06/26/2019 02/04/19 01/02/20  Floy Roberts, MD   Physical Exam: Vitals:   02/11/24 1905 02/11/24 1930 02/11/24 2000 02/11/24 2100  BP:  (!) 164/100 (!) 144/91 (!) 143/86  Pulse:  95 91 88  Resp:  (!) 25 17 15   Temp: 98.4 F (36.9 C)   98.1 F (36.7 C)  TempSrc: Oral   Oral  SpO2:  100% 100% 98%  Weight:      Height:        Constitutional: appears age appropriate, chronically ill, calm Eyes: PERRL, lids and conjunctivae normal ENMT: Mucous membranes are moist. Posterior pharynx clear of any exudate or lesions. Age-appropriate dentition. Hearing appropriate Neck: normal, supple, no masses, no thyromegaly Respiratory: clear to auscultation bilaterally, no wheezing, no crackles. Normal respiratory effort. No accessory muscle use.  Cardiovascular: Regular rate and rhythm, no murmurs / rubs / gallops. No extremity edema. 2+ pedal pulses. No carotid bruits.  Abdomen: no tenderness, no masses palpated, no hepatosplenomegaly. Bowel sounds positive.  Musculoskeletal: no clubbing / cyanosis. No joint deformity upper and lower extremities. Good ROM, no contractures, no atrophy. Normal muscle tone.  Skin: no rashes, lesions, ulcers. No induration Neurologic: Sensation intact. Strength 5/5 in all 4.  Psychiatric: Normal judgment and insight. Alert and oriented x 3. Normal mood.   EKG: independently reviewed, showing sinus rhythm with rate of 91, QTc 504, anteroseptal ST elevation and T wave inversion in the inferior and lateral leads, LVH  Chest x-ray on Admission: I personally reviewed and I agree with radiologist reading as below.  DG Chest Port 1 View Result Date: 02/11/2024 CLINICAL DATA:  Nausea and vomiting. EXAM: PORTABLE CHEST 1 VIEW COMPARISON:  August 12, 2022 FINDINGS: The heart size and mediastinal contours are within normal limits. Both lungs are clear. Chronic seventh and eighth right rib fractures are seen. The visualized skeletal structures are unremarkable. IMPRESSION: No active cardiopulmonary disease. Electronically Signed   By: Suzen Dials M.D.   On: 02/11/2024 21:26   US  ABDOMEN LIMITED RUQ (LIVER/GB) Result Date: 02/11/2024 CLINICAL DATA:  Transaminitis, acute abdominal pain. EXAM: ULTRASOUND ABDOMEN LIMITED RIGHT UPPER QUADRANT COMPARISON:  August 23, 2022. FINDINGS: Gallbladder: No gallstones or  wall thickening visualized. No sonographic Murphy sign noted by sonographer. Common bile duct: Diameter: 5 mm which is within normal limits. Liver: No focal lesion identified. Within normal limits in parenchymal echogenicity. Portal vein is patent on color Doppler imaging with normal direction of blood flow towards the liver. Other: None. IMPRESSION: No definite abnormality seen in the right upper quadrant of the abdomen. Electronically Signed   By: Lynwood Landy Raddle M.D.   On: 02/11/2024 16:28   Labs on Admission: I have personally reviewed following labs  CBC: Recent Labs  Lab 02/11/24 1419  WBC 5.0  HGB 10.0*  HCT 31.3*  MCV 92.1  PLT 223   Basic Metabolic Panel: Recent Labs  Lab 02/11/24 1419  NA 142  K 3.7  CL 96*  CO2 28  GLUCOSE 87  BUN 13  CREATININE 5.26*  CALCIUM  8.9   GFR: Estimated Creatinine Clearance: 20.5 mL/min (A) (by C-G formula  based on SCr of 5.26 mg/dL (H)).  Liver Function Tests: Recent Labs  Lab 02/11/24 1419  AST 11*  ALT 11  ALKPHOS 79  BILITOT 2.0*  PROT 7.8  ALBUMIN 4.1   Recent Labs  Lab 02/11/24 1419  LIPASE 27   Urine analysis:    Component Value Date/Time   COLORURINE AMBER (A) 06/01/2021 1023   APPEARANCEUR TURBID (A) 06/01/2021 1023   LABSPEC 1.017 06/01/2021 1023   PHURINE 6.0 06/01/2021 1023   GLUCOSEU 50 (A) 06/01/2021 1023   HGBUR MODERATE (A) 06/01/2021 1023   BILIRUBINUR NEGATIVE 06/01/2021 1023   KETONESUR NEGATIVE 06/01/2021 1023   PROTEINUR >=300 (A) 06/01/2021 1023   UROBILINOGEN 0.2 05/10/2007 2155   NITRITE NEGATIVE 06/01/2021 1023   LEUKOCYTESUR LARGE (A) 06/01/2021 1023   This document was prepared using Dragon Voice Recognition software and may include unintentional dictation errors.  Dr. Sherre [facility]  If 7PM-7AM, please contact overnight-coverage provider If 7AM-7PM, please contact day attending provider [email or web address]  02/11/2024, 9:33 PM

## 2024-02-11 NOTE — Assessment & Plan Note (Signed)
 Insulin  SSI with at bedtime coverage, end-stage renal disease dosing ordered on admission Goal inpatient blood glucose levels 140-180

## 2024-02-11 NOTE — ED Triage Notes (Addendum)
 Documented on wrong chart. Patient does not have eye pain.

## 2024-02-11 NOTE — ED Triage Notes (Signed)
C/O abd pain

## 2024-02-11 NOTE — Assessment & Plan Note (Signed)
 Patient's hemoglobin range has been 10.7-12.2

## 2024-02-11 NOTE — Hospital Course (Signed)
 [name] is a 28 year old male with history of heart failure reduced ejection fraction, paroxysmal atrial fibrillation, non-insulin -dependent diabetes mellitus type 1, history of PEA arrest, end-stage renal disease on hemodialysis, who presents ED for chief concern of generalized abdominal pain for 4 days.  Vitals in the ED showed t 98.4, rr of 17, heart rate 89, blood pressure 152/88, SpO2 100% room air.  Serum sodium is 142, potassium 3.7, chloride 96, bicarb 28, BUN of 13, serum creatinine 5.26, eGFR 14, nonfasting blood glucose 87, WBC 5.0, hemoglobin 10, platelets of 223.  HS troponin is 38 and on repeat is 39.  Lipase is 27.  ED treatment: Morphine  4 mg IV one-time dose, Protonix  40 mg IV one-time dose, Maalox.

## 2024-02-11 NOTE — Assessment & Plan Note (Addendum)
 UDS considered, however, he is anuric.

## 2024-02-11 NOTE — ED Provider Notes (Signed)
-----------------------------------------   8:54 PM on 02/11/2024 -----------------------------------------  I took over care of this patient from Dr. Waymond.  The ultrasound was negative.  Repeat troponin was also negative.  The patient was comfortable for several hours although started having relatively intense pain again.  Based on cardiology recommendations we will admit for further monitoring as well as formal echocardiogram.  The patient is in agreement with this plan.  I consulted Dr. Sherre from the hospitalist service; based on our discussion she agrees to evaluate the patient for admission.   Jacolyn Pae, MD 02/11/24 2055

## 2024-02-11 NOTE — ED Notes (Signed)
 Echo at bedside

## 2024-02-11 NOTE — ED Notes (Signed)
 Cardiology at bedside.

## 2024-02-11 NOTE — ED Triage Notes (Signed)
 Patient to ED via POV for generalized abd pain with vomiting. Ongoing x4 days. PT states he just finished a full dialysis tx. Hx diabetes

## 2024-02-11 NOTE — Assessment & Plan Note (Signed)
 Right upper quadrant ultrasound was read as no definite abnormality seen in right upper quadrant

## 2024-02-11 NOTE — Consult Note (Signed)
 Cardiology Consultation:   Patient ID: LEVELL TAVANO; [id number]; 03-Dec-1995   Admit date: 02/11/2024 Date of Consult: 02/11/2024  Primary Care Provider: Tobie Domino, MD Primary Cardiologist: Darliss Primary Electrophysiologist:  Cindie   Patient Profile:   Jonathan Mcmillan is a 28 y.o. male with a hx of PEA arrest in the setting of metabolic derangement, HFrEF, PAF, ESRD on HD, type I diabetes complicated by DKA, anemia of chronic disease, thrombocytopenia, tobacco use, and GERD who is being seen today for the evaluation of abnormal EKG at the request of Dr. Waymond.  History of Present Illness:   Mr. Bonaventure was previously evaluated by Dr. Fernand in 2023.  He was admitted to the hospital in 05/2021 with PEA arrest felt to be secondary to metabolic derangement requiring CRRT.  During the admission he developed A-fib and was started on amiodarone  with conversion to sinus rhythm.  Anticoagulation was not administered due to significant anemia.  Echo in 05/2021 showed an EF of 35 to 40%, global hypokinesis, moderate LVH, grade 2 diastolic dysfunction, normal RV systolic function, ventricular, and moderately elevated RVSP estimated at 46.4 mmHg, mildly dilated left atrium, mild mitral regurgitation, and an estimated right atrial pressure of 15 mmHg.  Ischemic evaluation was not pursued.  Repeat echo in 05/2021 showed a persistent cardiomyopathy with an EF of 35 to 40%, global hypokinesis, improved RVSP estimated at 31.6 mmHg, mild mitral regurgitation, mild to moderate tricuspid regurgitation, and mild aortic insufficiency.  He initially and was last seen by Dr. Darliss in 08/2021 with recommendation to continue GDMT including Entresto  and carvedilol .  It is unclear if the patient ever underwent ischemic evaluation.  He was evaluated by EP in 10/2021 with recommendation to defer anticoagulation, stop amiodarone  (he was not taking it regularly), and patient was not a candidate for left atrial appendage  occluder device.  He has not been followed by cardiology since.  Patient presented to [facility] ED on 02/11/2024 after undergoing outpatient hemodialysis with reports of abdominal pain, nausea, and vomiting for 4 days.  In the setting he has a diminished appetite.  He has never been with symptoms of chest pain or dyspnea.  No palpitations, dizziness, presyncope, or syncope.  Leading up to his onset of abdominal pain he was in his usual state of health.  Cardiology asked to evaluate urgently for abnormal EKG concerning for LVH with early repull versus anteroseptal STEMI.  Bedside echo performed by interventional cardiology showed no regional wall motion is along the anteroseptal wall with stable to improved LV systolic function, particularly along the anteroseptal region.  Code STEMI was not activated given reassuring echo and lack of angina.   Past Medical History:  Diagnosis Date   Anemia of chronic renal failure    Anoxic encephalopathy (HCC)    Anxiety    Aortic atherosclerosis    Cannabinoid hyperemesis syndrome    Cardiac arrest (HCC) 06/01/2021   a.) PEA arrest in the setting of metabolic derangements; required intubation and CRRT.   Cardiomyopathy (HCC) 06/01/2021   a.) TTE 06/01/2021: EF 35-40%.   CHF (congestive heart failure) (HCC)    a.) TTE 06/01/2021: EF 35-40%; global HK, mild LA enlargement; GLS -11.3%; ; mild MR/AR; G2DD. b.) TTE 06/05/2021: EF 35-40%; PASP 31.6 mmHg; mild MR, mild-mod TR.   COVID-19 05/22/2020   Depression    Diabetes 1.5, managed as type 1 (HCC)    DKA (diabetic ketoacidoses) 05/21/2016   ESRD (end stage renal disease) on dialysis (HCC)  Gastroparesis    GERD (gastroesophageal reflux disease)    History of 2019 novel coronavirus disease (COVID-19) 05/22/2021   HTN (hypertension)    Intractable nausea and vomiting 04/30/2022   Marijuana use    Nicotine  dependence    PAF (paroxysmal atrial fibrillation) (HCC)    a.) CHA2DS2-VASc = 4 (CHF, HTN, aortic  plaque, T1DM). b.) rate/rhythm maintained on oral carvedilol  + amiodarone ; no chronic anticoagulation due to anemia and thrombocytopenia   Perirectal abscess 06/16/2017   Right arm cellulitis 01/26/2018   Thrombocytopenia     Past Surgical History:  Procedure Laterality Date   AMPUTATION TOE Right 08/25/2020   partial 5th toe   AV FISTULA PLACEMENT Left 09/26/2021   Procedure: INSERTION OF ARTERIOVENOUS (AV) GORE-TEX GRAFT ARM (BRACHIAL AXILLARY );  Surgeon: Marea Selinda RAMAN, MD;  Location: [facility] ORS;  Service: Vascular;  Laterality: Left;   COLONOSCOPY WITH PROPOFOL  N/A 06/21/2021   Procedure: COLONOSCOPY WITH PROPOFOL ;  Surgeon: Unk Corinn Skiff, MD;  Location: [facility] ENDOSCOPY;  Service: Gastroenterology;  Laterality: N/A;   DEBRIDEMENT  FOOT Right 11/01/2019   wound   DIALYSIS/PERMA CATHETER INSERTION N/A 06/15/2021   Procedure: DIALYSIS/PERMA CATHETER INSERTION;  Surgeon: Marea Selinda RAMAN, MD;  Location: [facility] IN[facility];  Service: Cardiovascular;  Laterality: N/A;   DIALYSIS/PERMA CATHETER INSERTION N/A 06/19/2021   Procedure: DIALYSIS/PERMA CATHETER INSERTION;  Surgeon: Marea Selinda RAMAN, MD;  Location: [facility] CV [facility];  Service: Cardiovascular;  Laterality: N/A;   DIALYSIS/PERMA CATHETER REMOVAL N/A 11/21/2021   Procedure: DIALYSIS/PERMA CATHETER REMOVAL;  Surgeon: Jama Cordella MATSU, MD;  Location: [facility];  Service: Cardiovascular;  Laterality: N/A;   INCISION AND DRAINAGE PERIRECTAL ABSCESS N/A 06/16/2017   Procedure: IRRIGATION AND DEBRIDEMENT PERIRECTAL ABSCESS;  Surgeon: Wonda Charlie BRAVO, MD;  Location: [facility] ORS;  Service: General;  Laterality: N/A;   none       Home Meds: Prior to Admission medications   Medication Sig Start Date End Date Taking? Authorizing Provider  Accu-Chek Softclix Lancets lancets Use aas directed up to 4 times per day. 06/29/21   Kirsteins, Prentice BRAVO, MD  acetaminophen  (TYLENOL ) 325 MG tablet Take 1-2 tablets (325-650 mg total) by mouth every  4 (four) hours as needed for mild pain. Patient not taking: Reported on 04/30/2022 06/28/21   Setzer, Sandra J, PA-C  Blood Glucose Monitoring Suppl (BLOOD GLUCOSE MONITOR SYSTEM) w/Device KIT Use as directed up to 4 times per day. 06/29/21   Setzer, Sandra J, PA-C  calcitRIOL  (ROCALTROL ) 0.5 MCG capsule Take 1 capsule (0.5 mcg total) by mouth daily. Patient not taking: Reported on 04/30/2022 06/28/21   Setzer, Sandra J, PA-C  calcium  acetate (PHOSLO ) 667 MG capsule Take 2,001 mg by mouth 3 (three) times daily. Patient not taking: Reported on 04/30/2022 02/21/22   [provider]  calcium  carbonate (TUMS - DOSED IN MG ELEMENTAL CALCIUM ) 500 MG chewable tablet Chew 1 tablet (200 mg of elemental calcium  total) by mouth 3 (three) times daily. Patient not taking: Reported on 04/30/2022 06/28/21   Setzer, Sandra J, PA-C  carvedilol  (COREG ) 3.125 MG tablet Take 1 tablet (3.125 mg total) by mouth 2 (two) times daily with a meal. Patient not taking: Reported on 04/30/2022 06/28/21   Setzer, Sandra J, PA-C  cinacalcet  (SENSIPAR ) 30 MG tablet Take 1 tablet (30 mg total) by mouth daily with breakfast. Patient not taking: Reported on 04/30/2022 06/29/21   Setzer, Sandra J, PA-C  collagenase  (SANTYL ) ointment Apply topically daily. Patient not taking: Reported on 04/30/2022  06/29/21   Setzer, Sandra J, PA-C  Darbepoetin Alfa  (ARANESP ) 60 MCG/0.3ML SOSY injection Inject 0.3 mLs (60 mcg total) into the vein every Friday with hemodialysis. Patient not taking: Reported on 04/30/2022 06/30/21   Rosendo, Sandra J, PA-C  ferrous sulfate  325 (65 FE) MG tablet Take 1 tablet (325 mg total) by mouth daily with breakfast. Patient not taking: Reported on 04/30/2022 06/28/21   Setzer, Sandra J, PA-C  glucose blood test strip Use as directed 06/29/21   Kirsteins, Andrew E, MD  hydrocerin (EUCERIN) CREA Apply 1 application topically 2 (two) times daily. Patient not taking: Reported on 04/30/2022 06/28/21   Setzer, Sandra J,  PA-C  insulin  aspart (NOVOLOG ) 100 UNIT/ML injection Inject 0-15 Units into the skin 3 (three) times daily with meals. For blood glucose to 70-150 give 0 units; 151 to 200 give 1 unit; 201 to 250 give 2 units, 251-300 give 3 units. 301 or greater, call primary care physician.. Patient not taking: Reported on 04/30/2022 06/28/21   Setzer, Sandra J, PA-C  Insulin  Pen Needle 32G X 4 MM MISC Use as directed 06/29/21   Carilyn Prentice BRAVO, MD  lidocaine -prilocaine  (EMLA ) cream Apply 1 Application topically once. Patient not taking: Reported on 04/30/2022 11/20/21   [provider]  lipase/protease/amylase (CREON ) 36000 UNITS CPEP capsule Take 1 capsule (36,000 Units total) by mouth 3 (three) times daily with meals. Patient not taking: Reported on 04/30/2022 06/28/21   Setzer, Sandra J, PA-C  loperamide  (IMODIUM ) 2 MG capsule Take 1 capsule (2 mg total) by mouth 2 (two) times daily. Patient not taking: Reported on 04/30/2022 06/28/21   Setzer, Sandra J, PA-C  Nutritional Supplements (,FEEDING SUPPLEMENT, PROSOURCE PLUS) liquid Take 30 mLs by mouth 3 (three) times daily between meals. 06/28/21   Setzer, Sandra J, PA-C  omeprazole  (PRILOSEC) 40 MG capsule Take 1 capsule (40 mg total) by mouth daily. 08/23/22 10/22/22  Cyrena Mylar, MD  sacubitril -valsartan  (ENTRESTO ) 49-51 MG Take 1 tablet by mouth 2 (two) times daily. Patient not taking: Reported on 04/30/2022 09/05/21   Darliss Rogue, MD  sucralfate  (CARAFATE ) 1 g tablet Take 1 tablet (1 g total) by mouth 4 (four) times daily. Patient not taking: Reported on 06/26/2019 02/04/19 01/02/20  Floy Roberts, MD    Inpatient Medications: Scheduled Meds:  Continuous Infusions:  PRN Meds:   Allergies:  No Known Allergies  Social History:   Social History   Socioeconomic History   Marital status: Single    Spouse name: Not on file   Number of children: 0   Years of education: Not on file   Highest education level: Not on file  Occupational  History   Occupation: unemployed  Tobacco Use   Smoking status: Every Day    Current packs/day: 0.50    Types: Cigarettes   Smokeless tobacco: Never  Vaping Use   Vaping status: Never Used  Substance and Sexual Activity   Alcohol  use: No   Drug use: Yes    Types: Marijuana    Comment: + cocaine UDS 08/25/2020; negatives after   Sexual activity: Not on file  Other Topics Concern   Not on file  Social History Narrative   Lives with aunt   Social Drivers of Health   Financial Resource Strain: Low Risk  (02/19/2023)   Received from [facility]   Overall Financial Resource Strain (CARDIA)    Difficulty of Paying Living Expenses: Not hard at all  Food Insecurity: No Food Insecurity (02/19/2023)   Received from  [facility]   Hunger Vital Sign    Within the past 12 months, you worried that your food would run out before you got the money to buy more.: Never true    Within the past 12 months, the food you bought just didn't last and you didn't have money to get more.: Never true  Transportation Needs: No Transportation Needs (02/19/2023)   Received from [facility] - Transportation    Lack of Transportation (Medical): No    Lack of Transportation (Non-Medical): No  Physical Activity: Insufficiently Active (01/11/2022)   Received from [facility]   Exercise Vital Sign    On average, how many days per week do you engage in moderate to strenuous exercise (like a brisk walk)?: 3 days    On average, how many minutes do you engage in exercise at this level?: 10 min  Stress: Not on file  Social Connections: Socially Isolated (01/11/2022)   Received from [facility]   Social Connection and Isolation Panel    In a typical week, how many times do you talk on the phone with family, friends, or neighbors?: More than three times a week    How often do you get together with friends or relatives?: More than three times a week    How often do you attend church or  religious services?: Never    Do you belong to any clubs or organizations such as church groups, unions, fraternal or athletic groups, or school groups?: No    How often do you attend meetings of the clubs or organizations you belong to?: Never    Are you married, widowed, divorced, separated, never married, or living with a partner?: Never married  Intimate Partner Violence: Not At Risk (05/01/2022)   Humiliation, Afraid, Rape, and Kick questionnaire    Fear of Current or Ex-Partner: No    Emotionally Abused: No    Physically Abused: No    Sexually Abused: No     Family History:   Family History  Problem Relation Age of Onset   Diabetes Mother     ROS:  Review of Systems  Constitutional:  Positive for malaise/fatigue. Negative for chills, diaphoresis, fever and weight loss.       Diminished appetite   HENT:  Negative for congestion.   Eyes:  Negative for discharge and redness.  Respiratory:  Negative for cough, sputum production, shortness of breath and wheezing.   Cardiovascular:  Negative for chest pain, palpitations, orthopnea, claudication, leg swelling and PND.  Gastrointestinal:  Positive for abdominal pain, nausea and vomiting. Negative for heartburn.  Musculoskeletal:  Negative for falls and myalgias.  Skin:  Negative for rash.  Neurological:  Positive for weakness. Negative for dizziness, tingling, tremors, sensory change, speech change, focal weakness and loss of consciousness.  Endo/Heme/Allergies:  Does not bruise/bleed easily.  Psychiatric/Behavioral:  Negative for substance abuse. The patient is not nervous/anxious.   All other systems reviewed and are negative.     Physical Exam/Data:   Vitals:   02/11/24 1416 02/11/24 1418 02/11/24 1420 02/11/24 1444  BP: 130/68     Pulse: 92     Resp: 17     Temp: 98 F (36.7 C)     TempSrc: Oral     SpO2: 100%     Weight: 73.9 kg 73.9 kg 73.9 kg 73.9 kg  Height: 5' 9 (1.753 m)      No intake or output data in the  24 hours ending 02/11/24 1447 Filed Weights   02/11/24 1418 02/11/24 1420 02/11/24 1444  Weight: 73.9 kg 73.9 kg 73.9 kg   Body mass index is 24.06 kg/m.   Physical Exam: General: Well developed, well nourished, in no acute distress. Hiccups.  Head: Normocephalic, atraumatic, sclera non-icteric, no xanthomas, nares without discharge.  Neck: Negative for carotid bruits. JVD not elevated. Lungs: Clear bilaterally to auscultation without wheezes, rales, or rhonchi. Breathing is unlabored. Heart: RRR with S1 S2. No murmurs, rubs, or gallops appreciated. Abdomen: Soft, non-tender, non-distended with normoactive bowel sounds. No hepatomegaly. No rebound/guarding. No obvious abdominal masses. Msk:  Strength and tone appear normal for age. Extremities: No clubbing or cyanosis. No edema. Distal pedal pulses are 2+ and equal bilaterally. Neuro: Alert and oriented X 3. No facial asymmetry. No focal deficit. Moves all extremities spontaneously. Psych:  Responds to questions appropriately with a normal affect.   EKG:  The EKG was personally reviewed and demonstrates: NSR, 91 bpm, LVH with early repolarization abnormalities vs anterior ST elevation with inferolateral TWI Telemetry:  Telemetry was personally reviewed and demonstrates: SR, 80s to 90s bpm  Weights: Filed Weights   02/11/24 1418 02/11/24 1420 02/11/24 1444  Weight: 73.9 kg 73.9 kg 73.9 kg    Relevant CV Studies:  Limited echo 06/05/2021: 1. Left ventricular ejection fraction, by estimation, is 35 to 40%. The  left ventricle has moderately decreased function. The left ventricle  demonstrates global hypokinesis. There is mild left ventricular  hypertrophy. Left ventricular diastolic  parameters are indeterminate.   2. Right ventricular systolic function is mildly reduced. The right  ventricular size is normal. There is normal pulmonary artery systolic  pressure. The estimated right ventricular systolic pressure is 31.6 mmHg.   3.  The mitral valve is normal in structure. Mild mitral valve  regurgitation. No evidence of mitral stenosis.   4. Tricuspid valve regurgitation is mild to moderate.   5. The aortic valve has an indeterminant number of cusps. Aortic valve  regurgitation is mild. No aortic stenosis is present.   6. The inferior vena cava is normal in size with greater than 50%  respiratory variability, suggesting right atrial pressure of 3 mmHg. __________  2D echo 06/01/2021: 1. Left ventricular ejection fraction, by estimation, is 35 to 40%. The  left ventricle has moderately decreased function. The left ventricle  demonstrates global hypokinesis. There is moderate left ventricular  hypertrophy. Left ventricular diastolic  parameters are consistent with Grade II diastolic dysfunction  (pseudonormalization). The average left ventricular global longitudinal  strain is -11.3 %. The global longitudinal strain is abnormal.   2. Right ventricular systolic function is mildly reduced. The right  ventricular size is normal. There is moderately elevated pulmonary artery  systolic pressure. The estimated right ventricular systolic pressure is  46.4 mmHg.   3. Left atrial size was mildly dilated.   4. The mitral valve is normal in structure. Mild mitral valve  regurgitation. No evidence of mitral stenosis.   5. The aortic valve is normal in structure. Aortic valve regurgitation is  mild. No aortic stenosis is present.   6. The inferior vena cava is dilated in size with <50% respiratory  variability, suggesting right atrial pressure of 15 mmHg.   Laboratory Data:  ChemistryNo results for input(s): NA, K, CL, CO2, GLUCOSE, BUN, CREATININE, CALCIUM , GFRNONAA, GFRAA, ANIONGAP in the last 168 hours.  No results for input(s): PROT, ALBUMIN, AST, ALT, ALKPHOS, BILITOT in the last 168 hours. HematologyNo results for input(s):  WBC, RBC, HGB, HCT, MCV, MCH, MCHC, RDW, PLT  in the last 168 hours. Cardiac EnzymesNo results for input(s): TROPONINI in the last 168 hours. No results for input(s): TROPIPOC in the last 168 hours.  BNPNo results for input(s): BNP, PROBNP in the last 168 hours.  DDimer No results for input(s): DDIMER in the last 168 hours.  Radiology/Studies:  No results found.  Assessment and Plan:   1. Abnormal EKG: - Never with chest pain or dyspnea -Presented with abdominal pain, nausea, and vomiting with decreased appetite - Bedside echo performed by interventional cardiology showed stable to possibly slightly improved EF without significant anterior WMA - No evidence of STEMI at this time - Recommend obtaining formal echo and cycling cardiac enzymes  2.  HFrEF with history of PEA arrest: - PEA arrest suspected be in the setting of metabolic derangement - No prior ischemic evaluation available for review, this should be considered moving forward pending clinical course, echo findings, and cardiac enzymes - Continue PTA GDMT as able - Volume management per hemodialysis  2. Abdominal pain with nausea and vomiting: - Management per ER and internal medicine  3.  ESRD on HD with anemia of chronic disease: - Management per nephrology      For questions or updates, please contact [facility] Please consult [email or web address] for contact info under Cardiology/STEMI.   Signed, Bernardino Bring, PA-C [facility] Pager: [phone] 02/11/2024, 2:47 PM

## 2024-02-11 NOTE — Assessment & Plan Note (Addendum)
 Differentials include gastroenteritis, gastroparesis, cardiac related pain, noncardiac related pain (musculoskeletal), as needed nitroglycerin  ointment for chest pain, epigastric pain ordered High suspicion for MSK as the pain is reproducible with palpation Cardiology has been consulted in setting of history of PEA arrest Complete echo has been ordered pending read Strict I's and O's Check respiratory panel for COVID/influenza A/influenza B/RSV PCR Check portable chest x-ray Symptomatic support: 25 mcg IV every 4 hours as needed for moderate and severe pain, 20 hours of coverage ordered Lidocaine  patch, 1-2 patch to the right side of his chest and right upper quadrant abdominal area, 3 days ordered

## 2024-02-11 NOTE — ED Provider Notes (Signed)
 [facility] Provider Note    Event Date/Time   First MD Initiated Contact with Patient 02/11/24 1439     (approximate)   History   Abdominal Pain   HPI  Jonathan Mcmillan is a 28 y.o. male with history of ESRD on dialysis, heart failure with reduced ejection fraction, type 1 diabetes, substance use, presenting with epigastric abdominal pain as well as nausea vomiting for last 4 days.  He does note marijuana use.  No chest pain or shortness of breath, did complete full session of dialysis today without any issues.  He denies any leg swelling.  He does not make any urine anymore.  On independent chart review, he was admitted in June for general body aches as well as abdominal pain with nausea vomiting elevated blood glucose.  Was also found to be febrile at that time.  Had a CT that showed a ruptured appendicitis with an abscess status post IR drainage.     Physical Exam   Triage Vital Signs: ED Triage Vitals [02/11/24 1416]  Encounter Vitals Group     BP 130/68     Girls Systolic BP Percentile      Girls Diastolic BP Percentile      Boys Systolic BP Percentile      Boys Diastolic BP Percentile      Pulse Rate 92     Resp 17     Temp 98 F (36.7 C)     Temp Source Oral     SpO2 100 %     Weight 163 lb (73.9 kg)     Height 5' 9 (1.753 m)     Head Circumference      Peak Flow      Pain Score 7     Pain Loc      Pain Education      Exclude from Growth Chart     Most recent vital signs: Vitals:   02/11/24 1416  BP: 130/68  Pulse: 92  Resp: 17  Temp: 98 F (36.7 C)  SpO2: 100%     General: Awake, no distress.  CV:  Good peripheral perfusion.  Resp:  Normal effort.  Abd:  No distention.  Soft, tender to the epigastric without any guarding Other:  No lower extremity edema, no unilateral calf sign or tenderness   ED Results / Procedures / Treatments   Labs (all labs ordered are listed, but only abnormal results are displayed) Labs  Reviewed  COMPREHENSIVE METABOLIC PANEL WITH GFR - Abnormal; Notable for the following components:      Result Value   Chloride 96 (*)    Creatinine, Ser 5.26 (*)    AST 11 (*)    Total Bilirubin 2.0 (*)    GFR, Estimated 14 (*)    Anion gap 18 (*)    All other components within normal limits  CBC - Abnormal; Notable for the following components:   RBC 3.40 (*)    Hemoglobin 10.0 (*)    HCT 31.3 (*)    All other components within normal limits  TROPONIN I (HIGH SENSITIVITY) - Abnormal; Notable for the following components:   Troponin I (High Sensitivity) 38 (*)    All other components within normal limits  LIPASE, BLOOD  URINALYSIS, ROUTINE W REFLEX MICROSCOPIC     EKG  EKG shows, EKG shows sinus rhythm, rate 91, normal QRS, QTc is 498, does show mild elevations in septal leads with ST depressions to inferior leads,  seems consistent with LVH, this is slightly changed compared to prior  Repeat EKG shows ST elevations, rate 91, normal QRS, QTc is 504, persistent ST depressions with mild ST elevations consistent with LVH, cardiology was consulted, no code STEMI.    PROCEDURES:  Critical Care performed: No  Procedures   MEDICATIONS ORDERED IN ED: Medications  pantoprazole  (PROTONIX ) injection 40 mg (has no administration in time range)  alum & mag hydroxide-simeth (MAALOX/MYLANTA) 200-200-20 MG/5ML suspension 30 mL (30 mLs Oral Given 02/11/24 1505)    And  lidocaine  (XYLOCAINE ) 2 % viscous mouth solution 15 mL (15 mLs Oral Given 02/11/24 1505)     IMPRESSION / MDM / ASSESSMENT AND PLAN / ED COURSE  I reviewed the triage vital signs and the nursing notes.                             Differential diagnosis includes, but is not limited to, atypical ACS, gastritis, GERD, peptic ulcer disease, pancreatitis, cannabinoid hyperemesis, viral illness, gastroenteritis, did consider biliary etiology but patient has no right upper quadrant abdominal pain at this time.  Will get labs,  EKG, troponin, cardiology consult.  GI cocktail.  Patient's presentation is most consistent with acute presentation with potential threat to life or bodily function.  Independent interpretation of labs and imaging below.  Clinical course as below.  Patient signed out pending imaging and labs as well as reassessment, if improving and labs reassuring, able to be discharged with outpatient cardiology follow-up.  The patient is on the cardiac monitor to evaluate for evidence of arrhythmia and/or significant heart rate changes.   Clinical Course as of 02/11/24 1607  Tue Feb 11, 2024  1446 Patient is EKG with new ST changes, depressions with biphasic T waves in inferior leads there are new, does have mild ST elevations to septal leads, could be consistent with LVH with repolarization, did consult cardiology who is down here to see the patient and will do a bedside echo.  Patient has no chest pain at this time, only complaint is epigastric pain with nausea vomiting. [TT]  1453 Cardiology evaluated the patient, did a bedside ultrasound, states no Cath Lab activation at this time, thinks that the EKG changes are due to LVH.  Recommended treating his underlying complaint and to reassess if he starts to have chest pain. [TT]  1528 Independent review of labs, no leukocytosis, electrolytes not severely deranged, creatinine is elevated but he has ESRD on dialysis, his bilirubin is elevated compared to prior, will get a right upper quadrant ultrasound. [TT]  1549 On reassessment patient states that the abdominal pain is not resolved but the GI cocktail did help him a little, will put in for IV Protonix  for him.  Discussed with him about workup so far, offered admission but he declined, would prefer to be discharged if workup is negative and symptoms improved. [TT]    Clinical Course User Index [TT] Waymond, Lorelle Cummins, MD     FINAL CLINICAL IMPRESSION(S) / ED DIAGNOSES   Final diagnoses:  Epigastric pain  Nausea  and vomiting, unspecified vomiting type     Rx / DC Orders   ED Discharge Orders          Ordered    Ambulatory referral to Cardiology       Comments: If you have not heard from the Cardiology office within the next 72 hours please call [phone].   02/11/24 1551  Note:  This document was prepared using Dragon voice recognition software and may include unintentional dictation errors.    Waymond Lorelle Cummins, MD 02/11/24 [phone]

## 2024-02-11 NOTE — Assessment & Plan Note (Signed)
 Nephrology has been consulted via epic order

## 2024-02-11 NOTE — ED Notes (Signed)
 Patient is oliguric. Cannot provide urine sample. Provider aware

## 2024-02-12 ENCOUNTER — Observation Stay

## 2024-02-12 DIAGNOSIS — R1013 Epigastric pain: Secondary | ICD-10-CM | POA: Diagnosis not present

## 2024-02-12 LAB — GLUCOSE, CAPILLARY
Glucose-Capillary: 58 mg/dL — ABNORMAL LOW (ref 70–99)
Glucose-Capillary: 66 mg/dL — ABNORMAL LOW (ref 70–99)
Glucose-Capillary: 75 mg/dL (ref 70–99)
Glucose-Capillary: 130 mg/dL — ABNORMAL HIGH (ref 70–99)
Glucose-Capillary: 118 mg/dL — ABNORMAL HIGH (ref 70–99)

## 2024-02-12 LAB — HEPATIC FUNCTION PANEL
ALT: 9 U/L (ref 0–44)
AST: 11 U/L — ABNORMAL LOW (ref 15–41)
Albumin: 3.9 g/dL (ref 3.5–5.0)
Alkaline Phosphatase: 74 U/L (ref 38–126)
Bilirubin, Direct: 0.2 mg/dL (ref 0.0–0.2)
Indirect Bilirubin: 2.1 mg/dL — ABNORMAL HIGH (ref 0.3–0.9)
Total Bilirubin: 2.3 mg/dL — ABNORMAL HIGH (ref 0.0–1.2)
Total Protein: 7.4 g/dL (ref 6.5–8.1)

## 2024-02-12 LAB — BASIC METABOLIC PANEL WITH GFR
Anion gap: 16 — ABNORMAL HIGH (ref 5–15)
BUN: 22 mg/dL — ABNORMAL HIGH (ref 6–20)
CO2: 30 mmol/L (ref 22–32)
Calcium: 8.9 mg/dL (ref 8.9–10.3)
Chloride: 97 mmol/L — ABNORMAL LOW (ref 98–111)
Creatinine, Ser: 7.58 mg/dL — ABNORMAL HIGH (ref 0.61–1.24)
GFR, Estimated: 9 mL/min — ABNORMAL LOW (ref >60)
Glucose, Bld: 81 mg/dL (ref 70–99)
Potassium: 3.7 mmol/L (ref 3.5–5.1)
Sodium: 143 mmol/L (ref 135–145)

## 2024-02-12 LAB — CBC
HCT: 31.6 % — ABNORMAL LOW (ref 39.0–52.0)
Hemoglobin: 10.1 g/dL — ABNORMAL LOW (ref 13.0–17.0)
MCH: 29.3 pg (ref 26.0–34.0)
MCHC: 32 g/dL (ref 30.0–36.0)
MCV: 91.6 fL (ref 80.0–100.0)
Platelets: 199 K/uL (ref 150–400)
RBC: 3.45 MIL/uL — ABNORMAL LOW (ref 4.22–5.81)
RDW: 14.1 % (ref 11.5–15.5)
WBC: 3.7 K/uL — ABNORMAL LOW (ref 4.0–10.5)
nRBC: 0 % (ref 0.0–0.2)

## 2024-02-12 LAB — HEMOGLOBIN A1C
Hgb A1c MFr Bld: 7.8 % — ABNORMAL HIGH (ref 4.8–5.6)
Mean Plasma Glucose: 177.16 mg/dL

## 2024-02-12 LAB — ECHOCARDIOGRAM COMPLETE
Area-P 1/2: 4.89 cm2
Height: 69 in
P 1/2 time: 232 ms
S' Lateral: 3 cm
Weight: 2606.72 [oz_av]

## 2024-02-12 LAB — HEPATITIS B SURFACE ANTIGEN: Hepatitis B Surface Ag: NONREACTIVE

## 2024-02-12 LAB — MRSA NEXT GEN BY PCR, NASAL: MRSA by PCR Next Gen: NOT DETECTED

## 2024-02-12 MED ORDER — CHLORHEXIDINE GLUCONATE CLOTH 2 % EX PADS
6.0000 | MEDICATED_PAD | Freq: Every day | CUTANEOUS | Status: DC
  Administered 2024-02-12: 6 via TOPICAL

## 2024-02-12 MED ORDER — CARVEDILOL 6.25 MG PO TABS: 6.2500 mg | ORAL_TABLET | Freq: Two times a day (BID) | ORAL | 0 refills | Status: AC

## 2024-02-12 MED ORDER — SENNOSIDES-DOCUSATE SODIUM 8.6-50 MG PO TABS
2.0000 | ORAL_TABLET | Freq: Once | ORAL | Status: AC
  Administered 2024-02-12: 2 via ORAL
  Filled 2024-02-12: qty 2

## 2024-02-12 MED ORDER — MORPHINE SULFATE (PF) 2 MG/ML IV SOLN: 2.0000 mg | INTRAVENOUS | Status: DC | PRN

## 2024-02-12 MED ORDER — LANCET DEVICE MISC
1.0000 | Freq: Three times a day (TID) | 0 refills | Status: AC
Start: 2024-02-12 — End: 2024-03-13

## 2024-02-12 MED ORDER — METOCLOPRAMIDE HCL 10 MG PO TABS
10.0000 mg | ORAL_TABLET | Freq: Three times a day (TID) | ORAL | Status: DC
  Administered 2024-02-12: 10 mg via ORAL
  Filled 2024-02-12: qty 1

## 2024-02-12 MED ORDER — FENTANYL CITRATE PF 50 MCG/ML IJ SOSY
12.5000 ug | PREFILLED_SYRINGE | INTRAMUSCULAR | Status: DC | PRN
  Administered 2024-02-12: 12.5 ug via INTRAVENOUS
  Filled 2024-02-12: qty 1

## 2024-02-12 MED ORDER — LANTUS SOLOSTAR 100 UNIT/ML ~~LOC~~ SOPN: 5.0000 [IU] | PEN_INJECTOR | Freq: Every day | SUBCUTANEOUS | 0 refills | Status: AC

## 2024-02-12 MED ORDER — LANCETS MISC. MISC
1.0000 | Freq: Three times a day (TID) | 0 refills | Status: AC
Start: 2024-02-12 — End: 2024-03-13

## 2024-02-12 MED ORDER — VITAMIN D-1000 MAX ST 25 MCG (1000 UT) PO TABS: 1000.0000 [IU] | ORAL_TABLET | Freq: Every day | ORAL | 0 refills | Status: AC

## 2024-02-12 MED ORDER — ASPIRIN 81 MG PO TBEC: 81.0000 mg | DELAYED_RELEASE_TABLET | Freq: Every day | ORAL | 0 refills | Status: AC

## 2024-02-12 MED ORDER — METOCLOPRAMIDE HCL 10 MG PO TABS: 10.0000 mg | ORAL_TABLET | Freq: Three times a day (TID) | ORAL | 0 refills | Status: AC

## 2024-02-12 MED ORDER — BLOOD GLUCOSE TEST VI STRP: 1.0000 | ORAL_STRIP | Freq: Three times a day (TID) | 0 refills | Status: AC

## 2024-02-12 MED ORDER — POLYETHYLENE GLYCOL 3350 17 G PO PACK
17.0000 g | PACK | Freq: Every day | ORAL | Status: DC
  Administered 2024-02-12: 17 g via ORAL
  Filled 2024-02-12: qty 1

## 2024-02-12 MED ORDER — AMLODIPINE BESYLATE 5 MG PO TABS: 5.0000 mg | ORAL_TABLET | Freq: Every day | ORAL | 0 refills | Status: DC

## 2024-02-12 MED ORDER — CHLORHEXIDINE GLUCONATE CLOTH 2 % EX PADS: 6.0000 | MEDICATED_PAD | Freq: Every day | CUTANEOUS | Status: DC

## 2024-02-12 MED ORDER — DEXTROSE 50 % IV SOLN: 1.0000 | INTRAVENOUS | Status: DC | PRN

## 2024-02-12 MED ORDER — BLOOD GLUCOSE MONITORING SUPPL DEVI: 1.0000 | Freq: Three times a day (TID) | 0 refills | Status: AC

## 2024-02-12 NOTE — Progress Notes (Signed)
 Hypoglycemic Event  CBG: 58  Treatment: 4 oz juice/soda  Symptoms: None  Follow-up CBG: Time:0836 CBG Result:66 Follow up CBG at 0852, result 75   Possible Reasons for Event: Vomiting   Comments/MD notified: Patient had 2 occurrences of emesis overnight.  Patient not currently feeling nauseous, patient requested to drink juice verse IV Dextrose .  Patient stated he felt he could drink juice and not have emesis but does not feel he could eat any solid food at this time.    MD Marsa notified.    Nashonda Limberg A

## 2024-02-12 NOTE — TOC CM/SW Note (Signed)
 Transition of Care [facility]) - Inpatient Brief Assessment   Patient Details  Name: Jonathan Mcmillan MRN: [id number] Date of Birth: 03-Mar-1996  Transition of Care [facility]) CM/SW Contact:    Lauraine JAYSON Carpen, LCSW Phone Number: 02/12/2024, 4:31 PM   Clinical Narrative: Patient has orders to discharge home today. Chart reviewed. No TOC needs identified. CSW signing off.  Transition of Care Asessment: Insurance and Status: Insurance coverage has been reviewed Patient has primary care physician: Yes Home environment has been reviewed: Single family home Prior level of function:: Not documented Prior/Current Home Services: No current home services Social Drivers of Health Review: SDOH reviewed no interventions necessary Readmission risk has been reviewed: Yes Transition of care needs: no transition of care needs at this time

## 2024-02-12 NOTE — Progress Notes (Addendum)
 Unable to complete admission profile. Pt states that he is tired and does not feel good. Refuses dressing change to HD Catheter.

## 2024-02-12 NOTE — Discharge Summary (Signed)
 Physician Discharge Summary   Patient: Jonathan Mcmillan MRN: [id number]  DOB: Mar 13, 1996   Admit:     Date of Admission: 02/11/2024 Admitted from: home   Discharge: Date of discharge: 02/12/24 Disposition: Home Condition at discharge: fair  CODE STATUS: FULL CODE     Discharge Physician: Laneta Blunt, DO [facility]     PCP: Tobie Domino, MD  Recommendations for Outpatient Follow-up:  Follow up with PCP Tobie Domino, MD asap   Discharge Instructions     Ambulatory referral to Cardiology   Complete by: As directed    If you have not heard from the Cardiology office within the next 72 hours please call [phone].   Diet Carb Modified   Complete by: As directed    Increase activity slowly   Complete by: As directed          Discharge Diagnoses: Principal Problem:   Acute epigastric pain Active Problems:   Type 1 diabetes mellitus with complication (HCC)   Paroxysmal atrial fibrillation (HCC)   Gastroparesis   Debility   Depression with anxiety   Anemia in ESRD (end-stage renal disease) (HCC)   Protein-calorie malnutrition, moderate   Polysubstance abuse (HCC)   Serum total bilirubin elevated   ESRD on hemodialysis [facility])      Hospital course / significant events:   HPI: [name] is a 28 year old male with history of heart failure reduced ejection fraction, paroxysmal atrial fibrillation, non-insulin -dependent diabetes mellitus type 1, history of PEA arrest, end-stage renal disease on hemodialysis, who presents ED for chief concern of generalized abdominal pain for 4 days.  09/30: to ED.  Cardiology asked to evaluate urgently for abnormal EKG concerning for LVH with early repull versus anteroseptal STEMI.  Bedside echo performed by interventional cardiology showed no regional wall motion is along the anteroseptal wall with stable to improved LV systolic function, particularly along the anteroseptal region.  Code STEMI was not  activated given reassuring echo and lack of angina. Recommend obtaining formal echo and cycling cardiac enzymes  10/01: echo read as LVEF 50-55, low normal LV fxn, severe LVH. Pt reports constipation and low appetite but otherwise no concerns and wishes to go home. Pt reports has not been on any Rx since released from prison, nurse navigator working on resources but he is insured and agreed to pick up medications outpatient and restart BP and DM meds, new Rx for Reglan  also. Ideally would like to see glc stabilize a bit better and toelrating po but in shared decision making felt better to dc patient w/ Rx than for him to leave AMA with no prescriptions / resources      Consultants:  Cardiology   Procedures/Surgeries: none      ASSESSMENT & PLAN:   Acute epigastric pain Differentials include gastroenteritis, gastroparesis, cardiac related pain, noncardiac related pain (musculoskeletal), as needed nitroglycerin  ointment for chest pain, epigastric pain ordered.  High suspicion for MSK as the pain is reproducible with palpation Cardiology has been consulted in setting of history of PEA arrest - no acute cardiac concerns  Complete echo no concerns Strict I's and O's respiratory panel for COVID/influenza A/influenza B/RSV PCR --> neg portable chest x-ray --> Chronic seventh and eighth right rib fractures are seen, no other findings Stopped IV pain meds Suspect gastroparesis - sent home w/ rx for reglan     Type 1 diabetes mellitus with complication  On no meds outpatient past few weeks since release from prison 09/19 Restart low dose  glargine with slow uptitration  PCP f/u stressed to pt  Ideally would like to see glc stabilize a bit better and toelrating po but in shared decision making felt better to dc patient w/ Rx than for him to leave AMA with no prescriptions / resources   Abnormal EKG HFrEF with history of PEA arrest which was suspected be in the setting of metabolic  derangement LVH No prior ischemic evaluation available for review, this should be considered moving forward / outpatient     Anemia in ESRD (end-stage renal disease)  Patient's hemoglobin range has been 10.7-12.2 Follow CBC   ESRD on hemodialysis  Nephrology follow up    Serum total bilirubin elevated Right upper quadrant ultrasound was read as no definite abnormality seen in right upper quadrant Repeat outpatient    Polysubstance abuse  UDS considered, however, he is anuric.     No concerns based on BMI: Body mass index is 24.06 kg/m.SABRA Significantly low or high BMI is associated with higher medical risk.  Underweight - under 18  overweight - 25 to 29 obese - 30 or more Class 1 obesity: BMI of 30.0 to 34 Class 2 obesity: BMI of 35.0 to 39 Class 3 obesity: BMI of 40.0 to 49 Super Morbid Obesity: BMI 50-59 Super-super Morbid Obesity: BMI 60+ Healthy nutrition and physical activity advised as adjunct to other disease management and risk reduction treatments            Discharge Instructions  Allergies as of 02/12/2024   No Known Allergies      Medication List     STOP taking these medications    (feeding supplement) PROSource Plus liquid   Accu-Chek Guide test strip Generic drug: glucose blood Replaced by: BLOOD GLUCOSE TEST STRIPS Strp   Accu-Chek Guide w/Device Kit Replaced by: Blood Glucose Monitoring Suppl Devi   Accu-Chek Softclix Lancets lancets   BD Pen Needle Nano U/F 32G X 4 MM Misc Generic drug: Insulin  Pen Needle   cloNIDine 0.2 MG tablet Commonly known as: CATAPRES   Entresto  49-51 MG Generic drug: sacubitril -valsartan    hydrALAZINE  100 MG tablet Commonly known as: APRESOLINE    insulin  regular 100 units/mL injection Commonly known as: NOVOLIN R   lactulose (encephalopathy) 10 GM/15ML Soln Commonly known as: CHRONULAC   Lantus  100 UNIT/ML injection Generic drug: insulin  glargine Replaced by: Lantus  SoloStar 100 UNIT/ML  Solostar Pen   lisinopril  40 MG tablet Commonly known as: ZESTRIL    omeprazole  20 MG capsule Commonly known as: PRILOSEC   sevelamer carbonate 800 MG tablet Commonly known as: RENVELA   Vitamin B-Complex Tabs       TAKE these medications    acetaminophen  325 MG tablet Commonly known as: TYLENOL  Take 1-2 tablets (325-650 mg total) by mouth every 4 (four) hours as needed for mild pain.   amLODipine  5 MG tablet Commonly known as: NORVASC  Take 1 tablet (5 mg total) by mouth daily. What changed:  medication strength how much to take when to take this   aspirin EC 81 MG tablet Take 1 tablet (81 mg total) by mouth daily.   Blood Glucose Monitoring Suppl Devi 1 each by Does not apply route in the morning, at noon, and at bedtime. May substitute to any manufacturer covered by patient's insurance. Replaces: Accu-Chek Guide w/Device Kit   BLOOD GLUCOSE TEST STRIPS Strp 1 each by In Vitro route in the morning, at noon, and at bedtime. May substitute to any manufacturer covered by patient's insurance. Replaces: Accu-Chek Guide test  strip   carvedilol  6.25 MG tablet Commonly known as: COREG  Take 1 tablet (6.25 mg total) by mouth 2 (two) times daily with a meal. What changed:  medication strength how much to take   Lancet Device Misc 1 each by Does not apply route in the morning, at noon, and at bedtime. May substitute to any manufacturer covered by patient's insurance.   Lancets Misc. Misc 1 each by Does not apply route in the morning, at noon, and at bedtime. May substitute to any manufacturer covered by patient's insurance.   Lantus  SoloStar 100 UNIT/ML Solostar Pen Generic drug: insulin  glargine Inject 5 Units into the skin at bedtime. Measure fasting blood sugar every day: goal for now is to get this to 80-130. Start insulin  at 5 units. Increase daily Insulin  dose by 2 units at a time, twice per week, until fasting sugars are consistently 80-130, then continue at that  dose Replaces: Lantus  100 UNIT/ML injection   metoCLOPramide  10 MG tablet Commonly known as: REGLAN  Take 1 tablet (10 mg total) by mouth 3 (three) times daily before meals.   Vitamin D -1000 Max St 25 MCG (1000 UT) tablet Generic drug: Cholecalciferol Take 1 tablet (1,000 Units total) by mouth daily. What changed: how much to take          No Known Allergies   Subjective: pt feeling better today, still some abd pain, no vomiting, not much po intake but he is keeping liquids down. He would really like to discharge today    Discharge Exam: BP (!) 144/93 (BP Location: Right Wrist)   Pulse 88   Temp 98.4 F (36.9 C) (Oral)   Resp 17   Ht 5' 9 (1.753 m)   Wt 73.9 kg   SpO2 98%   BMI 24.06 kg/m  General: Pt is alert, awake, not in acute distress Cardiovascular: RRR, S1/S2 +, no rubs, no gallops Respiratory: CTA bilaterally, no wheezing, no rhonchi Abdominal: Soft, mild RUQ tenderness but no guarding/rebound, no distension,  bowel sounds + Extremities: no edema, no cyanosis     The results of significant diagnostics from this hospitalization (including imaging, microbiology, ancillary and laboratory) are listed below for reference.     Microbiology: Recent Results (from the past 240 hours)  Resp panel by RT-PCR (RSV, Flu A&B, Covid) Anterior Nasal Swab     Status: None   Collection Time: 02/11/24  9:34 PM   Specimen: Anterior Nasal Swab  Result Value Ref Range Status   SARS Coronavirus 2 by RT PCR NEGATIVE NEGATIVE Final    Comment: (NOTE) SARS-CoV-2 target nucleic acids are NOT DETECTED.  The SARS-CoV-2 RNA is generally detectable in upper respiratory specimens during the acute phase of infection. The lowest concentration of SARS-CoV-2 viral copies this assay can detect is 138 copies/mL. A negative result does not preclude SARS-Cov-2 infection and should not be used as the sole basis for treatment or other patient management decisions. A negative result may  occur with  improper specimen collection/handling, submission of specimen other than nasopharyngeal swab, presence of viral mutation(s) within the areas targeted by this assay, and inadequate number of viral copies(<138 copies/mL). A negative result must be combined with clinical observations, patient history, and epidemiological information. The expected result is Negative.  Fact Sheet for Patients:  BloggerCourse.com  Fact Sheet for Healthcare Providers:  SeriousBroker.it  This test is no t yet approved or cleared by the [place]  FDA and  has been authorized for detection and/or diagnosis of SARS-CoV-2 by FDA  under an Emergency Use Authorization (EUA). This EUA will remain  in effect (meaning this test can be used) for the duration of the COVID-19 declaration under Section 564(b)(1) of the Act, 21 U.S.C.section 360bbb-3(b)(1), unless the authorization is terminated  or revoked sooner.       Influenza A by PCR NEGATIVE NEGATIVE Final   Influenza B by PCR NEGATIVE NEGATIVE Final    Comment: (NOTE) The Xpert Xpress SARS-CoV-2/FLU/RSV plus assay is intended as an aid in the diagnosis of influenza from Nasopharyngeal swab specimens and should not be used as a sole basis for treatment. Nasal washings and aspirates are unacceptable for Xpert Xpress SARS-CoV-2/FLU/RSV testing.  Fact Sheet for Patients: BloggerCourse.com  Fact Sheet for Healthcare Providers: SeriousBroker.it  This test is not yet approved or cleared by the [place]  FDA and has been authorized for detection and/or diagnosis of SARS-CoV-2 by FDA under an Emergency Use Authorization (EUA). This EUA will remain in effect (meaning this test can be used) for the duration of the COVID-19 declaration under Section 564(b)(1) of the Act, 21 U.S.C. section 360bbb-3(b)(1), unless the authorization is terminated  or revoked.     Resp Syncytial Virus by PCR NEGATIVE NEGATIVE Final    Comment: (NOTE) Fact Sheet for Patients: BloggerCourse.com  Fact Sheet for Healthcare Providers: SeriousBroker.it  This test is not yet approved or cleared by the [place]  FDA and has been authorized for detection and/or diagnosis of SARS-CoV-2 by FDA under an Emergency Use Authorization (EUA). This EUA will remain in effect (meaning this test can be used) for the duration of the COVID-19 declaration under Section 564(b)(1) of the Act, 21 U.S.C. section 360bbb-3(b)(1), unless the authorization is terminated or revoked.  Performed at [facility], [address] [place], [place] [address]   MRSA Next Gen by PCR, Nasal     Status: None   Collection Time: 02/12/24  4:09 AM   Specimen: Nasal Mucosa; Nasal Swab  Result Value Ref Range Status   MRSA by PCR Next Gen NOT DETECTED NOT DETECTED Final    Comment: (NOTE) The GeneXpert MRSA Assay (FDA approved for NASAL specimens only), is one component of a comprehensive MRSA colonization surveillance program. It is not intended to diagnose MRSA infection nor to guide or monitor treatment for MRSA infections. Test performance is not FDA approved in patients less than 88 years old. Performed at [facility], [address] [place], [place] [address]      Labs: BNP (last 3 results) No results for input(s): BNP in the last 8760 hours. Basic Metabolic Panel: Recent Labs  Lab 02/11/24 1419 02/12/24 0442  NA 142 143  K 3.7 3.7  CL 96* 97*  CO2 28 30  GLUCOSE 87 81  BUN 13 22*  CREATININE 5.26* 7.58*  CALCIUM  8.9 8.9   Liver Function Tests: Recent Labs  Lab 02/11/24 1419 02/12/24 0442  AST 11* 11*  ALT 11 9  ALKPHOS 79 74  BILITOT 2.0* 2.3*  PROT 7.8 7.4  ALBUMIN 4.1 3.9   Recent Labs  Lab 02/11/24 1419  LIPASE 27   No results for input(s): AMMONIA in the last 168  hours. CBC: Recent Labs  Lab 02/11/24 1419 02/12/24 0442  WBC 5.0 3.7*  HGB 10.0* 10.1*  HCT 31.3* 31.6*  MCV 92.1 91.6  PLT 223 199   Cardiac Enzymes: No results for input(s): CKTOTAL, CKMB, CKMBINDEX, TROPONINI in the last 168 hours. BNP: Invalid input(s): POCBNP CBG: Recent Labs  Lab 02/12/24 [phone]  02/12/24 0836 02/12/24 0852 02/12/24 1158 02/12/24 1631  GLUCAP 58* 66* 75 130* 118*   D-Dimer No results for input(s): DDIMER in the last 72 hours. Hgb A1c Recent Labs    02/12/24 0442  HGBA1C 7.8*   Lipid Profile No results for input(s): CHOL, HDL, LDLCALC, TRIG, CHOLHDL, LDLDIRECT in the last 72 hours. Thyroid  function studies No results for input(s): TSH, T4TOTAL, T3FREE, THYROIDAB in the last 72 hours.  Invalid input(s): FREET3 Anemia work up No results for input(s): VITAMINB12, FOLATE, FERRITIN, TIBC, IRON, RETICCTPCT in the last 72 hours. Urinalysis    Component Value Date/Time   COLORURINE AMBER (A) 06/01/2021 1023   APPEARANCEUR TURBID (A) 06/01/2021 1023   LABSPEC 1.017 06/01/2021 1023   PHURINE 6.0 06/01/2021 1023   GLUCOSEU 50 (A) 06/01/2021 1023   HGBUR MODERATE (A) 06/01/2021 1023   BILIRUBINUR NEGATIVE 06/01/2021 1023   KETONESUR NEGATIVE 06/01/2021 1023   PROTEINUR >=300 (A) 06/01/2021 1023   UROBILINOGEN 0.2 05/10/2007 2155   NITRITE NEGATIVE 06/01/2021 1023   LEUKOCYTESUR LARGE (A) 06/01/2021 1023   Sepsis Labs Recent Labs  Lab 02/11/24 1419 02/12/24 0442  WBC 5.0 3.7*   Microbiology Recent Results (from the past 240 hours)  Resp panel by RT-PCR (RSV, Flu A&B, Covid) Anterior Nasal Swab     Status: None   Collection Time: 02/11/24  9:34 PM   Specimen: Anterior Nasal Swab  Result Value Ref Range Status   SARS Coronavirus 2 by RT PCR NEGATIVE NEGATIVE Final    Comment: (NOTE) SARS-CoV-2 target nucleic acids are NOT DETECTED.  The SARS-CoV-2 RNA is generally detectable in upper  respiratory specimens during the acute phase of infection. The lowest concentration of SARS-CoV-2 viral copies this assay can detect is 138 copies/mL. A negative result does not preclude SARS-Cov-2 infection and should not be used as the sole basis for treatment or other patient management decisions. A negative result may occur with  improper specimen collection/handling, submission of specimen other than nasopharyngeal swab, presence of viral mutation(s) within the areas targeted by this assay, and inadequate number of viral copies(<138 copies/mL). A negative result must be combined with clinical observations, patient history, and epidemiological information. The expected result is Negative.  Fact Sheet for Patients:  BloggerCourse.com  Fact Sheet for Healthcare Providers:  SeriousBroker.it  This test is no t yet approved or cleared by the [place]  FDA and  has been authorized for detection and/or diagnosis of SARS-CoV-2 by FDA under an Emergency Use Authorization (EUA). This EUA will remain  in effect (meaning this test can be used) for the duration of the COVID-19 declaration under Section 564(b)(1) of the Act, 21 U.S.C.section 360bbb-3(b)(1), unless the authorization is terminated  or revoked sooner.       Influenza A by PCR NEGATIVE NEGATIVE Final   Influenza B by PCR NEGATIVE NEGATIVE Final    Comment: (NOTE) The Xpert Xpress SARS-CoV-2/FLU/RSV plus assay is intended as an aid in the diagnosis of influenza from Nasopharyngeal swab specimens and should not be used as a sole basis for treatment. Nasal washings and aspirates are unacceptable for Xpert Xpress SARS-CoV-2/FLU/RSV testing.  Fact Sheet for Patients: BloggerCourse.com  Fact Sheet for Healthcare Providers: SeriousBroker.it  This test is not yet approved or cleared by the [place]  FDA and has been  authorized for detection and/or diagnosis of SARS-CoV-2 by FDA under an Emergency Use Authorization (EUA). This EUA will remain in effect (meaning this test can be used) for the duration of the COVID-19 declaration  under Section 564(b)(1) of the Act, 21 U.S.C. section 360bbb-3(b)(1), unless the authorization is terminated or revoked.     Resp Syncytial Virus by PCR NEGATIVE NEGATIVE Final    Comment: (NOTE) Fact Sheet for Patients: BloggerCourse.com  Fact Sheet for Healthcare Providers: SeriousBroker.it  This test is not yet approved or cleared by the [place]  FDA and has been authorized for detection and/or diagnosis of SARS-CoV-2 by FDA under an Emergency Use Authorization (EUA). This EUA will remain in effect (meaning this test can be used) for the duration of the COVID-19 declaration under Section 564(b)(1) of the Act, 21 U.S.C. section 360bbb-3(b)(1), unless the authorization is terminated or revoked.  Performed at [facility], [address] [place], [place] [address]   MRSA Next Gen by PCR, Nasal     Status: None   Collection Time: 02/12/24  4:09 AM   Specimen: Nasal Mucosa; Nasal Swab  Result Value Ref Range Status   MRSA by PCR Next Gen NOT DETECTED NOT DETECTED Final    Comment: (NOTE) The GeneXpert MRSA Assay (FDA approved for NASAL specimens only), is one component of a comprehensive MRSA colonization surveillance program. It is not intended to diagnose MRSA infection nor to guide or monitor treatment for MRSA infections. Test performance is not FDA approved in patients less than 23 years old. Performed at [facility], [address] [place], [place] [address]    Imaging DG Abd Portable 2V Result Date: 02/12/2024 CLINICAL DATA:  [id number] Abdominal pain [phone]. EXAM: PORTABLE ABDOMEN - 2 VIEW COMPARISON:  06/09/2021. FINDINGS: The bowel gas pattern is non-obstructive. No evidence of  pneumoperitoneum. No acute osseous abnormalities. The soft tissues are within normal limits. Surgical changes, devices, tubes and lines: None. IMPRESSION: No acute abnormalities of the abdomen. Electronically Signed   By: Ree Molt M.D.   On: 02/12/2024 15:23   ECHOCARDIOGRAM COMPLETE Result Date: 02/12/2024    ECHOCARDIOGRAM REPORT   Patient Name:   EVELIO RUEDA Date of Exam: 02/11/2024 Medical Rec #:  [id number]       Height:       69.0 in Accession #:    [id number]      Weight:       162.9 lb Date of Birth:  11/28/1995       BSA:          1.894 m Patient Age:    28 years        BP:           152/88 mmHg Patient Gender: M               HR:           91 bpm. Exam Location:  [facility] Procedure: 2D Echo, 3D Echo, Cardiac Doppler, Color Doppler and Strain Analysis            (Both Spectral and Color Flow Doppler were utilized during            procedure). Indications:     Heart Failure with reduced Ejection Fraction  History:         Patient has prior history of Echocardiogram examinations, most                  recent 06/05/2021. Arrythmias:Paroxysmal Atrial Fibrillation;                  Risk Factors:Hypertension, Marijuanna Abuse. and Diabetes.  Cardiac Arrest. Cardiomyopathy. Congestive heart failure. End                  Stage Renal Disease. COVID-19.  Sonographer:     Carl Coma RDCS Referring Phys:  [id number] RYAN M DUNN Diagnosing Phys: Lonni Hanson MD IMPRESSIONS  1. Left ventricular ejection fraction, by estimation, is 50 to 55%. The left ventricle has low normal function. The left ventricle has no regional wall motion abnormalities. There is severe left ventricular hypertrophy. Indeterminate diastolic filling due to E-A fusion. The average left ventricular global longitudinal strain is -16.4 %. The global longitudinal strain is normal.  2. Right ventricular systolic function is normal. The right ventricular size is normal.  3. The mitral valve is normal in structure. Trivial  mitral valve regurgitation. No evidence of mitral stenosis.  4. The aortic valve is tricuspid. Aortic valve regurgitation is mild. No aortic stenosis is present.  5. The inferior vena cava is normal in size with greater than 50% respiratory variability, suggesting right atrial pressure of 3 mmHg. FINDINGS  Left Ventricle: Left ventricular ejection fraction, by estimation, is 50 to 55%. The left ventricle has low normal function. The left ventricle has no regional wall motion abnormalities. The average left ventricular global longitudinal strain is -16.4 %. Strain was performed and the global longitudinal strain is normal. 3D ejection fraction reviewed and evaluated as part of the interpretation. Alternate measurement of EF is felt to be most reflective of LV function. The left ventricular internal cavity size was normal in size. There is severe left ventricular hypertrophy. Indeterminate diastolic filling due to E-A fusion. Right Ventricle: The right ventricular size is normal. No increase in right ventricular wall thickness. Right ventricular systolic function is normal. Left Atrium: Left atrial size was normal in size. Right Atrium: Right atrial size was normal in size. Pericardium: There is no evidence of pericardial effusion. Mitral Valve: The mitral valve is normal in structure. Trivial mitral valve regurgitation. No evidence of mitral valve stenosis. Tricuspid Valve: The tricuspid valve is normal in structure. Tricuspid valve regurgitation is trivial. The aortic valve is tricuspid. Aortic valve regurgitation is mild. No aortic stenosis is present. Pulmonic Valve: The pulmonic valve was grossly normal. Pulmonic valve regurgitation is not visualized. No evidence of pulmonic stenosis. Aorta: The aortic root and ascending aorta are structurally normal, with no evidence of dilitation. Pulmonary Artery: The pulmonary artery is not well seen. Venous: The inferior vena cava is normal in size with greater than 50%  respiratory variability, suggesting right atrial pressure of 3 mmHg. IAS/Shunts: The interatrial septum was not well visualized.  LEFT VENTRICLE PLAX 2D LVIDd:         4.30 cm   Diastology LVIDs:         3.00 cm   LV e' medial:    5.77 cm/s LV PW:         1.72 cm   LV E/e' medial:  10.7 LV IVS:        1.56 cm   LV e' lateral:   7.65 cm/s LVOT diam:     2.20 cm   LV E/e' lateral: 8.1 LV SV:         58 LV SV Index:   31        2D Longitudinal Strain LVOT Area:     3.80 cm  2D Strain GLS (A4C):   -16.1 %  2D Strain GLS (A3C):   -16.5 %                          2D Strain GLS (A2C):   -16.7 %                          2D Strain GLS Avg:     -16.4 %                           3D Volume EF:                          3D EF:        35 %                          LV EDV:       206 ml                          LV ESV:       135 ml                          LV SV:        71 ml RIGHT VENTRICLE             IVC RV Basal diam:  3.30 cm     IVC diam: 1.50 cm RV S prime:     10.01 cm/s TAPSE (M-mode): 2.0 cm LEFT ATRIUM             Index        RIGHT ATRIUM           Index LA diam:        3.70 cm 1.95 cm/m   RA Area:     11.80 cm LA Vol (A2C):   58.7 ml 31.00 ml/m  RA Volume:   26.50 ml  13.99 ml/m LA Vol (A4C):   32.2 ml 17.00 ml/m LA Biplane Vol: 45.0 ml 23.76 ml/m  AORTIC VALVE LVOT Vmax:   99.70 cm/s LVOT Vmean:  64.167 cm/s LVOT VTI:    0.152 m AI PHT:      232 msec  AORTA Ao Root diam: 3.30 cm Ao Asc diam:  2.90 cm MITRAL VALVE MV Area (PHT): 4.89 cm    SHUNTS MV Decel Time: 155 msec    Systemic VTI:  0.15 m MV E velocity: 61.80 cm/s  Systemic Diam: 2.20 cm MV A velocity: 79.70 cm/s MV E/A ratio:  0.78 Lonni End MD Electronically signed by Lonni Hanson MD Signature Date/Time: 02/12/2024/7:43:21 AM    Final    DG Chest Port 1 View Result Date: 02/11/2024 CLINICAL DATA:  Nausea and vomiting. EXAM: PORTABLE CHEST 1 VIEW COMPARISON:  August 12, 2022 FINDINGS: The heart size and mediastinal  contours are within normal limits. Both lungs are clear. Chronic seventh and eighth right rib fractures are seen. The visualized skeletal structures are unremarkable. IMPRESSION: No active cardiopulmonary disease. Electronically Signed   By: Suzen Dials M.D.   On: 02/11/2024 21:26   US  ABDOMEN LIMITED RUQ (LIVER/GB) Result Date: 02/11/2024 CLINICAL DATA:  Transaminitis, acute abdominal pain. EXAM: ULTRASOUND ABDOMEN LIMITED RIGHT UPPER QUADRANT COMPARISON:  August 23, 2022. FINDINGS: Gallbladder: No gallstones or wall thickening visualized.  No sonographic Murphy sign noted by sonographer. Common bile duct: Diameter: 5 mm which is within normal limits. Liver: No focal lesion identified. Within normal limits in parenchymal echogenicity. Portal vein is patent on color Doppler imaging with normal direction of blood flow towards the liver. Other: None. IMPRESSION: No definite abnormality seen in the right upper quadrant of the abdomen. Electronically Signed   By: Lynwood Landy Raddle M.D.   On: 02/11/2024 16:28      Time coordinating discharge: over 30 minutes  SIGNED:  Shyla Gayheart DO [facility]

## 2024-02-12 NOTE — Progress Notes (Signed)
 Received call from patient's Aunt Madelin Sor, Patient and patient's aunt both endorsed that she is [name], not his legal guardian.  Update provided to patient's aunt and patient's aunt aware of potential discharge today and plan of care.

## 2024-02-12 NOTE — Progress Notes (Incomplete)
 PROGRESS NOTE    Jonathan Mcmillan   FMW:[id number] DOB: 1995-06-22  DOA: 02/11/2024 Date of Service: 02/12/24 which is hospital day 0  PCP: Jonathan Domino, MD    Hospital course / significant events:   HPI: [name] is a 28 year old male with history of heart failure reduced ejection fraction, paroxysmal atrial fibrillation, non-insulin -dependent diabetes mellitus type 1, history of PEA arrest, end-stage renal disease on hemodialysis, who presents ED for chief concern of generalized abdominal pain for 4 days.  09/30: to ED.  Cardiology asked to evaluate urgently for abnormal EKG concerning for LVH with early repull versus anteroseptal STEMI.  Bedside echo performed by interventional cardiology showed no regional wall motion is along the anteroseptal wall with stable to improved LV systolic function, particularly along the anteroseptal region.  Code STEMI was not activated given reassuring echo and lack of angina. Recommend obtaining formal echo and cycling cardiac enzymes  10/01: echo read as LVEF 50-55, low normal LV fxn, severe LVH     Consultants:  Cardiology   Procedures/Surgeries: ***      ASSESSMENT & PLAN:   Acute epigastric pain Differentials include gastroenteritis, gastroparesis, cardiac related pain, noncardiac related pain (musculoskeletal), as needed nitroglycerin  ointment for chest pain, epigastric pain ordered.  High suspicion for MSK as the pain is reproducible with palpation Cardiology has been consulted in setting of history of PEA arrest - no acute cardiac concerns  Complete echo has been ordered pending read Strict I's and O's Check respiratory panel for COVID/influenza A/influenza B/RSV PCR --> neg Check portable chest x-ray --> Chronic seventh and eighth right rib fractures are seen, no other findings Symptomatic support: 25 mcg IV every 4 hours as needed for moderate and severe pain, 20 hours of coverage ordered. Lidocaine  patch, 1-2 patch to the  right side of his chest and right upper quadrant abdominal area, 3 days ordered   Type 1 diabetes mellitus with complication  Insulin  SSI with at bedtime coverage, end-stage renal disease dosing ordered on admission Goal inpatient blood glucose levels 140-180   Anemia in ESRD (end-stage renal disease)  Patient's hemoglobin range has been 10.7-12.2 Follow CBC   ESRD on hemodialysis  Nephrology has been consulted via epic order   Serum total bilirubin elevated Right upper quadrant ultrasound was read as no definite abnormality seen in right upper quadrant   Polysubstance abuse  UDS considered, however, he is anuric.       Abnormal EKG: - Never with chest pain or dyspnea -Presented with abdominal pain, nausea, and vomiting with decreased appetite - Bedside echo performed by interventional cardiology showed stable to possibly slightly improved EF without significant anterior WMA - No evidence of STEMI at this time - Recommend obtaining formal echo and cycling cardiac enzymes   2.  HFrEF with history of PEA arrest: - PEA arrest suspected be in the setting of metabolic derangement - No prior ischemic evaluation available for review, this should be considered moving forward pending clinical course, echo findings, and cardiac enzymes - Continue PTA GDMT as able - Volume management per hemodialysis   2. Abdominal pain with nausea and vomiting: - Management per ER and internal medicine   3.  ESRD on HD with anemia of chronic disease: - Management per nephrology     *** based on BMI: Body mass index is 24.06 kg/m.SABRA Significantly low or high BMI is associated with higher medical risk.  Underweight - under 18  overweight - 25 to 29 obese - 30 or  more Class 1 obesity: BMI of 30.0 to 34 Class 2 obesity: BMI of 35.0 to 39 Class 3 obesity: BMI of 40.0 to 49 Super Morbid Obesity: BMI 50-59 Super-super Morbid Obesity: BMI 60+ Healthy nutrition and physical activity advised as  adjunct to other disease management and risk reduction treatments    DVT prophylaxis: *** IV fluids: *** continuous IV fluids  Nutrition: *** Central lines / other devices: ***  Code Status: *** ACP documentation reviewed: *** none on file in [facility]  TOC needs: *** Medical barriers to dispo: ***. Expected medical readiness for discharge ***.              Subjective / Brief ROS:  Patient reports *** Denies CP/SOB.  Pain controlled.  Denies new weakness.  Tolerating diet ***.  Reports no concerns w/ urination/defecation.   Family Communication: ***    Objective Findings:  Vitals:   02/11/24 2000 02/11/24 2100 02/11/24 2242 02/12/24 0300  BP: (!) 144/91 (!) 143/86 (!) 134/98 (!) 158/100  Pulse: 91 88 94 91  Resp: 17 15 18 18   Temp:  98.1 F (36.7 C) 98.2 F (36.8 C) 98 F (36.7 C)  TempSrc:  Oral    SpO2: 100% 98% 99% 98%  Weight:      Height:        Intake/Output Summary (Last 24 hours) at 02/12/2024 0805 Last data filed at 02/12/2024 0316 Gross per 24 hour  Intake 240 ml  Output --  Net 240 ml   Filed Weights   02/11/24 1418 02/11/24 1420 02/11/24 1444  Weight: 73.9 kg 73.9 kg 73.9 kg    Examination:  Physical Exam Constitutional:      General: He is not in acute distress. Cardiovascular:     Rate and Rhythm: Normal rate and regular rhythm.  Pulmonary:     Effort: Pulmonary effort is normal.     Breath sounds: Normal breath sounds.  Abdominal:     General: Bowel sounds are normal.     Palpations: Abdomen is soft.     Tenderness: There is abdominal tenderness in the right upper quadrant.  Skin:    General: Skin is warm and dry.  Neurological:     General: No focal deficit present.     Mental Status: He is alert and oriented to person, place, and time.          Scheduled Medications:   Chlorhexidine  Gluconate Cloth  6 each Topical Q0600   heparin   5,000 Units Subcutaneous Q8H   insulin  aspart  0-5 Units Subcutaneous QHS    insulin  aspart  0-6 Units Subcutaneous TID WC   lidocaine   1-2 patch Transdermal Q24H    Continuous Infusions:   PRN Medications:  acetaminophen  **OR** acetaminophen , fentaNYL  (SUBLIMAZE ) injection, nitroGLYCERIN , ondansetron  **OR** ondansetron  (ZOFRAN ) IV  Antimicrobials from admission:  Anti-infectives (From admission, onward)    None           Data Reviewed:  I have personally reviewed the following...  CBC: Recent Labs  Lab 02/11/24 1419 02/12/24 0442  WBC 5.0 3.7*  HGB 10.0* 10.1*  HCT 31.3* 31.6*  MCV 92.1 91.6  PLT 223 199   Basic Metabolic Panel: Recent Labs  Lab 02/11/24 1419 02/12/24 0442  NA 142 143  K 3.7 3.7  CL 96* 97*  CO2 28 30  GLUCOSE 87 81  BUN 13 22*  CREATININE 5.26* 7.58*  CALCIUM  8.9 8.9   GFR: Estimated Creatinine Clearance: 14.2 mL/min (A) (by C-G formula based on  SCr of 7.58 mg/dL (H)). Liver Function Tests: Recent Labs  Lab 02/11/24 1419  AST 11*  ALT 11  ALKPHOS 79  BILITOT 2.0*  PROT 7.8  ALBUMIN 4.1   Recent Labs  Lab 02/11/24 1419  LIPASE 27   No results for input(s): AMMONIA in the last 168 hours. Coagulation Profile: No results for input(s): INR, PROTIME in the last 168 hours. Cardiac Enzymes: No results for input(s): CKTOTAL, CKMB, CKMBINDEX, TROPONINI in the last 168 hours. BNP (last 3 results) No results for input(s): PROBNP in the last 8760 hours. HbA1C: No results for input(s): HGBA1C in the last 72 hours. CBG: Recent Labs  Lab 02/11/24 2215 02/11/24 2246  GLUCAP 102* 82   Lipid Profile: No results for input(s): CHOL, HDL, LDLCALC, TRIG, CHOLHDL, LDLDIRECT in the last 72 hours. Thyroid  Function Tests: No results for input(s): TSH, T4TOTAL, FREET4, T3FREE, THYROIDAB in the last 72 hours. Anemia Panel: No results for input(s): VITAMINB12, FOLATE, FERRITIN, TIBC, IRON, RETICCTPCT in the last 72 hours. Most Recent Urinalysis On File:      Component Value Date/Time   COLORURINE AMBER (A) 06/01/2021 1023   APPEARANCEUR TURBID (A) 06/01/2021 1023   LABSPEC 1.017 06/01/2021 1023   PHURINE 6.0 06/01/2021 1023   GLUCOSEU 50 (A) 06/01/2021 1023   HGBUR MODERATE (A) 06/01/2021 1023   BILIRUBINUR NEGATIVE 06/01/2021 1023   KETONESUR NEGATIVE 06/01/2021 1023   PROTEINUR >=300 (A) 06/01/2021 1023   UROBILINOGEN 0.2 05/10/2007 2155   NITRITE NEGATIVE 06/01/2021 1023   LEUKOCYTESUR LARGE (A) 06/01/2021 1023   Sepsis Labs: @LABRCNTIP (procalcitonin:4,lacticidven:4) Microbiology: Recent Results (from the past 240 hours)  Resp panel by RT-PCR (RSV, Flu A&B, Covid) Anterior Nasal Swab     Status: None   Collection Time: 02/11/24  9:34 PM   Specimen: Anterior Nasal Swab  Result Value Ref Range Status   SARS Coronavirus 2 by RT PCR NEGATIVE NEGATIVE Final    Comment: (NOTE) SARS-CoV-2 target nucleic acids are NOT DETECTED.  The SARS-CoV-2 RNA is generally detectable in upper respiratory specimens during the acute phase of infection. The lowest concentration of SARS-CoV-2 viral copies this assay can detect is 138 copies/mL. A negative result does not preclude SARS-Cov-2 infection and should not be used as the sole basis for treatment or other patient management decisions. A negative result may occur with  improper specimen collection/handling, submission of specimen other than nasopharyngeal swab, presence of viral mutation(s) within the areas targeted by this assay, and inadequate number of viral copies(<138 copies/mL). A negative result must be combined with clinical observations, patient history, and epidemiological information. The expected result is Negative.  Fact Sheet for Patients:  BloggerCourse.com  Fact Sheet for Healthcare Providers:  SeriousBroker.it  This test is no t yet approved or cleared by the [place]  FDA and  has been authorized for detection and/or  diagnosis of SARS-CoV-2 by FDA under an Emergency Use Authorization (EUA). This EUA will remain  in effect (meaning this test can be used) for the duration of the COVID-19 declaration under Section 564(b)(1) of the Act, 21 U.S.C.section 360bbb-3(b)(1), unless the authorization is terminated  or revoked sooner.       Influenza A by PCR NEGATIVE NEGATIVE Final   Influenza B by PCR NEGATIVE NEGATIVE Final    Comment: (NOTE) The Xpert Xpress SARS-CoV-2/FLU/RSV plus assay is intended as an aid in the diagnosis of influenza from Nasopharyngeal swab specimens and should not be used as a sole basis for treatment. Nasal washings and aspirates are unacceptable  for Xpert Xpress SARS-CoV-2/FLU/RSV testing.  Fact Sheet for Patients: BloggerCourse.com  Fact Sheet for Healthcare Providers: SeriousBroker.it  This test is not yet approved or cleared by the [place]  FDA and has been authorized for detection and/or diagnosis of SARS-CoV-2 by FDA under an Emergency Use Authorization (EUA). This EUA will remain in effect (meaning this test can be used) for the duration of the COVID-19 declaration under Section 564(b)(1) of the Act, 21 U.S.C. section 360bbb-3(b)(1), unless the authorization is terminated or revoked.     Resp Syncytial Virus by PCR NEGATIVE NEGATIVE Final    Comment: (NOTE) Fact Sheet for Patients: BloggerCourse.com  Fact Sheet for Healthcare Providers: SeriousBroker.it  This test is not yet approved or cleared by the [place]  FDA and has been authorized for detection and/or diagnosis of SARS-CoV-2 by FDA under an Emergency Use Authorization (EUA). This EUA will remain in effect (meaning this test can be used) for the duration of the COVID-19 declaration under Section 564(b)(1) of the Act, 21 U.S.C. section 360bbb-3(b)(1), unless the authorization is terminated  or revoked.  Performed at [facility], [address] [place], [place] [address]   MRSA Next Gen by PCR, Nasal     Status: None   Collection Time: 02/12/24  4:09 AM   Specimen: Nasal Mucosa; Nasal Swab  Result Value Ref Range Status   MRSA by PCR Next Gen NOT DETECTED NOT DETECTED Final    Comment: (NOTE) The GeneXpert MRSA Assay (FDA approved for NASAL specimens only), is one component of a comprehensive MRSA colonization surveillance program. It is not intended to diagnose MRSA infection nor to guide or monitor treatment for MRSA infections. Test performance is not FDA approved in patients less than 14 years old. Performed at [facility], [address] [place], [place] [address]       Radiology Studies last 3 days: ECHOCARDIOGRAM COMPLETE Result Date: 02/12/2024    ECHOCARDIOGRAM REPORT   Patient Name:   Jonathan Mcmillan Date of Exam: 02/11/2024 Medical Rec #:  [id number]       Height:       69.0 in Accession #:    [id number]      Weight:       162.9 lb Date of Birth:  1995-10-12       BSA:          1.894 m Patient Age:    28 years        BP:           152/88 mmHg Patient Gender: M               HR:           91 bpm. Exam Location:  [facility] Procedure: 2D Echo, 3D Echo, Cardiac Doppler, Color Doppler and Strain Analysis            (Both Spectral and Color Flow Doppler were utilized during            procedure). Indications:     Heart Failure with reduced Ejection Fraction  History:         Patient has prior history of Echocardiogram examinations, most                  recent 06/05/2021. Arrythmias:Paroxysmal Atrial Fibrillation;                  Risk Factors:Hypertension, Marijuanna Abuse. and Diabetes.  Cardiac Arrest. Cardiomyopathy. Congestive heart failure. End                  Stage Renal Disease. COVID-19.  Sonographer:     Carl Coma RDCS Referring Phys:  [id number] RYAN M DUNN Diagnosing Phys: Lonni Hanson MD IMPRESSIONS  1. Left  ventricular ejection fraction, by estimation, is 50 to 55%. The left ventricle has low normal function. The left ventricle has no regional wall motion abnormalities. There is severe left ventricular hypertrophy. Indeterminate diastolic filling due to E-A fusion. The average left ventricular global longitudinal strain is -16.4 %. The global longitudinal strain is normal.  2. Right ventricular systolic function is normal. The right ventricular size is normal.  3. The mitral valve is normal in structure. Trivial mitral valve regurgitation. No evidence of mitral stenosis.  4. The aortic valve is tricuspid. Aortic valve regurgitation is mild. No aortic stenosis is present.  5. The inferior vena cava is normal in size with greater than 50% respiratory variability, suggesting right atrial pressure of 3 mmHg. FINDINGS  Left Ventricle: Left ventricular ejection fraction, by estimation, is 50 to 55%. The left ventricle has low normal function. The left ventricle has no regional wall motion abnormalities. The average left ventricular global longitudinal strain is -16.4 %. Strain was performed and the global longitudinal strain is normal. 3D ejection fraction reviewed and evaluated as part of the interpretation. Alternate measurement of EF is felt to be most reflective of LV function. The left ventricular internal cavity size was normal in size. There is severe left ventricular hypertrophy. Indeterminate diastolic filling due to E-A fusion. Right Ventricle: The right ventricular size is normal. No increase in right ventricular wall thickness. Right ventricular systolic function is normal. Left Atrium: Left atrial size was normal in size. Right Atrium: Right atrial size was normal in size. Pericardium: There is no evidence of pericardial effusion. Mitral Valve: The mitral valve is normal in structure. Trivial mitral valve regurgitation. No evidence of mitral valve stenosis. Tricuspid Valve: The tricuspid valve is normal in  structure. Tricuspid valve regurgitation is trivial. The aortic valve is tricuspid. Aortic valve regurgitation is mild. No aortic stenosis is present. Pulmonic Valve: The pulmonic valve was grossly normal. Pulmonic valve regurgitation is not visualized. No evidence of pulmonic stenosis. Aorta: The aortic root and ascending aorta are structurally normal, with no evidence of dilitation. Pulmonary Artery: The pulmonary artery is not well seen. Venous: The inferior vena cava is normal in size with greater than 50% respiratory variability, suggesting right atrial pressure of 3 mmHg. IAS/Shunts: The interatrial septum was not well visualized.  LEFT VENTRICLE PLAX 2D LVIDd:         4.30 cm   Diastology LVIDs:         3.00 cm   LV e' medial:    5.77 cm/s LV PW:         1.72 cm   LV E/e' medial:  10.7 LV IVS:        1.56 cm   LV e' lateral:   7.65 cm/s LVOT diam:     2.20 cm   LV E/e' lateral: 8.1 LV SV:         58 LV SV Index:   31        2D Longitudinal Strain LVOT Area:     3.80 cm  2D Strain GLS (A4C):   -16.1 %  2D Strain GLS (A3C):   -16.5 %                          2D Strain GLS (A2C):   -16.7 %                          2D Strain GLS Avg:     -16.4 %                           3D Volume EF:                          3D EF:        35 %                          LV EDV:       206 ml                          LV ESV:       135 ml                          LV SV:        71 ml RIGHT VENTRICLE             IVC RV Basal diam:  3.30 cm     IVC diam: 1.50 cm RV S prime:     10.01 cm/s TAPSE (M-mode): 2.0 cm LEFT ATRIUM             Index        RIGHT ATRIUM           Index LA diam:        3.70 cm 1.95 cm/m   RA Area:     11.80 cm LA Vol (A2C):   58.7 ml 31.00 ml/m  RA Volume:   26.50 ml  13.99 ml/m LA Vol (A4C):   32.2 ml 17.00 ml/m LA Biplane Vol: 45.0 ml 23.76 ml/m  AORTIC VALVE LVOT Vmax:   99.70 cm/s LVOT Vmean:  64.167 cm/s LVOT VTI:    0.152 m AI PHT:      232 msec  AORTA Ao Root diam: 3.30 cm Ao  Asc diam:  2.90 cm MITRAL VALVE MV Area (PHT): 4.89 cm    SHUNTS MV Decel Time: 155 msec    Systemic VTI:  0.15 m MV E velocity: 61.80 cm/s  Systemic Diam: 2.20 cm MV A velocity: 79.70 cm/s MV E/A ratio:  0.78 Lonni End MD Electronically signed by Lonni Hanson MD Signature Date/Time: 02/12/2024/7:43:21 AM    Final    DG Chest Port 1 View Result Date: 02/11/2024 CLINICAL DATA:  Nausea and vomiting. EXAM: PORTABLE CHEST 1 VIEW COMPARISON:  August 12, 2022 FINDINGS: The heart size and mediastinal contours are within normal limits. Both lungs are clear. Chronic seventh and eighth right rib fractures are seen. The visualized skeletal structures are unremarkable. IMPRESSION: No active cardiopulmonary disease. Electronically Signed   By: Suzen Dials M.D.   On: 02/11/2024 21:26   US  ABDOMEN LIMITED RUQ (LIVER/GB) Result Date: 02/11/2024 CLINICAL DATA:  Transaminitis, acute abdominal pain. EXAM: ULTRASOUND ABDOMEN LIMITED RIGHT UPPER QUADRANT COMPARISON:  August 23, 2022. FINDINGS: Gallbladder: No gallstones or wall thickening visualized.  No sonographic Murphy sign noted by sonographer. Common bile duct: Diameter: 5 mm which is within normal limits. Liver: No focal lesion identified. Within normal limits in parenchymal echogenicity. Portal vein is patent on color Doppler imaging with normal direction of blood flow towards the liver. Other: None. IMPRESSION: No definite abnormality seen in the right upper quadrant of the abdomen. Electronically Signed   By: Lynwood Landy Raddle M.D.   On: 02/11/2024 16:28         Rian Koon, DO [facility] 02/12/2024, 8:05 AM    Dictation software may have been used to generate the above note. Typos may occur and escape review in typed/dictated notes. Please contact Dr Marsa directly for clarity if needed.  Staff may message me via secure chat in Epic  but this may not receive an immediate response,  please page me for urgent matters!  If  7PM-7AM, please contact night coverage [email or web address]

## 2024-02-19 ENCOUNTER — Telehealth (INDEPENDENT_AMBULATORY_CARE_PROVIDER_SITE_OTHER): Payer: Self-pay

## 2024-02-19 NOTE — Telephone Encounter (Signed)
I attempted to contact the patient to schedule him for a permcath removal. A message was left for a return call.

## 2024-02-24 ENCOUNTER — Telehealth (INDEPENDENT_AMBULATORY_CARE_PROVIDER_SITE_OTHER): Payer: Self-pay

## 2024-02-24 NOTE — Telephone Encounter (Signed)
 I attempted to contact the patient and his aunt to schedule him or a permcath removal. A message was left for  return call.

## 2024-02-26 ENCOUNTER — Ambulatory Visit

## 2024-02-26 VITALS — BP 136/80 | HR 81 | Ht 69.0 in | Wt 155.0 lb

## 2024-02-26 DIAGNOSIS — Z8674 Personal history of sudden cardiac arrest: Secondary | ICD-10-CM

## 2024-02-26 DIAGNOSIS — I5020 Unspecified systolic (congestive) heart failure: Secondary | ICD-10-CM | POA: Diagnosis not present

## 2024-02-26 DIAGNOSIS — Z992 Dependence on renal dialysis: Secondary | ICD-10-CM

## 2024-02-26 DIAGNOSIS — N186 End stage renal disease: Secondary | ICD-10-CM

## 2024-02-26 DIAGNOSIS — I480 Paroxysmal atrial fibrillation: Secondary | ICD-10-CM | POA: Diagnosis not present

## 2024-02-26 NOTE — Patient Instructions (Signed)
 Medication Instructions:  Your physician recommends that you continue on your current medications as directed. Please refer to the Current Medication list given to you today.  *If you need a refill on your cardiac medications before your next appointment, please call your pharmacy*  Lab Work: No labs ordered today  If you have labs (blood work) drawn today and your tests are completely normal, you will receive your results only by: MyChart Message (if you have MyChart) OR A paper copy in the mail If you have any lab test that is abnormal or we need to change your treatment, we will call you to review the results.  Testing/Procedures: No test ordered today   Follow-Up: At [facility], you and your health needs are our priority.  As part of our continuing mission to provide you with exceptional heart care, our providers are all part of one team.  This team includes your primary Cardiologist (physician) and Advanced Practice Providers or APPs (Physician Assistants and Nurse Practitioners) who all work together to provide you with the care you need, when you need it.  Your next appointment:   12 month(s) (or sooner if any symptoms arise)  Provider:   Caron Poser, MD   We recommend signing up for the patient portal called MyChart.  Sign up information is provided on this After Visit Summary.  MyChart is used to connect with patients for Virtual Visits (Telemedicine).  Patients are able to view lab/test results, encounter notes, upcoming appointments, etc.  Non-urgent messages can be sent to your provider as well.   To learn more about what you can do with MyChart, go to [email or web address].

## 2024-02-26 NOTE — Progress Notes (Signed)
 Cardiology Office Note   Date:  02/26/2024  ID:  ZYGMUNT MCGLINN, DOB 1995/09/08, MRN [id number] PCP: Tobie Domino, MD  [facility] Providers Cardiologist:  Caron Poser, MD Electrophysiologist:  OLE ONEIDA HOLTS, MD     History of Present Illness Jonathan Mcmillan is a 28 y.o. male PMH ESRD on IHD, history of PEA arrest, paroxysmal atrial fibrillation, DM 1, heart failure with recovered ejection fraction who presents for hospital follow-up.  Patient was recently discharged from [facility] after a 4-day history of abdominal pain.  Echo was obtained which showed low normal LV function and severe LVH.  Troponin was only mildly elevated and flat.  Overall, it was felt that his epigastric pain was due to gastroparesis.  He has a history of failure with moderately reduced ejection fraction.  This was immediately after a PEA arrest back in 2023.  No LDL on file.  On interview today, he says he is feeling well.  He denies any dyspnea on exertion.  He denies any chest discomfort.  He denies any syncope.  He denies any other significant symptoms.  Relevant CVD History -TTE 02/12/2024 LVEF 50 to 55% with moderate LVH, normal RV function, mild AI - TTE 05/2021 LVEF 35 to 40%, mild reduced RV function, mild AI - CT chest without contrast 05/2021 some aortic arch calcifications, but no significant CAC   ROS: Pt denies any chest discomfort, jaw pain, arm pain, palpitations, syncope, presyncope, orthopnea, PND, or LE edema.  Studies Reviewed I have independently reviewed the patient's ECG, previous cardiac testing, previous medical records.  Physical Exam VS:  BP 136/80 (BP Location: Right Arm, Patient Position: Sitting, Cuff Size: Normal)   Pulse 81   Ht 5' 9 (1.753 m)   Wt 155 lb (70.3 kg)   SpO2 99%   BMI 22.89 kg/m        Wt Readings from Last 3 Encounters:  02/26/24 155 lb (70.3 kg)  02/11/24 162 lb 14.7 oz (73.9 kg)  08/23/22 135 lb (61.2 kg)    GEN: No acute distress. NECK: No  JVD; No carotid bruits. CARDIAC: RRR, no murmurs, rubs, gallops. RESPIRATORY:  Clear to auscultation. EXTREMITIES:  Warm and well-perfused. No edema.  ASSESSMENT AND PLAN Paroxysmal atrial fibrillation CHA2DS2-VASc score of at least 3.  Has not been on anticoagulation in the past.  Several reports of medication nonadherence.  Discussed Eliquis for CVA PPx.  He is not interested at this time.  I did review all of the twelve-lead ECGs available in the chart; there are no tracings showing atrial fibrillation.  Plan: - Continue Coreg  6.25 mg twice daily - Continue to discuss Eliquis at subsequent appointment; we can also consider obtaining a 2-week monitor to assess burden of atrial fibrillation (or possible lack thereof)   Heart failure with recovered ejection fraction ESRD Ejection fraction 35 to 40% in 2023 which has recovered to 50 to 55% as of 01/2024.  Unclear etiology, though this was immediately after a PEA arrest for metabolic derangements and possible aspiration, so likely stunning secondary to cardiac arrest.  ESRD limits GDMT use.  He is currently without any signs or symptoms of angina.  Plan: - Continue Coreg  6.25 mg twice daily - ESRD limits ACE/ARB/ARNI, MRA, and SGLT2.  His ejection fraction has fortunately recovered without these agents anyways.  As above, suspect this was due to stunning in the setting of PEA arrest - If he develops recurrent cardiomyopathy, chest discomfort, exertional dyspnea or other signs of possible CAD,  then we should obtain a stress PET to further stratify        Dispo: RTC 1 year or sooner as needed  Signed, Caron Poser, MD

## 2024-03-03 ENCOUNTER — Telehealth (INDEPENDENT_AMBULATORY_CARE_PROVIDER_SITE_OTHER): Payer: Self-pay

## 2024-03-03 NOTE — Telephone Encounter (Signed)
 I attempted to contact the patient to schedule him for a permcath removal at the [facility]. A message was left for a return call with his Aunt.

## 2024-03-05 NOTE — Telephone Encounter (Signed)
 Spoke with the patient's mother and he is scheduled with Dr. Marea for a permcath removal on 03/09/24 with a 2:15 pm arrival time to the [facility]. Pre-procedure instructions were discussed and patient's mother stated she wrote them down.

## 2024-03-07 ENCOUNTER — Emergency Department
Admission: EM | Admit: 2024-03-07 | Discharge: 2024-03-07 | Disposition: A | Discharge status: Home / Self Care | Attending: Emergency Medicine | Admitting: Emergency Medicine

## 2024-03-07 ENCOUNTER — Emergency Department

## 2024-03-07 DIAGNOSIS — E1022 Type 1 diabetes mellitus with diabetic chronic kidney disease: Secondary | ICD-10-CM | POA: Insufficient documentation

## 2024-03-07 DIAGNOSIS — E109 Type 1 diabetes mellitus without complications: Secondary | ICD-10-CM

## 2024-03-07 DIAGNOSIS — Z992 Dependence on renal dialysis: Secondary | ICD-10-CM | POA: Diagnosis not present

## 2024-03-07 DIAGNOSIS — N186 End stage renal disease: Secondary | ICD-10-CM | POA: Insufficient documentation

## 2024-03-07 DIAGNOSIS — R112 Nausea with vomiting, unspecified: Secondary | ICD-10-CM | POA: Diagnosis not present

## 2024-03-07 DIAGNOSIS — I5020 Unspecified systolic (congestive) heart failure: Secondary | ICD-10-CM | POA: Insufficient documentation

## 2024-03-07 DIAGNOSIS — I10 Essential (primary) hypertension: Secondary | ICD-10-CM

## 2024-03-07 DIAGNOSIS — R1013 Epigastric pain: Secondary | ICD-10-CM | POA: Diagnosis present

## 2024-03-07 DIAGNOSIS — Z8616 Personal history of COVID-19: Secondary | ICD-10-CM | POA: Insufficient documentation

## 2024-03-07 DIAGNOSIS — I132 Hypertensive heart and chronic kidney disease with heart failure and with stage 5 chronic kidney disease, or end stage renal disease: Secondary | ICD-10-CM | POA: Diagnosis not present

## 2024-03-07 LAB — CBC
HCT: 34.1 % — ABNORMAL LOW (ref 39.0–52.0)
Hemoglobin: 10.9 g/dL — ABNORMAL LOW (ref 13.0–17.0)
MCH: 30.3 pg (ref 26.0–34.0)
MCHC: 32 g/dL (ref 30.0–36.0)
MCV: 94.7 fL (ref 80.0–100.0)
Platelets: 219 K/uL (ref 150–400)
RBC: 3.6 MIL/uL — ABNORMAL LOW (ref 4.22–5.81)
RDW: 14.2 % (ref 11.5–15.5)
WBC: 4.4 K/uL (ref 4.0–10.5)
nRBC: 0 % (ref 0.0–0.2)

## 2024-03-07 LAB — TROPONIN I (HIGH SENSITIVITY): Troponin I (High Sensitivity): 27 ng/L — ABNORMAL HIGH (ref <18)

## 2024-03-07 LAB — BASIC METABOLIC PANEL WITH GFR
Anion gap: 22 — ABNORMAL HIGH (ref 5–15)
BUN: 22 mg/dL — ABNORMAL HIGH (ref 6–20)
CO2: 27 mmol/L (ref 22–32)
Calcium: 9.1 mg/dL (ref 8.9–10.3)
Chloride: 92 mmol/L — ABNORMAL LOW (ref 98–111)
Creatinine, Ser: 10.58 mg/dL — ABNORMAL HIGH (ref 0.61–1.24)
GFR, Estimated: 6 mL/min — ABNORMAL LOW (ref >60)
Glucose, Bld: 113 mg/dL — ABNORMAL HIGH (ref 70–99)
Potassium: 3.5 mmol/L (ref 3.5–5.1)
Sodium: 141 mmol/L (ref 135–145)

## 2024-03-07 LAB — HEPATIC FUNCTION PANEL
ALT: 11 U/L (ref 0–44)
AST: 14 U/L — ABNORMAL LOW (ref 15–41)
Albumin: 4.6 g/dL (ref 3.5–5.0)
Alkaline Phosphatase: 84 U/L (ref 38–126)
Bilirubin, Direct: 0.3 mg/dL — ABNORMAL HIGH (ref 0.0–0.2)
Indirect Bilirubin: 1.7 mg/dL — ABNORMAL HIGH (ref 0.3–0.9)
Total Bilirubin: 2 mg/dL — ABNORMAL HIGH (ref 0.0–1.2)
Total Protein: 8.3 g/dL — ABNORMAL HIGH (ref 6.5–8.1)

## 2024-03-07 LAB — MAGNESIUM: Magnesium: 2.4 mg/dL (ref 1.7–2.4)

## 2024-03-07 LAB — LIPASE, BLOOD: Lipase: 22 U/L (ref 11–51)

## 2024-03-07 MED ORDER — PANTOPRAZOLE SODIUM 40 MG IV SOLR
40.0000 mg | Freq: Once | INTRAVENOUS | Status: AC
  Administered 2024-03-07: 40 mg via INTRAVENOUS
  Filled 2024-03-07: qty 10

## 2024-03-07 MED ORDER — AMLODIPINE BESYLATE 5 MG PO TABS: 10.0000 mg | ORAL_TABLET | Freq: Every day | ORAL | 2 refills | Status: AC

## 2024-03-07 MED ORDER — HALOPERIDOL LACTATE 5 MG/ML IJ SOLN: 5.0000 mg | Freq: Once | INTRAMUSCULAR | Status: DC

## 2024-03-07 MED ORDER — KETOROLAC TROMETHAMINE 15 MG/ML IJ SOLN
15.0000 mg | Freq: Once | INTRAMUSCULAR | Status: AC
  Administered 2024-03-07: 15 mg via INTRAVENOUS
  Filled 2024-03-07: qty 1

## 2024-03-07 MED ORDER — HALOPERIDOL LACTATE 5 MG/ML IJ SOLN
2.5000 mg | Freq: Once | INTRAMUSCULAR | Status: AC
  Administered 2024-03-07: 2.5 mg via INTRAVENOUS
  Filled 2024-03-07: qty 1

## 2024-03-07 MED ORDER — SODIUM CHLORIDE 0.9 % IV BOLUS
1000.0000 mL | Freq: Once | INTRAVENOUS | Status: AC
  Administered 2024-03-07: 1000 mL via INTRAVENOUS

## 2024-03-07 MED ORDER — HYDRALAZINE HCL 20 MG/ML IJ SOLN
10.0000 mg | Freq: Once | INTRAMUSCULAR | Status: AC
  Administered 2024-03-07: 10 mg via INTRAVENOUS
  Filled 2024-03-07: qty 1

## 2024-03-07 NOTE — Discharge Instructions (Addendum)
 Increase amlodipine  to 10mg  daily to better control your blood pressure. Take over the counter famotidine  20mg  two times a day to help control stomach pain symptoms.

## 2024-03-07 NOTE — ED Notes (Addendum)
 Called pt guardian, Jeison Delpilar, to inform that pt is ready for discharge. She was displeased with the fact that he's being released because she believes he is too sick. I informed her that this is a chronic problem and his diagnostics did not warrant him staying. She asked for the provider to call her back. Informed Dr. Viviann.

## 2024-03-07 NOTE — ED Provider Notes (Signed)
 [facility] Provider Note    Event Date/Time   First MD Initiated Contact with Patient 03/07/24 [phone]     (approximate)   History   Chief Complaint: Abdominal Pain   HPI  Jonathan Mcmillan is a 28 y.o. male with history of heart failure reduced ejection fraction, paroxysmal atrial fibrillation, non-insulin -dependent diabetes mellitus type 1, history of PEA arrest, end-stage renal disease on hemodialysis, who comes ED complaining of epigastric pain, nonradiating, burning, associate with nausea and vomiting.  No constipation diarrhea or fever.   Outside records reviewed, discharge summary from February 12, 2024 noting similar presentation with epigastric pain, gastroparesis, discharged after 1 day with symptomatic improvement.     Past Medical History:  Diagnosis Date   Anemia of chronic renal failure    Anoxic encephalopathy (HCC)    Anxiety    Aortic atherosclerosis    Cannabinoid hyperemesis syndrome    Cardiac arrest (HCC) 06/01/2021   a.) PEA arrest in the setting of metabolic derangements; required intubation and CRRT.   Cardiomyopathy (HCC) 06/01/2021   a.) TTE 06/01/2021: EF 35-40%.   CHF (congestive heart failure) (HCC)    a.) TTE 06/01/2021: EF 35-40%; global HK, mild LA enlargement; GLS -11.3%; ; mild MR/AR; G2DD. b.) TTE 06/05/2021: EF 35-40%; PASP 31.6 mmHg; mild MR, mild-mod TR.   COVID-19 05/22/2020   Depression    Diabetes 1.5, managed as type 1 (HCC)    DKA (diabetic ketoacidoses) 05/21/2016   ESRD (end stage renal disease) on dialysis (HCC)    Gastroparesis    GERD (gastroesophageal reflux disease)    History of 2019 novel coronavirus disease (COVID-19) 05/22/2021   HTN (hypertension)    Intractable nausea and vomiting 04/30/2022   Marijuana use    Nicotine  dependence    PAF (paroxysmal atrial fibrillation) (HCC)    a.) CHA2DS2-VASc = 4 (CHF, HTN, aortic plaque, T1DM). b.) rate/rhythm maintained on oral carvedilol  + amiodarone ; no  chronic anticoagulation due to anemia and thrombocytopenia   Perirectal abscess 06/16/2017   Right arm cellulitis 01/26/2018   Thrombocytopenia     Current Outpatient Rx   Order #: [id number] Class: Normal   Order #: [id number] Class: Normal   Order #: [id number] Class: Normal   Order #: [id number] Class: Normal   Order #: [id number] Class: Normal   Order #: [id number] Class: Normal   Order #: [id number] Class: Normal   Order #: [id number] Class: Normal   Order #: [id number] Class: Normal   Order #: [id number] Class: Normal   Order #: [id number] Class: Normal    Past Surgical History:  Procedure Laterality Date   AMPUTATION TOE Right 08/25/2020   partial 5th toe   AV FISTULA PLACEMENT Left 09/26/2021   Procedure: INSERTION OF ARTERIOVENOUS (AV) GORE-TEX GRAFT ARM (BRACHIAL AXILLARY );  Surgeon: Marea Selinda RAMAN, MD;  Location: [facility] ORS;  Service: Vascular;  Laterality: Left;   COLONOSCOPY WITH PROPOFOL  N/A 06/21/2021   Procedure: COLONOSCOPY WITH PROPOFOL ;  Surgeon: Unk Corinn Skiff, MD;  Location: [facility] ENDOSCOPY;  Service: Gastroenterology;  Laterality: N/A;   DEBRIDEMENT  FOOT Right 11/01/2019   wound   DIALYSIS/PERMA CATHETER INSERTION N/A 06/15/2021   Procedure: DIALYSIS/PERMA CATHETER INSERTION;  Surgeon: Marea Selinda RAMAN, MD;  Location: [facility] INVASIVE CV LAB;  Service: Cardiovascular;  Laterality: N/A;   DIALYSIS/PERMA CATHETER INSERTION N/A 06/19/2021   Procedure: DIALYSIS/PERMA CATHETER INSERTION;  Surgeon: Marea Selinda RAMAN, MD;  Location: [facility] INVASIVE CV LAB;  Service: Cardiovascular;  Laterality: N/A;   DIALYSIS/PERMA CATHETER REMOVAL N/A 11/21/2021   Procedure: DIALYSIS/PERMA CATHETER REMOVAL;  Surgeon: Jama Cordella MATSU, MD;  Location: [facility] INVASIVE CV LAB;  Service: Cardiovascular;  Laterality: N/A;   INCISION AND DRAINAGE PERIRECTAL ABSCESS N/A 06/16/2017   Procedure: IRRIGATION AND DEBRIDEMENT PERIRECTAL ABSCESS;  Surgeon: Wonda Charlie BRAVO, MD;  Location: [facility] ORS;  Service: General;   Laterality: N/A;   none      Physical Exam   Triage Vital Signs: ED Triage Vitals [03/07/24 0348]  Encounter Vitals Group     BP (!) 196/129     Girls Systolic BP Percentile      Girls Diastolic BP Percentile      Boys Systolic BP Percentile      Boys Diastolic BP Percentile      Pulse Rate 100     Resp 18     Temp 97.7 F (36.5 C)     Temp Source Oral     SpO2 100 %     Weight      Height      Head Circumference      Peak Flow      Pain Score 10     Pain Loc      Pain Education      Exclude from Growth Chart     Most recent vital signs: Vitals:   03/07/24 0435 03/07/24 0500  BP: (!) 215/124 (!) 184/111  Pulse: 95 92  Resp: 13 20  Temp:    SpO2: 100% 100%    General: Awake, no distress.  CV:  Good peripheral perfusion.  Regular rate rhythm Resp:  Normal effort.  Clear lungs Abd:  No distention.  Soft with epigastric tenderness Other:  Moist oral mucosa   ED Results / Procedures / Treatments   Labs (all labs ordered are listed, but only abnormal results are displayed) Labs Reviewed  BASIC METABOLIC PANEL WITH GFR - Abnormal; Notable for the following components:      Result Value   Chloride 92 (*)    Glucose, Bld 113 (*)    BUN 22 (*)    Creatinine, Ser 10.58 (*)    GFR, Estimated 6 (*)    Anion gap 22 (*)    All other components within normal limits  CBC - Abnormal; Notable for the following components:   RBC 3.60 (*)    Hemoglobin 10.9 (*)    HCT 34.1 (*)    All other components within normal limits  HEPATIC FUNCTION PANEL - Abnormal; Notable for the following components:   Total Protein 8.3 (*)    AST 14 (*)    Total Bilirubin 2.0 (*)    Bilirubin, Direct 0.3 (*)    Indirect Bilirubin 1.7 (*)    All other components within normal limits  TROPONIN I (HIGH SENSITIVITY) - Abnormal; Notable for the following components:   Troponin I (High Sensitivity) 27 (*)    All other components within normal limits  MAGNESIUM   LIPASE, BLOOD      EKG Interpreted by me Sinus rhythm rate of 96.  Normal axis.  Prolonged QTc of 527 ms.  Normal QRS ST segments and T waves.   RADIOLOGY Chest x-ray interpreted by me, unremarkable.  Radiology report reviewed   PROCEDURES:  Procedures   MEDICATIONS ORDERED IN ED: Medications  sodium chloride  0.9 % bolus 1,000 mL (1,000 mLs Intravenous New Bag/Given 03/07/24 0448)  ketorolac  (TORADOL ) 15 MG/ML injection 15 mg (15 mg Intravenous Given 03/07/24 0451)  pantoprazole  (PROTONIX ) injection 40 mg (40 mg Intravenous Given 03/07/24 0452)  haloperidol  lactate (HALDOL ) injection  2.5 mg (2.5 mg Intravenous Given 03/07/24 0451)  hydrALAZINE  (APRESOLINE ) injection 10 mg (10 mg Intravenous Given 03/07/24 0516)     IMPRESSION / MDM / ASSESSMENT AND PLAN / ED COURSE  I reviewed the triage vital signs and the nursing notes.  DDx: GERD, gastroparesis, pancreatitis, electrolyte derangement, cannabinoid hyperemesis syndrome.  Doubt mesenteric ischemia, dissection, aneurysm, biliary disease  Patient's presentation is most consistent with acute presentation with potential threat to life or bodily function.  Patient presents with recurrent epigastric pain, smells of marijuana.  Will give IV fluids, medications for supportive care.   ----------------------------------------- 6:32 AM on 03/07/2024 ----------------------------------------- Feeling better.  Tolerating oral intake.  Stable for discharge.      FINAL CLINICAL IMPRESSION(S) / ED DIAGNOSES   Final diagnoses:  Epigastric pain  ESRD on hemodialysis (HCC)  Type 1 diabetes mellitus without complications (HCC)  Uncontrolled hypertension     Rx / DC Orders   ED Discharge Orders          Ordered    amLODipine  (NORVASC ) 5 MG tablet  Daily        03/07/24 0631             Note:  This document was prepared using Dragon voice recognition software and may include unintentional dictation errors.   Viviann Pastor,  MD 03/07/24 [phone]

## 2024-03-07 NOTE — ED Triage Notes (Signed)
 Pt presents for epigastric sharp, aching and burning pain. Endorsing nausea and vomiting. Denies known fevers.   Past Medical History:  Diagnosis Date   Anemia of chronic renal failure    Anoxic encephalopathy (HCC)    Anxiety    Aortic atherosclerosis    Cannabinoid hyperemesis syndrome    Cardiac arrest (HCC) 06/01/2021   a.) PEA arrest in the setting of metabolic derangements; required intubation and CRRT.   Cardiomyopathy (HCC) 06/01/2021   a.) TTE 06/01/2021: EF 35-40%.   CHF (congestive heart failure) (HCC)    a.) TTE 06/01/2021: EF 35-40%; global HK, mild LA enlargement; GLS -11.3%; ; mild MR/AR; G2DD. b.) TTE 06/05/2021: EF 35-40%; PASP 31.6 mmHg; mild MR, mild-mod TR.   COVID-19 05/22/2020   Depression    Diabetes 1.5, managed as type 1 (HCC)    DKA (diabetic ketoacidoses) 05/21/2016   ESRD (end stage renal disease) on dialysis (HCC)    Gastroparesis    GERD (gastroesophageal reflux disease)    History of 2019 novel coronavirus disease (COVID-19) 05/22/2021   HTN (hypertension)    Intractable nausea and vomiting 04/30/2022   Marijuana use    Nicotine  dependence    PAF (paroxysmal atrial fibrillation) (HCC)    a.) CHA2DS2-VASc = 4 (CHF, HTN, aortic plaque, T1DM). b.) rate/rhythm maintained on oral carvedilol  + amiodarone ; no chronic anticoagulation due to anemia and thrombocytopenia   Perirectal abscess 06/16/2017   Right arm cellulitis 01/26/2018   Thrombocytopenia

## 2024-03-09 ENCOUNTER — Encounter
Admission: RE | Disposition: A | Discharge status: Home / Self Care | Payer: Self-pay | Source: Home / Self Care | Attending: Vascular Surgery

## 2024-03-09 ENCOUNTER — Ambulatory Visit
Admission: RE | Admit: 2024-03-09 | Discharge: 2024-03-09 | Disposition: A | Discharge status: Home / Self Care | Attending: Vascular Surgery | Admitting: Vascular Surgery

## 2024-03-09 DIAGNOSIS — Z992 Dependence on renal dialysis: Secondary | ICD-10-CM | POA: Insufficient documentation

## 2024-03-09 DIAGNOSIS — T8249XA Other complication of vascular dialysis catheter, initial encounter: Secondary | ICD-10-CM | POA: Insufficient documentation

## 2024-03-09 DIAGNOSIS — E1022 Type 1 diabetes mellitus with diabetic chronic kidney disease: Secondary | ICD-10-CM | POA: Insufficient documentation

## 2024-03-09 DIAGNOSIS — F17210 Nicotine dependence, cigarettes, uncomplicated: Secondary | ICD-10-CM | POA: Insufficient documentation

## 2024-03-09 DIAGNOSIS — N186 End stage renal disease: Secondary | ICD-10-CM | POA: Diagnosis not present

## 2024-03-09 DIAGNOSIS — I509 Heart failure, unspecified: Secondary | ICD-10-CM | POA: Diagnosis not present

## 2024-03-09 DIAGNOSIS — Y839 Surgical procedure, unspecified as the cause of abnormal reaction of the patient, or of later complication, without mention of misadventure at the time of the procedure: Secondary | ICD-10-CM | POA: Diagnosis not present

## 2024-03-09 DIAGNOSIS — Z79899 Other long term (current) drug therapy: Secondary | ICD-10-CM | POA: Insufficient documentation

## 2024-03-09 DIAGNOSIS — I132 Hypertensive heart and chronic kidney disease with heart failure and with stage 5 chronic kidney disease, or end stage renal disease: Secondary | ICD-10-CM | POA: Insufficient documentation

## 2024-03-09 DIAGNOSIS — Z452 Encounter for adjustment and management of vascular access device: Secondary | ICD-10-CM

## 2024-03-09 HISTORY — PX: DIALYSIS/PERMA CATHETER REMOVAL: CATH118289

## 2024-03-09 SURGERY — DIALYSIS/PERMA CATHETER REMOVAL
Anesthesia: LOCAL

## 2024-03-09 MED ORDER — LIDOCAINE-EPINEPHRINE (PF) 1 %-1:200000 IJ SOLN
INTRAMUSCULAR | Status: DC | PRN
  Administered 2024-03-09: 20 mL

## 2024-03-09 MED ORDER — AMLODIPINE BESYLATE 5 MG PO TABS
ORAL_TABLET | ORAL | Status: AC
Start: 2024-03-09 — End: 2024-03-09
  Filled 2024-03-09: qty 2

## 2024-03-09 MED ORDER — AMLODIPINE BESYLATE 5 MG PO TABS
10.0000 mg | ORAL_TABLET | Freq: Once | ORAL | Status: AC
  Administered 2024-03-09: 10 mg via ORAL

## 2024-03-09 SURGICAL SUPPLY — 4 items
CHLORAPREP W/TINT 26 (MISCELLANEOUS) IMPLANT
FORCEPS HALSTEAD CVD 5IN STRL (INSTRUMENTS) IMPLANT
SCALPEL PROTECTED #11 DISP (BLADE) IMPLANT
TRAY LACERAT/PLASTIC (MISCELLANEOUS) IMPLANT

## 2024-03-09 NOTE — Progress Notes (Signed)
 Bp post procedure 223/106 pre procedure 176/110 pt states he has bp med at home but not currently taking and doesn't remember last dose.  Dr. Marea made aware of the above and ordered amlodipine  10mg  po.  Encouraged patient to stay for for bp recheck but he refused.  Dr Marea notified of the same.  Patient left and encouraged him to take his blood pressure as prescribed.  Explained risks of bp med noncompliance including stroke, heart damage, etc.

## 2024-03-09 NOTE — Discharge Instructions (Signed)
Tunneled Catheter Removal, Care After Refer to this sheet in the next few weeks. These instructions provide you with information about caring for yourself after your procedure. Your health care provider may also give you more specific instructions. Your treatment has been planned according to current medical practices, but problems sometimes occur. Call your health care provider if you have any problems or questions after your procedure. What can I expect after the procedure? After the procedure, it is common to have: Some mild redness, swelling, and pain around your catheter site.   Follow these instructions at home: Incision care  Check your removal site  every day for signs of infection. Check for: More redness, swelling, or pain. More fluid or blood. Warmth. Pus or a bad smell. Remove your dressing in 48hrs leave open to air  Activity  Return to your normal activities as told by your health care provider. Ask your health care provider what activities are safe for you. Do not lift anything that is heavier than 10 lb (4.5 kg) for 3 days  You may shower tomorrow  Contact a health care provider if: You have more fluid or blood coming from your removal site You have more redness, swelling, or pain at your incisions or around the area where your catheter was removed Your removal site feel warm to the touch. You feel unusually weak. You feel nauseous.. Get help right away if You have swelling in your arm, shoulder, neck, or face. You develop chest pain. You have difficulty breathing. You feel dizzy or light-headed. You have pus or a bad smell coming from your removal site You have a fever. You develop bleeding from your removal site, and your bleeding does not stop. This information is not intended to replace advice given to you by your health care provider. Make sure you discuss any questions you have with your health care provider. Document Released: 04/16/2012 Document Revised:  01/01/2016 Document Reviewed: 01/24/2015 Elsevier Interactive Patient Education  2017 [employer]Tunneled Catheter Removal, Care After Refer to this sheet in the next few weeks. These instructions provide you with information about caring for yourself after your procedure. Your health care provider may also give you more specific instructions. Your treatment has been planned according to current medical practices, but problems sometimes occur. Call your health care provider if you have any problems or questions after your procedure. What can I expect after the procedure? After the procedure, it is common to have: Some mild redness, swelling, and pain around your catheter site.   Follow these instructions at home: Incision care  Check your removal site  every day for signs of infection. Check for: More redness, swelling, or pain. More fluid or blood. Warmth. Pus or a bad smell. Remove your dressing in 48hrs leave open to air  Activity  Return to your normal activities as told by your health care provider. Ask your health care provider what activities are safe for you. Do not lift anything that is heavier than 10 lb (4.5 kg) for 3 days  You may shower tomorrow  Contact a health care provider if: You have more fluid or blood coming from your removal site You have more redness, swelling, or pain at your incisions or around the area where your catheter was removed Your removal site feel warm to the touch. You feel unusually weak. You feel nauseous.. Get help right away if You have swelling in your arm, shoulder, neck, or face. You develop chest pain. You have difficulty breathing.  You feel dizzy or light-headed. You have pus or a bad smell coming from your removal site You have a fever. You develop bleeding from your removal site, and your bleeding does not stop. This information is not intended to replace advice given to you by your health care provider. Make sure you discuss any  questions you have with your health care provider. Document Released: 04/16/2012 Document Revised: 01/01/2016 Document Reviewed: 01/24/2015 Elsevier Interactive Patient Education  2017 [employer].

## 2024-03-09 NOTE — Op Note (Signed)
 Operative Note     Preoperative diagnosis:   1. ESRD with functional permanent access  Postoperative diagnosis:  1. ESRD with functional permanent access  Procedure:  Removal of right jugular Permcath  Surgeon:  Selinda Gu, MD  Anesthesia:  Local  EBL:  Minimal  Indication for the Procedure:  The patient has a functional permanent dialysis access and no longer needs their permcath.  This can be removed.  Risks and benefits are discussed and informed consent is obtained.  Description of the Procedure:  The patient's right neck, chest and existing catheter were sterilely prepped and draped. The area around the catheter was anesthetized copiously with 1% lidocaine . The catheter was dissected out with curved hemostats until the cuff was freed from the surrounding fibrous sheath. The fiber sheath was transected, and the catheter was then removed in its entirety using gentle traction. Pressure was held and sterile dressings were placed. The patient tolerated the procedure well and was taken to the recovery room in stable condition.     Selinda Gu  03/09/2024, 3:19 PM This note was created with Dragon Medical transcription system. Any errors in dictation are purely unintentional.

## 2024-03-09 NOTE — H&P (Signed)
 [facility] Admission History & Physical  MRN : [id number]  Jonathan Mcmillan is a 28 y.o. (16-Jun-1995) male who presents with chief complaint of No chief complaint on file. SABRA  History of Present Illness: I am asked to evaluate the patient by the dialysis center. The patient was sent here because they have a nonfunctioning tunneled catheter and a functioning permanent access.  The patient reports they're not been any problems with any of their dialysis runs. They are reporting good flows with good parameters at dialysis.   Patient denies pain or tenderness overlying the access.  There is no pain with dialysis.  The patient denies hand pain or finger pain consistent with steal syndrome.  No fevers or chills while on dialysis.    Current Facility-Administered Medications  Medication Dose Route Frequency Provider Last Rate Last Admin   lidocaine -EPINEPHrine  (PF) (XYLOCAINE -EPINEPHrine ) 1 %-1:200000 (PF) injection    PRN Marea Selinda RAMAN, MD   20 mL at 03/09/24 1514    Past Medical History:  Diagnosis Date   Anemia of chronic renal failure    Anoxic encephalopathy (HCC)    Anxiety    Aortic atherosclerosis    Cannabinoid hyperemesis syndrome    Cardiac arrest (HCC) 06/01/2021   a.) PEA arrest in the setting of metabolic derangements; required intubation and CRRT.   Cardiomyopathy (HCC) 06/01/2021   a.) TTE 06/01/2021: EF 35-40%.   CHF (congestive heart failure) (HCC)    a.) TTE 06/01/2021: EF 35-40%; global HK, mild LA enlargement; GLS -11.3%; ; mild MR/AR; G2DD. b.) TTE 06/05/2021: EF 35-40%; PASP 31.6 mmHg; mild MR, mild-mod TR.   COVID-19 05/22/2020   Depression    Diabetes 1.5, managed as type 1 (HCC)    DKA (diabetic ketoacidoses) 05/21/2016   ESRD (end stage renal disease) on dialysis (HCC)    Gastroparesis    GERD (gastroesophageal reflux disease)    History of 2019 novel coronavirus disease (COVID-19) 05/22/2021   HTN (hypertension)    Intractable nausea  and vomiting 04/30/2022   Marijuana use    Nicotine  dependence    PAF (paroxysmal atrial fibrillation) (HCC)    a.) CHA2DS2-VASc = 4 (CHF, HTN, aortic plaque, T1DM). b.) rate/rhythm maintained on oral carvedilol  + amiodarone ; no chronic anticoagulation due to anemia and thrombocytopenia   Perirectal abscess 06/16/2017   Right arm cellulitis 01/26/2018   Thrombocytopenia     Past Surgical History:  Procedure Laterality Date   AMPUTATION TOE Right 08/25/2020   partial 5th toe   AV FISTULA PLACEMENT Left 09/26/2021   Procedure: INSERTION OF ARTERIOVENOUS (AV) GORE-TEX GRAFT ARM (BRACHIAL AXILLARY );  Surgeon: Marea Selinda RAMAN, MD;  Location: [facility];  Service: Vascular;  Laterality: Left;   COLONOSCOPY WITH PROPOFOL  N/A 06/21/2021   Procedure: COLONOSCOPY WITH PROPOFOL ;  Surgeon: Unk Corinn Skiff, MD;  Location: [facility];  Service: Gastroenterology;  Laterality: N/A;   DEBRIDEMENT  FOOT Right 11/01/2019   wound   DIALYSIS/PERMA CATHETER INSERTION N/A 06/15/2021   Procedure: DIALYSIS/PERMA CATHETER INSERTION;  Surgeon: Marea Selinda RAMAN, MD;  Location: [facility];  Service: Cardiovascular;  Laterality: N/A;   DIALYSIS/PERMA CATHETER INSERTION N/A 06/19/2021   Procedure: DIALYSIS/PERMA CATHETER INSERTION;  Surgeon: Marea Selinda RAMAN, MD;  Location: [facility];  Service: Cardiovascular;  Laterality: N/A;   DIALYSIS/PERMA CATHETER REMOVAL N/A 11/21/2021   Procedure: DIALYSIS/PERMA CATHETER REMOVAL;  Surgeon: Jama Cordella MATSU, MD;  Location: [facility];  Service: Cardiovascular;  Laterality: N/A;  INCISION AND DRAINAGE PERIRECTAL ABSCESS N/A 06/16/2017   Procedure: IRRIGATION AND DEBRIDEMENT PERIRECTAL ABSCESS;  Surgeon: Wonda Charlie BRAVO, MD;  Location: [facility];  Service: General;  Laterality: N/A;   none      Social History Social History   Tobacco Use   Smoking status: Every Day    Current packs/day: 0.50    Types: Cigarettes   Smokeless tobacco: Never   Vaping Use   Vaping status: Never Used  Substance Use Topics   Alcohol  use: No   Drug use: Yes    Types: Marijuana    Comment: + cocaine UDS 08/25/2020; negatives after    Family History Family History  Problem Relation Age of Onset   Diabetes Mother     No family history of bleeding or clotting disorders, autoimmune disease or porphyria  No Known Allergies   REVIEW OF SYSTEMS (Negative unless checked)  Constitutional: [] Weight loss  [] Fever  [] Chills Cardiac: [] Chest pain   [] Chest pressure   [] Palpitations   [] Shortness of breath when laying flat   [] Shortness of breath at rest   [x] Shortness of breath with exertion. Vascular:  [] Pain in legs with walking   [] Pain in legs at rest   [] Pain in legs when laying flat   [] Claudication   [] Pain in feet when walking  [] Pain in feet at rest  [] Pain in feet when laying flat   [] History of DVT   [] Phlebitis   [] Swelling in legs   [] Varicose veins   [] Non-healing ulcers Pulmonary:   [] Uses home oxygen   [] Productive cough   [] Hemoptysis   [] Wheeze  [] COPD   [] Asthma Neurologic:  [] Dizziness  [] Blackouts   [] Seizures   [] History of stroke   [] History of TIA  [] Aphasia   [] Temporary blindness   [] Dysphagia   [] Weakness or numbness in arms   [] Weakness or numbness in legs Musculoskeletal:  [] Arthritis   [] Joint swelling   [] Joint pain   [] Low back pain Hematologic:  [] Easy bruising  [] Easy bleeding   [] Hypercoagulable state   [] Anemic  [] Hepatitis Gastrointestinal:  [] Blood in stool   [] Vomiting blood  [] Gastroesophageal reflux/heartburn   [] Difficulty swallowing. Genitourinary:  [x] Chronic kidney disease   [] Difficult urination  [] Frequent urination  [] Burning with urination   [] Blood in urine Skin:  [] Rashes   [] Ulcers   [] Wounds Psychological:  [] History of anxiety   []  History of major depression.  Physical Examination  Vitals:   03/09/24 1441  BP: (!) 176/100  Pulse: 87  Resp: 17  Temp: 97.6 F (36.4 C)  TempSrc: Temporal  SpO2:  98%   There is no height or weight on file to calculate BMI. Gen: WD/WN, NAD Head: [place]/AT, No temporalis wasting. Prominent temp pulse not noted. Ear/Nose/Throat: Hearing grossly intact, nares w/o erythema or drainage, oropharynx w/o Erythema/Exudate,  Eyes: Conjunctiva clear, sclera non-icteric Neck: Trachea midline.  No JVD.  Pulmonary:  Good air movement, respirations not labored, no use of accessory muscles.  Cardiac: RRR, normal S1, S2. Vascular:  Vessel Right Left  Radial Palpable Palpable   Musculoskeletal: M/S 5/5 throughout.  Extremities without ischemic changes.  No deformity or atrophy.  Neurologic: Sensation grossly intact in extremities.  Symmetrical.  Speech is fluent. Motor exam as listed above. Psychiatric: Judgment intact, Mood & affect appropriate for pt's clinical situation. Dermatologic: No rashes or ulcers noted.  No cellulitis or open wounds.    CBC Lab Results  Component Value Date   WBC 4.4 03/07/2024   HGB 10.9 (L) 03/07/2024  HCT 34.1 (L) 03/07/2024   MCV 94.7 03/07/2024   PLT 219 03/07/2024    BMET    Component Value Date/Time   NA 141 03/07/2024 0353   NA 133 (L) 08/05/2013 1326   K 3.5 03/07/2024 0353   K 3.8 08/05/2013 1326   CL 92 (L) 03/07/2024 0353   CL 99 08/05/2013 1326   CO2 27 03/07/2024 0353   CO2 28 08/05/2013 1326   GLUCOSE 113 (H) 03/07/2024 0353   GLUCOSE 301 (H) 08/05/2013 1326   BUN 22 (H) 03/07/2024 0353   BUN 5 (L) 08/05/2013 1326   CREATININE 10.58 (H) 03/07/2024 0353   CREATININE 0.62 08/05/2013 1326   CALCIUM  9.1 03/07/2024 0353   CALCIUM  8.0 06/12/2020 0515   GFRNONAA 6 (L) 03/07/2024 0353   GFRNONAA >60 08/05/2013 1326   GFRAA 51 (L) 01/02/2020 1718   GFRAA >60 08/05/2013 1326   Estimated Creatinine Clearance: 10.2 mL/min (A) (by C-G formula based on SCr of 10.58 mg/dL (H)).  COAG Lab Results  Component Value Date   INR 1.3 (H) 06/04/2021   INR 2.3 (H) 06/01/2021   INR 1.3 (H) 05/31/2021     Radiology PERIPHERAL VASCULAR CATHETERIZATION Result Date: 03/09/2024 See surgical note for result.  DG Chest 2 View Result Date: 03/07/2024 EXAM: 2 VIEW(S) XRAY OF THE CHEST 03/07/2024 04:17:40 AM COMPARISON: 02/11/2024 CLINICAL HISTORY: Chest Pain. Abdominal pain. FINDINGS: LINES, TUBES AND DEVICES: Right IJ CVC in place with tip at superior cavoatrial junction. Partially visualized left upper extremity vascular stent noted. LUNGS AND PLEURA: No focal pulmonary opacity. No pulmonary edema. No pleural effusion. No pneumothorax. HEART AND MEDIASTINUM: No acute abnormality of the cardiac and mediastinal silhouettes. BONES AND SOFT TISSUES: No acute osseous abnormality. Paucity of bowel gas in the upper abdomen. IMPRESSION: 1. Stable Right internal jugular central venous catheter . 2. No acute cardiopulmonary abnormality. Electronically signed by: Helayne Hurst MD 03/07/2024 04:27 AM EDT RP Workstation: [id number]   DG Abd Portable 2V Result Date: 02/12/2024 CLINICAL DATA:  355246 Abdominal pain 355246. EXAM: PORTABLE ABDOMEN - 2 VIEW COMPARISON:  06/09/2021. FINDINGS: The bowel gas pattern is non-obstructive. No evidence of pneumoperitoneum. No acute osseous abnormalities. The soft tissues are within normal limits. Surgical changes, devices, tubes and lines: None. IMPRESSION: No acute abnormalities of the abdomen. Electronically Signed   By: Ree Molt M.D.   On: 02/12/2024 15:23   ECHOCARDIOGRAM COMPLETE Result Date: 02/12/2024    ECHOCARDIOGRAM REPORT   Patient Name:   Jonathan Mcmillan Date of Exam: 02/11/2024 Medical Rec #:  [id number]       Height:       69.0 in Accession #:    [id number]      Weight:       162.9 lb Date of Birth:  1995-07-29       BSA:          1.894 m Patient Age:    28 years        BP:           152/88 mmHg Patient Gender: M               HR:           91 bpm. Exam Location:  [facility] Procedure: 2D Echo, 3D Echo, Cardiac Doppler, Color Doppler and Strain Analysis            (Both  Spectral and Color Flow Doppler were utilized during  procedure). Indications:     Heart Failure with reduced Ejection Fraction  History:         Patient has prior history of Echocardiogram examinations, most                  recent 06/05/2021. Arrythmias:Paroxysmal Atrial Fibrillation;                  Risk Factors:Hypertension, Marijuanna Abuse. and Diabetes.                  Cardiac Arrest. Cardiomyopathy. Congestive heart failure. End                  Stage Renal Disease. COVID-19.  Sonographer:     Carl Coma RDCS Referring Phys:  [id number] RYAN M DUNN Diagnosing Phys: Lonni Hanson MD IMPRESSIONS  1. Left ventricular ejection fraction, by estimation, is 50 to 55%. The left ventricle has low normal function. The left ventricle has no regional wall motion abnormalities. There is severe left ventricular hypertrophy. Indeterminate diastolic filling due to E-A fusion. The average left ventricular global longitudinal strain is -16.4 %. The global longitudinal strain is normal.  2. Right ventricular systolic function is normal. The right ventricular size is normal.  3. The mitral valve is normal in structure. Trivial mitral valve regurgitation. No evidence of mitral stenosis.  4. The aortic valve is tricuspid. Aortic valve regurgitation is mild. No aortic stenosis is present.  5. The inferior vena cava is normal in size with greater than 50% respiratory variability, suggesting right atrial pressure of 3 mmHg. FINDINGS  Left Ventricle: Left ventricular ejection fraction, by estimation, is 50 to 55%. The left ventricle has low normal function. The left ventricle has no regional wall motion abnormalities. The average left ventricular global longitudinal strain is -16.4 %. Strain was performed and the global longitudinal strain is normal. 3D ejection fraction reviewed and evaluated as part of the interpretation. Alternate measurement of EF is felt to be most reflective of LV function. The left  ventricular internal cavity size was normal in size. There is severe left ventricular hypertrophy. Indeterminate diastolic filling due to E-A fusion. Right Ventricle: The right ventricular size is normal. No increase in right ventricular wall thickness. Right ventricular systolic function is normal. Left Atrium: Left atrial size was normal in size. Right Atrium: Right atrial size was normal in size. Pericardium: There is no evidence of pericardial effusion. Mitral Valve: The mitral valve is normal in structure. Trivial mitral valve regurgitation. No evidence of mitral valve stenosis. Tricuspid Valve: The tricuspid valve is normal in structure. Tricuspid valve regurgitation is trivial. The aortic valve is tricuspid. Aortic valve regurgitation is mild. No aortic stenosis is present. Pulmonic Valve: The pulmonic valve was grossly normal. Pulmonic valve regurgitation is not visualized. No evidence of pulmonic stenosis. Aorta: The aortic root and ascending aorta are structurally normal, with no evidence of dilitation. Pulmonary Artery: The pulmonary artery is not well seen. Venous: The inferior vena cava is normal in size with greater than 50% respiratory variability, suggesting right atrial pressure of 3 mmHg. IAS/Shunts: The interatrial septum was not well visualized.  LEFT VENTRICLE PLAX 2D LVIDd:         4.30 cm   Diastology LVIDs:         3.00 cm   LV e' medial:    5.77 cm/s LV PW:         1.72 cm   LV E/e' medial:  10.7 LV IVS:  1.56 cm   LV e' lateral:   7.65 cm/s LVOT diam:     2.20 cm   LV E/e' lateral: 8.1 LV SV:         58 LV SV Index:   31        2D Longitudinal Strain LVOT Area:     3.80 cm  2D Strain GLS (A4C):   -16.1 %                          2D Strain GLS (A3C):   -16.5 %                          2D Strain GLS (A2C):   -16.7 %                          2D Strain GLS Avg:     -16.4 %                           3D Volume EF:                          3D EF:        35 %                          LV EDV:        206 ml                          LV ESV:       135 ml                          LV SV:        71 ml RIGHT VENTRICLE             IVC RV Basal diam:  3.30 cm     IVC diam: 1.50 cm RV S prime:     10.01 cm/s TAPSE (M-mode): 2.0 cm LEFT ATRIUM             Index        RIGHT ATRIUM           Index LA diam:        3.70 cm 1.95 cm/m   RA Area:     11.80 cm LA Vol (A2C):   58.7 ml 31.00 ml/m  RA Volume:   26.50 ml  13.99 ml/m LA Vol (A4C):   32.2 ml 17.00 ml/m LA Biplane Vol: 45.0 ml 23.76 ml/m  AORTIC VALVE LVOT Vmax:   99.70 cm/s LVOT Vmean:  64.167 cm/s LVOT VTI:    0.152 m AI PHT:      232 msec  AORTA Ao Root diam: 3.30 cm Ao Asc diam:  2.90 cm MITRAL VALVE MV Area (PHT): 4.89 cm    SHUNTS MV Decel Time: 155 msec    Systemic VTI:  0.15 m MV E velocity: 61.80 cm/s  Systemic Diam: 2.20 cm MV A velocity: 79.70 cm/s MV E/A ratio:  0.78 Lonni End MD Electronically signed by Lonni Hanson MD Signature Date/Time: 02/12/2024/7:43:21 AM    Final    DG Chest Port 1 View Result Date: 02/11/2024 CLINICAL DATA:  Nausea and vomiting. EXAM: PORTABLE  CHEST 1 VIEW COMPARISON:  August 12, 2022 FINDINGS: The heart size and mediastinal contours are within normal limits. Both lungs are clear. Chronic seventh and eighth right rib fractures are seen. The visualized skeletal structures are unremarkable. IMPRESSION: No active cardiopulmonary disease. Electronically Signed   By: Suzen Dials M.D.   On: 02/11/2024 21:26   US  ABDOMEN LIMITED RUQ (LIVER/GB) Result Date: 02/11/2024 CLINICAL DATA:  Transaminitis, acute abdominal pain. EXAM: ULTRASOUND ABDOMEN LIMITED RIGHT UPPER QUADRANT COMPARISON:  August 23, 2022. FINDINGS: Gallbladder: No gallstones or wall thickening visualized. No sonographic Murphy sign noted by sonographer. Common bile duct: Diameter: 5 mm which is within normal limits. Liver: No focal lesion identified. Within normal limits in parenchymal echogenicity. Portal vein is patent on color Doppler imaging  with normal direction of blood flow towards the liver. Other: None. IMPRESSION: No definite abnormality seen in the right upper quadrant of the abdomen. Electronically Signed   By: Lynwood Landy Raddle M.D.   On: 02/11/2024 16:28    Assessment/Plan 1.  Complication dialysis device:  Patient's Tunneled catheter is malfunctioning. The patient has an extremity access that is functioning well. Therefore, the patient will undergo removal of the tunneled catheter under local anesthesia.  The risks and benefits were described to the patient.  All questions were answered.  The patient agrees to proceed with angiography and intervention. Potassium will be drawn to ensure that it is an appropriate level prior to performing intervention. 2.  End-stage renal disease requiring hemodialysis:  Patient will continue dialysis therapy without further interruption 3.  Hypertension:  Patient will continue medical management; nephrology is following no changes in oral medications. 4. Diabetes mellitus:  Glucose will be monitored and oral medications been held this morning once the patient has undergone the patient's procedure po intake will be reinitiated and again Accu-Cheks will be used to assess the blood glucose level and treat as needed. The patient will be restarted on the patient's usual hypoglycemic regime     Selinda Gu, MD  03/09/2024 3:17 PM

## 2024-03-10 ENCOUNTER — Encounter: Payer: Self-pay | Admitting: Vascular Surgery
